# Patient Record
Sex: Female | Born: 1940 | Race: White | Hispanic: No | Marital: Married | Smoking: Never smoker
Health system: Southern US, Community
[De-identification: ages and names within clinical notes are randomized; demographics above are authoritative.]

## PROBLEM LIST (undated history)

## (undated) DIAGNOSIS — M0579 Rheumatoid arthritis with rheumatoid factor of multiple sites without organ or systems involvement: Secondary | ICD-10-CM

## (undated) DIAGNOSIS — E119 Type 2 diabetes mellitus without complications: Secondary | ICD-10-CM

## (undated) DIAGNOSIS — I499 Cardiac arrhythmia, unspecified: Secondary | ICD-10-CM

## (undated) DIAGNOSIS — K56609 Unspecified intestinal obstruction, unspecified as to partial versus complete obstruction: Secondary | ICD-10-CM

## (undated) DIAGNOSIS — H409 Unspecified glaucoma: Secondary | ICD-10-CM

## (undated) DIAGNOSIS — I700 Atherosclerosis of aorta: Secondary | ICD-10-CM

## (undated) DIAGNOSIS — R112 Nausea with vomiting, unspecified: Secondary | ICD-10-CM

## (undated) DIAGNOSIS — N1832 Chronic kidney disease, stage 3b: Secondary | ICD-10-CM

## (undated) DIAGNOSIS — J189 Pneumonia, unspecified organism: Secondary | ICD-10-CM

## (undated) DIAGNOSIS — Z9989 Dependence on other enabling machines and devices: Secondary | ICD-10-CM

## (undated) DIAGNOSIS — N184 Chronic kidney disease, stage 4 (severe): Secondary | ICD-10-CM

## (undated) DIAGNOSIS — J45909 Unspecified asthma, uncomplicated: Secondary | ICD-10-CM

## (undated) DIAGNOSIS — N2581 Secondary hyperparathyroidism of renal origin: Secondary | ICD-10-CM

## (undated) DIAGNOSIS — M519 Unspecified thoracic, thoracolumbar and lumbosacral intervertebral disc disorder: Secondary | ICD-10-CM

## (undated) DIAGNOSIS — N1330 Unspecified hydronephrosis: Secondary | ICD-10-CM

## (undated) DIAGNOSIS — K219 Gastro-esophageal reflux disease without esophagitis: Secondary | ICD-10-CM

## (undated) DIAGNOSIS — R0600 Dyspnea, unspecified: Secondary | ICD-10-CM

## (undated) DIAGNOSIS — Z978 Presence of other specified devices: Secondary | ICD-10-CM

## (undated) DIAGNOSIS — Z9889 Other specified postprocedural states: Secondary | ICD-10-CM

## (undated) DIAGNOSIS — D649 Anemia, unspecified: Secondary | ICD-10-CM

## (undated) DIAGNOSIS — I4510 Unspecified right bundle-branch block: Secondary | ICD-10-CM

## (undated) DIAGNOSIS — M1990 Unspecified osteoarthritis, unspecified site: Secondary | ICD-10-CM

## (undated) DIAGNOSIS — Z974 Presence of external hearing-aid: Secondary | ICD-10-CM

## (undated) DIAGNOSIS — E785 Hyperlipidemia, unspecified: Secondary | ICD-10-CM

## (undated) DIAGNOSIS — E538 Deficiency of other specified B group vitamins: Secondary | ICD-10-CM

## (undated) DIAGNOSIS — N189 Chronic kidney disease, unspecified: Secondary | ICD-10-CM

## (undated) DIAGNOSIS — R339 Retention of urine, unspecified: Secondary | ICD-10-CM

## (undated) DIAGNOSIS — I10 Essential (primary) hypertension: Secondary | ICD-10-CM

## (undated) DIAGNOSIS — T8859XA Other complications of anesthesia, initial encounter: Secondary | ICD-10-CM

## (undated) DIAGNOSIS — C801 Malignant (primary) neoplasm, unspecified: Secondary | ICD-10-CM

## (undated) HISTORY — PX: LUMBAR LAMINECTOMY: SHX95

## (undated) HISTORY — PX: ABDOMINAL HYSTERECTOMY: SHX81

## (undated) HISTORY — PX: KNEE ARTHROSCOPY: SUR90

## (undated) HISTORY — PX: JOINT REPLACEMENT: SHX530

## (undated) HISTORY — PX: FRACTURE SURGERY: SHX138

## (undated) HISTORY — PX: COLON SURGERY: SHX602

## (undated) HISTORY — PX: EYE SURGERY: SHX253

## (undated) HISTORY — DX: Unspecified asthma, uncomplicated: J45.909

## (undated) HISTORY — PX: BACK SURGERY: SHX140

## (undated) HISTORY — DX: Gastro-esophageal reflux disease without esophagitis: K21.9

---

## 1994-10-01 DIAGNOSIS — C189 Malignant neoplasm of colon, unspecified: Secondary | ICD-10-CM

## 1994-10-01 DIAGNOSIS — C801 Malignant (primary) neoplasm, unspecified: Secondary | ICD-10-CM

## 1994-10-01 HISTORY — DX: Malignant (primary) neoplasm, unspecified: C80.1

## 1994-10-01 HISTORY — DX: Malignant neoplasm of colon, unspecified: C18.9

## 2004-10-01 HISTORY — PX: BACK SURGERY: SHX140

## 2004-10-12 ENCOUNTER — Ambulatory Visit: Payer: Self-pay | Admitting: Internal Medicine

## 2005-10-30 ENCOUNTER — Ambulatory Visit: Payer: Self-pay | Admitting: Internal Medicine

## 2006-11-05 ENCOUNTER — Ambulatory Visit: Payer: Self-pay | Admitting: Internal Medicine

## 2007-04-09 ENCOUNTER — Ambulatory Visit: Payer: Self-pay | Admitting: Unknown Physician Specialty

## 2007-06-24 ENCOUNTER — Other Ambulatory Visit: Payer: Self-pay

## 2007-06-24 ENCOUNTER — Ambulatory Visit: Payer: Self-pay | Admitting: Ophthalmology

## 2007-07-02 ENCOUNTER — Ambulatory Visit: Payer: Self-pay | Admitting: Ophthalmology

## 2007-11-18 ENCOUNTER — Ambulatory Visit: Payer: Self-pay | Admitting: Internal Medicine

## 2008-03-03 ENCOUNTER — Ambulatory Visit: Payer: Self-pay | Admitting: Rheumatology

## 2008-03-15 ENCOUNTER — Ambulatory Visit: Payer: Self-pay | Admitting: Unknown Physician Specialty

## 2008-03-15 ENCOUNTER — Other Ambulatory Visit: Payer: Self-pay

## 2008-03-16 ENCOUNTER — Ambulatory Visit: Payer: Self-pay | Admitting: Unknown Physician Specialty

## 2008-09-29 ENCOUNTER — Emergency Department: Payer: Self-pay | Admitting: Emergency Medicine

## 2008-11-18 ENCOUNTER — Ambulatory Visit: Payer: Self-pay | Admitting: Internal Medicine

## 2009-02-23 ENCOUNTER — Ambulatory Visit: Payer: Self-pay | Admitting: Unknown Physician Specialty

## 2009-04-20 ENCOUNTER — Ambulatory Visit: Payer: Self-pay | Admitting: Unknown Physician Specialty

## 2009-04-26 ENCOUNTER — Inpatient Hospital Stay: Payer: Self-pay | Admitting: Unknown Physician Specialty

## 2009-10-01 HISTORY — PX: FRACTURE SURGERY: SHX138

## 2009-10-27 ENCOUNTER — Ambulatory Visit: Payer: Self-pay | Admitting: Pain Medicine

## 2009-11-14 ENCOUNTER — Ambulatory Visit: Payer: Self-pay | Admitting: Pain Medicine

## 2009-11-22 ENCOUNTER — Ambulatory Visit: Payer: Self-pay | Admitting: Internal Medicine

## 2009-12-06 ENCOUNTER — Ambulatory Visit: Payer: Self-pay | Admitting: Pain Medicine

## 2009-12-20 ENCOUNTER — Ambulatory Visit: Payer: Self-pay | Admitting: Pain Medicine

## 2010-01-06 ENCOUNTER — Ambulatory Visit: Payer: Self-pay | Admitting: Pain Medicine

## 2010-02-14 ENCOUNTER — Ambulatory Visit: Payer: Self-pay | Admitting: Internal Medicine

## 2010-06-26 ENCOUNTER — Encounter: Payer: Self-pay | Admitting: Internal Medicine

## 2010-07-01 ENCOUNTER — Encounter: Payer: Self-pay | Admitting: Internal Medicine

## 2010-08-01 ENCOUNTER — Encounter: Payer: Self-pay | Admitting: Internal Medicine

## 2010-11-23 ENCOUNTER — Ambulatory Visit: Payer: Self-pay | Admitting: Internal Medicine

## 2011-03-16 ENCOUNTER — Ambulatory Visit: Payer: Self-pay | Admitting: Unknown Physician Specialty

## 2011-11-29 ENCOUNTER — Ambulatory Visit: Payer: Self-pay | Admitting: Internal Medicine

## 2012-12-02 ENCOUNTER — Ambulatory Visit: Payer: Self-pay | Admitting: Internal Medicine

## 2012-12-17 LAB — URINALYSIS, COMPLETE
Bilirubin,UR: NEGATIVE
Blood: NEGATIVE
Glucose,UR: NEGATIVE mg/dL (ref 0–75)
Nitrite: NEGATIVE
Ph: 5 (ref 4.5–8.0)
Protein: NEGATIVE
RBC,UR: 7 /HPF (ref 0–5)

## 2012-12-17 LAB — COMPREHENSIVE METABOLIC PANEL
Albumin: 4 g/dL (ref 3.4–5.0)
Alkaline Phosphatase: 93 U/L (ref 50–136)
Bilirubin,Total: 0.4 mg/dL (ref 0.2–1.0)
Creatinine: 1.12 mg/dL (ref 0.60–1.30)
EGFR (Non-African Amer.): 50 — ABNORMAL LOW
Potassium: 3.8 mmol/L (ref 3.5–5.1)
SGOT(AST): 20 U/L (ref 15–37)
SGPT (ALT): 23 U/L (ref 12–78)
Sodium: 134 mmol/L — ABNORMAL LOW (ref 136–145)
Total Protein: 7.7 g/dL (ref 6.4–8.2)

## 2012-12-17 LAB — CBC
HGB: 11.9 g/dL — ABNORMAL LOW (ref 12.0–16.0)
MCHC: 33.3 g/dL (ref 32.0–36.0)
Platelet: 238 10*3/uL (ref 150–440)
RBC: 3.87 10*6/uL (ref 3.80–5.20)
RDW: 14.4 % (ref 11.5–14.5)

## 2012-12-18 ENCOUNTER — Inpatient Hospital Stay: Payer: Self-pay | Admitting: Internal Medicine

## 2012-12-20 LAB — COMPREHENSIVE METABOLIC PANEL
Calcium, Total: 7.8 mg/dL — ABNORMAL LOW (ref 8.5–10.1)
Co2: 26 mmol/L (ref 21–32)
Creatinine: 0.65 mg/dL (ref 0.60–1.30)
EGFR (Non-African Amer.): >60
Glucose: 220 mg/dL — ABNORMAL HIGH (ref 65–99)
Potassium: 4 mmol/L (ref 3.5–5.1)
SGOT(AST): 21 U/L (ref 15–37)
SGPT (ALT): 15 U/L (ref 12–78)
Sodium: 134 mmol/L — ABNORMAL LOW (ref 136–145)
Total Protein: 6.2 g/dL — ABNORMAL LOW (ref 6.4–8.2)

## 2012-12-20 LAB — CBC WITH DIFFERENTIAL/PLATELET
Basophil #: 0 10*3/uL (ref 0.0–0.1)
Eosinophil #: 0.2 10*3/uL (ref 0.0–0.7)
HCT: 33.7 % — ABNORMAL LOW (ref 35.0–47.0)
HGB: 10.8 g/dL — ABNORMAL LOW (ref 12.0–16.0)
Lymphocyte #: 1.3 10*3/uL (ref 1.0–3.6)
Lymphocyte %: 16.3 %
MCV: 93 fL (ref 80–100)
Monocyte #: 0.8 x10 3/mm (ref 0.2–0.9)
Monocyte %: 10.1 %
Neutrophil #: 5.8 10*3/uL (ref 1.4–6.5)
Neutrophil %: 71 %
Platelet: 183 10*3/uL (ref 150–440)
RDW: 13.9 % (ref 11.5–14.5)
WBC: 8.1 10*3/uL (ref 3.6–11.0)

## 2012-12-21 LAB — COMPREHENSIVE METABOLIC PANEL
Albumin: 3 g/dL — ABNORMAL LOW (ref 3.4–5.0)
Alkaline Phosphatase: 78 U/L (ref 50–136)
Anion Gap: 4 — ABNORMAL LOW (ref 7–16)
Bilirubin,Total: 0.5 mg/dL (ref 0.2–1.0)
Calcium, Total: 8 mg/dL — ABNORMAL LOW (ref 8.5–10.1)
Creatinine: 0.58 mg/dL — ABNORMAL LOW (ref 0.60–1.30)
EGFR (African American): >60
EGFR (Non-African Amer.): >60
SGOT(AST): 20 U/L (ref 15–37)
SGPT (ALT): 16 U/L (ref 12–78)
Sodium: 140 mmol/L (ref 136–145)

## 2012-12-21 LAB — CBC WITH DIFFERENTIAL/PLATELET
Eosinophil #: 0.3 10*3/uL (ref 0.0–0.7)
Eosinophil %: 3.2 %
HGB: 11.2 g/dL — ABNORMAL LOW (ref 12.0–16.0)
Lymphocyte #: 1.7 10*3/uL (ref 1.0–3.6)
Lymphocyte %: 20.7 %
MCH: 31.9 pg (ref 26.0–34.0)
MCV: 93 fL (ref 80–100)
Platelet: 185 10*3/uL (ref 150–440)
RBC: 3.51 10*6/uL — ABNORMAL LOW (ref 3.80–5.20)
RDW: 14.1 % (ref 11.5–14.5)

## 2012-12-22 LAB — CBC WITH DIFFERENTIAL/PLATELET
Basophil %: 0.8 %
Eosinophil #: 0.3 10*3/uL (ref 0.0–0.7)
Eosinophil %: 2.7 %
HCT: 33.6 % — ABNORMAL LOW (ref 35.0–47.0)
Lymphocyte #: 1.6 10*3/uL (ref 1.0–3.6)
Lymphocyte %: 16.7 %
MCH: 32.1 pg (ref 26.0–34.0)
MCV: 93 fL (ref 80–100)
Monocyte %: 9.5 %
Neutrophil #: 6.8 10*3/uL — ABNORMAL HIGH (ref 1.4–6.5)
Neutrophil %: 70.3 %
Platelet: 213 10*3/uL (ref 150–440)
RBC: 3.63 10*6/uL — ABNORMAL LOW (ref 3.80–5.20)

## 2012-12-22 LAB — COMPREHENSIVE METABOLIC PANEL
Albumin: 3.3 g/dL — ABNORMAL LOW (ref 3.4–5.0)
Alkaline Phosphatase: 83 U/L (ref 50–136)
Anion Gap: 5 — ABNORMAL LOW (ref 7–16)
Calcium, Total: 8.5 mg/dL (ref 8.5–10.1)
Chloride: 108 mmol/L — ABNORMAL HIGH (ref 98–107)
Co2: 25 mmol/L (ref 21–32)
EGFR (Non-African Amer.): >60
Glucose: 161 mg/dL — ABNORMAL HIGH (ref 65–99)
Osmolality: 278 (ref 275–301)
Potassium: 4.3 mmol/L (ref 3.5–5.1)
SGOT(AST): 23 U/L (ref 15–37)
Sodium: 138 mmol/L (ref 136–145)
Total Protein: 6.6 g/dL (ref 6.4–8.2)

## 2013-05-04 DIAGNOSIS — R350 Frequency of micturition: Secondary | ICD-10-CM | POA: Insufficient documentation

## 2013-05-04 DIAGNOSIS — R339 Retention of urine, unspecified: Secondary | ICD-10-CM | POA: Insufficient documentation

## 2013-05-04 DIAGNOSIS — N3020 Other chronic cystitis without hematuria: Secondary | ICD-10-CM | POA: Insufficient documentation

## 2013-06-24 DIAGNOSIS — M4120 Other idiopathic scoliosis, site unspecified: Secondary | ICD-10-CM | POA: Insufficient documentation

## 2013-08-05 DIAGNOSIS — R351 Nocturia: Secondary | ICD-10-CM | POA: Insufficient documentation

## 2013-09-14 DIAGNOSIS — D414 Neoplasm of uncertain behavior of bladder: Secondary | ICD-10-CM | POA: Insufficient documentation

## 2013-11-18 ENCOUNTER — Other Ambulatory Visit: Payer: Self-pay | Admitting: Unknown Physician Specialty

## 2013-11-18 LAB — CLOSTRIDIUM DIFFICILE(ARMC)

## 2013-11-21 LAB — STOOL CULTURE

## 2013-11-23 ENCOUNTER — Ambulatory Visit: Payer: Self-pay | Admitting: Unknown Physician Specialty

## 2013-12-01 ENCOUNTER — Ambulatory Visit: Payer: Self-pay | Admitting: Urology

## 2013-12-01 DIAGNOSIS — I10 Essential (primary) hypertension: Secondary | ICD-10-CM

## 2013-12-01 LAB — CBC WITH DIFFERENTIAL/PLATELET
BASOS ABS: 0.1 10*3/uL (ref 0.0–0.1)
BASOS PCT: 1.2 %
EOS ABS: 0.3 10*3/uL (ref 0.0–0.7)
Eosinophil %: 4 %
HCT: 35.4 % (ref 35.0–47.0)
HGB: 11.7 g/dL — ABNORMAL LOW (ref 12.0–16.0)
Lymphocyte #: 1.9 10*3/uL (ref 1.0–3.6)
Lymphocyte %: 23.8 %
MCH: 30.9 pg (ref 26.0–34.0)
MCHC: 33.1 g/dL (ref 32.0–36.0)
MCV: 93 fL (ref 80–100)
MONOS PCT: 7.6 %
Monocyte #: 0.6 x10 3/mm (ref 0.2–0.9)
Neutrophil #: 5 10*3/uL (ref 1.4–6.5)
Neutrophil %: 63.4 %
PLATELETS: 220 10*3/uL (ref 150–440)
RBC: 3.8 10*6/uL (ref 3.80–5.20)
RDW: 13.4 % (ref 11.5–14.5)
WBC: 7.9 10*3/uL (ref 3.6–11.0)

## 2013-12-01 LAB — BASIC METABOLIC PANEL
ANION GAP: 8 (ref 7–16)
BUN: 14 mg/dL (ref 7–18)
CHLORIDE: 100 mmol/L (ref 98–107)
CREATININE: 0.95 mg/dL (ref 0.60–1.30)
Calcium, Total: 9.5 mg/dL (ref 8.5–10.1)
Co2: 27 mmol/L (ref 21–32)
EGFR (African American): >60
EGFR (Non-African Amer.): >60
Glucose: 115 mg/dL — ABNORMAL HIGH (ref 65–99)
OSMOLALITY: 271 (ref 275–301)
Potassium: 4.2 mmol/L (ref 3.5–5.1)
Sodium: 135 mmol/L — ABNORMAL LOW (ref 136–145)

## 2013-12-03 ENCOUNTER — Ambulatory Visit: Payer: Self-pay | Admitting: Internal Medicine

## 2013-12-08 ENCOUNTER — Ambulatory Visit: Payer: Self-pay | Admitting: Urology

## 2013-12-09 LAB — PATHOLOGY REPORT

## 2014-01-16 DIAGNOSIS — I10 Essential (primary) hypertension: Secondary | ICD-10-CM | POA: Insufficient documentation

## 2014-01-16 DIAGNOSIS — M519 Unspecified thoracic, thoracolumbar and lumbosacral intervertebral disc disorder: Secondary | ICD-10-CM | POA: Insufficient documentation

## 2014-01-16 DIAGNOSIS — C189 Malignant neoplasm of colon, unspecified: Secondary | ICD-10-CM | POA: Insufficient documentation

## 2014-01-25 ENCOUNTER — Ambulatory Visit: Payer: Self-pay | Admitting: Unknown Physician Specialty

## 2014-01-27 LAB — PATHOLOGY REPORT

## 2014-03-03 DIAGNOSIS — E539 Vitamin B deficiency, unspecified: Secondary | ICD-10-CM | POA: Insufficient documentation

## 2014-04-01 DIAGNOSIS — M179 Osteoarthritis of knee, unspecified: Secondary | ICD-10-CM | POA: Insufficient documentation

## 2014-04-01 DIAGNOSIS — M1710 Unilateral primary osteoarthritis, unspecified knee: Secondary | ICD-10-CM | POA: Insufficient documentation

## 2014-04-29 DIAGNOSIS — N3289 Other specified disorders of bladder: Secondary | ICD-10-CM | POA: Insufficient documentation

## 2014-06-23 ENCOUNTER — Ambulatory Visit: Payer: Self-pay | Admitting: General Practice

## 2014-06-23 LAB — URINALYSIS, COMPLETE
BILIRUBIN, UR: NEGATIVE
Glucose,UR: NEGATIVE mg/dL (ref 0–75)
KETONE: NEGATIVE
NITRITE: NEGATIVE
PH: 5 (ref 4.5–8.0)
Specific Gravity: 1.011 (ref 1.003–1.030)
Squamous Epithelial: 8
WBC UR: 969 /HPF (ref 0–5)

## 2014-06-23 LAB — BASIC METABOLIC PANEL
Anion Gap: 7 (ref 7–16)
BUN: 14 mg/dL (ref 7–18)
Calcium, Total: 9.3 mg/dL (ref 8.5–10.1)
Chloride: 103 mmol/L (ref 98–107)
Co2: 27 mmol/L (ref 21–32)
Creatinine: 0.83 mg/dL (ref 0.60–1.30)
EGFR (African American): >60
Glucose: 111 mg/dL — ABNORMAL HIGH (ref 65–99)
Osmolality: 275 (ref 275–301)
Potassium: 4.3 mmol/L (ref 3.5–5.1)
SODIUM: 137 mmol/L (ref 136–145)

## 2014-06-23 LAB — APTT: Activated PTT: 32 secs (ref 23.6–35.9)

## 2014-06-23 LAB — MRSA PCR SCREENING

## 2014-06-23 LAB — PROTIME-INR
INR: 0.9
PROTHROMBIN TIME: 12.3 s (ref 11.5–14.7)

## 2014-06-23 LAB — CBC
HCT: 33.7 % — ABNORMAL LOW (ref 35.0–47.0)
HGB: 10.5 g/dL — AB (ref 12.0–16.0)
MCH: 28.8 pg (ref 26.0–34.0)
MCHC: 31.1 g/dL — ABNORMAL LOW (ref 32.0–36.0)
MCV: 92 fL (ref 80–100)
Platelet: 187 10*3/uL (ref 150–440)
RBC: 3.65 10*6/uL — ABNORMAL LOW (ref 3.80–5.20)
RDW: 13.6 % (ref 11.5–14.5)
WBC: 6.3 10*3/uL (ref 3.6–11.0)

## 2014-06-23 LAB — SEDIMENTATION RATE: Erythrocyte Sed Rate: 21 mm/hr (ref 0–30)

## 2014-06-24 LAB — URINE CULTURE

## 2014-07-07 ENCOUNTER — Inpatient Hospital Stay: Payer: Self-pay | Admitting: General Practice

## 2014-07-08 LAB — BASIC METABOLIC PANEL
ANION GAP: 8 (ref 7–16)
BUN: 11 mg/dL (ref 7–18)
CHLORIDE: 104 mmol/L (ref 98–107)
CO2: 26 mmol/L (ref 21–32)
Calcium, Total: 7.6 mg/dL — ABNORMAL LOW (ref 8.5–10.1)
Creatinine: 0.82 mg/dL (ref 0.60–1.30)
EGFR (African American): >60
EGFR (Non-African Amer.): >60
Glucose: 108 mg/dL — ABNORMAL HIGH (ref 65–99)
Osmolality: 276 (ref 275–301)
Potassium: 4.1 mmol/L (ref 3.5–5.1)
SODIUM: 138 mmol/L (ref 136–145)

## 2014-07-08 LAB — PLATELET COUNT: Platelet: 178 10*3/uL (ref 150–440)

## 2014-07-08 LAB — HEMOGLOBIN: HGB: 8.4 g/dL — ABNORMAL LOW (ref 12.0–16.0)

## 2014-07-09 LAB — HEMOGLOBIN: HGB: 8.7 g/dL — ABNORMAL LOW (ref 12.0–16.0)

## 2014-07-09 LAB — BASIC METABOLIC PANEL
Anion Gap: 7 (ref 7–16)
BUN: 8 mg/dL (ref 7–18)
Calcium, Total: 8 mg/dL — ABNORMAL LOW (ref 8.5–10.1)
Chloride: 102 mmol/L (ref 98–107)
Co2: 27 mmol/L (ref 21–32)
Creatinine: 0.74 mg/dL (ref 0.60–1.30)
EGFR (Non-African Amer.): >60
GLUCOSE: 150 mg/dL — AB (ref 65–99)
OSMOLALITY: 273 (ref 275–301)
POTASSIUM: 3.8 mmol/L (ref 3.5–5.1)
Sodium: 136 mmol/L (ref 136–145)

## 2014-07-09 LAB — PLATELET COUNT: Platelet: 158 10*3/uL (ref 150–440)

## 2014-10-18 DIAGNOSIS — Z85038 Personal history of other malignant neoplasm of large intestine: Secondary | ICD-10-CM | POA: Insufficient documentation

## 2014-11-16 ENCOUNTER — Ambulatory Visit: Payer: Self-pay | Admitting: General Practice

## 2014-11-22 ENCOUNTER — Ambulatory Visit: Payer: Self-pay | Admitting: General Practice

## 2014-12-01 ENCOUNTER — Ambulatory Visit: Admit: 2014-12-01 | Disposition: A | Discharge status: Home / Self Care | Payer: Self-pay | Admitting: General Practice

## 2014-12-06 ENCOUNTER — Ambulatory Visit: Payer: Self-pay | Admitting: Internal Medicine

## 2015-01-04 ENCOUNTER — Ambulatory Visit
Admit: 2015-01-04 | Disposition: A | Discharge status: Home / Self Care | Payer: Self-pay | Attending: General Practice | Admitting: General Practice

## 2015-01-04 LAB — URINALYSIS, COMPLETE
Bilirubin,UR: NEGATIVE
Blood: NEGATIVE
GLUCOSE, UR: NEGATIVE mg/dL (ref 0–75)
Ketone: NEGATIVE
NITRITE: NEGATIVE
Ph: 5 (ref 4.5–8.0)
Protein: NEGATIVE
RBC,UR: 2 /HPF (ref 0–5)
Specific Gravity: 1.013 (ref 1.003–1.030)
Squamous Epithelial: 1
WBC UR: 45 /HPF (ref 0–5)

## 2015-01-04 LAB — BASIC METABOLIC PANEL
Anion Gap: 10 (ref 7–16)
BUN: 16 mg/dL
CALCIUM: 9.7 mg/dL
CO2: 26 mmol/L
Chloride: 102 mmol/L
Creatinine: 0.81 mg/dL
EGFR (African American): >60
Glucose: 113 mg/dL — ABNORMAL HIGH
Potassium: 4.6 mmol/L
Sodium: 138 mmol/L

## 2015-01-04 LAB — MRSA PCR SCREENING

## 2015-01-05 LAB — HEMOGLOBIN A1C: HEMOGLOBIN A1C: 6 %

## 2015-01-06 LAB — URINE CULTURE

## 2015-01-12 ENCOUNTER — Inpatient Hospital Stay
Admit: 2015-01-12 | Disposition: A | Discharge status: Home / Self Care | Payer: Self-pay | Attending: General Practice | Admitting: General Practice

## 2015-01-13 LAB — BASIC METABOLIC PANEL
Anion Gap: 8 (ref 7–16)
BUN: 11 mg/dL
CHLORIDE: 104 mmol/L
CREATININE: 0.66 mg/dL
Calcium, Total: 8.7 mg/dL — ABNORMAL LOW
Co2: 26 mmol/L
EGFR (African American): >60
Glucose: 141 mg/dL — ABNORMAL HIGH
POTASSIUM: 4.1 mmol/L
SODIUM: 138 mmol/L

## 2015-01-13 LAB — PLATELET COUNT: Platelet: 172 10*3/uL (ref 150–440)

## 2015-01-13 LAB — HEMOGLOBIN: HGB: 10.8 g/dL — AB (ref 12.0–16.0)

## 2015-01-14 LAB — BASIC METABOLIC PANEL
ANION GAP: 5 — AB (ref 7–16)
BUN: 12 mg/dL
CREATININE: 0.66 mg/dL
Calcium, Total: 8.7 mg/dL — ABNORMAL LOW
Chloride: 100 mmol/L — ABNORMAL LOW
Co2: 27 mmol/L
GLUCOSE: 189 mg/dL — AB
Potassium: 3.9 mmol/L
SODIUM: 132 mmol/L — AB

## 2015-01-14 LAB — HEMOGLOBIN: HGB: 10.4 g/dL — ABNORMAL LOW (ref 12.0–16.0)

## 2015-01-14 LAB — PLATELET COUNT: PLATELETS: 167 10*3/uL (ref 150–440)

## 2015-01-21 NOTE — H&P (Signed)
Subjective/Chief Complaint abdominal pain for one day   History of Present Illness This pt complains of nausea and vomiting for about 12 hours which started yesterday,pt moved bowels yesterday.complains of pain in the left lower quadrent .she had colon resection for cancer about 15 years ago and has been having colonoscopies and last colonoscopy was in 2012 and was normal  pt had ct scan done in emergency room last night showed distended small bowel and transition point in the small bowel  pt denies any chest pain or fever or chills   Past History colon resection  bilateral oophrectomy and hystrectomy hypertension diabetes mellitis and appendectomy   Past Medical Health Hypertension, Diabetes Mellitus, Cancer   Primary Physician [name]   Past Med/Surgical Hx:  Hypertension:   Rheumatoid Arthritis:   Appendectomy:   Colon Resection:   Hysterectomy - Partial:   Back Surgery:   ALLERGIES:  Remicade: Resp. Distress  Methotrexate: Headaches  Univasc: Headaches  Naprosyn: Rash  Percocet: Itching  Ibuprofen: Other  Indocin: Other  HOME MEDICATIONS: Medication Instructions Status  amlodipine besybite 5 mg. 1 tab(s) po once a day  Active  Advair Diskus 250 mcg-50 mcg inhalation powder 1 puff(s)   as needed   Active  multivitamin 1 tab(s)  once a day  Active  Os cal D 3 $R'1000mg'OY$  1 tab(s)  once a day  Active  simvastatin 20 mg oral tablet 1 tab(s)  once a day (at bedtime)  Active  HCTZ $Rem'25mg'SSuU$  1 tab(s)  once a day  Active  lisinopril 20 mg oral tablet 1 tab(s)  once a day  Active  potassium chloride 20 mEq oral tablet, extended release 1 tab(s)  once a day  Active  leflunomide 10 mg oral tablet 1 tab(s)  once a day  Active  metoprolol succinate 50 mg oral tablet, extended release 1 tab(s)  once a day  Active  sulfasalazine 500 mg oral tablet 2 tab(s)  2 times a day  Active  Vitamin C 1000 mg oral tablet 1  orally 2 times a day  Active   Family and Social History:  Family  History Non-Contributory   Social History negative tobacco, negative ETOH   Place of Living Home   Review of Systems:  Subjective/Chief Complaint abdominal pain nausea   Fever/Chills No   Cough No   Sputum No   Abdominal Pain Yes   Diarrhea No   Constipation No   Nausea/Vomiting Yes   SOB/DOE No   Chest Pain No   Telemetry Reviewed NSR   Dysuria No   Tolerating Diet No   Medications/Allergies Reviewed Medications/Allergies reviewed   Physical Exam:  GEN well nourished, obese   HEENT PERRL   NECK supple   RESP normal resp effort  clear BS  no use of accessory muscles   CARD regular rate  no murmur  no thrills  No LE edema  no JVD   ABD positive tenderness  denies Flank Tenderness  no liver/spleen enlargement  no hernia  soft  distended  normal BS  no Abdominal Bruits  no Adominal Mass  tenderness left side of abdomen no gayrding   GU no superpubic tenderness   LYMPH negative neck, negative axillae   EXTR negative cyanosis/clubbing, negative edema   SKIN normal to palpation, No rashes, No ulcers, skin turgor good   NEURO moving all extremities   PSYCH alert, good insight   Additional Comments This pt has bowel obstruction and probably needs surgery.She wants Dr  Tamala Julian only i will let him know today   Lab Results: Hepatic:  19-Mar-14 19:56   Bilirubin, Total 0.4  Alkaline Phosphatase 93  SGPT (ALT) 23  SGOT (AST) 20  Total Protein, Serum 7.7  Albumin, Serum 4.0  Routine Chem:  19-Mar-14 19:56   Glucose, Serum  167  BUN  24  Creatinine (comp) 1.12  Sodium, Serum  134  Potassium, Serum 3.8  Chloride, Serum 100  CO2, Serum 24  Calcium (Total), Serum 9.3  Osmolality (calc) 276  eGFR (African American)  58  eGFR (Non-African American)  50 (eGFR values <52mL/min/1.73 m2 may be an indication of chronic kidney disease (CKD). Calculated eGFR is useful in patients with stable renal function. The eGFR calculation will not be reliable in acutely  ill patients when serum creatinine is changing rapidly. It is not useful in  patients on dialysis. The eGFR calculation may not be applicable to patients at the low and high extremes of body sizes, pregnant women, and vegetarians.)  Anion Gap 10  Routine UA:  19-Mar-14 19:56   Color (UA) Amber  Clarity (UA) Hazy  Glucose (UA) Negative  Bilirubin (UA) Negative  Ketones (UA) Negative  Specific Gravity (UA) 1.018  Blood (UA) Negative  pH (UA) 5.0  Protein (UA) Negative  Nitrite (UA) Negative  Leukocyte Esterase (UA) 3+ (Result(s) reported on 17 Dec 2012 at 08:29PM.)  RBC (UA) 7 /HPF  WBC (UA) 56 /HPF  Bacteria (UA) NONE SEEN  Epithelial Cells (UA) 7 /HPF  Mucous (UA) PRESENT (Result(s) reported on 17 Dec 2012 at 08:29PM.)  Routine Hem:  19-Mar-14 19:56   WBC (CBC)  12.4  RBC (CBC) 3.87  Hemoglobin (CBC)  11.9  Hematocrit (CBC) 35.7  Platelet Count (CBC) 238 (Result(s) reported on 17 Dec 2012 at 08:20PM.)  MCV 92  MCH 30.7  MCHC 33.3  RDW 14.4    Assessment/Admission Diagnosis small bowel obstruction due to adhesions   Plan notify Dr Tamala Julian today about admission   Electronic Signatures: Vella Kohler (MD)  (Signed 20-Mar-14 06:55)  Authored: CHIEF COMPLAINT and HISTORY, PAST MEDICAL/SURGIAL HISTORY, ALLERGIES, HOME MEDICATIONS, FAMILY AND SOCIAL HISTORY, REVIEW OF SYSTEMS, PHYSICAL EXAM, LABS, ASSESSMENT AND PLAN   Last Updated: 20-Mar-14 06:55 by Vella Kohler (MD)

## 2015-01-21 NOTE — Op Note (Signed)
PATIENT NAME:  Stephanie [name], Stephanie [name] MR#:  [id number] DATE OF BIRTH:  27-Aug-1941  DATE OF PROCEDURE:  12/18/2012  PREOPERATIVE DIAGNOSIS: Small bowel obstruction.   POSTOPERATIVE DIAGNOSIS: Small bowel obstruction.   PROCEDURE: Laparotomy with enterolysis.   SURGEON: Rochel Brome MD   ANESTHESIA: General.   INDICATION: This 74 year old female came in with left lower abdominal pain and abdominal distention, vomiting, and had CT findings of a small bowel obstruction. Had mild left lower quadrant tenderness, marked abdominal distention, and surgery was recommended for definitive treatment.   DESCRIPTION OF PROCEDURE: The patient was placed on the operating table in the supine position under general endotracheal anesthesia. The circulating nurse inserted a Foley urinary catheter with Betadine preparation of the perineum, draining a clear yellow urine. The abdomen was prepared with ChloraPrep and draped in a sterile manner.   A lower abdominal midline incision was made at the site of an old scar, carried out just to the left of the umbilicus, carried down through the subcutaneous tissues. Numerous small bleeding points were cauterized. The midline fascia was incised and the peritoneal cavity was opened.   There were multiple adhesions found between the omentum and the anterior abdominal wall. These multiple adhesions were taken down with blunt and sharp dissection and use of electrocautery. There was ascites, and aspirated approximately 400 mL of a straw-colored ascites. The small bowel was found to be markedly dilated, and also there were distal loops of ileum which were just normal size. The small bowel was followed, and I found a point of obstruction in the mid aspect of the ileum, in which the bowel was markedly dilated above this point and collapsed below this point. There was a band of scar tissue which was excised, removing a segment of scar tissue about 4 cm in length.   I could now see that fluid  from the upper part of the small bowel could flow into the ileum. The bowel was followed proximally up to the ligament of Treitz, and all of this bowel was markedly distended. Also, the bowel was followed distally to the ileocecal valve. Additional exploration revealed there was no palpable mass within the liver. No palpable gallstones. The nasogastric tube was in satisfactory position in the stomach. There was no palpable pelvic mass. There was just a moderate amount of stool within the left colon.   Next, the intestinal contents were returned to the abdominal cavity. The omentum was brought beneath the wound. The midline fascia was closed with interrupted 0 Maxon figure-of-eight sutures. The skin was closed with clips. Dressings were applied with paper tape. The patient tolerated surgery satisfactorily and was then carried to the recovery room for postoperative care.     ____________________________ J. Rochel Brome, MD jws:dm D: 12/18/2012 15:18:00 ET T: 12/18/2012 15:28:45 ET JOB#: [id number]  cc: Loreli Dollar, MD, <Dictator> Loreli Dollar MD ELECTRONICALLY SIGNED 12/19/2012 19:28

## 2015-01-21 NOTE — Consult Note (Signed)
PATIENT NAME:  Stephanie [name], Stephanie [name] MR#:  [id number] DATE OF BIRTH:  [birth year]  DATE OF CONSULTATION:  12/18/2012  REFERRING PHYSICIAN:  Rochel Brome, MD CONSULTING PHYSICIAN:  Rusty Aus, MD  HISTORY OF PRESENT ILLNESS:  This is a 74 year old female who presents on consultation from Dr. Tamala Julian for small bowel obstruction. The patient was in her usual state of health until this morning when she started having nausea and vomiting and came to the ED where CT showed partial small bowel obstruction. She notes no chest pain. She has no history of coronary artery disease, CHF, orthopnea or PND.  PAST MEDICAL HISTORY: Hypertension, rheumatoid arthritis.   PAST SURGICAL HISTORY:  Partial colectomy, hysterectomy, appendectomy.   ALLERGIES: REMICADE, METHOTREXATE, NAPROSYN, PERCOCET, IBUPROFEN, INDOCIN.   HOME MEDICATIONS: 1.  Norvasc 5 mg daily.  2.  Advair 250/50 b.i.d.  3.  Simvastatin 20 mg at bedtime.  4.  Lisinopril 20 mg daily.  5.  Potassium chloride 20 milliequivalents daily. 6.  Leflunomide 10 mg daily.  7.  Toprol-XL 50 mg daily. 8.  Sulfasalazine 500 mg 2 b.i.d.  9.  Vitamin C daily.   SOCIAL HISTORY: No smoking or alcohol.   FAMILY HISTORY: Noncontributory.   REVIEW OF SYSTEMS:  As above, otherwise negative.   PHYSICAL EXAMINATION: VITAL SIGNS: Blood pressure is 156/87, afebrile, and pulse 77.  LUNGS: Clear to auscultation and percussion.  HEART: Regular rhythm.  ABDOMEN: Moderately distended, hyperdynamic bowel sounds.  NEUROLOGICAL: Nonfocal.  EXTREMITIES: No edema.  IMPRESSION: A 74 year old female with rheumatoid arthritis now with partial small bowel obstruction.  PLAN: Surgical risk is low. Can hold rheumatoid medicines at this point. Hopefully with bowel rest things may improve, but will defer to surgery, as far as surgical therapy. ____________________________ Rusty Aus, MD mfm:sb D: 12/18/2012 08:24:03 ET T: 12/18/2012 09:11:38 ET JOB#: [id number]  cc: Rusty Aus, MD, <Dictator> J. Rochel Brome, MD [place] MD ELECTRONICALLY SIGNED 12/22/2012 8:08

## 2015-01-21 NOTE — Discharge Summary (Signed)
PATIENT NAME:  Stephanie [name], Stephanie [name] MR#:  [id number] DATE OF BIRTH:  1941-05-15  DATE OF ADMISSION:  12/18/2012 DATE OF DISCHARGE:  12/25/2012  HISTORY OF PRESENT ILLNESS:  This 74 year old female was admitted emergently with nausea and vomiting and cessation of bowel movements.  She was initially evaluated in the Emergency Room and was admitted on to the surgical service, had findings of small bowel obstruction.   PAST MEDICAL HISTORY:   1.  Had had a colon resection for cancer approximately 15 years ago.  Did have last colonoscopy 2012 which was normal.  2.  The patient also had a previous bilateral oophorectomy and hysterectomy.  3.  She has history of hypertension.  4.  Diabetes.   Details of past medical history and medicines are recorded on the typed history and physical.    She was admitted by Dr. Phylis Bougie and referred to me later in the day and I saw her and evaluated, reviewed her CT findings of small bowel obstruction.    She was carried to the operating room where she had a laparotomy with enterolysis to correct small bowel obstruction.  She was continued with nasogastric suction and IV fluids and did make gradual progress.  With time she began to pass gas.  Her nasogastric tube was removed on March 25th and she was began on clear liquids.  She did progress with her diet making satisfactory progress and subsequently was in satisfactory condition for discharge on March 27th.   FINAL DIAGNOSES:  Small bowel obstruction.   OPERATION:  Laparotomy with enterolysis.   DISCHARGE INSTRUCTIONS:  Wound care instructions given and plans made to follow up in the office.    ____________________________ J. Rochel Brome, MD jws:ea D: 01/07/2013 18:55:00 ET T: 01/08/2013 06:15:58 ET JOB#: [id number]  cc: Loreli Dollar, MD, <Dictator> Loreli Dollar MD ELECTRONICALLY SIGNED 01/14/2013 21:34

## 2015-01-22 NOTE — Discharge Summary (Signed)
PATIENT NAME:  Stephanie [name], GARCIAGONZALEZ MR#:  [id number] DATE OF BIRTH:  1941/05/25  DATE OF ADMISSION:  07/07/2014 DATE OF DISCHARGE:  07/10/2014  ADMITTING DIAGNOSIS: Degenerative arthrosis of the right knee.   DISCHARGE DIAGNOSIS: Degenerative arthrosis of the right knee.   HISTORY: The patient is a 74 year old who has been followed at [facility] for progression of right knee pain. The patient has a long history of progressive bilateral knee pain. The left knee has become more painful. She reports progressive valgus deformity of the right knee with episodes of near giving way. She occasionally was noted to have some soft tissue swelling of the knee. She had localized most of the pain along the lateral aspect of the knee. At the time of surgery, she was not using any ambulatory aid. The right knee pain had progressed to the point that it was significantly interfering with her activities of daily living. X-rays taken in [place] showed narrowing of the lateral cartilage space with bone-on-bone articulation being noted as well as being associated with valgus alignment. Osteophyte formation as well as subchondral sclerosis was noted. After discussion of the risks and benefits of surgical intervention, the patient expressed her understanding of the risks and benefits and agreed for plans for surgical intervention.   PROCEDURE: Right total knee arthroplasty using computer-assisted navigation.   ANESTHESIA: General.   SOFT TISSUE RELEASE: Anterior cruciate ligament, posterior cruciate ligament, deep medial collateral ligament, patellofemoral ligament, posterolateral corner.   IMPLANTS UTILIZED: DePuy PFC Sigma size 4 narrow posterior stabilized femoral component (cemented), size 4 MBT tibial component (cemented), 35 mm three-pegged oval dome patella (cemented), and a 15 mm stabilized rotating platform polyethylene insert. Gentamicin bone cement was utilized secondary to her rheumatoid  arthritis and diabetes.   HOSPITAL COURSE: The patient tolerated the procedure very well. She had no complications. She was then taken to PAC-U where she was stabilized and then transferred to the orthopedic floor. She began receiving anticoagulation therapy of Lovenox 30 mg subcutaneous every 12 hours per anesthesia and pharmacy protocol. She was fitted with TED stockings bilaterally. These were allowed to be removed 1 hour per 8 hour shift. The right one was applied on day 2 following removal of the Hemovac and dressing change. The patient was also fitted with the AV-I compression foot pumps bilaterally set at 80 mmHg. Her calves have been nontender. There has been no evidence of any DVTs. Negative Homans sign. Heels were elevated off the bed using rolled towels.   The patient has denied any chest pain or shortness of breath. Vital signs have been stable. She has been afebrile. Hemodynamically she was stable and no transfusions were given other than the Autovac transfusion given the first 6 hours postoperatively.   The patient began receiving physical therapy for gait training and transfers. She has done very well. Upon being discharged from the hospital was ambulating greater than 200 feet. She was able to go up and down 4 sets of steps. She was independent with bed to chair transfers. Occupational therapy was also initiated on day 1 for ADL and assistive devices.   Due to the valgus deformity, she was watched very closely for peroneal injury. She has had no evidence of any weakness or dorsiflexion of the foot. The peroneal nerve has been intact and there have been no complications.   The patient's IV, Foley and Hemovac were DC'd on day 2 along with the dressing change. The wound was free of any drainage or signs  of infection. The Polar Care was reapplied to the surgical leg maintaining a temperature of 40 to 50 degrees Fahrenheit.   DISPOSITION: The patient is being discharged to home in improved  stable condition.   DISCHARGE INSTRUCTIONS: She will continue weightbearing as tolerated. Continue using a walker until cleared by physical therapy to go to a quad cane. She will receive home health PT. Elevate the lower extremities. Continue Polar Care maintaining a temperature of 40 to 50 degrees Fahrenheit. She was instructed in wound care. Continue with TED stockings. These are to be worn during the day but may be removed at night. She is placed on an ADA diet. She has a follow-up appointment in [facility] on October 20th at 8:45. She is to call the clinic sooner if any temperatures of 101.5 or greater or excessive bleeding.  DRUG ALLERGIES: IBUPROFEN, INDOMETHACIN, METHOTREXATE, NAPROSYN, PERCOCET, REMICADE, UNIVASC.   DISCHARGE MEDICATIONS: The patient was given a prescription for Lovenox 40 mg subcutaneously daily for 14 days and then discontinue and begin taking  one 81 mg enteric-coated aspirin unless contraindicated, also a prescription for Nucynta 50 mg, 50 to 100 mg every 4 to 6 hours p.r.n. for pain, and Tramadol 50 to 100 mg every 4 to 6 hours p.r.n. for pain.   PAST MEDICAL HISTORY:  Rheumatoid arthritis, lumbar disk disease, hyperlipidemia, hypertension, B12 deficiency, colon cancer, hyperglycemia, gastritis, diabetes mellitus, small bowel obstruction, glaucoma.  ____________________________ Vance Peper, PA jrw:[id number] D: 07/09/2014 08:18:00 ET T: 07/09/2014 10:39:05 ET JOB#: [id number]  cc: Vance Peper, PA, <Dictator> Jade Burkard PA ELECTRONICALLY SIGNED 07/13/2014 8:12

## 2015-01-22 NOTE — Op Note (Signed)
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   PROCEDURE PERFORMED: Right total knee arthroplasty using computer-assisted navigation.   SURGEON: Skip Estimable, MD   ASSISTANT: Vance Peper, PA (required to maintain retraction throughout the procedure).   ANESTHESIA: General.   ESTIMATED BLOOD LOSS: 50 mL.   FLUIDS REPLACED: 1800 mL of crystalloid.   TOURNIQUET TIME: 112 minutes.   DRAINS: Two medium drains to reinfusion system.   SOFT TISSUE RELEASES: Anterior cruciate ligament, posterior cruciate ligament, deep medial collateral ligament, patellofemoral ligament, posterolateral corner.   IMPLANTS UTILIZED: DePuy PFC Sigma size 4 N (narrow) posterior stabilized femoral component (cemented), size 4 MBT tibial component (cemented), 35 mm 3 peg oval dome patella (cemented), and a 15 mm stabilized rotating platform polyethylene insert. Gentamicin bone cement was utilized.   INDICATIONS FOR SURGERY: The patient is a 73 year old female with a long history of rheumatoid arthritis and progressive right knee pain. X-rays demonstrated severe degenerative changes in tricompartmental fashion with valgus deformity. After discussion of the risks and benefits of surgical intervention, the patient expressed understanding of the risks, benefits, and agreed with plans for surgical intervention.   PROCEDURE IN DETAIL: The patient was brought to the operating room and after adequate general anesthesia achieved, a tourniquet was placed on the patient's upper right thigh. The patient's right knee and leg were cleaned and prepped with alcohol and DuraPrep and draped in the usual sterile fashion. A "timeout" was performed as per usual protocol. The  right lower extremity was exsanguinated using an Esmarch, and the tourniquet was inflated to 300 mmHg.   An anterior and longitudinal incision was made followed by a standard mid vastus approach. A large effusion was evacuated. The deep fibers of the medial collateral ligament were elevated in a subperiosteal fashion off the medial flare of the tibia so as to maintain a continuous soft tissue sleeve. The patella was subluxed laterally and the patellofemoral ligament was incised. Inspection of the knee demonstrated severe degenerative changes, most notably to the lateral compartment. Several osteochondral loose bodies were removed. The anterior and posterior cruciate ligaments were excised. Two 4.0 mm Schanz pins were inserted into the femur and into the tibia for attachment of the ray of trackers used for computer-assisted navigation. Hip center was identified using circumduction technique. Distal landmarks were mapped using the computer. The distal femur and proximal tibia were mapped using the computer. Distal femoral cutting guide was positioned using computer-assisted navigation so as to achieve 5 degree distal valgus cut. Cut was performed and verified using the computer. Distal femur was sized and it was felt that a size 4 N (narrow) femoral component was appropriate. A size 4 cutting guide was positioned and the anterior cut was performed and verified using the computer. This was followed by completion of the posterior and chamfer cuts. Femoral cutting guide for the central box was then positioned and the central box cut was performed. Attention was then directed to the proximal tibia. Medial and lateral menisci were excised. The extramedullary tibial cutting guide was positioned using computer-assisted navigation so as to achieve 0 degrees varus valgus alignment and 0 degrees posterior slope. Cut was performed and verified using the computer. The proximal tibia was sized and it was felt that a size 4  tibial  tray was appropriate. Tibial and femoral components were placed followed by insertion of polyethylene trial. The knee was felt to be extremely tight laterally. Trial components were removed and the knee was placed in full extension and distracted using the Moreland retractors. The posterolateral corner was carefully excised using a combination of electrocautery and Metzenbaum scissors. Trial components were replaced followed by insertion of first a 12.5 and subsequently a 15 mm polyethylene insert. This allowed for excellent mediolateral soft tissue balance both in full extension and in flexion. Finally, the patella was cut and repaired so as to accommodate a 35 mm 3 peg oval dome patella. Patellar trial was placed and the knee was placed through range of motion with good patellar tracking appreciated. The femoral trial was removed. Central post hole for the tibial component was reamed followed by insertion of a keel punch. Tibial trial was then removed. The cut surfaces of bone were irrigated with copious amounts of normal saline with antibiotic solution using pulsatile lavage and then suctioned dry. Polymethylmethacrylate cement with gentamicin was prepared in the usual fashion using a vacuum mixer. Cement was applied to the cut surface of the proximal tibia as well as along the undersurface of a size 4 MBT tibial component. Tibial component was positioned and impacted into place. Excess cement was removed using freer elevators. Cement was then applied to the cut surface of the femur as well as along the posterior flanges of a size 4 N (narrow) posterior stabilized femoral component. Femoral component was positioned and impacted into place. Excess cement was removed using freer elevators. It should be noted that drill holes were placed in the lateral femoral condyle for an additional anchoring of the cement. A 15 mm stabilized rotating platform polyethylene trial was inserted and the knee was brought in  full extension with steady axial compression applied. Finally, cement was applied to the backside of a 35 mm 3 peg oval dome patella and the patellar component was positioned and patellar clamp applied. Excess cement was removed using freer elevators.   After adequate curing of cement, the tourniquet was deflated after a total tourniquet time of 112 minutes. Hemostasis was achieved using electrocautery. The knee was irrigated with copious amounts of normal saline with antibiotic solution using pulsatile lavage and then suctioned dry. The knee was inspected for any residual cement debris. Then, 20 mL of 1.3% Exparel and 40 mL of normal saline was injected along the posterior capsule, medial and lateral gutters, and along the arthrotomy site. A 15 mm stabilized rotating platform polyethylene insert was inserted and the knee was placed through a range of motion with excellent mediolateral soft tissue balance appreciated and excellent patellar tracking appreciated. Two medium drains were placed in the wound bed and brought out through a separate stab incision to be attached to a reinfusion system. The medial parapatellar portion of the incision was reapproximated using interrupted sutures of #1 Vicryl. Then, 30 mL of 0.25% Marcaine with epinephrine was injected along the subcutaneous tissue. The subcutaneous tissue was approximated in layers using first #0 Vicryl followed by #2-0 Vicryl. Skin was closed with skin staples. A sterile dressing was applied.   The patient tolerated the procedure well. She was transported to the recovery room in stable condition.    ____________________________ Laurice Record. Holley Bouche., MD jph:JT D: 07/07/2014 19:07:32 ET T: 07/07/2014 21:29:34 ET JOB#: [id number]  cc: Laurice Record. Holley Bouche., MD, <Dictator> JAMES P Holley Bouche MD ELECTRONICALLY SIGNED 07/11/2014 9:32

## 2015-01-22 NOTE — Op Note (Signed)
PATIENT NAME:  Stephanie [name], Stephanie [name] MR#:  [id number] DATE OF BIRTH:  Jun 19, 1941  DATE OF OPERATION:  12/08/2013  PRINCIPAL DIAGNOSIS: Bladder neoplasm, uncertain.   POSTOPERATIVE DIAGNOSIS: Bladder neoplasm, uncertain.  PROCEDURE: Cystoscopy, bladder biopsy.   SURGEON: Edrick Oh, MD  ANESTHESIA: General endotracheal anesthesia.   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 She has been demonstrated to have inflammatory changes within the bladder. She had been placed on oral antibiotic suppression therapy. She underwent a recent CT scan for further evaluation of GI issues. She was found to have significant thickening of the bladder, particularly at the right anterior dome and lateral bladder, as well as left bladder base. The findings are concerning for possible tumor. She presents for cystoscopy, bladder biopsy.   DESCRIPTION OF PROCEDURE: After informed consent was obtained, the patient was taken to the Operating Room and placed in the dorsal lithotomy position under general endotracheal anesthesia. The patient was then prepped and draped in the usual standard fashion. A 22 French rigid cystoscope was introduced into the urethra under direct vision, with no urethral abnormalities noted. Upon entering the bladder, the mucosa was inspected in its entirety. An area on the posterior bladder wall demonstrated segmental sections of keratinizing squamous metaplasia. The surrounding mucosa appeared to be relatively normal. On the more anterior portion of the posterior bladder wall as well as lateral aspects, significant inflammatory erythematous-appearing tissue was present. No gross tumor either solid or papillary could be identified. The irregular tissue extended onto the dome of the bladder. There was also an area on the right lateral wall which was fairly minimal. There were areas of keratinizing squamous metaplasia also within  this region. There was another area on the left bladder base just medial to the trigone with additional irregular erythematous mucosa with some keratinizing metaplasia. Cold cup biopsy forceps were utilized to obtain biopsies from each of these areas. The areas were then cauterized utilizing a Bugbee electrode. The ureteral orifices were well-visualized. They were an adequate distance from the biopsy sites. No other abnormalities were appreciated at this level. Once the areas were cauterized, they were reinspected, with no evidence of any significant bleeding. The bladder was drained. The cystoscope was removed. An 55 French red rubber catheter was inserted with 20 mL of 2% lidocaine instilled into the urinary bladder. The catheter was then removed. The patient was returned to the supine position and awakened from general endotracheal anesthesia. She was taken to the recovery room in stable condition. There were no problems or complications. The patient tolerated the procedure well. The pathology specimens were sent to Pathology for further evaluation. Estimated blood loss was minimal.    ____________________________ Denice Bors. Jacqlyn Larsen, MD bsc:mr D: 12/08/2013 19:23:49 ET T: 12/08/2013 19:52:23 ET JOB#: [id number]  cc: Denice Bors. Jacqlyn Larsen, MD, <Dictator> Denice Bors Lexy Meininger MD ELECTRONICALLY SIGNED 12/08/2013 21:00

## 2015-01-30 NOTE — Discharge Summary (Signed)
PATIENT NAME:  Stephanie [name], Stephanie [name] MR#:  [id number] DATE OF BIRTH:  1941-02-23  DATE OF ADMISSION:  01/12/2015 DATE OF DISCHARGE:  01/15/2015  ADMITTING DIAGNOSIS: Degenerative arthrosis of the left knee.   DISCHARGE DIAGNOSIS: Degenerative arthrosis of the left knee.   HISTORY: The patient is a 74 year old who has been followed at [facility] for progression of left knee pain. She had previously underwent a right total knee arthroplasty in 2015 and had done very well. However she has continued to have significant discomfort with the left knee. She does not notice any significant swelling or locking of the left knee, but was noted to have increased pain. She had also noticed some decrease in her activities secondary to increased pain. The patient states that her pain had increased to the point that it interfered with her activities of daily living. X-rays taken in [facility] showed tricompartmental degenerative changes. She was noted to have a significant amount of osteophyte formation specifically along the femoral condyles. There was subchondral sclerosis noted as well. After discussion of the risks and benefits of surgical intervention the patient expressed her understanding of the risks and benefits and agreed for plan for surgical intervention.   PROCEDURE: Left total knee arthroplasty using computer-assisted navigation.   ANESTHESIA: General.   SOFT TISSUE RELEASE: Anterior cruciate ligament, posterior cruciate ligament, deep medial collateral ligaments as well as the patellofemoral ligament.   IMPLANTS UTILIZED: DePuy PFC Sigma size 4 narrow posterior stabilized femoral component cemented, size 4 MBT tibial component cemented, 35 mm 3 pegged oval dome patella cemented, and a 10 mm stabilized rotating platform polyethylene insert. Gentamicin bone cement was utilized.   HOSPITAL COURSE: The patient tolerated the procedure very well. She had no complications. She was then taken  to PACU where she was stabilized and then transferred to the orthopedic floor. The patient began receiving anticoagulation therapy of Lovenox 30 mg subcutaneous q. 12 hours per anesthesia and pharmacy protocol. She was fitted with TED stockings bilaterally. These were allowed to be removed 1 hour per 8 hour shift. The left one was applied on day 2 following removal of the Hemovac. The patient was also fitted with AVI compression foot pumps bilaterally set at 80 mmHg. Her calves have been nontender. There has been no evidence of any DVTs. Negative Homan sign. Heels were elevated off the bed using rolled towels.   The patient has denied any chest pain or shortness of breath. Physical therapy was initiated on day 1 for gait training and transfers. She has progressed well. Upon being discharged she was ambulating greater than 200 feet. Was able go up and down 4 steps. She was independent with bed to chair transfers. Occupational therapy was also initiated on day 1 for ADL and assistive devices.   The patient's IV, Foley, and Hemovac were discontinued on day 2. Polar Care was reapplied to the surgical leg maintaining a temperature of 40-50 degrees Fahrenheit. The dressing wrap was removed on day 2. The wound was free of any drainage or any signs of infection.   DISPOSITION: The patient is being discharged to home in improved stable condition. She is to continue weight-bearing as tolerated. Continue using a rolling walker until cleared by physical therapy to go to a quad cane. She will receive home health PT.  She was instructed on elevation of the lower extremity. Continue with TED stockings bilaterally. These may be removed at night but are to be worn during the day. Recommend that she continue  with incentive spirometer q. 2 hour while awake. Encouraged to have deep breathing q. 2 hour while awake. She is placed on a diabetic diet. Continue Polar Care, maintaining a temperature of 40-50 degrees Fahrenheit. She has  a followup appointment at [facility] on April 28 at 10:15. Call sooner if any temperatures of 101.5 or greater or excessive bleeding.   DISCHARGE MEDICATIONS: The patient may resume her regular medication that she was on prior to admission. She was given a prescription for oxycodone 5-10 mg q. 4-6 hours p.r.n. for pain, tramadol 50-100 mg q. 4-6 hours p.r.n. for pain, and Lovenox 40 mg subcutaneous daily for 14 days and then discontinue and begin taking one 81 mg enteric-coated aspirin.   PAST MEDICAL HISTORY: Rheumatoid arthritis, lumbar disk disease, hyperlipidemia, hypertension, B12 deficiency, colon cancer, hyperglycemia, gastritis, small bowel obstruction, glaucoma.   ____________________________ Vance Peper, PA jrw:bu D: 01/14/2015 07:06:00 ET T: 01/14/2015 13:12:34 ET  JOB#: [id number] Kreg Earhart PA ELECTRONICALLY SIGNED 01/16/2015 20:23

## 2015-01-30 NOTE — Op Note (Signed)
PATIENT NAME:  Stephanie [name], Stephanie [name] MR#:  [id number] DATE OF BIRTH:  [birth year]  DATE OF PROCEDURE:  01/12/2015  PREOPERATIVE DIAGNOSIS: Degenerative arthrosis of the left knee superimposed on rheumatoid arthritis.   POSTOPERATIVE DIAGNOSIS: Degenerative arthrosis of the left knee superimposed on rheumatoid arthritis.   PROCEDURE PERFORMED: Left total knee arthroplasty using computer-assisted navigation.   SURGEON: Laurice Record. Hooten, M.D.   ASSISTANT: Vance Peper, PA (required to maintain retraction throughout the procedure).   ANESTHESIA: General.   ESTIMATED BLOOD LOSS: 50 mL.   Fluids replaced: 1200 mL of crystalloid.   TOURNIQUET TIME: 77 minutes.   DRAINS: Two medium drains to reinfusion system.   SOFT TISSUE RELEASES: Anterior cruciate ligament, posterior cruciate ligament, deep medial collateral ligament and patellofemoral ligament.   IMPLANTS UTILIZED: DePuy PFC Sigma size 4N (narrow) posterior stabilized femoral component (cemented), size 4 MBT tibial component (cemented), 35 mm 3 peg oval dome patella (cemented) and a 10 mm stabilized rotating platform polyethylene insert. Gentamicin bone cement was utilized.   INDICATIONS FOR SURGERY: The patient is a 74 year old female with a history of rheumatoid arthritis, who has had progressive left knee pain. X-rays demonstrated significant degenerative changes with osteophyte formation and subchondral sclerosis. After discussion of the risks and benefits of surgical intervention, the patient expressed an understanding of the risks and benefits, and agreed with plans for surgical intervention.   PROCEDURE IN DETAIL: The patient was brought into the Operating Room and, after adequate general anesthesia was achieved, a tourniquet was placed on the patient's upper left thigh. The patient's left knee and leg were cleaned and prepped with alcohol and DuraPrep, draped in the usual sterile fashion. A "timeout" was as per usual protocol. The left lower  extremity was exsanguinated using an Esmarch, the tourniquet was inflated to 300 mmHg. An anterior longitudinal incision was made followed by a standard mid vastus approach. A large effusion was evacuated. The deep fibers of the medial collateral ligament were elevated in a subperiosteal fashion off the medial flare of the tibia so as to maintain a continuous soft tissue sleeve. The patella was subluxed laterally and the patellofemoral ligament was incised. Inspection of the knee demonstrated severe degenerative changes with full-thickness loss of articular cartilage to the lateral compartment. Prominent osteophytes were debrided using a rongeur. Anterior and posterior cruciate ligaments were excised. Two 4.0 mm Schanz pins were inserted into the femur and into the tibia for attachment of the ray of trackers used for computer-assisted navigation. Hip center was identified using circumduction technique. Distal landmarks were mapped using the computer. The distal femur and proximal tibia were mapped using a computer. Distal femoral cutting guide was positioned using computer-assisted navigation so as to achieve 5 degrees distal valgus cut. Cut was performed and verified using the computer. Distal femur was sized and it was felt that a size 4N (narrow) implant was appropriate. A size 4 cutting guide was positioned and the anterior cut was performed and verified using the computer. This was followed by completion of the posterior and chamfer cuts. Femoral cutting guide for the central box was then positioned and the central box cut was performed. Attention was then directed to the proximal tibia. Medial and lateral menisci were excised. The extramedullary tibial cutting guide was positioned using computer-assisted navigation so as to achieve a 0 degree varus valgus alignment and 0 degrees posterior slope. Cut was performed and verified using the computer. The proximal tibia was sized and it was felt that a size 4 tibial  tray was appropriate. Tibial and femoral trials were inserted followed by insertion of a 10 mm polyethylene insert. Excellent mediolateral soft tissue balancing was appreciated both in full extension and in flexion. Finally, the patella was cut and repaired so as to accommodate a 35 mm 3 peg oval dome patella. Patellar trial was placed and the knee was placed through a range of motion with excellent patellar tracking appreciated. The femoral trial was removed after debridement of the posterior osteophytes. Central post hole for the tibial component was reamed followed by insertion of a keel punch. Tibial trial was then removed. Cut surfaces of bone were irrigated with copious amounts of normal saline with antibiotic solution using pulsatile lavage and then suctioned dry. Polymethyl methacrylate cement with gentamicin was prepared in the usual fashion using a vacuum mixer. Cement was applied to the cut surface of the proximal tibia, as well as along the undersurface of a size 4 MBT the tibial component. The tibial component was positioned and impacted into place. Excess cement was removed using freer elevators. Cement was then applied to the cut surface of the distal femur, as well as on the posterior flanges of a size 4N (narrow) posterior stabilized femoral component. Femoral component was positioned and impacted into place. Excess cement was removed using [employer] fast streamer. A 10 mm polyethylene trial was inserted and the knee was brought in full extension with steady axial compression applied. Finally, cement was applied to the backside of a 36 mm 3 peg oval dome patella. Patellar trial was placed and it was placed through range of motion with excellent patellar tracking appreciated.   After adequate curing of cement, the tourniquet was deflated after total tourniquet time of 77 minutes. Hemostasis was achieved using electrocautery. The knee was irrigated with copious amounts of normal saline with antibiotic  solution using pulsatile lavage and then suctioned dry.  The knee was inspected for any residual cement debris. 20 mL of 1.3% Exparel and 40 mL of normal saline was injected along the posterior capsule, the medial and lateral gutters, and along the arthrotomy site. A 10 mm stabilized rotating platform polyethylene insert was inserted and the knee was placed through a range of motion with excellent mediolateral soft tissue balancing noted both in full extension and in flexion. Excellent patellar tracking was appreciated. Two medium drains were placed in the wound bed and brought out through a separate stab incision to be attached to a reinfusion system. The medial parapatellar portion of the incision was reapproximated using interrupted sutures of #1 Vicryl. The subcutaneous tissue was approximated in layers using first #0 Vicryl followed by 2-0 Vicryl.  30 mL of 0.25% Marcaine with epinephrine was injected along the skin margins. The skin was closed with skin staples. A sterile dressing was applied.   The patient tolerated the procedure well. She was transported to the recovery room in stable condition.    ____________________________ Laurice Record. Holley Bouche., MD jph:AT D: 01/12/2015 14:52:44 ET T: 01/12/2015 17:25:14 ET JOB#: [id number]  cc: Jeneen Rinks P. Holley Bouche., MD, <Dictator> JAMES P Holley Bouche MD ELECTRONICALLY SIGNED 01/27/2015 7:10

## 2015-10-19 ENCOUNTER — Other Ambulatory Visit: Payer: Self-pay | Admitting: Internal Medicine

## 2015-10-19 DIAGNOSIS — Z1231 Encounter for screening mammogram for malignant neoplasm of breast: Secondary | ICD-10-CM

## 2015-11-01 DIAGNOSIS — R310 Gross hematuria: Secondary | ICD-10-CM | POA: Insufficient documentation

## 2015-12-14 ENCOUNTER — Ambulatory Visit
Admission: RE | Admit: 2015-12-14 | Discharge: 2015-12-14 | Disposition: A | Discharge status: Home / Self Care | Payer: Medicare Other | Source: Ambulatory Visit | Attending: Internal Medicine | Admitting: Internal Medicine

## 2015-12-14 ENCOUNTER — Other Ambulatory Visit: Payer: Self-pay | Admitting: Internal Medicine

## 2015-12-14 DIAGNOSIS — Z1231 Encounter for screening mammogram for malignant neoplasm of breast: Secondary | ICD-10-CM | POA: Diagnosis not present

## 2015-12-14 HISTORY — DX: Malignant (primary) neoplasm, unspecified: C80.1

## 2016-04-18 DIAGNOSIS — E782 Mixed hyperlipidemia: Secondary | ICD-10-CM | POA: Insufficient documentation

## 2016-08-13 DIAGNOSIS — M0579 Rheumatoid arthritis with rheumatoid factor of multiple sites without organ or systems involvement: Secondary | ICD-10-CM | POA: Insufficient documentation

## 2016-10-25 ENCOUNTER — Other Ambulatory Visit: Payer: Self-pay | Admitting: Internal Medicine

## 2016-10-25 DIAGNOSIS — Z1231 Encounter for screening mammogram for malignant neoplasm of breast: Secondary | ICD-10-CM

## 2016-12-17 ENCOUNTER — Ambulatory Visit
Admission: RE | Admit: 2016-12-17 | Discharge: 2016-12-17 | Disposition: A | Discharge status: Home / Self Care | Payer: Medicare Other | Source: Ambulatory Visit | Attending: Internal Medicine | Admitting: Internal Medicine

## 2016-12-17 DIAGNOSIS — Z1231 Encounter for screening mammogram for malignant neoplasm of breast: Secondary | ICD-10-CM | POA: Diagnosis not present

## 2017-05-21 ENCOUNTER — Encounter: Payer: Self-pay | Admitting: *Deleted

## 2017-05-22 ENCOUNTER — Encounter
Admission: RE | Disposition: A | Discharge status: Home / Self Care | Payer: Self-pay | Source: Ambulatory Visit | Attending: Unknown Physician Specialty

## 2017-05-22 ENCOUNTER — Encounter: Payer: Self-pay | Admitting: Anesthesiology

## 2017-05-22 ENCOUNTER — Ambulatory Visit
Admission: RE | Admit: 2017-05-22 | Discharge: 2017-05-22 | Disposition: A | Discharge status: Home / Self Care | Payer: Medicare Other | Source: Ambulatory Visit | Attending: Unknown Physician Specialty | Admitting: Unknown Physician Specialty

## 2017-05-22 ENCOUNTER — Ambulatory Visit: Payer: Medicare Other | Admitting: Anesthesiology

## 2017-05-22 DIAGNOSIS — I10 Essential (primary) hypertension: Secondary | ICD-10-CM | POA: Insufficient documentation

## 2017-05-22 DIAGNOSIS — Z1211 Encounter for screening for malignant neoplasm of colon: Secondary | ICD-10-CM | POA: Insufficient documentation

## 2017-05-22 DIAGNOSIS — Z90710 Acquired absence of both cervix and uterus: Secondary | ICD-10-CM | POA: Insufficient documentation

## 2017-05-22 DIAGNOSIS — K640 First degree hemorrhoids: Secondary | ICD-10-CM | POA: Insufficient documentation

## 2017-05-22 DIAGNOSIS — Z79899 Other long term (current) drug therapy: Secondary | ICD-10-CM | POA: Insufficient documentation

## 2017-05-22 DIAGNOSIS — Z980 Intestinal bypass and anastomosis status: Secondary | ICD-10-CM | POA: Insufficient documentation

## 2017-05-22 DIAGNOSIS — H409 Unspecified glaucoma: Secondary | ICD-10-CM | POA: Diagnosis not present

## 2017-05-22 DIAGNOSIS — E119 Type 2 diabetes mellitus without complications: Secondary | ICD-10-CM | POA: Insufficient documentation

## 2017-05-22 DIAGNOSIS — M1990 Unspecified osteoarthritis, unspecified site: Secondary | ICD-10-CM | POA: Insufficient documentation

## 2017-05-22 DIAGNOSIS — Z85038 Personal history of other malignant neoplasm of large intestine: Secondary | ICD-10-CM | POA: Insufficient documentation

## 2017-05-22 DIAGNOSIS — E538 Deficiency of other specified B group vitamins: Secondary | ICD-10-CM | POA: Insufficient documentation

## 2017-05-22 DIAGNOSIS — E785 Hyperlipidemia, unspecified: Secondary | ICD-10-CM | POA: Diagnosis not present

## 2017-05-22 DIAGNOSIS — Z7984 Long term (current) use of oral hypoglycemic drugs: Secondary | ICD-10-CM | POA: Diagnosis not present

## 2017-05-22 HISTORY — DX: Essential (primary) hypertension: I10

## 2017-05-22 HISTORY — DX: Unspecified thoracic, thoracolumbar and lumbosacral intervertebral disc disorder: M51.9

## 2017-05-22 HISTORY — PX: COLONOSCOPY WITH PROPOFOL: SHX5780

## 2017-05-22 HISTORY — DX: Unspecified intestinal obstruction, unspecified as to partial versus complete obstruction: K56.609

## 2017-05-22 HISTORY — DX: Deficiency of other specified B group vitamins: E53.8

## 2017-05-22 HISTORY — DX: Unspecified glaucoma: H40.9

## 2017-05-22 HISTORY — DX: Unspecified osteoarthritis, unspecified site: M19.90

## 2017-05-22 HISTORY — DX: Hyperlipidemia, unspecified: E78.5

## 2017-05-22 HISTORY — DX: Type 2 diabetes mellitus without complications: E11.9

## 2017-05-22 LAB — GLUCOSE, CAPILLARY: GLUCOSE-CAPILLARY: 188 mg/dL — AB (ref 65–99)

## 2017-05-22 SURGERY — COLONOSCOPY WITH PROPOFOL
Anesthesia: General

## 2017-05-22 MED ORDER — SODIUM CHLORIDE 0.9 % IV SOLN
INTRAVENOUS | Status: DC
  Administered 2017-05-22: 1000 mL via INTRAVENOUS

## 2017-05-22 MED ORDER — SODIUM CHLORIDE 0.9 % IV SOLN: INTRAVENOUS | Status: DC

## 2017-05-22 MED ORDER — MIDAZOLAM HCL 2 MG/2ML IJ SOLN
INTRAMUSCULAR | Status: DC | PRN
Start: 2017-05-22 — End: 2017-05-22
  Administered 2017-05-22: 2 mg via INTRAVENOUS

## 2017-05-22 MED ORDER — PIPERACILLIN-TAZOBACTAM 3.375 G IVPB 30 MIN
3.3750 g | Freq: Once | INTRAVENOUS | Status: AC
Start: 2017-05-22 — End: 2017-05-22
  Administered 2017-05-22: 3.375 g via INTRAVENOUS
  Filled 2017-05-22: qty 50

## 2017-05-22 MED ORDER — PROPOFOL 500 MG/50ML IV EMUL
INTRAVENOUS | Status: DC | PRN
  Administered 2017-05-22: 120 ug/kg/min via INTRAVENOUS

## 2017-05-22 MED ORDER — PIPERACILLIN-TAZOBACTAM 3.375 G IVPB
INTRAVENOUS | Status: AC
  Filled 2017-05-22: qty 50

## 2017-05-22 MED ORDER — PROPOFOL 500 MG/50ML IV EMUL
INTRAVENOUS | Status: AC
  Filled 2017-05-22: qty 50

## 2017-05-22 MED ORDER — FENTANYL CITRATE (PF) 100 MCG/2ML IJ SOLN
INTRAMUSCULAR | Status: AC
  Filled 2017-05-22: qty 2

## 2017-05-22 MED ORDER — FENTANYL CITRATE (PF) 100 MCG/2ML IJ SOLN
INTRAMUSCULAR | Status: DC | PRN
  Administered 2017-05-22: 50 ug via INTRAVENOUS

## 2017-05-22 MED ORDER — ONDANSETRON HCL 4 MG/2ML IJ SOLN
INTRAMUSCULAR | Status: AC
  Filled 2017-05-22: qty 2

## 2017-05-22 MED ORDER — ONDANSETRON HCL 4 MG/2ML IJ SOLN
INTRAMUSCULAR | Status: DC | PRN
  Administered 2017-05-22: 4 mg via INTRAVENOUS

## 2017-05-22 MED ORDER — MIDAZOLAM HCL 2 MG/2ML IJ SOLN
INTRAMUSCULAR | Status: AC
  Filled 2017-05-22: qty 2

## 2017-05-22 NOTE — Anesthesia Preprocedure Evaluation (Signed)
Anesthesia Evaluation  Patient identified by MRN, date of birth, ID band Patient awake    Reviewed: Allergy & Precautions, NPO status , Patient's Chart, lab work & pertinent test results  History of Anesthesia Complications Negative for: history of anesthetic complications  Airway Mallampati: II  TM Distance: >3 FB Neck ROM: Full    Dental no notable dental hx.    Pulmonary neg pulmonary ROS, neg sleep apnea, neg COPD,    breath sounds clear to auscultation- rhonchi (-) wheezing      Cardiovascular Exercise Tolerance: Good hypertension, Pt. on medications (-) CAD, (-) Past MI and (-) Cardiac Stents  Rhythm:Regular Rate:Normal - Systolic murmurs and - Diastolic murmurs    Neuro/Psych negative neurological ROS  negative psych ROS   GI/Hepatic negative GI ROS, Neg liver ROS,   Endo/Other  diabetes, Oral Hypoglycemic Agents  Renal/GU negative Renal ROS     Musculoskeletal  (+) Arthritis ,   Abdominal (+) - obese,   Peds  Hematology negative hematology ROS (+)   Anesthesia Other Findings Past Medical History: No date: Arthritis 1996: Cancer ([place])     Comment:  colon No date: Diabetes mellitus without complication (HCC) No date: Glaucoma No date: Hyperlipidemia No date: Hypertension No date: Lumbar disc disease No date: Small bowel obstruction (HCC) No date: Vitamin B 12 deficiency   Reproductive/Obstetrics                             Anesthesia Physical Anesthesia Plan  ASA: II  Anesthesia Plan: General   Post-op Pain Management:    Induction: Intravenous  PONV Risk Score and Plan: 2 and Propofol infusion  Airway Management Planned: Natural Airway  Additional Equipment:   Intra-op Plan:   Post-operative Plan:   Informed Consent: I have reviewed the patients History and Physical, chart, labs and discussed the procedure including the risks, benefits and alternatives for  the proposed anesthesia with the patient or authorized representative who has indicated his/her understanding and acceptance.   Dental advisory given  Plan Discussed with: CRNA and Anesthesiologist  Anesthesia Plan Comments:         Anesthesia Quick Evaluation

## 2017-05-22 NOTE — Anesthesia Post-op Follow-up Note (Signed)
Anesthesia QCDR form completed.        

## 2017-05-22 NOTE — H&P (Signed)
Primary Care Physician:  Rusty Aus, MD Primary Gastroenterologist:  Dr. Vira Agar  Pre-Procedure History & Physical: HPI:  Stephanie [name] is a 76 y.o. female is here for an colonoscopy.   Past Medical History:  Diagnosis Date  . Arthritis   . Cancer ([place]) 1996   colon  . Diabetes mellitus without complication ([place])   . Glaucoma   . Hyperlipidemia   . Hypertension   . Lumbar disc disease   . Small bowel obstruction ([place])   . Vitamin B 12 deficiency     Past Surgical History:  Procedure Laterality Date  . ABDOMINAL HYSTERECTOMY    . BACK SURGERY    . COLON SURGERY    . FRACTURE SURGERY    . KNEE ARTHROSCOPY    . LUMBAR LAMINECTOMY      Prior to Admission medications   Medication Sig Start Date End Date Taking? Authorizing Provider  ABATACEPT [place] Inject 1 Dose into the skin every 30 (thirty) days.   Yes [provider]  amLODipine (NORVASC) 10 MG tablet Take 10 mg by mouth daily.   Yes [provider]  Ascorbic Acid (VITAMIN C) 500 MG CAPS Take 1 capsule by mouth daily.   Yes [provider]  brimonidine (ALPHAGAN) 0.2 % ophthalmic solution Place 1 drop into both eyes 3 (three) times daily.   Yes [provider]  calcium carbonate (OS-CAL - DOSED IN MG OF ELEMENTAL CALCIUM) 1250 (500 Ca) MG tablet Take 1 tablet by mouth daily.   Yes [provider]  estradiol (ESTRACE) 0.1 MG/GM vaginal cream Place 1 Applicatorful vaginally at bedtime.   Yes [provider]  Fluticasone-Salmeterol (ADVAIR DISKUS) 100-50 MCG/DOSE AEPB Inhale 1 puff into the lungs 2 (two) times daily as needed.   Yes [provider]  folic acid (FOLVITE) 1 MG tablet Take 1 mg by mouth daily.   Yes [provider]  glimepiride (AMARYL) 1 MG tablet Take 1 mg by mouth 2 days. Take 0.5 tablet every other day   Yes [provider]  hydrochlorothiazide (HYDRODIURIL) 25 MG tablet Take 25 mg by mouth daily.   Yes [provider]   leflunomide (ARAVA) 10 MG tablet Take 10 mg by mouth daily.   Yes [provider]  lisinopril (PRINIVIL,ZESTRIL) 20 MG tablet Take 20 mg by mouth daily.   Yes [provider]  magnesium oxide (MAG-OX) 400 MG tablet Take 400 mg by mouth daily.   Yes [provider]  metFORMIN (GLUCOPHAGE-XR) 500 MG 24 hr tablet Take 500 mg by mouth 2 (two) times daily.   Yes [provider]  metoprolol tartrate (LOPRESSOR) 50 MG tablet Take 50 mg by mouth 2 (two) times daily.   Yes [provider]  Multiple Vitamins-Minerals (CENTRUM SILVER PO) Take 1 tablet by mouth daily.   Yes [provider]  omeprazole (PRILOSEC) 40 MG capsule Take 40 mg by mouth daily.   Yes [provider]  Ondansetron (ZOFRAN ODT PO) Take 1 tablet by mouth every 8 (eight) hours as needed.   Yes [provider]  potassium chloride SA (K-DUR,KLOR-CON) 20 MEQ tablet Take 20 mEq by mouth daily.   Yes [provider]  simvastatin (ZOCOR) 20 MG tablet Take 20 mg by mouth daily.   Yes [provider]  tacrolimus (PROTOPIC) 0.1 % ointment Apply topically 2 (two) times daily as needed.   Yes [provider]  tamsulosin (FLOMAX) 0.4 MG CAPS capsule Take 0.4 mg by  mouth daily.   Yes [provider]  vitamin B-12 (CYANOCOBALAMIN) 1000 MCG tablet Take 1,000 mcg by mouth daily.   Yes [provider]  sulfaSALAzine (AZULFIDINE) 500 MG EC tablet Take 1,000 mg by mouth 2 (two) times daily.    [provider]    Allergies as of 02/19/2017  . (Not on File)    Family History  Problem Relation Age of Onset  . Breast cancer Neg Hx     Social History   Social History  . Marital status: Married    Spouse name: N/A  . Number of children: N/A  . Years of education: N/A   Occupational History  . Not on file.   Social History Main Topics  . Smoking status: Never Smoker  . Smokeless tobacco: Never Used  . Alcohol use No  .  Drug use: No  . Sexual activity: Yes   Other Topics Concern  . Not on file   Social History Narrative  . No narrative on file    Review of Systems: See HPI, otherwise negative ROS  Physical Exam: BP 139/64   Pulse 94   Temp (!) 97.5 F (36.4 C) (Tympanic)   Resp 16   Ht 5\' 6"  (1.676 m)   Wt 74.8 kg (165 lb)   SpO2 97%   BMI 26.63 kg/m  General:   Alert,  pleasant and cooperative in NAD Head:  Normocephalic and atraumatic. Neck:  Supple; no masses or thyromegaly. Lungs:  Clear throughout to auscultation.    Heart:  Regular rate and rhythm. Abdomen:  Soft, nontender and nondistended. Normal bowel sounds, without guarding, and without rebound.   Neurologic:  Alert and  oriented x4;  grossly normal neurologically.  Impression/Plan: Stephanie [name] is here for an colonoscopy to be performed for Personal history of colon cancer.  Risks, benefits, limitations, and alternatives regarding  colonoscopy have been reviewed with the patient.  Questions have been answered.  All parties agreeable.   Gaylyn Cheers, MD  05/22/2017, 8:27 AM

## 2017-05-22 NOTE — Anesthesia Procedure Notes (Signed)
Performed by: COOK-MARTIN, Janeliz [place] Pre-anesthesia Checklist: Patient identified, Emergency Drugs available, Suction available, Patient being monitored and Timeout performed Patient Re-evaluated:Patient Re-evaluated prior to induction Oxygen Delivery Method: Nasal cannula Preoxygenation: Pre-oxygenation with 100% oxygen Induction Type: IV induction Placement Confirmation: positive ETCO2 and CO2 detector       

## 2017-05-22 NOTE — Anesthesia Postprocedure Evaluation (Signed)
Anesthesia Post Note  Patient: Stephanie [name]  Procedure(s) Performed: Procedure(s) (LRB): COLONOSCOPY WITH PROPOFOL (N/A)  Patient location during evaluation: Endoscopy Anesthesia Type: General Level of consciousness: awake and alert and oriented Pain management: pain level controlled Vital Signs Assessment: post-procedure vital signs reviewed and stable Respiratory status: spontaneous breathing, nonlabored ventilation and respiratory function stable Cardiovascular status: blood pressure returned to baseline and stable Postop Assessment: no signs of nausea or vomiting Anesthetic complications: no     Last Vitals:  Vitals:   05/22/17 0930 05/22/17 0940  BP: 119/63 128/66  Pulse: (!) 55 (!) 57  Resp: 15 16  Temp:    SpO2: 100% 97%    Last Pain:  Vitals:   05/22/17 0850  TempSrc: Tympanic                 Katoya Amato

## 2017-05-22 NOTE — Op Note (Signed)
[facility] Patient Name: Stephanie [name] Procedure Date: 05/22/2017 8:19 AM MRN: [id number] Account #: [id number] Date of Birth: 20-Jun-1941 Admit Type: Outpatient Age: 76 Room: [facility] ENDO ROOM 3 Gender: Female Note Status: Finalized Procedure:            Colonoscopy Indications:          High risk colon cancer surveillance: Personal history                        of colon cancer Providers:            Manya Silvas, MD Referring MD:         Rusty Aus, MD (Referring MD) Medicines:            Propofol per Anesthesia Complications:        No immediate complications. Procedure:            Pre-Anesthesia Assessment:                       - After reviewing the risks and benefits, the patient                        was deemed in satisfactory condition to undergo the                        procedure.                       After obtaining informed consent, the colonoscope was                        passed under direct vision. Throughout the procedure,                        the patient's blood pressure, pulse, and oxygen                        saturations were monitored continuously. The                        Colonoscope was introduced through the anus and                        advanced to the the cecum, identified by appendiceal                        orifice and ileocecal valve. The colonoscopy was                        performed without difficulty. The patient tolerated the                        procedure well. The quality of the bowel preparation                        was excellent. Findings:      Internal hemorrhoids were found during endoscopy. The hemorrhoids were       small and Grade I (internal hemorrhoids that do not prolapse). The       anastamosis of the rectum and colon looked very good.      The exam  was otherwise without abnormality. Impression:           - Internal hemorrhoids.                       - The examination was  otherwise normal.                       - No specimens collected. Recommendation:       - The findings and recommendations were discussed with                        the patient's family.                       - Perform a colonoscopy in 5 years. Manya Silvas, MD 05/22/2017 8:49:08 AM This report has been signed electronically. Number of Addenda: 0 Note Initiated On: 05/22/2017 8:19 AM Scope Withdrawal Time: 0 hours 6 minutes 38 seconds  Total Procedure Duration: 0 hours 11 minutes 32 seconds       [facility]

## 2017-05-22 NOTE — Transfer of Care (Signed)
Immediate Anesthesia Transfer of Care Note  Patient: Stephanie [name]  Procedure(s) Performed: Procedure(s): COLONOSCOPY WITH PROPOFOL (N/A)  Patient Location: PACU  Anesthesia Type:General  Level of Consciousness: awake and sedated  Airway & Oxygen Therapy: Patient Spontanous Breathing and Patient connected to nasal cannula oxygen  Post-op Assessment: Report given to RN and Post -op Vital signs reviewed and stable  Post vital signs: Reviewed and stable  Last Vitals:  Vitals:   05/22/17 0748  BP: 139/64  Pulse: 94  Resp: 16  Temp: (!) 36.4 C  SpO2: 97%    Last Pain:  Vitals:   05/22/17 0748  TempSrc: Tympanic         Complications: No apparent anesthesia complications

## 2017-05-23 ENCOUNTER — Encounter: Payer: Self-pay | Admitting: Unknown Physician Specialty

## 2017-10-16 DIAGNOSIS — R7989 Other specified abnormal findings of blood chemistry: Secondary | ICD-10-CM | POA: Insufficient documentation

## 2017-11-11 ENCOUNTER — Other Ambulatory Visit: Payer: Self-pay | Admitting: Internal Medicine

## 2017-11-11 DIAGNOSIS — Z1231 Encounter for screening mammogram for malignant neoplasm of breast: Secondary | ICD-10-CM

## 2017-12-18 ENCOUNTER — Ambulatory Visit
Admission: RE | Admit: 2017-12-18 | Discharge: 2017-12-18 | Disposition: A | Discharge status: Home / Self Care | Payer: Medicare Other | Source: Ambulatory Visit | Attending: Internal Medicine | Admitting: Internal Medicine

## 2017-12-18 DIAGNOSIS — Z1231 Encounter for screening mammogram for malignant neoplasm of breast: Secondary | ICD-10-CM

## 2018-03-19 ENCOUNTER — Other Ambulatory Visit: Payer: Self-pay | Admitting: Student

## 2018-03-19 DIAGNOSIS — M25551 Pain in right hip: Secondary | ICD-10-CM

## 2018-03-19 DIAGNOSIS — R519 Headache, unspecified: Secondary | ICD-10-CM

## 2018-03-19 DIAGNOSIS — R51 Headache: Principal | ICD-10-CM

## 2018-03-19 DIAGNOSIS — Z981 Arthrodesis status: Secondary | ICD-10-CM

## 2018-03-28 ENCOUNTER — Ambulatory Visit
Admission: RE | Admit: 2018-03-28 | Discharge: 2018-03-28 | Disposition: A | Discharge status: Home / Self Care | Payer: Medicare Other | Source: Ambulatory Visit | Attending: Student | Admitting: Student

## 2018-03-28 DIAGNOSIS — M48061 Spinal stenosis, lumbar region without neurogenic claudication: Secondary | ICD-10-CM | POA: Diagnosis not present

## 2018-03-28 DIAGNOSIS — M25551 Pain in right hip: Secondary | ICD-10-CM | POA: Diagnosis not present

## 2018-03-28 DIAGNOSIS — R51 Headache: Secondary | ICD-10-CM

## 2018-03-28 DIAGNOSIS — G44319 Acute post-traumatic headache, not intractable: Secondary | ICD-10-CM | POA: Diagnosis not present

## 2018-03-28 DIAGNOSIS — R519 Headache, unspecified: Secondary | ICD-10-CM

## 2018-03-28 DIAGNOSIS — Z981 Arthrodesis status: Secondary | ICD-10-CM | POA: Diagnosis not present

## 2018-04-14 ENCOUNTER — Other Ambulatory Visit: Payer: Self-pay | Admitting: Student

## 2018-04-14 DIAGNOSIS — M79604 Pain in right leg: Secondary | ICD-10-CM

## 2018-04-14 DIAGNOSIS — R531 Weakness: Secondary | ICD-10-CM

## 2018-04-14 DIAGNOSIS — Z981 Arthrodesis status: Secondary | ICD-10-CM

## 2018-04-29 ENCOUNTER — Ambulatory Visit
Admission: RE | Admit: 2018-04-29 | Discharge: 2018-04-29 | Disposition: A | Discharge status: Home / Self Care | Payer: Medicare Other | Source: Ambulatory Visit | Attending: Student | Admitting: Student

## 2018-04-29 DIAGNOSIS — R531 Weakness: Secondary | ICD-10-CM

## 2018-04-29 DIAGNOSIS — M79604 Pain in right leg: Secondary | ICD-10-CM | POA: Insufficient documentation

## 2018-04-29 DIAGNOSIS — M5126 Other intervertebral disc displacement, lumbar region: Secondary | ICD-10-CM | POA: Insufficient documentation

## 2018-04-29 DIAGNOSIS — Z981 Arthrodesis status: Secondary | ICD-10-CM

## 2018-04-29 DIAGNOSIS — M5124 Other intervertebral disc displacement, thoracic region: Secondary | ICD-10-CM | POA: Insufficient documentation

## 2018-04-29 DIAGNOSIS — M47816 Spondylosis without myelopathy or radiculopathy, lumbar region: Secondary | ICD-10-CM | POA: Diagnosis not present

## 2018-04-30 DIAGNOSIS — E782 Mixed hyperlipidemia: Secondary | ICD-10-CM | POA: Insufficient documentation

## 2018-04-30 DIAGNOSIS — E1169 Type 2 diabetes mellitus with other specified complication: Secondary | ICD-10-CM | POA: Insufficient documentation

## 2018-05-19 NOTE — Progress Notes (Signed)
05/20/2018 11:19 PM   Stephanie [name] July 11, 1941 [id number]  Referring provider: Rusty Aus, MD [facility] [place], [place] [address]  Chief Complaint  Patient presents with  . Urinary Retention    HPI: Patient is a 76 -year-old Caucasian female who is referred to [place] by Dr. [name] for incomplete emptying with, [name].   She is making 2 to 3 trips to the rest room during the day.  She has nocturia x 1-2.  Patient denies any gross hematuria, dysuria or suprapubic/flank pain.  Patient denies any fevers, chills, nausea or vomiting.   She does not have a history of urinary tract infections and is on suppressive doxycycline daily.    She is post menopausal.   She is currently on vaginal estrogen cream.  She admits denies constipation and/or diarrhea.   She is drinking good amount of water daily.   She is not drinking alcoholic beverages.      She was being followed by Dr. 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  She was last scoped in 10/2017 noted complete resolution of the keratinizing squamous metaplasia.   Her UA today is positive for 11-30 WBC's and moderate bacteria.  Her PVR is 345 mL.    Recent creatinine 0.7 in 03/2018  PMH: Past Medical History:  Diagnosis Date  . Arthritis   . Asthma   . Cancer ([place]) 1996   colon  . Diabetes mellitus without complication ([place])   . GERD (gastroesophageal reflux disease)   . Glaucoma   . Hyperlipidemia   . Hypertension   . Lumbar disc disease   . Small bowel obstruction ([place])   . Vitamin B 12 deficiency     Surgical History: Past Surgical History:  Procedure Laterality Date  . ABDOMINAL HYSTERECTOMY    . BACK SURGERY    . COLON SURGERY    . COLONOSCOPY WITH PROPOFOL N/A 05/22/2017   Procedure: COLONOSCOPY WITH PROPOFOL;  Surgeon: Manya Silvas, MD;  Location: [facility] ENDOSCOPY;  Service: Endoscopy;  Laterality: N/A;  . FRACTURE SURGERY    . KNEE  ARTHROSCOPY    . LUMBAR LAMINECTOMY      Home Medications:  Allergies as of 05/20/2018      Reactions   Ibuprofen Itching   Indomethacin    Infliximab    Methotrexate Derivatives    Moexipril    Percocet [oxycodone-acetaminophen] Itching   Naprosyn [naproxen] Rash      Medication List        Accurate as of 05/20/18 11:59 PM. Always use your most recent med list.          ADVAIR DISKUS 100-50 MCG/DOSE Aepb Generic drug:  Fluticasone-Salmeterol Inhale 1 puff into the lungs 2 (two) times daily as needed.   amLODipine 10 MG tablet Commonly known as:  NORVASC Take 10 mg by mouth daily.   brimonidine 0.2 % ophthalmic solution Commonly known as:  ALPHAGAN Place 1 drop into both eyes 3 (three) times daily.   calcium carbonate 1250 (500 Ca) MG tablet Commonly known as:  OS-CAL - dosed in mg of elemental calcium Take 1 tablet by mouth daily.   CENTRUM SILVER PO Take 1 tablet by mouth daily.   CONTOUR TEST test strip Generic drug:  glucose blood TEST BS QD UTD   estradiol 0.1 MG/GM vaginal cream Commonly known as:  ESTRACE Place 1 Applicatorful vaginally at bedtime.   folic acid 1 MG tablet Commonly known  as:  FOLVITE Take 1 mg by mouth daily.   glimepiride 1 MG tablet Commonly known as:  AMARYL Take 1 mg by mouth 2 days. Take 0.5 tablet every other day   hydrochlorothiazide 25 MG tablet Commonly known as:  HYDRODIURIL Take 25 mg by mouth daily.   leflunomide 10 MG tablet Commonly known as:  ARAVA Take 10 mg by mouth daily.   magnesium oxide 400 MG tablet Commonly known as:  MAG-OX Take 400 mg by mouth daily.   metFORMIN 500 MG 24 hr tablet Commonly known as:  GLUCOPHAGE-XR Take 500 mg by mouth 2 (two) times daily.   metoprolol succinate 50 MG 24 hr tablet Commonly known as:  TOPROL-XL   metoprolol tartrate 50 MG tablet Commonly known as:  LOPRESSOR Take 50 mg by mouth 2 (two) times daily.   omeprazole 40 MG capsule Commonly known as:   PRILOSEC Take 40 mg by mouth daily.   potassium chloride SA 20 MEQ tablet Commonly known as:  K-DUR,KLOR-CON Take 20 mEq by mouth daily.   simvastatin 20 MG tablet Commonly known as:  ZOCOR Take 20 mg by mouth daily.   tacrolimus 0.1 % ointment Commonly known as:  PROTOPIC Apply topically 2 (two) times daily as needed.   tamsulosin 0.4 MG Caps capsule Commonly known as:  FLOMAX Take 0.4 mg by mouth daily.   traMADol 50 MG tablet Commonly known as:  ULTRAM TK 1 T PO Q 8 H PRN P   vitamin B-12 1000 MCG tablet Commonly known as:  CYANOCOBALAMIN Take 1,000 mcg by mouth daily.   Vitamin C 500 MG Caps Take 1 capsule by mouth daily.       Allergies:  Allergies  Allergen Reactions  . Ibuprofen Itching  . Indomethacin   . Infliximab   . Methotrexate Derivatives   . Moexipril   . Percocet [Oxycodone-Acetaminophen] Itching  . Naprosyn [Naproxen] Rash    Family History: Family History  Problem Relation Age of Onset  . Breast cancer Neg Hx     Social History:  reports that she has never smoked. She has never used smokeless tobacco. She reports that she does not drink alcohol or use drugs.  ROS: UROLOGY Frequent Urination?: No Hard to postpone urination?: No Burning/pain with urination?: No Get up at night to urinate?: Yes Leakage of urine?: No Urine stream starts and stops?: No Trouble starting stream?: No Do you have to strain to urinate?: No Blood in urine?: No Urinary tract infection?: No Sexually transmitted disease?: No Injury to kidneys or bladder?: No Painful intercourse?: No Weak stream?: No Currently pregnant?: No Vaginal bleeding?: No Last menstrual period?: n  Gastrointestinal Nausea?: No Vomiting?: No Indigestion/heartburn?: No Diarrhea?: No Constipation?: No  Constitutional Fever: No Night sweats?: No Weight loss?: No Fatigue?: No  Skin Skin rash/lesions?: No Itching?: No  Eyes Blurred vision?: No Double vision?:  No  Ears/Nose/Throat Sore throat?: No Sinus problems?: No  Hematologic/Lymphatic Swollen glands?: No Easy bruising?: No  Cardiovascular Leg swelling?: No Chest pain?: No  Respiratory Cough?: No Shortness of breath?: No  Endocrine Excessive thirst?: No  Musculoskeletal Back pain?: Yes Joint pain?: No  Neurological Headaches?: Yes Dizziness?: No  Psychologic Depression?: No Anxiety?: No  Physical Exam: BP 137/76 (BP Location: Left Arm, Patient Position: Sitting, Cuff Size: Normal)   Pulse 85   Ht 5\' 6"  (1.676 m)   Wt 175 lb 3.2 oz (79.5 kg)   BMI 28.28 kg/m   Constitutional: Well nourished. Alert and oriented, No  acute distress. HEENT: [place] AT, moist mucus membranes. Trachea midline, no masses. Cardiovascular: No clubbing, cyanosis, or edema. Respiratory: Normal respiratory effort, no increased work of breathing. GI: Abdomen is soft, non tender, non distended, no abdominal masses. Liver and spleen not palpable.  No hernias appreciated.  Stool sample for occult testing is not indicated.   GU: No CVA tenderness.  No bladder fullness or masses.  Atrophic external genitalia, normal pubic hair distribution, no lesions.  Normal urethral meatus, no lesions, no prolapse, no discharge.   No urethral masses, tenderness and/or tenderness. No bladder fullness, tenderness or masses. Pale vagina mucosa, good estrogenization effect, no discharge, no lesions, poor pelvic support, grade I cystocele. No rectocele noted.  Cervix, uterus and adnexa are surgically absent. Anus and perineum are without rashes or lesions.    Skin: No rashes, bruises or suspicious lesions. Lymph: No cervical or inguinal adenopathy. Neurologic: Grossly intact, no focal deficits, moving all 4 extremities. Psychiatric: Normal mood and affect.  Laboratory Data: Lab Results  Component Value Date   WBC 6.3 06/23/2014   HGB 10.4 (L) 01/14/2015   HCT 33.7 (L) 06/23/2014   MCV 92 06/23/2014   PLT 167  01/14/2015    Lab Results  Component Value Date   CREATININE 0.66 01/14/2015    No results found for: PSA  No results found for: TESTOSTERONE  Lab Results  Component Value Date   HGBA1C 6.0 01/04/2015    No results found for: TSH  No results found for: CHOL, HDL, CHOLHDL, VLDL, LDLCALC  Lab Results  Component Value Date   AST 23 12/22/2012   Lab Results  Component Value Date   ALT 20 12/22/2012   No components found for: ALKALINEPHOPHATASE No components found for: BILIRUBINTOTAL  No results found for: ESTRADIOL  Urinalysis 11-30 WBC's  I have reviewed the labs.   Pertinent Imaging: Results for QUANTA, ROBERTSHAW (MRN [id number]) as of 05/21/2018 23:10  Ref. Range 05/20/2018 11:36  Scan Result Unknown 346mL      Assessment & Plan:    1.  Incomplete bladder emptying Patient will continue tamsulosin 0.8 mg daily Will contact us if having difficulty urinating or suprapubic pain or seek treatment in the emergency room Her insurance will not cover renal ultrasound at this time RTC in 10/2018  2. Vaginal atrophy Continue applying the vaginal estrogen cream 3 nights weekly Patient does not need a refill at this time  3. History of keratinizing squamous metaplasia of bladder Patient return in January 2020 for cystoscopy She will continue the doxycycline -if continues to do well we will discontinue the doxycycline after his cystoscopy in January   Return in about 5 months (around 10/20/2018) for cystoscopy .  These notes generated with voice recognition software. I apologize for typographical errors.  Zara Council, PA-C  [facility] [address] [place], [place] [address] [phone]

## 2018-05-20 ENCOUNTER — Ambulatory Visit (INDEPENDENT_AMBULATORY_CARE_PROVIDER_SITE_OTHER): Payer: Medicare Other | Admitting: Urology

## 2018-05-20 ENCOUNTER — Encounter: Payer: Self-pay | Admitting: Urology

## 2018-05-20 VITALS — BP 137/76 | HR 85 | Ht 66.0 in | Wt 175.2 lb

## 2018-05-20 DIAGNOSIS — N3289 Other specified disorders of bladder: Secondary | ICD-10-CM | POA: Diagnosis not present

## 2018-05-20 DIAGNOSIS — N952 Postmenopausal atrophic vaginitis: Secondary | ICD-10-CM

## 2018-05-20 DIAGNOSIS — R339 Retention of urine, unspecified: Secondary | ICD-10-CM | POA: Diagnosis not present

## 2018-05-20 DIAGNOSIS — Z87440 Personal history of urinary (tract) infections: Secondary | ICD-10-CM

## 2018-05-20 DIAGNOSIS — N3946 Mixed incontinence: Secondary | ICD-10-CM | POA: Diagnosis not present

## 2018-05-20 LAB — URINALYSIS, COMPLETE
Bilirubin, UA: NEGATIVE
Glucose, UA: NEGATIVE
Ketones, UA: NEGATIVE
Nitrite, UA: NEGATIVE
PH UA: 6 (ref 5.0–7.5)
RBC, UA: NEGATIVE
Specific Gravity, UA: 1.015 (ref 1.005–1.030)
Urobilinogen, Ur: 0.2 mg/dL (ref 0.2–1.0)

## 2018-05-20 LAB — MICROSCOPIC EXAMINATION

## 2018-05-20 LAB — BLADDER SCAN AMB NON-IMAGING

## 2018-05-21 ENCOUNTER — Telehealth: Payer: Self-pay | Admitting: Urology

## 2018-05-21 NOTE — Telephone Encounter (Signed)
Please let Stephanie [name] know that her insurance will not cover renal ultrasound at this time.  With her creatinine remaining stable, we will continue to monitor her blood work and if the and creatinine increases we will obtain a renal ultrasound at that time

## 2018-05-22 NOTE — Telephone Encounter (Signed)
Patient notified

## 2018-06-19 ENCOUNTER — Ambulatory Visit: Payer: Medicare Other | Admitting: Student in an Organized Health Care Education/Training Program

## 2018-10-07 ENCOUNTER — Other Ambulatory Visit: Payer: Medicare Other | Admitting: Urology

## 2018-10-13 ENCOUNTER — Ambulatory Visit (INDEPENDENT_AMBULATORY_CARE_PROVIDER_SITE_OTHER): Payer: Medicare Other | Admitting: Urology

## 2018-10-13 ENCOUNTER — Encounter: Payer: Self-pay | Admitting: Urology

## 2018-10-13 VITALS — BP 166/83 | HR 93 | Ht 66.0 in | Wt 179.0 lb

## 2018-10-13 DIAGNOSIS — Z87440 Personal history of urinary (tract) infections: Secondary | ICD-10-CM | POA: Diagnosis not present

## 2018-10-13 LAB — URINALYSIS, COMPLETE
BILIRUBIN UA: NEGATIVE
Glucose, UA: NEGATIVE
KETONES UA: NEGATIVE
Nitrite, UA: NEGATIVE
RBC, UA: NEGATIVE
SPEC GRAV UA: 1.02 (ref 1.005–1.030)
Urobilinogen, Ur: 0.2 mg/dL (ref 0.2–1.0)
pH, UA: 5.5 (ref 5.0–7.5)

## 2018-10-13 LAB — MICROSCOPIC EXAMINATION: RBC, UA: NONE SEEN /hpf (ref 0–2)

## 2018-10-13 MED ORDER — LIDOCAINE HCL URETHRAL/MUCOSAL 2 % EX GEL
1.0000 "application " | Freq: Once | CUTANEOUS | Status: AC
  Administered 2018-10-13: 1 via URETHRAL

## 2018-10-13 NOTE — Progress Notes (Signed)
   10/13/18  CC:  Chief Complaint  Patient presents with  . Cysto    HPI: Refer to Shannon's note of 05/20/2018.  She has no complaints today.  Blood pressure (!) 166/83, pulse 93, height 5\' 6"  (1.676 m), weight 179 lb (81.2 kg). NED. A&Ox3.   No respiratory distress   Atrophic external genitalia with patent urethral meatus  Cystoscopy Procedure Note  Patient identification was confirmed, informed consent was obtained, and patient was prepped using Betadine solution.  Lidocaine jelly was administered per urethral meatus.    Procedure: - Flexible cystoscope introduced, without any difficulty.   - Thorough search of the bladder revealed:    normal urethral meatus    normal urothelium, no squamous metaplasia    no stones    no ulcers     no tumors    no urethral polyps    Moderate trabeculation  - Ureteral orifices were normal in position and appearance.  Post-Procedure: - Patient tolerated the procedure well  Assessment/ Plan: Unremarkable cystoscopy.  PVR today was 150 mL.  Follow-up with Larene Beach and 3 months for symptom check and bladder scan for PVR.   Abbie Sons, MD

## 2018-12-03 ENCOUNTER — Other Ambulatory Visit: Payer: Self-pay | Admitting: Internal Medicine

## 2018-12-03 DIAGNOSIS — Z1231 Encounter for screening mammogram for malignant neoplasm of breast: Secondary | ICD-10-CM

## 2019-01-15 ENCOUNTER — Ambulatory Visit: Payer: Medicare Other | Admitting: Urology

## 2019-03-06 ENCOUNTER — Ambulatory Visit (INDEPENDENT_AMBULATORY_CARE_PROVIDER_SITE_OTHER): Payer: Medicare Other | Admitting: Urology

## 2019-03-06 ENCOUNTER — Encounter: Payer: Self-pay | Admitting: Urology

## 2019-03-06 ENCOUNTER — Other Ambulatory Visit: Payer: Self-pay

## 2019-03-06 VITALS — BP 151/90 | HR 87 | Ht 66.0 in | Wt 169.0 lb

## 2019-03-06 DIAGNOSIS — N390 Urinary tract infection, site not specified: Secondary | ICD-10-CM

## 2019-03-06 DIAGNOSIS — R339 Retention of urine, unspecified: Secondary | ICD-10-CM | POA: Diagnosis not present

## 2019-03-06 DIAGNOSIS — Z87440 Personal history of urinary (tract) infections: Secondary | ICD-10-CM | POA: Diagnosis not present

## 2019-03-06 LAB — BLADDER SCAN AMB NON-IMAGING

## 2019-03-06 MED ORDER — TAMSULOSIN HCL 0.4 MG PO CAPS: 0.4000 mg | ORAL_CAPSULE | Freq: Every day | ORAL | 3 refills | Status: DC

## 2019-03-06 NOTE — Progress Notes (Signed)
03/06/2019 11:41 AM   [name] Apr 02, 1941 [id number]  Referring provider: Rusty Aus, MD [facility] [place], [place] [address]  Chief Complaint  Patient presents with  . 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  Her most recent cystoscopy was on 10/13/2018 and was NED.    She does not have a history of urinary tract infections and is on suppressive doxycycline daily.    She is post menopausal.   She is currently on vaginal estrogen cream.  She admits denies constipation and/or diarrhea.   She is drinking good amount of water daily.   She is not drinking alcoholic beverages.      Her PVR today is 369 mL.  She is not experiencing suprapubic pain or discomfort.  Patient denies any gross hematuria, dysuria or suprapubic/flank pain.  Patient denies any fevers, chills, nausea or vomiting.   Recent creatinine 0.9 on 02/10/2019.     PMH: Past Medical History:  Diagnosis Date  . Arthritis   . Asthma   . Cancer ([place]) 1996   colon  . Diabetes mellitus without complication ([place])   . GERD (gastroesophageal reflux disease)   . Glaucoma   . Hyperlipidemia   . Hypertension   . Lumbar disc disease   . Small bowel obstruction ([employer])   . Vitamin B 12 deficiency     Surgical History: Past Surgical History:  Procedure Laterality Date  . ABDOMINAL HYSTERECTOMY    . BACK SURGERY    . COLON SURGERY    . COLONOSCOPY WITH PROPOFOL N/A 05/22/2017   Procedure: COLONOSCOPY WITH PROPOFOL;  Surgeon: Manya Silvas, MD;  Location: [facility] ENDOSCOPY;  Service: Endoscopy;  Laterality: N/A;  . FRACTURE SURGERY    . KNEE ARTHROSCOPY    . LUMBAR LAMINECTOMY      Home Medications:  Allergies as of 03/06/2019      Reactions   Ibuprofen Itching   Indomethacin    Infliximab    Methotrexate Derivatives    Moexipril    Percocet [oxycodone-acetaminophen] Itching   Naprosyn [naproxen] Rash      Medication List       Accurate as of March 06, 2019 11:41 AM. If you have any questions, ask your nurse or doctor.        STOP taking these medications   predniSONE 10 MG tablet Commonly known as:  DELTASONE Stopped by:  Mitsugi Schrader, PA-C     TAKE these medications   amLODipine 10 MG tablet Commonly known as:  NORVASC Take 10 mg by mouth daily.   amoxicillin 500 MG capsule Commonly known as:  AMOXIL TAKE 4 CAPSULES BY MOUTH 1 TIME 1 HOUR BEFORE PROCEDURE   brimonidine 0.2 % ophthalmic solution Commonly known as:  ALPHAGAN Place 1 drop into both eyes 3 (three) times daily.   calcium carbonate 1250 (500 Ca) MG tablet Commonly known as:  OS-CAL - dosed in mg of elemental calcium Take 1 tablet by mouth daily.   Contour Test test strip Generic drug:  glucose blood TEST BS QD UTD   estradiol 0.1 MG/GM vaginal cream Commonly known as:  ESTRACE Place 1 Applicatorful vaginally at bedtime.   folic acid 1 MG tablet Commonly known as:  FOLVITE Take 1 mg by mouth daily.  glimepiride 1 MG tablet Commonly known as:  AMARYL Take 1 mg by mouth 2 days. Take 0.5 tablet every other day   hydrochlorothiazide 25 MG tablet Commonly known as:  HYDRODIURIL Take 25 mg by mouth daily.   leflunomide 10 MG tablet Commonly known as:  ARAVA Take 10 mg by mouth daily.   magnesium oxide 400 MG tablet Commonly known as:  MAG-OX Take 400 mg by mouth daily.   metFORMIN 500 MG 24 hr tablet Commonly known as:  GLUCOPHAGE-XR Take 500 mg by mouth 2 (two) times daily.   metoprolol succinate 50 MG 24 hr tablet Commonly known as:  TOPROL-XL   metoprolol tartrate 50 MG tablet Commonly known as:  LOPRESSOR Take 50 mg by mouth 2 (two) times daily.   omeprazole 40 MG capsule Commonly known as:  PRILOSEC Take 40 mg by mouth daily.    potassium chloride SA 20 MEQ tablet Commonly known as:  K-DUR Take 20 mEq by mouth daily.   simvastatin 20 MG tablet Commonly known as:  ZOCOR Take 20 mg by mouth daily.   tacrolimus 0.1 % ointment Commonly known as:  PROTOPIC Apply topically 2 (two) times daily as needed.   tamsulosin 0.4 MG Caps capsule Commonly known as:  FLOMAX Take 0.4 mg by mouth daily.   vitamin B-12 1000 MCG tablet Commonly known as:  CYANOCOBALAMIN Take 1,000 mcg by mouth daily.   Vitamin C 500 MG Caps Take 1 capsule by mouth daily.       Allergies:  Allergies  Allergen Reactions  . Ibuprofen Itching  . Indomethacin   . Infliximab   . Methotrexate Derivatives   . Moexipril   . Percocet [Oxycodone-Acetaminophen] Itching  . Naprosyn [Naproxen] Rash    Family History: Family History  Problem Relation Age of Onset  . Breast cancer Neg Hx     Social History:  reports that she has never smoked. She has never used smokeless tobacco. She reports that she does not drink alcohol or use drugs.  ROS: UROLOGY Frequent Urination?: No Hard to postpone urination?: No Burning/pain with urination?: No Get up at night to urinate?: Yes Leakage of urine?: No Urine stream starts and stops?: No Trouble starting stream?: No Do you have to strain to urinate?: No Blood in urine?: No Urinary tract infection?: No Sexually transmitted disease?: No Injury to kidneys or bladder?: No Painful intercourse?: No Weak stream?: No Currently pregnant?: No Vaginal bleeding?: No Last menstrual period?: n  Gastrointestinal Nausea?: No Vomiting?: No Indigestion/heartburn?: No Diarrhea?: No Constipation?: No  Constitutional Fever: No Night sweats?: No Weight loss?: No Fatigue?: No  Skin Skin rash/lesions?: No Itching?: No  Eyes Blurred vision?: No Double vision?: No  Ears/Nose/Throat Sore throat?: No Sinus problems?: No  Hematologic/Lymphatic Swollen glands?: No Easy bruising?: No   Cardiovascular Leg swelling?: No Chest pain?: No  Respiratory Cough?: No Shortness of breath?: No  Endocrine Excessive thirst?: No  Musculoskeletal Back pain?: No Joint pain?: No  Neurological Headaches?: No Dizziness?: No  Psychologic Depression?: No Anxiety?: No  Physical Exam: BP (!) 151/90   Pulse 87   Ht 5\' 6"  (1.676 m)   Wt 169 lb (76.7 kg)   BMI 27.28 kg/m   Constitutional:  Well nourished. Alert and oriented, No acute distress. HEENT: [place] AT, moist mucus membranes.  Trachea midline, no masses. Cardiovascular: No clubbing, cyanosis, or edema. Respiratory: Normal respiratory effort, no increased work of breathing. Neurologic: Grossly intact, no focal deficits, moving all 4 extremities. Psychiatric: Normal mood  and affect.  Laboratory Data: Lab Results  Component Value Date   WBC 6.3 06/23/2014   HGB 10.4 (L) 01/14/2015   HCT 33.7 (L) 06/23/2014   MCV 92 06/23/2014   PLT 167 01/14/2015    Lab Results  Component Value Date   CREATININE 0.66 01/14/2015    No results found for: PSA  No results found for: TESTOSTERONE  Lab Results  Component Value Date   HGBA1C 6.0 01/04/2015    No results found for: TSH  No results found for: CHOL, HDL, CHOLHDL, VLDL, LDLCALC  Lab Results  Component Value Date   AST 23 12/22/2012   Lab Results  Component Value Date   ALT 20 12/22/2012   No components found for: ALKALINEPHOPHATASE No components found for: BILIRUBINTOTAL  No results found for: ESTRADIOL  I have reviewed the labs.   Pertinent Imaging:  Results for ALECEA, TREGO (MRN [id number]) as of 03/09/2019 12:53  Ref. Range 03/06/2019 11:25  Scan Result Unknown 34ml     Assessment & Plan:    1.  Incomplete bladder emptying Patient will continue tamsulosin 0.8 mg daily Explained that even though she is not experiencing pain, she can have damage to her kidneys with large residuals and recommend a RUS at this time RUS scheduled   2. Vaginal  atrophy Continue applying the vaginal estrogen cream 3 nights weekly Patient does not need a refill at this time  3. History of keratinizing squamous metaplasia of bladder Recent cysto - NED Discontinue doxycycline  Return for virtual visit for rus report .  These notes generated with voice recognition software. I apologize for typographical errors.  Zara Council, PA-C  [facility] [address] [place], [place] [address] [phone]

## 2019-03-18 ENCOUNTER — Ambulatory Visit
Admission: RE | Admit: 2019-03-18 | Discharge: 2019-03-18 | Disposition: A | Discharge status: Home / Self Care | Payer: Medicare Other | Source: Ambulatory Visit | Attending: Internal Medicine | Admitting: Internal Medicine

## 2019-03-18 ENCOUNTER — Other Ambulatory Visit: Payer: Self-pay

## 2019-03-18 DIAGNOSIS — Z1231 Encounter for screening mammogram for malignant neoplasm of breast: Secondary | ICD-10-CM | POA: Diagnosis not present

## 2019-03-20 ENCOUNTER — Other Ambulatory Visit: Payer: Self-pay

## 2019-03-20 ENCOUNTER — Ambulatory Visit
Admission: RE | Admit: 2019-03-20 | Discharge: 2019-03-20 | Disposition: A | Discharge status: Home / Self Care | Payer: Medicare Other | Source: Ambulatory Visit | Attending: Urology | Admitting: Urology

## 2019-03-20 DIAGNOSIS — N390 Urinary tract infection, site not specified: Secondary | ICD-10-CM

## 2019-03-31 ENCOUNTER — Other Ambulatory Visit: Payer: Self-pay

## 2019-03-31 ENCOUNTER — Telehealth (INDEPENDENT_AMBULATORY_CARE_PROVIDER_SITE_OTHER): Payer: Medicare Other | Admitting: Urology

## 2019-03-31 ENCOUNTER — Telehealth: Payer: Self-pay | Admitting: Urology

## 2019-03-31 DIAGNOSIS — R339 Retention of urine, unspecified: Secondary | ICD-10-CM

## 2019-03-31 MED ORDER — TAMSULOSIN HCL 0.4 MG PO CAPS: ORAL_CAPSULE | ORAL | 3 refills | Status: DC

## 2019-03-31 NOTE — Telephone Encounter (Signed)
Would you schedule an appointment for Stephanie [name] for a one year follow up for an OAB questionnaire and PVR?

## 2019-03-31 NOTE — Progress Notes (Signed)
Virtual Visit via Telephone Note  I connected with Stephanie [name] on 03/31/19 at  2:00 PM EDT by telephone and verified that I am speaking with the correct person using two identifiers.  Location: Patient: Home Provider: Home   I discussed the limitations, risks, security and privacy concerns of performing an evaluation and management service by telephone and the availability of in person appointments. I also discussed with the patient that there may be a patient responsible charge related to this service. The patient expressed understanding and agreed to proceed.   History of Present Illness: Stephanie [name] is a 63 female with incomplete emptying and vaginal atrophy who is contacted by telephone to discuss her renal ultrasound results.    Her PVR was 369 mL on 03/06/2019 and her recent creatinine was 0.9 on 02/10/2019.    Her RUS 03/20/2019 noted negative ultrasound appearance of the kidneys. Trace nonspecific perinephric fluid.  Echogenic liver consistent with steatosis and or hepatocellular disease.  She has no complaints at this time.  Patient denies any gross hematuria, dysuria or suprapubic/flank pain.  Patient denies any fevers, chills, nausea or vomiting.    Observations/Objective: CLINICAL DATA:  Recurrent UTI  EXAM: RENAL / URINARY TRACT ULTRASOUND COMPLETE  COMPARISON:  CT 11/23/2013  FINDINGS: Right Kidney:  Renal measurements: 10.3 x 4.6 x 5.8 cm = volume: 142.95 mL. Cortical echogenicity is normal. No hydronephrosis. Trace perinephric fluid.  Left Kidney:  Renal measurements: 10.5 x 5.1 x 5.4 cm = volume: 150.66 mL. Cortical echogenicity is normal. No hydronephrosis or mass. Trace perinephric fluid at the lower pole.  Bladder:  Appears normal for degree of bladder distention. Incidental note made of echogenic liver  IMPRESSION: 1. Negative ultrasound appearance of the kidneys. Trace nonspecific perinephric fluid 2. Echogenic liver consistent with  steatosis and or hepatocellular disease.   Electronically Signed   By: Donavan Foil M.D.   On: 03/21/2019 17:38 I have independently reviewed the films   Assessment and Plan:  1. Incomplete bladder emptying Discussed the RUS results with the patient Explained that carrying these large residuals puts her at risk rUTI's, bladder stones and/or renal damage Offered instruction for CIC - patient deferred Continue tamsulosin 0.8 mg daily   Follow Up Instructions:  Stephanie [name] will follow up in one year with OAB questionnaire, PVR and exam.   I discussed the assessment and treatment plan with the patient. The patient was provided an opportunity to ask questions and all were answered. The patient agreed with the plan and demonstrated an understanding of the instructions.   The patient was advised to call back or seek an in-person evaluation if the symptoms worsen or if the condition fails to improve as anticipated.  I provided 7 minutes of non-face-to-face time during this encounter.   Rockie Schnoor, PA-C

## 2019-07-05 ENCOUNTER — Emergency Department: Payer: Medicare Other

## 2019-07-05 ENCOUNTER — Inpatient Hospital Stay
Admission: EM | Admit: 2019-07-05 | Discharge: 2019-07-09 | DRG: 871 | Disposition: A | Discharge status: Home / Self Care | Payer: Medicare Other | Attending: Internal Medicine | Admitting: Internal Medicine

## 2019-07-05 ENCOUNTER — Other Ambulatory Visit: Payer: Self-pay

## 2019-07-05 DIAGNOSIS — E538 Deficiency of other specified B group vitamins: Secondary | ICD-10-CM | POA: Diagnosis present

## 2019-07-05 DIAGNOSIS — H409 Unspecified glaucoma: Secondary | ICD-10-CM | POA: Diagnosis present

## 2019-07-05 DIAGNOSIS — K56600 Partial intestinal obstruction, unspecified as to cause: Secondary | ICD-10-CM | POA: Diagnosis present

## 2019-07-05 DIAGNOSIS — Z888 Allergy status to other drugs, medicaments and biological substances status: Secondary | ICD-10-CM | POA: Diagnosis not present

## 2019-07-05 DIAGNOSIS — E872 Acidosis: Secondary | ICD-10-CM | POA: Diagnosis present

## 2019-07-05 DIAGNOSIS — Z79899 Other long term (current) drug therapy: Secondary | ICD-10-CM

## 2019-07-05 DIAGNOSIS — J45909 Unspecified asthma, uncomplicated: Secondary | ICD-10-CM | POA: Diagnosis present

## 2019-07-05 DIAGNOSIS — Z20828 Contact with and (suspected) exposure to other viral communicable diseases: Secondary | ICD-10-CM | POA: Diagnosis present

## 2019-07-05 DIAGNOSIS — I452 Bifascicular block: Secondary | ICD-10-CM | POA: Diagnosis present

## 2019-07-05 DIAGNOSIS — E782 Mixed hyperlipidemia: Secondary | ICD-10-CM | POA: Diagnosis present

## 2019-07-05 DIAGNOSIS — R509 Fever, unspecified: Secondary | ICD-10-CM

## 2019-07-05 DIAGNOSIS — I10 Essential (primary) hypertension: Secondary | ICD-10-CM | POA: Diagnosis present

## 2019-07-05 DIAGNOSIS — J189 Pneumonia, unspecified organism: Secondary | ICD-10-CM | POA: Diagnosis present

## 2019-07-05 DIAGNOSIS — Z23 Encounter for immunization: Secondary | ICD-10-CM | POA: Diagnosis present

## 2019-07-05 DIAGNOSIS — Z7984 Long term (current) use of oral hypoglycemic drugs: Secondary | ICD-10-CM

## 2019-07-05 DIAGNOSIS — A4150 Gram-negative sepsis, unspecified: Secondary | ICD-10-CM | POA: Diagnosis present

## 2019-07-05 DIAGNOSIS — K219 Gastro-esophageal reflux disease without esophagitis: Secondary | ICD-10-CM | POA: Diagnosis present

## 2019-07-05 DIAGNOSIS — A419 Sepsis, unspecified organism: Secondary | ICD-10-CM | POA: Diagnosis present

## 2019-07-05 DIAGNOSIS — N390 Urinary tract infection, site not specified: Secondary | ICD-10-CM | POA: Diagnosis present

## 2019-07-05 DIAGNOSIS — E876 Hypokalemia: Secondary | ICD-10-CM | POA: Diagnosis present

## 2019-07-05 DIAGNOSIS — J9601 Acute respiratory failure with hypoxia: Secondary | ICD-10-CM | POA: Diagnosis present

## 2019-07-05 DIAGNOSIS — Z87440 Personal history of urinary (tract) infections: Secondary | ICD-10-CM

## 2019-07-05 DIAGNOSIS — N289 Disorder of kidney and ureter, unspecified: Secondary | ICD-10-CM | POA: Diagnosis present

## 2019-07-05 DIAGNOSIS — Z885 Allergy status to narcotic agent status: Secondary | ICD-10-CM

## 2019-07-05 DIAGNOSIS — E1169 Type 2 diabetes mellitus with other specified complication: Secondary | ICD-10-CM | POA: Diagnosis present

## 2019-07-05 DIAGNOSIS — B9620 Unspecified Escherichia coli [E. coli] as the cause of diseases classified elsewhere: Secondary | ICD-10-CM | POA: Diagnosis present

## 2019-07-05 DIAGNOSIS — K529 Noninfective gastroenteritis and colitis, unspecified: Secondary | ICD-10-CM | POA: Diagnosis present

## 2019-07-05 LAB — GLUCOSE, CAPILLARY: Glucose-Capillary: 216 mg/dL — ABNORMAL HIGH (ref 70–99)

## 2019-07-05 LAB — COMPREHENSIVE METABOLIC PANEL
ALT: 19 U/L (ref 0–44)
AST: 23 U/L (ref 15–41)
Albumin: 4 g/dL (ref 3.5–5.0)
Alkaline Phosphatase: 103 U/L (ref 38–126)
Anion gap: 15 (ref 5–15)
BUN: 10 mg/dL (ref 8–23)
CO2: 24 mmol/L (ref 22–32)
Calcium: 9.3 mg/dL (ref 8.9–10.3)
Chloride: 99 mmol/L (ref 98–111)
Creatinine, Ser: 0.93 mg/dL (ref 0.44–1.00)
GFR calc Af Amer: >60 mL/min (ref >60)
GFR calc non Af Amer: 60 mL/min — ABNORMAL LOW (ref >60)
Glucose, Bld: 299 mg/dL — ABNORMAL HIGH (ref 70–99)
Potassium: 3.5 mmol/L (ref 3.5–5.1)
Sodium: 138 mmol/L (ref 135–145)
Total Bilirubin: 1.2 mg/dL (ref 0.3–1.2)
Total Protein: 7.6 g/dL (ref 6.5–8.1)

## 2019-07-05 LAB — CBC WITH DIFFERENTIAL/PLATELET
Abs Immature Granulocytes: 0.05 10*3/uL (ref 0.00–0.07)
Basophils Absolute: 0.1 10*3/uL (ref 0.0–0.1)
Basophils Relative: 1 %
Eosinophils Absolute: 0 10*3/uL (ref 0.0–0.5)
Eosinophils Relative: 0 %
HCT: 40 % (ref 36.0–46.0)
Hemoglobin: 13 g/dL (ref 12.0–15.0)
Immature Granulocytes: 0 %
Lymphocytes Relative: 5 %
Lymphs Abs: 0.8 10*3/uL (ref 0.7–4.0)
MCH: 28.1 pg (ref 26.0–34.0)
MCHC: 32.5 g/dL (ref 30.0–36.0)
MCV: 86.4 fL (ref 80.0–100.0)
Monocytes Absolute: 1.1 10*3/uL — ABNORMAL HIGH (ref 0.1–1.0)
Monocytes Relative: 7 %
Neutro Abs: 13.1 10*3/uL — ABNORMAL HIGH (ref 1.7–7.7)
Neutrophils Relative %: 87 %
Platelets: 223 10*3/uL (ref 150–400)
RBC: 4.63 MIL/uL (ref 3.87–5.11)
RDW: 13.2 % (ref 11.5–15.5)
WBC: 15.1 10*3/uL — ABNORMAL HIGH (ref 4.0–10.5)
nRBC: 0 % (ref 0.0–0.2)

## 2019-07-05 LAB — SARS CORONAVIRUS 2 BY RT PCR (HOSPITAL ORDER, PERFORMED IN ~~LOC~~ HOSPITAL LAB): SARS Coronavirus 2: NEGATIVE

## 2019-07-05 LAB — CBC
HCT: 39.8 % (ref 36.0–46.0)
Hemoglobin: 13 g/dL (ref 12.0–15.0)
MCH: 28.1 pg (ref 26.0–34.0)
MCHC: 32.7 g/dL (ref 30.0–36.0)
MCV: 86 fL (ref 80.0–100.0)
Platelets: 220 10*3/uL (ref 150–400)
RBC: 4.63 MIL/uL (ref 3.87–5.11)
RDW: 13.1 % (ref 11.5–15.5)
WBC: 15 10*3/uL — ABNORMAL HIGH (ref 4.0–10.5)
nRBC: 0 % (ref 0.0–0.2)

## 2019-07-05 LAB — LACTIC ACID, PLASMA: Lactic Acid, Venous: 2.3 mmol/L (ref 0.5–1.9)

## 2019-07-05 LAB — LIPASE, BLOOD: Lipase: 19 U/L (ref 11–51)

## 2019-07-05 MED ORDER — ONDANSETRON HCL 4 MG PO TABS: 4.0000 mg | ORAL_TABLET | Freq: Four times a day (QID) | ORAL | Status: DC | PRN

## 2019-07-05 MED ORDER — HYDROCHLOROTHIAZIDE 25 MG PO TABS
25.0000 mg | ORAL_TABLET | Freq: Every day | ORAL | Status: DC
  Administered 2019-07-06 – 2019-07-09 (×4): 25 mg via ORAL
  Filled 2019-07-05 (×4): qty 1

## 2019-07-05 MED ORDER — ACETAMINOPHEN 325 MG PO TABS
650.0000 mg | ORAL_TABLET | Freq: Four times a day (QID) | ORAL | Status: DC | PRN
  Administered 2019-07-06 – 2019-07-08 (×6): 650 mg via ORAL
  Filled 2019-07-05 (×6): qty 2

## 2019-07-05 MED ORDER — AMLODIPINE BESYLATE 10 MG PO TABS
10.0000 mg | ORAL_TABLET | Freq: Every day | ORAL | Status: DC
  Administered 2019-07-06 – 2019-07-09 (×4): 10 mg via ORAL
  Filled 2019-07-05 (×4): qty 1

## 2019-07-05 MED ORDER — SODIUM CHLORIDE 0.9% FLUSH: 3.0000 mL | Freq: Once | INTRAVENOUS | Status: DC

## 2019-07-05 MED ORDER — SODIUM CHLORIDE 0.9 % IV BOLUS
1000.0000 mL | Freq: Once | INTRAVENOUS | Status: AC
  Administered 2019-07-05: 16:00:00 1000 mL via INTRAVENOUS

## 2019-07-05 MED ORDER — SODIUM CHLORIDE 0.9 % IV SOLN
500.0000 mg | INTRAVENOUS | Status: DC
  Administered 2019-07-05: 500 mg via INTRAVENOUS
  Filled 2019-07-05: qty 500

## 2019-07-05 MED ORDER — PANTOPRAZOLE SODIUM 40 MG PO TBEC
40.0000 mg | DELAYED_RELEASE_TABLET | Freq: Every day | ORAL | Status: DC
  Administered 2019-07-06 – 2019-07-09 (×4): 40 mg via ORAL
  Filled 2019-07-05 (×4): qty 1

## 2019-07-05 MED ORDER — METOPROLOL TARTRATE 50 MG PO TABS
50.0000 mg | ORAL_TABLET | Freq: Two times a day (BID) | ORAL | Status: DC
  Administered 2019-07-05 – 2019-07-09 (×8): 50 mg via ORAL
  Filled 2019-07-05 (×8): qty 1

## 2019-07-05 MED ORDER — LEFLUNOMIDE 20 MG PO TABS
10.0000 mg | ORAL_TABLET | Freq: Every day | ORAL | Status: DC
  Administered 2019-07-06 – 2019-07-09 (×4): 10 mg via ORAL
  Filled 2019-07-05 (×4): qty 0.5

## 2019-07-05 MED ORDER — ONDANSETRON HCL 4 MG/2ML IJ SOLN
4.0000 mg | Freq: Once | INTRAMUSCULAR | Status: AC
  Administered 2019-07-05: 19:00:00 4 mg via INTRAVENOUS
  Filled 2019-07-05: qty 2

## 2019-07-05 MED ORDER — IOHEXOL 300 MG/ML  SOLN
100.0000 mL | Freq: Once | INTRAMUSCULAR | Status: AC | PRN
  Administered 2019-07-05: 19:00:00 100 mL via INTRAVENOUS

## 2019-07-05 MED ORDER — ORAL CARE MOUTH RINSE
15.0000 mL | Freq: Two times a day (BID) | OROMUCOSAL | Status: DC
  Administered 2019-07-06 – 2019-07-07 (×4): 15 mL via OROMUCOSAL

## 2019-07-05 MED ORDER — INSULIN ASPART 100 UNIT/ML ~~LOC~~ SOLN
0.0000 [IU] | Freq: Three times a day (TID) | SUBCUTANEOUS | Status: DC
  Administered 2019-07-06: 3 [IU] via SUBCUTANEOUS
  Administered 2019-07-06 – 2019-07-07 (×3): 2 [IU] via SUBCUTANEOUS
  Filled 2019-07-05 (×4): qty 1

## 2019-07-05 MED ORDER — SODIUM CHLORIDE 0.9 % IV SOLN
INTRAVENOUS | Status: DC
  Administered 2019-07-05: 23:00:00 via INTRAVENOUS

## 2019-07-05 MED ORDER — SIMVASTATIN 20 MG PO TABS
20.0000 mg | ORAL_TABLET | Freq: Every day | ORAL | Status: DC
  Administered 2019-07-06 – 2019-07-08 (×3): 20 mg via ORAL
  Filled 2019-07-05 (×4): qty 1

## 2019-07-05 MED ORDER — ONDANSETRON HCL 4 MG/2ML IJ SOLN
4.0000 mg | Freq: Four times a day (QID) | INTRAMUSCULAR | Status: DC | PRN
  Administered 2019-07-07: 4 mg via INTRAVENOUS
  Filled 2019-07-05: qty 2

## 2019-07-05 MED ORDER — BRIMONIDINE TARTRATE 0.2 % OP SOLN
1.0000 [drp] | Freq: Three times a day (TID) | OPHTHALMIC | Status: DC
  Administered 2019-07-05 – 2019-07-06 (×2): 1 [drp] via OPHTHALMIC
  Filled 2019-07-05: qty 5

## 2019-07-05 MED ORDER — SODIUM CHLORIDE 0.9 % IV SOLN
2.0000 g | INTRAVENOUS | Status: DC
  Administered 2019-07-05: 2 g via INTRAVENOUS
  Filled 2019-07-05: qty 20

## 2019-07-05 MED ORDER — ACETAMINOPHEN 650 MG RE SUPP: 650.0000 mg | Freq: Four times a day (QID) | RECTAL | Status: DC | PRN

## 2019-07-05 MED ORDER — INSULIN ASPART 100 UNIT/ML ~~LOC~~ SOLN
0.0000 [IU] | Freq: Every day | SUBCUTANEOUS | Status: DC
  Administered 2019-07-05 – 2019-07-08 (×2): 2 [IU] via SUBCUTANEOUS
  Filled 2019-07-05 (×3): qty 1

## 2019-07-05 MED ORDER — ENOXAPARIN SODIUM 40 MG/0.4ML ~~LOC~~ SOLN
40.0000 mg | Freq: Every day | SUBCUTANEOUS | Status: DC
  Administered 2019-07-05 – 2019-07-08 (×4): 40 mg via SUBCUTANEOUS
  Filled 2019-07-05 (×4): qty 0.4

## 2019-07-05 NOTE — ED Notes (Signed)
Pt 89% on room air. Pt placed on 2L nasal cannula. Pt oxygen saturation now 92% on 2L. MD [facility] aware.

## 2019-07-05 NOTE — ED Triage Notes (Signed)
Pt c/o abd pain with N/V/D, fatigue, increased SOB since Saturday., was sent from [facility]. Pt is a/ox4 on arrival in NAD>

## 2019-07-05 NOTE — ED Notes (Signed)
ED TO INPATIENT HANDOFF REPORT  ED Nurse Name and Phone #: Maliek Schellhorn [phone]   S Name/Age/Gender Stephanie [name] 78 y.o. female Room/Bed: ED07A/ED07A  Code Status   Code Status: Not on file  Home/SNF/Other Home Patient oriented to: self, place, time and situation Is this baseline? Yes   Triage Complete: Triage complete  Chief Complaint shortness of breath, nausea  Triage Note Pt c/o abd pain with N/V/D, fatigue, increased SOB since Saturday., was sent from [facility]. Pt is a/ox4 on arrival in NAD>    Allergies Allergies  Allergen Reactions  . Ibuprofen Itching  . Indomethacin   . Infliximab   . Methotrexate Derivatives   . Moexipril   . Percocet [Oxycodone-Acetaminophen] Itching  . Naprosyn [Naproxen] Rash    Level of Care/Admitting Diagnosis ED Disposition    ED Disposition Condition [facility]: [place] [[id number]]  Level of Care: Med-Surg [16]  Covid Evaluation: Confirmed COVID Negative  Diagnosis: Sepsis [facility]) [[id number]]  Admitting Physician: Lance Coon [[id number]]  Attending Physician: Lance Coon [phone]  Estimated length of stay: past midnight tomorrow  Certification:: I certify this patient will need inpatient services for at least 2 midnights  PT Class (Do Not Modify): Inpatient [101]  PT Acc Code (Do Not Modify): Private [1]       B Medical/Surgery History Past Medical History:  Diagnosis Date  . Arthritis   . Asthma   . Cancer ([place]) 1996   colon  . Diabetes mellitus without complication ([place])   . GERD (gastroesophageal reflux disease)   . Glaucoma   . Hyperlipidemia   . Hypertension   . Lumbar disc disease   . Small bowel obstruction ([place])   . Vitamin B 12 deficiency    Past Surgical History:  Procedure Laterality Date  . ABDOMINAL HYSTERECTOMY    . BACK SURGERY    . COLON SURGERY    . COLONOSCOPY WITH PROPOFOL N/A 05/22/2017   Procedure: COLONOSCOPY WITH PROPOFOL;  Surgeon: Manya Silvas, MD;   Location: [facility] ENDOSCOPY;  Service: Endoscopy;  Laterality: N/A;  . FRACTURE SURGERY    . KNEE ARTHROSCOPY    . LUMBAR LAMINECTOMY       A IV Location/Drains/Wounds Patient Lines/Drains/Airways Status   Active Line/Drains/Airways    Name:   Placement date:   Placement time:   Site:   Days:   Peripheral IV 07/05/19 Left Antecubital   07/05/19    1913    Antecubital   less than 1          Intake/Output Last 24 hours  Intake/Output Summary (Last 24 hours) at 07/05/2019 2231 Last data filed at 07/05/2019 2142 Gross per 24 hour  Intake 1350 ml  Output -  Net 1350 ml    Labs/Imaging Results for orders placed or performed during the hospital encounter of 07/05/19 (from the past 48 hour(s))  Lipase, blood     Status: None   Collection Time: 07/05/19  2:46 PM  Result Value Ref Range   Lipase 19 11 - 51 U/L    Comment: Performed at [facility], [address] [place], [place] [address]  Comprehensive metabolic panel     Status: Abnormal   Collection Time: 07/05/19  2:46 PM  Result Value Ref Range   Sodium 138 135 - 145 mmol/L   Potassium 3.5 3.5 - 5.1 mmol/L   Chloride 99 98 - 111 mmol/L   CO2 24 22 - 32 mmol/L   Glucose,  Bld 299 (H) 70 - 99 mg/dL   BUN 10 8 - 23 mg/dL   Creatinine, Ser 0.93 0.44 - 1.00 mg/dL   Calcium 9.3 8.9 - 10.3 mg/dL   Total Protein 7.6 6.5 - 8.1 g/dL   Albumin 4.0 3.5 - 5.0 g/dL   AST 23 15 - 41 U/L   ALT 19 0 - 44 U/L   Alkaline Phosphatase 103 38 - 126 U/L   Total Bilirubin 1.2 0.3 - 1.2 mg/dL   GFR calc non Af Amer 60 (L) >60 mL/min   GFR calc Af Amer >60 >60 mL/min   Anion gap 15 5 - 15    Comment: Performed at [facility], [address] [place], [place] [address]  CBC     Status: Abnormal   Collection Time: 07/05/19  2:46 PM  Result Value Ref Range   WBC 15.0 (H) 4.0 - 10.5 K/uL   RBC 4.63 3.87 - 5.11 MIL/uL   Hemoglobin 13.0 12.0 - 15.0 g/dL   HCT 39.8 36.0 - 46.0 %   MCV 86.0 80.0 - 100.0 fL   MCH 28.1 26.0 -  34.0 pg   MCHC 32.7 30.0 - 36.0 g/dL   RDW 13.1 11.5 - 15.5 %   Platelets 220 150 - 400 K/uL   nRBC 0.0 0.0 - 0.2 %    Comment: Performed at [facility], [address] [place], [place] [address]  CBC with Differential/Platelet     Status: Abnormal   Collection Time: 07/05/19  2:46 PM  Result Value Ref Range   WBC 15.1 (H) 4.0 - 10.5 K/uL   RBC 4.63 3.87 - 5.11 MIL/uL   Hemoglobin 13.0 12.0 - 15.0 g/dL   HCT 40.0 36.0 - 46.0 %   MCV 86.4 80.0 - 100.0 fL   MCH 28.1 26.0 - 34.0 pg   MCHC 32.5 30.0 - 36.0 g/dL   RDW 13.2 11.5 - 15.5 %   Platelets 223 150 - 400 K/uL   nRBC 0.0 0.0 - 0.2 %   Neutrophils Relative % 87 %   Neutro Abs 13.1 (H) 1.7 - 7.7 K/uL   Lymphocytes Relative 5 %   Lymphs Abs 0.8 0.7 - 4.0 K/uL   Monocytes Relative 7 %   Monocytes Absolute 1.1 (H) 0.1 - 1.0 K/uL   Eosinophils Relative 0 %   Eosinophils Absolute 0.0 0.0 - 0.5 K/uL   Basophils Relative 1 %   Basophils Absolute 0.1 0.0 - 0.1 K/uL   Immature Granulocytes 0 %   Abs Immature Granulocytes 0.05 0.00 - 0.07 K/uL    Comment: Performed at [facility], [address] [place], [place] [address]  SARS Coronavirus 2 [facility] order, Performed in [facility] hospital lab) Nasopharyngeal Nasopharyngeal Swab     Status: None   Collection Time: 07/05/19  4:12 PM   Specimen: Nasopharyngeal Swab  Result Value Ref Range   SARS Coronavirus 2 NEGATIVE NEGATIVE    Comment: (NOTE) If result is NEGATIVE SARS-CoV-2 target nucleic acids are NOT DETECTED. The SARS-CoV-2 RNA is generally detectable in upper and lower  respiratory specimens during the acute phase of infection. The lowest  concentration of SARS-CoV-2 viral copies this assay can detect is 250  copies / mL. A negative result does not preclude SARS-CoV-2 infection  and should not be used as the sole basis for treatment or other  patient management decisions.  A negative result may occur with  improper specimen collection / handling,  submission  of specimen other  than nasopharyngeal swab, presence of viral mutation(s) within the  areas targeted by this assay, and inadequate number of viral copies  (<250 copies / mL). A negative result must be combined with clinical  observations, patient history, and epidemiological information. If result is POSITIVE SARS-CoV-2 target nucleic acids are DETECTED. The SARS-CoV-2 RNA is generally detectable in upper and lower  respiratory specimens dur ing the acute phase of infection.  Positive  results are indicative of active infection with SARS-CoV-2.  Clinical  correlation with patient history and other diagnostic information is  necessary to determine patient infection status.  Positive results do  not rule out bacterial infection or co-infection with other viruses. If result is PRESUMPTIVE POSTIVE SARS-CoV-2 nucleic acids MAY BE PRESENT.   A presumptive positive result was obtained on the submitted specimen  and confirmed on repeat testing.  While 2019 novel coronavirus  (SARS-CoV-2) nucleic acids may be present in the submitted sample  additional confirmatory testing may be necessary for epidemiological  and / or clinical management purposes  to differentiate between  SARS-CoV-2 and other Sarbecovirus currently known to infect humans.  If clinically indicated additional testing with an alternate test  methodology [phone]) is advised. The SARS-CoV-2 RNA is generally  detectable in upper and lower respiratory sp ecimens during the acute  phase of infection. The expected result is Negative. Fact Sheet for Patients:  StrictlyIdeas.no Fact Sheet for Healthcare Providers: BankingDealers.co.za This test is not yet approved or cleared by the [place] FDA and has been authorized for detection and/or diagnosis of SARS-CoV-2 by FDA under an Emergency Use Authorization (EUA).  This EUA will remain in effect (meaning this test can be  used) for the duration of the COVID-19 declaration under Section 564(b)(1) of the Act, 21 U.S.C. section 360bbb-3(b)(1), unless the authorization is terminated or revoked sooner. Performed at [facility], [address] [place], [place] [address]   Lactic acid, plasma     Status: Abnormal   Collection Time: 07/05/19  6:12 PM  Result Value Ref Range   Lactic Acid, Venous 2.3 (HH) 0.5 - 1.9 mmol/L    Comment: CRITICAL RESULT CALLED TO, READ BACK BY AND VERIFIED WITH AMY SMITH RN AT 1730 07/05/2019. MSS Performed at [facility], [place], [place] [address]    Ct Chest W Contrast  Result Date: 07/05/2019 CLINICAL DATA:  78 year old female with history of acute generalized abdominal pain with nausea, vomiting and diarrhea, with fatigue. EXAM: CT CHEST, ABDOMEN, AND PELVIS WITH CONTRAST TECHNIQUE: Multidetector CT imaging of the chest, abdomen and pelvis was performed following the standard protocol during bolus administration of intravenous contrast. CONTRAST:  142mL OMNIPAQUE IOHEXOL 300 MG/ML  SOLN COMPARISON:  CT the abdomen and pelvis 11/23/2013. FINDINGS: CT CHEST FINDINGS Cardiovascular: Heart size is normal. There is no significant pericardial fluid, thickening or pericardial calcification. There is aortic atherosclerosis, as well as atherosclerosis of the great vessels of the mediastinum and the coronary arteries, including calcified atherosclerotic plaque in the left main and left anterior descending coronary arteries. Mediastinum/Nodes: No pathologically enlarged mediastinal or hilar lymph nodes. Please note that accurate exclusion of hilar adenopathy is limited on noncontrast CT scans. Small hiatal hernia. No axillary lymphadenopathy. Lungs/Pleura: Several pulmonary nodules are noted in the lungs bilaterally, largest of which measures only 5 mm in the medial segment of the right middle lobe (axial image 85 of series 4). No larger more suspicious appearing  pulmonary nodules or masses are noted.  No acute consolidative airspace disease. No pleural effusions. Musculoskeletal: There are no aggressive appearing lytic or blastic lesions noted in the visualized portions of the skeleton. CT ABDOMEN PELVIS FINDINGS Hepatobiliary: No suspicious cystic or solid hepatic lesions. No intra or extrahepatic biliary ductal dilatation. Gallbladder is normal in appearance. Pancreas: No pancreatic mass. No pancreatic ductal dilatation. No pancreatic or peripancreatic fluid collections or inflammatory changes. Spleen: Unremarkable. Adrenals/Urinary Tract: Low-attenuation lesions in the kidneys bilaterally, compatible with simple cysts, measuring up to 1.5 cm in the lower pole collecting system of left kidney. Multiple other subcentimeter low-attenuation lesions, too small to characterize, but favored to represent tiny cysts. In addition, in the upper pole of the right kidney there is a poorly defined 1.5 cm intermediate attenuation lesion which is incompletely characterized (axial image 72 of series 2) which is new compared to the prior study from 2015, nonspecific. Mild right hydronephrosis. This terminates at the right ureteropelvic junction. No hydroureter. Urinary bladder is normal in appearance. Bilateral adrenal glands are normal in appearance. Stomach/Bowel: Normal appearance of the stomach. Multiple mildly dilated loops of small bowel measuring up to 3.7 cm in diameter with multiple air-fluid levels. Distal small bowel is relatively decompressed, as is the colon. Postoperative changes of partial colectomy and near the rectosigmoid junction. The appendix is not confidently identified and may be surgically absent. Regardless, there are no inflammatory changes noted adjacent to the cecum to suggest the presence of an acute appendicitis at this time. Vascular/Lymphatic: Aortic atherosclerosis, without evidence of aneurysm or dissection in the abdominal or pelvic vasculature.  Retroaortic left renal vein (normal anatomical variant). No lymphadenopathy noted in the abdomen or pelvis. Reproductive: Status post hysterectomy. Ovaries are not confidently identified may be surgically absent or atrophic Other: No significant volume of ascites.  No pneumoperitoneum. Musculoskeletal: Posterior rod and screw fixation device extending from T9 through L5. There are no aggressive appearing lytic or blastic lesions noted in the visualized portions of the skeleton. IMPRESSION: 1. Mild dilatation of multiple loops of mid small bowel with multiple air-fluid levels, with relative decompression of the distal small bowel and colon. Findings are concerning for early or partial small bowel obstruction. No discrete transition point confidently identified on today's examination. 2. No acute findings in the thorax. 3. 1.5 cm indeterminate lesion in the upper pole of the right kidney. Abdominal MRI with and without IV gadolinium is recommended in 3-6 months to re-evaluate this lesion and provide better characterization. 4. Aortic atherosclerosis, in addition to left main and left anterior descending coronary artery disease. Assessment for potential risk factor modification, dietary therapy or pharmacologic therapy may be warranted, if clinically indicated. 5. Additional incidental findings, as above. Electronically Signed   By: Vinnie Langton M.D.   On: 07/05/2019 19:55   Ct Abdomen Pelvis W Contrast  Result Date: 07/05/2019 CLINICAL DATA:  78 year old female with history of acute generalized abdominal pain with nausea, vomiting and diarrhea, with fatigue. EXAM: CT CHEST, ABDOMEN, AND PELVIS WITH CONTRAST TECHNIQUE: Multidetector CT imaging of the chest, abdomen and pelvis was performed following the standard protocol during bolus administration of intravenous contrast. CONTRAST:  116mL OMNIPAQUE IOHEXOL 300 MG/ML  SOLN COMPARISON:  CT the abdomen and pelvis 11/23/2013. FINDINGS: CT CHEST FINDINGS  Cardiovascular: Heart size is normal. There is no significant pericardial fluid, thickening or pericardial calcification. There is aortic atherosclerosis, as well as atherosclerosis of the great vessels of the mediastinum and the coronary arteries, including calcified atherosclerotic plaque in the left main and left anterior  descending coronary arteries. Mediastinum/Nodes: No pathologically enlarged mediastinal or hilar lymph nodes. Please note that accurate exclusion of hilar adenopathy is limited on noncontrast CT scans. Small hiatal hernia. No axillary lymphadenopathy. Lungs/Pleura: Several pulmonary nodules are noted in the lungs bilaterally, largest of which measures only 5 mm in the medial segment of the right middle lobe (axial image 85 of series 4). No larger more suspicious appearing pulmonary nodules or masses are noted. No acute consolidative airspace disease. No pleural effusions. Musculoskeletal: There are no aggressive appearing lytic or blastic lesions noted in the visualized portions of the skeleton. CT ABDOMEN PELVIS FINDINGS Hepatobiliary: No suspicious cystic or solid hepatic lesions. No intra or extrahepatic biliary ductal dilatation. Gallbladder is normal in appearance. Pancreas: No pancreatic mass. No pancreatic ductal dilatation. No pancreatic or peripancreatic fluid collections or inflammatory changes. Spleen: Unremarkable. Adrenals/Urinary Tract: Low-attenuation lesions in the kidneys bilaterally, compatible with simple cysts, measuring up to 1.5 cm in the lower pole collecting system of left kidney. Multiple other subcentimeter low-attenuation lesions, too small to characterize, but favored to represent tiny cysts. In addition, in the upper pole of the right kidney there is a poorly defined 1.5 cm intermediate attenuation lesion which is incompletely characterized (axial image 72 of series 2) which is new compared to the prior study from 2015, nonspecific. Mild right hydronephrosis. This  terminates at the right ureteropelvic junction. No hydroureter. Urinary bladder is normal in appearance. Bilateral adrenal glands are normal in appearance. Stomach/Bowel: Normal appearance of the stomach. Multiple mildly dilated loops of small bowel measuring up to 3.7 cm in diameter with multiple air-fluid levels. Distal small bowel is relatively decompressed, as is the colon. Postoperative changes of partial colectomy and near the rectosigmoid junction. The appendix is not confidently identified and may be surgically absent. Regardless, there are no inflammatory changes noted adjacent to the cecum to suggest the presence of an acute appendicitis at this time. Vascular/Lymphatic: Aortic atherosclerosis, without evidence of aneurysm or dissection in the abdominal or pelvic vasculature. Retroaortic left renal vein (normal anatomical variant). No lymphadenopathy noted in the abdomen or pelvis. Reproductive: Status post hysterectomy. Ovaries are not confidently identified may be surgically absent or atrophic Other: No significant volume of ascites.  No pneumoperitoneum. Musculoskeletal: Posterior rod and screw fixation device extending from T9 through L5. There are no aggressive appearing lytic or blastic lesions noted in the visualized portions of the skeleton. IMPRESSION: 1. Mild dilatation of multiple loops of mid small bowel with multiple air-fluid levels, with relative decompression of the distal small bowel and colon. Findings are concerning for early or partial small bowel obstruction. No discrete transition point confidently identified on today's examination. 2. No acute findings in the thorax. 3. 1.5 cm indeterminate lesion in the upper pole of the right kidney. Abdominal MRI with and without IV gadolinium is recommended in 3-6 months to re-evaluate this lesion and provide better characterization. 4. Aortic atherosclerosis, in addition to left main and left anterior descending coronary artery disease.  Assessment for potential risk factor modification, dietary therapy or pharmacologic therapy may be warranted, if clinically indicated. 5. Additional incidental findings, as above. Electronically Signed   By: Vinnie Langton M.D.   On: 07/05/2019 19:55   Dg Chest Portable 1 View  Result Date: 07/05/2019 CLINICAL DATA:  Body aches.  Fever. EXAM: PORTABLE CHEST 1 VIEW COMPARISON:  03/15/2008 chest radiograph. FINDINGS: Partially visualized bilateral posterior spinal fusion hardware extending inferiorly from the lower thoracic spine. Stable cardiomediastinal silhouette with normal heart size. No  pneumothorax. No pleural effusion. Mild curvilinear left lung base opacity. Otherwise clear lungs. IMPRESSION: Mild curvilinear left lung base opacity, favoring scarring versus atelectasis. Otherwise no active disease. Electronically Signed   By: Ilona Sorrel M.D.   On: 07/05/2019 16:50    Pending Labs Unresulted Labs (From admission, onward)    Start     Ordered   07/05/19 1612  Culture, blood (routine x 2)  BLOOD CULTURE X 2,   STAT     07/05/19 1612   07/05/19 1612  Urine culture  Once,   STAT     07/05/19 1612   07/05/19 1431  Urinalysis, Complete w Microscopic  ONCE - STAT,   STAT     07/05/19 1430   Signed and Held  Hemoglobin A1c  Once,   R    Comments: To assess prior glycemic control    Signed and Held   Signed and Held  CBC  (enoxaparin (LOVENOX)    CrCl >/= 30 ml/min)  Once,   R    Comments: Baseline for enoxaparin therapy IF NOT ALREADY DRAWN.  Notify MD if PLT < 100 K.    Signed and Held   Signed and Held  Creatinine, serum  (enoxaparin (LOVENOX)    CrCl >/= 30 ml/min)  Once,   R    Comments: Baseline for enoxaparin therapy IF NOT ALREADY DRAWN.    Signed and Held   Signed and Held  Creatinine, serum  (enoxaparin (LOVENOX)    CrCl >/= 30 ml/min)  Weekly,   R    Comments: while on enoxaparin therapy    Signed and Held   Signed and Held  Basic metabolic panel  Tomorrow morning,   R      Signed and Held   Signed and Held  CBC  Tomorrow morning,   R     Signed and Held          Vitals/Pain Today's Vitals   07/05/19 1800 07/05/19 2030 07/05/19 2100 07/05/19 2130  BP: (!) 189/90 (!) 161/82 (!) 160/95 (!) 175/85  Pulse: (!) 135 (!) 103  96  Resp: 16   14  Temp:      TempSrc:      SpO2: (!) 89% 94%  94%  Weight:      Height:      PainSc:    0-No pain    Isolation Precautions No active isolations  Medications Medications  sodium chloride flush (NS) 0.9 % injection 3 mL (3 mLs Intravenous Refused 07/05/19 1852)  cefTRIAXone (ROCEPHIN) 2 g in sodium chloride 0.9 % 100 mL IVPB (0 g Intravenous Stopped 07/05/19 1851)  azithromycin (ZITHROMAX) 500 mg in sodium chloride 0.9 % 250 mL IVPB (0 mg Intravenous Stopped 07/05/19 2142)  sodium chloride 0.9 % bolus 1,000 mL (0 mLs Intravenous Stopped 07/05/19 1700)  ondansetron (ZOFRAN) injection 4 mg (4 mg Intravenous Given 07/05/19 1831)  iohexol (OMNIPAQUE) 300 MG/ML solution 100 mL (100 mLs Intravenous Contrast Given 07/05/19 1858)    Mobility walks with person assist Low fall risk   Focused Assessments Cardiac Assessment Handoff:    No results found for: CKTOTAL, CKMB, CKMBINDEX, TROPONINI No results found for: DDIMER Does the Patient currently have chest pain? Yes      R Recommendations: See Admitting Provider Note  Report given to:   Additional Notes:

## 2019-07-05 NOTE — ED Notes (Signed)
.. ED TO INPATIENT HANDOFF REPORT  ED Nurse Name and Phone #: Deneise Lever [phone]  S Name/Age/Gender Stephanie [name] 78 y.o. female Room/Bed: ED07A/ED07A  Code Status   Code Status: Not on file  Home/SNF/Other Home Patient oriented to: self, place, time and situation Is this baseline? Yes   Triage Complete: Triage complete  Chief Complaint shortness of breath, nausea  Triage Note Pt c/o abd pain with N/V/D, fatigue, increased SOB since Saturday., was sent from [facility]. Pt is a/ox4 on arrival in NAD>    Allergies Allergies  Allergen Reactions  . Ibuprofen Itching  . Indomethacin   . Infliximab   . Methotrexate Derivatives   . Moexipril   . Percocet [Oxycodone-Acetaminophen] Itching  . Naprosyn [Naproxen] Rash    Level of Care/Admitting Diagnosis ED Disposition    ED Disposition Condition Comment   Admit  The patient appears reasonably stabilized for admission considering the current resources, flow, and capabilities available in the ED at this time, and I doubt any other [facility] requiring further screening and/or treatment in the ED prior to admission is  present.       B Medical/Surgery History Past Medical History:  Diagnosis Date  . Arthritis   . Asthma   . Cancer ([place]) 1996   colon  . Diabetes mellitus without complication ([place])   . GERD (gastroesophageal reflux disease)   . Glaucoma   . Hyperlipidemia   . Hypertension   . Lumbar disc disease   . Small bowel obstruction ([employer])   . Vitamin B 12 deficiency    Past Surgical History:  Procedure Laterality Date  . ABDOMINAL HYSTERECTOMY    . BACK SURGERY    . COLON SURGERY    . COLONOSCOPY WITH PROPOFOL N/A 05/22/2017   Procedure: COLONOSCOPY WITH PROPOFOL;  Surgeon: Manya Silvas, MD;  Location: [facility] ENDOSCOPY;  Service: Endoscopy;  Laterality: N/A;  . FRACTURE SURGERY    . KNEE ARTHROSCOPY    . LUMBAR LAMINECTOMY       A IV Location/Drains/Wounds Patient Lines/Drains/Airways Status   Active  Line/Drains/Airways    Name:   Placement date:   Placement time:   Site:   Days:   Peripheral IV 07/05/19 Right Antecubital   07/05/19    1627    Antecubital   less than 1   Peripheral IV 07/05/19 Left Antecubital   07/05/19    1913    Antecubital   less than 1          Intake/Output Last 24 hours  Intake/Output Summary (Last 24 hours) at 07/05/2019 2140 Last data filed at 07/05/2019 1851 Gross per 24 hour  Intake 1100 ml  Output -  Net 1100 ml    Labs/Imaging Results for orders placed or performed during the hospital encounter of 07/05/19 (from the past 48 hour(s))  Lipase, blood     Status: None   Collection Time: 07/05/19  2:46 PM  Result Value Ref Range   Lipase 19 11 - 51 U/L    Comment: Performed at [facility], [address] [place], [place] [address]  Comprehensive metabolic panel     Status: Abnormal   Collection Time: 07/05/19  2:46 PM  Result Value Ref Range   Sodium 138 135 - 145 mmol/L   Potassium 3.5 3.5 - 5.1 mmol/L   Chloride 99 98 - 111 mmol/L   CO2 24 22 - 32 mmol/L   Glucose, Bld 299 (H) 70 - 99 mg/dL   BUN 10 8 -  23 mg/dL   Creatinine, Ser 0.93 0.44 - 1.00 mg/dL   Calcium 9.3 8.9 - 10.3 mg/dL   Total Protein 7.6 6.5 - 8.1 g/dL   Albumin 4.0 3.5 - 5.0 g/dL   AST 23 15 - 41 U/L   ALT 19 0 - 44 U/L   Alkaline Phosphatase 103 38 - 126 U/L   Total Bilirubin 1.2 0.3 - 1.2 mg/dL   GFR calc non Af Amer 60 (L) >60 mL/min   GFR calc Af Amer >60 >60 mL/min   Anion gap 15 5 - 15    Comment: Performed at [facility], [address] [place], [place] [address]  CBC     Status: Abnormal   Collection Time: 07/05/19  2:46 PM  Result Value Ref Range   WBC 15.0 (H) 4.0 - 10.5 K/uL   RBC 4.63 3.87 - 5.11 MIL/uL   Hemoglobin 13.0 12.0 - 15.0 g/dL   HCT 39.8 36.0 - 46.0 %   MCV 86.0 80.0 - 100.0 fL   MCH 28.1 26.0 - 34.0 pg   MCHC 32.7 30.0 - 36.0 g/dL   RDW 13.1 11.5 - 15.5 %   Platelets 220 150 - 400 K/uL   nRBC 0.0 0.0 - 0.2 %     Comment: Performed at [facility], [address] [place], [place] [address]  CBC with Differential/Platelet     Status: Abnormal   Collection Time: 07/05/19  2:46 PM  Result Value Ref Range   WBC 15.1 (H) 4.0 - 10.5 K/uL   RBC 4.63 3.87 - 5.11 MIL/uL   Hemoglobin 13.0 12.0 - 15.0 g/dL   HCT 40.0 36.0 - 46.0 %   MCV 86.4 80.0 - 100.0 fL   MCH 28.1 26.0 - 34.0 pg   MCHC 32.5 30.0 - 36.0 g/dL   RDW 13.2 11.5 - 15.5 %   Platelets 223 150 - 400 K/uL   nRBC 0.0 0.0 - 0.2 %   Neutrophils Relative % 87 %   Neutro Abs 13.1 (H) 1.7 - 7.7 K/uL   Lymphocytes Relative 5 %   Lymphs Abs 0.8 0.7 - 4.0 K/uL   Monocytes Relative 7 %   Monocytes Absolute 1.1 (H) 0.1 - 1.0 K/uL   Eosinophils Relative 0 %   Eosinophils Absolute 0.0 0.0 - 0.5 K/uL   Basophils Relative 1 %   Basophils Absolute 0.1 0.0 - 0.1 K/uL   Immature Granulocytes 0 %   Abs Immature Granulocytes 0.05 0.00 - 0.07 K/uL    Comment: Performed at [facility], [address] [place], [place] [address]  SARS Coronavirus 2 [facility] order, Performed in [facility] hospital lab) Nasopharyngeal Nasopharyngeal Swab     Status: None   Collection Time: 07/05/19  4:12 PM   Specimen: Nasopharyngeal Swab  Result Value Ref Range   SARS Coronavirus 2 NEGATIVE NEGATIVE    Comment: (NOTE) If result is NEGATIVE SARS-CoV-2 target nucleic acids are NOT DETECTED. The SARS-CoV-2 RNA is generally detectable in upper and lower  respiratory specimens during the acute phase of infection. The lowest  concentration of SARS-CoV-2 viral copies this assay can detect is 250  copies / mL. A negative result does not preclude SARS-CoV-2 infection  and should not be used as the sole basis for treatment or other  patient management decisions.  A negative result may occur with  improper specimen collection / handling, submission of specimen other  than nasopharyngeal swab, presence of viral mutation(s) within the  areas targeted by this  assay, and inadequate number of viral copies  (<250 copies / mL). A negative result must be combined with clinical  observations, patient history, and epidemiological information. If result is POSITIVE SARS-CoV-2 target nucleic acids are DETECTED. The SARS-CoV-2 RNA is generally detectable in upper and lower  respiratory specimens dur ing the acute phase of infection.  Positive  results are indicative of active infection with SARS-CoV-2.  Clinical  correlation with patient history and other diagnostic information is  necessary to determine patient infection status.  Positive results do  not rule out bacterial infection or co-infection with other viruses. If result is PRESUMPTIVE POSTIVE SARS-CoV-2 nucleic acids MAY BE PRESENT.   A presumptive positive result was obtained on the submitted specimen  and confirmed on repeat testing.  While 2019 novel coronavirus  (SARS-CoV-2) nucleic acids may be present in the submitted sample  additional confirmatory testing may be necessary for epidemiological  and / or clinical management purposes  to differentiate between  SARS-CoV-2 and other Sarbecovirus currently known to infect humans.  If clinically indicated additional testing with an alternate test  methodology [phone]) is advised. The SARS-CoV-2 RNA is generally  detectable in upper and lower respiratory sp ecimens during the acute  phase of infection. The expected result is Negative. Fact Sheet for Patients:  StrictlyIdeas.no Fact Sheet for Healthcare Providers: BankingDealers.co.za This test is not yet approved or cleared by the [place] FDA and has been authorized for detection and/or diagnosis of SARS-CoV-2 by FDA under an Emergency Use Authorization (EUA).  This EUA will remain in effect (meaning this test can be used) for the duration of the COVID-19 declaration under Section 564(b)(1) of the Act, 21 U.S.C. section 360bbb-3(b)(1),  unless the authorization is terminated or revoked sooner. Performed at [facility], [address] [place], [place] [address]   Lactic acid, plasma     Status: Abnormal   Collection Time: 07/05/19  6:12 PM  Result Value Ref Range   Lactic Acid, Venous 2.3 (HH) 0.5 - 1.9 mmol/L    Comment: CRITICAL RESULT CALLED TO, READ BACK BY AND VERIFIED WITH AMY Krithika Tome RN AT 1730 07/05/2019. MSS Performed at [facility], [place],  [address]    Ct Chest W Contrast  Result Date: 07/05/2019 CLINICAL DATA:  78 year old female with history of acute generalized abdominal pain with nausea, vomiting and diarrhea, with fatigue. EXAM: CT CHEST, ABDOMEN, AND PELVIS WITH CONTRAST TECHNIQUE: Multidetector CT imaging of the chest, abdomen and pelvis was performed following the standard protocol during bolus administration of intravenous contrast. CONTRAST:  16mL OMNIPAQUE IOHEXOL 300 MG/ML  SOLN COMPARISON:  CT the abdomen and pelvis 11/23/2013. FINDINGS: CT CHEST FINDINGS Cardiovascular: Heart size is normal. There is no significant pericardial fluid, thickening or pericardial calcification. There is aortic atherosclerosis, as well as atherosclerosis of the great vessels of the mediastinum and the coronary arteries, including calcified atherosclerotic plaque in the left main and left anterior descending coronary arteries. Mediastinum/Nodes: No pathologically enlarged mediastinal or hilar lymph nodes. Please note that accurate exclusion of hilar adenopathy is limited on noncontrast CT scans. Small hiatal hernia. No axillary lymphadenopathy. Lungs/Pleura: Several pulmonary nodules are noted in the lungs bilaterally, largest of which measures only 5 mm in the medial segment of the right middle lobe (axial image 85 of series 4). No larger more suspicious appearing pulmonary nodules or masses are noted. No acute consolidative airspace disease. No pleural effusions. Musculoskeletal:  There are no aggressive  appearing lytic or blastic lesions noted in the visualized portions of the skeleton. CT ABDOMEN PELVIS FINDINGS Hepatobiliary: No suspicious cystic or solid hepatic lesions. No intra or extrahepatic biliary ductal dilatation. Gallbladder is normal in appearance. Pancreas: No pancreatic mass. No pancreatic ductal dilatation. No pancreatic or peripancreatic fluid collections or inflammatory changes. Spleen: Unremarkable. Adrenals/Urinary Tract: Low-attenuation lesions in the kidneys bilaterally, compatible with simple cysts, measuring up to 1.5 cm in the lower pole collecting system of left kidney. Multiple other subcentimeter low-attenuation lesions, too small to characterize, but favored to represent tiny cysts. In addition, in the upper pole of the right kidney there is a poorly defined 1.5 cm intermediate attenuation lesion which is incompletely characterized (axial image 72 of series 2) which is new compared to the prior study from 2015, nonspecific. Mild right hydronephrosis. This terminates at the right ureteropelvic junction. No hydroureter. Urinary bladder is normal in appearance. Bilateral adrenal glands are normal in appearance. Stomach/Bowel: Normal appearance of the stomach. Multiple mildly dilated loops of small bowel measuring up to 3.7 cm in diameter with multiple air-fluid levels. Distal small bowel is relatively decompressed, as is the colon. Postoperative changes of partial colectomy and near the rectosigmoid junction. The appendix is not confidently identified and may be surgically absent. Regardless, there are no inflammatory changes noted adjacent to the cecum to suggest the presence of an acute appendicitis at this time. Vascular/Lymphatic: Aortic atherosclerosis, without evidence of aneurysm or dissection in the abdominal or pelvic vasculature. Retroaortic left renal vein (normal anatomical variant). No lymphadenopathy noted in the abdomen or pelvis. Reproductive: Status  post hysterectomy. Ovaries are not confidently identified may be surgically absent or atrophic Other: No significant volume of ascites.  No pneumoperitoneum. Musculoskeletal: Posterior rod and screw fixation device extending from T9 through L5. There are no aggressive appearing lytic or blastic lesions noted in the visualized portions of the skeleton. IMPRESSION: 1. Mild dilatation of multiple loops of mid small bowel with multiple air-fluid levels, with relative decompression of the distal small bowel and colon. Findings are concerning for early or partial small bowel obstruction. No discrete transition point confidently identified on today's examination. 2. No acute findings in the thorax. 3. 1.5 cm indeterminate lesion in the upper pole of the right kidney. Abdominal MRI with and without IV gadolinium is recommended in 3-6 months to re-evaluate this lesion and provide better characterization. 4. Aortic atherosclerosis, in addition to left main and left anterior descending coronary artery disease. Assessment for potential risk factor modification, dietary therapy or pharmacologic therapy may be warranted, if clinically indicated. 5. Additional incidental findings, as above. Electronically Signed   By: Vinnie Langton M.D.   On: 07/05/2019 19:55   Ct Abdomen Pelvis W Contrast  Result Date: 07/05/2019 CLINICAL DATA:  78 year old female with history of acute generalized abdominal pain with nausea, vomiting and diarrhea, with fatigue. EXAM: CT CHEST, ABDOMEN, AND PELVIS WITH CONTRAST TECHNIQUE: Multidetector CT imaging of the chest, abdomen and pelvis was performed following the standard protocol during bolus administration of intravenous contrast. CONTRAST:  120mL OMNIPAQUE IOHEXOL 300 MG/ML  SOLN COMPARISON:  CT the abdomen and pelvis 11/23/2013. FINDINGS: CT CHEST FINDINGS Cardiovascular: Heart size is normal. There is no significant pericardial fluid, thickening or pericardial calcification. There is aortic  atherosclerosis, as well as atherosclerosis of the great vessels of the mediastinum and the coronary arteries, including calcified atherosclerotic plaque in the left main and left anterior descending coronary arteries. Mediastinum/Nodes: No pathologically enlarged mediastinal or hilar lymph nodes. Please  note that accurate exclusion of hilar adenopathy is limited on noncontrast CT scans. Small hiatal hernia. No axillary lymphadenopathy. Lungs/Pleura: Several pulmonary nodules are noted in the lungs bilaterally, largest of which measures only 5 mm in the medial segment of the right middle lobe (axial image 85 of series 4). No larger more suspicious appearing pulmonary nodules or masses are noted. No acute consolidative airspace disease. No pleural effusions. Musculoskeletal: There are no aggressive appearing lytic or blastic lesions noted in the visualized portions of the skeleton. CT ABDOMEN PELVIS FINDINGS Hepatobiliary: No suspicious cystic or solid hepatic lesions. No intra or extrahepatic biliary ductal dilatation. Gallbladder is normal in appearance. Pancreas: No pancreatic mass. No pancreatic ductal dilatation. No pancreatic or peripancreatic fluid collections or inflammatory changes. Spleen: Unremarkable. Adrenals/Urinary Tract: Low-attenuation lesions in the kidneys bilaterally, compatible with simple cysts, measuring up to 1.5 cm in the lower pole collecting system of left kidney. Multiple other subcentimeter low-attenuation lesions, too small to characterize, but favored to represent tiny cysts. In addition, in the upper pole of the right kidney there is a poorly defined 1.5 cm intermediate attenuation lesion which is incompletely characterized (axial image 72 of series 2) which is new compared to the prior study from 2015, nonspecific. Mild right hydronephrosis. This terminates at the right ureteropelvic junction. No hydroureter. Urinary bladder is normal in appearance. Bilateral adrenal glands are normal  in appearance. Stomach/Bowel: Normal appearance of the stomach. Multiple mildly dilated loops of small bowel measuring up to 3.7 cm in diameter with multiple air-fluid levels. Distal small bowel is relatively decompressed, as is the colon. Postoperative changes of partial colectomy and near the rectosigmoid junction. The appendix is not confidently identified and may be surgically absent. Regardless, there are no inflammatory changes noted adjacent to the cecum to suggest the presence of an acute appendicitis at this time. Vascular/Lymphatic: Aortic atherosclerosis, without evidence of aneurysm or dissection in the abdominal or pelvic vasculature. Retroaortic left renal vein (normal anatomical variant). No lymphadenopathy noted in the abdomen or pelvis. Reproductive: Status post hysterectomy. Ovaries are not confidently identified may be surgically absent or atrophic Other: No significant volume of ascites.  No pneumoperitoneum. Musculoskeletal: Posterior rod and screw fixation device extending from T9 through L5. There are no aggressive appearing lytic or blastic lesions noted in the visualized portions of the skeleton. IMPRESSION: 1. Mild dilatation of multiple loops of mid small bowel with multiple air-fluid levels, with relative decompression of the distal small bowel and colon. Findings are concerning for early or partial small bowel obstruction. No discrete transition point confidently identified on today's examination. 2. No acute findings in the thorax. 3. 1.5 cm indeterminate lesion in the upper pole of the right kidney. Abdominal MRI with and without IV gadolinium is recommended in 3-6 months to re-evaluate this lesion and provide better characterization. 4. Aortic atherosclerosis, in addition to left main and left anterior descending coronary artery disease. Assessment for potential risk factor modification, dietary therapy or pharmacologic therapy may be warranted, if clinically indicated. 5. Additional  incidental findings, as above. Electronically Signed   By: Vinnie Langton M.D.   On: 07/05/2019 19:55   Dg Chest Portable 1 View  Result Date: 07/05/2019 CLINICAL DATA:  Body aches.  Fever. EXAM: PORTABLE CHEST 1 VIEW COMPARISON:  03/15/2008 chest radiograph. FINDINGS: Partially visualized bilateral posterior spinal fusion hardware extending inferiorly from the lower thoracic spine. Stable cardiomediastinal silhouette with normal heart size. No pneumothorax. No pleural effusion. Mild curvilinear left lung base opacity. Otherwise clear lungs.  IMPRESSION: Mild curvilinear left lung base opacity, favoring scarring versus atelectasis. Otherwise no active disease. Electronically Signed   By: Ilona Sorrel M.D.   On: 07/05/2019 16:50    Pending Labs Unresulted Labs (From admission, onward)    Start     Ordered   07/05/19 1612  Culture, blood (routine x 2)  BLOOD CULTURE X 2,   STAT     07/05/19 1612   07/05/19 1612  Urine culture  Once,   STAT     07/05/19 1612   07/05/19 1431  Urinalysis, Complete w Microscopic  ONCE - STAT,   STAT     07/05/19 1430          Vitals/Pain Today's Vitals   07/05/19 1800 07/05/19 2030 07/05/19 2100 07/05/19 2130  BP: (!) 189/90 (!) 161/82 (!) 160/95 (!) 175/85  Pulse: (!) 135 (!) 103  96  Resp: 16   14  Temp:      TempSrc:      SpO2: (!) 89% 94%  94%  Weight:      Height:      PainSc:    0-No pain    Isolation Precautions No active isolations  Medications Medications  sodium chloride flush (NS) 0.9 % injection 3 mL (3 mLs Intravenous Refused 07/05/19 1852)  cefTRIAXone (ROCEPHIN) 2 g in sodium chloride 0.9 % 100 mL IVPB (0 g Intravenous Stopped 07/05/19 1851)  azithromycin (ZITHROMAX) 500 mg in sodium chloride 0.9 % 250 mL IVPB (500 mg Intravenous New Bag/Given 07/05/19 1851)  sodium chloride 0.9 % bolus 1,000 mL (0 mLs Intravenous Stopped 07/05/19 1700)  ondansetron (ZOFRAN) injection 4 mg (4 mg Intravenous Given 07/05/19 1831)  iohexol (OMNIPAQUE)  300 MG/ML solution 100 mL (100 mLs Intravenous Contrast Given 07/05/19 1858)    Mobility walks with device Low fall risk   Focused Assessments Pulmonary Assessment Handoff:  Lung sounds:   O2 Device: Nasal Cannula O2 Flow Rate (L/min): 2 L/min      R Recommendations: See Admitting Provider Note  Report given to:   Additional Notes:

## 2019-07-05 NOTE — ED Provider Notes (Signed)
[facility] Emergency Department Provider Note  ____________________________________________  Time seen: Approximately 4:40 PM  I have reviewed the triage vital signs and the nursing notes.   HISTORY  Chief Complaint Shortness of Breath and Abdominal Pain    HPI Stephanie [name] is a 78 y.o. female with a history of diabetes hyperlipidemia hypertension who complains of generalized abdominal pain with nausea vomiting diarrhea fatigue and body aches and chills and subjective fever for the last 2 days.  Gradual onset, constant, no aggravating or alleviating factors.  Pain is nonradiating.    Denies any risk of COVID exposure, no sick contacts.  Denies chest pain but does feel short of breath with exertion.      Past Medical History:  Diagnosis Date  . Arthritis   . Asthma   . Cancer ([place]) 1996   colon  . Diabetes mellitus without complication ([place])   . GERD (gastroesophageal reflux disease)   . Glaucoma   . Hyperlipidemia   . Hypertension   . Lumbar disc disease   . Small bowel obstruction ([place])   . Vitamin B 12 deficiency      Patient Active Problem List   Diagnosis Date Noted  . DM type 2 with diabetic mixed hyperlipidemia ([place]) 04/30/2018  . B-complex deficiency 03/03/2014  . Benign essential hypertension 01/16/2014  . Bladder neoplasm of uncertain malignant potential 09/14/2013  . Chronic cystitis 05/04/2013     Past Surgical History:  Procedure Laterality Date  . ABDOMINAL HYSTERECTOMY    . BACK SURGERY    . COLON SURGERY    . COLONOSCOPY WITH PROPOFOL N/A 05/22/2017   Procedure: COLONOSCOPY WITH PROPOFOL;  Surgeon: Manya Silvas, MD;  Location: [facility] ENDOSCOPY;  Service: Endoscopy;  Laterality: N/A;  . FRACTURE SURGERY    . KNEE ARTHROSCOPY    . LUMBAR LAMINECTOMY       Prior to Admission medications   Medication Sig Start Date End Date Taking? Authorizing Provider  amLODipine (NORVASC) 10 MG tablet Take 10 mg by mouth daily.     [provider]  amoxicillin (AMOXIL) 500 MG capsule TAKE 4 CAPSULES BY MOUTH 1 TIME 1 HOUR BEFORE PROCEDURE 07/29/18 07/30/19  [provider]  Ascorbic Acid (VITAMIN C) 500 MG CAPS Take 1 capsule by mouth daily.    [provider]  brimonidine (ALPHAGAN) 0.2 % ophthalmic solution Place 1 drop into both eyes 3 (three) times daily.    [provider]  calcium carbonate (OS-CAL - DOSED IN MG OF ELEMENTAL CALCIUM) 1250 (500 Ca) MG tablet Take 1 tablet by mouth daily.    [provider]  CONTOUR TEST test strip TEST BS QD UTD 03/09/18   [provider]  estradiol (ESTRACE) 0.1 MG/GM vaginal cream Place 1 Applicatorful vaginally at bedtime.    [provider]  folic acid (FOLVITE) 1 MG tablet Take 1 mg by mouth daily.    [provider]  glimepiride (AMARYL) 1 MG tablet Take 1 mg by mouth 2 days. Take 0.5 tablet every other day    [provider]  hydrochlorothiazide (HYDRODIURIL) 25 MG tablet Take 25 mg by mouth daily.    [provider]  leflunomide (ARAVA) 10 MG tablet Take 10 mg by mouth daily.    [provider]  magnesium oxide (MAG-OX) 400 MG tablet Take 400 mg by mouth daily.    [provider]  metFORMIN (GLUCOPHAGE-XR) 500 MG 24 hr tablet Take 500 mg by mouth 2 (two) times  daily.    [provider]  metoprolol tartrate (LOPRESSOR) 50 MG tablet Take 50 mg by mouth 2 (two) times daily.    [provider]  omeprazole (PRILOSEC) 40 MG capsule Take 40 mg by mouth daily.    [provider]  potassium chloride SA (K-DUR,KLOR-CON) 20 MEQ tablet Take 20 mEq by mouth daily.    [provider]  simvastatin (ZOCOR) 20 MG tablet Take 20 mg by mouth daily.    [provider]  tacrolimus (PROTOPIC) 0.1 % ointment Apply topically 2 (two) times daily as needed.    [provider]  tamsulosin (FLOMAX) 0.4 MG CAPS capsule Take one capsule twice daily.  03/31/19   Zara Council A, PA-C  vitamin B-12 (CYANOCOBALAMIN) 1000 MCG tablet Take 1,000 mcg by mouth daily.    [provider]     Allergies Ibuprofen, Indomethacin, Infliximab, Methotrexate derivatives, Moexipril, Percocet [oxycodone-acetaminophen], and Naprosyn [naproxen]   Family History  Problem Relation Age of Onset  . Breast cancer Neg Hx     Social History Social History   Tobacco Use  . Smoking status: Never Smoker  . Smokeless tobacco: Never Used  Substance Use Topics  . Alcohol use: No  . Drug use: No    Review of Systems  Constitutional:   Positive fever and chills.  ENT:   No sore throat. No rhinorrhea. Cardiovascular:   No chest pain or syncope. Respiratory:   Positive shortness of breath and nonproductive cough. Gastrointestinal:   Positive as above for abdominal pain, vomiting and diarrhea.  Musculoskeletal:   Negative for focal pain or swelling All other systems reviewed and are negative except as documented above in ROS and HPI.  ____________________________________________   PHYSICAL EXAM:  VITAL SIGNS: ED Triage Vitals  Enc Vitals Group     BP 07/05/19 1427 (!) 163/97     Pulse Rate 07/05/19 1427 (!) 110     Resp 07/05/19 1427 18     Temp 07/05/19 1427 99.8 F (37.7 C)     Temp Source 07/05/19 1427 Oral     SpO2 07/05/19 1427 97 %     Weight 07/05/19 1428 170 lb (77.1 kg)     Height 07/05/19 1428 5' 0.6" (1.539 m)     Head Circumference --      Peak Flow --      Pain Score 07/05/19 1428 0     Pain Loc --      Pain Edu? --      Excl. in [place]? --     Vital signs reviewed, nursing assessments reviewed.   Constitutional:   Alert and oriented. Non-toxic appearance. Eyes:   Conjunctivae are normal. EOMI. PERRL. ENT      Head:   Normocephalic and atraumatic.      Nose:   Wearing a mask.      Mouth/Throat:   Wearing a mask.      Neck:   No meningismus. Full ROM. Hematological/Lymphatic/Immunilogical:   No cervical  lymphadenopathy. Cardiovascular:   Tachycardia heart rate 110. Symmetric bilateral radial and DP pulses.  No murmurs. Cap refill less than 2 seconds. Respiratory:   Normal respiratory effort without tachypnea/retractions. Breath sounds are clear and equal bilaterally. No wheezes/rales/rhonchi. Gastrointestinal:   Soft with mild diffuse tenderness worse in the right lower quadrant.  There is no CVA tenderness.  No rebound, rigidity, or guarding.  Musculoskeletal:   Normal range of motion in all extremities. No joint effusions.  No lower extremity tenderness.  No edema. Neurologic:   Normal speech and language.  Motor grossly intact. No acute focal neurologic deficits are appreciated.  Skin:    Skin is warm, dry and intact. No rash noted.  No petechiae, purpura, or bullae.  ____________________________________________    LABS (pertinent positives/negatives) (all labs ordered are listed, but only abnormal results are displayed) Labs Reviewed  COMPREHENSIVE METABOLIC PANEL - Abnormal; Notable for the following components:      Result Value   Glucose, Bld 299 (*)    GFR calc non Af Amer 60 (*)    All other components within normal limits  CBC - Abnormal; Notable for the following components:   WBC 15.0 (*)    All other components within normal limits  LACTIC ACID, PLASMA - Abnormal; Notable for the following components:   Lactic Acid, Venous 2.3 (*)    All other components within normal limits  CBC WITH DIFFERENTIAL/PLATELET - Abnormal; Notable for the following components:   WBC 15.1 (*)    Neutro Abs 13.1 (*)    Monocytes Absolute 1.1 (*)    All other components within normal limits  SARS CORONAVIRUS 2 (HOSPITAL ORDER, [facility])  CULTURE, BLOOD (ROUTINE X 2)  CULTURE, BLOOD (ROUTINE X 2)  URINE CULTURE  LIPASE, BLOOD  URINALYSIS, COMPLETE (UACMP) WITH MICROSCOPIC   ____________________________________________   EKG  Interpreted by me Sinus  tachycardia rate 108, left axis, normal intervals.  Right bundle branch block.  No acute ischemic changes.  ____________________________________________    RADIOLOGY  Ct Chest W Contrast  Result Date: 07/05/2019 CLINICAL DATA:  78 year old female with history of acute generalized abdominal pain with nausea, vomiting and diarrhea, with fatigue. EXAM: CT CHEST, ABDOMEN, AND PELVIS WITH CONTRAST TECHNIQUE: Multidetector CT imaging of the chest, abdomen and pelvis was performed following the standard protocol during bolus administration of intravenous contrast. CONTRAST:  173mL OMNIPAQUE IOHEXOL 300 MG/ML  SOLN COMPARISON:  CT the abdomen and pelvis 11/23/2013. FINDINGS: CT CHEST FINDINGS Cardiovascular: Heart size is normal. There is no significant pericardial fluid, thickening or pericardial calcification. There is aortic atherosclerosis, as well as atherosclerosis of the great vessels of the mediastinum and the coronary arteries, including calcified atherosclerotic plaque in the left main and left anterior descending coronary arteries. Mediastinum/Nodes: No pathologically enlarged mediastinal or hilar lymph nodes. Please note that accurate exclusion of hilar adenopathy is limited on noncontrast CT scans. Small hiatal hernia. No axillary lymphadenopathy. Lungs/Pleura: Several pulmonary nodules are noted in the lungs bilaterally, largest of which measures only 5 mm in the medial segment of the right middle lobe (axial image 85 of series 4). No larger more suspicious appearing pulmonary nodules or masses are noted. No acute consolidative airspace disease. No pleural effusions. Musculoskeletal: There are no aggressive appearing lytic or blastic lesions noted in the visualized portions of the skeleton. CT ABDOMEN PELVIS FINDINGS Hepatobiliary: No suspicious cystic or solid hepatic lesions. No intra or extrahepatic biliary ductal dilatation. Gallbladder is normal in appearance. Pancreas: No pancreatic mass. No  pancreatic ductal dilatation. No pancreatic or peripancreatic fluid collections or inflammatory changes. Spleen: Unremarkable. Adrenals/Urinary Tract: Low-attenuation lesions in the kidneys bilaterally, compatible with simple cysts, measuring up to 1.5 cm in the lower pole collecting system of left kidney. Multiple other subcentimeter low-attenuation lesions, too small to characterize, but favored to represent tiny cysts. In addition, in the upper pole of the right kidney there is a poorly defined 1.5 cm intermediate attenuation lesion which is incompletely characterized (axial image  72 of series 2) which is new compared to the prior study from 2015, nonspecific. Mild right hydronephrosis. This terminates at the right ureteropelvic junction. No hydroureter. Urinary bladder is normal in appearance. Bilateral adrenal glands are normal in appearance. Stomach/Bowel: Normal appearance of the stomach. Multiple mildly dilated loops of small bowel measuring up to 3.7 cm in diameter with multiple air-fluid levels. Distal small bowel is relatively decompressed, as is the colon. Postoperative changes of partial colectomy and near the rectosigmoid junction. The appendix is not confidently identified and may be surgically absent. Regardless, there are no inflammatory changes noted adjacent to the cecum to suggest the presence of an acute appendicitis at this time. Vascular/Lymphatic: Aortic atherosclerosis, without evidence of aneurysm or dissection in the abdominal or pelvic vasculature. Retroaortic left renal vein (normal anatomical variant). No lymphadenopathy noted in the abdomen or pelvis. Reproductive: Status post hysterectomy. Ovaries are not confidently identified may be surgically absent or atrophic Other: No significant volume of ascites.  No pneumoperitoneum. Musculoskeletal: Posterior rod and screw fixation device extending from T9 through L5. There are no aggressive appearing lytic or blastic lesions noted in the  visualized portions of the skeleton. IMPRESSION: 1. Mild dilatation of multiple loops of mid small bowel with multiple air-fluid levels, with relative decompression of the distal small bowel and colon. Findings are concerning for early or partial small bowel obstruction. No discrete transition point confidently identified on today's examination. 2. No acute findings in the thorax. 3. 1.5 cm indeterminate lesion in the upper pole of the right kidney. Abdominal MRI with and without IV gadolinium is recommended in 3-6 months to re-evaluate this lesion and provide better characterization. 4. Aortic atherosclerosis, in addition to left main and left anterior descending coronary artery disease. Assessment for potential risk factor modification, dietary therapy or pharmacologic therapy may be warranted, if clinically indicated. 5. Additional incidental findings, as above. Electronically Signed   By: Vinnie Langton M.D.   On: 07/05/2019 19:55   Ct Abdomen Pelvis W Contrast  Result Date: 07/05/2019 CLINICAL DATA:  78 year old female with history of acute generalized abdominal pain with nausea, vomiting and diarrhea, with fatigue. EXAM: CT CHEST, ABDOMEN, AND PELVIS WITH CONTRAST TECHNIQUE: Multidetector CT imaging of the chest, abdomen and pelvis was performed following the standard protocol during bolus administration of intravenous contrast. CONTRAST:  138mL OMNIPAQUE IOHEXOL 300 MG/ML  SOLN COMPARISON:  CT the abdomen and pelvis 11/23/2013. FINDINGS: CT CHEST FINDINGS Cardiovascular: Heart size is normal. There is no significant pericardial fluid, thickening or pericardial calcification. There is aortic atherosclerosis, as well as atherosclerosis of the great vessels of the mediastinum and the coronary arteries, including calcified atherosclerotic plaque in the left main and left anterior descending coronary arteries. Mediastinum/Nodes: No pathologically enlarged mediastinal or hilar lymph nodes. Please note that  accurate exclusion of hilar adenopathy is limited on noncontrast CT scans. Small hiatal hernia. No axillary lymphadenopathy. Lungs/Pleura: Several pulmonary nodules are noted in the lungs bilaterally, largest of which measures only 5 mm in the medial segment of the right middle lobe (axial image 85 of series 4). No larger more suspicious appearing pulmonary nodules or masses are noted. No acute consolidative airspace disease. No pleural effusions. Musculoskeletal: There are no aggressive appearing lytic or blastic lesions noted in the visualized portions of the skeleton. CT ABDOMEN PELVIS FINDINGS Hepatobiliary: No suspicious cystic or solid hepatic lesions. No intra or extrahepatic biliary ductal dilatation. Gallbladder is normal in appearance. Pancreas: No pancreatic mass. No pancreatic ductal dilatation. No pancreatic or  peripancreatic fluid collections or inflammatory changes. Spleen: Unremarkable. Adrenals/Urinary Tract: Low-attenuation lesions in the kidneys bilaterally, compatible with simple cysts, measuring up to 1.5 cm in the lower pole collecting system of left kidney. Multiple other subcentimeter low-attenuation lesions, too small to characterize, but favored to represent tiny cysts. In addition, in the upper pole of the right kidney there is a poorly defined 1.5 cm intermediate attenuation lesion which is incompletely characterized (axial image 72 of series 2) which is new compared to the prior study from 2015, nonspecific. Mild right hydronephrosis. This terminates at the right ureteropelvic junction. No hydroureter. Urinary bladder is normal in appearance. Bilateral adrenal glands are normal in appearance. Stomach/Bowel: Normal appearance of the stomach. Multiple mildly dilated loops of small bowel measuring up to 3.7 cm in diameter with multiple air-fluid levels. Distal small bowel is relatively decompressed, as is the colon. Postoperative changes of partial colectomy and near the rectosigmoid  junction. The appendix is not confidently identified and may be surgically absent. Regardless, there are no inflammatory changes noted adjacent to the cecum to suggest the presence of an acute appendicitis at this time. Vascular/Lymphatic: Aortic atherosclerosis, without evidence of aneurysm or dissection in the abdominal or pelvic vasculature. Retroaortic left renal vein (normal anatomical variant). No lymphadenopathy noted in the abdomen or pelvis. Reproductive: Status post hysterectomy. Ovaries are not confidently identified may be surgically absent or atrophic Other: No significant volume of ascites.  No pneumoperitoneum. Musculoskeletal: Posterior rod and screw fixation device extending from T9 through L5. There are no aggressive appearing lytic or blastic lesions noted in the visualized portions of the skeleton. IMPRESSION: 1. Mild dilatation of multiple loops of mid small bowel with multiple air-fluid levels, with relative decompression of the distal small bowel and colon. Findings are concerning for early or partial small bowel obstruction. No discrete transition point confidently identified on today's examination. 2. No acute findings in the thorax. 3. 1.5 cm indeterminate lesion in the upper pole of the right kidney. Abdominal MRI with and without IV gadolinium is recommended in 3-6 months to re-evaluate this lesion and provide better characterization. 4. Aortic atherosclerosis, in addition to left main and left anterior descending coronary artery disease. Assessment for potential risk factor modification, dietary therapy or pharmacologic therapy may be warranted, if clinically indicated. 5. Additional incidental findings, as above. Electronically Signed   By: Vinnie Langton M.D.   On: 07/05/2019 19:55   Dg Chest Portable 1 View  Result Date: 07/05/2019 CLINICAL DATA:  Body aches.  Fever. EXAM: PORTABLE CHEST 1 VIEW COMPARISON:  03/15/2008 chest radiograph. FINDINGS: Partially visualized bilateral  posterior spinal fusion hardware extending inferiorly from the lower thoracic spine. Stable cardiomediastinal silhouette with normal heart size. No pneumothorax. No pleural effusion. Mild curvilinear left lung base opacity. Otherwise clear lungs. IMPRESSION: Mild curvilinear left lung base opacity, favoring scarring versus atelectasis. Otherwise no active disease. Electronically Signed   By: Ilona Sorrel M.D.   On: 07/05/2019 16:50    ____________________________________________   PROCEDURES Procedures  ____________________________________________  DIFFERENTIAL DIAGNOSIS   Diverticulitis, intra-abdominal abscess, viral syndrome, COVID-19, dehydration, electrolyte abnormality, biliary disease  CLINICAL IMPRESSION / ASSESSMENT AND PLAN / ED COURSE  Medications ordered in the ED: Medications  sodium chloride flush (NS) 0.9 % injection 3 mL (3 mLs Intravenous Refused 07/05/19 1852)  cefTRIAXone (ROCEPHIN) 2 g in sodium chloride 0.9 % 100 mL IVPB (0 g Intravenous Stopped 07/05/19 1851)  azithromycin (ZITHROMAX) 500 mg in sodium chloride 0.9 % 250 mL IVPB (500 mg Intravenous  New Bag/Given 07/05/19 1851)  sodium chloride 0.9 % bolus 1,000 mL (0 mLs Intravenous Stopped 07/05/19 1700)  ondansetron (ZOFRAN) injection 4 mg (4 mg Intravenous Given 07/05/19 1831)  iohexol (OMNIPAQUE) 300 MG/ML solution 100 mL (100 mLs Intravenous Contrast Given 07/05/19 1858)    Pertinent labs & imaging results that were available during my care of the patient were reviewed by me and considered in my medical decision making (see chart for details).  Stephanie [name] was evaluated in Emergency Department on 07/05/2019 for the symptoms described in the history of present illness. She was evaluated in the context of the global COVID-19 pandemic, which necessitated consideration that the patient might be at risk for infection with the SARS-CoV-2 virus that causes COVID-19. Institutional protocols and algorithms that pertain to the  evaluation of patients at risk for COVID-19 are in a state of rapid change based on information released by regulatory bodies including the CDC and federal and state organizations. These policies and algorithms were followed during the patient's care in the ED.     Clinical Course as of Jul 04 2136  Nancy Fetter Jul 05, 2019  1614 Patient presents with tachycardia, low-grade fever.  She describes a constellation of symptoms consistent with a viral illness, high suspicion for COVID-19.  Patient's not septic on arrival but I will check blood cultures and lactic acid given her age and vital signs.  IV fluids for hydration.  Chest x-ray, urinalysis, CT abdomen pelvis.   [PS]  1806 Covid negative. Lactate 2.3. so2 decreased to 89%, requiring 2L . Will start ceftriaxone and azithromycin for CAP .   [PS]    Clinical Course User Index [PS] Carrie Mew, MD    ----------------------------------------- 9:38 PM on 07/05/2019 -----------------------------------------  CT chest abdomen pelvis obtained due to hypoxia, nondiagnostic chest x-ray, so far unrevealing work-up.  This reveals a partial small bowel obstruction.  Plan to admit for acute hypoxic respiratory failure, partial SBO, further observation with initiation of antibiotics.  ____________________________________________   FINAL CLINICAL IMPRESSION(S) / ED DIAGNOSES    Final diagnoses:  Acute respiratory failure with hypoxia (HCC)  Partial small bowel obstruction (HCC)  Type 2 diabetes mellitus with other specified complication, unspecified whether long term insulin use [facility])     ED Discharge Orders    None      Portions of this note were generated with dragon dictation software. Dictation errors may occur despite best attempts at proofreading.   Carrie Mew, MD 07/05/19 2138

## 2019-07-05 NOTE — H&P (Signed)
[facility] [place] NAME: Stephanie [name]    MR#:  [id number]  DATE OF BIRTH:  1940-12-18  DATE OF ADMISSION:  07/05/2019  PRIMARY CARE PHYSICIAN: Rusty Aus, MD   REQUESTING/REFERRING PHYSICIAN: Joni Fears, MD  CHIEF COMPLAINT:   Chief Complaint  Patient presents with  . Shortness of Breath  . Abdominal Pain    HISTORY OF PRESENT ILLNESS:  Stephanie [name]  is a 78 y.o. female who presents with chief complaint as above.  Patient presents the ED with a complaint of few days cough and 2 days of nausea vomiting and diarrhea.  On evaluation here she is found to meet sepsis criteria.  She has small pneumonia on imaging, and questionable partial small bowel obstruction.  However, she was having diarrhea up until yesterday making the bowel obstruction diagnosis uncertain.  She was given antibiotics in the ED and hospitalist were called for admission  PAST MEDICAL HISTORY:   Past Medical History:  Diagnosis Date  . Arthritis   . Asthma   . Cancer ([place]) 1996   colon  . Diabetes mellitus without complication ([place])   . GERD (gastroesophageal reflux disease)   . Glaucoma   . Hyperlipidemia   . Hypertension   . Lumbar disc disease   . Small bowel obstruction ([place])   . Vitamin B 12 deficiency      PAST SURGICAL HISTORY:   Past Surgical History:  Procedure Laterality Date  . ABDOMINAL HYSTERECTOMY    . BACK SURGERY    . COLON SURGERY    . COLONOSCOPY WITH PROPOFOL N/A 05/22/2017   Procedure: COLONOSCOPY WITH PROPOFOL;  Surgeon: Manya Silvas, MD;  Location: [facility] ENDOSCOPY;  Service: Endoscopy;  Laterality: N/A;  . FRACTURE SURGERY    . KNEE ARTHROSCOPY    . LUMBAR LAMINECTOMY       SOCIAL HISTORY:   Social History   Tobacco Use  . Smoking status: Never Smoker  . Smokeless tobacco: Never Used  Substance Use Topics  . Alcohol use: No     FAMILY HISTORY:   Family History  Problem Relation Age of Onset  . Breast  cancer Neg Hx      DRUG ALLERGIES:   Allergies  Allergen Reactions  . Ibuprofen Itching  . Indomethacin   . Infliximab   . Methotrexate Derivatives   . Moexipril   . Percocet [Oxycodone-Acetaminophen] Itching  . Naprosyn [Naproxen] Rash    MEDICATIONS AT HOME:   Prior to Admission medications   Medication Sig Start Date End Date Taking? Authorizing Provider  amLODipine (NORVASC) 10 MG tablet Take 10 mg by mouth daily.    [provider]  amoxicillin (AMOXIL) 500 MG capsule TAKE 4 CAPSULES BY MOUTH 1 TIME 1 HOUR BEFORE PROCEDURE 07/29/18 07/30/19  [provider]  Ascorbic Acid (VITAMIN C) 500 MG CAPS Take 1 capsule by mouth daily.    [provider]  brimonidine (ALPHAGAN) 0.2 % ophthalmic solution Place 1 drop into both eyes 3 (three) times daily.    [provider]  calcium carbonate (OS-CAL - DOSED IN MG OF ELEMENTAL CALCIUM) 1250 (500 Ca) MG tablet Take 1 tablet by mouth daily.    [provider]  CONTOUR TEST test strip TEST BS QD UTD 03/09/18   [provider]  estradiol (ESTRACE) 0.1 MG/GM vaginal cream Place 1 Applicatorful vaginally at bedtime.    [provider]  folic acid (FOLVITE) 1 MG tablet Take 1 mg  by mouth daily.    [provider]  glimepiride (AMARYL) 1 MG tablet Take 1 mg by mouth 2 days. Take 0.5 tablet every other day    [provider]  hydrochlorothiazide (HYDRODIURIL) 25 MG tablet Take 25 mg by mouth daily.    [provider]  leflunomide (ARAVA) 10 MG tablet Take 10 mg by mouth daily.    [provider]  magnesium oxide (MAG-OX) 400 MG tablet Take 400 mg by mouth daily.    [provider]  metFORMIN (GLUCOPHAGE-XR) 500 MG 24 hr tablet Take 500 mg by mouth 2 (two) times daily.    [provider]  metoprolol tartrate (LOPRESSOR) 50 MG tablet Take 50 mg by mouth 2 (two) times daily.    [provider]  omeprazole (PRILOSEC) 40 MG  capsule Take 40 mg by mouth daily.    [provider]  potassium chloride SA (K-DUR,KLOR-CON) 20 MEQ tablet Take 20 mEq by mouth daily.    [provider]  simvastatin (ZOCOR) 20 MG tablet Take 20 mg by mouth daily.    [provider]  tacrolimus (PROTOPIC) 0.1 % ointment Apply topically 2 (two) times daily as needed.    [provider]  tamsulosin (FLOMAX) 0.4 MG CAPS capsule Take one capsule twice daily. 03/31/19   Zara Council A, PA-C  vitamin B-12 (CYANOCOBALAMIN) 1000 MCG tablet Take 1,000 mcg by mouth daily.    [provider]    REVIEW OF SYSTEMS:  Review of Systems  Constitutional: Positive for fever and malaise/fatigue. Negative for chills and weight loss.  HENT: Negative for ear pain, hearing loss and tinnitus.   Eyes: Negative for blurred vision, double vision, pain and redness.  Respiratory: Positive for cough. Negative for hemoptysis and shortness of breath.   Cardiovascular: Negative for chest pain, palpitations, orthopnea and leg swelling.  Gastrointestinal: Positive for diarrhea, nausea and vomiting. Negative for abdominal pain and constipation.  Genitourinary: Negative for dysuria, frequency and hematuria.  Musculoskeletal: Negative for back pain, joint pain and neck pain.  Skin:       No acne, rash, or lesions  Neurological: Negative for dizziness, tremors, focal weakness and weakness.  Endo/Heme/Allergies: Negative for polydipsia. Does not bruise/bleed easily.  Psychiatric/Behavioral: Negative for depression. The patient is not nervous/anxious and does not have insomnia.      VITAL SIGNS:   Vitals:   07/05/19 1800 07/05/19 2030 07/05/19 2100 07/05/19 2130  BP: (!) 189/90 (!) 161/82 (!) 160/95 (!) 175/85  Pulse: (!) 135 (!) 103  96  Resp: 16   14  Temp:      TempSrc:      SpO2: (!) 89% 94%  94%  Weight:      Height:       Wt Readings from Last 3 Encounters:  07/05/19 77.1 kg  03/06/19 76.7 kg  10/13/18 81.2 kg     PHYSICAL EXAMINATION:  Physical Exam  Vitals reviewed. Constitutional: She is oriented to person, place, and time. She appears well-developed and well-nourished. No distress.  HENT:  Head: Normocephalic and atraumatic.  Mouth/Throat: Oropharynx is clear and moist.  Eyes: Pupils are equal, round, and reactive to light. Conjunctivae and EOM are normal. No scleral icterus.  Neck: Normal range of motion. Neck supple. No JVD present. No thyromegaly present.  Cardiovascular: Regular rhythm and intact distal pulses. Exam reveals no gallop and no friction rub.  No murmur heard. Tachycardic  Respiratory: Effort normal. No respiratory distress. She has no wheezes. She  has no rales.  Basilar coarse breath sounds  GI: Soft. Bowel sounds are normal. She exhibits no distension. There is abdominal tenderness.  Musculoskeletal: Normal range of motion.        General: No edema.     Comments: No arthritis, no gout  Lymphadenopathy:    She has no cervical adenopathy.  Neurological: She is alert and oriented to person, place, and time. No cranial nerve deficit.  No dysarthria, no aphasia  Skin: Skin is warm and dry. No rash noted. No erythema.  Psychiatric: She has a normal mood and affect. Her behavior is normal. Judgment and thought content normal.    LABORATORY PANEL:   CBC Recent Labs  Lab 07/05/19 1446  WBC 15.1*  15.0*  HGB 13.0  13.0  HCT 40.0  39.8  PLT 223  220   ------------------------------------------------------------------------------------------------------------------  Chemistries  Recent Labs  Lab 07/05/19 1446  NA 138  K 3.5  CL 99  CO2 24  GLUCOSE 299*  BUN 10  CREATININE 0.93  CALCIUM 9.3  AST 23  ALT 19  ALKPHOS 103  BILITOT 1.2   ------------------------------------------------------------------------------------------------------------------  Cardiac Enzymes No results for input(s): TROPONINI in the last 168  hours. ------------------------------------------------------------------------------------------------------------------  RADIOLOGY:  Ct Chest W Contrast  Result Date: 07/05/2019 CLINICAL DATA:  78 year old female with history of acute generalized abdominal pain with nausea, vomiting and diarrhea, with fatigue. EXAM: CT CHEST, ABDOMEN, AND PELVIS WITH CONTRAST TECHNIQUE: Multidetector CT imaging of the chest, abdomen and pelvis was performed following the standard protocol during bolus administration of intravenous contrast. CONTRAST:  156mL OMNIPAQUE IOHEXOL 300 MG/ML  SOLN COMPARISON:  CT the abdomen and pelvis 11/23/2013. FINDINGS: CT CHEST FINDINGS Cardiovascular: Heart size is normal. There is no significant pericardial fluid, thickening or pericardial calcification. There is aortic atherosclerosis, as well as atherosclerosis of the great vessels of the mediastinum and the coronary arteries, including calcified atherosclerotic plaque in the left main and left anterior descending coronary arteries. Mediastinum/Nodes: No pathologically enlarged mediastinal or hilar lymph nodes. Please note that accurate exclusion of hilar adenopathy is limited on noncontrast CT scans. Small hiatal hernia. No axillary lymphadenopathy. Lungs/Pleura: Several pulmonary nodules are noted in the lungs bilaterally, largest of which measures only 5 mm in the medial segment of the right middle lobe (axial image 85 of series 4). No larger more suspicious appearing pulmonary nodules or masses are noted. No acute consolidative airspace disease. No pleural effusions. Musculoskeletal: There are no aggressive appearing lytic or blastic lesions noted in the visualized portions of the skeleton. CT ABDOMEN PELVIS FINDINGS Hepatobiliary: No suspicious cystic or solid hepatic lesions. No intra or extrahepatic biliary ductal dilatation. Gallbladder is normal in appearance. Pancreas: No pancreatic mass. No pancreatic ductal dilatation. No  pancreatic or peripancreatic fluid collections or inflammatory changes. Spleen: Unremarkable. Adrenals/Urinary Tract: Low-attenuation lesions in the kidneys bilaterally, compatible with simple cysts, measuring up to 1.5 cm in the lower pole collecting system of left kidney. Multiple other subcentimeter low-attenuation lesions, too small to characterize, but favored to represent tiny cysts. In addition, in the upper pole of the right kidney there is a poorly defined 1.5 cm intermediate attenuation lesion which is incompletely characterized (axial image 72 of series 2) which is new compared to the prior study from 2015, nonspecific. Mild right hydronephrosis. This terminates at the right ureteropelvic junction. No hydroureter. Urinary bladder is normal in appearance. Bilateral adrenal glands are normal in appearance. Stomach/Bowel: Normal appearance of the stomach. Multiple mildly dilated loops of small bowel  measuring up to 3.7 cm in diameter with multiple air-fluid levels. Distal small bowel is relatively decompressed, as is the colon. Postoperative changes of partial colectomy and near the rectosigmoid junction. The appendix is not confidently identified and may be surgically absent. Regardless, there are no inflammatory changes noted adjacent to the cecum to suggest the presence of an acute appendicitis at this time. Vascular/Lymphatic: Aortic atherosclerosis, without evidence of aneurysm or dissection in the abdominal or pelvic vasculature. Retroaortic left renal vein (normal anatomical variant). No lymphadenopathy noted in the abdomen or pelvis. Reproductive: Status post hysterectomy. Ovaries are not confidently identified may be surgically absent or atrophic Other: No significant volume of ascites.  No pneumoperitoneum. Musculoskeletal: Posterior rod and screw fixation device extending from T9 through L5. There are no aggressive appearing lytic or blastic lesions noted in the visualized portions of the  skeleton. IMPRESSION: 1. Mild dilatation of multiple loops of mid small bowel with multiple air-fluid levels, with relative decompression of the distal small bowel and colon. Findings are concerning for early or partial small bowel obstruction. No discrete transition point confidently identified on today's examination. 2. No acute findings in the thorax. 3. 1.5 cm indeterminate lesion in the upper pole of the right kidney. Abdominal MRI with and without IV gadolinium is recommended in 3-6 months to re-evaluate this lesion and provide better characterization. 4. Aortic atherosclerosis, in addition to left main and left anterior descending coronary artery disease. Assessment for potential risk factor modification, dietary therapy or pharmacologic therapy may be warranted, if clinically indicated. 5. Additional incidental findings, as above. Electronically Signed   By: Vinnie Langton M.D.   On: 07/05/2019 19:55   Ct Abdomen Pelvis W Contrast  Result Date: 07/05/2019 CLINICAL DATA:  78 year old female with history of acute generalized abdominal pain with nausea, vomiting and diarrhea, with fatigue. EXAM: CT CHEST, ABDOMEN, AND PELVIS WITH CONTRAST TECHNIQUE: Multidetector CT imaging of the chest, abdomen and pelvis was performed following the standard protocol during bolus administration of intravenous contrast. CONTRAST:  172mL OMNIPAQUE IOHEXOL 300 MG/ML  SOLN COMPARISON:  CT the abdomen and pelvis 11/23/2013. FINDINGS: CT CHEST FINDINGS Cardiovascular: Heart size is normal. There is no significant pericardial fluid, thickening or pericardial calcification. There is aortic atherosclerosis, as well as atherosclerosis of the great vessels of the mediastinum and the coronary arteries, including calcified atherosclerotic plaque in the left main and left anterior descending coronary arteries. Mediastinum/Nodes: No pathologically enlarged mediastinal or hilar lymph nodes. Please note that accurate exclusion of hilar  adenopathy is limited on noncontrast CT scans. Small hiatal hernia. No axillary lymphadenopathy. Lungs/Pleura: Several pulmonary nodules are noted in the lungs bilaterally, largest of which measures only 5 mm in the medial segment of the right middle lobe (axial image 85 of series 4). No larger more suspicious appearing pulmonary nodules or masses are noted. No acute consolidative airspace disease. No pleural effusions. Musculoskeletal: There are no aggressive appearing lytic or blastic lesions noted in the visualized portions of the skeleton. CT ABDOMEN PELVIS FINDINGS Hepatobiliary: No suspicious cystic or solid hepatic lesions. No intra or extrahepatic biliary ductal dilatation. Gallbladder is normal in appearance. Pancreas: No pancreatic mass. No pancreatic ductal dilatation. No pancreatic or peripancreatic fluid collections or inflammatory changes. Spleen: Unremarkable. Adrenals/Urinary Tract: Low-attenuation lesions in the kidneys bilaterally, compatible with simple cysts, measuring up to 1.5 cm in the lower pole collecting system of left kidney. Multiple other subcentimeter low-attenuation lesions, too small to characterize, but favored to represent tiny cysts. In addition, in the  upper pole of the right kidney there is a poorly defined 1.5 cm intermediate attenuation lesion which is incompletely characterized (axial image 72 of series 2) which is new compared to the prior study from 2015, nonspecific. Mild right hydronephrosis. This terminates at the right ureteropelvic junction. No hydroureter. Urinary bladder is normal in appearance. Bilateral adrenal glands are normal in appearance. Stomach/Bowel: Normal appearance of the stomach. Multiple mildly dilated loops of small bowel measuring up to 3.7 cm in diameter with multiple air-fluid levels. Distal small bowel is relatively decompressed, as is the colon. Postoperative changes of partial colectomy and near the rectosigmoid junction. The appendix is not  confidently identified and may be surgically absent. Regardless, there are no inflammatory changes noted adjacent to the cecum to suggest the presence of an acute appendicitis at this time. Vascular/Lymphatic: Aortic atherosclerosis, without evidence of aneurysm or dissection in the abdominal or pelvic vasculature. Retroaortic left renal vein (normal anatomical variant). No lymphadenopathy noted in the abdomen or pelvis. Reproductive: Status post hysterectomy. Ovaries are not confidently identified may be surgically absent or atrophic Other: No significant volume of ascites.  No pneumoperitoneum. Musculoskeletal: Posterior rod and screw fixation device extending from T9 through L5. There are no aggressive appearing lytic or blastic lesions noted in the visualized portions of the skeleton. IMPRESSION: 1. Mild dilatation of multiple loops of mid small bowel with multiple air-fluid levels, with relative decompression of the distal small bowel and colon. Findings are concerning for early or partial small bowel obstruction. No discrete transition point confidently identified on today's examination. 2. No acute findings in the thorax. 3. 1.5 cm indeterminate lesion in the upper pole of the right kidney. Abdominal MRI with and without IV gadolinium is recommended in 3-6 months to re-evaluate this lesion and provide better characterization. 4. Aortic atherosclerosis, in addition to left main and left anterior descending coronary artery disease. Assessment for potential risk factor modification, dietary therapy or pharmacologic therapy may be warranted, if clinically indicated. 5. Additional incidental findings, as above. Electronically Signed   By: Vinnie Langton M.D.   On: 07/05/2019 19:55   Dg Chest Portable 1 View  Result Date: 07/05/2019 CLINICAL DATA:  Body aches.  Fever. EXAM: PORTABLE CHEST 1 VIEW COMPARISON:  03/15/2008 chest radiograph. FINDINGS: Partially visualized bilateral posterior spinal fusion hardware  extending inferiorly from the lower thoracic spine. Stable cardiomediastinal silhouette with normal heart size. No pneumothorax. No pleural effusion. Mild curvilinear left lung base opacity. Otherwise clear lungs. IMPRESSION: Mild curvilinear left lung base opacity, favoring scarring versus atelectasis. Otherwise no active disease. Electronically Signed   By: Ilona Sorrel M.D.   On: 07/05/2019 16:50    EKG:   Orders placed or performed during the hospital encounter of 07/05/19  . ED EKG  . ED EKG    IMPRESSION AND PLAN:  Principal Problem:   Sepsis ([place]) -lactic acid was mildly elevated, IV fluids initiated and will trend lactic acid until within normal limits.  Blood pressure has been stable.  Sepsis is due to pneumonia, with questionable abdominal source as well.  IV antibiotics given.  Cultures sent Active Problems:   CAP (community acquired pneumonia) -IV antibiotics and supportive treatment   Gastroenteritis -IV antibiotics as above, supportive treatment.  Imaging questions partial small bowel obstruction.  We will keep her on only clear liquid diet for now and monitor closely.  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 Labs, imaging and/or ECG reviewed by provider and discussed with patient/family. Management plans discussed with the patient and/or family.  COVID-19 status: Tested negative     DVT PROPHYLAXIS: SubQ lovenox   GI PROPHYLAXIS:  None  ADMISSION STATUS: Inpatient     CODE STATUS: Full  TOTAL TIME TAKING CARE OF THIS PATIENT: 45 minutes.   This patient was evaluated in the context of the global COVID-19 pandemic, which necessitated consideration that the patient might be at risk for infection with the SARS-CoV-2 virus that causes COVID-19.  Institutional protocols and algorithms that pertain to the evaluation of patients at risk for COVID-19 are in a state of rapid change based on information released by regulatory bodies including the CDC and federal and state organizations. These policies and algorithms were followed to the best of this provider's knowledge to date during the patient's care at this facility.  Ethlyn Daniels 07/05/2019, 9:59 PM  [facility]  Office  [phone]  CC: Primary care physician; Rusty Aus, MD  Note:  This document was prepared using Dragon voice recognition software and may include unintentional dictation errors.

## 2019-07-06 LAB — BLOOD CULTURE ID PANEL (REFLEXED)

## 2019-07-06 LAB — URINALYSIS, COMPLETE (UACMP) WITH MICROSCOPIC
Bilirubin Urine: NEGATIVE
Glucose, UA: 50 mg/dL — AB
Ketones, ur: NEGATIVE mg/dL
Nitrite: NEGATIVE
Protein, ur: 100 mg/dL — AB
Specific Gravity, Urine: 1.023 (ref 1.005–1.030)
WBC, UA: >50 WBC/hpf — ABNORMAL HIGH (ref 0–5)
pH: 5 (ref 5.0–8.0)

## 2019-07-06 LAB — GLUCOSE, CAPILLARY
Glucose-Capillary: 189 mg/dL — ABNORMAL HIGH (ref 70–99)
Glucose-Capillary: 225 mg/dL — ABNORMAL HIGH (ref 70–99)
Glucose-Capillary: 198 mg/dL — ABNORMAL HIGH (ref 70–99)
Glucose-Capillary: 161 mg/dL — ABNORMAL HIGH (ref 70–99)

## 2019-07-06 LAB — BASIC METABOLIC PANEL
Anion gap: 12 (ref 5–15)
BUN: 11 mg/dL (ref 8–23)
CO2: 24 mmol/L (ref 22–32)
Calcium: 8.3 mg/dL — ABNORMAL LOW (ref 8.9–10.3)
Chloride: 102 mmol/L (ref 98–111)
Creatinine, Ser: 0.98 mg/dL (ref 0.44–1.00)
GFR calc Af Amer: >60 mL/min (ref >60)
GFR calc non Af Amer: 56 mL/min — ABNORMAL LOW (ref >60)
Glucose, Bld: 194 mg/dL — ABNORMAL HIGH (ref 70–99)
Potassium: 3.2 mmol/L — ABNORMAL LOW (ref 3.5–5.1)
Sodium: 138 mmol/L (ref 135–145)

## 2019-07-06 LAB — CLOSTRIDIUM DIFFICILE BY PCR, REFLEXED: Toxigenic C. Difficile by PCR: NEGATIVE

## 2019-07-06 LAB — CBC
HCT: 38.5 % (ref 36.0–46.0)
Hemoglobin: 12.4 g/dL (ref 12.0–15.0)
MCH: 28.4 pg (ref 26.0–34.0)
MCHC: 32.2 g/dL (ref 30.0–36.0)
MCV: 88.1 fL (ref 80.0–100.0)
Platelets: 214 10*3/uL (ref 150–400)
RBC: 4.37 MIL/uL (ref 3.87–5.11)
RDW: 13.2 % (ref 11.5–15.5)
WBC: 13 10*3/uL — ABNORMAL HIGH (ref 4.0–10.5)
nRBC: 0 % (ref 0.0–0.2)

## 2019-07-06 LAB — C DIFFICILE QUICK SCREEN W PCR REFLEX
C Diff antigen: POSITIVE — AB
C Diff toxin: NEGATIVE

## 2019-07-06 LAB — HEMOGLOBIN A1C
Hgb A1c MFr Bld: 7.7 % — ABNORMAL HIGH (ref 4.8–5.6)
Mean Plasma Glucose: 174.29 mg/dL

## 2019-07-06 LAB — PROCALCITONIN: Procalcitonin: 30.33 ng/mL

## 2019-07-06 LAB — LACTIC ACID, PLASMA: Lactic Acid, Venous: 1.2 mmol/L (ref 0.5–1.9)

## 2019-07-06 MED ORDER — SODIUM CHLORIDE 0.9 % IV SOLN
1.0000 g | Freq: Three times a day (TID) | INTRAVENOUS | Status: DC
  Administered 2019-07-06 – 2019-07-08 (×7): 1 g via INTRAVENOUS
  Filled 2019-07-06 (×10): qty 1

## 2019-07-06 MED ORDER — BRIMONIDINE TARTRATE 0.2 % OP SOLN
1.0000 [drp] | Freq: Two times a day (BID) | OPHTHALMIC | Status: DC
  Administered 2019-07-06 – 2019-07-09 (×6): 1 [drp] via OPHTHALMIC
  Filled 2019-07-06: qty 5

## 2019-07-06 MED ORDER — POTASSIUM CHLORIDE 10 MEQ/100ML IV SOLN
10.0000 meq | INTRAVENOUS | Status: AC
  Administered 2019-07-06 (×3): 10 meq via INTRAVENOUS
  Filled 2019-07-06 (×3): qty 100

## 2019-07-06 MED ORDER — BRIMONIDINE TARTRATE 0.2 % OP SOLN
1.0000 [drp] | Freq: Two times a day (BID) | OPHTHALMIC | Status: DC
  Filled 2019-07-06: qty 5

## 2019-07-06 MED ORDER — SODIUM CHLORIDE 0.9 % IV SOLN
INTRAVENOUS | Status: DC | PRN
  Administered 2019-07-06 (×2): 250 mL via INTRAVENOUS
  Administered 2019-07-07: 10 mL via INTRAVENOUS
  Administered 2019-07-07: 06:00:00 250 mL via INTRAVENOUS

## 2019-07-06 NOTE — Progress Notes (Signed)
Inpatient Diabetes Program Recommendations  AACE/ADA: New Consensus Statement on Inpatient Glycemic Control (2015)  Target Ranges:  Prepandial:   less than 140 mg/dL      Peak postprandial:   less than 180 mg/dL (1-2 hours)      Critically ill patients:  140 - 180 mg/dL   Results for Stephanie [name], Stephanie [name] (MRN [id number]) as of 07/06/2019 11:55  Ref. Range 07/05/2019 23:37 07/06/2019 07:27 07/06/2019 11:42  Glucose-Capillary Latest Ref Range: 70 - 99 mg/dL 216 (H)  2 units NOVOLOG  189 (H)  2 units NOVOLOG  225 (H)      Admit Sepsis due to Pneumonia/ Gastroenteritis  History: DM, Colon Ca, Previous SBO   Home DM Meds: Amaryl 1 mg Daily        Metformin XR 500 mg BID   Current Orders: Novolog Sensitive Correction Scale/ SSI (0-9 units) TID AC + HS       Current A1c= 7.7%.  CBG elevated to 225 mg/dl this AM.     MD- While home DM meds are on hold, please consider increasing the Novolog SSI to the Moderate scale (0-15 units) TID AC + HS     --Will follow patient during hospitalization--  Wyn Quaker RN, MSN, CDE Diabetes Coordinator Inpatient Glycemic Control Team Team Pager: [phone] (8a-5p)

## 2019-07-06 NOTE — Progress Notes (Signed)
Pharmacy Antibiotic Note  Stephanie [name] is a 77 y.o. female admitted on 07/05/2019 with bacteremia.  Pharmacy has been consulted for Meropenem dosing.  Micro lab reports E Coli 4 of 4 bottles +  Plan: Meropenem 1gm IV q8hrs  Height: 5\' 6"  (167.6 cm) Weight: 178 lb 9.6 oz (81 kg) IBW/kg (Calculated) : 59.3  Temp (24hrs), Avg:99.7 F (37.6 C), Min:98.6 F (37 C), Max:100.4 F (38 C)  Recent Labs  Lab 07/05/19 1446 07/05/19 1812  WBC 15.1*  15.0*  --   CREATININE 0.93  --   LATICACIDVEN  --  2.3*    Estimated Creatinine Clearance: 55.2 mL/min (by C-G formula based on SCr of 0.93 mg/dL).    Allergies  Allergen Reactions  . Ibuprofen Itching  . Indomethacin   . Infliximab   . Methotrexate Derivatives   . Moexipril   . Percocet [Oxycodone-Acetaminophen] Itching  . Naprosyn [Naproxen] Rash   Antimicrobials this admission: Rocephin 10/5 x 1  Zithromax 10/5 x 1 Meropenem 10/5 >>  Dose adjustments this admission:   Microbiology results:  BCx: 4 of 4 bottles w/ E COLI, KPC not detected per micro report  UCx:    Sputum:    MRSA PCR:   Thank you for allowing pharmacy to be a part of this patient's care.  Hart Robinsons A 07/06/2019 5:26 AM

## 2019-07-06 NOTE — Progress Notes (Signed)
PHARMACY - PHYSICIAN COMMUNICATION CRITICAL VALUE ALERT - BLOOD CULTURE IDENTIFICATION (BCID)  Stephanie [name] is an 78 y.o. female who presented to [facility] on 07/05/2019 with a chief complaint of nausea and cough.  Assessment:  4 of 4 bottles + for GNR, E Coli, KPC not detected  Name of physician (or Provider) Contacted: Dr Marcille Blanco  Current antibiotics: Rocephin and Zithromax  Changes to prescribed antibiotics recommended: change to Meropenem 1gm IV q8hrs Recommendations accepted by provider  Results for orders placed or performed during the hospital encounter of 07/05/19  Blood Culture ID Panel (Reflexed) (Collected: 07/05/2019  4:17 PM)  Result Value Ref Range   Enterococcus species NOT DETECTED NOT DETECTED   Listeria monocytogenes NOT DETECTED NOT DETECTED   Staphylococcus species NOT DETECTED NOT DETECTED   Staphylococcus aureus (BCID) NOT DETECTED NOT DETECTED   Streptococcus species NOT DETECTED NOT DETECTED   Streptococcus agalactiae NOT DETECTED NOT DETECTED   Streptococcus pneumoniae NOT DETECTED NOT DETECTED   Streptococcus pyogenes NOT DETECTED NOT DETECTED   Acinetobacter baumannii NOT DETECTED NOT DETECTED   Enterobacteriaceae species DETECTED (A) NOT DETECTED   Enterobacter cloacae complex NOT DETECTED NOT DETECTED   Escherichia coli DETECTED (A) NOT DETECTED   Klebsiella oxytoca NOT DETECTED NOT DETECTED   Klebsiella pneumoniae NOT DETECTED NOT DETECTED   Proteus species NOT DETECTED NOT DETECTED   Serratia marcescens NOT DETECTED NOT DETECTED   Carbapenem resistance NOT DETECTED NOT DETECTED   Haemophilus influenzae NOT DETECTED NOT DETECTED   Neisseria meningitidis NOT DETECTED NOT DETECTED   Pseudomonas aeruginosa NOT DETECTED NOT DETECTED   Candida albicans NOT DETECTED NOT DETECTED   Candida glabrata NOT DETECTED NOT DETECTED   Candida krusei NOT DETECTED NOT DETECTED   Candida parapsilosis NOT DETECTED NOT DETECTED   Candida tropicalis NOT DETECTED NOT  DETECTED    Hart Robinsons A 07/06/2019  5:01 AM

## 2019-07-06 NOTE — Progress Notes (Addendum)
[place] at [place] NAME: Stephanie [name]    MR#:  [id number]  DATE OF BIRTH:  [birth year]  SUBJECTIVE:   Patient states she is still feeling pretty bad this morning.  She just feels "overall sick".  She endorses cough productive of clear sputum.  She also endorses a couple episodes of diarrhea.  No abdominal pain, nausea, or vomiting.  She is having some mild shortness of breath.  No chest pain.  REVIEW OF SYSTEMS:  Review of Systems  Constitutional: Positive for fever. Negative for chills.  HENT: Negative for congestion and sore throat.   Eyes: Negative for blurred vision and double vision.  Respiratory: Positive for cough, sputum production and shortness of breath.   Cardiovascular: Negative for chest pain and palpitations.  Gastrointestinal: Positive for diarrhea. Negative for abdominal pain, constipation, nausea and vomiting.  Genitourinary: Negative for dysuria and urgency.  Musculoskeletal: Negative for back pain and neck pain.  Neurological: Negative for dizziness and headaches.  Psychiatric/Behavioral: Negative for depression. The patient is not nervous/anxious.     DRUG ALLERGIES:   Allergies  Allergen Reactions   Ibuprofen Itching   Indomethacin    Infliximab    Methotrexate Derivatives    Moexipril    Percocet [Oxycodone-Acetaminophen] Itching   Naprosyn [Naproxen] Rash   VITALS:  Blood pressure (!) 157/75, pulse 91, temperature 100 F (37.8 C), temperature source Oral, resp. rate 20, height 5\' 6"  (1.676 m), weight 81 kg, SpO2 98 %. PHYSICAL EXAMINATION:  Physical Exam  GENERAL:  Laying in the bed with no acute distress.  HEENT: Head atraumatic, normocephalic. Pupils equal, round, reactive to light and accommodation. No scleral icterus. Extraocular muscles intact. Oropharynx and nasopharynx clear.  NECK:  Supple, no jugular venous distention. No thyroid enlargement. LUNGS: No use of accessory muscles of  respiration. [place] in place. +faint left lower lobe crackles. Able to speak in full sentences. CARDIOVASCULAR: RRR, S1, S2 normal. No murmurs, rubs, or gallops.  ABDOMEN: Soft, nontender, +mild distension. Bowel sounds present.  EXTREMITIES: No pedal edema, cyanosis, or clubbing.  NEUROLOGIC: CN 2-12 intact, no focal deficits. 5/5 muscle strength throughout all extremities. Sensation intact throughout. Gait not checked.  PSYCHIATRIC: The patient is alert and oriented x 3.  SKIN: No obvious rash, lesion, or ulcer.  LABORATORY PANEL:  Female CBC Recent Labs  Lab 07/06/19 0519  WBC 13.0*  HGB 12.4  HCT 38.5  PLT 214   ------------------------------------------------------------------------------------------------------------------ Chemistries  Recent Labs  Lab 07/05/19 1446 07/06/19 0519  NA 138 138  K 3.5 3.2*  CL 99 102  CO2 24 24  GLUCOSE 299* 194*  BUN 10 11  CREATININE 0.93 0.98  CALCIUM 9.3 8.3*  AST 23  --   ALT 19  --   ALKPHOS 103  --   BILITOT 1.2  --    RADIOLOGY:  Ct Chest W Contrast  Result Date: 07/05/2019 CLINICAL DATA:  78 year old female with history of acute generalized abdominal pain with nausea, vomiting and diarrhea, with fatigue. EXAM: CT CHEST, ABDOMEN, AND PELVIS WITH CONTRAST TECHNIQUE: Multidetector CT imaging of the chest, abdomen and pelvis was performed following the standard protocol during bolus administration of intravenous contrast. CONTRAST:  119mL OMNIPAQUE IOHEXOL 300 MG/ML  SOLN COMPARISON:  CT the abdomen and pelvis 11/23/2013. FINDINGS: CT CHEST FINDINGS Cardiovascular: Heart size is normal. There is no significant pericardial fluid, thickening or pericardial calcification. There is aortic atherosclerosis, as well as atherosclerosis of the great vessels of  the mediastinum and the coronary arteries, including calcified atherosclerotic plaque in the left main and left anterior descending coronary arteries. Mediastinum/Nodes: No pathologically  enlarged mediastinal or hilar lymph nodes. Please note that accurate exclusion of hilar adenopathy is limited on noncontrast CT scans. Small hiatal hernia. No axillary lymphadenopathy. Lungs/Pleura: Several pulmonary nodules are noted in the lungs bilaterally, largest of which measures only 5 mm in the medial segment of the right middle lobe (axial image 85 of series 4). No larger more suspicious appearing pulmonary nodules or masses are noted. No acute consolidative airspace disease. No pleural effusions. Musculoskeletal: There are no aggressive appearing lytic or blastic lesions noted in the visualized portions of the skeleton. CT ABDOMEN PELVIS FINDINGS Hepatobiliary: No suspicious cystic or solid hepatic lesions. No intra or extrahepatic biliary ductal dilatation. Gallbladder is normal in appearance. Pancreas: No pancreatic mass. No pancreatic ductal dilatation. No pancreatic or peripancreatic fluid collections or inflammatory changes. Spleen: Unremarkable. Adrenals/Urinary Tract: Low-attenuation lesions in the kidneys bilaterally, compatible with simple cysts, measuring up to 1.5 cm in the lower pole collecting system of left kidney. Multiple other subcentimeter low-attenuation lesions, too small to characterize, but favored to represent tiny cysts. In addition, in the upper pole of the right kidney there is a poorly defined 1.5 cm intermediate attenuation lesion which is incompletely characterized (axial image 72 of series 2) which is new compared to the prior study from 2015, nonspecific. Mild right hydronephrosis. This terminates at the right ureteropelvic junction. No hydroureter. Urinary bladder is normal in appearance. Bilateral adrenal glands are normal in appearance. Stomach/Bowel: Normal appearance of the stomach. Multiple mildly dilated loops of small bowel measuring up to 3.7 cm in diameter with multiple air-fluid levels. Distal small bowel is relatively decompressed, as is the colon. Postoperative  changes of partial colectomy and near the rectosigmoid junction. The appendix is not confidently identified and may be surgically absent. Regardless, there are no inflammatory changes noted adjacent to the cecum to suggest the presence of an acute appendicitis at this time. Vascular/Lymphatic: Aortic atherosclerosis, without evidence of aneurysm or dissection in the abdominal or pelvic vasculature. Retroaortic left renal vein (normal anatomical variant). No lymphadenopathy noted in the abdomen or pelvis. Reproductive: Status post hysterectomy. Ovaries are not confidently identified may be surgically absent or atrophic Other: No significant volume of ascites.  No pneumoperitoneum. Musculoskeletal: Posterior rod and screw fixation device extending from T9 through L5. There are no aggressive appearing lytic or blastic lesions noted in the visualized portions of the skeleton. IMPRESSION: 1. Mild dilatation of multiple loops of mid small bowel with multiple air-fluid levels, with relative decompression of the distal small bowel and colon. Findings are concerning for early or partial small bowel obstruction. No discrete transition point confidently identified on today's examination. 2. No acute findings in the thorax. 3. 1.5 cm indeterminate lesion in the upper pole of the right kidney. Abdominal MRI with and without IV gadolinium is recommended in 3-6 months to re-evaluate this lesion and provide better characterization. 4. Aortic atherosclerosis, in addition to left main and left anterior descending coronary artery disease. Assessment for potential risk factor modification, dietary therapy or pharmacologic therapy may be warranted, if clinically indicated. 5. Additional incidental findings, as above. Electronically Signed   By: Vinnie Langton M.D.   On: 07/05/2019 19:55   Ct Abdomen Pelvis W Contrast  Result Date: 07/05/2019 CLINICAL DATA:  78 year old female with history of acute generalized abdominal pain with  nausea, vomiting and diarrhea, with fatigue. EXAM: CT CHEST,  ABDOMEN, AND PELVIS WITH CONTRAST TECHNIQUE: Multidetector CT imaging of the chest, abdomen and pelvis was performed following the standard protocol during bolus administration of intravenous contrast. CONTRAST:  169mL OMNIPAQUE IOHEXOL 300 MG/ML  SOLN COMPARISON:  CT the abdomen and pelvis 11/23/2013. FINDINGS: CT CHEST FINDINGS Cardiovascular: Heart size is normal. There is no significant pericardial fluid, thickening or pericardial calcification. There is aortic atherosclerosis, as well as atherosclerosis of the great vessels of the mediastinum and the coronary arteries, including calcified atherosclerotic plaque in the left main and left anterior descending coronary arteries. Mediastinum/Nodes: No pathologically enlarged mediastinal or hilar lymph nodes. Please note that accurate exclusion of hilar adenopathy is limited on noncontrast CT scans. Small hiatal hernia. No axillary lymphadenopathy. Lungs/Pleura: Several pulmonary nodules are noted in the lungs bilaterally, largest of which measures only 5 mm in the medial segment of the right middle lobe (axial image 85 of series 4). No larger more suspicious appearing pulmonary nodules or masses are noted. No acute consolidative airspace disease. No pleural effusions. Musculoskeletal: There are no aggressive appearing lytic or blastic lesions noted in the visualized portions of the skeleton. CT ABDOMEN PELVIS FINDINGS Hepatobiliary: No suspicious cystic or solid hepatic lesions. No intra or extrahepatic biliary ductal dilatation. Gallbladder is normal in appearance. Pancreas: No pancreatic mass. No pancreatic ductal dilatation. No pancreatic or peripancreatic fluid collections or inflammatory changes. Spleen: Unremarkable. Adrenals/Urinary Tract: Low-attenuation lesions in the kidneys bilaterally, compatible with simple cysts, measuring up to 1.5 cm in the lower pole collecting system of left kidney.  Multiple other subcentimeter low-attenuation lesions, too small to characterize, but favored to represent tiny cysts. In addition, in the upper pole of the right kidney there is a poorly defined 1.5 cm intermediate attenuation lesion which is incompletely characterized (axial image 72 of series 2) which is new compared to the prior study from 2015, nonspecific. Mild right hydronephrosis. This terminates at the right ureteropelvic junction. No hydroureter. Urinary bladder is normal in appearance. Bilateral adrenal glands are normal in appearance. Stomach/Bowel: Normal appearance of the stomach. Multiple mildly dilated loops of small bowel measuring up to 3.7 cm in diameter with multiple air-fluid levels. Distal small bowel is relatively decompressed, as is the colon. Postoperative changes of partial colectomy and near the rectosigmoid junction. The appendix is not confidently identified and may be surgically absent. Regardless, there are no inflammatory changes noted adjacent to the cecum to suggest the presence of an acute appendicitis at this time. Vascular/Lymphatic: Aortic atherosclerosis, without evidence of aneurysm or dissection in the abdominal or pelvic vasculature. Retroaortic left renal vein (normal anatomical variant). No lymphadenopathy noted in the abdomen or pelvis. Reproductive: Status post hysterectomy. Ovaries are not confidently identified may be surgically absent or atrophic Other: No significant volume of ascites.  No pneumoperitoneum. Musculoskeletal: Posterior rod and screw fixation device extending from T9 through L5. There are no aggressive appearing lytic or blastic lesions noted in the visualized portions of the skeleton. IMPRESSION: 1. Mild dilatation of multiple loops of mid small bowel with multiple air-fluid levels, with relative decompression of the distal small bowel and colon. Findings are concerning for early or partial small bowel obstruction. No discrete transition point  confidently identified on today's examination. 2. No acute findings in the thorax. 3. 1.5 cm indeterminate lesion in the upper pole of the right kidney. Abdominal MRI with and without IV gadolinium is recommended in 3-6 months to re-evaluate this lesion and provide better characterization. 4. Aortic atherosclerosis, in addition to left main and left  anterior descending coronary artery disease. Assessment for potential risk factor modification, dietary therapy or pharmacologic therapy may be warranted, if clinically indicated. 5. Additional incidental findings, as above. Electronically Signed   By: Vinnie Langton M.D.   On: 07/05/2019 19:55   Dg Chest Portable 1 View  Result Date: 07/05/2019 CLINICAL DATA:  Body aches.  Fever. EXAM: PORTABLE CHEST 1 VIEW COMPARISON:  03/15/2008 chest radiograph. FINDINGS: Partially visualized bilateral posterior spinal fusion hardware extending inferiorly from the lower thoracic spine. Stable cardiomediastinal silhouette with normal heart size. No pneumothorax. No pleural effusion. Mild curvilinear left lung base opacity. Otherwise clear lungs. IMPRESSION: Mild curvilinear left lung base opacity, favoring scarring versus atelectasis. Otherwise no active disease. Electronically Signed   By: Ilona Sorrel M.D.   On: 07/05/2019 16:50   ASSESSMENT AND PLAN:   Sepsis with gram-negative rod bacteremia- may be due to abdominal source vs urinary source. No UA was ordered on admission. CT abdomen showed a normal gallbladder. She was initially being treated for CAP, but her CT chest was clear. PCT is elevated.  Lactic acidosis has resolved. -Continue meropenem -Follow-up blood and urine cultures -Continue IV fluids -Trend procalcitonin  Early/partial SBO- seen on CT abdomen/pelvis on admission.  Patient denies any abdominal pain this morning.  She did have a bowel movement today. -Continue clear liquid diet and advance as tolerated  Hypokalemia/hypomagnesemia -Replete and  recheck  Right kidney lesion- incidental finding on CT abdomen/pelvis -Needs abdominal MRI with and without contrast in 3-6 months to reevaluate  Hypertension- normotensive today -Continue home Norvasc, HCTZ, metoprolol  Type 2 diabetes-blood sugars mildly elevated -Continue SSI  Hyperlipidemia-stable -Continue home simvastatin  Called husband, [name], and updated him on the plan.  All the records are reviewed and case discussed with Care Management/Social Worker. Management plans discussed with the patient, family and they are in agreement.  CODE STATUS: Full Code  TOTAL TIME TAKING CARE OF THIS PATIENT: 45 minutes.   More than 50% of the time was spent in counseling/coordination of care: YES  POSSIBLE D/C IN 2-3 DAYS, DEPENDING ON CLINICAL CONDITION.   Berna Spare Amauri Keefe M.D on 07/06/2019 at 12:10 PM  Between 7am to 6pm - Pager - [phone]  After 6pm go to [email or web address] - Proofreader  [facility]  Office  [phone]  CC: Primary care physician; Rusty Aus, MD  Note: This dictation was prepared with Dragon dictation along with smaller phrase technology. Any transcriptional errors that result from this process are unintentional.

## 2019-07-07 LAB — BASIC METABOLIC PANEL
Anion gap: 9 (ref 5–15)
BUN: 11 mg/dL (ref 8–23)
CO2: 23 mmol/L (ref 22–32)
Calcium: 8 mg/dL — ABNORMAL LOW (ref 8.9–10.3)
Chloride: 100 mmol/L (ref 98–111)
Creatinine, Ser: 1 mg/dL (ref 0.44–1.00)
GFR calc Af Amer: >60 mL/min (ref >60)
GFR calc non Af Amer: 55 mL/min — ABNORMAL LOW (ref >60)
Glucose, Bld: 221 mg/dL — ABNORMAL HIGH (ref 70–99)
Potassium: 2.9 mmol/L — ABNORMAL LOW (ref 3.5–5.1)
Sodium: 132 mmol/L — ABNORMAL LOW (ref 135–145)

## 2019-07-07 LAB — GLUCOSE, CAPILLARY
Glucose-Capillary: 182 mg/dL — ABNORMAL HIGH (ref 70–99)
Glucose-Capillary: 202 mg/dL — ABNORMAL HIGH (ref 70–99)
Glucose-Capillary: 178 mg/dL — ABNORMAL HIGH (ref 70–99)

## 2019-07-07 LAB — MAGNESIUM: Magnesium: 1.4 mg/dL — ABNORMAL LOW (ref 1.7–2.4)

## 2019-07-07 LAB — CBC
HCT: 37.4 % (ref 36.0–46.0)
Hemoglobin: 11.9 g/dL — ABNORMAL LOW (ref 12.0–15.0)
MCH: 27.9 pg (ref 26.0–34.0)
MCHC: 31.8 g/dL (ref 30.0–36.0)
MCV: 87.6 fL (ref 80.0–100.0)
Platelets: 199 10*3/uL (ref 150–400)
RBC: 4.27 MIL/uL (ref 3.87–5.11)
RDW: 13.2 % (ref 11.5–15.5)
WBC: 10.1 10*3/uL (ref 4.0–10.5)
nRBC: 0 % (ref 0.0–0.2)

## 2019-07-07 LAB — PROCALCITONIN: Procalcitonin: 23.36 ng/mL

## 2019-07-07 MED ORDER — MAGNESIUM SULFATE 4 GM/100ML IV SOLN
4.0000 g | Freq: Once | INTRAVENOUS | Status: AC
  Administered 2019-07-07: 4 g via INTRAVENOUS
  Filled 2019-07-07: qty 100

## 2019-07-07 MED ORDER — POTASSIUM CHLORIDE CRYS ER 20 MEQ PO TBCR
40.0000 meq | EXTENDED_RELEASE_TABLET | ORAL | Status: AC
  Administered 2019-07-07 (×2): 40 meq via ORAL
  Filled 2019-07-07 (×3): qty 2

## 2019-07-07 MED ORDER — POTASSIUM CHLORIDE 10 MEQ/100ML IV SOLN
10.0000 meq | INTRAVENOUS | Status: AC
  Administered 2019-07-07 (×3): 10 meq via INTRAVENOUS
  Filled 2019-07-07 (×4): qty 100

## 2019-07-07 MED ORDER — INSULIN ASPART 100 UNIT/ML ~~LOC~~ SOLN
0.0000 [IU] | Freq: Three times a day (TID) | SUBCUTANEOUS | Status: DC
  Administered 2019-07-07: 12:00:00 5 [IU] via SUBCUTANEOUS
  Administered 2019-07-07: 3 [IU] via SUBCUTANEOUS
  Administered 2019-07-08 (×2): 5 [IU] via SUBCUTANEOUS
  Administered 2019-07-08: 3 [IU] via SUBCUTANEOUS
  Administered 2019-07-09 (×2): 5 [IU] via SUBCUTANEOUS
  Filled 2019-07-07 (×8): qty 1

## 2019-07-07 NOTE — Progress Notes (Signed)
[place] at [place] NAME: Stephanie [name]    MR#:  [id number]  DATE OF BIRTH:  01-22-1941  SUBJECTIVE:   Patient states she is feeling little bit better this morning she.  She is continuing to have a few episodes of watery diarrhea.  No nausea or vomiting.  She is tolerating a clear liquid diet without any issues.  She did have a fever to 101F overnight.  REVIEW OF SYSTEMS:  Review of Systems  Constitutional: Positive for fever. Negative for chills.  HENT: Negative for congestion and sore throat.   Eyes: Negative for blurred vision and double vision.  Respiratory: Negative for cough and shortness of breath.   Cardiovascular: Negative for chest pain and palpitations.  Gastrointestinal: Positive for diarrhea. Negative for abdominal pain, constipation, nausea and vomiting.  Genitourinary: Negative for dysuria and urgency.  Musculoskeletal: Negative for back pain and neck pain.  Neurological: Negative for dizziness and headaches.  Psychiatric/Behavioral: Negative for depression. The patient is not nervous/anxious.     DRUG ALLERGIES:   Allergies  Allergen Reactions  . Ibuprofen Itching  . Indomethacin   . Infliximab   . Methotrexate Derivatives   . Moexipril   . Percocet [Oxycodone-Acetaminophen] Itching  . Naprosyn [Naproxen] Rash   VITALS:  Blood pressure 140/73, pulse 84, temperature 99.2 F (37.3 C), temperature source Oral, resp. rate 20, height 5\' 6"  (1.676 m), weight 81 kg, SpO2 97 %. PHYSICAL EXAMINATION:  Physical Exam  GENERAL:  Laying in the bed with no acute distress.  HEENT: Head atraumatic, normocephalic. Pupils equal, round, reactive to light and accommodation. No scleral icterus. Extraocular muscles intact. Oropharynx and nasopharynx clear.  NECK:  Supple, no jugular venous distention. No thyroid enlargement. LUNGS: No use of accessory muscles of respiration. [place] in place. +faint left lower lobe crackles. Able to speak  in full sentences. CARDIOVASCULAR: RRR, S1, S2 normal. No murmurs, rubs, or gallops.  ABDOMEN: Soft, nontender, + moderate abdominal distension.  Bowel sounds present.  EXTREMITIES: No pedal edema, cyanosis, or clubbing.  NEUROLOGIC: CN 2-12 intact, no focal deficits. 5/5 muscle strength throughout all extremities. Sensation intact throughout. Gait not checked.  PSYCHIATRIC: The patient is alert and oriented x 3.  SKIN: No obvious rash, lesion, or ulcer.  LABORATORY PANEL:  Female CBC Recent Labs  Lab 07/07/19 0343  WBC 10.1  HGB 11.9*  HCT 37.4  PLT 199   ------------------------------------------------------------------------------------------------------------------ Chemistries  Recent Labs  Lab 07/05/19 1446  07/07/19 0343  NA 138   < > 132*  K 3.5   < > 2.9*  CL 99   < > 100  CO2 24   < > 23  GLUCOSE 299*   < > 221*  BUN 10   < > 11  CREATININE 0.93   < > 1.00  CALCIUM 9.3   < > 8.0*  MG  --   --  1.4*  AST 23  --   --   ALT 19  --   --   ALKPHOS 103  --   --   BILITOT 1.2  --   --    < > = values in this interval not displayed.   RADIOLOGY:  No results found. ASSESSMENT AND PLAN:   Gram-negative rod UTI and bacteremia- she did have a fever overnight, but sepsis has resolved. -Continue meropenem -Follow-up blood and urine cultures -Continue IV fluids  Early/partial SBO- seen on CT abdomen/pelvis on admission.  Has been having  some episodes of diarrhea.  She is tolerating a clear diet without any vomiting or abdominal pain. -Advance diet to full liquids today  Hypokalemia/hypomagnesemia -Replete and recheck  Right kidney lesion- incidental finding on CT abdomen/pelvis -Needs abdominal MRI with and without contrast in 3-6 months to reevaluate  Hypertension- normotensive -Continue home Norvasc, HCTZ, metoprolol  Type 2 diabetes-blood sugars mildly elevated -Continue SSI  Hyperlipidemia-stable -Continue home simvastatin  Called husband, Stephanie [name], and  updated him on the plan.  All the records are reviewed and case discussed with Care Management/Social Worker. Management plans discussed with the patient, family and they are in agreement.  CODE STATUS: Full Code  TOTAL TIME TAKING CARE OF THIS PATIENT: 45 minutes.   More than 50% of the time was spent in counseling/coordination of care: YES  POSSIBLE D/C IN 2-3 DAYS, DEPENDING ON CLINICAL CONDITION.   Berna Spare Luverne Farone M.D on 07/07/2019 at 11:28 AM  Between 7am to 6pm - Pager - [phone]  After 6pm go to [email or web address] - Proofreader  [facility] [place] [facility]  Office  [phone]  CC: Primary care physician; Rusty Aus, MD  Note: This dictation was prepared with Dragon dictation along with smaller phrase technology. Any transcriptional errors that result from this process are unintentional.

## 2019-07-07 NOTE — TOC Progression Note (Signed)
Transition of Care [facility]) - Progression Note    Patient Details  Name: Stephanie [name] MRN: [id number] Date of Birth: [birth year]  Transition of [facility]) CM/SW Contact  Katrina Stack, RN Phone Number: 07/07/2019, 5:40 PM  Clinical Narrative:    Has had temp up to 101 within last 24 hours. Continues with diarrhea and is on clear liquids. Discussed need to wean from oxygen during progression.  Patient does not have underlying chronic cardiopulmonary medical issues documented.         Expected Discharge Plan and Services                                                 Social Determinants of Health (SDOH) Interventions    Readmission Risk Interventions No flowsheet data found.

## 2019-07-08 ENCOUNTER — Inpatient Hospital Stay: Payer: Medicare Other

## 2019-07-08 LAB — GLUCOSE, CAPILLARY
Glucose-Capillary: 228 mg/dL — ABNORMAL HIGH (ref 70–99)
Glucose-Capillary: 232 mg/dL — ABNORMAL HIGH (ref 70–99)
Glucose-Capillary: 220 mg/dL — ABNORMAL HIGH (ref 70–99)
Glucose-Capillary: 158 mg/dL — ABNORMAL HIGH (ref 70–99)
Glucose-Capillary: 221 mg/dL — ABNORMAL HIGH (ref 70–99)

## 2019-07-08 LAB — CBC
HCT: 37.2 % (ref 36.0–46.0)
Hemoglobin: 11.9 g/dL — ABNORMAL LOW (ref 12.0–15.0)
MCH: 27.4 pg (ref 26.0–34.0)
MCHC: 32 g/dL (ref 30.0–36.0)
MCV: 85.7 fL (ref 80.0–100.0)
Platelets: 198 10*3/uL (ref 150–400)
RBC: 4.34 MIL/uL (ref 3.87–5.11)
RDW: 13 % (ref 11.5–15.5)
WBC: 5.9 10*3/uL (ref 4.0–10.5)
nRBC: 0 % (ref 0.0–0.2)

## 2019-07-08 LAB — BASIC METABOLIC PANEL
Anion gap: 9 (ref 5–15)
BUN: 12 mg/dL (ref 8–23)
CO2: 24 mmol/L (ref 22–32)
Calcium: 8.3 mg/dL — ABNORMAL LOW (ref 8.9–10.3)
Chloride: 102 mmol/L (ref 98–111)
Creatinine, Ser: 0.76 mg/dL (ref 0.44–1.00)
GFR calc Af Amer: >60 mL/min (ref >60)
GFR calc non Af Amer: >60 mL/min (ref >60)
Glucose, Bld: 210 mg/dL — ABNORMAL HIGH (ref 70–99)
Potassium: 3.3 mmol/L — ABNORMAL LOW (ref 3.5–5.1)
Sodium: 135 mmol/L (ref 135–145)

## 2019-07-08 LAB — CULTURE, BLOOD (ROUTINE X 2)
Special Requests: ADEQUATE
Special Requests: ADEQUATE

## 2019-07-08 LAB — URINE CULTURE: Culture: >=100000 — AB

## 2019-07-08 LAB — MAGNESIUM: Magnesium: 2.3 mg/dL (ref 1.7–2.4)

## 2019-07-08 MED ORDER — TAMSULOSIN HCL 0.4 MG PO CAPS
0.4000 mg | ORAL_CAPSULE | Freq: Two times a day (BID) | ORAL | Status: DC
  Administered 2019-07-08 – 2019-07-09 (×3): 0.4 mg via ORAL
  Filled 2019-07-08 (×3): qty 1

## 2019-07-08 MED ORDER — POTASSIUM CHLORIDE 20 MEQ PO PACK
40.0000 meq | PACK | Freq: Once | ORAL | Status: AC
  Administered 2019-07-08: 17:00:00 40 meq via ORAL
  Filled 2019-07-08: qty 2

## 2019-07-08 MED ORDER — SODIUM CHLORIDE 0.9 % IV SOLN
2.0000 g | INTRAVENOUS | Status: DC
  Administered 2019-07-08: 13:00:00 2 g via INTRAVENOUS
  Filled 2019-07-08 (×3): qty 20

## 2019-07-08 NOTE — Progress Notes (Signed)
Inpatient Diabetes Program Recommendations  AACE/ADA: New Consensus Statement on Inpatient Glycemic Control (2015)  Target Ranges:  Prepandial:   less than 140 mg/dL      Peak postprandial:   less than 180 mg/dL (1-2 hours)      Critically ill patients:  140 - 180 mg/dL   Results for CALIOPE, RUPPERT (MRN [id number]) as of 07/08/2019 13:18  Ref. Range 07/07/2019 07:36 07/07/2019 11:53 07/07/2019 16:36  Glucose-Capillary Latest Ref Range: 70 - 99 mg/dL 182 (H) 202 (H) 178 (H)    Results for LENI, PANKONIN (MRN [id number]) as of 07/08/2019 13:18  Ref. Range 07/08/2019 07:38 07/08/2019 11:45  Glucose-Capillary Latest Ref Range: 70 - 99 mg/dL 232 (H) 220 (H)     Admit Sepsis due to Pneumonia/ Gastroenteritis  History: DM, Colon Ca, Previous SBO   Home DM Meds: Amaryl 1 mg Daily                              Metformin XR 500 mg BID   Current Orders: Novolog Moderate Correction Scale/ SSI (0-15 units) TID AC + HS      MD- Note CBGs in the low 200s today.    Home DM Meds are hold at present.  May consider adding low dose basal insulin for hospital use to bring CBGs down while home meds on hold:  Lantus 8 units Daily (0.1 units/kg)     --Will follow patient during hospitalization--  Wyn Quaker RN, MSN, CDE Diabetes Coordinator Inpatient Glycemic Control Team Team Pager: [phone] (8a-5p)

## 2019-07-08 NOTE — Progress Notes (Signed)
[place] [place] NAME: Stephanie [name]    MR#:  [id number]  DATE OF BIRTH:  02/03/1941  SUBJECTIVE:   Patient states she feels well this morning.  She wants to go home.  She has been able to tolerate a full liquid diet without any issues over the last 24 hours.  She had a fever to 101F overnight.  She states she was sweaty and had chills at that time.  She otherwise has been feeling fine.  REVIEW OF SYSTEMS:  Review of Systems  Constitutional: Positive for fever. Negative for chills.  HENT: Negative for congestion and sore throat.   Eyes: Negative for blurred vision and double vision.  Respiratory: Negative for cough and shortness of breath.   Cardiovascular: Negative for chest pain and palpitations.  Gastrointestinal: Positive for diarrhea. Negative for abdominal pain, constipation, nausea and vomiting.  Genitourinary: Negative for dysuria and urgency.  Musculoskeletal: Negative for back pain and neck pain.  Neurological: Negative for dizziness and headaches.  Psychiatric/Behavioral: Negative for depression. The patient is not nervous/anxious.     DRUG ALLERGIES:   Allergies  Allergen Reactions  . Ibuprofen Itching  . Indomethacin   . Infliximab   . Methotrexate Derivatives   . Moexipril   . Percocet [Oxycodone-Acetaminophen] Itching  . Naprosyn [Naproxen] Rash   VITALS:  Blood pressure (!) 155/79, pulse (!) 101, temperature 99.2 F (37.3 C), temperature source Oral, resp. rate 20, height 5\' 6"  (1.676 m), weight 81 kg, SpO2 97 %. PHYSICAL EXAMINATION:  Physical Exam  GENERAL:  Laying in the bed with no acute distress.  HEENT: Head atraumatic, normocephalic. Pupils equal, round, reactive to light and accommodation. No scleral icterus. Extraocular muscles intact. Oropharynx and nasopharynx clear.  NECK:  Supple, no jugular venous distention. No thyroid enlargement. LUNGS: No use of accessory muscles of respiration. [place] in place.  +faint left lower lobe crackles. Able to speak in full sentences. CARDIOVASCULAR: RRR, S1, S2 normal. No murmurs, rubs, or gallops.  ABDOMEN: Soft, nontender, + moderate abdominal distension.  Bowel sounds present.  EXTREMITIES: No pedal edema, cyanosis, or clubbing.  NEUROLOGIC: CN 2-12 intact, no focal deficits. 5/5 muscle strength throughout all extremities. Sensation intact throughout. Gait not checked.  PSYCHIATRIC: The patient is alert and oriented x 3.  SKIN: No obvious rash, lesion, or ulcer.  LABORATORY PANEL:  Female CBC Recent Labs  Lab 07/08/19 0342  WBC 5.9  HGB 11.9*  HCT 37.2  PLT 198   ------------------------------------------------------------------------------------------------------------------ Chemistries  Recent Labs  Lab 07/05/19 1446  07/08/19 0342  NA 138   < > 135  K 3.5   < > 3.3*  CL 99   < > 102  CO2 24   < > 24  GLUCOSE 299*   < > 210*  BUN 10   < > 12  CREATININE 0.93   < > 0.76  CALCIUM 9.3   < > 8.3*  MG  --    < > 2.3  AST 23  --   --   ALT 19  --   --   ALKPHOS 103  --   --   BILITOT 1.2  --   --    < > = values in this interval not displayed.   RADIOLOGY:  Dg Chest 1 View  Result Date: 07/08/2019 CLINICAL DATA:  Fever EXAM: CHEST  1 VIEW COMPARISON:  07/05/2019 FINDINGS: The heart size and mediastinal contours are within normal limits. Both  lungs are clear. Partially imaged lumbar fusion. Disc degenerative disease of the thoracic spine. IMPRESSION: No acute abnormality of the lungs in AP portable projection. Electronically Signed   By: Eddie Candle M.D.   On: 07/08/2019 10:52   ASSESSMENT AND PLAN:   E. Coli UTI and bacteremia- initially meeting sepsis criteria, but this has resolved. She did have a temperature to 101F overnight, but is clinically improving. -Switch from meropenem to ceftriaxone -Stop IV fluids  Early/partial SBO- seen on CT abdomen/pelvis on admission.  SBO is improving and patient is tolerating a diet without any  vomiting.  She is also having bowel movements. -Advance to soft diet today  Hypokalemia/hypomagnesemia -Replete and recheck  Right kidney lesion- incidental finding on CT abdomen/pelvis -Needs abdominal MRI with and without contrast in 3-6 months to reevaluate  Hypertension- normotensive -Continue home Norvasc, HCTZ, metoprolol  Type 2 diabetes-blood sugars mildly elevated -Continue SSI  Hyperlipidemia-stable -Continue home simvastatin  Husband, [name], was at bedside and was updated on the plan  All the records are reviewed and case discussed with Care Management/Social Worker. Management plans discussed with the patient, family and they are in agreement.  CODE STATUS: Full Code  TOTAL TIME TAKING CARE OF THIS PATIENT: 40 minutes.   More than 50% of the time was spent in counseling/coordination of care: YES  POSSIBLE D/C IN 1-2 DAYS, DEPENDING ON CLINICAL CONDITION.   Berna Spare Lamiyah Schlotter M.D on 07/08/2019 at 12:14 PM  Between 7am to 6pm - Pager [phone]  After 6pm go to [email or web address] - Proofreader  [facility]  Office  [phone]  CC: Primary care physician; Rusty Aus, MD  Note: This dictation was prepared with Dragon dictation along with smaller phrase technology. Any transcriptional errors that result from this process are unintentional.

## 2019-07-09 LAB — BASIC METABOLIC PANEL
Anion gap: 12 (ref 5–15)
BUN: 12 mg/dL (ref 8–23)
CO2: 24 mmol/L (ref 22–32)
Calcium: 8.6 mg/dL — ABNORMAL LOW (ref 8.9–10.3)
Chloride: 98 mmol/L (ref 98–111)
Creatinine, Ser: 0.86 mg/dL (ref 0.44–1.00)
GFR calc Af Amer: >60 mL/min (ref >60)
GFR calc non Af Amer: >60 mL/min (ref >60)
Glucose, Bld: 216 mg/dL — ABNORMAL HIGH (ref 70–99)
Potassium: 3.2 mmol/L — ABNORMAL LOW (ref 3.5–5.1)
Sodium: 134 mmol/L — ABNORMAL LOW (ref 135–145)

## 2019-07-09 LAB — GI PATHOGEN PANEL BY PCR, STOOL

## 2019-07-09 LAB — GLUCOSE, CAPILLARY
Glucose-Capillary: 204 mg/dL — ABNORMAL HIGH (ref 70–99)
Glucose-Capillary: 224 mg/dL — ABNORMAL HIGH (ref 70–99)

## 2019-07-09 MED ORDER — INFLUENZA VAC A&B SA ADJ QUAD 0.5 ML IM PRSY
0.5000 mL | PREFILLED_SYRINGE | INTRAMUSCULAR | Status: AC
  Administered 2019-07-09: 0.5 mL via INTRAMUSCULAR
  Filled 2019-07-09: qty 0.5

## 2019-07-09 MED ORDER — CIPROFLOXACIN HCL 500 MG PO TABS: 500.0000 mg | ORAL_TABLET | Freq: Two times a day (BID) | ORAL | 0 refills | Status: AC

## 2019-07-09 MED ORDER — POTASSIUM CHLORIDE CRYS ER 20 MEQ PO TBCR
40.0000 meq | EXTENDED_RELEASE_TABLET | Freq: Once | ORAL | Status: AC
  Administered 2019-07-09: 40 meq via ORAL
  Filled 2019-07-09: qty 2

## 2019-07-09 NOTE — Discharge Instructions (Signed)
It was so nice to meet you during this hospitalization!  You came into the hospital with weakness. We found that you had a urinary tract infection that spread to your blood. We gave you antibiotics through your IV for this. I have prescribed an antibiotic for you to go home with. Please take Ciprofloxacin 1 tablet tonight and then twice a day for the next two days. You should also see the urologist in the next week.  We also found that you had an early blockage of your intestine. We think this got better because you were able to eat without having any abdominal pain or vomiting. Please make sure you are drinking plenty of fluids at home.  Take care, Dr. Brett Albino

## 2019-07-09 NOTE — Progress Notes (Signed)
Stephanie H Van  [name] and O x 4 VSS. Pt tolerating diet well. No complaints of pain or nausea. IV removed intact, prescriptions given. Pt voices understanding of discharge instructions with no further questions. Pt discharged via wheelchair with axillary.   Allergies as of 07/09/2019      Reactions   Ibuprofen Itching   Indomethacin    Infliximab    Methotrexate Derivatives    Moexipril    Percocet [oxycodone-acetaminophen] Itching   Naprosyn [naproxen] Rash      Medication List    TAKE these medications   amLODipine 10 MG tablet Commonly known as: NORVASC Take 5 mg by mouth 2 (two) times daily.   amoxicillin 500 MG capsule Commonly known as: AMOXIL Take 2,000 mg by mouth as directed. Take 1 hour before procedure   brimonidine 0.2 % ophthalmic solution Commonly known as: ALPHAGAN Place 1 drop into both eyes 2 (two) times daily.   CALCIUM 500/D PO Take 1 tablet by mouth daily.   ciprofloxacin 500 MG tablet Commonly known as: Cipro Take 1 tablet (500 mg total) by mouth 2 (two) times daily for 3 days.   Contour Test test strip Generic drug: glucose blood 1 each by Other route daily.   folic acid 1 MG tablet Commonly known as: FOLVITE Take 1 mg by mouth daily.   glimepiride 1 MG tablet Commonly known as: AMARYL Take 1 mg by mouth daily.   hydrochlorothiazide 25 MG tablet Commonly known as: HYDRODIURIL Take 25 mg by mouth daily.   leflunomide 10 MG tablet Commonly known as: ARAVA Take 10 mg by mouth daily.   metFORMIN 500 MG 24 hr tablet Commonly known as: GLUCOPHAGE-XR Take 500 mg by mouth 2 (two) times daily.   metoprolol tartrate 50 MG tablet Commonly known as: LOPRESSOR Take 50 mg by mouth 2 (two) times daily.   omeprazole 40 MG capsule Commonly known as: PRILOSEC Take 40 mg by mouth daily.   potassium chloride SA 20 MEQ tablet Commonly known as: KLOR-CON Take 20 mEq by mouth daily.   simvastatin 20 MG tablet Commonly known as: ZOCOR Take 20 mg by  mouth daily.   tacrolimus 0.1 % ointment Commonly known as: PROTOPIC Apply 1 application topically 2 (two) times daily as needed (rash).   tamsulosin 0.4 MG Caps capsule Commonly known as: FLOMAX Take one capsule twice daily. What changed:   how much to take  how to take this  when to take this  additional instructions   vitamin B-12 1000 MCG tablet Commonly known as: CYANOCOBALAMIN Take 1,000 mcg by mouth daily.       Vitals:   07/09/19 0527 07/09/19 1324  BP: 128/75 118/80  Pulse: 93 82  Resp: 17 19  Temp: 99.1 F (37.3 C) 98.5 F (36.9 C)  SpO2: 95% 98%    Stephanie [name] Payton Mccallum

## 2019-07-09 NOTE — Discharge Summary (Signed)
[facility] NAME: Stephanie [name]    MR#:  [id number]  DATE OF BIRTH:  09/06/1941  DATE OF ADMISSION:  07/05/2019   ADMITTING PHYSICIAN: Lance Coon, MD  DATE OF DISCHARGE: 07/09/19  PRIMARY CARE PHYSICIAN: Rusty Aus, MD   ADMISSION DIAGNOSIS:  Partial small bowel obstruction (HCC) [K56.600] Acute respiratory failure with hypoxia ([place]) [J96.01] Type 2 diabetes mellitus with other specified complication, unspecified whether long term insulin use ([place]) [E11.69] DISCHARGE DIAGNOSIS:  Principal Problem:   Sepsis ([place]) Active Problems:   Benign essential hypertension   DM type 2 with diabetic mixed hyperlipidemia ([place])   CAP (community acquired pneumonia)   Gastroenteritis  SECONDARY DIAGNOSIS:   Past Medical History:  Diagnosis Date  . Arthritis   . Asthma   . Cancer ([place]) 1996   colon  . Diabetes mellitus without complication ([place])   . GERD (gastroesophageal reflux disease)   . Glaucoma   . Hyperlipidemia   . Hypertension   . Lumbar disc disease   . Small bowel obstruction ([place])   . Vitamin B 12 deficiency    HOSPITAL COURSE:   Stephanie [name] is a 78 year old female who presented to the ED with nausea, vomiting, and diarrhea.  In the ED, she was meeting sepsis criteria.  Chest x-ray showed a questionable pneumonia and CT abdomen/pelvis showed a early/partial SBO.  She was admitted for further management.  E. Coli UTI and bacteremia- initially meeting sepsis criteria, but this has resolved. She was afebrile for 24 hours prior to discharge -Initially treated with meropenem and then transitioned to ciprofloxacin on discharge for a total 10-day course -Patient should follow-up with urology for evaluation of recurrent UTIs  Early/partial SBO- seen on CT abdomen/pelvis on admission.  -SBO has resolved and patient has been tolerating a diet for the last 3 days without any issues -Having regular bowel movements and no abdominal  pain  Hypokalemia/hypomagnesemia- repleted -Needs BMP and magnesium rechecked on discharge  Right kidney lesion- incidental finding on CT abdomen/pelvis -Needs abdominal MRI with and without contrast in 3-6 months to re-evaluate  Hypertension- normotensive this admission -Continued home Norvasc, HCTZ, metoprolol  Type 2 diabetes- blood sugars mildly elevated this admission -Diabetes medicines were continued on discharge  Hyperlipidemia-stable -Continuedome simvastatin  DISCHARGE CONDITIONS:  E. coli UTI and bacteremia Early SBO Hypokalemia Right kidney lesion Hypertension Type 2 diabetes Hyperlipidemia CONSULTS OBTAINED:  None DRUG ALLERGIES:   Allergies  Allergen Reactions  . Ibuprofen Itching  . Indomethacin   . Infliximab   . Methotrexate Derivatives   . Moexipril   . Percocet [Oxycodone-Acetaminophen] Itching  . Naprosyn [Naproxen] Rash   DISCHARGE MEDICATIONS:   Allergies as of 07/09/2019      Reactions   Ibuprofen Itching   Indomethacin    Infliximab    Methotrexate Derivatives    Moexipril    Percocet [oxycodone-acetaminophen] Itching   Naprosyn [naproxen] Rash      Medication List    TAKE these medications   amLODipine 10 MG tablet Commonly known as: NORVASC Take 5 mg by mouth 2 (two) times daily.   amoxicillin 500 MG capsule Commonly known as: AMOXIL Take 2,000 mg by mouth as directed. Take 1 hour before procedure   brimonidine 0.2 % ophthalmic solution Commonly known as: ALPHAGAN Place 1 drop into both eyes 2 (two) times daily.   CALCIUM 500/D PO Take 1 tablet by mouth daily.   ciprofloxacin 500 MG tablet Commonly known as: Cipro  Take 1 tablet (500 mg total) by mouth 2 (two) times daily for 3 days.   Contour Test test strip Generic drug: glucose blood 1 each by Other route daily.   folic acid 1 MG tablet Commonly known as: FOLVITE Take 1 mg by mouth daily.   glimepiride 1 MG tablet Commonly known as: AMARYL Take 1 mg by  mouth daily.   hydrochlorothiazide 25 MG tablet Commonly known as: HYDRODIURIL Take 25 mg by mouth daily.   leflunomide 10 MG tablet Commonly known as: ARAVA Take 10 mg by mouth daily.   metFORMIN 500 MG 24 hr tablet Commonly known as: GLUCOPHAGE-XR Take 500 mg by mouth 2 (two) times daily.   metoprolol tartrate 50 MG tablet Commonly known as: LOPRESSOR Take 50 mg by mouth 2 (two) times daily.   omeprazole 40 MG capsule Commonly known as: PRILOSEC Take 40 mg by mouth daily.   potassium chloride SA 20 MEQ tablet Commonly known as: KLOR-CON Take 20 mEq by mouth daily.   simvastatin 20 MG tablet Commonly known as: ZOCOR Take 20 mg by mouth daily.   tacrolimus 0.1 % ointment Commonly known as: PROTOPIC Apply 1 application topically 2 (two) times daily as needed (rash).   tamsulosin 0.4 MG Caps capsule Commonly known as: FLOMAX Take one capsule twice daily. What changed:   how much to take  how to take this  when to take this  additional instructions   vitamin B-12 1000 MCG tablet Commonly known as: CYANOCOBALAMIN Take 1,000 mcg by mouth daily.        DISCHARGE INSTRUCTIONS:  1.  Follow-up with PCP in 5 days 2.  Follow-up with urology in 1-2 weeks 3.  Take ciprofloxacin twice a day for a total 7-day course DIET:  Cardiac diet and Diabetic diet DISCHARGE CONDITION:  Stable ACTIVITY:  Activity as tolerated OXYGEN:  Home Oxygen: No.  Oxygen Delivery: room air DISCHARGE LOCATION:  home   If you experience worsening of your admission symptoms, develop shortness of breath, life threatening emergency, suicidal or homicidal thoughts you must seek medical attention immediately by calling 911 or calling your MD immediately  if symptoms less severe.  You Must read complete instructions/literature along with all the possible adverse reactions/side effects for all the Medicines you take and that have been prescribed to you. Take any new Medicines after you have  completely understood and accpet all the possible adverse reactions/side effects.   Please note  You were cared for by a hospitalist during your hospital stay. If you have any questions about your discharge medications or the care you received while you were in the hospital after you are discharged, you can call the unit and asked to speak with the hospitalist on call if the hospitalist that took care of you is not available. Once you are discharged, your primary care physician will handle any further medical issues. Please note that NO REFILLS for any discharge medications will be authorized once you are discharged, as it is imperative that you return to your primary care physician (or establish a relationship with a primary care physician if you do not have one) for your aftercare needs so that they can reassess your need for medications and monitor your lab values.    On the day of Discharge:  VITAL SIGNS:  Blood pressure 128/75, pulse 93, temperature 99.1 F (37.3 C), temperature source Oral, resp. rate 17, height 5\' 6"  (1.676 m), weight 81 kg, SpO2 95 %. PHYSICAL EXAMINATION:  GENERAL:  78 y.o.-year-old patient lying in the bed with no acute distress.  Well-appearing. EYES: Pupils equal, round, reactive to light and accommodation. No scleral icterus. Extraocular muscles intact.  HEENT: Head atraumatic, normocephalic. Oropharynx and nasopharynx clear.  NECK:  Supple, no jugular venous distention. No thyroid enlargement, no tenderness.  LUNGS: Normal breath sounds bilaterally, no wheezing, rales,rhonchi or crepitation. No use of accessory muscles of respiration.  CARDIOVASCULAR: RRR, S1, S2 normal. No murmurs, rubs, or gallops.  ABDOMEN: Soft, non-tender, Bowel sounds present. No organomegaly or mass. + Mild abdominal distention EXTREMITIES: No pedal edema, cyanosis, or clubbing.  NEUROLOGIC: Cranial nerves II through XII are intact. Muscle strength 5/5 in all extremities. Sensation intact.  Gait not checked.  PSYCHIATRIC: The patient is alert and oriented x 3.  SKIN: No obvious rash, lesion, or ulcer.  DATA REVIEW:   CBC Recent Labs  Lab 07/08/19 0342  WBC 5.9  HGB 11.9*  HCT 37.2  PLT 198    Chemistries  Recent Labs  Lab 07/05/19 1446  07/08/19 0342 07/09/19 0346  NA 138   < > 135 134*  K 3.5   < > 3.3* 3.2*  CL 99   < > 102 98  CO2 24   < > 24 24  GLUCOSE 299*   < > 210* 216*  BUN 10   < > 12 12  CREATININE 0.93   < > 0.76 0.86  CALCIUM 9.3   < > 8.3* 8.6*  MG  --    < > 2.3  --   AST 23  --   --   --   ALT 19  --   --   --   ALKPHOS 103  --   --   --   BILITOT 1.2  --   --   --    < > = values in this interval not displayed.     Microbiology Results  Results for orders placed or performed during the hospital encounter of 07/05/19  SARS Coronavirus 2 [facility] order, Performed in [facility] hospital lab) Nasopharyngeal Nasopharyngeal Swab     Status: None   Collection Time: 07/05/19  4:12 PM   Specimen: Nasopharyngeal Swab  Result Value Ref Range Status   SARS Coronavirus 2 NEGATIVE NEGATIVE Final    Comment: (NOTE) If result is NEGATIVE SARS-CoV-2 target nucleic acids are NOT DETECTED. The SARS-CoV-2 RNA is generally detectable in upper and lower  respiratory specimens during the acute phase of infection. The lowest  concentration of SARS-CoV-2 viral copies this assay can detect is 250  copies / mL. A negative result does not preclude SARS-CoV-2 infection  and should not be used as the sole basis for treatment or other  patient management decisions.  A negative result may occur with  improper specimen collection / handling, submission of specimen other  than nasopharyngeal swab, presence of viral mutation(s) within the  areas targeted by this assay, and inadequate number of viral copies  (<250 copies / mL). A negative result must be combined with clinical  observations, patient history, and epidemiological information. If result is POSITIVE  SARS-CoV-2 target nucleic acids are DETECTED. The SARS-CoV-2 RNA is generally detectable in upper and lower  respiratory specimens dur ing the acute phase of infection.  Positive  results are indicative of active infection with SARS-CoV-2.  Clinical  correlation with patient history and other diagnostic information is  necessary to determine patient infection status.  Positive results do  not rule out bacterial infection  or co-infection with other viruses. If result is PRESUMPTIVE POSTIVE SARS-CoV-2 nucleic acids MAY BE PRESENT.   A presumptive positive result was obtained on the submitted specimen  and confirmed on repeat testing.  While 2019 novel coronavirus  (SARS-CoV-2) nucleic acids may be present in the submitted sample  additional confirmatory testing may be necessary for epidemiological  and / or clinical management purposes  to differentiate between  SARS-CoV-2 and other Sarbecovirus currently known to infect humans.  If clinically indicated additional testing with an alternate test  methodology [phone]) is advised. The SARS-CoV-2 RNA is generally  detectable in upper and lower respiratory sp ecimens during the acute  phase of infection. The expected result is Negative. Fact Sheet for Patients:  StrictlyIdeas.no Fact Sheet for Healthcare Providers: BankingDealers.co.za This test is not yet approved or cleared by the [place] FDA and has been authorized for detection and/or diagnosis of SARS-CoV-2 by FDA under an Emergency Use Authorization (EUA).  This EUA will remain in effect (meaning this test can be used) for the duration of the COVID-19 declaration under Section 564(b)(1) of the Act, 21 U.S.C. section 360bbb-3(b)(1), unless the authorization is terminated or revoked sooner. Performed at [facility], [address] [place], [place] [address]   Culture, blood (routine x 2)     Status: Abnormal    Collection Time: 07/05/19  4:12 PM   Specimen: BLOOD  Result Value Ref Range Status   Specimen Description   Final    BLOOD RIGHT ANTECUBITAL Performed at [facility], [address], [place], [place] [address]    Special Requests   Final    BOTTLES DRAWN AEROBIC AND ANAEROBIC Blood Culture adequate volume Performed at [facility], [address], [place], [place] [address]    Culture  Setup Time   Final    GRAM NEGATIVE RODS IN BOTH AEROBIC AND ANAEROBIC BOTTLES CRITICAL VALUE NOTED.  VALUE IS CONSISTENT WITH PREVIOUSLY REPORTED AND CALLED VALUE. Performed at [facility], [address] [place], [place] [address]    Culture (A)  Final    ESCHERICHIA COLI SUSCEPTIBILITIES PERFORMED ON PREVIOUS CULTURE WITHIN THE LAST 5 DAYS. Performed at [facility], [place] [address] [place], [place] [address]    Report Status 07/08/2019 FINAL  Final  Urine culture     Status: Abnormal   Collection Time: 07/05/19  4:12 PM   Specimen: Urine, Random  Result Value Ref Range Status   Specimen Description   Final    URINE, RANDOM Performed at [facility], [address] [place], [place] [address]    Special Requests   Final    NONE Performed at [facility], [address] [place], [place] [address]    Culture >=100,000 COLONIES/mL ESCHERICHIA COLI (A)  Final   Report Status 07/08/2019 FINAL  Final   Organism ID, Bacteria ESCHERICHIA COLI (A)  Final      Susceptibility   Escherichia coli - MIC*    AMPICILLIN >=32 RESISTANT Resistant     CEFAZOLIN <=4 SENSITIVE Sensitive     CEFTRIAXONE <=1 SENSITIVE Sensitive     CIPROFLOXACIN <=0.25 SENSITIVE Sensitive     GENTAMICIN <=1 SENSITIVE Sensitive     IMIPENEM <=0.25 SENSITIVE Sensitive     NITROFURANTOIN <=16 SENSITIVE Sensitive     TRIMETH/SULFA <=20 SENSITIVE Sensitive     AMPICILLIN/SULBACTAM >=32 RESISTANT Resistant     Extended ESBL NEGATIVE Sensitive     * >=100,000  COLONIES/mL ESCHERICHIA COLI  Culture, blood (routine x 2)     Status: Abnormal   Collection Time: 07/05/19  4:17 PM   Specimen: BLOOD  Result Value Ref Range Status   Specimen Description   Final    BLOOD LEFT ANTECUBITAL Performed at [facility], [address], [place], [place] [address]    Special Requests   Final    BOTTLES DRAWN AEROBIC AND ANAEROBIC Blood Culture adequate volume Performed at [facility], [address], [place], [place] [address]    Culture  Setup Time   Final    GRAM NEGATIVE RODS IN BOTH AEROBIC AND ANAEROBIC BOTTLES CRITICAL RESULT CALLED TO, READ BACK BY AND VERIFIED WITH: SCOTT HALL 07/06/2019 AT 0452 HS Performed at [facility], [place] [address], [place], [place] [address]    Culture ESCHERICHIA COLI (A)  Final   Report Status 07/08/2019 FINAL  Final   Organism ID, Bacteria ESCHERICHIA COLI  Final      Susceptibility   Escherichia coli - MIC*    AMPICILLIN >=32 RESISTANT Resistant     CEFAZOLIN <=4 SENSITIVE Sensitive     CEFEPIME <=1 SENSITIVE Sensitive     CEFTAZIDIME <=1 SENSITIVE Sensitive     CEFTRIAXONE <=1 SENSITIVE Sensitive     CIPROFLOXACIN <=0.25 SENSITIVE Sensitive     GENTAMICIN <=1 SENSITIVE Sensitive     IMIPENEM <=0.25 SENSITIVE Sensitive     TRIMETH/SULFA <=20 SENSITIVE Sensitive     AMPICILLIN/SULBACTAM >=32 RESISTANT Resistant     PIP/TAZO 8 SENSITIVE Sensitive     Extended ESBL NEGATIVE Sensitive     * ESCHERICHIA COLI  Blood Culture ID Panel (Reflexed)     Status: Abnormal   Collection Time: 07/05/19  4:17 PM  Result Value Ref Range Status   Enterococcus species NOT DETECTED NOT DETECTED Final   Listeria monocytogenes NOT DETECTED NOT DETECTED Final   Staphylococcus species NOT DETECTED NOT DETECTED Final   Staphylococcus aureus (BCID) NOT DETECTED NOT DETECTED Final   Streptococcus species NOT DETECTED NOT DETECTED Final   Streptococcus agalactiae NOT DETECTED NOT DETECTED Final   Streptococcus  pneumoniae NOT DETECTED NOT DETECTED Final   Streptococcus pyogenes NOT DETECTED NOT DETECTED Final   Acinetobacter baumannii NOT DETECTED NOT DETECTED Final   Enterobacteriaceae species DETECTED (A) NOT DETECTED Final    Comment: Enterobacteriaceae represent a large family of gram-negative bacteria, not a single organism. CRITICAL RESULT CALLED TO, READ BACK BY AND VERIFIED WITH: SCOTT HALL 07/06/2019 AT 9518 HS    Enterobacter cloacae complex NOT DETECTED NOT DETECTED Final   Escherichia coli DETECTED (A) NOT DETECTED Final    Comment: CRITICAL RESULT CALLED TO, READ BACK BY AND VERIFIED WITH: SCOTT HALL 07/06/2019 AT 0452 HS    Klebsiella oxytoca NOT DETECTED NOT DETECTED Final   Klebsiella pneumoniae NOT DETECTED NOT DETECTED Final   Proteus species NOT DETECTED NOT DETECTED Final   Serratia marcescens NOT DETECTED NOT DETECTED Final   Carbapenem resistance NOT DETECTED NOT DETECTED Final   Haemophilus influenzae NOT DETECTED NOT DETECTED Final   Neisseria meningitidis NOT DETECTED NOT DETECTED Final   Pseudomonas aeruginosa NOT DETECTED NOT DETECTED Final   Candida albicans NOT DETECTED NOT DETECTED Final   Candida glabrata NOT DETECTED NOT DETECTED Final   Candida krusei NOT DETECTED NOT DETECTED Final   Candida parapsilosis NOT DETECTED NOT DETECTED Final   Candida tropicalis NOT DETECTED NOT DETECTED Final    Comment: Performed at [facility], [place], [place]  [address]  C difficile quick scan w PCR reflex     Status: Abnormal   Collection Time: 07/06/19  9:12 PM   Specimen: STOOL  Result Value Ref Range Status   C Diff antigen POSITIVE (A) NEGATIVE Final   C Diff toxin NEGATIVE NEGATIVE Final   C Diff interpretation Results are indeterminate. See PCR results.  Final    Comment: Performed at [facility], [address] [place], [place] [address]  C. Diff by PCR, Reflexed     Status: None   Collection Time: 07/06/19  9:12 PM  Result  Value Ref Range Status   Toxigenic C. Difficile by PCR NEGATIVE NEGATIVE Final    Comment: Patient is colonized with non toxigenic C. difficile. May not need treatment unless significant symptoms are present. Performed at [facility], [address], [place], [place] [address]     RADIOLOGY:  No results found.   Management plans discussed with the patient, family and they are in agreement.  CODE STATUS: Full Code   TOTAL TIME TAKING CARE OF THIS PATIENT: 40 minutes.    Berna Spare Chihiro Frey M.D on 07/09/2019 at 9:29 AM  Between 7am to 6pm - Pager - [phone]  After 6pm go to [email or web address] - Proofreader  [facility]  Office  [phone]  CC: Primary care physician; Rusty Aus, MD   Note: This dictation was prepared with Dragon dictation along with smaller phrase technology. Any transcriptional errors that result from this process are unintentional.

## 2019-07-15 NOTE — Progress Notes (Signed)
07/16/2019 4:31 PM   Torrin Brent General [birth year] [id number]  Referring provider: Rusty Aus, MD [facility] [place],  [place] [address]  Chief Complaint  Patient presents with  . Hospitalization Follow-up    HPI: Patient is a 78 -year-old female with incomplete bladder emptying, vaginal atrophy and history of keratinizing squamous metaplasia of the bladder who presents today for a follow up after hospitalization for partial small bowel obstruction with bacteremia due to E. coli UTI with her husband, [name].   She presented to the emergency department on July 05, 1999 2020 with a complaints of shortness of breath and abdominal pain.  Contrast CT on 07/05/2019 revealed Low-attenuation lesions in the kidneys bilaterally, compatible with simple cysts, measuring up to 1.5 cm in the lower pole collecting system of left kidney. Multiple other subcentimeter low-attenuation lesions, too small to characterize, but favored to represent tiny cysts. In addition, in the upper pole of the right kidney there is a poorly defined 1.5 cm intermediate attenuation lesion which is incompletely characterized (axial image 72 of series 2) which is new compared to the prior study from 2015, nonspecific. Mild right hydronephrosis. This terminates at the right ureteropelvic junction. No hydroureter. Urinary bladder is normal in appearance. Bilateral adrenal glands are normal in appearance along with the small bowel obstruction.  Urine culture from 07/05/2019 positive for E. coli that was resistant to ampicillin and ampicillin/sulbactam.    She and her husband are wondering if a prophylactic low-dose daily antibiotic would be an option for her as the hospitalist suggested.  Today, she complains of nocturia.  Patient denies any gross hematuria, dysuria or suprapubic/flank pain.  Patient denies any fevers, chills, nausea or vomiting.  Her UA is benign.  Her bladder scan noted  a residual of 101 mL.  Her most recent cystoscopy was on 10/13/2018 and was NED.    Recent creatinine 0.86 on 07/09/2019.     PMH: Past Medical History:  Diagnosis Date  . Arthritis   . Asthma   . Cancer ([place]) 1996   colon  . Diabetes mellitus without complication ([place])   . GERD (gastroesophageal reflux disease)   . Glaucoma   . Hyperlipidemia   . Hypertension   . Lumbar disc disease   . Small bowel obstruction ([place])   . Vitamin B 12 deficiency     Surgical History: Past Surgical History:  Procedure Laterality Date  . ABDOMINAL HYSTERECTOMY    . BACK SURGERY    . COLON SURGERY    . COLONOSCOPY WITH PROPOFOL N/A 05/22/2017   Procedure: COLONOSCOPY WITH PROPOFOL;  Surgeon: Manya Silvas, MD;  Location: [facility] ENDOSCOPY;  Service: Endoscopy;  Laterality: N/A;  . FRACTURE SURGERY    . KNEE ARTHROSCOPY    . LUMBAR LAMINECTOMY      Home Medications:  Allergies as of 07/16/2019      Reactions   Ibuprofen Itching   Indomethacin    Infliximab    Methotrexate Derivatives    Moexipril    Percocet [oxycodone-acetaminophen] Itching   Naprosyn [naproxen] Rash      Medication List       Accurate as of July 16, 2019 11:59 PM. If you have any questions, ask your nurse or doctor.        amLODipine 10 MG tablet Commonly known as: NORVASC Take 5 mg by mouth 2 (two) times daily.   amoxicillin 500 MG capsule Commonly known as: AMOXIL Take 2,000 mg by  mouth as directed. Take 1 hour before procedure   brimonidine 0.2 % ophthalmic solution Commonly known as: ALPHAGAN Place 1 drop into both eyes 2 (two) times daily.   CALCIUM 500/D PO Take 1 tablet by mouth daily.   Contour Test test strip Generic drug: glucose blood 1 each by Other route daily.   folic acid 1 MG tablet Commonly known as: FOLVITE Take 1 mg by mouth daily.   glimepiride 1 MG tablet Commonly known as: AMARYL Take 1 mg by mouth daily.   hydrochlorothiazide 25 MG tablet Commonly known as:  HYDRODIURIL Take 25 mg by mouth daily.   leflunomide 10 MG tablet Commonly known as: ARAVA Take 10 mg by mouth daily.   metFORMIN 500 MG 24 hr tablet Commonly known as: GLUCOPHAGE-XR Take 500 mg by mouth 2 (two) times daily.   metoprolol tartrate 50 MG tablet Commonly known as: LOPRESSOR Take 50 mg by mouth 2 (two) times daily.   omeprazole 40 MG capsule Commonly known as: PRILOSEC Take 40 mg by mouth daily.   potassium chloride SA 20 MEQ tablet Commonly known as: KLOR-CON Take 20 mEq by mouth daily.   simvastatin 20 MG tablet Commonly known as: ZOCOR Take 20 mg by mouth daily.   tacrolimus 0.1 % ointment Commonly known as: PROTOPIC Apply 1 application topically 2 (two) times daily as needed (rash).   tamsulosin 0.4 MG Caps capsule Commonly known as: FLOMAX Take one capsule twice daily. What changed:   how much to take  how to take this  when to take this  additional instructions   vitamin B-12 1000 MCG tablet Commonly known as: CYANOCOBALAMIN Take 1,000 mcg by mouth daily.       Allergies:  Allergies  Allergen Reactions  . Ibuprofen Itching  . Indomethacin   . Infliximab   . Methotrexate Derivatives   . Moexipril   . Percocet [Oxycodone-Acetaminophen] Itching  . Naprosyn [Naproxen] Rash    Family History: Family History  Problem Relation Age of Onset  . Breast cancer Neg Hx     Social History:  reports that she has never smoked. She has never used smokeless tobacco. She reports that she does not drink alcohol or use drugs.  ROS: UROLOGY Frequent Urination?: No Hard to postpone urination?: No Burning/pain with urination?: No Get up at night to urinate?: Yes Leakage of urine?: No Urine stream starts and stops?: No Trouble starting stream?: No Do you have to strain to urinate?: No Blood in urine?: No Urinary tract infection?: No Sexually transmitted disease?: No Injury to kidneys or bladder?: No Painful intercourse?: No Weak stream?:  No Currently pregnant?: No Vaginal bleeding?: No Last menstrual period?: n  Gastrointestinal Nausea?: No Vomiting?: No Indigestion/heartburn?: No Diarrhea?: Yes Constipation?: No  Constitutional Fever: No Night sweats?: No Weight loss?: No Fatigue?: Yes  Skin Skin rash/lesions?: No Itching?: No  Eyes Blurred vision?: No Double vision?: No  Ears/Nose/Throat Sore throat?: No Sinus problems?: No  Hematologic/Lymphatic Swollen glands?: No Easy bruising?: No  Cardiovascular Leg swelling?: No Chest pain?: No  Respiratory Cough?: No Shortness of breath?: Yes  Endocrine Excessive thirst?: No  Musculoskeletal Back pain?: No Joint pain?: No  Neurological Headaches?: No Dizziness?: No  Psychologic Depression?: No Anxiety?: No  Physical Exam: BP 136/83   Pulse 82   Ht 5\' 6"  (1.676 m)   Wt 178 lb (80.7 kg)   BMI 28.73 kg/m   Constitutional:  Well nourished. Alert and oriented, No acute distress. HEENT:  AT, moist mucus  membranes.  Trachea midline, no masses. Cardiovascular: No clubbing, cyanosis, or edema. Respiratory: Normal respiratory effort, no increased work of breathing. Neurologic: Grossly intact, no focal deficits, moving all 4 extremities. Psychiatric: Normal mood and affect.  Laboratory Data: Lab Results  Component Value Date   WBC 5.9 07/08/2019   HGB 11.9 (L) 07/08/2019   HCT 37.2 07/08/2019   MCV 85.7 07/08/2019   PLT 198 07/08/2019    Lab Results  Component Value Date   CREATININE 0.86 07/09/2019    No results found for: PSA  No results found for: TESTOSTERONE  Lab Results  Component Value Date   HGBA1C 7.7 (H) 07/05/2019    No results found for: TSH  No results found for: CHOL, HDL, CHOLHDL, VLDL, LDLCALC  Lab Results  Component Value Date   AST 23 07/05/2019   Lab Results  Component Value Date   ALT 19 07/05/2019   No components found for: ALKALINEPHOPHATASE No components found for: BILIRUBINTOTAL  No  results found for: ESTRADIOL  I have reviewed the labs.   Pertinent Imaging:  Results for DOLORA, RIDGELY (MRN [id number]) as of 07/28/2019 16:18  Ref. Range 07/16/2019 11:38  Scan Result Unknown 135mL    Assessment & Plan:    1.  Incomplete bladder emptying Patient will continue tamsulosin 0.8 mg daily Discussed with her and her husband that having larger bladder residuals can contribute to rUTI's.  It would be advantageous to start self cathing to keep her residuals low.  She states her and her husband with discuss it further.  2. Vaginal atrophy Continue applying the vaginal estrogen cream 3 nights weekly Patient does not need a refill at this time  3. History of keratinizing squamous metaplasia of bladder Recent cysto - NED  4. Right renal lesion Needs to have surveillance MRI in January   Return for January for kidney MRI .  These notes generated with voice recognition software. I apologize for typographical errors.  Zara Council, PA-C  [facility] [address] [place], [place] [address] [phone]

## 2019-07-16 ENCOUNTER — Other Ambulatory Visit: Payer: Self-pay

## 2019-07-16 ENCOUNTER — Encounter: Payer: Self-pay | Admitting: Urology

## 2019-07-16 ENCOUNTER — Ambulatory Visit (INDEPENDENT_AMBULATORY_CARE_PROVIDER_SITE_OTHER): Payer: Medicare Other | Admitting: Urology

## 2019-07-16 VITALS — BP 136/83 | HR 82 | Ht 66.0 in | Wt 178.0 lb

## 2019-07-16 DIAGNOSIS — N281 Cyst of kidney, acquired: Secondary | ICD-10-CM

## 2019-07-16 DIAGNOSIS — N952 Postmenopausal atrophic vaginitis: Secondary | ICD-10-CM | POA: Diagnosis not present

## 2019-07-16 DIAGNOSIS — Z87440 Personal history of urinary (tract) infections: Secondary | ICD-10-CM

## 2019-07-16 DIAGNOSIS — R339 Retention of urine, unspecified: Secondary | ICD-10-CM | POA: Diagnosis not present

## 2019-07-16 LAB — MICROSCOPIC EXAMINATION
Bacteria, UA: NONE SEEN
Epithelial Cells (non renal): >10 /hpf — AB (ref 0–10)

## 2019-07-16 LAB — URINALYSIS, COMPLETE
Bilirubin, UA: NEGATIVE
Glucose, UA: NEGATIVE
Ketones, UA: NEGATIVE
Leukocytes,UA: NEGATIVE
Nitrite, UA: NEGATIVE
RBC, UA: NEGATIVE
Specific Gravity, UA: 1.025 (ref 1.005–1.030)
Urobilinogen, Ur: 0.2 mg/dL (ref 0.2–1.0)
pH, UA: 5 (ref 5.0–7.5)

## 2019-07-16 LAB — BLADDER SCAN AMB NON-IMAGING

## 2019-07-17 ENCOUNTER — Telehealth: Payer: Self-pay

## 2019-07-17 NOTE — Telephone Encounter (Signed)
Spoke with patients husband and advised him in office urine results were clear.  Will contact him when culture comes back.

## 2019-07-17 NOTE — Telephone Encounter (Signed)
-----   Message from Nori Riis, PA-C sent at 07/16/2019  4:52 PM EDT ----- Please let Stephanie [name] know that her urine was clear, but we will wait on culture for confirmation the infection has cleared.

## 2019-07-21 ENCOUNTER — Telehealth: Payer: Self-pay | Admitting: *Deleted

## 2019-07-21 LAB — CULTURE, URINE COMPREHENSIVE

## 2019-07-21 MED ORDER — SULFAMETHOXAZOLE-TRIMETHOPRIM 800-160 MG PO TABS: 1.0000 | ORAL_TABLET | Freq: Two times a day (BID) | ORAL | 0 refills | Status: DC

## 2019-07-21 NOTE — Telephone Encounter (Addendum)
Patient informed-verbalized understanding. Wanted to get infection cleared up before she considers self cathing   ----- Message from Nori Riis, PA-C sent at 07/21/2019  4:44 PM EDT ----- Please let Stephanie [name] know that her urine culture is positive for infection.  She needs to start Septra DS, BID x 7 days.  In regards to a low dose antibiotic to take daily, I am hesitant to do so as she has bacteria that are resistant to many antibiotics.  Has she made a decision regarding self cathing?  This will help keep her bladder empty and prevent infections.

## 2019-08-10 NOTE — Progress Notes (Signed)
08/11/2019 10:14 AM   Stephanie [name] Falling [birth year] [id number]  Referring provider: Rusty Aus, MD [facility] [place],  [place] [address]  Chief Complaint  Patient presents with  . incomplete bladder emptying    HPI: Patient is a 78 -year-old female with incomplete bladder emptying, vaginal atrophy, history of keratinizing squamous metaplasia of the bladder, renal lesion and recent hospitalization for partial small bowel obstruction with bacteremia due to E. coli UTI with her husband, Stephanie [name] to to discuss self-catheterization.  Background history She presented to the emergency department on July 05, 1999 2020 with a complaints of shortness of breath and abdominal pain.  Contrast CT on 07/05/2019 revealed Low-attenuation lesions in the kidneys bilaterally, compatible with simple cysts, measuring up to 1.5 cm in the lower pole collecting system of left kidney. Multiple other subcentimeter low-attenuation lesions, too small to characterize, but favored to represent tiny cysts. In addition, in the upper pole of the right kidney there is a poorly defined 1.5 cm intermediate attenuation lesion which is incompletely characterized (axial image 72 of series 2) which is new compared to the prior study from 2015, nonspecific. Mild right hydronephrosis. This terminates at the right ureteropelvic junction. No hydroureter. Urinary bladder is normal in appearance. Bilateral adrenal glands are normal in appearance along with the small bowel obstruction. Urine culture from 07/05/2019 positive for E. coli that was resistant to ampicillin and ampicillin/sulbactam.   She and her husband are wondering if a prophylactic low-dose daily antibiotic would be an option for her as the hospitalist suggested. Her most recent cystoscopy was on 10/13/2018 and was NED.   Recent creatinine 0.86 on 07/09/2019.      PMH: Past Medical History:  Diagnosis Date  . Arthritis   .  Asthma   . Cancer ([place]) 1996   colon  . Diabetes mellitus without complication ([place])   . GERD (gastroesophageal reflux disease)   . Glaucoma   . Hyperlipidemia   . Hypertension   . Lumbar disc disease   . Small bowel obstruction ([place])   . Vitamin B 12 deficiency     Surgical History: Past Surgical History:  Procedure Laterality Date  . ABDOMINAL HYSTERECTOMY    . BACK SURGERY    . COLON SURGERY    . COLONOSCOPY WITH PROPOFOL N/A 05/22/2017   Procedure: COLONOSCOPY WITH PROPOFOL;  Surgeon: Manya Silvas, MD;  Location: [facility] ENDOSCOPY;  Service: Endoscopy;  Laterality: N/A;  . FRACTURE SURGERY    . KNEE ARTHROSCOPY    . LUMBAR LAMINECTOMY      Home Medications:  Allergies as of 08/11/2019      Reactions   Ibuprofen Itching   Indomethacin    Infliximab    Methotrexate Derivatives    Moexipril    Percocet [oxycodone-acetaminophen] Itching   Naprosyn [naproxen] Rash      Medication List       Accurate as of August 11, 2019 10:14 AM. If you have any questions, ask your nurse or doctor.        STOP taking these medications   sulfamethoxazole-trimethoprim 800-160 MG tablet Commonly known as: BACTRIM DS Stopped by: Graziella Connery, PA-C     TAKE these medications   amLODipine 10 MG tablet Commonly known as: NORVASC Take 5 mg by mouth 2 (two) times daily.   amoxicillin-clavulanate 500-125 MG tablet Commonly known as: AUGMENTIN Take by mouth.   brimonidine 0.2 % ophthalmic solution Commonly known as: ALPHAGAN Place 1 drop  into both eyes 2 (two) times daily.   CALCIUM 500/D PO Take 1 tablet by mouth daily.   Contour Test test strip Generic drug: glucose blood 1 each by Other route daily.   folic acid 1 MG tablet Commonly known as: FOLVITE Take 1 mg by mouth daily.   glimepiride 1 MG tablet Commonly known as: AMARYL Take 1 mg by mouth daily.   hydrochlorothiazide 25 MG tablet Commonly known as: HYDRODIURIL Take 25 mg by mouth daily.    leflunomide 10 MG tablet Commonly known as: ARAVA Take 10 mg by mouth daily.   metFORMIN 500 MG 24 hr tablet Commonly known as: GLUCOPHAGE-XR Take 500 mg by mouth 2 (two) times daily.   metoprolol tartrate 50 MG tablet Commonly known as: LOPRESSOR Take 50 mg by mouth 2 (two) times daily.   omeprazole 40 MG capsule Commonly known as: PRILOSEC Take 40 mg by mouth daily.   potassium chloride SA 20 MEQ tablet Commonly known as: KLOR-CON Take 20 mEq by mouth daily.   simvastatin 20 MG tablet Commonly known as: ZOCOR Take 20 mg by mouth daily.   tacrolimus 0.1 % ointment Commonly known as: PROTOPIC Apply 1 application topically 2 (two) times daily as needed (rash).   tamsulosin 0.4 MG Caps capsule Commonly known as: FLOMAX Take one capsule twice daily. What changed:   how much to take  how to take this  when to take this  additional instructions   vitamin B-12 1000 MCG tablet Commonly known as: CYANOCOBALAMIN Take 1,000 mcg by mouth daily.       Allergies:  Allergies  Allergen Reactions  . Ibuprofen Itching  . Indomethacin   . Infliximab   . Methotrexate Derivatives   . Moexipril   . Percocet [Oxycodone-Acetaminophen] Itching  . Naprosyn [Naproxen] Rash    Family History: Family History  Problem Relation Age of Onset  . Breast cancer Neg Hx     Social History:  reports that she has never smoked. She has never used smokeless tobacco. She reports that she does not drink alcohol or use drugs.  ROS: UROLOGY Frequent Urination?: No Hard to postpone urination?: No Burning/pain with urination?: No Get up at night to urinate?: Yes Leakage of urine?: No Urine stream starts and stops?: No Trouble starting stream?: No Do you have to strain to urinate?: No Blood in urine?: No Urinary tract infection?: No Sexually transmitted disease?: No Injury to kidneys or bladder?: No Painful intercourse?: No Weak stream?: No Currently pregnant?: No Vaginal  bleeding?: No Last menstrual period?: n  Gastrointestinal Nausea?: No Vomiting?: No Indigestion/heartburn?: Yes Diarrhea?: No Constipation?: No  Constitutional Fever: No Night sweats?: No Weight loss?: No Fatigue?: No  Skin Skin rash/lesions?: No Itching?: No  Eyes Blurred vision?: No Double vision?: No  Ears/Nose/Throat Sore throat?: No Sinus problems?: No  Hematologic/Lymphatic Swollen glands?: No Easy bruising?: No  Cardiovascular Leg swelling?: No Chest pain?: No  Respiratory Cough?: No Shortness of breath?: No  Endocrine Excessive thirst?: No  Musculoskeletal Back pain?: No Joint pain?: No  Neurological Headaches?: No Dizziness?: No  Psychologic Depression?: No Anxiety?: No  Physical Exam: BP (!) 160/85   Pulse 93   Ht 5\' 6"  (1.676 m)   Wt 174 lb (78.9 kg)   BMI 28.08 kg/m   Constitutional:  Well nourished. Alert and oriented, No acute distress. HEENT: [place] AT, moist mucus membranes.  Trachea midline, no masses. Cardiovascular: No clubbing, cyanosis, or edema. Respiratory: Normal respiratory effort, no increased work of breathing.  Neurologic: Grossly intact, no focal deficits, moving all 4 extremities. Psychiatric: Normal mood and affect.   Laboratory Data: Lab Results  Component Value Date   WBC 5.9 07/08/2019   HGB 11.9 (L) 07/08/2019   HCT 37.2 07/08/2019   MCV 85.7 07/08/2019   PLT 198 07/08/2019    Lab Results  Component Value Date   CREATININE 0.86 07/09/2019    No results found for: PSA  No results found for: TESTOSTERONE  Lab Results  Component Value Date   HGBA1C 7.7 (H) 07/05/2019    No results found for: TSH  No results found for: CHOL, HDL, CHOLHDL, VLDL, LDLCALC  Lab Results  Component Value Date   AST 23 07/05/2019   Lab Results  Component Value Date   ALT 19 07/05/2019   No components found for: ALKALINEPHOPHATASE No components found for: BILIRUBINTOTAL  No results found for: ESTRADIOL  I  have reviewed the labs.  Procedure Continuous Intermittent Catheterization Due to incomplete bladder emptying and recurrent UTI patient is present today for a teaching of self I & O Catheterization. Patient was given detailed verbal and printed instructions of self catheterization. Patient was cleaned and prepped in a sterile fashion.  With instruction and assistance patient inserted a 14 FR and urine return was noted 50 ml, urine was yellow in color. Patient tolerated well, no complications were noted Patient was given a sample bag with supplies to take home.  Instructions were given per me for patient to cath one times daily and record residuals.  Performed by: Zara Council, PA-C   Assessment & Plan:    1.  Incomplete bladder emptying Patient will continue tamsulosin 0.8 mg daily Patient will catheterize herself once daily before bedtime and record residuals.  I will contact her by phone in 1 week to discuss her residuals.  She will contact us with any difficulty.  2. Vaginal atrophy Continue applying the vaginal estrogen cream 3 nights weekly Patient does not need a refill at this time  3. History of keratinizing squamous metaplasia of bladder Recent cysto - NED  4. Right renal lesion Needs to have surveillance MRI in January   Return for We will touch base by phone with patient in 1 week.  These notes generated with voice recognition software. I apologize for typographical errors.  Zara Council, PA-C  [place], [place] [address] [phone]  I spent 25 with this patient in a face to face conversation of which greater than 50% was spent in counseling and coordination of care with the patient with instructing on self catheterization techniques.

## 2019-08-11 ENCOUNTER — Encounter: Payer: Self-pay | Admitting: Urology

## 2019-08-11 ENCOUNTER — Ambulatory Visit (INDEPENDENT_AMBULATORY_CARE_PROVIDER_SITE_OTHER): Payer: Medicare Other | Admitting: Urology

## 2019-08-11 ENCOUNTER — Other Ambulatory Visit: Payer: Self-pay

## 2019-08-11 VITALS — BP 160/85 | HR 93 | Ht 66.0 in | Wt 174.0 lb

## 2019-08-11 DIAGNOSIS — Z87440 Personal history of urinary (tract) infections: Secondary | ICD-10-CM

## 2019-08-11 DIAGNOSIS — N952 Postmenopausal atrophic vaginitis: Secondary | ICD-10-CM | POA: Diagnosis not present

## 2019-08-11 DIAGNOSIS — R339 Retention of urine, unspecified: Secondary | ICD-10-CM

## 2019-08-18 ENCOUNTER — Telehealth: Payer: Self-pay | Admitting: Urology

## 2019-08-18 NOTE — Telephone Encounter (Signed)
Would you order catheters for Stephanie [name]? She will cath once daily. n She will need an appointment in one month and it can be virtual.

## 2019-08-18 NOTE — Telephone Encounter (Signed)
Pt called office and is expecting a phone call from you today.  She said she saw you last Tuesday 11/10 and you were supposed to call her in a week.

## 2019-08-18 NOTE — Telephone Encounter (Signed)
[employer] form filled out and faxed.

## 2019-08-31 ENCOUNTER — Telehealth: Payer: Self-pay | Admitting: Family Medicine

## 2019-08-31 NOTE — Telephone Encounter (Signed)
Patient needs a catheter indefinitely.

## 2019-10-07 ENCOUNTER — Telehealth: Payer: Self-pay | Admitting: Urology

## 2019-10-07 ENCOUNTER — Other Ambulatory Visit: Payer: Self-pay | Admitting: Urology

## 2019-10-07 DIAGNOSIS — N281 Cyst of kidney, acquired: Secondary | ICD-10-CM

## 2019-10-07 NOTE — Telephone Encounter (Signed)
Would you please call Stephanie [name] and let her know it is time to have a MRI of her kidneys to follow the lesion in her right kidney and we need to schedule a follow up visit to go over the results?

## 2019-10-08 NOTE — Telephone Encounter (Signed)
I called and spoke with Stephanie [name] and transferred her to scheduling, she is to call us back to make follow up appt.

## 2019-10-16 ENCOUNTER — Ambulatory Visit
Admission: RE | Admit: 2019-10-16 | Discharge: 2019-10-16 | Disposition: A | Discharge status: Home / Self Care | Payer: Medicare Other | Source: Ambulatory Visit | Attending: Urology | Admitting: Urology

## 2019-10-16 ENCOUNTER — Other Ambulatory Visit: Payer: Self-pay

## 2019-10-16 DIAGNOSIS — N281 Cyst of kidney, acquired: Secondary | ICD-10-CM | POA: Insufficient documentation

## 2019-10-16 LAB — POCT I-STAT CREATININE: Creatinine, Ser: 0.9 mg/dL (ref 0.44–1.00)

## 2019-10-16 MED ORDER — GADOBUTROL 1 MMOL/ML IV SOLN
7.0000 mL | Freq: Once | INTRAVENOUS | Status: AC | PRN
  Administered 2019-10-16: 09:00:00 7 mL via INTRAVENOUS

## 2019-10-19 NOTE — Progress Notes (Signed)
10/20/2019 11:10 AM   Stephanie [name] 11/27/1940 [id number]  Referring provider: Rusty Aus, MD [place] [facility] [place],  [place] [address]  Chief Complaint  Patient presents with  . Recurrent UTI    HPI: Patient is a 79 -year-old female with incomplete bladder emptying, vaginal atrophy, history of keratinizing squamous metaplasia of the bladder, renal lesion and recent hospitalization for partial small bowel obstruction with bacteremia due to E. coli UTI with her husband, [name] to to discuss MRI results.    Incomplete bladder emptying Results for Stephanie [name], Stephanie [name] (MRN [id number]) as of 10/20/2019 13:56  Ref. Range 03/06/2019 11:25 07/16/2019 11:38 10/20/2019 13:49  Scan Result Unknown 373ml 168mL 220   She is cathing once daily.    Vaginal atrophy Applying cream when she remembers.   History of keratinizing squamous metaplasia of bladder 10/2018 cysto with Dr. Bernardo Heater was NED  Renal lesion  Kidney MRI 10/2019 Adrenal glands are normal.  IN the upper pole of the RIGHT kidney, there is a small lesion which corresponds to the indeterminate lesion on comparison CT. This lesion measures 7 mm on image 13/2 and is hyperintense on T2 weighted imaging.  The T1 weighted imaging is degraded by patient respiratory motion.  The lesion is hyperintense on precontrast T1 weighted imaging (image 64/10) consistent with blood product. While the postcontrast imaging is severely degraded by respiratory motion lesion appears to have no post-contrast enhancement (series 12 series 16 for example)  No enhancing lesion identified within the LEFT or RIGHT kidney although imaging severely degraded as above.  She states that she is having dysuria and nocturia.  She is cathing before bed time.  Her husband states that her urine smells foul.  Patient denies any modifying or aggravating factors.  Patient denies any gross hematuria or suprapubic/flank pain.  Patient denies any  fevers, chills, nausea or vomiting.     Her UA yellow cloudy, +2 leukocytes, +3 protein on dip, greater than 30 WBCs, 11-30 RBCs, triple phosphate crystals are present and many bacteria.  PMH: Past Medical History:  Diagnosis Date  . Arthritis   . Asthma   . Cancer ([place]) 1996   colon  . Diabetes mellitus without complication ([place])   . GERD (gastroesophageal reflux disease)   . Glaucoma   . Hyperlipidemia   . Hypertension   . Lumbar disc disease   . Small bowel obstruction ([place])   . Vitamin B 12 deficiency     Surgical History: Past Surgical History:  Procedure Laterality Date  . ABDOMINAL HYSTERECTOMY    . BACK SURGERY    . COLON SURGERY    . COLONOSCOPY WITH PROPOFOL N/A 05/22/2017   Procedure: COLONOSCOPY WITH PROPOFOL;  Surgeon: Manya Silvas, MD;  Location: [facility] ENDOSCOPY;  Service: Endoscopy;  Laterality: N/A;  . FRACTURE SURGERY    . KNEE ARTHROSCOPY    . LUMBAR LAMINECTOMY      Home Medications:  Allergies as of 10/20/2019      Reactions   Ibuprofen Itching   Indomethacin    Infliximab    Methotrexate Derivatives    Moexipril    Percocet [oxycodone-acetaminophen] Itching   Naprosyn [naproxen] Rash      Medication List       Accurate as of October 20, 2019 11:59 PM. If you have any questions, ask your nurse or doctor.        amLODipine 10 MG tablet Commonly known as: NORVASC Take 5 mg by  mouth 2 (two) times daily.   brimonidine 0.2 % ophthalmic solution Commonly known as: ALPHAGAN Place 1 drop into both eyes 2 (two) times daily.   CALCIUM 500/D PO Take 1 tablet by mouth daily.   Contour Test test strip Generic drug: glucose blood 1 each by Other route daily.   folic acid 1 MG tablet Commonly known as: FOLVITE Take 1 mg by mouth daily.   glimepiride 1 MG tablet Commonly known as: AMARYL Take 1 mg by mouth daily.   hydrochlorothiazide 25 MG tablet Commonly known as: HYDRODIURIL Take 25 mg by mouth daily.   leflunomide 10 MG  tablet Commonly known as: ARAVA Take 10 mg by mouth daily.   metFORMIN 500 MG 24 hr tablet Commonly known as: GLUCOPHAGE-XR Take 500 mg by mouth 2 (two) times daily.   metoprolol tartrate 50 MG tablet Commonly known as: LOPRESSOR Take 50 mg by mouth 2 (two) times daily.   omeprazole 40 MG capsule Commonly known as: PRILOSEC Take 40 mg by mouth daily.   potassium chloride SA 20 MEQ tablet Commonly known as: KLOR-CON Take 20 mEq by mouth daily.   simvastatin 20 MG tablet Commonly known as: ZOCOR Take 20 mg by mouth daily.   tacrolimus 0.1 % ointment Commonly known as: PROTOPIC Apply 1 application topically 2 (two) times daily as needed (rash).   tamsulosin 0.4 MG Caps capsule Commonly known as: FLOMAX Take one capsule twice daily. What changed:   how much to take  how to take this  when to take this  additional instructions   vitamin B-12 1000 MCG tablet Commonly known as: CYANOCOBALAMIN Take 1,000 mcg by mouth daily.       Allergies:  Allergies  Allergen Reactions  . Ibuprofen Itching  . Indomethacin   . Infliximab   . Methotrexate Derivatives   . Moexipril   . Percocet [Oxycodone-Acetaminophen] Itching  . Naprosyn [Naproxen] Rash    Family History: Family History  Problem Relation Age of Onset  . Breast cancer Neg Hx     Social History:  reports that she has never smoked. She has never used smokeless tobacco. She reports that she does not drink alcohol or use drugs.  ROS: UROLOGY Frequent Urination?: No Hard to postpone urination?: Yes Burning/pain with urination?: Yes Get up at night to urinate?: No Leakage of urine?: No Urine stream starts and stops?: No Trouble starting stream?: No Do you have to strain to urinate?: No Blood in urine?: No Urinary tract infection?: No Sexually transmitted disease?: No Injury to kidneys or bladder?: No Painful intercourse?: No Weak stream?: No Currently pregnant?: No Vaginal bleeding?: No Last  menstrual period?: n  Gastrointestinal Nausea?: No Vomiting?: No Indigestion/heartburn?: No Diarrhea?: No Constipation?: No  Constitutional Fever: No Night sweats?: No Weight loss?: No Fatigue?: No  Skin Skin rash/lesions?: No Itching?: No  Eyes Blurred vision?: No Double vision?: No  Ears/Nose/Throat Sore throat?: No Sinus problems?: No  Hematologic/Lymphatic Swollen glands?: No Easy bruising?: No  Cardiovascular Leg swelling?: No Chest pain?: No  Respiratory Cough?: No Shortness of breath?: No  Endocrine Excessive thirst?: No  Musculoskeletal Back pain?: No Joint pain?: No  Neurological Headaches?: No Dizziness?: No  Psychologic Depression?: No Anxiety?: No  Physical Exam: BP (!) 145/83   Pulse 76   Ht 5\' 6"  (1.676 m)   Wt 172 lb (78 kg)   BMI 27.76 kg/m   Constitutional:  Well nourished. Alert and oriented, No acute distress. HEENT:  AT, mask in place.  Trachea midline, no masses. Cardiovascular: No clubbing, cyanosis, or edema. Respiratory: Normal respiratory effort, no increased work of breathing. Neurologic: Grossly intact, no focal deficits, moving all 4 extremities. Psychiatric: Normal mood and affect.   Laboratory Data: Urinalysis Component     Latest Ref Rng & Units 10/21/2019  Specific Gravity, UA     1.005 - 1.030 1.010  pH, UA     5.0 - 7.5 >9.0 (H)  Color, UA     Yellow Yellow  Appearance Ur     Clear Cloudy (A)  Leukocytes,UA     Negative 2+ (A)  Protein,UA     Negative/Trace 3+ (A)  Glucose, UA     Negative Negative  Ketones, UA     Negative Negative  RBC, UA     Negative Negative  Bilirubin, UA     Negative Negative  Urobilinogen, Ur     0.2 - 1.0 mg/dL 0.2  Nitrite, UA     Negative Negative  Microscopic Examination      See below:   Component     Latest Ref Rng & Units 10/21/2019          WBC, UA     0 - 5 /hpf >30 (A)  RBC     0 - 2 /hpf 11-30 (A)  Epithelial Cells (non renal)     0 - 10  /hpf None seen  Crystals     N/A Present (A)  Crystal Type     N/A Triple Phosphate  Bacteria, UA     None seen/Few Many (A)   Lab Results  Component Value Date   WBC 5.9 07/08/2019   HGB 11.9 (L) 07/08/2019   HCT 37.2 07/08/2019   MCV 85.7 07/08/2019   PLT 198 07/08/2019    Lab Results  Component Value Date   CREATININE 0.90 10/16/2019    No results found for: PSA  No results found for: TESTOSTERONE  Lab Results  Component Value Date   HGBA1C 7.7 (H) 07/05/2019    No results found for: TSH  No results found for: CHOL, HDL, CHOLHDL, VLDL, LDLCALC  Lab Results  Component Value Date   AST 23 07/05/2019   Lab Results  Component Value Date   ALT 19 07/05/2019   No components found for: ALKALINEPHOPHATASE No components found for: BILIRUBINTOTAL  No results found for: ESTRADIOL  I have reviewed the labs.  Pertinent Imaging CLINICAL DATA:  Indeterminate renal lesion on CT. MRI recommended further evaluation  EXAM: MRI ABDOMEN WITHOUT AND WITH CONTRAST  TECHNIQUE: Multiplanar multisequence MR imaging of the abdomen was performed both before and after the administration of intravenous contrast.  CONTRAST:  80mL GADAVIST GADOBUTROL 1 MMOL/ML IV SOLN  COMPARISON:  CT 07/05/2019  FINDINGS: Lower chest:  Lung bases are clear.  Hepatobiliary: No focal hepatic lesion. Gallbladder normal. Common bile duct normal.  Pancreas: Normal pancreatic parenchymal intensity. No ductal dilatation or inflammation.  Spleen: Normal spleen.  Adrenals/urinary tract: Adrenal glands are normal.  IN the upper pole of the RIGHT kidney, there is a small lesion which corresponds to the indeterminate lesion on comparison CT. This lesion measures 7 mm on image 13/2 and is hyperintense on T2 weighted imaging.  The T1 weighted imaging is degraded by patient respiratory motion. The lesion is hyperintense on precontrast T1 weighted imaging (image 64/10) consistent  with blood product. While the postcontrast imaging is severely degraded by respiratory motion lesion appears to have no post-contrast enhancement (series 12 series 16 for  example)  No enhancing lesion identified within the LEFT or RIGHT kidney although imaging severely degraded as above.  Stomach/Bowel: Stomach and limited of the small bowel is unremarkable  Vascular/Lymphatic: Abdominal aortic normal caliber. No retroperitoneal periportal lymphadenopathy.  Musculoskeletal: No aggressive osseous lesion  IMPRESSION: 1. While the postcontrast imaging is severely degraded patient respiratory motion, the previously indeterminate lesion in the upper pole of the RIGHT kidney has imaging characteristics most consistent with a benign hemorrhagic cyst. 2. Normal liver and biliary tree.   Electronically Signed   By: Suzy Bouchard M.D.   On: 10/16/2019 09:00 I have independently reviewed the films and note the lesion.   Assessment & Plan:    1.  Incomplete bladder emptying Patient will continue tamsulosin 0.8 mg daily Continue the straight cathing nightly  2. Vaginal atrophy Continue applying the vaginal estrogen cream 3 nights weekly Patient does not need a refill at this time  3. Right renal lesion Determined to be a benign cyst on recent MRI   4. Dysuria UA is sent for culture - will hold on prescribing antibiotic until culture and sensitivities are available.   Return in about 1 year (around 10/19/2020) for OAB questionnaire, PVR and exam.  These notes generated with voice recognition software. I apologize for typographical errors.  Zara Council, PA-C  [facility] [address] [place], [place] [address] [phone]

## 2019-10-20 ENCOUNTER — Encounter: Payer: Self-pay | Admitting: Urology

## 2019-10-20 ENCOUNTER — Other Ambulatory Visit: Payer: Self-pay

## 2019-10-20 ENCOUNTER — Ambulatory Visit (INDEPENDENT_AMBULATORY_CARE_PROVIDER_SITE_OTHER): Payer: Medicare Other | Admitting: Urology

## 2019-10-20 VITALS — BP 145/83 | HR 76 | Ht 66.0 in | Wt 172.0 lb

## 2019-10-20 DIAGNOSIS — N952 Postmenopausal atrophic vaginitis: Secondary | ICD-10-CM

## 2019-10-20 DIAGNOSIS — R339 Retention of urine, unspecified: Secondary | ICD-10-CM | POA: Diagnosis not present

## 2019-10-20 DIAGNOSIS — N281 Cyst of kidney, acquired: Secondary | ICD-10-CM

## 2019-10-20 LAB — BLADDER SCAN AMB NON-IMAGING: Scan Result: 220

## 2019-10-21 ENCOUNTER — Other Ambulatory Visit: Payer: Medicare Other

## 2019-10-21 ENCOUNTER — Other Ambulatory Visit: Payer: Self-pay | Admitting: Family Medicine

## 2019-10-21 DIAGNOSIS — N390 Urinary tract infection, site not specified: Secondary | ICD-10-CM

## 2019-10-21 LAB — MICROSCOPIC EXAMINATION
Epithelial Cells (non renal): NONE SEEN /hpf (ref 0–10)
WBC, UA: >30 /hpf — AB (ref 0–5)

## 2019-10-21 LAB — URINALYSIS, COMPLETE
Bilirubin, UA: NEGATIVE
Glucose, UA: NEGATIVE
Ketones, UA: NEGATIVE
Nitrite, UA: NEGATIVE
RBC, UA: NEGATIVE
Specific Gravity, UA: 1.01 (ref 1.005–1.030)
Urobilinogen, Ur: 0.2 mg/dL (ref 0.2–1.0)
pH, UA: >9 — ABNORMAL HIGH (ref 5.0–7.5)

## 2019-10-24 LAB — CULTURE, URINE COMPREHENSIVE

## 2019-10-26 ENCOUNTER — Telehealth: Payer: Self-pay

## 2019-10-26 DIAGNOSIS — R310 Gross hematuria: Secondary | ICD-10-CM

## 2019-10-26 DIAGNOSIS — R3129 Other microscopic hematuria: Secondary | ICD-10-CM

## 2019-10-26 MED ORDER — SULFAMETHOXAZOLE-TRIMETHOPRIM 800-160 MG PO TABS: 1.0000 | ORAL_TABLET | Freq: Two times a day (BID) | ORAL | 0 refills | Status: DC

## 2019-10-26 NOTE — Telephone Encounter (Signed)
-----   Message from Nori Riis, PA-C sent at 10/26/2019  8:06 AM EST ----- Please let Stephanie [name] know that her urine culture was positive for infection and we need to start Septra DS, BID x 7 days.  We then need to recheck her urine in three weeks to ensure the microscopic hematuria has cleared.

## 2019-10-26 NOTE — Telephone Encounter (Signed)
Patient and patient's husband notified script sent to pharmacy and lab apt made, order placed

## 2019-11-16 ENCOUNTER — Other Ambulatory Visit: Payer: Self-pay

## 2019-11-16 ENCOUNTER — Other Ambulatory Visit: Payer: Medicare Other

## 2019-11-16 ENCOUNTER — Telehealth: Payer: Self-pay | Admitting: Urology

## 2019-11-16 DIAGNOSIS — N390 Urinary tract infection, site not specified: Secondary | ICD-10-CM

## 2019-11-16 DIAGNOSIS — R3129 Other microscopic hematuria: Secondary | ICD-10-CM

## 2019-11-16 LAB — MICROSCOPIC EXAMINATION
RBC, Urine: NONE SEEN /hpf (ref 0–2)
WBC, UA: >30 /hpf — AB (ref 0–5)

## 2019-11-16 LAB — URINALYSIS, COMPLETE
Bilirubin, UA: NEGATIVE
Glucose, UA: NEGATIVE
Ketones, UA: NEGATIVE
Nitrite, UA: NEGATIVE
Specific Gravity, UA: >1.03 — ABNORMAL HIGH (ref 1.005–1.030)
Urobilinogen, Ur: 0.2 mg/dL (ref 0.2–1.0)
pH, UA: 5.5 (ref 5.0–7.5)

## 2019-11-16 NOTE — Telephone Encounter (Signed)
Pt would like for Stephanie [name] to give her a call when her urine results are back.  She has some questions for her.

## 2019-11-20 NOTE — Telephone Encounter (Signed)
LMOM for patient to return call.

## 2019-11-20 NOTE — Telephone Encounter (Signed)
Her urine culture results are still in preliminary status.  It is growing out an organism, but as we discussed before she is likely colonized.  If she is not having any burning with urination, blood in her urine, LBP pain, suprapubic pain, fevers, chills, nausea or vomiting, I would not treat the UTI.  In regards to her infusion on Monday, she should call them and seek their advice regarding individuals with colonized urine.

## 2019-11-20 NOTE — Telephone Encounter (Signed)
Pt returned your call and wants you to call her back on her husband's cell# [phone]

## 2019-11-20 NOTE — Telephone Encounter (Signed)
Patient notified and will ask on Monday.

## 2019-11-21 LAB — CULTURE, URINE COMPREHENSIVE

## 2019-11-23 ENCOUNTER — Telehealth: Payer: Self-pay | Admitting: Family Medicine

## 2019-11-23 NOTE — Telephone Encounter (Signed)
Patient notified

## 2019-11-23 NOTE — Telephone Encounter (Signed)
-----   Message from Nori Riis, PA-C sent at 11/22/2019  6:39 PM EST ----- Please let Stephanie [name] know that her final result for her urine culture are in.  I would not recommend treating this culture, as she is likely colonized, unless she is having symptoms of an UTI.

## 2019-12-03 ENCOUNTER — Ambulatory Visit (INDEPENDENT_AMBULATORY_CARE_PROVIDER_SITE_OTHER): Payer: Medicare Other | Admitting: Physician Assistant

## 2019-12-03 ENCOUNTER — Encounter: Payer: Self-pay | Admitting: Physician Assistant

## 2019-12-03 ENCOUNTER — Other Ambulatory Visit: Payer: Self-pay

## 2019-12-03 ENCOUNTER — Telehealth: Payer: Self-pay

## 2019-12-03 VITALS — BP 192/100 | HR 99

## 2019-12-03 DIAGNOSIS — N390 Urinary tract infection, site not specified: Secondary | ICD-10-CM

## 2019-12-03 DIAGNOSIS — N3001 Acute cystitis with hematuria: Secondary | ICD-10-CM | POA: Diagnosis not present

## 2019-12-03 DIAGNOSIS — R339 Retention of urine, unspecified: Secondary | ICD-10-CM

## 2019-12-03 LAB — BLADDER SCAN AMB NON-IMAGING: Scan Result: 0

## 2019-12-03 MED ORDER — CEPHALEXIN 500 MG PO CAPS: 500.0000 mg | ORAL_CAPSULE | Freq: Two times a day (BID) | ORAL | 0 refills | Status: AC

## 2019-12-03 NOTE — Patient Instructions (Signed)
Recurrent UTI Prevention Strategies  1. Start taking an over-the-counter cranberry supplement for urinary tract health. Take this once or twice daily on an empty stomach, e.g. right before bed. 2. Start taking an over-the-counter probiotic, preferably containing lactobacillus. Take this once daily. 3. Continue topical vaginal estrogen cream. Apply a pea-sized amount around the urethra each time you apply it.

## 2019-12-03 NOTE — Telephone Encounter (Signed)
Patient called today stating she is having increased frequency and dysuria and would like an antibiotic for UTI. It was explained that we would need her to have an appointment and UA to evaluate prior to antibiotics given. She was made an appointment today with PA for further evaluation

## 2019-12-03 NOTE — Progress Notes (Signed)
12/03/2019 3:24 PM   Dorotha Brent General [birth year] [id number]  CC: Dysuria, frequency  HPI: KELLI ROBECK is a 79 y.o. female with PMH incomplete bladder emptying, vaginal atrophy, history of keratinizing squamous metaplasia of the bladder, renal lesion, recurrent UTI, and asymptomatic bacteriuria who presents today for evaluation of possible UTI. She is an established [facility] patient who last saw Zara Council on 10/20/2019 for the same.  Urine culture at that visit resulted with Macrobid and tetracycline resistant Proteus mirabilis; she was treated with Bactrim DS twice daily x7 days.  She dropped off a urine sample on 11/16/2019 with reports of foul-smelling urine.  She denied irritative voiding symptoms at that time.  Based on these reports and her known colonized status, treatment was not indicated.  Today, she reports a 2-day history of dysuria, lower abdominal pain, and frequency.  She denies fever, chills, nausea, vomiting, flank pain, and gross hematuria.  She does note some difficulty urinating and decreased urinary output since the onset of her symptoms.    She CICs once daily with an average output between 100-300 mL each time.  She does note some difficulty catheterizing at home.  She uses topical vaginal estrogen cream 1-2 times daily for management of atrophic vaginitis.  She denies a history of constipation.  In-office catheterized UA today positive for 3+ blood, 3+ protein, and 1+ leukocyte esterase; urine microscopy with >30 WBCs/HPF, 11-30 RBCs/HPF, amorphous crystals, and many bacteria.  Bladder scan attempted but unable to obtain a value.  I emptied her bladder today with total output of approximately 150 mL.  PMH: Past Medical History:  Diagnosis Date  . Arthritis   . Asthma   . Cancer ([place]) 1996   colon  . Diabetes mellitus without complication ([place])   . GERD (gastroesophageal reflux disease)   . Glaucoma   . Hyperlipidemia   . Hypertension   . Lumbar disc disease   .  Small bowel obstruction ([place])   . Vitamin B 12 deficiency     Surgical History: Past Surgical History:  Procedure Laterality Date  . ABDOMINAL HYSTERECTOMY    . BACK SURGERY    . COLON SURGERY    . COLONOSCOPY WITH PROPOFOL N/A 05/22/2017   Procedure: COLONOSCOPY WITH PROPOFOL;  Surgeon: Manya Silvas, MD;  Location: [facility] ENDOSCOPY;  Service: Endoscopy;  Laterality: N/A;  . FRACTURE SURGERY    . KNEE ARTHROSCOPY    . LUMBAR LAMINECTOMY      Home Medications:  Allergies as of 12/03/2019      Reactions   Ibuprofen Itching   Indomethacin    Infliximab    Methotrexate Derivatives    Moexipril    Percocet [oxycodone-acetaminophen] Itching   Naprosyn [naproxen] Rash      Medication List       Accurate as of December 03, 2019  3:24 PM. If you have any questions, ask your nurse or doctor.        STOP taking these medications   sulfamethoxazole-trimethoprim 800-160 MG tablet Commonly known as: BACTRIM DS Stopped by: Debroah Loop, PA-C     TAKE these medications   amLODipine 10 MG tablet Commonly known as: NORVASC Take 5 mg by mouth 2 (two) times daily.   brimonidine 0.2 % ophthalmic solution Commonly known as: ALPHAGAN Place 1 drop into both eyes 2 (two) times daily.   CALCIUM 500/D PO Take 1 tablet by mouth daily.   Contour Test test strip Generic drug: glucose blood 1 each by Other route  daily.   folic acid 1 MG tablet Commonly known as: FOLVITE Take 1 mg by mouth daily.   glimepiride 1 MG tablet Commonly known as: AMARYL Take 1 mg by mouth daily.   hydrochlorothiazide 25 MG tablet Commonly known as: HYDRODIURIL Take 25 mg by mouth daily.   leflunomide 10 MG tablet Commonly known as: ARAVA Take 10 mg by mouth daily.   metFORMIN 500 MG 24 hr tablet Commonly known as: GLUCOPHAGE-XR Take 500 mg by mouth 2 (two) times daily.   metoprolol tartrate 50 MG tablet Commonly known as: LOPRESSOR Take 50 mg by mouth 2 (two) times daily.   omeprazole  40 MG capsule Commonly known as: PRILOSEC Take 40 mg by mouth daily.   potassium chloride SA 20 MEQ tablet Commonly known as: KLOR-CON Take 20 mEq by mouth daily.   simvastatin 20 MG tablet Commonly known as: ZOCOR Take 20 mg by mouth daily.   tacrolimus 0.1 % ointment Commonly known as: PROTOPIC Apply 1 application topically 2 (two) times daily as needed (rash).   tamsulosin 0.4 MG Caps capsule Commonly known as: FLOMAX Take one capsule twice daily. What changed:   how much to take  how to take this  when to take this  additional instructions   vitamin B-12 1000 MCG tablet Commonly known as: CYANOCOBALAMIN Take 1,000 mcg by mouth daily.       Allergies:  Allergies  Allergen Reactions  . Ibuprofen Itching  . Indomethacin   . Infliximab   . Methotrexate Derivatives   . Moexipril   . Percocet [Oxycodone-Acetaminophen] Itching  . Naprosyn [Naproxen] Rash    Family History: Family History  Problem Relation Age of Onset  . Breast cancer Neg Hx     Social History:   reports that she has never smoked. She has never used smokeless tobacco. She reports that she does not drink alcohol or use drugs.  Physical Exam: BP (!) 192/100   Pulse 99   Constitutional:  Alert and oriented, no acute distress, nontoxic appearing HEENT: [place], AT Cardiovascular: No clubbing, cyanosis, or edema Respiratory: Normal respiratory effort, no increased work of breathing GU: Normal appearing vaginal introitus and urethral meatus without erythema or discharge.  Difficulty noted with advancing the urinary catheter past the urethral meatus, consistent with pelvic floor tension. Skin: No rashes, bruises or suspicious lesions Neurologic: Grossly intact, no focal deficits, moving all 4 extremities Psychiatric: Normal mood and affect  Laboratory Data: Results for orders placed or performed in visit on 12/03/19  Microscopic Examination   URINE  Result Value Ref Range   WBC, UA >30 (A) 0  - 5 /hpf   RBC 11-30 (A) 0 - 2 /hpf   Epithelial Cells (non renal) 0-10 0 - 10 /hpf   Crystals Present (A) N/A   Crystal Type Amorphous Sediment N/A   Bacteria, UA Many (A) None seen/Few  Urinalysis, Complete  Result Value Ref Range   Specific Gravity, UA >1.030 (H) 1.005 - 1.030   pH, UA 5.5 5.0 - 7.5   Color, UA Yellow Yellow   Appearance Ur Clear Clear   Leukocytes,UA 1+ (A) Negative   Protein,UA 3+ (A) Negative/Trace   Glucose, UA Negative Negative   Ketones, UA Negative Negative   RBC, UA 3+ (A) Negative   Bilirubin, UA Negative Negative   Urobilinogen, Ur 0.2 0.2 - 1.0 mg/dL   Nitrite, UA Negative Negative   Microscopic Examination See below:   BLADDER SCAN AMB NON-IMAGING  Result Value Ref Range  Scan Result 0 ml    Assessment & Plan:   1. Acute cystitis with hematuria 79 year old female with a history of recurrent UTI presents with 2 days of dysuria, frequency, and lower abdominal pain.  Cath UA grossly infected today.  Starting her on empiric Keflex and sending for culture to confirm.  Counseled her that I will contact her via telephone if her culture results indicated a necessary change in therapy.    She will need to return to clinic for repeat UA in approximately 10 days to prove resolution of MH. - Urinalysis, Complete - CULTURE, URINE COMPREHENSIVE - cephALEXin (KEFLEX) 500 MG capsule; Take 1 capsule (500 mg total) by mouth 2 (two) times daily for 7 days.  Dispense: 14 capsule; Refill: 0 - Urinalysis, Complete; Future  2. Recurrent UTI She is already using topical vaginal estrogen cream and denies a history of constipation.  I counseled her to start daily cranberry supplementation and probiotics for UTI prevention. She expressed understanding.  Written instructions also provided today.  3. Incomplete bladder emptying PVR difficult to obtain today but I ultimately catheterized her and emptied approximately 150 mL of urine in clinic.  Counseled her to continue once  daily CIC as long as her output remained stable between 100 300 mL each time.  Explained that an increase in output to 400+ milliliters would indicate that she should start cathing twice daily.  I had difficulty advancing the catheter into her bladder today and suspect some pelvic floor dysfunction as contributory.  Patient also notes some difficulty with catheterization at home.  I offered her a referral to pelvic floor PT today.  I explained that they might be able to evaluate her and provide her with exercises to strengthen versus relax certain muscles to ease urination and catheterization.  She would like to think about this and revisit it at a later date. - BLADDER SCAN AMB NON-IMAGING  Return in 10 days (on 12/13/2019) for UA drop off.  Debroah Loop, PA-C  [facility] [address], [place], [place] [address] [phone]

## 2019-12-04 LAB — MICROSCOPIC EXAMINATION: WBC, UA: >30 /hpf — AB (ref 0–5)

## 2019-12-04 LAB — URINALYSIS, COMPLETE
Bilirubin, UA: NEGATIVE
Glucose, UA: NEGATIVE
Ketones, UA: NEGATIVE
Nitrite, UA: NEGATIVE
Specific Gravity, UA: >1.03 — ABNORMAL HIGH (ref 1.005–1.030)
Urobilinogen, Ur: 0.2 mg/dL (ref 0.2–1.0)
pH, UA: 5.5 (ref 5.0–7.5)

## 2019-12-08 LAB — CULTURE, URINE COMPREHENSIVE

## 2019-12-14 ENCOUNTER — Other Ambulatory Visit: Payer: Self-pay | Admitting: Internal Medicine

## 2019-12-14 ENCOUNTER — Other Ambulatory Visit: Payer: Medicare Other

## 2019-12-14 ENCOUNTER — Other Ambulatory Visit: Payer: Self-pay

## 2019-12-14 DIAGNOSIS — Z1231 Encounter for screening mammogram for malignant neoplasm of breast: Secondary | ICD-10-CM

## 2019-12-14 DIAGNOSIS — N3001 Acute cystitis with hematuria: Secondary | ICD-10-CM

## 2019-12-14 LAB — URINALYSIS, COMPLETE
Bilirubin, UA: NEGATIVE
Ketones, UA: NEGATIVE
Nitrite, UA: POSITIVE — AB
Specific Gravity, UA: 1.02 (ref 1.005–1.030)
Urobilinogen, Ur: 0.2 mg/dL (ref 0.2–1.0)
pH, UA: 6 (ref 5.0–7.5)

## 2019-12-14 LAB — MICROSCOPIC EXAMINATION: WBC, UA: >30 /hpf — AB (ref 0–5)

## 2019-12-15 ENCOUNTER — Telehealth: Payer: Self-pay | Admitting: Physician Assistant

## 2019-12-15 NOTE — Telephone Encounter (Signed)
Repeat UA in office yesterday with improvement, however not resolution, of microscopic hematuria with 3-10 RBCs/hpf.  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  She underwent cystoscopy with no significant findings in January 2020 as well as CTAP with contrast on 07/05/2019 and MR abdomen with and without contrast on 10/16/2019.  Imaging notable for a likely benign hemorrhagic cyst of the upper pole of the right kidney.  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  Recommend deferring further evaluation at this time given recent contrast imaging which is reassuring for malignancy.  Recommend repeat UA at her next scheduled follow-up with Zara Council in 4 months.  If she has persistent MH at that time, may consider repeat imaging for further evaluation.

## 2019-12-15 NOTE — Telephone Encounter (Signed)
Please contact the patient and ask if she was having any urinary symptoms at the time of her urine drop off yesterday.

## 2019-12-15 NOTE — Telephone Encounter (Signed)
Patient states she is doing much better than last week. No burning pain

## 2020-01-24 ENCOUNTER — Encounter: Payer: Self-pay | Admitting: Emergency Medicine

## 2020-01-24 ENCOUNTER — Ambulatory Visit
Admission: EM | Admit: 2020-01-24 | Discharge: 2020-01-24 | Disposition: A | Discharge status: Home / Self Care | Payer: Medicare Other | Attending: Family Medicine | Admitting: Family Medicine

## 2020-01-24 ENCOUNTER — Other Ambulatory Visit: Payer: Self-pay

## 2020-01-24 DIAGNOSIS — N390 Urinary tract infection, site not specified: Secondary | ICD-10-CM | POA: Diagnosis not present

## 2020-01-24 LAB — URINALYSIS, COMPLETE (UACMP) WITH MICROSCOPIC
Bilirubin Urine: NEGATIVE
Glucose, UA: 100 mg/dL — AB
Ketones, ur: NEGATIVE mg/dL
Nitrite: NEGATIVE
Protein, ur: >300 mg/dL — AB
RBC / HPF: >50 RBC/hpf (ref 0–5)
Specific Gravity, Urine: 1.025 (ref 1.005–1.030)
WBC, UA: >50 WBC/hpf (ref 0–5)
pH: 5 (ref 5.0–8.0)

## 2020-01-24 MED ORDER — ONDANSETRON 8 MG PO TBDP: 8.0000 mg | ORAL_TABLET | Freq: Three times a day (TID) | ORAL | 0 refills | Status: DC | PRN

## 2020-01-24 MED ORDER — CEPHALEXIN 500 MG PO CAPS: 500.0000 mg | ORAL_CAPSULE | Freq: Two times a day (BID) | ORAL | 0 refills | Status: DC

## 2020-01-24 NOTE — ED Triage Notes (Signed)
Patient c/o urinary frequency and burning when urinating that started this morning.

## 2020-01-24 NOTE — Discharge Instructions (Signed)
Increase water intake

## 2020-01-24 NOTE — ED Provider Notes (Signed)
[facility]    CSN: [id number] Arrival date & time: 01/24/20  1457      History   Chief Complaint Chief Complaint  Patient presents with  . Urinary Frequency  . Dysuria    HPI Stephanie [name] is a 79 y.o. female.   79 yo female with a c/o frequent urination and mild burning with urination since this morning. Denies any fevers, chills, abdominal pain, flank pain, vomiting, hematuria. However states she feels a little bit nauseated.    Urinary Frequency  Dysuria   Past Medical History:  Diagnosis Date  . Arthritis   . Asthma   . Cancer ([place]) 1996   colon  . Diabetes mellitus without complication ([place])   . GERD (gastroesophageal reflux disease)   . Glaucoma   . Hyperlipidemia   . Hypertension   . Lumbar disc disease   . Small bowel obstruction ([place])   . Vitamin B 12 deficiency     Patient Active Problem List   Diagnosis Date Noted  . Sepsis ([place]) 07/05/2019  . CAP (community acquired pneumonia) 07/05/2019  . Gastroenteritis 07/05/2019  . 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 Elevated serum creatinine 10/16/2017  . Rheumatoid arthritis involving multiple sites with positive rheumatoid factor ([place]) 08/13/2016  . Hyperlipidemia, mixed 04/18/2016  . Gross hematuria 11/01/2015  . History of colon cancer 10/18/2014  . Squamous cell metaplasia of urinary bladder 04/29/2014  . Osteoarthritis of knee 04/01/2014  . B-complex deficiency 03/03/2014  . Benign essential hypertension 01/16/2014  . Unspecified thoracic, thoracolumbar and lumbosacral intervertebral disc disorder 01/16/2014  . Malignant neoplasm of colon ([place]) 01/16/2014  . Bladder neoplasm of uncertain malignant potential 09/14/2013  . Nocturia 08/05/2013  . Scoliosis (and kyphoscoliosis), idiopathic 06/24/2013  . Chronic cystitis 05/04/2013  . Increased frequency of urination 05/04/2013  . Incomplete emptying of bladder 05/04/2013    Past Surgical History:    Procedure Laterality Date  . ABDOMINAL HYSTERECTOMY    . BACK SURGERY    . COLON SURGERY    . COLONOSCOPY WITH PROPOFOL N/A 05/22/2017   Procedure: COLONOSCOPY WITH PROPOFOL;  Surgeon: Manya Silvas, MD;  Location: [facility] ENDOSCOPY;  Service: Endoscopy;  Laterality: N/A;  . FRACTURE SURGERY    . KNEE ARTHROSCOPY    . LUMBAR LAMINECTOMY      OB History   No obstetric history on file.      Home Medications    Prior to Admission medications   Medication Sig Start Date End Date Taking? Authorizing Provider  amLODipine (NORVASC) 10 MG tablet Take 5 mg by mouth 2 (two) times daily.    Yes [provider]  brimonidine (ALPHAGAN) 0.2 % ophthalmic solution Place 1 drop into both eyes 2 (two) times daily.    Yes [provider]  Calcium Carbonate-Vitamin D (CALCIUM 500/D PO) Take 1 tablet by mouth daily.   Yes [provider]  CONTOUR TEST test strip 1 each by Other route daily.  03/09/18  Yes [provider]  folic acid (FOLVITE) 1 MG tablet Take 1 mg by mouth daily.   Yes [provider]  glimepiride (AMARYL) 1 MG tablet Take 1 mg by mouth daily.    Yes [provider]  hydrochlorothiazide (HYDRODIURIL) 25 MG tablet Take 25 mg by mouth daily.   Yes [provider]  leflunomide (ARAVA) 10 MG tablet Take 10 mg by mouth daily.   Yes [provider]  metFORMIN (GLUCOPHAGE-XR) 500 MG 24  hr tablet Take 500 mg by mouth 2 (two) times daily.   Yes [provider]  metoprolol tartrate (LOPRESSOR) 50 MG tablet Take 50 mg by mouth 2 (two) times daily.   Yes [provider]  omeprazole (PRILOSEC) 40 MG capsule Take 40 mg by mouth daily.   Yes [provider]  potassium chloride SA (K-DUR,KLOR-CON) 20 MEQ tablet Take 20 mEq by mouth daily.   Yes [provider]  simvastatin (ZOCOR) 20 MG tablet Take 20 mg by mouth daily.   Yes [provider]  tacrolimus (PROTOPIC) 0.1 % ointment Apply 1  application topically 2 (two) times daily as needed (rash).    Yes [provider]  tamsulosin (FLOMAX) 0.4 MG CAPS capsule Take one capsule twice daily. Patient taking differently: Take 0.4 mg by mouth 2 (two) times daily.  03/31/19  Yes McGowan, Larene [place] A, PA-C  vitamin B-12 (CYANOCOBALAMIN) 1000 MCG tablet Take 1,000 mcg by mouth daily.   Yes [provider]  cephALEXin (KEFLEX) 500 MG capsule Take 1 capsule (500 mg total) by mouth 2 (two) times daily. 01/24/20   Norval Gable, MD  ondansetron (ZOFRAN ODT) 8 MG disintegrating tablet Take 1 tablet (8 mg total) by mouth every 8 (eight) hours as needed. 01/24/20   Norval Gable, MD    Family History Family History  Problem Relation Age of Onset  . Breast cancer Neg Hx     Social History Social History   Tobacco Use  . Smoking status: Never Smoker  . Smokeless tobacco: Never Used  Substance Use Topics  . Alcohol use: No  . Drug use: No     Allergies   Ibuprofen, Indomethacin, Infliximab, Methotrexate derivatives, Moexipril, Percocet [oxycodone-acetaminophen], and Naprosyn [naproxen]   Review of Systems Review of Systems  Genitourinary: Positive for dysuria and frequency.     Physical Exam Triage Vital Signs ED Triage Vitals  Enc Vitals Group     BP 01/24/20 1503 (!) 153/90     Pulse Rate 01/24/20 1503 79     Resp 01/24/20 1503 14     Temp 01/24/20 1503 98.1 F (36.7 C)     Temp Source 01/24/20 1503 Oral     SpO2 01/24/20 1503 96 %     Weight 01/24/20 1500 172 lb (78 kg)     Height 01/24/20 1500 5\' 6"  (1.676 m)     Head Circumference --      Peak Flow --      Pain Score 01/24/20 1459 7     Pain Loc --      Pain Edu? --      Excl. in [place]? --    No data found.  Updated Vital Signs BP (!) 153/90 (BP Location: Left Arm)   Pulse 79   Temp 98.1 F (36.7 C) (Oral)   Resp 14   Ht 5\' 6"  (1.676 m)   Wt 78 kg   SpO2 96%   BMI 27.76 kg/m   Visual Acuity Right Eye Distance:   Left Eye  Distance:   Bilateral Distance:    Right Eye Near:   Left Eye Near:    Bilateral Near:     Physical Exam Vitals and nursing note reviewed.  Constitutional:      General: She is not in acute distress.    Appearance: She is not toxic-appearing or diaphoretic.  Cardiovascular:     Rate and Rhythm: Normal rate.  Pulmonary:     Effort: Pulmonary effort is normal.  Abdominal:     General: There is no distension.  Neurological:     Mental Status: She is alert.      UC Treatments / Results  Labs (all labs ordered are listed, but only abnormal results are displayed) Labs Reviewed  URINALYSIS, COMPLETE (UACMP) WITH MICROSCOPIC - Abnormal; Notable for the following components:      Result Value   APPearance CLOUDY (*)    Glucose, UA 100 (*)    Hgb urine dipstick LARGE (*)    Protein, ur >300 (*)    Leukocytes,Ua SMALL (*)    Bacteria, UA MANY (*)    All other components within normal limits  URINE CULTURE    EKG   Radiology No results found.  Procedures Procedures (including critical care time)  Medications Ordered in UC Medications - No data to display  Initial Impression / Assessment and Plan / UC Course  I have reviewed the triage vital signs and the nursing notes.  Pertinent labs & imaging results that were available during my care of the patient were reviewed by me and considered in my medical decision making (see chart for details).      Final Clinical Impressions(s) / UC Diagnoses   Final diagnoses:  Lower urinary tract infectious disease     Discharge Instructions     Increase water intake    ED Prescriptions    Medication Sig Dispense Auth. Provider   cephALEXin (KEFLEX) 500 MG capsule Take 1 capsule (500 mg total) by mouth 2 (two) times daily. 14 capsule Malissa Slay, MD   ondansetron (ZOFRAN ODT) 8 MG disintegrating tablet Take 1 tablet (8 mg total) by mouth every 8 (eight) hours as needed. 6 tablet Norval Gable, MD      1. Lab  results and diagnosis reviewed with patient 2. rx as per orders above; reviewed possible side effects, interactions, risks and benefits  3. Recommend supportive treatment with increased fluids 4. Follow-up prn if symptoms worsen or don't improve   PDMP not reviewed this encounter.   Norval Gable, MD 01/24/20 [phone]

## 2020-01-25 ENCOUNTER — Telehealth: Payer: Self-pay

## 2020-01-25 NOTE — Telephone Encounter (Signed)
Patient called wanting to let you know that she was seen at an urgent care for UTI symptoms.  She was put on keflex ( discharge in chart) and states UA was sent culture. Patient did not want to schedule a follow up.

## 2020-01-27 LAB — URINE CULTURE
Culture: >=100000 — AB
Special Requests: NORMAL

## 2020-02-27 ENCOUNTER — Other Ambulatory Visit: Payer: Self-pay | Admitting: Urology

## 2020-03-04 ENCOUNTER — Encounter (INDEPENDENT_AMBULATORY_CARE_PROVIDER_SITE_OTHER): Payer: Self-pay

## 2020-03-04 ENCOUNTER — Other Ambulatory Visit: Payer: Self-pay

## 2020-03-04 ENCOUNTER — Ambulatory Visit (INDEPENDENT_AMBULATORY_CARE_PROVIDER_SITE_OTHER): Payer: Medicare Other | Admitting: Physician Assistant

## 2020-03-04 ENCOUNTER — Encounter: Payer: Self-pay | Admitting: Physician Assistant

## 2020-03-04 VITALS — BP 170/94 | HR 84 | Ht 66.0 in | Wt 172.0 lb

## 2020-03-04 DIAGNOSIS — N390 Urinary tract infection, site not specified: Secondary | ICD-10-CM | POA: Diagnosis not present

## 2020-03-04 DIAGNOSIS — R310 Gross hematuria: Secondary | ICD-10-CM

## 2020-03-04 LAB — URINALYSIS, COMPLETE
Bilirubin, UA: NEGATIVE
Ketones, UA: NEGATIVE
Leukocytes,UA: NEGATIVE
Nitrite, UA: NEGATIVE
Protein,UA: NEGATIVE
Specific Gravity, UA: >1.03 — ABNORMAL HIGH (ref 1.005–1.030)
Urobilinogen, Ur: 0.2 mg/dL (ref 0.2–1.0)
pH, UA: 6 (ref 5.0–7.5)

## 2020-03-04 LAB — MICROSCOPIC EXAMINATION
RBC, Urine: >30 /hpf — AB (ref 0–2)
WBC, UA: >30 /hpf — AB (ref 0–5)

## 2020-03-04 MED ORDER — SULFAMETHOXAZOLE-TRIMETHOPRIM 800-160 MG PO TABS: 1.0000 | ORAL_TABLET | Freq: Two times a day (BID) | ORAL | 0 refills | Status: AC

## 2020-03-04 MED ORDER — ESTRADIOL 0.1 MG/GM VA CREA: TOPICAL_CREAM | VAGINAL | 5 refills | Status: DC

## 2020-03-04 NOTE — Progress Notes (Signed)
03/04/2020 3:56 PM   Amberli Brent General 05-01-1941 [id number]  CC: Chief Complaint  Patient presents with  . Acute Visit    UTI symptoms    HPI: JAMARIYAH JOHANNSEN is a 79 y.o. female with PMH incomplete bladder emptying, vaginal atrophy, history of keratinizing squamous metaplasia of the bladder, renal lesion, recurrent UTI, and asymptomatic bacteriuria who presents today for evaluation of possible UTI.  Today she reports a 2-day history of dysuria, frequency, and possible gross hematuria. She has been using topical Vagisil and Estrace cream for symptom palliation, however these are of limited therapeutic effect.  Patient takes daily cranberry supplements with dinner and a daily lactobacillus containing probiotic for UTI prevention. She reports she is out of Estrace cream, however she only uses this on an as-needed basis.  In-office catheterized UA today positive for 2+ blood; urine microscopy with >30 WBCs/HPF, >30 RBCs/HPF, and many bacteria.  PMH: Past Medical History:  Diagnosis Date  . Arthritis   . Asthma   . Cancer ([place]) 1996   colon  . Diabetes mellitus without complication ([place])   . GERD (gastroesophageal reflux disease)   . Glaucoma   . Hyperlipidemia   . Hypertension   . Lumbar disc disease   . Small bowel obstruction ([place])   . Vitamin B 12 deficiency     Surgical History: Past Surgical History:  Procedure Laterality Date  . ABDOMINAL HYSTERECTOMY    . BACK SURGERY    . COLON SURGERY    . COLONOSCOPY WITH PROPOFOL N/A 05/22/2017   Procedure: COLONOSCOPY WITH PROPOFOL;  Surgeon: Manya Silvas, MD;  Location: [facility] ENDOSCOPY;  Service: Endoscopy;  Laterality: N/A;  . FRACTURE SURGERY    . KNEE ARTHROSCOPY    . LUMBAR LAMINECTOMY      Home Medications:  Allergies as of 03/04/2020      Reactions   Ibuprofen Itching   Indomethacin    Infliximab    Methotrexate Derivatives    Moexipril    Percocet [oxycodone-acetaminophen] Itching   Naprosyn [naproxen] Rash        Medication List       Accurate as of March 04, 2020  3:56 PM. If you have any questions, ask your nurse or doctor.        STOP taking these medications   cephALEXin 500 MG capsule Commonly known as: KEFLEX Stopped by: Debroah Loop, PA-C   ondansetron 8 MG disintegrating tablet Commonly known as: Zofran ODT Stopped by: Debroah Loop, PA-C     TAKE these medications   amLODipine 10 MG tablet Commonly known as: NORVASC Take 5 mg by mouth 2 (two) times daily.   brimonidine 0.2 % ophthalmic solution Commonly known as: ALPHAGAN Place 1 drop into both eyes 2 (two) times daily.   CALCIUM 500/D PO Take 1 tablet by mouth daily.   Contour Test test strip Generic drug: glucose blood 1 each by Other route daily.   estradiol 0.1 MG/GM vaginal cream Commonly known as: ESTRACE Apply a pea-sized amount around the urethra three times weekly for UTI prevention. Started by: Debroah Loop, PA-C   folic acid 1 MG tablet Commonly known as: FOLVITE Take 1 mg by mouth daily.   glimepiride 1 MG tablet Commonly known as: AMARYL Take 1 mg by mouth daily.   hydrochlorothiazide 25 MG tablet Commonly known as: HYDRODIURIL Take 25 mg by mouth daily.   leflunomide 10 MG tablet Commonly known as: ARAVA Take 10 mg by mouth daily.   metFORMIN 500  MG 24 hr tablet Commonly known as: GLUCOPHAGE-XR Take 500 mg by mouth 2 (two) times daily.   metoprolol tartrate 50 MG tablet Commonly known as: LOPRESSOR Take 50 mg by mouth 2 (two) times daily.   omeprazole 40 MG capsule Commonly known as: PRILOSEC Take 40 mg by mouth daily.   potassium chloride SA 20 MEQ tablet Commonly known as: KLOR-CON Take 20 mEq by mouth daily.   simvastatin 20 MG tablet Commonly known as: ZOCOR Take 20 mg by mouth daily.   sulfamethoxazole-trimethoprim 800-160 MG tablet Commonly known as: BACTRIM DS Take 1 tablet by mouth 2 (two) times daily for 5 days. Started by: Debroah Loop, PA-C   tacrolimus 0.1 % ointment Commonly known as: PROTOPIC Apply 1 application topically 2 (two) times daily as needed (rash).   tamsulosin 0.4 MG Caps capsule Commonly known as: FLOMAX TAKE 1 CAPSULE(0.4 MG) BY MOUTH DAILY   vitamin B-12 1000 MCG tablet Commonly known as: CYANOCOBALAMIN Take 1,000 mcg by mouth daily.       Allergies:  Allergies  Allergen Reactions  . Ibuprofen Itching  . Indomethacin   . Infliximab   . Methotrexate Derivatives   . Moexipril   . Percocet [Oxycodone-Acetaminophen] Itching  . Naprosyn [Naproxen] Rash    Family History: Family History  Problem Relation Age of Onset  . Breast cancer Neg Hx     Social History:   reports that she has never smoked. She has never used smokeless tobacco. She reports that she does not drink alcohol or use drugs.  Physical Exam: BP (!) 170/94   Pulse 84   Ht 5\' 6"  (1.676 m)   Wt 172 lb (78 kg)   BMI 27.76 kg/m   Constitutional:  Alert and oriented, no acute distress, nontoxic appearing HEENT: [place], AT Cardiovascular: No clubbing, cyanosis, or edema Respiratory: Normal respiratory effort, no increased work of breathing Skin: No rashes, bruises or suspicious lesions Neurologic: Grossly intact, no focal deficits, moving all 4 extremities Psychiatric: Normal mood and affect  Laboratory Data: Results for orders placed or performed in visit on 03/04/20  CULTURE, URINE COMPREHENSIVE   Specimen: Urine   UR  Result Value Ref Range   Urine Culture, Comprehensive Preliminary report (A)    Organism ID, Bacteria Gram negative rods (A)   Microscopic Examination   URINE  Result Value Ref Range   WBC, UA >30 (A) 0 - 5 /hpf   RBC >30 (A) 0 - 2 /hpf   Epithelial Cells (non renal) 0-10 0 - 10 /hpf   Bacteria, UA Many (A) None seen/Few  Urinalysis, Complete  Result Value Ref Range   Specific Gravity, UA >1.030 (H) 1.005 - 1.030   pH, UA 6.0 5.0 - 7.5   Color, UA Yellow Yellow   Appearance Ur  Cloudy (A) Clear   Leukocytes,UA Negative Negative   Protein,UA Negative Negative/Trace   Glucose, UA 2+ (A) Negative   Ketones, UA Negative Negative   RBC, UA 2+ (A) Negative   Bilirubin, UA Negative Negative   Urobilinogen, Ur 0.2 0.2 - 1.0 mg/dL   Nitrite, UA Negative Negative   Microscopic Examination See below:    Assessment & Plan:   1. Recurrent UTI 2-day history of dysuria and frequency consistent with acute cystitis, will start patient on empiric Bactrim and send urine for culture for further evaluation.  I counseled patient to switch her daily cranberry supplement to right before bed on an empty stomach and continue daily probiotic. Additionally, and refilling  Estrace cream and counseled her to start using this 3 times weekly indefinitely for UTI prevention. Explained that topical vaginal estrogen cream plays an important role in UTI prevention in postmenopausal women. She expressed understanding. Additionally, 2 tubes of Premarin samples provided today. - Urinalysis, Complete - sulfamethoxazole-trimethoprim (BACTRIM DS) 800-160 MG tablet; Take 1 tablet by mouth 2 (two) times daily for 5 days.  Dispense: 10 tablet; Refill: 0 - CULTURE, URINE COMPREHENSIVE - estradiol (ESTRACE) 0.1 MG/GM vaginal cream; Apply a pea-sized amount around the urethra three times weekly for UTI prevention.  Dispense: 42.5 g; Refill: 5  2. Hematuria, gross Hematuria worse today on UA compared with prior. Likely infectious in origin. She is scheduled for follow-up in clinic in less than 1 month, recommend repeat UA at that time. Notably, however, patient has undergone cystoscopy in January 2020, CTAP with contrast in October 2020, and MR abdomen with and without contrast in January 2021. Okay to defer hematuria work-up in light of this recent imaging.  Return if symptoms worsen or fail to improve.  Debroah Loop, PA-C  [facility] [address] [place],  [address] [phone]

## 2020-03-04 NOTE — Patient Instructions (Signed)
Recurrent UTI Prevention Strategies  1. Continue your over-the-counter cranberry supplement for urinary tract health. Take this once or twice daily on an empty stomach, e.g. right before bed. 2. Continue taking an over-the-counter probiotic, preferably containing lactobacillus. Take this daily. 3. Start vaginal estrogen cream. Apply a pea-sized amount around the urethra three times weekly.

## 2020-03-11 ENCOUNTER — Telehealth: Payer: Self-pay | Admitting: Physician Assistant

## 2020-03-11 LAB — CULTURE, URINE COMPREHENSIVE

## 2020-03-11 MED ORDER — NITROFURANTOIN MONOHYD MACRO 100 MG PO CAPS: 100.0000 mg | ORAL_CAPSULE | Freq: Two times a day (BID) | ORAL | 0 refills | Status: AC

## 2020-03-11 NOTE — Telephone Encounter (Signed)
[facility] notifying patient of message below.

## 2020-03-11 NOTE — Telephone Encounter (Signed)
Please contact the patient and inform her that her urine culture has come back resistant to Bactrim.  I would like to start her on Macrobid 100 mg twice daily x5 days instead.  I have sent this medication to the [facility] in [place].  She should start this ASAP.

## 2020-03-18 ENCOUNTER — Ambulatory Visit
Admission: RE | Admit: 2020-03-18 | Discharge: 2020-03-18 | Disposition: A | Discharge status: Home / Self Care | Payer: Medicare Other | Source: Ambulatory Visit | Attending: Internal Medicine | Admitting: Internal Medicine

## 2020-03-18 DIAGNOSIS — Z1231 Encounter for screening mammogram for malignant neoplasm of breast: Secondary | ICD-10-CM

## 2020-03-30 NOTE — Progress Notes (Signed)
03/31/2020 9:38 AM   Stephanie [name] 1941-04-17 [id number]  Referring provider: Rusty Aus, MD [facility] [place],   [id number]  Chief Complaint  Patient presents with  . Recurrent UTI    HPI: Stephanie [name] is a 79 y.o. female with incomplete bladder emptying, vaginal atrophy and rUTI's who presents with her husband, Stephanie [name] for follow up.    Incomplete bladder emptying PVR 443 mL.   She is catheterizing herself once daily.  She does this right before she goes to bed and is having PVRs of approximately 350 cc.  She states that she sleeps through the night, but then she wakes up and has to urinate several times in the morning.  This urgency wanes during the day.  She is taking Flomax 0.4 mg daily   Vaginal atrophy Applying vaginal estrogen cream 3 nights weekly  rUTI's Reviewing her records: E. coli resistant to ampicillin, ampicillin/sulbactam on 07/05/2019 Staphylococcus epidermidis resistant to ciprofloxacin, penicillin and tetracycline on July 16, 2019 Proteus Mirabella's resistant to nitrofurantoin and tetracycline on October 21, 2019 E. coli resistant to ampicillin, cefazolin, tetracycline and trimethoprim/sulfa on November 16, 2019 Klebsiella pneumoniae resistant to ampicillin on December 03, 2019 Klebsiella pneumonia resistant to ampicillin nitrofurantoin on January 24, 2020 E. coli resistant to ciprofloxacin, levofloxacin, tetracycline on March 04, 2020  Risk factors: age, postmenopausal, incomplete bladder emptying and limited fluid intake  Her symptoms with a urinary tract infection consist of dysuria, frequency and gross hematuria.  She is postmenopausal.  She denies constipation and/or diarrhea.   She does engage in good perineal hygiene. She does not take tub baths.  She is taking cranberry tablets and probiotics.  She does readily admit she does not drink enough water.  Today, she is asymptomatic.  She and her  husband complain of malodorous urine.  She wears incontinent pads in case she leaks, but she is not having leakage.  Patient denies any modifying or aggravating factors.  Patient denies any gross hematuria, dysuria or suprapubic/flank pain.  Patient denies any fevers, chills, nausea or vomiting.  CATH UA today > 30 WBC's and many bacteria.  PVR is 400 cc.  PMH: Past Medical History:  Diagnosis Date  . Arthritis   . Asthma   . Cancer ([place]) 1996   colon  . Diabetes mellitus without complication ([place])   . GERD (gastroesophageal reflux disease)   . Glaucoma   . Hyperlipidemia   . Hypertension   . Lumbar disc disease   . Small bowel obstruction ([place])   . Vitamin B 12 deficiency     Surgical History: Past Surgical History:  Procedure Laterality Date  . ABDOMINAL HYSTERECTOMY    . BACK SURGERY    . COLON SURGERY    . COLONOSCOPY WITH PROPOFOL N/A 05/22/2017   Procedure: COLONOSCOPY WITH PROPOFOL;  Surgeon: Manya Silvas, MD;  Location: [facility] ENDOSCOPY;  Service: Endoscopy;  Laterality: N/A;  . FRACTURE SURGERY    . KNEE ARTHROSCOPY    . LUMBAR LAMINECTOMY      Home Medications:  Allergies as of 03/31/2020      Reactions   Ibuprofen Itching   Indomethacin    Infliximab    Methotrexate Derivatives    Moexipril    Percocet [oxycodone-acetaminophen] Itching   Naprosyn [naproxen] Rash      Medication List       Accurate as of March 31, 2020  9:38 AM. If you have any questions, ask  your nurse or doctor.        amLODipine 10 MG tablet Commonly known as: NORVASC Take 5 mg by mouth 2 (two) times daily.   brimonidine 0.2 % ophthalmic solution Commonly known as: ALPHAGAN Place 1 drop into both eyes 2 (two) times daily.   CALCIUM 500/D PO Take 1 tablet by mouth daily.   Contour Test test strip Generic drug: glucose blood 1 each by Other route daily.   estradiol 0.1 MG/GM vaginal cream Commonly known as: ESTRACE Apply a pea-sized amount around the urethra three times  weekly for UTI prevention.   folic acid 1 MG tablet Commonly known as: FOLVITE Take 1 mg by mouth daily.   glimepiride 1 MG tablet Commonly known as: AMARYL Take 1 mg by mouth daily.   hydrochlorothiazide 25 MG tablet Commonly known as: HYDRODIURIL Take 25 mg by mouth daily.   leflunomide 10 MG tablet Commonly known as: ARAVA Take 10 mg by mouth daily.   metFORMIN 500 MG 24 hr tablet Commonly known as: GLUCOPHAGE-XR Take 500 mg by mouth 2 (two) times daily.   metoprolol tartrate 50 MG tablet Commonly known as: LOPRESSOR Take 50 mg by mouth 2 (two) times daily.   omeprazole 40 MG capsule Commonly known as: PRILOSEC Take 40 mg by mouth daily.   potassium chloride SA 20 MEQ tablet Commonly known as: KLOR-CON Take 20 mEq by mouth daily.   simvastatin 20 MG tablet Commonly known as: ZOCOR Take 20 mg by mouth daily.   tacrolimus 0.1 % ointment Commonly known as: PROTOPIC Apply 1 application topically 2 (two) times daily as needed (rash).   tamsulosin 0.4 MG Caps capsule Commonly known as: FLOMAX TAKE 1 CAPSULE(0.4 MG) BY MOUTH DAILY   vitamin B-12 1000 MCG tablet Commonly known as: CYANOCOBALAMIN Take 1,000 mcg by mouth daily.       Allergies:  Allergies  Allergen Reactions  . Ibuprofen Itching  . Indomethacin   . Infliximab   . Methotrexate Derivatives   . Moexipril   . Percocet [Oxycodone-Acetaminophen] Itching  . Naprosyn [Naproxen] Rash    Family History: Family History  Problem Relation Age of Onset  . Breast cancer Neg Hx     Social History:  reports that she has never smoked. She has never used smokeless tobacco. She reports that she does not drink alcohol and does not use drugs.  ROS: For pertinent review of systems please refer to history of present illness  Physical Exam: BP (!) 161/89   Pulse 82   Ht '5\' 6"'$  (1.676 m)   Wt 172 lb (78 kg)   BMI 27.76 kg/m   Constitutional:  Well nourished. Alert and oriented, No acute  distress. HEENT: [place] AT, mask in place.  Trachea midline Cardiovascular: No clubbing, cyanosis, or edema. Respiratory: Normal respiratory effort, no increased work of breathing. GI: Abdomen is soft, non tender, non distended, no abdominal masses. Liver and spleen not palpable.  No hernias appreciated.  Stool sample for occult testing is not indicated.   GU: No CVA tenderness.  No bladder fullness or masses.  Atrophic external genitalia, sparse pubic hair distribution, no lesions.  Normal urethral meatus, no lesions, no prolapse, no discharge.   No urethral masses, tenderness and/or tenderness. No bladder fullness, tenderness or masses. Pale vagina mucosa, fair estrogen effect, no discharge, no lesions.   Large external hemorrhoids noted Skin: No rashes, bruises or suspicious lesions. Lymph: No inguinal adenopathy. Neurologic: Grossly intact, no focal deficits, moving all 4 extremities. Psychiatric:  Normal mood and affect.   Laboratory Data: Urinalysis Component     Latest Ref Rng & Units 03/31/2020  Specific Gravity, UA     1.005 - 1.030 1.020  pH, UA     5.0 - 7.5 5.0  Color, UA     Yellow Yellow  Appearance Ur     Clear Cloudy (A)  Leukocytes,UA     Negative Trace (A)  Protein,UA     Negative/Trace 2+ (A)  Glucose, UA     Negative Trace (A)  Ketones, UA     Negative Negative  RBC, UA     Negative Trace (A)  Bilirubin, UA     Negative Negative  Urobilinogen, Ur     0.2 - 1.0 mg/dL 0.2  Nitrite, UA     Negative Negative  Microscopic Examination      See below:   Component     Latest Ref Rng & Units 03/31/2020          WBC, UA     0 - 5 /hpf >30 (A)  RBC     0 - 2 /hpf 0-2  Epithelial Cells (non renal)     0 - 10 /hpf 0-10  Renal Epithel, UA     None seen /hpf 0-10 (A)  Bacteria, UA     None seen/Few Many (A)   Lab Results  Component Value Date   WBC 5.9 07/08/2019   HGB 11.9 (L) 07/08/2019   HCT 37.2 07/08/2019   MCV 85.7 07/08/2019   PLT 198 07/08/2019     Lab Results  Component Value Date   CREATININE 0.90 10/16/2019    Lab Results  Component Value Date   HGBA1C 7.7 (H) 07/05/2019    Lab Results  Component Value Date   AST 23 07/05/2019   Lab Results  Component Value Date   ALT 19 07/05/2019   I have reviewed the labs.  Pertinent Imaging Results for SATSUKI, ZILLMER (MRN [id number]) as of 03/31/2020 09:32  Ref. Range 03/31/2020 08:58  Scan Result Unknown 443 ML   Procedure In and Out Catheterization Patient is present today for a I & O catheterization due to incomplete bladder emptying. Patient was cleaned and prepped in a sterile fashion with betadine . A 14 FR cath was inserted no complications were noted , 440 ml of urine return was noted, urine was yellow clear in color. A clean urine sample was collected for UA.  Bladder was drained  And catheter was removed with out difficulty.    Performed by: Nori Riis, PA-C   Assessment & Plan:    1.  Incomplete bladder emptying Patient will continue tamsulosin 0.8 mg daily Will increase caths to twice daily Patient caths two times daily, indefinitely due to urinary retention   2. Vaginal atrophy Continue applying the vaginal estrogen cream 3 nights weekly Patient does not need a refill at this time  3. rUTI's Criteria for recurrent UTI has been met with 2 or more infections in 6 months or 3 or greater infections in one year  Patient is instructed to increase their water intake until the urine is pale yellow or clear (10 to 12 cups daily)  Patient is instructed to continue probiotics (yogurt, oral pills or vaginal suppositories), take cranberry pills or drink the juice and Vitamin C 1,000 mg daily to acidify the urine  Avoid soaking in tubs and wipe front to back after urinating Advised them to have CATH UA's for urinalysis and  culture to prevent skin, vaginal and/or rectal contamination of the specimen Reviewed symptoms of UTI (fevers, chills, gross hematuria, mental  status changes, dysuria, suprapubic pain, back pain and/or sudden worsening of urinary symptoms) and advised not to have urine checked or be treated for UTI if not experiencing symptoms Discussed antibiotic stewardship with the patient - explained the risk of increasing risk of antibiotic resistance with continuous exposure to antibiotics, renal failure, hypoglycemia, C. Diff infection, allergic reactions, etc.   Also suggested to stop using incontinence pads as this may contribute to infections as well  Will not need to send urine for culture as she is asymptomatic at this visit  4. Malodorous urine Explained that the bad odor from the urine is the result of her incomplete emptying and we will increase her self cathing to twice daily and this should improve and it is not a sign of bladder infection                                   Return in about 1 month (around 05/01/2020) for PVR and office visit .  These notes generated with voice recognition software. I apologize for typographical errors.  Zara Council, PA-C  [facility] [address] [place], [place] [address] [phone]  I spent 30 minutes on the day of the encounter to include pre-visit record review, face-to-face time with the patient, and post-visit ordering of tests.

## 2020-03-31 ENCOUNTER — Other Ambulatory Visit: Payer: Self-pay

## 2020-03-31 ENCOUNTER — Ambulatory Visit (INDEPENDENT_AMBULATORY_CARE_PROVIDER_SITE_OTHER): Payer: Medicare Other | Admitting: Urology

## 2020-03-31 ENCOUNTER — Encounter: Payer: Self-pay | Admitting: Urology

## 2020-03-31 VITALS — BP 161/89 | HR 82 | Ht 66.0 in | Wt 172.0 lb

## 2020-03-31 DIAGNOSIS — N390 Urinary tract infection, site not specified: Secondary | ICD-10-CM

## 2020-03-31 DIAGNOSIS — N952 Postmenopausal atrophic vaginitis: Secondary | ICD-10-CM | POA: Diagnosis not present

## 2020-03-31 DIAGNOSIS — R8290 Unspecified abnormal findings in urine: Secondary | ICD-10-CM

## 2020-03-31 DIAGNOSIS — R339 Retention of urine, unspecified: Secondary | ICD-10-CM | POA: Diagnosis not present

## 2020-03-31 LAB — URINALYSIS, COMPLETE
Bilirubin, UA: NEGATIVE
Ketones, UA: NEGATIVE
Nitrite, UA: NEGATIVE
Specific Gravity, UA: 1.02 (ref 1.005–1.030)
Urobilinogen, Ur: 0.2 mg/dL (ref 0.2–1.0)
pH, UA: 5 (ref 5.0–7.5)

## 2020-03-31 LAB — BLADDER SCAN AMB NON-IMAGING: Scan Result: 443

## 2020-03-31 LAB — MICROSCOPIC EXAMINATION: WBC, UA: >30 /hpf — AB (ref 0–5)

## 2020-04-19 ENCOUNTER — Ambulatory Visit
Admission: RE | Admit: 2020-04-19 | Discharge: 2020-04-19 | Disposition: A | Discharge status: Home / Self Care | Payer: Medicare Other | Source: Ambulatory Visit | Attending: Student in an Organized Health Care Education/Training Program | Admitting: Student in an Organized Health Care Education/Training Program

## 2020-04-19 ENCOUNTER — Ambulatory Visit (HOSPITAL_BASED_OUTPATIENT_CLINIC_OR_DEPARTMENT_OTHER): Payer: Medicare Other | Admitting: Student in an Organized Health Care Education/Training Program

## 2020-04-19 ENCOUNTER — Encounter: Payer: Self-pay | Admitting: Student in an Organized Health Care Education/Training Program

## 2020-04-19 ENCOUNTER — Other Ambulatory Visit: Payer: Self-pay

## 2020-04-19 VITALS — BP 146/81 | HR 71 | Temp 97.3°F | Resp 17 | Ht 66.0 in | Wt 170.0 lb

## 2020-04-19 DIAGNOSIS — M533 Sacrococcygeal disorders, not elsewhere classified: Secondary | ICD-10-CM | POA: Insufficient documentation

## 2020-04-19 DIAGNOSIS — M5416 Radiculopathy, lumbar region: Secondary | ICD-10-CM

## 2020-04-19 DIAGNOSIS — G8929 Other chronic pain: Secondary | ICD-10-CM

## 2020-04-19 DIAGNOSIS — G894 Chronic pain syndrome: Secondary | ICD-10-CM | POA: Insufficient documentation

## 2020-04-19 DIAGNOSIS — Z981 Arthrodesis status: Secondary | ICD-10-CM

## 2020-04-19 DIAGNOSIS — M47816 Spondylosis without myelopathy or radiculopathy, lumbar region: Secondary | ICD-10-CM

## 2020-04-19 MED ORDER — TIZANIDINE HCL 4 MG PO TABS: 2.0000 mg | ORAL_TABLET | Freq: Two times a day (BID) | ORAL | 0 refills | Status: AC | PRN

## 2020-04-19 NOTE — Progress Notes (Signed)
Safety precautions to be maintained throughout the outpatient stay will include: orient to surroundings, keep bed in low position, maintain call bell within reach at all times, provide assistance with transfer out of bed and ambulation.  

## 2020-04-19 NOTE — Patient Instructions (Signed)
Preparing for your procedure (without sedation) Instructions: . Oral Intake: Do not eat or drink anything for at least 3 hours prior to your procedure. . Transportation: Unless otherwise stated by your physician, you may drive yourself after the procedure. . Blood Pressure Medicine: Take your blood pressure medicine with a sip of water the morning of the procedure. . Insulin: Take only  of your normal insulin dose. . Preventing infections: Shower with an antibacterial soap the morning of your procedure. . Build-up your immune system: Take 1000 mg of Vitamin C with every meal (3 times a day) the day prior to your procedure. . Pregnancy: If you are pregnant, call and cancel the procedure. . Sickness: If you have a cold, fever, or any active infections, call and cancel the procedure. . Arrival: You must be in the facility at least 30 minutes prior to your scheduled procedure. . Children: Do not bring any children with you. . Dress appropriately: Bring dark clothing that you would not mind if they get stained. . Valuables: Do not bring any jewelry or valuables. Procedure appointments are reserved for interventional treatments only. [facility] No Prescription Refills. . No medication changes will be discussed during procedure appointments. . No disability issues will be discussed.    TAKE BLOOD PRESSURE MEDICINE WITH A SMALL SIP OF WATER.  WAIT TO TAKE METFORMIN UNTIL AFTER YOU GET HOME. DO NOT EAT OR DRINK FOR 3 HOURS PRIOR TO PROCEDURE

## 2020-04-19 NOTE — Progress Notes (Signed)
Patient: Stephanie [name]  Service Category: E/M  Provider: Gillis Santa, MD  DOB: 12-15-1940  DOS: 04/19/2020  Referring Provider: Lonell Face, NP  MRN: [id number]  Setting: Ambulatory outpatient  PCP: Rusty Aus, MD  Type: New Patient  Specialty: Interventional Pain Management    Location: Office  Delivery: Face-to-face     Primary Reason(s) for Visit: Encounter for initial evaluation of one or more chronic problems (new to examiner) potentially causing chronic pain, and posing a threat to normal musculoskeletal function. (Level of risk: High) CC: Hip Pain (left)  HPI  Stephanie [name] is a 79 y.o. year old, female patient, who comes today to see [place] for the first time for an initial evaluation of her chronic pain. She has B-complex deficiency; Benign essential hypertension; Bladder neoplasm of uncertain malignant potential; Chronic cystitis; DM type 2 with diabetic mixed hyperlipidemia ([place]); Sepsis ([place]); CAP (community acquired pneumonia); Gastroenteritis; Unspecified thoracic, thoracolumbar and lumbosacral intervertebral disc disorder; Squamous cell metaplasia of urinary bladder; Scoliosis (and kyphoscoliosis), idiopathic; Rheumatoid arthritis involving multiple sites with positive rheumatoid factor ([place]); Osteoarthritis of knee; Nocturia; Malignant neoplasm of colon ([place]); Increased frequency of urination; Incomplete emptying of bladder; Hyperlipidemia, mixed; History of colon cancer; Gross hematuria; Elevated serum creatinine; SI (sacroiliac) joint dysfunction; 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T9-Sacrum); Lumbar radiculopathy; Lumbar spondylosis; and Chronic pain syndrome on their problem list. Today she comes in for evaluation of her Hip Pain (left)  Pain Assessment: Location: Left Hip Radiating: radiates into left groin Onset: More than a month ago Duration: Chronic pain Quality: Aching, Sharp Severity: 6 /10 (subjective, self-reported pain score)  Note: Reported level is compatible with  observation.                         When using our objective Pain Scale, levels between 6 and 10/10 are said to belong in an emergency room, as it progressively worsens from a 6/10, described as severely limiting, requiring emergency care not usually available at an outpatient pain management facility. At a 6/10 level, communication becomes difficult and requires great effort. Assistance to reach the emergency department may be required. Facial flushing and profuse sweating along with potentially dangerous increases in heart rate and blood pressure will be evident. Effect on ADL: limits activities Timing: Constant Modifying factors: rest, lying down BP: (!) 146/81  HR: 71  Onset and Duration: Present longer than 3 months Cause of pain: Unknown Severity: No change since onset and NAS-11 now: 5/10 Timing: Not influenced by the time of the day Aggravating Factors: Prolonged sitting and Walking Alleviating Factors: Lying down, Resting and Sleeping Associated Problems: Day-time cramps, Night-time cramps, Spasms and Weakness Quality of Pain: Aching, Sharp and Uncomfortable Previous Examinations or Tests: X-rays and Orthopedic evaluation Previous Treatments: Physical Therapy  The patient comes into the clinics today for the first time for a chronic pain management evaluation.   Stephanie [name] is a pleasant 79 year old female who is accompanied today by her husband who presents with a chief complaint of left hip pain that radiates into her left groin.  This is been present for approximately 3 months.  No inciting or traumatic event however patient does recall 1 day where she may have twisted her foot that could have resulted in the discomfort that she is feeling in her left hip at this time.  Of note, patient has a significant thoracolumbar spinal fusion from T9 to sacrum.  She works with physical therapy which she states has  helped with her strength and range of motion.  She has seen orthopedics for this  condition where she had a left greater trochanteric bursa injection done that was somewhat helpful but did not help out as much with her groin and hip pain.  The majority of her pain is overlying her SI joint with radiation to her anterior pelvis and into her groin.  She is on multiple medications as below.  She has not tried a muscle relaxer in many years.   Meds   Current Outpatient Medications:  .  amLODipine (NORVASC) 10 MG tablet, Take 5 mg by mouth 2 (two) times daily. , Disp: , Rfl:  .  CONTOUR TEST test strip, 1 each by Other route daily. , Disp: , Rfl: 5 .  Cranberry 400 MG CAPS, Take by mouth., Disp: , Rfl:  .  glipiZIDE (GLUCOTROL) 10 MG tablet, Take 10 mg by mouth daily before breakfast., Disp: , Rfl:  .  hydrochlorothiazide (HYDRODIURIL) 25 MG tablet, Take 25 mg by mouth daily., Disp: , Rfl:  .  leflunomide (ARAVA) 10 MG tablet, Take 10 mg by mouth daily., Disp: , Rfl:  .  metFORMIN (GLUCOPHAGE-XR) 500 MG 24 hr tablet, Take 500 mg by mouth 2 (two) times daily., Disp: , Rfl:  .  metoprolol tartrate (LOPRESSOR) 50 MG tablet, Take 50 mg by mouth 2 (two) times daily., Disp: , Rfl:  .  omeprazole (PRILOSEC) 40 MG capsule, Take 40 mg by mouth daily., Disp: , Rfl:  .  potassium chloride SA (K-DUR,KLOR-CON) 20 MEQ tablet, Take 20 mEq by mouth daily., Disp: , Rfl:  .  rosuvastatin (CRESTOR) 10 MG tablet, Take by mouth., Disp: , Rfl:  .  tacrolimus (PROTOPIC) 0.1 % ointment, Apply 1 application topically 2 (two) times daily as needed (rash). , Disp: , Rfl:  .  tamsulosin (FLOMAX) 0.4 MG CAPS capsule, TAKE 1 CAPSULE(0.4 MG) BY MOUTH DAILY, Disp: 90 capsule, Rfl: 0 .  vitamin B-12 (CYANOCOBALAMIN) 1000 MCG tablet, Take 1,000 mcg by mouth daily., Disp: , Rfl:  .  tiZANidine (ZANAFLEX) 4 MG tablet, Take 0.5-1 tablets (2-4 mg total) by mouth 2 (two) times daily as needed for muscle spasms., Disp: 60 tablet, Rfl: 0  Imaging Review    MR THORACIC SPINE WO CONTRAST  Narrative CLINICAL DATA:   Generalized weakness. Right leg pain. Multilevel fusion.  EXAM: MRI THORACIC AND LUMBAR SPINE WITHOUT CONTRAST  TECHNIQUE: Multiplanar and multiecho pulse sequences of the thoracic and lumbar spine were obtained without intravenous contrast.  COMPARISON:  CT abdomen pelvis 03/28/2018, lumbar MRI 02/14/2010  FINDINGS: MRI THORACIC SPINE FINDINGS  Alignment:  Normal  Vertebrae: Negative for fracture or mass.  Pedicle screw and posterior rod fusion T9 through the sacrum. There is significant artifact from hardware.  Cord:  No cord compression.  Normal cord signal.  Paraspinal and other soft tissues: Negative for paraspinous mass or fluid.  Disc levels:  T2-3: Mild disc bulging without stenosis  T3-4: Small right paracentral disc protrusion with cord flattening.  T5-6: Small central disc protrusion  T6-7: Small central disc protrusion  T7-8: Disc degeneration with Schmorl node. Small central disc protrusion and moderate left-sided disc protrusion extending into the foramen causing left foraminal encroachment. Bilateral facet degeneration contributes to foraminal stenosis. Mild narrowing of the spinal canal without significant stenosis  Pedicle screw fusion T9 through the sacrum. Hardware limits evaluation of the canal and foramina at these levels.  MRI LUMBAR SPINE FINDINGS  Segmentation:  Normal  Alignment: Normal  alignment at L2-3 which shows significant anterolisthesis on the prior MRI 2011. Improvement in retrolisthesis at L3-4 and L4-5 compared with the prior study.  Vertebrae: Pedicle screw and rod fusion extending from T9 through the sacrum. The hardware causes considerable artifact and limits evaluation the spinal canal and foramina.  Conus medullaris and cauda equina: Conus not well visualized due to artifact  Paraspinal and other soft tissues: Relatively small posterior fluid collection at the laminectomy site from L2 through L5.  Disc  levels:  Disc space evaluation is significantly limited due to hardware related artifact. Axial images are essentially nondiagnostic. Sagittal images suggest central disc protrusion at L2-3 with some degree of spinal stenosis. Disc degeneration and spurring at L3-4 and L4-5.  IMPRESSION: MR THORACIC SPINE IMPRESSION  Central and left-sided disc protrusion T7-8. There is significant left foraminal encroachment at this level.  Small central disc protrusions at T3-4, T5-6, T6-7  Pedicle screw and posterior rod fusion T9 through sacrum  MR LUMBAR SPINE IMPRESSION  Extensive artifact from fusion hardware limits evaluation of the spinal canal throughout the lumbar spine.  Disc protrusion at L2-3 with some degree of stenosis which is not clearly evaluated on this study. Disc degeneration and spondylosis at L3-4 L4-5 with posterior laminectomy.   Electronically Signed By: Franchot Gallo M.D. On: 04/29/2018 13:40   Lumbosacral Imaging: Lumbar MR wo contrast: Results for orders placed during the hospital encounter of 04/29/18  MR LUMBAR SPINE WO CONTRAST  Narrative CLINICAL DATA:  Generalized weakness. Right leg pain. Multilevel fusion.  EXAM: MRI THORACIC AND LUMBAR SPINE WITHOUT CONTRAST  TECHNIQUE: Multiplanar and multiecho pulse sequences of the thoracic and lumbar spine were obtained without intravenous contrast.  COMPARISON:  CT abdomen pelvis 03/28/2018, lumbar MRI 02/14/2010  FINDINGS: MRI THORACIC SPINE FINDINGS  Alignment:  Normal  Vertebrae: Negative for fracture or mass.  Pedicle screw and posterior rod fusion T9 through the sacrum. There is significant artifact from hardware.  Cord:  No cord compression.  Normal cord signal.  Paraspinal and other soft tissues: Negative for paraspinous mass or fluid.  Disc levels:  T2-3: Mild disc bulging without stenosis  T3-4: Small right paracentral disc protrusion with cord flattening.  T5-6: Small central  disc protrusion  T6-7: Small central disc protrusion  T7-8: Disc degeneration with Schmorl node. Small central disc protrusion and moderate left-sided disc protrusion extending into the foramen causing left foraminal encroachment. Bilateral facet degeneration contributes to foraminal stenosis. Mild narrowing of the spinal canal without significant stenosis  Pedicle screw fusion T9 through the sacrum. Hardware limits evaluation of the canal and foramina at these levels.  MRI LUMBAR SPINE FINDINGS  Segmentation:  Normal  Alignment: Normal alignment at L2-3 which shows significant anterolisthesis on the prior MRI 2011. Improvement in retrolisthesis at L3-4 and L4-5 compared with the prior study.  Vertebrae: Pedicle screw and rod fusion extending from T9 through the sacrum. The hardware causes considerable artifact and limits evaluation the spinal canal and foramina.  Conus medullaris and cauda equina: Conus not well visualized due to artifact  Paraspinal and other soft tissues: Relatively small posterior fluid collection at the laminectomy site from L2 through L5.  Disc levels:  Disc space evaluation is significantly limited due to hardware related artifact. Axial images are essentially nondiagnostic. Sagittal images suggest central disc protrusion at L2-3 with some degree of spinal stenosis. Disc degeneration and spurring at L3-4 and L4-5.  IMPRESSION: MR THORACIC SPINE IMPRESSION  Central and left-sided disc protrusion T7-8. There is significant  left foraminal encroachment at this level.  Small central disc protrusions at T3-4, T5-6, T6-7  Pedicle screw and posterior rod fusion T9 through sacrum  MR LUMBAR SPINE IMPRESSION  Extensive artifact from fusion hardware limits evaluation of the spinal canal throughout the lumbar spine.  Disc protrusion at L2-3 with some degree of stenosis which is not clearly evaluated on this study. Disc degeneration and  spondylosis at L3-4 L4-5 with posterior laminectomy.   Electronically Signed By: Franchot Gallo M.D. On: 04/29/2018 13:40   Narrative CLINICAL DATA:  Back pain.  Weakness.  EXAM: CT LUMBAR SPINE WITHOUT CONTRAST  TECHNIQUE: Multidetector CT imaging of the lumbar spine was performed without intravenous contrast administration. Multiplanar CT image reconstructions were also generated.  COMPARISON:  11/23/2013  FINDINGS: Segmentation: 5 lumbar type vertebral bodies assumed.  Alignment: No change in alignment.  Vertebrae: No acute bone finding. Previous fusion procedure beginning at T10 and extending to the sacroiliac region.  Paraspinal and other soft tissues: Aortic atherosclerosis. Otherwise negative.  Disc levels: T9-10 and T10-11: Disc degeneration of vacuum phenomenon. Endplate osteophytes. No apparent compressive stenosis. Degenerative changes at T9-10 do not appear to have progressed in any significant fashion since 2015.  Fusion segment from T10 through L5: Previously placed pedicle screws and posterior rods. No evidence of screw loosening or hardware failure. Therefore, there is no evidence of ongoing motion in this region. No evidence of hardware malposition. Canal and foramina appear sufficiently patent. One exception to this is the L2-3 level where there is prominent bone posterior to the disc space with some degree of stenosis. There is foraminal narrowing as well, particularly on the right.  At L5-S1, there does not appear to be definite solid bony union. There is nitrogen gas within the disc space. However, I do not see evidence of lucency around the L5 or S1 screws to establish motion. This is somewhat conflicting information. The central canal is sufficiently patent. There is bony foraminal narrowing right worse than left.  Iliac screws appear well positioned without evidence of motion or loosening. Small amount of nitrogen gas present in the  sacroiliac joints which could be degenerative or indicate a small degree of ongoing motion.  IMPRESSION: No definite complicating feature or unexpected finding. Very similar appearance to the study of 2015. No evidence of hardware loosening or failure. No significant progression of the adjacent segment degenerative disease at T9-10. At the L5-S1 level, there is progressive nitrogen gas within the disc space, but there is no evidence of screw loosening. Therefore, this could be due to progressive degeneration of the disc or could indicate a degree of ongoing motion, though without discernible screw loosening. Iliac screws look good.  Some canal stenosis at the L2-3 level because of bone posterior to the disc level. This is not definitely compressive however. At the L5-S1 level, there is foraminal narrowing because of osteophytic encroachment, worse on the right.   Electronically Signed By: Nelson Chimes M.D.   Complexity Note: Imaging results reviewed. Results shared with [name], using Layman's terms.                         ROS  Cardiovascular: High blood pressure Pulmonary or Respiratory: Wheezing and difficulty taking a deep full breath (Asthma) Neurological: No reported neurological signs or symptoms such as seizures, abnormal skin sensations, urinary and/or fecal incontinence, being born with an abnormal open spine and/or a tethered spinal cord Psychological-Psychiatric: No reported psychological or psychiatric signs  or symptoms such as difficulty sleeping, anxiety, depression, delusions or hallucinations (schizophrenial), mood swings (bipolar disorders) or suicidal ideations or attempts Gastrointestinal: Reflux or heatburn Genitourinary: No reported renal or genitourinary signs or symptoms such as difficulty voiding or producing urine, peeing blood, non-functioning kidney, kidney stones, difficulty emptying the bladder, difficulty controlling the flow of urine, or chronic  kidney disease Hematological: No reported hematological signs or symptoms such as prolonged bleeding, low or poor functioning platelets, bruising or bleeding easily, hereditary bleeding problems, low energy levels due to low hemoglobin or being anemic Endocrine: High blood sugar requiring insulin (IDDM) Rheumatologic: Rheumatoid arthritis Musculoskeletal: Negative for myasthenia gravis, muscular dystrophy, multiple sclerosis or malignant hyperthermia Work History: Retired  Allergies  Ms. Rocha is allergic to ibuprofen, indomethacin, infliximab, methotrexate derivatives, moexipril, percocet [oxycodone-acetaminophen], and naprosyn [naproxen].  Laboratory Chemistry Profile   Renal Lab Results  Component Value Date   BUN 12 07/09/2019   CREATININE 0.90 10/16/2019   GFRAA >60 07/09/2019   GFRNONAA >60 07/09/2019   SPECGRAV 1.020 03/31/2020   PHUR 5.0 03/31/2020   PROTEINUR 2+ (A) 03/31/2020     Electrolytes Lab Results  Component Value Date   NA 134 (L) 07/09/2019   K 3.2 (L) 07/09/2019   CL 98 07/09/2019   CALCIUM 8.6 (L) 07/09/2019   MG 2.3 07/08/2019     Hepatic Lab Results  Component Value Date   AST 23 07/05/2019   ALT 19 07/05/2019   ALBUMIN 4.0 07/05/2019   ALKPHOS 103 07/05/2019   LIPASE 19 07/05/2019     ID Lab Results  Component Value Date   [place] NEGATIVE 07/05/2019     Bone No results found for: VD25OH, VD125OH2TOT, IO2703JK0, XF8182XH3, 25OHVITD1, 25OHVITD2, 25OHVITD3, TESTOFREE, TESTOSTERONE   Endocrine Lab Results  Component Value Date   GLUCOSE 216 (H) 07/09/2019   GLUCOSEU Trace (A) 03/31/2020   HGBA1C 7.7 (H) 07/05/2019     Neuropathy Lab Results  Component Value Date   HGBA1C 7.7 (H) 07/05/2019     CNS No results found for: COLORCSF, APPEARCSF, RBCCOUNTCSF, WBCCSF, POLYSCSF, LYMPHSCSF, EOSCSF, PROTEINCSF, GLUCCSF, JCVIRUS, CSFOLI, IGGCSF, LABACHR, ACETBL, LABACHR, ACETBL   Inflammation (CRP: Acute  ESR: Chronic) Lab Results   Component Value Date   ESRSEDRATE 21 06/23/2014   LATICACIDVEN 1.2 07/06/2019     Rheumatology No results found for: RF, ANA, LABURIC, URICUR, LYMEIGGIGMAB, LYMEABIGMQN, HLAB27   Coagulation Lab Results  Component Value Date   INR 0.9 06/23/2014   LABPROT 12.3 06/23/2014   APTT 32.0 06/23/2014   PLT 198 07/08/2019     Cardiovascular Lab Results  Component Value Date   HGB 11.9 (L) 07/08/2019   HCT 37.2 07/08/2019     Screening Lab Results  Component Value Date   SARSCOV2NAA NEGATIVE 07/05/2019     Cancer No results found for: CEA, CA125, LABCA2   Allergens No results found for: ALMOND, APPLE, ASPARAGUS, AVOCADO, BANANA, BARLEY, BASIL, BAYLEAF, GREENBEAN, LIMABEAN, WHITEBEAN, BEEFIGE, REDBEET, BLUEBERRY, BROCCOLI, CABBAGE, MELON, CARROT, CASEIN, CASHEWNUT, CAULIFLOWER, CELERY     Note: Lab results reviewed.  [place]  Drug: Ms. [place]  reports no history of drug use. Alcohol:  reports no history of alcohol use. Tobacco:  reports that she has never smoked. She has never used smokeless tobacco. Medical:  has a past medical history of Arthritis, Asthma, Cancer ([place]) (1996), Diabetes mellitus without complication ([place]), GERD (gastroesophageal reflux disease), Glaucoma, Hyperlipidemia, Hypertension, Lumbar disc disease, Small bowel obstruction ([place]), and Vitamin B 12 deficiency. Family: family history is not  on file.  Past Surgical History:  Procedure Laterality Date  . ABDOMINAL HYSTERECTOMY    . BACK SURGERY    . COLON SURGERY    . COLONOSCOPY WITH PROPOFOL N/A 05/22/2017   Procedure: COLONOSCOPY WITH PROPOFOL;  Surgeon: Manya Silvas, MD;  Location: [facility] ENDOSCOPY;  Service: Endoscopy;  Laterality: N/A;  . FRACTURE SURGERY    . KNEE ARTHROSCOPY    . LUMBAR LAMINECTOMY     Active Ambulatory Problems    Diagnosis Date Noted  . B-complex deficiency 03/03/2014  . Benign essential hypertension 01/16/2014  . Bladder neoplasm of uncertain malignant potential 09/14/2013   . Chronic cystitis 05/04/2013  . DM type 2 with diabetic mixed hyperlipidemia ([place]) 04/30/2018  . Sepsis ([place]) 07/05/2019  . CAP (community acquired pneumonia) 07/05/2019  . Gastroenteritis 07/05/2019  . Unspecified thoracic, thoracolumbar and lumbosacral intervertebral disc disorder 01/16/2014  . Squamous cell metaplasia of urinary bladder 04/29/2014  . Scoliosis (and kyphoscoliosis), idiopathic 06/24/2013  . Rheumatoid arthritis involving multiple sites with positive rheumatoid factor ([place]) 08/13/2016  . Osteoarthritis of knee 04/01/2014  . Nocturia 08/05/2013  . Malignant neoplasm of colon ([place]) 01/16/2014  . Increased frequency of urination 05/04/2013  . Incomplete emptying of bladder 05/04/2013  . Hyperlipidemia, mixed 04/18/2016  . History of colon cancer 10/18/2014  . Gross hematuria 11/01/2015  . Elevated serum creatinine 10/16/2017  . SI (sacroiliac) joint dysfunction 04/19/2020  . 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T9-Sacrum) 04/19/2020  . Lumbar radiculopathy 04/19/2020  . Lumbar spondylosis 04/19/2020  . Chronic pain syndrome 04/19/2020   Resolved Ambulatory Problems    Diagnosis Date Noted  . No Resolved Ambulatory Problems   Past Medical History:  Diagnosis Date  . Arthritis   . Asthma   . Cancer ([place]) 1996  . Diabetes mellitus without complication ([place])   . GERD (gastroesophageal reflux disease)   . Glaucoma   . Hyperlipidemia   . Hypertension   . Lumbar disc disease   . Small bowel obstruction ([place])   . Vitamin B 12 deficiency    Constitutional Exam  General appearance: alert, cooperative, slowed mentation and in mild distress Vitals:   04/19/20 1011  BP: (!) 146/81  Pulse: 71  Resp: 17  Temp: (!) 97.3 F (36.3 C)  TempSrc: Temporal  SpO2: 100%  Weight: 170 lb (77.1 kg)  Height: '5\' 6"'$  (1.676 m)   BMI Assessment: Estimated body mass index is 27.44 kg/m as calculated from the following:   Height as of this encounter: '5\' 6"'$  (1.676 m).   Weight  as of this encounter: 170 lb (77.1 kg).  BMI interpretation table: BMI level Category Range association with higher incidence of chronic pain  <18 kg/m2 Underweight   18.5-24.9 kg/m2 Ideal body weight   25-29.9 kg/m2 Overweight Increased incidence by 20%  30-34.9 kg/m2 Obese (Class I) Increased incidence by 68%  35-39.9 kg/m2 Severe obesity (Class II) Increased incidence by 136%  >40 kg/m2 Extreme obesity (Class III) Increased incidence by 254%   Patient's current BMI Ideal Body weight  Body mass index is 27.44 kg/m. Ideal body weight: 59.3 kg (130 lb 11.7 oz) Adjusted ideal body weight: 66.4 kg (146 lb 7 oz)   BMI Readings from Last 4 Encounters:  04/19/20 27.44 kg/m  03/31/20 27.76 kg/m  03/04/20 27.76 kg/m  01/24/20 27.76 kg/m   Wt Readings from Last 4 Encounters:  04/19/20 170 lb (77.1 kg)  03/31/20 172 lb (78 kg)  03/04/20 172 lb (78 kg)  01/24/20 172  lb (78 kg)    Psych/Mental status: Alert, oriented x 3 (person, place, & time)       Eyes: PERLA Respiratory: No evidence of acute respiratory distress   Thoracic Spine Area Exam  Skin & Axial Inspection: Well healed scar from previous spine surgery detected Alignment: Asymmetric Functional ROM: Decreased ROM Stability: No instability detected Muscle Tone/Strength: Functionally intact. No obvious neuro-muscular anomalies detected. Sensory (Neurological): Dermatomal pain pattern Muscle strength & Tone: No palpable anomalies  Lumbar Exam  Skin & Axial Inspection: Well healed scar from previous spine surgery detected Alignment: Asymmetric Functional ROM: Decreased ROM       Stability: No instability detected Muscle Tone/Strength: Functionally intact. No obvious neuro-muscular anomalies detected. Sensory (Neurological): Musculoskeletal pain pattern  Provocative Tests:  Lateral bending test: deferred today       Patrick's Maneuver: (+) for left-sided S-I arthralgia             FABER* test: (+) for left-sided  S-I arthralgia             S-I anterior distraction/compression test: (+) Left-sided       S-I lateral compression test: deferred today         S-I Thigh-thrust test: deferred today         S-I Gaenslen's test: deferred today         *(Flexion, ABduction and External Rotation)  Gait & Posture Assessment  Ambulation: Patient ambulates using a walker Gait: Limited. Using assistive device to ambulate Posture: Difficulty standing up straight, due to pain   Lower Extremity Exam    Side: Right lower extremity  Side: Left lower extremity  Stability: No instability observed          Stability: No instability observed          Skin & Extremity Inspection: Evidence of prior arthroplastic surgery  Skin & Extremity Inspection: Evidence of prior arthroplastic surgery  Functional ROM: Unrestricted ROM                  Functional ROM: Pain restricted ROM for hip and knee joints          Muscle Tone/Strength: Functionally intact. No obvious neuro-muscular anomalies detected.  Muscle Tone/Strength: Functionally intact. No obvious neuro-muscular anomalies detected.  Sensory (Neurological): Musculoskeletal pain pattern        Sensory (Neurological): Musculoskeletal pain pattern        DTR: Patellar: deferred today Achilles: deferred today Plantar: deferred today  DTR: Patellar: deferred today Achilles: deferred today Plantar: deferred today  Palpation: No palpable anomalies  Palpation: No palpable anomalies   Assessment  Primary Diagnosis & Pertinent Problem List: The primary encounter diagnosis was Chronic left SI joint pain. Diagnoses of SI (sacroiliac) joint dysfunction (left), 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T9-Sacrum), Chronic radicular lumbar pain, Lumbar radiculopathy, Lumbar spondylosis, and Chronic pain syndrome were also pertinent to this visit.  Visit Diagnosis (New problems to examiner): 1. Chronic left SI joint pain   2. SI (sacroiliac) joint dysfunction (left)   3. History of  fusion of lumbar spine (T9-Sacrum)   4. Chronic radicular lumbar pain   5. Lumbar radiculopathy   6. Lumbar spondylosis   7. Chronic pain syndrome    Plan of Care (Initial workup plan)   Likely SI joint related pain, discussed diagnostic left sacroiliac joint injection.  Will obtain left SI joint x-ray.  Recommend tizanidine as below.   Imaging Orders     DG Si Joints  Procedure Orders  SACROILIAC JOINT INJECTION Pharmacotherapy (current): Medications ordered:  Meds ordered this encounter  Medications  . tiZANidine (ZANAFLEX) 4 MG tablet    Sig: Take 0.5-1 tablets (2-4 mg total) by mouth 2 (two) times daily as needed for muscle spasms.    Dispense:  60 tablet    Refill:  0    Do not place this medication, or any other prescription from our practice, on "Automatic Refill". Patient may have prescription filled one day early if pharmacy is closed on scheduled refill date.   Medications administered during this visit: Aariel H. Abad had no medications administered during this visit.    Provider-requested follow-up: Return in about 1 week (around 04/26/2020) for LEft SI-J w/o sed.  Future Appointments  Date Time Provider [place]  04/27/2020 10:30 AM Gillis Santa, MD [facility] None  05/05/2020  9:30 AM Ernestine Conrad, Larene Beach A, PA-C [facility] None  10/19/2020  1:30 PM McGowan, Hunt Oris, PA-C [facility] None    Note by: Gillis Santa, MD Date: 04/19/2020; Time: 12:34 PM

## 2020-04-27 ENCOUNTER — Other Ambulatory Visit: Payer: Self-pay

## 2020-04-27 ENCOUNTER — Encounter: Payer: Self-pay | Admitting: Student in an Organized Health Care Education/Training Program

## 2020-04-27 ENCOUNTER — Ambulatory Visit (HOSPITAL_BASED_OUTPATIENT_CLINIC_OR_DEPARTMENT_OTHER): Payer: Medicare Other | Admitting: Student in an Organized Health Care Education/Training Program

## 2020-04-27 ENCOUNTER — Ambulatory Visit
Admission: RE | Admit: 2020-04-27 | Discharge: 2020-04-27 | Disposition: A | Discharge status: Home / Self Care | Payer: Medicare Other | Source: Ambulatory Visit | Attending: Student in an Organized Health Care Education/Training Program | Admitting: Student in an Organized Health Care Education/Training Program

## 2020-04-27 VITALS — BP 172/76 | HR 64 | Temp 97.4°F | Resp 18 | Ht 66.0 in | Wt 172.0 lb

## 2020-04-27 DIAGNOSIS — G894 Chronic pain syndrome: Secondary | ICD-10-CM | POA: Diagnosis not present

## 2020-04-27 DIAGNOSIS — G8929 Other chronic pain: Secondary | ICD-10-CM

## 2020-04-27 DIAGNOSIS — M545 Low back pain: Secondary | ICD-10-CM | POA: Diagnosis present

## 2020-04-27 DIAGNOSIS — M533 Sacrococcygeal disorders, not elsewhere classified: Secondary | ICD-10-CM | POA: Diagnosis not present

## 2020-04-27 DIAGNOSIS — Z981 Arthrodesis status: Secondary | ICD-10-CM | POA: Diagnosis not present

## 2020-04-27 MED ORDER — LIDOCAINE HCL 2 % IJ SOLN
INTRAMUSCULAR | Status: AC
  Filled 2020-04-27: qty 20

## 2020-04-27 MED ORDER — ROPIVACAINE HCL 2 MG/ML IJ SOLN
INTRAMUSCULAR | Status: AC
  Filled 2020-04-27: qty 10

## 2020-04-27 MED ORDER — LIDOCAINE HCL 2 % IJ SOLN
20.0000 mL | Freq: Once | INTRAMUSCULAR | Status: AC
  Administered 2020-04-27: 400 mg

## 2020-04-27 MED ORDER — METHYLPREDNISOLONE ACETATE 40 MG/ML IJ SUSP
40.0000 mg | Freq: Once | INTRAMUSCULAR | Status: AC
  Administered 2020-04-27: 40 mg via INTRA_ARTICULAR

## 2020-04-27 MED ORDER — IOHEXOL 180 MG/ML  SOLN
10.0000 mL | Freq: Once | INTRAMUSCULAR | Status: AC
  Administered 2020-04-27: 10 mL via INTRA_ARTICULAR

## 2020-04-27 MED ORDER — ROPIVACAINE HCL 2 MG/ML IJ SOLN
9.0000 mL | Freq: Once | INTRAMUSCULAR | Status: AC
  Administered 2020-04-27: 9 mL via INTRA_ARTICULAR

## 2020-04-27 MED ORDER — METHYLPREDNISOLONE ACETATE 40 MG/ML IJ SUSP
INTRAMUSCULAR | Status: AC
  Filled 2020-04-27: qty 1

## 2020-04-27 NOTE — Progress Notes (Signed)
Safety precautions to be maintained throughout the outpatient stay will include: orient to surroundings, keep bed in low position, maintain call bell within reach at all times, provide assistance with transfer out of bed and ambulation.  

## 2020-04-27 NOTE — Progress Notes (Signed)
PROVIDER NOTE: Information contained herein reflects review and annotations entered in association with encounter. Interpretation of such information and data should be left to medically-trained personnel. Information provided to patient can be located elsewhere in the medical record under "Patient Instructions". Document created using STT-dictation technology, any transcriptional errors that may result from process are unintentional.    Patient: Stephanie [name]  Service Category: Procedure  Provider: Gillis Santa, MD  DOB: 1940/11/16  DOS: 04/27/2020  Location: [facility]  MRN: [id number]  Setting: Ambulatory - outpatient  Referring Provider: Rusty Aus, MD  Type: Established Patient  Specialty: Interventional Pain Management  PCP: Rusty Aus, MD   Primary Reason for Visit: Interventional Pain Management Treatment. CC: Back Pain (lower)  Procedure:          Anesthesia, Analgesia, Anxiolysis:  Type: Diagnostic Sacroiliac Joint Steroid Injection          Region: Inferior Lumbosacral Region Level: PIIS (Posterior Inferior Iliac Spine) Laterality: Left-Side  Type: Local Anesthesia  Local Anesthetic: Lidocaine 1-2%  Position: Prone           Indications: 1. Chronic left SI joint pain   2. SI (sacroiliac) joint dysfunction (left)   3. 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T9-Sacrum)   4. Chronic pain syndrome    Pain Score: Pre-procedure: 4 /10 Post-procedure: 4 /10   Pre-op Assessment:  Stephanie [name] is a 79 y.o. (year old), female patient, seen today for interventional treatment. She  has a past surgical history that includes Back surgery; Colon surgery; Fracture surgery; Abdominal hysterectomy; Knee arthroscopy; Lumbar laminectomy; and Colonoscopy with propofol (N/A, 05/22/2017). Stephanie [name] has a current medication list which includes the following prescription(s): amlodipine, contour test, cranberry, glipizide, hydrochlorothiazide, leflunomide, metformin, metoprolol  tartrate, omeprazole, potassium chloride sa, rosuvastatin, tacrolimus, tamsulosin, tizanidine, and vitamin b-12. Her primarily concern today is the Back Pain (lower)  Initial Vital Signs:  Pulse/HCG Rate: 64ECG Heart Rate: 67 Temp: (!) 97.4 F (36.3 C) Resp: 16 BP: (!) 137/75 SpO2: 97 %  BMI: Estimated body mass index is 27.76 kg/m as calculated from the following:   Height as of this encounter: 5\' 6"  (1.676 m).   Weight as of this encounter: 172 lb (78 kg).  Risk Assessment: Allergies: Reviewed. She is allergic to ibuprofen, indomethacin, infliximab, methotrexate derivatives, moexipril, percocet [oxycodone-acetaminophen], and naprosyn [naproxen].  Allergy Precautions: None required Coagulopathies: Reviewed. None identified.  Blood-thinner therapy: None at this time Active Infection(s): Reviewed. None identified. Stephanie [name] is afebrile  Site Confirmation: Stephanie [name] was asked to confirm the procedure and laterality before marking the site Procedure checklist: Completed Consent: Before the procedure and under the influence of no sedative(s), amnesic(s), or anxiolytics, the patient was informed of the treatment options, risks and possible complications. To fulfill our ethical and legal obligations, as recommended by the American Medical Association's Code of Ethics, I have informed the patient of my clinical impression; the nature and purpose of the treatment or procedure; the risks, benefits, and possible complications of the intervention; the alternatives, including doing nothing; the risk(s) and benefit(s) of the alternative treatment(s) or procedure(s); and the risk(s) and benefit(s) of doing nothing. The patient was provided information about the general risks and possible complications associated with the procedure. These may include, but are not limited to: failure to achieve desired goals, infection, bleeding, organ or nerve damage, allergic reactions, paralysis, and death. In addition,  the patient was informed of those risks and complications associated to the procedure, such  as failure to decrease pain; infection; bleeding; organ or nerve damage with subsequent damage to sensory, motor, and/or autonomic systems, resulting in permanent pain, numbness, and/or weakness of one or several areas of the body; allergic reactions; (i.e.: anaphylactic reaction); and/or death. Furthermore, the patient was informed of those risks and complications associated with the medications. These include, but are not limited to: allergic reactions (i.e.: anaphylactic or anaphylactoid reaction(s)); adrenal axis suppression; blood sugar elevation that in diabetics may result in ketoacidosis or comma; water retention that in patients with history of congestive heart failure may result in shortness of breath, pulmonary edema, and decompensation with resultant heart failure; weight gain; swelling or edema; medication-induced neural toxicity; particulate matter embolism and blood vessel occlusion with resultant organ, and/or nervous system infarction; and/or aseptic necrosis of one or more joints. Finally, the patient was informed that Medicine is not an exact science; therefore, there is also the possibility of unforeseen or unpredictable risks and/or possible complications that may result in a catastrophic outcome. The patient indicated having understood very clearly. We have given the patient no guarantees and we have made no promises. Enough time was given to the patient to ask questions, all of which were answered to the patient's satisfaction. Stephanie [name] has indicated that she wanted to continue with the procedure. Attestation: I, the ordering provider, attest that I have discussed with the patient the benefits, risks, side-effects, alternatives, likelihood of achieving goals, and potential problems during recovery for the procedure that I have provided informed consent. Date  Time: 04/27/2020 10:17  AM  Pre-Procedure Preparation:  Monitoring: As per clinic protocol. Respiration, ETCO2, SpO2, BP, heart rate and rhythm monitor placed and checked for adequate function Safety Precautions: Patient was assessed for positional comfort and pressure points before starting the procedure. Time-out: I initiated and conducted the "Time-out" before starting the procedure, as per protocol. The patient was asked to participate by confirming the accuracy of the "Time Out" information. Verification of the correct person, site, and procedure were performed and confirmed by me, the nursing staff, and the patient. "Time-out" conducted as per Joint Commission's Universal Protocol (UP.01.01.01). Time: 1046  Description of Procedure:          Target Area: Superior, posterior, aspect of the sacroiliac fissure Approach: Posterior, paraspinal, ipsilateral approach. Area Prepped: Entire Lower Lumbosacral Region DuraPrep (Iodine Povacrylex [0.7% available iodine] and Isopropyl Alcohol, 74% w/w) Safety Precautions: Aspiration looking for blood return was conducted prior to all injections. At no point did we inject any substances, as a needle was being advanced. No attempts were made at seeking any paresthesias. Safe injection practices and needle disposal techniques used. Medications properly checked for expiration dates. SDV (single dose vial) medications used. Description of the Procedure: Protocol guidelines were followed. The patient was placed in position over the procedure table. The target area was identified and the area prepped in the usual manner. Skin & deeper tissues infiltrated with local anesthetic. Appropriate amount of time allowed to pass for local anesthetics to take effect. The procedure needle was advanced under fluoroscopic guidance into the sacroiliac joint until a firm endpoint was obtained. Proper needle placement secured. Negative aspiration confirmed. Solution injected in intermittent fashion, asking  for systemic symptoms every 0.5cc of injectate. The needles were then removed and the area cleansed, making sure to leave some of the prepping solution back to take advantage of its long term bactericidal properties. Vitals:   04/27/20 1020 04/27/20 1047 04/27/20 1053  BP: (!) 137/75 (!) 191/89 Marland Kitchen)  172/76  Pulse: 64    Resp: 16 16 18   Temp: (!) 97.4 F (36.3 C)    SpO2: 97% 97% 98%  Weight: 172 lb (78 kg)    Height: 5\' 6"  (1.676 m)      Start Time: 1046 hrs. End Time: 1052 hrs. Materials:  Needle(s) Type: Spinal Needle Gauge: 25G Length: 3.5-in Medication(s): Please see orders for medications and dosing details. 10 cc solution made of 9 cc of 0.2% ropivacaine, 1 cc of methylprednisolone, 40 mg/cc.  5 cc injected intra-articular, 5 cc injected periarticular Imaging Guidance (Non-Spinal):          Type of Imaging Technique: Fluoroscopy Guidance (Non-Spinal) Indication(s): Assistance in needle guidance and placement for procedures requiring needle placement in or near specific anatomical locations not easily accessible without such assistance. Exposure Time: Please see nurses notes. Contrast: Before injecting any contrast, we confirmed that the patient did not have an allergy to iodine, shellfish, or radiological contrast. Once satisfactory needle placement was completed at the desired level, radiological contrast was injected. Contrast injected under live fluoroscopy. No contrast complications. See chart for type and volume of contrast used. Fluoroscopic Guidance: I was personally present during the use of fluoroscopy. "Tunnel Vision Technique" used to obtain the best possible view of the target area. Parallax error corrected before commencing the procedure. "Direction-depth-direction" technique used to introduce the needle under continuous pulsed fluoroscopy. Once target was reached, antero-posterior, oblique, and lateral fluoroscopic projection used confirm needle placement in all planes.  Images permanently stored in EMR. Interpretation: I personally interpreted the imaging intraoperatively. Adequate needle placement confirmed in multiple planes. Appropriate spread of contrast into desired area was observed. No evidence of afferent or efferent intravascular uptake. Permanent images saved into the patient's record.  Antibiotic Prophylaxis:   Anti-infectives (From admission, onward)   None     Indication(s): None identified  Post-operative Assessment:  Post-procedure Vital Signs:  Pulse/HCG Rate: 6469 Temp: (!) 97.4 F (36.3 C) Resp: 18 BP: (!) 172/76 SpO2: 98 %  EBL: None  Complications: No immediate post-treatment complications observed by team, or reported by patient.  Note: The patient tolerated the entire procedure well. A repeat set of vitals were taken after the procedure and the patient was kept under observation following institutional policy, for this type of procedure. Post-procedural neurological assessment was performed, showing return to baseline, prior to discharge. The patient was provided with post-procedure discharge instructions, including a section on how to identify potential problems. Should any problems arise concerning this procedure, the patient was given instructions to immediately contact us, at any time, without hesitation. In any case, we plan to contact the patient by telephone for a follow-up status report regarding this interventional procedure.  Comments:  No additional relevant information.  Plan of Care  Orders:  Orders Placed This Encounter  Procedures  . DG PAIN [facility] C-ARM 1-60 MIN NO REPORT    Intraoperative interpretation by procedural physician at [place].    Standing Status:   Standing    Number of Occurrences:   1    Order Specific Question:   Reason for exam:    Answer:   Assistance in needle guidance and placement for procedures requiring needle placement in or near specific anatomical locations not easily  accessible without such assistance.    Medications ordered for procedure: Meds ordered this encounter  Medications  . iohexol (OMNIPAQUE) 180 MG/ML injection 10 mL    Must be Myelogram-compatible. If not available, you may substitute with  a water-soluble, non-ionic, hypoallergenic, myelogram-compatible radiological contrast medium.  Marland Kitchen lidocaine (XYLOCAINE) 2 % (with pres) injection 400 mg  . methylPREDNISolone acetate (DEPO-MEDROL) injection 40 mg  . ropivacaine (PF) 2 mg/mL (0.2%) (NAROPIN) injection 9 mL   Medications administered: We administered iohexol, lidocaine, methylPREDNISolone acetate, and ropivacaine (PF) 2 mg/mL (0.2%).  See the medical record for exact dosing, route, and time of administration.  Follow-up plan:   Return in about 4 weeks (around 05/25/2020) for Post Procedure Evaluation, virtual.      Left SI joint injection without sedation 04/27/2020   Recent Visits Date Type Provider Dept  04/19/20 Office Visit Gillis Santa, MD [facility]  Showing recent visits within past 90 days and meeting all other requirements Today's Visits Date Type Provider Dept  04/27/20 Procedure visit Gillis Santa, MD [facility]  Showing today's visits and meeting all other requirements Future Appointments Date Type Provider Dept  05/25/20 Appointment Gillis Santa, MD [facility]  Showing future appointments within next 90 days and meeting all other requirements  Disposition: Discharge home  Discharge (Date  Time): 04/27/2020; 1100 hrs.   Primary Care Physician: Rusty Aus, MD Location: [facility] Note by: Gillis Santa, MD Date: 04/27/2020; Time: 11:02 AM  Disclaimer:  Medicine is not an exact science. The only guarantee in medicine is that nothing is guaranteed. It is important to note that the decision to proceed with this intervention was based on the information collected from the patient. The Data and conclusions  were drawn from the patient's questionnaire, the interview, and the physical examination. Because the information was provided in large part by the patient, it cannot be guaranteed that it has not been purposely or unconsciously manipulated. Every effort has been made to obtain as much relevant data as possible for this evaluation. It is important to note that the conclusions that lead to this procedure are derived in large part from the available data. Always take into account that the treatment will also be dependent on availability of resources and existing treatment guidelines, considered by other Pain Management Practitioners as being common knowledge and practice, at the time of the intervention. For Medico-Legal purposes, it is also important to point out that variation in procedural techniques and pharmacological choices are the acceptable norm. The indications, contraindications, technique, and results of the above procedure should only be interpreted and judged by a Board-Certified Interventional Pain Specialist with extensive familiarity and expertise in the same exact procedure and technique.

## 2020-04-27 NOTE — Patient Instructions (Signed)
Pain Management Discharge Instructions  General Discharge Instructions :  If you need to reach your doctor call: Monday-Friday 8:00 am - 4:00 pm at [phone] or toll free [phone].  After clinic hours [phone] to have operator reach doctor.  Bring all of your medication bottles to all your appointments in the pain clinic.  To cancel or reschedule your appointment with Pain Management please remember to call 24 hours in advance to avoid a fee.  Refer to the educational materials which you have been given on: General Risks, I had my Procedure. Discharge Instructions, Post Sedation.  Post Procedure Instructions:  The drugs you were given will stay in your system until tomorrow, so for the next 24 hours you should not drive, make any legal decisions or drink any alcoholic beverages.  You may eat anything you prefer, but it is better to start with liquids then soups and crackers, and gradually work up to solid foods.  Please notify your doctor immediately if you have any unusual bleeding, trouble breathing or pain that is not related to your normal pain.  Depending on the type of procedure that was done, some parts of your body may feel week and/or numb.  This usually clears up by tonight or the next day.  Walk with the use of an assistive device or accompanied by an adult for the 24 hours.  You may use ice on the affected area for the first 24 hours.  Put ice in a Ziploc bag and cover with a towel and place against area 15 minutes on 15 minutes off.  You may switch to heat after 24 hours.Sacroiliac (SI) Joint Injection Patient Information  Description: The sacroiliac joint connects the scrum (very low back and tailbone) to the ilium (a pelvic bone which also forms half of the hip joint).  Normally this joint experiences very little motion.  When this joint becomes inflamed or unstable low back and or hip and pelvis pain may result.  Injection of this joint with local anesthetics  (numbing medicines) and steroids can provide diagnostic information and reduce pain.  This injection is performed with the aid of x-ray guidance into the tailbone area while you are lying on your stomach.   You may experience an electrical sensation down the leg while this is being done.  You may also experience numbness.  We also may ask if we are reproducing your normal pain during the injection.  Conditions which may be treated SI injection:   Low back, buttock, hip or leg pain  Preparation for the Injection:  1. Do not eat any solid food or dairy products within 8 hours of your appointment.  2. You may drink clear liquids up to 3 hours before appointment.  Clear liquids include water, black coffee, juice or soda.  No milk or cream please. 3. You may take your regular medications, including pain medications with a sip of water before your appointment.  Diabetics should hold regular insulin (if take separately) and take 1/2 normal NPH dose the morning of the procedure.  Carry some sugar containing items with you to your appointment. 4. A driver must accompany you and be prepared to drive you home after your procedure. 5. Bring all of your current medications with you. 6. An IV may be inserted and sedation may be given at the discretion of the physician. 7. A blood pressure cuff, EKG and other monitors will often be applied during the procedure.  Some patients may need to have extra oxygen   administered for a short period.  8. You will be asked to provide medical information, including your allergies, prior to the procedure.  We must know immediately if you are taking blood thinners (like Coumadin/Warfarin) or if you are allergic to IV iodine contrast (dye).  We must know if you could possible be pregnant.  Possible side effects:   Bleeding from needle site  Infection (rare, may require surgery)  Nerve injury (rare)  Numbness & tingling (temporary)  A brief convulsion or  seizure  Light-headedness (temporary)  Pain at injection site (several days)  Decreased blood pressure (temporary)  Weakness in the leg (temporary)   Call if you experience:   New onset weakness or numbness of an extremity below the injection site that last more than 8 hours.  Hives or difficulty breathing ( go to the emergency room)  Inflammation or drainage at the injection site  Any new symptoms which are concerning to you  Please note:  Although the local anesthetic injected can often make your back/ hip/ buttock/ leg feel good for several hours after the injections, the pain will likely return.  It takes 3-7 days for steroids to work in the sacroiliac area.  You may not notice any pain relief for at least that one week.  If effective, we will often do a series of three injections spaced 3-6 weeks apart to maximally decrease your pain.  After the initial series, we generally will wait some months before a repeat injection of the same type.  If you have any questions, please call [phone] [facility]   

## 2020-05-04 NOTE — Progress Notes (Signed)
Error

## 2020-05-05 ENCOUNTER — Encounter: Payer: Self-pay | Admitting: Urology

## 2020-05-05 ENCOUNTER — Other Ambulatory Visit: Payer: Self-pay

## 2020-05-05 ENCOUNTER — Ambulatory Visit (INDEPENDENT_AMBULATORY_CARE_PROVIDER_SITE_OTHER): Payer: Medicare Other | Admitting: Urology

## 2020-05-05 VITALS — BP 169/72 | HR 91 | Ht 66.0 in | Wt 170.0 lb

## 2020-05-05 DIAGNOSIS — N390 Urinary tract infection, site not specified: Secondary | ICD-10-CM | POA: Diagnosis not present

## 2020-05-05 DIAGNOSIS — R339 Retention of urine, unspecified: Secondary | ICD-10-CM

## 2020-05-05 DIAGNOSIS — N952 Postmenopausal atrophic vaginitis: Secondary | ICD-10-CM | POA: Diagnosis not present

## 2020-05-05 LAB — BLADDER SCAN AMB NON-IMAGING: Scan Result: 402

## 2020-05-05 NOTE — Progress Notes (Signed)
05/05/2020 9:55 AM   Stephanie [name] 1940/12/17 [id number]  Referring provider: Rusty Aus, MD [place] [facility] [place],  [place] [address]  Chief Complaint  Patient presents with  . incomplete bladder emtying    HPI: Stephanie [name] is a 79 y.o. female with incomplete bladder emptying, vaginal atrophy and rUTI's who presents with her husband, Stephanie [name] for follow up.    Incomplete bladder emptying PVR 402 mL.   She is catheterizing herself twice daily with PVR's ~ 350 cc.  She is taking Flomax 0.4 mg - two capsules daily   Vaginal atrophy Applying vaginal estrogen cream 3 nights weekly  rUTI's Reviewing her records: E. coli resistant to ampicillin, ampicillin/sulbactam on 07/05/2019 Staphylococcus epidermidis resistant to ciprofloxacin, penicillin and tetracycline on July 16, 2019 Proteus Mirabella's resistant to nitrofurantoin and tetracycline on October 21, 2019 E. coli resistant to ampicillin, cefazolin, tetracycline and trimethoprim/sulfa on November 16, 2019 Klebsiella pneumoniae resistant to ampicillin on December 03, 2019 Klebsiella pneumonia resistant to ampicillin nitrofurantoin on January 24, 2020 E. coli resistant to ciprofloxacin, levofloxacin, tetracycline on March 04, 2020  Risk factors: age, postmenopausal, incomplete bladder emptying and limited fluid intake  Her symptoms with a urinary tract infection consist of dysuria, frequency and gross hematuria. She is postmenopausal.  She denies constipation and/or diarrhea.   She does engage in good perineal hygiene. She does not take tub baths.  She is taking cranberry tablets and probiotics.  She does readily admit she does not drink enough water.   She denies any infections today.  PMH: Past Medical History:  Diagnosis Date  . Arthritis   . Asthma   . Cancer ([place]) 1996   colon  . Diabetes mellitus without complication ([place])   . GERD (gastroesophageal reflux disease)   .  Glaucoma   . Hyperlipidemia   . Hypertension   . Lumbar disc disease   . Small bowel obstruction ([place])   . Vitamin B 12 deficiency     Surgical History: Past Surgical History:  Procedure Laterality Date  . ABDOMINAL HYSTERECTOMY    . BACK SURGERY    . COLON SURGERY    . COLONOSCOPY WITH PROPOFOL N/A 05/22/2017   Procedure: COLONOSCOPY WITH PROPOFOL;  Surgeon: Manya Silvas, MD;  Location: [facility] ENDOSCOPY;  Service: Endoscopy;  Laterality: N/A;  . FRACTURE SURGERY    . KNEE ARTHROSCOPY    . LUMBAR LAMINECTOMY      Home Medications:  Allergies as of 05/05/2020      Reactions   Ibuprofen Itching   Indomethacin    Infliximab    Methotrexate Derivatives    Moexipril    Percocet [oxycodone-acetaminophen] Itching   Naprosyn [naproxen] Rash      Medication List       Accurate as of May 05, 2020  9:55 AM. If you have any questions, ask your nurse or doctor.        amLODipine 10 MG tablet Commonly known as: NORVASC Take 5 mg by mouth 2 (two) times daily.   Contour Test test strip Generic drug: glucose blood 1 each by Other route daily.   Cranberry 400 MG Caps Take by mouth.   glipiZIDE 10 MG tablet Commonly known as: GLUCOTROL Take 10 mg by mouth daily before breakfast.   hydrochlorothiazide 25 MG tablet Commonly known as: HYDRODIURIL Take 25 mg by mouth daily.   leflunomide 10 MG tablet Commonly known as: ARAVA Take 10 mg by mouth daily.  metFORMIN 500 MG 24 hr tablet Commonly known as: GLUCOPHAGE-XR Take 500 mg by mouth 2 (two) times daily.   metoprolol tartrate 50 MG tablet Commonly known as: LOPRESSOR Take 50 mg by mouth 2 (two) times daily.   omeprazole 40 MG capsule Commonly known as: PRILOSEC Take 40 mg by mouth daily.   potassium chloride SA 20 MEQ tablet Commonly known as: KLOR-CON Take 20 mEq by mouth daily.   rosuvastatin 10 MG tablet Commonly known as: CRESTOR Take by mouth.   tacrolimus 0.1 % ointment Commonly known as:  PROTOPIC Apply 1 application topically 2 (two) times daily as needed (rash).   tamsulosin 0.4 MG Caps capsule Commonly known as: FLOMAX TAKE 1 CAPSULE(0.4 MG) BY MOUTH DAILY   tiZANidine 4 MG tablet Commonly known as: Zanaflex Take 0.5-1 tablets (2-4 mg total) by mouth 2 (two) times daily as needed for muscle spasms.   vitamin B-12 1000 MCG tablet Commonly known as: CYANOCOBALAMIN Take 1,000 mcg by mouth daily.       Allergies:  Allergies  Allergen Reactions  . Ibuprofen Itching  . Indomethacin   . Infliximab   . Methotrexate Derivatives   . Moexipril   . Percocet [Oxycodone-Acetaminophen] Itching  . Naprosyn [Naproxen] Rash    Family History: Family History  Problem Relation Age of Onset  . Breast cancer Neg Hx     Social History:  reports that she has never smoked. She has never used smokeless tobacco. She reports that she does not drink alcohol and does not use drugs.  ROS: For pertinent review of systems please refer to history of present illness  Physical Exam: BP (!) 169/72   Pulse 91   Ht 5\' 6"  (1.676 m)   Wt 170 lb (77.1 kg)   BMI 27.44 kg/m   Constitutional:  Well nourished. Alert and oriented, No acute distress. HEENT: [place] AT, moist mucus membranes.  Trachea midline, no masses. Cardiovascular: No clubbing, cyanosis, or edema. Respiratory: Normal respiratory effort, no increased work of breathing. Skin: No rashes, bruises or suspicious lesions. Lymph: No cervical or inguinal adenopathy. Neurologic: Grossly intact, no focal deficits, moving all 4 extremities. Psychiatric: Normal mood and affect.   Laboratory Data:  Lab Results  Component Value Date   CREATININE 0.90 10/16/2019    I have reviewed the labs.  Pertinent Imaging Results for orders placed or performed in visit on 05/05/20  Bladder Scan (Post Void Residual) in office  Result Value Ref Range   Scan Result 402      Assessment & Plan:    1.  Incomplete bladder emptying Patient  will continue tamsulosin 0.8 mg daily Patient will continue self caths to twice daily   2. Vaginal atrophy Continue applying the vaginal estrogen cream 3 nights weekly Patient does not need a refill at this time  3. rUTI's Patient is asymptomatic   4. Malodorous urine Explained that the bad odor from the urine is the result of her incomplete emptying and we will increase her self cathing to twice daily and this should improve and it is not a sign of bladder infection  Patient still has an odor but feels as though this is related to urinary rentention and not bladder infection.   I discussed increasing self caths to TID but patient deferred and would like to continue self caths BID.                           Follow up  in 6 months around (10/2019).          Return for Keep follow up in January.  These notes generated with voice recognition software. I apologize for typographical errors.  Fransico Him, am acting as a scribe for Royden Purl,  [place] [address] [place], [place] [address] [phone]  I have reviewed the above documentation for accuracy and completeness, and I agree with the above.    Zara Council, PA-C

## 2020-05-13 ENCOUNTER — Other Ambulatory Visit: Payer: Self-pay

## 2020-05-13 ENCOUNTER — Ambulatory Visit (INDEPENDENT_AMBULATORY_CARE_PROVIDER_SITE_OTHER): Payer: Medicare Other | Admitting: *Deleted

## 2020-05-13 ENCOUNTER — Telehealth: Payer: Self-pay | Admitting: Urology

## 2020-05-13 ENCOUNTER — Other Ambulatory Visit: Payer: Self-pay | Admitting: *Deleted

## 2020-05-13 ENCOUNTER — Other Ambulatory Visit: Payer: Self-pay | Admitting: Urology

## 2020-05-13 DIAGNOSIS — N390 Urinary tract infection, site not specified: Secondary | ICD-10-CM

## 2020-05-13 LAB — URINALYSIS, COMPLETE
Bilirubin, UA: NEGATIVE
Ketones, UA: NEGATIVE
Nitrite, UA: NEGATIVE
Specific Gravity, UA: >1.03 — ABNORMAL HIGH (ref 1.005–1.030)
Urobilinogen, Ur: 0.2 mg/dL (ref 0.2–1.0)
pH, UA: 5.5 (ref 5.0–7.5)

## 2020-05-13 LAB — MICROSCOPIC EXAMINATION
RBC, Urine: >30 /hpf — AB (ref 0–2)
WBC, UA: >30 /hpf — AB (ref 0–5)

## 2020-05-13 MED ORDER — NITROFURANTOIN MONOHYD MACRO 100 MG PO CAPS
100.0000 mg | ORAL_CAPSULE | Freq: Two times a day (BID) | ORAL | 0 refills | Status: DC
Start: 2020-05-13 — End: 2020-05-20

## 2020-05-13 NOTE — Telephone Encounter (Signed)
Her UA looks grossly infected.  Please send in a Macrobid 100 mg, BID x 7 days.

## 2020-05-13 NOTE — Progress Notes (Signed)
In and Out Catheterization  Patient is present today for a I & O catheterization due to dysuria. Patient was cleaned and prepped in a sterile fashion with betadine . A 14FR cath was inserted no complications were noted , 50ml of urine return was noted, urine was yellow in color. A clean urine sample was collected for UA. Bladder was drained  And catheter was removed with out difficulty.    Performed by: Lanett Lasorsa,CMA    

## 2020-05-13 NOTE — Telephone Encounter (Signed)
Patient informed, RX sent in, voiced understanding.

## 2020-05-16 ENCOUNTER — Other Ambulatory Visit: Payer: Self-pay | Admitting: Student in an Organized Health Care Education/Training Program

## 2020-05-16 DIAGNOSIS — G894 Chronic pain syndrome: Secondary | ICD-10-CM

## 2020-05-20 ENCOUNTER — Telehealth: Payer: Self-pay | Admitting: Family Medicine

## 2020-05-20 LAB — CULTURE, URINE COMPREHENSIVE

## 2020-05-20 MED ORDER — AMOXICILLIN-POT CLAVULANATE 875-125 MG PO TABS
1.0000 | ORAL_TABLET | Freq: Two times a day (BID) | ORAL | 0 refills | Status: DC
Start: 2020-05-20 — End: 2020-06-17

## 2020-05-20 NOTE — Telephone Encounter (Signed)
-----   Message from Nori Riis, PA-C sent at 05/20/2020  8:02 AM EDT ----- Please let Mrs. Harriss that her urine culture did return positive for infection.  She has 2 different bacteria, so we need to change her to Augmentin 875/125, twice daily x7 days and discontinue the Macrobid.

## 2020-05-20 NOTE — Telephone Encounter (Signed)
Patient notified and voiced understanding. ABX sent to pharmacy.  

## 2020-05-24 NOTE — Telephone Encounter (Signed)
Called patient to review PP. No answer, left message to confirm appt and to review her procedure. Instructed to call back.

## 2020-05-25 ENCOUNTER — Other Ambulatory Visit: Payer: Self-pay

## 2020-05-25 ENCOUNTER — Ambulatory Visit
Payer: Medicare Other | Attending: Student in an Organized Health Care Education/Training Program | Admitting: Student in an Organized Health Care Education/Training Program

## 2020-05-25 ENCOUNTER — Telehealth: Payer: Self-pay | Admitting: Family Medicine

## 2020-05-25 DIAGNOSIS — G8929 Other chronic pain: Secondary | ICD-10-CM

## 2020-05-25 DIAGNOSIS — M533 Sacrococcygeal disorders, not elsewhere classified: Secondary | ICD-10-CM | POA: Diagnosis not present

## 2020-05-25 DIAGNOSIS — G894 Chronic pain syndrome: Secondary | ICD-10-CM

## 2020-05-25 DIAGNOSIS — M5416 Radiculopathy, lumbar region: Secondary | ICD-10-CM | POA: Diagnosis not present

## 2020-05-25 DIAGNOSIS — Z981 Arthrodesis status: Secondary | ICD-10-CM | POA: Diagnosis not present

## 2020-05-25 NOTE — Telephone Encounter (Signed)
LMOM for patient to return call and schedule a lab appointment per [facility] for a UA to check for hematuria.

## 2020-05-25 NOTE — Progress Notes (Signed)
Patient: Stephanie [name]  Service Category: E/M  Provider: Gillis Santa, MD  DOB: November 11, 1940  DOS: 05/25/2020  Location: Office  MRN: [id number]  Setting: Ambulatory outpatient  Referring Provider: Rusty Aus, MD  Type: Established Patient  Specialty: Interventional Pain Management  PCP: Rusty Aus, MD  Location: Home  Delivery: TeleHealth     Virtual Encounter - Pain Management PROVIDER NOTE: Information contained herein reflects review and annotations entered in association with encounter. Interpretation of such information and data should be left to medically-trained personnel. Information provided to patient can be located elsewhere in the medical record under "Patient Instructions". Document created using STT-dictation technology, any transcriptional errors that may result from process are unintentional.    Contact & Pharmacy Preferred: [phone] Home: [phone] (home) Mobile: [phone] (mobile) E-mail: No e-mail address on record  [place], [place] [address] Phone: [phone] Fax: [phone]   Pre-screening  Stephanie [name] offered "in-person" vs "virtual" encounter. She indicated preferring virtual for this encounter.   Reason COVID-19*  Social distancing based on CDC and AMA recommendations.   I contacted Stephanie [name] on 05/25/2020 via video conference.      I clearly identified myself as Gillis Santa, MD. I verified that I was speaking with the correct person using two identifiers (Name: Stephanie [name], and date of birth: 1941/05/28).  Consent I sought verbal advanced consent from Stephanie [name] for virtual visit interactions. I informed Stephanie [name] of possible security and privacy concerns, risks, and limitations associated with providing "not-in-person" medical evaluation and management services. I also informed Stephanie [name] of the availability of "in-person" appointments.  Finally, I informed her that there would be a charge for the virtual visit and that she could be  personally, fully or partially, financially responsible for it. Stephanie [name] expressed understanding and agreed to proceed.   Historic Elements   Stephanie [name] is a 79 y.o. year old, female patient evaluated today after her last contact with our practice on 05/16/2020. Stephanie [name]  has a past medical history of Arthritis, Asthma, Cancer ([place]) (1996), Diabetes mellitus without complication ([place]), GERD (gastroesophageal reflux disease), Glaucoma, Hyperlipidemia, Hypertension, Lumbar disc disease, Small bowel obstruction ([place]), and Vitamin B 12 deficiency. She also  has a past surgical history that includes Back surgery; Colon surgery; Fracture surgery; Abdominal hysterectomy; Knee arthroscopy; Lumbar laminectomy; and Colonoscopy with propofol (N/A, 05/22/2017). Stephanie [name] has a current medication list which includes the following prescription(s): amlodipine, amoxicillin-clavulanate, contour test, cranberry, estradiol, glipizide, hydrochlorothiazide, leflunomide, metformin, metoprolol tartrate, omeprazole, potassium chloride sa, probiotic product, rosuvastatin, tacrolimus, tamsulosin, and vitamin b-12. She  reports that she has never smoked. She has never used smokeless tobacco. She reports that she does not drink alcohol and does not use drugs. Stephanie [name] is allergic to ibuprofen, indomethacin, infliximab, methotrexate derivatives, moexipril, percocet [oxycodone-acetaminophen], and naprosyn [naproxen].   HPI  Today, she is being contacted for a post-procedure assessment.  Post-Procedure Evaluation  Procedure (05/16/2020):   Type: Diagnostic Sacroiliac Joint Steroid Injection          Region: Inferior Lumbosacral Region Level: PIIS (Posterior Inferior Iliac Spine) Laterality: Left-Side  Sedation: Please see nurses note.  Effectiveness during initial hour after procedure(Ultra-Short Term Relief): 60 %   Local  anesthetic used: Long-acting (4-6 hours) Effectiveness: Defined as any analgesic benefit obtained secondary to the administration of local anesthetics.  This carries significant diagnostic value as to the etiological location, or anatomical origin, of the pain. Duration of benefit is expected to coincide with the duration of the local anesthetic used.  Effectiveness during initial 4-6 hours after procedure(Short-Term Relief): 60 %   Long-term benefit: Defined as any relief past the pharmacologic duration of the local anesthetics.  Effectiveness past the initial 6 hours after procedure(Long-Term Relief): 60 %   Current benefits: Defined as benefit that persist at this time.   Analgesia:  50% improved Function: Somewhat improved ROM: Somewhat improved   Laboratory Chemistry Profile   Renal Lab Results  Component Value Date   BUN 12 07/09/2019   CREATININE 0.90 10/16/2019   GFRAA >60 07/09/2019   GFRNONAA >60 07/09/2019     Hepatic Lab Results  Component Value Date   AST 23 07/05/2019   ALT 19 07/05/2019   ALBUMIN 4.0 07/05/2019   ALKPHOS 103 07/05/2019   LIPASE 19 07/05/2019     Electrolytes Lab Results  Component Value Date   NA 134 (L) 07/09/2019   K 3.2 (L) 07/09/2019   CL 98 07/09/2019   CALCIUM 8.6 (L) 07/09/2019   MG 2.3 07/08/2019     Bone No results found for: VD25OH, VD125OH2TOT, KG4010UV2, ZD6644IH4, 25OHVITD1, 25OHVITD2, 25OHVITD3, TESTOFREE, TESTOSTERONE   Inflammation (CRP: Acute Phase) (ESR: Chronic Phase) Lab Results  Component Value Date   ESRSEDRATE 21 06/23/2014   LATICACIDVEN 1.2 07/06/2019       Note: Above Lab results reviewed.   Assessment  The primary encounter diagnosis was Chronic left SI joint pain. Diagnoses of SI (sacroiliac) joint dysfunction (left), 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T9-Sacrum), Chronic radicular lumbar pain, and Chronic pain syndrome were also pertinent to this visit.  Plan of Care  Stephanie [name] has a current  medication list which includes the following long-term medication(s): amlodipine, glipizide, hydrochlorothiazide, leflunomide, metformin, metoprolol tartrate, omeprazole, potassium chloride sa, and rosuvastatin.  Significant pain relief after left SI joint injection performed on 04/27/2020.  Per Stephanie [name] as well as collateral from her husband, her pain is reduced and she has noticed functional improvement as well (she is able to complete some ADLs with less pain).  Repeat as needed.  Orders:  Orders Placed This Encounter  Procedures  . SACROILIAC JOINT INJECTION    Scheduling timeframe: (PRN procedure) Stephanie [name] will call when needed. Clinical indication: Low back pain, w/ or w/o groin pain. Sacroiliac joint pain Sedation: Usually done with sedation. (May be done without sedation if so desired by patient.) Requirements: NPO x 8 hrs.; Driver; Stop blood thinners.    Standing Status:   Standing    Number of Occurrences:   1    Standing Expiration Date:   11/25/2020    Scheduling Instructions:     Side: LEFT     Sedation: Patient's choice.    Order Specific Question:   Where will this procedure be performed?    Answer:   [facility]   Follow-up plan:   Return if symptoms worsen or fail to improve.     Left SI joint injection without sedation 04/27/2020: Helped significantly, repeat as needed    Recent Visits Date Type Provider Dept  04/27/20 Procedure visit Gillis Santa, [facility]  04/19/20 Office Visit Gillis Santa, MD [facility]  Showing recent visits within past 90 days and meeting all other requirements Today's Visits Date Type Provider Dept  05/25/20 Telemedicine Gillis Santa, MD [facility]  Showing today's visits and meeting all other requirements Future Appointments No visits were found meeting these conditions. Showing future appointments within next 90 days and meeting all other requirements  I discussed the assessment and  treatment plan with the patient. The patient was provided an opportunity to ask questions and all were answered. The patient agreed with the plan and demonstrated an understanding of the instructions.  Patient advised to call back or seek an in-person evaluation if the symptoms or condition worsens.  Duration of encounter: 30 minutes.  Note by: Gillis Santa, MD Date: 05/25/2020; Time: 3:31 PM

## 2020-05-25 NOTE — Telephone Encounter (Signed)
-----   Message from Nori Riis, PA-C sent at 05/25/2020 12:35 PM EDT ----- She will need to provide a urine specimen in 3 weeks so that we can ensure her microscopic hematuria has cleared with the treatment of her infection.

## 2020-05-27 ENCOUNTER — Other Ambulatory Visit: Payer: Self-pay | Admitting: Urology

## 2020-06-14 ENCOUNTER — Other Ambulatory Visit: Payer: Medicare Other

## 2020-06-17 ENCOUNTER — Inpatient Hospital Stay
Admission: EM | Admit: 2020-06-17 | Discharge: 2020-07-10 | DRG: 853 | Disposition: A | Discharge status: Skilled Nursing Facility | Payer: Medicare Other | Attending: Internal Medicine | Admitting: Internal Medicine

## 2020-06-17 ENCOUNTER — Inpatient Hospital Stay: Payer: Medicare Other

## 2020-06-17 ENCOUNTER — Other Ambulatory Visit: Payer: Self-pay

## 2020-06-17 ENCOUNTER — Emergency Department: Payer: Medicare Other

## 2020-06-17 DIAGNOSIS — A4159 Other Gram-negative sepsis: Secondary | ICD-10-CM | POA: Diagnosis present

## 2020-06-17 DIAGNOSIS — I10 Essential (primary) hypertension: Secondary | ICD-10-CM | POA: Diagnosis not present

## 2020-06-17 DIAGNOSIS — U071 COVID-19: Secondary | ICD-10-CM | POA: Diagnosis not present

## 2020-06-17 DIAGNOSIS — E669 Obesity, unspecified: Secondary | ICD-10-CM | POA: Diagnosis present

## 2020-06-17 DIAGNOSIS — N186 End stage renal disease: Secondary | ICD-10-CM | POA: Diagnosis not present

## 2020-06-17 DIAGNOSIS — E871 Hypo-osmolality and hyponatremia: Secondary | ICD-10-CM | POA: Diagnosis not present

## 2020-06-17 DIAGNOSIS — I452 Bifascicular block: Secondary | ICD-10-CM | POA: Diagnosis present

## 2020-06-17 DIAGNOSIS — N179 Acute kidney failure, unspecified: Secondary | ICD-10-CM | POA: Diagnosis not present

## 2020-06-17 DIAGNOSIS — Z6833 Body mass index (BMI) 33.0-33.9, adult: Secondary | ICD-10-CM

## 2020-06-17 DIAGNOSIS — Z90710 Acquired absence of both cervix and uterus: Secondary | ICD-10-CM

## 2020-06-17 DIAGNOSIS — N183 Chronic kidney disease, stage 3 unspecified: Secondary | ICD-10-CM | POA: Diagnosis present

## 2020-06-17 DIAGNOSIS — E7800 Pure hypercholesterolemia, unspecified: Secondary | ICD-10-CM | POA: Diagnosis present

## 2020-06-17 DIAGNOSIS — N1330 Unspecified hydronephrosis: Secondary | ICD-10-CM | POA: Diagnosis present

## 2020-06-17 DIAGNOSIS — H409 Unspecified glaucoma: Secondary | ICD-10-CM | POA: Diagnosis present

## 2020-06-17 DIAGNOSIS — Z515 Encounter for palliative care: Secondary | ICD-10-CM

## 2020-06-17 DIAGNOSIS — Z1611 Resistance to penicillins: Secondary | ICD-10-CM | POA: Diagnosis present

## 2020-06-17 DIAGNOSIS — Z4901 Encounter for fitting and adjustment of extracorporeal dialysis catheter: Secondary | ICD-10-CM

## 2020-06-17 DIAGNOSIS — R54 Age-related physical debility: Secondary | ICD-10-CM | POA: Diagnosis present

## 2020-06-17 DIAGNOSIS — K5651 Intestinal adhesions [bands], with partial obstruction: Secondary | ICD-10-CM | POA: Diagnosis present

## 2020-06-17 DIAGNOSIS — E872 Acidosis, unspecified: Secondary | ICD-10-CM | POA: Diagnosis present

## 2020-06-17 DIAGNOSIS — I4891 Unspecified atrial fibrillation: Secondary | ICD-10-CM | POA: Diagnosis not present

## 2020-06-17 DIAGNOSIS — Z789 Other specified health status: Secondary | ICD-10-CM

## 2020-06-17 DIAGNOSIS — N3020 Other chronic cystitis without hematuria: Secondary | ICD-10-CM | POA: Diagnosis present

## 2020-06-17 DIAGNOSIS — N185 Chronic kidney disease, stage 5: Secondary | ICD-10-CM

## 2020-06-17 DIAGNOSIS — Z79899 Other long term (current) drug therapy: Secondary | ICD-10-CM | POA: Diagnosis not present

## 2020-06-17 DIAGNOSIS — D631 Anemia in chronic kidney disease: Secondary | ICD-10-CM | POA: Diagnosis present

## 2020-06-17 DIAGNOSIS — Z981 Arthrodesis status: Secondary | ICD-10-CM

## 2020-06-17 DIAGNOSIS — K56609 Unspecified intestinal obstruction, unspecified as to partial versus complete obstruction: Secondary | ICD-10-CM

## 2020-06-17 DIAGNOSIS — A419 Sepsis, unspecified organism: Secondary | ICD-10-CM

## 2020-06-17 DIAGNOSIS — E1122 Type 2 diabetes mellitus with diabetic chronic kidney disease: Secondary | ICD-10-CM | POA: Diagnosis not present

## 2020-06-17 DIAGNOSIS — Z87440 Personal history of urinary (tract) infections: Secondary | ICD-10-CM

## 2020-06-17 DIAGNOSIS — I2510 Atherosclerotic heart disease of native coronary artery without angina pectoris: Secondary | ICD-10-CM | POA: Diagnosis present

## 2020-06-17 DIAGNOSIS — Z66 Do not resuscitate: Secondary | ICD-10-CM | POA: Diagnosis present

## 2020-06-17 DIAGNOSIS — M0579 Rheumatoid arthritis with rheumatoid factor of multiple sites without organ or systems involvement: Secondary | ICD-10-CM | POA: Diagnosis present

## 2020-06-17 DIAGNOSIS — K567 Ileus, unspecified: Secondary | ICD-10-CM | POA: Diagnosis not present

## 2020-06-17 DIAGNOSIS — Z8679 Personal history of other diseases of the circulatory system: Secondary | ICD-10-CM | POA: Diagnosis not present

## 2020-06-17 DIAGNOSIS — R319 Hematuria, unspecified: Secondary | ICD-10-CM | POA: Diagnosis not present

## 2020-06-17 DIAGNOSIS — I480 Paroxysmal atrial fibrillation: Secondary | ICD-10-CM

## 2020-06-17 DIAGNOSIS — N12 Tubulo-interstitial nephritis, not specified as acute or chronic: Secondary | ICD-10-CM

## 2020-06-17 DIAGNOSIS — J189 Pneumonia, unspecified organism: Secondary | ICD-10-CM | POA: Diagnosis present

## 2020-06-17 DIAGNOSIS — I471 Supraventricular tachycardia, unspecified: Secondary | ICD-10-CM

## 2020-06-17 DIAGNOSIS — E1169 Type 2 diabetes mellitus with other specified complication: Secondary | ICD-10-CM | POA: Diagnosis present

## 2020-06-17 DIAGNOSIS — Z7984 Long term (current) use of oral hypoglycemic drugs: Secondary | ICD-10-CM

## 2020-06-17 DIAGNOSIS — R140 Abdominal distension (gaseous): Secondary | ICD-10-CM | POA: Diagnosis not present

## 2020-06-17 DIAGNOSIS — N170 Acute kidney failure with tubular necrosis: Secondary | ICD-10-CM | POA: Diagnosis present

## 2020-06-17 DIAGNOSIS — T380X5A Adverse effect of glucocorticoids and synthetic analogues, initial encounter: Secondary | ICD-10-CM | POA: Diagnosis present

## 2020-06-17 DIAGNOSIS — R6520 Severe sepsis without septic shock: Secondary | ICD-10-CM | POA: Diagnosis present

## 2020-06-17 DIAGNOSIS — N10 Acute tubulo-interstitial nephritis: Secondary | ICD-10-CM | POA: Diagnosis present

## 2020-06-17 DIAGNOSIS — R600 Localized edema: Secondary | ICD-10-CM | POA: Diagnosis not present

## 2020-06-17 DIAGNOSIS — I120 Hypertensive chronic kidney disease with stage 5 chronic kidney disease or end stage renal disease: Secondary | ICD-10-CM | POA: Diagnosis present

## 2020-06-17 DIAGNOSIS — D62 Acute posthemorrhagic anemia: Secondary | ICD-10-CM | POA: Diagnosis not present

## 2020-06-17 DIAGNOSIS — G894 Chronic pain syndrome: Secondary | ICD-10-CM | POA: Diagnosis present

## 2020-06-17 DIAGNOSIS — M069 Rheumatoid arthritis, unspecified: Secondary | ICD-10-CM | POA: Diagnosis present

## 2020-06-17 DIAGNOSIS — T508X5A Adverse effect of diagnostic agents, initial encounter: Secondary | ICD-10-CM | POA: Diagnosis not present

## 2020-06-17 DIAGNOSIS — Z886 Allergy status to analgesic agent status: Secondary | ICD-10-CM

## 2020-06-17 DIAGNOSIS — J9811 Atelectasis: Secondary | ICD-10-CM | POA: Diagnosis present

## 2020-06-17 DIAGNOSIS — Z885 Allergy status to narcotic agent status: Secondary | ICD-10-CM

## 2020-06-17 DIAGNOSIS — Z992 Dependence on renal dialysis: Secondary | ICD-10-CM | POA: Diagnosis not present

## 2020-06-17 DIAGNOSIS — E1165 Type 2 diabetes mellitus with hyperglycemia: Secondary | ICD-10-CM | POA: Diagnosis present

## 2020-06-17 DIAGNOSIS — E119 Type 2 diabetes mellitus without complications: Secondary | ICD-10-CM

## 2020-06-17 DIAGNOSIS — Z888 Allergy status to other drugs, medicaments and biological substances status: Secondary | ICD-10-CM

## 2020-06-17 DIAGNOSIS — D6859 Other primary thrombophilia: Secondary | ICD-10-CM | POA: Diagnosis present

## 2020-06-17 DIAGNOSIS — I4819 Other persistent atrial fibrillation: Secondary | ICD-10-CM | POA: Diagnosis not present

## 2020-06-17 DIAGNOSIS — Z85038 Personal history of other malignant neoplasm of large intestine: Secondary | ICD-10-CM

## 2020-06-17 DIAGNOSIS — D84821 Immunodeficiency due to drugs: Secondary | ICD-10-CM | POA: Diagnosis present

## 2020-06-17 DIAGNOSIS — Z7952 Long term (current) use of systemic steroids: Secondary | ICD-10-CM

## 2020-06-17 DIAGNOSIS — Z7901 Long term (current) use of anticoagulants: Secondary | ICD-10-CM

## 2020-06-17 DIAGNOSIS — D6869 Other thrombophilia: Secondary | ICD-10-CM | POA: Diagnosis not present

## 2020-06-17 DIAGNOSIS — E876 Hypokalemia: Secondary | ICD-10-CM | POA: Diagnosis not present

## 2020-06-17 DIAGNOSIS — Z79890 Hormone replacement therapy (postmenopausal): Secondary | ICD-10-CM

## 2020-06-17 DIAGNOSIS — D696 Thrombocytopenia, unspecified: Secondary | ICD-10-CM | POA: Diagnosis present

## 2020-06-17 DIAGNOSIS — Z0189 Encounter for other specified special examinations: Secondary | ICD-10-CM

## 2020-06-17 DIAGNOSIS — Z881 Allergy status to other antibiotic agents status: Secondary | ICD-10-CM

## 2020-06-17 DIAGNOSIS — D72829 Elevated white blood cell count, unspecified: Secondary | ICD-10-CM | POA: Diagnosis not present

## 2020-06-17 DIAGNOSIS — Z4659 Encounter for fitting and adjustment of other gastrointestinal appliance and device: Secondary | ICD-10-CM

## 2020-06-17 DIAGNOSIS — Z7189 Other specified counseling: Secondary | ICD-10-CM | POA: Diagnosis not present

## 2020-06-17 DIAGNOSIS — E8809 Other disorders of plasma-protein metabolism, not elsewhere classified: Secondary | ICD-10-CM | POA: Diagnosis not present

## 2020-06-17 DIAGNOSIS — Z95828 Presence of other vascular implants and grafts: Secondary | ICD-10-CM

## 2020-06-17 DIAGNOSIS — K219 Gastro-esophageal reflux disease without esophagitis: Secondary | ICD-10-CM | POA: Diagnosis present

## 2020-06-17 DIAGNOSIS — N141 Nephropathy induced by other drugs, medicaments and biological substances: Secondary | ICD-10-CM | POA: Diagnosis not present

## 2020-06-17 DIAGNOSIS — R739 Hyperglycemia, unspecified: Secondary | ICD-10-CM | POA: Diagnosis present

## 2020-06-17 DIAGNOSIS — K565 Intestinal adhesions [bands], unspecified as to partial versus complete obstruction: Secondary | ICD-10-CM | POA: Diagnosis not present

## 2020-06-17 DIAGNOSIS — L89321 Pressure ulcer of left buttock, stage 1: Secondary | ICD-10-CM | POA: Diagnosis present

## 2020-06-17 DIAGNOSIS — E782 Mixed hyperlipidemia: Secondary | ICD-10-CM | POA: Diagnosis present

## 2020-06-17 DIAGNOSIS — L8990 Pressure ulcer of unspecified site, unspecified stage: Secondary | ICD-10-CM | POA: Diagnosis present

## 2020-06-17 LAB — BASIC METABOLIC PANEL
Anion gap: 13 (ref 5–15)
BUN: 28 mg/dL — ABNORMAL HIGH (ref 8–23)
CO2: 20 mmol/L — ABNORMAL LOW (ref 22–32)
Calcium: 8.4 mg/dL — ABNORMAL LOW (ref 8.9–10.3)
Chloride: 102 mmol/L (ref 98–111)
Creatinine, Ser: 1.37 mg/dL — ABNORMAL HIGH (ref 0.44–1.00)
GFR calc Af Amer: 43 mL/min — ABNORMAL LOW (ref >60)
GFR calc non Af Amer: 37 mL/min — ABNORMAL LOW (ref >60)
Glucose, Bld: 203 mg/dL — ABNORMAL HIGH (ref 70–99)
Potassium: 3.3 mmol/L — ABNORMAL LOW (ref 3.5–5.1)
Sodium: 135 mmol/L (ref 135–145)

## 2020-06-17 LAB — COMPREHENSIVE METABOLIC PANEL
ALT: 21 U/L (ref 0–44)
AST: 27 U/L (ref 15–41)
Albumin: 3 g/dL — ABNORMAL LOW (ref 3.5–5.0)
Alkaline Phosphatase: 83 U/L (ref 38–126)
Anion gap: 13 (ref 5–15)
BUN: 38 mg/dL — ABNORMAL HIGH (ref 8–23)
CO2: 18 mmol/L — ABNORMAL LOW (ref 22–32)
Calcium: 8.9 mg/dL (ref 8.9–10.3)
Chloride: 102 mmol/L (ref 98–111)
Creatinine, Ser: 1.57 mg/dL — ABNORMAL HIGH (ref 0.44–1.00)
GFR calc Af Amer: 36 mL/min — ABNORMAL LOW (ref >60)
GFR calc non Af Amer: 31 mL/min — ABNORMAL LOW (ref >60)
Glucose, Bld: 344 mg/dL — ABNORMAL HIGH (ref 70–99)
Potassium: 3.9 mmol/L (ref 3.5–5.1)
Sodium: 133 mmol/L — ABNORMAL LOW (ref 135–145)
Total Bilirubin: 1 mg/dL (ref 0.3–1.2)
Total Protein: 6.2 g/dL — ABNORMAL LOW (ref 6.5–8.1)

## 2020-06-17 LAB — URINALYSIS, COMPLETE (UACMP) WITH MICROSCOPIC
Bilirubin Urine: NEGATIVE
Glucose, UA: >=500 mg/dL — AB
Ketones, ur: NEGATIVE mg/dL
Nitrite: NEGATIVE
Protein, ur: >=300 mg/dL — AB
RBC / HPF: >50 RBC/hpf — ABNORMAL HIGH (ref 0–5)
Specific Gravity, Urine: 1.013 (ref 1.005–1.030)
WBC, UA: >50 WBC/hpf — ABNORMAL HIGH (ref 0–5)
pH: 5 (ref 5.0–8.0)

## 2020-06-17 LAB — BLOOD CULTURE ID PANEL (REFLEXED) - BCID2

## 2020-06-17 LAB — LACTIC ACID, PLASMA
Lactic Acid, Venous: 3.1 mmol/L (ref 0.5–1.9)
Lactic Acid, Venous: 2.5 mmol/L (ref 0.5–1.9)
Lactic Acid, Venous: 2.8 mmol/L (ref 0.5–1.9)
Lactic Acid, Venous: 1.6 mmol/L (ref 0.5–1.9)

## 2020-06-17 LAB — CBC WITH DIFFERENTIAL/PLATELET
Abs Immature Granulocytes: 1.46 10*3/uL — ABNORMAL HIGH (ref 0.00–0.07)
Basophils Absolute: 0.1 10*3/uL (ref 0.0–0.1)
Basophils Relative: 0 %
Eosinophils Absolute: 0.1 10*3/uL (ref 0.0–0.5)
Eosinophils Relative: 0 %
HCT: 34.2 % — ABNORMAL LOW (ref 36.0–46.0)
Hemoglobin: 11.8 g/dL — ABNORMAL LOW (ref 12.0–15.0)
Immature Granulocytes: 7 %
Lymphocytes Relative: 2 %
Lymphs Abs: 0.3 10*3/uL — ABNORMAL LOW (ref 0.7–4.0)
MCH: 28.4 pg (ref 26.0–34.0)
MCHC: 34.5 g/dL (ref 30.0–36.0)
MCV: 82.4 fL (ref 80.0–100.0)
Monocytes Absolute: 0.7 10*3/uL (ref 0.1–1.0)
Monocytes Relative: 3 %
Neutro Abs: 17.8 10*3/uL — ABNORMAL HIGH (ref 1.7–7.7)
Neutrophils Relative %: 88 %
Platelets: 144 10*3/uL — ABNORMAL LOW (ref 150–400)
RBC: 4.15 MIL/uL (ref 3.87–5.11)
RDW: 14.1 % (ref 11.5–15.5)
Smear Review: NORMAL
WBC: 20.4 10*3/uL — ABNORMAL HIGH (ref 4.0–10.5)
nRBC: 0 % (ref 0.0–0.2)

## 2020-06-17 LAB — GLUCOSE, CAPILLARY
Glucose-Capillary: 313 mg/dL — ABNORMAL HIGH (ref 70–99)
Glucose-Capillary: 194 mg/dL — ABNORMAL HIGH (ref 70–99)
Glucose-Capillary: 135 mg/dL — ABNORMAL HIGH (ref 70–99)

## 2020-06-17 LAB — FIBRIN DERIVATIVES D-DIMER (ARMC ONLY): Fibrin derivatives D-dimer (ARMC): >7500 ng/mL (FEU) — ABNORMAL HIGH (ref 0.00–499.00)

## 2020-06-17 LAB — HIV ANTIBODY (ROUTINE TESTING W REFLEX): HIV Screen 4th Generation wRfx: NONREACTIVE

## 2020-06-17 LAB — PROCALCITONIN: Procalcitonin: 17.78 ng/mL

## 2020-06-17 LAB — MAGNESIUM: Magnesium: 1.8 mg/dL (ref 1.7–2.4)

## 2020-06-17 LAB — TROPONIN I (HIGH SENSITIVITY)
Troponin I (High Sensitivity): 61 ng/L — ABNORMAL HIGH (ref <18)
Troponin I (High Sensitivity): 71 ng/L — ABNORMAL HIGH (ref <18)

## 2020-06-17 MED ORDER — SODIUM CHLORIDE 0.9 % IV SOLN
2.0000 g | INTRAVENOUS | Status: DC
  Administered 2020-06-17 – 2020-06-19 (×3): 2 g via INTRAVENOUS
  Filled 2020-06-17: qty 2
  Filled 2020-06-17: qty 20
  Filled 2020-06-17: qty 2

## 2020-06-17 MED ORDER — ENOXAPARIN SODIUM 40 MG/0.4ML ~~LOC~~ SOLN
40.0000 mg | SUBCUTANEOUS | Status: DC
  Administered 2020-06-17 – 2020-06-18 (×2): 40 mg via SUBCUTANEOUS
  Filled 2020-06-17 (×2): qty 0.4

## 2020-06-17 MED ORDER — PREDNISONE 20 MG PO TABS: 40.0000 mg | ORAL_TABLET | Freq: Every day | ORAL | Status: DC

## 2020-06-17 MED ORDER — PROBIOTIC-10 PO CHEW: CHEWABLE_TABLET | Freq: Every day | ORAL | Status: DC

## 2020-06-17 MED ORDER — SODIUM CHLORIDE 0.9 % IV SOLN: 2.0000 g | INTRAVENOUS | Status: DC

## 2020-06-17 MED ORDER — ACETAMINOPHEN 500 MG PO TABS
1000.0000 mg | ORAL_TABLET | Freq: Once | ORAL | Status: AC
  Administered 2020-06-17: 1000 mg via ORAL
  Filled 2020-06-17: qty 2

## 2020-06-17 MED ORDER — AZITHROMYCIN 500 MG PO TABS
500.0000 mg | ORAL_TABLET | Freq: Once | ORAL | Status: AC
  Administered 2020-06-17: 500 mg via ORAL
  Filled 2020-06-17: qty 1

## 2020-06-17 MED ORDER — INSULIN ASPART 100 UNIT/ML ~~LOC~~ SOLN
0.0000 [IU] | Freq: Three times a day (TID) | SUBCUTANEOUS | Status: DC
  Administered 2020-06-17 – 2020-06-18 (×2): 3 [IU] via SUBCUTANEOUS
  Administered 2020-06-19: 4 [IU] via SUBCUTANEOUS
  Administered 2020-06-19: 7 [IU] via SUBCUTANEOUS
  Administered 2020-06-19 – 2020-06-21 (×5): 4 [IU] via SUBCUTANEOUS
  Administered 2020-06-21: 3 [IU] via SUBCUTANEOUS
  Administered 2020-06-21: 4 [IU] via SUBCUTANEOUS
  Administered 2020-06-21: 7 [IU] via SUBCUTANEOUS
  Administered 2020-06-22 – 2020-06-23 (×3): 3 [IU] via SUBCUTANEOUS
  Administered 2020-06-23: 4 [IU] via SUBCUTANEOUS
  Administered 2020-06-23: 3 [IU] via SUBCUTANEOUS
  Administered 2020-06-24: 4 [IU] via SUBCUTANEOUS
  Administered 2020-06-24: 3 [IU] via SUBCUTANEOUS
  Administered 2020-06-25: 16:00:00 4 [IU] via SUBCUTANEOUS
  Administered 2020-06-25: 12:00:00 7 [IU] via SUBCUTANEOUS
  Filled 2020-06-17 (×21): qty 1

## 2020-06-17 MED ORDER — ZINC SULFATE 220 (50 ZN) MG PO CAPS
220.0000 mg | ORAL_CAPSULE | Freq: Every day | ORAL | Status: DC
  Administered 2020-06-17 – 2020-07-10 (×16): 220 mg via ORAL
  Filled 2020-06-17 (×18): qty 1

## 2020-06-17 MED ORDER — LACTATED RINGERS IV BOLUS
1000.0000 mL | Freq: Once | INTRAVENOUS | Status: AC
  Administered 2020-06-17: 1000 mL via INTRAVENOUS

## 2020-06-17 MED ORDER — DILTIAZEM HCL 25 MG/5ML IV SOLN
25.0000 mg | Freq: Once | INTRAVENOUS | Status: AC
  Administered 2020-06-18: 25 mg via INTRAVENOUS

## 2020-06-17 MED ORDER — GUAIFENESIN-DM 100-10 MG/5ML PO SYRP
10.0000 mL | ORAL_SOLUTION | ORAL | Status: DC | PRN
  Administered 2020-06-17 – 2020-06-23 (×6): 10 mL via ORAL
  Filled 2020-06-17 (×9): qty 10

## 2020-06-17 MED ORDER — POTASSIUM CHLORIDE 10 MEQ/100ML IV SOLN
10.0000 meq | INTRAVENOUS | Status: DC
  Administered 2020-06-17 – 2020-06-18 (×3): 10 meq via INTRAVENOUS
  Filled 2020-06-17 (×2): qty 100

## 2020-06-17 MED ORDER — DILTIAZEM HCL 25 MG/5ML IV SOLN
10.0000 mg | Freq: Once | INTRAVENOUS | Status: AC
  Administered 2020-06-17: 10 mg via INTRAVENOUS

## 2020-06-17 MED ORDER — TAMSULOSIN HCL 0.4 MG PO CAPS
0.4000 mg | ORAL_CAPSULE | Freq: Every day | ORAL | Status: DC
  Administered 2020-06-17 – 2020-07-10 (×16): 0.4 mg via ORAL
  Filled 2020-06-17 (×16): qty 1

## 2020-06-17 MED ORDER — SODIUM CHLORIDE 0.9 % IV SOLN
100.0000 mg | Freq: Every day | INTRAVENOUS | Status: AC
  Administered 2020-06-18 – 2020-06-21 (×4): 100 mg via INTRAVENOUS
  Filled 2020-06-17: qty 20
  Filled 2020-06-17 (×2): qty 100
  Filled 2020-06-17: qty 20

## 2020-06-17 MED ORDER — ACETAMINOPHEN 325 MG PO TABS
650.0000 mg | ORAL_TABLET | Freq: Four times a day (QID) | ORAL | Status: DC | PRN
  Administered 2020-06-17 – 2020-07-09 (×12): 650 mg via ORAL
  Filled 2020-06-17 (×14): qty 2

## 2020-06-17 MED ORDER — INSULIN DETEMIR 100 UNIT/ML ~~LOC~~ SOLN
0.1000 [IU]/kg | Freq: Two times a day (BID) | SUBCUTANEOUS | Status: DC
  Administered 2020-06-17 – 2020-06-19 (×5): 8 [IU] via SUBCUTANEOUS
  Filled 2020-06-17 (×8): qty 0.08

## 2020-06-17 MED ORDER — SODIUM CHLORIDE 0.9 % IV SOLN: INTRAVENOUS | Status: DC

## 2020-06-17 MED ORDER — PANTOPRAZOLE SODIUM 40 MG PO TBEC
40.0000 mg | DELAYED_RELEASE_TABLET | Freq: Every day | ORAL | Status: DC
  Administered 2020-06-17 – 2020-06-24 (×8): 40 mg via ORAL
  Filled 2020-06-17 (×8): qty 1

## 2020-06-17 MED ORDER — SODIUM CHLORIDE 0.9 % IV SOLN: 500.0000 mg | INTRAVENOUS | Status: DC

## 2020-06-17 MED ORDER — SODIUM CHLORIDE 0.9% FLUSH
3.0000 mL | Freq: Two times a day (BID) | INTRAVENOUS | Status: DC
  Administered 2020-06-17 – 2020-07-03 (×28): 3 mL via INTRAVENOUS

## 2020-06-17 MED ORDER — SODIUM CHLORIDE 0.9 % IV SOLN
1.0000 g | Freq: Once | INTRAVENOUS | Status: AC
  Administered 2020-06-17: 1 g via INTRAVENOUS
  Filled 2020-06-17: qty 1

## 2020-06-17 MED ORDER — LACTATED RINGERS IV SOLN: INTRAVENOUS | Status: DC

## 2020-06-17 MED ORDER — AMLODIPINE BESYLATE 5 MG PO TABS
5.0000 mg | ORAL_TABLET | Freq: Two times a day (BID) | ORAL | Status: DC
  Administered 2020-06-17 – 2020-06-18 (×2): 5 mg via ORAL
  Filled 2020-06-17 (×2): qty 1

## 2020-06-17 MED ORDER — METOPROLOL TARTRATE 50 MG PO TABS
50.0000 mg | ORAL_TABLET | Freq: Two times a day (BID) | ORAL | Status: DC
  Administered 2020-06-17 – 2020-06-19 (×4): 50 mg via ORAL
  Filled 2020-06-17 (×4): qty 1

## 2020-06-17 MED ORDER — IOHEXOL 350 MG/ML SOLN
60.0000 mL | Freq: Once | INTRAVENOUS | Status: AC | PRN
  Administered 2020-06-17: 60 mL via INTRAVENOUS

## 2020-06-17 MED ORDER — SODIUM CHLORIDE 0.9% FLUSH
3.0000 mL | INTRAVENOUS | Status: DC | PRN
  Administered 2020-06-21 – 2020-06-22 (×3): 3 mL via INTRAVENOUS

## 2020-06-17 MED ORDER — RISAQUAD PO CAPS
1.0000 | ORAL_CAPSULE | Freq: Every day | ORAL | Status: DC
  Administered 2020-06-17 – 2020-06-24 (×8): 1 via ORAL
  Filled 2020-06-17 (×9): qty 1

## 2020-06-17 MED ORDER — BRIMONIDINE TARTRATE 0.2 % OP SOLN
1.0000 [drp] | Freq: Two times a day (BID) | OPHTHALMIC | Status: DC
  Administered 2020-06-17 – 2020-07-10 (×47): 1 [drp] via OPHTHALMIC
  Filled 2020-06-17 (×2): qty 5

## 2020-06-17 MED ORDER — ALBUTEROL SULFATE HFA 108 (90 BASE) MCG/ACT IN AERS
2.0000 | INHALATION_SPRAY | Freq: Four times a day (QID) | RESPIRATORY_TRACT | Status: DC
  Administered 2020-06-17 – 2020-06-18 (×3): 2 via RESPIRATORY_TRACT
  Filled 2020-06-17: qty 6.7

## 2020-06-17 MED ORDER — SODIUM CHLORIDE 0.9 % IV SOLN
250.0000 mL | INTRAVENOUS | Status: DC | PRN
  Administered 2020-06-21 – 2020-06-22 (×3): 250 mL via INTRAVENOUS

## 2020-06-17 MED ORDER — VANCOMYCIN HCL IN DEXTROSE 1-5 GM/200ML-% IV SOLN
1000.0000 mg | Freq: Once | INTRAVENOUS | Status: AC
  Administered 2020-06-17: 1000 mg via INTRAVENOUS
  Filled 2020-06-17: qty 200

## 2020-06-17 MED ORDER — ROSUVASTATIN CALCIUM 10 MG PO TABS
10.0000 mg | ORAL_TABLET | Freq: Every day | ORAL | Status: DC
  Administered 2020-06-17 – 2020-06-24 (×8): 10 mg via ORAL
  Filled 2020-06-17 (×9): qty 1

## 2020-06-17 MED ORDER — HYDROCORTISONE NA SUCCINATE PF 100 MG IJ SOLR
100.0000 mg | Freq: Once | INTRAMUSCULAR | Status: AC
  Administered 2020-06-17: 100 mg via INTRAVENOUS
  Filled 2020-06-17: qty 2

## 2020-06-17 MED ORDER — ASCORBIC ACID 500 MG PO TABS
500.0000 mg | ORAL_TABLET | Freq: Every day | ORAL | Status: DC
  Administered 2020-06-17 – 2020-07-10 (×16): 500 mg via ORAL
  Filled 2020-06-17 (×16): qty 1

## 2020-06-17 MED ORDER — INSULIN ASPART 100 UNIT/ML ~~LOC~~ SOLN
0.0000 [IU] | Freq: Three times a day (TID) | SUBCUTANEOUS | Status: DC
  Administered 2020-06-17: 11 [IU] via SUBCUTANEOUS
  Administered 2020-06-17: 3 [IU] via SUBCUTANEOUS
  Filled 2020-06-17 (×2): qty 1

## 2020-06-17 MED ORDER — DILTIAZEM HCL 25 MG/5ML IV SOLN
10.0000 mg | Freq: Once | INTRAVENOUS | Status: DC
  Filled 2020-06-17: qty 5

## 2020-06-17 MED ORDER — ONDANSETRON HCL 4 MG/2ML IJ SOLN
4.0000 mg | Freq: Four times a day (QID) | INTRAMUSCULAR | Status: DC | PRN
  Administered 2020-06-17 – 2020-07-08 (×9): 4 mg via INTRAVENOUS
  Filled 2020-06-17 (×10): qty 2

## 2020-06-17 MED ORDER — SODIUM CHLORIDE 0.9 % IV SOLN
200.0000 mg | Freq: Once | INTRAVENOUS | Status: AC
  Administered 2020-06-17: 200 mg via INTRAVENOUS
  Filled 2020-06-17: qty 200

## 2020-06-17 MED ORDER — DILTIAZEM HCL-DEXTROSE 125-5 MG/125ML-% IV SOLN (PREMIX)
5.0000 mg/h | INTRAVENOUS | Status: DC
  Administered 2020-06-17: 5 mg/h via INTRAVENOUS
  Administered 2020-06-18: 15 mg/h via INTRAVENOUS
  Filled 2020-06-17 (×2): qty 125

## 2020-06-17 MED ORDER — VITAMIN B-12 1000 MCG PO TABS
1000.0000 ug | ORAL_TABLET | Freq: Every day | ORAL | Status: DC
  Administered 2020-06-17 – 2020-06-24 (×8): 1000 ug via ORAL
  Filled 2020-06-17 (×9): qty 1

## 2020-06-17 MED ORDER — MAGNESIUM SULFATE 2 GM/50ML IV SOLN
2.0000 g | Freq: Once | INTRAVENOUS | Status: AC
  Administered 2020-06-17: 2 g via INTRAVENOUS
  Filled 2020-06-17: qty 50

## 2020-06-17 NOTE — ED Notes (Signed)
Pt call bell answered want's eye drops but not scheduled att

## 2020-06-17 NOTE — ED Notes (Signed)
Pt difficult IV start and orders to place a IV team consult

## 2020-06-17 NOTE — ED Notes (Addendum)
Pt has had 3 runs of SVT ([id number]) within a 10-12 minute time span and converted back to NSR on her own- EDP Notified. New orders to follow. Pt asymptomatic during runs

## 2020-06-17 NOTE — ED Provider Notes (Signed)
-----------------------------------------   6:52 PM on 06/17/2020 -----------------------------------------  Patient is admitted to the hospitalist service however patient's heart rate has increased approximately 170 to 200 bpm.  On EKG it appears to be most consistent with A. fib with RVR with ST changes most consistent with demand ischemia.  Rate appears to be fluctuating between 170 and 210 bpm making SVT less likely.  We will dose 10 mg of diltiazem.  Patient's blood pressure currently 123 systolic.  We will continue to closely monitor.  Patient's heart rate back around 100 bpm after 2 doses of diltiazem.   Harvest Dark, MD 06/17/20 2330

## 2020-06-17 NOTE — Progress Notes (Signed)
CODE SEPSIS - PHARMACY COMMUNICATION  **Broad Spectrum Antibiotics should be administered within 1 hour of Sepsis diagnosis**  Time Code Sepsis Called/Page Received: 0550  Antibiotics Ordered: Cefepime, Vancomycin, zithromax  Time of 1st antibiotic administration: awaiting IV access  Additional action taken by pharmacy: Per nurse:  Pt difficult IV start and orders to place a IV team consult   If necessary, Name of Provider/Nurse Contacted: Julius Bowels ,PharmD Clinical Pharmacist  06/17/2020  6:02 AM

## 2020-06-17 NOTE — ED Notes (Signed)
This RN at bedside with Eugene Garnet, [place]. Primary RN did repeat EKG. This RN gave repeat EKG to ED MD to review. Primary RN trying to contact admitting MD. ED MD gave verbal order to this RN for 10mg  Ditiazem.

## 2020-06-17 NOTE — Progress Notes (Signed)
PHARMACY - PHYSICIAN COMMUNICATION CRITICAL VALUE ALERT - BLOOD CULTURE IDENTIFICATION (BCID)  Stephanie [name] is an 79 y.o. female who presented to [facility] on 06/17/2020 with a chief complaint of chills, fever  Assessment:  Recent positive COVID test.    CT renal stone with R hydronephrosis and stone.  Blood cultures with GNR, BCID = K. Pneumoniae with likely urinary source.   Name of physician (or Provider) Contacted: Dr Francine Graven  Current antibiotics: Ceftriaxone 2gm IV q24h   Changes to prescribed antibiotics recommended:  Patient is on recommended antibiotics - No changes needed  Results for orders placed or performed during the hospital encounter of 06/17/20  Blood Culture ID Panel (Reflexed) (Collected: 06/17/2020  3:57 AM)  Result Value Ref Range   Enterococcus faecalis NOT DETECTED NOT DETECTED   Enterococcus Faecium NOT DETECTED NOT DETECTED   Listeria monocytogenes NOT DETECTED NOT DETECTED   Staphylococcus species NOT DETECTED NOT DETECTED   Staphylococcus aureus (BCID) NOT DETECTED NOT DETECTED   Staphylococcus epidermidis NOT DETECTED NOT DETECTED   Staphylococcus lugdunensis NOT DETECTED NOT DETECTED   Streptococcus species NOT DETECTED NOT DETECTED   Streptococcus agalactiae NOT DETECTED NOT DETECTED   Streptococcus pneumoniae NOT DETECTED NOT DETECTED   Streptococcus pyogenes NOT DETECTED NOT DETECTED   A.calcoaceticus-baumannii NOT DETECTED NOT DETECTED   Bacteroides fragilis NOT DETECTED NOT DETECTED   Enterobacterales DETECTED (A) NOT DETECTED   Enterobacter cloacae complex NOT DETECTED NOT DETECTED   Escherichia coli NOT DETECTED NOT DETECTED   Klebsiella aerogenes NOT DETECTED NOT DETECTED   Klebsiella oxytoca NOT DETECTED NOT DETECTED   Klebsiella pneumoniae DETECTED (A) NOT DETECTED   Proteus species NOT DETECTED NOT DETECTED   Salmonella species NOT DETECTED NOT DETECTED   Serratia marcescens NOT DETECTED NOT DETECTED   Haemophilus influenzae NOT DETECTED  NOT DETECTED   Neisseria meningitidis NOT DETECTED NOT DETECTED   Pseudomonas aeruginosa NOT DETECTED NOT DETECTED   Stenotrophomonas maltophilia NOT DETECTED NOT DETECTED   Candida albicans NOT DETECTED NOT DETECTED   Candida auris NOT DETECTED NOT DETECTED   Candida glabrata NOT DETECTED NOT DETECTED   Candida krusei NOT DETECTED NOT DETECTED   Candida parapsilosis NOT DETECTED NOT DETECTED   Candida tropicalis NOT DETECTED NOT DETECTED   Cryptococcus neoformans/gattii NOT DETECTED NOT DETECTED   CTX-M ESBL NOT DETECTED NOT DETECTED   Carbapenem resistance IMP NOT DETECTED NOT DETECTED   Carbapenem resistance KPC NOT DETECTED NOT DETECTED   Carbapenem resistance NDM NOT DETECTED NOT DETECTED   Carbapenem resist OXA 48 LIKE NOT DETECTED NOT DETECTED   Carbapenem resistance VIM NOT DETECTED NOT DETECTED    Doreene Eland, PharmD, BCPS.   Work Cell: [phone] 06/17/2020 3:50 PM

## 2020-06-17 NOTE — Consult Note (Signed)
Remdesivir - Pharmacy Brief Note   O:  ALT: 21 CXR: Negative for pneumonia SpO2: Room air   A/P:  06/09/20 SARS-CoV-2 NAAT (+)  Remdesivir 200 mg IVPB once followed by 100 mg IVPB daily x 4 days.   Benita Gutter 06/17/2020 8:30 AM

## 2020-06-17 NOTE — ED Notes (Signed)
Agbata MD called (no answer) and messaged regarding increased HR (currently 193) and increased BP (199/116).  EKG run.

## 2020-06-17 NOTE — Consult Note (Signed)
Urology Consult  I have been asked to see the patient by Dr. Francine Graven, for evaluation and management of AKI and possible urosepsis.  Chief Complaint: Chills, weakness, shortness of breath  History of Present Illness: Stephanie [name] is a 79 y.o. 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 She has been admitted for management of sepsis of unknown origin.  Admission labs notable for UA with >50 WBCs/hpf, >50 RBCs/hpf, many bacteria, 11-20 squamous epithelial cells/lpf, WBC clumps, budding yeast, and no nitrites; WBC count 20.4; creatinine 1.57 (baseline 0.8-1.0); and lactate 3.1.  CT stone study this morning revealed increased right hydronephrosis over prior to the level of her known chronic right UPJ obstruction, bilateral perinephric stranding, an air-filled urinary bladder, and debris throughout the right renal collecting system and bladder.  Foley catheter in place today draining yellow urine with visible sediment.  Patient denies recent dysuria, urgency, frequency, flank pain, and gross hematuria. She has been self-cathing three times daily without difficulty and is not reusing her catheters.  Past Medical History:  Diagnosis Date  . Arthritis   . Asthma   . Cancer ([place]) 1996   colon  . Diabetes mellitus without complication ([place])   . GERD (gastroesophageal reflux disease)   . Glaucoma   . Hyperlipidemia   . Hypertension   . Lumbar disc disease   . Small bowel obstruction ([place])   . Vitamin B 12 deficiency     Past Surgical History:  Procedure Laterality Date  . ABDOMINAL HYSTERECTOMY    . BACK SURGERY    . COLON SURGERY    . COLONOSCOPY WITH PROPOFOL N/A  05/22/2017   Procedure: COLONOSCOPY WITH PROPOFOL;  Surgeon: Manya Silvas, MD;  Location: [facility] ENDOSCOPY;  Service: Endoscopy;  Laterality: N/A;  . FRACTURE SURGERY    . KNEE ARTHROSCOPY    . LUMBAR LAMINECTOMY      Home Medications:  Current Meds  Medication Sig  . amLODipine (NORVASC) 10 MG tablet Take 5 mg by mouth 2 (two) times daily.   . brimonidine (ALPHAGAN) 0.2 % ophthalmic solution Place 1 drop into both eyes 2 (two) times daily.  . Cranberry 400 MG CAPS Take by mouth.  . estradiol (ESTRACE) 0.1 MG/GM vaginal cream Place 1 Applicatorful vaginally at bedtime.  Marland Kitchen glipiZIDE (GLUCOTROL) 10 MG tablet Take 10 mg by mouth daily before breakfast.  . hydrochlorothiazide (HYDRODIURIL) 25 MG tablet Take 25 mg by mouth daily.  Marland Kitchen leflunomide (ARAVA) 10 MG tablet Take 10 mg by mouth daily.  . metFORMIN (GLUCOPHAGE-XR) 500 MG 24 hr tablet Take 500 mg by mouth 2 (two) times daily.  . metoprolol tartrate (LOPRESSOR) 50 MG tablet Take 50 mg by mouth 2 (two) times daily.  Marland Kitchen omeprazole (PRILOSEC) 40 MG capsule Take 40 mg by mouth daily.  . potassium chloride SA (K-DUR,KLOR-CON) 20 MEQ tablet Take 20 mEq by mouth daily.  . predniSONE (DELTASONE) 20 MG tablet Take 40 mg by mouth daily with breakfast.  . Probiotic Product (PROBIOTIC-10 PO) Take 1 Dose by mouth daily.   . rosuvastatin (CRESTOR) 10 MG tablet Take by mouth.  . tacrolimus (PROTOPIC) 0.1 % ointment Apply 1 application topically 2 (two) times daily as needed (rash).   . tamsulosin (FLOMAX) 0.4 MG  CAPS capsule TAKE 1 CAPSULE(0.4 MG) BY MOUTH DAILY (Patient taking differently: Take 0.4 mg by mouth daily. )  . vitamin B-12 (CYANOCOBALAMIN) 1000 MCG tablet Take 1,000 mcg by mouth daily.    Allergies:  Allergies  Allergen Reactions  . Ibuprofen Itching  . Indomethacin   . Infliximab   . Methotrexate Derivatives   . Moexipril   . Percocet [Oxycodone-Acetaminophen] Itching  . Naprosyn [Naproxen] Rash    Family History  Problem  Relation Age of Onset  . Breast cancer Neg Hx     Social History:  reports that she has never smoked. She has never used smokeless tobacco. She reports that she does not drink alcohol and does not use drugs.  ROS: A complete review of systems was performed.  All systems are negative except for pertinent findings as noted.  Physical Exam:  Vital signs in last 24 hours: Temp:  [98.1 F (36.7 C)-103.2 F (39.6 C)] 98.1 F (36.7 C) (09/17 0811) Pulse Rate:  [73-106] 75 (09/17 1230) Resp:  [18-28] 22 (09/17 1230) BP: (126-161)/(55-75) 150/69 (09/17 1230) SpO2:  [93 %-97 %] 94 % (09/17 1230) Weight:  [75.3 kg] 75.3 kg (09/17 0352) Constitutional:  Alert and oriented, ill appearing HEENT: [place] AT, moist mucus membranes Cardiovascular: No clubbing, cyanosis, or edema Respiratory: Normal respiratory effort, dry cough Neurologic: Grossly intact, no focal deficits, moving all 4 extremities Psychiatric: Normal mood and affect  Laboratory Data:  Recent Labs    06/17/20 0357  WBC 20.4*  HGB 11.8*  HCT 34.2*   Recent Labs    06/17/20 0357  NA 133*  K 3.9  CL 102  CO2 18*  GLUCOSE 344*  BUN 38*  CREATININE 1.57*  CALCIUM 8.9   Urinalysis    Component Value Date/Time   COLORURINE YELLOW (A) 06/17/2020 1101   APPEARANCEUR TURBID (A) 06/17/2020 1101   APPEARANCEUR Cloudy (A) 05/13/2020 1110   LABSPEC 1.013 06/17/2020 1101   LABSPEC 1.013 01/04/2015 1239   PHURINE 5.0 06/17/2020 1101   GLUCOSEU >=500 (A) 06/17/2020 1101   GLUCOSEU Negative 01/04/2015 1239   HGBUR SMALL (A) 06/17/2020 1101   BILIRUBINUR NEGATIVE 06/17/2020 1101   BILIRUBINUR Negative 05/13/2020 1110   BILIRUBINUR Negative 01/04/2015 1239   KETONESUR NEGATIVE 06/17/2020 1101   PROTEINUR >=300 (A) 06/17/2020 1101   NITRITE NEGATIVE 06/17/2020 1101   LEUKOCYTESUR MODERATE (A) 06/17/2020 1101   LEUKOCYTESUR 2+ 01/04/2015 1239   Results for orders placed or performed during the hospital encounter of 06/17/20    Blood culture (routine x 2)     Status: None (Preliminary result)   Collection Time: 06/17/20  3:57 AM   Specimen: BLOOD  Result Value Ref Range Status   Specimen Description BLOOD LEFT HAND  Final   Special Requests   Final    BOTTLES DRAWN AEROBIC AND ANAEROBIC Blood Culture adequate volume   Culture   Final    NO GROWTH <12 HOURS Performed at [facility], [address] [place], [place] [address]    Report Status PENDING  Incomplete  Blood culture (routine x 2)     Status: None (Preliminary result)   Collection Time: 06/17/20  3:57 AM   Specimen: BLOOD  Result Value Ref Range Status   Specimen Description BLOOD RIGHT [facility]  Final   Special Requests   Final    BOTTLES DRAWN AEROBIC AND ANAEROBIC Blood Culture adequate volume   Culture   Final    NO GROWTH <12 HOURS Performed at [facility], [place], [place] [address]    Report Status PENDING  Incomplete    Radiologic Imaging: CT RENAL STONE STUDY  Result Date: 06/17/2020 CLINICAL DATA:  Hydronephrosis, acute kidney injury EXAM: CT ABDOMEN AND PELVIS WITHOUT CONTRAST TECHNIQUE: Multidetector CT imaging of the abdomen and pelvis was performed following the standard protocol without IV contrast. COMPARISON:  CTA of the chest obtained earlier today; prior CT scan of the abdomen and pelvis 07/05/2019 FINDINGS: Lower chest: No acute abnormality. Small hiatal hernia. Atherosclerotic calcifications visualized along the coronary arteries. The heart is normal in size. Hepatobiliary: Relatively low attenuation of the hepatic parenchyma consistent with steatosis. No discrete hepatic lesion. High attenuation material within the gallbladder may represent sludge and/or small stones. Pancreas: Unremarkable. No pancreatic ductal dilatation or surrounding inflammatory changes. Spleen: Normal in size without focal abnormality. Adrenals/Urinary Tract: Normal adrenal glands. Nonspecific perinephric stranding  bilaterally. Moderate right-sided hydronephrosis terminating at the ureteropelvic junction is slightly progressed compared to prior imaging from October of 2020. Filling defects are present within the dependent portion of the renal collecting system. The ureter itself appears patent to the bladder. The bladder is distended and contains a large amount of air. Additionally, there is some debris layering dependently. Striated nephrogram throughout the right kidney favored to represent delayed nephrogram secondary to UPJ obstruction. Bilateral circumscribed low-attenuation renal lesions demonstrate no significant interval change in most likely represent simple and minimally complex renal cysts. Stomach/Bowel: No acute abnormality. Moderate rectal stool burden suggests constipation. Evidence of prior colonic and enteric surgery. No obstruction. Vascular/Lymphatic: Limited evaluation in the absence of intravenous contrast. Atherosclerotic vascular calcifications throughout the abdominal aorta and branch arteries. No suspicious lymphadenopathy. Reproductive: No acute abnormality. Other: No abdominal wall hernia or abnormality. No abdominopelvic ascites. Musculoskeletal: Advanced thoracolumbosacral posterior interbody fusion with multilevel screw and rod construct. No evidence of acute abnormality. IMPRESSION: 1. 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 2. Debris present dependently within the right renal collecting system and bladder. This may represent thrombus in the setting of hematuria, mucinous debris, or purulent debris if the patient has evidence of an active urinary tract infection. Ultimately, cystoscopy should be considered after resolution of the patient's acute symptoms to exclude the less likely possibility of a bladder wall mass. 3. Bladder distension with air/gas floating in the fundus. This may be secondary to recent instrumentation (catheter insertion), or be reflective of  infection with a gas-forming organism. Recommend correlation with urinalysis. 4. Nonspecific perinephric stranding bilaterally consistent with the history of acute kidney injury. 5. Moderate rectal stool burden consistent with constipation. 6. Small hiatal hernia. 7. Aortic and cardiac atherosclerotic vascular calcifications. Electronically Signed   By: Jacqulynn Cadet M.D.   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    Admission UA concerning for infection, however this is typical for her at baseline.  CT findings notable for increased right hydronephrosis and air-filled bladder consistent with self-catheterization versus emphysematous cystitis.  AKI likely secondary to increased right hydronephrosis.  Agree with broad-spectrum antibiotics and supportive care.  I recommend conservative management at this time given her active COVID-19 infection with close monitoring of her renal function; if creatinine fails to downtrend or she acutely decompensates, may consider right ureteral stent placement.  Recommendations: -Agree with antibiotics  and supportive care -Follow urine and blood cultures -Follow renal function -Continue Foley versus 3 times daily in and out catheterization -Consider repeat imaging in 2 to 3 days if renal function has not improved  Thank you for involving me in this patient's care, I will continue to follow along.  Debroah Loop, PA-C 06/17/2020 12:59 PM

## 2020-06-17 NOTE — ED Notes (Signed)
Patient transported to CT 

## 2020-06-17 NOTE — ED Notes (Signed)
Husband updated, reports brought clothes for pt, husband requests call in am prior to end of my shift

## 2020-06-17 NOTE — Sepsis Progress Note (Signed)
Notified bedside nurse of need to draw repeat lactic acid. 

## 2020-06-17 NOTE — ED Notes (Signed)
ED doc reviewed EKG, 10mg  of Diltiazem ordered, Dr. Francine Graven was notified.  Dr. Francine Graven will notify floor coverage.

## 2020-06-17 NOTE — ED Notes (Signed)
Pt has had her 5th run of SVT ( HR: 142-167) and converted on her own within 15 secs. EDP notified, see MAR for new orders

## 2020-06-17 NOTE — ED Triage Notes (Signed)
Pt to ED via EMS from home., Pt was dx with covid on the 12th, since then pt has been increasingly feeling worse. Pt is c/o weakness, fever, cough, sob. Pt arrives 96% on room air. Pt was given 500mg  tylenol prior to arrival, pt arrives with temp of 103.2. Pt states she has been fully vaccinated against covid.

## 2020-06-17 NOTE — H&P (Signed)
History and Physical    Stephanie [name]:[id number] DOB: 10-Mar-1941 DOA: 06/17/2020  PCP: Rusty Aus, MD   Patient coming from: Home  I have personally briefly reviewed patient's old medical records in [place]  Chief Complaint: Chills  HPI: Stephanie [name] is a 79 y.o. female with medical history significant for COVID-19 viral infection (positive PCR test June 09, 2020), history of diabetes mellitus, history of rheumatoid arthritis on chronic immunosuppressive therapy, hypertension and a remote history of colon cancer who was brought into the ER by EMS for evaluation of fever.  Patient had a T-max of 104F at home.  She complains of generalized weakness and worsening shortness of breath but denies having any nausea, no vomiting, no chest pain or abdominal pain.  Patient does self caths at home. Patient is fully vaccinated She was started on prednisone by her primary care provider for presumed COVID-19 bronchitis Labs show sodium 133, potassium 3.9, chloride 102, bicarb 18, glucose 344, BUN 38, creatinine 1.57, calcium 8.9, AST 27, ALT 21, troponin 61, lactic acid 3.1, procalcitonin 17, white count 20.4 with a left shift , hemoglobin 11.8, hematocrit 34.2, MCV 82.4, RDW 14, platelet count 144 CT angiogram of the chest showed no evidence of pulmonary embolism, severe right hydronephrosis worse since October 2020. Chest x-ray reviewed by me is negative for pneumonia Twelve-lead EKG reviewed by me shows sinus tachycardia with PACs   ED Course: Patient is a 79 year old female with a past medical history significant for rheumatoid arthritis on immunosuppressive therapy, history of diabetes mellitus, hypertension and chronic right-sided hydronephrosis, patient does self caths at home who was brought into the ER for evaluation of fever and chills.  She was recently diagnosed with COVID-19 viral infection and complains of worsening shortness of breath and weakness.  Chest x-ray is negative  for pneumonia and patient is not hypoxic.  Patient appears septic and received broad-spectrum antibiotic therapy in the emergency room.  Source of sepsis is unknown at this time but she will be admitted to the hospital for further evaluation     Review of Systems: As per HPI otherwise 10 point review of systems negative.    Past Medical History:  Diagnosis Date  . Arthritis   . Asthma   . Cancer ([place]) 1996   colon  . Diabetes mellitus without complication ([place])   . GERD (gastroesophageal reflux disease)   . Glaucoma   . Hyperlipidemia   . Hypertension   . Lumbar disc disease   . Small bowel obstruction ([place])   . Vitamin B 12 deficiency     Past Surgical History:  Procedure Laterality Date  . ABDOMINAL HYSTERECTOMY    . BACK SURGERY    . COLON SURGERY    . COLONOSCOPY WITH PROPOFOL N/A 05/22/2017   Procedure: COLONOSCOPY WITH PROPOFOL;  Surgeon: Manya Silvas, MD;  Location[facility] ENDOSCOPY;  Service: Endoscopy;  Laterality: N/A;  . FRACTURE SURGERY    . KNEE ARTHROSCOPY    . LUMBAR LAMINECTOMY       reports that she has never smoked. She has never used smokeless tobacco. She reports that she does not drink alcohol and does not use drugs.  Allergies  Allergen Reactions  . Ibuprofen Itching  . Indomethacin   . Infliximab   . Methotrexate Derivatives   . Moexipril   . Percocet [Oxycodone-Acetaminophen] Itching  . Naprosyn [Naproxen] Rash    Family History  Problem Relation Age of Onset  . Breast cancer Neg  Hx      Prior to Admission medications   Medication Sig Start Date End Date Taking? Authorizing Provider  amLODipine (NORVASC) 10 MG tablet Take 5 mg by mouth 2 (two) times daily.    Yes [provider]  brimonidine (ALPHAGAN) 0.2 % ophthalmic solution Place 1 drop into both eyes 2 (two) times daily. 05/31/20  Yes [provider]  Cranberry 400 MG CAPS Take by mouth. 03/24/20  Yes [provider]  estradiol (ESTRACE) 0.1 MG/GM  vaginal cream Place 1 Applicatorful vaginally at bedtime.   Yes [provider]  glipiZIDE (GLUCOTROL) 10 MG tablet Take 10 mg by mouth daily before breakfast.   Yes [provider]  hydrochlorothiazide (HYDRODIURIL) 25 MG tablet Take 25 mg by mouth daily.   Yes [provider]  leflunomide (ARAVA) 10 MG tablet Take 10 mg by mouth daily.   Yes [provider]  metFORMIN (GLUCOPHAGE-XR) 500 MG 24 hr tablet Take 500 mg by mouth 2 (two) times daily.   Yes [provider]  metoprolol tartrate (LOPRESSOR) 50 MG tablet Take 50 mg by mouth 2 (two) times daily.   Yes [provider]  omeprazole (PRILOSEC) 40 MG capsule Take 40 mg by mouth daily.   Yes [provider]  potassium chloride SA (K-DUR,KLOR-CON) 20 MEQ tablet Take 20 mEq by mouth daily.   Yes [provider]  predniSONE (DELTASONE) 20 MG tablet Take 40 mg by mouth daily with breakfast. 06/09/20 06/19/20 Yes [provider]  Probiotic Product (PROBIOTIC-10 PO) Take 1 Dose by mouth daily.    Yes [provider]  rosuvastatin (CRESTOR) 10 MG tablet Take by mouth. 03/24/20  Yes [provider]  tacrolimus (PROTOPIC) 0.1 % ointment Apply 1 application topically 2 (two) times daily as needed (rash).    Yes [provider]  tamsulosin (FLOMAX) 0.4 MG CAPS capsule TAKE 1 CAPSULE(0.4 MG) BY MOUTH DAILY Patient taking differently: Take 0.4 mg by mouth daily.  05/27/20  Yes McGowan, Larene Beach A, PA-C  vitamin B-12 (CYANOCOBALAMIN) 1000 MCG tablet Take 1,000 mcg by mouth daily.   Yes [provider]  CONTOUR TEST test strip 1 each by Other route daily.  03/09/18   [provider]    Physical Exam: Vitals:   06/17/20 0447 06/17/20 0700 06/17/20 0730 06/17/20 0811  BP:  (!) 138/58 (!) 145/59   Pulse:  84 83 (!) 102  Resp:  (!) 24 (!) 28 18  Temp: (S) 100.1 F (37.8 C)   98.1 F (36.7 C)  TempSrc: Oral     SpO2:  95% 95%   Weight:       Height:         Vitals:   06/17/20 0447 06/17/20 0700 06/17/20 0730 06/17/20 0811  BP:  (!) 138/58 (!) 145/59   Pulse:  84 83 (!) 102  Resp:  (!) 24 (!) 28 18  Temp: (S) 100.1 F (37.8 C)   98.1 F (36.7 C)  TempSrc: Oral     SpO2:  95% 95%   Weight:      Height:        Constitutional: NAD, alert and oriented x 3 Eyes: PERRL, lids and conjunctivae pallor ENMT: Mucous membranes are moist.  Neck: normal, supple, no masses, no thyromegaly Respiratory: clear to auscultation bilaterally, no wheezing, no crackles. Normal respiratory effort. No accessory muscle use.  Cardiovascular: Tachycardic, no murmurs / rubs / gallops. No extremity edema. 2+ pedal pulses. No carotid bruits.  Abdomen: no tenderness, no masses palpated. No hepatosplenomegaly. Bowel sounds positive.  Central adiposity Musculoskeletal: no clubbing / cyanosis. No joint deformity upper and lower extremities.  Skin: no rashes, lesions, ulcers.  Neurologic: No gross focal neurologic deficit.  Generalized weakness Psychiatric: Normal mood and affect.   Labs on Admission: I have personally reviewed following labs and imaging studies  CBC: Recent Labs  Lab 06/17/20 0357  WBC 20.4*  NEUTROABS 17.8*  HGB 11.8*  HCT 34.2*  MCV 82.4  PLT 308*   Basic Metabolic Panel: Recent Labs  Lab 06/17/20 0357  NA 133*  K 3.9  CL 102  CO2 18*  GLUCOSE 344*  BUN 38*  CREATININE 1.57*  CALCIUM 8.9   GFR: Estimated Creatinine Clearance: 31.1 mL/min (A) (by C-G formula based on SCr of 1.57 mg/dL (H)). Liver Function Tests: Recent Labs  Lab 06/17/20 0357  AST 27  ALT 21  ALKPHOS 83  BILITOT 1.0  PROT 6.2*  ALBUMIN 3.0*   No results for input(s): LIPASE, AMYLASE in the last 168 hours. No results for input(s): AMMONIA in the last 168 hours. Coagulation Profile: No results for input(s): INR, PROTIME in the last 168 hours. Cardiac Enzymes: No results for input(s): CKTOTAL, CKMB, CKMBINDEX, TROPONINI in the last  168 hours. BNP (last 3 results) No results for input(s): PROBNP in the last 8760 hours. HbA1C: No results for input(s): HGBA1C in the last 72 hours. CBG: No results for input(s): GLUCAP in the last 168 hours. Lipid Profile: No results for input(s): CHOL, HDL, LDLCALC, TRIG, CHOLHDL, LDLDIRECT in the last 72 hours. Thyroid Function Tests: No results for input(s): TSH, T4TOTAL, FREET4, T3FREE, THYROIDAB in the last 72 hours. Anemia Panel: No results for input(s): VITAMINB12, FOLATE, FERRITIN, TIBC, IRON, RETICCTPCT in the last 72 hours. Urine analysis:    Component Value Date/Time   COLORURINE YELLOW 01/24/2020 1527   APPEARANCEUR Cloudy (A) 05/13/2020 1110   LABSPEC 1.025 01/24/2020 1527   LABSPEC 1.013 01/04/2015 1239   PHURINE 5.0 01/24/2020 1527   GLUCOSEU Trace (A) 05/13/2020 1110   GLUCOSEU Negative 01/04/2015 1239   HGBUR LARGE (A) 01/24/2020 1527   BILIRUBINUR Negative 05/13/2020 1110   BILIRUBINUR Negative 01/04/2015 1239   KETONESUR NEGATIVE 01/24/2020 1527   PROTEINUR 3+ (A) 05/13/2020 1110   PROTEINUR >300 (A) 01/24/2020 1527   NITRITE Negative 05/13/2020 1110   NITRITE NEGATIVE 01/24/2020 1527   LEUKOCYTESUR 1+ (A) 05/13/2020 1110   LEUKOCYTESUR SMALL (A) 01/24/2020 1527   LEUKOCYTESUR 2+ 01/04/2015 1239    Radiological Exams on Admission: CT Angio Chest PE W and/or Wo Contrast  Result Date: 06/17/2020 CLINICAL DATA:  Positive D-dimer.  COVID. EXAM: CT ANGIOGRAPHY CHEST WITH CONTRAST TECHNIQUE: Multidetector CT imaging of the chest was performed using the standard protocol during bolus administration of intravenous contrast. Multiplanar CT image reconstructions and MIPs were obtained to evaluate the vascular anatomy. CONTRAST:  42mL OMNIPAQUE IOHEXOL 350 MG/ML SOLN COMPARISON:  07/05/2019 FINDINGS: Cardiovascular: No filling defects in the pulmonary arteries to suggest pulmonary emboli. Heart is normal size. Aortic atherosclerosis. No aneurysm. Coronary artery  calcifications. Mediastinum/Nodes: No mediastinal, hilar, or axillary adenopathy. Trachea and esophagus are unremarkable. Thyroid unremarkable. Lungs/Pleura: No confluent opacities or effusions. Upper Abdomen: Severe right hydronephrosis, slightly worsened from abdominal CT 07/05/2019. Musculoskeletal: Chest wall soft tissues are unremarkable. No acute bony abnormality. Review of the MIP images confirms the above findings. IMPRESSION: No acute bony abnormality. Postoperative changes in the thoracolumbar spine. No evidence of pulmonary embolus. Coronary artery disease.  Severe right hydronephrosis, worsening since October 2020. Electronically Signed   By: Rolm Baptise M.D.   On: 06/17/2020 07:38   DG Chest Portable 1 View  Result Date: 06/17/2020 CLINICAL DATA:  COVID with shortness of breath EXAM: PORTABLE CHEST 1 VIEW COMPARISON:  07/08/2019 FINDINGS: Normal heart size and mediastinal contours. No acute infiltrate or edema. No effusion or pneumothorax. No acute osseous findings. Artifact from EKG leads. Thoracolumbar posterior fusion hardware. IMPRESSION: Negative for pneumonia. Electronically Signed   By: Monte Fantasia M.D.   On: 06/17/2020 04:42    EKG: Independently reviewed.  Sinus tachycardia  Assessment/Plan Principal Problem:   Sepsis ([place]) Active Problems:   Benign essential hypertension   Chronic cystitis   Diabetes mellitus (H[facility])   Rheumatoid arthritis ([place])   COVID-19 virus infection   AKI (acute kidney injury) ([place])    Sepsis ([facility]) As evidenced by fever with a T-max of 103F, tachycardia, tachypnea, leukocytosis of 20,000 with a left shift and lactic acid of 3.1 Source of sepsis is unclear at this time but may be from a urinary source Patient does self-catheterization at home which increases her risk for UTIs and has a history of chronic cystitis with recurrent UTI Her last urine cultures yielded E. coli and Klebsiella pneumonia both sensitive to cephalosporins We will place  patient on Rocephin 1 g IV daily Repeat lactate levels Judicious IV fluid resuscitation    Acute kidney injury Etiology unclear but may be postobstructive Patient noted to have worsening of her right-sided hydronephrosis At baseline she has a serum creatinine of 0.8 and today on admission creatinine is 1.5 We will insert Foley catheter Judicious IV fluid hydration Consult urology    Diabetes mellitus with complications of hyperglycemia Patient has a history of diabetes mellitus and on admission noted to have hyperglycemia related to systemic steroid administration Patient was started on prednisone as an outpatient by her primary care provider for presumed COVID-19 bronchitis Chest x-ray is negative for pneumonia and patient is not hypoxic Will discontinue prednisone Optimize glycemic control with sliding scale coverage with insulin Renal function appears stable   Hypertension Continue amlodipine   COVID-19 viral infection Patient's COVID-19 PCR test was positive on 06/09/20 She presents to the ER for evaluation of high-grade fevers and chills as well as worsening shortness of breath and weakness We will treat patient with remdesivir per protocol   History of rheumatoid arthritis We will hold [place] for now    DVT prophylaxis: Lovenox Code Status: DNR Family Communication: Greater than 50% of time was spent discussing patient's condition and plan of care with her at the bedside.  She verbalizes understanding and agrees with the plan.  All questions and concerns have been addressed.  CODE STATUS was discussed and she is a DNR Disposition Plan: Back to previous home environment Consults called: Urology    Collier Bullock MD [facility]     06/17/2020, 9:21 AM

## 2020-06-17 NOTE — ED Notes (Signed)
Admit team at bedside; rhythm change, EKG

## 2020-06-17 NOTE — Progress Notes (Addendum)
Cross Cover Bedside noted patient + covid 9/8. Patient reports symptoms with fever, cough and weakness started Thursday 9/10. UA results noted, patient is on rocephin. Past UTI + kleb pneumoniae and e. Coli both susceptible to rocephin. Patient with new onset atrial fib RVR. One dose 1- mg cardizem given, second dose ordered, not given as reate improved 118-140, then coverted to sinus tach. dilt drip started to maintain.  Patient with increased work of breathing and sats91%, oxygen at 2 liters placed with improvement in sats 94% however resp rate remains high. Moist cough present. Esp wheeze noted. Concern for fluid overload, however repeat chem panel, creatinine remains elevated 1.37 , therefore IV fluids reduced to 50/hour. Good urine output noted in foley.  Lactic acid now noted improved at 2.0   With IV infiltrate and dilt off briefly, returned to afib RVR,  dilt bolus given and gtt resumed, rate improved BNP pending Cardiology consult Diona Fanti not started secondary to allergies Additionally, anticoagulation not started despite hgh [place] secondary to comorbiditeis and chronic steroid therapy causing bleeding risk

## 2020-06-17 NOTE — ED Provider Notes (Signed)
[facility] Emergency Department Provider Note  ____________________________________________  Time seen: Approximately 3:51 AM  I have reviewed the triage vital signs and the nursing notes.   HISTORY  Chief Complaint Shortness of Breath   HPI Stephanie [name] is a 79 y.o. 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  Patient tested positive for Covid 5 days ago.  She is fully vaccinated.  Has been having progressively worsening shortness of breath and weakness.  Unable to get up this morning.  Lives at home with her husband.  No vomiting or diarrhea.  Fever of 104 per EMS.  No hypoxia per EMS.  She denies chest pain or abdominal pain.   Past Medical History:  Diagnosis Date  . Arthritis   . Asthma   . Cancer ([place]) 1996   colon  . Diabetes mellitus without complication ([place])   . GERD (gastroesophageal reflux disease)   . Glaucoma   . Hyperlipidemia   . Hypertension   . Lumbar disc disease   . Small bowel obstruction ([place])   . Vitamin B 12 deficiency     Patient Active Problem List   Diagnosis Date Noted  . Chronic left SI joint pain 04/19/2020  . 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[place]) 04/19/2020  . Chronic radicular lumbar pain 04/19/2020  . Lumbar spondylosis 04/19/2020  . Chronic pain syndrome 04/19/2020  . Sepsis ([place]) 07/05/2019  . CAP (community acquired pneumonia) 07/05/2019  . Gastroenteritis 07/05/2019  . DM type 2 with diabetic mixed hyperlipidemia ([place]) 04/30/2018  . Elevated serum creatinine 10/16/2017  . Rheumatoid arthritis involving multiple sites with positive rheumatoid factor ([place]) 08/13/2016  . Hyperlipidemia, mixed 04/18/2016  . Gross hematuria 11/01/2015  . History of colon cancer 10/18/2014  . Squamous cell metaplasia of urinary bladder 04/29/2014    . Osteoarthritis of knee 04/01/2014  . B-complex deficiency 03/03/2014  . Benign essential hypertension 01/16/2014  . Unspecified thoracic, thoracolumbar and lumbosacral intervertebral disc disorder 01/16/2014  . Malignant neoplasm of colon ([place]) 01/16/2014  . Bladder neoplasm of uncertain malignant potential 09/14/2013  . Nocturia 08/05/2013  . Scoliosis (and kyphoscoliosis), idiopathic 06/24/2013  . Chronic cystitis 05/04/2013  . Increased frequency of urination 05/04/2013  . Incomplete emptying of bladder 05/04/2013    Past Surgical History:  Procedure Laterality Date  . ABDOMINAL HYSTERECTOMY    . BACK SURGERY    . COLON SURGERY    . COLONOSCOPY WITH PROPOFOL N/A 05/22/2017   Procedure: COLONOSCOPY WITH PROPOFOL;  Surgeon: Manya Silvas, MD;  Location: [facility] ENDOSCOPY;  Service: Endoscopy;  Laterality: N/A;  . FRACTURE SURGERY    . KNEE ARTHROSCOPY    . LUMBAR LAMINECTOMY      Prior to Admission medications   Medication Sig Start Date End Date Taking? Authorizing Provider  amLODipine (NORVASC) 10 MG tablet Take 5 mg by mouth 2 (two) times daily.    Yes [provider]  brimonidine (ALPHAGAN) 0.2 % ophthalmic solution Place 1 drop into both eyes 2 (two) times daily. 05/31/20  Yes [provider]  Cranberry 400 MG CAPS Take by mouth. 03/24/20  Yes [provider]  estradiol (ESTRACE) 0.1 MG/GM vaginal cream Place 1 Applicatorful vaginally at bedtime.   Yes [provider]  glipiZIDE (GLUCOTROL) 10 MG tablet Take 10 mg by mouth daily before breakfast.   Yes [provider]  hydrochlorothiazide (HYDRODIURIL) 25 MG tablet Take 25 mg by mouth daily.   Yes [provider]  leflunomide (ARAVA) 10 MG tablet Take 10 mg by mouth daily.   Yes [provider]  metFORMIN (GLUCOPHAGE-XR) 500 MG 24 hr tablet Take 500 mg by mouth 2 (two) times daily.   Yes [provider]  metoprolol tartrate (LOPRESSOR) 50 MG tablet Take  50 mg by mouth 2 (two) times daily.   Yes [provider]  omeprazole (PRILOSEC) 40 MG capsule Take 40 mg by mouth daily.   Yes [provider]  potassium chloride SA (K-DUR,KLOR-CON) 20 MEQ tablet Take 20 mEq by mouth daily.   Yes [provider]  predniSONE (DELTASONE) 20 MG tablet Take 40 mg by mouth daily with breakfast. 06/09/20 06/19/20 Yes [provider]  Probiotic Product (PROBIOTIC-10 PO) Take 1 Dose by mouth daily.    Yes [provider]  rosuvastatin (CRESTOR) 10 MG tablet Take by mouth. 03/24/20  Yes [provider]  tacrolimus (PROTOPIC) 0.1 % ointment Apply 1 application topically 2 (two) times daily as needed (rash).    Yes [provider]  tamsulosin (FLOMAX) 0.4 MG CAPS capsule TAKE 1 CAPSULE(0.4 MG) BY MOUTH DAILY Patient taking differently: Take 0.4 mg by mouth daily.  05/27/20  Yes McGowan, Larene [place] A, PA-C  vitamin B-12 (CYANOCOBALAMIN) 1000 MCG tablet Take 1,000 mcg by mouth daily.   Yes [provider]  CONTOUR TEST test strip 1 each by Other route daily.  03/09/18   [provider]    Allergies Ibuprofen, Indomethacin, Infliximab, Methotrexate derivatives, Moexipril, Percocet [oxycodone-acetaminophen], and Naprosyn [naproxen]  Family History  Problem Relation Age of Onset  . Breast cancer Neg Hx     Social History Social History   Tobacco Use  . Smoking status: Never Smoker  . Smokeless tobacco: Never Used  Vaping Use  . Vaping Use: Never used  Substance Use Topics  . Alcohol use: No  . Drug use: No    Review of Systems  Constitutional: + fever and generalized weakness Eyes: Negative for visual changes. ENT: Negative for sore throat. Neck: No neck pain  Cardiovascular: Negative for chest pain. Respiratory: + shortness of breath. Gastrointestinal: Negative for abdominal pain, vomiting or diarrhea. Genitourinary: Negative for dysuria. Musculoskeletal: Negative for back  pain. Skin: Negative for rash. Neurological: Negative for headaches, weakness or numbness. Psych: No SI or HI  ____________________________________________   PHYSICAL EXAM:  VITAL SIGNS: Vitals:   06/17/20 0352 06/17/20 0447  BP: 137/63   Pulse: (!) 106   Resp: (!) 22   Temp: (!) 103.2 F (39.6 C) (S) 100.1 F (37.8 C)  SpO2: 95%     Constitutional: Alert and oriented,  in no apparent distress. HEENT:      Head: Normocephalic and atraumatic.         Eyes: Conjunctivae are normal. Sclera is non-icteric.       Mouth/Throat: Mucous membranes are moist.       Neck: Supple with no signs of meningismus. Cardiovascular: Tachycardic with regular rhythm, no murmurs Respiratory: Normal respiratory effort. Lungs are clear to auscultation bilaterally. No wheezes, crackles, or rhonchi.  Gastrointestinal: Soft, non tender, and non distended. Musculoskeletal:  No edema, cyanosis, or erythema of extremities. Neurologic: Normal speech and language. Face is symmetric. Moving all extremities. No gross focal neurologic deficits are appreciated. Skin: Skin is warm, dry and intact. No rash noted. Psychiatric: Mood and affect are normal. Speech and behavior are normal.  ____________________________________________  LABS (all labs ordered are listed, but only abnormal results are displayed)  Labs Reviewed  CBC WITH DIFFERENTIAL/PLATELET - Abnormal; Notable for the following components:      Result Value   WBC 20.4 (*)    Hemoglobin 11.8 (*)    HCT 34.2 (*)    Platelets 144 (*)    Neutro Abs 17.8 (*)    Lymphs Abs 0.3 (*)    Abs Immature Granulocytes 1.46 (*)    All other components within normal limits  COMPREHENSIVE METABOLIC PANEL - Abnormal; Notable for the following components:   Sodium 133 (*)    CO2 18 (*)    Glucose, Bld 344 (*)    BUN 38 (*)    Creatinine, Ser 1.57 (*)    Total Protein 6.2 (*)    Albumin 3.0 (*)    GFR calc non Af Amer 31 (*)    GFR calc Af Amer 36 (*)     All other components within normal limits  LACTIC ACID, PLASMA - Abnormal; Notable for the following components:   Lactic Acid, Venous 3.1 (*)    All other components within normal limits  TROPONIN I (HIGH SENSITIVITY) - Abnormal; Notable for the following components:   Troponin I (High Sensitivity) 61 (*)    All other components within normal limits  CULTURE, BLOOD (ROUTINE X 2)  CULTURE, BLOOD (ROUTINE X 2)  PROCALCITONIN  FIBRIN DERIVATIVES D-DIMER ([facility] ONLY)  LACTIC ACID, PLASMA   ____________________________________________  EKG  ED ECG REPORT I, Rudene Re, the attending physician, personally viewed and interpreted this ECG.  Sinus tachycardia, rate of 106, incomplete right bundle branch block, normal QTC, left axis deviation, no ST elevations or depressions.  Unchanged from prior. ____________________________________________  RADIOLOGY  I have personally reviewed the images performed during this visit and I agree with the Radiologist's read.   Interpretation by Radiologist:  DG Chest Portable 1 View  Result Date: 06/17/2020 CLINICAL DATA:  COVID with shortness of breath EXAM: PORTABLE CHEST 1 VIEW COMPARISON:  07/08/2019 FINDINGS: Normal heart size and mediastinal contours. No acute infiltrate or edema. No effusion or pneumothorax. No acute osseous findings. Artifact from EKG leads. Thoracolumbar posterior fusion hardware. IMPRESSION: Negative for pneumonia. Electronically Signed   By: Monte Fantasia M.D.   On: 06/17/2020 04:42     ____________________________________________   PROCEDURES  Procedure(s) performed:yes .1-3 Lead EKG Interpretation Performed by: Rudene Re, MD Authorized by: Rudene Re, MD     Interpretation: non-specific     ECG rate assessment: tachycardic     Rhythm: sinus tachycardia     Ectopy: none     Critical Care performed: yes  CRITICAL CARE Performed by: Rudene Re  ?  Total critical care time:  45 min  Critical care time was exclusive of separately billable procedures and treating other patients.  Critical care was necessary to treat or prevent imminent or life-threatening deterioration.  Critical care was time spent personally by me on the following activities: development of treatment plan with patient and/or surrogate as well as nursing, discussions with consultants, evaluation of patient's response to treatment, examination of patient, obtaining history from patient or surrogate, ordering and performing treatments and interventions, ordering and review of laboratory studies, ordering and review of radiographic studies, pulse oximetry and re-evaluation of patient's condition.  ____________________________________________   INITIAL IMPRESSION / ASSESSMENT AND PLAN / ED COURSE   79 y.o. 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  Unfortunately patient is fully vaccinated. She has had a few runs of SVT which resolved with no intervention. IV mag given for possible electrolyte abnormalities. Chemistry panel showing AKI, hyperglycemia with no evidence of DKA.  Normal potassium.  Elevated WBC and lactic, AKI. IVF given.  Procalcitonin elevated will cover with broad-spectrum antibiotics for sepsis including cefepime, Vanco, and is it through for atypical overlying bacterial infection.  Troponin mildly elevated possible Covid myocarditis vs demand ischemia.. D-dimer pending, PE also a possibility.  Old medical records reviewed.  Patient placed on telemetry.  Patient be admitted to the hospitalist service.      _____________________________________________ Please note:  Patient was evaluated in Emergency Department today for the symptoms described in the history of present  illness. Patient was evaluated in the context of the global COVID-19 pandemic, which necessitated consideration that the patient might be at risk for infection with the SARS-CoV-2 virus that causes COVID-19. Institutional protocols and algorithms that pertain to the evaluation of patients at risk for COVID-19 are in a state of rapid change based on information released by regulatory bodies including the CDC and federal and state organizations. These policies and algorithms were followed during the patient's care in the ED.  Some ED evaluations and interventions may be delayed as a result of limited staffing during the pandemic.   [place] Controlled Substance Database was reviewed by me. ____________________________________________   FINAL CLINICAL IMPRESSION(S) / ED DIAGNOSES   Final diagnoses:  COVID-19  Sepsis with acute renal failure without septic shock, due to unspecified organism, unspecified acute renal failure type ([place])  AKI (acute kidney injury) ([place])  Paroxysmal SVT (supraventricular tachycardia) ([place])      NEW MEDICATIONS STARTED DURING THIS VISIT:  ED Discharge Orders    None       Note:  This document was prepared using Dragon voice recognition software and may include unintentional dictation errors.    Alfred Levins, [place], MD 06/17/20 [phone]

## 2020-06-17 NOTE — ED Notes (Signed)
Add on called to lab

## 2020-06-17 NOTE — ED Notes (Addendum)
Pt has had a 4th run of SVT at this time HR 142-158 and converted back to NSR 93bpm  .

## 2020-06-17 NOTE — ED Notes (Signed)
SVT run in 160s, last less than 20 seconds.  Dr Francine Graven notified

## 2020-06-18 ENCOUNTER — Inpatient Hospital Stay: Payer: Medicare Other

## 2020-06-18 ENCOUNTER — Encounter
Admission: EM | Disposition: A | Discharge status: Skilled Nursing Facility | Payer: Self-pay | Source: Home / Self Care | Attending: Internal Medicine

## 2020-06-18 ENCOUNTER — Inpatient Hospital Stay: Payer: Medicare Other | Admitting: Anesthesiology

## 2020-06-18 DIAGNOSIS — Z8679 Personal history of other diseases of the circulatory system: Secondary | ICD-10-CM

## 2020-06-18 DIAGNOSIS — I10 Essential (primary) hypertension: Secondary | ICD-10-CM | POA: Diagnosis not present

## 2020-06-18 DIAGNOSIS — A419 Sepsis, unspecified organism: Secondary | ICD-10-CM

## 2020-06-18 DIAGNOSIS — N1330 Unspecified hydronephrosis: Secondary | ICD-10-CM

## 2020-06-18 DIAGNOSIS — Z79899 Other long term (current) drug therapy: Secondary | ICD-10-CM

## 2020-06-18 DIAGNOSIS — I4891 Unspecified atrial fibrillation: Secondary | ICD-10-CM | POA: Diagnosis not present

## 2020-06-18 DIAGNOSIS — N10 Acute tubulo-interstitial nephritis: Secondary | ICD-10-CM | POA: Diagnosis present

## 2020-06-18 DIAGNOSIS — D72829 Elevated white blood cell count, unspecified: Secondary | ICD-10-CM

## 2020-06-18 DIAGNOSIS — R739 Hyperglycemia, unspecified: Secondary | ICD-10-CM | POA: Diagnosis present

## 2020-06-18 DIAGNOSIS — E872 Acidosis, unspecified: Secondary | ICD-10-CM | POA: Diagnosis present

## 2020-06-18 DIAGNOSIS — E7800 Pure hypercholesterolemia, unspecified: Secondary | ICD-10-CM | POA: Diagnosis present

## 2020-06-18 HISTORY — PX: CYSTOSCOPY WITH STENT PLACEMENT: SHX5790

## 2020-06-18 LAB — BRAIN NATRIURETIC PEPTIDE: B Natriuretic Peptide: 410.1 pg/mL — ABNORMAL HIGH (ref 0.0–100.0)

## 2020-06-18 LAB — FERRITIN: Ferritin: 345 ng/mL — ABNORMAL HIGH (ref 11–307)

## 2020-06-18 LAB — CBC WITH DIFFERENTIAL/PLATELET
Abs Immature Granulocytes: 0.67 10*3/uL — ABNORMAL HIGH (ref 0.00–0.07)
Basophils Absolute: 0.1 10*3/uL (ref 0.0–0.1)
Basophils Relative: 1 %
Eosinophils Absolute: 1.2 10*3/uL — ABNORMAL HIGH (ref 0.0–0.5)
Eosinophils Relative: 9 %
HCT: 31.5 % — ABNORMAL LOW (ref 36.0–46.0)
Hemoglobin: 11.1 g/dL — ABNORMAL LOW (ref 12.0–15.0)
Immature Granulocytes: 5 %
Lymphocytes Relative: 3 %
Lymphs Abs: 0.4 10*3/uL — ABNORMAL LOW (ref 0.7–4.0)
MCH: 28.4 pg (ref 26.0–34.0)
MCHC: 35.2 g/dL (ref 30.0–36.0)
MCV: 80.6 fL (ref 80.0–100.0)
Monocytes Absolute: 0.7 10*3/uL (ref 0.1–1.0)
Monocytes Relative: 5 %
Neutro Abs: 10.7 10*3/uL — ABNORMAL HIGH (ref 1.7–7.7)
Neutrophils Relative %: 77 %
Platelets: 97 10*3/uL — ABNORMAL LOW (ref 150–400)
RBC: 3.91 MIL/uL (ref 3.87–5.11)
RDW: 14.5 % (ref 11.5–15.5)
Smear Review: DECREASED
WBC: 13.7 10*3/uL — ABNORMAL HIGH (ref 4.0–10.5)
nRBC: 0 % (ref 0.0–0.2)

## 2020-06-18 LAB — COMPREHENSIVE METABOLIC PANEL
ALT: 15 U/L (ref 0–44)
AST: 26 U/L (ref 15–41)
Albumin: 2.2 g/dL — ABNORMAL LOW (ref 3.5–5.0)
Alkaline Phosphatase: 84 U/L (ref 38–126)
Anion gap: 9 (ref 5–15)
BUN: 28 mg/dL — ABNORMAL HIGH (ref 8–23)
CO2: 20 mmol/L — ABNORMAL LOW (ref 22–32)
Calcium: 8.1 mg/dL — ABNORMAL LOW (ref 8.9–10.3)
Chloride: 106 mmol/L (ref 98–111)
Creatinine, Ser: 1.75 mg/dL — ABNORMAL HIGH (ref 0.44–1.00)
GFR calc Af Amer: 32 mL/min — ABNORMAL LOW (ref >60)
GFR calc non Af Amer: 28 mL/min — ABNORMAL LOW (ref >60)
Glucose, Bld: 97 mg/dL (ref 70–99)
Potassium: 3.3 mmol/L — ABNORMAL LOW (ref 3.5–5.1)
Sodium: 135 mmol/L (ref 135–145)
Total Bilirubin: 0.6 mg/dL (ref 0.3–1.2)
Total Protein: 5.3 g/dL — ABNORMAL LOW (ref 6.5–8.1)

## 2020-06-18 LAB — GLUCOSE, CAPILLARY
Glucose-Capillary: 96 mg/dL (ref 70–99)
Glucose-Capillary: 110 mg/dL — ABNORMAL HIGH (ref 70–99)
Glucose-Capillary: 123 mg/dL — ABNORMAL HIGH (ref 70–99)
Glucose-Capillary: 117 mg/dL — ABNORMAL HIGH (ref 70–99)

## 2020-06-18 LAB — PROTIME-INR
INR: 1.2 (ref 0.8–1.2)
Prothrombin Time: 14.4 seconds (ref 11.4–15.2)

## 2020-06-18 LAB — FIBRIN DERIVATIVES D-DIMER (ARMC ONLY): Fibrin derivatives D-dimer (ARMC): >7500 ng/mL (FEU) — ABNORMAL HIGH (ref 0.00–499.00)

## 2020-06-18 LAB — C-REACTIVE PROTEIN: CRP: 34.7 mg/dL — ABNORMAL HIGH (ref <1.0)

## 2020-06-18 LAB — PHOSPHORUS: Phosphorus: 2.9 mg/dL (ref 2.5–4.6)

## 2020-06-18 LAB — MAGNESIUM: Magnesium: 1.8 mg/dL (ref 1.7–2.4)

## 2020-06-18 LAB — APTT: aPTT: 50 seconds — ABNORMAL HIGH (ref 24–36)

## 2020-06-18 LAB — TSH: TSH: 0.958 u[IU]/mL (ref 0.350–4.500)

## 2020-06-18 SURGERY — CYSTOSCOPY, WITH STENT INSERTION
Anesthesia: General | Laterality: Right

## 2020-06-18 MED ORDER — PROPOFOL 500 MG/50ML IV EMUL
INTRAVENOUS | Status: DC | PRN
  Administered 2020-06-18: 40 mg via INTRAVENOUS

## 2020-06-18 MED ORDER — POTASSIUM CHLORIDE CRYS ER 20 MEQ PO TBCR
40.0000 meq | EXTENDED_RELEASE_TABLET | Freq: Once | ORAL | Status: AC
  Administered 2020-06-18: 40 meq via ORAL
  Filled 2020-06-18: qty 2

## 2020-06-18 MED ORDER — HEPARIN (PORCINE) 25000 UT/250ML-% IV SOLN
1050.0000 [IU]/h | INTRAVENOUS | Status: DC
  Administered 2020-06-18: 1050 [IU]/h via INTRAVENOUS
  Filled 2020-06-18: qty 250

## 2020-06-18 MED ORDER — KETAMINE HCL 50 MG/ML IJ SOLN: INTRAMUSCULAR | Status: DC | PRN

## 2020-06-18 MED ORDER — METOPROLOL TARTRATE 5 MG/5ML IV SOLN: 5.0000 mg | Freq: Once | INTRAVENOUS | Status: AC

## 2020-06-18 MED ORDER — AMIODARONE HCL IN DEXTROSE 360-4.14 MG/200ML-% IV SOLN
60.0000 mg/h | INTRAVENOUS | Status: DC
  Administered 2020-06-18: 60 mg/h via INTRAVENOUS
  Filled 2020-06-18 (×2): qty 200

## 2020-06-18 MED ORDER — KETAMINE HCL 50 MG/ML IJ SOLN
INTRAMUSCULAR | Status: AC
  Filled 2020-06-18: qty 10

## 2020-06-18 MED ORDER — METOPROLOL TARTRATE 5 MG/5ML IV SOLN
INTRAVENOUS | Status: AC
  Administered 2020-06-18: 5 mg
  Filled 2020-06-18: qty 5

## 2020-06-18 MED ORDER — IOHEXOL 180 MG/ML  SOLN
INTRAMUSCULAR | Status: DC | PRN
  Administered 2020-06-18: 10 mL

## 2020-06-18 MED ORDER — EPHEDRINE SULFATE 50 MG/ML IJ SOLN
INTRAMUSCULAR | Status: DC | PRN
  Administered 2020-06-18: 15 mg via INTRAVENOUS

## 2020-06-18 MED ORDER — METOPROLOL TARTRATE 5 MG/5ML IV SOLN
INTRAVENOUS | Status: AC
  Filled 2020-06-18: qty 5

## 2020-06-18 MED ORDER — NOREPINEPHRINE 4 MG/250ML-% IV SOLN
2.0000 ug/min | INTRAVENOUS | Status: DC
  Filled 2020-06-18: qty 250

## 2020-06-18 MED ORDER — PROPOFOL 500 MG/50ML IV EMUL
INTRAVENOUS | Status: AC
  Filled 2020-06-18: qty 50

## 2020-06-18 MED ORDER — DILTIAZEM HCL 25 MG/5ML IV SOLN
10.0000 mg | Freq: Once | INTRAVENOUS | Status: AC
  Administered 2020-06-18: 10 mg via INTRAVENOUS

## 2020-06-18 MED ORDER — AMIODARONE HCL IN DEXTROSE 360-4.14 MG/200ML-% IV SOLN
30.0000 mg/h | INTRAVENOUS | Status: DC
  Administered 2020-06-18 – 2020-06-20 (×4): 30 mg/h via INTRAVENOUS
  Filled 2020-06-18 (×4): qty 200

## 2020-06-18 MED ORDER — NOREPINEPHRINE 4 MG/250ML-% IV SOLN: 0.0000 ug/min | INTRAVENOUS | Status: DC

## 2020-06-18 MED ORDER — DILTIAZEM HCL 25 MG/5ML IV SOLN
10.0000 mg | Freq: Once | INTRAVENOUS | Status: DC
  Filled 2020-06-18: qty 5

## 2020-06-18 MED ORDER — CHLORHEXIDINE GLUCONATE CLOTH 2 % EX PADS
6.0000 | MEDICATED_PAD | Freq: Every day | CUTANEOUS | Status: DC
  Administered 2020-06-20 – 2020-07-09 (×17): 6 via TOPICAL

## 2020-06-18 MED ORDER — METOPROLOL TARTRATE 5 MG/5ML IV SOLN
5.0000 mg | Freq: Once | INTRAVENOUS | Status: AC
  Administered 2020-06-18: 5 mg via INTRAVENOUS

## 2020-06-18 MED ORDER — ORAL CARE MOUTH RINSE
15.0000 mL | Freq: Two times a day (BID) | OROMUCOSAL | Status: DC
  Administered 2020-06-18 – 2020-07-10 (×36): 15 mL via OROMUCOSAL

## 2020-06-18 MED ORDER — SODIUM CHLORIDE 0.9% FLUSH: 10.0000 mL | INTRAVENOUS | Status: DC | PRN

## 2020-06-18 MED ORDER — ENOXAPARIN SODIUM 30 MG/0.3ML ~~LOC~~ SOLN: 30.0000 mg | SUBCUTANEOUS | Status: DC

## 2020-06-18 MED ORDER — KETAMINE HCL 10 MG/ML IJ SOLN
INTRAMUSCULAR | Status: DC | PRN
  Administered 2020-06-18: 50 mg via INTRAVENOUS

## 2020-06-18 MED ORDER — FUROSEMIDE 10 MG/ML IJ SOLN
40.0000 mg | Freq: Once | INTRAMUSCULAR | Status: AC
  Administered 2020-06-18: 40 mg via INTRAVENOUS
  Filled 2020-06-18: qty 4

## 2020-06-18 MED ORDER — SODIUM CHLORIDE 0.9% FLUSH
10.0000 mL | Freq: Two times a day (BID) | INTRAVENOUS | Status: DC
  Administered 2020-06-18 – 2020-06-24 (×11): 10 mL
  Administered 2020-06-24: 23:00:00 20 mL
  Administered 2020-06-25 – 2020-06-27 (×4): 10 mL
  Administered 2020-06-28: 20 mL
  Administered 2020-06-28 – 2020-06-29 (×2): 10 mL
  Administered 2020-06-29: 21:00:00 20 mL
  Administered 2020-06-30 – 2020-07-06 (×13): 10 mL
  Administered 2020-07-07: 30 mL
  Administered 2020-07-07: 10 mL

## 2020-06-18 MED ORDER — AMIODARONE LOAD VIA INFUSION
150.0000 mg | Freq: Once | INTRAVENOUS | Status: AC
  Administered 2020-06-18: 150 mg via INTRAVENOUS
  Filled 2020-06-18: qty 83.34

## 2020-06-18 SURGICAL SUPPLY — 16 items
BAG DRAIN CYSTO-URO LG1000N (MISCELLANEOUS) ×2 IMPLANT
CATH URETL 5X70 OPEN END (CATHETERS) ×2 IMPLANT
GLOVE BIO SURGEON STRL SZ8 (GLOVE) ×2 IMPLANT
GOWN STRL REUS W/ TWL LRG LVL4 (GOWN DISPOSABLE) ×1 IMPLANT
GOWN STRL REUS W/ TWL XL LVL3 (GOWN DISPOSABLE) ×1 IMPLANT
GOWN STRL REUS W/TWL LRG LVL4 (GOWN DISPOSABLE) ×1
GOWN STRL REUS W/TWL XL LVL3 (GOWN DISPOSABLE) ×1
GUIDEWIRE STR DUAL SENSOR (WIRE) ×4 IMPLANT
KIT TURNOVER CYSTO (KITS) ×2 IMPLANT
PACK CYSTO AR (MISCELLANEOUS) ×2 IMPLANT
SET CYSTO W/LG BORE CLAMP LF (SET/KITS/TRAYS/PACK) ×2 IMPLANT
SOL .9 NS 3000ML IRR  AL (IV SOLUTION) ×1
SOL .9 NS 3000ML IRR UROMATIC (IV SOLUTION) ×1 IMPLANT
STENT URET 6FRX24 CONTOUR (STENTS) ×2 IMPLANT
STENT URET 6FRX26 CONTOUR (STENTS) IMPLANT
WATER STERILE IRR 1000ML POUR (IV SOLUTION) ×2 IMPLANT

## 2020-06-18 NOTE — Transfer of Care (Signed)
Immediate Anesthesia Transfer of Care Note  Patient: Stephanie [name]  Procedure(s) Performed: CYSTOSCOPY WITH RETROGRADE PYELOGRAM, URETEROSCOPY AND STENT PLACEMENT (Right )  Patient Location: 240A     Anesthesia Type:General   Level of Consciousness: drowsy but awakens when I call her name.  Airway & Oxygen Therapy: Patient Spontanous Breathing and Patient connected to nasal cannula oxygen  Post-op Assessment: Report given to RN and Post -op Vital signs reviewed and stable  Post vital signs: Reviewed and stable  Last Vitals:  Vitals Value Taken Time  BP 131/80 06/18/20 1746  Temp 37.9 C 06/18/20 1746  Pulse    Resp 24 06/18/20 1754  SpO2    Vitals shown include unvalidated device data.  Last Pain:  Vitals:   06/18/20 0800  TempSrc:   PainSc: 0-No pain      Patients Stated Pain Goal: 0 (49/20/10 0712)  Complications: No complications documented.

## 2020-06-18 NOTE — Plan of Care (Signed)
Discussed with patient plan of care, pain management and reason for [place] performing peri care / foley care every shift with some teach back displayed. Will update husband this morning with admission

## 2020-06-18 NOTE — Progress Notes (Signed)
Informed lab of patient's arrival to floor from ER department and 0500 am labs not being drawn yet.

## 2020-06-18 NOTE — Progress Notes (Signed)
Contacted husband and let him know of wife's admission to the floor.  [phone] for room and [phone] for floor

## 2020-06-18 NOTE — ED Notes (Signed)
Call for report 

## 2020-06-18 NOTE — Op Note (Signed)
Procedure: 1.  Cystoscopy with right retrograde pyelogram and interpretation. 2.  Cystoscopy and insertion of right double-J stent. 3.  Application of fluoroscopy.  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  Surgeon: Dr. Irine Seal.  Anesthesia: MAC.  Drain: 6 [place] by 24 cm right contour double-J stent and Foley catheter.  Specimen: None.  EBL: None.  Complications: None.  Indications: The patient is a 79 year old female with a long history of chronic urinary tract infections, right UPJ obstruction and urinary retention on self-catheterization.  She was admitted to the hospital with sepsis and was found to have urinary tract infection but she had also tested positive for Covid on 06/09/2020 and it was not clear whether her fever was related to the urinary system or the viral illness.  But chest CT did not demonstrate significant pulmonary issues and her creatinine which initially improved continue to decline and it was felt that stenting was indicated.  Procedure: She she had been on ceftriaxone.  She was taken operating room where she was placed on the table in the lithotomy position.  Sedation was given as needed.  Her genitalia was prepped with Betadine solution and she was draped in usual sterile fashion.  Cystoscopy was performed and a 68 [place] scope with 30 and 70 degree lenses.  Examination revealed a normal urethra.  The bladder wall had mild trabeculation with diffuse changes consistent with follicular cystitis there were particularly prominent at the left trigone.  This area was actually seen on CT.  The right ureteral orifice was unremarkable as was the left ureteral orifice.  The right ureteral orifice was cannulated with a 5 [place] open-ended catheter and contrast was instilled.  The right retrograde pyelogram  demonstrated a normal caliber ureter with some mild tortuosity in the proximal portion and a tight UPJ with hydronephrosis of the renal collecting system and some filling defect is seen on CT.  A sensor wire was then advanced to the kidney without difficulty and the open-ended catheter was removed.  A 6 [place] by 24 cm contour double-J stent without tether was passed to the kidney under fluoroscopic guidance.  The wire was removed, leaving a good coil in the kidney and a good coil in the bladder.  Purulent urine was noted efflux from the stent ports.  The cystoscope was removed and a Foley catheter was placed.  She was taken down from lithotomy position and will be moved to the ICU from the operating room.

## 2020-06-18 NOTE — Progress Notes (Signed)
   06/18/20 2000  Assess: MEWS Score  Temp (!) 100.7 F (38.2 C)  BP 137/80  Pulse Rate (!) 152  ECG Heart Rate (!) 147  Resp (!) 30  SpO2 96 %  Assess: MEWS Score  MEWS Temp 1  MEWS Systolic 0  MEWS Pulse 3  MEWS RR 2  MEWS LOC 0  MEWS Score 6  MEWS Score Color Red  Assess: if the MEWS score is Yellow or Red  Were vital signs taken at a resting state? Yes  Focused Assessment No change from prior assessment  Early Detection of Sepsis Score *See Row Information* High  MEWS guidelines implemented *See Row Information* No, previously yellow, continue vital signs every 4 hours  Treat  MEWS Interventions Escalated (See documentation below)  Pain Scale 0-10  Pain Score 0  Take Vital Signs  Increase Vital Sign Frequency  Red: Q 1hr X 4 then Q 4hr X 4, if remains red, continue Q 4hrs  Escalate  MEWS: Escalate Red: discuss with charge nurse/RN and provider, consider discussing with RRT  Notify: Charge Nurse/RN  Name of Charge Nurse/RN Notified Angela, RN  Date Charge Nurse/RN Notified 06/18/20  Time Charge Nurse/RN Notified 2000  Notify: Provider  Provider Name/Title Dr. Dwyane Dee, NP  Date Provider Notified 06/18/20  Time Provider Notified 2000  Notification Type  (secure chat)  Notification Reason  (Afib rvr 130's-150, need to stop IVF's, temp)  Response See new orders  Date of Provider Response 06/18/20  Time of Provider Response 2001  Document  Patient Outcome Not stable and remains on department

## 2020-06-18 NOTE — Progress Notes (Signed)
Patient changed from cardizem gtt to amio gtt.

## 2020-06-18 NOTE — ED Notes (Signed)
Scott, pharm, advised cool compress for the diltizam infiltration, ice pack applied and IV removed

## 2020-06-18 NOTE — Progress Notes (Signed)
   06/18/20 1746  Assess: MEWS Score  Temp 100.2 F (37.9 C)  BP 131/80  ECG Heart Rate (!) 148  Resp (!) 23  Assess: MEWS Score  MEWS Temp 0  MEWS Systolic 0  MEWS Pulse 3  MEWS RR 1  MEWS LOC 0  MEWS Score 4  MEWS Score Color Red  Assess: if the MEWS score is Yellow or Red  Were vital signs taken at a resting state? Yes  Focused Assessment Change from prior assessment (see assessment flowsheet)  Early Detection of Sepsis Score *See Row Information* High  MEWS guidelines implemented *See Row Information* Yes  Treat  MEWS Interventions Administered prn meds/treatments;Escalated (See documentation below);Other (Comment) (MD at bedside, new order recieved)  Pain Scale 0-10  Pain Score 0  Take Vital Signs  Increase Vital Sign Frequency  Red: Q 1hr X 4 then Q 4hr X 4, if remains red, continue Q 4hrs  Escalate  MEWS: Escalate Red: discuss with charge nurse/RN and provider, consider discussing with RRT  Notify: Charge Nurse/RN  Name of Charge Nurse/RN Notified [place], [place]  Date Charge Nurse/RN Notified 06/18/20  Time Charge Nurse/RN Notified 1750  Notify: Provider  Provider Name/Title Dr. Billie Ruddy  Date Provider Notified 06/18/20  Time Provider Notified 1747  Notification Type Face-to-face  Notification Reason Change in status  Response See new orders  Date of Provider Response 06/18/20  Time of Provider Response 1747

## 2020-06-18 NOTE — Consult Note (Signed)
ANTICOAGULATION CONSULT NOTE - Initial Consult  Pharmacy Consult for Heparin  Indication: atrial fibrillation  Allergies  Allergen Reactions  . Ibuprofen Itching  . Indomethacin   . Infliximab   . Methotrexate Derivatives   . Moexipril   . Percocet [Oxycodone-Acetaminophen] Itching  . Naprosyn [Naproxen] Rash    Patient Measurements: Height: 5\' 6"  (167.6 cm) Weight: 80.2 kg (176 lb 12.9 oz) IBW/kg (Calculated) : 59.3 Heparin Dosing Weight: 75.9 kg  Vital Signs: Temp: 100.2 F (37.9 C) (09/18 1746) BP: 137/80 (09/18 2000) Pulse Rate: 152 (09/18 2000)  Labs: Recent Labs    06/17/20 0357 06/17/20 1101 06/17/20 1938 06/18/20 0809  HGB 11.8*  --   --  11.1*  HCT 34.2*  --   --  31.5*  PLT 144*  --   --  97*  CREATININE 1.57*  --  1.37* 1.75*  TROPONINIHS 61* 71*  --   --     Estimated Creatinine Clearance: 28.8 mL/min (A) (by C-G formula based on SCr of 1.75 mg/dL (H)).   Medical History: Past Medical History:  Diagnosis Date  . Arthritis   . Asthma   . Cancer ([place]) 1996   colon  . Diabetes mellitus without complication ([place])   . GERD (gastroesophageal reflux disease)   . Glaucoma   . Hyperlipidemia   . Hypertension   . Lumbar disc disease   . Small bowel obstruction ([place])   . Vitamin B 12 deficiency     Medications:  Enoxaparin 40 mg qdaily - last dose 06/18/2020 @ 1020  Assessment: Pharmacy consulted for heparin dosing in a patient with atrial fibrillation. Platelet count is 98. Will obtain baseline aPTT/INR.   Goal of Therapy:  Heparin level 0.3-0.7 units/ml Monitor platelets by anticoagulation protocol: Yes   Plan:  Baseline labs have been ordered  Heparin DW: 75.9 kg D/w Dr. Billie Ruddy to start heparin without a bolus d/t platelet. Start heparin infusion at 1050 units/hr Check anti-Xa level in 8 hours and daily while on heparin, per protocol Continue to monitor H&H and platelets   Etheridge Geil R Keevin Panebianco 06/18/2020,8:24 PM

## 2020-06-18 NOTE — Anesthesia Preprocedure Evaluation (Addendum)
Anesthesia Evaluation  Patient identified by MRN, date of birth, ID band Patient awake    Reviewed: Allergy & Precautions, H&P , NPO status , reviewed documented beta blocker date and time   Airway Mallampati: II  TM Distance: >3 FB Neck ROM: Full    Dental  (+)    Pulmonary shortness of breath, asthma , pneumonia,     + decreased breath sounds      Cardiovascular hypertension, (-) PND + dysrhythmias Atrial Fibrillation  Rhythm:Irregular Rate:Abnormal     Neuro/Psych    GI/Hepatic GERD  ,  Endo/Other  diabetes  Renal/GU Renal disease     Musculoskeletal  (+) Arthritis ,   Abdominal   Peds  Hematology   Anesthesia Other Findings Past Medical History: New onset Afib w RVR, manged w amiodarone/B Blocker. COVID pos on 9/9. Now w sepsis 2 to urinary obstruction/hydronephrosis.  No date: Arthritis No date: Asthma 1996: Cancer ([place])     Comment:  colon No date: Diabetes mellitus without complication (HCC) No date: GERD (gastroesophageal reflux disease) No date: Glaucoma No date: Hyperlipidemia No date: Hypertension No date: Lumbar disc disease No date: Small bowel obstruction ([place]) No date: Vitamin B 12 deficiency Past Surgical History: No date: ABDOMINAL HYSTERECTOMY No date: BACK SURGERY No date: COLON SURGERY 05/22/2017: COLONOSCOPY WITH PROPOFOL; N/A     Comment:  Procedure: COLONOSCOPY WITH PROPOFOL;  Surgeon: Manya Silvas, MD;  Location: [facility] ENDOSCOPY;  Service:               Endoscopy;  Laterality: N/A; No date: FRACTURE SURGERY No date: KNEE ARTHROSCOPY No date: LUMBAR LAMINECTOMY BMI    Body Mass Index: 28.54 kg/m     Reproductive/Obstetrics                           Anesthesia Physical Anesthesia Plan  ASA: IV and emergent  Anesthesia Plan: General   Post-op Pain Management:    Induction: Intravenous  PONV Risk Score and Plan: 3 and Ondansetron,  Treatment may vary due to age or medical condition, Propofol infusion and Midazolam  Airway Management Planned: Natural Airway and Nasal Cannula  Additional Equipment:   Intra-op Plan:   Post-operative Plan: Extubation in OR  Informed Consent: I have reviewed the patients History and Physical, chart, labs and discussed the procedure including the risks, benefits and alternatives for the proposed anesthesia with the patient or authorized representative who has indicated his/her understanding and acceptance.     Dental Advisory Given  Plan Discussed with: CRNA and Surgeon  Anesthesia Plan Comments:        Anesthesia Quick Evaluation

## 2020-06-18 NOTE — ED Notes (Signed)
diltizam IV infiltrated Switched to another line, anna rn msg admit

## 2020-06-18 NOTE — Progress Notes (Signed)
Anticoagulation monitoring(Lovenox):  79 yo  female ordered Lovenox 40 mg Q24h  Filed Weights   06/17/20 0352 06/18/20 0625  Weight: 75.3 kg (166 lb) 80.2 kg (176 lb 12.9 oz)   Body mass index is 28.54 kg/m.   Lab Results  Component Value Date   CREATININE 1.75 (H) 06/18/2020   CREATININE 1.37 (H) 06/17/2020   CREATININE 1.57 (H) 06/17/2020   Estimated Creatinine Clearance: 28.8 mL/min (A) (by C-G formula based on SCr of 1.75 mg/dL (H)). Hemoglobin & Hematocrit     Component Value Date/Time   HGB 11.1 (L) 06/18/2020 0809   HGB 10.4 (L) 01/14/2015 0457   HCT 31.5 (L) 06/18/2020 0809   HCT 33.7 (L) 06/23/2014 0940     Per Protocol for Patient with estCrcl < 30 ml/min and BMI < 40, will transition to Lovenox 30 mg Q24h.

## 2020-06-18 NOTE — Consult Note (Addendum)
Cardiology Consultation:   Stephanie [name] ID: Stephanie [name] MRN: [id number]; DOB: 1941/09/02  Admit date: 06/17/2020 Date of Consult: 06/18/2020  Primary Care Provider: Rusty Aus, MD Primary Cardiologist: Iverson Alamin, Dr. Garen Lah rounding Primary Electrophysiologist:  None    Stephanie [name] Profile:   Stephanie [name] is a 79 y.o. female with a hx of newly diagnosed atrial fibrillation with RVR, COVID-19 infection with history of vaccination with Pfizer vaccine, immune suppression due to RA, DM2, hypertension, hyperlipidemia, remote history of colon cancer, and who is being seen today for the evaluation of new atrial fibrillation with RVR in the setting of COVID-19 infection at the request of Dr. Billie Ruddy.  History of Present Illness:   Stephanie [name] is a 79 year old female with PMH as above.  Stephanie [name] denies any previous history of arrhythmia or cardiac disease.  Stephanie [name] has never been to a cardiologist.  In addition, Stephanie [name] denies any family history of cardiac disease/heart failure or arrhythmia.  In order to reduce exposure during the COVID-19 pandemic, the below history was obtained by calling into the room and via chart biopsy.  Unfortunately, the Stephanie [name] was unable to hear me very well on the phone and was not the best historian.  Stephanie [name] reports that Stephanie [name] tested positive for Covid 19 5 days earlier, despite being fully vaccinated with the [place] vaccine.  Over the last few days, Stephanie [name] has felt progressively weak with worsening shortness of breath.  On 9/17, Stephanie [name] was unable to get up, which was concerning to Stephanie [name] husband.  Stephanie [name] was noted to be febrile with a fever of 104 per EMS.  Leading up to Stephanie [name] admission, Stephanie [name] denies any chest pain, racing heart rate, presyncope, or syncope.  Stephanie [name] denies any recent falls.    Stephanie [name] presented to [facility] ED for further evaluation.initial vitals showed Stephanie [name] febrile 103.2 F, 106 bpm, RR 22, BP 136/63, 95% ORA.  Weight 75.3 kg. Stephanie [name] was noted to have short runs of SVT, which reportedly resolved.   Initial labs showed potassium 3.9, creatinine 1.57, BUN 38, AST 27, ALT 21, BNP 61, procalcitonin 17.78, lactic acid 3.1, hemoglobin 11.8, hematocrit 34.2, WBC 20.4, platelets 144, D-dimer elevated greater than 7500.  CXR showed no acute infiltrate or edema.  Chest CT was without evidence of pulmonary embolus and showed CAD and severe right hydronephrosis, worsened since October 2020.  CT renal stone study as above showing progressive R UPJ obstruction and findings as below, as well as also showing aortic and cardiac atherosclerotic vascular calcifications.  Postoperative changes in the thoracolumbar spine also noted. Stephanie [name] received IV magnesium for possible electrolyte abnormality.  Stephanie [name] also received IVF.  Initial EKG showed sinus tachycardia, 106 bpm, incomplete RBBB and LAFB, inferior ischemia / poor R wave progression in the inferior leads, poor R wave progression in the precordial leads. Subsequent EKG showed ST with short runs of SVT, followed by atrial fibrillation with rapid ventricular rate, 189 bpm, ischemia noted in the inferior leads. HS Tn 61  71.   At this time, and during our call, Stephanie [name] denied CP, racing heart rate, or palpitations.  Heart Pathway Score:     Past Medical History:  Diagnosis Date  . Arthritis   . Asthma   . Cancer ([place]) 1996   colon  . Diabetes mellitus without complication ([place])   . GERD (gastroesophageal reflux disease)   . Glaucoma   . Hyperlipidemia   . Hypertension   . Lumbar disc disease   . Small bowel obstruction ([place])   .  Vitamin B 12 deficiency     Past Surgical History:  Procedure Laterality Date  . ABDOMINAL HYSTERECTOMY    . BACK SURGERY    . COLON SURGERY    . COLONOSCOPY WITH PROPOFOL N/A 05/22/2017   Procedure: COLONOSCOPY WITH PROPOFOL;  Surgeon: Manya Silvas, MD;  Location[facility] ENDOSCOPY;  Service: Endoscopy;  Laterality: N/A;  . FRACTURE SURGERY    . KNEE ARTHROSCOPY    . LUMBAR LAMINECTOMY       Home Medications:  Prior to  Admission medications   Medication Sig Start Date End Date Taking? Authorizing Provider  amLODipine (NORVASC) 10 MG tablet Take 5 mg by mouth 2 (two) times daily.    Yes [provider]  brimonidine (ALPHAGAN) 0.2 % ophthalmic solution Place 1 drop into both eyes 2 (two) times daily. 05/31/20  Yes [provider]  Cranberry 400 MG CAPS Take by mouth. 03/24/20  Yes [provider]  estradiol (ESTRACE) 0.1 MG/GM vaginal cream Place 1 Applicatorful vaginally at bedtime.   Yes [provider]  glipiZIDE (GLUCOTROL) 10 MG tablet Take 10 mg by mouth daily before breakfast.   Yes [provider]  hydrochlorothiazide (HYDRODIURIL) 25 MG tablet Take 25 mg by mouth daily.   Yes [provider]  leflunomide (ARAVA) 10 MG tablet Take 10 mg by mouth daily.   Yes [provider]  metFORMIN (GLUCOPHAGE-XR) 500 MG 24 hr tablet Take 500 mg by mouth 2 (two) times daily.   Yes [provider]  metoprolol tartrate (LOPRESSOR) 50 MG tablet Take 50 mg by mouth 2 (two) times daily.   Yes [provider]  omeprazole (PRILOSEC) 40 MG capsule Take 40 mg by mouth daily.   Yes [provider]  potassium chloride SA (K-DUR,KLOR-CON) 20 MEQ tablet Take 20 mEq by mouth daily.   Yes [provider]  predniSONE (DELTASONE) 20 MG tablet Take 40 mg by mouth daily with breakfast. 06/09/20 06/19/20 Yes [provider]  Probiotic Product (PROBIOTIC-10 PO) Take 1 Dose by mouth daily.    Yes [provider]  rosuvastatin (CRESTOR) 10 MG tablet Take by mouth. 03/24/20  Yes [provider]  tacrolimus (PROTOPIC) 0.1 % ointment Apply 1 application topically 2 (two) times daily as needed (rash).    Yes [provider]  tamsulosin (FLOMAX) 0.4 MG CAPS capsule TAKE 1 CAPSULE(0.4 MG) BY MOUTH DAILY Stephanie [name] taking differently: Take 0.4 mg by mouth daily.  05/27/20  Yes McGowan, Larene Beach A, PA-C  vitamin B-12  (CYANOCOBALAMIN) 1000 MCG tablet Take 1,000 mcg by mouth daily.   Yes [provider]  CONTOUR TEST test strip 1 each by Other route daily.  03/09/18   [provider]    Inpatient Medications: Scheduled Meds: . acidophilus  1 capsule Oral Daily  . amiodarone  150 mg Intravenous Once  . amLODipine  5 mg Oral BID  . vitamin C  500 mg Oral Daily  . brimonidine  1 drop Both Eyes BID  . [START ON 06/19/2020] Chlorhexidine Gluconate Cloth  6 each Topical Q0600  . diltiazem  10 mg Intravenous Once  . enoxaparin (LOVENOX) injection  40 mg Subcutaneous Q24H  . insulin aspart  0-20 Units Subcutaneous TID AC & HS  . insulin detemir  0.1 Units/kg Subcutaneous BID  . mouth rinse  15 mL Mouth Rinse BID  . metoprolol tartrate  50 mg Oral BID  . pantoprazole  40 mg Oral Daily  . rosuvastatin  10 mg Oral  Daily  . sodium chloride flush  3 mL Intravenous Q12H  . tamsulosin  0.4 mg Oral Daily  . vitamin B-12  1,000 mcg Oral Daily  . zinc sulfate  220 mg Oral Daily   Continuous Infusions: . sodium chloride    . sodium chloride 50 mL/hr at 06/18/20 0658  . amiodarone     Followed by  . amiodarone    . cefTRIAXone (ROCEPHIN)  IV Stopped (06/17/20 1830)  . diltiazem (CARDIZEM) infusion 15 mg/hr (06/18/20 1024)  . remdesivir 100 mg in NS 100 mL 100 mg (06/18/20 1025)   PRN Meds: sodium chloride, acetaminophen, guaiFENesin-dextromethorphan, ondansetron (ZOFRAN) IV, sodium chloride flush  Allergies:    Allergies  Allergen Reactions  . Ibuprofen Itching  . Indomethacin   . Infliximab   . Methotrexate Derivatives   . Moexipril   . Percocet [Oxycodone-Acetaminophen] Itching  . Naprosyn [Naproxen] Rash    Social History:   Social History   Socioeconomic History  . Marital status: Married    Spouse name: Not on file  . Number of children: Not on file  . Years of education: Not on file  . Highest education level: Not on file  Occupational History  . Not on file  Tobacco  Use  . Smoking status: Never Smoker  . Smokeless tobacco: Never Used  Vaping Use  . Vaping Use: Never used  Substance and Sexual Activity  . Alcohol use: No  . Drug use: No  . Sexual activity: Yes  Other Topics Concern  . Not on file  Social History Narrative  . Not on file   Social Determinants of Health   Financial Resource Strain:   . Difficulty of Paying Living Expenses: Not on file  Food Insecurity:   . Worried About Charity fundraiser in the Last Year: Not on file  . Ran Out of Food in the Last Year: Not on file  Transportation Needs:   . Lack of Transportation (Medical): Not on file  . Lack of Transportation (Non-Medical): Not on file  Physical Activity:   . Days of Exercise per Week: Not on file  . Minutes of Exercise per Session: Not on file  Stress:   . Feeling of Stress : Not on file  Social Connections:   . Frequency of Communication with Friends and Family: Not on file  . Frequency of Social Gatherings with Friends and Family: Not on file  . Attends Religious Services: Not on file  . Active Member of Clubs or Organizations: Not on file  . Attends Archivist Meetings: Not on file  . Marital Status: Not on file  Intimate Partner Violence:   . Fear of Current or Ex-Partner: Not on file  . Emotionally Abused: Not on file  . Physically Abused: Not on file  . Sexually Abused: Not on file    Family History:   Denies any family history of cardiac disease or arrhythmia. Family History  Problem Relation Age of Onset  . Breast cancer Neg Hx      ROS:  Please see the history of present illness.  Review of Systems  Unable to perform ROS: Acuity of condition  Constitutional: Positive for malaise/fatigue.  Respiratory: Positive for shortness of breath.   Cardiovascular: Negative for chest pain, palpitations, orthopnea, leg swelling and PND.  Neurological: Negative for focal weakness.    All other ROS reviewed and negative.     Physical Exam/Data:     Vitals:   06/18/20 0939 06/18/20  1000 06/18/20 1100 06/18/20 1117  BP:  (!) 164/97 (!) 154/69   Pulse:  (!) 114 (!) 110 (!) 101  Resp:  (!) 22 (!) 26 (!) 25  Temp:      TempSrc:      SpO2: 96%  96% 95%  Weight:      Height:        Intake/Output Summary (Last 24 hours) at 06/18/2020 1135 Last data filed at 06/18/2020 0700 Gross per 24 hour  Intake 2783.09 ml  Output 2700 ml  Net 83.09 ml   Last 3 Weights 06/18/2020 06/17/2020 05/05/2020  Weight (lbs) 176 lb 12.9 oz 166 lb 170 lb  Weight (kg) 80.2 kg 75.297 kg 77.111 kg     Body mass index is 28.54 kg/m.  Defer physical exam to MD attestation in the setting of the COVID-19 pandemic.   EKG:  The EKG was personally reviewed and demonstrates:  Initial EKG showed sinus tachycardia, 106 bpm, incomplete RBBB and LAFB, inferior ischemia / poor R wave progression in the inferior leads, poor R wave progression in the precordial leads. Subsequent EKG showed ST with short runs of SVT, followed by atrial fibrillation with rapid ventricular rate, 189 bpm, ischemia noted in the inferior leads. Telemetry:  Telemetry was personally reviewed and demonstrates:  NSR, ST, SVT, Afib with RVR  Relevant CV Studies: None  Laboratory Data:  High Sensitivity Troponin:   Recent Labs  Lab 06/17/20 0357 06/17/20 1101  TROPONINIHS 61* 71*     Cardiac EnzymesNo results for input(s): TROPONINI in the last 168 hours. No results for input(s): TROPIPOC in the last 168 hours.  Chemistry Recent Labs  Lab 06/17/20 0357 06/17/20 1938 06/18/20 0809  NA 133* 135 135  K 3.9 3.3* 3.3*  CL 102 102 106  CO2 18* 20* 20*  GLUCOSE 344* 203* 97  BUN 38* 28* 28*  CREATININE 1.57* 1.37* 1.75*  CALCIUM 8.9 8.4* 8.1*  GFRNONAA 31* 37* 28*  GFRAA 36* 43* 32*  ANIONGAP 13 13 9     Recent Labs  Lab 06/17/20 0357 06/18/20 0809  PROT 6.2* 5.3*  ALBUMIN 3.0* 2.2*  AST 27 26  ALT 21 15  ALKPHOS 83 84  BILITOT 1.0 0.6   Hematology Recent Labs  Lab  06/17/20 0357 06/18/20 0809  WBC 20.4* 13.7*  RBC 4.15 3.91  HGB 11.8* 11.1*  HCT 34.2* 31.5*  MCV 82.4 80.6  MCH 28.4 28.4  MCHC 34.5 35.2  RDW 14.1 14.5  PLT 144* 97*   BNP Recent Labs  Lab 06/18/20 0809  BNP 410.1*    DDimer No results for input(s): DDIMER in the last 168 hours.   Radiology/Studies:  CT Angio Chest PE W and/or Wo Contrast  Result Date: 06/17/2020 CLINICAL DATA:  Positive D-dimer.  COVID. EXAM: CT ANGIOGRAPHY CHEST WITH CONTRAST TECHNIQUE: Multidetector CT imaging of the chest was performed using the standard protocol during bolus administration of intravenous contrast. Multiplanar CT image reconstructions and MIPs were obtained to evaluate the vascular anatomy. CONTRAST:  5mL OMNIPAQUE IOHEXOL 350 MG/ML SOLN COMPARISON:  07/05/2019 FINDINGS: Cardiovascular: No filling defects in the pulmonary arteries to suggest pulmonary emboli. Heart is normal size. Aortic atherosclerosis. No aneurysm. Coronary artery calcifications. Mediastinum/Nodes: No mediastinal, hilar, or axillary adenopathy. Trachea and esophagus are unremarkable. Thyroid unremarkable. Lungs/Pleura: No confluent opacities or effusions. Upper Abdomen: Severe right hydronephrosis, slightly worsened from abdominal CT 07/05/2019. Musculoskeletal: Chest wall soft tissues are unremarkable. No acute bony abnormality. Review of the MIP images confirms the  above findings. IMPRESSION: No acute bony abnormality. Postoperative changes in the thoracolumbar spine. No evidence of pulmonary embolus. Coronary artery disease. Severe right hydronephrosis, worsening since October 2020. Electronically Signed   By: Rolm Baptise M.D.   On: 06/17/2020 07:38   DG Chest Portable 1 View  Result Date: 06/17/2020 CLINICAL DATA:  COVID with shortness of breath EXAM: PORTABLE CHEST 1 VIEW COMPARISON:  07/08/2019 FINDINGS: Normal heart size and mediastinal contours. No acute infiltrate or edema. No effusion or pneumothorax. No acute  osseous findings. Artifact from EKG leads. Thoracolumbar posterior fusion hardware. IMPRESSION: Negative for pneumonia. Electronically Signed   By: Monte Fantasia M.D.   On: 06/17/2020 04:42   CT RENAL STONE STUDY  Result Date: 06/17/2020 CLINICAL DATA:  Hydronephrosis, acute kidney injury EXAM: CT ABDOMEN AND PELVIS WITHOUT CONTRAST TECHNIQUE: Multidetector CT imaging of the abdomen and pelvis was performed following the standard protocol without IV contrast. COMPARISON:  CTA of the chest obtained earlier today; prior CT scan of the abdomen and pelvis 07/05/2019 FINDINGS: Lower chest: No acute abnormality. Small hiatal hernia. Atherosclerotic calcifications visualized along the coronary arteries. The heart is normal in size. Hepatobiliary: Relatively low attenuation of the hepatic parenchyma consistent with steatosis. No discrete hepatic lesion. High attenuation material within the gallbladder may represent sludge and/or small stones. Pancreas: Unremarkable. No pancreatic ductal dilatation or surrounding inflammatory changes. Spleen: Normal in size without focal abnormality. Adrenals/Urinary Tract: Normal adrenal glands. Nonspecific perinephric stranding bilaterally. Moderate right-sided hydronephrosis terminating at the ureteropelvic junction is slightly progressed compared to prior imaging from October of 2020. Filling defects are present within the dependent portion of the renal collecting system. The ureter itself appears patent to the bladder. The bladder is distended and contains a large amount of air. Additionally, there is some debris layering dependently. Striated nephrogram throughout the right kidney favored to represent delayed nephrogram secondary to UPJ obstruction. Bilateral circumscribed low-attenuation renal lesions demonstrate no significant interval change in most likely represent simple and minimally complex renal cysts. Stomach/Bowel: No acute abnormality. Moderate rectal stool burden  suggests constipation. Evidence of prior colonic and enteric surgery. No obstruction. Vascular/Lymphatic: Limited evaluation in the absence of intravenous contrast. Atherosclerotic vascular calcifications throughout the abdominal aorta and branch arteries. No suspicious lymphadenopathy. Reproductive: No acute abnormality. Other: No abdominal wall hernia or abnormality. No abdominopelvic ascites. Musculoskeletal: Advanced thoracolumbosacral posterior interbody fusion with multilevel screw and rod construct. No evidence of acute abnormality. IMPRESSION: 1. 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 2. Debris present dependently within the right renal collecting system and bladder. This may represent thrombus in the setting of hematuria, mucinous debris, or purulent debris if the Stephanie [name] has evidence of an active urinary tract infection. Ultimately, cystoscopy should be considered after resolution of the Stephanie [name]'s acute symptoms to exclude the less likely possibility of a bladder wall mass. 3. Bladder distension with air/gas floating in the fundus. This may be secondary to recent instrumentation (catheter insertion), or be reflective of infection with a gas-forming organism. Recommend correlation with urinalysis. 4. Nonspecific perinephric stranding bilaterally consistent with the history of acute kidney injury. 5. Moderate rectal stool burden consistent with constipation. 6. Small hiatal hernia. 7. Aortic and cardiac atherosclerotic vascular calcifications. Electronically Signed   By: Jacqulynn Cadet M.D.   On: 06/17/2020 11:04    Assessment and Plan:   New onset Atrial Fibrillation with RVR --Reports weakness in the setting of COVID-19 infection.  Denies any racing heart rate or palpitations.  Denies any personal  or family history of arrhythmia or cardiac disease.  Suspect that Stephanie [name] current infection triggered Stephanie [name] atrial fibrillation. --Check TSH.   --Replete electrolytes with  potassium goal 4.0 and magnesium goal 2.0.   --Daily BMET. --Started on IV amiodarone infusion. Recommend caution with amiodarone, given Stephanie [name] is not therapeutically anticoagulated.  --EF unknown; therefore, recommend discontinue diltiazem and instead transition to metoprolol and titrate as needed  for goal ventricular rate below 110.  --Expect that rates will likely be difficult to control in the setting of Stephanie [name] illness and improve with recovery from illness.  --Current renal function precludes IV digoxin. Avoid. --Once ventricular rate is well controlled, obtain echocardiogram to assess EF, wall motion, and rule out any acute structural changes.  Current rate is elevated; therefore, will defer for now given any echo with elevated rates will result in sub-optimal images. --CHA2DS2VASc score of at least 6 (HTN, agex2, DM2, vascular, female).  Daily CBC.  If blood counts stable, and no s/sx of bleeding, start IV heparin after Stephanie [name] urology procedure if safe to do so per urology. Given Stephanie [name] CHA2DS2VASc, also recommend indefinite [place] at discharge with NOAC to prevent risk of thromboembolic event with consideration of risks versus benefits at that time.  --No current plan for DCCV given not therapeutically anticoagulated.  If rates continue to improve difficult to control, and Stephanie [name] remains in atrial fibrillation with RVR, could consider TEE/DCCV if able to start on anticoagulation after that time. Once therapeutically anticoagulated and as an outpatient, if still in Afib in clinic, could consider DCCV at that time. Continue to defer for now.  Hypokalemia --Potassium 3.3.  Replete with goal 4.0.  Check magnesium.  Electrolyte abnormalities can contribute to arrhythmias.  Hypertension --Continue current medications.   CKD --Daily BMET.   For questions or updates, please contact [place] Please consult [email or web address] for contact info under     Signed, Arvil Chaco, PA-C  06/18/2020 11:35 AM

## 2020-06-18 NOTE — Progress Notes (Addendum)
Subjective: Deshanna had initial improvement in her Cr on admission but her Cr is now up to 1.75 from 1.37 after declining to 1.57 on admission.   She denies flank pain but on exam has some flank tenderness.   The foley is draining well and she has good UOP.  She has a persistent cough and malaise.  ROS:  Review of Systems  Constitutional: Positive for fever and malaise/fatigue.  Respiratory: Positive for cough.   Gastrointestinal: Negative for abdominal pain.  Genitourinary: Negative for flank pain.    Anti-infectives: Anti-infectives (From admission, onward)   Start     Dose/Rate Route Frequency Ordered Stop   06/18/20 1800  cefTRIAXone (ROCEPHIN) 2 g in sodium chloride 0.9 % 100 mL IVPB  Status:  Discontinued        2 g 200 mL/hr over 30 Minutes Intravenous Every 24 hours 06/17/20 1545 06/17/20 1545   06/18/20 1000  remdesivir 100 mg in sodium chloride 0.9 % 100 mL IVPB       "Followed by" Linked Group Details   100 mg 200 mL/hr over 30 Minutes Intravenous Daily 06/17/20 0809 06/22/20 0959   06/18/20 0800  azithromycin (ZITHROMAX) 500 mg in sodium chloride 0.9 % 250 mL IVPB  Status:  Discontinued        500 mg 250 mL/hr over 60 Minutes Intravenous Every 24 hours 06/17/20 0805 06/17/20 0914   06/17/20 1800  cefTRIAXone (ROCEPHIN) 2 g in sodium chloride 0.9 % 100 mL IVPB        2 g 200 mL/hr over 30 Minutes Intravenous Every 24 hours 06/17/20 1545     06/17/20 1500  cefTRIAXone (ROCEPHIN) 2 g in sodium chloride 0.9 % 100 mL IVPB  Status:  Discontinued        2 g 200 mL/hr over 30 Minutes Intravenous Every 24 hours 06/17/20 0805 06/17/20 1545   06/17/20 1000  remdesivir 200 mg in sodium chloride 0.9% 250 mL IVPB       "Followed by" Linked Group Details   200 mg 580 mL/hr over 30 Minutes Intravenous Once 06/17/20 0809 06/17/20 1216   06/17/20 0530  ceFEPIme (MAXIPIME) 1 g in sodium chloride 0.9 % 100 mL IVPB        1 g 200 mL/hr over 30 Minutes Intravenous  Once 06/17/20 0515  06/17/20 0810   06/17/20 0530  vancomycin (VANCOCIN) IVPB 1000 mg/200 mL premix        1,000 mg 200 mL/hr over 60 Minutes Intravenous  Once 06/17/20 0515 06/17/20 0929   06/17/20 0530  azithromycin (ZITHROMAX) tablet 500 mg        500 mg Oral  Once 06/17/20 0515 06/17/20 0723      Current Facility-Administered Medications  Medication Dose Route Frequency Provider Last Rate Last Admin  . 0.9 %  sodium chloride infusion  250 mL Intravenous PRN Agbata, Tochukwu, MD      . 0.9 %  sodium chloride infusion   Intravenous Continuous Sharion Settler, NP 50 mL/hr at 06/18/20 0658 New Bag at 06/18/20 0960  . acetaminophen (TYLENOL) tablet 650 mg  650 mg Oral Q6H PRN Agbata, Tochukwu, MD   650 mg at 06/18/20 0431  . acidophilus (RISAQUAD) capsule 1 capsule  1 capsule Oral Daily Agbata, Tochukwu, MD   1 capsule at 06/17/20 1026  . amLODipine (NORVASC) tablet 5 mg  5 mg Oral BID Agbata, Tochukwu, MD   5 mg at 06/17/20 1027  . ascorbic acid (VITAMIN C) tablet 500 mg  500  mg Oral Daily Agbata, Tochukwu, MD   500 mg at 06/17/20 1028  . brimonidine (ALPHAGAN) 0.2 % ophthalmic solution 1 drop  1 drop Both Eyes BID Agbata, Tochukwu, MD   1 drop at 06/17/20 2136  . cefTRIAXone (ROCEPHIN) 2 g in sodium chloride 0.9 % 100 mL IVPB  2 g Intravenous Q24H Berton Mount, RPH   Paused at 06/17/20 1830  . [START ON 06/19/2020] Chlorhexidine Gluconate Cloth 2 % PADS 6 each  6 each Topical Q0600 Athena Masse, MD      . diltiazem (CARDIZEM) 125 mg in dextrose 5% 125 mL (1 mg/mL) infusion  5-15 mg/hr Intravenous Titrated Sharion Settler, NP 15 mL/hr at 06/18/20 0659 15 mg/hr at 06/18/20 0659  . diltiazem (CARDIZEM) injection 10 mg  10 mg Intravenous Once Harvest Dark, MD      . enoxaparin (LOVENOX) injection 40 mg  40 mg Subcutaneous Q24H Agbata, Tochukwu, MD   40 mg at 06/17/20 1032  . guaiFENesin-dextromethorphan (ROBITUSSIN DM) 100-10 MG/5ML syrup 10 mL  10 mL Oral Q4H PRN Agbata, Tochukwu, MD   10 mL at  06/18/20 0435  . insulin aspart (novoLOG) injection 0-20 Units  0-20 Units Subcutaneous TID AC & HS Sharion Settler, NP   3 Units at 06/17/20 2220  . insulin detemir (LEVEMIR) injection 8 Units  0.1 Units/kg Subcutaneous BID Agbata, Tochukwu, MD   8 Units at 06/17/20 2212  . MEDLINE mouth rinse  15 mL Mouth Rinse BID Judd Gaudier V, MD      . metoprolol tartrate (LOPRESSOR) tablet 50 mg  50 mg Oral BID Agbata, Tochukwu, MD   50 mg at 06/17/20 1027  . ondansetron (ZOFRAN) injection 4 mg  4 mg Intravenous Q6H PRN Agbata, Tochukwu, MD   4 mg at 06/17/20 1839  . pantoprazole (PROTONIX) EC tablet 40 mg  40 mg Oral Daily Agbata, Tochukwu, MD   40 mg at 06/17/20 1027  . potassium chloride SA (KLOR-CON) CR tablet 40 mEq  40 mEq Oral Once Enzo Bi, MD      . remdesivir 100 mg in sodium chloride 0.9 % 100 mL IVPB  100 mg Intravenous Daily Agbata, Tochukwu, MD      . rosuvastatin (CRESTOR) tablet 10 mg  10 mg Oral Daily Agbata, Tochukwu, MD   10 mg at 06/17/20 1026  . sodium chloride flush (NS) 0.9 % injection 3 mL  3 mL Intravenous Q12H Agbata, Tochukwu, MD   3 mL at 06/17/20 2028  . sodium chloride flush (NS) 0.9 % injection 3 mL  3 mL Intravenous PRN Agbata, Tochukwu, MD      . tamsulosin (FLOMAX) capsule 0.4 mg  0.4 mg Oral Daily Agbata, Tochukwu, MD   0.4 mg at 06/17/20 1028  . vitamin B-12 (CYANOCOBALAMIN) tablet 1,000 mcg  1,000 mcg Oral Daily Agbata, Tochukwu, MD   1,000 mcg at 06/17/20 1026  . zinc sulfate capsule 220 mg  220 mg Oral Daily Agbata, Tochukwu, MD   220 mg at 06/17/20 1027     Objective: Vital signs in last 24 hours: Temp:  [100.6 F (38.1 C)-103.2 F (39.6 C)] 100.6 F (38.1 C) (09/18 0625) Pulse Rate:  [52-196] 102 (09/18 0800) Resp:  [17-37] 21 (09/18 0800) BP: (105-199)/(55-158) 119/65 (09/18 0800) SpO2:  [91 %-100 %] 96 % (09/18 0939) Weight:  [80.2 kg] 80.2 kg (09/18 0625)  Intake/Output from previous day: 09/17 0701 - 09/18 0700 In: 4883.1 [I.V.:2216.2; IV  Piggyback:2666.9] Out: 2700 [Urine:2700] Intake/Output this shift: No  intake/output data recorded.   Physical Exam Vitals reviewed.  Constitutional:      Appearance: She is well-developed. She is ill-appearing.  Abdominal:     Palpations: Abdomen is soft.     Tenderness: There is abdominal tenderness (mild RUQ).  Neurological:     Mental Status: She is alert.     Lab Results:  Recent Labs    06/17/20 0357  WBC 20.4*  HGB 11.8*  HCT 34.2*  PLT 144*   BMET Recent Labs    06/17/20 1938 06/18/20 0809  NA 135 135  K 3.3* 3.3*  CL 102 106  CO2 20* 20*  GLUCOSE 203* 97  BUN 28* 28*  CREATININE 1.37* 1.75*  CALCIUM 8.4* 8.1*   PT/INR No results for input(s): LABPROT, INR in the last 72 hours. ABG No results for input(s): PHART, HCO3 in the last 72 hours.  Invalid input(s): PCO2, PO2  Studies/Results: CT Angio Chest PE W and/or Wo Contrast  Result Date: 06/17/2020 CLINICAL DATA:  Positive D-dimer.  COVID. EXAM: CT ANGIOGRAPHY CHEST WITH CONTRAST TECHNIQUE: Multidetector CT imaging of the chest was performed using the standard protocol during bolus administration of intravenous contrast. Multiplanar CT image reconstructions and MIPs were obtained to evaluate the vascular anatomy. CONTRAST:  75mL OMNIPAQUE IOHEXOL 350 MG/ML SOLN COMPARISON:  07/05/2019 FINDINGS: Cardiovascular: No filling defects in the pulmonary arteries to suggest pulmonary emboli. Heart is normal size. Aortic atherosclerosis. No aneurysm. Coronary artery calcifications. Mediastinum/Nodes: No mediastinal, hilar, or axillary adenopathy. Trachea and esophagus are unremarkable. Thyroid unremarkable. Lungs/Pleura: No confluent opacities or effusions. Upper Abdomen: Severe right hydronephrosis, slightly worsened from abdominal CT 07/05/2019. Musculoskeletal: Chest wall soft tissues are unremarkable. No acute bony abnormality. Review of the MIP images confirms the above findings. IMPRESSION: No acute bony  abnormality. Postoperative changes in the thoracolumbar spine. No evidence of pulmonary embolus. Coronary artery disease. Severe right hydronephrosis, worsening since October 2020. Electronically Signed   By: Rolm Baptise M.D.   On: 06/17/2020 07:38   DG Chest Portable 1 View  Result Date: 06/17/2020 CLINICAL DATA:  COVID with shortness of breath EXAM: PORTABLE CHEST 1 VIEW COMPARISON:  07/08/2019 FINDINGS: Normal heart size and mediastinal contours. No acute infiltrate or edema. No effusion or pneumothorax. No acute osseous findings. Artifact from EKG leads. Thoracolumbar posterior fusion hardware. IMPRESSION: Negative for pneumonia. Electronically Signed   By: Monte Fantasia M.D.   On: 06/17/2020 04:42   CT RENAL STONE STUDY  Result Date: 06/17/2020 CLINICAL DATA:  Hydronephrosis, acute kidney injury EXAM: CT ABDOMEN AND PELVIS WITHOUT CONTRAST TECHNIQUE: Multidetector CT imaging of the abdomen and pelvis was performed following the standard protocol without IV contrast. COMPARISON:  CTA of the chest obtained earlier today; prior CT scan of the abdomen and pelvis 07/05/2019 FINDINGS: Lower chest: No acute abnormality. Small hiatal hernia. Atherosclerotic calcifications visualized along the coronary arteries. The heart is normal in size. Hepatobiliary: Relatively low attenuation of the hepatic parenchyma consistent with steatosis. No discrete hepatic lesion. High attenuation material within the gallbladder may represent sludge and/or small stones. Pancreas: Unremarkable. No pancreatic ductal dilatation or surrounding inflammatory changes. Spleen: Normal in size without focal abnormality. Adrenals/Urinary Tract: Normal adrenal glands. Nonspecific perinephric stranding bilaterally. Moderate right-sided hydronephrosis terminating at the ureteropelvic junction is slightly progressed compared to prior imaging from October of 2020. Filling defects are present within the dependent portion of the renal collecting  system. The ureter itself appears patent to the bladder. The bladder is distended and contains a large amount  of air. Additionally, there is some debris layering dependently. Striated nephrogram throughout the right kidney favored to represent delayed nephrogram secondary to UPJ obstruction. Bilateral circumscribed low-attenuation renal lesions demonstrate no significant interval change in most likely represent simple and minimally complex renal cysts. Stomach/Bowel: No acute abnormality. Moderate rectal stool burden suggests constipation. Evidence of prior colonic and enteric surgery. No obstruction. Vascular/Lymphatic: Limited evaluation in the absence of intravenous contrast. Atherosclerotic vascular calcifications throughout the abdominal aorta and branch arteries. No suspicious lymphadenopathy. Reproductive: No acute abnormality. Other: No abdominal wall hernia or abnormality. No abdominopelvic ascites. Musculoskeletal: Advanced thoracolumbosacral posterior interbody fusion with multilevel screw and rod construct. No evidence of acute abnormality. IMPRESSION: 1. 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 2. Debris present dependently within the right renal collecting system and bladder. This may represent thrombus in the setting of hematuria, mucinous debris, or purulent debris if the patient has evidence of an active urinary tract infection. Ultimately, cystoscopy should be considered after resolution of the patient's acute symptoms to exclude the less likely possibility of a bladder wall mass. 3. Bladder distension with air/gas floating in the fundus. This may be secondary to recent instrumentation (catheter insertion), or be reflective of infection with a gas-forming organism. Recommend correlation with urinalysis. 4. Nonspecific perinephric stranding bilaterally consistent with the history of acute kidney injury. 5. Moderate rectal stool burden consistent with constipation. 6.  Small hiatal hernia. 7. Aortic and cardiac atherosclerotic vascular calcifications. Electronically Signed   By: Jacqulynn Cadet M.D.   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     The CT stone study demonstrated only partial obstruction on the right but with evidence of urinary retention.  The AKI has worsened and she has some flank pain on exam but the fever curve is improving on antibiotics and foley drainage.   With the active COVID infection, I will communicate with the hospitalist about the risk/benefit of observation vs stent insertion, but with the worsening renal function and pain on exam, a stent should be considered.    I discussed the situation with Dr. Billie Ruddy and we will proceed with cystoscopy and right ureteral stenting later today.   She had breakfast at 8:30a so the procedure will be delayed until 4pm.   I have spoken with her husband and reviewed this risks of bleeding, infection, ureteral injury, need for secondary procedure or a perc, thrombotic events and anesthetic complications.  We will try to do this under sedation.      LOS: 1 day    Irine Seal 06/18/2020 [phone] ID: [name], female   DOB: 13-May-1941, 79 y.o.   MRN: [id number]

## 2020-06-18 NOTE — Progress Notes (Addendum)
PROGRESS NOTE    Stephanie [name]  JOI:[id number] DOB: 10-24-1940 DOA: 06/17/2020 PCP: Rusty Aus, MD    Assessment & Plan:   Principal Problem:   Sepsis [facility]) Active Problems:   Benign essential hypertension   Chronic cystitis   Rheumatoid arthritis involving multiple sites with positive rheumatoid factor ([place])   Diabetes mellitus ([place])   Rheumatoid arthritis ([place])   COVID-19 virus infection   AKI (acute kidney injury) ([place])   Atrial fibrillation with rapid ventricular response ([place])   Essential hypertension   Pure hypercholesterolemia   Acute pyelonephritis   Hydronephrosis, right   Lactic acid acidosis   Hyperglycemia   Hx of essential hypertension   Immunosuppression due to drug therapy    Stephanie [name] is a 79 y.o. female with medical history significant for COVID-19 viral infection (positive PCR test June 09, 2020), history of diabetes mellitus, history of rheumatoid arthritis on chronic immunosuppressive therapy, hypertension and a remote history of colon cancer who was brought into the ER by EMS for evaluation of fever.  Patient had a T-max of 104F at home.  She complains of generalized weakness and worsening shortness of breath but denies having any nausea, no vomiting, no chest pain or abdominal pain.  Patient does self caths at home. Patient is fully vaccinated She was started on prednisone by her primary care provider for presumed COVID-19 bronchitis   # Sepsis 2/2 right pyelonephritis  --As evidenced by fever with a T-max of 103F, tachycardia, tachypnea, leukocytosis of 20,000 with a left shift and lactic acid of 3.1.  UA positive for infection.  Pt started on ceftriaxone. --CT renal showed "Debris present dependently within the right renal collecting system and bladder"   --Pt received 2L LR f/b MIVF PLAN:  --continue ceftriaxone (prior urine cx showed Kleb sensitive to ceftriaxone) --continue MIVF  # Severe right hydronephrosis --CT renal showed  "Progressive right UPJ obstruction compared to prior October of 2020." PLAN: --right stent placement by urology today (found frank pus in the kidney) --continue Foley   # New onset Afib w RVR, persistent --developed while in the ED.  HR 120's-140's despite multiple doses of IV dilt, IV metop, dilt gtt@15   --anticoagulation was not started due to upcoming urological procedure PLAN: --amio bolus+gtt --If HR sustained >120's, give IV dilt or IV metop PRN if BP >110 --continue lopressor 50 mg BID --cardiology consult  Addendum:  HR persistently in 120's-150's.  Not responding to PRN IV dilt or IV metop.  Have consulted pharm to start heparin gtt.  If pt becomes unstable (drops BP), please page oncall cardiology Dr. Jasmine Pang.   # COVID-19 infection --Pt is fully vaccinated.  Patient's COVID-19 PCR test was positive on 06/09/20.  No overt respiratory symptoms.  CT chest clear.  No real O2 requirement on presentation. --Remdesivir started on admission PLAN: --continue Remdesivir --monitor O2 sats  AKI --on admission creatinine is 1.5.  Baseline Cr 0.8.  AKI due to urinary obstruction and infection. --continue MIVF  Lactic acidosis 2/2 sepsis --resolved with IVF.  Hx Diabetes mellitus  hyperglycemia related to systemic steroid administration Patient was started on prednisone as an outpatient by her primary care provider for presumed COVID-19 bronchitis Chest x-ray is negative for pneumonia and patient is not hypoxic Prednisone d/c'ed. PLAN: Optimize glycemic control with sliding scale coverage with insulin  Hx of HTN --BP drops sometimes from a lot of rate control agents --Hold home amlodipine  History of rheumatoid arthritis --continue to hold Arava due  to sepsis    DVT prophylaxis: Lovenox SQ Code Status: DNR  Family Communication:  Status is: inpatient Dispo:   The patient is from: home Anticipated d/c is to: home Anticipated d/c date is: >3 days Patient  currently is not medically stable to d/c due to: Severe sepsis, right hydro and pyelo requiring IV abx, new onset Afib w RVR not yet rate controlled.    Subjective and Interval History:  Pt has been in Afib w RVR, despite multiple doses of IV dilt and IV metop.  Amiodarone bolus and gtt started, and after bolus, HR still 120's-140's.    Pt received stent placement today for severe right hydro.  Pus found in the kidney.   Objective: Vitals:   06/18/20 1300 06/18/20 1600 06/18/20 1743 06/18/20 1746  BP: 113/63  118/85 131/80  Pulse: 91     Resp: 18 (!) 27 (!) 25 (!) 23  Temp:    100.2 F (37.9 C)  TempSrc:      SpO2: 97%     Weight:      Height:        Intake/Output Summary (Last 24 hours) at 06/18/2020 1844 Last data filed at 06/18/2020 1709 Gross per 24 hour  Intake 2844.17 ml  Output 1700 ml  Net 1144.17 ml   Filed Weights   06/17/20 0352 06/18/20 0625  Weight: 75.3 kg 80.2 kg    Examination:   Constitutional: NAD, somnolent but arousable, oriented HEENT: conjunctivae and lids normal, EOMI, very hard-of-hearing CV: irregular, tachycardic, in Afib. Distal pulses +2.  No cyanosis.   RESP: CTA B/L over anterior, normal respiratory effort GI: +BS, large abdomen but soft Extremities: No effusions, edema, or tenderness in BLE SKIN: warm, dry and intact Neuro: II - XII grossly intact.  Sensation intact    Data Reviewed: I have personally reviewed following labs and imaging studies  CBC: Recent Labs  Lab 06/17/20 0357 06/18/20 0809  WBC 20.4* 13.7*  NEUTROABS 17.8* 10.7*  HGB 11.8* 11.1*  HCT 34.2* 31.5*  MCV 82.4 80.6  PLT 144* 97*   Basic Metabolic Panel: Recent Labs  Lab 06/17/20 0357 06/17/20 1935 06/17/20 1938 06/18/20 0809  NA 133*  --  135 135  K 3.9  --  3.3* 3.3*  CL 102  --  102 106  CO2 18*  --  20* 20*  GLUCOSE 344*  --  203* 97  BUN 38*  --  28* 28*  CREATININE 1.57*  --  1.37* 1.75*  CALCIUM 8.9  --  8.4* 8.1*  MG  --  1.8  --  1.8    PHOS  --   --   --  2.9   GFR: Estimated Creatinine Clearance: 28.8 mL/min (A) (by C-G formula based on SCr of 1.75 mg/dL (H)). Liver Function Tests: Recent Labs  Lab 06/17/20 0357 06/18/20 0809  AST 27 26  ALT 21 15  ALKPHOS 83 84  BILITOT 1.0 0.6  PROT 6.2* 5.3*  ALBUMIN 3.0* 2.2*   No results for input(s): LIPASE, AMYLASE in the last 168 hours. No results for input(s): AMMONIA in the last 168 hours. Coagulation Profile: No results for input(s): INR, PROTIME in the last 168 hours. Cardiac Enzymes: No results for input(s): CKTOTAL, CKMB, CKMBINDEX, TROPONINI in the last 168 hours. BNP (last 3 results) No results for input(s): PROBNP in the last 8760 hours. HbA1C: No results for input(s): HGBA1C in the last 72 hours. CBG: Recent Labs  Lab 06/17/20 1721 06/17/20 2210 06/18/20  0815 06/18/20 1220 06/18/20 1757  GLUCAP 194* 135* 96 110* 123*   Lipid Profile: No results for input(s): CHOL, HDL, LDLCALC, TRIG, CHOLHDL, LDLDIRECT in the last 72 hours. Thyroid Function Tests: Recent Labs    06/18/20 0809  TSH 0.958   Anemia Panel: Recent Labs    06/18/20 0809  FERRITIN 345*   Sepsis Labs: Recent Labs  Lab 06/17/20 0357 06/17/20 1101 06/17/20 1935 06/17/20 2217  PROCALCITON 17.78  --   --   --   LATICACIDVEN 3.1* 2.5* 2.8* 1.6    Recent Results (from the past 240 hour(s))  Blood culture (routine x 2)     Status: Abnormal (Preliminary result)   Collection Time: 06/17/20  3:57 AM   Specimen: BLOOD  Result Value Ref Range Status   Specimen Description   Final    BLOOD LEFT HAND Performed at [facility], [address], [place], [place] [address]    Special Requests   Final    BOTTLES DRAWN AEROBIC AND ANAEROBIC Blood Culture adequate volume Performed at [facility], [address] [place], [place] [address]    Culture  Setup Time   Final    Organism ID to follow GRAM NEGATIVE RODS IN BOTH AEROBIC AND ANAEROBIC BOTTLES CRITICAL  RESULT CALLED TO, READ BACK BY AND VERIFIED WITH: AMY THOMPSON AT 7782 ON 06/17/20 SNG Performed at [facility], [address], [place], [place] [address]    Culture KLEBSIELLA PNEUMONIAE (A)  Final   Report Status PENDING  Incomplete  Blood culture (routine x 2)     Status: Abnormal (Preliminary result)   Collection Time: 06/17/20  3:57 AM   Specimen: BLOOD  Result Value Ref Range Status   Specimen Description   Final    BLOOD RIGHT [facility] Performed at [facility], [address], [place], [place] [address]    Special Requests   Final    BOTTLES DRAWN AEROBIC AND ANAEROBIC Blood Culture adequate volume Performed at [facility], [address], [place], [place] [address]    Culture  Setup Time   Final    GRAM NEGATIVE RODS IN BOTH AEROBIC AND ANAEROBIC BOTTLES CRITICAL VALUE NOTED.  VALUE IS CONSISTENT WITH PREVIOUSLY REPORTED AND CALLED VALUE. Performed at [facility], [address] [place], [place] [address]    Culture (A)  Final    KLEBSIELLA PNEUMONIAE SUSCEPTIBILITIES TO FOLLOW Performed at [facility], [place] [address] [place],  [address]    Report Status PENDING  Incomplete  Blood Culture ID Panel (Reflexed)     Status: Abnormal   Collection Time: 06/17/20  3:57 AM  Result Value Ref Range Status   Enterococcus faecalis NOT DETECTED NOT DETECTED Final   Enterococcus Faecium NOT DETECTED NOT DETECTED Final   Listeria monocytogenes NOT DETECTED NOT DETECTED Final   Staphylococcus species NOT DETECTED NOT DETECTED Final   Staphylococcus aureus (BCID) NOT DETECTED NOT DETECTED Final   Staphylococcus epidermidis NOT DETECTED NOT DETECTED Final   Staphylococcus lugdunensis NOT DETECTED NOT DETECTED Final   Streptococcus species NOT DETECTED NOT DETECTED Final   Streptococcus agalactiae NOT DETECTED NOT DETECTED Final   Streptococcus pneumoniae NOT DETECTED NOT DETECTED Final   Streptococcus pyogenes NOT DETECTED NOT  DETECTED Final   A.calcoaceticus-baumannii NOT DETECTED NOT DETECTED Final   Bacteroides fragilis NOT DETECTED NOT DETECTED Final   Enterobacterales DETECTED (A) NOT DETECTED Final    Comment: Enterobacterales represent a large order of gram negative bacteria,  not a single organism. CRITICAL RESULT CALLED TO, READ BACK BY AND VERIFIED WITH: AMY THOMPSON AT 9323 ON 06/17/20 SNG    Enterobacter cloacae complex NOT DETECTED NOT DETECTED Final   Escherichia coli NOT DETECTED NOT DETECTED Final   Klebsiella aerogenes NOT DETECTED NOT DETECTED Final   Klebsiella oxytoca NOT DETECTED NOT DETECTED Final   Klebsiella pneumoniae DETECTED (A) NOT DETECTED Final    Comment: CRITICAL RESULT CALLED TO, READ BACK BY AND VERIFIED WITH: AMY THOMPSON RN AT 5573 ON 06/17/20 SNG    Proteus species NOT DETECTED NOT DETECTED Final   Salmonella species NOT DETECTED NOT DETECTED Final   Serratia marcescens NOT DETECTED NOT DETECTED Final   Haemophilus influenzae NOT DETECTED NOT DETECTED Final   Neisseria meningitidis NOT DETECTED NOT DETECTED Final   Pseudomonas aeruginosa NOT DETECTED NOT DETECTED Final   Stenotrophomonas maltophilia NOT DETECTED NOT DETECTED Final   Candida albicans NOT DETECTED NOT DETECTED Final   Candida auris NOT DETECTED NOT DETECTED Final   Candida glabrata NOT DETECTED NOT DETECTED Final   Candida krusei NOT DETECTED NOT DETECTED Final   Candida parapsilosis NOT DETECTED NOT DETECTED Final   Candida tropicalis NOT DETECTED NOT DETECTED Final   Cryptococcus neoformans/gattii NOT DETECTED NOT DETECTED Final   CTX-M ESBL NOT DETECTED NOT DETECTED Final   Carbapenem resistance IMP NOT DETECTED NOT DETECTED Final   Carbapenem resistance KPC NOT DETECTED NOT DETECTED Final   Carbapenem resistance NDM NOT DETECTED NOT DETECTED Final   Carbapenem resist OXA 48 LIKE NOT DETECTED NOT DETECTED Final   Carbapenem resistance VIM NOT DETECTED NOT DETECTED Final    Comment: Performed at  [facility], [address] [place], [place] [address]  Urine Culture     Status: Abnormal (Preliminary result)   Collection Time: 06/17/20 11:01 AM   Specimen: Urine, Random  Result Value Ref Range Status   Specimen Description   Final    URINE, RANDOM Performed at [facility], [address], [place], [place] [address]    Special Requests   Final    NONE Performed at [facility], [address] [place], [place] [address]    Culture (A)  Final    >=100,000 COLONIES/mL KLEBSIELLA PNEUMONIAE SUSCEPTIBILITIES TO FOLLOW Performed at [facility], [address] [place], [place] [address]    Report Status PENDING  Incomplete      Radiology Studies: CT Angio Chest PE W and/or Wo Contrast  Result Date: 06/17/2020 CLINICAL DATA:  Positive D-dimer.  COVID. EXAM: CT ANGIOGRAPHY CHEST WITH CONTRAST TECHNIQUE: Multidetector CT imaging of the chest was performed using the standard protocol during bolus administration of intravenous contrast. Multiplanar CT image reconstructions and MIPs were obtained to evaluate the vascular anatomy. CONTRAST:  9mL OMNIPAQUE IOHEXOL 350 MG/ML SOLN COMPARISON:  07/05/2019 FINDINGS: Cardiovascular: No filling defects in the pulmonary arteries to suggest pulmonary emboli. Heart is normal size. Aortic atherosclerosis. No aneurysm. Coronary artery calcifications. Mediastinum/Nodes: No mediastinal, hilar, or axillary adenopathy. Trachea and esophagus are unremarkable. Thyroid unremarkable. Lungs/Pleura: No confluent opacities or effusions. Upper Abdomen: Severe right hydronephrosis, slightly worsened from abdominal CT 07/05/2019. Musculoskeletal: Chest wall soft tissues are unremarkable. No acute bony abnormality. Review of the MIP images confirms the above findings. IMPRESSION: No acute bony abnormality. Postoperative changes in the thoracolumbar spine. No evidence of pulmonary embolus. Coronary artery disease. Severe right  hydronephrosis, worsening since October 2020. Electronically Signed   By: Rolm Baptise M.D.   On: 06/17/2020  07:38   DG Chest Portable 1 View  Result Date: 06/17/2020 CLINICAL DATA:  COVID with shortness of breath EXAM: PORTABLE CHEST 1 VIEW COMPARISON:  07/08/2019 FINDINGS: Normal heart size and mediastinal contours. No acute infiltrate or edema. No effusion or pneumothorax. No acute osseous findings. Artifact from EKG leads. Thoracolumbar posterior fusion hardware. IMPRESSION: Negative for pneumonia. Electronically Signed   By: Monte Fantasia M.D.   On: 06/17/2020 04:42   DG OR UROLOGY CYSTO IMAGE ([facility] ONLY)  Result Date: 06/18/2020 There is no interpretation for this exam.  This order is for images obtained during a surgical procedure.  Please See "Surgeries" Tab for more information regarding the procedure.   CT RENAL STONE STUDY  Result Date: 06/17/2020 CLINICAL DATA:  Hydronephrosis, acute kidney injury EXAM: CT ABDOMEN AND PELVIS WITHOUT CONTRAST TECHNIQUE: Multidetector CT imaging of the abdomen and pelvis was performed following the standard protocol without IV contrast. COMPARISON:  CTA of the chest obtained earlier today; prior CT scan of the abdomen and pelvis 07/05/2019 FINDINGS: Lower chest: No acute abnormality. Small hiatal hernia. Atherosclerotic calcifications visualized along the coronary arteries. The heart is normal in size. Hepatobiliary: Relatively low attenuation of the hepatic parenchyma consistent with steatosis. No discrete hepatic lesion. High attenuation material within the gallbladder may represent sludge and/or small stones. Pancreas: Unremarkable. No pancreatic ductal dilatation or surrounding inflammatory changes. Spleen: Normal in size without focal abnormality. Adrenals/Urinary Tract: Normal adrenal glands. Nonspecific perinephric stranding bilaterally. Moderate right-sided hydronephrosis terminating at the ureteropelvic junction is slightly progressed compared to  prior imaging from October of 2020. Filling defects are present within the dependent portion of the renal collecting system. The ureter itself appears patent to the bladder. The bladder is distended and contains a large amount of air. Additionally, there is some debris layering dependently. Striated nephrogram throughout the right kidney favored to represent delayed nephrogram secondary to UPJ obstruction. Bilateral circumscribed low-attenuation renal lesions demonstrate no significant interval change in most likely represent simple and minimally complex renal cysts. Stomach/Bowel: No acute abnormality. Moderate rectal stool burden suggests constipation. Evidence of prior colonic and enteric surgery. No obstruction. Vascular/Lymphatic: Limited evaluation in the absence of intravenous contrast. Atherosclerotic vascular calcifications throughout the abdominal aorta and branch arteries. No suspicious lymphadenopathy. Reproductive: No acute abnormality. Other: No abdominal wall hernia or abnormality. No abdominopelvic ascites. Musculoskeletal: Advanced thoracolumbosacral posterior interbody fusion with multilevel screw and rod construct. No evidence of acute abnormality. IMPRESSION: 1. 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 2. Debris present dependently within the right renal collecting system and bladder. This may represent thrombus in the setting of hematuria, mucinous debris, or purulent debris if the patient has evidence of an active urinary tract infection. Ultimately, cystoscopy should be considered after resolution of the patient's acute symptoms to exclude the less likely possibility of a bladder wall mass. 3. Bladder distension with air/gas floating in the fundus. This may be secondary to recent instrumentation (catheter insertion), or be reflective of infection with a gas-forming organism. Recommend correlation with urinalysis. 4. Nonspecific perinephric stranding bilaterally  consistent with the history of acute kidney injury. 5. Moderate rectal stool burden consistent with constipation. 6. Small hiatal hernia. 7. Aortic and cardiac atherosclerotic vascular calcifications. Electronically Signed   By: Jacqulynn Cadet M.D.   On: 06/17/2020 11:04     Scheduled Meds: . acidophilus  1 capsule Oral Daily  . vitamin C  500 mg Oral Daily  . brimonidine  1 drop Both Eyes BID  . [  START ON 06/19/2020] Chlorhexidine Gluconate Cloth  6 each Topical Q0600  . [START ON 06/19/2020] enoxaparin (LOVENOX) injection  30 mg Subcutaneous Q24H  . insulin aspart  0-20 Units Subcutaneous TID AC & HS  . insulin detemir  0.1 Units/kg Subcutaneous BID  . mouth rinse  15 mL Mouth Rinse BID  . metoprolol tartrate  50 mg Oral BID  . pantoprazole  40 mg Oral Daily  . rosuvastatin  10 mg Oral Daily  . sodium chloride flush  3 mL Intravenous Q12H  . tamsulosin  0.4 mg Oral Daily  . vitamin B-12  1,000 mcg Oral Daily  . zinc sulfate  220 mg Oral Daily   Continuous Infusions: . sodium chloride    . sodium chloride 100 mL/hr at 06/18/20 1811  . amiodarone 30 mg/hr (06/18/20 1756)  . cefTRIAXone (ROCEPHIN)  IV Stopped (06/17/20 1830)  . remdesivir 100 mg in NS 100 mL 100 mg (06/18/20 1025)     LOS: 1 day    In addition to level 3 billing (high complexity of care), I have provided critical care service of 35 minutes at the bedside due to  1) pt's persistent Afib w RVR, resulting in dropping BP, requiring multiple medication adjustments, from IV dilt f/b gtt, IV metop, to starting amiodarone bolus+gtt.   2) MEWS score of red, with ongoing severe sepsis, requiring evaluation at the bedside for stability of vitals, decision for IVF and abx. 3) Also coordinated urgent care with both urologist and cardiologist.    Enzo Bi, MD [facility] If 7PM-7AM, please contact night-coverage 06/18/2020, 6:44 PM

## 2020-06-18 NOTE — Progress Notes (Signed)
Pt transferred down for stent placement via bed accompanied by 2 nursing staff, pt alert and oriented during transport, on amio gtts, no acute distress noted. Consent signed and placed in the chart.

## 2020-06-18 NOTE — Consult Note (Signed)
  Amiodarone Drug - Drug Interaction Consult Note  Recommendations: --9/17 ECG sinus tachycardia HR 103; Max HR 196; pulse remains elevated in 100s --SBP 120-170s  --Continue to monitor HR and BP given amiodarone interactions with Lopressor, diltiazem, and amlodipine.   No adjustment required for Amiodarone and rosuvastatin.  Amiodarone is metabolized by the cytochrome P450 system and therefore has the potential to cause many drug interactions. Amiodarone has an average plasma half-life of 50 days (range 20 to 100 days).   There is potential for drug interactions to occur several weeks or months after stopping treatment and the onset of drug interactions may be slow after initiating amiodarone.   [x]  Statins: Increased risk of myopathy. Simvastatin- restrict dose to 20mg  daily. Other statins: counsel patients to report any muscle pain or weakness immediately.  []  Anticoagulants: Amiodarone can increase anticoagulant effect. Consider warfarin dose reduction. Patients should be monitored closely and the dose of anticoagulant altered accordingly, remembering that amiodarone levels take several weeks to stabilize.  []  Antiepileptics: Amiodarone can increase plasma concentration of phenytoin, the dose should be reduced. Note that small changes in phenytoin dose can result in large changes in levels. Monitor patient and counsel on signs of toxicity.  [x]  Beta blockers: increased risk of bradycardia, AV block and myocardial depression. Sotalol - avoid concomitant use.  [x]   Calcium channel blockers (diltiazem and verapamil): increased risk of bradycardia, AV block and myocardial depression.  []   Cyclosporine: Amiodarone increases levels of cyclosporine. Reduced dose of cyclosporine is recommended.  []  Digoxin dose should be halved when amiodarone is started.  []  Diuretics: increased risk of cardiotoxicity if hypokalemia occurs.  []  Oral hypoglycemic agents (glyburide, glipizide, glimepiride):  increased risk of hypoglycemia. Patient's glucose levels should be monitored closely when initiating amiodarone therapy.   [x]  Drugs that prolong the QT interval:  Torsades de pointes risk may be increased with concurrent use - avoid if possible.  Monitor QTc, also keep magnesium/potassium WNL if concurrent therapy can't be avoided. Marland Kitchen Antibiotics: e.g. fluoroquinolones, erythromycin. . Antiarrhythmics: e.g. quinidine, procainamide, disopyramide, sotalol. . Antipsychotics: e.g. phenothiazines, haloperidol.  . Lithium, tricyclic antidepressants, and methadone. Thank You,  Sherilyn Banker, PharmD Pharmacy Resident  06/18/2020 11:12 AM

## 2020-06-19 ENCOUNTER — Inpatient Hospital Stay (HOSPITAL_COMMUNITY)
Admit: 2020-06-19 | Discharge: 2020-06-19 | Disposition: A | Discharge status: Home / Self Care | Payer: Medicare Other | Attending: Urology | Admitting: Urology

## 2020-06-19 ENCOUNTER — Encounter: Payer: Self-pay | Admitting: Urology

## 2020-06-19 DIAGNOSIS — L8990 Pressure ulcer of unspecified site, unspecified stage: Secondary | ICD-10-CM | POA: Diagnosis present

## 2020-06-19 DIAGNOSIS — I4891 Unspecified atrial fibrillation: Secondary | ICD-10-CM | POA: Diagnosis not present

## 2020-06-19 DIAGNOSIS — A419 Sepsis, unspecified organism: Secondary | ICD-10-CM

## 2020-06-19 DIAGNOSIS — N1330 Unspecified hydronephrosis: Secondary | ICD-10-CM

## 2020-06-19 DIAGNOSIS — D6869 Other thrombophilia: Secondary | ICD-10-CM | POA: Diagnosis not present

## 2020-06-19 DIAGNOSIS — Z7901 Long term (current) use of anticoagulants: Secondary | ICD-10-CM

## 2020-06-19 DIAGNOSIS — D72829 Elevated white blood cell count, unspecified: Secondary | ICD-10-CM

## 2020-06-19 LAB — COMPREHENSIVE METABOLIC PANEL
ALT: 15 U/L (ref 0–44)
AST: 30 U/L (ref 15–41)
Albumin: 2.3 g/dL — ABNORMAL LOW (ref 3.5–5.0)
Alkaline Phosphatase: 110 U/L (ref 38–126)
Anion gap: 12 (ref 5–15)
BUN: 30 mg/dL — ABNORMAL HIGH (ref 8–23)
CO2: 21 mmol/L — ABNORMAL LOW (ref 22–32)
Calcium: 7.8 mg/dL — ABNORMAL LOW (ref 8.9–10.3)
Chloride: 101 mmol/L (ref 98–111)
Creatinine, Ser: 2.28 mg/dL — ABNORMAL HIGH (ref 0.44–1.00)
GFR calc Af Amer: 23 mL/min — ABNORMAL LOW (ref >60)
GFR calc non Af Amer: 20 mL/min — ABNORMAL LOW (ref >60)
Glucose, Bld: 140 mg/dL — ABNORMAL HIGH (ref 70–99)
Potassium: 3.6 mmol/L (ref 3.5–5.1)
Sodium: 134 mmol/L — ABNORMAL LOW (ref 135–145)
Total Bilirubin: 0.5 mg/dL (ref 0.3–1.2)
Total Protein: 5.9 g/dL — ABNORMAL LOW (ref 6.5–8.1)

## 2020-06-19 LAB — CBC WITH DIFFERENTIAL/PLATELET
Abs Immature Granulocytes: 0.17 10*3/uL — ABNORMAL HIGH (ref 0.00–0.07)
Basophils Absolute: 0.1 10*3/uL (ref 0.0–0.1)
Basophils Relative: 1 %
Eosinophils Absolute: 0 10*3/uL (ref 0.0–0.5)
Eosinophils Relative: 0 %
HCT: 31.2 % — ABNORMAL LOW (ref 36.0–46.0)
Hemoglobin: 10.5 g/dL — ABNORMAL LOW (ref 12.0–15.0)
Immature Granulocytes: 1 %
Lymphocytes Relative: 4 %
Lymphs Abs: 0.6 10*3/uL — ABNORMAL LOW (ref 0.7–4.0)
MCH: 28.2 pg (ref 26.0–34.0)
MCHC: 33.7 g/dL (ref 30.0–36.0)
MCV: 83.6 fL (ref 80.0–100.0)
Monocytes Absolute: 0.9 10*3/uL (ref 0.1–1.0)
Monocytes Relative: 7 %
Neutro Abs: 11.8 10*3/uL — ABNORMAL HIGH (ref 1.7–7.7)
Neutrophils Relative %: 87 %
Platelets: 96 10*3/uL — ABNORMAL LOW (ref 150–400)
RBC: 3.73 MIL/uL — ABNORMAL LOW (ref 3.87–5.11)
RDW: 14.6 % (ref 11.5–15.5)
WBC: 13.5 10*3/uL — ABNORMAL HIGH (ref 4.0–10.5)
nRBC: 0 % (ref 0.0–0.2)

## 2020-06-19 LAB — CULTURE, BLOOD (ROUTINE X 2)
Special Requests: ADEQUATE
Special Requests: ADEQUATE

## 2020-06-19 LAB — ECHOCARDIOGRAM COMPLETE
AR max vel: 2.66 cm2
AV Peak grad: 2.8 mmHg
Ao pk vel: 0.84 m/s
Area-P 1/2: 3.6 cm2
Height: 66 in
S' Lateral: 3.24 cm
Weight: 2836 oz

## 2020-06-19 LAB — GLUCOSE, CAPILLARY
Glucose-Capillary: 200 mg/dL — ABNORMAL HIGH (ref 70–99)
Glucose-Capillary: 210 mg/dL — ABNORMAL HIGH (ref 70–99)
Glucose-Capillary: 161 mg/dL — ABNORMAL HIGH (ref 70–99)
Glucose-Capillary: 69 mg/dL — ABNORMAL LOW (ref 70–99)
Glucose-Capillary: 129 mg/dL — ABNORMAL HIGH (ref 70–99)

## 2020-06-19 LAB — FIBRIN DERIVATIVES D-DIMER (ARMC ONLY): Fibrin derivatives D-dimer (ARMC): 7259.72 ng/mL (FEU) — ABNORMAL HIGH (ref 0.00–499.00)

## 2020-06-19 LAB — FERRITIN: Ferritin: 472 ng/mL — ABNORMAL HIGH (ref 11–307)

## 2020-06-19 LAB — URINE CULTURE: Culture: >=100000 — AB

## 2020-06-19 LAB — HEPARIN LEVEL (UNFRACTIONATED)
Heparin Unfractionated: 0.62 IU/mL (ref 0.30–0.70)
Heparin Unfractionated: 0.65 IU/mL (ref 0.30–0.70)

## 2020-06-19 LAB — C-REACTIVE PROTEIN: CRP: 42.3 mg/dL — ABNORMAL HIGH (ref <1.0)

## 2020-06-19 MED ORDER — APIXABAN 5 MG PO TABS
5.0000 mg | ORAL_TABLET | Freq: Two times a day (BID) | ORAL | Status: DC
  Administered 2020-06-19 – 2020-06-21 (×5): 5 mg via ORAL
  Filled 2020-06-19 (×5): qty 1

## 2020-06-19 MED ORDER — DILTIAZEM HCL 30 MG PO TABS
30.0000 mg | ORAL_TABLET | Freq: Four times a day (QID) | ORAL | Status: DC
  Administered 2020-06-19 – 2020-06-22 (×13): 30 mg via ORAL
  Filled 2020-06-19 (×12): qty 1

## 2020-06-19 MED ORDER — METOPROLOL TARTRATE 25 MG PO TABS
25.0000 mg | ORAL_TABLET | Freq: Four times a day (QID) | ORAL | Status: DC
  Administered 2020-06-19 – 2020-06-20 (×3): 25 mg via ORAL
  Filled 2020-06-19 (×3): qty 1

## 2020-06-19 MED ORDER — SODIUM CHLORIDE 0.9 % IV SOLN: INTRAVENOUS | Status: DC

## 2020-06-19 MED ORDER — PIPERACILLIN-TAZOBACTAM 3.375 G IVPB
3.3750 g | Freq: Three times a day (TID) | INTRAVENOUS | Status: DC
  Administered 2020-06-19 – 2020-06-20 (×2): 3.375 g via INTRAVENOUS
  Filled 2020-06-19 (×3): qty 50

## 2020-06-19 MED ORDER — DEXTROSE 50 % IV SOLN: 25.0000 mL | Freq: Once | INTRAVENOUS | Status: AC

## 2020-06-19 MED ORDER — AMIODARONE LOAD VIA INFUSION
150.0000 mg | Freq: Once | INTRAVENOUS | Status: AC
  Administered 2020-06-19: 150 mg via INTRAVENOUS
  Filled 2020-06-19: qty 83.34

## 2020-06-19 MED ORDER — DEXTROSE 50 % IV SOLN
INTRAVENOUS | Status: AC
  Administered 2020-06-19: 25 mL via INTRAVENOUS
  Filled 2020-06-19: qty 50

## 2020-06-19 NOTE — Progress Notes (Signed)
PROGRESS NOTE    Stephanie [name]  IEP:[id number] DOB: [birth year] DOA: 06/17/2020 PCP: Rusty Aus, MD    Assessment & Plan:   Principal Problem:   Sepsis [facility]) Active Problems:   Benign essential hypertension   Chronic cystitis   Rheumatoid arthritis involving multiple sites with positive rheumatoid factor ([place])   Diabetes mellitus ([place])   Rheumatoid arthritis ([place])   COVID-19 virus infection   AKI (acute kidney injury) ([place])   Atrial fibrillation with rapid ventricular response ([place])   Essential hypertension   Pure hypercholesterolemia   Acute pyelonephritis   Hydronephrosis, right   Lactic acid acidosis   Hyperglycemia   Hx of essential hypertension   Immunosuppression due to drug therapy   Pressure injury of skin   Acquired thrombophilia ([place])   On continuous oral anticoagulation    Stephanie [name] is a 79 y.o. female with medical history significant for COVID-19 viral infection (positive PCR test June 09, 2020), history of diabetes mellitus, history of rheumatoid arthritis on chronic immunosuppressive therapy, hypertension and a remote history of colon cancer who was brought into the ER by EMS for evaluation of fever.  Patient had a T-max of 104F at home.  She complains of generalized weakness and worsening shortness of breath but denies having any nausea, no vomiting, no chest pain or abdominal pain.  Patient does self caths at home. Patient is fully vaccinated She was started on prednisone by her primary care provider for presumed COVID-19 bronchitis   # Sepsis 2/2 right pyelonephritis  --As evidenced by fever with a T-max of 103F, tachycardia, tachypnea, leukocytosis of 20,000 with a left shift and lactic acid of 3.1.  UA positive for infection.  Pt started on ceftriaxone. --CT renal showed "Debris present dependently within the right renal collecting system and bladder"   --Pt received 2L LR f/b MIVF --Urine grew pan-sensitive Klebsiella. PLAN:  --broaden to  zosyn today due to pt still spiking fevers --resume MIVF@100   # Severe right hydronephrosis --CT renal showed "Progressive right UPJ obstruction compared to prior October of 2020." --right stent placement by urology 9/18 (found frank pus in the kidney) PLAN: --continue Foley  --MIVF to keep up with post-obstructive diuresis --strict I/O  # New onset Afib w RVR, persistent --developed while in the ED.  HR 120's-140's despite multiple doses of IV dilt, IV metop, dilt gtt@15   --started heparin gtt 9/18 --It appeared that her right hand IV was infiltrated and all the IV dilt, IV metop and IV amiodarone may have not gotten into her system. --cardiology consulted PLAN: --continue amiodarone gtt --start oral dilt 30 mg q6h, per cards --lopressor to 25 mg q6h, per cards --transition to Eliquis today --treat sepsis  # COVID-19 infection --Pt is fully vaccinated.  Patient's COVID-19 PCR test was positive on 06/09/20.  No overt respiratory symptoms.  CT chest clear.  No real O2 requirement on presentation. --Remdesivir started on admission PLAN: --continue Remdesivir --monitor O2 sats  AKI --on admission creatinine is 1.5.  Baseline Cr 0.8.  AKI due to urinary obstruction and infection. --Resume MIVF@100    Lactic acidosis 2/2 sepsis --resolved with IVF.  Hx Diabetes mellitus  hyperglycemia related to systemic steroid administration Patient was started on prednisone as an outpatient by her primary care provider for presumed COVID-19 bronchitis Chest x-ray is negative for pneumonia and patient is not hypoxic Prednisone d/c'ed. PLAN: Optimize glycemic control with sliding scale coverage with insulin  Hx of HTN --BP drops sometimes from a lot of  rate control agents --Hold home amlodipine  History of rheumatoid arthritis --continue to hold Arava due to sepsis    DVT prophylaxis: Lovenox SQ Code Status: DNR  Family Communication:  Status is: inpatient Dispo:   The patient  is from: home Anticipated d/c is to: home Anticipated d/c date is: >3 days Patient currently is not medically stable to d/c due to: Severe sepsis, right hydro and pyelo requiring IV abx, new onset Afib w RVR not yet rate controlled.    Subjective and Interval History:  Pt has been in afib RVR for the past day.  It appeared that her right hand IV was infiltrated and all the IV dilt, IV metop and IV amiodarone may have not gotten into her system.  Pt also still spiking fevers, so abx broadened to zosyn.   Objective: Vitals:   06/19/20 1500 06/19/20 1506 06/19/20 1600 06/19/20 1734  BP: (!) 135/93 (!) 135/93 137/83 (!) 145/91  Pulse: 80 (!) 139 (!) 125   Resp: (!) 22  (!) 25   Temp:   (!) 100.4 F (38 C)   TempSrc:   Oral   SpO2: 95%  96%   Weight:      Height:        Intake/Output Summary (Last 24 hours) at 06/19/2020 1910 Last data filed at 06/19/2020 1803 Gross per 24 hour  Intake 510.67 ml  Output 3776 ml  Net -3265.33 ml   Filed Weights   06/17/20 0352 06/18/20 0625 06/19/20 0625  Weight: 75.3 kg 80.2 kg 80.4 kg    Examination:   Constitutional: NAD, lethargic but arousable HEENT: conjunctivae and lids normal, EOMI CV: RRR no M,R,G. Distal pulses +2.  No cyanosis.   RESP: CTA B/L, normal respiratory effort, on 2L GI: +BS, NTND Extremities: No effusions, edema in BLE.  Right hand severely edematous with an IV on the hand. SKIN: warm, dry and intact Neuro: II - XII grossly intact.  Sensation intact   Data Reviewed: I have personally reviewed following labs and imaging studies  CBC: Recent Labs  Lab 06/17/20 0357 06/18/20 0809 06/19/20 0537  WBC 20.4* 13.7* 13.5*  NEUTROABS 17.8* 10.7* 11.8*  HGB 11.8* 11.1* 10.5*  HCT 34.2* 31.5* 31.2*  MCV 82.4 80.6 83.6  PLT 144* 97* 96*   Basic Metabolic Panel: Recent Labs  Lab 06/17/20 0357 06/17/20 1935 06/17/20 1938 06/18/20 0809 06/19/20 0537  NA 133*  --  135 135 134*  K 3.9  --  3.3* 3.3* 3.6  CL 102  --   102 106 101  CO2 18*  --  20* 20* 21*  GLUCOSE 344*  --  203* 97 140*  BUN 38*  --  28* 28* 30*  CREATININE 1.57*  --  1.37* 1.75* 2.28*  CALCIUM 8.9  --  8.4* 8.1* 7.8*  MG  --  1.8  --  1.8  --   PHOS  --   --   --  2.9  --    GFR: Estimated Creatinine Clearance: 22.1 mL/min (A) (by C-G formula based on SCr of 2.28 mg/dL (H)). Liver Function Tests: Recent Labs  Lab 06/17/20 0357 06/18/20 0809 06/19/20 0537  AST 27 26 30   ALT 21 15 15   ALKPHOS 83 84 110  BILITOT 1.0 0.6 0.5  PROT 6.2* 5.3* 5.9*  ALBUMIN 3.0* 2.2* 2.3*   No results for input(s): LIPASE, AMYLASE in the last 168 hours. No results for input(s): AMMONIA in the last 168 hours. Coagulation Profile: Recent Labs  Lab 06/18/20 2048  INR 1.2   Cardiac Enzymes: No results for input(s): CKTOTAL, CKMB, CKMBINDEX, TROPONINI in the last 168 hours. BNP (last 3 results) No results for input(s): PROBNP in the last 8760 hours. HbA1C: No results for input(s): HGBA1C in the last 72 hours. CBG: Recent Labs  Lab 06/18/20 1757 06/18/20 2120 06/19/20 0817 06/19/20 1208 06/19/20 1647  GLUCAP 123* 117* 200* 210* 161*   Lipid Profile: No results for input(s): CHOL, HDL, LDLCALC, TRIG, CHOLHDL, LDLDIRECT in the last 72 hours. Thyroid Function Tests: Recent Labs    06/18/20 0809  TSH 0.958   Anemia Panel: Recent Labs    06/18/20 0809 06/19/20 0537  FERRITIN 345* 472*   Sepsis Labs: Recent Labs  Lab 06/17/20 0357 06/17/20 1101 06/17/20 1935 06/17/20 2217  PROCALCITON 17.78  --   --   --   LATICACIDVEN 3.1* 2.5* 2.8* 1.6    Recent Results (from the past 240 hour(s))  Blood culture (routine x 2)     Status: Abnormal   Collection Time: 06/17/20  3:57 AM   Specimen: BLOOD  Result Value Ref Range Status   Specimen Description   Final    BLOOD LEFT HAND Performed at [facility], [address], [place], [place] [address]    Special Requests   Final    BOTTLES DRAWN AEROBIC AND ANAEROBIC  Blood Culture adequate volume Performed at [facility], [address] [place], [place] [address]    Culture  Setup Time   Final    Organism ID to follow GRAM NEGATIVE RODS IN BOTH AEROBIC AND ANAEROBIC BOTTLES CRITICAL RESULT CALLED TO, READ BACK BY AND VERIFIED WITH: AMY THOMPSON AT 1534 ON 06/17/20 SNG Performed at [facility], [address] [place], [place] [address]    Culture KLEBSIELLA PNEUMONIAE (A)  Final   Report Status 06/19/2020 FINAL  Final   Organism ID, Bacteria KLEBSIELLA PNEUMONIAE  Final      Susceptibility   Klebsiella pneumoniae - MIC*    AMPICILLIN RESISTANT Resistant     CEFAZOLIN <=4 SENSITIVE Sensitive     CEFEPIME <=0.12 SENSITIVE Sensitive     CEFTAZIDIME <=1 SENSITIVE Sensitive     CEFTRIAXONE <=0.25 SENSITIVE Sensitive     CIPROFLOXACIN <=0.25 SENSITIVE Sensitive     GENTAMICIN <=1 SENSITIVE Sensitive     IMIPENEM <=0.25 SENSITIVE Sensitive     TRIMETH/SULFA <=20 SENSITIVE Sensitive     AMPICILLIN/SULBACTAM 4 SENSITIVE Sensitive     PIP/TAZO <=4 SENSITIVE Sensitive     * KLEBSIELLA PNEUMONIAE  Blood culture (routine x 2)     Status: Abnormal   Collection Time: 06/17/20  3:57 AM   Specimen: BLOOD  Result Value Ref Range Status   Specimen Description   Final    BLOOD RIGHT [facility] Performed at [facility], [address] [place], [place] [address]    Special Requests   Final    BOTTLES DRAWN AEROBIC AND ANAEROBIC Blood Culture adequate volume Performed at [facility], [address] [place], [place] [address]    Culture  Setup Time   Final    GRAM NEGATIVE RODS IN BOTH AEROBIC AND ANAEROBIC BOTTLES CRITICAL VALUE NOTED.  VALUE IS CONSISTENT WITH PREVIOUSLY REPORTED AND CALLED VALUE. Performed at [facility], [address] [place], [place] [address]    Culture (A)  Final    KLEBSIELLA PNEUMONIAE SUSCEPTIBILITIES PERFORMED ON PREVIOUS CULTURE WITHIN THE LAST 5 DAYS. Performed at [facility],  [address], [place], [place] [address]    Report Status 06/19/2020 FINAL  Final  Blood Culture ID Panel (Reflexed)     Status: Abnormal   Collection Time: 06/17/20  3:57 AM  Result Value Ref Range Status   Enterococcus faecalis NOT DETECTED NOT DETECTED Final   Enterococcus Faecium NOT DETECTED NOT DETECTED Final   Listeria monocytogenes NOT DETECTED NOT DETECTED Final   Staphylococcus species NOT DETECTED NOT DETECTED Final   Staphylococcus aureus (BCID) NOT DETECTED NOT DETECTED Final   Staphylococcus epidermidis NOT DETECTED NOT DETECTED Final   Staphylococcus lugdunensis NOT DETECTED NOT DETECTED Final   Streptococcus species NOT DETECTED NOT DETECTED Final   Streptococcus agalactiae NOT DETECTED NOT DETECTED Final   Streptococcus pneumoniae NOT DETECTED NOT DETECTED Final   Streptococcus pyogenes NOT DETECTED NOT DETECTED Final   A.calcoaceticus-baumannii NOT DETECTED NOT DETECTED Final   Bacteroides fragilis NOT DETECTED NOT DETECTED Final   Enterobacterales DETECTED (A) NOT DETECTED Final    Comment: Enterobacterales represent a large order of gram negative bacteria, not a single organism. CRITICAL RESULT CALLED TO, READ BACK BY AND VERIFIED WITH: AMY THOMPSON AT 9470 ON 06/17/20 SNG    Enterobacter cloacae complex NOT DETECTED NOT DETECTED Final   Escherichia coli NOT DETECTED NOT DETECTED Final   Klebsiella aerogenes NOT DETECTED NOT DETECTED Final   Klebsiella oxytoca NOT DETECTED NOT DETECTED Final   Klebsiella pneumoniae DETECTED (A) NOT DETECTED Final    Comment: CRITICAL RESULT CALLED TO, READ BACK BY AND VERIFIED WITH: AMY THOMPSON RN AT 9628 ON 06/17/20 SNG    Proteus species NOT DETECTED NOT DETECTED Final   Salmonella species NOT DETECTED NOT DETECTED Final   Serratia marcescens NOT DETECTED NOT DETECTED Final   Haemophilus influenzae NOT DETECTED NOT DETECTED Final   Neisseria meningitidis NOT DETECTED NOT DETECTED Final   Pseudomonas aeruginosa NOT  DETECTED NOT DETECTED Final   Stenotrophomonas maltophilia NOT DETECTED NOT DETECTED Final   Candida albicans NOT DETECTED NOT DETECTED Final   Candida auris NOT DETECTED NOT DETECTED Final   Candida glabrata NOT DETECTED NOT DETECTED Final   Candida krusei NOT DETECTED NOT DETECTED Final   Candida parapsilosis NOT DETECTED NOT DETECTED Final   Candida tropicalis NOT DETECTED NOT DETECTED Final   Cryptococcus neoformans/gattii NOT DETECTED NOT DETECTED Final   CTX-M ESBL NOT DETECTED NOT DETECTED Final   Carbapenem resistance IMP NOT DETECTED NOT DETECTED Final   Carbapenem resistance KPC NOT DETECTED NOT DETECTED Final   Carbapenem resistance NDM NOT DETECTED NOT DETECTED Final   Carbapenem resist OXA 48 LIKE NOT DETECTED NOT DETECTED Final   Carbapenem resistance VIM NOT DETECTED NOT DETECTED Final    Comment: Performed at [facility], [address] [place], [place] [address]  Urine Culture     Status: Abnormal   Collection Time: 06/17/20 11:01 AM   Specimen: Urine, Random  Result Value Ref Range Status   Specimen Description   Final    URINE, RANDOM Performed at [facility], [address], [place], [place] [address]    Special Requests   Final    NONE Performed at [facility], [address] [place], [place] [address]    Culture >=100,000 COLONIES/mL KLEBSIELLA PNEUMONIAE (A)  Final   Report Status 06/19/2020 FINAL  Final   Organism ID, Bacteria KLEBSIELLA PNEUMONIAE (A)  Final      Susceptibility   Klebsiella pneumoniae - MIC*    AMPICILLIN RESISTANT Resistant  CEFAZOLIN <=4 SENSITIVE Sensitive     CEFTRIAXONE <=0.25 SENSITIVE Sensitive     CIPROFLOXACIN <=0.25 SENSITIVE Sensitive     GENTAMICIN <=1 SENSITIVE Sensitive     IMIPENEM <=0.25 SENSITIVE Sensitive     NITROFURANTOIN 32 SENSITIVE Sensitive     TRIMETH/SULFA <=20 SENSITIVE Sensitive     AMPICILLIN/SULBACTAM 4 SENSITIVE Sensitive     PIP/TAZO <=4 SENSITIVE Sensitive     *  >=100,000 COLONIES/mL KLEBSIELLA PNEUMONIAE      Radiology Studies: DG OR UROLOGY CYSTO IMAGE ([facility] ONLY)  Result Date: 06/18/2020 There is no interpretation for this exam.  This order is for images obtained during a surgical procedure.  Please See "Surgeries" Tab for more information regarding the procedure.   ECHOCARDIOGRAM COMPLETE  Result Date: 06/19/2020    ECHOCARDIOGRAM REPORT   Patient Name:   Stephanie [name] Date of Exam: 06/19/2020 Medical Rec #:  [id number]     Height:       66.0 in Accession #:    [id number]    Weight:       177.2 lb Date of Birth:  1941-06-04      BSA:          1.900 m Patient Age:    79 years      BP:           125/80 mmHg Patient Gender: F             HR:           153 bpm. Exam Location:  [facility] Procedure: 2D Echo, Cardiac Doppler and Color Doppler Indications:     Atrial Fibrillation 427.31 / I48.91  History:         Patient has no prior history of Echocardiogram examinations.                  Risk Factors:Hypertension and Diabetes.  Sonographer:     Alyse Low Roar Referring Phys:  [place] Diagnosing Phys: Kate Sable MD IMPRESSIONS  1. Left ventricular ejection fraction, by estimation, is 50 to 55%. The left ventricle has low normal function. The left ventricle has no regional wall motion abnormalities. There is mild left ventricular hypertrophy. Indeterminate diastolic filling due  to E-A fusion.  2. Right ventricular systolic function is normal. The right ventricular size is normal.  3. The mitral valve is normal in structure. No evidence of mitral valve regurgitation. No evidence of mitral stenosis.  4. The aortic valve is normal in structure. Aortic valve regurgitation is not visualized. No aortic stenosis is present. FINDINGS  Left Ventricle: Left ventricular ejection fraction, by estimation, is 50 to 55%. The left ventricle has low normal function. The left ventricle has no regional wall motion abnormalities. The left ventricular internal cavity size was  normal in size. There is mild left ventricular hypertrophy. Indeterminate diastolic filling due to E-A fusion. Right Ventricle: The right ventricular size is normal. No increase in right ventricular wall thickness. Right ventricular systolic function is normal. Left Atrium: Left atrial size was normal in size. Right Atrium: Right atrial size was normal in size. Pericardium: There is no evidence of pericardial effusion. Mitral Valve: The mitral valve is normal in structure. No evidence of mitral valve regurgitation. No evidence of mitral valve stenosis. Tricuspid Valve: The tricuspid valve is grossly normal. Tricuspid valve regurgitation is not demonstrated. No evidence of tricuspid stenosis. Aortic Valve: The aortic valve is normal in structure. Aortic valve regurgitation is not visualized. No aortic stenosis is present.  Aortic valve peak gradient measures 2.8 mmHg. Pulmonic Valve: The pulmonic valve was not well visualized. Pulmonic valve regurgitation is not visualized. No evidence of pulmonic stenosis. Aorta: The aortic root is normal in size and structure. Venous: The inferior vena cava was not well visualized. IAS/Shunts: No atrial level shunt detected by color flow Doppler.  LEFT VENTRICLE PLAX 2D LVIDd:         4.34 cm  Diastology LVIDs:         3.24 cm  LV e' medial:    8.05 cm/s LV PW:         1.11 cm  LV E/e' medial:  12.1 LV IVS:        1.38 cm  LV e' lateral:   10.10 cm/s LVOT diam:     1.80 cm  LV E/e' lateral: 9.6 LVOT Area:     2.54 cm  RIGHT VENTRICLE RV Mid diam:    2.70 cm RV S prime:     19.00 cm/s TAPSE (M-mode): 1.6 cm LEFT ATRIUM           Index       RIGHT ATRIUM           Index LA diam:      3.40 cm 1.79 cm/m  RA Area:     12.60 cm LA Vol (A4C): 34.8 ml 18.32 ml/m RA Volume:   27.80 ml  14.63 ml/m  AORTIC VALVE               PULMONIC VALVE AV Area (Vmax): 2.66 cm   PV Vmax:        0.96 m/s AV Vmax:        84.20 cm/s PV Peak grad:   3.7 mmHg AV Peak Grad:   2.8 mmHg   RVOT Peak grad: 4  mmHg LVOT Vmax:      88.10 cm/s  AORTA Ao Root diam: 3.30 cm MITRAL VALVE               TRICUSPID VALVE MV Area (PHT): 3.60 cm    TR Peak grad:   17.0 mmHg MV Decel Time: 211 msec    TR Vmax:        206.00 cm/s MV E velocity: 97.30 cm/s                            SHUNTS                            Systemic Diam: 1.80 cm Kate Sable MD Electronically signed by Kate Sable MD Signature Date/Time: 06/19/2020/4:20:09 PM    Final      Scheduled Meds: . acidophilus  1 capsule Oral Daily  . apixaban  5 mg Oral BID  . vitamin C  500 mg Oral Daily  . brimonidine  1 drop Both Eyes BID  . Chlorhexidine Gluconate Cloth  6 each Topical Q0600  . diltiazem  30 mg Oral Q6H  . insulin aspart  0-20 Units Subcutaneous TID AC & HS  . insulin detemir  0.1 Units/kg Subcutaneous BID  . mouth rinse  15 mL Mouth Rinse BID  . metoprolol tartrate  25 mg Oral Q6H  . pantoprazole  40 mg Oral Daily  . rosuvastatin  10 mg Oral Daily  . sodium chloride flush  10-40 mL Intracatheter Q12H  . sodium chloride flush  3 mL Intravenous Q12H  . tamsulosin  0.4 mg Oral Daily  . vitamin B-12  1,000 mcg Oral Daily  . zinc sulfate  220 mg Oral Daily   Continuous Infusions: . sodium chloride    . sodium chloride 100 mL/hr at 06/19/20 1615  . amiodarone 30 mg/hr (06/19/20 1509)  . cefTRIAXone (ROCEPHIN)  IV 2 g (06/19/20 1738)  . remdesivir 100 mg in NS 100 mL 100 mg (06/19/20 0857)     LOS: 2 days     Enzo Bi, MD [facility] If 7PM-7AM, please contact night-coverage 06/19/2020, 7:10 PM

## 2020-06-19 NOTE — Progress Notes (Signed)
Heart rate now back up and ustaining bt 130's-160.  NP made aware.  BP holding at 127/85.  Pt has persistent cough.  Robitussin given .  Temp 99.5

## 2020-06-19 NOTE — Progress Notes (Signed)
EKG done and amio bolus infusing.  BP 124/82, HR 141, O2 sat 96%.  Temp 99.6.  Pt has dry cough but has decreased some with robitussin.  Pt resting quietly with eyes closed.

## 2020-06-19 NOTE — Progress Notes (Signed)
1 Day Post-Op  Subjective: Queen is improving s/p right stent insertion and foley placement and reports no flank pain.  Her fever curve and leukocytosis are improving but her Cr has continued to rise.   Her UOP has been good and the urine is clear.  ROS:  Review of Systems  Unable to perform ROS: Medical condition    Anti-infectives: Anti-infectives (From admission, onward)   Start     Dose/Rate Route Frequency Ordered Stop   06/19/20 2000  piperacillin-tazobactam (ZOSYN) IVPB 3.375 g        3.375 g 12.5 mL/hr over 240 Minutes Intravenous Every 8 hours 06/19/20 1915     06/18/20 1800  cefTRIAXone (ROCEPHIN) 2 g in sodium chloride 0.9 % 100 mL IVPB  Status:  Discontinued        2 g 200 mL/hr over 30 Minutes Intravenous Every 24 hours 06/17/20 1545 06/17/20 1545   06/18/20 1000  remdesivir 100 mg in sodium chloride 0.9 % 100 mL IVPB       "Followed by" Linked Group Details   100 mg 200 mL/hr over 30 Minutes Intravenous Daily 06/17/20 0809 06/22/20 0959   06/18/20 0800  azithromycin (ZITHROMAX) 500 mg in sodium chloride 0.9 % 250 mL IVPB  Status:  Discontinued        500 mg 250 mL/hr over 60 Minutes Intravenous Every 24 hours 06/17/20 0805 06/17/20 0914   06/17/20 1800  cefTRIAXone (ROCEPHIN) 2 g in sodium chloride 0.9 % 100 mL IVPB  Status:  Discontinued        2 g 200 mL/hr over 30 Minutes Intravenous Every 24 hours 06/17/20 1545 06/19/20 1915   06/17/20 1500  cefTRIAXone (ROCEPHIN) 2 g in sodium chloride 0.9 % 100 mL IVPB  Status:  Discontinued        2 g 200 mL/hr over 30 Minutes Intravenous Every 24 hours 06/17/20 0805 06/17/20 1545   06/17/20 1000  remdesivir 200 mg in sodium chloride 0.9% 250 mL IVPB       "Followed by" Linked Group Details   200 mg 580 mL/hr over 30 Minutes Intravenous Once 06/17/20 0809 06/17/20 1216   06/17/20 0530  ceFEPIme (MAXIPIME) 1 g in sodium chloride 0.9 % 100 mL IVPB        1 g 200 mL/hr over 30 Minutes Intravenous  Once 06/17/20 0515 06/17/20  0810   06/17/20 0530  vancomycin (VANCOCIN) IVPB 1000 mg/200 mL premix        1,000 mg 200 mL/hr over 60 Minutes Intravenous  Once 06/17/20 0515 06/17/20 0929   06/17/20 0530  azithromycin (ZITHROMAX) tablet 500 mg        500 mg Oral  Once 06/17/20 0515 06/17/20 0723      Current Facility-Administered Medications  Medication Dose Route Frequency Provider Last Rate Last Admin   0.9 %  sodium chloride infusion  250 mL Intravenous PRN Irine Seal, MD   New Bag at 06/18/20 1624   0.9 %  sodium chloride infusion   Intravenous Continuous Enzo Bi, MD 100 mL/hr at 06/19/20 1615 New Bag at 06/19/20 1615   acetaminophen (TYLENOL) tablet 650 mg  650 mg Oral Q6H PRN Irine Seal, MD   650 mg at 06/19/20 1733   acidophilus (RISAQUAD) capsule 1 capsule  1 capsule Oral Daily Irine Seal, MD   1 capsule at 06/19/20 0845   amiodarone (NEXTERONE PREMIX) 360-4.14 MG/200ML-% (1.8 mg/mL) IV infusion  30 mg/hr Intravenous Continuous Irine Seal, MD 16.67 mL/hr at 06/19/20 [phone]  30 mg/hr at 06/19/20 1509   apixaban (ELIQUIS) tablet 5 mg  5 mg Oral BID Enzo Bi, MD   5 mg at 06/19/20 1515   ascorbic acid (VITAMIN C) tablet 500 mg  500 mg Oral Daily Irine Seal, MD   500 mg at 06/19/20 0846   brimonidine (ALPHAGAN) 0.2 % ophthalmic solution 1 drop  1 drop Both Eyes BID Irine Seal, MD   1 drop at 06/19/20 0847   Chlorhexidine Gluconate Cloth 2 % PADS 6 each  6 each Topical Q0600 Irine Seal, MD       diltiazem (CARDIZEM) tablet 30 mg  30 mg Oral Q6H Kate Sable, MD   30 mg at 06/19/20 1734   guaiFENesin-dextromethorphan (ROBITUSSIN DM) 100-10 MG/5ML syrup 10 mL  10 mL Oral Q4H PRN Irine Seal, MD   10 mL at 06/19/20 0117   insulin aspart (novoLOG) injection 0-20 Units  0-20 Units Subcutaneous TID AC & HS Irine Seal, MD   4 Units at 06/19/20 1734   insulin detemir (LEVEMIR) injection 8 Units  0.1 Units/kg Subcutaneous BID Irine Seal, MD   8 Units at 06/19/20 0844   MEDLINE mouth rinse  15 mL  Mouth Rinse BID Irine Seal, MD   15 mL at 06/19/20 0903   metoprolol tartrate (LOPRESSOR) tablet 25 mg  25 mg Oral Q6H Agbor-Etang, Aaron Edelman, MD   25 mg at 06/19/20 1506   ondansetron (ZOFRAN) injection 4 mg  4 mg Intravenous Q6H PRN Irine Seal, MD   4 mg at 06/18/20 1030   pantoprazole (PROTONIX) EC tablet 40 mg  40 mg Oral Daily Irine Seal, MD   40 mg at 06/19/20 0846   piperacillin-tazobactam (ZOSYN) IVPB 3.375 g  3.375 g Intravenous Q8H Rowland Lathe, RPH       remdesivir 100 mg in sodium chloride 0.9 % 100 mL IVPB  100 mg Intravenous Daily Irine Seal, MD 200 mL/hr at 06/19/20 0857 100 mg at 06/19/20 0857   rosuvastatin (CRESTOR) tablet 10 mg  10 mg Oral Daily Irine Seal, MD   10 mg at 06/19/20 0846   sodium chloride flush (NS) 0.9 % injection 10-40 mL  10-40 mL Intracatheter Q12H Enzo Bi, MD   10 mL at 06/19/20 0847   sodium chloride flush (NS) 0.9 % injection 10-40 mL  10-40 mL Intracatheter PRN Enzo Bi, MD       sodium chloride flush (NS) 0.9 % injection 3 mL  3 mL Intravenous Q12H Irine Seal, MD   3 mL at 06/19/20 0904   sodium chloride flush (NS) 0.9 % injection 3 mL  3 mL Intravenous PRN Irine Seal, MD       tamsulosin [facility]) capsule 0.4 mg  0.4 mg Oral Daily Irine Seal, MD   0.4 mg at 06/19/20 0845   vitamin B-12 (CYANOCOBALAMIN) tablet 1,000 mcg  1,000 mcg Oral Daily Irine Seal, MD   1,000 mcg at 06/19/20 0846   zinc sulfate capsule 220 mg  220 mg Oral Daily Irine Seal, MD   220 mg at 06/19/20 0846     Objective: Vital signs in last 24 hours: Temp:  [98.5 F (36.9 C)-100.7 F (38.2 C)] 100.4 F (38 C) (09/19 1600) Pulse Rate:  [36-175] 125 (09/19 1600) Resp:  [16-35] 25 (09/19 1600) BP: (108-159)/(72-136) 145/91 (09/19 1734) SpO2:  [94 %-98 %] 96 % (09/19 1600) Weight:  [80.4 kg] 80.4 kg (09/19 0625)  Intake/Output from previous day: 09/18 0701 - 09/19 0700 In: 1807.8 [P.O.:120; I.V.:1487.8;  IV Piggyback:200] Out: 3201 [Urine:3200;  Stool:1] Intake/Output this shift: No intake/output data recorded.   Physical Exam Vitals reviewed.  Constitutional:      Appearance: She is well-developed.  Abdominal:     Palpations: Abdomen is soft.     Tenderness: There is no abdominal tenderness.  Neurological:     Mental Status: She is alert.     Lab Results:  Recent Labs    06/18/20 0809 06/19/20 0537  WBC 13.7* 13.5*  HGB 11.1* 10.5*  HCT 31.5* 31.2*  PLT 97* 96*   BMET Recent Labs    06/18/20 0809 06/19/20 0537  NA 135 134*  K 3.3* 3.6  CL 106 101  CO2 20* 21*  GLUCOSE 97 140*  BUN 28* 30*  CREATININE 1.75* 2.28*  CALCIUM 8.1* 7.8*   PT/INR Recent Labs    06/18/20 2048  LABPROT 14.4  INR 1.2   ABG No results for input(s): PHART, HCO3 in the last 72 hours.  Invalid input(s): PCO2, PO2  Studies/Results: DG OR UROLOGY CYSTO IMAGE ([facility] ONLY)  Result Date: 06/18/2020 There is no interpretation for this exam.  This order is for images obtained during a surgical procedure.  Please See "Surgeries" Tab for more information regarding the procedure.   ECHOCARDIOGRAM COMPLETE  Result Date: 06/19/2020    ECHOCARDIOGRAM REPORT   Patient Name:   Stephanie [name] Date of Exam: 06/19/2020 Medical Rec #:  [id number]     Height:       66.0 in Accession #:    [id number]    Weight:       177.2 lb Date of Birth:  [birth year]      BSA:          1.900 m Patient Age:    4 years      BP:           125/80 mmHg Patient Gender: F             HR:           153 bpm. Exam Location:  [facility] Procedure: 2D Echo, Cardiac Doppler and Color Doppler Indications:     Atrial Fibrillation 427.31 / I48.91  History:         Patient has no prior history of Echocardiogram examinations.                  Risk Factors:Hypertension and Diabetes.  Sonographer:     Alyse Low Roar Referring Phys:  [place] Diagnosing Phys: Kate Sable MD IMPRESSIONS  1. Left ventricular ejection fraction, by estimation, is 50 to 55%. The left ventricle has low  normal function. The left ventricle has no regional wall motion abnormalities. There is mild left ventricular hypertrophy. Indeterminate diastolic filling due  to E-A fusion.  2. Right ventricular systolic function is normal. The right ventricular size is normal.  3. The mitral valve is normal in structure. No evidence of mitral valve regurgitation. No evidence of mitral stenosis.  4. The aortic valve is normal in structure. Aortic valve regurgitation is not visualized. No aortic stenosis is present. FINDINGS  Left Ventricle: Left ventricular ejection fraction, by estimation, is 50 to 55%. The left ventricle has low normal function. The left ventricle has no regional wall motion abnormalities. The left ventricular internal cavity size was normal in size. There is mild left ventricular hypertrophy. Indeterminate diastolic filling due to E-A fusion. Right Ventricle: The right ventricular size is normal. No increase in right ventricular wall thickness. Right ventricular  systolic function is normal. Left Atrium: Left atrial size was normal in size. Right Atrium: Right atrial size was normal in size. Pericardium: There is no evidence of pericardial effusion. Mitral Valve: The mitral valve is normal in structure. No evidence of mitral valve regurgitation. No evidence of mitral valve stenosis. Tricuspid Valve: The tricuspid valve is grossly normal. Tricuspid valve regurgitation is not demonstrated. No evidence of tricuspid stenosis. Aortic Valve: The aortic valve is normal in structure. Aortic valve regurgitation is not visualized. No aortic stenosis is present. Aortic valve peak gradient measures 2.8 mmHg. Pulmonic Valve: The pulmonic valve was not well visualized. Pulmonic valve regurgitation is not visualized. No evidence of pulmonic stenosis. Aorta: The aortic root is normal in size and structure. Venous: The inferior vena cava was not well visualized. IAS/Shunts: No atrial level shunt detected by color flow Doppler.   LEFT VENTRICLE PLAX 2D LVIDd:         4.34 cm  Diastology LVIDs:         3.24 cm  LV e' medial:    8.05 cm/s LV PW:         1.11 cm  LV E/e' medial:  12.1 LV IVS:        1.38 cm  LV e' lateral:   10.10 cm/s LVOT diam:     1.80 cm  LV E/e' lateral: 9.6 LVOT Area:     2.54 cm  RIGHT VENTRICLE RV Mid diam:    2.70 cm RV S prime:     19.00 cm/s TAPSE (M-mode): 1.6 cm LEFT ATRIUM           Index       RIGHT ATRIUM           Index LA diam:      3.40 cm 1.79 cm/m  RA Area:     12.60 cm LA Vol (A4C): 34.8 ml 18.32 ml/m RA Volume:   27.80 ml  14.63 ml/m  AORTIC VALVE               PULMONIC VALVE AV Area (Vmax): 2.66 cm   PV Vmax:        0.96 m/s AV Vmax:        84.20 cm/s PV Peak grad:   3.7 mmHg AV Peak Grad:   2.8 mmHg   RVOT Peak grad: 4 mmHg LVOT Vmax:      88.10 cm/s  AORTA Ao Root diam: 3.30 cm MITRAL VALVE               TRICUSPID VALVE MV Area (PHT): 3.60 cm    TR Peak grad:   17.0 mmHg MV Decel Time: 211 msec    TR Vmax:        206.00 cm/s MV E velocity: 97.30 cm/s                            SHUNTS                            Systemic Diam: 1.80 cm Kate Sable MD Electronically signed by Kate Sable MD Signature Date/Time: 06/19/2020/4:20:09 PM    Final      Assessment and Plan: 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   She will need office f/u in the next couple of weeks to determine next steps for the UPJ obstruction.  Chronic retention.  Continue foley drainage for now.  AKI.  Creatinine rising despite maximal drainage with good UOP.  Continue to monitor.     LOS: 2 days    Irine Seal 06/19/2020 [phone] ID: [name], female   DOB: 24-Dec-1940, 79 y.o.   MRN: [id number]

## 2020-06-19 NOTE — Progress Notes (Signed)
Pt remains in afib rvr on amio gtt.  MD notified of rate and temp.  See orders.

## 2020-06-19 NOTE — Consult Note (Signed)
ANTICOAGULATION CONSULT NOTE -  Pharmacy Consult for Heparin  Indication: atrial fibrillation  Allergies  Allergen Reactions  . Ibuprofen Itching  . Indomethacin   . Infliximab   . Methotrexate Derivatives   . Moexipril   . Percocet [Oxycodone-Acetaminophen] Itching  . Naprosyn [Naproxen] Rash    Patient Measurements: Height: 5\' 6"  (167.6 cm) Weight: 80.4 kg (177 lb 4 oz) IBW/kg (Calculated) : 59.3 Heparin Dosing Weight: 75.9 kg  Vital Signs: Temp: 98.9 F (37.2 C) (09/19 1200) Temp Source: Oral (09/19 1200) BP: 135/93 (09/19 1506) Pulse Rate: 139 (09/19 1506)  Labs: Recent Labs    06/17/20 0357 06/17/20 0357 06/17/20 1101 06/17/20 1938 06/18/20 0809 06/18/20 2048 06/19/20 0537 06/19/20 1428  HGB 11.8*   < >  --   --  11.1*  --  10.5*  --   HCT 34.2*  --   --   --  31.5*  --  31.2*  --   PLT 144*  --   --   --  97*  --  96*  --   APTT  --   --   --   --   --  50*  --   --   LABPROT  --   --   --   --   --  14.4  --   --   INR  --   --   --   --   --  1.2  --   --   HEPARINUNFRC  --   --   --   --   --   --  0.62 0.65  CREATININE 1.57*   < >  --  1.37* 1.75*  --  2.28*  --   TROPONINIHS 61*  --  71*  --   --   --   --   --    < > = values in this interval not displayed.    Estimated Creatinine Clearance: 22.1 mL/min (A) (by C-G formula based on SCr of 2.28 mg/dL (H)).   Medical History: Past Medical History:  Diagnosis Date  . Arthritis   . Asthma   . Cancer ([place]) 1996   colon  . Diabetes mellitus without complication ([place])   . GERD (gastroesophageal reflux disease)   . Glaucoma   . Hyperlipidemia   . Hypertension   . Lumbar disc disease   . Small bowel obstruction ([place])   . Vitamin B 12 deficiency     Medications:  Enoxaparin 40 mg qdaily - last dose 06/18/2020 @ 1020  Assessment: Pharmacy consulted for heparin dosing in a patient with atrial fibrillation. Platelet count is 98. Will obtain baseline aPTT/INR.   Baseline labs have been  ordered  Heparin DW: 75.9 kg D/w Dr. Billie Ruddy to start heparin without a bolus d/t platelet. Start heparin infusion at 1050 units/hr Check anti-Xa level in 8 hours and daily while on heparin, per protocol Continue to monitor H&H and platelets  9/19 @ 0537 HL 0.62, therapeutic x 1.  CBC stable.  Will continue Heparin at current rate and recheck HL in 8 hours to confirm.   Goal of Therapy:  Heparin level 0.3-0.7 units/ml Monitor platelets by anticoagulation protocol: Yes     Plan: 9/19 @ 1428 HL 0.65, therapeutic x2. Continue at current rate and recheck HL in AM. CBC in AM.  Continue to monitor H&H and platelets   Rocky Morel 06/19/2020,3:06 PM

## 2020-06-19 NOTE — TOC Initial Note (Signed)
Transition of Care [facility]) - Initial/Assessment Note    Patient Details  Name: Stephanie [name] MRN: [id number] Date of Birth: 1940/10/18  [facility] of [facility]) CM/SW Contact:    Meriel Flavors, LCSW Phone Number: 06/19/2020, 2:22 PM  Clinical Narrative:                 CSW completed ReAdm screen. Patient lives in home with her spouse, Horris Latino. Patient also has sister in the area that is part of her support system. Husband can provide transportation and makes sure patient has her medication. No TOC needs identified at this time.        Patient Goals and CMS Choice  Get better and go home      Expected Discharge Plan and Services  Patient plans to discharge back home with spouse.                                              Prior Living Arrangements/Services                       Activities of Daily Living Home Assistive Devices/Equipment: Bedside commode/3-in-1, CBG Meter, Hearing aid, Walker (specify type) (roll aid walker with seat) ADL Screening (condition at time of admission) Patient's cognitive ability adequate to safely complete daily activities?: Yes Is the patient deaf or have difficulty hearing?: Yes Does the patient have difficulty seeing, even when wearing glasses/contacts?: No Does the patient have difficulty concentrating, remembering, or making decisions?: No Patient able to express need for assistance with ADLs?: Yes Does the patient have difficulty dressing or bathing?: No Independently performs ADLs?: Yes (appropriate for developmental age) Does the patient have difficulty walking or climbing stairs?: Yes Weakness of Legs: Left Weakness of Arms/Hands: None  Permission Sought/Granted                  Emotional Assessment              Admission diagnosis:  CAP (community acquired pneumonia) [J18.9] AKI (acute kidney injury) ([place]) [N17.9] Paroxysmal SVT (supraventricular tachycardia) ([place]) [I47.1] Sepsis ([place])  [A41.9] Sepsis with acute renal failure without septic shock, due to unspecified organism, unspecified acute renal failure type ([place]) [A41.9, R65.20, N17.9] COVID-19 [U07.1] Patient Active Problem List   Diagnosis Date Noted  . Pressure injury of skin 06/19/2020  . Acute pyelonephritis 06/18/2020  . Hydronephrosis, right 06/18/2020  . Lactic acid acidosis 06/18/2020  . Hyperglycemia 06/18/2020  . Hx of essential hypertension 06/18/2020  . 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 Atrial fibrillation with rapid ventricular response ([place])   . Essential hypertension   . Pure hypercholesterolemia   . Diabetes mellitus ([place]) 06/17/2020  . Rheumatoid arthritis ([place]) 06/17/2020  . COVID-19 virus infection 06/17/2020  . AKI (acute kidney injury) ([place]) 06/17/2020  . Chronic left SI joint pain 04/19/2020  . 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[place]) 04/19/2020  . Chronic radicular lumbar pain 04/19/2020  . Lumbar spondylosis 04/19/2020  . Chronic pain syndrome 04/19/2020  . Sepsis ([place]) 07/05/2019  . CAP (community acquired pneumonia) 07/05/2019  . Gastroenteritis 07/05/2019  . DM type 2 with diabetic mixed hyperlipidemia ([place]) 04/30/2018  . Elevated serum creatinine 10/16/2017  . Rheumatoid arthritis involving multiple sites with positive rheumatoid factor ([place]) 08/13/2016  . Hyperlipidemia, mixed 04/18/2016  . Gross hematuria 11/01/2015  . History  of colon cancer 10/18/2014  . Squamous cell metaplasia of urinary bladder 04/29/2014  . Osteoarthritis of knee 04/01/2014  . B-complex deficiency 03/03/2014  . Benign essential hypertension 01/16/2014  . Unspecified thoracic, thoracolumbar and lumbosacral intervertebral disc disorder 01/16/2014  . Malignant neoplasm of colon ([place]) 01/16/2014  . Bladder neoplasm of uncertain malignant potential 09/14/2013  . Nocturia 08/05/2013  . Scoliosis (and kyphoscoliosis), idiopathic 06/24/2013  . Chronic cystitis 05/04/2013  . Increased  frequency of urination 05/04/2013  . Incomplete emptying of bladder 05/04/2013   PCP:  Rusty Aus, MD Pharmacy:   [facility] [place], [address] [place] [address] Phone: [phone] Fax: [phone]     Social Determinants of Health (SDOH) Interventions    Readmission Risk Interventions Readmission Risk Prevention Plan 06/19/2020  Transportation Screening Complete  Medication Review ([place]) Complete  PCP or Specialist appointment within 3-5 days of discharge Complete  HRI or [place] Complete  SW Recovery Care/Counseling Consult Complete  [place] Not Applicable  Some recent data might be hidden

## 2020-06-19 NOTE — Plan of Care (Signed)
Discussed with patient plan of care for the evening, pain management, foley care and hypoglyemia with some teach back displayed

## 2020-06-19 NOTE — Progress Notes (Addendum)
Progress Note  Patient Name: Stephanie [name] Date of Encounter: 06/19/2020  [facility] Cardiologist: New- [facility]ang rounding  Subjective   Feels tired and sleepy. had renal stent placed yesterday, heart rate still elevated, afebrile.  Probably urine noted from standpoints after placement as per documentation.  She denies back pain, chest pain or shortness of breath.  Inpatient Medications    Scheduled Meds: . acidophilus  1 capsule Oral Daily  . vitamin C  500 mg Oral Daily  . brimonidine  1 drop Both Eyes BID  . Chlorhexidine Gluconate Cloth  6 each Topical Q0600  . diltiazem  30 mg Oral Q6H  . insulin aspart  0-20 Units Subcutaneous TID AC & HS  . insulin detemir  0.1 Units/kg Subcutaneous BID  . mouth rinse  15 mL Mouth Rinse BID  . metoprolol tartrate  25 mg Oral Q6H  . pantoprazole  40 mg Oral Daily  . rosuvastatin  10 mg Oral Daily  . sodium chloride flush  10-40 mL Intracatheter Q12H  . sodium chloride flush  3 mL Intravenous Q12H  . tamsulosin  0.4 mg Oral Daily  . vitamin B-12  1,000 mcg Oral Daily  . zinc sulfate  220 mg Oral Daily   Continuous Infusions: . sodium chloride    . amiodarone 30 mg/hr (06/19/20 0244)  . cefTRIAXone (ROCEPHIN)  IV 2 g (06/18/20 2131)  . heparin 1,050 Units/hr (06/18/20 2120)  . remdesivir 100 mg in NS 100 mL 100 mg (06/19/20 0857)   PRN Meds: sodium chloride, acetaminophen, guaiFENesin-dextromethorphan, ondansetron (ZOFRAN) IV, sodium chloride flush, sodium chloride flush   Vital Signs    Vitals:   06/19/20 1150 06/19/20 1155 06/19/20 1200 06/19/20 1205  BP:   (!) 143/90   Pulse: (!) 116 (!) 106 (!) 118 94  Resp: (!) 27 (!) 23 16 16   Temp:   98.9 F (37.2 C)   TempSrc:   Oral   SpO2: 97% 96% 97% 97%  Weight:      Height:        Intake/Output Summary (Last 24 hours) at 06/19/2020 1225 Last data filed at 06/19/2020 0626 Gross per 24 hour  Intake 1687.76 ml  Output 3201 ml  Net -1513.24 ml   Last 3 Weights  06/19/2020 06/18/2020 06/17/2020  Weight (lbs) 177 lb 4 oz 176 lb 12.9 oz 166 lb  Weight (kg) 80.4 kg 80.2 kg 75.297 kg      Telemetry    Atrial fibrillation heart rate 138- Personally Reviewed  ECG    New tracing obtained- Personally Reviewed  Physical Exam   GEN: No acute distress, looks tired, soft-spoken.   Neck: No JVD Cardiac:  Irregular irregular.  Respiratory:  Points with Tory effort GI: Soft, nontender, non-distended  MS: No edema; No deformity. Neuro:  Nonfocal  Psych: Normal affect   Labs    High Sensitivity Troponin:   Recent Labs  Lab 06/17/20 0357 06/17/20 1101  TROPONINIHS 61* 71*      Chemistry Recent Labs  Lab 06/17/20 0357 06/17/20 0357 06/17/20 1938 06/18/20 0809 06/19/20 0537  NA 133*   < > 135 135 134*  K 3.9   < > 3.3* 3.3* 3.6  CL 102   < > 102 106 101  CO2 18*   < > 20* 20* 21*  GLUCOSE 344*   < > 203* 97 140*  BUN 38*   < > 28* 28* 30*  CREATININE 1.57*   < > 1.37* 1.75* 2.28*  CALCIUM 8.9   < > 8.4* 8.1* 7.8*  PROT 6.2*  --   --  5.3* 5.9*  ALBUMIN 3.0*  --   --  2.2* 2.3*  AST 27  --   --  26 30  ALT 21  --   --  15 15  ALKPHOS 83  --   --  84 110  BILITOT 1.0  --   --  0.6 0.5  GFRNONAA 31*   < > 37* 28* 20*  GFRAA 36*   < > 43* 32* 23*  ANIONGAP 13   < > 13 9 12    < > = values in this interval not displayed.     Hematology Recent Labs  Lab 06/17/20 0357 06/18/20 0809 06/19/20 0537  WBC 20.4* 13.7* 13.5*  RBC 4.15 3.91 3.73*  HGB 11.8* 11.1* 10.5*  HCT 34.2* 31.5* 31.2*  MCV 82.4 80.6 83.6  MCH 28.4 28.4 28.2  MCHC 34.5 35.2 33.7  RDW 14.1 14.5 14.6  PLT 144* 97* 96*    BNP Recent Labs  Lab 06/18/20 0809  BNP 410.1*     DDimer No results for input(s): DDIMER in the last 168 hours.   Radiology    DG OR UROLOGY CYSTO IMAGE ([place])  Result Date: 06/18/2020 There is no interpretation for this exam.  This order is for images obtained during a surgical procedure.  Please See "Surgeries" Tab for more  information regarding the procedure.    Cardiac Studies   Echocardiogram pending  Patient Profile     79 y.o. female with history of hypertension, diabetes, hyperlipidemia, rheumatoid arthritis, chronic urinary retention requiring self-catheterization presenting with fevers chills, diagnosed with sepsis likely from urinary origin, underwent right renal stent placement due to obstruction.  Patient being seen for new onset atrial fibrillation with rapid ventricular response.  Assessment & Plan    1.    Persistent atrial fibrillation -Heart rate elevated -CHA2DS2-VASc of 5 (htn, age, dm, gender) -Underlying infection, systemic inflammatory response also contributing to tachycardia. -Continue amiodarone IV (unsure if receiving as line was occluded on exam and arm appears infiltrated). May need to stop -Start Lopressor 25 mg every 6h, start Cardizem 30 mg every 6h for now. Can switch to long acting agents when heart rate controlled/improves -Heart rates less than 120 in light of underlying infection okay. -If persistently elevated heart rates, will consider renally dosed digoxin. -On heparin drip -Recommend NOAC upon discharge -Echocardiogram pending  2.  Hypertension -BP controlled -Amiodarone, Lopressor, Cardizem as above   3.  Hyperlipidemia -PTA Crestor  4. Septis, urinary obstruction -mgmt as per primary team, urology  Greater than 50% was spent in counseling and coordination of care with patient Total encounter time 35 minutes       Signed, Kate Sable, MD  06/19/2020, 12:25 PM

## 2020-06-19 NOTE — Consult Note (Signed)
ANTICOAGULATION CONSULT NOTE -  Pharmacy Consult for Heparin  Indication: atrial fibrillation  Allergies  Allergen Reactions  . Ibuprofen Itching  . Indomethacin   . Infliximab   . Methotrexate Derivatives   . Moexipril   . Percocet [Oxycodone-Acetaminophen] Itching  . Naprosyn [Naproxen] Rash    Patient Measurements: Height: 5\' 6"  (167.6 cm) Weight: 80.4 kg (177 lb 4 oz) IBW/kg (Calculated) : 59.3 Heparin Dosing Weight: 75.9 kg  Vital Signs: Temp: 98.5 F (36.9 C) (09/19 0625) Temp Source: Oral (09/19 0625) BP: 159/136 (09/19 0500) Pulse Rate: 49 (09/19 0500)  Labs: Recent Labs    06/17/20 0357 06/17/20 0357 06/17/20 1101 06/17/20 1938 06/18/20 0809 06/18/20 2048 06/19/20 0537  HGB 11.8*   < >  --   --  11.1*  --  10.5*  HCT 34.2*  --   --   --  31.5*  --  31.2*  PLT 144*  --   --   --  97*  --  96*  APTT  --   --   --   --   --  50*  --   LABPROT  --   --   --   --   --  14.4  --   INR  --   --   --   --   --  1.2  --   HEPARINUNFRC  --   --   --   --   --   --  0.62  CREATININE 1.57*  --   --  1.37* 1.75*  --   --   TROPONINIHS 61*  --  71*  --   --   --   --    < > = values in this interval not displayed.    Estimated Creatinine Clearance: 28.8 mL/min (A) (by C-G formula based on SCr of 1.75 mg/dL (H)).   Medical History: Past Medical History:  Diagnosis Date  . Arthritis   . Asthma   . Cancer ([place]) 1996   colon  . Diabetes mellitus without complication ([place])   . GERD (gastroesophageal reflux disease)   . Glaucoma   . Hyperlipidemia   . Hypertension   . Lumbar disc disease   . Small bowel obstruction ([place])   . Vitamin B 12 deficiency     Medications:  Enoxaparin 40 mg qdaily - last dose 06/18/2020 @ 1020  Assessment: Pharmacy consulted for heparin dosing in a patient with atrial fibrillation. Platelet count is 98. Will obtain baseline aPTT/INR.   Goal of Therapy:  Heparin level 0.3-0.7 units/ml Monitor platelets by anticoagulation  protocol: Yes   Plan:  Baseline labs have been ordered  Heparin DW: 75.9 kg D/w Dr. Billie Ruddy to start heparin without a bolus d/t platelet. Start heparin infusion at 1050 units/hr Check anti-Xa level in 8 hours and daily while on heparin, per protocol Continue to monitor H&H and platelets  9/19 @ 0537 HL 0.62, therapeutic x 1.  CBC stable.  Will continue Heparin at current rate and recheck HL in 8 hours to confirm.   Hart Robinsons A 06/19/2020,6:43 AM

## 2020-06-19 NOTE — Progress Notes (Signed)
Heart rate remains b/t 128-160's.  NP made aware.  Cards to see today.

## 2020-06-19 NOTE — Progress Notes (Signed)
Answered call from husband and updated with wife's permission.

## 2020-06-19 NOTE — Progress Notes (Signed)
Hypoglycemic Event  CBG: 69  Treatment: See MAR  Symptoms: asymptomatic  Follow-up CBG: Time:2249 CBG Result:* 129  Possible Reasons for Event: not eating well  Comments/MD notified: will notify with recheck    Stephanie [name]

## 2020-06-19 NOTE — Progress Notes (Signed)
Patient ID: Stephanie [name], female   DOB: 1941/04/05, 79 y.o.   MRN: [id number]  Chart rounds performed.  S/P right stent insertion. UOP has improved.  Tmax is 100.7 but now afebrile with improved BP and HR.  Lactate has normalized and WBC is down but  AKI persists with a further increase in Cr to 2.28.  BP (!) 143/90   Pulse 94   Temp 98.9 F (37.2 C) (Oral)   Resp 16   Ht 5\' 6"  (1.676 m)   Wt 80.4 kg   SpO2 97%   BMI 28.61 kg/m    RUPJ obstruction with sepsis.   Clinically improving s/p right stent insertion but Cr continues to rise.  Marland Kitchen   Urinary retention.   Continue foley catheter drainage until sufficiently recovered to resume self catheterization.

## 2020-06-19 NOTE — Consult Note (Addendum)
Pharmacy Antibiotic Note  Stephanie [name] is a 79 y.o. female admitted on 06/17/2020 with pyelonephritis (spike in fever while on Ceftriaxone).  Pharmacy has been consulted for Zosyn dosing.  Patient is currently on Ceftriaxone. BCx and UCx grew K. Pneumoniae and sensitive to CTX and Zosyn.    Plan: 1. Will d/c CTX.   2. Zosyn 3.375 g Q8H (extended infusion). Will monitor renal function.   Height: 5\' 6"  (167.6 cm) Weight: 80.4 kg (177 lb 4 oz) IBW/kg (Calculated) : 59.3  Temp (24hrs), Avg:99.5 F (37.5 C), Min:98.5 F (36.9 C), Max:100.7 F (38.2 C)  Recent Labs  Lab 06/17/20 0357 06/17/20 1101 06/17/20 1935 06/17/20 1938 06/17/20 2217 06/18/20 0809 06/19/20 0537  WBC 20.4*  --   --   --   --  13.7* 13.5*  CREATININE 1.57*  --   --  1.37*  --  1.75* 2.28*  LATICACIDVEN 3.1* 2.5* 2.8*  --  1.6  --   --     Estimated Creatinine Clearance: 22.1 mL/min (A) (by C-G formula based on SCr of 2.28 mg/dL (H)).    Allergies  Allergen Reactions  . Ibuprofen Itching  . Indomethacin   . Infliximab   . Methotrexate Derivatives   . Moexipril   . Percocet [Oxycodone-Acetaminophen] Itching  . Naprosyn [Naproxen] Rash     Thank you for allowing pharmacy to be a part of this patient's care.  Rowland Lathe 06/19/2020 7:47 PM

## 2020-06-19 NOTE — Progress Notes (Signed)
*  PRELIMINARY RESULTS* Echocardiogram  has been performed.  Jonette Mate Cassidi Modesitt 06/19/2020, 4:02 PM

## 2020-06-20 ENCOUNTER — Inpatient Hospital Stay: Payer: Medicare Other

## 2020-06-20 ENCOUNTER — Other Ambulatory Visit: Payer: Medicare Other

## 2020-06-20 DIAGNOSIS — N179 Acute kidney failure, unspecified: Secondary | ICD-10-CM

## 2020-06-20 DIAGNOSIS — N10 Acute tubulo-interstitial nephritis: Secondary | ICD-10-CM

## 2020-06-20 LAB — CBC
HCT: 29.6 % — ABNORMAL LOW (ref 36.0–46.0)
Hemoglobin: 10.4 g/dL — ABNORMAL LOW (ref 12.0–15.0)
MCH: 28.2 pg (ref 26.0–34.0)
MCHC: 35.1 g/dL (ref 30.0–36.0)
MCV: 80.2 fL (ref 80.0–100.0)
Platelets: 94 10*3/uL — ABNORMAL LOW (ref 150–400)
RBC: 3.69 MIL/uL — ABNORMAL LOW (ref 3.87–5.11)
RDW: 14.9 % (ref 11.5–15.5)
WBC: 12.4 10*3/uL — ABNORMAL HIGH (ref 4.0–10.5)
nRBC: 0 % (ref 0.0–0.2)

## 2020-06-20 LAB — C-REACTIVE PROTEIN: CRP: 32.6 mg/dL — ABNORMAL HIGH (ref <1.0)

## 2020-06-20 LAB — MAGNESIUM: Magnesium: 1.7 mg/dL (ref 1.7–2.4)

## 2020-06-20 LAB — GLUCOSE, CAPILLARY
Glucose-Capillary: 179 mg/dL — ABNORMAL HIGH (ref 70–99)
Glucose-Capillary: 160 mg/dL — ABNORMAL HIGH (ref 70–99)
Glucose-Capillary: 170 mg/dL — ABNORMAL HIGH (ref 70–99)
Glucose-Capillary: 172 mg/dL — ABNORMAL HIGH (ref 70–99)
Glucose-Capillary: 152 mg/dL — ABNORMAL HIGH (ref 70–99)

## 2020-06-20 LAB — BASIC METABOLIC PANEL
Anion gap: 13 (ref 5–15)
BUN: 33 mg/dL — ABNORMAL HIGH (ref 8–23)
CO2: 19 mmol/L — ABNORMAL LOW (ref 22–32)
Calcium: 7 mg/dL — ABNORMAL LOW (ref 8.9–10.3)
Chloride: 99 mmol/L (ref 98–111)
Creatinine, Ser: 2.93 mg/dL — ABNORMAL HIGH (ref 0.44–1.00)
GFR calc Af Amer: 17 mL/min — ABNORMAL LOW (ref >60)
GFR calc non Af Amer: 15 mL/min — ABNORMAL LOW (ref >60)
Glucose, Bld: 155 mg/dL — ABNORMAL HIGH (ref 70–99)
Potassium: 2.6 mmol/L — CL (ref 3.5–5.1)
Sodium: 131 mmol/L — ABNORMAL LOW (ref 135–145)

## 2020-06-20 MED ORDER — METOPROLOL TARTRATE 50 MG PO TABS
50.0000 mg | ORAL_TABLET | Freq: Four times a day (QID) | ORAL | Status: DC
  Administered 2020-06-20 – 2020-06-22 (×8): 50 mg via ORAL
  Filled 2020-06-20 (×9): qty 1

## 2020-06-20 MED ORDER — PIPERACILLIN-TAZOBACTAM 3.375 G IVPB
3.3750 g | Freq: Two times a day (BID) | INTRAVENOUS | Status: DC
  Administered 2020-06-20 – 2020-06-23 (×6): 3.375 g via INTRAVENOUS
  Filled 2020-06-20 (×8): qty 50

## 2020-06-20 MED ORDER — POTASSIUM CHLORIDE CRYS ER 20 MEQ PO TBCR
40.0000 meq | EXTENDED_RELEASE_TABLET | ORAL | Status: AC
  Administered 2020-06-20 (×2): 40 meq via ORAL
  Filled 2020-06-20 (×2): qty 2

## 2020-06-20 NOTE — Progress Notes (Signed)
Progress Note  Patient Name: Stephanie [name] Date of Encounter: 06/20/2020  Primary Cardiologist: new to [place]- consult by Agbor-Etang  Subjective   No acute overnight events. She remains in Afib with improving ventricular rates. Labs are pending this morning.   Inpatient Medications    Scheduled Meds: . acidophilus  1 capsule Oral Daily  . apixaban  5 mg Oral BID  . vitamin C  500 mg Oral Daily  . brimonidine  1 drop Both Eyes BID  . Chlorhexidine Gluconate Cloth  6 each Topical Q0600  . diltiazem  30 mg Oral Q6H  . insulin aspart  0-20 Units Subcutaneous TID AC & HS  . insulin detemir  0.1 Units/kg Subcutaneous BID  . mouth rinse  15 mL Mouth Rinse BID  . metoprolol tartrate  25 mg Oral Q6H  . pantoprazole  40 mg Oral Daily  . rosuvastatin  10 mg Oral Daily  . sodium chloride flush  10-40 mL Intracatheter Q12H  . sodium chloride flush  3 mL Intravenous Q12H  . tamsulosin  0.4 mg Oral Daily  . vitamin B-12  1,000 mcg Oral Daily  . zinc sulfate  220 mg Oral Daily   Continuous Infusions: . sodium chloride 10 mL/hr at 06/20/20 0348  . sodium chloride Stopped (06/20/20 0442)  . amiodarone 30 mg/hr (06/20/20 0500)  . piperacillin-tazobactam (ZOSYN)  IV 12.5 mL/hr at 06/20/20 0500  . remdesivir 100 mg in NS 100 mL Stopped (06/19/20 0936)   PRN Meds: sodium chloride, acetaminophen, guaiFENesin-dextromethorphan, ondansetron (ZOFRAN) IV, sodium chloride flush, sodium chloride flush   Vital Signs    Vitals:   06/20/20 0503 06/20/20 0538 06/20/20 0600 06/20/20 0700  BP:   136/70 131/72  Pulse:   96 100  Resp: 19  20 20   Temp:      TempSrc:      SpO2:  98% 96% 95%  Weight:      Height:        Intake/Output Summary (Last 24 hours) at 06/20/2020 0802 Last data filed at 06/20/2020 0537 Gross per 24 hour  Intake 1944.77 ml  Output 1825 ml  Net 119.77 ml   Filed Weights   06/17/20 0352 06/18/20 0625 06/19/20 0625  Weight: 75.3 kg 80.2 kg 80.4 kg    Telemetry      Afib with ventricular rates in the 90s to low 100s bpm - Personally Reviewed  ECG    No new tracings - Personally Reviewed  Physical Exam   Please see MD attestation   Labs    Chemistry Recent Labs  Lab 06/17/20 0357 06/17/20 0357 06/17/20 1938 06/18/20 0809 06/19/20 0537  NA 133*   < > 135 135 134*  K 3.9   < > 3.3* 3.3* 3.6  CL 102   < > 102 106 101  CO2 18*   < > 20* 20* 21*  GLUCOSE 344*   < > 203* 97 140*  BUN 38*   < > 28* 28* 30*  CREATININE 1.57*   < > 1.37* 1.75* 2.28*  CALCIUM 8.9   < > 8.4* 8.1* 7.8*  PROT 6.2*  --   --  5.3* 5.9*  ALBUMIN 3.0*  --   --  2.2* 2.3*  AST 27  --   --  26 30  ALT 21  --   --  15 15  ALKPHOS 83  --   --  84 110  BILITOT 1.0  --   --  0.6  0.5  GFRNONAA 31*   < > 37* 28* 20*  GFRAA 36*   < > 43* 32* 23*  ANIONGAP 13   < > 13 9 12    < > = values in this interval not displayed.     Hematology Recent Labs  Lab 06/17/20 0357 06/18/20 0809 06/19/20 0537  WBC 20.4* 13.7* 13.5*  RBC 4.15 3.91 3.73*  HGB 11.8* 11.1* 10.5*  HCT 34.2* 31.5* 31.2*  MCV 82.4 80.6 83.6  MCH 28.4 28.4 28.2  MCHC 34.5 35.2 33.7  RDW 14.1 14.5 14.6  PLT 144* 97* 96*    Cardiac EnzymesNo results for input(s): TROPONINI in the last 168 hours. No results for input(s): TROPIPOC in the last 168 hours.   BNP Recent Labs  Lab 06/18/20 0809  BNP 410.1*     DDimer No results for input(s): DDIMER in the last 168 hours.   Radiology      Cardiac Studies   2D echo 06/19/2020: 1. Left ventricular ejection fraction, by estimation, is 50 to 55%. The  left ventricle has low normal function. The left ventricle has no regional  wall motion abnormalities. There is mild left ventricular hypertrophy.  Indeterminate diastolic filling due  to E-A fusion.  2. Right ventricular systolic function is normal. The right ventricular  size is normal.  3. The mitral valve is normal in structure. No evidence of mitral valve  regurgitation. No evidence of  mitral stenosis.  4. The aortic valve is normal in structure. Aortic valve regurgitation is  not visualized. No aortic stenosis is present.  Patient Profile     79 y.o. female with history of hypertension, diabetes, hyperlipidemia, rheumatoid arthritis, chronic urinary retention requiring self-catheterization presenting with fevers chills, diagnosed with sepsis likely from urinary origin, underwent right renal stent placement due to obstruction.  Patient being seen for new onset atrial fibrillation with rapid ventricular response in the setting of sepsis secondary to right pyelonephritis and COVID-19 infection.   Assessment & Plan    1. New onset Afib with RVR: -She remains in Afib with ventricular rates overall improving in the 90s to low 100s bpm mostly -In the setting of sepsis with right pyelonephritis and [id number] -Of uncertain duration and previously not anticoagulated, in this setting, we will stop IV amiodarone to minimize risk of pharmacological cardioversion without adequate anticoagulation  -Titrate Lopressor to 50 mg q 6 hours -Continue short acting diltiazem  -CHADS2VASc 5 (HTN, age x 2, DM, gender) -She has been transitioned to Eliquis 5 mg bid on 9/19, which should be continued   -As her acute infection improves, if she remains in Afib would plan for outpatient DCCV after she has been adequately anticoagulated  -If her ventricular rates prove to be difficult to control with ambulation, or if she is symptomatic with ambulation, she would need TEE-guided DCCV prior to discharge  -TSH normal -Will avoid adding KCl to goal of 4.0 with worsening renal function on last BUN/SCr  2. HTN: -Blood pressure well controlled  -Metoprolol and diltiazem as above  3. HLD: -Crestor   4. Sepsis secondary to right pyelonephritis: -Management per IM and Urology   5. COVID-19: -Fully vaccinated  -Management per IM  6. AKI: -In the setting of the above -Labs pending at this time  7.  Anemia/thrombocytopenia: -Stable -Off heparin gtt, now on Eliquis   For questions or updates, please contact [place] Please consult [email or web address] for contact info under Cardiology/STEMI.    Signed, Christell Faith, PA-C [facility]  Pager: [phone] 06/20/2020, 8:02 AM

## 2020-06-20 NOTE — Progress Notes (Signed)
Patient appears to have converted to NSR/ST (Sinus Arrhythmia)  Paged MD for 12 EKG to confirm since on Amiodarone gtt.

## 2020-06-20 NOTE — Progress Notes (Signed)
Urology Consult Follow Up  Subjective: POD # 2 right ureteral stent placement for right UPJ obstruction with sepsis  Patient sleeping, but she is easily aroused and answering questions appropriately.   She does not report any flank pain.    Urine culture positive for Klebsiella pneumoniae.  Blood cultures positive for E. Coli and Klebsiella pneumoniae.   Patient still with fevers yesterday, so she was switched from Ceftriaxone to Zosyn for which the organism is sensitive.   Afebrile this am.    Tachycardic, remaining in Afib.  Cardiology following.    AKI - creatinine continuing to trend upward   CBC - down from 13.5 to 12.4 this am   Foley in place draining dark yellow urine with some debris.    Anti-infectives: Anti-infectives (From admission, onward)   Start     Dose/Rate Route Frequency Ordered Stop   06/19/20 2000  piperacillin-tazobactam (ZOSYN) IVPB 3.375 g        3.375 g 12.5 mL/hr over 240 Minutes Intravenous Every 8 hours 06/19/20 1915     06/18/20 1800  cefTRIAXone (ROCEPHIN) 2 g in sodium chloride 0.9 % 100 mL IVPB  Status:  Discontinued        2 g 200 mL/hr over 30 Minutes Intravenous Every 24 hours 06/17/20 1545 06/17/20 1545   06/18/20 1000  remdesivir 100 mg in sodium chloride 0.9 % 100 mL IVPB       "Followed by" Linked Group Details   100 mg 200 mL/hr over 30 Minutes Intravenous Daily 06/17/20 0809 06/22/20 0959   06/18/20 0800  azithromycin (ZITHROMAX) 500 mg in sodium chloride 0.9 % 250 mL IVPB  Status:  Discontinued        500 mg 250 mL/hr over 60 Minutes Intravenous Every 24 hours 06/17/20 0805 06/17/20 0914   06/17/20 1800  cefTRIAXone (ROCEPHIN) 2 g in sodium chloride 0.9 % 100 mL IVPB  Status:  Discontinued        2 g 200 mL/hr over 30 Minutes Intravenous Every 24 hours 06/17/20 1545 06/19/20 1915   06/17/20 1500  cefTRIAXone (ROCEPHIN) 2 g in sodium chloride 0.9 % 100 mL IVPB  Status:  Discontinued        2 g 200 mL/hr over 30 Minutes Intravenous  Every 24 hours 06/17/20 0805 06/17/20 1545   06/17/20 1000  remdesivir 200 mg in sodium chloride 0.9% 250 mL IVPB       "Followed by" Linked Group Details   200 mg 580 mL/hr over 30 Minutes Intravenous Once 06/17/20 0809 06/17/20 1216   06/17/20 0530  ceFEPIme (MAXIPIME) 1 g in sodium chloride 0.9 % 100 mL IVPB        1 g 200 mL/hr over 30 Minutes Intravenous  Once 06/17/20 0515 06/17/20 0810   06/17/20 0530  vancomycin (VANCOCIN) IVPB 1000 mg/200 mL premix        1,000 mg 200 mL/hr over 60 Minutes Intravenous  Once 06/17/20 0515 06/17/20 0929   06/17/20 0530  azithromycin (ZITHROMAX) tablet 500 mg        500 mg Oral  Once 06/17/20 0515 06/17/20 0723      Current Facility-Administered Medications  Medication Dose Route Frequency Provider Last Rate Last Admin  . 0.9 %  sodium chloride infusion  250 mL Intravenous PRN Irine Seal, MD 10 mL/hr at 06/20/20 0348 Rate Change at 06/20/20 0348  . 0.9 %  sodium chloride infusion   Intravenous Continuous Enzo Bi, MD   Paused at 06/20/20 [phone]  .  acetaminophen (TYLENOL) tablet 650 mg  650 mg Oral Q6H PRN Irine Seal, MD   650 mg at 06/19/20 2215  . acidophilus (RISAQUAD) capsule 1 capsule  1 capsule Oral Daily Irine Seal, MD   1 capsule at 06/19/20 0845  . apixaban (ELIQUIS) tablet 5 mg  5 mg Oral BID Enzo Bi, MD   5 mg at 06/19/20 2218  . ascorbic acid (VITAMIN C) tablet 500 mg  500 mg Oral Daily Irine Seal, MD   500 mg at 06/19/20 0846  . brimonidine (ALPHAGAN) 0.2 % ophthalmic solution 1 drop  1 drop Both Eyes BID Irine Seal, MD   1 drop at 06/19/20 2216  . Chlorhexidine Gluconate Cloth 2 % PADS 6 each  6 each Topical Q0600 Irine Seal, MD      . diltiazem (CARDIZEM) tablet 30 mg  30 mg Oral Q6H Kate Sable, MD   30 mg at 06/20/20 0536  . guaiFENesin-dextromethorphan (ROBITUSSIN DM) 100-10 MG/5ML syrup 10 mL  10 mL Oral Q4H PRN Irine Seal, MD   10 mL at 06/20/20 0430  . insulin aspart (novoLOG) injection 0-20 Units  0-20 Units  Subcutaneous TID AC & HS Irine Seal, MD   4 Units at 06/19/20 1734  . insulin detemir (LEVEMIR) injection 8 Units  0.1 Units/kg Subcutaneous BID Irine Seal, MD   8 Units at 06/19/20 0844  . MEDLINE mouth rinse  15 mL Mouth Rinse BID Irine Seal, MD   15 mL at 06/19/20 2221  . metoprolol tartrate (LOPRESSOR) tablet 50 mg  50 mg Oral Q6H Dunn, Ryan M, PA-C      . ondansetron [facility]) injection 4 mg  4 mg Intravenous Q6H PRN Irine Seal, MD   4 mg at 06/18/20 1030  . pantoprazole (PROTONIX) EC tablet 40 mg  40 mg Oral Daily Irine Seal, MD   40 mg at 06/19/20 0846  . piperacillin-tazobactam (ZOSYN) IVPB 3.375 g  3.375 g Intravenous Q8H Duncan, Asajah R, RPH 12.5 mL/hr at 06/20/20 0500 Rate Verify at 06/20/20 0500  . remdesivir 100 mg in sodium chloride 0.9 % 100 mL IVPB  100 mg Intravenous Daily Irine Seal, MD   Stopped at 06/19/20 [phone]  . rosuvastatin (CRESTOR) tablet 10 mg  10 mg Oral Daily Irine Seal, MD   10 mg at 06/19/20 0846  . sodium chloride flush (NS) 0.9 % injection 10-40 mL  10-40 mL Intracatheter Q12H Enzo Bi, MD   10 mL at 06/19/20 2217  . sodium chloride flush (NS) 0.9 % injection 10-40 mL  10-40 mL Intracatheter PRN Enzo Bi, MD      . sodium chloride flush (NS) 0.9 % injection 3 mL  3 mL Intravenous Q12H Irine Seal, MD   3 mL at 06/19/20 2216  . sodium chloride flush (NS) 0.9 % injection 3 mL  3 mL Intravenous PRN Irine Seal, MD      . tamsulosin [facility]) capsule 0.4 mg  0.4 mg Oral Daily Irine Seal, MD   0.4 mg at 06/19/20 0845  . vitamin B-12 (CYANOCOBALAMIN) tablet 1,000 mcg  1,000 mcg Oral Daily Irine Seal, MD   1,000 mcg at 06/19/20 0846  . zinc sulfate capsule 220 mg  220 mg Oral Daily Irine Seal, MD   220 mg at 06/19/20 0846     Objective: Vital signs in last 24 hours: Temp:  [98.3 F (36.8 C)-100.4 F (38 C)] 98.3 F (36.8 C) (09/20 0437) Pulse Rate:  [38-157] 100 (09/20 0700) Resp:  [16-35] 20 (  09/20 0700) BP: (99-159)/(66-102) 131/72 (09/20 0700) SpO2:   [88 %-99 %] 95 % (09/20 0700)  Intake/Output from previous day: 09/19 0701 - 09/20 0700 In: 1944.8 [P.O.:600; I.V.:1180.3; IV Piggyback:164.5] Out: 1825 [Urine:1825] Intake/Output this shift: No intake/output data recorded.   Physical Exam Constitutional:  Well nourished. Alert and oriented, No acute distress. HEENT: [place] AT,  Trachea midline, oxygen in place Cardiovascular: No clubbing, cyanosis, or edema. Respiratory: Normal respiratory effort, no increased work of breathing. GI: Abdomen is soft, non tender, non distended, no abdominal masses.  GU: No CVA tenderness.  No bladder fullness or masses.   Neurologic: Grossly intact, no focal deficits Psychiatric: Normal mood and affect.  Lab Results:  Recent Labs    06/18/20 0809 06/19/20 0537  WBC 13.7* 13.5*  HGB 11.1* 10.5*  HCT 31.5* 31.2*  PLT 97* 96*   BMET Recent Labs    06/18/20 0809 06/19/20 0537  NA 135 134*  K 3.3* 3.6  CL 106 101  CO2 20* 21*  GLUCOSE 97 140*  BUN 28* 30*  CREATININE 1.75* 2.28*  CALCIUM 8.1* 7.8*   PT/INR Recent Labs    06/18/20 2048  LABPROT 14.4  INR 1.2   ABG No results for input(s): PHART, HCO3 in the last 72 hours.  Invalid input(s): PCO2, PO2  Studies/Results: DG OR UROLOGY CYSTO IMAGE ([facility] ONLY)  Result Date: 06/18/2020 There is no interpretation for this exam.  This order is for images obtained during a surgical procedure.  Please See "Surgeries" Tab for more information regarding the procedure.   ECHOCARDIOGRAM COMPLETE  Result Date: 06/19/2020    ECHOCARDIOGRAM REPORT   Patient Name:   Stephanie [name] Date of Exam: 06/19/2020 Medical Rec #:  [id number]     Height:       66.0 in Accession #:    [id number]    Weight:       177.2 lb Date of Birth:  25-Jul-1941      BSA:          1.900 m Patient Age:    58 years      BP:           125/80 mmHg Patient Gender: F             HR:           153 bpm. Exam Location:  [facility] Procedure: 2D Echo, Cardiac Doppler and Color Doppler  Indications:     Atrial Fibrillation 427.31 / I48.91  History:         Patient has no prior history of Echocardiogram examinations.                  Risk Factors:Hypertension and Diabetes.  Sonographer:     Alyse Low Roar Referring Phys:  [place] Diagnosing Phys: Kate Sable MD IMPRESSIONS  1. Left ventricular ejection fraction, by estimation, is 50 to 55%. The left ventricle has low normal function. The left ventricle has no regional wall motion abnormalities. There is mild left ventricular hypertrophy. Indeterminate diastolic filling due  to E-A fusion.  2. Right ventricular systolic function is normal. The right ventricular size is normal.  3. The mitral valve is normal in structure. No evidence of mitral valve regurgitation. No evidence of mitral stenosis.  4. The aortic valve is normal in structure. Aortic valve regurgitation is not visualized. No aortic stenosis is present. FINDINGS  Left Ventricle: Left ventricular ejection fraction, by estimation, is 50 to 55%. The left ventricle has low  normal function. The left ventricle has no regional wall motion abnormalities. The left ventricular internal cavity size was normal in size. There is mild left ventricular hypertrophy. Indeterminate diastolic filling due to E-A fusion. Right Ventricle: The right ventricular size is normal. No increase in right ventricular wall thickness. Right ventricular systolic function is normal. Left Atrium: Left atrial size was normal in size. Right Atrium: Right atrial size was normal in size. Pericardium: There is no evidence of pericardial effusion. Mitral Valve: The mitral valve is normal in structure. No evidence of mitral valve regurgitation. No evidence of mitral valve stenosis. Tricuspid Valve: The tricuspid valve is grossly normal. Tricuspid valve regurgitation is not demonstrated. No evidence of tricuspid stenosis. Aortic Valve: The aortic valve is normal in structure. Aortic valve regurgitation is not visualized.  No aortic stenosis is present. Aortic valve peak gradient measures 2.8 mmHg. Pulmonic Valve: The pulmonic valve was not well visualized. Pulmonic valve regurgitation is not visualized. No evidence of pulmonic stenosis. Aorta: The aortic root is normal in size and structure. Venous: The inferior vena cava was not well visualized. IAS/Shunts: No atrial level shunt detected by color flow Doppler.  LEFT VENTRICLE PLAX 2D LVIDd:         4.34 cm  Diastology LVIDs:         3.24 cm  LV e' medial:    8.05 cm/s LV PW:         1.11 cm  LV E/e' medial:  12.1 LV IVS:        1.38 cm  LV e' lateral:   10.10 cm/s LVOT diam:     1.80 cm  LV E/e' lateral: 9.6 LVOT Area:     2.54 cm  RIGHT VENTRICLE RV Mid diam:    2.70 cm RV S prime:     19.00 cm/s TAPSE (M-mode): 1.6 cm LEFT ATRIUM           Index       RIGHT ATRIUM           Index LA diam:      3.40 cm 1.79 cm/m  RA Area:     12.60 cm LA Vol (A4C): 34.8 ml 18.32 ml/m RA Volume:   27.80 ml  14.63 ml/m  AORTIC VALVE               PULMONIC VALVE AV Area (Vmax): 2.66 cm   PV Vmax:        0.96 m/s AV Vmax:        84.20 cm/s PV Peak grad:   3.7 mmHg AV Peak Grad:   2.8 mmHg   RVOT Peak grad: 4 mmHg LVOT Vmax:      88.10 cm/s  AORTA Ao Root diam: 3.30 cm MITRAL VALVE               TRICUSPID VALVE MV Area (PHT): 3.60 cm    TR Peak grad:   17.0 mmHg MV Decel Time: 211 msec    TR Vmax:        206.00 cm/s MV E velocity: 97.30 cm/s                            SHUNTS                            Systemic Diam: 1.80 cm Kate Sable MD Electronically signed by Kate Sable MD Signature Date/Time: 06/19/2020/4:20:09 PM  Final      Assessment and Plan: 79 year old female with rUTI's, right UPJ obstruction and urinary retention managed with [facility] who was admitted for sepsis, AKI, UTI and worsening right hydronephrosis.  She underwent right stent placement on 06/18/2020 and was found to have purulent urine effluxing from the stent.   Recommendations: - continue to trend serum  creatinine and CBC - serum creatinine has not trended down and renal ultrasound has been ordered by Dr. Billie Ruddy  - continue Foley drainage for now - will need outpatient follow up to determine next steps for UPJ obstruction   LOS: 3 days    [facility] 06/20/2020

## 2020-06-20 NOTE — Progress Notes (Signed)
Lab called with a critical value/potassium 2.6. MD aware.

## 2020-06-20 NOTE — Consult Note (Signed)
Pharmacy Antibiotic Note  Stephanie [name] is a 79 y.o. female admitted on 06/17/2020 with pyelonephritis (spike in fever while on Ceftriaxone). 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 BCID identified 4/4 bottles K pneumo and patient was on CTX. Patient spiked fevers while on CTX so abx were broadened to Zosyn on 9/19. BCx and UCx grew K. Pneumoniae and sensitive to CTX and Zosyn.  Pharmacy has been consulted for Zosyn dosing.  Day 4 total IV abx, last fever 9/19 @1600 , Scr 1.37>1.75>2.28>2.93  Plan: Change frequency of Zosyn 3.375 g Q8H (extended infusion) to every 12 hours due to CrCl <61ml/min. Will continue to monitor renal function and adjust when indicated  Height: 5\' 6"  (167.6 cm) Weight: 80.4 kg (177 lb 4 oz) IBW/kg (Calculated) : 59.3  Temp (24hrs), Avg:98.9 F (37.2 C), Min:98.3 F (36.8 C), Max:100.4 F (38 C)  Recent Labs  Lab 06/17/20 0357 06/17/20 1101 06/17/20 1935 06/17/20 1938 06/17/20 2217 06/18/20 0809 06/19/20 0537 06/20/20 0841  WBC 20.4*  --   --   --   --  13.7* 13.5* 12.4*  CREATININE 1.57*  --   --  1.37*  --  1.75* 2.28* 2.93*  LATICACIDVEN 3.1* 2.5* 2.8*  --  1.6  --   --   --     Estimated Creatinine Clearance: 17.2 mL/min (A) (by C-G formula based on SCr of 2.93 mg/dL (H)).    Allergies  Allergen Reactions  . Ibuprofen Itching  . Indomethacin   . Infliximab   . Methotrexate Derivatives   . Moexipril   . Percocet [Oxycodone-Acetaminophen] Itching  . Naprosyn [Naproxen] Rash   Antibiotics this admission 9/17 azithromycin, cefepime, vancomycin x1 9/17 ceftriaxone >> 9/19 9/19 Zosyn >> 5 day course remdesivir starting 9/17  Dose adjustments this admission 9/20 Zosyn 3.375g EI q8hr changed to q12h d/t Crcl <48ml/min  Microbiology  9/17 Bcx: 4/4 bottles K pneumo 9/17 Ucx: K pneumo  Thank you for allowing pharmacy to be a part of this patient's care.  Sherilyn Banker, PharmD Pharmacy Resident  06/20/2020 12:19 PM

## 2020-06-20 NOTE — Progress Notes (Signed)
PROGRESS NOTE    Stephanie [name]  ATF:[id number] DOB: 1941-01-08 DOA: 06/17/2020 PCP: Rusty Aus, MD    Assessment & Plan:   Principal Problem:   Sepsis [facility]) Active Problems:   Benign essential hypertension   Chronic cystitis   Rheumatoid arthritis involving multiple sites with positive rheumatoid factor ([place])   Diabetes mellitus ([place])   Rheumatoid arthritis ([place])   COVID-19 virus infection   AKI (acute kidney injury) ([place])   Atrial fibrillation with rapid ventricular response ([place])   Essential hypertension   Pure hypercholesterolemia   Acute pyelonephritis   Hydronephrosis, right   Lactic acid acidosis   Hyperglycemia   Hx of essential hypertension   Immunosuppression due to drug therapy   Pressure injury of skin   Acquired thrombophilia ([place])   On continuous oral anticoagulation    Stephanie [name] is a 79 y.o. female with medical history significant for COVID-19 viral infection (positive PCR test June 09, 2020), history of diabetes mellitus, history of rheumatoid arthritis on chronic immunosuppressive therapy, hypertension and a remote history of colon cancer who was brought into the ER by EMS for evaluation of fever.  Patient had a T-max of 104F at home.  She complains of generalized weakness and worsening shortness of breath but denies having any nausea, no vomiting, no chest pain or abdominal pain.  Patient does self caths at home. Patient is fully vaccinated She was started on prednisone by her primary care provider for presumed COVID-19 bronchitis   # Sepsis 2/2 right pyelonephritis  --As evidenced by fever with a T-max of 103F, tachycardia, tachypnea, leukocytosis of 20,000 with a left shift and lactic acid of 3.1.  UA positive for infection.  Pt started on ceftriaxone, then broadened to zosyn on 9/19 --CT renal showed "Debris present dependently within the right renal collecting system and bladder"   --Pt received 2L LR f/b MIVF --Urine grew pan-sensitive  Klebsiella. PLAN:  --continue zosyn  # Severe right hydronephrosis --CT renal showed "Progressive right UPJ obstruction compared to prior October of 2020." --right stent placement by urology 9/18 (found frank pus in the kidney) PLAN: --continue Foley  --continue MIVF@100  to keep up with post-obstructive diuresis --strict I/O --renal US today to assess status of hydro  # New onset Afib w RVR, persistent # Acquired thrombophilia  --developed while in the ED.  HR 120's-140's despite multiple doses of IV dilt, IV metop, dilt gtt@15   --started heparin gtt 9/18, transitioned to Eliquis  --It appeared that her right hand IV was infiltrated and all the IV dilt, IV metop and IV amiodarone may have not gotten into her system. --cardiology consulted PLAN: --d/c amio gtt today, per cards --continue oral dilt 30 mg q6h --increase Lopressor to 50 mg q6h, per cards --continue eliquis --treat sepsis  # COVID-19 infection --Pt is fully vaccinated.  Patient's COVID-19 PCR test was positive on 06/09/20.  No overt respiratory symptoms.  CT chest clear.  No real O2 requirement on presentation. --Remdesivir started on admission PLAN: --continue Remdesivir --monitor O2 sats  AKI --on admission creatinine is 1.5.  Baseline Cr 0.8.  AKI due to urinary obstruction and infection, and also contrast. --Cr worsened to 2.93 this morning PLAN: --continue MIVF@100  --US renal today  Lactic acidosis 2/2 sepsis --resolved with IVF.  Hx Diabetes mellitus  hyperglycemia related to systemic steroid administration Patient was started on prednisone as an outpatient by her primary care provider for presumed COVID-19 bronchitis Chest x-ray is negative for pneumonia and patient is  not hypoxic Prednisone d/c'ed. PLAN: Optimize glycemic control with sliding scale coverage with insulin  Hx of HTN --BP drops sometimes from a lot of rate control agents --Hold home amlodipine  History of rheumatoid  arthritis --continue to hold Arava due to sepsis  Hypokalemia, not POA --monitor and replete PRN   DVT prophylaxis: Lovenox SQ Code Status: DNR  Family Communication:  Status is: inpatient Dispo:   The patient is from: home Anticipated d/c is to: home Anticipated d/c date is: >3 days Patient currently is not medically stable to d/c due to: Severe sepsis, right hydro and pyelo requiring IV abx    Subjective and Interval History:  HR controlled this morning.  Pt was more alert, and doing better.     Objective: Vitals:   06/20/20 0700 06/20/20 0845 06/20/20 1130 06/20/20 1202  BP: 131/72 139/82  130/78  Pulse: 100 (!) 105  96  Resp: 20 19  18   Temp:  98.4 F (36.9 C)  98.3 F (36.8 C)  TempSrc:  Oral  Oral  SpO2: 95% 99% 95% 97%  Weight:      Height:        Intake/Output Summary (Last 24 hours) at 06/20/2020 1542 Last data filed at 06/20/2020 1231 Gross per 24 hour  Intake 1944.77 ml  Output 2225 ml  Net -280.23 ml   Filed Weights   06/17/20 0352 06/18/20 0625 06/19/20 0625  Weight: 75.3 kg 80.2 kg 80.4 kg    Examination:   Constitutional: NAD, more alert, oriented HEENT: conjunctivae and lids normal, EOMI CV: irregular. Distal pulses +2.  No cyanosis.   RESP: normal respiratory effort, on 2L GI: +BS, NTND Extremities: No effusions, edema in BLE.  Right hand edematous SKIN: warm, dry and intact Neuro: II - XII grossly intact.  Sensation intact Psych: Normal mood and affect.    Foley present, draining urine with some debris sediments.    Data Reviewed: I have personally reviewed following labs and imaging studies  CBC: Recent Labs  Lab 06/17/20 0357 06/18/20 0809 06/19/20 0537 06/20/20 0841  WBC 20.4* 13.7* 13.5* 12.4*  NEUTROABS 17.8* 10.7* 11.8*  --   HGB 11.8* 11.1* 10.5* 10.4*  HCT 34.2* 31.5* 31.2* 29.6*  MCV 82.4 80.6 83.6 80.2  PLT 144* 97* 96* 94*   Basic Metabolic Panel: Recent Labs  Lab 06/17/20 0357 06/17/20 1935 06/17/20 1938  06/18/20 0809 06/19/20 0537 06/20/20 0841  NA 133*  --  135 135 134* 131*  K 3.9  --  3.3* 3.3* 3.6 2.6*  CL 102  --  102 106 101 99  CO2 18*  --  20* 20* 21* 19*  GLUCOSE 344*  --  203* 97 140* 155*  BUN 38*  --  28* 28* 30* 33*  CREATININE 1.57*  --  1.37* 1.75* 2.28* 2.93*  CALCIUM 8.9  --  8.4* 8.1* 7.8* 7.0*  MG  --  1.8  --  1.8  --  1.7  PHOS  --   --   --  2.9  --   --    GFR: Estimated Creatinine Clearance: 17.2 mL/min (A) (by C-G formula based on SCr of 2.93 mg/dL (H)). Liver Function Tests: Recent Labs  Lab 06/17/20 0357 06/18/20 0809 06/19/20 0537  AST 27 26 30   ALT 21 15 15   ALKPHOS 83 84 110  BILITOT 1.0 0.6 0.5  PROT 6.2* 5.3* 5.9*  ALBUMIN 3.0* 2.2* 2.3*   No results for input(s): LIPASE, AMYLASE in the last 168  hours. No results for input(s): AMMONIA in the last 168 hours. Coagulation Profile: Recent Labs  Lab 06/18/20 2048  INR 1.2   Cardiac Enzymes: No results for input(s): CKTOTAL, CKMB, CKMBINDEX, TROPONINI in the last 168 hours. BNP (last 3 results) No results for input(s): PROBNP in the last 8760 hours. HbA1C: No results for input(s): HGBA1C in the last 72 hours. CBG: Recent Labs  Lab 06/19/20 2211 06/19/20 2249 06/20/20 0645 06/20/20 0846 06/20/20 1203  GLUCAP 69* 129* 179* 160* 170*   Lipid Profile: No results for input(s): CHOL, HDL, LDLCALC, TRIG, CHOLHDL, LDLDIRECT in the last 72 hours. Thyroid Function Tests: Recent Labs    06/18/20 0809  TSH 0.958   Anemia Panel: Recent Labs    06/18/20 0809 06/19/20 0537  FERRITIN 345* 472*   Sepsis Labs: Recent Labs  Lab 06/17/20 0357 06/17/20 1101 06/17/20 1935 06/17/20 2217  PROCALCITON 17.78  --   --   --   LATICACIDVEN 3.1* 2.5* 2.8* 1.6    Recent Results (from the past 240 hour(s))  Blood culture (routine x 2)     Status: Abnormal   Collection Time: 06/17/20  3:57 AM   Specimen: BLOOD  Result Value Ref Range Status   Specimen Description   Final    BLOOD LEFT  HAND Performed at [facility], [address] [place], [place] [address]    Special Requests   Final    BOTTLES DRAWN AEROBIC AND ANAEROBIC Blood Culture adequate volume Performed at [facility], [address] [place], [place] [address]    Culture  Setup Time   Final    Organism ID to follow GRAM NEGATIVE RODS IN BOTH AEROBIC AND ANAEROBIC BOTTLES CRITICAL RESULT CALLED TO, READ BACK BY AND VERIFIED WITH: AMY THOMPSON AT 1534 ON 06/17/20 SNG Performed at [facility], [address] [place], [place] [address]    Culture KLEBSIELLA PNEUMONIAE (A)  Final   Report Status 06/19/2020 FINAL  Final   Organism ID, Bacteria KLEBSIELLA PNEUMONIAE  Final      Susceptibility   Klebsiella pneumoniae - MIC*    AMPICILLIN RESISTANT Resistant     CEFAZOLIN <=4 SENSITIVE Sensitive     CEFEPIME <=0.12 SENSITIVE Sensitive     CEFTAZIDIME <=1 SENSITIVE Sensitive     CEFTRIAXONE <=0.25 SENSITIVE Sensitive     CIPROFLOXACIN <=0.25 SENSITIVE Sensitive     GENTAMICIN <=1 SENSITIVE Sensitive     IMIPENEM <=0.25 SENSITIVE Sensitive     TRIMETH/SULFA <=20 SENSITIVE Sensitive     AMPICILLIN/SULBACTAM 4 SENSITIVE Sensitive     PIP/TAZO <=4 SENSITIVE Sensitive     * KLEBSIELLA PNEUMONIAE  Blood culture (routine x 2)     Status: Abnormal   Collection Time: 06/17/20  3:57 AM   Specimen: BLOOD  Result Value Ref Range Status   Specimen Description   Final    BLOOD RIGHT [facility] Performed at [facility], [address] [place], [place] [address]    Special Requests   Final    BOTTLES DRAWN AEROBIC AND ANAEROBIC Blood Culture adequate volume Performed at [facility], [address] [place], [place] [address]    Culture  Setup Time   Final    GRAM NEGATIVE RODS IN BOTH AEROBIC AND ANAEROBIC BOTTLES CRITICAL VALUE NOTED.  VALUE IS CONSISTENT WITH PREVIOUSLY REPORTED AND CALLED VALUE. Performed at [facility], [address], [place],  [place] [address]    Culture (A)  Final    KLEBSIELLA PNEUMONIAE  SUSCEPTIBILITIES PERFORMED ON PREVIOUS CULTURE WITHIN THE LAST 5 DAYS. Performed at [facility], [place] [address], [place], [place] [address]    Report Status 06/19/2020 FINAL  Final  Blood Culture ID Panel (Reflexed)     Status: Abnormal   Collection Time: 06/17/20  3:57 AM  Result Value Ref Range Status   Enterococcus faecalis NOT DETECTED NOT DETECTED Final   Enterococcus Faecium NOT DETECTED NOT DETECTED Final   Listeria monocytogenes NOT DETECTED NOT DETECTED Final   Staphylococcus species NOT DETECTED NOT DETECTED Final   Staphylococcus aureus (BCID) NOT DETECTED NOT DETECTED Final   Staphylococcus epidermidis NOT DETECTED NOT DETECTED Final   Staphylococcus lugdunensis NOT DETECTED NOT DETECTED Final   Streptococcus species NOT DETECTED NOT DETECTED Final   Streptococcus agalactiae NOT DETECTED NOT DETECTED Final   Streptococcus pneumoniae NOT DETECTED NOT DETECTED Final   Streptococcus pyogenes NOT DETECTED NOT DETECTED Final   A.calcoaceticus-baumannii NOT DETECTED NOT DETECTED Final   Bacteroides fragilis NOT DETECTED NOT DETECTED Final   Enterobacterales DETECTED (A) NOT DETECTED Final    Comment: Enterobacterales represent a large order of gram negative bacteria, not a single organism. CRITICAL RESULT CALLED TO, READ BACK BY AND VERIFIED WITH: AMY THOMPSON AT 0093 ON 06/17/20 SNG    Enterobacter cloacae complex NOT DETECTED NOT DETECTED Final   Escherichia coli NOT DETECTED NOT DETECTED Final   Klebsiella aerogenes NOT DETECTED NOT DETECTED Final   Klebsiella oxytoca NOT DETECTED NOT DETECTED Final   Klebsiella pneumoniae DETECTED (A) NOT DETECTED Final    Comment: CRITICAL RESULT CALLED TO, READ BACK BY AND VERIFIED WITH: AMY THOMPSON RN AT 8182 ON 06/17/20 SNG    Proteus species NOT DETECTED NOT DETECTED Final   Salmonella species NOT DETECTED NOT DETECTED Final   Serratia marcescens NOT DETECTED NOT  DETECTED Final   Haemophilus influenzae NOT DETECTED NOT DETECTED Final   Neisseria meningitidis NOT DETECTED NOT DETECTED Final   Pseudomonas aeruginosa NOT DETECTED NOT DETECTED Final   Stenotrophomonas maltophilia NOT DETECTED NOT DETECTED Final   Candida albicans NOT DETECTED NOT DETECTED Final   Candida auris NOT DETECTED NOT DETECTED Final   Candida glabrata NOT DETECTED NOT DETECTED Final   Candida krusei NOT DETECTED NOT DETECTED Final   Candida parapsilosis NOT DETECTED NOT DETECTED Final   Candida tropicalis NOT DETECTED NOT DETECTED Final   Cryptococcus neoformans/gattii NOT DETECTED NOT DETECTED Final   CTX-M ESBL NOT DETECTED NOT DETECTED Final   Carbapenem resistance IMP NOT DETECTED NOT DETECTED Final   Carbapenem resistance KPC NOT DETECTED NOT DETECTED Final   Carbapenem resistance NDM NOT DETECTED NOT DETECTED Final   Carbapenem resist OXA 48 LIKE NOT DETECTED NOT DETECTED Final   Carbapenem resistance VIM NOT DETECTED NOT DETECTED Final    Comment: Performed at [facility], [address] [place], [place] [address]  Urine Culture     Status: Abnormal   Collection Time: 06/17/20 11:01 AM   Specimen: Urine, Random  Result Value Ref Range Status   Specimen Description   Final    URINE, RANDOM Performed at [facility], [address] [place], [place] [address]    Special Requests   Final    NONE Performed at [facility], [address] [place], [place] [address]    Culture >=100,000 COLONIES/mL KLEBSIELLA PNEUMONIAE (A)  Final   Report Status 06/19/2020 FINAL  Final   Organism ID, Bacteria KLEBSIELLA PNEUMONIAE (A)  Final  Susceptibility   Klebsiella pneumoniae - MIC*    AMPICILLIN RESISTANT Resistant     CEFAZOLIN <=4 SENSITIVE Sensitive     CEFTRIAXONE <=0.25 SENSITIVE Sensitive     CIPROFLOXACIN <=0.25 SENSITIVE Sensitive     GENTAMICIN <=1 SENSITIVE Sensitive     IMIPENEM <=0.25 SENSITIVE Sensitive     NITROFURANTOIN  32 SENSITIVE Sensitive     TRIMETH/SULFA <=20 SENSITIVE Sensitive     AMPICILLIN/SULBACTAM 4 SENSITIVE Sensitive     PIP/TAZO <=4 SENSITIVE Sensitive     * >=100,000 COLONIES/mL KLEBSIELLA PNEUMONIAE      Radiology Studies: DG OR UROLOGY CYSTO IMAGE ([facility] ONLY)  Result Date: 06/18/2020 There is no interpretation for this exam.  This order is for images obtained during a surgical procedure.  Please See "Surgeries" Tab for more information regarding the procedure.   ECHOCARDIOGRAM COMPLETE  Result Date: 06/19/2020    ECHOCARDIOGRAM REPORT   Patient Name:   Stephanie [name] Date of Exam: 06/19/2020 Medical Rec #:  [id number]     Height:       66.0 in Accession #:    [id number]    Weight:       177.2 lb Date of Birth:  May 14, 1941      BSA:          1.900 m Patient Age:    79 years      BP:           125/80 mmHg Patient Gender: F             HR:           153 bpm. Exam Location:  [facility] Procedure: 2D Echo, Cardiac Doppler and Color Doppler Indications:     Atrial Fibrillation 427.31 / I48.91  History:         Patient has no prior history of Echocardiogram examinations.                  Risk Factors:Hypertension and Diabetes.  Sonographer:     Alyse Low Roar Referring Phys:  [place] Diagnosing Phys: Kate Sable MD IMPRESSIONS  1. Left ventricular ejection fraction, by estimation, is 50 to 55%. The left ventricle has low normal function. The left ventricle has no regional wall motion abnormalities. There is mild left ventricular hypertrophy. Indeterminate diastolic filling due  to E-A fusion.  2. Right ventricular systolic function is normal. The right ventricular size is normal.  3. The mitral valve is normal in structure. No evidence of mitral valve regurgitation. No evidence of mitral stenosis.  4. The aortic valve is normal in structure. Aortic valve regurgitation is not visualized. No aortic stenosis is present. FINDINGS  Left Ventricle: Left ventricular ejection fraction, by estimation, is 50 to  55%. The left ventricle has low normal function. The left ventricle has no regional wall motion abnormalities. The left ventricular internal cavity size was normal in size. There is mild left ventricular hypertrophy. Indeterminate diastolic filling due to E-A fusion. Right Ventricle: The right ventricular size is normal. No increase in right ventricular wall thickness. Right ventricular systolic function is normal. Left Atrium: Left atrial size was normal in size. Right Atrium: Right atrial size was normal in size. Pericardium: There is no evidence of pericardial effusion. Mitral Valve: The mitral valve is normal in structure. No evidence of mitral valve regurgitation. No evidence of mitral valve stenosis. Tricuspid Valve: The tricuspid valve is grossly normal. Tricuspid valve regurgitation is not demonstrated. No evidence of tricuspid stenosis. Aortic Valve: The  aortic valve is normal in structure. Aortic valve regurgitation is not visualized. No aortic stenosis is present. Aortic valve peak gradient measures 2.8 mmHg. Pulmonic Valve: The pulmonic valve was not well visualized. Pulmonic valve regurgitation is not visualized. No evidence of pulmonic stenosis. Aorta: The aortic root is normal in size and structure. Venous: The inferior vena cava was not well visualized. IAS/Shunts: No atrial level shunt detected by color flow Doppler.  LEFT VENTRICLE PLAX 2D LVIDd:         4.34 cm  Diastology LVIDs:         3.24 cm  LV e' medial:    8.05 cm/s LV PW:         1.11 cm  LV E/e' medial:  12.1 LV IVS:        1.38 cm  LV e' lateral:   10.10 cm/s LVOT diam:     1.80 cm  LV E/e' lateral: 9.6 LVOT Area:     2.54 cm  RIGHT VENTRICLE RV Mid diam:    2.70 cm RV S prime:     19.00 cm/s TAPSE (M-mode): 1.6 cm LEFT ATRIUM           Index       RIGHT ATRIUM           Index LA diam:      3.40 cm 1.79 cm/m  RA Area:     12.60 cm LA Vol (A4C): 34.8 ml 18.32 ml/m RA Volume:   27.80 ml  14.63 ml/m  AORTIC VALVE               PULMONIC  VALVE AV Area (Vmax): 2.66 cm   PV Vmax:        0.96 m/s AV Vmax:        84.20 cm/s PV Peak grad:   3.7 mmHg AV Peak Grad:   2.8 mmHg   RVOT Peak grad: 4 mmHg LVOT Vmax:      88.10 cm/s  AORTA Ao Root diam: 3.30 cm MITRAL VALVE               TRICUSPID VALVE MV Area (PHT): 3.60 cm    TR Peak grad:   17.0 mmHg MV Decel Time: 211 msec    TR Vmax:        206.00 cm/s MV E velocity: 97.30 cm/s                            SHUNTS                            Systemic Diam: 1.80 cm Kate Sable MD Electronically signed by Kate Sable MD Signature Date/Time: 06/19/2020/4:20:09 PM    Final      Scheduled Meds: . acidophilus  1 capsule Oral Daily  . apixaban  5 mg Oral BID  . vitamin C  500 mg Oral Daily  . brimonidine  1 drop Both Eyes BID  . Chlorhexidine Gluconate Cloth  6 each Topical Q0600  . diltiazem  30 mg Oral Q6H  . insulin aspart  0-20 Units Subcutaneous TID AC & HS  . mouth rinse  15 mL Mouth Rinse BID  . metoprolol tartrate  50 mg Oral Q6H  . pantoprazole  40 mg Oral Daily  . rosuvastatin  10 mg Oral Daily  . sodium chloride flush  10-40 mL Intracatheter Q12H  . sodium chloride flush  3 mL Intravenous Q12H  . tamsulosin  0.4 mg Oral Daily  . vitamin B-12  1,000 mcg Oral Daily  . zinc sulfate  220 mg Oral Daily   Continuous Infusions: . sodium chloride 10 mL/hr at 06/20/20 0348  . sodium chloride 100 mL/hr at 06/20/20 1538  . piperacillin-tazobactam (ZOSYN)  IV 3.375 g (06/20/20 1535)  . remdesivir 100 mg in NS 100 mL 100 mg (06/20/20 0918)     LOS: 3 days     Enzo Bi, MD [facility] If 7PM-7AM, please contact night-coverage 06/20/2020, 3:42 PM

## 2020-06-21 DIAGNOSIS — A419 Sepsis, unspecified organism: Secondary | ICD-10-CM

## 2020-06-21 DIAGNOSIS — U071 COVID-19: Secondary | ICD-10-CM

## 2020-06-21 DIAGNOSIS — D696 Thrombocytopenia, unspecified: Secondary | ICD-10-CM

## 2020-06-21 DIAGNOSIS — R6520 Severe sepsis without septic shock: Secondary | ICD-10-CM

## 2020-06-21 LAB — BASIC METABOLIC PANEL
Anion gap: 13 (ref 5–15)
BUN: 43 mg/dL — ABNORMAL HIGH (ref 8–23)
CO2: 16 mmol/L — ABNORMAL LOW (ref 22–32)
Calcium: 6.8 mg/dL — ABNORMAL LOW (ref 8.9–10.3)
Chloride: 99 mmol/L (ref 98–111)
Creatinine, Ser: 3.79 mg/dL — ABNORMAL HIGH (ref 0.44–1.00)
GFR calc Af Amer: 13 mL/min — ABNORMAL LOW (ref >60)
GFR calc non Af Amer: 11 mL/min — ABNORMAL LOW (ref >60)
Glucose, Bld: 158 mg/dL — ABNORMAL HIGH (ref 70–99)
Potassium: 3.2 mmol/L — ABNORMAL LOW (ref 3.5–5.1)
Sodium: 128 mmol/L — ABNORMAL LOW (ref 135–145)

## 2020-06-21 LAB — CBC
HCT: 29.3 % — ABNORMAL LOW (ref 36.0–46.0)
Hemoglobin: 10.2 g/dL — ABNORMAL LOW (ref 12.0–15.0)
MCH: 27.9 pg (ref 26.0–34.0)
MCHC: 34.8 g/dL (ref 30.0–36.0)
MCV: 80.3 fL (ref 80.0–100.0)
Platelets: 93 10*3/uL — ABNORMAL LOW (ref 150–400)
RBC: 3.65 MIL/uL — ABNORMAL LOW (ref 3.87–5.11)
RDW: 15.5 % (ref 11.5–15.5)
WBC: 12.9 10*3/uL — ABNORMAL HIGH (ref 4.0–10.5)
nRBC: 0 % (ref 0.0–0.2)

## 2020-06-21 LAB — MAGNESIUM: Magnesium: 1.7 mg/dL (ref 1.7–2.4)

## 2020-06-21 LAB — C-REACTIVE PROTEIN: CRP: 24 mg/dL — ABNORMAL HIGH (ref <1.0)

## 2020-06-21 LAB — GLUCOSE, CAPILLARY
Glucose-Capillary: 160 mg/dL — ABNORMAL HIGH (ref 70–99)
Glucose-Capillary: 226 mg/dL — ABNORMAL HIGH (ref 70–99)
Glucose-Capillary: 150 mg/dL — ABNORMAL HIGH (ref 70–99)
Glucose-Capillary: 153 mg/dL — ABNORMAL HIGH (ref 70–99)

## 2020-06-21 MED ORDER — HEPARIN (PORCINE) 25000 UT/250ML-% IV SOLN: 1050.0000 [IU]/h | INTRAVENOUS | Status: DC

## 2020-06-21 MED ORDER — POTASSIUM CHLORIDE CRYS ER 20 MEQ PO TBCR
40.0000 meq | EXTENDED_RELEASE_TABLET | Freq: Once | ORAL | Status: AC
  Administered 2020-06-21: 40 meq via ORAL
  Filled 2020-06-21: qty 2

## 2020-06-21 NOTE — TOC Initial Note (Signed)
Transition of Care [facility]) - Initial/Assessment Note    Patient Details  Name: Stephanie [name] MRN: [id number] Date of Birth: Aug 30, 1941  Transition of Care [facility]) CM/SW Contact:    Eileen Stanford, LCSW Phone Number: 06/21/2020, 1:59 PM  Clinical Narrative:   CSW spoke with pt via telephone. Pt is on airborn precautions due to Covid.  Pt states she lives with her spouse and she states she doesn't need any HH because he can help her. Pt states she has a walker and wheelchair at home. Pt states she doesn't need any other equipment. Pt's spouse drives her to appointments. Pt still sees Dr [name] Heck for her PCP. Pt utilizes [facility] for prescriptions. Pt currently has no services in the home and is refusing services after d/c.             Expected Discharge Plan: [place] Barriers to Discharge: Continued Medical Work up   Patient Goals and CMS Choice Patient states their goals for this hospitalization and ongoing recovery are:: to get better   Choice offered to / list presented to : Patient  Expected Discharge Plan and Services Expected Discharge Plan: [place] In-house Referral: NA   Post Acute Care Choice: NA Living arrangements for the past 2 months: Single Family Home                                      Prior Living Arrangements/Services Living arrangements for the past 2 months: Single Family Home Lives with:: Spouse Patient language and need for interpreter reviewed:: Yes Do you feel safe going back to the place where you live?: Yes      Need for Family Participation in Patient Care: Yes (Comment) Care giver support system in place?: Yes (comment)   Criminal Activity/Legal Involvement Pertinent to Current Situation/Hospitalization: No - Comment as needed  Activities of Daily Living Home Assistive Devices/Equipment: Bedside commode/3-in-1, CBG Meter, Hearing aid, Walker (specify type) (roll aid walker with seat) ADL Screening  (condition at time of admission) Patient's cognitive ability adequate to safely complete daily activities?: Yes Is the patient deaf or have difficulty hearing?: Yes Does the patient have difficulty seeing, even when wearing glasses/contacts?: No Does the patient have difficulty concentrating, remembering, or making decisions?: No Patient able to express need for assistance with ADLs?: Yes Does the patient have difficulty dressing or bathing?: No Independently performs ADLs?: Yes (appropriate for developmental age) Does the patient have difficulty walking or climbing stairs?: Yes Weakness of Legs: Left Weakness of Arms/Hands: None  Permission Sought/Granted Permission sought to share information with : Family Supports    Share Information with NAME: jimmie     Permission granted to share info w Relationship: spouse     Emotional Assessment Appearance:: Appears stated age Attitude/Demeanor/Rapport: Engaged Affect (typically observed): Accepting Orientation: : Oriented to Self, Oriented to Place, Oriented to  Time, Oriented to Situation Alcohol / Substance Use: Not Applicable Psych Involvement: No (comment)  Admission diagnosis:  CAP (community acquired pneumonia) [J18.9] AKI (acute kidney injury) ([place]) [N17.9] Paroxysmal SVT (supraventricular tachycardia) (HCC) [I47.1] Sepsis ([place]) [A41.9] Sepsis with acute renal failure without septic shock, due to unspecified organism, unspecified acute renal failure type ([place]) [A41.9, R65.20, N17.9] COVID-19 [U07.1] Patient Active Problem List   Diagnosis Date Noted  . Pressure injury of skin 06/19/2020  . Acquired thrombophilia ([place]) 06/19/2020  . On continuous oral anticoagulation  06/19/2020  . Acute pyelonephritis 06/18/2020  . Hydronephrosis, right 06/18/2020  . Lactic acid acidosis 06/18/2020  . Hyperglycemia 06/18/2020  . Hx of essential hypertension 06/18/2020  . 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 Atrial  fibrillation with rapid ventricular response ([place])   . Essential hypertension   . Pure hypercholesterolemia   . Diabetes mellitus ([place]) 06/17/2020  . Rheumatoid arthritis ([place]) 06/17/2020  . COVID-19 virus infection 06/17/2020  . AKI (acute kidney injury) ([place]) 06/17/2020  . Chronic left SI joint pain 04/19/2020  . 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[place]) 04/19/2020  . Chronic radicular lumbar pain 04/19/2020  . Lumbar spondylosis 04/19/2020  . Chronic pain syndrome 04/19/2020  . Sepsis ([place]) 07/05/2019  . CAP (community acquired pneumonia) 07/05/2019  . Gastroenteritis 07/05/2019  . DM type 2 with diabetic mixed hyperlipidemia ([place]) 04/30/2018  . Elevated serum creatinine 10/16/2017  . Rheumatoid arthritis involving multiple sites with positive rheumatoid factor ([place]) 08/13/2016  . Hyperlipidemia, mixed 04/18/2016  . Gross hematuria 11/01/2015  . History of colon cancer 10/18/2014  . Squamous cell metaplasia of urinary bladder 04/29/2014  . Osteoarthritis of knee 04/01/2014  . B-complex deficiency 03/03/2014  . Benign essential hypertension 01/16/2014  . Unspecified thoracic, thoracolumbar and lumbosacral intervertebral disc disorder 01/16/2014  . Malignant neoplasm of colon ([place]) 01/16/2014  . Bladder neoplasm of uncertain malignant potential 09/14/2013  . Nocturia 08/05/2013  . Scoliosis (and kyphoscoliosis), idiopathic 06/24/2013  . Chronic cystitis 05/04/2013  . Increased frequency of urination 05/04/2013  . Incomplete emptying of bladder 05/04/2013   PCP:  Rusty Aus, MD Pharmacy:   [facility] [phone] - [address], [place] [address] [place] [address] Phone: [phone] Fax: [phone]     Social Determinants of Health (SDOH) Interventions    Readmission Risk Interventions Readmission Risk Prevention Plan 06/21/2020 06/19/2020  Transportation Screening Complete Complete  PCP or Specialist  Appt within 3-5 Days Complete -  HRI or Home Care Consult Complete -  Social Work Consult for [place] Planning/Counseling Complete -  Palliative Care Screening Not Applicable -  Medication Review Press photographer) (No Data) Complete  PCP or Specialist appointment within 3-5 days of discharge - Complete  HRI or [place] - Complete  SW Recovery Care/Counseling Consult - Complete  [place] - Not Applicable  Some recent data might be hidden

## 2020-06-21 NOTE — Consult Note (Signed)
ANTICOAGULATION CONSULT NOTE  Pharmacy Consult for Heparin Infusion Indication: atrial fibrillation  Allergies  Allergen Reactions   Ibuprofen Itching   Indomethacin    Infliximab    Methotrexate Derivatives    Moexipril    Percocet [Oxycodone-Acetaminophen] Itching   Naprosyn [Naproxen] Rash    Patient Measurements: Height: 5\' 6"  (167.6 cm) Weight: 82.2 kg (181 lb 3.2 oz) IBW/kg (Calculated) : 59.3 Heparin Dosing Weight: 75.9 kg  Vital Signs: Temp: 98.9 F (37.2 C) (09/21 1209) Temp Source: Oral (09/21 1209) BP: 101/79 (09/21 1209) Pulse Rate: 85 (09/21 1209)  Labs: Recent Labs    06/18/20 2048 06/19/20 0537 06/19/20 0537 06/19/20 1428 06/20/20 0841 06/21/20 0552  HGB  --  10.5*   < >  --  10.4* 10.2*  HCT  --  31.2*  --   --  29.6* 29.3*  PLT  --  96*  --   --  94* 93*  APTT 50*  --   --   --   --   --   LABPROT 14.4  --   --   --   --   --   INR 1.2  --   --   --   --   --   HEPARINUNFRC  --  0.62  --  0.65  --   --   CREATININE  --  2.28*  --   --  2.93* 3.79*   < > = values in this interval not displayed.    Estimated Creatinine Clearance: 13.4 mL/min (A) (by C-G formula based on SCr of 3.79 mg/dL (H)).   Medications:  Apixaban 5 mg BID (last dose 9/21 AM) -- on hold for unstable renal function  Assessment: Patient is a 79 y/o F admitted with new-onset atrial fibrillation in the setting of K pneumo bacteremia. Patient initially started on anticoagulation with heparin which was converted to apixaban. Now patient with progressively worsening renal function from obstructive uropathy. Apixaban has been discontinued given clearance concerns. Pharmacy has been consulted to initiate heparin infusion.  Baseline aPTT prolonged to 50s, baseline INR 1.2. Unclear significance in the setting of sepsis. H&H, platelets slightly worse since admission but stable. Patient was previously stable on heparin drip at 1050 units/hr.   Goal of Therapy:  aPTT 66-102  seconds Monitor platelets by anticoagulation protocol: Yes   Plan:  --Holding apixaban until renal function stabilizes --Start heparin at 1050 units/hr 9/22 at 1000, no bolus given likely prolonged clearance of apixaban --Will follow aPTT given apixaban interference with anti-Xa level --Daily CBC per protocol  Benita Gutter 06/21/2020,3:47 PM

## 2020-06-21 NOTE — Progress Notes (Signed)
Progress Note  Patient Name: Stephanie [name] Date of Encounter: 06/21/2020  [facility] Cardiologist: Dr. Garen Lah  Subjective   Patient complains of mild shortness of breath he tired lying in the hospital.  She denies chest pain, shortness of breath, and palpitations.  Inpatient Medications    Scheduled Meds: . acidophilus  1 capsule Oral Daily  . apixaban  5 mg Oral BID  . vitamin C  500 mg Oral Daily  . brimonidine  1 drop Both Eyes BID  . Chlorhexidine Gluconate Cloth  6 each Topical Q0600  . diltiazem  30 mg Oral Q6H  . insulin aspart  0-20 Units Subcutaneous TID AC & HS  . mouth rinse  15 mL Mouth Rinse BID  . metoprolol tartrate  50 mg Oral Q6H  . pantoprazole  40 mg Oral Daily  . rosuvastatin  10 mg Oral Daily  . sodium chloride flush  10-40 mL Intracatheter Q12H  . sodium chloride flush  3 mL Intravenous Q12H  . tamsulosin  0.4 mg Oral Daily  . vitamin B-12  1,000 mcg Oral Daily  . zinc sulfate  220 mg Oral Daily   Continuous Infusions: . sodium chloride 250 mL (06/21/20 0340)  . sodium chloride 90 mL/hr at 06/21/20 0340  . piperacillin-tazobactam (ZOSYN)  IV 3.375 g (06/21/20 0342)   PRN Meds: sodium chloride, acetaminophen, guaiFENesin-dextromethorphan, ondansetron (ZOFRAN) IV, sodium chloride flush, sodium chloride flush   Vital Signs    Vitals:   06/21/20 0800 06/21/20 0821 06/21/20 0850 06/21/20 1209  BP: 120/69 111/76  101/79  Pulse: 91 89 87 85  Resp: 18 17 20  (!) 21  Temp:  99.2 F (37.3 C)  98.9 F (37.2 C)  TempSrc:  Oral  Oral  SpO2: 96% 96% 95% 95%  Weight:      Height:        Intake/Output Summary (Last 24 hours) at 06/21/2020 1509 Last data filed at 06/21/2020 1010 Gross per 24 hour  Intake 3207.38 ml  Output 650 ml  Net 2557.38 ml   Last 3 Weights 06/21/2020 06/19/2020 06/18/2020  Weight (lbs) 181 lb 3.2 oz 177 lb 4 oz 176 lb 12.9 oz  Weight (kg) 82.192 kg 80.4 kg 80.2 kg      Telemetry    Atrial fibrillation with  ventricular rates of 80 to 110 bpm. - Personally Reviewed  ECG    No new tracing.  Physical Exam   GEN: No acute distress.   Neck: No JVD Cardiac:  Irregularly irregular without murmurs, rubs, or gallops. Respiratory: C diminished breath sounds throughout with poor inspiratory effort. GI:  Mildly distended.  Nontender. MS: No edema; No deformity. Neuro:  Nonfocal  Psych: Flat affect.  Labs    High Sensitivity Troponin:   Recent Labs  Lab 06/17/20 0357 06/17/20 1101  TROPONINIHS 61* 71*      Chemistry Recent Labs  Lab 06/17/20 0357 06/17/20 1938 06/18/20 0809 06/18/20 0809 06/19/20 0537 06/20/20 0841 06/21/20 0552  NA 133*   < > 135   < > 134* 131* 128*  K 3.9   < > 3.3*   < > 3.6 2.6* 3.2*  CL 102   < > 106   < > 101 99 99  CO2 18*   < > 20*   < > 21* 19* 16*  GLUCOSE 344*   < > 97   < > 140* 155* 158*  BUN 38*   < > 28*   < > 30* 33*  43*  CREATININE 1.57*   < > 1.75*   < > 2.28* 2.93* 3.79*  CALCIUM 8.9   < > 8.1*   < > 7.8* 7.0* 6.8*  PROT 6.2*  --  5.3*  --  5.9*  --   --   ALBUMIN 3.0*  --  2.2*  --  2.3*  --   --   AST 27  --  26  --  30  --   --   ALT 21  --  15  --  15  --   --   ALKPHOS 83  --  84  --  110  --   --   BILITOT 1.0  --  0.6  --  0.5  --   --   GFRNONAA 31*   < > 28*   < > 20* 15* 11*  GFRAA 36*   < > 32*   < > 23* 17* 13*  ANIONGAP 13   < > 9   < > 12 13 13    < > = values in this interval not displayed.     Hematology Recent Labs  Lab 06/19/20 0537 06/20/20 0841 06/21/20 0552  WBC 13.5* 12.4* 12.9*  RBC 3.73* 3.69* 3.65*  HGB 10.5* 10.4* 10.2*  HCT 31.2* 29.6* 29.3*  MCV 83.6 80.2 80.3  MCH 28.2 28.2 27.9  MCHC 33.7 35.1 34.8  RDW 14.6 14.9 15.5  PLT 96* 94* 93*    BNP Recent Labs  Lab 06/18/20 0809  BNP 410.1*     DDimer No results for input(s): DDIMER in the last 168 hours.   Radiology    US RENAL  Result Date: 06/20/2020 CLINICAL DATA:  Acute renal insufficiency. EXAM: RENAL / URINARY TRACT ULTRASOUND  COMPLETE COMPARISON:  None. FINDINGS: Right Kidney: Renal measurements: 13.8 cm x 6.4 cm x 7.6 cm = volume: 350 mL. Diffusely increased echogenicity of the renal parenchyma is seen. No mass or hydronephrosis visualized. Left Kidney: Renal measurements: 12.0 cm x 5.5 cm x 5.4 cm = volume: 188 mL. Diffusely increased echogenicity of the renal parenchyma is seen. No mass or hydronephrosis visualized. Bladder: A Foley catheter is in place. Other: None. IMPRESSION: 1. Diffusely increased echogenicity of both kidneys, which may be secondary to medical renal disease. Electronically Signed   By: Virgina Norfolk M.D.   On: 06/20/2020 19:33   ECHOCARDIOGRAM COMPLETE  Result Date: 06/19/2020    ECHOCARDIOGRAM REPORT   Patient Name:   Stephanie [name] Date of Exam: 06/19/2020 Medical Rec #:  [id number]     Height:       66.0 in Accession #:    [id number]    Weight:       177.2 lb Date of Birth:  March 07, 1941      BSA:          1.900 m Patient Age:    79 years      BP:           125/80 mmHg Patient Gender: F             HR:           153 bpm. Exam Location:  [facility] Procedure: 2D Echo, Cardiac Doppler and Color Doppler Indications:     Atrial Fibrillation 427.31 / I48.91  History:         Patient has no prior history of Echocardiogram examinations.  Risk Factors:Hypertension and Diabetes.  Sonographer:     Alyse Low Roar Referring Phys:  [place] Diagnosing Phys: Kate Sable MD IMPRESSIONS  1. Left ventricular ejection fraction, by estimation, is 50 to 55%. The left ventricle has low normal function. The left ventricle has no regional wall motion abnormalities. There is mild left ventricular hypertrophy. Indeterminate diastolic filling due  to E-A fusion.  2. Right ventricular systolic function is normal. The right ventricular size is normal.  3. The mitral valve is normal in structure. No evidence of mitral valve regurgitation. No evidence of mitral stenosis.  4. The aortic valve is normal in structure.  Aortic valve regurgitation is not visualized. No aortic stenosis is present. FINDINGS  Left Ventricle: Left ventricular ejection fraction, by estimation, is 50 to 55%. The left ventricle has low normal function. The left ventricle has no regional wall motion abnormalities. The left ventricular internal cavity size was normal in size. There is mild left ventricular hypertrophy. Indeterminate diastolic filling due to E-A fusion. Right Ventricle: The right ventricular size is normal. No increase in right ventricular wall thickness. Right ventricular systolic function is normal. Left Atrium: Left atrial size was normal in size. Right Atrium: Right atrial size was normal in size. Pericardium: There is no evidence of pericardial effusion. Mitral Valve: The mitral valve is normal in structure. No evidence of mitral valve regurgitation. No evidence of mitral valve stenosis. Tricuspid Valve: The tricuspid valve is grossly normal. Tricuspid valve regurgitation is not demonstrated. No evidence of tricuspid stenosis. Aortic Valve: The aortic valve is normal in structure. Aortic valve regurgitation is not visualized. No aortic stenosis is present. Aortic valve peak gradient measures 2.8 mmHg. Pulmonic Valve: The pulmonic valve was not well visualized. Pulmonic valve regurgitation is not visualized. No evidence of pulmonic stenosis. Aorta: The aortic root is normal in size and structure. Venous: The inferior vena cava was not well visualized. IAS/Shunts: No atrial level shunt detected by color flow Doppler.  LEFT VENTRICLE PLAX 2D LVIDd:         4.34 cm  Diastology LVIDs:         3.24 cm  LV e' medial:    8.05 cm/s LV PW:         1.11 cm  LV E/e' medial:  12.1 LV IVS:        1.38 cm  LV e' lateral:   10.10 cm/s LVOT diam:     1.80 cm  LV E/e' lateral: 9.6 LVOT Area:     2.54 cm  RIGHT VENTRICLE RV Mid diam:    2.70 cm RV S prime:     19.00 cm/s TAPSE (M-mode): 1.6 cm LEFT ATRIUM           Index       RIGHT ATRIUM           Index  LA diam:      3.40 cm 1.79 cm/m  RA Area:     12.60 cm LA Vol (A4C): 34.8 ml 18.32 ml/m RA Volume:   27.80 ml  14.63 ml/m  AORTIC VALVE               PULMONIC VALVE AV Area (Vmax): 2.66 cm   PV Vmax:        0.96 m/s AV Vmax:        84.20 cm/s PV Peak grad:   3.7 mmHg AV Peak Grad:   2.8 mmHg   RVOT Peak grad: 4 mmHg LVOT Vmax:  88.10 cm/s  AORTA Ao Root diam: 3.30 cm MITRAL VALVE               TRICUSPID VALVE MV Area (PHT): 3.60 cm    TR Peak grad:   17.0 mmHg MV Decel Time: 211 msec    TR Vmax:        206.00 cm/s MV E velocity: 97.30 cm/s                            SHUNTS                            Systemic Diam: 1.80 cm Kate Sable MD Electronically signed by Kate Sable MD Signature Date/Time: 06/19/2020/4:20:09 PM    Final     Cardiac Studies   TTE (06/19/2020): 1. Left ventricular ejection fraction, by estimation, is 50 to 55%. The  left ventricle has low normal function. The left ventricle has no regional  wall motion abnormalities. There is mild left ventricular hypertrophy.  Indeterminate diastolic filling due  to E-A fusion.  2. Right ventricular systolic function is normal. The right ventricular  size is normal.  3. The mitral valve is normal in structure. No evidence of mitral valve  regurgitation. No evidence of mitral stenosis.  4. The aortic valve is normal in structure. Aortic valve regurgitation is  not visualized. No aortic stenosis is present.   Patient Profile     79 y.o. female with history of hypertension, diabetes, hyperlipidemia, rheumatoid arthritis, chronic urinary retention requiring self-catheterization presenting with fevers chills, diagnosed with sepsis likely from urinary origin, underwent right renal stent placement due to obstruction. Patient being seen for new onset atrial fibrillation with rapid ventricular response in the setting of sepsis secondary to right pyelonephritis and COVID-19 infection.   Assessment & Plan    Atrial  fibrillation with rapid ventricular response: Ventricular rates adequately controlled on current combination of metoprolol and diltiazem.  Patient currently on apixaban 5 mg twice daily, which is somewhat precarious in the setting of acute kidney injury with continually rising creatinine.  Continue current doses of metoprolol and diltiazem.  Consider consolidating to once or twice daily dosing tomorrow if reasonable ventricular rate control continues.  In the setting of AKI, I recommend discontinuation of apixaban and initiation of IV heparin tomorrow morning.  NOAC can be restarted once renal function has stabilized.  Pyelonephritis: No flank pain reported.  Continue antibiotics per internal medicine.  Management of ureteral stent per urology.  COVID-19 infection:  Per internal medicine.  Acute kidney injury: Likely multifactorial including ureteral obstruction and IV contrast exposure.  Avoid nephrotoxic drugs.  Transition from apixaban to IV heparin, as outlined above.  Appreciate nephrology recommendations.  Thrombocytopenia: Platelets below baseline but relatively stable.  Suspect platelets decreased in the setting of acute infectious illnesses.  Continue to monitor closely in the setting of anticoagulation.    For questions or updates, please contact [place] Please consult [email or web address] for contact info under [facility].     Signed, Nelva Bush, MD  06/21/2020, 3:09 PM

## 2020-06-21 NOTE — Progress Notes (Signed)
PROGRESS NOTE    Stephanie [name]  ZOX:[id number] DOB: 08/16/1941 DOA: 06/17/2020 PCP: Rusty Aus, MD    Assessment & Plan:   Principal Problem:   Sepsis [facility]) Active Problems:   Benign essential hypertension   Chronic cystitis   Rheumatoid arthritis involving multiple sites with positive rheumatoid factor ([place])   Diabetes mellitus ([name])   Rheumatoid arthritis ([place])   COVID-19 virus infection   AKI (acute kidney injury) ([name])   Atrial fibrillation with rapid ventricular response ([name])   Essential hypertension   Pure hypercholesterolemia   Acute pyelonephritis   Hydronephrosis, right   Lactic acid acidosis   Hyperglycemia   Hx of essential hypertension   Immunosuppression due to drug therapy   Pressure injury of skin   Acquired thrombophilia ([name])   On continuous oral anticoagulation    Stephanie [name] is a 79 y.o. female with medical history significant for COVID-19 viral infection (positive PCR test June 09, 2020), history of diabetes mellitus, history of rheumatoid arthritis on chronic immunosuppressive therapy, hypertension and a remote history of colon cancer who was brought into the ER by EMS for evaluation of fever.  Patient had a T-max of 104F at home.  She complains of generalized weakness and worsening shortness of breath but denies having any nausea, no vomiting, no chest pain or abdominal pain.  Patient does self caths at home. Patient is fully vaccinated She was started on prednisone by her primary care provider for presumed COVID-19 bronchitis   # Sepsis 2/2 Klebsiella bacteremia and  # right pyelonephritis  --As evidenced by fever with a T-max of 103F, tachycardia, tachypnea, leukocytosis of 20,000 with a left shift and lactic acid of 3.1.  UA positive for infection.  Pt started on ceftriaxone, then broadened to zosyn on 9/19 --CT renal showed "Debris present dependently within the right renal collecting system and bladder"   --Pt received 2L LR f/b  MIVF --Urine grew pan-sensitive Klebsiella. --blood cx 2/2 pos for Klebsiella PLAN:  --repeat blood cx x2 today --continue zosyn until blood cx neg x2 days then will de-escalate  # Severe right hydronephrosis --CT renal showed "Progressive right UPJ obstruction compared to prior October of 2020." --right stent placement by urology 9/18 (found frank pus in the kidney) --US renal 9/20 showed resolved hydro PLAN: --continue Foley  --continue MIVF@100   --strict I/O  # New onset Afib w RVR, persistent # Acquired thrombophilia  --developed while in the ED.  HR 120's-140's despite multiple doses of IV dilt, IV metop, dilt gtt@15   --started heparin gtt 9/18, transitioned to Eliquis  --It appeared that her right hand IV was infiltrated and all the IV dilt, IV metop and IV amiodarone may have not gotten into her system. --cardiology consulted PLAN: --continue oral dilt 30 mg q6h --continue lopressor 50 mg q6h --continue eliquis --treat sepsis  # COVID-19 infection --Pt is fully vaccinated.  Patient's COVID-19 PCR test was positive on 06/09/20.  No overt respiratory symptoms.  CT chest clear.  No real O2 requirement on presentation. --Remdesivir started on admission PLAN: --continue Remdesivir --monitor O2 sats  AKI, worsening --on admission creatinine is 1.5.  Baseline Cr 0.8.  AKI due to urinary obstruction and infection, and also contrast. --Cr worsened to 3.79 this morning --US renal showed resolved hydro and medical renal disease PLAN: --continue MIVF@100  --nephrology consult today  Lactic acidosis 2/2 sepsis --resolved with IVF.  Hx Diabetes mellitus  hyperglycemia related to systemic steroid administration Patient was started on prednisone as an  outpatient by her primary care provider for presumed COVID-19 bronchitis Chest x-ray is negative for pneumonia and patient is not hypoxic Prednisone d/c'ed. PLAN: --SSI TID  Hx of HTN --BP drops sometimes from a lot of rate  control agents --BP wnl now --continue to hold home amlodipine  History of rheumatoid arthritis --continue to hold Arava due to sepsis  Hypokalemia, not POA --monitor and replete PRN   DVT prophylaxis: Lovenox SQ Code Status: DNR  Family Communication: husband updated on the phone today 06/21/20  Status is: inpatient Dispo:   The patient is from: home Anticipated d/c is to: home Anticipated d/c date is: >3 days Patient currently is not medically stable to d/c due to: Severe sepsis, bacteremia, right hydro and pyelo requiring IV abx    Subjective and Interval History:  HR controlled this morning.  Pt was more alert, and doing better.     Objective: Vitals:   06/21/20 0800 06/21/20 0821 06/21/20 0850 06/21/20 1209  BP: 120/69 111/76  101/79  Pulse: 91 89 87 85  Resp: 18 17 20  (!) 21  Temp:  99.2 F (37.3 C)  98.9 F (37.2 C)  TempSrc:  Oral  Oral  SpO2: 96% 96% 95% 95%  Weight:      Height:        Intake/Output Summary (Last 24 hours) at 06/21/2020 1525 Last data filed at 06/21/2020 1010 Gross per 24 hour  Intake 3207.38 ml  Output 650 ml  Net 2557.38 ml   Filed Weights   06/18/20 0625 06/19/20 0625 06/21/20 0125  Weight: 80.2 kg 80.4 kg 82.2 kg    Examination:   Constitutional: NAD, more alert, oriented HEENT: conjunctivae and lids normal, EOMI CV: irregular. Distal pulses +2.  No cyanosis.   RESP: normal respiratory effort, on 2L GI: +BS, NTND Extremities: No effusions, edema in BLE.  Right hand edematous SKIN: warm, dry and intact Neuro: II - XII grossly intact.  Sensation intact Psych: Normal mood and affect.    Foley present, draining urine with some debris sediments.    Data Reviewed: I have personally reviewed following labs and imaging studies  CBC: Recent Labs  Lab 06/17/20 0357 06/18/20 0809 06/19/20 0537 06/20/20 0841 06/21/20 0552  WBC 20.4* 13.7* 13.5* 12.4* 12.9*  NEUTROABS 17.8* 10.7* 11.8*  --   --   HGB 11.8* 11.1* 10.5*  10.4* 10.2*  HCT 34.2* 31.5* 31.2* 29.6* 29.3*  MCV 82.4 80.6 83.6 80.2 80.3  PLT 144* 97* 96* 94* 93*   Basic Metabolic Panel: Recent Labs  Lab 06/17/20 0357 06/17/20 1935 06/17/20 1938 06/18/20 0809 06/19/20 0537 06/20/20 0841 06/21/20 0552  NA   < >  --  135 135 134* 131* 128*  K   < >  --  3.3* 3.3* 3.6 2.6* 3.2*  CL   < >  --  102 106 101 99 99  CO2   < >  --  20* 20* 21* 19* 16*  GLUCOSE   < >  --  203* 97 140* 155* 158*  BUN   < >  --  28* 28* 30* 33* 43*  CREATININE   < >  --  1.37* 1.75* 2.28* 2.93* 3.79*  CALCIUM   < >  --  8.4* 8.1* 7.8* 7.0* 6.8*  MG  --  1.8  --  1.8  --  1.7 1.7  PHOS  --   --   --  2.9  --   --   --    < > =  values in this interval not displayed.   GFR: Estimated Creatinine Clearance: 13.4 mL/min (A) (by C-G formula based on SCr of 3.79 mg/dL (H)). Liver Function Tests: Recent Labs  Lab 06/17/20 0357 06/18/20 0809 06/19/20 0537  AST 27 26 30   ALT 21 15 15   ALKPHOS 83 84 110  BILITOT 1.0 0.6 0.5  PROT 6.2* 5.3* 5.9*  ALBUMIN 3.0* 2.2* 2.3*   No results for input(s): LIPASE, AMYLASE in the last 168 hours. No results for input(s): AMMONIA in the last 168 hours. Coagulation Profile: Recent Labs  Lab 06/18/20 2048  INR 1.2   Cardiac Enzymes: No results for input(s): CKTOTAL, CKMB, CKMBINDEX, TROPONINI in the last 168 hours. BNP (last 3 results) No results for input(s): PROBNP in the last 8760 hours. HbA1C: No results for input(s): HGBA1C in the last 72 hours. CBG: Recent Labs  Lab 06/20/20 1203 06/20/20 1702 06/20/20 2123 06/21/20 0824 06/21/20 1208  GLUCAP 170* 172* 152* 160* 226*   Lipid Profile: No results for input(s): CHOL, HDL, LDLCALC, TRIG, CHOLHDL, LDLDIRECT in the last 72 hours. Thyroid Function Tests: No results for input(s): TSH, T4TOTAL, FREET4, T3FREE, THYROIDAB in the last 72 hours. Anemia Panel: Recent Labs    06/19/20 0537  FERRITIN 472*   Sepsis Labs: Recent Labs  Lab 06/17/20 0357  06/17/20 1101 06/17/20 1935 06/17/20 2217  PROCALCITON 17.78  --   --   --   LATICACIDVEN 3.1* 2.5* 2.8* 1.6    Recent Results (from the past 240 hour(s))  Blood culture (routine x 2)     Status: Abnormal   Collection Time: 06/17/20  3:57 AM   Specimen: BLOOD  Result Value Ref Range Status   Specimen Description   Final    BLOOD LEFT HAND Performed at [facility], [address], [place], [place] [address]    Special Requests   Final    BOTTLES DRAWN AEROBIC AND ANAEROBIC Blood Culture adequate volume Performed at [facility], [address] [place], [place] [address]    Culture  Setup Time   Final    Organism ID to follow GRAM NEGATIVE RODS IN BOTH AEROBIC AND ANAEROBIC BOTTLES CRITICAL RESULT CALLED TO, READ BACK BY AND VERIFIED WITH: AMY THOMPSON AT 1534 ON 06/17/20 SNG Performed at [facility], [address] [place], [place] [address]    Culture KLEBSIELLA PNEUMONIAE (A)  Final   Report Status 06/19/2020 FINAL  Final   Organism ID, Bacteria KLEBSIELLA PNEUMONIAE  Final      Susceptibility   Klebsiella pneumoniae - MIC*    AMPICILLIN RESISTANT Resistant     CEFAZOLIN <=4 SENSITIVE Sensitive     CEFEPIME <=0.12 SENSITIVE Sensitive     CEFTAZIDIME <=1 SENSITIVE Sensitive     CEFTRIAXONE <=0.25 SENSITIVE Sensitive     CIPROFLOXACIN <=0.25 SENSITIVE Sensitive     GENTAMICIN <=1 SENSITIVE Sensitive     IMIPENEM <=0.25 SENSITIVE Sensitive     TRIMETH/SULFA <=20 SENSITIVE Sensitive     AMPICILLIN/SULBACTAM 4 SENSITIVE Sensitive     PIP/TAZO <=4 SENSITIVE Sensitive     * KLEBSIELLA PNEUMONIAE  Blood culture (routine x 2)     Status: Abnormal   Collection Time: 06/17/20  3:57 AM   Specimen: BLOOD  Result Value Ref Range Status   Specimen Description   Final    BLOOD RIGHT [facility] Performed at [facility], [address], [place],  [address]    Special Requests   Final    BOTTLES DRAWN  AEROBIC AND ANAEROBIC Blood Culture  adequate volume Performed at [facility], [address] [place], [place] [address]    Culture  Setup Time   Final    GRAM NEGATIVE RODS IN BOTH AEROBIC AND ANAEROBIC BOTTLES CRITICAL VALUE NOTED.  VALUE IS CONSISTENT WITH PREVIOUSLY REPORTED AND CALLED VALUE. Performed at [facility], [address] [place], [place] [address]    Culture (A)  Final    KLEBSIELLA PNEUMONIAE SUSCEPTIBILITIES PERFORMED ON PREVIOUS CULTURE WITHIN THE LAST 5 DAYS. Performed at [facility], [place] [address], [place], [place] [address]    Report Status 06/19/2020 FINAL  Final  Blood Culture ID Panel (Reflexed)     Status: Abnormal   Collection Time: 06/17/20  3:57 AM  Result Value Ref Range Status   Enterococcus faecalis NOT DETECTED NOT DETECTED Final   Enterococcus Faecium NOT DETECTED NOT DETECTED Final   Listeria monocytogenes NOT DETECTED NOT DETECTED Final   Staphylococcus species NOT DETECTED NOT DETECTED Final   Staphylococcus aureus (BCID) NOT DETECTED NOT DETECTED Final   Staphylococcus epidermidis NOT DETECTED NOT DETECTED Final   Staphylococcus lugdunensis NOT DETECTED NOT DETECTED Final   Streptococcus species NOT DETECTED NOT DETECTED Final   Streptococcus agalactiae NOT DETECTED NOT DETECTED Final   Streptococcus pneumoniae NOT DETECTED NOT DETECTED Final   Streptococcus pyogenes NOT DETECTED NOT DETECTED Final   A.calcoaceticus-baumannii NOT DETECTED NOT DETECTED Final   Bacteroides fragilis NOT DETECTED NOT DETECTED Final   Enterobacterales DETECTED (A) NOT DETECTED Final    Comment: Enterobacterales represent a large order of gram negative bacteria, not a single organism. CRITICAL RESULT CALLED TO, READ BACK BY AND VERIFIED WITH: AMY THOMPSON AT 1540 ON 06/17/20 SNG    Enterobacter cloacae complex NOT DETECTED NOT DETECTED Final   Escherichia coli NOT DETECTED NOT DETECTED Final   Klebsiella aerogenes NOT DETECTED NOT DETECTED Final   Klebsiella oxytoca  NOT DETECTED NOT DETECTED Final   Klebsiella pneumoniae DETECTED (A) NOT DETECTED Final    Comment: CRITICAL RESULT CALLED TO, READ BACK BY AND VERIFIED WITH: AMY THOMPSON RN AT 0867 ON 06/17/20 SNG    Proteus species NOT DETECTED NOT DETECTED Final   Salmonella species NOT DETECTED NOT DETECTED Final   Serratia marcescens NOT DETECTED NOT DETECTED Final   Haemophilus influenzae NOT DETECTED NOT DETECTED Final   Neisseria meningitidis NOT DETECTED NOT DETECTED Final   Pseudomonas aeruginosa NOT DETECTED NOT DETECTED Final   Stenotrophomonas maltophilia NOT DETECTED NOT DETECTED Final   Candida albicans NOT DETECTED NOT DETECTED Final   Candida auris NOT DETECTED NOT DETECTED Final   Candida glabrata NOT DETECTED NOT DETECTED Final   Candida krusei NOT DETECTED NOT DETECTED Final   Candida parapsilosis NOT DETECTED NOT DETECTED Final   Candida tropicalis NOT DETECTED NOT DETECTED Final   Cryptococcus neoformans/gattii NOT DETECTED NOT DETECTED Final   CTX-M ESBL NOT DETECTED NOT DETECTED Final   Carbapenem resistance IMP NOT DETECTED NOT DETECTED Final   Carbapenem resistance KPC NOT DETECTED NOT DETECTED Final   Carbapenem resistance NDM NOT DETECTED NOT DETECTED Final   Carbapenem resist OXA 48 LIKE NOT DETECTED NOT DETECTED Final   Carbapenem resistance VIM NOT DETECTED NOT DETECTED Final    Comment: Performed at [facility], [address], [place], [place] [address]  Urine Culture     Status: Abnormal   Collection Time: 06/17/20 11:01 AM   Specimen: Urine, Random  Result Value Ref Range Status   Specimen  Description   Final    URINE, RANDOM Performed at [facility], [address] [place], [place] [address]    Special Requests   Final    NONE Performed at [facility], [place], [place] [address]    Culture >=100,000 COLONIES/mL KLEBSIELLA PNEUMONIAE (A)  Final   Report Status 06/19/2020 FINAL  Final   Organism ID, Bacteria  KLEBSIELLA PNEUMONIAE (A)  Final      Susceptibility   Klebsiella pneumoniae - MIC*    AMPICILLIN RESISTANT Resistant     CEFAZOLIN <=4 SENSITIVE Sensitive     CEFTRIAXONE <=0.25 SENSITIVE Sensitive     CIPROFLOXACIN <=0.25 SENSITIVE Sensitive     GENTAMICIN <=1 SENSITIVE Sensitive     IMIPENEM <=0.25 SENSITIVE Sensitive     NITROFURANTOIN 32 SENSITIVE Sensitive     TRIMETH/SULFA <=20 SENSITIVE Sensitive     AMPICILLIN/SULBACTAM 4 SENSITIVE Sensitive     PIP/TAZO <=4 SENSITIVE Sensitive     * >=100,000 COLONIES/mL KLEBSIELLA PNEUMONIAE      Radiology Studies: US RENAL  Result Date: 06/20/2020 CLINICAL DATA:  Acute renal insufficiency. EXAM: RENAL / URINARY TRACT ULTRASOUND COMPLETE COMPARISON:  None. FINDINGS: Right Kidney: Renal measurements: 13.8 cm x 6.4 cm x 7.6 cm = volume: 350 mL. Diffusely increased echogenicity of the renal parenchyma is seen. No mass or hydronephrosis visualized. Left Kidney: Renal measurements: 12.0 cm x 5.5 cm x 5.4 cm = volume: 188 mL. Diffusely increased echogenicity of the renal parenchyma is seen. No mass or hydronephrosis visualized. Bladder: A Foley catheter is in place. Other: None. IMPRESSION: 1. Diffusely increased echogenicity of both kidneys, which may be secondary to medical renal disease. Electronically Signed   By: Virgina Norfolk M.D.   On: 06/20/2020 19:33   ECHOCARDIOGRAM COMPLETE  Result Date: 06/19/2020    ECHOCARDIOGRAM REPORT   Patient Name:   Stephanie [name] Date of Exam: 06/19/2020 Medical Rec #:  [id number]     Height:       66.0 in Accession #:    [id number]    Weight:       177.2 lb Date of Birth:  21-Jan-1941      BSA:          1.900 m Patient Age:    40 years      BP:           125/80 mmHg Patient Gender: F             HR:           153 bpm. Exam Location:  [facility] Procedure: 2D Echo, Cardiac Doppler and Color Doppler Indications:     Atrial Fibrillation 427.31 / I48.91  History:         Patient has no prior history of Echocardiogram  examinations.                  Risk Factors:Hypertension and Diabetes.  Sonographer:     Alyse Low Roar Referring Phys:  [place] Diagnosing Phys: Kate Sable MD IMPRESSIONS  1. Left ventricular ejection fraction, by estimation, is 50 to 55%. The left ventricle has low normal function. The left ventricle has no regional wall motion abnormalities. There is mild left ventricular hypertrophy. Indeterminate diastolic filling due  to E-A fusion.  2. Right ventricular systolic function is normal. The right ventricular size is normal.  3. The mitral valve is normal in structure. No evidence of mitral valve regurgitation. No evidence of mitral stenosis.  4.  The aortic valve is normal in structure. Aortic valve regurgitation is not visualized. No aortic stenosis is present. FINDINGS  Left Ventricle: Left ventricular ejection fraction, by estimation, is 50 to 55%. The left ventricle has low normal function. The left ventricle has no regional wall motion abnormalities. The left ventricular internal cavity size was normal in size. There is mild left ventricular hypertrophy. Indeterminate diastolic filling due to E-A fusion. Right Ventricle: The right ventricular size is normal. No increase in right ventricular wall thickness. Right ventricular systolic function is normal. Left Atrium: Left atrial size was normal in size. Right Atrium: Right atrial size was normal in size. Pericardium: There is no evidence of pericardial effusion. Mitral Valve: The mitral valve is normal in structure. No evidence of mitral valve regurgitation. No evidence of mitral valve stenosis. Tricuspid Valve: The tricuspid valve is grossly normal. Tricuspid valve regurgitation is not demonstrated. No evidence of tricuspid stenosis. Aortic Valve: The aortic valve is normal in structure. Aortic valve regurgitation is not visualized. No aortic stenosis is present. Aortic valve peak gradient measures 2.8 mmHg. Pulmonic Valve: The pulmonic valve was  not well visualized. Pulmonic valve regurgitation is not visualized. No evidence of pulmonic stenosis. Aorta: The aortic root is normal in size and structure. Venous: The inferior vena cava was not well visualized. IAS/Shunts: No atrial level shunt detected by color flow Doppler.  LEFT VENTRICLE PLAX 2D LVIDd:         4.34 cm  Diastology LVIDs:         3.24 cm  LV e' medial:    8.05 cm/s LV PW:         1.11 cm  LV E/e' medial:  12.1 LV IVS:        1.38 cm  LV e' lateral:   10.10 cm/s LVOT diam:     1.80 cm  LV E/e' lateral: 9.6 LVOT Area:     2.54 cm  RIGHT VENTRICLE RV Mid diam:    2.70 cm RV S prime:     19.00 cm/s TAPSE (M-mode): 1.6 cm LEFT ATRIUM           Index       RIGHT ATRIUM           Index LA diam:      3.40 cm 1.79 cm/m  RA Area:     12.60 cm LA Vol (A4C): 34.8 ml 18.32 ml/m RA Volume:   27.80 ml  14.63 ml/m  AORTIC VALVE               PULMONIC VALVE AV Area (Vmax): 2.66 cm   PV Vmax:        0.96 m/s AV Vmax:        84.20 cm/s PV Peak grad:   3.7 mmHg AV Peak Grad:   2.8 mmHg   RVOT Peak grad: 4 mmHg LVOT Vmax:      88.10 cm/s  AORTA Ao Root diam: 3.30 cm MITRAL VALVE               TRICUSPID VALVE MV Area (PHT): 3.60 cm    TR Peak grad:   17.0 mmHg MV Decel Time: 211 msec    TR Vmax:        206.00 cm/s MV E velocity: 97.30 cm/s                            SHUNTS  Systemic Diam: 1.80 cm Kate Sable MD Electronically signed by Kate Sable MD Signature Date/Time: 06/19/2020/4:20:09 PM    Final      Scheduled Meds: . acidophilus  1 capsule Oral Daily  . vitamin C  500 mg Oral Daily  . brimonidine  1 drop Both Eyes BID  . Chlorhexidine Gluconate Cloth  6 each Topical Q0600  . diltiazem  30 mg Oral Q6H  . insulin aspart  0-20 Units Subcutaneous TID AC & HS  . mouth rinse  15 mL Mouth Rinse BID  . metoprolol tartrate  50 mg Oral Q6H  . pantoprazole  40 mg Oral Daily  . rosuvastatin  10 mg Oral Daily  . sodium chloride flush  10-40 mL Intracatheter Q12H    . sodium chloride flush  3 mL Intravenous Q12H  . tamsulosin  0.4 mg Oral Daily  . vitamin B-12  1,000 mcg Oral Daily  . zinc sulfate  220 mg Oral Daily   Continuous Infusions: . sodium chloride 250 mL (06/21/20 0340)  . sodium chloride 90 mL/hr at 06/21/20 0340  . piperacillin-tazobactam (ZOSYN)  IV 3.375 g (06/21/20 0342)     LOS: 4 days     Enzo Bi, MD [facility] If 7PM-7AM, please contact night-coverage 06/21/2020, 3:25 PM

## 2020-06-21 NOTE — Consult Note (Signed)
[facility]  CONSULT NOTE    Date: 06/21/2020                  Patient Name:  Stephanie [name]  MRN: [id number]  DOB: 1941-07-08  Age / Sex: 79 y.o., female         PCP: Rusty Aus, MD                 Service Requesting Consult: Dr. Billie Ruddy                 Reason for Consult: Acute renal failure            History of Present Illness: Stephanie [name] admitted to [facility] for COVID pneumonia. Patient found to have klebsiella sepsis and had right sided pyelonephritis. She underwent right ureteral stent placed on on 9/18.  Patient had IV contrast exposure on 9/17.  Nephrology consulted for worsening kidney function.    Medications: Outpatient medications: Medications Prior to Admission  Medication Sig Dispense Refill Last Dose  . amLODipine (NORVASC) 10 MG tablet Take 5 mg by mouth 2 (two) times daily.    Past Month at Unknown time  . brimonidine (ALPHAGAN) 0.2 % ophthalmic solution Place 1 drop into both eyes 2 (two) times daily.   Past Month at Unknown time  . Cranberry 400 MG CAPS Take by mouth.   Past Month at Unknown time  . estradiol (ESTRACE) 0.1 MG/GM vaginal cream Place 1 Applicatorful vaginally at bedtime.   Past Month at Unknown time  . glipiZIDE (GLUCOTROL) 10 MG tablet Take 10 mg by mouth daily before breakfast.   Past Month at Unknown time  . hydrochlorothiazide (HYDRODIURIL) 25 MG tablet Take 25 mg by mouth daily.   Past Month at Unknown time  . leflunomide (ARAVA) 10 MG tablet Take 10 mg by mouth daily.   Past Month at Unknown time  . metFORMIN (GLUCOPHAGE-XR) 500 MG 24 hr tablet Take 500 mg by mouth 2 (two) times daily.   Past Month at Unknown time  . metoprolol tartrate (LOPRESSOR) 50 MG tablet Take 50 mg by mouth 2 (two) times daily.   Past Month at Unknown time  . omeprazole (PRILOSEC) 40 MG capsule Take 40 mg by mouth daily.   Past Month at Unknown time  . potassium chloride SA (K-DUR,KLOR-CON) 20 MEQ tablet Take 20 mEq by mouth daily.   Past  Month at Unknown time  . [EXPIRED] predniSONE (DELTASONE) 20 MG tablet Take 40 mg by mouth daily with breakfast.   Past Week at Unknown time  . Probiotic Product (PROBIOTIC-10 PO) Take 1 Dose by mouth daily.    Past Month at Unknown time  . rosuvastatin (CRESTOR) 10 MG tablet Take by mouth.   Past Month at Unknown time  . tacrolimus (PROTOPIC) 0.1 % ointment Apply 1 application topically 2 (two) times daily as needed (rash).    prn at prn  . tamsulosin (FLOMAX) 0.4 MG CAPS capsule TAKE 1 CAPSULE(0.4 MG) BY MOUTH DAILY (Patient taking differently: Take 0.4 mg by mouth daily. ) 90 capsule 0 Past Month at Unknown time  . vitamin B-12 (CYANOCOBALAMIN) 1000 MCG tablet Take 1,000 mcg by mouth daily.   Past Month at Unknown time  . CONTOUR TEST test strip 1 each by Other route daily.   5     Current medications: Current Facility-Administered Medications  Medication Dose Route Frequency Provider Last Rate Last Admin  . 0.9 %  sodium chloride  infusion  250 mL Intravenous PRN Irine Seal, MD 10 mL/hr at 06/21/20 0340 250 mL at 06/21/20 0340  . 0.9 %  sodium chloride infusion   Intravenous Continuous Enzo Bi, MD 90 mL/hr at 06/21/20 0340 New Bag at 06/21/20 0340  . acetaminophen (TYLENOL) tablet 650 mg  650 mg Oral Q6H PRN Irine Seal, MD   650 mg at 06/21/20 0426  . acidophilus (RISAQUAD) capsule 1 capsule  1 capsule Oral Daily Irine Seal, MD   1 capsule at 06/21/20 0856  . apixaban (ELIQUIS) tablet 5 mg  5 mg Oral BID Enzo Bi, MD   5 mg at 06/21/20 0856  . ascorbic acid (VITAMIN C) tablet 500 mg  500 mg Oral Daily Irine Seal, MD   500 mg at 06/21/20 0856  . brimonidine (ALPHAGAN) 0.2 % ophthalmic solution 1 drop  1 drop Both Eyes BID Irine Seal, MD   1 drop at 06/21/20 0900  . Chlorhexidine Gluconate Cloth 2 % PADS 6 each  6 each Topical Q0600 Irine Seal, MD   6 each at 06/20/20 [phone]  . diltiazem (CARDIZEM) tablet 30 mg  30 mg Oral Q6H Kate Sable, MD   30 mg at 06/21/20 0855  .  guaiFENesin-dextromethorphan (ROBITUSSIN DM) 100-10 MG/5ML syrup 10 mL  10 mL Oral Q4H PRN Irine Seal, MD   10 mL at 06/20/20 0430  . insulin aspart (novoLOG) injection 0-20 Units  0-20 Units Subcutaneous TID AC & HS Irine Seal, MD   4 Units at 06/21/20 [phone]  . MEDLINE mouth rinse  15 mL Mouth Rinse BID Irine Seal, MD   15 mL at 06/21/20 0859  . metoprolol tartrate (LOPRESSOR) tablet 50 mg  50 mg Oral Q6H Dunn, Ryan M, PA-C   50 mg at 06/21/20 0856  . ondansetron (ZOFRAN) injection 4 mg  4 mg Intravenous Q6H PRN Irine Seal, MD   4 mg at 06/18/20 1030  . pantoprazole (PROTONIX) EC tablet 40 mg  40 mg Oral Daily Irine Seal, MD   40 mg at 06/21/20 0856  . piperacillin-tazobactam (ZOSYN) IVPB 3.375 g  3.375 g Intravenous Q12H Rauer, Samantha O, RPH 12.5 mL/hr at 06/21/20 0342 3.375 g at 06/21/20 0342  . rosuvastatin (CRESTOR) tablet 10 mg  10 mg Oral Daily Irine Seal, MD   10 mg at 06/21/20 0900  . sodium chloride flush (NS) 0.9 % injection 10-40 mL  10-40 mL Intracatheter Q12H Enzo Bi, MD   10 mL at 06/21/20 0859  . sodium chloride flush (NS) 0.9 % injection 10-40 mL  10-40 mL Intracatheter PRN Enzo Bi, MD      . sodium chloride flush (NS) 0.9 % injection 3 mL  3 mL Intravenous Q12H Irine Seal, MD   3 mL at 06/21/20 0859  . sodium chloride flush (NS) 0.9 % injection 3 mL  3 mL Intravenous PRN Irine Seal, MD      . tamsulosin [facility]) capsule 0.4 mg  0.4 mg Oral Daily Irine Seal, MD   0.4 mg at 06/21/20 0856  . vitamin B-12 (CYANOCOBALAMIN) tablet 1,000 mcg  1,000 mcg Oral Daily Irine Seal, MD   1,000 mcg at 06/21/20 0856  . zinc sulfate capsule 220 mg  220 mg Oral Daily Irine Seal, MD   220 mg at 06/21/20 0856      Allergies: Allergies  Allergen Reactions  . Ibuprofen Itching  . Indomethacin   . Infliximab   . Methotrexate Derivatives   . Moexipril   . Percocet [Oxycodone-Acetaminophen]  Itching  . Naprosyn [Naproxen] Rash      Past Medical History: Past Medical History:   Diagnosis Date  . Arthritis   . Asthma   . Cancer ([place]) 1996   colon  . Diabetes mellitus without complication ([place])   . GERD (gastroesophageal reflux disease)   . Glaucoma   . Hyperlipidemia   . Hypertension   . Lumbar disc disease   . Small bowel obstruction ([place])   . Vitamin B 12 deficiency      Past Surgical History: Past Surgical History:  Procedure Laterality Date  . ABDOMINAL HYSTERECTOMY    . BACK SURGERY    . COLON SURGERY    . COLONOSCOPY WITH PROPOFOL N/A 05/22/2017   Procedure: COLONOSCOPY WITH PROPOFOL;  Surgeon: Manya Silvas, MD;  Location: [facility] ENDOSCOPY;  Service: Endoscopy;  Laterality: N/A;  . CYSTOSCOPY WITH STENT PLACEMENT Right 06/18/2020   Procedure: CYSTOSCOPY WITH STENT PLACEMENT;  Surgeon: Irine Seal, MD;  Location: [facility] ORS;  Service: Urology;  Laterality: Right;  . FRACTURE SURGERY    . KNEE ARTHROSCOPY    . LUMBAR LAMINECTOMY       Family History: Family History  Problem Relation Age of Onset  . Breast cancer Neg Hx      Social History: Social History   Socioeconomic History  . Marital status: Married    Spouse name: Not on file  . Number of children: Not on file  . Years of education: Not on file  . Highest education level: Not on file  Occupational History  . Not on file  Tobacco Use  . Smoking status: Never Smoker  . Smokeless tobacco: Never Used  Vaping Use  . Vaping Use: Never used  Substance and Sexual Activity  . Alcohol use: No  . Drug use: No  . Sexual activity: Yes  Other Topics Concern  . Not on file  Social History Narrative  . Not on file   Social Determinants of Health   Financial Resource Strain:   . Difficulty of Paying Living Expenses: Not on file  Food Insecurity:   . Worried About Charity fundraiser in the Last Year: Not on file  . Ran Out of Food in the Last Year: Not on file  Transportation Needs:   . Lack of Transportation (Medical): Not on file  . Lack of Transportation (Non-Medical):  Not on file  Physical Activity:   . Days of Exercise per Week: Not on file  . Minutes of Exercise per Session: Not on file  Stress:   . Feeling of Stress : Not on file  Social Connections:   . Frequency of Communication with Friends and Family: Not on file  . Frequency of Social Gatherings with Friends and Family: Not on file  . Attends Religious Services: Not on file  . Active Member of Clubs or Organizations: Not on file  . Attends Archivist Meetings: Not on file  . Marital Status: Not on file  Intimate Partner Violence:   . Fear of Current or Ex-Partner: Not on file  . Emotionally Abused: Not on file  . Physically Abused: Not on file  . Sexually Abused: Not on file     Review of Systems: Review of Systems  Constitutional: Negative.   HENT: Negative.   Eyes: Negative.   Respiratory: Positive for cough, hemoptysis, sputum production, shortness of breath and wheezing.   Cardiovascular: Positive for chest pain, palpitations, orthopnea, claudication, leg swelling and PND.  Gastrointestinal: Negative.  Genitourinary: Positive for dysuria, flank pain, frequency, hematuria and urgency.  Musculoskeletal: Negative for back pain, falls, joint pain, myalgias and neck pain.  Skin: Negative.   Neurological: Negative.   Endo/Heme/Allergies: Negative.   Psychiatric/Behavioral: Negative.     Vital Signs: Blood pressure 111/76, pulse 87, temperature 99.2 F (37.3 C), temperature source Oral, resp. rate 20, height 5\' 6"  (1.676 m), weight 82.2 kg, SpO2 95 %.  Weight trends: Filed Weights   06/18/20 0625 06/19/20 0625 06/21/20 0125  Weight: 80.2 kg 80.4 kg 82.2 kg    Physical Exam: General: NAD,   Head: Normocephalic, atraumatic. Moist oral mucosal membranes  Eyes: Anicteric, PERRL  Neck: Supple, trachea midline  Lungs:  Clear to auscultation  Heart: Regular rate and rhythm  Abdomen:  +distended. Right flank tenderness  Extremities: no peripheral edema.  Neurologic:  Nonfocal, moving all four extremities  Skin: No lesions        Lab results: Basic Metabolic Panel: Recent Labs  Lab 06/17/20 1935 06/17/20 1938 06/18/20 0809 06/18/20 0809 06/19/20 0537 06/20/20 0841 06/21/20 0552  NA  --    < > 135   < > 134* 131* 128*  K  --    < > 3.3*   < > 3.6 2.6* 3.2*  CL  --    < > 106   < > 101 99 99  CO2  --    < > 20*   < > 21* 19* 16*  GLUCOSE  --    < > 97   < > 140* 155* 158*  BUN  --    < > 28*   < > 30* 33* 43*  CREATININE  --    < > 1.75*   < > 2.28* 2.93* 3.79*  CALCIUM  --    < > 8.1*   < > 7.8* 7.0* 6.8*  MG 1.8  --  1.8  --   --  1.7 1.7  PHOS  --   --  2.9  --   --   --   --    < > = values in this interval not displayed.    Liver Function Tests: Recent Labs  Lab 06/17/20 0357 06/18/20 0809 06/19/20 0537  AST 27 26 30   ALT 21 15 15   ALKPHOS 83 84 110  BILITOT 1.0 0.6 0.5  PROT 6.2* 5.3* 5.9*  ALBUMIN 3.0* 2.2* 2.3*   No results for input(s): LIPASE, AMYLASE in the last 168 hours. No results for input(s): AMMONIA in the last 168 hours.  CBC: Recent Labs  Lab 06/17/20 0357 06/17/20 0357 06/18/20 0809 06/18/20 0809 06/19/20 0537 06/20/20 0841 06/21/20 0552  WBC 20.4*   < > 13.7*   < > 13.5* 12.4* 12.9*  NEUTROABS 17.8*  --  10.7*  --  11.8*  --   --   HGB 11.8*   < > 11.1*   < > 10.5* 10.4* 10.2*  HCT 34.2*   < > 31.5*   < > 31.2* 29.6* 29.3*  MCV 82.4  --  80.6  --  83.6 80.2 80.3  PLT 144*   < > 97*   < > 96* 94* 93*   < > = values in this interval not displayed.    Cardiac Enzymes: No results for input(s): CKTOTAL, CKMB, CKMBINDEX, TROPONINI in the last 168 hours.  BNP: Invalid input(s): POCBNP  CBG: Recent Labs  Lab 06/20/20 0846 06/20/20 1203 06/20/20 1702 06/20/20 2123 06/21/20 0824  GLUCAP 160* 170* 172*  152* 160*    Microbiology: Results for orders placed or performed during the hospital encounter of 06/17/20  Blood culture (routine x 2)     Status: Abnormal   Collection Time: 06/17/20   3:57 AM   Specimen: BLOOD  Result Value Ref Range Status   Specimen Description   Final    BLOOD LEFT HAND Performed at [facility], [address], [place], [place] [address]    Special Requests   Final    BOTTLES DRAWN AEROBIC AND ANAEROBIC Blood Culture adequate volume Performed at [facility], [address] [place], [place] [address]    Culture  Setup Time   Final    Organism ID to follow GRAM NEGATIVE RODS IN BOTH AEROBIC AND ANAEROBIC BOTTLES CRITICAL RESULT CALLED TO, READ BACK BY AND VERIFIED WITH: AMY THOMPSON AT 1534 ON 06/17/20 SNG Performed at [facility], [address] [place], [place] [address]    Culture KLEBSIELLA PNEUMONIAE (A)  Final   Report Status 06/19/2020 FINAL  Final   Organism ID, Bacteria KLEBSIELLA PNEUMONIAE  Final      Susceptibility   Klebsiella pneumoniae - MIC*    AMPICILLIN RESISTANT Resistant     CEFAZOLIN <=4 SENSITIVE Sensitive     CEFEPIME <=0.12 SENSITIVE Sensitive     CEFTAZIDIME <=1 SENSITIVE Sensitive     CEFTRIAXONE <=0.25 SENSITIVE Sensitive     CIPROFLOXACIN <=0.25 SENSITIVE Sensitive     GENTAMICIN <=1 SENSITIVE Sensitive     IMIPENEM <=0.25 SENSITIVE Sensitive     TRIMETH/SULFA <=20 SENSITIVE Sensitive     AMPICILLIN/SULBACTAM 4 SENSITIVE Sensitive     PIP/TAZO <=4 SENSITIVE Sensitive     * KLEBSIELLA PNEUMONIAE  Blood culture (routine x 2)     Status: Abnormal   Collection Time: 06/17/20  3:57 AM   Specimen: BLOOD  Result Value Ref Range Status   Specimen Description   Final    BLOOD RIGHT [facility] Performed at [facility] [address] [place], [place] [address]    Special Requests   Final    BOTTLES DRAWN AEROBIC AND ANAEROBIC Blood Culture adequate volume Performed at [facility], [address] [place], [place] [address]    Culture  Setup Time   Final    GRAM NEGATIVE RODS IN BOTH AEROBIC AND ANAEROBIC BOTTLES CRITICAL VALUE NOTED.  VALUE IS CONSISTENT WITH  PREVIOUSLY REPORTED AND CALLED VALUE. Performed at [facility], [address] [place], [place] [address]    Culture (A)  Final    KLEBSIELLA PNEUMONIAE SUSCEPTIBILITIES PERFORMED ON PREVIOUS CULTURE WITHIN THE LAST 5 DAYS. Performed at [facility], [place] [address] [place], [place] [address]    Report Status 06/19/2020 FINAL  Final  Blood Culture ID Panel (Reflexed)     Status: Abnormal   Collection Time: 06/17/20  3:57 AM  Result Value Ref Range Status   Enterococcus faecalis NOT DETECTED NOT DETECTED Final   Enterococcus Faecium NOT DETECTED NOT DETECTED Final   Listeria monocytogenes NOT DETECTED NOT DETECTED Final   Staphylococcus species NOT DETECTED NOT DETECTED Final   Staphylococcus aureus (BCID) NOT DETECTED NOT DETECTED Final   Staphylococcus epidermidis NOT DETECTED NOT DETECTED Final   Staphylococcus lugdunensis NOT DETECTED NOT DETECTED Final   Streptococcus species NOT DETECTED NOT DETECTED Final   Streptococcus agalactiae NOT DETECTED NOT DETECTED Final   Streptococcus pneumoniae NOT DETECTED NOT DETECTED Final   Streptococcus pyogenes NOT DETECTED NOT DETECTED Final   A.calcoaceticus-baumannii  NOT DETECTED NOT DETECTED Final   Bacteroides fragilis NOT DETECTED NOT DETECTED Final   Enterobacterales DETECTED (A) NOT DETECTED Final    Comment: Enterobacterales represent a large order of gram negative bacteria, not a single organism. CRITICAL RESULT CALLED TO, READ BACK BY AND VERIFIED WITH: AMY THOMPSON AT 1610 ON 06/17/20 SNG    Enterobacter cloacae complex NOT DETECTED NOT DETECTED Final   Escherichia coli NOT DETECTED NOT DETECTED Final   Klebsiella aerogenes NOT DETECTED NOT DETECTED Final   Klebsiella oxytoca NOT DETECTED NOT DETECTED Final   Klebsiella pneumoniae DETECTED (A) NOT DETECTED Final    Comment: CRITICAL RESULT CALLED TO, READ BACK BY AND VERIFIED WITH: AMY THOMPSON RN AT 9604 ON 06/17/20 SNG    Proteus species NOT DETECTED NOT  DETECTED Final   Salmonella species NOT DETECTED NOT DETECTED Final   Serratia marcescens NOT DETECTED NOT DETECTED Final   Haemophilus influenzae NOT DETECTED NOT DETECTED Final   Neisseria meningitidis NOT DETECTED NOT DETECTED Final   Pseudomonas aeruginosa NOT DETECTED NOT DETECTED Final   Stenotrophomonas maltophilia NOT DETECTED NOT DETECTED Final   Candida albicans NOT DETECTED NOT DETECTED Final   Candida auris NOT DETECTED NOT DETECTED Final   Candida glabrata NOT DETECTED NOT DETECTED Final   Candida krusei NOT DETECTED NOT DETECTED Final   Candida parapsilosis NOT DETECTED NOT DETECTED Final   Candida tropicalis NOT DETECTED NOT DETECTED Final   Cryptococcus neoformans/gattii NOT DETECTED NOT DETECTED Final   CTX-M ESBL NOT DETECTED NOT DETECTED Final   Carbapenem resistance IMP NOT DETECTED NOT DETECTED Final   Carbapenem resistance KPC NOT DETECTED NOT DETECTED Final   Carbapenem resistance NDM NOT DETECTED NOT DETECTED Final   Carbapenem resist OXA 48 LIKE NOT DETECTED NOT DETECTED Final   Carbapenem resistance VIM NOT DETECTED NOT DETECTED Final    Comment: Performed at [facility], [address] [place], [place] [address]  Urine Culture     Status: Abnormal   Collection Time: 06/17/20 11:01 AM   Specimen: Urine, Random  Result Value Ref Range Status   Specimen Description   Final    URINE, RANDOM Performed at [facility], [address], [place], [place] [address]    Special Requests   Final    NONE Performed at [facility], [address] [place],  [address]    Culture >=100,000 COLONIES/mL KLEBSIELLA PNEUMONIAE (A)  Final   Report Status 06/19/2020 FINAL  Final   Organism ID, Bacteria KLEBSIELLA PNEUMONIAE (A)  Final      Susceptibility   Klebsiella pneumoniae - MIC*    AMPICILLIN RESISTANT Resistant     CEFAZOLIN <=4 SENSITIVE Sensitive     CEFTRIAXONE <=0.25 SENSITIVE Sensitive     CIPROFLOXACIN <=0.25 SENSITIVE  Sensitive     GENTAMICIN <=1 SENSITIVE Sensitive     IMIPENEM <=0.25 SENSITIVE Sensitive     NITROFURANTOIN 32 SENSITIVE Sensitive     TRIMETH/SULFA <=20 SENSITIVE Sensitive     AMPICILLIN/SULBACTAM 4 SENSITIVE Sensitive     PIP/TAZO <=4 SENSITIVE Sensitive     * >=100,000 COLONIES/mL KLEBSIELLA PNEUMONIAE    Coagulation Studies: Recent Labs    06/18/20 2048  LABPROT 14.4  INR 1.2    Urinalysis: No results for input(s): COLORURINE, LABSPEC, PHURINE, GLUCOSEU, HGBUR, BILIRUBINUR, KETONESUR, PROTEINUR, UROBILINOGEN, NITRITE, LEUKOCYTESUR in the last 72 hours.  Invalid input(s): APPERANCEUR    Imaging: US RENAL  Result Date: 06/20/2020 CLINICAL DATA:  Acute renal insufficiency. EXAM: RENAL / URINARY  TRACT ULTRASOUND COMPLETE COMPARISON:  None. FINDINGS: Right Kidney: Renal measurements: 13.8 cm x 6.4 cm x 7.6 cm = volume: 350 mL. Diffusely increased echogenicity of the renal parenchyma is seen. No mass or hydronephrosis visualized. Left Kidney: Renal measurements: 12.0 cm x 5.5 cm x 5.4 cm = volume: 188 mL. Diffusely increased echogenicity of the renal parenchyma is seen. No mass or hydronephrosis visualized. Bladder: A Foley catheter is in place. Other: None. IMPRESSION: 1. Diffusely increased echogenicity of both kidneys, which may be secondary to medical renal disease. Electronically Signed   By: Virgina Norfolk M.D.   On: 06/20/2020 19:33   ECHOCARDIOGRAM COMPLETE  Result Date: 06/19/2020    ECHOCARDIOGRAM REPORT   Patient Name:   MUNACHIMSO RIGDON Date of Exam: 06/19/2020 Medical Rec #:  [id number]     Height:       66.0 in Accession #:    [id number]    Weight:       177.2 lb Date of Birth:  Apr 15, 1941      BSA:          1.900 m Patient Age:    46 years      BP:           125/80 mmHg Patient Gender: F             HR:           153 bpm. Exam Location:  [facility] Procedure: 2D Echo, Cardiac Doppler and Color Doppler Indications:     Atrial Fibrillation 427.31 / I48.91  History:         Patient  has no prior history of Echocardiogram examinations.                  Risk Factors:Hypertension and Diabetes.  Sonographer:     Alyse Low Roar Referring Phys:  [place] Diagnosing Phys: Kate Sable MD IMPRESSIONS  1. Left ventricular ejection fraction, by estimation, is 50 to 55%. The left ventricle has low normal function. The left ventricle has no regional wall motion abnormalities. There is mild left ventricular hypertrophy. Indeterminate diastolic filling due  to E-A fusion.  2. Right ventricular systolic function is normal. The right ventricular size is normal.  3. The mitral valve is normal in structure. No evidence of mitral valve regurgitation. No evidence of mitral stenosis.  4. The aortic valve is normal in structure. Aortic valve regurgitation is not visualized. No aortic stenosis is present. FINDINGS  Left Ventricle: Left ventricular ejection fraction, by estimation, is 50 to 55%. The left ventricle has low normal function. The left ventricle has no regional wall motion abnormalities. The left ventricular internal cavity size was normal in size. There is mild left ventricular hypertrophy. Indeterminate diastolic filling due to E-A fusion. Right Ventricle: The right ventricular size is normal. No increase in right ventricular wall thickness. Right ventricular systolic function is normal. Left Atrium: Left atrial size was normal in size. Right Atrium: Right atrial size was normal in size. Pericardium: There is no evidence of pericardial effusion. Mitral Valve: The mitral valve is normal in structure. No evidence of mitral valve regurgitation. No evidence of mitral valve stenosis. Tricuspid Valve: The tricuspid valve is grossly normal. Tricuspid valve regurgitation is not demonstrated. No evidence of tricuspid stenosis. Aortic Valve: The aortic valve is normal in structure. Aortic valve regurgitation is not visualized. No aortic stenosis is present. Aortic valve peak gradient measures 2.8 mmHg.  Pulmonic Valve: The pulmonic valve was not well visualized. Pulmonic valve  regurgitation is not visualized. No evidence of pulmonic stenosis. Aorta: The aortic root is normal in size and structure. Venous: The inferior vena cava was not well visualized. IAS/Shunts: No atrial level shunt detected by color flow Doppler.  LEFT VENTRICLE PLAX 2D LVIDd:         4.34 cm  Diastology LVIDs:         3.24 cm  LV e' medial:    8.05 cm/s LV PW:         1.11 cm  LV E/e' medial:  12.1 LV IVS:        1.38 cm  LV e' lateral:   10.10 cm/s LVOT diam:     1.80 cm  LV E/e' lateral: 9.6 LVOT Area:     2.54 cm  RIGHT VENTRICLE RV Mid diam:    2.70 cm RV S prime:     19.00 cm/s TAPSE (M-mode): 1.6 cm LEFT ATRIUM           Index       RIGHT ATRIUM           Index LA diam:      3.40 cm 1.79 cm/m  RA Area:     12.60 cm LA Vol (A4C): 34.8 ml 18.32 ml/m RA Volume:   27.80 ml  14.63 ml/m  AORTIC VALVE               PULMONIC VALVE AV Area (Vmax): 2.66 cm   PV Vmax:        0.96 m/s AV Vmax:        84.20 cm/s PV Peak grad:   3.7 mmHg AV Peak Grad:   2.8 mmHg   RVOT Peak grad: 4 mmHg LVOT Vmax:      88.10 cm/s  AORTA Ao Root diam: 3.30 cm MITRAL VALVE               TRICUSPID VALVE MV Area (PHT): 3.60 cm    TR Peak grad:   17.0 mmHg MV Decel Time: 211 msec    TR Vmax:        206.00 cm/s MV E velocity: 97.30 cm/s                            SHUNTS                            Systemic Diam: 1.80 cm Kate Sable MD Electronically signed by Kate Sable MD Signature Date/Time: 06/19/2020/4:20:09 PM    Final       Assessment & Plan: Ms. ANYLA ISRAELSON is a 79 y.o. whtie female with hypertension, hyperlipdemia, GERD, glaucoma, diabetes mellitus type II, history of colon cancer, who was admitted to [facility] on 06/17/2020 for CAP (community acquired pneumonia) [J18.9] AKI (acute kidney injury) ([place]) [N17.9] Paroxysmal SVT (supraventricular tachycardia) ([place]) [I47.1] Sepsis ([place]) [A41.9] Sepsis with acute renal failure without septic shock,  due to unspecified organism, unspecified acute renal failure type ([place]) [A41.9, R65.20, N17.9] COVID-19 [U07.1]  1. Acute renal failure: baseline creatinine of 0.9 with normal GFR on 10/16/2019.  Acute renal failure secondary to ATN with sepsis, obstructive uropathy, IV contrast nephropathy and prolonged prerenal azotemia.  No acute indication for dialysis.  Currently holding patient's home dose of hydrochlorothiazide.   2. Obstructive uropathy with pyelonephritis with klebsiella. Status post right ureteral stent placement by Dr. Jeffie Pollock on 9/18. Currently on pip/tazo  3. Hyponatremia:  acute on chronic.  Chronic hyponatremia can be attributed to her hydrochlorothiazide Acute most likely secondary to renal losses and renal failure  4. Hypokalemia: with renal losses  5. Metabolic acidosis: with anion gap. Consistent with normal saline administration and acute renal failure.   6. Anemia with renal failure: hemoglobni 10.2, normocytic   LOS: 4 Shaelyn Decarli 9/21/202111:06 AM

## 2020-06-21 NOTE — Plan of Care (Signed)
Discussed with patient plan of care for the evening, pain management and foley care with some teachb back displayed.  Updated husband per his phone call with all questions answered at this time

## 2020-06-22 ENCOUNTER — Inpatient Hospital Stay: Payer: Medicare Other

## 2020-06-22 LAB — BASIC METABOLIC PANEL
Anion gap: 14 (ref 5–15)
BUN: 54 mg/dL — ABNORMAL HIGH (ref 8–23)
CO2: 14 mmol/L — ABNORMAL LOW (ref 22–32)
Calcium: 6.5 mg/dL — ABNORMAL LOW (ref 8.9–10.3)
Chloride: 100 mmol/L (ref 98–111)
Creatinine, Ser: 4.06 mg/dL — ABNORMAL HIGH (ref 0.44–1.00)
GFR calc Af Amer: 12 mL/min — ABNORMAL LOW (ref >60)
GFR calc non Af Amer: 10 mL/min — ABNORMAL LOW (ref >60)
Glucose, Bld: 131 mg/dL — ABNORMAL HIGH (ref 70–99)
Potassium: 3.5 mmol/L (ref 3.5–5.1)
Sodium: 128 mmol/L — ABNORMAL LOW (ref 135–145)

## 2020-06-22 LAB — CBC
HCT: 29.7 % — ABNORMAL LOW (ref 36.0–46.0)
Hemoglobin: 10.1 g/dL — ABNORMAL LOW (ref 12.0–15.0)
MCH: 27.4 pg (ref 26.0–34.0)
MCHC: 34 g/dL (ref 30.0–36.0)
MCV: 80.5 fL (ref 80.0–100.0)
Platelets: 105 10*3/uL — ABNORMAL LOW (ref 150–400)
RBC: 3.69 MIL/uL — ABNORMAL LOW (ref 3.87–5.11)
RDW: 15.9 % — ABNORMAL HIGH (ref 11.5–15.5)
WBC: 14.8 10*3/uL — ABNORMAL HIGH (ref 4.0–10.5)
nRBC: 0 % (ref 0.0–0.2)

## 2020-06-22 LAB — GLUCOSE, CAPILLARY
Glucose-Capillary: 117 mg/dL — ABNORMAL HIGH (ref 70–99)
Glucose-Capillary: 147 mg/dL — ABNORMAL HIGH (ref 70–99)
Glucose-Capillary: 143 mg/dL — ABNORMAL HIGH (ref 70–99)
Glucose-Capillary: 123 mg/dL — ABNORMAL HIGH (ref 70–99)

## 2020-06-22 LAB — C-REACTIVE PROTEIN: CRP: 19.2 mg/dL — ABNORMAL HIGH (ref <1.0)

## 2020-06-22 LAB — MAGNESIUM: Magnesium: 1.7 mg/dL (ref 1.7–2.4)

## 2020-06-22 MED ORDER — DILTIAZEM HCL ER COATED BEADS 120 MG PO CP24
120.0000 mg | ORAL_CAPSULE | Freq: Every day | ORAL | Status: DC
  Administered 2020-06-22 – 2020-06-24 (×3): 120 mg via ORAL
  Filled 2020-06-22 (×3): qty 1

## 2020-06-22 MED ORDER — METOPROLOL TARTRATE 50 MG PO TABS
100.0000 mg | ORAL_TABLET | Freq: Two times a day (BID) | ORAL | Status: DC
  Administered 2020-06-22 – 2020-06-24 (×4): 100 mg via ORAL
  Filled 2020-06-22 (×4): qty 2

## 2020-06-22 MED ORDER — APIXABAN 5 MG PO TABS
5.0000 mg | ORAL_TABLET | Freq: Two times a day (BID) | ORAL | Status: DC
  Administered 2020-06-22 – 2020-06-23 (×3): 5 mg via ORAL
  Filled 2020-06-22 (×3): qty 1

## 2020-06-22 MED ORDER — POTASSIUM CHLORIDE CRYS ER 20 MEQ PO TBCR
40.0000 meq | EXTENDED_RELEASE_TABLET | Freq: Once | ORAL | Status: AC
  Administered 2020-06-22: 40 meq via ORAL
  Filled 2020-06-22: qty 2

## 2020-06-22 MED ORDER — POTASSIUM CHLORIDE 2 MEQ/ML IV SOLN
INTRAVENOUS | Status: DC
  Filled 2020-06-22 (×4): qty 1000

## 2020-06-22 NOTE — Progress Notes (Signed)
PROGRESS NOTE    Stephanie [name]  CVE:[id number] DOB: Apr 08, 1941 DOA: 06/17/2020 PCP: Rusty Aus, MD    Assessment & Plan:   Principal Problem:   Sepsis [facility]) Active Problems:   Benign essential hypertension   Chronic cystitis   Rheumatoid arthritis involving multiple sites with positive rheumatoid factor ([place])   Diabetes mellitus ([place])   Rheumatoid arthritis ([place])   COVID-19 virus infection   AKI (acute kidney injury) ([place])   Atrial fibrillation with rapid ventricular response ([place])   Essential hypertension   Pure hypercholesterolemia   Acute pyelonephritis   Hydronephrosis, right   Lactic acid acidosis   Hyperglycemia   Hx of essential hypertension   Immunosuppression due to drug therapy   Pressure injury of skin   Acquired thrombophilia ([place])   On continuous oral anticoagulation    Stephanie [name] is a 79 y.o. female with medical history significant for COVID-19 viral infection (positive PCR test June 09, 2020), history of diabetes mellitus, history of rheumatoid arthritis on chronic immunosuppressive therapy, hypertension and a remote history of colon cancer who was brought into the ER by EMS for evaluation of fever.  Patient had a T-max of 104F at home.  She complains of generalized weakness and worsening shortness of breath but denies having any nausea, no vomiting, no chest pain or abdominal pain.  Patient does self caths at home. Patient is fully vaccinated She was started on prednisone by her primary care provider for presumed COVID-19 bronchitis   # Sepsis 2/2 Klebsiella bacteremia and  # right pyelonephritis  --As evidenced by fever with a T-max of 103F, tachycardia, tachypnea, leukocytosis of 20,000 with a left shift and lactic acid of 3.1.  UA positive for infection.  Pt started on ceftriaxone, then broadened to zosyn on 9/19 --CT renal showed "Debris present dependently within the right renal collecting system and bladder"   --Pt received 2L LR f/b  MIVF --Urine grew pan-sensitive Klebsiella. --blood cx 2/2 pos for Klebsiella, repeat blood cx obtained on 9/21, neg growth so far. PLAN:  --continue zosyn  # Severe right hydronephrosis --CT renal showed "Progressive right UPJ obstruction compared to prior October of 2020." --right stent placement by urology 9/18 (found frank pus in the kidney) --US renal 9/20 showed resolved hydro PLAN: --continue Foley  --continue MIVF but change to LR@75  with KCl --strict I/O --repeat US renal, per urology  # New onset Afib w RVR, persistent # Acquired thrombophilia  --developed while in the ED.  HR 120's-140's despite multiple doses of IV dilt, IV metop, dilt gtt@15   --started heparin gtt 9/18, transitioned to Eliquis  --It appeared that her right hand IV was infiltrated and all the IV dilt, IV metop and IV amiodarone may have not gotten into her system. --cardiology consulted --rate controlled on current regimen PLAN: -Consolidate Lopressor 200 mg twice daily. -Consolidate Cardizem to 120 mg CD daily --continue Eliquis, ok to use with decreased renal function  # COVID-19 infection --Pt is fully vaccinated.  Patient's COVID-19 PCR test was positive on 06/09/20.  No overt respiratory symptoms.  CT chest clear.  No real O2 requirement on presentation. --Remdesivir started on admission PLAN: --continue Remdesivir --monitor O2 sats --according to infection control, due to pt's immunosuppressed status PTA, pt needs the 21 day isolation.  AKI, worsening --on admission creatinine is 1.5.  Baseline Cr 0.8.  Acute renal failure secondary to ATN with sepsis, obstructive uropathy, IV contrast nephropathy and prolonged prerenal azotemia.  --US renal showed resolved hydro  and medical renal disease --nephrology consulted PLAN: --continue MIVF but change to LR@75  with KCl  Lactic acidosis 2/2 sepsis --resolved with IVF.  Hx Diabetes mellitus  hyperglycemia related to systemic steroid  administration Patient was started on prednisone as an outpatient by her primary care provider for presumed COVID-19 bronchitis Chest x-ray is negative for pneumonia and patient is not hypoxic Prednisone d/c'ed. 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  Mild dyspnea.  No flank pain or dysuria.     Objective: Vitals:   06/22/20 0746 06/22/20 0800 06/22/20 1000 06/22/20 1150  BP: 125/79 114/76 109/71 (!) 113/98  Pulse: 89 87 78 91  Resp: (!) 27 (!) 36 (!) 22 20  Temp: 97.9 F (36.6 C) 98 F (36.7 C)  98 F (36.7 C)  TempSrc: Oral   Oral  SpO2: 98% 95% 97% 96%  Weight:      Height:        Intake/Output Summary (Last 24 hours) at 06/22/2020 1433 Last data filed at 06/22/2020 1300 Gross per 24 hour  Intake 3253.59 ml  Output 650 ml  Net 2603.59 ml   Filed Weights   06/19/20 0625 06/21/20 0125 06/22/20 0453  Weight: 80.4 kg 82.2 kg 86.8 kg    Examination:   Constitutional: NAD, AAOx3 HEENT: conjunctivae and lids normal, EOMI CV: irregular, no M,R,G. Distal pulses  +2.  No cyanosis.   RESP: normal respiratory effort, on 2L GI: +BS, NTND Extremities: No effusions, edema in BLE.  Right hand edema resolved. SKIN: warm, dry and intact Neuro: II - XII grossly intact.  Sensation intact Psych: Normal mood and affect.    Foley present, draining urine with some debris sediments.    Data Reviewed: I have personally reviewed following labs and imaging studies  CBC: Recent Labs  Lab 06/17/20 0357 06/17/20 0357 06/18/20 0809 06/19/20 0537 06/20/20 0841 06/21/20 0552 06/22/20 0543  WBC 20.4*   < > 13.7* 13.5* 12.4* 12.9* 14.8*  NEUTROABS 17.8*  --  10.7* 11.8*  --   --   --   HGB 11.8*   < > 11.1* 10.5* 10.4* 10.2* 10.1*  HCT 34.2*   < > 31.5* 31.2* 29.6* 29.3* 29.7*  MCV 82.4   < > 80.6 83.6 80.2 80.3 80.5  PLT 144*   < > 97* 96* 94* 93* 105*   < > = values in this interval not displayed.   Basic Metabolic Panel: Recent Labs  Lab 06/17/20 1935 06/17/20 1938 06/18/20 0809 06/19/20 0537 06/20/20 0841 06/21/20 0552 06/22/20 0543  NA  --    < >  135 134* 131* 128* 128*  K  --    < > 3.3* 3.6 2.6* 3.2* 3.5  CL  --    < > 106 101 99 99 100  CO2  --    < > 20* 21* 19* 16* 14*  GLUCOSE  --    < > 97 140* 155* 158* 131*  BUN  --    < > 28* 30* 33* 43* 54*  CREATININE  --    < > 1.75* 2.28* 2.93* 3.79* 4.06*  CALCIUM  --    < > 8.1* 7.8* 7.0* 6.8* 6.5*  MG 1.8  --  1.8  --  1.7 1.7 1.7  PHOS  --   --  2.9  --   --   --   --    < > = values in this interval not displayed.   GFR: Estimated Creatinine Clearance: 12.9 mL/min (A) (by C-G formula based on SCr of 4.06 mg/dL (H)). Liver Function Tests: Recent Labs  Lab 06/17/20 0357 06/18/20 0809 06/19/20 0537  AST 27 26 30   ALT 21 15 15   ALKPHOS 83 84 110  BILITOT 1.0 0.6 0.5  PROT 6.2* 5.3* 5.9*  ALBUMIN 3.0* 2.2* 2.3*   No results for input(s): LIPASE, AMYLASE in the last 168 hours. No results for input(s): AMMONIA in the last 168 hours. Coagulation Profile: Recent Labs  Lab  06/18/20 2048  INR 1.2   Cardiac Enzymes: No results for input(s): CKTOTAL, CKMB, CKMBINDEX, TROPONINI in the last 168 hours. BNP (last 3 results) No results for input(s): PROBNP in the last 8760 hours. HbA1C: No results for input(s): HGBA1C in the last 72 hours. CBG: Recent Labs  Lab 06/21/20 1208 06/21/20 1724 06/21/20 2042 06/22/20 0744 06/22/20 1149  GLUCAP 226* 150* 153* 117* 147*   Lipid Profile: No results for input(s): CHOL, HDL, LDLCALC, TRIG, CHOLHDL, LDLDIRECT in the last 72 hours. Thyroid Function Tests: No results for input(s): TSH, T4TOTAL, FREET4, T3FREE, THYROIDAB in the last 72 hours. Anemia Panel: No results for input(s): VITAMINB12, FOLATE, FERRITIN, TIBC, IRON, RETICCTPCT in the last 72 hours. Sepsis Labs: Recent Labs  Lab 06/17/20 0357 06/17/20 1101 06/17/20 1935 06/17/20 2217  PROCALCITON 17.78  --   --   --   LATICACIDVEN 3.1* 2.5* 2.8* 1.6    Recent Results (from the past 240 hour(s))  Blood culture (routine x 2)     Status: Abnormal   Collection Time: 06/17/20  3:57 AM   Specimen: BLOOD  Result Value Ref Range Status   Specimen Description   Final    BLOOD LEFT HAND Performed at [facility], [address] [place], [place] [address]    Special Requests   Final    BOTTLES DRAWN AEROBIC AND ANAEROBIC Blood Culture adequate volume Performed at [facility], [address] [place], [place] [address]    Culture  Setup Time   Final    Organism ID to follow GRAM NEGATIVE RODS IN BOTH AEROBIC AND ANAEROBIC BOTTLES CRITICAL RESULT CALLED TO, READ BACK BY AND VERIFIED WITH: AMY THOMPSON AT 1534 ON 06/17/20 SNG Performed at [facility], [address] [place], [place] [address]    Culture KLEBSIELLA PNEUMONIAE (A)  Final   Report Status 06/19/2020 FINAL  Final   Organism ID, Bacteria KLEBSIELLA PNEUMONIAE  Final      Susceptibility   Klebsiella pneumoniae - MIC*    AMPICILLIN RESISTANT Resistant      CEFAZOLIN <=4 SENSITIVE Sensitive  CEFEPIME <=0.12 SENSITIVE Sensitive     CEFTAZIDIME <=1 SENSITIVE Sensitive     CEFTRIAXONE <=0.25 SENSITIVE Sensitive     CIPROFLOXACIN <=0.25 SENSITIVE Sensitive     GENTAMICIN <=1 SENSITIVE Sensitive     IMIPENEM <=0.25 SENSITIVE Sensitive     TRIMETH/SULFA <=20 SENSITIVE Sensitive     AMPICILLIN/SULBACTAM 4 SENSITIVE Sensitive     PIP/TAZO <=4 SENSITIVE Sensitive     * KLEBSIELLA PNEUMONIAE  Blood culture (routine x 2)     Status: Abnormal   Collection Time: 06/17/20  3:57 AM   Specimen: BLOOD  Result Value Ref Range Status   Specimen Description   Final    BLOOD RIGHT [facility] Performed at [facility], [address], [place], [place] [address]    Special Requests   Final    BOTTLES DRAWN AEROBIC AND ANAEROBIC Blood Culture adequate volume Performed at [facility], [address] [place], [place] [address]    Culture  Setup Time   Final    GRAM NEGATIVE RODS IN BOTH AEROBIC AND ANAEROBIC BOTTLES CRITICAL VALUE NOTED.  VALUE IS CONSISTENT WITH PREVIOUSLY REPORTED AND CALLED VALUE. Performed at [facility], [address] [place], [place] [address]    Culture (A)  Final    KLEBSIELLA PNEUMONIAE SUSCEPTIBILITIES PERFORMED ON PREVIOUS CULTURE WITHIN THE LAST 5 DAYS. Performed at [facility], [place] [address], [place], [place] [address]    Report Status 06/19/2020 FINAL  Final  Blood Culture ID Panel (Reflexed)     Status: Abnormal   Collection Time: 06/17/20  3:57 AM  Result Value Ref Range Status   Enterococcus faecalis NOT DETECTED NOT DETECTED Final   Enterococcus Faecium NOT DETECTED NOT DETECTED Final   Listeria monocytogenes NOT DETECTED NOT DETECTED Final   Staphylococcus species NOT DETECTED NOT DETECTED Final   Staphylococcus aureus (BCID) NOT DETECTED NOT DETECTED Final   Staphylococcus epidermidis NOT DETECTED NOT DETECTED Final   Staphylococcus lugdunensis NOT DETECTED NOT DETECTED Final    Streptococcus species NOT DETECTED NOT DETECTED Final   Streptococcus agalactiae NOT DETECTED NOT DETECTED Final   Streptococcus pneumoniae NOT DETECTED NOT DETECTED Final   Streptococcus pyogenes NOT DETECTED NOT DETECTED Final   A.calcoaceticus-baumannii NOT DETECTED NOT DETECTED Final   Bacteroides fragilis NOT DETECTED NOT DETECTED Final   Enterobacterales DETECTED (A) NOT DETECTED Final    Comment: Enterobacterales represent a large order of gram negative bacteria, not a single organism. CRITICAL RESULT CALLED TO, READ BACK BY AND VERIFIED WITH: AMY THOMPSON AT 3244 ON 06/17/20 SNG    Enterobacter cloacae complex NOT DETECTED NOT DETECTED Final   Escherichia coli NOT DETECTED NOT DETECTED Final   Klebsiella aerogenes NOT DETECTED NOT DETECTED Final   Klebsiella oxytoca NOT DETECTED NOT DETECTED Final   Klebsiella pneumoniae DETECTED (A) NOT DETECTED Final    Comment: CRITICAL RESULT CALLED TO, READ BACK BY AND VERIFIED WITH: AMY THOMPSON RN AT 0102 ON 06/17/20 SNG    Proteus species NOT DETECTED NOT DETECTED Final   Salmonella species NOT DETECTED NOT DETECTED Final   Serratia marcescens NOT DETECTED NOT DETECTED Final   Haemophilus influenzae NOT DETECTED NOT DETECTED Final   Neisseria meningitidis NOT DETECTED NOT DETECTED Final   Pseudomonas aeruginosa NOT DETECTED NOT DETECTED Final   Stenotrophomonas maltophilia NOT DETECTED NOT DETECTED Final   Candida albicans NOT DETECTED NOT DETECTED Final   Candida auris NOT DETECTED NOT DETECTED Final   Candida glabrata NOT DETECTED NOT DETECTED Final  Candida krusei NOT DETECTED NOT DETECTED Final   Candida parapsilosis NOT DETECTED NOT DETECTED Final   Candida tropicalis NOT DETECTED NOT DETECTED Final   Cryptococcus neoformans/gattii NOT DETECTED NOT DETECTED Final   CTX-M ESBL NOT DETECTED NOT DETECTED Final   Carbapenem resistance IMP NOT DETECTED NOT DETECTED Final   Carbapenem resistance KPC NOT DETECTED NOT DETECTED Final    Carbapenem resistance NDM NOT DETECTED NOT DETECTED Final   Carbapenem resist OXA 48 LIKE NOT DETECTED NOT DETECTED Final   Carbapenem resistance VIM NOT DETECTED NOT DETECTED Final    Comment: Performed at [facility], [address], [place], [place] [address]  Urine Culture     Status: Abnormal   Collection Time: 06/17/20 11:01 AM   Specimen: Urine, Random  Result Value Ref Range Status   Specimen Description   Final    URINE, RANDOM Performed at [facility], [address] [place], [place] [address]    Special Requests   Final    NONE Performed at [facility], [address], [place], [place] [address]    Culture >=100,000 COLONIES/mL KLEBSIELLA PNEUMONIAE (A)  Final   Report Status 06/19/2020 FINAL  Final   Organism ID, Bacteria KLEBSIELLA PNEUMONIAE (A)  Final      Susceptibility   Klebsiella pneumoniae - MIC*    AMPICILLIN RESISTANT Resistant     CEFAZOLIN <=4 SENSITIVE Sensitive     CEFTRIAXONE <=0.25 SENSITIVE Sensitive     CIPROFLOXACIN <=0.25 SENSITIVE Sensitive     GENTAMICIN <=1 SENSITIVE Sensitive     IMIPENEM <=0.25 SENSITIVE Sensitive     NITROFURANTOIN 32 SENSITIVE Sensitive     TRIMETH/SULFA <=20 SENSITIVE Sensitive     AMPICILLIN/SULBACTAM 4 SENSITIVE Sensitive     PIP/TAZO <=4 SENSITIVE Sensitive     * >=100,000 COLONIES/mL KLEBSIELLA PNEUMONIAE  Culture, blood (Routine X 2) w Reflex to ID Panel     Status: None (Preliminary result)   Collection Time: 06/21/20 11:07 AM   Specimen: BLOOD  Result Value Ref Range Status   Specimen Description BLOOD RIGHT [facility]  Final   Special Requests   Final    BOTTLES DRAWN AEROBIC AND ANAEROBIC Blood Culture adequate volume   Culture   Final    NO GROWTH < 24 HOURS Performed at [facility], [address] [place], [place] [address]    Report Status PENDING  Incomplete  Culture, blood (Routine X 2) w Reflex to ID Panel     Status: None (Preliminary result)   Collection  Time: 06/21/20 11:09 AM   Specimen: BLOOD  Result Value Ref Range Status   Specimen Description BLOOD [place]  Final   Special Requests   Final    BOTTLES DRAWN AEROBIC AND ANAEROBIC Blood Culture adequate volume   Culture   Final    NO GROWTH < 24 HOURS Performed at [facility], [address], [place], [place] [address]    Report Status PENDING  Incomplete      Radiology Studies: US RENAL  Result Date: 06/22/2020 CLINICAL DATA:  Inpatient.  Acute kidney injury.  Urinary debris. EXAM: RENAL / URINARY TRACT ULTRASOUND COMPLETE COMPARISON:  06/20/2020 renal sonogram. FINDINGS: Right Kidney: Renal measurements: 12.7 x 6.9 x 7.8 cm = volume: 361 mL. Echogenic right renal parenchyma, normal thickness. Proximal portion of right nephroureteral stent seen in the upper right renal collecting system. No right hydronephrosis. No right renal mass. Left Kidney: Renal measurements: 11.2 x 5.7 x 7.8 cm =  volume: 177 mL. Echogenic left renal parenchyma, normal thickness. No left hydronephrosis. No left renal mass. Bladder: Bladder partially collapsed by indwelling Foley catheter. Other: Small amount of free fluid in Morison's pouch and in the left perinephric space. IMPRESSION: 1. No hydronephrosis. 2. Echogenic kidneys, compatible with nonspecific acute renal parenchymal disease. 3. Small amount of free fluid in Morison's pouch and in the left perinephric space. Electronically Signed   By: Ilona Sorrel M.D.   On: 06/22/2020 10:55   US RENAL  Result Date: 06/20/2020 CLINICAL DATA:  Acute renal insufficiency. EXAM: RENAL / URINARY TRACT ULTRASOUND COMPLETE COMPARISON:  None. FINDINGS: Right Kidney: Renal measurements: 13.8 cm x 6.4 cm x 7.6 cm = volume: 350 mL. Diffusely increased echogenicity of the renal parenchyma is seen. No mass or hydronephrosis visualized. Left Kidney: Renal measurements: 12.0 cm x 5.5 cm x 5.4 cm = volume: 188 mL. Diffusely increased echogenicity of the renal parenchyma is  seen. No mass or hydronephrosis visualized. Bladder: A Foley catheter is in place. Other: None. IMPRESSION: 1. Diffusely increased echogenicity of both kidneys, which may be secondary to medical renal disease. Electronically Signed   By: Virgina Norfolk M.D.   On: 06/20/2020 19:33     Scheduled Meds: . acidophilus  1 capsule Oral Daily  . apixaban  5 mg Oral BID  . vitamin C  500 mg Oral Daily  . brimonidine  1 drop Both Eyes BID  . Chlorhexidine Gluconate Cloth  6 each Topical Q0600  . diltiazem  120 mg Oral Daily  . insulin aspart  0-20 Units Subcutaneous TID AC & HS  . mouth rinse  15 mL Mouth Rinse BID  . metoprolol tartrate  100 mg Oral BID  . pantoprazole  40 mg Oral Daily  . rosuvastatin  10 mg Oral Daily  . sodium chloride flush  10-40 mL Intracatheter Q12H  . sodium chloride flush  3 mL Intravenous Q12H  . tamsulosin  0.4 mg Oral Daily  . vitamin B-12  1,000 mcg Oral Daily  . zinc sulfate  220 mg Oral Daily   Continuous Infusions: . sodium chloride 250 mL (06/22/20 0416)  . lactated ringers with kcl 75 mL/hr at 06/22/20 1331  . piperacillin-tazobactam (ZOSYN)  IV 3.375 g (06/22/20 0417)     LOS: 5 days     Enzo Bi, MD [facility] If 7PM-7AM, please contact night-coverage 06/22/2020, 2:33 PM

## 2020-06-22 NOTE — Progress Notes (Signed)
Pt transferred to med surg unit 1 C, room 122. Alert and oriented, denies pain, no acute distress noted. Report called to the receiving RN, Gerald Stabs B. Pt's husband notified.

## 2020-06-22 NOTE — Consult Note (Signed)
Pharmacy Antibiotic Note  Stephanie [name] is a 79 y.o. female admitted on 06/17/2020 with pyelonephritis (spike in fever while on Ceftriaxone). 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 BCID identified 4/4 bottles K pneumo and patient was on CTX. Patient spiked fevers while on CTX so abx were broadened to Zosyn on 9/19. BCx and UCx grew K. Pneumoniae and sensitive to CTX and Zosyn.  Pharmacy has been consulted for Zosyn dosing.  Day 6 total IV abx, WBC 20.4>13.5>12.9>14.8, last fever 9/19 @1600 , Scr 1.37>1.75>2.28>2.93>3.79>4.06  Plan: Continue Zosyn 3.375 g Q12H (extended infusion) due to CrCl <36ml/min. Will continue to monitor renal function and adjust when indicated  Height: 5\' 6"  (167.6 cm) Weight: 86.8 kg (191 lb 6.4 oz) IBW/kg (Calculated) : 59.3  Temp (24hrs), Avg:98.2 F (36.8 C), Min:97.9 F (36.6 C), Max:98.8 F (37.1 C)  Recent Labs  Lab 06/17/20 0357 06/17/20 0357 06/17/20 1101 06/17/20 1935 06/17/20 1938 06/17/20 2217 06/18/20 0809 06/19/20 0537 06/20/20 0841 06/21/20 0552 06/22/20 0543  WBC 20.4*   < >  --   --   --   --  13.7* 13.5* 12.4* 12.9* 14.8*  CREATININE 1.57*   < >  --   --    < >  --  1.75* 2.28* 2.93* 3.79* 4.06*  LATICACIDVEN 3.1*  --  2.5* 2.8*  --  1.6  --   --   --   --   --    < > = values in this interval not displayed.    Estimated Creatinine Clearance: 12.9 mL/min (A) (by C-G formula based on SCr of 4.06 mg/dL (H)).    Allergies  Allergen Reactions  . Ibuprofen Itching  . Indomethacin   . Infliximab   . Methotrexate Derivatives   . Moexipril   . Percocet [Oxycodone-Acetaminophen] Itching  . Naprosyn [Naproxen] Rash   Antibiotics this admission 9/17 azithromycin, cefepime, vancomycin x1 9/17 ceftriaxone >> 9/19 9/19 Zosyn >> 5 day course remdesivir starting 9/17  Dose adjustments this admission 9/20 Zosyn 3.375g EI q8hr changed to q12h d/t Crcl <33ml/min  Microbiology  9/17 Bcx: 4/4 bottles K pneumo 9/17 Ucx:  K pneumo  Thank you for allowing pharmacy to be a part of this patient's care.  Sherilyn Banker, PharmD Pharmacy Resident  06/22/2020 12:12 PM

## 2020-06-22 NOTE — Progress Notes (Signed)
Urology Inpatient Progress Note  Subjective: Afebrile, VSS. Creatinine continues to uptrend, today 4.06.  Nephrology consulted yesterday. WBC count up today, 14.8.  Admission blood and urine cultures finalized with ampicillin resistant Klebsiella pneumoniae; on antibiotics as below.  Repeat blood cultures from yesterday pending with no growth at <24 hours. She denies pain today.  Anti-infectives: Anti-infectives (From admission, onward)   Start     Dose/Rate Route Frequency Ordered Stop   06/20/20 1600  piperacillin-tazobactam (ZOSYN) IVPB 3.375 g        3.375 g 12.5 mL/hr over 240 Minutes Intravenous Every 12 hours 06/20/20 1217     06/19/20 2000  piperacillin-tazobactam (ZOSYN) IVPB 3.375 g  Status:  Discontinued        3.375 g 12.5 mL/hr over 240 Minutes Intravenous Every 8 hours 06/19/20 1915 06/20/20 1217   06/18/20 1800  cefTRIAXone (ROCEPHIN) 2 g in sodium chloride 0.9 % 100 mL IVPB  Status:  Discontinued        2 g 200 mL/hr over 30 Minutes Intravenous Every 24 hours 06/17/20 1545 06/17/20 1545   06/18/20 1000  remdesivir 100 mg in sodium chloride 0.9 % 100 mL IVPB       "Followed by" Linked Group Details   100 mg 200 mL/hr over 30 Minutes Intravenous Daily 06/17/20 0809 06/21/20 0954   06/18/20 0800  azithromycin (ZITHROMAX) 500 mg in sodium chloride 0.9 % 250 mL IVPB  Status:  Discontinued        500 mg 250 mL/hr over 60 Minutes Intravenous Every 24 hours 06/17/20 0805 06/17/20 0914   06/17/20 1800  cefTRIAXone (ROCEPHIN) 2 g in sodium chloride 0.9 % 100 mL IVPB  Status:  Discontinued        2 g 200 mL/hr over 30 Minutes Intravenous Every 24 hours 06/17/20 1545 06/19/20 1915   06/17/20 1500  cefTRIAXone (ROCEPHIN) 2 g in sodium chloride 0.9 % 100 mL IVPB  Status:  Discontinued        2 g 200 mL/hr over 30 Minutes Intravenous Every 24 hours 06/17/20 0805 06/17/20 1545   06/17/20 1000  remdesivir 200 mg in sodium chloride 0.9% 250 mL IVPB       "Followed by" Linked Group  Details   200 mg 580 mL/hr over 30 Minutes Intravenous Once 06/17/20 0809 06/17/20 1216   06/17/20 0530  ceFEPIme (MAXIPIME) 1 g in sodium chloride 0.9 % 100 mL IVPB        1 g 200 mL/hr over 30 Minutes Intravenous  Once 06/17/20 0515 06/17/20 0810   06/17/20 0530  vancomycin (VANCOCIN) IVPB 1000 mg/200 mL premix        1,000 mg 200 mL/hr over 60 Minutes Intravenous  Once 06/17/20 0515 06/17/20 0929   06/17/20 0530  azithromycin (ZITHROMAX) tablet 500 mg        500 mg Oral  Once 06/17/20 0515 06/17/20 0723      Current Facility-Administered Medications  Medication Dose Route Frequency Provider Last Rate Last Admin  . 0.9 %  sodium chloride infusion  250 mL Intravenous PRN Irine Seal, MD 10 mL/hr at 06/22/20 0416 250 mL at 06/22/20 0416  . 0.9 %  sodium chloride infusion   Intravenous Continuous Enzo Bi, MD 100 mL/hr at 06/22/20 0123 New Bag at 06/22/20 0123  . acetaminophen (TYLENOL) tablet 650 mg  650 mg Oral Q6H PRN Irine Seal, MD   650 mg at 06/21/20 0426  . acidophilus (RISAQUAD) capsule 1 capsule  1 capsule Oral Daily Jeffie Pollock,  Jenny Reichmann, MD   1 capsule at 06/22/20 [phone]  . ascorbic acid (VITAMIN C) tablet 500 mg  500 mg Oral Daily Irine Seal, MD   500 mg at 06/22/20 0093  . brimonidine (ALPHAGAN) 0.2 % ophthalmic solution 1 drop  1 drop Both Eyes BID Irine Seal, MD   1 drop at 06/22/20 0837  . Chlorhexidine Gluconate Cloth 2 % PADS 6 each  6 each Topical Q0600 Irine Seal, MD   6 each at 06/21/20 1234  . diltiazem (CARDIZEM CD) 24 hr capsule 120 mg  120 mg Oral Daily Agbor-Etang, Brian, MD      . guaiFENesin-dextromethorphan (ROBITUSSIN DM) 100-10 MG/5ML syrup 10 mL  10 mL Oral Q4H PRN Irine Seal, MD   10 mL at 06/20/20 0430  . heparin ADULT infusion 100 units/mL (25000 units/264mL sodium chloride 0.45%)  1,050 Units/hr Intravenous Continuous Benita Gutter, RPH      . insulin aspart (novoLOG) injection 0-20 Units  0-20 Units Subcutaneous TID AC & HS Irine Seal, MD   4 Units at  06/21/20 2127  . MEDLINE mouth rinse  15 mL Mouth Rinse BID Irine Seal, MD   15 mL at 06/22/20 0837  . metoprolol tartrate (LOPRESSOR) tablet 100 mg  100 mg Oral BID Kate Sable, MD      . ondansetron [facility]) injection 4 mg  4 mg Intravenous Q6H PRN Irine Seal, MD   4 mg at 06/22/20 8182  . pantoprazole (PROTONIX) EC tablet 40 mg  40 mg Oral Daily Irine Seal, MD   40 mg at 06/22/20 9937  . piperacillin-tazobactam (ZOSYN) IVPB 3.375 g  3.375 g Intravenous Q12H Rauer, Kelcy Baeten O, RPH 12.5 mL/hr at 06/22/20 0417 3.375 g at 06/22/20 0417  . rosuvastatin (CRESTOR) tablet 10 mg  10 mg Oral Daily Irine Seal, MD   10 mg at 06/22/20 1696  . sodium chloride flush (NS) 0.9 % injection 10-40 mL  10-40 mL Intracatheter Q12H Enzo Bi, MD   10 mL at 06/22/20 0836  . sodium chloride flush (NS) 0.9 % injection 10-40 mL  10-40 mL Intracatheter PRN Enzo Bi, MD      . sodium chloride flush (NS) 0.9 % injection 3 mL  3 mL Intravenous Q12H Irine Seal, MD   3 mL at 06/22/20 0836  . sodium chloride flush (NS) 0.9 % injection 3 mL  3 mL Intravenous PRN Irine Seal, MD   3 mL at 06/22/20 0422  . tamsulosin (FLOMAX) capsule 0.4 mg  0.4 mg Oral Daily Irine Seal, MD   0.4 mg at 06/22/20 7893  . vitamin B-12 (CYANOCOBALAMIN) tablet 1,000 mcg  1,000 mcg Oral Daily Irine Seal, MD   1,000 mcg at 06/22/20 [phone]  . zinc sulfate capsule 220 mg  220 mg Oral Daily Irine Seal, MD   220 mg at 06/22/20 7510   Objective: Vital signs in last 24 hours: Temp:  [97.9 F (36.6 C)-98.9 F (37.2 C)] 98 F (36.7 C) (09/22 0800) Pulse Rate:  [85-96] 87 (09/22 0800) Resp:  [19-36] 36 (09/22 0800) BP: (101-125)/(71-79) 114/76 (09/22 0800) SpO2:  [93 %-98 %] 95 % (09/22 0800) Weight:  [86.8 kg] 86.8 kg (09/22 0453)  Intake/Output from previous day: 09/21 0701 - 09/22 0700 In: 840 [P.O.:840] Out: 1150 [Urine:1150] Intake/Output this shift: No intake/output data recorded.  Physical Exam Vitals reviewed.   Constitutional:      General: She is not in acute distress.    Appearance: She is not ill-appearing, toxic-appearing or diaphoretic.  HENT:     Head: Normocephalic and atraumatic.  Skin:    General: Skin is warm and dry.  Neurological:     Mental Status: She is alert and oriented to person, place, and time.  Psychiatric:        Mood and Affect: Mood normal.        Behavior: Behavior normal.    Lab Results:  Recent Labs    06/21/20 0552 06/22/20 0543  WBC 12.9* 14.8*  HGB 10.2* 10.1*  HCT 29.3* 29.7*  PLT 93* 105*   BMET Recent Labs    06/21/20 0552 06/22/20 0543  NA 128* 128*  K 3.2* 3.5  CL 99 100  CO2 16* 14*  GLUCOSE 158* 131*  BUN 43* 54*  CREATININE 3.79* 4.06*  CALCIUM 6.8* 6.5*   Assessment & Plan: 79 year old female with recurrent UTI, right UPJ obstruction, and urinary retention managed with [facility] admitted with COVID-19 infection, sepsis, AKI with ATN, UTI with worsening right hydronephrosis, now s/p right ureteral stent placement with intraoperative findings of right pyonephrosis.  On culture appropriate antibiotics.  Leukocytosis and renal function worsening today, however patient denies flank pain.  Most recent renal ultrasound reassuring 2 days ago, however given the amount of debris noted on ureteroscopy and imaging, I recommend repeat renal ultrasound today to rule out occluded ureteral stent as contributory to her rising creatinine.  If no hydronephrosis, will defer to nephrology for management of AKI with ATN.  Debroah Loop, PA-C 06/22/2020

## 2020-06-22 NOTE — Progress Notes (Addendum)
[facility]      Date: 06/22/2020                  Patient Name:  Stephanie [name]  MRN: [id number]  DOB: 1941/01/21  Age / Sex: 79 y.o., female                                                          Ms. JULIANY DAUGHETY admitted to [facility] for COVID pneumonia. Patient found to have klebsiella sepsis and had right sided pyelonephritis. She underwent right ureteral stent placed on on 9/18.  Patient had IV contrast exposure on 9/17.  Nephrology consulted for worsening kidney function.   Update  Patient resting in bed, in acute distress. Denies SOB,nausea and vomiting.   Vital Signs: Blood pressure (!) 113/98, pulse 91, temperature 98 F (36.7 C), temperature source Oral, resp. rate 20, height 5\' 6"  (1.676 m), weight 86.8 kg, SpO2 96 %.  Weight trends: Filed Weights   06/19/20 0625 06/21/20 0125 06/22/20 0453  Weight: 80.4 kg 82.2 kg 86.8 kg    Physical Exam: General: No acute distress, alert,awake  Head: Normocephalic, atraumatic. Moist oral mucosal membranes  Eyes: Sclerae and conjunctivae clear  Neck: Supple, trachea at midline  Lungs:  Clear to auscultation  Heart: S1  S2 + Regular  Abdomen:  + mild distension  Extremities: no peripheral edema.  Neurologic: Nonfocal, moving all four extremities  Skin: No lesions        Lab results: Basic Metabolic Panel: Recent Labs  Lab 06/17/20 1935 06/17/20 1938 06/18/20 0809 06/19/20 0537 06/20/20 0841 06/21/20 0552 06/22/20 0543  NA  --    < > 135   < > 131* 128* 128*  K  --    < > 3.3*   < > 2.6* 3.2* 3.5  CL  --    < > 106   < > 99 99 100  CO2  --    < > 20*   < > 19* 16* 14*  GLUCOSE  --    < > 97   < > 155* 158* 131*  BUN  --    < > 28*   < > 33* 43* 54*  CREATININE  --    < > 1.75*   < > 2.93* 3.79* 4.06*  CALCIUM  --    < > 8.1*   < > 7.0* 6.8* 6.5*  MG 1.8  --  1.8  --  1.7 1.7 1.7  PHOS  --   --  2.9  --   --   --   --    < > = values in this interval not displayed.    Liver  Function Tests: Recent Labs  Lab 06/17/20 0357 06/18/20 0809 06/19/20 0537  AST 27 26 30   ALT 21 15 15   ALKPHOS 83 84 110  BILITOT 1.0 0.6 0.5  PROT 6.2* 5.3* 5.9*  ALBUMIN 3.0* 2.2* 2.3*   No results for input(s): LIPASE, AMYLASE in the last 168 hours. No results for input(s): AMMONIA in the last 168 hours.  CBC: Recent Labs  Lab 06/17/20 0357 06/17/20 0357 06/18/20 0809 06/18/20 0809 06/19/20 0537 06/19/20 0537 06/20/20 0841 06/21/20 0552 06/22/20 0543  WBC 20.4*   < > 13.7*   < > 13.5*   < >  12.4* 12.9* 14.8*  NEUTROABS 17.8*  --  10.7*  --  11.8*  --   --   --   --   HGB 11.8*   < > 11.1*   < > 10.5*   < > 10.4* 10.2* 10.1*  HCT 34.2*   < > 31.5*   < > 31.2*   < > 29.6* 29.3* 29.7*  MCV 82.4   < > 80.6  --  83.6  --  80.2 80.3 80.5  PLT 144*   < > 97*   < > 96*   < > 94* 93* 105*   < > = values in this interval not displayed.    Cardiac Enzymes: No results for input(s): CKTOTAL, CKMB, CKMBINDEX, TROPONINI in the last 168 hours.  BNP: Invalid input(s): POCBNP  CBG: Recent Labs  Lab 06/21/20 1208 06/21/20 1724 06/21/20 2042 06/22/20 0744 06/22/20 1149  GLUCAP 226* 150* 153* 117* 147*    Microbiology: Results for orders placed or performed during the hospital encounter of 06/17/20  Blood culture (routine x 2)     Status: Abnormal   Collection Time: 06/17/20  3:57 AM   Specimen: BLOOD  Result Value Ref Range Status   Specimen Description   Final    BLOOD LEFT HAND Performed at [facility], [address] [place], [place] [address]    Special Requests   Final    BOTTLES DRAWN AEROBIC AND ANAEROBIC Blood Culture adequate volume Performed at [facility], [address] [place], [place] [address]    Culture  Setup Time   Final    Organism ID to follow GRAM NEGATIVE RODS IN BOTH AEROBIC AND ANAEROBIC BOTTLES CRITICAL RESULT CALLED TO, READ BACK BY AND VERIFIED WITH: AMY THOMPSON AT 1534 ON 06/17/20 [place] Performed at [facility], [address] [place], [place] [address]    Culture KLEBSIELLA PNEUMONIAE (A)  Final   Report Status 06/19/2020 FINAL  Final   Organism ID, Bacteria KLEBSIELLA PNEUMONIAE  Final      Susceptibility   Klebsiella pneumoniae - MIC*    AMPICILLIN RESISTANT Resistant     CEFAZOLIN <=4 SENSITIVE Sensitive     CEFEPIME <=0.12 SENSITIVE Sensitive     CEFTAZIDIME <=1 SENSITIVE Sensitive     CEFTRIAXONE <=0.25 SENSITIVE Sensitive     CIPROFLOXACIN <=0.25 SENSITIVE Sensitive     GENTAMICIN <=1 SENSITIVE Sensitive     IMIPENEM <=0.25 SENSITIVE Sensitive     TRIMETH/SULFA <=20 SENSITIVE Sensitive     AMPICILLIN/SULBACTAM 4 SENSITIVE Sensitive     PIP/TAZO <=4 SENSITIVE Sensitive     * KLEBSIELLA PNEUMONIAE  Blood culture (routine x 2)     Status: Abnormal   Collection Time: 06/17/20  3:57 AM   Specimen: BLOOD  Result Value Ref Range Status   Specimen Description   Final    BLOOD RIGHT [facility] Performed at [facility], [address] [place], [place] [address]    Special Requests   Final    BOTTLES DRAWN AEROBIC AND ANAEROBIC Blood Culture adequate volume Performed at [facility], [address] [place], [place] [address]    Culture  Setup Time   Final    GRAM NEGATIVE RODS IN BOTH AEROBIC AND ANAEROBIC BOTTLES CRITICAL VALUE NOTED.  VALUE IS CONSISTENT WITH PREVIOUSLY REPORTED AND CALLED VALUE. Performed at [facility], [address] [place], [place] [address]    Culture (A)  Final    KLEBSIELLA PNEUMONIAE SUSCEPTIBILITIES PERFORMED ON  PREVIOUS CULTURE WITHIN THE LAST 5 DAYS. Performed at [facility], [place] [address], [place], [place] [address]    Report Status 06/19/2020 FINAL  Final  Blood Culture ID Panel (Reflexed)     Status: Abnormal   Collection Time: 06/17/20  3:57 AM  Result Value Ref Range Status   Enterococcus faecalis NOT DETECTED NOT DETECTED Final   Enterococcus Faecium NOT DETECTED NOT DETECTED Final   Listeria  monocytogenes NOT DETECTED NOT DETECTED Final   Staphylococcus species NOT DETECTED NOT DETECTED Final   Staphylococcus aureus (BCID) NOT DETECTED NOT DETECTED Final   Staphylococcus epidermidis NOT DETECTED NOT DETECTED Final   Staphylococcus lugdunensis NOT DETECTED NOT DETECTED Final   Streptococcus species NOT DETECTED NOT DETECTED Final   Streptococcus agalactiae NOT DETECTED NOT DETECTED Final   Streptococcus pneumoniae NOT DETECTED NOT DETECTED Final   Streptococcus pyogenes NOT DETECTED NOT DETECTED Final   A.calcoaceticus-baumannii NOT DETECTED NOT DETECTED Final   Bacteroides fragilis NOT DETECTED NOT DETECTED Final   Enterobacterales DETECTED (A) NOT DETECTED Final    Comment: Enterobacterales represent a large order of gram negative bacteria, not a single organism. CRITICAL RESULT CALLED TO, READ BACK BY AND VERIFIED WITH: AMY THOMPSON AT 8366 ON 06/17/20 SNG    Enterobacter cloacae complex NOT DETECTED NOT DETECTED Final   Escherichia coli NOT DETECTED NOT DETECTED Final   Klebsiella aerogenes NOT DETECTED NOT DETECTED Final   Klebsiella oxytoca NOT DETECTED NOT DETECTED Final   Klebsiella pneumoniae DETECTED (A) NOT DETECTED Final    Comment: CRITICAL RESULT CALLED TO, READ BACK BY AND VERIFIED WITH: AMY THOMPSON RN AT 2947 ON 06/17/20 SNG    Proteus species NOT DETECTED NOT DETECTED Final   Salmonella species NOT DETECTED NOT DETECTED Final   Serratia marcescens NOT DETECTED NOT DETECTED Final   Haemophilus influenzae NOT DETECTED NOT DETECTED Final   Neisseria meningitidis NOT DETECTED NOT DETECTED Final   Pseudomonas aeruginosa NOT DETECTED NOT DETECTED Final   Stenotrophomonas maltophilia NOT DETECTED NOT DETECTED Final   Candida albicans NOT DETECTED NOT DETECTED Final   Candida auris NOT DETECTED NOT DETECTED Final   Candida glabrata NOT DETECTED NOT DETECTED Final   Candida krusei NOT DETECTED NOT DETECTED Final   Candida parapsilosis NOT DETECTED NOT DETECTED  Final   Candida tropicalis NOT DETECTED NOT DETECTED Final   Cryptococcus neoformans/gattii NOT DETECTED NOT DETECTED Final   CTX-M ESBL NOT DETECTED NOT DETECTED Final   Carbapenem resistance IMP NOT DETECTED NOT DETECTED Final   Carbapenem resistance KPC NOT DETECTED NOT DETECTED Final   Carbapenem resistance NDM NOT DETECTED NOT DETECTED Final   Carbapenem resist OXA 48 LIKE NOT DETECTED NOT DETECTED Final   Carbapenem resistance VIM NOT DETECTED NOT DETECTED Final    Comment: Performed at [facility], [address] [place], [place] [address]  Urine Culture     Status: Abnormal   Collection Time: 06/17/20 11:01 AM   Specimen: Urine, Random  Result Value Ref Range Status   Specimen Description   Final    URINE, RANDOM Performed at [facility], [address], [place], [place] [address]    Special Requests   Final    NONE Performed at [facility], [address] [place], [place] [address]    Culture >=100,000 COLONIES/mL KLEBSIELLA PNEUMONIAE (A)  Final   Report Status 06/19/2020 FINAL  Final   Organism ID, Bacteria KLEBSIELLA PNEUMONIAE (A)  Final      Susceptibility  Klebsiella pneumoniae - MIC*    AMPICILLIN RESISTANT Resistant     CEFAZOLIN <=4 SENSITIVE Sensitive     CEFTRIAXONE <=0.25 SENSITIVE Sensitive     CIPROFLOXACIN <=0.25 SENSITIVE Sensitive     GENTAMICIN <=1 SENSITIVE Sensitive     IMIPENEM <=0.25 SENSITIVE Sensitive     NITROFURANTOIN 32 SENSITIVE Sensitive     TRIMETH/SULFA <=20 SENSITIVE Sensitive     AMPICILLIN/SULBACTAM 4 SENSITIVE Sensitive     PIP/TAZO <=4 SENSITIVE Sensitive     * >=100,000 COLONIES/mL KLEBSIELLA PNEUMONIAE  Culture, blood (Routine X 2) w Reflex to ID Panel     Status: None (Preliminary result)   Collection Time: 06/21/20 11:07 AM   Specimen: BLOOD  Result Value Ref Range Status   Specimen Description BLOOD RIGHT [facility]  Final   Special Requests   Final    BOTTLES DRAWN AEROBIC AND ANAEROBIC Blood  Culture adequate volume   Culture   Final    NO GROWTH < 24 HOURS Performed at [facility], [address], [place], [place] [address]    Report Status PENDING  Incomplete  Culture, blood (Routine X 2) w Reflex to ID Panel     Status: None (Preliminary result)   Collection Time: 06/21/20 11:09 AM   Specimen: BLOOD  Result Value Ref Range Status   Specimen Description BLOOD [place]  Final   Special Requests   Final    BOTTLES DRAWN AEROBIC AND ANAEROBIC Blood Culture adequate volume   Culture   Final    NO GROWTH < 24 HOURS Performed at [facility], [address] [place], [place] [address]    Report Status PENDING  Incomplete    Coagulation Studies: No results for input(s): LABPROT, INR in the last 72 hours.  Urinalysis: No results for input(s): COLORURINE, LABSPEC, PHURINE, GLUCOSEU, HGBUR, BILIRUBINUR, KETONESUR, PROTEINUR, UROBILINOGEN, NITRITE, LEUKOCYTESUR in the last 72 hours.  Invalid input(s): APPERANCEUR    Imaging: US RENAL  Result Date: 06/22/2020 CLINICAL DATA:  Inpatient.  Acute kidney injury.  Urinary debris. EXAM: RENAL / URINARY TRACT ULTRASOUND COMPLETE COMPARISON:  06/20/2020 renal sonogram. FINDINGS: Right Kidney: Renal measurements: 12.7 x 6.9 x 7.8 cm = volume: 361 mL. Echogenic right renal parenchyma, normal thickness. Proximal portion of right nephroureteral stent seen in the upper right renal collecting system. No right hydronephrosis. No right renal mass. Left Kidney: Renal measurements: 11.2 x 5.7 x 7.8 cm = volume: 177 mL. Echogenic left renal parenchyma, normal thickness. No left hydronephrosis. No left renal mass. Bladder: Bladder partially collapsed by indwelling Foley catheter. Other: Small amount of free fluid in Morison's pouch and in the left perinephric space. IMPRESSION: 1. No hydronephrosis. 2. Echogenic kidneys, compatible with nonspecific acute renal parenchymal disease. 3. Small amount of free fluid in Morison's pouch and  in the left perinephric space. Electronically Signed   By: Ilona Sorrel M.D.   On: 06/22/2020 10:55   US RENAL  Result Date: 06/20/2020 CLINICAL DATA:  Acute renal insufficiency. EXAM: RENAL / URINARY TRACT ULTRASOUND COMPLETE COMPARISON:  None. FINDINGS: Right Kidney: Renal measurements: 13.8 cm x 6.4 cm x 7.6 cm = volume: 350 mL. Diffusely increased echogenicity of the renal parenchyma is seen. No mass or hydronephrosis visualized. Left Kidney: Renal measurements: 12.0 cm x 5.5 cm x 5.4 cm = volume: 188 mL. Diffusely increased echogenicity of the renal parenchyma is seen. No mass or hydronephrosis visualized. Bladder: A Foley catheter is in place. Other: None. IMPRESSION: 1. Diffusely increased echogenicity of both kidneys,  which may be secondary to medical renal disease. Electronically Signed   By: Virgina Norfolk M.D.   On: 06/20/2020 19:33     Assessment & Plan: Ms. GUENEVERE ROORDA is a 78 y.o. whtie female with hypertension, hyperlipdemia, GERD, glaucoma, diabetes mellitus type II, history of colon cancer, who was admitted to [facility] on 06/17/2020 for CAP (community acquired pneumonia) [J18.9] AKI (acute kidney injury) ([place]) [N17.9] Paroxysmal SVT (supraventricular tachycardia) ([place]) [I47.1] Sepsis ([place]) [A41.9] Sepsis with acute renal failure without septic shock, due to unspecified organism, unspecified acute renal failure type ([place]) [A41.9, R65.20, N17.9] COVID-19 [U07.1]  1. Acute renal failure:  Baseline creatinine of 0.9 with normal GFR on 10/16/2019.  Acute renal failure secondary to ATN with sepsis, obstructive uropathy, IV contrast nephropathy and prolonged prerenal azotemia.   Creatinine 4.06, slightly elevated than yesterday,will continue monitoring closely Dialysis not indicated at this time Will change IV Fluid to LR 75 ml/hr  2. Obstructive uropathy with pyelonephritis with klebsiella. Status post right ureteral stent placement by Dr. Jeffie Pollock on 9/18. Getting treated with  antibiotics [facility] following up  3. Hyponatremia:  Chronic hyponatremia can be attributed to her hydrochlorothiazide Na+ 128 today  4. Hypokalemia Improving, K+ 3.5 today Will start on IVF LR with K+  5. Metabolic acidosis Changed IVF today  6. Anemia with renal failure:  Hgb  10.1 today  Will continue monitoring CBCs   LOS: 5 Princy Raju 9/22/202111:59 AM   Patient was seen and examined with Crosby Oyster, DNP. Additionally changed fluids to LR with potassium to help improve serum electrolytes. Above plan was discussed and agreed upon on the signing of this note.   Lavonia Dana, [facility]  9/22/20211:03 PM

## 2020-06-22 NOTE — Progress Notes (Signed)
Progress Note  Patient Name: Stephanie [name] Date of Encounter: 06/22/2020  [facility] Cardiologist: [facility] rounding  Subjective   Feels okay, denies chest pain or palpitations.  Was nauseous before due to eating too much.  Denies back pain.  Inpatient Medications    Scheduled Meds: . acidophilus  1 capsule Oral Daily  . apixaban  5 mg Oral BID  . vitamin C  500 mg Oral Daily  . brimonidine  1 drop Both Eyes BID  . Chlorhexidine Gluconate Cloth  6 each Topical Q0600  . diltiazem  120 mg Oral Daily  . insulin aspart  0-20 Units Subcutaneous TID AC & HS  . mouth rinse  15 mL Mouth Rinse BID  . metoprolol tartrate  100 mg Oral BID  . pantoprazole  40 mg Oral Daily  . potassium chloride  40 mEq Oral Once  . rosuvastatin  10 mg Oral Daily  . sodium chloride flush  10-40 mL Intracatheter Q12H  . sodium chloride flush  3 mL Intravenous Q12H  . tamsulosin  0.4 mg Oral Daily  . vitamin B-12  1,000 mcg Oral Daily  . zinc sulfate  220 mg Oral Daily   Continuous Infusions: . sodium chloride 250 mL (06/22/20 0416)  . sodium chloride 100 mL/hr at 06/22/20 0123  . piperacillin-tazobactam (ZOSYN)  IV 3.375 g (06/22/20 0417)   PRN Meds: sodium chloride, acetaminophen, guaiFENesin-dextromethorphan, ondansetron (ZOFRAN) IV, sodium chloride flush, sodium chloride flush   Vital Signs    Vitals:   06/21/20 2044 06/22/20 0453 06/22/20 0746 06/22/20 0800  BP: 122/72 123/71 125/79 114/76  Pulse:  86 89 87  Resp: 19 (!) 29 (!) 27 (!) 36  Temp: 98.2 F (36.8 C) 98.4 F (36.9 C) 97.9 F (36.6 C) 98 F (36.7 C)  TempSrc: Oral Oral Oral   SpO2: 97% 97% 98% 95%  Weight:  86.8 kg    Height:        Intake/Output Summary (Last 24 hours) at 06/22/2020 0935 Last data filed at 06/21/2020 1816 Gross per 24 hour  Intake 840 ml  Output 1150 ml  Net -310 ml   Last 3 Weights 06/22/2020 06/21/2020 06/19/2020  Weight (lbs) 191 lb 6.4 oz 181 lb 3.2 oz 177 lb 4 oz  Weight (kg) 86.818  kg 82.192 kg 80.4 kg      Telemetry    Atrial fibrillation heart rate 87- Personally Reviewed  ECG    New tracing obtained- Personally Reviewed  Physical Exam   GEN: No acute distress, looks tired, soft-spoken.   Neck: No JVD Cardiac:  Irregular irregular.  Respiratory:  Poor inspiratory effort GI: Soft, nontender, non-distended  MS: No edema; No deformity. Neuro:  Nonfocal  Psych: Normal affect   Labs    High Sensitivity Troponin:   Recent Labs  Lab 06/17/20 0357 06/17/20 1101  TROPONINIHS 61* 71*      Chemistry Recent Labs  Lab 06/17/20 0357 06/17/20 1938 06/18/20 0809 06/18/20 0809 06/19/20 0537 06/19/20 0537 06/20/20 0841 06/21/20 0552 06/22/20 0543  NA 133*   < > 135   < > 134*   < > 131* 128* 128*  K 3.9   < > 3.3*   < > 3.6   < > 2.6* 3.2* 3.5  CL 102   < > 106   < > 101   < > 99 99 100  CO2 18*   < > 20*   < > 21*   < > 19* 16*  14*  GLUCOSE 344*   < > 97   < > 140*   < > 155* 158* 131*  BUN 38*   < > 28*   < > 30*   < > 33* 43* 54*  CREATININE 1.57*   < > 1.75*   < > 2.28*   < > 2.93* 3.79* 4.06*  CALCIUM 8.9   < > 8.1*   < > 7.8*   < > 7.0* 6.8* 6.5*  PROT 6.2*  --  5.3*  --  5.9*  --   --   --   --   ALBUMIN 3.0*  --  2.2*  --  2.3*  --   --   --   --   AST 27  --  26  --  30  --   --   --   --   ALT 21  --  15  --  15  --   --   --   --   ALKPHOS 83  --  84  --  110  --   --   --   --   BILITOT 1.0  --  0.6  --  0.5  --   --   --   --   GFRNONAA 31*   < > 28*   < > 20*   < > 15* 11* 10*  GFRAA 36*   < > 32*   < > 23*   < > 17* 13* 12*  ANIONGAP 13   < > 9   < > 12   < > 13 13 14    < > = values in this interval not displayed.     Hematology Recent Labs  Lab 06/20/20 0841 06/21/20 0552 06/22/20 0543  WBC 12.4* 12.9* 14.8*  RBC 3.69* 3.65* 3.69*  HGB 10.4* 10.2* 10.1*  HCT 29.6* 29.3* 29.7*  MCV 80.2 80.3 80.5  MCH 28.2 27.9 27.4  MCHC 35.1 34.8 34.0  RDW 14.9 15.5 15.9*  PLT 94* 93* 105*    BNP Recent Labs  Lab 06/18/20 0809   BNP 410.1*     DDimer No results for input(s): DDIMER in the last 168 hours.   Radiology    US RENAL  Result Date: 06/20/2020 CLINICAL DATA:  Acute renal insufficiency. EXAM: RENAL / URINARY TRACT ULTRASOUND COMPLETE COMPARISON:  None. FINDINGS: Right Kidney: Renal measurements: 13.8 cm x 6.4 cm x 7.6 cm = volume: 350 mL. Diffusely increased echogenicity of the renal parenchyma is seen. No mass or hydronephrosis visualized. Left Kidney: Renal measurements: 12.0 cm x 5.5 cm x 5.4 cm = volume: 188 mL. Diffusely increased echogenicity of the renal parenchyma is seen. No mass or hydronephrosis visualized. Bladder: A Foley catheter is in place. Other: None. IMPRESSION: 1. Diffusely increased echogenicity of both kidneys, which may be secondary to medical renal disease. Electronically Signed   By: Virgina Norfolk M.D.   On: 06/20/2020 19:33    Cardiac Studies   Echocardiogram 06/2020 1. Left ventricular ejection fraction, by estimation, is 50 to 55%. The  left ventricle has low normal function. The left ventricle has no regional  wall motion abnormalities. There is mild left ventricular hypertrophy.  Indeterminate diastolic filling due  to E-A fusion.  2. Right ventricular systolic function is normal. The right ventricular  size is normal.  3. The mitral valve is normal in structure. No evidence of mitral valve  regurgitation. No evidence of mitral stenosis.  4. The aortic  valve is normal in structure. Aortic valve regurgitation is  not visualized. No aortic stenosis is present.   Patient Profile     79 y.o. female with history of hypertension, diabetes, hyperlipidemia, rheumatoid arthritis, chronic urinary retention requiring self-catheterization presenting with fevers chills, diagnosed with sepsis likely from urinary origin, underwent right renal stent placement due to obstruction.  Patient being seen for new onset atrial fibrillation with rapid ventricular response.  Assessment &  Plan    1.    Persistent atrial fibrillation -Heart rate controlled -CHA2DS2-VASc of 5 (htn, age, dm, gender) -Consolidate Lopressor 200 mg twice daily. -Consolidate Cardizem to 120 mg CD daily -Continue heparin due to rising creatinine.  Reached out to RN regarding not seeing IV heparin infusion during rounds today.  NOAC upon discharge after improvement in renal function  2.  Hypertension -BP controlled -lopressor and cardizem as above -stop hctz  3.  Hyperlipidemia -PTA Crestor  4. Septis, urinary obstruction -mgmt as per primary team, urology  5. aki -Etiology may be secondary to obstruction versus contrast versus sepsis. -Appreciate input from nephrology -If IV contrast, typically peaks today. -Continue to monitor, avoid nephrotoxic agents.  Greater than 50% was spent in counseling and coordination of care with patient Total encounter time 35 minutes       Signed, Kate Sable, MD  06/22/2020, 9:35 AM

## 2020-06-22 NOTE — Progress Notes (Signed)
PT Cancellation Note  Patient Details Name: Stephanie [name] MRN: [id number] DOB: 1941/03/15   Cancelled Treatment:    Reason Eval/Treat Not Completed: Medical issues which prohibited therapy (Chart reviewed, Pharm consulted. Pt being transitioned to a new anticoagulation this date, previously on [facility] for rate control. Will hold until HL shown to be in range.)  10:58 AM, 06/22/20 Etta Grandchild, PT, DPT Physical Therapist - [facility]  [phone] ([place])    Jamion Carter C 06/22/2020, 10:58 AM

## 2020-06-23 ENCOUNTER — Inpatient Hospital Stay: Payer: Medicare Other

## 2020-06-23 DIAGNOSIS — N170 Acute kidney failure with tubular necrosis: Secondary | ICD-10-CM

## 2020-06-23 LAB — BASIC METABOLIC PANEL
Anion gap: 14 (ref 5–15)
BUN: 65 mg/dL — ABNORMAL HIGH (ref 8–23)
CO2: 12 mmol/L — ABNORMAL LOW (ref 22–32)
Calcium: 6.4 mg/dL — CL (ref 8.9–10.3)
Chloride: 100 mmol/L (ref 98–111)
Creatinine, Ser: 4.77 mg/dL — ABNORMAL HIGH (ref 0.44–1.00)
GFR calc Af Amer: 10 mL/min — ABNORMAL LOW (ref >60)
GFR calc non Af Amer: 8 mL/min — ABNORMAL LOW (ref >60)
Glucose, Bld: 132 mg/dL — ABNORMAL HIGH (ref 70–99)
Potassium: 4.6 mmol/L (ref 3.5–5.1)
Sodium: 126 mmol/L — ABNORMAL LOW (ref 135–145)

## 2020-06-23 LAB — GLUCOSE, CAPILLARY
Glucose-Capillary: 135 mg/dL — ABNORMAL HIGH (ref 70–99)
Glucose-Capillary: 161 mg/dL — ABNORMAL HIGH (ref 70–99)
Glucose-Capillary: 134 mg/dL — ABNORMAL HIGH (ref 70–99)
Glucose-Capillary: 120 mg/dL — ABNORMAL HIGH (ref 70–99)

## 2020-06-23 LAB — CBC
HCT: 28.4 % — ABNORMAL LOW (ref 36.0–46.0)
Hemoglobin: 9.9 g/dL — ABNORMAL LOW (ref 12.0–15.0)
MCH: 27 pg (ref 26.0–34.0)
MCHC: 34.9 g/dL (ref 30.0–36.0)
MCV: 77.6 fL — ABNORMAL LOW (ref 80.0–100.0)
Platelets: 153 10*3/uL (ref 150–400)
RBC: 3.66 MIL/uL — ABNORMAL LOW (ref 3.87–5.11)
RDW: 16.6 % — ABNORMAL HIGH (ref 11.5–15.5)
WBC: 13.3 10*3/uL — ABNORMAL HIGH (ref 4.0–10.5)
nRBC: 0 % (ref 0.0–0.2)

## 2020-06-23 LAB — ALBUMIN: Albumin: 1.7 g/dL — ABNORMAL LOW (ref 3.5–5.0)

## 2020-06-23 LAB — MAGNESIUM: Magnesium: 1.7 mg/dL (ref 1.7–2.4)

## 2020-06-23 MED ORDER — APIXABAN 2.5 MG PO TABS
2.5000 mg | ORAL_TABLET | Freq: Two times a day (BID) | ORAL | Status: DC
  Administered 2020-06-23 – 2020-06-24 (×2): 2.5 mg via ORAL
  Filled 2020-06-23 (×2): qty 1

## 2020-06-23 MED ORDER — SODIUM BICARBONATE 8.4 % IV SOLN
INTRAVENOUS | Status: DC
  Filled 2020-06-23: qty 150
  Filled 2020-06-23: qty 850

## 2020-06-23 MED ORDER — CIPROFLOXACIN HCL 500 MG PO TABS
500.0000 mg | ORAL_TABLET | Freq: Every day | ORAL | Status: DC
  Administered 2020-06-23 – 2020-06-24 (×2): 500 mg via ORAL
  Filled 2020-06-23 (×2): qty 1

## 2020-06-23 MED ORDER — ENSURE MAX PROTEIN PO LIQD
11.0000 [oz_av] | Freq: Two times a day (BID) | ORAL | Status: DC
  Administered 2020-06-23: 11 [oz_av] via ORAL
  Filled 2020-06-23: qty 330

## 2020-06-23 MED ORDER — ALBUTEROL SULFATE HFA 108 (90 BASE) MCG/ACT IN AERS
2.0000 | INHALATION_SPRAY | RESPIRATORY_TRACT | Status: DC | PRN
  Filled 2020-06-23: qty 6.7

## 2020-06-23 MED ORDER — ENSURE MAX PROTEIN PO LIQD
11.0000 [oz_av] | Freq: Three times a day (TID) | ORAL | Status: DC
  Administered 2020-06-23: 11 [oz_av] via ORAL
  Filled 2020-06-23: qty 330

## 2020-06-23 NOTE — Evaluation (Signed)
Occupational Therapy Evaluation Patient Details Name: Stephanie [name] MRN: [id number] DOB: [birth year] Today's Date: 06/23/2020    History of Present Illness Stephanie [name] is a 65yoF c PMH: COVID infection, DM, RA, HTN, remote colon CA, who comes to [facility] on 9/17.   Clinical Impression   Patient presenting with decreased I in self care, balance, functional transfers/mobility, endurance, safety awareness, and strengthening. Patient reports being fully independent at baseline and living with husband PTA. Patient currently functioning at max A - total A for bed mobility. Pt sitting on EOB for ~7-8 minutes with mod - max A static sitting balance with pt reporting dizziness. Pt's BP in supine is 118/59 and after sitting 4 minutes on EOB it is 121/70. Pt refusing to attempt transfer and stand this session.Pt follows 1 step commands with increased time. Pt placed into chair position in bed. Patient will benefit from acute OT to increase overall independence in the areas of ADLs, functional mobility, and safety awareness in order to safely discharge to next venue of care.    Follow Up Recommendations  SNF;Supervision/Assistance - 24 hour    Equipment Recommendations  Other (comment) (defer to next venue of care)    Recommendations for Other Services Other (comment) (none at this time)     Precautions / Restrictions Precautions Precautions: Fall      Mobility Bed Mobility Overal bed mobility: Needs Assistance Bed Mobility: Sit to Supine;Supine to Sit     Supine to sit: Max assist Sit to supine: Total assist   General bed mobility comments: Pt reports feeling "dizzy and weak feeling" requiring increased assistance to sit on EOB.  Transfers      General transfer comment: Pt declined standing attempt    Balance Overall balance assessment: Needs assistance Sitting-balance support: Bilateral upper extremity supported;Feet supported Sitting balance-Leahy Scale: Zero       ADL either  performed or assessed with clinical judgement   ADL Overall ADL's : Needs assistance/impaired      General ADL Comments: Pt needs mod - max A for static sitting balance requiring increased assist in self care tasks.     Vision Baseline Vision/History: No visual deficits              Pertinent Vitals/Pain Pain Assessment: No/denies pain     Hand Dominance Right   Extremity/Trunk Assessment Upper Extremity Assessment Upper Extremity Assessment: Generalized weakness   Lower Extremity Assessment Lower Extremity Assessment: Generalized weakness       Communication Communication Communication: No difficulties   Cognition Arousal/Alertness: Awake/alert Behavior During Therapy: WFL for tasks assessed/performed Overall Cognitive Status: Within Functional Limits for tasks assessed                 Home Living Family/patient expects to be discharged to:: Private residence Living Arrangements: Spouse/significant other Available Help at Discharge: Family Type of Home: House Home Access: Stairs to enter [employer] of Steps: 1 partial step   Home Layout: One level    Home Equipment: Environmental consultant - 4 wheels;Walker - 2 wheels;Wheelchair - manual          Prior Functioning/Environment Level of Independence: Independent with assistive device(s)        Comments: 4WW in house, primarily a [place] ambulator, no falls history in 6 months. Independent c ADL        OT Problem List: Decreased strength;Decreased coordination;Decreased range of motion;Decreased activity tolerance;Decreased safety awareness;Decreased knowledge of use of DME or AE;Impaired balance (sitting and/or standing);Cardiopulmonary status limiting activity;Decreased knowledge  of precautions      OT Treatment/Interventions: Self-care/ADL training;Therapeutic exercise;Therapeutic activities;Energy conservation;Cognitive remediation/compensation;DME and/or AE instruction;Patient/family education;Balance  training    OT Goals(Current goals can be found in the care plan section) Acute Rehab OT Goals Patient Stated Goal: go home OT Goal Formulation: With patient Time For Goal Achievement: 07/07/20 Potential to Achieve Goals: Good ADL Goals Pt Will Perform Grooming: with min assist;sitting Pt Will Transfer to Toilet: with mod assist;bedside commode Pt Will Perform Toileting - Clothing Manipulation and hygiene: with mod assist;sit to/from stand  OT Frequency: Min 1X/week   Barriers to D/C: Other (comment)  none at this time          AM-PAC OT "6 Clicks" Daily Activity     Outcome Measure Help from another person eating meals?: A Little Help from another person taking care of personal grooming?: A Lot Help from another person toileting, which includes using toliet, bedpan, or urinal?: Total Help from another person bathing (including washing, rinsing, drying)?: Total Help from another person to put on and taking off regular upper body clothing?: A Lot Help from another person to put on and taking off regular lower body clothing?: Total 6 Click Score: 10   End of Session    Activity Tolerance: Patient limited by fatigue Patient left: in bed;with call bell/phone within reach;with bed alarm set;Other (comment) (in chair position)  OT Visit Diagnosis: Unsteadiness on feet (R26.81);Muscle weakness (generalized) (M62.81)                Time: 2751-7001 OT Time Calculation (min): 28 min Charges:  OT General Charges $OT Visit: 1 Visit OT Evaluation $OT Eval Moderate Complexity: 1 Mod OT Treatments $Therapeutic Activity: 8-22 mins  Darleen Crocker, MS, OTR/L , CBIS ascom [phone]  06/23/20, 4:20 PM

## 2020-06-23 NOTE — Progress Notes (Signed)
Patient is resting in bed. Has had no complaints of pain or distress. Abdomen is distended, tender on palpation. She has had several small BMs today, containing clear mucus. Bed in low position and call bell in reach.

## 2020-06-23 NOTE — Progress Notes (Signed)
Progress Note  Patient Name: Stephanie [name] Date of Encounter: 06/23/2020  [facility] Cardiologist: [facility]  Subjective   Denies significant shortness of breath No significant chest pain Feels weak, reports having difficulty sitting on the side of the bed or standing, did not last very long had to sit back down Discussed recent Covid infection, does not know if she got this from her sister or husband.  She was vaccinated  Inpatient Medications    Scheduled Meds: . acidophilus  1 capsule Oral Daily  . apixaban  2.5 mg Oral BID  . vitamin C  500 mg Oral Daily  . brimonidine  1 drop Both Eyes BID  . Chlorhexidine Gluconate Cloth  6 each Topical Q0600  . ciprofloxacin  500 mg Oral Daily  . diltiazem  120 mg Oral Daily  . insulin aspart  0-20 Units Subcutaneous TID AC & HS  . mouth rinse  15 mL Mouth Rinse BID  . metoprolol tartrate  100 mg Oral BID  . pantoprazole  40 mg Oral Daily  . Ensure Max Protein  11 oz Oral TID  . rosuvastatin  10 mg Oral Daily  . sodium chloride flush  10-40 mL Intracatheter Q12H  . sodium chloride flush  3 mL Intravenous Q12H  . tamsulosin  0.4 mg Oral Daily  . vitamin B-12  1,000 mcg Oral Daily  . zinc sulfate  220 mg Oral Daily   Continuous Infusions: . sodium chloride Stopped (06/22/20 1639)   PRN Meds: sodium chloride, acetaminophen, guaiFENesin-dextromethorphan, ondansetron (ZOFRAN) IV, sodium chloride flush, sodium chloride flush   Vital Signs    Vitals:   06/23/20 0730 06/23/20 0817 06/23/20 1117 06/23/20 1142  BP:  111/62  118/75  Pulse:  76  69  Resp:  18  16  Temp:  98.1 F (36.7 C)  98.7 F (37.1 C)  TempSrc:      SpO2: 90% 99% 96% 96%  Weight:      Height:        Intake/Output Summary (Last 24 hours) at 06/23/2020 1540 Last data filed at 06/23/2020 0905 Gross per 24 hour  Intake 323.68 ml  Output 1125 ml  Net -801.32 ml   Last 3 Weights 06/22/2020 06/21/2020 06/19/2020  Weight (lbs) 191 lb 6.4 oz 181 lb 3.2 oz  177 lb 4 oz  Weight (kg) 86.818 kg 82.192 kg 80.4 kg      Telemetry    NSR - Personally Reviewed  ECG     - Personally Reviewed  Physical Exam   GEN: No acute distress.   Neck: No JVD Cardiac: RRR, no murmurs, rubs, or gallops.  Respiratory: Clear to auscultation bilaterally. GI: Soft, nontender, non-distended  MS: No edema; No deformity. Neuro:  Nonfocal  Psych: Normal affect   Labs    High Sensitivity Troponin:   Recent Labs  Lab 06/17/20 0357 06/17/20 1101  TROPONINIHS 61* 71*      Chemistry Recent Labs  Lab 06/17/20 0357 06/17/20 1938 06/18/20 0809 06/18/20 0809 06/19/20 0537 06/20/20 0841 06/21/20 0552 06/22/20 0543 06/23/20 0534 06/23/20 0649  NA 133*   < > 135   < > 134*   < > 128* 128* 126*  --   K 3.9   < > 3.3*   < > 3.6   < > 3.2* 3.5 4.6  --   CL 102   < > 106   < > 101   < > 99 100 100  --   CO2  18*   < > 20*   < > 21*   < > 16* 14* 12*  --   GLUCOSE 344*   < > 97   < > 140*   < > 158* 131* 132*  --   BUN 38*   < > 28*   < > 30*   < > 43* 54* 65*  --   CREATININE 1.57*   < > 1.75*   < > 2.28*   < > 3.79* 4.06* 4.77*  --   CALCIUM 8.9   < > 8.1*   < > 7.8*   < > 6.8* 6.5* 6.4*  --   PROT 6.2*  --  5.3*  --  5.9*  --   --   --   --   --   ALBUMIN 3.0*  --  2.2*  --  2.3*  --   --   --   --  1.7*  AST 27  --  26  --  30  --   --   --   --   --   ALT 21  --  15  --  15  --   --   --   --   --   ALKPHOS 83  --  84  --  110  --   --   --   --   --   BILITOT 1.0  --  0.6  --  0.5  --   --   --   --   --   GFRNONAA 31*   < > 28*   < > 20*   < > 11* 10* 8*  --   GFRAA 36*   < > 32*   < > 23*   < > 13* 12* 10*  --   ANIONGAP 13   < > 9   < > 12   < > 13 14 14   --    < > = values in this interval not displayed.     Hematology Recent Labs  Lab 06/21/20 0552 06/22/20 0543 06/23/20 0534  WBC 12.9* 14.8* 13.3*  RBC 3.65* 3.69* 3.66*  HGB 10.2* 10.1* 9.9*  HCT 29.3* 29.7* 28.4*  MCV 80.3 80.5 77.6*  MCH 27.9 27.4 27.0  MCHC 34.8 34.0 34.9    RDW 15.5 15.9* 16.6*  PLT 93* 105* 153    BNP Recent Labs  Lab 06/18/20 0809  BNP 410.1*     DDimer No results for input(s): DDIMER in the last 168 hours.   Radiology    DG Abd 1 View  Result Date: 06/23/2020 CLINICAL DATA:  Pyelonephritis, stent placement EXAM: ABDOMEN - 1 VIEW COMPARISON:  06/17/2020 FINDINGS: Three supine frontal views of the abdomen and pelvis are obtained. Right ureteral stent identified proximal aspect overlying right renal silhouette and distal aspect overlying the lower pelvis. There is diffuse gaseous distension of the bowel, which may reflect ileus. Maximal diameter of small bowel measures 5.4 cm. There are no masses or abnormal calcifications. Extensive postsurgical changes from thoracolumbar fusion. IMPRESSION: 1. Right ureteral stent as above. 2. Gaseous distention of bowel consistent with small-bowel obstruction or ileus. Electronically Signed   By: Randa Ngo M.D.   On: 06/23/2020 15:21   US RENAL  Result Date: 06/22/2020 CLINICAL DATA:  Inpatient.  Acute kidney injury.  Urinary debris. EXAM: RENAL / URINARY TRACT ULTRASOUND COMPLETE COMPARISON:  06/20/2020 renal sonogram. FINDINGS: Right Kidney: Renal measurements: 12.7 x 6.9 x 7.8  cm = volume: 361 mL. Echogenic right renal parenchyma, normal thickness. Proximal portion of right nephroureteral stent seen in the upper right renal collecting system. No right hydronephrosis. No right renal mass. Left Kidney: Renal measurements: 11.2 x 5.7 x 7.8 cm = volume: 177 mL. Echogenic left renal parenchyma, normal thickness. No left hydronephrosis. No left renal mass. Bladder: Bladder partially collapsed by indwelling Foley catheter. Other: Small amount of free fluid in Morison's pouch and in the left perinephric space. IMPRESSION: 1. No hydronephrosis. 2. Echogenic kidneys, compatible with nonspecific acute renal parenchymal disease. 3. Small amount of free fluid in Morison's pouch and in the left perinephric space.  Electronically Signed   By: Ilona Sorrel M.D.   On: 06/22/2020 10:55    Cardiac Studies     Patient Profile     79 y.o. female with history of hypertension, diabetes, hyperlipidemia, rheumatoid arthritis, chronic urinary retention requiring self-catheterization presenting with fevers chills, diagnosed with sepsis likely from urinary origin, underwent right renal stent placement due to obstruction.  Patient being seen for new onset atrial fibrillation with rapid ventricular response.  Assessment & Plan    Obstructive uropathy with pyelonephritis, Klebsiella Right ureteral stent placement September 18 Hematuria noted, she is on broad-spectrum antibiotics -Encouraged p.o. fluid hydration  Acute renal failure/ATN In the setting of sepsis, obstructive uropathy, IV contrast nephropathy, Baseline creatinine 0.10 October 2019 Lab work today creatinine 4.77 BUN 65 with sodium 126 Nephrology following  Persistent atrial fibrillation On Cardizem,  metoprolol tartrate, rate relatively well controlled, blood pressure stable -On Eliquis, renal dose  Anemia Drop in counts over the past week, hematuria noted On Eliquis, Will monitor closely Hemoglobin 11.8 down to 9.9  Sepsis Klebsiella urinary tract infection with urinary obstruction On broad-spectrum antibiotics  COVID-19 pneumonia Patient'sCOVID-19 PCR test was positive on 06/09/20.   Remdesivir started on admission  Hematuria We will need to monitor closely, may need bladder irrigation clots get worse Per nursing notes, team is aware   Total encounter time more than 35 minutes  Greater than 50% was spent in counseling and coordination of care with the patient    For questions or updates, please contact [place] Please consult [email or web address] for contact info under        Signed, Ida Rogue, MD  06/23/2020, 3:40 PM

## 2020-06-23 NOTE — Progress Notes (Signed)
Spoke with nephrology and asked why patient urine color is dark red with clots. He stated that it could be from the ureter stent that was placed on 06/18/20. He stated that as long as urine is passing through catheter it is ok. Patient resting in bed, low position with call bell in reach.

## 2020-06-23 NOTE — Progress Notes (Signed)
PROGRESS NOTE    Stephanie [name]  RWE:[id number] DOB: [birth year] DOA: 06/17/2020 PCP: Rusty Aus, MD    Assessment & Plan:   Principal Problem:   Sepsis [facility]) Active Problems:   Benign essential hypertension   Chronic cystitis   Rheumatoid arthritis involving multiple sites with positive rheumatoid factor ([place])   Diabetes mellitus ([place])   Rheumatoid arthritis ([place])   COVID-19 virus infection   AKI (acute kidney injury) ([place])   Atrial fibrillation with rapid ventricular response ([place])   Essential hypertension   Pure hypercholesterolemia   Acute pyelonephritis   Hydronephrosis, right   Lactic acid acidosis   Hyperglycemia   Hx of essential hypertension   Immunosuppression due to drug therapy   Pressure injury of skin   Acquired thrombophilia ([place])   On continuous oral anticoagulation    Stephanie [name] is a 79 y.o. female with medical history significant for COVID-19 viral infection (positive PCR test June 09, 2020), history of diabetes mellitus, history of rheumatoid arthritis on chronic immunosuppressive therapy, hypertension and a remote history of colon cancer who was brought into the ER by EMS for evaluation of fever.  Patient had a T-max of 104F at home.  She complains of generalized weakness and worsening shortness of breath but denies having any nausea, no vomiting, no chest pain or abdominal pain.  Patient does self caths at home. Patient is fully vaccinated She was started on prednisone by her primary care provider for presumed COVID-19 bronchitis   # Sepsis 2/2 Klebsiella bacteremia and  # right pyelonephritis  --As evidenced by fever with a T-max of 103F, tachycardia, tachypnea, leukocytosis of 20,000 with a left shift and lactic acid of 3.1.  UA positive for infection.  Pt started on ceftriaxone, then broadened to zosyn on 9/19 --CT renal showed "Debris present dependently within the right renal collecting system and bladder"   --Pt received 2L LR f/b  MIVF --Urine grew pan-sensitive Klebsiella. --blood cx 2/2 pos for Klebsiella, repeat blood cx obtained on 9/21, neg growth so far. PLAN:  --de-escalate to oral cipro today  # Severe right hydronephrosis --CT renal showed "Progressive right UPJ obstruction compared to prior October of 2020." --right stent placement by urology 9/18 (found frank pus in the kidney) --US renal 9/20 showed resolved hydro PLAN: --continue Foley  --d/c MIVF, per nephrology --strict I/O  # New onset Afib w RVR, persistent # Acquired thrombophilia  --developed while in the ED.  HR 120's-140's despite multiple doses of IV dilt, IV metop, dilt gtt@15   --started heparin gtt 9/18, transitioned to Eliquis  --It appeared that her right hand IV was infiltrated and all the IV dilt, IV metop and IV amiodarone may have not gotten into her system. --cardiology consulted --rate controlled on current regimen PLAN: --continue lopressor 200 mg BID and Cardizem 120 mg daily --continue Eliquis at reduced dose  # Hematuria --noted on 9/23.  Pt on Eliquis --continue Eliquis at reduced dose  # COVID-19 infection --Pt is fully vaccinated.  Patient's COVID-19 PCR test was positive on 06/09/20.  No overt respiratory symptoms.  CT chest clear.  No real O2 requirement on presentation. --Remdesivir started on admission PLAN: --continue Remdesivir --monitor O2 sats --according to infection control, due to pt's immunosuppressed status PTA, pt needs the 21 day isolation.  AKI, worsening --on admission creatinine is 1.5.  Baseline Cr 0.8.  Acute renal failure secondary to ATN with sepsis, obstructive uropathy, IV contrast nephropathy and prolonged prerenal azotemia.  --US renal showed  resolved hydro and medical renal disease --nephrology consulted PLAN: --continue MIVF but change to LR@75  with KCl  Lactic acidosis 2/2 sepsis --resolved with IVF.  Hx Diabetes mellitus  hyperglycemia related to systemic steroid  administration Patient was started on prednisone as an outpatient by her primary care provider for presumed COVID-19 bronchitis Chest x-ray is negative for pneumonia and patient is not hypoxic Prednisone d/c'ed. 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 --d/c HCTZ  Hypocalcemia Calcium 6.4 today,corrected Calcium calculated at 8.2 --d/c IVF --obtain ionized Ca  Anemia with renal failure:  Hgb stable in 10's --Monitor   Hypoalbuminemia  --albumin 1.7 --Ensure max protein TID    DVT prophylaxis: FV:CBSWHQP Code Status: DNR  Family Communication: husband updated on the phone today 06/23/20  Status is: inpatient Dispo:   The patient is from: home Anticipated d/c is to: home Anticipated d/c date is: >3 days Patient currently is not medically stable to d/c due to: worsening AKI    Subjective and Interval History:  PT worked with pt today and found pt to be very weak.  Pt not ready to eat regular diet.  Urine starting to turn red.  AKI continue to worsen.   Objective: Vitals:   06/23/20 0730 06/23/20 0817 06/23/20 1117 06/23/20 1142  BP:  111/62  118/75  Pulse:  76  69  Resp:  18  16  Temp:  98.1 F (36.7 C)  98.7 F (37.1 C)  TempSrc:      SpO2: 90% 99% 96% 96%  Weight:      Height:        Intake/Output Summary (Last 24 hours) at 06/23/2020 1550 Last data filed at 06/23/2020 0905 Gross per 24 hour  Intake 323.68 ml  Output 1125 ml  Net -801.32 ml   Filed Weights   06/19/20 0625 06/21/20 0125 06/22/20 0453  Weight: 80.4 kg 82.2 kg 86.8 kg    Examination:   Constitutional: NAD, AAOx3 HEENT: conjunctivae  and lids normal, EOMI CV: RRR no M,R,G. Distal pulses +2.  No cyanosis.   RESP: normal respiratory effort, on RA GI: +BS, NTND Extremities: No effusions, edema in BLE SKIN: warm, dry and intact Neuro: II - XII grossly intact.  Sensation intact Psych: Normal mood and affect.    Foley present, draining red urine with some debris sediments.    Data Reviewed: I have personally reviewed following labs and imaging studies  CBC: Recent Labs  Lab 06/17/20 0357 06/17/20 0357 06/18/20 0809 06/18/20 0809 06/19/20 0537 06/20/20 0841 06/21/20 0552 06/22/20 0543 06/23/20 0534  WBC 20.4*   < > 13.7*   < > 13.5* 12.4* 12.9* 14.8* 13.3*  NEUTROABS 17.8*  --  10.7*  --  11.8*  --   --   --   --   HGB 11.8*   < > 11.1*   < > 10.5* 10.4* 10.2* 10.1* 9.9*  HCT 34.2*   < > 31.5*   < > 31.2* 29.6* 29.3* 29.7* 28.4*  MCV 82.4   < > 80.6   < > 83.6 80.2 80.3 80.5 77.6*  PLT 144*   < > 97*   < > 96*  94* 93* 105* 153   < > = values in this interval not displayed.   Basic Metabolic Panel: Recent Labs  Lab 06/18/20 0809 06/18/20 0809 06/19/20 0537 06/20/20 0841 06/21/20 0552 06/22/20 0543 06/23/20 0534  NA 135   < > 134* 131* 128* 128* 126*  K 3.3*   < > 3.6 2.6* 3.2* 3.5 4.6  CL 106   < > 101 99 99 100 100  CO2 20*   < > 21* 19* 16* 14* 12*  GLUCOSE 97   < > 140* 155* 158* 131* 132*  BUN 28*   < > 30* 33* 43* 54* 65*  CREATININE 1.75*   < > 2.28* 2.93* 3.79* 4.06* 4.77*  CALCIUM 8.1*   < > 7.8* 7.0* 6.8* 6.5* 6.4*  MG 1.8  --   --  1.7 1.7 1.7 1.7  PHOS 2.9  --   --   --   --   --   --    < > = values in this interval not displayed.   GFR: Estimated Creatinine Clearance: 11 mL/min (A) (by C-G formula based on SCr of 4.77 mg/dL (H)). Liver Function Tests: Recent Labs  Lab 06/17/20 0357 06/18/20 0809 06/19/20 0537 06/23/20 0649  AST 27 26 30   --   ALT 21 15 15   --   ALKPHOS 83 84 110  --   BILITOT 1.0 0.6 0.5  --   PROT 6.2* 5.3* 5.9*  --   ALBUMIN 3.0* 2.2* 2.3* 1.7*   No  results for input(s): LIPASE, AMYLASE in the last 168 hours. No results for input(s): AMMONIA in the last 168 hours. Coagulation Profile: Recent Labs  Lab 06/18/20 2048  INR 1.2   Cardiac Enzymes: No results for input(s): CKTOTAL, CKMB, CKMBINDEX, TROPONINI in the last 168 hours. BNP (last 3 results) No results for input(s): PROBNP in the last 8760 hours. HbA1C: No results for input(s): HGBA1C in the last 72 hours. CBG: Recent Labs  Lab 06/22/20 1149 06/22/20 1644 06/22/20 2124 06/23/20 0816 06/23/20 1144  GLUCAP 147* 143* 123* 135* 161*   Lipid Profile: No results for input(s): CHOL, HDL, LDLCALC, TRIG, CHOLHDL, LDLDIRECT in the last 72 hours. Thyroid Function Tests: No results for input(s): TSH, T4TOTAL, FREET4, T3FREE, THYROIDAB in the last 72 hours. Anemia Panel: No results for input(s): VITAMINB12, FOLATE, FERRITIN, TIBC, IRON, RETICCTPCT in the last 72 hours. Sepsis Labs: Recent Labs  Lab 06/17/20 0357 06/17/20 1101 06/17/20 1935 06/17/20 2217  PROCALCITON 17.78  --   --   --   LATICACIDVEN 3.1* 2.5* 2.8* 1.6    Recent Results (from the past 240 hour(s))  Blood culture (routine x 2)     Status: Abnormal   Collection Time: 06/17/20  3:57 AM   Specimen: BLOOD  Result Value Ref Range Status   Specimen Description   Final    BLOOD LEFT HAND Performed at [facility], [address], [place], [place] [address]    Special Requests   Final    BOTTLES DRAWN AEROBIC AND ANAEROBIC Blood Culture adequate volume Performed at [facility], [address] [place], [place] [address]    Culture  Setup Time   Final    Organism ID to follow GRAM NEGATIVE RODS IN BOTH AEROBIC AND ANAEROBIC BOTTLES CRITICAL RESULT CALLED TO, READ BACK BY AND VERIFIED WITH: AMY THOMPSON AT 9024 ON 06/17/20 SNG Performed at [facility], [address], [place], [place] [address]    Culture KLEBSIELLA  PNEUMONIAE (A)  Final   Report Status 06/19/2020 FINAL   Final   Organism ID, Bacteria KLEBSIELLA PNEUMONIAE  Final      Susceptibility   Klebsiella pneumoniae - MIC*    AMPICILLIN RESISTANT Resistant     CEFAZOLIN <=4 SENSITIVE Sensitive     CEFEPIME <=0.12 SENSITIVE Sensitive     CEFTAZIDIME <=1 SENSITIVE Sensitive     CEFTRIAXONE <=0.25 SENSITIVE Sensitive     CIPROFLOXACIN <=0.25 SENSITIVE Sensitive     GENTAMICIN <=1 SENSITIVE Sensitive     IMIPENEM <=0.25 SENSITIVE Sensitive     TRIMETH/SULFA <=20 SENSITIVE Sensitive     AMPICILLIN/SULBACTAM 4 SENSITIVE Sensitive     PIP/TAZO <=4 SENSITIVE Sensitive     * KLEBSIELLA PNEUMONIAE  Blood culture (routine x 2)     Status: Abnormal   Collection Time: 06/17/20  3:57 AM   Specimen: BLOOD  Result Value Ref Range Status   Specimen Description   Final    BLOOD RIGHT [facility] Performed at [facility], [address], [place], [place] [address]    Special Requests   Final    BOTTLES DRAWN AEROBIC AND ANAEROBIC Blood Culture adequate volume Performed at [facility], [address] [place], [place] [address]    Culture  Setup Time   Final    GRAM NEGATIVE RODS IN BOTH AEROBIC AND ANAEROBIC BOTTLES CRITICAL VALUE NOTED.  VALUE IS CONSISTENT WITH PREVIOUSLY REPORTED AND CALLED VALUE. Performed at [facility], [address] [place], [place] [address]    Culture (A)  Final    KLEBSIELLA PNEUMONIAE SUSCEPTIBILITIES PERFORMED ON PREVIOUS CULTURE WITHIN THE LAST 5 DAYS. Performed at [facility], [place] [address], [place], [place] [address]    Report Status 06/19/2020 FINAL  Final  Blood Culture ID Panel (Reflexed)     Status: Abnormal   Collection Time: 06/17/20  3:57 AM  Result Value Ref Range Status   Enterococcus faecalis NOT DETECTED NOT DETECTED Final   Enterococcus Faecium NOT DETECTED NOT DETECTED Final   Listeria monocytogenes NOT DETECTED NOT DETECTED Final   Staphylococcus species NOT DETECTED NOT DETECTED Final   Staphylococcus aureus (BCID) NOT  DETECTED NOT DETECTED Final   Staphylococcus epidermidis NOT DETECTED NOT DETECTED Final   Staphylococcus lugdunensis NOT DETECTED NOT DETECTED Final   Streptococcus species NOT DETECTED NOT DETECTED Final   Streptococcus agalactiae NOT DETECTED NOT DETECTED Final   Streptococcus pneumoniae NOT DETECTED NOT DETECTED Final   Streptococcus pyogenes NOT DETECTED NOT DETECTED Final   A.calcoaceticus-baumannii NOT DETECTED NOT DETECTED Final   Bacteroides fragilis NOT DETECTED NOT DETECTED Final   Enterobacterales DETECTED (A) NOT DETECTED Final    Comment: Enterobacterales represent a large order of gram negative bacteria, not a single organism. CRITICAL RESULT CALLED TO, READ BACK BY AND VERIFIED WITH: AMY THOMPSON AT 9628 ON 06/17/20 SNG    Enterobacter cloacae complex NOT DETECTED NOT DETECTED Final   Escherichia coli NOT DETECTED NOT DETECTED Final   Klebsiella aerogenes NOT DETECTED NOT DETECTED Final   Klebsiella oxytoca NOT DETECTED NOT DETECTED Final   Klebsiella pneumoniae DETECTED (A) NOT DETECTED Final    Comment: CRITICAL RESULT CALLED TO, READ BACK BY AND VERIFIED WITH: AMY THOMPSON RN AT 3662 ON 06/17/20 SNG    Proteus species NOT DETECTED NOT DETECTED Final   Salmonella species NOT DETECTED NOT DETECTED Final   Serratia marcescens NOT DETECTED NOT DETECTED Final   Haemophilus influenzae NOT DETECTED NOT DETECTED Final   Neisseria  meningitidis NOT DETECTED NOT DETECTED Final   Pseudomonas aeruginosa NOT DETECTED NOT DETECTED Final   Stenotrophomonas maltophilia NOT DETECTED NOT DETECTED Final   Candida albicans NOT DETECTED NOT DETECTED Final   Candida auris NOT DETECTED NOT DETECTED Final   Candida glabrata NOT DETECTED NOT DETECTED Final   Candida krusei NOT DETECTED NOT DETECTED Final   Candida parapsilosis NOT DETECTED NOT DETECTED Final   Candida tropicalis NOT DETECTED NOT DETECTED Final   Cryptococcus neoformans/gattii NOT DETECTED NOT DETECTED Final   CTX-M ESBL  NOT DETECTED NOT DETECTED Final   Carbapenem resistance IMP NOT DETECTED NOT DETECTED Final   Carbapenem resistance KPC NOT DETECTED NOT DETECTED Final   Carbapenem resistance NDM NOT DETECTED NOT DETECTED Final   Carbapenem resist OXA 48 LIKE NOT DETECTED NOT DETECTED Final   Carbapenem resistance VIM NOT DETECTED NOT DETECTED Final    Comment: Performed at [facility], [address] [place], [place] [address]  Urine Culture     Status: Abnormal   Collection Time: 06/17/20 11:01 AM   Specimen: Urine, Random  Result Value Ref Range Status   Specimen Description   Final    URINE, RANDOM Performed at [facility], [address] [place], [place] [address]    Special Requests   Final    NONE Performed at [facility], [address] [place], [place] [address]    Culture >=100,000 COLONIES/mL KLEBSIELLA PNEUMONIAE (A)  Final   Report Status 06/19/2020 FINAL  Final   Organism ID, Bacteria KLEBSIELLA PNEUMONIAE (A)  Final      Susceptibility   Klebsiella pneumoniae - MIC*    AMPICILLIN RESISTANT Resistant     CEFAZOLIN <=4 SENSITIVE Sensitive     CEFTRIAXONE <=0.25 SENSITIVE Sensitive     CIPROFLOXACIN <=0.25 SENSITIVE Sensitive     GENTAMICIN <=1 SENSITIVE Sensitive     IMIPENEM <=0.25 SENSITIVE Sensitive     NITROFURANTOIN 32 SENSITIVE Sensitive     TRIMETH/SULFA <=20 SENSITIVE Sensitive     AMPICILLIN/SULBACTAM 4 SENSITIVE Sensitive     PIP/TAZO <=4 SENSITIVE Sensitive     * >=100,000 COLONIES/mL KLEBSIELLA PNEUMONIAE  Culture, blood (Routine X 2) w Reflex to ID Panel     Status: None (Preliminary result)   Collection Time: 06/21/20 11:07 AM   Specimen: BLOOD  Result Value Ref Range Status   Specimen Description BLOOD RIGHT [facility]  Final   Special Requests   Final    BOTTLES DRAWN AEROBIC AND ANAEROBIC Blood Culture adequate volume   Culture   Final    NO GROWTH 2 DAYS Performed at [facility], [address], [place], [place]  [address]    Report Status PENDING  Incomplete  Culture, blood (Routine X 2) w Reflex to ID Panel     Status: None (Preliminary result)   Collection Time: 06/21/20 11:09 AM   Specimen: BLOOD  Result Value Ref Range Status   Specimen Description BLOOD [place]  Final   Special Requests   Final    BOTTLES DRAWN AEROBIC AND ANAEROBIC Blood Culture adequate volume   Culture   Final    NO GROWTH 2 DAYS Performed at [facility], [address], [place],  [address]    Report Status PENDING  Incomplete      Radiology Studies: DG Abd 1 View  Result Date: 06/23/2020 CLINICAL DATA:  Pyelonephritis, stent placement EXAM: ABDOMEN - 1 VIEW COMPARISON:  06/17/2020 FINDINGS: Three supine frontal views of the abdomen and  pelvis are obtained. Right ureteral stent identified proximal aspect overlying right renal silhouette and distal aspect overlying the lower pelvis. There is diffuse gaseous distension of the bowel, which may reflect ileus. Maximal diameter of small bowel measures 5.4 cm. There are no masses or abnormal calcifications. Extensive postsurgical changes from thoracolumbar fusion. IMPRESSION: 1. Right ureteral stent as above. 2. Gaseous distention of bowel consistent with small-bowel obstruction or ileus. Electronically Signed   By: Randa Ngo M.D.   On: 06/23/2020 15:21   US RENAL  Result Date: 06/22/2020 CLINICAL DATA:  Inpatient.  Acute kidney injury.  Urinary debris. EXAM: RENAL / URINARY TRACT ULTRASOUND COMPLETE COMPARISON:  06/20/2020 renal sonogram. FINDINGS: Right Kidney: Renal measurements: 12.7 x 6.9 x 7.8 cm = volume: 361 mL. Echogenic right renal parenchyma, normal thickness. Proximal portion of right nephroureteral stent seen in the upper right renal collecting system. No right hydronephrosis. No right renal mass. Left Kidney: Renal measurements: 11.2 x 5.7 x 7.8 cm = volume: 177 mL. Echogenic left renal parenchyma, normal thickness. No left hydronephrosis. No left  renal mass. Bladder: Bladder partially collapsed by indwelling Foley catheter. Other: Small amount of free fluid in Morison's pouch and in the left perinephric space. IMPRESSION: 1. No hydronephrosis. 2. Echogenic kidneys, compatible with nonspecific acute renal parenchymal disease. 3. Small amount of free fluid in Morison's pouch and in the left perinephric space. Electronically Signed   By: Ilona Sorrel M.D.   On: 06/22/2020 10:55     Scheduled Meds: . acidophilus  1 capsule Oral Daily  . apixaban  2.5 mg Oral BID  . vitamin C  500 mg Oral Daily  . brimonidine  1 drop Both Eyes BID  . Chlorhexidine Gluconate Cloth  6 each Topical Q0600  . ciprofloxacin  500 mg Oral Daily  . diltiazem  120 mg Oral Daily  . insulin aspart  0-20 Units Subcutaneous TID AC & HS  . mouth rinse  15 mL Mouth Rinse BID  . metoprolol tartrate  100 mg Oral BID  . pantoprazole  40 mg Oral Daily  . Ensure Max Protein  11 oz Oral TID  . rosuvastatin  10 mg Oral Daily  . sodium chloride flush  10-40 mL Intracatheter Q12H  . sodium chloride flush  3 mL Intravenous Q12H  . tamsulosin  0.4 mg Oral Daily  . vitamin B-12  1,000 mcg Oral Daily  . zinc sulfate  220 mg Oral Daily   Continuous Infusions: . sodium chloride Stopped (06/22/20 1639)     LOS: 6 days     Enzo Bi, MD [facility] If 7PM-7AM, please contact night-coverage 06/23/2020, 3:50 PM

## 2020-06-23 NOTE — Evaluation (Signed)
Physical Therapy Evaluation Patient Details Name: Stephanie [name] MRN: [id number] DOB: 1941/01/06 Today's Date: 06/23/2020   History of Present Illness  Stephanie [name] is a 77yoF c PMH: COVID infection, DM, RA, HTN, remote colon CA, who comes to [facility] on 9/17.  Clinical Impression  Pt admitted with above diagnosis. Pt currently with functional limitations due to the deficits listed below (see "PT Problem List"). Upon entry, pt in bed, awake and agreeable to participate. The pt is alert and oriented x4, pleasant, conversational, and generally a good historian. ModA to comes to EOB, MaxA+2 for return to supine. Pt dizzy at EOB, assisted at EOB for 5 minutes but never able to sit independently without heavy support of trunk. Pt too weak to attempt standing at this time. Functional mobility assessment demonstrates increased effort/time requirements, poor tolerance, and need for heavy physical assistance, whereas the patient performed these at a higher level of independence PTA. Pt will benefit from skilled PT intervention to increase independence and safety with basic mobility in preparation for discharge to the venue listed below.       Follow Up Recommendations SNF;Supervision for mobility/OOB;Supervision - Intermittent    Equipment Recommendations  None recommended by PT    Recommendations for Other Services       Precautions / Restrictions Precautions Precautions: Fall      Mobility  Bed Mobility Overal bed mobility: Needs Assistance Bed Mobility: Sit to Supine;Supine to Sit     Supine to sit: +2 for physical assistance;Max assist Sit to supine: Mod assist   General bed mobility comments: pt dizzy, also severe truncal weakness, unable to establish independent sitting at EOB without mod-max trunk support. (over 5 minutes)  Transfers                 General transfer comment: unsafe to attempt  Ambulation/Gait                Stairs            Wheelchair Mobility     Modified Rankin (Stroke Patients Only)       Balance Overall balance assessment: Needs assistance Sitting-balance support: Bilateral upper extremity supported;Feet supported Sitting balance-Leahy Scale: Zero                                       Pertinent Vitals/Pain Pain Assessment: No/denies pain    Home Living Family/patient expects to be discharged to:: Private residence Living Arrangements: Spouse/significant other Available Help at Discharge: Family Type of Home: House Home Access: Stairs to enter   [employer] of Steps: 1 partial step Home Layout: One level Home Equipment: Environmental consultant - 4 wheels;Walker - 2 wheels;Wheelchair - manual      Prior Function Level of Independence: Independent with assistive device(s)         Comments: 4WW in house, primarily a Menard ambulator, no falls history in 6 months. Independent c ADL     Hand Dominance        Extremity/Trunk Assessment   Upper Extremity Assessment Upper Extremity Assessment: Generalized weakness    Lower Extremity Assessment Lower Extremity Assessment: Generalized weakness       Communication      Cognition Arousal/Alertness: Awake/alert Behavior During Therapy: WFL for tasks assessed/performed Overall Cognitive Status: Within Functional Limits for tasks assessed  General Comments      Exercises     Assessment/Plan    PT Assessment Patient needs continued PT services  PT Problem List Decreased strength;Decreased activity tolerance;Decreased balance;Decreased mobility       PT Treatment Interventions DME instruction;Balance training;Gait training;Stair training;Functional mobility training;Therapeutic activities;Therapeutic exercise;Patient/family education    PT Goals (Current goals can be found in the Care Plan section)  Acute Rehab PT Goals Patient Stated Goal: regain strength PT Goal Formulation: With  patient Time For Goal Achievement: 07/07/20 Potential to Achieve Goals: Fair    Frequency Min 2X/week   Barriers to discharge        Co-evaluation               AM-PAC PT "6 Clicks" Mobility  Outcome Measure Help needed turning from your back to your side while in a flat bed without using bedrails?: A Lot Help needed moving from lying on your back to sitting on the side of a flat bed without using bedrails?: A Lot Help needed moving to and from a bed to a chair (including a wheelchair)?: Total Help needed standing up from a chair using your arms (e.g., wheelchair or bedside chair)?: Total Help needed to walk in hospital room?: Total Help needed climbing 3-5 steps with a railing? : Total 6 Click Score: 8    End of Session Equipment Utilized During Treatment: Oxygen Activity Tolerance: Patient limited by fatigue;No increased pain Patient left: in bed;with call bell/phone within reach;with bed alarm set Nurse Communication: Mobility status (no supplemental O2 needed at this time) PT Visit Diagnosis: Other abnormalities of gait and mobility (R26.89);Muscle weakness (generalized) (M62.81);Dizziness and giddiness (R42);Difficulty in walking, not elsewhere classified (R26.2)    Time: 1022-1050 PT Time Calculation (min) (ACUTE ONLY): 28 min   Charges:   PT Evaluation $PT Eval Moderate Complexity: 1 Mod PT Treatments $Therapeutic Exercise: 8-22 mins        11:33 AM, 06/23/20 Etta Grandchild, PT, DPT Physical Therapist - [facility]  [phone] ([place])   Kelan Pritt C 06/23/2020, 11:30 AM

## 2020-06-23 NOTE — Progress Notes (Signed)
Rounded on patient this morning. Assessed her urine in catheter draining dark red with blood clots, was reported that in shift report. Patient does not complain of pain or distress. Sacrum has foam border dressing intact/clean. Lung sounds are coarse bilaterally. No skin breaks. Scattered bruises. Bed in low position and call bell in reach.

## 2020-06-23 NOTE — Progress Notes (Addendum)
[facility]  ROUNDING NOTE   Subjective:   Stephanie [name] admitted to [facility] for COVID pneumonia. Patient found to have klebsiella sepsis and had right sided pyelonephritis. She underwent right ureteral stent placed on on 9/18.  Patient had IV contrast exposure on 9/17.  Nephrology consulted for worsening kidney function  Patient resting in bed today, in no acute distress. Denies SOB.   Objective:  Vital signs in last 24 hours:  Temp:  [97.6 F (36.4 C)-98.7 F (37.1 C)] 98.7 F (37.1 C) (09/23 1142) Pulse Rate:  [69-98] 69 (09/23 1142) Resp:  [16-20] 16 (09/23 1142) BP: (105-118)/(62-75) 118/75 (09/23 1142) SpO2:  [90 %-99 %] 96 % (09/23 1142)  Weight change:  Filed Weights   06/19/20 0625 06/21/20 0125 06/22/20 0453  Weight: 80.4 kg 82.2 kg 86.8 kg    Intake/Output: I/O last 3 completed shifts: In: 3814.3 [P.O.:480; I.V.:3167.2; IV Piggyback:167.1] Out: 1275 [Urine:1275]   Intake/Output this shift:  Total I/O In: 3 [I.V.:3] Out: -   Physical Exam: General: Alert, awake, in no acute distress  Head: Normocephalic, atraumatic. Moist oral mucosal membranes  Eyes: Anicteric, PERRL  Neck: Supple, trachea midline  Lungs:  Clear to auscultation bilaterally  Heart: S1S2 +  Irregular  Abdomen:  Soft, nontender,   Extremities:  No peripheral edema.  Neurologic: Nonfocal, moving all four extremities  Skin: No acute rashes or lesions       Basic Metabolic Panel: Recent Labs  Lab 06/18/20 0809 06/18/20 0809 06/19/20 0537 06/19/20 0537 06/20/20 0841 06/20/20 0841 06/21/20 0552 06/22/20 0543 06/23/20 0534  NA 135   < > 134*  --  131*  --  128* 128* 126*  K 3.3*   < > 3.6  --  2.6*  --  3.2* 3.5 4.6  CL 106   < > 101  --  99  --  99 100 100  CO2 20*   < > 21*  --  19*  --  16* 14* 12*  GLUCOSE 97   < > 140*  --  155*  --  158* 131* 132*  BUN 28*   < > 30*  --  33*  --  43* 54* 65*  CREATININE 1.75*   < > 2.28*  --  2.93*  --  3.79* 4.06* 4.77*   CALCIUM 8.1*   < > 7.8*   < > 7.0*   < > 6.8* 6.5* 6.4*  MG 1.8  --   --   --  1.7  --  1.7 1.7 1.7  PHOS 2.9  --   --   --   --   --   --   --   --    < > = values in this interval not displayed.    Liver Function Tests: Recent Labs  Lab 06/17/20 0357 06/18/20 0809 06/19/20 0537 06/23/20 0649  AST 27 26 30   --   ALT 21 15 15   --   ALKPHOS 83 84 110  --   BILITOT 1.0 0.6 0.5  --   PROT 6.2* 5.3* 5.9*  --   ALBUMIN 3.0* 2.2* 2.3* 1.7*   No results for input(s): LIPASE, AMYLASE in the last 168 hours. No results for input(s): AMMONIA in the last 168 hours.  CBC: Recent Labs  Lab 06/17/20 0357 06/17/20 0357 06/18/20 0809 06/18/20 0809 06/19/20 0537 06/20/20 0841 06/21/20 0552 06/22/20 0543 06/23/20 0534  WBC 20.4*   < > 13.7*   < > 13.5*  12.4* 12.9* 14.8* 13.3*  NEUTROABS 17.8*  --  10.7*  --  11.8*  --   --   --   --   HGB 11.8*   < > 11.1*   < > 10.5* 10.4* 10.2* 10.1* 9.9*  HCT 34.2*   < > 31.5*   < > 31.2* 29.6* 29.3* 29.7* 28.4*  MCV 82.4   < > 80.6   < > 83.6 80.2 80.3 80.5 77.6*  PLT 144*   < > 97*   < > 96* 94* 93* 105* 153   < > = values in this interval not displayed.    Cardiac Enzymes: No results for input(s): CKTOTAL, CKMB, CKMBINDEX, TROPONINI in the last 168 hours.  BNP: Invalid input(s): POCBNP  CBG: Recent Labs  Lab 06/22/20 0744 06/22/20 1149 06/22/20 1644 06/22/20 2124 06/23/20 0816  GLUCAP 117* 147* 143* 123* 135*    Microbiology: Results for orders placed or performed during the hospital encounter of 06/17/20  Blood culture (routine x 2)     Status: Abnormal   Collection Time: 06/17/20  3:57 AM   Specimen: BLOOD  Result Value Ref Range Status   Specimen Description   Final    BLOOD LEFT HAND Performed at [facility], [address], [place], [place] [address]    Special Requests   Final    BOTTLES DRAWN AEROBIC AND ANAEROBIC Blood Culture adequate volume Performed at [facility], [address]  [place], [place] [address]    Culture  Setup Time   Final    Organism ID to follow GRAM NEGATIVE RODS IN BOTH AEROBIC AND ANAEROBIC BOTTLES CRITICAL RESULT CALLED TO, READ BACK BY AND VERIFIED WITH: AMY THOMPSON AT 1534 ON 06/17/20 [place] Performed at [facility], [address] [place], [place] [address]    Culture KLEBSIELLA PNEUMONIAE (A)  Final   Report Status 06/19/2020 FINAL  Final   Organism ID, Bacteria KLEBSIELLA PNEUMONIAE  Final      Susceptibility   Klebsiella pneumoniae - MIC*    AMPICILLIN RESISTANT Resistant     CEFAZOLIN <=4 SENSITIVE Sensitive     CEFEPIME <=0.12 SENSITIVE Sensitive     CEFTAZIDIME <=1 SENSITIVE Sensitive     CEFTRIAXONE <=0.25 SENSITIVE Sensitive     CIPROFLOXACIN <=0.25 SENSITIVE Sensitive     GENTAMICIN <=1 SENSITIVE Sensitive     IMIPENEM <=0.25 SENSITIVE Sensitive     TRIMETH/SULFA <=20 SENSITIVE Sensitive     AMPICILLIN/SULBACTAM 4 SENSITIVE Sensitive     PIP/TAZO <=4 SENSITIVE Sensitive     * KLEBSIELLA PNEUMONIAE  Blood culture (routine x 2)     Status: Abnormal   Collection Time: 06/17/20  3:57 AM   Specimen: BLOOD  Result Value Ref Range Status   Specimen Description   Final    BLOOD RIGHT [facility] Performed at [facility], [address] [place], [place] [address]    Special Requests   Final    BOTTLES DRAWN AEROBIC AND ANAEROBIC Blood Culture adequate volume Performed at [facility], [address] [place], [place] [address]    Culture  Setup Time   Final    GRAM NEGATIVE RODS IN BOTH AEROBIC AND ANAEROBIC BOTTLES CRITICAL VALUE NOTED.  VALUE IS CONSISTENT WITH PREVIOUSLY REPORTED AND CALLED VALUE. Performed at [facility], [address] [place], [place] [address]    Culture (A)  Final    KLEBSIELLA PNEUMONIAE SUSCEPTIBILITIES PERFORMED ON PREVIOUS CULTURE WITHIN THE LAST 5 DAYS. Performed at  [facility], [place] [address], [place], [place] [address]    Report Status 06/19/2020 FINAL   Final  Blood Culture ID Panel (Reflexed)     Status: Abnormal   Collection Time: 06/17/20  3:57 AM  Result Value Ref Range Status   Enterococcus faecalis NOT DETECTED NOT DETECTED Final   Enterococcus Faecium NOT DETECTED NOT DETECTED Final   Listeria monocytogenes NOT DETECTED NOT DETECTED Final   Staphylococcus species NOT DETECTED NOT DETECTED Final   Staphylococcus aureus (BCID) NOT DETECTED NOT DETECTED Final   Staphylococcus epidermidis NOT DETECTED NOT DETECTED Final   Staphylococcus lugdunensis NOT DETECTED NOT DETECTED Final   Streptococcus species NOT DETECTED NOT DETECTED Final   Streptococcus agalactiae NOT DETECTED NOT DETECTED Final   Streptococcus pneumoniae NOT DETECTED NOT DETECTED Final   Streptococcus pyogenes NOT DETECTED NOT DETECTED Final   A.calcoaceticus-baumannii NOT DETECTED NOT DETECTED Final   Bacteroides fragilis NOT DETECTED NOT DETECTED Final   Enterobacterales DETECTED (A) NOT DETECTED Final    Comment: Enterobacterales represent a large order of gram negative bacteria, not a single organism. CRITICAL RESULT CALLED TO, READ BACK BY AND VERIFIED WITH: AMY THOMPSON AT 7622 ON 06/17/20 [facility]    Enterobacter cloacae complex NOT DETECTED NOT DETECTED Final   Escherichia coli NOT DETECTED NOT DETECTED Final   Klebsiella aerogenes NOT DETECTED NOT DETECTED Final   Klebsiella oxytoca NOT DETECTED NOT DETECTED Final   Klebsiella pneumoniae DETECTED (A) NOT DETECTED Final    Comment: CRITICAL RESULT CALLED TO, READ BACK BY AND VERIFIED WITH: AMY THOMPSON RN AT 6333 ON 06/17/20 [facility]    Proteus species NOT DETECTED NOT DETECTED Final   Salmonella species NOT DETECTED NOT DETECTED Final   Serratia marcescens NOT DETECTED NOT DETECTED Final   Haemophilus influenzae NOT DETECTED NOT DETECTED Final   Neisseria meningitidis NOT DETECTED NOT DETECTED Final   Pseudomonas aeruginosa NOT DETECTED NOT DETECTED Final   Stenotrophomonas maltophilia NOT DETECTED NOT DETECTED  Final   Candida albicans NOT DETECTED NOT DETECTED Final   Candida auris NOT DETECTED NOT DETECTED Final   Candida glabrata NOT DETECTED NOT DETECTED Final   Candida krusei NOT DETECTED NOT DETECTED Final   Candida parapsilosis NOT DETECTED NOT DETECTED Final   Candida tropicalis NOT DETECTED NOT DETECTED Final   Cryptococcus neoformans/gattii NOT DETECTED NOT DETECTED Final   CTX-M ESBL NOT DETECTED NOT DETECTED Final   Carbapenem resistance IMP NOT DETECTED NOT DETECTED Final   Carbapenem resistance KPC NOT DETECTED NOT DETECTED Final   Carbapenem resistance NDM NOT DETECTED NOT DETECTED Final   Carbapenem resist OXA 48 LIKE NOT DETECTED NOT DETECTED Final   Carbapenem resistance VIM NOT DETECTED NOT DETECTED Final    Comment: Performed at [facility], [address] [place], [place] [address]  Urine Culture     Status: Abnormal   Collection Time: 06/17/20 11:01 AM   Specimen: Urine, Random  Result Value Ref Range Status   Specimen Description   Final    URINE, RANDOM Performed at [facility], [address] [place], [place] [address]    Special Requests   Final    NONE Performed at [facility], [address] [place], [place] [address]    Culture >=100,000 COLONIES/mL KLEBSIELLA PNEUMONIAE (A)  Final   Report Status 06/19/2020 FINAL  Final   Organism ID, Bacteria KLEBSIELLA PNEUMONIAE (A)  Final      Susceptibility   Klebsiella pneumoniae - MIC*    AMPICILLIN  RESISTANT Resistant     CEFAZOLIN <=4 SENSITIVE Sensitive     CEFTRIAXONE <=0.25 SENSITIVE Sensitive     CIPROFLOXACIN <=0.25 SENSITIVE Sensitive     GENTAMICIN <=1 SENSITIVE Sensitive     IMIPENEM <=0.25 SENSITIVE Sensitive     NITROFURANTOIN 32 SENSITIVE Sensitive     TRIMETH/SULFA <=20 SENSITIVE Sensitive     AMPICILLIN/SULBACTAM 4 SENSITIVE Sensitive     PIP/TAZO <=4 SENSITIVE Sensitive     * >=100,000 COLONIES/mL KLEBSIELLA PNEUMONIAE  Culture, blood (Routine X 2) w Reflex to  ID Panel     Status: None (Preliminary result)   Collection Time: 06/21/20 11:07 AM   Specimen: BLOOD  Result Value Ref Range Status   Specimen Description BLOOD RIGHT [facility]  Final   Special Requests   Final    BOTTLES DRAWN AEROBIC AND ANAEROBIC Blood Culture adequate volume   Culture   Final    NO GROWTH 2 DAYS Performed at [facility], [address], [place], [place] [address]    Report Status PENDING  Incomplete  Culture, blood (Routine X 2) w Reflex to ID Panel     Status: None (Preliminary result)   Collection Time: 06/21/20 11:09 AM   Specimen: BLOOD  Result Value Ref Range Status   Specimen Description BLOOD [place]  Final   Special Requests   Final    BOTTLES DRAWN AEROBIC AND ANAEROBIC Blood Culture adequate volume   Culture   Final    NO GROWTH 2 DAYS Performed at [facility], [address] [place], [place] [address]    Report Status PENDING  Incomplete    Coagulation Studies: No results for input(s): LABPROT, INR in the last 72 hours.  Urinalysis: No results for input(s): COLORURINE, LABSPEC, PHURINE, GLUCOSEU, HGBUR, BILIRUBINUR, KETONESUR, PROTEINUR, UROBILINOGEN, NITRITE, LEUKOCYTESUR in the last 72 hours.  Invalid input(s): APPERANCEUR    Imaging: US RENAL  Result Date: 06/22/2020 CLINICAL DATA:  Inpatient.  Acute kidney injury.  Urinary debris. EXAM: RENAL / URINARY TRACT ULTRASOUND COMPLETE COMPARISON:  06/20/2020 renal sonogram. FINDINGS: Right Kidney: Renal measurements: 12.7 x 6.9 x 7.8 cm = volume: 361 mL. Echogenic right renal parenchyma, normal thickness. Proximal portion of right nephroureteral stent seen in the upper right renal collecting system. No right hydronephrosis. No right renal mass. Left Kidney: Renal measurements: 11.2 x 5.7 x 7.8 cm = volume: 177 mL. Echogenic left renal parenchyma, normal thickness. No left hydronephrosis. No left renal mass. Bladder: Bladder partially collapsed by indwelling Foley catheter. Other:  Small amount of free fluid in Morison's pouch and in the left perinephric space. IMPRESSION: 1. No hydronephrosis. 2. Echogenic kidneys, compatible with nonspecific acute renal parenchymal disease. 3. Small amount of free fluid in Morison's pouch and in the left perinephric space. Electronically Signed   By: Ilona Sorrel M.D.   On: 06/22/2020 10:55     Medications:   . sodium chloride Stopped (06/22/20 1639)   . acidophilus  1 capsule Oral Daily  . apixaban  5 mg Oral BID  . vitamin C  500 mg Oral Daily  . brimonidine  1 drop Both Eyes BID  . Chlorhexidine Gluconate Cloth  6 each Topical Q0600  . ciprofloxacin  500 mg Oral Daily  . diltiazem  120 mg Oral Daily  . insulin aspart  0-20 Units Subcutaneous TID AC & HS  . mouth rinse  15 mL Mouth Rinse BID  . metoprolol tartrate  100 mg Oral BID  . pantoprazole  40 mg Oral  Daily  . Ensure Max Protein  11 oz Oral BID  . rosuvastatin  10 mg Oral Daily  . sodium chloride flush  10-40 mL Intracatheter Q12H  . sodium chloride flush  3 mL Intravenous Q12H  . tamsulosin  0.4 mg Oral Daily  . vitamin B-12  1,000 mcg Oral Daily  . zinc sulfate  220 mg Oral Daily   sodium chloride, acetaminophen, guaiFENesin-dextromethorphan, ondansetron (ZOFRAN) IV, sodium chloride flush, sodium chloride flush  Assessment/ Plan:  Stephanie [name] is a 79 y.o.  female admitted to [facility] for COVID pneumonia. Patient found to have klebsiella sepsis and had right sided pyelonephritis. She underwent right ureteral stent placed on on 9/18.  Patient had IV contrast exposure on 9/17.  Nephrology consulted for worsening kidney function 1. Acute renal failure:  Baseline creatinine of 0.9 with normal GFR on 10/16/2019.  Acute renal failure secondary to ATN with sepsis, obstructive uropathy, IV contrast nephropathy and prolonged prerenal azotemia.   Creatinine 4.77, trending up today Dialysis not indicated at this time  2. Obstructive uropathy with pyelonephritis with  klebsiella. Status post right ureteral stent placement by Dr. Jeffie Pollock on 9/18. Getting treated with antibiotics Zosyn Urine output adequate, dark red with few clots Urology following up  3. Hyponatremia:  Secondary to renal failure Na+  Down to 126 today,will continue monitoring labs closely  4. Hypocalcemia Calcium 6.4 today,corrected Calcium calculated at 8.2 IVFluids discontinued  5. Anemia with renal failure:  Hgb  9.9  Will continue monitoring CBCs     LOS: 6 Princy Raju 9/23/202111:50 AM  Patient was seen and examined with Crosby Oyster, DNP.  Discontinued IV fluids.  Acute renal failure secondary to contrast induced nephropathy.  Above plan was discussed and agreed upon on the signing of this note.   Lavonia Dana, [facility]  9/23/20212:17 PM

## 2020-06-24 ENCOUNTER — Inpatient Hospital Stay: Payer: Medicare Other

## 2020-06-24 DIAGNOSIS — N12 Tubulo-interstitial nephritis, not specified as acute or chronic: Secondary | ICD-10-CM

## 2020-06-24 DIAGNOSIS — R140 Abdominal distension (gaseous): Secondary | ICD-10-CM

## 2020-06-24 LAB — BLOOD GAS, ARTERIAL
Acid-base deficit: 17 mmol/L — ABNORMAL HIGH (ref 0.0–2.0)
Bicarbonate: 8.1 mmol/L — ABNORMAL LOW (ref 20.0–28.0)
O2 Saturation: 98.3 %
Patient temperature: 37
pCO2 arterial: <19 mmHg — CL (ref 32.0–48.0)
pH, Arterial: 7.26 — ABNORMAL LOW (ref 7.350–7.450)
pO2, Arterial: 125 mmHg — ABNORMAL HIGH (ref 83.0–108.0)

## 2020-06-24 LAB — MAGNESIUM: Magnesium: 1.5 mg/dL — ABNORMAL LOW (ref 1.7–2.4)

## 2020-06-24 LAB — CBC
HCT: 27 % — ABNORMAL LOW (ref 36.0–46.0)
Hemoglobin: 9.9 g/dL — ABNORMAL LOW (ref 12.0–15.0)
MCH: 28 pg (ref 26.0–34.0)
MCHC: 36.7 g/dL — ABNORMAL HIGH (ref 30.0–36.0)
MCV: 76.5 fL — ABNORMAL LOW (ref 80.0–100.0)
Platelets: 175 10*3/uL (ref 150–400)
RBC: 3.53 MIL/uL — ABNORMAL LOW (ref 3.87–5.11)
RDW: 16.3 % — ABNORMAL HIGH (ref 11.5–15.5)
WBC: 13.1 10*3/uL — ABNORMAL HIGH (ref 4.0–10.5)
nRBC: 0 % (ref 0.0–0.2)

## 2020-06-24 LAB — HEPARIN LEVEL (UNFRACTIONATED): Heparin Unfractionated: 2.56 IU/mL — ABNORMAL HIGH (ref 0.30–0.70)

## 2020-06-24 LAB — BASIC METABOLIC PANEL
Anion gap: 14 (ref 5–15)
BUN: 87 mg/dL — ABNORMAL HIGH (ref 8–23)
CO2: 12 mmol/L — ABNORMAL LOW (ref 22–32)
Calcium: 6.4 mg/dL — CL (ref 8.9–10.3)
Chloride: 98 mmol/L (ref 98–111)
Creatinine, Ser: 5.39 mg/dL — ABNORMAL HIGH (ref 0.44–1.00)
GFR calc Af Amer: 8 mL/min — ABNORMAL LOW (ref >60)
GFR calc non Af Amer: 7 mL/min — ABNORMAL LOW (ref >60)
Glucose, Bld: 129 mg/dL — ABNORMAL HIGH (ref 70–99)
Potassium: 4.4 mmol/L (ref 3.5–5.1)
Sodium: 124 mmol/L — ABNORMAL LOW (ref 135–145)

## 2020-06-24 LAB — APTT: aPTT: 44 seconds — ABNORMAL HIGH (ref 24–36)

## 2020-06-24 LAB — GLUCOSE, CAPILLARY
Glucose-Capillary: 149 mg/dL — ABNORMAL HIGH (ref 70–99)
Glucose-Capillary: 114 mg/dL — ABNORMAL HIGH (ref 70–99)
Glucose-Capillary: 102 mg/dL — ABNORMAL HIGH (ref 70–99)
Glucose-Capillary: 154 mg/dL — ABNORMAL HIGH (ref 70–99)

## 2020-06-24 LAB — PROTIME-INR
INR: 2 — ABNORMAL HIGH (ref 0.8–1.2)
Prothrombin Time: 21.6 seconds — ABNORMAL HIGH (ref 11.4–15.2)

## 2020-06-24 LAB — CALCIUM, IONIZED: Calcium, Ionized, Serum: 3.5 mg/dL — ABNORMAL LOW (ref 4.5–5.6)

## 2020-06-24 LAB — LACTIC ACID, PLASMA
Lactic Acid, Venous: 0.9 mmol/L (ref 0.5–1.9)
Lactic Acid, Venous: 0.9 mmol/L (ref 0.5–1.9)

## 2020-06-24 MED ORDER — DILTIAZEM HCL-DEXTROSE 125-5 MG/125ML-% IV SOLN (PREMIX)
5.0000 mg/h | INTRAVENOUS | Status: DC
  Filled 2020-06-24: qty 125

## 2020-06-24 MED ORDER — SODIUM BICARBONATE 8.4 % IV SOLN
INTRAVENOUS | Status: DC
  Filled 2020-06-24 (×2): qty 850
  Filled 2020-06-24: qty 150
  Filled 2020-06-24: qty 850
  Filled 2020-06-24: qty 150
  Filled 2020-06-24: qty 850
  Filled 2020-06-24: qty 150
  Filled 2020-06-24: qty 850
  Filled 2020-06-24 (×3): qty 150
  Filled 2020-06-24 (×4): qty 850

## 2020-06-24 MED ORDER — CIPROFLOXACIN IN D5W 400 MG/200ML IV SOLN
400.0000 mg | INTRAVENOUS | Status: DC
  Administered 2020-06-25: 10:00:00 400 mg via INTRAVENOUS
  Filled 2020-06-24 (×2): qty 200

## 2020-06-24 MED ORDER — HEPARIN (PORCINE) 25000 UT/250ML-% IV SOLN
750.0000 [IU]/h | INTRAVENOUS | Status: DC
  Administered 2020-06-25 – 2020-06-29 (×4): 750 [IU]/h via INTRAVENOUS
  Filled 2020-06-24 (×4): qty 250

## 2020-06-24 MED ORDER — MAGNESIUM SULFATE 2 GM/50ML IV SOLN
2.0000 g | Freq: Once | INTRAVENOUS | Status: AC
  Administered 2020-06-24: 09:00:00 2 g via INTRAVENOUS
  Filled 2020-06-24: qty 50

## 2020-06-24 MED ORDER — IOHEXOL 9 MG/ML PO SOLN
500.0000 mL | ORAL | Status: AC
  Administered 2020-06-24 (×2): 500 mL via ORAL

## 2020-06-24 MED ORDER — HEPARIN (PORCINE) 25000 UT/250ML-% IV SOLN: 750.0000 [IU]/h | INTRAVENOUS | Status: DC

## 2020-06-24 MED ORDER — ALBUMIN HUMAN 25 % IV SOLN
12.5000 g | Freq: Four times a day (QID) | INTRAVENOUS | Status: DC
  Administered 2020-06-24 – 2020-06-26 (×8): 12.5 g via INTRAVENOUS
  Filled 2020-06-24 (×10): qty 50

## 2020-06-24 NOTE — Progress Notes (Signed)
Notified Dr. Billie Ruddy verbally that patient had critical CO2 <19 and bicarb 8. She stated that she would talk with nephrology and in her opinion patient kidney function is not doing good. She is resting in bed. She finished all contrast and awaiting CT to pick patient up for scan. Bed in low position and call bell in reach.

## 2020-06-24 NOTE — NC FL2 (Signed)
[place] LEVEL OF CARE SCREENING TOOL     IDENTIFICATION  Patient Name: Stephanie [name] Birthdate: 1940-10-16 Sex: female Admission Date (Current Location): 06/17/2020  [facility] and [place] Number:  Engineering geologist and Address:  [facility], [address] [place], [place] [address]      Provider Number:    Attending Physician Name and Address:  Enzo Bi, MD  Relative Name and Phone Number:  KATTI, PELLE (Spouse) [phone]    Current Level of Care: Hospital Recommended Level of Care: [place] Prior Approval Number:    Date Approved/Denied:   PASRR Number: [id number]  Discharge Plan: SNF    Current Diagnoses: Patient Active Problem List   Diagnosis Date Noted   Pressure injury of skin 06/19/2020   Acquired thrombophilia ([place]) 06/19/2020   On continuous oral anticoagulation 06/19/2020   Acute pyelonephritis 06/18/2020   Hydronephrosis, right 06/18/2020   Lactic acid acidosis 06/18/2020   Hyperglycemia 06/18/2020   Hx of essential hypertension 06/18/2020   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   Atrial fibrillation with rapid ventricular response ([place])    Essential hypertension    Pure hypercholesterolemia    Diabetes mellitus ([place]) 06/17/2020   Rheumatoid arthritis ([place]) 06/17/2020   COVID-19 virus infection 06/17/2020   AKI (acute kidney injury) ([place]) 06/17/2020   Chronic left SI joint pain 04/19/2020   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[place]) 04/19/2020   Chronic radicular lumbar pain 04/19/2020   Lumbar spondylosis 04/19/2020   Chronic pain syndrome 04/19/2020   Sepsis ([place]) 07/05/2019   CAP (community acquired pneumonia) 07/05/2019   Gastroenteritis 07/05/2019   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   Elevated serum creatinine 10/16/2017   Rheumatoid arthritis involving multiple sites with positive rheumatoid factor (HCC)  08/13/2016   Hyperlipidemia, mixed 04/18/2016   Gross hematuria 11/01/2015   History of colon cancer 10/18/2014   Squamous cell metaplasia of urinary bladder 04/29/2014   Osteoarthritis of knee 04/01/2014   B-complex deficiency 03/03/2014   Benign essential hypertension 01/16/2014   Unspecified thoracic, thoracolumbar and lumbosacral intervertebral disc disorder 01/16/2014   Malignant neoplasm of colon ([place]) 01/16/2014   Bladder neoplasm of uncertain malignant potential 09/14/2013   Nocturia 08/05/2013   Scoliosis (and kyphoscoliosis), idiopathic 06/24/2013   Chronic cystitis 05/04/2013   Increased frequency of urination 05/04/2013   Incomplete emptying of bladder 05/04/2013    Orientation RESPIRATION BLADDER Height & Weight     Self, Place      Weight: 191 lb 6.4 oz (86.8 kg) Height:  5\' 6"  (167.6 cm)  BEHAVIORAL SYMPTOMS/MOOD NEUROLOGICAL BOWEL NUTRITION STATUS           AMBULATORY STATUS COMMUNICATION OF NEEDS Skin   Extensive Assist Verbally                         Personal Care Assistance Level of Assistance  Dressing, Feeding, Bathing, Total care Bathing Assistance: Limited assistance Feeding assistance: Limited assistance Dressing Assistance: Maximum assistance Total Care Assistance: Maximum assistance   Functional Limitations Info             SPECIAL CARE FACTORS FREQUENCY  PT (By licensed PT), OT (By licensed OT)     PT Frequency: 5 x weekly OT Frequency: 5 x weekly            Contractures      Additional Factors Info  Code Status Code Status Info: DNR  Current Medications (06/24/2020):  This is the current hospital active medication list Current Facility-Administered Medications  Medication Dose Route Frequency Provider Last Rate Last Admin   0.9 %  sodium chloride infusion  250 mL Intravenous PRN Irine Seal, MD   Stopped at 06/22/20 1639   acetaminophen (TYLENOL) tablet 650 mg  650 mg Oral Q6H PRN Irine Seal, MD   650 mg at 06/24/20 0334   acidophilus (RISAQUAD) capsule 1 capsule  1 capsule Oral Daily Irine Seal, MD   1 capsule at 06/24/20 0907   albuterol (VENTOLIN HFA) 108 (90 Base) MCG/ACT inhaler 2 puff  2 puff Inhalation Q4H PRN Sharion Settler, NP       ascorbic acid (VITAMIN C) tablet 500 mg  500 mg Oral Daily Irine Seal, MD   500 mg at 06/24/20 0907   brimonidine (ALPHAGAN) 0.2 % ophthalmic solution 1 drop  1 drop Both Eyes BID Irine Seal, MD   1 drop at 06/24/20 9323   Chlorhexidine Gluconate Cloth 2 % PADS 6 each  6 each Topical Q0600 Irine Seal, MD   6 each at 06/24/20 0907   [START ON 06/25/2020] ciprofloxacin (CIPRO) IVPB 400 mg  400 mg Intravenous Q24H Enzo Bi, MD       diltiazem (CARDIZEM) 125 mg in dextrose 5% 125 mL (1 mg/mL) infusion  5-15 mg/hr Intravenous Titrated Gollan, Kathlene November, MD       guaiFENesin-dextromethorphan (ROBITUSSIN DM) 100-10 MG/5ML syrup 10 mL  10 mL Oral Q4H PRN Irine Seal, MD   10 mL at 06/23/20 0004   heparin ADULT infusion 100 units/mL (25000 units/243mL sodium chloride 0.45%)  750 Units/hr Intravenous Continuous Shanlever, Pierce Crane, RPH       insulin aspart (novoLOG) injection 0-20 Units  0-20 Units Subcutaneous TID AC & HS Irine Seal, MD   3 Units at 06/24/20 0844   MEDLINE mouth rinse  15 mL Mouth Rinse BID Irine Seal, MD   15 mL at 06/23/20 2053   ondansetron (ZOFRAN) injection 4 mg  4 mg Intravenous Q6H PRN Irine Seal, MD   4 mg at 06/23/20 0454   pantoprazole (PROTONIX) EC tablet 40 mg  40 mg Oral Daily Irine Seal, MD   40 mg at 06/24/20 0906   protein supplement (ENSURE MAX) liquid  11 oz Oral TID Enzo Bi, MD   11 oz at 06/23/20 2052   sodium chloride flush (NS) 0.9 % injection 10-40 mL  10-40 mL Intracatheter Q12H Enzo Bi, MD   10 mL at 06/24/20 0908   sodium chloride flush (NS) 0.9 % injection 3 mL  3 mL Intravenous Q12H Irine Seal, MD   3 mL at 06/24/20 0908   sodium chloride flush (NS) 0.9 % injection 3 mL  3 mL  Intravenous PRN Irine Seal, MD   3 mL at 06/22/20 0422   tamsulosin (FLOMAX) capsule 0.4 mg  0.4 mg Oral Daily Irine Seal, MD   0.4 mg at 06/24/20 5573   vitamin B-12 (CYANOCOBALAMIN) tablet 1,000 mcg  1,000 mcg Oral Daily Irine Seal, MD   1,000 mcg at 06/24/20 2202   zinc sulfate capsule 220 mg  220 mg Oral Daily Irine Seal, MD   220 mg at 06/24/20 5427     Discharge Medications: Please see discharge summary for a list of discharge medications.  Relevant Imaging Results:  Relevant Lab Results:   Additional Information SS# [id number]  Meriel Flavors, LCSW

## 2020-06-24 NOTE — Progress Notes (Signed)
Dr Billie Ruddy and RN Raquel Sarna made aware that ABG report critical co2<19, bicarb 8

## 2020-06-24 NOTE — Progress Notes (Signed)
OT Cancellation Note  Patient Details Name: Stephanie [name] MRN: [id number] DOB: 11-04-1940   Cancelled Treatment:    Reason Eval/Treat Not Completed: Other (comment). Multiple attempts made to see pt with staff present performing assessment or tending to other needs each time. OT will follow up when pt is next available.   Darleen Crocker, MS, OTR/L , CBIS ascom [phone]  06/24/20, 11:09 AM  06/24/2020, 11:08 AM

## 2020-06-24 NOTE — Progress Notes (Signed)
Dr. Billie Ruddy did get in touch with me regarding patient situation. Suggested to her about holding medications due to possible obstruction. She stated to give meds with small sips of water. Cardiologist came into room at the time of med pass. Informed him of changes to patient status with abdomen and right forearm. He stated to elevate right arm onto pillow to keep dependent edema down. Then he stated that patient abdomen seems more distended and would speak to Dr. Billie Ruddy regarding situation. Dr. Billie Ruddy has ordered for patient to undergo CT with contrast. She is currently drinking the second bottle. Patient is resting in bed. Has no complaints of pain or distress. Has been belching but no vomiting or nausea. Call bell in reach.

## 2020-06-24 NOTE — Consult Note (Signed)
ANTICOAGULATION CONSULT NOTE - Initial Consult  Pharmacy Consult for Heparin Drip Indication:  atrial fibrillation (worsening renal function, stopping eliquis, also with ileus)  Allergies  Allergen Reactions  . Ibuprofen Itching  . Indomethacin   . Infliximab   . Methotrexate Derivatives   . Moexipril   . Percocet [Oxycodone-Acetaminophen] Itching  . Naprosyn [Naproxen] Rash    Patient Measurements: Height: 5\' 6"  (167.6 cm) Weight: 86.8 kg (191 lb 6.4 oz) IBW/kg (Calculated) : 59.3 Heparin Dosing Weight: 75.9kg  Vital Signs: Temp: 98.1 F (36.7 C) (09/24 0745) Temp Source: Oral (09/24 0542) BP: 131/67 (09/24 0745) Pulse Rate: 74 (09/24 0745)  Labs: Recent Labs    06/22/20 0543 06/22/20 0543 06/23/20 0534 06/24/20 0610  HGB 10.1*   < > 9.9* 9.9*  HCT 29.7*  --  28.4* 27.0*  PLT 105*  --  153 175  CREATININE 4.06*  --  4.77* 5.39*   < > = values in this interval not displayed.    Estimated Creatinine Clearance: 9.7 mL/min (A) (by C-G formula based on SCr of 5.39 mg/dL (H)).   Medical History: Past Medical History:  Diagnosis Date  . Arthritis   . Asthma   . Cancer ([place]) 1996   colon  . Diabetes mellitus without complication ([place])   . GERD (gastroesophageal reflux disease)   . Glaucoma   . Hyperlipidemia   . Hypertension   . Lumbar disc disease   . Small bowel obstruction ([place])   . Vitamin B 12 deficiency     Medications:  Last dose Eliquis as inpatient 9/24 09/07  Assessment: 79 y.o.femalewith history of hypertension, diabetes, hyperlipidemia, rheumatoid arthritis, chronic urinary retention requiring self-catheterization presenting with fevers chills, diagnosed with sepsis likely from urinary origin, underwent right renal stent placement due to obstruction.   Patient being seen for new onset atrial fibrillation with rapid ventricular response.  Now with possible ileus and worsening Scr, will dc Eliquis and start heparin per pharmacy  consult.  Goal of Therapy:  aPTT 66-102 seconds Monitor platelets by anticoagulation protocol: Yes   Plan:  Will start heparin infusion at 750 units/hr ~12 hours after last dose of apixiban without bolus  Continue to monitor H&H and platelets   Will check APTT 8 hours after initiation of heparin drip  CBC/heparin level daily - will follow aptt until heparin level and aptt correlate  Lu Duffel, PharmD, BCPS Clinical Pharmacist 06/24/2020 12:27 PM

## 2020-06-24 NOTE — Progress Notes (Signed)
Cross Cover Note Heart rate in 60's. Previous transfer order for progressive care for need for cardizem dgtt.  Transfer order held due to bed availability and patient not in need for gtt at this time

## 2020-06-24 NOTE — Progress Notes (Addendum)
Patient has been seen by several doctors today: general surgery, cardiologist, and her attending. They have made orders for patient care. Inserted NG tube, 14 FR to right nare, total of 650cc of brown liquid fluid is in suction canister. Around 0930, cardiologist came to see patient and stated that patient seemed worse today than yesterday. Consulted with Dr. Billie Ruddy and suggested we not give her anything PO and change some of her medication to IV. He changed cardizem to IV drip. Alerted Dr. Billie Ruddy that IC does not maintain drips and she would need to be transferred to a higher level of care. She did put in orders, however patient has not been moved due to bed availability. Patient heart rate is stable in the 60s-70s. Have not been alerted of any activity from CMD. Notified Dr. Billie Ruddy about medication and need to either change order or discontinue. Awaiting response.  32- dr stated she talked with [facility], she stated that patient will be moving to PCU and to keep cardizem order in place

## 2020-06-24 NOTE — Progress Notes (Signed)
Patient on assessment has more abdominal distention than yesterday. Auscultated bowel sounds, no audile sounds present in all four quadrants. Informed Dr. Billie Ruddy of findings. No further orders at this time.

## 2020-06-24 NOTE — Progress Notes (Signed)
[facility]  ROUNDING NOTE   Subjective:   Short of breath, more confused. Not swallowing. Concern for ileus.   Decreased urine output.   Objective:  Vital signs in last 24 hours:  Temp:  [97.5 F (36.4 C)-99 F (37.2 C)] 97.5 F (36.4 C) (09/24 1222) Pulse Rate:  [67-77] 67 (09/24 1222) Resp:  [16-21] 21 (09/24 1222) BP: (110-131)/(63-80) 110/79 (09/24 1222) SpO2:  [95 %-98 %] 97 % (09/24 1222)  Weight change:  Filed Weights   06/19/20 0625 06/21/20 0125 06/22/20 0453  Weight: 80.4 kg 82.2 kg 86.8 kg    Intake/Output: I/O last 3 completed shifts: In: 72 [P.O.:400; I.V.:3] Out: 1140 [Urine:1140]   Intake/Output this shift:  No intake/output data recorded.  Physical Exam: General: Alert, awake, in no acute distress  Head: Normocephalic, atraumatic. Moist oral mucosal membranes  Eyes: Anicteric, PERRL  Neck: Supple, trachea midline  Lungs:  Clear to auscultation bilaterally  Heart: S1S2 +  Irregular  Abdomen:  Soft, nontender,   Extremities:  No peripheral edema.  Neurologic: Nonfocal, moving all four extremities  Skin: No acute rashes or lesions  GU Dark brown urine    Basic Metabolic Panel: Recent Labs  Lab 06/18/20 0809 06/19/20 0537 06/20/20 0841 06/20/20 0841 06/21/20 0552 06/21/20 0552 06/22/20 0543 06/23/20 0534 06/24/20 0610  NA 135   < > 131*  --  128*  --  128* 126* 124*  K 3.3*   < > 2.6*  --  3.2*  --  3.5 4.6 4.4  CL 106   < > 99  --  99  --  100 100 98  CO2 20*   < > 19*  --  16*  --  14* 12* 12*  GLUCOSE 97   < > 155*  --  158*  --  131* 132* 129*  BUN 28*   < > 33*  --  43*  --  54* 65* 87*  CREATININE 1.75*   < > 2.93*  --  3.79*  --  4.06* 4.77* 5.39*  CALCIUM 8.1*   < > 7.0*   < > 6.8*   < > 6.5* 6.4* 6.4*  MG 1.8  --  1.7  --  1.7  --  1.7 1.7 1.5*  PHOS 2.9  --   --   --   --   --   --   --   --    < > = values in this interval not displayed.    Liver Function Tests: Recent Labs  Lab 06/18/20 0809  06/19/20 0537 06/23/20 0649  AST 26 30  --   ALT 15 15  --   ALKPHOS 84 110  --   BILITOT 0.6 0.5  --   PROT 5.3* 5.9*  --   ALBUMIN 2.2* 2.3* 1.7*   No results for input(s): LIPASE, AMYLASE in the last 168 hours. No results for input(s): AMMONIA in the last 168 hours.  CBC: Recent Labs  Lab 06/18/20 0809 06/18/20 0809 06/19/20 0537 06/19/20 0537 06/20/20 0841 06/21/20 0552 06/22/20 0543 06/23/20 0534 06/24/20 0610  WBC 13.7*   < > 13.5*   < > 12.4* 12.9* 14.8* 13.3* 13.1*  NEUTROABS 10.7*  --  11.8*  --   --   --   --   --   --   HGB 11.1*   < > 10.5*   < > 10.4* 10.2* 10.1* 9.9* 9.9*  HCT 31.5*   < > 31.2*   < >  29.6* 29.3* 29.7* 28.4* 27.0*  MCV 80.6   < > 83.6   < > 80.2 80.3 80.5 77.6* 76.5*  PLT 97*   < > 96*   < > 94* 93* 105* 153 175   < > = values in this interval not displayed.    Cardiac Enzymes: No results for input(s): CKTOTAL, CKMB, CKMBINDEX, TROPONINI in the last 168 hours.  BNP: Invalid input(s): POCBNP  CBG: Recent Labs  Lab 06/23/20 1144 06/23/20 1556 06/23/20 2127 06/24/20 0744 06/24/20 1221  GLUCAP 161* 134* 120* 149* 114*    Microbiology: Results for orders placed or performed during the hospital encounter of 06/17/20  Blood culture (routine x 2)     Status: Abnormal   Collection Time: 06/17/20  3:57 AM   Specimen: BLOOD  Result Value Ref Range Status   Specimen Description   Final    BLOOD LEFT HAND Performed at [facility], [address], [place], [place] [address]    Special Requests   Final    BOTTLES DRAWN AEROBIC AND ANAEROBIC Blood Culture adequate volume Performed at [facility], [address] [place], [place] [address]    Culture  Setup Time   Final    Organism ID to follow GRAM NEGATIVE RODS IN BOTH AEROBIC AND ANAEROBIC BOTTLES CRITICAL RESULT CALLED TO, READ BACK BY AND VERIFIED WITH: AMY THOMPSON AT 1534 ON 06/17/20 Effort Performed at [facility], [address] [place],  [place] [address]    Culture KLEBSIELLA PNEUMONIAE (A)  Final   Report Status 06/19/2020 FINAL  Final   Organism ID, Bacteria KLEBSIELLA PNEUMONIAE  Final      Susceptibility   Klebsiella pneumoniae - MIC*    AMPICILLIN RESISTANT Resistant     CEFAZOLIN <=4 SENSITIVE Sensitive     CEFEPIME <=0.12 SENSITIVE Sensitive     CEFTAZIDIME <=1 SENSITIVE Sensitive     CEFTRIAXONE <=0.25 SENSITIVE Sensitive     CIPROFLOXACIN <=0.25 SENSITIVE Sensitive     GENTAMICIN <=1 SENSITIVE Sensitive     IMIPENEM <=0.25 SENSITIVE Sensitive     TRIMETH/SULFA <=20 SENSITIVE Sensitive     AMPICILLIN/SULBACTAM 4 SENSITIVE Sensitive     PIP/TAZO <=4 SENSITIVE Sensitive     * KLEBSIELLA PNEUMONIAE  Blood culture (routine x 2)     Status: Abnormal   Collection Time: 06/17/20  3:57 AM   Specimen: BLOOD  Result Value Ref Range Status   Specimen Description   Final    BLOOD RIGHT [facility] Performed at [facility], [address], [place], [place] [address]    Special Requests   Final    BOTTLES DRAWN AEROBIC AND ANAEROBIC Blood Culture adequate volume Performed at [facility], [address] [place], [place] [address]    Culture  Setup Time   Final    GRAM NEGATIVE RODS IN BOTH AEROBIC AND ANAEROBIC BOTTLES CRITICAL VALUE NOTED.  VALUE IS CONSISTENT WITH PREVIOUSLY REPORTED AND CALLED VALUE. Performed at [facility], [address] [place],  [address]    Culture (A)  Final    KLEBSIELLA PNEUMONIAE SUSCEPTIBILITIES PERFORMED ON PREVIOUS CULTURE WITHIN THE LAST 5 DAYS. Performed at [facility], [place] [address], [place],  [address]    Report Status 06/19/2020 FINAL  Final  Blood Culture ID Panel (Reflexed)     Status: Abnormal   Collection Time: 06/17/20  3:57 AM  Result Value Ref Range Status   Enterococcus faecalis NOT DETECTED NOT DETECTED Final  Enterococcus Faecium NOT DETECTED NOT DETECTED Final   Listeria monocytogenes NOT DETECTED NOT DETECTED Final    Staphylococcus species NOT DETECTED NOT DETECTED Final   Staphylococcus aureus (BCID) NOT DETECTED NOT DETECTED Final   Staphylococcus epidermidis NOT DETECTED NOT DETECTED Final   Staphylococcus lugdunensis NOT DETECTED NOT DETECTED Final   Streptococcus species NOT DETECTED NOT DETECTED Final   Streptococcus agalactiae NOT DETECTED NOT DETECTED Final   Streptococcus pneumoniae NOT DETECTED NOT DETECTED Final   Streptococcus pyogenes NOT DETECTED NOT DETECTED Final   A.calcoaceticus-baumannii NOT DETECTED NOT DETECTED Final   Bacteroides fragilis NOT DETECTED NOT DETECTED Final   Enterobacterales DETECTED (A) NOT DETECTED Final    Comment: Enterobacterales represent a large order of gram negative bacteria, not a single organism. CRITICAL RESULT CALLED TO, READ BACK BY AND VERIFIED WITH: AMY THOMPSON AT 2297 ON 06/17/20 SNG    Enterobacter cloacae complex NOT DETECTED NOT DETECTED Final   Escherichia coli NOT DETECTED NOT DETECTED Final   Klebsiella aerogenes NOT DETECTED NOT DETECTED Final   Klebsiella oxytoca NOT DETECTED NOT DETECTED Final   Klebsiella pneumoniae DETECTED (A) NOT DETECTED Final    Comment: CRITICAL RESULT CALLED TO, READ BACK BY AND VERIFIED WITH: AMY THOMPSON RN AT 9892 ON 06/17/20 SNG    Proteus species NOT DETECTED NOT DETECTED Final   Salmonella species NOT DETECTED NOT DETECTED Final   Serratia marcescens NOT DETECTED NOT DETECTED Final   Haemophilus influenzae NOT DETECTED NOT DETECTED Final   Neisseria meningitidis NOT DETECTED NOT DETECTED Final   Pseudomonas aeruginosa NOT DETECTED NOT DETECTED Final   Stenotrophomonas maltophilia NOT DETECTED NOT DETECTED Final   Candida albicans NOT DETECTED NOT DETECTED Final   Candida auris NOT DETECTED NOT DETECTED Final   Candida glabrata NOT DETECTED NOT DETECTED Final   Candida krusei NOT DETECTED NOT DETECTED Final   Candida parapsilosis NOT DETECTED NOT DETECTED Final   Candida tropicalis NOT DETECTED NOT  DETECTED Final   Cryptococcus neoformans/gattii NOT DETECTED NOT DETECTED Final   CTX-M ESBL NOT DETECTED NOT DETECTED Final   Carbapenem resistance IMP NOT DETECTED NOT DETECTED Final   Carbapenem resistance KPC NOT DETECTED NOT DETECTED Final   Carbapenem resistance NDM NOT DETECTED NOT DETECTED Final   Carbapenem resist OXA 48 LIKE NOT DETECTED NOT DETECTED Final   Carbapenem resistance VIM NOT DETECTED NOT DETECTED Final    Comment: Performed at [facility], [address], [place], [place] [address]  Urine Culture     Status: Abnormal   Collection Time: 06/17/20 11:01 AM   Specimen: Urine, Random  Result Value Ref Range Status   Specimen Description   Final    URINE, RANDOM Performed at [facility], [address] [place], [place] [address]    Special Requests   Final    NONE Performed at [facility], [address] [place], [place] [address]    Culture >=100,000 COLONIES/mL KLEBSIELLA PNEUMONIAE (A)  Final   Report Status 06/19/2020 FINAL  Final   Organism ID, Bacteria KLEBSIELLA PNEUMONIAE (A)  Final      Susceptibility   Klebsiella pneumoniae - MIC*    AMPICILLIN RESISTANT Resistant     CEFAZOLIN <=4 SENSITIVE Sensitive     CEFTRIAXONE <=0.25 SENSITIVE Sensitive     CIPROFLOXACIN <=0.25 SENSITIVE Sensitive     GENTAMICIN <=1 SENSITIVE Sensitive     IMIPENEM <=0.25 SENSITIVE Sensitive     NITROFURANTOIN 32 SENSITIVE Sensitive     TRIMETH/SULFA <=20 SENSITIVE  Sensitive     AMPICILLIN/SULBACTAM 4 SENSITIVE Sensitive     PIP/TAZO <=4 SENSITIVE Sensitive     * >=100,000 COLONIES/mL KLEBSIELLA PNEUMONIAE  Culture, blood (Routine X 2) w Reflex to ID Panel     Status: None (Preliminary result)   Collection Time: 06/21/20 11:07 AM   Specimen: BLOOD  Result Value Ref Range Status   Specimen Description BLOOD RIGHT [facility]  Final   Special Requests   Final    BOTTLES DRAWN AEROBIC AND ANAEROBIC Blood Culture adequate volume   Culture   Final     NO GROWTH 3 DAYS Performed at [facility], [address] [place], [place] [address]    Report Status PENDING  Incomplete  Culture, blood (Routine X 2) w Reflex to ID Panel     Status: None (Preliminary result)   Collection Time: 06/21/20 11:09 AM   Specimen: BLOOD  Result Value Ref Range Status   Specimen Description BLOOD [place]  Final   Special Requests   Final    BOTTLES DRAWN AEROBIC AND ANAEROBIC Blood Culture adequate volume   Culture   Final    NO GROWTH 3 DAYS Performed at [facility], [address], [place], [place] [address]    Report Status PENDING  Incomplete    Coagulation Studies: Recent Labs    06/24/20 1323  LABPROT 21.6*  INR 2.0*    Urinalysis: No results for input(s): COLORURINE, LABSPEC, PHURINE, GLUCOSEU, HGBUR, BILIRUBINUR, KETONESUR, PROTEINUR, UROBILINOGEN, NITRITE, LEUKOCYTESUR in the last 72 hours.  Invalid input(s): APPERANCEUR    Imaging: CT ABDOMEN PELVIS WO CONTRAST  Addendum Date: 06/24/2020   ADDENDUM REPORT: 06/24/2020 14:16 ADDENDUM: Study discussed by telephone with Dr. Dahlia Byes on 06/24/2020 at 14:13. He advises that the patient is not an ideal surgical candidate at this time, and I agree that a trial of conservative treatment for the presumed adhesion related small-bowel obstruction seems reasonable. I reiterated that this does not appear to be a closed loop obstruction, and that no portal venous gas or other complicating features are identified. Electronically Signed   By: Genevie Ann M.D.   On: 06/24/2020 14:16   Result Date: 06/24/2020 CLINICAL DATA:  79 year old female with abnormal urinary tracks on recent noncontrast CT. Abnormal bowel-gas pattern on KUB yesterday suspicious for small bowel obstruction or ileus. EXAM: CT ABDOMEN AND PELVIS WITHOUT CONTRAST TECHNIQUE: Multidetector CT imaging of the abdomen and pelvis was performed following the standard protocol without IV contrast. COMPARISON:  Noncontrast CT Abdomen  and Pelvis 06/17/2020. KUB yesterday. FINDINGS: Lower chest: Small layering left pleural effusion is new from the prior CT, along with trace right pleural fluid. Mild lower lobe atelectasis on the left. No pericardial effusion. Calcified aortic atherosclerosis. Hepatobiliary: Small volume simple appearing perihepatic free fluid is new. The gallbladder is moderately distended with evidence of dependent sludge, but no convincing pericholecystic inflammation. Negative noncontrast liver. Pancreas: Negative. Spleen: Negative. Adrenals/Urinary Tract: Normal adrenal glands. Stable left kidney and ureter. New right double-J ureteral stent in place. Proximal and distal pigtails appear well position. Regressed right hydronephrosis and right perinephric space edema/inflammation. Decompressed urinary bladder now by Foley catheter, although there is residual bladder wall thickening. Stomach/Bowel: Rectosigmoid anastomosis in the pelvis with no adverse features. Superimposed sigmoid descending and asked him oasis also with no adverse features. The large bowel is decompressed throughout. However, oral contrast has reached the ascending colon. Air and contrast distend the stomach, duodenum, and proximal small bowel loops, with the leading edge of small  bowel contrast trailing off in mid jejunum and continued gas and fluid distended small bowel loops to the level of an above an abrupt transition in the central lower abdomen seen on series 2, image 69. Distal to that small bowel is decompressed and not opacified with contrast to the terminal ileum. There is a small volume of interloop fluid in the small bowel mesentery. No free air. Only mild small bowel wall thickening is identified. Vascular/Lymphatic: Extensive Aortoiliac calcified atherosclerosis. Vascular patency is not evaluated in the absence of IV contrast. No lymphadenopathy identified. Reproductive: Diminutive or absent uterus and ovaries as before. Other: Small volume of  free fluid in the pelvis is new with simple fluid density (series 2, image 79). Bilateral flank and lower abdominal wall subcutaneous edema. Musculoskeletal: Extensive previous posterior thoracolumbar spinal fusion which extends to the iliac wings appears stable. No acute osseous abnormality identified. IMPRESSION: 1. High-grade small-bowel obstruction with an abrupt transition point in the central lower abdomen on series 2, image 69. Favor adhesions. 2. Small volume of free fluid in the abdomen and pelvis, likely reactive and/or related to anasarca - with new left greater than right pleural effusions and bilateral body wall edema. 3. New right double-J ureteral stent with improved appearance of the right kidney. Urinary bladder decompressed by Foley catheter with residual bladder wall thickening. 4. Aortic Atherosclerosis (ICD10-I70.0). Electronically Signed: By: Genevie Ann M.D. On: 06/24/2020 13:36   DG Abd 1 View  Result Date: 06/24/2020 CLINICAL DATA:  NG tube placement EXAM: ABDOMEN - 1 VIEW COMPARISON:  06/23/2020 FINDINGS: Interval placement of nasogastric tube which courses below the diaphragm. Distal tip terminates within the right hemiabdomen, likely within the distal stomach. Dilated loops of small bowel are again seen within the visualized abdomen, similar in caliber to the previous study. Extensive thoracolumbar fusion hardware. IMPRESSION: 1. Interval placement of nasogastric tube with distal tip terminating within the distal stomach. 2. Persistent small bowel obstruction. Electronically Signed   By: Davina Poke D.O.   On: 06/24/2020 14:33   DG Abd 1 View  Result Date: 06/23/2020 CLINICAL DATA:  Pyelonephritis, stent placement EXAM: ABDOMEN - 1 VIEW COMPARISON:  06/17/2020 FINDINGS: Three supine frontal views of the abdomen and pelvis are obtained. Right ureteral stent identified proximal aspect overlying right renal silhouette and distal aspect overlying the lower pelvis. There is diffuse  gaseous distension of the bowel, which may reflect ileus. Maximal diameter of small bowel measures 5.4 cm. There are no masses or abnormal calcifications. Extensive postsurgical changes from thoracolumbar fusion. IMPRESSION: 1. Right ureteral stent as above. 2. Gaseous distention of bowel consistent with small-bowel obstruction or ileus. Electronically Signed   By: Randa Ngo M.D.   On: 06/23/2020 15:21     Medications:   . sodium chloride Stopped (06/22/20 1639)  . [START ON 06/25/2020] ciprofloxacin    . diltiazem (CARDIZEM) infusion    . heparin     . acidophilus  1 capsule Oral Daily  . vitamin C  500 mg Oral Daily  . brimonidine  1 drop Both Eyes BID  . Chlorhexidine Gluconate Cloth  6 each Topical Q0600  . insulin aspart  0-20 Units Subcutaneous TID AC & HS  . mouth rinse  15 mL Mouth Rinse BID  . pantoprazole  40 mg Oral Daily  . Ensure Max Protein  11 oz Oral TID  . sodium chloride flush  10-40 mL Intracatheter Q12H  . sodium chloride flush  3 mL Intravenous Q12H  . tamsulosin  0.4 mg Oral Daily  . vitamin B-12  1,000 mcg Oral Daily  . zinc sulfate  220 mg Oral Daily   sodium chloride, acetaminophen, albuterol, guaiFENesin-dextromethorphan, ondansetron (ZOFRAN) IV, sodium chloride flush  Assessment/ Plan:  Ms. AVAIYAH STRUBEL is a 79 y.o.  female admitted to [facility] for COVID pneumonia. Patient found to have klebsiella sepsis and had right sided pyelonephritis. She underwent right ureteral stent placed on on 9/18.  Patient had IV contrast exposure on 9/17.  Nephrology consulted for worsening kidney function  1. Acute renal failure: with metabolic acidosis Baseline creatinine of 0.9 with normal GFR on 10/16/2019.  Acute renal failure secondary to ATN with sepsis, obstructive uropathy, IV contrast nephropathy and prolonged prerenal azotemia.  - Start sodium bicarbonate infusion - IV albumin due to anasarca - if no improvement, will consider dialysis.   2. Obstructive  uropathy with pyelonephritis with klebsiella. Status post right ureteral stent placement by Dr. Jeffie Pollock on 9/18. - ciprofloxacin  3. Hyponatremia:  Secondary to renal failure - IV fluids as above  4. Hypocalcemia Calcium 6.4 today,corrected Calcium calculated at 8.2 - IV albumin  5. Anemia with renal failure: hemoglobin 9.9. No indication for EPO   Stephanie Dana, Stephanie [name] [facility]  9/24/20212:54 PM

## 2020-06-24 NOTE — Progress Notes (Signed)
Progress Note  Patient Name: Stephanie [name] Date of Encounter: 06/24/2020  [facility] Cardiologist: [facility]  Subjective   Reports that she did not have a very good night, did not feel well Denies any nausea vomiting Abdomen distended Nursing reports no significant bowel movements  X-ray abdomen with gaseous distention concerning for small bowel obstruction or ileus  Renal ultrasound September 22 no hydronephrosis Nonspecific acute renal parenchymal disease  Still with minimal urine output, hematuria in [place]  Telemetry reviewed maintaining normal sinus rhythm  Inpatient Medications    Scheduled Meds: . acidophilus  1 capsule Oral Daily  . apixaban  2.5 mg Oral BID  . vitamin C  500 mg Oral Daily  . brimonidine  1 drop Both Eyes BID  . Chlorhexidine Gluconate Cloth  6 each Topical Q0600  . ciprofloxacin  500 mg Oral Daily  . diltiazem  120 mg Oral Daily  . insulin aspart  0-20 Units Subcutaneous TID AC & HS  . mouth rinse  15 mL Mouth Rinse BID  . metoprolol tartrate  100 mg Oral BID  . pantoprazole  40 mg Oral Daily  . Ensure Max Protein  11 oz Oral TID  . rosuvastatin  10 mg Oral Daily  . sodium chloride flush  10-40 mL Intracatheter Q12H  . sodium chloride flush  3 mL Intravenous Q12H  . tamsulosin  0.4 mg Oral Daily  . vitamin B-12  1,000 mcg Oral Daily  . zinc sulfate  220 mg Oral Daily   Continuous Infusions: . sodium chloride Stopped (06/22/20 1639)   PRN Meds: sodium chloride, acetaminophen, albuterol, guaiFENesin-dextromethorphan, ondansetron (ZOFRAN) IV, sodium chloride flush   Vital Signs    Vitals:   06/23/20 2017 06/24/20 0032 06/24/20 0542 06/24/20 0745  BP: 121/69 120/80 131/66 131/67  Pulse: 76 77 73 74  Resp: 18 16 16 20   Temp: 97.7 F (36.5 C) 97.6 F (36.4 C) 97.7 F (36.5 C) 98.1 F (36.7 C)  TempSrc: Oral Oral Oral   SpO2: 96% 95% 96% 97%  Weight:      Height:        Intake/Output Summary (Last 24 hours) at 06/24/2020  1113 Last data filed at 06/23/2020 1800 Gross per 24 hour  Intake 400 ml  Output --  Net 400 ml   Last 3 Weights 06/22/2020 06/21/2020 06/19/2020  Weight (lbs) 191 lb 6.4 oz 181 lb 3.2 oz 177 lb 4 oz  Weight (kg) 86.818 kg 82.192 kg 80.4 kg      Telemetry    NSR - Personally Reviewed  ECG     - Personally Reviewed  Physical Exam   GEN: No acute distress.   Neck: No JVD Cardiac: RRR, no murmurs, rubs, or gallops.  Respiratory: Clear to auscultation bilaterally. GI: Soft, nontender, abdomen more distended MS: No edema; No deformity. Neuro:  Nonfocal  Psych: Normal affect   Labs    High Sensitivity Troponin:   Recent Labs  Lab 06/17/20 0357 06/17/20 1101  TROPONINIHS 61* 71*      Chemistry Recent Labs  Lab 06/18/20 0809 06/18/20 0809 06/19/20 0537 06/20/20 0841 06/22/20 0543 06/23/20 0534 06/23/20 0649 06/24/20 0610  NA 135   < > 134*   < > 128* 126*  --  124*  K 3.3*   < > 3.6   < > 3.5 4.6  --  4.4  CL 106   < > 101   < > 100 100  --  98  CO2 20*   < >  21*   < > 14* 12*  --  12*  GLUCOSE 97   < > 140*   < > 131* 132*  --  129*  BUN 28*   < > 30*   < > 54* 65*  --  87*  CREATININE 1.75*   < > 2.28*   < > 4.06* 4.77*  --  5.39*  CALCIUM 8.1*   < > 7.8*   < > 6.5* 6.4*  --  6.4*  PROT 5.3*  --  5.9*  --   --   --   --   --   ALBUMIN 2.2*  --  2.3*  --   --   --  1.7*  --   AST 26  --  30  --   --   --   --   --   ALT 15  --  15  --   --   --   --   --   ALKPHOS 84  --  110  --   --   --   --   --   BILITOT 0.6  --  0.5  --   --   --   --   --   GFRNONAA 28*   < > 20*   < > 10* 8*  --  7*  GFRAA 32*   < > 23*   < > 12* 10*  --  8*  ANIONGAP 9   < > 12   < > 14 14  --  14   < > = values in this interval not displayed.     Hematology Recent Labs  Lab 06/22/20 0543 06/23/20 0534 06/24/20 0610  WBC 14.8* 13.3* 13.1*  RBC 3.69* 3.66* 3.53*  HGB 10.1* 9.9* 9.9*  HCT 29.7* 28.4* 27.0*  MCV 80.5 77.6* 76.5*  MCH 27.4 27.0 28.0  MCHC 34.0 34.9 36.7*   RDW 15.9* 16.6* 16.3*  PLT 105* 153 175    BNP Recent Labs  Lab 06/18/20 0809  BNP 410.1*     DDimer No results for input(s): DDIMER in the last 168 hours.   Radiology    DG Abd 1 View  Result Date: 06/23/2020 CLINICAL DATA:  Pyelonephritis, stent placement EXAM: ABDOMEN - 1 VIEW COMPARISON:  06/17/2020 FINDINGS: Three supine frontal views of the abdomen and pelvis are obtained. Right ureteral stent identified proximal aspect overlying right renal silhouette and distal aspect overlying the lower pelvis. There is diffuse gaseous distension of the bowel, which may reflect ileus. Maximal diameter of small bowel measures 5.4 cm. There are no masses or abnormal calcifications. Extensive postsurgical changes from thoracolumbar fusion. IMPRESSION: 1. Right ureteral stent as above. 2. Gaseous distention of bowel consistent with small-bowel obstruction or ileus. Electronically Signed   By: Randa Ngo M.D.   On: 06/23/2020 15:21    Cardiac Studies     Patient Profile     79 y.o. female with history of hypertension, diabetes, hyperlipidemia, rheumatoid arthritis, chronic urinary retention requiring self-catheterization presenting with fevers chills, diagnosed with sepsis likely from urinary origin, underwent right renal stent placement due to obstruction.  Patient being seen for new onset atrial fibrillation with rapid ventricular response.  Assessment & Plan    Obstructive uropathy with pyelonephritis, Klebsiella Right ureteral stent placement September 18 Hematuria noted, she is on broad-spectrum antibiotics No improvement in renal function with IV fluids Renal ultrasound reviewed, no hydronephrosis Suspect ATN  Acute renal failure/ATN In the setting of  sepsis, obstructive uropathy, IV contrast nephropathy, Baseline creatinine 0.10 October 2019 Lab work today creatinine >5 Worsening hyponatremia Nephrology following  Ileus/small bowel obstruction Now n.p.o., Nursing reports  only mucus coming out, no bowel movements Suspect oral medications may not be getting absorbed well  Persistent atrial fibrillation On Cardizem,  metoprolol tartrate, Maintaining normal sinus rhythm but suspect medications may not be getting absorbed --- We will discuss with team, consider stopping oral cardiac medications and changing to Cardizem infusion with heparin infusion -Would restart oral medications only when eating and having bowel movements  Anemia Drop in counts over the past week, hematuria noted On Eliquis, Hemoglobin 11.8 down to 9.9 --Poor nutrition, will be high risk for iron deficiency and blood loss  Sepsis Klebsiella urinary tract infection with urinary obstruction On broad-spectrum antibiotics ATN  COVID-19 pneumonia Patient'sCOVID-19 PCR test was positive on 06/09/20.   Remdesivir started on admission Denies any respiratory distress  Hematuria Renal ultrasound with no hydronephrosis Being treated for Klebsiella infection  Long discussion with patient, nursing, discussed with nephrology, medicine service  Total encounter time more than 35 minutes  Greater than 50% was spent in counseling and coordination of care with the patient    For questions or updates, please contact [place] Please consult [email or web address] for contact info under        Signed, Ida Rogue, MD  06/24/2020, 11:13 AM

## 2020-06-24 NOTE — Progress Notes (Signed)
PT Cancellation Note  Patient Details Name: Stephanie [name] MRN: [id number] DOB: 01-07-1941   Cancelled Treatment:     PT attempt. Pt just returned to floor from [place] and has transfer orders to move to PCU. Will re-evaluate once more appropriate to participate.    Willette Pa 06/24/2020, 1:17 PM

## 2020-06-24 NOTE — TOC Progression Note (Signed)
Transition of Care [facility]) - Progression Note    Patient Details  Name: Stephanie [name] MRN: [id number] Date of Birth: Oct 15, 1940  Transition [facility]) CM/SW Contact  Meriel Flavors, LCSW Phone Number: 06/24/2020, 11:58 AM  Clinical Narrative:    As per Dr. Billie Ruddy, patient and spouse now agree to SNF placement at discharge    Expected Discharge Plan: [place] Barriers to Discharge: Continued Medical Work up  Expected Discharge Plan and Services Expected Discharge Plan: [place] In-house Referral: NA   Post Acute Care Choice: NA Living arrangements for the past 2 months: Single Family Home                                       Social Determinants of Health (SDOH) Interventions    Readmission Risk Interventions Readmission Risk Prevention Plan 06/21/2020 06/19/2020  Transportation Screening Complete Complete  PCP or Specialist Appt within 3-5 Days Complete -  HRI or Home Care Consult Complete -  Social Work Consult for [place] Planning/Counseling Complete -  Palliative Care Screening Not Applicable -  Medication Review [employer]) (No Data) Complete  PCP or Specialist appointment within 3-5 days of discharge - Complete  HRI or [place] - Complete  SW Recovery Care/Counseling Consult - Complete  [place] - Not Applicable  Some recent data might be hidden

## 2020-06-24 NOTE — Progress Notes (Signed)
PROGRESS NOTE    Stephanie [name]  YNW:[id number] DOB: 11/04/1940 DOA: 06/17/2020 PCP: Rusty Aus, MD    Assessment & Plan:   Principal Problem:   Sepsis [facility]) Active Problems:   Benign essential hypertension   Chronic cystitis   Rheumatoid arthritis involving multiple sites with positive rheumatoid factor ([place])   Diabetes mellitus ([place])   Rheumatoid arthritis ([place])   COVID-19 virus infection   AKI (acute kidney injury) ([place])   Atrial fibrillation with rapid ventricular response ([place])   Essential hypertension   Pure hypercholesterolemia   Acute pyelonephritis   Hydronephrosis, right   Lactic acid acidosis   Hyperglycemia   Hx of essential hypertension   Immunosuppression due to drug therapy   Pressure injury of skin   Acquired thrombophilia ([place])   On continuous oral anticoagulation    Stephanie [name] is a 79 y.o. female with medical history significant for COVID-19 viral infection (positive PCR test June 09, 2020), history of diabetes mellitus, history of rheumatoid arthritis on chronic immunosuppressive therapy, hypertension and a remote history of colon cancer who was brought into the ER by EMS for evaluation of fever.  Patient had a T-max of 104F at home.  She complains of generalized weakness and worsening shortness of breath but denies having any nausea, no vomiting, no chest pain or abdominal pain.  Patient does self caths at home. Patient is fully vaccinated She was started on prednisone by her primary care provider for presumed COVID-19 bronchitis   # SBO --Increasing abdominal distention and mild tenderness.  No N/V.  KUB showed SBO. PLAN: --CT a/p with oral contrast showed "High-grade small-bowel obstruction with an abrupt transition point in the central lower abdomen. Favor Adhesions." --GenSurg consult today, rec NG tube for decompression  # Sepsis 2/2 Klebsiella bacteremia and  # right pyelonephritis  --As evidenced by fever with a T-max of 103F,  tachycardia, tachypnea, leukocytosis of 20,000 with a left shift and lactic acid of 3.1.  UA positive for infection.  Pt started on ceftriaxone, then broadened to zosyn on 9/19.  De-escalated to IV cipro on 9/23. --CT renal showed "Debris present dependently within the right renal collecting system and bladder"   --Pt received 2L LR f/b MIVF --Urine grew pan-sensitive Klebsiella. --blood cx 2/2 pos for Klebsiella, repeat blood cx obtained on 9/21, neg growth so far. PLAN:  --switch to IV cipro due to SBO, renal dosing  # Severe right hydronephrosis --CT renal showed "Progressive right UPJ obstruction compared to prior October of 2020." --right stent placement by urology 9/18 (found frank pus in the kidney) --US renal 9/20 showed resolved hydro PLAN: --continue Foley  --strict I/O  # New onset Afib w RVR, persistent # Acquired thrombophilia  --developed while in the ED.  HR 120's-140's despite multiple doses of IV dilt, IV metop, dilt gtt@15   --started heparin gtt 9/18, transitioned to Eliquis  --It appeared that her right hand IV was infiltrated and all the IV dilt, IV metop and IV amiodarone may have not gotten into her system. --cardiology consulted --rate controlled on regimen of lopressor 200 mg BID and Cardizem 120 mg daily PLAN: --switch to cardizem gtt today due to SBO --switch to heparin gtt for anticoagulation due to SBO  # Hematuria --noted on 9/23.  Hgb stable.  Pt needs to be on anticoagulation due to Afib. --Monitor Hgb  # COVID-19 infection --Pt is fully vaccinated.  Patient's COVID-19 PCR test was positive on 06/09/20.  No overt respiratory symptoms.  CT chest clear.  No real O2 requirement on presentation.  Currently on room air. --Remdesivir started on admission PLAN: --continue Remdesivir --according to infection control, due to pt's immunosuppressed status PTA, pt needs the 21 day isolation.  AKI, worsening Metabolic acidosis, uncmopensated --on admission  creatinine is 1.5.  Baseline Cr 0.8.  Acute renal failure secondary to ATN with sepsis, obstructive uropathy, IV contrast nephropathy and prolonged prerenal azotemia.  --US renal showed resolved hydro and medical renal disease --nephrology consulted --bicarb low at 12 today.  ABG showed acidosis with pH 7.26, and pCO2 <19 PLAN: --start sodium bicarb D5 infusion @100  ml/hr, per nephrology  Lactic acidosis 2/2 sepsis --resolved with IVF.  Hx Diabetes mellitus  hyperglycemia related to systemic steroid administration Patient was started on prednisone as an outpatient by her primary care provider for presumed COVID-19 bronchitis Chest x-ray is negative for pneumonia and patient is not hypoxic Prednisone d/c'ed. PLAN: --SSI TID  Hx of HTN --BP drops sometimes from a lot of rate control agents --BP wnl now --Hold home HCTZ --continue cardizem as gtt due to SBO  History of rheumatoid arthritis --continue to hold [place] due to sepsis  Hypokalemia and hypomag, not POA --monitor and replete PRN  HLD --continue home statin  Hyponatremia 2/2 renal failure. --Hold HCTZ  Hypocalcemia Calcium 6.4 today, corrected Calcium calculated at 8.2 --d/c NS --obtain ionized Ca  Anemia with renal failure:  Hgb stable in 10's --Monitor   Hypoalbuminemia  --albumin 1.7 --start IV albumin, per nephrology    DVT prophylaxis: [id number] Code Status: DNR  Family Communication: husband updated on the phone today 06/24/20  Status is: inpatient Dispo:   The patient is from: home Anticipated d/c is to: home Anticipated d/c date is: >3 days Patient currently is not medically stable to d/c due to: worsening AKI, new SBO, on NG tube for decompression.    Subjective and Interval History:  Pt reported mild abdominal pain, more distended.  KUB showed SBO.  Cr continues to worsen.     Objective: Vitals:   06/24/20 0032 06/24/20 0542 06/24/20 0745 06/24/20 1222  BP: 120/80 131/66 131/67  110/79  Pulse: 77 73 74 67  Resp: 16 16 20  (!) 21  Temp: 97.6 F (36.4 C) 97.7 F (36.5 C) 98.1 F (36.7 C) (!) 97.5 F (36.4 C)  TempSrc: Oral Oral  Oral  SpO2: 95% 96% 97% 97%  Weight:      Height:        Intake/Output Summary (Last 24 hours) at 06/24/2020 1615 Last data filed at 06/23/2020 1800 Gross per 24 hour  Intake 400 ml  Output 15 ml  Net 385 ml   Filed Weights   06/19/20 0625 06/21/20 0125 06/22/20 0453  Weight: 80.4 kg 82.2 kg 86.8 kg    Examination:   Constitutional: NAD, AAOx3 HEENT: conjunctivae and lids normal, EOMI CV: RRR no M,R,G. Distal pulses +2.  No cyanosis.   RESP: scatter rhonchi, on RA GI: No BS, abdomen distended Extremities: No effusions, edema in BLE.  Right hand erythema without warmth SKIN: warm, dry and intact Neuro: II - XII grossly intact.  Sensation intact  Foley present, draining urine with some debris sediments.  Less bloody today.   Data Reviewed: I have personally reviewed following labs and imaging studies  CBC: Recent Labs  Lab 06/18/20 0809 06/18/20 0809 06/19/20 0537 06/19/20 0537 06/20/20 0841 06/21/20 0552 06/22/20 0543 06/23/20 0534 06/24/20 0610  WBC 13.7*   < > 13.5*   < >  12.4* 12.9* 14.8* 13.3* 13.1*  NEUTROABS 10.7*  --  11.8*  --   --   --   --   --   --   HGB 11.1*   < > 10.5*   < > 10.4* 10.2* 10.1* 9.9* 9.9*  HCT 31.5*   < > 31.2*   < > 29.6* 29.3* 29.7* 28.4* 27.0*  MCV 80.6   < > 83.6   < > 80.2 80.3 80.5 77.6* 76.5*  PLT 97*   < > 96*   < > 94* 93* 105* 153 175   < > = values in this interval not displayed.   Basic Metabolic Panel: Recent Labs  Lab 06/18/20 0809 06/19/20 0537 06/20/20 0841 06/21/20 0552 06/22/20 0543 06/23/20 0534 06/24/20 0610  NA 135   < > 131* 128* 128* 126* 124*  K 3.3*   < > 2.6* 3.2* 3.5 4.6 4.4  CL 106   < > 99 99 100 100 98  CO2 20*   < > 19* 16* 14* 12* 12*  GLUCOSE 97   < > 155* 158* 131* 132* 129*  BUN 28*   < > 33* 43* 54* 65* 87*  CREATININE 1.75*   < >  2.93* 3.79* 4.06* 4.77* 5.39*  CALCIUM 8.1*   < > 7.0* 6.8* 6.5* 6.4* 6.4*  MG 1.8  --  1.7 1.7 1.7 1.7 1.5*  PHOS 2.9  --   --   --   --   --   --    < > = values in this interval not displayed.   GFR: Estimated Creatinine Clearance: 9.7 mL/min (A) (by C-G formula based on SCr of 5.39 mg/dL (H)). Liver Function Tests: Recent Labs  Lab 06/18/20 0809 06/19/20 0537 06/23/20 0649  AST 26 30  --   ALT 15 15  --   ALKPHOS 84 110  --   BILITOT 0.6 0.5  --   PROT 5.3* 5.9*  --   ALBUMIN 2.2* 2.3* 1.7*   No results for input(s): LIPASE, AMYLASE in the last 168 hours. No results for input(s): AMMONIA in the last 168 hours. Coagulation Profile: Recent Labs  Lab 06/18/20 2048 06/24/20 1323  INR 1.2 2.0*   Cardiac Enzymes: No results for input(s): CKTOTAL, CKMB, CKMBINDEX, TROPONINI in the last 168 hours. BNP (last 3 results) No results for input(s): PROBNP in the last 8760 hours. HbA1C: No results for input(s): HGBA1C in the last 72 hours. CBG: Recent Labs  Lab 06/23/20 1144 06/23/20 1556 06/23/20 2127 06/24/20 0744 06/24/20 1221  GLUCAP 161* 134* 120* 149* 114*   Lipid Profile: No results for input(s): CHOL, HDL, LDLCALC, TRIG, CHOLHDL, LDLDIRECT in the last 72 hours. Thyroid Function Tests: No results for input(s): TSH, T4TOTAL, FREET4, T3FREE, THYROIDAB in the last 72 hours. Anemia Panel: No results for input(s): VITAMINB12, FOLATE, FERRITIN, TIBC, IRON, RETICCTPCT in the last 72 hours. Sepsis Labs: Recent Labs  Lab 06/17/20 1935 06/17/20 2217 06/24/20 1323  LATICACIDVEN 2.8* 1.6 0.9    Recent Results (from the past 240 hour(s))  Blood culture (routine x 2)     Status: Abnormal   Collection Time: 06/17/20  3:57 AM   Specimen: BLOOD  Result Value Ref Range Status   Specimen Description   Final    BLOOD LEFT HAND Performed at [facility], [address], [place], [place] [address]    Special Requests   Final    BOTTLES DRAWN AEROBIC AND  ANAEROBIC Blood Culture adequate  volume Performed at [facility], [address] [place], [place] [address]    Culture  Setup Time   Final    Organism ID to follow GRAM NEGATIVE RODS IN BOTH AEROBIC AND ANAEROBIC BOTTLES CRITICAL RESULT CALLED TO, READ BACK BY AND VERIFIED WITH: AMY THOMPSON AT 3329 ON 06/17/20 SNG Performed at [facility], [address] [place], [place] [address]    Culture KLEBSIELLA PNEUMONIAE (A)  Final   Report Status 06/19/2020 FINAL  Final   Organism ID, Bacteria KLEBSIELLA PNEUMONIAE  Final      Susceptibility   Klebsiella pneumoniae - MIC*    AMPICILLIN RESISTANT Resistant     CEFAZOLIN <=4 SENSITIVE Sensitive     CEFEPIME <=0.12 SENSITIVE Sensitive     CEFTAZIDIME <=1 SENSITIVE Sensitive     CEFTRIAXONE <=0.25 SENSITIVE Sensitive     CIPROFLOXACIN <=0.25 SENSITIVE Sensitive     GENTAMICIN <=1 SENSITIVE Sensitive     IMIPENEM <=0.25 SENSITIVE Sensitive     TRIMETH/SULFA <=20 SENSITIVE Sensitive     AMPICILLIN/SULBACTAM 4 SENSITIVE Sensitive     PIP/TAZO <=4 SENSITIVE Sensitive     * KLEBSIELLA PNEUMONIAE  Blood culture (routine x 2)     Status: Abnormal   Collection Time: 06/17/20  3:57 AM   Specimen: BLOOD  Result Value Ref Range Status   Specimen Description   Final    BLOOD RIGHT [facility] Performed at [facility], [address], [place], [place] [address]    Special Requests   Final    BOTTLES DRAWN AEROBIC AND ANAEROBIC Blood Culture adequate volume Performed at [facility], [address] [place], [place] [address]    Culture  Setup Time   Final    GRAM NEGATIVE RODS IN BOTH AEROBIC AND ANAEROBIC BOTTLES CRITICAL VALUE NOTED.  VALUE IS CONSISTENT WITH PREVIOUSLY REPORTED AND CALLED VALUE. Performed at [facility], [address] [place], [place] [address]    Culture (A)  Final    KLEBSIELLA PNEUMONIAE SUSCEPTIBILITIES PERFORMED ON PREVIOUS CULTURE WITHIN THE LAST 5 DAYS. Performed at  [facility], [place] [address], [place], [place] [address]    Report Status 06/19/2020 FINAL  Final  Blood Culture ID Panel (Reflexed)     Status: Abnormal   Collection Time: 06/17/20  3:57 AM  Result Value Ref Range Status   Enterococcus faecalis NOT DETECTED NOT DETECTED Final   Enterococcus Faecium NOT DETECTED NOT DETECTED Final   Listeria monocytogenes NOT DETECTED NOT DETECTED Final   Staphylococcus species NOT DETECTED NOT DETECTED Final   Staphylococcus aureus (BCID) NOT DETECTED NOT DETECTED Final   Staphylococcus epidermidis NOT DETECTED NOT DETECTED Final   Staphylococcus lugdunensis NOT DETECTED NOT DETECTED Final   Streptococcus species NOT DETECTED NOT DETECTED Final   Streptococcus agalactiae NOT DETECTED NOT DETECTED Final   Streptococcus pneumoniae NOT DETECTED NOT DETECTED Final   Streptococcus pyogenes NOT DETECTED NOT DETECTED Final   A.calcoaceticus-baumannii NOT DETECTED NOT DETECTED Final   Bacteroides fragilis NOT DETECTED NOT DETECTED Final   Enterobacterales DETECTED (A) NOT DETECTED Final    Comment: Enterobacterales represent a large order of gram negative bacteria, not a single organism. CRITICAL RESULT CALLED TO, READ BACK BY AND VERIFIED WITH: AMY THOMPSON AT 2202 ON 06/17/20 SNG    Enterobacter cloacae complex NOT DETECTED NOT DETECTED Final   Escherichia coli NOT DETECTED NOT DETECTED Final   Klebsiella aerogenes NOT DETECTED NOT DETECTED Final   Klebsiella oxytoca NOT DETECTED NOT DETECTED  Final   Klebsiella pneumoniae DETECTED (A) NOT DETECTED Final    Comment: CRITICAL RESULT CALLED TO, READ BACK BY AND VERIFIED WITH: AMY THOMPSON RN AT 8563 ON 06/17/20 SNG    Proteus species NOT DETECTED NOT DETECTED Final   Salmonella species NOT DETECTED NOT DETECTED Final   Serratia marcescens NOT DETECTED NOT DETECTED Final   Haemophilus influenzae NOT DETECTED NOT DETECTED Final   Neisseria meningitidis NOT DETECTED NOT DETECTED Final   Pseudomonas  aeruginosa NOT DETECTED NOT DETECTED Final   Stenotrophomonas maltophilia NOT DETECTED NOT DETECTED Final   Candida albicans NOT DETECTED NOT DETECTED Final   Candida auris NOT DETECTED NOT DETECTED Final   Candida glabrata NOT DETECTED NOT DETECTED Final   Candida krusei NOT DETECTED NOT DETECTED Final   Candida parapsilosis NOT DETECTED NOT DETECTED Final   Candida tropicalis NOT DETECTED NOT DETECTED Final   Cryptococcus neoformans/gattii NOT DETECTED NOT DETECTED Final   CTX-M ESBL NOT DETECTED NOT DETECTED Final   Carbapenem resistance IMP NOT DETECTED NOT DETECTED Final   Carbapenem resistance KPC NOT DETECTED NOT DETECTED Final   Carbapenem resistance NDM NOT DETECTED NOT DETECTED Final   Carbapenem resist OXA 48 LIKE NOT DETECTED NOT DETECTED Final   Carbapenem resistance VIM NOT DETECTED NOT DETECTED Final    Comment: Performed at [facility], [address], [place], [place] [address]  Urine Culture     Status: Abnormal   Collection Time: 06/17/20 11:01 AM   Specimen: Urine, Random  Result Value Ref Range Status   Specimen Description   Final    URINE, RANDOM Performed at [facility], [address] [place], [place] [address]    Special Requests   Final    NONE Performed at [facility], [address] [place], [place] [address]    Culture >=100,000 COLONIES/mL KLEBSIELLA PNEUMONIAE (A)  Final   Report Status 06/19/2020 FINAL  Final   Organism ID, Bacteria KLEBSIELLA PNEUMONIAE (A)  Final      Susceptibility   Klebsiella pneumoniae - MIC*    AMPICILLIN RESISTANT Resistant     CEFAZOLIN <=4 SENSITIVE Sensitive     CEFTRIAXONE <=0.25 SENSITIVE Sensitive     CIPROFLOXACIN <=0.25 SENSITIVE Sensitive     GENTAMICIN <=1 SENSITIVE Sensitive     IMIPENEM <=0.25 SENSITIVE Sensitive     NITROFURANTOIN 32 SENSITIVE Sensitive     TRIMETH/SULFA <=20 SENSITIVE Sensitive     AMPICILLIN/SULBACTAM 4 SENSITIVE Sensitive     PIP/TAZO <=4 SENSITIVE  Sensitive     * >=100,000 COLONIES/mL KLEBSIELLA PNEUMONIAE  Culture, blood (Routine X 2) w Reflex to ID Panel     Status: None (Preliminary result)   Collection Time: 06/21/20 11:07 AM   Specimen: BLOOD  Result Value Ref Range Status   Specimen Description BLOOD RIGHT [facility]  Final   Special Requests   Final    BOTTLES DRAWN AEROBIC AND ANAEROBIC Blood Culture adequate volume   Culture   Final    NO GROWTH 3 DAYS Performed at [facility], [address] [place], [place] [address]    Report Status PENDING  Incomplete  Culture, blood (Routine X 2) w Reflex to ID Panel     Status: None (Preliminary result)   Collection Time: 06/21/20 11:09 AM   Specimen: BLOOD  Result Value Ref Range Status   Specimen Description BLOOD [place]  Final   Special Requests   Final    BOTTLES DRAWN AEROBIC AND ANAEROBIC Blood Culture adequate  volume   Culture   Final    NO GROWTH 3 DAYS Performed at [facility], [address] [place], [place] [address]    Report Status PENDING  Incomplete      Radiology Studies: CT ABDOMEN PELVIS WO CONTRAST  Addendum Date: 06/24/2020   ADDENDUM REPORT: 06/24/2020 14:16 ADDENDUM: Study discussed by telephone with Dr. Dahlia Byes on 06/24/2020 at 14:13. He advises that the patient is not an ideal surgical candidate at this time, and I agree that a trial of conservative treatment for the presumed adhesion related small-bowel obstruction seems reasonable. I reiterated that this does not appear to be a closed loop obstruction, and that no portal venous gas or other complicating features are identified. Electronically Signed   By: Genevie Ann M.D.   On: 06/24/2020 14:16   Result Date: 06/24/2020 CLINICAL DATA:  80 year old female with abnormal urinary tracks on recent noncontrast CT. Abnormal bowel-gas pattern on KUB yesterday suspicious for small bowel obstruction or ileus. EXAM: CT ABDOMEN AND PELVIS WITHOUT CONTRAST TECHNIQUE: Multidetector CT imaging of the  abdomen and pelvis was performed following the standard protocol without IV contrast. COMPARISON:  Noncontrast CT Abdomen and Pelvis 06/17/2020. KUB yesterday. FINDINGS: Lower chest: Small layering left pleural effusion is new from the prior CT, along with trace right pleural fluid. Mild lower lobe atelectasis on the left. No pericardial effusion. Calcified aortic atherosclerosis. Hepatobiliary: Small volume simple appearing perihepatic free fluid is new. The gallbladder is moderately distended with evidence of dependent sludge, but no convincing pericholecystic inflammation. Negative noncontrast liver. Pancreas: Negative. Spleen: Negative. Adrenals/Urinary Tract: Normal adrenal glands. Stable left kidney and ureter. New right double-J ureteral stent in place. Proximal and distal pigtails appear well position. Regressed right hydronephrosis and right perinephric space edema/inflammation. Decompressed urinary bladder now by Foley catheter, although there is residual bladder wall thickening. Stomach/Bowel: Rectosigmoid anastomosis in the pelvis with no adverse features. Superimposed sigmoid descending and asked him oasis also with no adverse features. The large bowel is decompressed throughout. However, oral contrast has reached the ascending colon. Air and contrast distend the stomach, duodenum, and proximal small bowel loops, with the leading edge of small bowel contrast trailing off in mid jejunum and continued gas and fluid distended small bowel loops to the level of an above an abrupt transition in the central lower abdomen seen on series 2, image 69. Distal to that small bowel is decompressed and not opacified with contrast to the terminal ileum. There is a small volume of interloop fluid in the small bowel mesentery. No free air. Only mild small bowel wall thickening is identified. Vascular/Lymphatic: Extensive Aortoiliac calcified atherosclerosis. Vascular patency is not evaluated in the absence of IV  contrast. No lymphadenopathy identified. Reproductive: Diminutive or absent uterus and ovaries as before. Other: Small volume of free fluid in the pelvis is new with simple fluid density (series 2, image 79). Bilateral flank and lower abdominal wall subcutaneous edema. Musculoskeletal: Extensive previous posterior thoracolumbar spinal fusion which extends to the iliac wings appears stable. No acute osseous abnormality identified. IMPRESSION: 1. High-grade small-bowel obstruction with an abrupt transition point in the central lower abdomen on series 2, image 69. Favor adhesions. 2. Small volume of free fluid in the abdomen and pelvis, likely reactive and/or related to anasarca - with new left greater than right pleural effusions and bilateral body wall edema. 3. New right double-J ureteral stent with improved appearance of the right kidney. Urinary bladder decompressed by Foley catheter with residual bladder wall thickening.  4. Aortic Atherosclerosis (ICD10-I70.0). Electronically Signed: By: Genevie Ann M.D. On: 06/24/2020 13:36   DG Abd 1 View  Result Date: 06/24/2020 CLINICAL DATA:  NG tube placement EXAM: ABDOMEN - 1 VIEW COMPARISON:  06/23/2020 FINDINGS: Interval placement of nasogastric tube which courses below the diaphragm. Distal tip terminates within the right hemiabdomen, likely within the distal stomach. Dilated loops of small bowel are again seen within the visualized abdomen, similar in caliber to the previous study. Extensive thoracolumbar fusion hardware. IMPRESSION: 1. Interval placement of nasogastric tube with distal tip terminating within the distal stomach. 2. Persistent small bowel obstruction. Electronically Signed   By: Davina Poke D.O.   On: 06/24/2020 14:33   DG Abd 1 View  Result Date: 06/23/2020 CLINICAL DATA:  Pyelonephritis, stent placement EXAM: ABDOMEN - 1 VIEW COMPARISON:  06/17/2020 FINDINGS: Three supine frontal views of the abdomen and pelvis are obtained. Right ureteral  stent identified proximal aspect overlying right renal silhouette and distal aspect overlying the lower pelvis. There is diffuse gaseous distension of the bowel, which may reflect ileus. Maximal diameter of small bowel measures 5.4 cm. There are no masses or abnormal calcifications. Extensive postsurgical changes from thoracolumbar fusion. IMPRESSION: 1. Right ureteral stent as above. 2. Gaseous distention of bowel consistent with small-bowel obstruction or ileus. Electronically Signed   By: Randa Ngo M.D.   On: 06/23/2020 15:21     Scheduled Meds: . acidophilus  1 capsule Oral Daily  . vitamin C  500 mg Oral Daily  . brimonidine  1 drop Both Eyes BID  . Chlorhexidine Gluconate Cloth  6 each Topical Q0600  . insulin aspart  0-20 Units Subcutaneous TID AC & HS  . mouth rinse  15 mL Mouth Rinse BID  . pantoprazole  40 mg Oral Daily  . Ensure Max Protein  11 oz Oral TID  . sodium chloride flush  10-40 mL Intracatheter Q12H  . sodium chloride flush  3 mL Intravenous Q12H  . tamsulosin  0.4 mg Oral Daily  . vitamin B-12  1,000 mcg Oral Daily  . zinc sulfate  220 mg Oral Daily   Continuous Infusions: . sodium chloride Stopped (06/22/20 1639)  . albumin human    . [START ON 06/25/2020] ciprofloxacin    . diltiazem (CARDIZEM) infusion    . heparin    . sodium bicarbonate (isotonic) 150 mEq in D5W 1000 mL infusion       LOS: 7 days     Enzo Bi, MD [facility] If 7PM-7AM, please contact night-coverage 06/24/2020, 4:15 PM

## 2020-06-24 NOTE — Consult Note (Signed)
[facility] SURGICAL CONSULTATION NOTE (initial) - cpt: 24580   HISTORY OF PRESENT ILLNESS (HPI):  79 y.o. female presented to [facility] ED on 09/17 for evaluation of SOB. At the time of presentation to the ED, she reported progressively worsening SOB over the worse of the days prior. She did reportedly test positive for COVID prior to presentation and started on prednisone. Associated progressive weakness as well at home and was found to be febrile to 104 reportedly per EMS on route to the ED. Initially without nausea, emesis, bowel changes, nor abdominal pain. Initial work up revealed lactic acidosis to 3.1, leukocytosis to 20.4K, CTA was concerning for possible pyelonephritis with hydro on the right, and admitted with presumed urosepsis. 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 She underwent cystoscopy and right double-J stent placement on 09/18 with Dr Jeffie Pollock (urology). Cardiology was also consulted on admission secondary to intermittent runs of SVT/Afib RVR, and she was started on B-blocker and anticoagulation held for possible procedures. UCx obtained this admission positive for Klebsiella pneumoniae, and BCx positive for E. Coli and Klebsiella pneumoniae. She remained on Zosyn. Nephrology following throughout admission for AKI, most recent sCr - 5.39, has not required HD. Last night (09/23), patient's RN began to notice abdominal distension and new tenderness to palpation than her previous days. She had been having small BMs that day but these were reportedly mostly mucus. Her distension had persisted into this morning and she is pending CT Abdomen/Pelvis today. She does have a history of abdominal hysterectomy and reported "colon surgery" for a remote history of colon cancer. Most recent labs revealed hypocalcemia to 6.4, persistent elevation in sCr - 5.39, hypomagnesemia to 1.5, stable leukocytosis to 13.1, lactic acid levels pending.   Surgery is  consulted by hospitalist physician Dr. Enzo Bi, MD in this context for evaluation and management of abdominal distension in the setting of hypocalcemia and likely urosepsis.  PAST MEDICAL HISTORY (PMH):  Past Medical History:  Diagnosis Date  . Arthritis   . Asthma   . Cancer ([place]) 1996   colon  . Diabetes mellitus without complication ([employer])   . GERD (gastroesophageal reflux disease)   . Glaucoma   . Hyperlipidemia   . Hypertension   . Lumbar disc disease   . Small bowel obstruction ([place])   . Vitamin B 12 deficiency      PAST SURGICAL HISTORY (Why):  Past Surgical History:  Procedure Laterality Date  . ABDOMINAL HYSTERECTOMY    . BACK SURGERY    . COLON SURGERY    . COLONOSCOPY WITH PROPOFOL N/A 05/22/2017   Procedure: COLONOSCOPY WITH PROPOFOL;  Surgeon: Manya Silvas, MD;  Location: [facility] ENDOSCOPY;  Service: Endoscopy;  Laterality: N/A;  . CYSTOSCOPY WITH STENT PLACEMENT Right 06/18/2020   Procedure: CYSTOSCOPY WITH STENT PLACEMENT;  Surgeon: Irine Seal, MD;  Location: [facility] ORS;  Service: Urology;  Laterality: Right;  . FRACTURE SURGERY    . KNEE ARTHROSCOPY    . LUMBAR LAMINECTOMY       MEDICATIONS:  Prior to Admission medications   Medication Sig Start Date End Date Taking? Authorizing Provider  amLODipine (NORVASC) 10 MG tablet Take 5 mg by mouth 2 (two) times daily.    Yes [provider]  brimonidine (ALPHAGAN) 0.2 % ophthalmic solution Place 1 drop into both eyes 2 (two) times daily. 05/31/20  Yes [provider]  Cranberry 400 MG CAPS Take by mouth. 03/24/20  Yes [provider]  estradiol (ESTRACE) 0.1 MG/GM vaginal cream Place 1 Applicatorful vaginally at bedtime.   Yes [provider]  glipiZIDE (GLUCOTROL) 10 MG tablet Take 10 mg by mouth daily before breakfast.   Yes [provider]  hydrochlorothiazide (HYDRODIURIL) 25 MG tablet Take 25 mg by mouth daily.   Yes [provider]  leflunomide (ARAVA) 10 MG  tablet Take 10 mg by mouth daily.   Yes [provider]  metFORMIN (GLUCOPHAGE-XR) 500 MG 24 hr tablet Take 500 mg by mouth 2 (two) times daily.   Yes [provider]  metoprolol tartrate (LOPRESSOR) 50 MG tablet Take 50 mg by mouth 2 (two) times daily.   Yes [provider]  omeprazole (PRILOSEC) 40 MG capsule Take 40 mg by mouth daily.   Yes [provider]  potassium chloride SA (K-DUR,KLOR-CON) 20 MEQ tablet Take 20 mEq by mouth daily.   Yes [provider]  Probiotic Product (PROBIOTIC-10 PO) Take 1 Dose by mouth daily.    Yes [provider]  rosuvastatin (CRESTOR) 10 MG tablet Take by mouth. 03/24/20  Yes [provider]  tacrolimus (PROTOPIC) 0.1 % ointment Apply 1 application topically 2 (two) times daily as needed (rash).    Yes [provider]  tamsulosin (FLOMAX) 0.4 MG CAPS capsule TAKE 1 CAPSULE(0.4 MG) BY MOUTH DAILY Patient taking differently: Take 0.4 mg by mouth daily.  05/27/20  Yes McGowan, Larene Beach A, PA-C  vitamin B-12 (CYANOCOBALAMIN) 1000 MCG tablet Take 1,000 mcg by mouth daily.   Yes [provider]  CONTOUR TEST test strip 1 each by Other route daily.  03/09/18   [provider]     ALLERGIES:  Allergies  Allergen Reactions  . Ibuprofen Itching  . Indomethacin   . Infliximab   . Methotrexate Derivatives   . Moexipril   . Percocet [Oxycodone-Acetaminophen] Itching  . Naprosyn [Naproxen] Rash     SOCIAL HISTORY:  Social History   Socioeconomic History  . Marital status: Married    Spouse name: Not on file  . Number of children: Not on file  . Years of education: Not on file  . Highest education level: Not on file  Occupational History  . Not on file  Tobacco Use  . Smoking status: Never Smoker  . Smokeless tobacco: Never Used  Vaping Use  . Vaping Use: Never used  Substance and Sexual Activity  . Alcohol use: No  . Drug use: No  . Sexual activity: Yes  Other  Topics Concern  . Not on file  Social History Narrative  . Not on file   Social Determinants of Health   Financial Resource Strain:   . Difficulty of Paying Living Expenses: Not on file  Food Insecurity:   . Worried About Charity fundraiser in the Last Year: Not on file  . Ran Out of Food in the Last Year: Not on file  Transportation Needs:   . Lack of Transportation (Medical): Not on file  . Lack of Transportation (Non-Medical): Not on file  Physical Activity:   . Days of Exercise per Week: Not on file  . Minutes of Exercise per Session: Not on file  Stress:   . Feeling of Stress : Not on file  Social Connections:   . Frequency of Communication with Friends and Family: Not on file  . Frequency of Social Gatherings with Friends and Family: Not on file  . Attends Religious Services: Not on file  . Active Member of Clubs  or Organizations: Not on file  . Attends Archivist Meetings: Not on file  . Marital Status: Not on file  Intimate Partner Violence:   . Fear of Current or Ex-Partner: Not on file  . Emotionally Abused: Not on file  . Physically Abused: Not on file  . Sexually Abused: Not on file     FAMILY HISTORY:  Family History  Problem Relation Age of Onset  . Breast cancer Neg Hx       REVIEW OF SYSTEMS:  Review of Systems  Constitutional: Positive for malaise/fatigue. Negative for chills and fever.  Respiratory: Negative for cough and shortness of breath.   Cardiovascular: Negative for chest pain and palpitations.  Gastrointestinal: Positive for abdominal pain and nausea. Negative for diarrhea and vomiting.       + distension   Genitourinary: Negative for dysuria and urgency.  Neurological: Positive for weakness. Negative for dizziness and headaches.  All other systems reviewed and are negative.   VITAL SIGNS:  Temp:  [97.6 F (36.4 C)-99 F (37.2 C)] 98.1 F (36.7 C) (09/24 0745) Pulse Rate:  [67-77] 74 (09/24 0745) Resp:  [16-20] 20 (09/24  0745) BP: (118-131)/(63-80) 131/67 (09/24 0745) SpO2:  [95 %-98 %] 97 % (09/24 0745)     Height: 5\' 6"  (167.6 cm) Weight: 86.8 kg BMI (Calculated): 30.91   INTAKE/OUTPUT:  09/23 0701 - 09/24 0700 In: 403 [P.O.:400; I.V.:3] Out: -   PHYSICAL EXAM:  Physical Exam Vitals and nursing note reviewed.  Constitutional:      General: She is not in acute distress.    Appearance: She is well-developed. She is not ill-appearing.  HENT:     Head: Normocephalic and atraumatic.  Eyes:     Extraocular Movements: Extraocular movements intact.     Pupils: Pupils are equal, round, and reactive to light.  Cardiovascular:     Rate and Rhythm: Normal rate and regular rhythm.     Heart sounds: No murmur heard.  No friction rub. No gallop.   Pulmonary:     Effort: Pulmonary effort is normal.     Breath sounds: Normal breath sounds. No decreased breath sounds.  Abdominal:     General: A surgical scar is present. There is distension.     Palpations: Abdomen is soft.     Tenderness: There is no abdominal tenderness. There is no guarding or rebound.  Genitourinary:    Comments: Deferred Skin:    General: Skin is warm and dry.     Coloration: Skin is not pale.     Findings: No erythema.  Neurological:     General: No focal deficit present.     Mental Status: She is alert and oriented to person, place, and time.  Psychiatric:        Mood and Affect: Mood normal.        Behavior: Behavior normal.      Labs:  CBC Latest Ref Rng & Units 06/24/2020 06/23/2020 06/22/2020  WBC 4.0 - 10.5 K/uL 13.1(H) 13.3(H) 14.8(H)  Hemoglobin 12.0 - 15.0 g/dL 9.9(L) 9.9(L) 10.1(L)  Hematocrit 36 - 46 % 27.0(L) 28.4(L) 29.7(L)  Platelets 150 - 400 K/uL 175 153 105(L)   CMP Latest Ref Rng & Units 06/24/2020 06/23/2020 06/22/2020  Glucose 70 - 99 mg/dL 129(H) 132(H) 131(H)  BUN 8 - 23 mg/dL 87(H) 65(H) 54(H)  Creatinine 0.44 - 1.00 mg/dL 5.39(H) 4.77(H) 4.06(H)  Sodium 135 - 145 mmol/L 124(L) 126(L) 128(L)  Potassium  3.5 - 5.1 mmol/L 4.4  4.6 3.5  Chloride 98 - 111 mmol/L 98 100 100  CO2 22 - 32 mmol/L 12(L) 12(L) 14(L)  Calcium 8.9 - 10.3 mg/dL 6.4(LL) 6.4(LL) 6.5(L)  Total Protein 6.5 - 8.1 g/dL - - -  Total Bilirubin 0.3 - 1.2 mg/dL - - -  Alkaline Phos 38 - 126 U/L - - -  AST 15 - 41 U/L - - -  ALT 0 - 44 U/L - - -     Imaging studies:   KUB (06/23/2020) personally reviewed showing small bowel dilation, may represent ileus in setting of sepsis and hypocalcemia, and radiologist report reviewed below:  IMPRESSION: 1. Right ureteral stent as above. 2. Gaseous distention of bowel consistent with small-bowel obstruction or ileus.   CT Renal Stone (06/17/2020) personally reviewed showing inflammation surrounding right kidney with associated hydronephrosis, no bowel abnormalities appreciated, and radiologist report reviewed below:  IMPRESSION: 1. 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 2. Debris present dependently within the right renal collecting system and bladder. This may represent thrombus in the setting of hematuria, mucinous debris, or purulent debris if the patient has evidence of an active urinary tract infection. Ultimately, cystoscopy should be considered after resolution of the patient's acute symptoms to exclude the less likely possibility of a bladder wall mass. 3. Bladder distension with air/gas floating in the fundus. This may be secondary to recent instrumentation (catheter insertion), or be reflective of infection with a gas-forming organism. Recommend correlation with urinalysis. 4. Nonspecific perinephric stranding bilaterally consistent with the history of acute kidney injury. 5. Moderate rectal stool burden consistent with constipation. 6. Small hiatal hernia. 7. Aortic and cardiac atherosclerotic vascular calcifications.   Assessment/Plan: (ICD-10's: R14.0) 79 y.o. female with abdominal distension and decrease in bowel function which may  represent paralytic ileus in the setting of significant hypocalcemia and urosepsis. A bowel obstruction is certainly possible given her history of intra-abdominal procedures but this seems less likely at this time, which complicated by pertinent comorbidities including debilitation, numerous comorbid conditions.   - Agree with CT Abdomen/Pelvis to rule out intra-abdominal etiologies of symptoms, obstruction, hernia, masses  - NGT decompression to LIS for symptom control; monitor and record output  - No emergent surgical intervention  - Monitor abdominal examination; on-going bowel function  - Replete electrolytes  - Mobilize as feasible  - Further management per primary service; we will follow   All of the above findings and recommendations were discussed with the patient, and all of patient's questions were answered to her expressed satisfaction.  Thank you for the opportunity to participate in this patient's care.   -- Edison Simon, PA-C [facility] 06/24/2020, 10:26 AM [phone] M-F: 7am - 4pm

## 2020-06-25 ENCOUNTER — Inpatient Hospital Stay: Payer: Medicare Other

## 2020-06-25 DIAGNOSIS — K56609 Unspecified intestinal obstruction, unspecified as to partial versus complete obstruction: Secondary | ICD-10-CM

## 2020-06-25 DIAGNOSIS — N183 Chronic kidney disease, stage 3 unspecified: Secondary | ICD-10-CM

## 2020-06-25 DIAGNOSIS — E1122 Type 2 diabetes mellitus with diabetic chronic kidney disease: Secondary | ICD-10-CM

## 2020-06-25 LAB — BASIC METABOLIC PANEL
Anion gap: 15 (ref 5–15)
BUN: 94 mg/dL — ABNORMAL HIGH (ref 8–23)
CO2: 14 mmol/L — ABNORMAL LOW (ref 22–32)
Calcium: 6.5 mg/dL — ABNORMAL LOW (ref 8.9–10.3)
Chloride: 95 mmol/L — ABNORMAL LOW (ref 98–111)
Creatinine, Ser: 5.96 mg/dL — ABNORMAL HIGH (ref 0.44–1.00)
GFR calc Af Amer: 7 mL/min — ABNORMAL LOW (ref >60)
GFR calc non Af Amer: 6 mL/min — ABNORMAL LOW (ref >60)
Glucose, Bld: 179 mg/dL — ABNORMAL HIGH (ref 70–99)
Potassium: 4 mmol/L (ref 3.5–5.1)
Sodium: 124 mmol/L — ABNORMAL LOW (ref 135–145)

## 2020-06-25 LAB — PROTIME-INR
INR: 2.1 — ABNORMAL HIGH (ref 0.8–1.2)
Prothrombin Time: 22.8 seconds — ABNORMAL HIGH (ref 11.4–15.2)

## 2020-06-25 LAB — BLOOD GAS, ARTERIAL
Acid-base deficit: 10.5 mmol/L — ABNORMAL HIGH (ref 0.0–2.0)
Bicarbonate: 14.2 mmol/L — ABNORMAL LOW (ref 20.0–28.0)
FIO2: 0.21
O2 Saturation: 95.3 %
Patient temperature: 37
pCO2 arterial: 27 mmHg — ABNORMAL LOW (ref 32.0–48.0)
pH, Arterial: 7.33 — ABNORMAL LOW (ref 7.350–7.450)
pO2, Arterial: 83 mmHg (ref 83.0–108.0)

## 2020-06-25 LAB — CBC
HCT: 25.4 % — ABNORMAL LOW (ref 36.0–46.0)
Hemoglobin: 9.4 g/dL — ABNORMAL LOW (ref 12.0–15.0)
MCH: 27.5 pg (ref 26.0–34.0)
MCHC: 37 g/dL — ABNORMAL HIGH (ref 30.0–36.0)
MCV: 74.3 fL — ABNORMAL LOW (ref 80.0–100.0)
Platelets: 184 10*3/uL (ref 150–400)
RBC: 3.42 MIL/uL — ABNORMAL LOW (ref 3.87–5.11)
RDW: 15.9 % — ABNORMAL HIGH (ref 11.5–15.5)
WBC: 11.3 10*3/uL — ABNORMAL HIGH (ref 4.0–10.5)
nRBC: 0 % (ref 0.0–0.2)

## 2020-06-25 LAB — GLUCOSE, CAPILLARY
Glucose-Capillary: 179 mg/dL — ABNORMAL HIGH (ref 70–99)
Glucose-Capillary: 221 mg/dL — ABNORMAL HIGH (ref 70–99)
Glucose-Capillary: 184 mg/dL — ABNORMAL HIGH (ref 70–99)
Glucose-Capillary: 151 mg/dL — ABNORMAL HIGH (ref 70–99)

## 2020-06-25 LAB — APTT
aPTT: 111 seconds — ABNORMAL HIGH (ref 24–36)
aPTT: 67 seconds — ABNORMAL HIGH (ref 24–36)

## 2020-06-25 LAB — HEPARIN LEVEL (UNFRACTIONATED): Heparin Unfractionated: 2.04 IU/mL — ABNORMAL HIGH (ref 0.30–0.70)

## 2020-06-25 LAB — MAGNESIUM: Magnesium: 1.9 mg/dL (ref 1.7–2.4)

## 2020-06-25 MED ORDER — SODIUM CHLORIDE 0.9 % IV SOLN: 250.0000 mL | INTRAVENOUS | Status: DC | PRN

## 2020-06-25 MED ORDER — SODIUM CHLORIDE 0.9 % IV SOLN
2.0000 g | INTRAVENOUS | Status: AC
  Administered 2020-06-26: 10:00:00 2 g via INTRAVENOUS
  Filled 2020-06-25: qty 2

## 2020-06-25 MED ORDER — CHLORHEXIDINE GLUCONATE 0.12 % MT SOLN
15.0000 mL | Freq: Two times a day (BID) | OROMUCOSAL | Status: DC
  Administered 2020-06-26 – 2020-07-10 (×25): 15 mL via OROMUCOSAL
  Filled 2020-06-25 (×25): qty 15

## 2020-06-25 MED ORDER — INSULIN ASPART 100 UNIT/ML ~~LOC~~ SOLN
SUBCUTANEOUS | Status: AC
  Filled 2020-06-25: qty 1

## 2020-06-25 MED ORDER — INSULIN ASPART 100 UNIT/ML ~~LOC~~ SOLN
0.0000 [IU] | SUBCUTANEOUS | Status: DC
  Administered 2020-06-25 – 2020-06-27 (×12): 2 [IU] via SUBCUTANEOUS
  Administered 2020-06-27: 1 [IU] via SUBCUTANEOUS
  Administered 2020-06-27: 2 [IU] via SUBCUTANEOUS
  Administered 2020-06-28 (×5): 1 [IU] via SUBCUTANEOUS
  Administered 2020-06-29 (×3): 2 [IU] via SUBCUTANEOUS
  Filled 2020-06-25 (×20): qty 1

## 2020-06-25 NOTE — Progress Notes (Signed)
CC: SBO Subjective:  Okay.  No abdominal pain.  NG tube put out 600 cc.  KUB personally reviewed showing evidence some bowel dilation.  It was only 1 view.  There is no contrast seen likely from resorption. no free air.  Cussed with pharmacy in detail INR is greater than 2 as well as PTT greater than 100. D/W Dr. Juleen China in detail . Placed on bicarb drip, acidosis improving worsening creat.  Objective: Vital signs in last 24 hours: Temp:  [97.5 F (36.4 C)-97.7 F (36.5 C)] 97.7 F (36.5 C) (09/25 0826) Pulse Rate:  [67-70] 70 (09/25 0826) Resp:  [16-21] 20 (09/25 0826) BP: (110-151)/(59-79) 151/67 (09/25 0826) SpO2:  [95 %-98 %] 98 % (09/25 0826) Last BM Date: 06/23/20  Intake/Output from previous day: 09/24 0701 - 09/25 0700 In: 1046.4 [I.V.:946.4; IV Piggyback:100] Out: 1150 [Urine:550; Emesis/NG output:600] Intake/Output this shift: Total I/O In: 13 [I.V.:13] Out: -   Physical exam: NAD, alert Abd: soft, nt mildly distended, decrease bs, no peritonitis Ext: no edema and well perfused  Lab Results: CBC  Recent Labs    06/24/20 0610 06/25/20 0731  WBC 13.1* 11.3*  HGB 9.9* 9.4*  HCT 27.0* 25.4*  PLT 175 184   BMET Recent Labs    06/24/20 0610 06/25/20 0731  NA 124* 124*  K 4.4 4.0  CL 98 95*  CO2 12* 14*  GLUCOSE 129* 179*  BUN 87* 94*  CREATININE 5.39* 5.96*  CALCIUM 6.4* 6.5*   PT/INR Recent Labs    06/24/20 1323 06/25/20 0731  LABPROT 21.6* 22.8*  INR 2.0* 2.1*   ABG Recent Labs    06/24/20 1026  PHART 7.26*  HCO3 8.1*    Studies/Results: CT ABDOMEN PELVIS WO CONTRAST  Addendum Date: 06/24/2020   ADDENDUM REPORT: 06/24/2020 14:16 ADDENDUM: Study discussed by telephone with Dr. Dahlia Byes on 06/24/2020 at 14:13. He advises that the patient is not an ideal surgical candidate at this time, and I agree that a trial of conservative treatment for the presumed adhesion related small-bowel obstruction seems reasonable. I reiterated that this does not  appear to be a closed loop obstruction, and that no portal venous gas or other complicating features are identified. Electronically Signed   By: Genevie Ann M.D.   On: 06/24/2020 14:16   Result Date: 06/24/2020 CLINICAL DATA:  79 year old female with abnormal urinary tracks on recent noncontrast CT. Abnormal bowel-gas pattern on KUB yesterday suspicious for small bowel obstruction or ileus. EXAM: CT ABDOMEN AND PELVIS WITHOUT CONTRAST TECHNIQUE: Multidetector CT imaging of the abdomen and pelvis was performed following the standard protocol without IV contrast. COMPARISON:  Noncontrast CT Abdomen and Pelvis 06/17/2020. KUB yesterday. FINDINGS: Lower chest: Small layering left pleural effusion is new from the prior CT, along with trace right pleural fluid. Mild lower lobe atelectasis on the left. No pericardial effusion. Calcified aortic atherosclerosis. Hepatobiliary: Small volume simple appearing perihepatic free fluid is new. The gallbladder is moderately distended with evidence of dependent sludge, but no convincing pericholecystic inflammation. Negative noncontrast liver. Pancreas: Negative. Spleen: Negative. Adrenals/Urinary Tract: Normal adrenal glands. Stable left kidney and ureter. New right double-J ureteral stent in place. Proximal and distal pigtails appear well position. Regressed right hydronephrosis and right perinephric space edema/inflammation. Decompressed urinary bladder now by Foley catheter, although there is residual bladder wall thickening. Stomach/Bowel: Rectosigmoid anastomosis in the pelvis with no adverse features. Superimposed sigmoid descending and asked him oasis also with no adverse features. The large bowel is decompressed throughout. However, oral  contrast has reached the ascending colon. Air and contrast distend the stomach, duodenum, and proximal small bowel loops, with the leading edge of small bowel contrast trailing off in mid jejunum and continued gas and fluid distended small  bowel loops to the level of an above an abrupt transition in the central lower abdomen seen on series 2, image 69. Distal to that small bowel is decompressed and not opacified with contrast to the terminal ileum. There is a small volume of interloop fluid in the small bowel mesentery. No free air. Only mild small bowel wall thickening is identified. Vascular/Lymphatic: Extensive Aortoiliac calcified atherosclerosis. Vascular patency is not evaluated in the absence of IV contrast. No lymphadenopathy identified. Reproductive: Diminutive or absent uterus and ovaries as before. Other: Small volume of free fluid in the pelvis is new with simple fluid density (series 2, image 79). Bilateral flank and lower abdominal wall subcutaneous edema. Musculoskeletal: Extensive previous posterior thoracolumbar spinal fusion which extends to the iliac wings appears stable. No acute osseous abnormality identified. IMPRESSION: 1. High-grade small-bowel obstruction with an abrupt transition point in the central lower abdomen on series 2, image 69. Favor adhesions. 2. Small volume of free fluid in the abdomen and pelvis, likely reactive and/or related to anasarca - with new left greater than right pleural effusions and bilateral body wall edema. 3. New right double-J ureteral stent with improved appearance of the right kidney. Urinary bladder decompressed by Foley catheter with residual bladder wall thickening. 4. Aortic Atherosclerosis (ICD10-I70.0). Electronically Signed: By: Genevie Ann M.D. On: 06/24/2020 13:36   DG Abd 1 View  Result Date: 06/25/2020 CLINICAL DATA:  Bowel obstruction. EXAM: ABDOMEN - 1 VIEW COMPARISON:  06/24/2020 FINDINGS: An NG tube is again identified with tip overlying the peripyloric region. Decreased bowel distension noted. No new or increasing dilated bowel loops are present. RIGHT ureteral stent and spinal surgical hardware again noted. IMPRESSION: Decreased bowel distension. Electronically Signed   By:  Margarette Canada M.D.   On: 06/25/2020 07:42   DG Abd 1 View  Result Date: 06/24/2020 CLINICAL DATA:  NG tube placement EXAM: ABDOMEN - 1 VIEW COMPARISON:  06/23/2020 FINDINGS: Interval placement of nasogastric tube which courses below the diaphragm. Distal tip terminates within the right hemiabdomen, likely within the distal stomach. Dilated loops of small bowel are again seen within the visualized abdomen, similar in caliber to the previous study. Extensive thoracolumbar fusion hardware. IMPRESSION: 1. Interval placement of nasogastric tube with distal tip terminating within the distal stomach. 2. Persistent small bowel obstruction. Electronically Signed   By: Davina Poke D.O.   On: 06/24/2020 14:33   DG Abd 1 View  Result Date: 06/23/2020 CLINICAL DATA:  Pyelonephritis, stent placement EXAM: ABDOMEN - 1 VIEW COMPARISON:  06/17/2020 FINDINGS: Three supine frontal views of the abdomen and pelvis are obtained. Right ureteral stent identified proximal aspect overlying right renal silhouette and distal aspect overlying the lower pelvis. There is diffuse gaseous distension of the bowel, which may reflect ileus. Maximal diameter of small bowel measures 5.4 cm. There are no masses or abnormal calcifications. Extensive postsurgical changes from thoracolumbar fusion. IMPRESSION: 1. Right ureteral stent as above. 2. Gaseous distention of bowel consistent with small-bowel obstruction or ileus. Electronically Signed   By: Randa Ngo M.D.   On: 06/23/2020 15:21    Anti-infectives: Anti-infectives (From admission, onward)   Start     Dose/Rate Route Frequency Ordered Stop   06/25/20 1000  ciprofloxacin (CIPRO) IVPB 400 mg  400 mg 200 mL/hr over 60 Minutes Intravenous Every 24 hours 06/24/20 1234     06/23/20 1000  ciprofloxacin (CIPRO) tablet 500 mg  Status:  Discontinued        500 mg Oral Daily 06/23/20 0927 06/24/20 1234   06/20/20 1600  piperacillin-tazobactam (ZOSYN) IVPB 3.375 g  Status:   Discontinued        3.375 g 12.5 mL/hr over 240 Minutes Intravenous Every 12 hours 06/20/20 1217 06/23/20 0926   06/19/20 2000  piperacillin-tazobactam (ZOSYN) IVPB 3.375 g  Status:  Discontinued        3.375 g 12.5 mL/hr over 240 Minutes Intravenous Every 8 hours 06/19/20 1915 06/20/20 1217   06/18/20 1800  cefTRIAXone (ROCEPHIN) 2 g in sodium chloride 0.9 % 100 mL IVPB  Status:  Discontinued        2 g 200 mL/hr over 30 Minutes Intravenous Every 24 hours 06/17/20 1545 06/17/20 1545   06/18/20 1000  remdesivir 100 mg in sodium chloride 0.9 % 100 mL IVPB       "Followed by" Linked Group Details   100 mg 200 mL/hr over 30 Minutes Intravenous Daily 06/17/20 0809 06/21/20 0954   06/18/20 0800  azithromycin (ZITHROMAX) 500 mg in sodium chloride 0.9 % 250 mL IVPB  Status:  Discontinued        500 mg 250 mL/hr over 60 Minutes Intravenous Every 24 hours 06/17/20 0805 06/17/20 0914   06/17/20 1800  cefTRIAXone (ROCEPHIN) 2 g in sodium chloride 0.9 % 100 mL IVPB  Status:  Discontinued        2 g 200 mL/hr over 30 Minutes Intravenous Every 24 hours 06/17/20 1545 06/19/20 1915   06/17/20 1500  cefTRIAXone (ROCEPHIN) 2 g in sodium chloride 0.9 % 100 mL IVPB  Status:  Discontinued        2 g 200 mL/hr over 30 Minutes Intravenous Every 24 hours 06/17/20 0805 06/17/20 1545   06/17/20 1000  remdesivir 200 mg in sodium chloride 0.9% 250 mL IVPB       "Followed by" Linked Group Details   200 mg 580 mL/hr over 30 Minutes Intravenous Once 06/17/20 0809 06/17/20 1216   06/17/20 0530  ceFEPIme (MAXIPIME) 1 g in sodium chloride 0.9 % 100 mL IVPB        1 g 200 mL/hr over 30 Minutes Intravenous  Once 06/17/20 0515 06/17/20 0810   06/17/20 0530  vancomycin (VANCOCIN) IVPB 1000 mg/200 mL premix        1,000 mg 200 mL/hr over 60 Minutes Intravenous  Once 06/17/20 0515 06/17/20 0929   06/17/20 0530  azithromycin (ZITHROMAX) tablet 500 mg        500 mg Oral  Once 06/17/20 0515 06/17/20 0723       Assessment/Plan: SBO continue NGT , No surgical intervention X ray in am Interrogate reason for persistent INR elevation, CMP in am Renal failure w acidosis , renal following No need for emergent surgical intervention I spent over 35 minutes in this encounter with greater than 50% spent in coordination and counseling of her care   Caroleen Hamman, MD, FACS  06/25/2020

## 2020-06-25 NOTE — Consult Note (Addendum)
ANTICOAGULATION CONSULT NOTE - Initial Consult  Pharmacy Consult for Heparin Drip Indication:  atrial fibrillation (worsening renal function, stopping eliquis, also with ileus)  Allergies  Allergen Reactions  . Ibuprofen Itching  . Indomethacin   . Infliximab   . Methotrexate Derivatives   . Moexipril   . Percocet [Oxycodone-Acetaminophen] Itching  . Naprosyn [Naproxen] Rash    Patient Measurements: Height: 5\' 6"  (167.6 cm) Weight: 86.8 kg (191 lb 6.4 oz) IBW/kg (Calculated) : 59.3 Heparin Dosing Weight: 75.9kg  Vital Signs: Temp: 97.7 F (36.5 C) (09/25 0826) Temp Source: Oral (09/25 0826) BP: 151/67 (09/25 0826) Pulse Rate: 70 (09/25 0826)  Labs: Recent Labs    06/23/20 0534 06/23/20 0534 06/24/20 0610 06/24/20 1323 06/25/20 0731  HGB 9.9*   < > 9.9*  --  9.4*  HCT 28.4*  --  27.0*  --  25.4*  PLT 153  --  175  --  184  APTT  --   --   --  44* 111*  LABPROT  --   --   --  21.6* 22.8*  INR  --   --   --  2.0* 2.1*  HEPARINUNFRC  --   --   --  2.56*  --   CREATININE 4.77*  --  5.39*  --  5.96*   < > = values in this interval not displayed.    Estimated Creatinine Clearance: 8.8 mL/min (A) (by C-G formula based on SCr of 5.96 mg/dL (H)).   Medical History: Past Medical History:  Diagnosis Date  . Arthritis   . Asthma   . Cancer ([place]) 1996   colon  . Diabetes mellitus without complication ([place])   . GERD (gastroesophageal reflux disease)   . Glaucoma   . Hyperlipidemia   . Hypertension   . Lumbar disc disease   . Small bowel obstruction ([place])   . Vitamin B 12 deficiency     Medications:  Last dose Eliquis as inpatient 9/24 09/07  Assessment: 79 y.o.femalewith history of hypertension, diabetes, hyperlipidemia, rheumatoid arthritis, chronic urinary retention requiring self-catheterization presenting with fevers chills, diagnosed with sepsis likely from urinary origin, underwent right renal stent placement due to obstruction.   Patient being  seen for new onset atrial fibrillation with rapid ventricular response.  Now with possible ileus and worsening Scr, will dc Eliquis and start heparin per pharmacy consult.  9/25 0731 aPTT 111 HL 2.04  Goal of Therapy:  aPTT 66-102 seconds Monitor platelets by anticoagulation protocol: Yes   Plan:  APTT is supratherapeutic. Will decrease heparin infusion to 600 units/hr. Reorder aPTT in 8 hours. Anti-xa and CBC with AM labs. Switch to heparin level monitoring once aPTT and Heparin level correlate.   Oswald Hillock, PharmD, BCPS Clinical Pharmacist 06/25/2020 9:29 AM

## 2020-06-25 NOTE — Progress Notes (Signed)
Patient called out for bedpan. Placed patient on bed pan, about 15 minutes later she called out. Went in and removed bedpan, patient had soft liquid bowel movement. Cleaned patient and placed new pad under patient. Emptied out suction canister, 700cc of brown liquid from NG tube. Patient resting in bed with call bell in reach.

## 2020-06-25 NOTE — Progress Notes (Signed)
[name]  MRN: [id number]  DOB/AGE: [birth year] 79 y.o.  Primary Care Physician:Miller, Christean Grief, MD  Admit date: 06/17/2020  Chief Complaint:  Chief Complaint  Patient presents with  . Shortness of Breath    S-Pt presented on  06/17/2020 with  Chief Complaint  Patient presents with  . Shortness of Breath   Patient offers no specific complaints. No complaint of chest pain/shortness of breath   Medications . acidophilus  1 capsule Oral Daily  . vitamin C  500 mg Oral Daily  . brimonidine  1 drop Both Eyes BID  . Chlorhexidine Gluconate Cloth  6 each Topical Q0600  . insulin aspart  0-20 Units Subcutaneous TID AC & HS  . mouth rinse  15 mL Mouth Rinse BID  . pantoprazole  40 mg Oral Daily  . Ensure Max Protein  11 oz Oral TID  . sodium chloride flush  10-40 mL Intracatheter Q12H  . sodium chloride flush  3 mL Intravenous Q12H  . tamsulosin  0.4 mg Oral Daily  . vitamin B-12  1,000 mcg Oral Daily  . zinc sulfate  220 mg Oral Daily         QIH:KVQQV from the symptoms mentioned above,there are no other symptoms referable to all systems reviewed.  Physical Exam: Vital signs in last 24 hours: Temp:  [97.5 F (36.4 C)-97.7 F (36.5 C)] 97.7 F (36.5 C) (09/25 0826) Pulse Rate:  [67-70] 70 (09/25 0826) Resp:  [16-21] 20 (09/25 0826) BP: (110-151)/(59-79) 151/67 (09/25 0826) SpO2:  [95 %-98 %] 98 % (09/25 0826) Weight change:  Last BM Date: 06/23/20  Intake/Output from previous day: 09/24 0701 - 09/25 0700 In: 1046.4 [I.V.:946.4; IV Piggyback:100] Out: 1150 [Urine:550; Emesis/NG output:600] Total I/O In: 13 [I.V.:13] Out: -    Physical Exam: General- pt is awake,alert, oriented to time place and person HEENT-NG tube in situ Resp- No acute REsp distress, CTA B/L NO Rhonchi CVS- S1S2 regular in rate and rhythm GIT- BS+, soft, NT, ND EXT- NO LE Edema, Cyanosis GU catheter in situ Hematuria better than yesterday   Lab Results: CBC Recent Labs     06/24/20 0610 06/25/20 0731  WBC 13.1* 11.3*  HGB 9.9* 9.4*  HCT 27.0* 25.4*  PLT 175 184    BMET Recent Labs    06/24/20 0610 06/25/20 0731  NA 124* 124*  K 4.4 4.0  CL 98 95*  CO2 12* 14*  GLUCOSE 129* 179*  BUN 87* 94*  CREATININE 5.39* 5.96*  CALCIUM 6.4* 6.5*    Creatinine trend 2021 during this admission 1.5==>5.96  0.9 as baseline  Sodium trend 2021  135==>124   Acid base 124-109=15  Albumin 1.5  Delta anion gap 15-5=10  Delta bicarb 24-14=10  Patient is on bicarb drip  MICRO Recent Results (from the past 240 hour(s))  Blood culture (routine x 2)     Status: Abnormal   Collection Time: 06/17/20  3:57 AM   Specimen: BLOOD  Result Value Ref Range Status   Specimen Description   Final    BLOOD LEFT HAND Performed at [facility], [address], [place], [place] [address]    Special Requests   Final    BOTTLES DRAWN AEROBIC AND ANAEROBIC Blood Culture adequate volume Performed at [facility] [address] [place], [place] [address]    Culture  Setup Time   Final    Organism ID to follow [place]  CALLED TO, READ BACK BY AND VERIFIED WITH: AMY THOMPSON AT 1534 ON 06/17/20 SNG Performed at [facility], [address] [place], [place] [address]    Culture KLEBSIELLA PNEUMONIAE (A)  Final   Report Status 06/19/2020 FINAL  Final   Organism ID, Bacteria KLEBSIELLA PNEUMONIAE  Final      Susceptibility   Klebsiella pneumoniae - MIC*    AMPICILLIN RESISTANT Resistant     CEFAZOLIN <=4 SENSITIVE Sensitive     CEFEPIME <=0.12 SENSITIVE Sensitive     CEFTAZIDIME <=1 SENSITIVE Sensitive     CEFTRIAXONE <=0.25 SENSITIVE Sensitive     CIPROFLOXACIN <=0.25 SENSITIVE Sensitive     GENTAMICIN <=1 SENSITIVE Sensitive     IMIPENEM <=0.25 SENSITIVE Sensitive     TRIMETH/SULFA <=20 SENSITIVE Sensitive     AMPICILLIN/SULBACTAM 4 SENSITIVE Sensitive      PIP/TAZO <=4 SENSITIVE Sensitive     * KLEBSIELLA PNEUMONIAE  Blood culture (routine x 2)     Status: Abnormal   Collection Time: 06/17/20  3:57 AM   Specimen: BLOOD  Result Value Ref Range Status   Specimen Description   Final    BLOOD RIGHT [facility] Performed at [facility], [address] [place], [place] [address]    Special Requests   Final    BOTTLES DRAWN AEROBIC AND ANAEROBIC Blood Culture adequate volume Performed at [facility], [address] [place], [place] [address]    Culture  Setup Time   Final    GRAM NEGATIVE RODS IN BOTH AEROBIC AND ANAEROBIC BOTTLES CRITICAL VALUE NOTED.  VALUE IS CONSISTENT WITH PREVIOUSLY REPORTED AND CALLED VALUE. Performed at [facility], [address] [place], [place] [address]    Culture (A)  Final    KLEBSIELLA PNEUMONIAE SUSCEPTIBILITIES PERFORMED ON PREVIOUS CULTURE WITHIN THE LAST 5 DAYS. Performed at [facility], [place] [address], [place], [place] [address]    Report Status 06/19/2020 FINAL  Final  Blood Culture ID Panel (Reflexed)     Status: Abnormal   Collection Time: 06/17/20  3:57 AM  Result Value Ref Range Status   Enterococcus faecalis NOT DETECTED NOT DETECTED Final   Enterococcus Faecium NOT DETECTED NOT DETECTED Final   Listeria monocytogenes NOT DETECTED NOT DETECTED Final   Staphylococcus species NOT DETECTED NOT DETECTED Final   Staphylococcus aureus (BCID) NOT DETECTED NOT DETECTED Final   Staphylococcus epidermidis NOT DETECTED NOT DETECTED Final   Staphylococcus lugdunensis NOT DETECTED NOT DETECTED Final   Streptococcus species NOT DETECTED NOT DETECTED Final   Streptococcus agalactiae NOT DETECTED NOT DETECTED Final   Streptococcus pneumoniae NOT DETECTED NOT DETECTED Final   Streptococcus pyogenes NOT DETECTED NOT DETECTED Final   A.calcoaceticus-baumannii NOT DETECTED NOT DETECTED Final   Bacteroides fragilis NOT DETECTED NOT DETECTED Final   Enterobacterales DETECTED (A)  NOT DETECTED Final    Comment: Enterobacterales represent a large order of gram negative bacteria, not a single organism. CRITICAL RESULT CALLED TO, READ BACK BY AND VERIFIED WITH: AMY THOMPSON AT 2263 ON 06/17/20 SNG    Enterobacter cloacae complex NOT DETECTED NOT DETECTED Final   Escherichia coli NOT DETECTED NOT DETECTED Final   Klebsiella aerogenes NOT DETECTED NOT DETECTED Final   Klebsiella oxytoca NOT DETECTED NOT DETECTED Final   Klebsiella pneumoniae DETECTED (A) NOT DETECTED Final    Comment: CRITICAL RESULT CALLED TO, READ BACK BY AND VERIFIED WITH: AMY THOMPSON RN AT 1534 ON 06/17/20 SNG    Proteus species NOT DETECTED NOT  DETECTED Final   Salmonella species NOT DETECTED NOT DETECTED Final   Serratia marcescens NOT DETECTED NOT DETECTED Final   Haemophilus influenzae NOT DETECTED NOT DETECTED Final   Neisseria meningitidis NOT DETECTED NOT DETECTED Final   Pseudomonas aeruginosa NOT DETECTED NOT DETECTED Final   Stenotrophomonas maltophilia NOT DETECTED NOT DETECTED Final   Candida albicans NOT DETECTED NOT DETECTED Final   Candida auris NOT DETECTED NOT DETECTED Final   Candida glabrata NOT DETECTED NOT DETECTED Final   Candida krusei NOT DETECTED NOT DETECTED Final   Candida parapsilosis NOT DETECTED NOT DETECTED Final   Candida tropicalis NOT DETECTED NOT DETECTED Final   Cryptococcus neoformans/gattii NOT DETECTED NOT DETECTED Final   CTX-M ESBL NOT DETECTED NOT DETECTED Final   Carbapenem resistance IMP NOT DETECTED NOT DETECTED Final   Carbapenem resistance KPC NOT DETECTED NOT DETECTED Final   Carbapenem resistance NDM NOT DETECTED NOT DETECTED Final   Carbapenem resist OXA 48 LIKE NOT DETECTED NOT DETECTED Final   Carbapenem resistance VIM NOT DETECTED NOT DETECTED Final    Comment: Performed at [facility], [address], [place], [place] [address]  Urine Culture     Status: Abnormal   Collection Time: 06/17/20 11:01 AM   Specimen: Urine, Random   Result Value Ref Range Status   Specimen Description   Final    URINE, RANDOM Performed at [facility], [address] [place], [place] [address]    Special Requests   Final    NONE Performed at [facility], [address] [place], [place] [address]    Culture >=100,000 COLONIES/mL KLEBSIELLA PNEUMONIAE (A)  Final   Report Status 06/19/2020 FINAL  Final   Organism ID, Bacteria KLEBSIELLA PNEUMONIAE (A)  Final      Susceptibility   Klebsiella pneumoniae - MIC*    AMPICILLIN RESISTANT Resistant     CEFAZOLIN <=4 SENSITIVE Sensitive     CEFTRIAXONE <=0.25 SENSITIVE Sensitive     CIPROFLOXACIN <=0.25 SENSITIVE Sensitive     GENTAMICIN <=1 SENSITIVE Sensitive     IMIPENEM <=0.25 SENSITIVE Sensitive     NITROFURANTOIN 32 SENSITIVE Sensitive     TRIMETH/SULFA <=20 SENSITIVE Sensitive     AMPICILLIN/SULBACTAM 4 SENSITIVE Sensitive     PIP/TAZO <=4 SENSITIVE Sensitive     * >=100,000 COLONIES/mL KLEBSIELLA PNEUMONIAE  Culture, blood (Routine X 2) w Reflex to ID Panel     Status: None (Preliminary result)   Collection Time: 06/21/20 11:07 AM   Specimen: BLOOD  Result Value Ref Range Status   Specimen Description BLOOD RIGHT [facility]  Final   Special Requests   Final    BOTTLES DRAWN AEROBIC AND ANAEROBIC Blood Culture adequate volume   Culture   Final    NO GROWTH 4 DAYS Performed at [facility], [address] [place], [place] [address]    Report Status PENDING  Incomplete  Culture, blood (Routine X 2) w Reflex to ID Panel     Status: None (Preliminary result)   Collection Time: 06/21/20 11:09 AM   Specimen: BLOOD  Result Value Ref Range Status   Specimen Description BLOOD [place]  Final   Special Requests   Final    BOTTLES DRAWN AEROBIC AND ANAEROBIC Blood Culture adequate volume   Culture   Final    NO GROWTH 4 DAYS Performed at [facility], [address], [place], [place] [address]    Report Status PENDING  Incomplete       Lab  Results  Component Value Date   CALCIUM 6.5 (L) 06/25/2020   PHOS 2.9 06/18/2020               Impression:   Stephanie [name] is a 79 y.o.  female admitted to [facility] for COVID pneumonia. Patient found to have klebsiella sepsis and had right sided pyelonephritis. She underwent right ureteral stent placed on on 9/18.  Patient had IV contrast exposure on 9/17.   1)Renal  AKI secondary to ATN Patient has AKI secondary to multiple factors Patient has AKI secondary to sepsis/obstructive uropathy/did have IV contrast exposure/hypotension Patient AKI is worsening  I discussed with the patient about worsening of her GFR I discussed with the RN/pharmacy about need for IV bicarb alternating with IV normal saline   2)HTN Patient blood pressure stable for the acute state  3)Anemia of renal failure  HGb at goal (9--11) No need for Epogen for now  4) hyponatremia Hyponatremia secondary to inability to get rid of free water  5) obstructive uropathy with pyelonephritis Patient is status post right ureteral stent placement by Dr. Jeffie Pollock on September 18   6) Acid base Co2 is not at goal Patient has both nongap and non-anion gap acidosis Patient pH yesterday was 7.26  Patient pH little better than yesterday    Plan:  No need for dialysis today We will continue IV fluids Patient is currently nonoliguric We will follow closely about need/risk/benefit of renal placement therapy    Stephanie [name] s Chieko Neises 06/25/2020, 10:56 AM

## 2020-06-25 NOTE — Progress Notes (Signed)
[facility] NAME: Stephanie [name]    MR#:  [id number]  DATE OF BIRTH:  1941/03/04  SUBJECTIVE:  patient has 600 mL NG output yesterday and 350 today. No fever. No respiratory distress. Complains of weakness. No family at bedside REVIEW OF SYSTEMS:   Review of Systems  Constitutional: Negative for chills, fever and weight loss.  HENT: Negative for ear discharge, ear pain and nosebleeds.   Eyes: Negative for blurred vision, pain and discharge.  Respiratory: Negative for sputum production, shortness of breath, wheezing and stridor.   Cardiovascular: Negative for chest pain, palpitations, orthopnea and PND.  Gastrointestinal: Negative for abdominal pain, diarrhea, nausea and vomiting.  Genitourinary: Negative for frequency and urgency.  Musculoskeletal: Positive for joint pain. Negative for back pain.  Neurological: Positive for weakness. Negative for sensory change, speech change and focal weakness.  Psychiatric/Behavioral: Negative for depression and hallucinations. The patient is not nervous/anxious.    Tolerating Diet:NPO , NG+ Tolerating PT: will to participate since patient is on NG section  DRUG ALLERGIES:   Allergies  Allergen Reactions  . Ibuprofen Itching  . Indomethacin   . Infliximab   . Methotrexate Derivatives   . Moexipril   . Percocet [Oxycodone-Acetaminophen] Itching  . Naprosyn [Naproxen] Rash    VITALS:  Blood pressure (!) 151/68, pulse 72, temperature (!) 97.5 F (36.4 C), temperature source Oral, resp. rate (!) 22, height 5\' 6"  (1.676 m), weight 86.8 kg, SpO2 98 %.  PHYSICAL EXAMINATION:   Physical Exam  GENERAL:  79 y.o.-year-old patient lying in the bed with no acute distress.  Chronically ill HEENT: Head atraumatic, normocephalic. Oropharynx and nasopharynx clear. NG + LUNGS: Normal breath sounds bilaterally, no wheezing, rales, rhonchi. No use of accessory muscles of respiration.  CARDIOVASCULAR:  S1, S2 normal. No murmurs, rubs, or gallops.  ABDOMEN: Soft, nontender,  +distended. Few Bowel sounds present. No organomegaly or mass.  EXTREMITIES: No cyanosis, clubbing or edema b/l.    NEUROLOGIC: grossly nonfocal. A week and deconditioning PSYCHIATRIC:  patient is alert SKIN: No obvious rash, lesion, or ulcer.  Pressure Injury 06/18/20 Buttocks Left;Lower Stage 1 -  Intact skin with non-blanchable redness of a localized area usually over a bony prominence. (Active)  06/18/20 0600  Location: Buttocks  Location Orientation: Left;Lower  Staging: Stage 1 -  Intact skin with non-blanchable redness of a localized area usually over a bony prominence.  Wound Description (Comments):   Present on Admission: Yes       LABORATORY PANEL:  CBC Recent Labs  Lab 06/25/20 0731  WBC 11.3*  HGB 9.4*  HCT 25.4*  PLT 184    Chemistries  Recent Labs  Lab 06/19/20 0537 06/20/20 0841 06/25/20 0731  NA 134*   < > 124*  K 3.6   < > 4.0  CL 101   < > 95*  CO2 21*   < > 14*  GLUCOSE 140*   < > 179*  BUN 30*   < > 94*  CREATININE 2.28*   < > 5.96*  CALCIUM 7.8*   < > 6.5*  MG  --    < > 1.9  AST 30  --   --   ALT 15  --   --   ALKPHOS 110  --   --   BILITOT 0.5  --   --    < > = values in this interval not displayed.   Cardiac Enzymes No results for input(s):  TROPONINI in the last 168 hours. RADIOLOGY:  CT ABDOMEN PELVIS WO CONTRAST  Addendum Date: 06/24/2020   ADDENDUM REPORT: 06/24/2020 14:16 ADDENDUM: Study discussed by telephone with Dr. Dahlia Byes on 06/24/2020 at 14:13. He advises that the patient is not an ideal surgical candidate at this time, and I agree that a trial of conservative treatment for the presumed adhesion related small-bowel obstruction seems reasonable. I reiterated that this does not appear to be a closed loop obstruction, and that no portal venous gas or other complicating features are identified. Electronically Signed   By: Genevie Ann M.D.   On: 06/24/2020 14:16    Result Date: 06/24/2020 CLINICAL DATA:  79 year old female with abnormal urinary tracks on recent noncontrast CT. Abnormal bowel-gas pattern on KUB yesterday suspicious for small bowel obstruction or ileus. EXAM: CT ABDOMEN AND PELVIS WITHOUT CONTRAST TECHNIQUE: Multidetector CT imaging of the abdomen and pelvis was performed following the standard protocol without IV contrast. COMPARISON:  Noncontrast CT Abdomen and Pelvis 06/17/2020. KUB yesterday. FINDINGS: Lower chest: Small layering left pleural effusion is new from the prior CT, along with trace right pleural fluid. Mild lower lobe atelectasis on the left. No pericardial effusion. Calcified aortic atherosclerosis. Hepatobiliary: Small volume simple appearing perihepatic free fluid is new. The gallbladder is moderately distended with evidence of dependent sludge, but no convincing pericholecystic inflammation. Negative noncontrast liver. Pancreas: Negative. Spleen: Negative. Adrenals/Urinary Tract: Normal adrenal glands. Stable left kidney and ureter. New right double-J ureteral stent in place. Proximal and distal pigtails appear well position. Regressed right hydronephrosis and right perinephric space edema/inflammation. Decompressed urinary bladder now by Foley catheter, although there is residual bladder wall thickening. Stomach/Bowel: Rectosigmoid anastomosis in the pelvis with no adverse features. Superimposed sigmoid descending and asked him oasis also with no adverse features. The large bowel is decompressed throughout. However, oral contrast has reached the ascending colon. Air and contrast distend the stomach, duodenum, and proximal small bowel loops, with the leading edge of small bowel contrast trailing off in mid jejunum and continued gas and fluid distended small bowel loops to the level of an above an abrupt transition in the central lower abdomen seen on series 2, image 69. Distal to that small bowel is decompressed and not opacified with  contrast to the terminal ileum. There is a small volume of interloop fluid in the small bowel mesentery. No free air. Only mild small bowel wall thickening is identified. Vascular/Lymphatic: Extensive Aortoiliac calcified atherosclerosis. Vascular patency is not evaluated in the absence of IV contrast. No lymphadenopathy identified. Reproductive: Diminutive or absent uterus and ovaries as before. Other: Small volume of free fluid in the pelvis is new with simple fluid density (series 2, image 79). Bilateral flank and lower abdominal wall subcutaneous edema. Musculoskeletal: Extensive previous posterior thoracolumbar spinal fusion which extends to the iliac wings appears stable. No acute osseous abnormality identified. IMPRESSION: 1. High-grade small-bowel obstruction with an abrupt transition point in the central lower abdomen on series 2, image 69. Favor adhesions. 2. Small volume of free fluid in the abdomen and pelvis, likely reactive and/or related to anasarca - with new left greater than right pleural effusions and bilateral body wall edema. 3. New right double-J ureteral stent with improved appearance of the right kidney. Urinary bladder decompressed by Foley catheter with residual bladder wall thickening. 4. Aortic Atherosclerosis (ICD10-I70.0). Electronically Signed: By: Genevie Ann M.D. On: 06/24/2020 13:36   DG Abd 1 View  Result Date: 06/25/2020 CLINICAL DATA:  Bowel obstruction. EXAM: ABDOMEN - 1  VIEW COMPARISON:  06/24/2020 FINDINGS: An NG tube is again identified with tip overlying the peripyloric region. Decreased bowel distension noted. No new or increasing dilated bowel loops are present. RIGHT ureteral stent and spinal surgical hardware again noted. IMPRESSION: Decreased bowel distension. Electronically Signed   By: Margarette Canada M.D.   On: 06/25/2020 07:42   DG Abd 1 View  Result Date: 06/24/2020 CLINICAL DATA:  NG tube placement EXAM: ABDOMEN - 1 VIEW COMPARISON:  06/23/2020 FINDINGS: Interval  placement of nasogastric tube which courses below the diaphragm. Distal tip terminates within the right hemiabdomen, likely within the distal stomach. Dilated loops of small bowel are again seen within the visualized abdomen, similar in caliber to the previous study. Extensive thoracolumbar fusion hardware. IMPRESSION: 1. Interval placement of nasogastric tube with distal tip terminating within the distal stomach. 2. Persistent small bowel obstruction. Electronically Signed   By: Davina Poke D.O.   On: 06/24/2020 14:33   ASSESSMENT AND PLAN:  LAIKYNN POLLIO a 79 y.o.femalewith medical history significant forCOVID-19 viral infection(positive PCR test June 09, 2020),history of diabetes mellitus, history of rheumatoid arthritis on chronic immunosuppressive therapy, hypertension and a remote history of colon cancer who was brought into the ER by EMS for evaluation of fever. Patient had a T-max of 104Fat home.She complains of generalized weakness and worsening shortness of breath but denies having any nausea, no vomiting, no chest pain or abdominal pain. Patient does self caths at home. Patient is fully vaccinated She was started on prednisone by her primary care provider for presumed COVID-19 bronchitis   # SBO --Increasing abdominal distention and mild tenderness.  No N/V.  KUB showed SBO. --CT a/p with oral contrast showed "High-grade small-bowel obstruction with an abrupt transition point in the central lower abdomen. Favor Adhesions." --GenSurg consult  With Dr Dahlia Byes appreciated, rec NG tube for decompression  # Sepsis 2/2 Klebsiella bacteremia and  # right pyelonephritis  --As evidenced by fever with a T-max of103F,tachycardia, tachypnea, leukocytosis of 20,000 with a left shift and lactic acid of 3.1.  UA positive for infection.  Pt started on ceftriaxone, then broadened to zosyn on 9/19.  De-escalated to IV cipro on 9/23--change to IV Rocephin for 1 more day -repeat blood  culture from 19th September negative -Patient afebrile. White count stable  -recent CT abdomen no abdominal source of infection. --Urine grew pan-sensitive Klebsiella. --blood cx 2/2 pos for Klebsiella, repeat blood cx obtained on 9/21, neg growth so far.  # Severe right hydronephrosis --CT renal showed "Progressive right UPJ obstruction compared to prior October of 2020." --right stent placement by urology 9/18 (found frank pus in the kidney) --US renal 9/20 showed resolved hydro --continue Foley per urology --strict I/O  # New onset Afib w RVR, persistent # Acquired thrombophilia  --developed while in the ED.  HR 120's-140's despite multiple doses of IV dilt ---started heparin gtt 9/18, transitioned to Eliquis  -9/25--off IV diltiazem--HR 73's -[facility] -cardiology consulted -At home on regimen of lopressor 200 mg BID and Cardizem 120 mg daily --switch to heparin gtt for anticoagulation due to SBO -- will switch to IV metoprolol if needed.  # Hematuria --noted on 9/23.  Hgb stable.  Pt needs to be on anticoagulation due to Afib. --Monitor Hgb  # COVID-19 infection --Pt is fully vaccinated.  Patient'sCOVID-19 PCR test was positive on 06/09/20.  No overt respiratory symptoms.  CT chest clear.  No real O2 requirement on presentation.  Currently on room air. --Remdesivir completed this admisison --  according to infection control, due to pt's immunosuppressed status PTA, pt needs the 21 day isolation.  AKI, worsening Metabolic acidosis, uncmopensated --on admission creatinine is 1.5.  Baseline Cr 0.8.  Acute renal failure secondary to ATN with sepsis, obstructive uropathy, IV contrast nephropathy and prolonged prerenal azotemia. --US renal showed resolved hydro and medical renal disease --nephrology consulted --bicarb low at 12 today.  ABG showed acidosis with pH 7.26, and pCO2 <19 --start sodium bicarb D5 infusion @100  ml/hr, per nephrology  Lactic acidosis 2/2  sepsis --resolved with IVF.  Hx Diabetes mellitus  hyperglycemia related to systemic steroid administration Patient was started on prednisone as an outpatient by her primary care provider for presumed COVID-19 bronchitis Chest x-ray is negative for pneumonia and patient is not hypoxic Prednisone d/c'ed. PLAN: --SSI TID  Hx of HTN --BP drops sometimes from a lot of rate control agents --BP wnl now --Hold home HCTZ --continue cardizem as gtt due to SBO  History of rheumatoid arthritis --continue to hold [place] due to sepsis  Hypokalemia and hypomag, not POA --monitor and replete PRN  HLD --continue home statin  Hyponatremia 2/2 renal failure. --Hold HCTZ  Hypocalcemia Calcium 6.4 today, corrected Calcium calculated at 8.2 --d/c NS --obtain ionized Ca  Anemia with renal failure: Hgb stable in 10's --Monitor   Hypoalbuminemia  --albumin 1.7 --start IV albumin, per nephrology    DVT prophylaxis: [id number] gtt Code Status: DNR  Family Communication: husband updated on the phone yday by dr Billie Ruddy 06/24/20  Status is: inpatient Dispo:   The patient is from: home Anticipated d/c is to: home Anticipated d/c date is: >3 days Patient currently is not medically stable to d/c due to: worsening AKI, new SBO, on NG tube for decompression.          TOTAL TIME TAKING CARE OF THIS PATIENT: *25* minutes.  >50% time spent on counselling and coordination of care  Note: This dictation was prepared with Dragon dictation along with smaller phrase technology. Any transcriptional errors that result from this process are unintentional.  Fritzi Mandes M.D    [facility]   CC: Primary care physician; Rusty Aus, MDPatient ID: [name], female   DOB: 1940-11-15, 79 y.o.   MRN: [id number]

## 2020-06-25 NOTE — Progress Notes (Signed)
Progress Note  Patient Name: Stephanie [name] Date of Encounter: 06/25/2020  [facility] Cardiologist: [facility]  Subjective   Exam deferred  Inpatient Medications    Scheduled Meds: . acidophilus  1 capsule Oral Daily  . vitamin C  500 mg Oral Daily  . brimonidine  1 drop Both Eyes BID  . Chlorhexidine Gluconate Cloth  6 each Topical Q0600  . insulin aspart  0-20 Units Subcutaneous TID AC & HS  . mouth rinse  15 mL Mouth Rinse BID  . pantoprazole  40 mg Oral Daily  . Ensure Max Protein  11 oz Oral TID  . sodium chloride flush  10-40 mL Intracatheter Q12H  . sodium chloride flush  3 mL Intravenous Q12H  . tamsulosin  0.4 mg Oral Daily  . vitamin B-12  1,000 mcg Oral Daily  . zinc sulfate  220 mg Oral Daily   Continuous Infusions: . sodium chloride Stopped (06/22/20 1639)  . albumin human 12.5 g (06/25/20 0940)  . [START ON 06/26/2020] cefTRIAXone (ROCEPHIN)  IV    . ciprofloxacin 400 mg (06/25/20 0943)  . heparin 600 Units/hr (06/25/20 0950)  . sodium bicarbonate (isotonic) 150 mEq in D5W 1000 mL infusion 100 mL/hr at 06/25/20 0351   PRN Meds: sodium chloride, acetaminophen, albuterol, guaiFENesin-dextromethorphan, ondansetron (ZOFRAN) IV, sodium chloride flush   Vital Signs    Vitals:   06/24/20 2108 06/25/20 0026 06/25/20 0453 06/25/20 0826  BP: 124/66 (!) 151/62 (!) 129/59 (!) 151/67  Pulse: 67 69 67 70  Resp: 18 19 19 20   Temp:  97.6 F (36.4 C) (!) 97.5 F (36.4 C) 97.7 F (36.5 C)  TempSrc:    Oral  SpO2: 98% 98% 95% 98%  Weight:      Height:        Intake/Output Summary (Last 24 hours) at 06/25/2020 1226 Last data filed at 06/25/2020 0944 Gross per 24 hour  Intake 1059.37 ml  Output 1500 ml  Net -440.63 ml   Last 3 Weights 06/22/2020 06/21/2020 06/19/2020  Weight (lbs) 191 lb 6.4 oz 181 lb 3.2 oz 177 lb 4 oz  Weight (kg) 86.818 kg 82.192 kg 80.4 kg      Telemetry   SR  Per central telmetry  - Personally Reviewed  ECG    No new  - Personally  Reviewed  Physical Exam   Exam deferred as pt in COVID unit, minimize PPE/exposure  Labs    High Sensitivity Troponin:   Recent Labs  Lab 06/17/20 0357 06/17/20 1101  TROPONINIHS 61* 71*      Chemistry Recent Labs  Lab 06/19/20 0537 06/20/20 0841 06/23/20 0534 06/23/20 0649 06/24/20 0610 06/25/20 0731  NA 134*   < > 126*  --  124* 124*  K 3.6   < > 4.6  --  4.4 4.0  CL 101   < > 100  --  98 95*  CO2 21*   < > 12*  --  12* 14*  GLUCOSE 140*   < > 132*  --  129* 179*  BUN 30*   < > 65*  --  87* 94*  CREATININE 2.28*   < > 4.77*  --  5.39* 5.96*  CALCIUM 7.8*   < > 6.4*  --  6.4* 6.5*  PROT 5.9*  --   --   --   --   --   ALBUMIN 2.3*  --   --  1.7*  --   --   AST 30  --   --   --   --   --  ALT 15  --   --   --   --   --   ALKPHOS 110  --   --   --   --   --   BILITOT 0.5  --   --   --   --   --   GFRNONAA 20*   < > 8*  --  7* 6*  GFRAA 23*   < > 10*  --  8* 7*  ANIONGAP 12   < > 14  --  14 15   < > = values in this interval not displayed.     Hematology Recent Labs  Lab 06/23/20 0534 06/24/20 0610 06/25/20 0731  WBC 13.3* 13.1* 11.3*  RBC 3.66* 3.53* 3.42*  HGB 9.9* 9.9* 9.4*  HCT 28.4* 27.0* 25.4*  MCV 77.6* 76.5* 74.3*  MCH 27.0 28.0 27.5  MCHC 34.9 36.7* 37.0*  RDW 16.6* 16.3* 15.9*  PLT 153 175 184    BNP No results for input(s): BNP, PROBNP in the last 168 hours.   DDimer No results for input(s): DDIMER in the last 168 hours.   Radiology    CT ABDOMEN PELVIS WO CONTRAST  Addendum Date: 06/24/2020   ADDENDUM REPORT: 06/24/2020 14:16 ADDENDUM: Study discussed by telephone with Dr. Dahlia Byes on 06/24/2020 at 14:13. He advises that the patient is not an ideal surgical candidate at this time, and I agree that a trial of conservative treatment for the presumed adhesion related small-bowel obstruction seems reasonable. I reiterated that this does not appear to be a closed loop obstruction, and that no portal venous gas or other complicating features are  identified. Electronically Signed   By: Genevie Ann M.D.   On: 06/24/2020 14:16   Result Date: 06/24/2020 CLINICAL DATA:  79 year old female with abnormal urinary tracks on recent noncontrast CT. Abnormal bowel-gas pattern on KUB yesterday suspicious for small bowel obstruction or ileus. EXAM: CT ABDOMEN AND PELVIS WITHOUT CONTRAST TECHNIQUE: Multidetector CT imaging of the abdomen and pelvis was performed following the standard protocol without IV contrast. COMPARISON:  Noncontrast CT Abdomen and Pelvis 06/17/2020. KUB yesterday. FINDINGS: Lower chest: Small layering left pleural effusion is new from the prior CT, along with trace right pleural fluid. Mild lower lobe atelectasis on the left. No pericardial effusion. Calcified aortic atherosclerosis. Hepatobiliary: Small volume simple appearing perihepatic free fluid is new. The gallbladder is moderately distended with evidence of dependent sludge, but no convincing pericholecystic inflammation. Negative noncontrast liver. Pancreas: Negative. Spleen: Negative. Adrenals/Urinary Tract: Normal adrenal glands. Stable left kidney and ureter. New right double-J ureteral stent in place. Proximal and distal pigtails appear well position. Regressed right hydronephrosis and right perinephric space edema/inflammation. Decompressed urinary bladder now by Foley catheter, although there is residual bladder wall thickening. Stomach/Bowel: Rectosigmoid anastomosis in the pelvis with no adverse features. Superimposed sigmoid descending and asked him oasis also with no adverse features. The large bowel is decompressed throughout. However, oral contrast has reached the ascending colon. Air and contrast distend the stomach, duodenum, and proximal small bowel loops, with the leading edge of small bowel contrast trailing off in mid jejunum and continued gas and fluid distended small bowel loops to the level of an above an abrupt transition in the central lower abdomen seen on series 2,  image 69. Distal to that small bowel is decompressed and not opacified with contrast to the terminal ileum. There is a small volume of interloop fluid in the small bowel mesentery. No free air. Only mild small bowel  wall thickening is identified. Vascular/Lymphatic: Extensive Aortoiliac calcified atherosclerosis. Vascular patency is not evaluated in the absence of IV contrast. No lymphadenopathy identified. Reproductive: Diminutive or absent uterus and ovaries as before. Other: Small volume of free fluid in the pelvis is new with simple fluid density (series 2, image 79). Bilateral flank and lower abdominal wall subcutaneous edema. Musculoskeletal: Extensive previous posterior thoracolumbar spinal fusion which extends to the iliac wings appears stable. No acute osseous abnormality identified. IMPRESSION: 1. High-grade small-bowel obstruction with an abrupt transition point in the central lower abdomen on series 2, image 69. Favor adhesions. 2. Small volume of free fluid in the abdomen and pelvis, likely reactive and/or related to anasarca - with new left greater than right pleural effusions and bilateral body wall edema. 3. New right double-J ureteral stent with improved appearance of the right kidney. Urinary bladder decompressed by Foley catheter with residual bladder wall thickening. 4. Aortic Atherosclerosis (ICD10-I70.0). Electronically Signed: By: Genevie Ann M.D. On: 06/24/2020 13:36   DG Abd 1 View  Result Date: 06/25/2020 CLINICAL DATA:  Bowel obstruction. EXAM: ABDOMEN - 1 VIEW COMPARISON:  06/24/2020 FINDINGS: An NG tube is again identified with tip overlying the peripyloric region. Decreased bowel distension noted. No new or increasing dilated bowel loops are present. RIGHT ureteral stent and spinal surgical hardware again noted. IMPRESSION: Decreased bowel distension. Electronically Signed   By: Margarette Canada M.D.   On: 06/25/2020 07:42   DG Abd 1 View  Result Date: 06/24/2020 CLINICAL DATA:  NG tube  placement EXAM: ABDOMEN - 1 VIEW COMPARISON:  06/23/2020 FINDINGS: Interval placement of nasogastric tube which courses below the diaphragm. Distal tip terminates within the right hemiabdomen, likely within the distal stomach. Dilated loops of small bowel are again seen within the visualized abdomen, similar in caliber to the previous study. Extensive thoracolumbar fusion hardware. IMPRESSION: 1. Interval placement of nasogastric tube with distal tip terminating within the distal stomach. 2. Persistent small bowel obstruction. Electronically Signed   By: Davina Poke D.O.   On: 06/24/2020 14:33   DG Abd 1 View  Result Date: 06/23/2020 CLINICAL DATA:  Pyelonephritis, stent placement EXAM: ABDOMEN - 1 VIEW COMPARISON:  06/17/2020 FINDINGS: Three supine frontal views of the abdomen and pelvis are obtained. Right ureteral stent identified proximal aspect overlying right renal silhouette and distal aspect overlying the lower pelvis. There is diffuse gaseous distension of the bowel, which may reflect ileus. Maximal diameter of small bowel measures 5.4 cm. There are no masses or abnormal calcifications. Extensive postsurgical changes from thoracolumbar fusion. IMPRESSION: 1. Right ureteral stent as above. 2. Gaseous distention of bowel consistent with small-bowel obstruction or ileus. Electronically Signed   By: Randa Ngo M.D.   On: 06/23/2020 15:21    Cardiac Studies     Patient Profile     79 y.o. female with history of hypertension, diabetes, hyperlipidemia, rheumatoid arthritis, chronic urinary retention requiring self-catheterization presenting with fevers chills, diagnosed with sepsis likely from urinary origin, underwent right renal stent placement due to obstruction.  Patient being seen for new onset atrial fibrillation with rapid ventricular response.  Assessment & Plan    1  Atrial fibrillation  Pateint remains in SR   Pt is on IV heparin since not taking PO   Continue for now   She is  not on any agents for rate control  Follow   If reverts to aifib may need  2  GI    Ileus/small bowel obstruction ? Absorption of meds  ON IV heparin   If needs rate control, usie IV dilt or metoprolol  3  ANemia  Hgb is 9.4 today   4  Hx COVID pneumnia   Dx 9.9.21  On COVID unit      For questions or updates, please contact [place] Please consult [email or web address] for contact info under        Signed, Dorris Carnes, MD  06/25/2020, 12:26 PM

## 2020-06-25 NOTE — Progress Notes (Addendum)
Pt with CBG of 151 current SSI coverage resistance scale. Pt NPO d/t SBO and NG tube is in place. NP,B. [facility] consulted, new orders placed.

## 2020-06-25 NOTE — Progress Notes (Addendum)
Cont. hep drip ordered with sodium bicarb cont. and q 8 hour albumin. IV consulted placed for third access point. IV now in place. [place] cloudy urine with blood clots. NP, Rachael Fee, made aware. MD is aware. Instructed to continue with start of hep drip and monitor for change of urine color. Hep drip running. No signs of current bleed. Continuing to monitor. Ordered Cardizem held d/t NS rhythm. See NP, Sharion Settler, note.

## 2020-06-25 NOTE — Progress Notes (Signed)
Patient resting in bed. Have repositioned patient twice in the last 4 hours. She has no complaints of pain or distress. NG tube is intact to R nare. Output is brown liquid, 400cc has come out in suction canister thus far. She is relaxed IV fluids are infusing. Bed in low position and call bell in reach. Adjusted rate to 6cc/hr due to APTT/hgb/hct levels.

## 2020-06-25 NOTE — Consult Note (Addendum)
[place] for Heparin Drip Indication:  atrial fibrillation (worsening renal function, stopping eliquis, also with ileus)  Allergies  Allergen Reactions  . Ibuprofen Itching  . Indomethacin   . Infliximab   . Methotrexate Derivatives   . Moexipril   . Percocet [Oxycodone-Acetaminophen] Itching  . Naprosyn [Naproxen] Rash    Patient Measurements: Height: 5\' 6"  (167.6 cm) Weight: 86.8 kg (191 lb 6.4 oz) IBW/kg (Calculated) : 59.3 Heparin Dosing Weight: 75.9kg  Vital Signs: Temp: 97 F (36.1 C) (09/25 1749) Temp Source: Oral (09/25 1259) BP: 149/66 (09/25 1749) Pulse Rate: 75 (09/25 1749)  Labs: Recent Labs    06/23/20 0534 06/23/20 0534 06/24/20 0610 06/24/20 1323 06/25/20 0731 06/25/20 1800  HGB 9.9*   < > 9.9*  --  9.4*  --   HCT 28.4*  --  27.0*  --  25.4*  --   PLT 153  --  175  --  184  --   APTT  --   --   --  44* 111* 67*  LABPROT  --   --   --  21.6* 22.8*  --   INR  --   --   --  2.0* 2.1*  --   HEPARINUNFRC  --   --   --  2.56* 2.04*  --   CREATININE 4.77*  --  5.39*  --  5.96*  --    < > = values in this interval not displayed.    Estimated Creatinine Clearance: 8.8 mL/min (A) (by C-G formula based on SCr of 5.96 mg/dL (H)).   Medical History: Past Medical History:  Diagnosis Date  . Arthritis   . Asthma   . Cancer ([place]) 1996   colon  . Diabetes mellitus without complication ([place])   . GERD (gastroesophageal reflux disease)   . Glaucoma   . Hyperlipidemia   . Hypertension   . Lumbar disc disease   . Small bowel obstruction ([place])   . Vitamin B 12 deficiency     Medications:  Last dose Eliquis as inpatient 9/24 0907  Assessment: 79 y.o.femalewith history of hypertension, diabetes, hyperlipidemia, rheumatoid arthritis, chronic urinary retention requiring self-catheterization presenting with fevers chills, diagnosed with sepsis likely from urinary origin, underwent right renal stent placement due to  obstruction.   Patient being seen for new onset atrial fibrillation with rapid ventricular response.  Now with possible ileus and worsening Scr, will dc Eliquis and start heparin per pharmacy consult.  9/25 0731 aPTT 111 HL 2.04 9/25 1800 aPTT 67  Goal of Therapy:  aPTT 66-102 seconds Monitor platelets by anticoagulation protocol: Yes   Plan:  --9/25 at 1800 aPTT 67, lower end of therapeutic range. Will increase drip rate slightly to 650 units/hr --Re-check aPTT/HL in 8 hours --Daily CBC per protocol  Benita Gutter 06/25/2020 7:53 PM

## 2020-06-25 NOTE — Progress Notes (Deleted)
Notified Dr. Posey Pronto via secure chat about patient staying and glucose recheck being 399. No new orders were placed.

## 2020-06-26 ENCOUNTER — Inpatient Hospital Stay: Payer: Medicare Other

## 2020-06-26 LAB — CBC
HCT: 24 % — ABNORMAL LOW (ref 36.0–46.0)
Hemoglobin: 8.9 g/dL — ABNORMAL LOW (ref 12.0–15.0)
MCH: 27.1 pg (ref 26.0–34.0)
MCHC: 37.1 g/dL — ABNORMAL HIGH (ref 30.0–36.0)
MCV: 72.9 fL — ABNORMAL LOW (ref 80.0–100.0)
Platelets: 184 10*3/uL (ref 150–400)
RBC: 3.29 MIL/uL — ABNORMAL LOW (ref 3.87–5.11)
RDW: 15.1 % (ref 11.5–15.5)
WBC: 6.7 10*3/uL (ref 4.0–10.5)
nRBC: 0 % (ref 0.0–0.2)

## 2020-06-26 LAB — BLOOD GAS, ARTERIAL
Acid-base deficit: 3.6 mmol/L — ABNORMAL HIGH (ref 0.0–2.0)
Bicarbonate: 20.4 mmol/L (ref 20.0–28.0)
FIO2: 21
O2 Saturation: 96.1 %
Patient temperature: 37
pCO2 arterial: 33 mmHg (ref 32.0–48.0)
pH, Arterial: 7.4 (ref 7.350–7.450)
pO2, Arterial: 83 mmHg (ref 83.0–108.0)

## 2020-06-26 LAB — COMPREHENSIVE METABOLIC PANEL
ALT: 14 U/L (ref 0–44)
AST: 18 U/L (ref 15–41)
Albumin: 2.7 g/dL — ABNORMAL LOW (ref 3.5–5.0)
Alkaline Phosphatase: 179 U/L — ABNORMAL HIGH (ref 38–126)
Anion gap: 17 — ABNORMAL HIGH (ref 5–15)
BUN: 98 mg/dL — ABNORMAL HIGH (ref 8–23)
CO2: 20 mmol/L — ABNORMAL LOW (ref 22–32)
Calcium: 6.4 mg/dL — CL (ref 8.9–10.3)
Chloride: 90 mmol/L — ABNORMAL LOW (ref 98–111)
Creatinine, Ser: 6.14 mg/dL — ABNORMAL HIGH (ref 0.44–1.00)
GFR calc Af Amer: 7 mL/min — ABNORMAL LOW (ref >60)
GFR calc non Af Amer: 6 mL/min — ABNORMAL LOW (ref >60)
Glucose, Bld: 186 mg/dL — ABNORMAL HIGH (ref 70–99)
Potassium: 3.4 mmol/L — ABNORMAL LOW (ref 3.5–5.1)
Sodium: 127 mmol/L — ABNORMAL LOW (ref 135–145)
Total Bilirubin: 0.9 mg/dL (ref 0.3–1.2)
Total Protein: 5 g/dL — ABNORMAL LOW (ref 6.5–8.1)

## 2020-06-26 LAB — GLUCOSE, CAPILLARY
Glucose-Capillary: 166 mg/dL — ABNORMAL HIGH (ref 70–99)
Glucose-Capillary: 169 mg/dL — ABNORMAL HIGH (ref 70–99)
Glucose-Capillary: 170 mg/dL — ABNORMAL HIGH (ref 70–99)
Glucose-Capillary: 174 mg/dL — ABNORMAL HIGH (ref 70–99)
Glucose-Capillary: 178 mg/dL — ABNORMAL HIGH (ref 70–99)
Glucose-Capillary: 180 mg/dL — ABNORMAL HIGH (ref 70–99)
Glucose-Capillary: 181 mg/dL — ABNORMAL HIGH (ref 70–99)
Glucose-Capillary: 183 mg/dL — ABNORMAL HIGH (ref 70–99)

## 2020-06-26 LAB — PROTIME-INR
INR: 1.8 — ABNORMAL HIGH (ref 0.8–1.2)
Prothrombin Time: 20.2 seconds — ABNORMAL HIGH (ref 11.4–15.2)

## 2020-06-26 LAB — APTT
aPTT: 57 seconds — ABNORMAL HIGH (ref 24–36)
aPTT: 102 seconds — ABNORMAL HIGH (ref 24–36)

## 2020-06-26 LAB — CULTURE, BLOOD (ROUTINE X 2)
Culture: NO GROWTH
Culture: NO GROWTH
Special Requests: ADEQUATE
Special Requests: ADEQUATE

## 2020-06-26 LAB — HEPARIN LEVEL (UNFRACTIONATED): Heparin Unfractionated: 1.7 IU/mL — ABNORMAL HIGH (ref 0.30–0.70)

## 2020-06-26 LAB — HEMOGLOBIN A1C
Hgb A1c MFr Bld: 8 % — ABNORMAL HIGH (ref 4.8–5.6)
Mean Plasma Glucose: 182.9 mg/dL

## 2020-06-26 MED ORDER — SODIUM CHLORIDE 0.9 % IV SOLN
250.0000 mL | INTRAVENOUS | Status: DC | PRN
  Administered 2020-07-08 – 2020-07-10 (×2): 250 mL via INTRAVENOUS

## 2020-06-26 MED ORDER — HYDRALAZINE HCL 20 MG/ML IJ SOLN
10.0000 mg | Freq: Four times a day (QID) | INTRAMUSCULAR | Status: DC
  Administered 2020-06-26 – 2020-07-03 (×23): 10 mg via INTRAVENOUS
  Filled 2020-06-26 (×6): qty 1
  Filled 2020-06-26: qty 0.5
  Filled 2020-06-26 (×3): qty 1
  Filled 2020-06-26: qty 0.5
  Filled 2020-06-26 (×13): qty 1

## 2020-06-26 MED ORDER — DIATRIZOATE MEGLUMINE & SODIUM 66-10 % PO SOLN
90.0000 mL | Freq: Once | ORAL | Status: AC
  Administered 2020-06-26: 14:00:00 90 mL via NASOGASTRIC

## 2020-06-26 MED ORDER — MORPHINE 100MG IN NS 100ML (1MG/ML) PREMIX INFUSION
0.5000 mg/h | INTRAVENOUS | Status: DC
Start: 2020-06-26 — End: 2020-06-26

## 2020-06-26 MED ORDER — MORPHINE SULFATE (PF) 2 MG/ML IV SOLN
0.5000 mg | Freq: Three times a day (TID) | INTRAVENOUS | Status: DC | PRN
  Administered 2020-06-26: 0.5 mg via INTRAVENOUS
  Filled 2020-06-26: qty 1

## 2020-06-26 MED ORDER — METOPROLOL TARTRATE 5 MG/5ML IV SOLN
5.0000 mg | Freq: Four times a day (QID) | INTRAVENOUS | Status: DC
  Administered 2020-06-26 – 2020-07-04 (×31): 5 mg via INTRAVENOUS
  Filled 2020-06-26 (×31): qty 5

## 2020-06-26 MED ORDER — CALCIUM GLUCONATE-NACL 1-0.675 GM/50ML-% IV SOLN
1.0000 g | Freq: Once | INTRAVENOUS | Status: AC
  Administered 2020-06-26: 1000 mg via INTRAVENOUS
  Filled 2020-06-26 (×2): qty 50

## 2020-06-26 NOTE — Consult Note (Signed)
[place] for Heparin Drip Indication:  atrial fibrillation (worsening renal function, stopping eliquis, also with ileus)  Allergies  Allergen Reactions  . Ibuprofen Itching  . Indomethacin   . Infliximab   . Methotrexate Derivatives   . Moexipril   . Percocet [Oxycodone-Acetaminophen] Itching  . Naprosyn [Naproxen] Rash    Patient Measurements: Height: 5\' 6"  (167.6 cm) Weight: 86.8 kg (191 lb 6.4 oz) IBW/kg (Calculated) : 59.3 Heparin Dosing Weight: 75.9kg  Vital Signs: Temp: 97.5 F (36.4 C) (09/26 1959) Temp Source: Oral (09/26 1959) BP: 161/64 (09/26 1959) Pulse Rate: 82 (09/26 1959)  Labs: Recent Labs    06/24/20 0610 06/24/20 0610 06/24/20 1323 06/25/20 0731 06/25/20 0731 06/25/20 1800 06/26/20 0510 06/26/20 2043  HGB 9.9*   < >  --  9.4*  --   --  8.9*  --   HCT 27.0*  --   --  25.4*  --   --  24.0*  --   PLT 175  --   --  184  --   --  184  --   APTT  --    < > 44* 111*   < > 67* 57* 102*  LABPROT  --   --  21.6* 22.8*  --   --  20.2*  --   INR  --   --  2.0* 2.1*  --   --  1.8*  --   HEPARINUNFRC  --   --  2.56* 2.04*  --   --  1.70*  --   CREATININE 5.39*  --   --  5.96*  --   --  6.14*  --    < > = values in this interval not displayed.    Estimated Creatinine Clearance: 8.5 mL/min (A) (by C-G formula based on SCr of 6.14 mg/dL (H)).   Medical History: Past Medical History:  Diagnosis Date  . Arthritis   . Asthma   . Cancer ([place]) 1996   colon  . Diabetes mellitus without complication ([place])   . GERD (gastroesophageal reflux disease)   . Glaucoma   . Hyperlipidemia   . Hypertension   . Lumbar disc disease   . Small bowel obstruction ([place])   . Vitamin B 12 deficiency     Medications:  Last dose Eliquis as inpatient 9/24 0907  Assessment: 79 y.o.femalewith history of hypertension, diabetes, hyperlipidemia, rheumatoid arthritis, chronic urinary retention requiring self-catheterization presenting  with fevers chills, diagnosed with sepsis likely from urinary origin, underwent right renal stent placement due to obstruction.   Patient being seen for new onset atrial fibrillation with rapid ventricular response.  Now with possible ileus and worsening Scr, will dc Eliquis and start heparin per pharmacy consult.  9/25 0731 aPTT 111 sec, HL 2.04 9/25 1800 aPTT 67 sec, increased to 650 units/hr 9/26 0510 aPTT 57 sec, increased to 750 units/hr 9/26 2043 aPTT 102 sec, therapeutic x 1  Goal of Therapy:  aPTT 66-102 seconds Monitor platelets by anticoagulation protocol: Yes   Plan:  9/26 2043 aPTT is at the upper end of therapeutic. Will continue with current rate of 750 units/hr and recheck aPTT in 8 hours. Will recheck HL at same time. Daily CBC while on Heparin drip.   Paulina Fusi, PharmD, BCPS 06/26/2020 9:21 PM

## 2020-06-26 NOTE — Progress Notes (Signed)
NG tube output 100 ml, green in color. Pt with 1 bowel movement during shift. Mucus like in characteristic.

## 2020-06-26 NOTE — Progress Notes (Signed)
CBG of 178 @2001  results failed to transfer over.

## 2020-06-26 NOTE — Progress Notes (Signed)
A consult was placed to the IV Therapist for more access;  Pt has had multiple iv sticks, and she requires multiple sites due to several incompatible medications; was able to place another line,  so pt now has 3 sites, however, 2 of those sites will probably need to be replaced soon; strongly suggest central access for this pt;  She is not a PICC or Midline candidate due to CrCl of 8.

## 2020-06-26 NOTE — Progress Notes (Addendum)
Stephanie [name]  MRN: [id number]  DOB/AGE: [birth year] 79 y.o.  Primary Care Physician:Miller, Christean Grief, MD  Admit date: 06/17/2020  Chief Complaint:  Chief Complaint  Patient presents with  . Shortness of Breath    S-Pt presented on  06/17/2020 with  Chief Complaint  Patient presents with  . Shortness of Breath   Patient offers no specific complaints. No complaint of chest pain/shortness of breath. I discussed patient kidney related issues. I discussed with the patient about rising creatinine/falling kidney function and need for renal replacement therapy. Patient asked me relevant question about "what is dialysis, how long does it take, etc".  I answered patient questions to the best of my ability. Because of issues with mask/face shield/accent I had the help of patient's RN to help me communicate in an effort to make sure that patient understands.  Patient asked me to call her husband.I did call her husband and informed him.  Patient has been smear outpatient wellbeing, kidney issues, is a patient is getting up from the bed and not and other questions I answered patient's husband questions to the best of my ability   Medications . acidophilus  1 capsule Oral Daily  . vitamin C  500 mg Oral Daily  . brimonidine  1 drop Both Eyes BID  . chlorhexidine  15 mL Mouth Rinse BID  . Chlorhexidine Gluconate Cloth  6 each Topical Q0600  . insulin aspart  0-9 Units Subcutaneous Q4H  . mouth rinse  15 mL Mouth Rinse BID  . pantoprazole  40 mg Oral Daily  . Ensure Max Protein  11 oz Oral TID  . sodium chloride flush  10-40 mL Intracatheter Q12H  . sodium chloride flush  3 mL Intravenous Q12H  . tamsulosin  0.4 mg Oral Daily  . vitamin B-12  1,000 mcg Oral Daily  . zinc sulfate  220 mg Oral Daily         QQP:[id number] from the symptoms mentioned above,there are no other symptoms referable to all systems reviewed.  Physical Exam: Vital signs in last 24 hours: Temp:  [97 F (36.1 C)-98.1  F (36.7 C)] 98.1 F (36.7 C) (09/26 0806) Pulse Rate:  [72-77] 73 (09/26 0806) Resp:  [13-24] 20 (09/26 0806) BP: (139-171)/(63-75) 165/70 (09/26 0806) SpO2:  [95 %-98 %] 96 % (09/26 0806) Weight change:  Last BM Date: 06/26/20  Intake/Output from previous day: 09/25 0701 - 09/26 0700 In: 2401.8 [I.V.:2052.2; IV Piggyback:349.6] Out: 1100 [Urine:650; Emesis/NG output:450] No intake/output data recorded.   Physical Exam: General- pt is awake,alert, oriented to time place and person HEENT-NG tube in situ Resp- No acute REsp distress,  Rhonchi present CVS- S1S2 regular in rate and rhythm GIT- BS+, soft, NT, ND EXT- NO LE Edema, Cyanosis GU catheter in situ Hematuria much better than before.   Lab Results: CBC Recent Labs    06/25/20 0731 06/26/20 0510  WBC 11.3* 6.7  HGB 9.4* 8.9*  HCT 25.4* 24.0*  PLT 184 184    BMET Recent Labs    06/25/20 0731 06/26/20 0510  NA 124* 127*  K 4.0 3.4*  CL 95* 90*  CO2 14* 20*  GLUCOSE 179* 186*  BUN 94* 98*  CREATININE 5.96* 6.14*  CALCIUM 6.5* 6.4*    Creatinine trend 2021 during this admission 1.5==>5.96 ==>6.1 0.9 as baseline  Sodium trend 2021  135==>124==>127   Acid base 127-110=17  Albumin 1.5  Delta anion gap 17-5=12  Delta bicarb 24-20=4    MICRO Recent  Results (from the past 240 hour(s))  Blood culture (routine x 2)     Status: Abnormal   Collection Time: 06/17/20  3:57 AM   Specimen: BLOOD  Result Value Ref Range Status   Specimen Description   Final    BLOOD LEFT HAND Performed at [facility], [address] [place], [place] [address]    Special Requests   Final    BOTTLES DRAWN AEROBIC AND ANAEROBIC Blood Culture adequate volume Performed at [facility], [address] [place], [place] [address]    Culture  Setup Time   Final    Organism ID to follow GRAM NEGATIVE RODS IN BOTH AEROBIC AND ANAEROBIC BOTTLES CRITICAL RESULT CALLED TO, READ BACK BY AND  VERIFIED WITH: AMY THOMPSON AT 1534 ON 06/17/20 SNG Performed at [facility], [address] [place], [place] [address]    Culture KLEBSIELLA PNEUMONIAE (A)  Final   Report Status 06/19/2020 FINAL  Final   Organism ID, Bacteria KLEBSIELLA PNEUMONIAE  Final      Susceptibility   Klebsiella pneumoniae - MIC*    AMPICILLIN RESISTANT Resistant     CEFAZOLIN <=4 SENSITIVE Sensitive     CEFEPIME <=0.12 SENSITIVE Sensitive     CEFTAZIDIME <=1 SENSITIVE Sensitive     CEFTRIAXONE <=0.25 SENSITIVE Sensitive     CIPROFLOXACIN <=0.25 SENSITIVE Sensitive     GENTAMICIN <=1 SENSITIVE Sensitive     IMIPENEM <=0.25 SENSITIVE Sensitive     TRIMETH/SULFA <=20 SENSITIVE Sensitive     AMPICILLIN/SULBACTAM 4 SENSITIVE Sensitive     PIP/TAZO <=4 SENSITIVE Sensitive     * KLEBSIELLA PNEUMONIAE  Blood culture (routine x 2)     Status: Abnormal   Collection Time: 06/17/20  3:57 AM   Specimen: BLOOD  Result Value Ref Range Status   Specimen Description   Final    BLOOD RIGHT [facility] Performed at [facility], [address] [place], [place] [address]    Special Requests   Final    BOTTLES DRAWN AEROBIC AND ANAEROBIC Blood Culture adequate volume Performed at [facility], [address] [place], [place] [address]    Culture  Setup Time   Final    GRAM NEGATIVE RODS IN BOTH AEROBIC AND ANAEROBIC BOTTLES CRITICAL VALUE NOTED.  VALUE IS CONSISTENT WITH PREVIOUSLY REPORTED AND CALLED VALUE. Performed at [facility], [address] [place], [place] [address]    Culture (A)  Final    KLEBSIELLA PNEUMONIAE SUSCEPTIBILITIES PERFORMED ON PREVIOUS CULTURE WITHIN THE LAST 5 DAYS. Performed at [facility], [place] [address] [place], [place] [address]    Report Status 06/19/2020 FINAL  Final  Blood Culture ID Panel (Reflexed)     Status: Abnormal   Collection Time: 06/17/20  3:57 AM  Result Value Ref Range Status   Enterococcus faecalis NOT DETECTED NOT DETECTED  Final   Enterococcus Faecium NOT DETECTED NOT DETECTED Final   Listeria monocytogenes NOT DETECTED NOT DETECTED Final   Staphylococcus species NOT DETECTED NOT DETECTED Final   Staphylococcus aureus (BCID) NOT DETECTED NOT DETECTED Final   Staphylococcus epidermidis NOT DETECTED NOT DETECTED Final   Staphylococcus lugdunensis NOT DETECTED NOT DETECTED Final   Streptococcus species NOT DETECTED NOT DETECTED Final   Streptococcus agalactiae NOT DETECTED NOT DETECTED Final   Streptococcus pneumoniae NOT DETECTED NOT DETECTED Final   Streptococcus pyogenes NOT DETECTED NOT DETECTED Final   A.calcoaceticus-baumannii NOT DETECTED NOT DETECTED Final   Bacteroides fragilis NOT DETECTED NOT  DETECTED Final   Enterobacterales DETECTED (A) NOT DETECTED Final    Comment: Enterobacterales represent a large order of gram negative bacteria, not a single organism. CRITICAL RESULT CALLED TO, READ BACK BY AND VERIFIED WITH: AMY THOMPSON AT 3818 ON 06/17/20 SNG    Enterobacter cloacae complex NOT DETECTED NOT DETECTED Final   Escherichia coli NOT DETECTED NOT DETECTED Final   Klebsiella aerogenes NOT DETECTED NOT DETECTED Final   Klebsiella oxytoca NOT DETECTED NOT DETECTED Final   Klebsiella pneumoniae DETECTED (A) NOT DETECTED Final    Comment: CRITICAL RESULT CALLED TO, READ BACK BY AND VERIFIED WITH: AMY THOMPSON RN AT 2993 ON 06/17/20 SNG    Proteus species NOT DETECTED NOT DETECTED Final   Salmonella species NOT DETECTED NOT DETECTED Final   Serratia marcescens NOT DETECTED NOT DETECTED Final   Haemophilus influenzae NOT DETECTED NOT DETECTED Final   Neisseria meningitidis NOT DETECTED NOT DETECTED Final   Pseudomonas aeruginosa NOT DETECTED NOT DETECTED Final   Stenotrophomonas maltophilia NOT DETECTED NOT DETECTED Final   Candida albicans NOT DETECTED NOT DETECTED Final   Candida auris NOT DETECTED NOT DETECTED Final   Candida glabrata NOT DETECTED NOT DETECTED Final   Candida krusei NOT  DETECTED NOT DETECTED Final   Candida parapsilosis NOT DETECTED NOT DETECTED Final   Candida tropicalis NOT DETECTED NOT DETECTED Final   Cryptococcus neoformans/gattii NOT DETECTED NOT DETECTED Final   CTX-M ESBL NOT DETECTED NOT DETECTED Final   Carbapenem resistance IMP NOT DETECTED NOT DETECTED Final   Carbapenem resistance KPC NOT DETECTED NOT DETECTED Final   Carbapenem resistance NDM NOT DETECTED NOT DETECTED Final   Carbapenem resist OXA 48 LIKE NOT DETECTED NOT DETECTED Final   Carbapenem resistance VIM NOT DETECTED NOT DETECTED Final    Comment: Performed at [facility], [address] [place], [place] [address]  Urine Culture     Status: Abnormal   Collection Time: 06/17/20 11:01 AM   Specimen: Urine, Random  Result Value Ref Range Status   Specimen Description   Final    URINE, RANDOM Performed at [facility], [address] [place], [place] [address]    Special Requests   Final    NONE Performed at [facility], [address] [place], [place] [address]    Culture >=100,000 COLONIES/mL KLEBSIELLA PNEUMONIAE (A)  Final   Report Status 06/19/2020 FINAL  Final   Organism ID, Bacteria KLEBSIELLA PNEUMONIAE (A)  Final      Susceptibility   Klebsiella pneumoniae - MIC*    AMPICILLIN RESISTANT Resistant     CEFAZOLIN <=4 SENSITIVE Sensitive     CEFTRIAXONE <=0.25 SENSITIVE Sensitive     CIPROFLOXACIN <=0.25 SENSITIVE Sensitive     GENTAMICIN <=1 SENSITIVE Sensitive     IMIPENEM <=0.25 SENSITIVE Sensitive     NITROFURANTOIN 32 SENSITIVE Sensitive     TRIMETH/SULFA <=20 SENSITIVE Sensitive     AMPICILLIN/SULBACTAM 4 SENSITIVE Sensitive     PIP/TAZO <=4 SENSITIVE Sensitive     * >=100,000 COLONIES/mL KLEBSIELLA PNEUMONIAE  Culture, blood (Routine X 2) w Reflex to ID Panel     Status: None   Collection Time: 06/21/20 11:07 AM   Specimen: BLOOD  Result Value Ref Range Status   Specimen Description BLOOD RIGHT AC  Final   Special Requests    Final    BOTTLES DRAWN AEROBIC AND ANAEROBIC Blood Culture adequate volume   Culture   Final    NO GROWTH 5 DAYS Performed at [facility], [address] [place], [place] [address]    Report Status 06/26/2020 FINAL  Final  Culture, blood (Routine X 2) w Reflex to ID Panel     Status: None   Collection Time: 06/21/20 11:09 AM   Specimen: BLOOD  Result Value Ref Range Status   Specimen Description BLOOD [place]  Final   Special Requests   Final    BOTTLES DRAWN AEROBIC AND ANAEROBIC Blood Culture adequate volume   Culture   Final    NO GROWTH 5 DAYS Performed at [facility], [address] [place], [place] [address]    Report Status 06/26/2020 FINAL  Final      Lab Results  Component Value Date   CALCIUM 6.4 (LL) 06/26/2020   PHOS 2.9 06/18/2020               Impression:   Ms. Stephanie [name] is a 79 y.o.  female admitted to [facility] for COVID pneumonia. Patient found to have klebsiella sepsis and had right sided pyelonephritis. She underwent right ureteral stent placed on on 9/18.  Patient had IV contrast exposure on 9/17.   1)Renal  AKI secondary to ATN Patient has AKI secondary to multiple factors Patient has AKI secondary to sepsis/obstructive uropathy/did have IV contrast exposure/hypotension Patient AKI is worsening  I discussed with the patient about worsening of her GFR I discussed with the RN/pharmacy about need for IV bicarb alternating with IV normal saline   2)HTN Patient blood pressure stable for the acute state  3)Anemia of renal failure  HGb at goal (9--11) No need for Epogen for now  4) hyponatremia Hyponatremia secondary to inability to get rid of free water  5) obstructive uropathy with pyelonephritis Patient is status post right ureteral stent placement by Dr. Jeffie Pollock on September 18   6) Acid base Co2 is not at goal but better Patient has both nonanopngap and high anion gap acidosis     Plan:  In an  effort to avoid fluid overload Will DC IV albumin Will DC IV fluids  To help decide if patient needs bicarb Will check ABG If bicarb is better we will DC sodium bicarb-IV  Decision making regarding dialysis Patient has no acute need for renal replacement therapy today but with patient's progressive worsening of GFR patient will most likely need renal replacement therapy soon I did discuss with the patient and her husband that patient will most likely benefit/need dialysis tomorrow. I answered patient and her husband questions to the best of my ability Patient and her husband voiced understanding.    Nadea Kirkland s Theador Hawthorne 06/26/2020, 10:58 AM

## 2020-06-26 NOTE — Progress Notes (Signed)
    Pt remains in SR   Reviewed with Dr. Posey Pronto   NG tube clamped   Will be taking PO  Soon  Start Eliquis 5 mg bid when taking PO REview of BP I would add lopressor 5 mg IV q 6 hours now   Resume PO form when taking PO well     Signed, Dorris Carnes, MD  06/26/2020, 1:46 PM

## 2020-06-26 NOTE — Progress Notes (Signed)
CC: SBO Subjective: Patient without any major changes but without any improvement either.  Upon he had a mucus bowel movement yesterday.  No flatus.  450 cc from the NG tube that is bilious.  KUB personally reviewed and there is persistent air-fluid levels.  I have also personally reviewed the CT scan again.  She does have contrast in the colon so is definitely not a complete bowel obstruction. Care INR is better.  She has issues with IV access. Acidosis improving but creatinine worsening.  Nephrology she will require dialysis.  She is definitely malnourished and debilitated Denies any abdominal pain.  Case discussed in detail with Dr. Posey Pronto and Dr. Theador Hawthorne  Objective: Vital signs in last 24 hours: Temp:  [97 F (36.1 C)-98.1 F (36.7 C)] 98.1 F (36.7 C) (09/26 0806) Pulse Rate:  [72-77] 73 (09/26 0806) Resp:  [13-24] 20 (09/26 0806) BP: (139-171)/(63-75) 165/70 (09/26 0806) SpO2:  [95 %-98 %] 96 % (09/26 0806) Last BM Date: 06/26/20  Intake/Output from previous day: 09/25 0701 - 09/26 0700 In: 2401.8 [I.V.:2052.2; IV Piggyback:349.6] Out: 1100 [Urine:650; Emesis/NG output:450] Intake/Output this shift: No intake/output data recorded.  Physical exam: Debilitated. Non toxic Abd: decrease BS, distended, no tenderness to palpation , no peritonitis Ext: well perfused and warm   Lab Results: CBC  Recent Labs    06/25/20 0731 06/26/20 0510  WBC 11.3* 6.7  HGB 9.4* 8.9*  HCT 25.4* 24.0*  PLT 184 184   BMET Recent Labs    06/25/20 0731 06/26/20 0510  NA 124* 127*  K 4.0 3.4*  CL 95* 90*  CO2 14* 20*  GLUCOSE 179* 186*  BUN 94* 98*  CREATININE 5.96* 6.14*  CALCIUM 6.5* 6.4*   PT/INR Recent Labs    06/25/20 0731 06/26/20 0510  LABPROT 22.8* 20.2*  INR 2.1* 1.8*   ABG Recent Labs    06/25/20 1130 06/26/20 1130  PHART 7.33* 7.40  HCO3 14.2* 20.4    Studies/Results: DG Abd 1 View  Result Date: 06/25/2020 CLINICAL DATA:  Bowel obstruction. EXAM: ABDOMEN  - 1 VIEW COMPARISON:  06/24/2020 FINDINGS: An NG tube is again identified with tip overlying the peripyloric region. Decreased bowel distension noted. No new or increasing dilated bowel loops are present. RIGHT ureteral stent and spinal surgical hardware again noted. IMPRESSION: Decreased bowel distension. Electronically Signed   By: Margarette Canada M.D.   On: 06/25/2020 07:42   DG Abd 1 View  Result Date: 06/24/2020 CLINICAL DATA:  NG tube placement EXAM: ABDOMEN - 1 VIEW COMPARISON:  06/23/2020 FINDINGS: Interval placement of nasogastric tube which courses below the diaphragm. Distal tip terminates within the right hemiabdomen, likely within the distal stomach. Dilated loops of small bowel are again seen within the visualized abdomen, similar in caliber to the previous study. Extensive thoracolumbar fusion hardware. IMPRESSION: 1. Interval placement of nasogastric tube with distal tip terminating within the distal stomach. 2. Persistent small bowel obstruction. Electronically Signed   By: Davina Poke D.O.   On: 06/24/2020 14:33   DG ABD ACUTE 2+V W 1V CHEST  Result Date: 06/26/2020 CLINICAL DATA:  Small bowel obstruction. EXAM: X-RAY ABDOMEN 2 VIEW FINDINGS: The enteric tube tip still terminates in the right upper quadrant, presumably at the distal stomach. Continued small bowel distension. No visible pneumoperitoneum internal ureteral stent on the right. Extensive thoracic to pelvic spine fusion. Atelectatic opacity at the lower lobes. IMPRESSION: Ongoing small bowel obstruction without detected change from yesterday. Electronically Signed   By: Monte Fantasia  M.D.   On: 06/26/2020 10:41    Anti-infectives: Anti-infectives (From admission, onward)   Start     Dose/Rate Route Frequency Ordered Stop   06/26/20 1000  cefTRIAXone (ROCEPHIN) 2 g in sodium chloride 0.9 % 100 mL IVPB        2 g 200 mL/hr over 30 Minutes Intravenous Every 24 hours 06/25/20 1138 06/26/20 1042   06/25/20 1000   ciprofloxacin (CIPRO) IVPB 400 mg  Status:  Discontinued        400 mg 200 mL/hr over 60 Minutes Intravenous Every 24 hours 06/24/20 1234 06/26/20 0919   06/23/20 1000  ciprofloxacin (CIPRO) tablet 500 mg  Status:  Discontinued        500 mg Oral Daily 06/23/20 0927 06/24/20 1234   06/20/20 1600  piperacillin-tazobactam (ZOSYN) IVPB 3.375 g  Status:  Discontinued        3.375 g 12.5 mL/hr over 240 Minutes Intravenous Every 12 hours 06/20/20 1217 06/23/20 0926   06/19/20 2000  piperacillin-tazobactam (ZOSYN) IVPB 3.375 g  Status:  Discontinued        3.375 g 12.5 mL/hr over 240 Minutes Intravenous Every 8 hours 06/19/20 1915 06/20/20 1217   06/18/20 1800  cefTRIAXone (ROCEPHIN) 2 g in sodium chloride 0.9 % 100 mL IVPB  Status:  Discontinued        2 g 200 mL/hr over 30 Minutes Intravenous Every 24 hours 06/17/20 1545 06/17/20 1545   06/18/20 1000  remdesivir 100 mg in sodium chloride 0.9 % 100 mL IVPB       "Followed by" Linked Group Details   100 mg 200 mL/hr over 30 Minutes Intravenous Daily 06/17/20 0809 06/21/20 0954   06/18/20 0800  azithromycin (ZITHROMAX) 500 mg in sodium chloride 0.9 % 250 mL IVPB  Status:  Discontinued        500 mg 250 mL/hr over 60 Minutes Intravenous Every 24 hours 06/17/20 0805 06/17/20 0914   06/17/20 1800  cefTRIAXone (ROCEPHIN) 2 g in sodium chloride 0.9 % 100 mL IVPB  Status:  Discontinued        2 g 200 mL/hr over 30 Minutes Intravenous Every 24 hours 06/17/20 1545 06/19/20 1915   06/17/20 1500  cefTRIAXone (ROCEPHIN) 2 g in sodium chloride 0.9 % 100 mL IVPB  Status:  Discontinued        2 g 200 mL/hr over 30 Minutes Intravenous Every 24 hours 06/17/20 0805 06/17/20 1545   06/17/20 1000  remdesivir 200 mg in sodium chloride 0.9% 250 mL IVPB       "Followed by" Linked Group Details   200 mg 580 mL/hr over 30 Minutes Intravenous Once 06/17/20 0809 06/17/20 1216   06/17/20 0530  ceFEPIme (MAXIPIME) 1 g in sodium chloride 0.9 % 100 mL IVPB        1 g 200  mL/hr over 30 Minutes Intravenous  Once 06/17/20 0515 06/17/20 0810   06/17/20 0530  vancomycin (VANCOCIN) IVPB 1000 mg/200 mL premix        1,000 mg 200 mL/hr over 60 Minutes Intravenous  Once 06/17/20 0515 06/17/20 0929   06/17/20 0530  azithromycin (ZITHROMAX) tablet 500 mg        500 mg Oral  Once 06/17/20 0515 06/17/20 0723      Assessment/Plan: SBO partial persistent fluid levels.  Difficult situation because it is not clear-cut scenario.  She is in the gray area.  We do have evidence that is a partial bowel obstruction and there is contrast in the  colon and clinically she is not peritoneal leak on the other hand there is still persistent air-fluid levels to suggest persistent partial obstruction. She   Has other significant comorbidities that makes things more complicated.  I had an extensive discussion with the patient and she is to avoid any surgical intervention if at all possible.  Will obtain Gastrografin challenge today and reassess.  She may require intervention if her condition does not improve.  Currently no need for surgical intervention.  Please note that I spent more than 40 minutes in this encounter with greater than 50% spent in coordination and counseling of her care  Caroleen Hamman, MD, [facility]  06/26/2020

## 2020-06-26 NOTE — Progress Notes (Signed)
CRITICAL VALUE ALERT  Critical Value:  Ca+ 6.4  Date & Time Notied:  9/26/201 0705 Provider Notified:Patel, S., MD  Orders Received/Actions taken: no new orders at this time

## 2020-06-26 NOTE — Consult Note (Signed)
[place] for Heparin Drip Indication:  atrial fibrillation (worsening renal function, stopping eliquis, also with ileus)  Allergies  Allergen Reactions  . Ibuprofen Itching  . Indomethacin   . Infliximab   . Methotrexate Derivatives   . Moexipril   . Percocet [Oxycodone-Acetaminophen] Itching  . Naprosyn [Naproxen] Rash    Patient Measurements: Height: 5\' 6"  (167.6 cm) Weight: 86.8 kg (191 lb 6.4 oz) IBW/kg (Calculated) : 59.3 Heparin Dosing Weight: 75.9kg  Vital Signs: Temp: 97.8 F (36.6 C) (09/26 0358) Temp Source: Oral (09/26 0358) BP: 161/74 (09/26 0358) Pulse Rate: 77 (09/26 0358)  Labs: Recent Labs    06/24/20 0610 06/24/20 0610 06/24/20 1323 06/25/20 0731 06/25/20 1800 06/26/20 0510  HGB 9.9*   < >  --  9.4*  --  8.9*  HCT 27.0*  --   --  25.4*  --  24.0*  PLT 175  --   --  184  --  184  APTT  --    < > 44* 111* 67* 57*  LABPROT  --   --  21.6* 22.8*  --  20.2*  INR  --   --  2.0* 2.1*  --  1.8*  HEPARINUNFRC  --   --  2.56* 2.04*  --   --   CREATININE 5.39*  --   --  5.96*  --  6.14*   < > = values in this interval not displayed.    Estimated Creatinine Clearance: 8.5 mL/min (A) (by C-G formula based on SCr of 6.14 mg/dL (H)).   Medical History: Past Medical History:  Diagnosis Date  . Arthritis   . Asthma   . Cancer ([place]) 1996   colon  . Diabetes mellitus without complication ([place])   . GERD (gastroesophageal reflux disease)   . Glaucoma   . Hyperlipidemia   . Hypertension   . Lumbar disc disease   . Small bowel obstruction ([place])   . Vitamin B 12 deficiency     Medications:  Last dose Eliquis as inpatient 9/24 0907  Assessment: 79 y.o.femalewith history of hypertension, diabetes, hyperlipidemia, rheumatoid arthritis, chronic urinary retention requiring self-catheterization presenting with fevers chills, diagnosed with sepsis likely from urinary origin, underwent right renal stent placement due  to obstruction.   Patient being seen for new onset atrial fibrillation with rapid ventricular response.  Now with possible ileus and worsening Scr, will dc Eliquis and start heparin per pharmacy consult.  9/25 0731 aPTT 111 HL 2.04 9/25 1800 aPTT 67  Goal of Therapy:  aPTT 66-102 seconds Monitor platelets by anticoagulation protocol: Yes   Plan:  --9/25 at 1800 aPTT 67, lower end of therapeutic range. Will increase drip rate slightly to 650 units/hr --Re-check aPTT/HL in 8 hours --Daily CBC per protocol  9/26: aPTT @ 0510 = 57 Will increase heparin drip to 750 units/hr and recheck aPTT 8 hrs after rate change.  Masashi Snowdon D 06/26/2020 7:11 AM

## 2020-06-26 NOTE — Progress Notes (Signed)
[facility] NAME: Jenniger Figiel    MR#:  [id number]  DATE OF BIRTH:  May 25, 1941  SUBJECTIVE:  patient NG out put decreasing. Not passing gas No fever. No respiratory distress. Complains of weakness. Decreased UOP with elevated creat REVIEW OF SYSTEMS:   Review of Systems  Constitutional: Negative for chills, fever and weight loss.  HENT: Negative for ear discharge, ear pain and nosebleeds.   Eyes: Negative for blurred vision, pain and discharge.  Respiratory: Negative for sputum production, shortness of breath, wheezing and stridor.   Cardiovascular: Negative for chest pain, palpitations, orthopnea and PND.  Gastrointestinal: Negative for abdominal pain, diarrhea, nausea and vomiting.  Genitourinary: Negative for frequency and urgency.  Musculoskeletal: Positive for joint pain. Negative for back pain.  Neurological: Positive for weakness. Negative for sensory change, speech change and focal weakness.  Psychiatric/Behavioral: Negative for depression and hallucinations. The patient is not nervous/anxious.    Tolerating Diet:NPO , NG+ Tolerating PT: not able to participate since patient is on NG suction  DRUG ALLERGIES:   Allergies  Allergen Reactions  . Ibuprofen Itching  . Indomethacin   . Infliximab   . Methotrexate Derivatives   . Moexipril   . Percocet [Oxycodone-Acetaminophen] Itching  . Naprosyn [Naproxen] Rash    VITALS:  Blood pressure (!) 165/70, pulse 73, temperature 98.1 F (36.7 C), temperature source Oral, resp. rate 20, height 5\' 6"  (1.676 m), weight 86.8 kg, SpO2 96 %.  PHYSICAL EXAMINATION:   Physical Exam  GENERAL:  79 y.o.-year-old patient lying in the bed with no acute distress.  Chronically ill HEENT: Head atraumatic, normocephalic. Oropharynx and nasopharynx clear. NG + LUNGS: Normal breath sounds bilaterally, no wheezing, rales, rhonchi. No use of accessory muscles of respiration.  CARDIOVASCULAR:  S1, S2 normal. No murmurs, rubs, or gallops.  ABDOMEN: Soft, nontender,  +distended. Few Bowel sounds present. No organomegaly or mass.  EXTREMITIES: No cyanosis, clubbing or edema b/l.    NEUROLOGIC: grossly nonfocal. A week and deconditioning PSYCHIATRIC:  patient is alert SKIN: No obvious rash, lesion, or ulcer.  Pressure Injury 06/18/20 Buttocks Left;Lower Stage 1 -  Intact skin with non-blanchable redness of a localized area usually over a bony prominence. (Active)  06/18/20 0600  Location: Buttocks  Location Orientation: Left;Lower  Staging: Stage 1 -  Intact skin with non-blanchable redness of a localized area usually over a bony prominence.  Wound Description (Comments):   Present on Admission: Yes       LABORATORY PANEL:  CBC Recent Labs  Lab 06/26/20 0510  WBC 6.7  HGB 8.9*  HCT 24.0*  PLT 184    Chemistries  Recent Labs  Lab 06/25/20 0731 06/25/20 0731 06/26/20 0510  NA 124*   < > 127*  K 4.0   < > 3.4*  CL 95*   < > 90*  CO2 14*   < > 20*  GLUCOSE 179*   < > 186*  BUN 94*   < > 98*  CREATININE 5.96*   < > 6.14*  CALCIUM 6.5*   < > 6.4*  MG 1.9  --   --   AST  --   --  18  ALT  --   --  14  ALKPHOS  --   --  179*  BILITOT  --   --  0.9   < > = values in this interval not displayed.   Cardiac Enzymes No results for input(s): TROPONINI in the  last 168 hours. RADIOLOGY:  CT ABDOMEN PELVIS WO CONTRAST  Addendum Date: 06/24/2020   ADDENDUM REPORT: 06/24/2020 14:16 ADDENDUM: Study discussed by telephone with Dr. Dahlia Byes on 06/24/2020 at 14:13. He advises that the patient is not an ideal surgical candidate at this time, and I agree that a trial of conservative treatment for the presumed adhesion related small-bowel obstruction seems reasonable. I reiterated that this does not appear to be a closed loop obstruction, and that no portal venous gas or other complicating features are identified. Electronically Signed   By: Genevie Ann M.D.   On: 06/24/2020 14:16    Result Date: 06/24/2020 CLINICAL DATA:  79 year old female with abnormal urinary tracks on recent noncontrast CT. Abnormal bowel-gas pattern on KUB yesterday suspicious for small bowel obstruction or ileus. EXAM: CT ABDOMEN AND PELVIS WITHOUT CONTRAST TECHNIQUE: Multidetector CT imaging of the abdomen and pelvis was performed following the standard protocol without IV contrast. COMPARISON:  Noncontrast CT Abdomen and Pelvis 06/17/2020. KUB yesterday. FINDINGS: Lower chest: Small layering left pleural effusion is new from the prior CT, along with trace right pleural fluid. Mild lower lobe atelectasis on the left. No pericardial effusion. Calcified aortic atherosclerosis. Hepatobiliary: Small volume simple appearing perihepatic free fluid is new. The gallbladder is moderately distended with evidence of dependent sludge, but no convincing pericholecystic inflammation. Negative noncontrast liver. Pancreas: Negative. Spleen: Negative. Adrenals/Urinary Tract: Normal adrenal glands. Stable left kidney and ureter. New right double-J ureteral stent in place. Proximal and distal pigtails appear well position. Regressed right hydronephrosis and right perinephric space edema/inflammation. Decompressed urinary bladder now by Foley catheter, although there is residual bladder wall thickening. Stomach/Bowel: Rectosigmoid anastomosis in the pelvis with no adverse features. Superimposed sigmoid descending and asked him oasis also with no adverse features. The large bowel is decompressed throughout. However, oral contrast has reached the ascending colon. Air and contrast distend the stomach, duodenum, and proximal small bowel loops, with the leading edge of small bowel contrast trailing off in mid jejunum and continued gas and fluid distended small bowel loops to the level of an above an abrupt transition in the central lower abdomen seen on series 2, image 69. Distal to that small bowel is decompressed and not opacified with  contrast to the terminal ileum. There is a small volume of interloop fluid in the small bowel mesentery. No free air. Only mild small bowel wall thickening is identified. Vascular/Lymphatic: Extensive Aortoiliac calcified atherosclerosis. Vascular patency is not evaluated in the absence of IV contrast. No lymphadenopathy identified. Reproductive: Diminutive or absent uterus and ovaries as before. Other: Small volume of free fluid in the pelvis is new with simple fluid density (series 2, image 79). Bilateral flank and lower abdominal wall subcutaneous edema. Musculoskeletal: Extensive previous posterior thoracolumbar spinal fusion which extends to the iliac wings appears stable. No acute osseous abnormality identified. IMPRESSION: 1. High-grade small-bowel obstruction with an abrupt transition point in the central lower abdomen on series 2, image 69. Favor adhesions. 2. Small volume of free fluid in the abdomen and pelvis, likely reactive and/or related to anasarca - with new left greater than right pleural effusions and bilateral body wall edema. 3. New right double-J ureteral stent with improved appearance of the right kidney. Urinary bladder decompressed by Foley catheter with residual bladder wall thickening. 4. Aortic Atherosclerosis (ICD10-I70.0). Electronically Signed: By: Genevie Ann M.D. On: 06/24/2020 13:36   DG Abd 1 View  Result Date: 06/25/2020 CLINICAL DATA:  Bowel obstruction. EXAM: ABDOMEN - 1 VIEW COMPARISON:  06/24/2020 FINDINGS: An NG tube is again identified with tip overlying the peripyloric region. Decreased bowel distension noted. No new or increasing dilated bowel loops are present. RIGHT ureteral stent and spinal surgical hardware again noted. IMPRESSION: Decreased bowel distension. Electronically Signed   By: Margarette Canada M.D.   On: 06/25/2020 07:42   DG Abd 1 View  Result Date: 06/24/2020 CLINICAL DATA:  NG tube placement EXAM: ABDOMEN - 1 VIEW COMPARISON:  06/23/2020 FINDINGS: Interval  placement of nasogastric tube which courses below the diaphragm. Distal tip terminates within the right hemiabdomen, likely within the distal stomach. Dilated loops of small bowel are again seen within the visualized abdomen, similar in caliber to the previous study. Extensive thoracolumbar fusion hardware. IMPRESSION: 1. Interval placement of nasogastric tube with distal tip terminating within the distal stomach. 2. Persistent small bowel obstruction. Electronically Signed   By: Davina Poke D.O.   On: 06/24/2020 14:33   DG ABD ACUTE 2+V W 1V CHEST  Result Date: 06/26/2020 CLINICAL DATA:  Small bowel obstruction. EXAM: X-RAY ABDOMEN 2 VIEW FINDINGS: The enteric tube tip still terminates in the right upper quadrant, presumably at the distal stomach. Continued small bowel distension. No visible pneumoperitoneum internal ureteral stent on the right. Extensive thoracic to pelvic spine fusion. Atelectatic opacity at the lower lobes. IMPRESSION: Ongoing small bowel obstruction without detected change from yesterday. Electronically Signed   By: Monte Fantasia M.D.   On: 06/26/2020 10:41   ASSESSMENT AND PLAN:  ELLORA VARNUM a 79 y.o.femalewith medical history significant forCOVID-19 viral infection(positive PCR test June 09, 2020),history of diabetes mellitus, history of rheumatoid arthritis on chronic immunosuppressive therapy, hypertension and a remote history of colon cancer who was brought into the ER by EMS for evaluation of fever. Patient had a T-max of 104Fat home.She complains of generalized weakness and worsening shortness of breath but denies having any nausea, no vomiting, no chest pain or abdominal pain. Patient does self caths at home. Patient is fully vaccinated She was started on prednisone by her primary care provider for presumed COVID-19 bronchitis   # SBO -- No N/V.  KUB showed SBO. --CT a/p with oral contrast showed "High-grade small-bowel obstruction with an abrupt  transition point in the central lower abdomen. Favor Adhesions." --GenSurg consult  With Dr Dahlia Byes appreciated, rec NG tube for decompression--trying to Clamp NG today  # Sepsis 2/2 Klebsiella bacteremia and  # right pyelonephritis  --As evidenced by fever with a T-max of103F,tachycardia, tachypnea, leukocytosis of 20,000 with a left shift and lactic acid of 3.1.  UA positive for infection.  Pt started on ceftriaxone, then broadened to zosyn on 9/19.  De-escalated to IV cipro on 9/23--change to IV Rocephin for 1 more day -9/26--complted 10 days of IV abxs -repeat blood culture from 19th September negative -Patient afebrile. White count stable  -recent CT abdomen no abdominal source of infection. --Urine grew pan-sensitive Klebsiella. --blood cx 2/2 pos for Klebsiella, repeat blood cx obtained on 9/21, neg growth so far.  # Severe right hydronephrosis --CT renal showed "Progressive right UPJ obstruction compared to prior October of 2020." --right stent placement by urology 9/18 (found frank pus in the kidney) --US renal 9/20 showed resolved hydro --continue Foley per urology Dr Jeffie Pollock  --strict I/O  # New onset Afib w RVR, persistent # Acquired thrombophilia  --developed while in the ED.  HR 120's-140's despite multiple doses of IV dilt ---started heparin gtt 9/18, transitioned to Eliquis  -9/25--off IV diltiazem--HR 66's Dr [facility] -cardiology  consulted -At home on regimen of lopressor 200 mg BID and Cardizem 120 mg daily --switch to heparin gtt for anticoagulation due to SBO--hoping to resume eliquis once NG is clmaped -- will switch to IV metoprolol if needed.  # Hematuria --noted on 9/23.  Hgb stable.  Pt needs to be on anticoagulation due to Afib. --Monitor Hgb  # COVID-19 infection --Pt is fully vaccinated.  Patient'sCOVID-19 PCR test was positive on 06/09/20.  No overt respiratory symptoms.  CT chest clear.  No real O2 requirement on presentation.  Currently on room  air. --Remdesivir completed this admisison --according to infection control, due to pt's immunosuppressed status PTA, pt needs the 21 day isolation.  AKI, worsening Metabolic acidosis, uncompensated --on admission creatinine is 1.5.  Baseline Cr 0.8.  Acute renal failure secondary to ATN with sepsis, obstructive uropathy, IV contrast nephropathy and prolonged prerenal azotemia. --US renal showed resolved hydro and medical renal disease --nephrology consulted --bicarb 33. ABG looks better today (9/26) --start sodium bicarb D5 infusion @100  ml/hr, per nephrology--now d/ced IVF -Creat 6.14. Dr Theador Hawthorne has spoken with patient and husband regarding starting hemodialysis.  Lactic acidosis 2/2 sepsis --resolved with IVF.  Hx Diabetes mellitus  hyperglycemia related to systemic steroid administration Patient was started on prednisone as an outpatient by her primary care provider for presumed COVID-19 bronchitis Chest x-ray is negative for pneumonia and patient is not hypoxic Prednisone d/c'ed. PLAN: --SSI TID  Hx of HTN--uncontrolled --BP drops sometimes from a lot of rate control agents --BP wnl now --IV hydralazine 25 mg tid   History of rheumatoid arthritis --continue to hold [place] due to sepsis  Hypokalemia and hypomag, not POA --monitor and replete PRN  HLD --continue home statin  Hypocalcemia Calcium 6.4 today, corrected Calcium calculated at 7.1 -IV calcium gluconate x1  Anemia with renal failure: Hgb stable in 10's --Monitor   Hypoalbuminemia  --albumin 2.7 --received couple days of IV albumin, per nephrology--now d/c per Dr Theador Hawthorne  patient has multiple comorbidities. Overall has poor long-term prognosis. Palliative care consultation.  DVT prophylaxis: [id number] gtt Code Status: DNR  Family Communication: husband updated on the phone today by dr Gardiner Rhyme Status is: inpatient Dispo:   The patient is from: home Anticipated d/c is to:  home Anticipated d/c date is: >3 days Patient currently is not medically stable to d/c due to: worsening AKI, new SBO, on NG tube for decompression.          TOTAL TIME TAKING CARE OF THIS PATIENT: *25* minutes.  >50% time spent on counselling and coordination of care  Note: This dictation was prepared with Dragon dictation along with smaller phrase technology. Any transcriptional errors that result from this process are unintentional.  Fritzi Mandes M.D    [facility]   CC: Primary care physician; Rusty Aus, MDPatient ID: [name], female   DOB: 1941/07/27, 79 y.o.   MRN: [id number]

## 2020-06-26 NOTE — Progress Notes (Signed)
Patient was c/o 8/10 back pain. Messaged MD, new order for Morphine 0.5mg  q 8 hrs PRN. MD aware of allergies.

## 2020-06-27 ENCOUNTER — Encounter: Payer: Self-pay | Admitting: Internal Medicine

## 2020-06-27 ENCOUNTER — Inpatient Hospital Stay: Payer: Medicare Other

## 2020-06-27 ENCOUNTER — Encounter
Admission: EM | Disposition: A | Discharge status: Skilled Nursing Facility | Payer: Self-pay | Source: Home / Self Care | Attending: Internal Medicine

## 2020-06-27 DIAGNOSIS — K56609 Unspecified intestinal obstruction, unspecified as to partial versus complete obstruction: Secondary | ICD-10-CM

## 2020-06-27 DIAGNOSIS — Z79899 Other long term (current) drug therapy: Secondary | ICD-10-CM

## 2020-06-27 DIAGNOSIS — Z7189 Other specified counseling: Secondary | ICD-10-CM

## 2020-06-27 DIAGNOSIS — N185 Chronic kidney disease, stage 5: Secondary | ICD-10-CM | POA: Diagnosis not present

## 2020-06-27 DIAGNOSIS — Z4901 Encounter for fitting and adjustment of extracorporeal dialysis catheter: Secondary | ICD-10-CM

## 2020-06-27 DIAGNOSIS — Z992 Dependence on renal dialysis: Secondary | ICD-10-CM

## 2020-06-27 DIAGNOSIS — U071 COVID-19: Secondary | ICD-10-CM

## 2020-06-27 DIAGNOSIS — N186 End stage renal disease: Secondary | ICD-10-CM

## 2020-06-27 DIAGNOSIS — Z515 Encounter for palliative care: Secondary | ICD-10-CM

## 2020-06-27 HISTORY — PX: TEMPORARY DIALYSIS CATHETER: CATH118312

## 2020-06-27 LAB — HEPARIN LEVEL (UNFRACTIONATED): Heparin Unfractionated: 1.08 IU/mL — ABNORMAL HIGH (ref 0.30–0.70)

## 2020-06-27 LAB — GLUCOSE, CAPILLARY
Glucose-Capillary: 187 mg/dL — ABNORMAL HIGH (ref 70–99)
Glucose-Capillary: 188 mg/dL — ABNORMAL HIGH (ref 70–99)
Glucose-Capillary: 163 mg/dL — ABNORMAL HIGH (ref 70–99)
Glucose-Capillary: 155 mg/dL — ABNORMAL HIGH (ref 70–99)
Glucose-Capillary: 159 mg/dL — ABNORMAL HIGH (ref 70–99)
Glucose-Capillary: 160 mg/dL — ABNORMAL HIGH (ref 70–99)
Glucose-Capillary: 137 mg/dL — ABNORMAL HIGH (ref 70–99)

## 2020-06-27 LAB — PROTIME-INR
INR: 1.6 — ABNORMAL HIGH (ref 0.8–1.2)
Prothrombin Time: 18.6 seconds — ABNORMAL HIGH (ref 11.4–15.2)

## 2020-06-27 LAB — CBC
HCT: 24.6 % — ABNORMAL LOW (ref 36.0–46.0)
Hemoglobin: 9.2 g/dL — ABNORMAL LOW (ref 12.0–15.0)
MCH: 27.4 pg (ref 26.0–34.0)
MCHC: 37.4 g/dL — ABNORMAL HIGH (ref 30.0–36.0)
MCV: 73.2 fL — ABNORMAL LOW (ref 80.0–100.0)
Platelets: 157 10*3/uL (ref 150–400)
RBC: 3.36 MIL/uL — ABNORMAL LOW (ref 3.87–5.11)
RDW: 14.8 % (ref 11.5–15.5)
WBC: 5.3 10*3/uL (ref 4.0–10.5)
nRBC: 0 % (ref 0.0–0.2)

## 2020-06-27 LAB — APTT: aPTT: 101 seconds — ABNORMAL HIGH (ref 24–36)

## 2020-06-27 SURGERY — TEMPORARY DIALYSIS CATHETER
Anesthesia: Moderate Sedation

## 2020-06-27 MED ORDER — PANTOPRAZOLE SODIUM 40 MG IV SOLR
40.0000 mg | INTRAVENOUS | Status: DC
  Administered 2020-06-27 – 2020-07-09 (×13): 40 mg via INTRAVENOUS
  Filled 2020-06-27 (×13): qty 40

## 2020-06-27 SURGICAL SUPPLY — 3 items
KIT CV MULTILUMEN 7FR 20 (SET/KITS/TRAYS/PACK) ×2
KIT CV MULTILUMEN 7FR 20 SUB (SET/KITS/TRAYS/PACK) ×1 IMPLANT
PACK ANGIOGRAPHY (CUSTOM PROCEDURE TRAY) ×2 IMPLANT

## 2020-06-27 NOTE — TOC Progression Note (Signed)
Transition of Care [facility]) - Progression Note    Patient Details  Name: Stephanie [name] MRN: [id number] Date of Birth: 1941/05/04  [facility]) CM/SW Contact  Shelbie Hutching, RN Phone Number: 06/27/2020, 1:37 PM  Clinical Narrative:    Husband [name] called this morning and requested return call from [facility] team.  RNCM reached out to [place] and he would like for the patient to go to [facility] or [place] at discharge.  [facility] offered [place] a bed and husband accepts.  Patient is not ready for discharge yet, she is being followed by nephrology and will be getting dialysis, unsure if patient will need long term dialysis.    Expected Discharge Plan: [place] Barriers to Discharge: Continued Medical Work up  Expected Discharge Plan and Services Expected Discharge Plan: [place] In-house Referral: NA   Post Acute Care Choice: NA Living arrangements for the past 2 months: Single Family Home                                       Social Determinants of Health (SDOH) Interventions    Readmission Risk Interventions Readmission Risk Prevention Plan 06/21/2020 06/19/2020  Transportation Screening Complete Complete  PCP or Specialist Appt within 3-5 Days Complete -  HRI or Home Care Consult Complete -  Social Work Consult for [place] Planning/Counseling Complete -  Palliative Care Screening Not Applicable -  Medication Review [employer]) (No Data) Complete  PCP or Specialist appointment within 3-5 days of discharge - Complete  HRI or [place] - Complete  SW Recovery Care/Counseling Consult - Complete  [place] - Not Applicable  Some recent data might be hidden

## 2020-06-27 NOTE — Progress Notes (Signed)
PT Cancellation Note  Patient Details Name: Stephanie [name] MRN: [id number] DOB: 03-Apr-1941   Cancelled Treatment:    Reason Eval/Treat Not Completed: Patient at procedure or test/unavailable (Chart reviewed for attempted treatment session.  Patient currently off unit for diagnostic procedure (temp dialysis cath placement).  Will re-attempt next date as medically appropriate and available, noting restrictions related to temp dialysis cath.)  Jozi Malachi H. Owens Shark, PT, DPT, NCS 06/27/20, 3:39 PM [phone]

## 2020-06-27 NOTE — Progress Notes (Signed)
Report called to [employer] on IC .

## 2020-06-27 NOTE — Progress Notes (Addendum)
CC: SBO   Subjective: Feeling better this am, drinking a lot of ice chips ( had 2-3 cups yesterday) HAd BM. KUB was personally  reviewed showing some improvement of a bowel dilation with gas within the colon.  As far as Gastrografin challenge we were unable to see any contrast.  Possible explanations would include either resorption of the Gastrografin versus early suction of the Gastrografin via the NG tube as a technical error. INR has improved persistent acute kidney injury.  She is scheduled to have dialysis catheter today. Case d/w Dr. Posey Pronto in detail  Objective: Vital signs in last 24 hours: Temp:  [97.5 F (36.4 C)-98.7 F (37.1 C)] 97.6 F (36.4 C) (09/27 1121) Pulse Rate:  [76-87] 79 (09/27 1154) Resp:  [13-21] 20 (09/27 1121) BP: (125-176)/(50-64) 128/59 (09/27 1154) SpO2:  [92 %-96 %] 95 % (09/27 1121) Last BM Date: 06/26/20  Intake/Output from previous day: 09/26 0701 - 09/27 0700 In: 3134.7 [I.V.:1996.9; NG/GT:900; IV Piggyback:237.8] Out: 2050 [Urine:1000; Emesis/NG output:1050] Intake/Output this shift: No intake/output data recorded.  Physical exam: Dilatated.  MALNourished ABD: SOFT, DECREASE BS, NON TENDER, no peritonitis Ext: no edema and well perfused  Lab Results: CBC  Recent Labs    06/26/20 0510 06/27/20 0545  WBC 6.7 5.3  HGB 8.9* 9.2*  HCT 24.0* 24.6*  PLT 184 157   BMET Recent Labs    06/25/20 0731 06/26/20 0510  NA 124* 127*  K 4.0 3.4*  CL 95* 90*  CO2 14* 20*  GLUCOSE 179* 186*  BUN 94* 98*  CREATININE 5.96* 6.14*  CALCIUM 6.5* 6.4*   PT/INR Recent Labs    06/26/20 0510 06/27/20 0545  LABPROT 20.2* 18.6*  INR 1.8* 1.6*   ABG Recent Labs    06/25/20 1130 06/26/20 1130  PHART 7.33* 7.40  HCO3 14.2* 20.4    Studies/Results: DG ABD ACUTE 2+V W 1V CHEST  Result Date: 06/26/2020 CLINICAL DATA:  Small bowel obstruction. EXAM: X-RAY ABDOMEN 2 VIEW FINDINGS: The enteric tube tip still terminates in the right upper  quadrant, presumably at the distal stomach. Continued small bowel distension. No visible pneumoperitoneum internal ureteral stent on the right. Extensive thoracic to pelvic spine fusion. Atelectatic opacity at the lower lobes. IMPRESSION: Ongoing small bowel obstruction without detected change from yesterday. Electronically Signed   By: Monte Fantasia M.D.   On: 06/26/2020 10:41   DG Abd Portable 1V-Small Bowel Obstruction Protocol-initial, 8 hr delay  Result Date: 06/26/2020 CLINICAL DATA:  Small bowel obstruction.  8 hour delay EXAM: PORTABLE ABDOMEN - 1 VIEW COMPARISON:  06/26/2020 FINDINGS: Enteric tube is present with tip in the right upper quadrant, likely within the distal stomach. Gaseous distention of multiple small bowel loops consistent with small bowel obstruction. No radiopaque contrast material is demonstrated in the stomach or bowel. A right ureteral stent is present. Postoperative fixation of the lower thoracic and lumbosacral spine. IMPRESSION: Gaseous distention of multiple small bowel loops consistent with small bowel obstruction. No radiopaque contrast material is demonstrated in the stomach or bowel. Electronically Signed   By: Lucienne Capers M.D.   On: 06/26/2020 22:54   DG Abd Portable 2V  Result Date: 06/27/2020 CLINICAL DATA:  Ileus. Abdominal pain and distention. COVID positive. EXAM: X-RAY ABDOMEN 3 VIEWS COMPARISON:  06/26/2020. FINDINGS: NG tube noted in stable position. Right double-J ureteral stent stable position. Persistent distended loops of small bowel noted, improved from prior exam. Colonic gas pattern is unremarkable. No free air identified. Prior thoracolumbar and lumbosacral  fusion. IMPRESSION: 1. NG tube and right double-J ureteral stent in stable position. 2. Persistent distended loops of small bowel, improved from prior exam. Colonic gas pattern is unremarkable. Electronically Signed   By: Marcello Moores  Register   On: 06/27/2020 07:34     Anti-infectives: Anti-infectives (From admission, onward)   Start     Dose/Rate Route Frequency Ordered Stop   06/26/20 1000  cefTRIAXone (ROCEPHIN) 2 g in sodium chloride 0.9 % 100 mL IVPB        2 g 200 mL/hr over 30 Minutes Intravenous Every 24 hours 06/25/20 1138 06/26/20 1112   06/25/20 1000  ciprofloxacin (CIPRO) IVPB 400 mg  Status:  Discontinued        400 mg 200 mL/hr over 60 Minutes Intravenous Every 24 hours 06/24/20 1234 06/26/20 0919   06/23/20 1000  ciprofloxacin (CIPRO) tablet 500 mg  Status:  Discontinued        500 mg Oral Daily 06/23/20 0927 06/24/20 1234   06/20/20 1600  piperacillin-tazobactam (ZOSYN) IVPB 3.375 g  Status:  Discontinued        3.375 g 12.5 mL/hr over 240 Minutes Intravenous Every 12 hours 06/20/20 1217 06/23/20 0926   06/19/20 2000  piperacillin-tazobactam (ZOSYN) IVPB 3.375 g  Status:  Discontinued        3.375 g 12.5 mL/hr over 240 Minutes Intravenous Every 8 hours 06/19/20 1915 06/20/20 1217   06/18/20 1800  cefTRIAXone (ROCEPHIN) 2 g in sodium chloride 0.9 % 100 mL IVPB  Status:  Discontinued        2 g 200 mL/hr over 30 Minutes Intravenous Every 24 hours 06/17/20 1545 06/17/20 1545   06/18/20 1000  remdesivir 100 mg in sodium chloride 0.9 % 100 mL IVPB       "Followed by" Linked Group Details   100 mg 200 mL/hr over 30 Minutes Intravenous Daily 06/17/20 0809 06/21/20 0954   06/18/20 0800  azithromycin (ZITHROMAX) 500 mg in sodium chloride 0.9 % 250 mL IVPB  Status:  Discontinued        500 mg 250 mL/hr over 60 Minutes Intravenous Every 24 hours 06/17/20 0805 06/17/20 0914   06/17/20 1800  cefTRIAXone (ROCEPHIN) 2 g in sodium chloride 0.9 % 100 mL IVPB  Status:  Discontinued        2 g 200 mL/hr over 30 Minutes Intravenous Every 24 hours 06/17/20 1545 06/19/20 1915   06/17/20 1500  cefTRIAXone (ROCEPHIN) 2 g in sodium chloride 0.9 % 100 mL IVPB  Status:  Discontinued        2 g 200 mL/hr over 30 Minutes Intravenous Every 24 hours 06/17/20  0805 06/17/20 1545   06/17/20 1000  remdesivir 200 mg in sodium chloride 0.9% 250 mL IVPB       "Followed by" Linked Group Details   200 mg 580 mL/hr over 30 Minutes Intravenous Once 06/17/20 0809 06/17/20 1216   06/17/20 0530  ceFEPIme (MAXIPIME) 1 g in sodium chloride 0.9 % 100 mL IVPB        1 g 200 mL/hr over 30 Minutes Intravenous  Once 06/17/20 0515 06/17/20 0810   06/17/20 0530  vancomycin (VANCOCIN) IVPB 1000 mg/200 mL premix        1,000 mg 200 mL/hr over 60 Minutes Intravenous  Once 06/17/20 0515 06/17/20 0929   06/17/20 0530  azithromycin (ZITHROMAX) tablet 500 mg        500 mg Oral  Once 06/17/20 0515 06/17/20 0723      Assessment/Plan:  Partial  SBo w some partial improvement, no alarming signs Continue NGT for now,  Start TPN, dietician and pharmacy consulted No emergent surgical intervention I would rather wait and get her optimized from renal perspective before major laparotomy If by Wednesday she still has not improved we will proceed with exploratory laparotomy. She wishes to avoid this if possible . Please note that I spent more than 35 minutes in this encounter with greater than 50% spent in coordination and counseling of her care   Caroleen Hamman, MD, [facility]  06/27/2020

## 2020-06-27 NOTE — Progress Notes (Signed)
Pt received from floor. VS obtained.

## 2020-06-27 NOTE — Progress Notes (Signed)
OT Cancellation Note  Patient Details Name: Stephanie [name] MRN: [id number] DOB: 1941/06/15   Cancelled Treatment:    Reason Eval/Treat Not Completed: Patient at procedure or test/ unavailable  Pt off the floor for procedure at this time. Will f/u as able for OT treatment. Thank you.  Gerrianne Scale, [place], OTR/L ascom [phone] 06/27/20, 4:40 PM

## 2020-06-27 NOTE — Op Note (Signed)
  OPERATIVE NOTE   PROCEDURE:  1. Insertion of triple-lumen central venous catheter via centimeters right common femoral approach with ultrasound. 2.  Insertion of temporary dialysis catheter catheter right IJ  approach with ultrasound guidance.  PRE-OPERATIVE DIAGNOSIS: Small bowel obstruction requiring TPN; COVID-19 infection; lack of appropriate IV access; acute on chronic renal failure  POST-OPERATIVE DIAGNOSIS: Same  SURGEON: Katha Cabal M.D.  ANESTHESIA: 1% lidocaine local infiltration  ESTIMATED BLOOD LOSS: Minimal cc  INDICATIONS:   Stephanie [name] is a 79 y.o. female who presents with small bowel obstruction associated with COVID-19.  She has now progressed to the point where her chronic renal insufficiency has worsened acutely and she requires hemodialysis.  She will also be requiring TPN.  She is therefore undergoing placement of both a central line as well as a tri-Allises catheter as TPN cannot be administered through a dialysis catheter.  The risks and benefits were reviewed all questions answered patient agrees to proceed.  DESCRIPTION: After obtaining full informed written consent, the patient was positioned supine. The right neck and chest wall as well as the right groin was prepped and draped in a sterile fashion. Ultrasound was placed in a sterile sleeve. Ultrasound was utilized to identify the right common femoral vein which is noted to be echolucent and compressible indicating patency. Images recorded for the permanent record. Under real-time visualization a Seldinger needle is inserted into the vein and the guidewires advanced without difficulty. Small counterincision was made at the wire insertion site. Dilator is passed over the wire and the temporary dialysis catheter catheter is fed over the wire without difficulty.  All lumens aspirate and flush easily and are packed with heparin saline. Catheter secured to the skin of the right thigh with 2-0 silk. A sterile  dressing is applied with Biopatch.   Attention was then turned to the neck for insertion of the central line, the patient was positioned supine. The right neck and chest wall was prepped and draped in a sterile fashion. Ultrasound was placed in a sterile sleeve. Ultrasound was utilized to identify the right internal jugular vein which is noted to be echolucent and compressible indicating patency. Images recorded for the permanent record. Under real-time visualization a Seldinger needle is inserted into the vein and the guidewires advanced without difficulty. Small counterincision was made at the wire insertion site. Dilators passed over the wire and the triple-lumen catheter is fed without difficulty.  All 3 lm aspirate and flush easily and are packed with heparin saline. Catheter secured to the skin of the right neck with 2-0 silk. A sterile dressing is applied with Biopatch.  COMPLICATIONS: None  CONDITION: Stable  Katha Cabal, M.D. [place] [facility]. Office:  [phone]   06/27/2020, 4:49 PM

## 2020-06-27 NOTE — Consult Note (Signed)
[facility] Vascular Consult Note  MRN : [id number]  Stephanie [name] is a 79 y.o. (Dec 15, 1940) female who presents with chief complaint of  Chief Complaint  Patient presents with  . Shortness of Breath   History of Present Illness:  Patient has a past medical history significant for COVID-19 viral infection (positive PCR test June 09, 2020), history of diabetes mellitus, history of rheumatoid arthritis on chronic immunosuppressive therapy, hypertension and a remote history of colon cancer who was brought into the ER by EMS for evaluation of fever (Tmax 104) and shortness of breath.   During the patient's stay, she was found to have a small bowel obstruction s/p NGT insertion, sepsis secondary to Klebsiella bacteremia, right pyelonephritis, new onset A. fib with worsening acute on chronic kidney function.   06/26/20: Creatinine: 6.14 BUN: 98  Nephrology would like to initiate hemodialysis at this time however the patient does not have an adequate access at this time. We are asked to place a temporary dialysis catheter for immediate dialysis use.  Vascular surgery was consulted by Dr. Posey Pronto for insertion of a temporary dialysis catheter.  Current Facility-Administered Medications  Medication Dose Route Frequency Provider Last Rate Last Admin  . 0.9 %  sodium chloride infusion  250 mL Intravenous PRN Liana Gerold, MD 10 mL/hr at 06/26/20 1430 Restarted at 06/26/20 1430  . acetaminophen (TYLENOL) tablet 650 mg  650 mg Oral Q6H PRN Irine Seal, MD   650 mg at 06/24/20 0334  . albuterol (VENTOLIN HFA) 108 (90 Base) MCG/ACT inhaler 2 puff  2 puff Inhalation Q4H PRN Sharion Settler, NP      . ascorbic acid (VITAMIN C) tablet 500 mg  500 mg Oral Daily Irine Seal, MD   500 mg at 06/24/20 0907  . brimonidine (ALPHAGAN) 0.2 % ophthalmic solution 1 drop  1 drop Both Eyes BID Irine Seal, MD   1 drop at 06/27/20 0839  . chlorhexidine (PERIDEX) 0.12 % solution 15 mL  15  mL Mouth Rinse BID Sharion Settler, NP   15 mL at 06/27/20 1024  . Chlorhexidine Gluconate Cloth 2 % PADS 6 each  6 each Topical Q0600 Irine Seal, MD   6 each at 06/26/20 315 252 2135  . guaiFENesin-dextromethorphan (ROBITUSSIN DM) 100-10 MG/5ML syrup 10 mL  10 mL Oral Q4H PRN Irine Seal, MD   10 mL at 06/23/20 0004  . heparin ADULT infusion 100 units/mL (25000 units/224mL sodium chloride 0.45%)  750 Units/hr Intravenous Continuous Fritzi Mandes, MD 7.5 mL/hr at 06/27/20 0300 750 Units/hr at 06/27/20 0300  . hydrALAZINE (APRESOLINE) injection 10 mg  10 mg Intravenous Q6H Fritzi Mandes, MD   10 mg at 06/27/20 0836  . insulin aspart (novoLOG) injection 0-9 Units  0-9 Units Subcutaneous Q4H Sharion Settler, NP   2 Units at 06/27/20 1150  . MEDLINE mouth rinse  15 mL Mouth Rinse BID Irine Seal, MD   15 mL at 06/27/20 1025  . metoprolol tartrate (LOPRESSOR) injection 5 mg  5 mg Intravenous Q6H Fay Records, MD   5 mg at 06/27/20 1205  . morphine 2 MG/ML injection 0.5 mg  0.5 mg Intravenous TID PRN Fritzi Mandes, MD   0.5 mg at 06/26/20 1739  . ondansetron (ZOFRAN) injection 4 mg  4 mg Intravenous Q6H PRN Irine Seal, MD   4 mg at 06/26/20 1410  . pantoprazole (PROTONIX) injection 40 mg  40 mg Intravenous Q24H Fritzi Mandes, MD   40 mg at 06/27/20 1100  .  protein supplement (ENSURE MAX) liquid  11 oz Oral TID Enzo Bi, MD   11 oz at 06/23/20 2052  . sodium bicarbonate 150 mEq in dextrose 5 % 1,000 mL infusion   Intravenous Continuous Murlean Iba, MD 30 mL/hr at 06/27/20 1018 Rate Change at 06/27/20 1018  . sodium chloride flush (NS) 0.9 % injection 10-40 mL  10-40 mL Intracatheter Q12H Enzo Bi, MD   10 mL at 06/27/20 0839  . sodium chloride flush (NS) 0.9 % injection 3 mL  3 mL Intravenous Q12H Irine Seal, MD   3 mL at 06/26/20 0918  . sodium chloride flush (NS) 0.9 % injection 3 mL  3 mL Intravenous PRN Irine Seal, MD   3 mL at 06/22/20 0422  . tamsulosin (FLOMAX) capsule 0.4 mg  0.4 mg Oral Daily Irine Seal, MD   0.4 mg at 06/24/20 0907  . zinc sulfate capsule 220 mg  220 mg Oral Daily Irine Seal, MD   220 mg at 06/24/20 5643   Past Medical History:  Diagnosis Date  . Arthritis   . Asthma   . Cancer ([place]) 1996   colon  . Diabetes mellitus without complication ([place])   . GERD (gastroesophageal reflux disease)   . Glaucoma   . Hyperlipidemia   . Hypertension   . Lumbar disc disease   . Small bowel obstruction ([place])   . Vitamin B 12 deficiency    Past Surgical History:  Procedure Laterality Date  . ABDOMINAL HYSTERECTOMY    . BACK SURGERY    . COLON SURGERY    . COLONOSCOPY WITH PROPOFOL N/A 05/22/2017   Procedure: COLONOSCOPY WITH PROPOFOL;  Surgeon: Manya Silvas, MD;  Location[facility] END[facility]COPY;  Service: Endoscopy;  Laterality: N/A;  . CYSTOSCOPY WITH STENT PLACEMENT Right 06/18/2020   Procedure: CYSTOSCOPY WITH STENT PLACEMENT;  Surgeon: Irine Seal, MD;  Location: [facility] ORS;  Service: Urology;  Laterality: Right;  . FRACTURE SURGERY    . KNEE ARTHROSCOPY    . LUMBAR LAMINECTOMY     Social History Social History   Tobacco Use  . Smoking status: Never Smoker  . Smokeless tobacco: Never Used  Vaping Use  . Vaping Use: Never used  Substance Use Topics  . Alcohol use: No  . Drug use: No   Family History Family History  Problem Relation Age of Onset  . Breast cancer Neg Hx   Denies family history of peripheral artery disease, venous disease or renal disease.  Allergies  Allergen Reactions  . Ibuprofen Itching  . Indomethacin   . Infliximab   . Methotrexate Derivatives   . Moexipril   . Percocet [Oxycodone-Acetaminophen] Itching  . Naprosyn [Naproxen] Rash   REVIEW OF SYSTEMS (Negative unless checked)  Constitutional: [] Weight loss  [x] Fever  [] Chills Cardiac: [] Chest pain   [] Chest pressure   [] Palpitations   [x] Shortness of breath when laying flat   [x] Shortness of breath at rest   [x] Shortness of breath with exertion. Vascular:  [] Pain in legs with  walking   [] Pain in legs at rest   [] Pain in legs when laying flat   [] Claudication   [] Pain in feet when walking  [] Pain in feet at rest  [] Pain in feet when laying flat   [] History of DVT   [] Phlebitis   [] Swelling in legs   [] Varicose veins   [] Non-healing ulcers Pulmonary:   [] Uses home oxygen   [] Productive cough   [] Hemoptysis   [] Wheeze  [] COPD   [] Asthma Neurologic:  [] Dizziness  []   Blackouts   [] Seizures   [] History of stroke   [] History of TIA  [] Aphasia   [] Temporary blindness   [] Dysphagia   [] Weakness or numbness in arms   [] Weakness or numbness in legs Musculoskeletal:  [] Arthritis   [] Joint swelling   [] Joint pain   [] Low back pain Hematologic:  [] Easy bruising  [] Easy bleeding   [] Hypercoagulable state   [] Anemic  [] Hepatitis Gastrointestinal:  [] Blood in stool   [] Vomiting blood  [] Gastroesophageal reflux/heartburn   [] Difficulty swallowing. Genitourinary:  [x] Chronic kidney disease   [] Difficult urination  [] Frequent urination  [] Burning with urination   [] Blood in urine Skin:  [] Rashes   [] Ulcers   [] Wounds Psychological:  [] History of anxiety   []  History of major depression.  Physical Examination  Vitals:   06/27/20 1121 06/27/20 1154 06/27/20 1154 06/27/20 1200  BP: (!) 128/50 (!) 125/59 (!) 128/59   Pulse: 79 79 79 76  Resp: 20     Temp: 97.6 F (36.4 C)     TempSrc: Oral     SpO2: 95%   96%  Weight:      Height:       Body mass index is 30.89 kg/m. Gen: WD/WN Head: [place]/AT, No temporalis wasting Ear/Nose/Throat: Hearing grossly intact, nares w/o erythema or drainage Eyes: Sclera non-icteric, conjunctiva clear Neck: Supple, no nuchal rigidity.  No JVD.  Pulmonary:  Good air movement, clear to auscultation bilaterally.  Cardiac: RRR, normal S1, S2, no Murmurs, rubs or gallops. Vascular: Extremities are warm distally to toes Gastrointestinal:  NG tube: Intact, sumping, bilious drainage soft, non-tender/non-distended. No guarding/reflex.  Musculoskeletal:  M/S  5/5 throughout.  Extremities without ischemic changes.  No deformity or atrophy.  Mid edema in the lower extremities bilaterally Neurologic: Intact, no deficits noted Psychiatric: Difficult to assess due to the severity of patient's illness. Dermatologic: No rashes or ulcers noted.   Lymph : No Cervical, Axillary, or Inguinal lymphadenopathy.  CBC Lab Results  Component Value Date   WBC 5.3 06/27/2020   HGB 9.2 (L) 06/27/2020   HCT 24.6 (L) 06/27/2020   MCV 73.2 (L) 06/27/2020   PLT 157 06/27/2020   BMET    Component Value Date/Time   NA 127 (L) 06/26/2020 0510   NA 132 (L) 01/14/2015 0457   K 3.4 (L) 06/26/2020 0510   K 3.9 01/14/2015 0457   CL 90 (L) 06/26/2020 0510   CL 100 (L) 01/14/2015 0457   CO2 20 (L) 06/26/2020 0510   CO2 27 01/14/2015 0457   GLUCOSE 186 (H) 06/26/2020 0510   GLUCOSE 189 (H) 01/14/2015 0457   BUN 98 (H) 06/26/2020 0510   BUN 12 01/14/2015 0457   CREATININE 6.14 (H) 06/26/2020 0510   CREATININE 0.66 01/14/2015 0457   CALCIUM 6.4 (LL) 06/26/2020 0510   CALCIUM 8.7 (L) 01/14/2015 0457   GFRNONAA 6 (L) 06/26/2020 0510   GFRNONAA >60 01/14/2015 0457   GFRAA 7 (L) 06/26/2020 0510   GFRAA >60 01/14/2015 0457   Estimated Creatinine Clearance: 8.5 mL/min (A) (by C-G formula based on SCr of 6.14 mg/dL (H)).  COAG Lab Results  Component Value Date   INR 1.6 (H) 06/27/2020   INR 1.8 (H) 06/26/2020   INR 2.1 (H) 06/25/2020   Radiology CT ABDOMEN PELVIS WO CONTRAST  Addendum Date: 06/24/2020   ADDENDUM REPORT: 06/24/2020 14:16 ADDENDUM: Study discussed by telephone with Dr. Dahlia Byes on 06/24/2020 at 14:13. He advises that the patient is not an ideal surgical candidate at this time, and I agree  that a trial of conservative treatment for the presumed adhesion related small-bowel obstruction seems reasonable. I reiterated that this does not appear to be a closed loop obstruction, and that no portal venous gas or other complicating features are identified.  Electronically Signed   By: Genevie Ann M.D.   On: 06/24/2020 14:16   Result Date: 06/24/2020 CLINICAL DATA:  79 year old female with abnormal urinary tracks on recent noncontrast CT. Abnormal bowel-gas pattern on KUB yesterday suspicious for small bowel obstruction or ileus. EXAM: CT ABDOMEN AND PELVIS WITHOUT CONTRAST TECHNIQUE: Multidetector CT imaging of the abdomen and pelvis was performed following the standard protocol without IV contrast. COMPARISON:  Noncontrast CT Abdomen and Pelvis 06/17/2020. KUB yesterday. FINDINGS: Lower chest: Small layering left pleural effusion is new from the prior CT, along with trace right pleural fluid. Mild lower lobe atelectasis on the left. No pericardial effusion. Calcified aortic atherosclerosis. Hepatobiliary: Small volume simple appearing perihepatic free fluid is new. The gallbladder is moderately distended with evidence of dependent sludge, but no convincing pericholecystic inflammation. Negative noncontrast liver. Pancreas: Negative. Spleen: Negative. Adrenals/Urinary Tract: Normal adrenal glands. Stable left kidney and ureter. New right double-J ureteral stent in place. Proximal and distal pigtails appear well position. Regressed right hydronephrosis and right perinephric space edema/inflammation. Decompressed urinary bladder now by Foley catheter, although there is residual bladder wall thickening. Stomach/Bowel: Rectosigmoid anastomosis in the pelvis with no adverse features. Superimposed sigmoid descending and asked him oasis also with no adverse features. The large bowel is decompressed throughout. However, oral contrast has reached the ascending colon. Air and contrast distend the stomach, duodenum, and proximal small bowel loops, with the leading edge of small bowel contrast trailing off in mid jejunum and continued gas and fluid distended small bowel loops to the level of an above an abrupt transition in the central lower abdomen seen on series 2, image 69.  Distal to that small bowel is decompressed and not opacified with contrast to the terminal ileum. There is a small volume of interloop fluid in the small bowel mesentery. No free air. Only mild small bowel wall thickening is identified. Vascular/Lymphatic: Extensive Aortoiliac calcified atherosclerosis. Vascular patency is not evaluated in the absence of IV contrast. No lymphadenopathy identified. Reproductive: Diminutive or absent uterus and ovaries as before. Other: Small volume of free fluid in the pelvis is new with simple fluid density (series 2, image 79). Bilateral flank and lower abdominal wall subcutaneous edema. Musculoskeletal: Extensive previous posterior thoracolumbar spinal fusion which extends to the iliac wings appears stable. No acute osseous abnormality identified. IMPRESSION: 1. High-grade small-bowel obstruction with an abrupt transition point in the central lower abdomen on series 2, image 69. Favor adhesions. 2. Small volume of free fluid in the abdomen and pelvis, likely reactive and/or related to anasarca - with new left greater than right pleural effusions and bilateral body wall edema. 3. New right double-J ureteral stent with improved appearance of the right kidney. Urinary bladder decompressed by Foley catheter with residual bladder wall thickening. 4. Aortic Atherosclerosis (ICD10-I70.0). Electronically Signed: By: Genevie Ann M.D. On: 06/24/2020 13:36   DG Abd 1 View  Result Date: 06/25/2020 CLINICAL DATA:  Bowel obstruction. EXAM: ABDOMEN - 1 VIEW COMPARISON:  06/24/2020 FINDINGS: An NG tube is again identified with tip overlying the peripyloric region. Decreased bowel distension noted. No new or increasing dilated bowel loops are present. RIGHT ureteral stent and spinal surgical hardware again noted. IMPRESSION: Decreased bowel distension. Electronically Signed   By: Cleatis Polka.D.  On: 06/25/2020 07:42   DG Abd 1 View  Result Date: 06/24/2020 CLINICAL DATA:  NG tube placement  EXAM: ABDOMEN - 1 VIEW COMPARISON:  06/23/2020 FINDINGS: Interval placement of nasogastric tube which courses below the diaphragm. Distal tip terminates within the right hemiabdomen, likely within the distal stomach. Dilated loops of small bowel are again seen within the visualized abdomen, similar in caliber to the previous study. Extensive thoracolumbar fusion hardware. IMPRESSION: 1. Interval placement of nasogastric tube with distal tip terminating within the distal stomach. 2. Persistent small bowel obstruction. Electronically Signed   By: Davina Poke D.O.   On: 06/24/2020 14:33   DG Abd 1 View  Result Date: 06/23/2020 CLINICAL DATA:  Pyelonephritis, stent placement EXAM: ABDOMEN - 1 VIEW COMPARISON:  06/17/2020 FINDINGS: Three supine frontal views of the abdomen and pelvis are obtained. Right ureteral stent identified proximal aspect overlying right renal silhouette and distal aspect overlying the lower pelvis. There is diffuse gaseous distension of the bowel, which may reflect ileus. Maximal diameter of small bowel measures 5.4 cm. There are no masses or abnormal calcifications. Extensive postsurgical changes from thoracolumbar fusion. IMPRESSION: 1. Right ureteral stent as above. 2. Gaseous distention of bowel consistent with small-bowel obstruction or ileus. Electronically Signed   By: Randa Ngo M.D.   On: 06/23/2020 15:21   CT Angio Chest PE W and/or Wo Contrast  Result Date: 06/17/2020 CLINICAL DATA:  Positive D-dimer.  COVID. EXAM: CT ANGIOGRAPHY CHEST WITH CONTRAST TECHNIQUE: Multidetector CT imaging of the chest was performed using the standard protocol during bolus administration of intravenous contrast. Multiplanar CT image reconstructions and MIPs were obtained to evaluate the vascular anatomy. CONTRAST:  38mL OMNIPAQUE IOHEXOL 350 MG/ML SOLN COMPARISON:  07/05/2019 FINDINGS: Cardiovascular: No filling defects in the pulmonary arteries to suggest pulmonary emboli. Heart is normal  size. Aortic atherosclerosis. No aneurysm. Coronary artery calcifications. Mediastinum/Nodes: No mediastinal, hilar, or axillary adenopathy. Trachea and esophagus are unremarkable. Thyroid unremarkable. Lungs/Pleura: No confluent opacities or effusions. Upper Abdomen: Severe right hydronephrosis, slightly worsened from abdominal CT 07/05/2019. Musculoskeletal: Chest wall soft tissues are unremarkable. No acute bony abnormality. Review of the MIP images confirms the above findings. IMPRESSION: No acute bony abnormality. Postoperative changes in the thoracolumbar spine. No evidence of pulmonary embolus. Coronary artery disease. Severe right hydronephrosis, worsening since October 2020. Electronically Signed   By: Rolm Baptise M.D.   On: 06/17/2020 07:38   US RENAL  Result Date: 06/22/2020 CLINICAL DATA:  Inpatient.  Acute kidney injury.  Urinary debris. EXAM: RENAL / URINARY TRACT ULTRASOUND COMPLETE COMPARISON:  06/20/2020 renal sonogram. FINDINGS: Right Kidney: Renal measurements: 12.7 x 6.9 x 7.8 cm = volume: 361 mL. Echogenic right renal parenchyma, normal thickness. Proximal portion of right nephroureteral stent seen in the upper right renal collecting system. No right hydronephrosis. No right renal mass. Left Kidney: Renal measurements: 11.2 x 5.7 x 7.8 cm = volume: 177 mL. Echogenic left renal parenchyma, normal thickness. No left hydronephrosis. No left renal mass. Bladder: Bladder partially collapsed by indwelling Foley catheter. Other: Small amount of free fluid in Morison's pouch and in the left perinephric space. IMPRESSION: 1. No hydronephrosis. 2. Echogenic kidneys, compatible with nonspecific acute renal parenchymal disease. 3. Small amount of free fluid in Morison's pouch and in the left perinephric space. Electronically Signed   By: Ilona Sorrel M.D.   On: 06/22/2020 10:55   US RENAL  Result Date: 06/20/2020 CLINICAL DATA:  Acute renal insufficiency. EXAM: RENAL / URINARY TRACT ULTRASOUND  COMPLETE  COMPARISON:  None. FINDINGS: Right Kidney: Renal measurements: 13.8 cm x 6.4 cm x 7.6 cm = volume: 350 mL. Diffusely increased echogenicity of the renal parenchyma is seen. No mass or hydronephrosis visualized. Left Kidney: Renal measurements: 12.0 cm x 5.5 cm x 5.4 cm = volume: 188 mL. Diffusely increased echogenicity of the renal parenchyma is seen. No mass or hydronephrosis visualized. Bladder: A Foley catheter is in place. Other: None. IMPRESSION: 1. Diffusely increased echogenicity of both kidneys, which may be secondary to medical renal disease. Electronically Signed   By: Virgina Norfolk M.D.   On: 06/20/2020 19:33   DG Chest Portable 1 View  Result Date: 06/17/2020 CLINICAL DATA:  COVID with shortness of breath EXAM: PORTABLE CHEST 1 VIEW COMPARISON:  07/08/2019 FINDINGS: Normal heart size and mediastinal contours. No acute infiltrate or edema. No effusion or pneumothorax. No acute osseous findings. Artifact from EKG leads. Thoracolumbar posterior fusion hardware. IMPRESSION: Negative for pneumonia. Electronically Signed   By: Monte Fantasia M.D.   On: 06/17/2020 04:42   DG ABD ACUTE 2+V W 1V CHEST  Result Date: 06/26/2020 CLINICAL DATA:  Small bowel obstruction. EXAM: X-RAY ABDOMEN 2 VIEW FINDINGS: The enteric tube tip still terminates in the right upper quadrant, presumably at the distal stomach. Continued small bowel distension. No visible pneumoperitoneum internal ureteral stent on the right. Extensive thoracic to pelvic spine fusion. Atelectatic opacity at the lower lobes. IMPRESSION: Ongoing small bowel obstruction without detected change from yesterday. Electronically Signed   By: Monte Fantasia M.D.   On: 06/26/2020 10:41   DG Abd Portable 1V-Small Bowel Obstruction Protocol-initial, 8 hr delay  Result Date: 06/26/2020 CLINICAL DATA:  Small bowel obstruction.  8 hour delay EXAM: PORTABLE ABDOMEN - 1 VIEW COMPARISON:  06/26/2020 FINDINGS: Enteric tube is present with tip in  the right upper quadrant, likely within the distal stomach. Gaseous distention of multiple small bowel loops consistent with small bowel obstruction. No radiopaque contrast material is demonstrated in the stomach or bowel. A right ureteral stent is present. Postoperative fixation of the lower thoracic and lumbosacral spine. IMPRESSION: Gaseous distention of multiple small bowel loops consistent with small bowel obstruction. No radiopaque contrast material is demonstrated in the stomach or bowel. Electronically Signed   By: Lucienne Capers M.D.   On: 06/26/2020 22:54   DG Abd Portable 2V  Result Date: 06/27/2020 CLINICAL DATA:  Ileus. Abdominal pain and distention. COVID positive. EXAM: X-RAY ABDOMEN 3 VIEWS COMPARISON:  06/26/2020. FINDINGS: NG tube noted in stable position. Right double-J ureteral stent stable position. Persistent distended loops of small bowel noted, improved from prior exam. Colonic gas pattern is unremarkable. No free air identified. Prior thoracolumbar and lumbosacral fusion. IMPRESSION: 1. NG tube and right double-J ureteral stent in stable position. 2. Persistent distended loops of small bowel, improved from prior exam. Colonic gas pattern is unremarkable. Electronically Signed   By: Marcello Moores  Register   On: 06/27/2020 07:34   DG OR UROLOGY CYSTO IMAGE ([place])  Result Date: 06/18/2020 There is no interpretation for this exam.  This order is for images obtained during a surgical procedure.  Please See "Surgeries" Tab for more information regarding the procedure.   ECHOCARDIOGRAM COMPLETE  Result Date: 06/19/2020    ECHOCARDIOGRAM REPORT   Patient Name:   Stephanie [name] Date of Exam: 06/19/2020 Medical Rec #:  [id number]     Height:       66.0 in Accession #:    [id number]    Weight:  177.2 lb Date of Birth:  1941/06/26      BSA:          1.900 m Patient Age:    43 years      BP:           125/80 mmHg Patient Gender: F             HR:           153 bpm. Exam Location:  [facility]  Procedure: 2D Echo, Cardiac Doppler and Color Doppler Indications:     Atrial Fibrillation 427.31 / I48.91  History:         Patient has no prior history of Echocardiogram examinations.                  Risk Factors:Hypertension and Diabetes.  Sonographer:     Alyse Low Roar Referring Phys:  [place] Diagnosing Phys: Kate Sable MD IMPRESSIONS  1. Left ventricular ejection fraction, by estimation, is 50 to 55%. The left ventricle has low normal function. The left ventricle has no regional wall motion abnormalities. There is mild left ventricular hypertrophy. Indeterminate diastolic filling due  to E-A fusion.  2. Right ventricular systolic function is normal. The right ventricular size is normal.  3. The mitral valve is normal in structure. No evidence of mitral valve regurgitation. No evidence of mitral stenosis.  4. The aortic valve is normal in structure. Aortic valve regurgitation is not visualized. No aortic stenosis is present. FINDINGS  Left Ventricle: Left ventricular ejection fraction, by estimation, is 50 to 55%. The left ventricle has low normal function. The left ventricle has no regional wall motion abnormalities. The left ventricular internal cavity size was normal in size. There is mild left ventricular hypertrophy. Indeterminate diastolic filling due to E-A fusion. Right Ventricle: The right ventricular size is normal. No increase in right ventricular wall thickness. Right ventricular systolic function is normal. Left Atrium: Left atrial size was normal in size. Right Atrium: Right atrial size was normal in size. Pericardium: There is no evidence of pericardial effusion. Mitral Valve: The mitral valve is normal in structure. No evidence of mitral valve regurgitation. No evidence of mitral valve stenosis. Tricuspid Valve: The tricuspid valve is grossly normal. Tricuspid valve regurgitation is not demonstrated. No evidence of tricuspid stenosis. Aortic Valve: The aortic valve is normal in  structure. Aortic valve regurgitation is not visualized. No aortic stenosis is present. Aortic valve peak gradient measures 2.8 mmHg. Pulmonic Valve: The pulmonic valve was not well visualized. Pulmonic valve regurgitation is not visualized. No evidence of pulmonic stenosis. Aorta: The aortic root is normal in size and structure. Venous: The inferior vena cava was not well visualized. IAS/Shunts: No atrial level shunt detected by color flow Doppler.  LEFT VENTRICLE PLAX 2D LVIDd:         4.34 cm  Diastology LVIDs:         3.24 cm  LV e' medial:    8.05 cm/s LV PW:         1.11 cm  LV E/e' medial:  12.1 LV IVS:        1.38 cm  LV e' lateral:   10.10 cm/s LVOT diam:     1.80 cm  LV E/e' lateral: 9.6 LVOT Area:     2.54 cm  RIGHT VENTRICLE RV Mid diam:    2.70 cm RV S prime:     19.00 cm/s TAPSE (M-mode): 1.6 cm LEFT ATRIUM  Index       RIGHT ATRIUM           Index LA diam:      3.40 cm 1.79 cm/m  RA Area:     12.60 cm LA Vol (A4C): 34.8 ml 18.32 ml/m RA Volume:   27.80 ml  14.63 ml/m  AORTIC VALVE               PULMONIC VALVE AV Area (Vmax): 2.66 cm   PV Vmax:        0.96 m/s AV Vmax:        84.20 cm/s PV Peak grad:   3.7 mmHg AV Peak Grad:   2.8 mmHg   RVOT Peak grad: 4 mmHg LVOT Vmax:      88.10 cm/s  AORTA Ao Root diam: 3.30 cm MITRAL VALVE               TRICUSPID VALVE MV Area (PHT): 3.60 cm    TR Peak grad:   17.0 mmHg MV Decel Time: 211 msec    TR Vmax:        206.00 cm/s MV E velocity: 97.30 cm/s                            SHUNTS                            Systemic Diam: 1.80 cm Kate Sable MD Electronically signed by Kate Sable MD Signature Date/Time: 06/19/2020/4:20:09 PM    Final    CT RENAL STONE STUDY  Result Date: 06/17/2020 CLINICAL DATA:  Hydronephrosis, acute kidney injury EXAM: CT ABDOMEN AND PELVIS WITHOUT CONTRAST TECHNIQUE: Multidetector CT imaging of the abdomen and pelvis was performed following the standard protocol without IV contrast. COMPARISON:  CTA of the  chest obtained earlier today; prior CT scan of the abdomen and pelvis 07/05/2019 FINDINGS: Lower chest: No acute abnormality. Small hiatal hernia. Atherosclerotic calcifications visualized along the coronary arteries. The heart is normal in size. Hepatobiliary: Relatively low attenuation of the hepatic parenchyma consistent with steatosis. No discrete hepatic lesion. High attenuation material within the gallbladder may represent sludge and/or small stones. Pancreas: Unremarkable. No pancreatic ductal dilatation or surrounding inflammatory changes. Spleen: Normal in size without focal abnormality. Adrenals/Urinary Tract: Normal adrenal glands. Nonspecific perinephric stranding bilaterally. Moderate right-sided hydronephrosis terminating at the ureteropelvic junction is slightly progressed compared to prior imaging from October of 2020. Filling defects are present within the dependent portion of the renal collecting system. The ureter itself appears patent to the bladder. The bladder is distended and contains a large amount of air. Additionally, there is some debris layering dependently. Striated nephrogram throughout the right kidney favored to represent delayed nephrogram secondary to UPJ obstruction. Bilateral circumscribed low-attenuation renal lesions demonstrate no significant interval change in most likely represent simple and minimally complex renal cysts. Stomach/Bowel: No acute abnormality. Moderate rectal stool burden suggests constipation. Evidence of prior colonic and enteric surgery. No obstruction. Vascular/Lymphatic: Limited evaluation in the absence of intravenous contrast. Atherosclerotic vascular calcifications throughout the abdominal aorta and branch arteries. No suspicious lymphadenopathy. Reproductive: No acute abnormality. Other: No abdominal wall hernia or abnormality. No abdominopelvic ascites. Musculoskeletal: Advanced thoracolumbosacral posterior interbody fusion with multilevel screw and  rod construct. No evidence of acute abnormality. IMPRESSION: 1. 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 2. Debris present dependently within the right renal collecting system and  bladder. This may represent thrombus in the setting of hematuria, mucinous debris, or purulent debris if the patient has evidence of an active urinary tract infection. Ultimately, cystoscopy should be considered after resolution of the patient's acute symptoms to exclude the less likely possibility of a bladder wall mass. 3. Bladder distension with air/gas floating in the fundus. This may be secondary to recent instrumentation (catheter insertion), or be reflective of infection with a gas-forming organism. Recommend correlation with urinalysis. 4. Nonspecific perinephric stranding bilaterally consistent with the history of acute kidney injury. 5. Moderate rectal stool burden consistent with constipation. 6. Small hiatal hernia. 7. Aortic and cardiac atherosclerotic vascular calcifications. Electronically Signed   By: Jacqulynn Cadet M.D.   On: 06/17/2020 11:04   Assessment/Plan 1.  Acute on chronic renal failure: Will proceed with temporary dialysis catheter placement at this time.  Risks and benefits discussed with patient and/or family, and the catheter will be placed to allow immediate initiation of dialysis.  If the patient's renal function does not improve throughout the hospital course, we will be happy to place a tunneled dialysis catheter for long term use prior to discharge.  2. COVID 19: Patient was fully vaccinated Initial PCR test: 06/09/20 Remdesivir completed this admisison Patient is on isolation until June 29, 2020  3. SBO: NGT sumping. Bilious drainage Followed by general surgery  General Surgery has asked that we also place a central line for TPN     Discussed with Dr. Francene Castle, PA-C  06/27/2020 1:10 PM

## 2020-06-27 NOTE — Consult Note (Addendum)
[facility] for Heparin Drip Indication:  atrial fibrillation (worsening renal function, stopping eliquis, also with ileus)  Allergies  Allergen Reactions  . Ibuprofen Itching  . Indomethacin   . Infliximab   . Methotrexate Derivatives   . Moexipril   . Percocet [Oxycodone-Acetaminophen] Itching  . Naprosyn [Naproxen] Rash    Patient Measurements: Height: 5\' 6"  (167.6 cm) Weight: 86.8 kg (191 lb 6.4 oz) IBW/kg (Calculated) : 59.3 Heparin Dosing Weight: 75.9kg  Vital Signs: Temp: 97.5 F (36.4 C) (09/27 0521) Temp Source: Oral (09/27 0521) BP: 155/62 (09/27 0521) Pulse Rate: 76 (09/27 0521)  Labs: Recent Labs    06/25/20 0731 06/25/20 1800 06/26/20 0510 06/26/20 2043 06/27/20 0545  HGB 9.4*  --  8.9*  --   --   HCT 25.4*  --  24.0*  --   --   PLT 184  --  184  --   --   APTT 111*   < > 57* 102* 101*  LABPROT 22.8*  --  20.2*  --  18.6*  INR 2.1*  --  1.8*  --  1.6*  HEPARINUNFRC 2.04*  --  1.70*  --  1.08*  CREATININE 5.96*  --  6.14*  --   --    < > = values in this interval not displayed.    Estimated Creatinine Clearance: 8.5 mL/min (A) (by C-G formula based on SCr of 6.14 mg/dL (H)).   Medical History: Past Medical History:  Diagnosis Date  . Arthritis   . Asthma   . Cancer ([place]) 1996   colon  . Diabetes mellitus without complication ([place])   . GERD (gastroesophageal reflux disease)   . Glaucoma   . Hyperlipidemia   . Hypertension   . Lumbar disc disease   . Small bowel obstruction ([place])   . Vitamin B 12 deficiency     Medications:  Last dose Eliquis as inpatient 9/24 0907  Assessment: 79 y.o.femalewith history of hypertension, diabetes, hyperlipidemia, rheumatoid arthritis, chronic urinary retention requiring self-catheterization presenting with fevers chills, diagnosed with sepsis likely from urinary origin, underwent right renal stent placement due to obstruction.   Patient being seen for new onset  atrial fibrillation with rapid ventricular response.  Now with possible ileus and worsening Scr, will dc Eliquis and start heparin per pharmacy consult.  9/25 0731 aPTT 111 sec, HL 2.04 9/25 1800 aPTT 67 sec, increased to 650 units/hr 9/26 0510 aPTT 57 sec, increased to 750 units/hr 9/26 2043 aPTT 102 sec, therapeutic x 1 9/27 0543 aPTT 101 sec, therapeutic x 2  Goal of Therapy:  aPTT 66-102 seconds Monitor platelets by anticoagulation protocol: Yes   Plan:  9/27 0543 APTT 101 sec = therapeutic, HL 1.08 - levels do not correlate - will recheck heparin level, APTT, and CBC with am labs.   Lu Duffel, PharmD, BCPS Clinical Pharmacist 06/27/2020 7:24 AM

## 2020-06-27 NOTE — Consult Note (Signed)
Consultation Note Date: 06/27/2020   Patient Name: Stephanie [name]  DOB: 08/27/1941  MRN: [id number]  Age / Sex: 79 y.o., female  PCP: Stephanie Aus, MD Referring Physician: Fritzi Mandes, MD  Reason for Consultation: Establishing goals of care and Psychosocial/spiritual support  HPI/Patient Profile: 79 y.o. female  with past medical history of COVID-19 viral infection (positive PCR test June 09, 2020), DM, HTN/HLD, RA - chronic immunosupp, colon cancer 1996, arthritis, asthma admitted on 06/17/2020 with SBO with NGT to sx, sepsis 2/2 Klebsiella bacteremia, R pyelonephritis with worsening kidney function and new onset Afib.   Clinical Assessment and Goals of Care: Stephanie [name] is resting quietly in bed.  She greets me, making and keeping eye contact.  She appears somewhat frail, but is alert and oriented X3, able to make her needs known.  She is COVID + and therefore no family  Is present at this time.  Somewhat limited conversation dt negative pressure devices in room.     We talk about her treatment plan, and she tells me that she will accept short term dialysis and understands that she will have catheter placement today.  We briefly talk about what HD will be like.  I encourage her that staff will monitor her closely during her first few treatments.   We talk about her acute illness and getting stronger.  Stephanie [name] agrees to [facility], bed offered and accepted at [facility].   We talk about HCPOA, see below.  We talk about code status, see below.   PMT to continue to follow.  Would benefit from out patient palliative to follow.    HCPOA   NEXT OF KIN - Stephanie [name] names her husband, Stephanie [name], as her healthcare surrogate     SUMMARY OF RECOMMENDATIONS   Continue to treat the treatable, but no CPR or intubation Agreeable to short term HD   Code Status/Advance Care Planning:  DNR  Symptom  Management:   Per hospitalist, no additional needs at this time.   Palliative Prophylaxis:   Frequent Pain Assessment, Oral Care and Turn Reposition  Additional Recommendations (Limitations, Scope, Preferences):  continue to treat the treatable, agreeable to ST HD.   Psycho-social/Spiritual:   Desire for further Chaplaincy support:no  Additional Recommendations: Caregiving  Support/Resources  Prognosis:   Unable to determine, based on outcomes, 6 months or less would not be surprising based on acute illness and chronic disease burden.   Discharge Planning: to be determined, based on outcomes       Primary Diagnoses: Present on Admission: . Sepsis ([place]) . Benign essential hypertension . Chronic cystitis . Rheumatoid arthritis ([place]) . (Resolved) CKD stage 3 due to type 2 diabetes mellitus ([place]) . COVID-19 virus infection . AKI (acute kidney injury) ([place]) . Pure hypercholesterolemia . Rheumatoid arthritis involving multiple sites with positive rheumatoid factor ([place]) . Acute pyelonephritis . Hydronephrosis, right . Lactic acid acidosis . Hyperglycemia . Pressure injury of skin   I have reviewed the medical record, interviewed the patient and family, and  examined the patient. The following aspects are pertinent.  Past Medical History:  Diagnosis Date  . Arthritis   . Asthma   . Cancer ([place]) 1996   colon  . Diabetes mellitus without complication ([place])   . GERD (gastroesophageal reflux disease)   . Glaucoma   . Hyperlipidemia   . Hypertension   . Lumbar disc disease   . Small bowel obstruction ([place])   . Vitamin B 12 deficiency    Social History   Socioeconomic History  . Marital status: Married    Spouse name: Not on file  . Number of children: Not on file  . Years of education: Not on file  . Highest education level: Not on file  Occupational History  . Not on file  Tobacco Use  . Smoking status: Never Smoker  . Smokeless tobacco: Never Used    Vaping Use  . Vaping Use: Never used  Substance and Sexual Activity  . Alcohol use: No  . Drug use: No  . Sexual activity: Yes  Other Topics Concern  . Not on file  Social History Narrative  . Not on file   Social Determinants of Health   Financial Resource Strain:   . Difficulty of Paying Living Expenses: Not on file  Food Insecurity:   . Worried About Charity fundraiser in the Last Year: Not on file  . Ran Out of Food in the Last Year: Not on file  Transportation Needs:   . Lack of Transportation (Medical): Not on file  . Lack of Transportation (Non-Medical): Not on file  Physical Activity:   . Days of Exercise per Week: Not on file  . Minutes of Exercise per Session: Not on file  Stress:   . Feeling of Stress : Not on file  Social Connections:   . Frequency of Communication with Friends and Family: Not on file  . Frequency of Social Gatherings with Friends and Family: Not on file  . Attends Religious Services: Not on file  . Active Member of Clubs or Organizations: Not on file  . Attends Archivist Meetings: Not on file  . Marital Status: Not on file   Family History  Problem Relation Age of Onset  . Breast cancer Neg Hx    Scheduled Meds: . vitamin C  500 mg Oral Daily  . brimonidine  1 drop Both Eyes BID  . chlorhexidine  15 mL Mouth Rinse BID  . Chlorhexidine Gluconate Cloth  6 each Topical Q0600  . hydrALAZINE  10 mg Intravenous Q6H  . insulin aspart  0-9 Units Subcutaneous Q4H  . mouth rinse  15 mL Mouth Rinse BID  . metoprolol tartrate  5 mg Intravenous Q6H  . pantoprazole (PROTONIX) IV  40 mg Intravenous Q24H  . Ensure Max Protein  11 oz Oral TID  . sodium chloride flush  10-40 mL Intracatheter Q12H  . sodium chloride flush  3 mL Intravenous Q12H  . tamsulosin  0.4 mg Oral Daily  . zinc sulfate  220 mg Oral Daily   Continuous Infusions: . sodium chloride 10 mL/hr at 06/26/20 1430  . heparin 750 Units/hr (06/27/20 0300)  . sodium  bicarbonate (isotonic) 150 mEq in D5W 1000 mL infusion 30 mL/hr at 06/27/20 1018   PRN Meds:.sodium chloride, acetaminophen, albuterol, guaiFENesin-dextromethorphan, morphine injection, ondansetron (ZOFRAN) IV, sodium chloride flush Medications Prior to Admission:  Prior to Admission medications   Medication Sig Start Date End Date Taking? Authorizing Provider  amLODipine (NORVASC) 10 MG  tablet Take 5 mg by mouth 2 (two) times daily.    Yes [provider]  brimonidine (ALPHAGAN) 0.2 % ophthalmic solution Place 1 drop into both eyes 2 (two) times daily. 05/31/20  Yes [provider]  Cranberry 400 MG CAPS Take by mouth. 03/24/20  Yes [provider]  estradiol (ESTRACE) 0.1 MG/GM vaginal cream Place 1 Applicatorful vaginally at bedtime.   Yes [provider]  glipiZIDE (GLUCOTROL) 10 MG tablet Take 10 mg by mouth daily before breakfast.   Yes [provider]  hydrochlorothiazide (HYDRODIURIL) 25 MG tablet Take 25 mg by mouth daily.   Yes [provider]  leflunomide (ARAVA) 10 MG tablet Take 10 mg by mouth daily.   Yes [provider]  metFORMIN (GLUCOPHAGE-XR) 500 MG 24 hr tablet Take 500 mg by mouth 2 (two) times daily.   Yes [provider]  metoprolol tartrate (LOPRESSOR) 50 MG tablet Take 50 mg by mouth 2 (two) times daily.   Yes [provider]  omeprazole (PRILOSEC) 40 MG capsule Take 40 mg by mouth daily.   Yes [provider]  potassium chloride SA (K-DUR,KLOR-CON) 20 MEQ tablet Take 20 mEq by mouth daily.   Yes [provider]  Probiotic Product (PROBIOTIC-10 PO) Take 1 Dose by mouth daily.    Yes [provider]  rosuvastatin (CRESTOR) 10 MG tablet Take by mouth. 03/24/20  Yes [provider]  tacrolimus (PROTOPIC) 0.1 % ointment Apply 1 application topically 2 (two) times daily as needed (rash).    Yes [provider]  tamsulosin (FLOMAX) 0.4 MG CAPS capsule TAKE  1 CAPSULE(0.4 MG) BY MOUTH DAILY Patient taking differently: Take 0.4 mg by mouth daily.  05/27/20  Yes McGowan, Larene Beach A, PA-C  vitamin B-12 (CYANOCOBALAMIN) 1000 MCG tablet Take 1,000 mcg by mouth daily.   Yes [provider]  CONTOUR TEST test strip 1 each by Other route daily.  03/09/18   [provider]   Allergies  Allergen Reactions  . Ibuprofen Itching  . Indomethacin   . Infliximab   . Methotrexate Derivatives   . Moexipril   . Percocet [Oxycodone-Acetaminophen] Itching  . Naprosyn [Naproxen] Rash   Review of Systems  Unable to perform ROS: Age    Physical Exam Vitals and nursing note reviewed.  Constitutional:      General: She is not in acute distress.    Appearance: She is ill-appearing.  Cardiovascular:     Rate and Rhythm: Regular rhythm.  Pulmonary:     Effort: Pulmonary effort is normal. No tachypnea.  Skin:    General: Skin is warm and dry.  Neurological:     Mental Status: She is alert and oriented to person, place, and time.  Psychiatric:        Mood and Affect: Mood normal.        Behavior: Behavior normal.     Vital Signs: BP (!) 152/52 (BP Location: Left Leg)   Pulse 81   Temp 97.6 F (36.4 C) (Oral)   Resp 18   Ht 5\' 6"  (1.676 m)   Wt 86.8 kg   SpO2 95%   BMI 30.89 kg/m  Pain Scale: 0-10   Pain Score: 0-No pain   SpO2: SpO2: 95 % O2 Device:SpO2: 95 % O2 Flow Rate: .O2 Flow Rate (L/min): 2 L/min  IO: Intake/output summary:   Intake/Output Summary (Last 24 hours) at 06/27/2020 1501 Last data filed at 06/27/2020 0346 Gross per 24 hour  Intake 1890.65  ml  Output 2050 ml  Net -159.35 ml    LBM: Last BM Date: 06/26/20 Baseline Weight: Weight: 75.3 kg Most recent weight: Weight: 86.8 kg     Palliative Assessment/Data:   Flowsheet Rows     Most Recent Value  Intake Tab  Referral Department Hospitalist  Unit at Time of Referral Cardiac/Telemetry Unit  Palliative Care Primary Diagnosis Pulmonary  Date Notified  06/26/20  Palliative Care Type New Palliative care  Reason for referral Clarify Goals of Care  Date of Admission 06/17/20  Date first seen by Palliative Care 06/27/20  # of days Palliative referral response time 1 Day(s)  # of days IP prior to Palliative referral 9  Clinical Assessment  Palliative Performance Scale Score 40%  Pain Max last 24 hours Not able to report  Pain Min Last 24 hours Not able to report  Dyspnea Max Last 24 Hours Not able to report  Dyspnea Min Last 24 hours Not able to report  Psychosocial & Spiritual Assessment  Palliative Care Outcomes      Time In: 1330 Time Out: 1420 Time Total: 50 minutes  Greater than 50%  of this time was spent counseling and coordinating care related to the above assessment and plan.  Signed by: Drue Novel, NP   Please contact Palliative Medicine Team phone at [phone] for questions and concerns.  For individual provider: See Shea Evans

## 2020-06-27 NOTE — Progress Notes (Signed)
[facility] [place] NAME: Laurina Fischl    MR#:  [id number]  DATE OF BIRTH:  July 09, 1941  SUBJECTIVE:  patient NG output 1000cc on 9/26  Not passing gas No fever. No respiratory distress. Complains of weakness. Decreased UOP with elevated creat REVIEW OF SYSTEMS:   Review of Systems  Constitutional: Negative for chills, fever and weight loss.  HENT: Negative for ear discharge, ear pain and nosebleeds.   Eyes: Negative for blurred vision, pain and discharge.  Respiratory: Negative for sputum production, shortness of breath, wheezing and stridor.   Cardiovascular: Negative for chest pain, palpitations, orthopnea and PND.  Gastrointestinal: Negative for abdominal pain, diarrhea, nausea and vomiting.  Genitourinary: Negative for frequency and urgency.  Musculoskeletal: Positive for joint pain. Negative for back pain.  Neurological: Positive for weakness. Negative for sensory change, speech change and focal weakness.  Psychiatric/Behavioral: Negative for depression and hallucinations. The patient is not nervous/anxious.    Tolerating Diet:NPO , NG+ Tolerating PT: not able to participate since patient is on NG suction  DRUG ALLERGIES:   Allergies  Allergen Reactions   Ibuprofen Itching   Indomethacin    Infliximab    Methotrexate Derivatives    Moexipril    Percocet [Oxycodone-Acetaminophen] Itching   Naprosyn [Naproxen] Rash    VITALS:  Blood pressure (!) 140/56, pulse 76, temperature (!) 97.5 F (36.4 C), resp. rate 15, height 5\' 6"  (1.676 m), weight 86.8 kg, SpO2 96 %.  PHYSICAL EXAMINATION:   Physical Exam  GENERAL:  79 y.o.-year-old patient lying in the bed with no acute distress.  Chronically ill HEENT: Head atraumatic, normocephalic. Oropharynx and nasopharynx clear. NG + LUNGS: Normal breath sounds bilaterally, no wheezing, rales, rhonchi. No use of accessory muscles of respiration.  CARDIOVASCULAR: S1, S2 normal. No  murmurs, rubs, or gallops.  ABDOMEN: Soft, nontender,  +distended. Few Bowel sounds present. No organomegaly or mass.  EXTREMITIES: No cyanosis, clubbing  + ankle edema b/l.    NEUROLOGIC: grossly nonfocal.deconditioned PSYCHIATRIC:  patient is alert and oriented times three SKIN: Pressure Injury 06/18/20 Buttocks Left;Lower Stage 1 -  Intact skin with non-blanchable redness of a localized area usually over a bony prominence. (Active)  06/18/20 0600  Location: Buttocks  Location Orientation: Left;Lower  Staging: Stage 1 -  Intact skin with non-blanchable redness of a localized area usually over a bony prominence.  Wound Description (Comments):   Present on Admission: Yes   LABORATORY PANEL:  CBC Recent Labs  Lab 06/27/20 0545  WBC 5.3  HGB 9.2*  HCT 24.6*  PLT 157    Chemistries  Recent Labs  Lab 06/25/20 0731 06/25/20 0731 06/26/20 0510  NA 124*   < > 127*  K 4.0   < > 3.4*  CL 95*   < > 90*  CO2 14*   < > 20*  GLUCOSE 179*   < > 186*  BUN 94*   < > 98*  CREATININE 5.96*   < > 6.14*  CALCIUM 6.5*   < > 6.4*  MG 1.9  --   --   AST  --   --  18  ALT  --   --  14  ALKPHOS  --   --  179*  BILITOT  --   --  0.9   < > = values in this interval not displayed.   Cardiac Enzymes No results for input(s): TROPONINI in the last 168 hours. RADIOLOGY:  DG ABD ACUTE 2+V  W 1V CHEST  Result Date: 06/26/2020 CLINICAL DATA:  Small bowel obstruction. EXAM: X-RAY ABDOMEN 2 VIEW FINDINGS: The enteric tube tip still terminates in the right upper quadrant, presumably at the distal stomach. Continued small bowel distension. No visible pneumoperitoneum internal ureteral stent on the right. Extensive thoracic to pelvic spine fusion. Atelectatic opacity at the lower lobes. IMPRESSION: Ongoing small bowel obstruction without detected change from yesterday. Electronically Signed   By: Monte Fantasia M.D.   On: 06/26/2020 10:41   DG Abd Portable 1V-Small Bowel Obstruction Protocol-initial, 8  hr delay  Result Date: 06/26/2020 CLINICAL DATA:  Small bowel obstruction.  8 hour delay EXAM: PORTABLE ABDOMEN - 1 VIEW COMPARISON:  06/26/2020 FINDINGS: Enteric tube is present with tip in the right upper quadrant, likely within the distal stomach. Gaseous distention of multiple small bowel loops consistent with small bowel obstruction. No radiopaque contrast material is demonstrated in the stomach or bowel. A right ureteral stent is present. Postoperative fixation of the lower thoracic and lumbosacral spine. IMPRESSION: Gaseous distention of multiple small bowel loops consistent with small bowel obstruction. No radiopaque contrast material is demonstrated in the stomach or bowel. Electronically Signed   By: Lucienne Capers M.D.   On: 06/26/2020 22:54   DG Abd Portable 2V  Result Date: 06/27/2020 CLINICAL DATA:  Ileus. Abdominal pain and distention. COVID positive. EXAM: X-RAY ABDOMEN 3 VIEWS COMPARISON:  06/26/2020. FINDINGS: NG tube noted in stable position. Right double-J ureteral stent stable position. Persistent distended loops of small bowel noted, improved from prior exam. Colonic gas pattern is unremarkable. No free air identified. Prior thoracolumbar and lumbosacral fusion. IMPRESSION: 1. NG tube and right double-J ureteral stent in stable position. 2. Persistent distended loops of small bowel, improved from prior exam. Colonic gas pattern is unremarkable. Electronically Signed   By: Marcello Moores  Register   On: 06/27/2020 07:34   ASSESSMENT AND PLAN:  TASHEIKA KITZMILLER a 79 y.o.femalewith medical history significant forCOVID-19 viral infection(positive PCR test June 09, 2020),history of diabetes mellitus, history of rheumatoid arthritis on chronic immunosuppressive therapy, hypertension and a remote history of colon cancer who was brought into the ER by EMS for evaluation of fever. Patient had a T-max of 104Fat home.She complains of generalized weakness and worsening shortness of breath  but denies having any nausea, no vomiting, no chest pain or abdominal pain. Patient does self caths at home. Patient is fully vaccinated She was started on prednisone by her primary care provider for presumed COVID-19 bronchitis   # SBO -- No N/V.  KUB showed SBO. --CT abd with oral contrast showed "High-grade small-bowel obstruction with an abrupt transition point in the central lower abdomen. Favor Adhesions." --GenSurg consult  With Dr Dahlia Byes appreciated, rec NG tube for decompression--trying to Clamp NG -9/26--ABd xray--not much improvement, NG out put 1000cc  # Sepsis 2/2 Klebsiella bacteremia with AKI/ATN # right pyelonephritis  --As evidenced by fever with a T-max of103F,tachycardia, tachypnea, leukocytosis of 20,000 with a left shift and lactic acid of 3.1.  UA positive for infection.  -9/26--complted 10 days of IV abxs -repeat blood culture from 19th September negative -Patient afebrile. White count stable  -recent CT abdomen no abdominal source of infection. --Urine grew pan-sensitive Klebsiella. --On admission--> blood cx 2/2 positive for Klebsiella, --9/21--repeat blood cx obtained  neg growth so far.  # Severe right hydronephrosis --CT renal showed "Progressive right UPJ obstruction compared to prior October of 2020." --right stent placement by urology 9/18 (found frank pus in the kidney) --  US renal 9/20 showed resolved hydronephrosis --continue Foley per urology Dr Jeffie Pollock (note 06/19/2020) --strict I/O  # New onset Afib w RVR, persistent # Acquired thrombophilia  --developed while in the ED.  HR 120's-140's despite multiple doses of IV dilt ---started heparin gtt 9/18, transitioned to Eliquis  -9/25--off IV diltiazem--HR 64's -[facility] -cardiology consulted -At home on regimen of lopressor 200 mg BID and Cardizem 120 mg daily --switch to heparin gtt for anticoagulation due to SBO--hoping to resume eliquis once NG is clamped -- will switch to IV metoprolol --HR  stable  # Hematuria --noted on 9/23.  Hgb stable.  Pt needs to be on anticoagulation due to Afib. --Monitor Hgb  # COVID-19 infection --Pt is fully vaccinated.  Patient'sCOVID-19 PCR test was positive on 06/09/20.  No overt respiratory symptoms.  CT chest clear.  No real O2 requirement on presentation.  Currently on room air. --Remdesivir completed this admisison --according to infection control, due to pt's immunosuppressed status PTA, pt needs the 21 day isolation till 06/29/2020   AKIATN in the setting of sepsis, worsening Metabolic acidosis, uncompensated --on admission creatinine is 1.5.  Baseline Cr 0.8.  Acute renal failure secondary to ATN with sepsis, obstructive uropathy, IV contrast nephropathy and prolonged prerenal azotemia. --US renal showed resolved hydro and medical renal disease --nephrology consulted --bicarb 33. ABG looks better today (9/26) --start sodium bicarb D5 infusion @100  ml/hr, per nephrology--now d/ced IVF -Creat 6.14. Dr Theador Hawthorne has spoken with patient and husband regarding starting hemodialysis. --9/27 -- vascular consult for temporary catheter placement.  Lactic acidosis 2/2 sepsis --resolved with IVF.  Hx Diabetes mellitus  hyperglycemia related to systemic steroid administration Patient was started on prednisone as an outpatient by her primary care provider for presumed COVID-19 bronchitis Chest x-ray is negative for pneumonia and patient is not hypoxic Prednisone d/c'ed. PLAN: --SSI TID  Hx of HTN--uncontrolled --BP drops sometimes from a lot of rate control agents --BP wnl now --IV hydralazine 10 mg qid and Metoprolol IV 5 mg qid   History of rheumatoid arthritis --continue to hold Arava due to sepsis  Hypokalemia and hypomag, not POA --monitor and replete PRN  HLD --continue home statin  Hypocalcemia -Calcium 6.4 today, corrected Calcium calculated at 7.1 -IV calcium gluconate x1  Anemia with renal failure: Hgb  stable in 10's --Monitor   Hypoalbuminemia  --albumin 2.7 --received couple days of IV albumin, per nephrology--now d/ced per Dr Theador Hawthorne  patient has multiple comorbidities. Overall has poor long-term prognosis. Palliative care consultation.  DVT prophylaxis: [id number] gtt Code Status: DNR  Family Communication: husband updated on the phone today by me Status is: inpatient Dispo:   The patient is from: home Anticipated d/c is to: home Anticipated d/c date is: >3 days Patient currently is not medically stable to d/c due to: worsening AKI, new SBO, on NG tube for decompression. Needs To be started on hemodialysis    TOTAL TIME TAKING CARE OF THIS PATIENT: *25* minutes.  >50% time spent on counselling and coordination of care  Note: This dictation was prepared with Dragon dictation along with smaller phrase technology. Any transcriptional errors that result from this process are unintentional.  Fritzi Mandes M.D    [facility]   CC: Primary care physician; Rusty Aus, MDPatient ID: [name], female   DOB: 1941/05/07, 80 y.o.   MRN: [id number]

## 2020-06-28 ENCOUNTER — Encounter: Payer: Self-pay | Admitting: Vascular Surgery

## 2020-06-28 DIAGNOSIS — A419 Sepsis, unspecified organism: Secondary | ICD-10-CM | POA: Diagnosis not present

## 2020-06-28 DIAGNOSIS — K565 Intestinal adhesions [bands], unspecified as to partial versus complete obstruction: Secondary | ICD-10-CM

## 2020-06-28 DIAGNOSIS — I10 Essential (primary) hypertension: Secondary | ICD-10-CM | POA: Diagnosis not present

## 2020-06-28 DIAGNOSIS — N185 Chronic kidney disease, stage 5: Secondary | ICD-10-CM

## 2020-06-28 DIAGNOSIS — I4891 Unspecified atrial fibrillation: Secondary | ICD-10-CM | POA: Diagnosis not present

## 2020-06-28 DIAGNOSIS — K567 Ileus, unspecified: Secondary | ICD-10-CM

## 2020-06-28 DIAGNOSIS — I480 Paroxysmal atrial fibrillation: Secondary | ICD-10-CM

## 2020-06-28 LAB — BASIC METABOLIC PANEL
Anion gap: 19 — ABNORMAL HIGH (ref 5–15)
BUN: 91 mg/dL — ABNORMAL HIGH (ref 8–23)
CO2: 27 mmol/L (ref 22–32)
Calcium: 6.5 mg/dL — ABNORMAL LOW (ref 8.9–10.3)
Chloride: 87 mmol/L — ABNORMAL LOW (ref 98–111)
Creatinine, Ser: 6.33 mg/dL — ABNORMAL HIGH (ref 0.44–1.00)
GFR calc Af Amer: 7 mL/min — ABNORMAL LOW (ref >60)
GFR calc non Af Amer: 6 mL/min — ABNORMAL LOW (ref >60)
Glucose, Bld: 131 mg/dL — ABNORMAL HIGH (ref 70–99)
Potassium: 2.5 mmol/L — CL (ref 3.5–5.1)
Sodium: 133 mmol/L — ABNORMAL LOW (ref 135–145)

## 2020-06-28 LAB — CBC
HCT: 24.7 % — ABNORMAL LOW (ref 36.0–46.0)
Hemoglobin: 8.8 g/dL — ABNORMAL LOW (ref 12.0–15.0)
MCH: 27.2 pg (ref 26.0–34.0)
MCHC: 35.6 g/dL (ref 30.0–36.0)
MCV: 76.5 fL — ABNORMAL LOW (ref 80.0–100.0)
Platelets: 155 10*3/uL (ref 150–400)
RBC: 3.23 MIL/uL — ABNORMAL LOW (ref 3.87–5.11)
RDW: 15.4 % (ref 11.5–15.5)
WBC: 6.2 10*3/uL (ref 4.0–10.5)
nRBC: 0 % (ref 0.0–0.2)

## 2020-06-28 LAB — GLUCOSE, CAPILLARY
Glucose-Capillary: 126 mg/dL — ABNORMAL HIGH (ref 70–99)
Glucose-Capillary: 145 mg/dL — ABNORMAL HIGH (ref 70–99)
Glucose-Capillary: 133 mg/dL — ABNORMAL HIGH (ref 70–99)
Glucose-Capillary: 144 mg/dL — ABNORMAL HIGH (ref 70–99)
Glucose-Capillary: 137 mg/dL — ABNORMAL HIGH (ref 70–99)

## 2020-06-28 LAB — HEPARIN LEVEL (UNFRACTIONATED): Heparin Unfractionated: 0.49 IU/mL (ref 0.30–0.70)

## 2020-06-28 LAB — APTT: aPTT: 82 seconds — ABNORMAL HIGH (ref 24–36)

## 2020-06-28 LAB — HEPATITIS B CORE ANTIBODY, TOTAL: Hep B Core Total Ab: NONREACTIVE

## 2020-06-28 LAB — POTASSIUM: Potassium: 3 mmol/L — ABNORMAL LOW (ref 3.5–5.1)

## 2020-06-28 LAB — MAGNESIUM: Magnesium: 1.8 mg/dL (ref 1.7–2.4)

## 2020-06-28 LAB — PHOSPHORUS: Phosphorus: 9.3 mg/dL — ABNORMAL HIGH (ref 2.5–4.6)

## 2020-06-28 LAB — HEPATITIS B SURFACE ANTIGEN: Hepatitis B Surface Ag: NONREACTIVE

## 2020-06-28 MED ORDER — POTASSIUM CHLORIDE 10 MEQ/100ML IV SOLN
10.0000 meq | INTRAVENOUS | Status: AC
  Administered 2020-06-28 (×4): 10 meq via INTRAVENOUS
  Filled 2020-06-28 (×4): qty 100

## 2020-06-28 MED ORDER — HEPARIN SODIUM (PORCINE) 1000 UNIT/ML DIALYSIS
20.0000 [IU]/kg | INTRAMUSCULAR | Status: DC | PRN
  Filled 2020-06-28: qty 2

## 2020-06-28 MED ORDER — TRACE MINERALS CU-MN-SE-ZN 300-55-60-3000 MCG/ML IV SOLN
INTRAVENOUS | Status: AC
  Filled 2020-06-28: qty 211.2

## 2020-06-28 NOTE — Consult Note (Signed)
[facility] [place] for Heparin Drip Indication:  atrial fibrillation (worsening renal function, stopping eliquis, also with ileus)  Allergies  Allergen Reactions  . Ibuprofen Itching  . Indomethacin   . Infliximab   . Methotrexate Derivatives   . Moexipril   . Percocet [Oxycodone-Acetaminophen] Itching  . Naprosyn [Naproxen] Rash    Patient Measurements: Height: 5\' 6"  (167.6 cm) Weight: 86.8 kg (191 lb 6.4 oz) IBW/kg (Calculated) : 59.3 Heparin Dosing Weight: 75.9kg  Vital Signs: Temp: 97.7 F (36.5 C) (09/28 0323) Temp Source: Oral (09/28 0323) BP: 106/49 (09/28 0323) Pulse Rate: 106 (09/28 0323)  Labs: Recent Labs    06/25/20 0731 06/25/20 1800 06/26/20 0510 06/26/20 0510 06/26/20 2043 06/27/20 0545 06/28/20 0530  HGB 9.4*  --  8.9*  --   --  9.2*  --   HCT 25.4*  --  24.0*  --   --  24.6*  --   PLT 184  --  184  --   --  157  --   APTT 111*   < > 57*   < > 102* 101* 82*  LABPROT 22.8*  --  20.2*  --   --  18.6*  --   INR 2.1*  --  1.8*  --   --  1.6*  --   HEPARINUNFRC 2.04*  --  1.70*  --   --  1.08* 0.49  CREATININE 5.96*  --  6.14*  --   --   --   --    < > = values in this interval not displayed.    Estimated Creatinine Clearance: 8.5 mL/min (A) (by C-G formula based on SCr of 6.14 mg/dL (H)).   Medical History: Past Medical History:  Diagnosis Date  . Arthritis   . Asthma   . Cancer ([place]) 1996   colon  . Diabetes mellitus without complication ([place])   . GERD (gastroesophageal reflux disease)   . Glaucoma   . Hyperlipidemia   . Hypertension   . Lumbar disc disease   . Small bowel obstruction ([place])   . Vitamin B 12 deficiency     Medications:  Last dose Eliquis as inpatient 9/24 0907  Assessment: 79 y.o.femalewith history of hypertension, diabetes, hyperlipidemia, rheumatoid arthritis, chronic urinary retention requiring self-catheterization presenting with fevers chills, diagnosed with sepsis likely from  urinary origin, underwent right renal stent placement due to obstruction.   Patient being seen for new onset atrial fibrillation with rapid ventricular response.  Now with possible ileus and worsening Scr, will dc Eliquis and start heparin per pharmacy consult.  9/25 0731 aPTT 111 sec, HL 2.04 9/25 1800 aPTT 67 sec, increased to 650 units/hr 9/26 0510 aPTT 57 sec, increased to 750 units/hr 9/26 2043 aPTT 102 sec, therapeutic x 1 9/27 0543 aPTT 101 sec, therapeutic x 2 9/28 0530 aPTT 82, HL 0.49, therapeutic   Goal of Therapy:  aPTT 66-102 seconds Monitor platelets by anticoagulation protocol: Yes   Plan:  9/28 0530 HL and APTT correlate / therapeutic - will continue Heparin at current rate and recheck HL and CBC in am   Ena Dawley, PharmD Clinical Pharmacist 06/28/2020 6:52 AM

## 2020-06-28 NOTE — Progress Notes (Signed)
Pt AAox4 resting in bed sleepy and quiet. VS stable on room air. On tele NSR per command center.  Pt with no complaints today. K+ replacement given. Pt remains NPO, NG tube to low intermittent suction with minimal output. Foley intact draining.  Spouse updated on POC. Will continue to monitor.

## 2020-06-28 NOTE — Progress Notes (Signed)
Occupational Therapy Treatment Patient Details Name: Stephanie [name] MRN: [id number] DOB: 08/24/1941 Today's Date: 06/28/2020    History of present illness Yamila Purrington is a 28yoF c PMH: COVID infection, DM, RA, HTN, remote colon CA, who [facility].   OT comments  Pt seen for OT treatment this date to f/u re: strengthening as it pertains to performance of self care as well as preparation for ADL mobility. Treatment session somewhat limited as pt cannot participate in sup<>sit transitions while temporary femcath is in place. OT engages pt in below-listed bed-level therapeutic exercise (see other exercise section). Noted that pt needed exercises graded down with decreased reps per set and rest breaks between each set d/t low fxl activity tolerance. Pt tolerates well overall, but continues to be grossly weak in trunk and limbs. Pt requires continued skilled therapy in the acute setting and continue to anticipate she will require f/u in STR setting.    Follow Up Recommendations  SNF;Supervision/Assistance - 24 hour    Equipment Recommendations  Other (comment) (defer to next venue of care)    Recommendations for Other Services      Precautions / Restrictions Precautions Precautions: Fall Restrictions Weight Bearing Restrictions: No Other Position/Activity Restrictions: Pt with temporary femcath to R LE, no sitting up. Also w/ R IJ.       Mobility Bed Mobility               General bed mobility comments: not performed as pt with R temporary fem-cath  Transfers                      Balance                                           ADL either performed or assessed with clinical judgement   ADL                                               Vision Patient Visual Report: No change from baseline     Perception     Praxis      Cognition Arousal/Alertness: Awake/alert Behavior During Therapy: WFL for tasks  assessed/performed Overall Cognitive Status: Within Functional Limits for tasks assessed                                 General Comments: some increased processing time, somewhat HOH. Appropriately follows commands/cues.        Exercises Other Exercises Other Exercises: OT facilitates pt participation in bed level UB/core therex including straigh arm raises for 2 sets of 8, cross body punches for 2 sets of 5 alternating, 10 glute squeezes, 2 sets of 5 second holds against light resistance-elbow flexion/elbow extension. Pt with very low tolerance for any physical activity requiring OT to grade exercises down. For example-initial goal was for 15 straight arm raises consecuteively, but pt requires ~30 second rest break after 8 so broken up into two sets of 8. Rest breaks required between all sets in a similar pattern: at least 30secs-1 min throughout.   Shoulder Instructions       General Comments      Pertinent Vitals/ Pain  Pain Assessment: No/denies pain  Home Living                                          Prior Functioning/Environment              Frequency  Min 1X/week        Progress Toward Goals  OT Goals(current goals can now be found in the care plan section)  Progress towards OT goals: OT to reassess next treatment (goal progression limited d/t not being able to sit pt up currently with temporary femcath)  Acute Rehab OT Goals Patient Stated Goal: go home OT Goal Formulation: With patient Time For Goal Achievement: 07/07/20 Potential to Achieve Goals: Good  Plan Discharge plan remains appropriate    Co-evaluation                 AM-PAC OT "6 Clicks" Daily Activity     Outcome Measure   Help from another person eating meals?: A Little Help from another person taking care of personal grooming?: A Lot Help from another person toileting, which includes using toliet, bedpan, or urinal?: Total Help from another  person bathing (including washing, rinsing, drying)?: Total Help from another person to put on and taking off regular upper body clothing?: A Lot Help from another person to put on and taking off regular lower body clothing?: Total 6 Click Score: 10    End of Session    OT Visit Diagnosis: Unsteadiness on feet (R26.81);Muscle weakness (generalized) (M62.81)   Activity Tolerance Patient limited by fatigue   Patient Left in bed;with call bell/phone within reach;with bed alarm set   Nurse Communication Other (comment) (notified that sodium bi-carb IV line is beeping)        Time: 1010-1038 OT Time Calculation (min): 28 min  Charges: OT General Charges $OT Visit: 1 Visit OT Treatments $Therapeutic Exercise: 23-37 mins  Gerrianne Scale, MS, OTR/L ascom [phone] 06/28/20, 1:47 PM

## 2020-06-28 NOTE — Progress Notes (Addendum)
[facility]  ROUNDING NOTE   Subjective:   Stephanie [name] is a 79 y.o. female who presents with small bowel obstruction associated with COVID-19.  She has now progressed to the point where her renal insufficiency has worsened acutely and she requires hemodialysis  Patient found resting in bed, in no acute distress, explained plan to start dialysis today. Patient has dialysis catheter placed yesterday.  Objective:  Vital signs in last 24 hours:  Temp:  [97.7 F (36.5 C)-98 F (36.7 C)] 97.8 F (36.6 C) (09/28 1113) Pulse Rate:  [66-115] 85 (09/28 1113) Resp:  [17-20] 19 (09/28 1113) BP: (103-157)/(29-95) 150/54 (09/28 1114) SpO2:  [93 %-96 %] 93 % (09/28 1113)  Weight change:  Filed Weights   06/19/20 0625 06/21/20 0125 06/22/20 0453  Weight: 80.4 kg 82.2 kg 86.8 kg    Intake/Output: I/O last 3 completed shifts: In: 2049.9 [I.V.:2049.9] Out: 2350 [Urine:1250; Emesis/NG output:1100]   Intake/Output this shift:  No intake/output data recorded.  Physical Exam: General: No acute distress  Head: Normocephalic, atraumatic.NGT in place  Eyes: Anicteric  Neck: Supple  Lungs:  Bilateral Rhonchi +  Heart: Regular rate and rhythm  Abdomen:  Mild distension +, tenderness +  Extremities:  No peripheral edema.  Neurologic: Nonfocal, moving all four extremities  Skin: No new rashes or lesions  Access: Rt IJ temp dialysis  catheter placed on 7/98/92    Basic Metabolic Panel: Recent Labs  Lab 06/22/20 0543 06/22/20 0543 06/23/20 0534 06/23/20 0534 06/24/20 0610 06/24/20 0610 06/25/20 0731 06/26/20 0510 06/28/20 0530  NA 128*   < > 126*  --  124*  --  124* 127* 133*  K 3.5   < > 4.6  --  4.4  --  4.0 3.4* 2.5*  CL 100   < > 100  --  98  --  95* 90* 87*  CO2 14*   < > 12*  --  12*  --  14* 20* 27  GLUCOSE 131*   < > 132*  --  129*  --  179* 186* 131*  BUN 54*   < > 65*  --  87*  --  94* 98* 91*  CREATININE 4.06*   < > 4.77*  --  5.39*  --  5.96* 6.14* 6.33*   CALCIUM 6.5*   < > 6.4*   < > 6.4*   < > 6.5* 6.4* 6.5*  MG 1.7  --  1.7  --  1.5*  --  1.9  --  1.8  PHOS  --   --   --   --   --   --   --   --  9.3*   < > = values in this interval not displayed.    Liver Function Tests: Recent Labs  Lab 06/23/20 0649 06/26/20 0510  AST  --  18  ALT  --  14  ALKPHOS  --  179*  BILITOT  --  0.9  PROT  --  5.0*  ALBUMIN 1.7* 2.7*   No results for input(s): LIPASE, AMYLASE in the last 168 hours. No results for input(s): AMMONIA in the last 168 hours.  CBC: Recent Labs  Lab 06/24/20 0610 06/25/20 0731 06/26/20 0510 06/27/20 0545 06/28/20 0530  WBC 13.1* 11.3* 6.7 5.3 6.2  HGB 9.9* 9.4* 8.9* 9.2* 8.8*  HCT 27.0* 25.4* 24.0* 24.6* 24.7*  MCV 76.5* 74.3* 72.9* 73.2* 76.5*  PLT 175 184 184 157 155    Cardiac Enzymes: No  results for input(s): CKTOTAL, CKMB, CKMBINDEX, TROPONINI in the last 168 hours.  BNP: Invalid input(s): POCBNP  CBG: Recent Labs  Lab 06/27/20 1926 06/27/20 2111 06/27/20 2302 06/28/20 0322 06/28/20 0753  GLUCAP 159* 160* 137* 126* 145*    Microbiology: Results for orders placed or performed during the hospital encounter of 06/17/20  Blood culture (routine x 2)     Status: Abnormal   Collection Time: 06/17/20  3:57 AM   Specimen: BLOOD  Result Value Ref Range Status   Specimen Description   Final    BLOOD LEFT HAND Performed at [facility], [address] [place], [place] [address]    Special Requests   Final    BOTTLES DRAWN AEROBIC AND ANAEROBIC Blood Culture adequate volume Performed at [facility], [address] [place], [place] [address]    Culture  Setup Time   Final    Organism ID to follow GRAM NEGATIVE RODS IN BOTH AEROBIC AND ANAEROBIC BOTTLES CRITICAL RESULT CALLED TO, READ BACK BY AND VERIFIED WITH: AMY THOMPSON AT 1534 ON 06/17/20 [place] Performed at [facility], [address] [place], [place] [address]    Culture KLEBSIELLA PNEUMONIAE (A)  Final    Report Status 06/19/2020 FINAL  Final   Organism ID, Bacteria KLEBSIELLA PNEUMONIAE  Final      Susceptibility   Klebsiella pneumoniae - MIC*    AMPICILLIN RESISTANT Resistant     CEFAZOLIN <=4 SENSITIVE Sensitive     CEFEPIME <=0.12 SENSITIVE Sensitive     CEFTAZIDIME <=1 SENSITIVE Sensitive     CEFTRIAXONE <=0.25 SENSITIVE Sensitive     CIPROFLOXACIN <=0.25 SENSITIVE Sensitive     GENTAMICIN <=1 SENSITIVE Sensitive     IMIPENEM <=0.25 SENSITIVE Sensitive     TRIMETH/SULFA <=20 SENSITIVE Sensitive     AMPICILLIN/SULBACTAM 4 SENSITIVE Sensitive     PIP/TAZO <=4 SENSITIVE Sensitive     * KLEBSIELLA PNEUMONIAE  Blood culture (routine x 2)     Status: Abnormal   Collection Time: 06/17/20  3:57 AM   Specimen: BLOOD  Result Value Ref Range Status   Specimen Description   Final    BLOOD RIGHT [facility] Performed at [facility], [address] [place], [place] [address]    Special Requests   Final    BOTTLES DRAWN AEROBIC AND ANAEROBIC Blood Culture adequate volume Performed at [facility], [address] [place], [place] [address]    Culture  Setup Time   Final    GRAM NEGATIVE RODS IN BOTH AEROBIC AND ANAEROBIC BOTTLES CRITICAL VALUE NOTED.  VALUE IS CONSISTENT WITH PREVIOUSLY REPORTED AND CALLED VALUE. Performed at [facility], [address] [place], [place] [address]    Culture (A)  Final    KLEBSIELLA PNEUMONIAE SUSCEPTIBILITIES PERFORMED ON PREVIOUS CULTURE WITHIN THE LAST 5 DAYS. Performed at [facility], [place] [address] [place], [place] [address]    Report Status 06/19/2020 FINAL  Final  Blood Culture ID Panel (Reflexed)     Status: Abnormal   Collection Time: 06/17/20  3:57 AM  Result Value Ref Range Status   Enterococcus faecalis NOT DETECTED NOT DETECTED Final   Enterococcus Faecium NOT DETECTED NOT DETECTED Final   Listeria monocytogenes NOT DETECTED NOT DETECTED Final   Staphylococcus species NOT DETECTED NOT DETECTED Final    Staphylococcus aureus (BCID) NOT DETECTED NOT DETECTED Final   Staphylococcus epidermidis NOT DETECTED NOT DETECTED Final   Staphylococcus lugdunensis NOT DETECTED NOT DETECTED Final  Streptococcus species NOT DETECTED NOT DETECTED Final   Streptococcus agalactiae NOT DETECTED NOT DETECTED Final   Streptococcus pneumoniae NOT DETECTED NOT DETECTED Final   Streptococcus pyogenes NOT DETECTED NOT DETECTED Final   A.calcoaceticus-baumannii NOT DETECTED NOT DETECTED Final   Bacteroides fragilis NOT DETECTED NOT DETECTED Final   Enterobacterales DETECTED (A) NOT DETECTED Final    Comment: Enterobacterales represent a large order of gram negative bacteria, not a single organism. CRITICAL RESULT CALLED TO, READ BACK BY AND VERIFIED WITH: AMY THOMPSON AT 0109 ON 06/17/20 SNG    Enterobacter cloacae complex NOT DETECTED NOT DETECTED Final   Escherichia coli NOT DETECTED NOT DETECTED Final   Klebsiella aerogenes NOT DETECTED NOT DETECTED Final   Klebsiella oxytoca NOT DETECTED NOT DETECTED Final   Klebsiella pneumoniae DETECTED (A) NOT DETECTED Final    Comment: CRITICAL RESULT CALLED TO, READ BACK BY AND VERIFIED WITH: AMY THOMPSON RN AT 3235 ON 06/17/20 SNG    Proteus species NOT DETECTED NOT DETECTED Final   Salmonella species NOT DETECTED NOT DETECTED Final   Serratia marcescens NOT DETECTED NOT DETECTED Final   Haemophilus influenzae NOT DETECTED NOT DETECTED Final   Neisseria meningitidis NOT DETECTED NOT DETECTED Final   Pseudomonas aeruginosa NOT DETECTED NOT DETECTED Final   Stenotrophomonas maltophilia NOT DETECTED NOT DETECTED Final   Candida albicans NOT DETECTED NOT DETECTED Final   Candida auris NOT DETECTED NOT DETECTED Final   Candida glabrata NOT DETECTED NOT DETECTED Final   Candida krusei NOT DETECTED NOT DETECTED Final   Candida parapsilosis NOT DETECTED NOT DETECTED Final   Candida tropicalis NOT DETECTED NOT DETECTED Final   Cryptococcus neoformans/gattii NOT DETECTED  NOT DETECTED Final   CTX-M ESBL NOT DETECTED NOT DETECTED Final   Carbapenem resistance IMP NOT DETECTED NOT DETECTED Final   Carbapenem resistance KPC NOT DETECTED NOT DETECTED Final   Carbapenem resistance NDM NOT DETECTED NOT DETECTED Final   Carbapenem resist OXA 48 LIKE NOT DETECTED NOT DETECTED Final   Carbapenem resistance VIM NOT DETECTED NOT DETECTED Final    Comment: Performed at [facility], [address] [place], [place] [address]  Urine Culture     Status: Abnormal   Collection Time: 06/17/20 11:01 AM   Specimen: Urine, Random  Result Value Ref Range Status   Specimen Description   Final    URINE, RANDOM Performed at [facility], [address] [place], [place] [address]    Special Requests   Final    NONE Performed at [facility], [address] [place], [place] [address]    Culture >=100,000 COLONIES/mL KLEBSIELLA PNEUMONIAE (A)  Final   Report Status 06/19/2020 FINAL  Final   Organism ID, Bacteria KLEBSIELLA PNEUMONIAE (A)  Final      Susceptibility   Klebsiella pneumoniae - MIC*    AMPICILLIN RESISTANT Resistant     CEFAZOLIN <=4 SENSITIVE Sensitive     CEFTRIAXONE <=0.25 SENSITIVE Sensitive     CIPROFLOXACIN <=0.25 SENSITIVE Sensitive     GENTAMICIN <=1 SENSITIVE Sensitive     IMIPENEM <=0.25 SENSITIVE Sensitive     NITROFURANTOIN 32 SENSITIVE Sensitive     TRIMETH/SULFA <=20 SENSITIVE Sensitive     AMPICILLIN/SULBACTAM 4 SENSITIVE Sensitive     PIP/TAZO <=4 SENSITIVE Sensitive     * >=100,000 COLONIES/mL KLEBSIELLA PNEUMONIAE  Culture, blood (Routine X 2) w Reflex to ID Panel     Status: None   Collection Time: 06/21/20 11:07 AM   Specimen: BLOOD  Result  Value Ref Range Status   Specimen Description BLOOD RIGHT [facility]  Final   Special Requests   Final    BOTTLES DRAWN AEROBIC AND ANAEROBIC Blood Culture adequate volume   Culture   Final    NO GROWTH 5 DAYS Performed at [facility], [address]  [place], [place] [address]    Report Status 06/26/2020 FINAL  Final  Culture, blood (Routine X 2) w Reflex to ID Panel     Status: None   Collection Time: 06/21/20 11:09 AM   Specimen: BLOOD  Result Value Ref Range Status   Specimen Description BLOOD LEF[place]  Final   Special Requests   Final    BOTTLES DRAWN AEROBIC AND ANAEROBIC Blood Culture adequate volume   Culture   Final    NO GROWTH 5 DAYS Performed at [facility], [address] [place], [place] [address]    Report Status 06/26/2020 FINAL  Final    Coagulation Studies: Recent Labs    06/26/20 0510 06/27/20 0545  LABPROT 20.2* 18.6*  INR 1.8* 1.6*    Urinalysis: No results for input(s): COLORURINE, LABSPEC, PHURINE, GLUCOSEU, HGBUR, BILIRUBINUR, KETONESUR, PROTEINUR, UROBILINOGEN, NITRITE, LEUKOCYTESUR in the last 72 hours.  Invalid input(s): APPERANCEUR    Imaging: X-ray chest PA or AP  Result Date: 06/27/2020 CLINICAL DATA:  Status post PICC line placement.  COVID. EXAM: CHEST  1 VIEW COMPARISON:  CT chest 06/17/2020.  Chest x-ray 06/17/2020. FINDINGS: NG tube noted with tip below left hemidiaphragm. Right IJ line noted with tip over right atrium. No PICC line noted. Heart size normal. Left base atelectasis/infiltrate small left pleural effusion. Mild right mid lung subsegmental atelectasis. Prominent skin fold on the left. No pneumothorax. Heart size normal. Prior thoracolumbar spine fusion. IMPRESSION: 1. NG tube noted with tip below left hemidiaphragm. Right IJ line noted with tip over the right atrium. No PICC line noted. 2. Left base atelectasis/infiltrate and small left pleural effusion. Mild right mid lung subsegmental atelectasis. Electronically Signed   By: Marcello Moores  Register   On: 06/27/2020 15:30   PERIPHERAL VASCULAR CATHETERIZATION  Result Date: 06/28/2020 See op note  DG Abd Portable 1V-Small Bowel Obstruction Protocol-initial, 8 hr delay  Result Date: 06/26/2020 CLINICAL DATA:  Small bowel  obstruction.  8 hour delay EXAM: PORTABLE ABDOMEN - 1 VIEW COMPARISON:  06/26/2020 FINDINGS: Enteric tube is present with tip in the right upper quadrant, likely within the distal stomach. Gaseous distention of multiple small bowel loops consistent with small bowel obstruction. No radiopaque contrast material is demonstrated in the stomach or bowel. A right ureteral stent is present. Postoperative fixation of the lower thoracic and lumbosacral spine. IMPRESSION: Gaseous distention of multiple small bowel loops consistent with small bowel obstruction. No radiopaque contrast material is demonstrated in the stomach or bowel. Electronically Signed   By: Lucienne Capers M.D.   On: 06/26/2020 22:54   DG Abd Portable 2V  Result Date: 06/27/2020 CLINICAL DATA:  Ileus. Abdominal pain and distention. COVID positive. EXAM: X-RAY ABDOMEN 3 VIEWS COMPARISON:  06/26/2020. FINDINGS: NG tube noted in stable position. Right double-J ureteral stent stable position. Persistent distended loops of small bowel noted, improved from prior exam. Colonic gas pattern is unremarkable. No free air identified. Prior thoracolumbar and lumbosacral fusion. IMPRESSION: 1. NG tube and right double-J ureteral stent in stable position. 2. Persistent distended loops of small bowel, improved from prior exam. Colonic gas pattern is unremarkable. Electronically Signed   By: Marcello Moores  Register   On: 06/27/2020  07:34     Medications:   . sodium chloride Stopped (06/27/20 1630)  . heparin 750 Units/hr (06/28/20 0400)  . potassium chloride 10 mEq (06/28/20 1137)  . sodium bicarbonate (isotonic) 150 mEq in D5W 1000 mL infusion 30 mL/hr at 06/28/20 0400  . TPN ADULT (ION)     . vitamin C  500 mg Oral Daily  . brimonidine  1 drop Both Eyes BID  . chlorhexidine  15 mL Mouth Rinse BID  . Chlorhexidine Gluconate Cloth  6 each Topical Q0600  . hydrALAZINE  10 mg Intravenous Q6H  . insulin aspart  0-9 Units Subcutaneous Q4H  . mouth rinse  15 mL  Mouth Rinse BID  . metoprolol tartrate  5 mg Intravenous Q6H  . pantoprazole (PROTONIX) IV  40 mg Intravenous Q24H  . Ensure Max Protein  11 oz Oral TID  . sodium chloride flush  10-40 mL Intracatheter Q12H  . sodium chloride flush  3 mL Intravenous Q12H  . tamsulosin  0.4 mg Oral Daily  . zinc sulfate  220 mg Oral Daily   sodium chloride, acetaminophen, albuterol, guaiFENesin-dextromethorphan, morphine injection, ondansetron (ZOFRAN) IV, sodium chloride flush  Assessment/ Plan:  Ms. LASHUNA TAMASHIRO is a 79 y.o.  female admitted to [facility] for COVID pneumonia. Patient found to have klebsiella sepsis and had right sided pyelonephritis. She underwent right ureteral stent placed on on 9/18.  Patient had IV contrast exposure on 9/17.   1)Renal  AKI secondary to ATN Patient has AKI secondary to multiple factors Patient has AKI secondary to sepsis/obstructive uropathy/did have IV contrast exposure/hypotension  Creatinine today is 6.33, worsening Planning to start dialysis today Re-assess for HD daily  2)HTN Blood pressure readings slightly above the goal  3)Anemia of renal failure Lab Results  Component Value Date   HGB 8.8 (L) 06/28/2020   Will continue monitoring Consider EPO with HD  4) hyponatremia Hyponatremia improving Na+ 133 today  5) obstructive uropathy with pyelonephritis Patient is status post right ureteral stent placement by Dr. Jeffie Pollock on September 18 Urine cloudy,dark amber with sediments. Total urine output for the preceding 24 hours is 800 ml.       LOS: 11 Princy Raju 9/28/202112:04 PM   Patient was seen and examined with [place] Oyster, DNP.   I agree with her note.  Murlean Iba , MD [facility]  9/28/20215:34 PM

## 2020-06-28 NOTE — Progress Notes (Signed)
PT Cancellation Note  Patient Details Name: Stephanie [name] MRN: [id number] DOB: [birth year]   Cancelled Treatment:    Reason Eval/Treat Not Completed: Other (comment) Upon chart reviewing, pt's K+ level is 2.5 and Ca2+ level is 6.5, which is a contraindication to exercise. Will hold at this time and re-attempt next date/time as available and pt medically appropriate for PT tx session.   Noemi Chapel, SPT Bernita Raisin 06/28/2020, 9:26 AM

## 2020-06-28 NOTE — Consult Note (Signed)
PHARMACY - TOTAL PARENTERAL NUTRITION CONSULT NOTE   Indication: Small bowel obstruction  Patient Measurements: Height: 5\' 6"  (167.6 cm) Weight: 86.8 kg (191 lb 6.4 oz) IBW/kg (Calculated) : 59.3 TPN AdjBW (KG): 64.5 Body mass index is 30.89 kg/m. 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  Patient is fully vaccinated.  Pt developed a SBO but LBM 9/27 with some improvement.    Pharmacy has been consulted to initiate and monitor TPN.   Glucose / Insulin: 10 units Novolog per SSI 9/27 Electrolytes: Na 133, K = 2.5, Phos = 9.3 Corrected Ca =  7.54 Renal: Scr trend since admission 1.37---->6.33  LFTs / TGs: from 9/26 AST 18, ALT 14 -  will check baseline TG's  Prealbumin / albumin: albumin = 2.7 Intake / Output; MIVF: I/O net 242 9/27 - on 39ml/hr of Na bicarb in dextrose.  GI Imaging: 9/26 Abd xray - Persistent distended loops of small bowel Surgeries / Procedures:   Central access: 06/27/20 - CVC triple lumen R internal jugular TPN start date: 06/28/20  Nutritional Goals (per RD recommendation on 9/28):  Kcal: 1900-2100 Protein: 95-105 grams Fluid: UOP + 1 L  Goal tpn rate is dependent on start of dialyses today and fluid status - pharmacy will monitor and adjust pending results of today's dialyses treatment  In the meantime, goal TPN rate is 80 mL/hr (provides 85 g of protein and 1900 kcals per day)  Current Nutrition:  NPO but ice chips  Plan:  Start TPN at 63mL/hr at 1800 (1/3 goal rate)  Electrolytes in TPN: 48mEq/L of Na, 26mEq/L of K, 60mEq/L of Ca, 32mEq/L of Mg, and 50mmol/L of Phos.   Also repleted KCL 54meq IV x4 this am per hypokalemia  Cl:Ac ratio 1:1 (will monitor Chloride)  (Phos adjusted down today  per am labs)  Add standard MVI and trace elements to TPN (holding chromium)  Continue Sensitive q4h SSI and adjust as needed   Continue MIVF/Bicarb drip @ 30 mL/hr at 1800 pending input from nephrology  Monitor TPN labs on Mon/Thurs  Lu Duffel, PharmD, BCPS Clinical Pharmacist 06/28/2020 8:51 AM

## 2020-06-28 NOTE — Progress Notes (Addendum)
[facility] SURGICAL PROGRESS NOTE (cpt [phone])  Hospital Day(s): 11.   Interval History: Patient seen and examined, no acute events or new complaints overnight. Patient reports she feels somewhat improved from an abdominal standpoint: abdominal pain improved. Denies fever, chills, nausea, emesis. She continues to have significant electrolyte derangement with hypokalemia to 2.5 and and hypocalcemia to 6.5. She does have hyperphosphatemia to 9.3 but this is consistent with degree of ESRD (sCr - 6.33), UO 800 ccs. She did get temporary dialysis catheter placement yesterday (09/27). NGT with 50 ccs out. She had a bowel movement recorded yesterday and reports a small amount of flatus. No new imaging studies.   Review of Systems:  Constitutional: denies fever, chills  HEENT: denies cough or congestion  Respiratory: denies any shortness of breath  Cardiovascular: denies chest pain or palpitations  Gastrointestinal: + abdominal pain (improved), denied N/V, or diarrhea/and bowel function as per interval history Genitourinary: denies burning with urination or urinary frequency Musculoskeletal: denies pain, decreased motor or sensation  Vital signs in last 24 hours: [min-max] current  Temp:  [97.5 F (36.4 C)-98 F (36.7 C)] 97.7 F (36.5 C) (09/28 0323) Pulse Rate:  [66-108] 106 (09/28 0323) Resp:  [15-20] 20 (09/28 0323) BP: (103-157)/(45-95) 106/49 (09/28 0323) SpO2:  [93 %-96 %] 94 % (09/28 0323)     Height: 5\' 6"  (167.6 cm) Weight: 86.8 kg BMI (Calculated): 30.91   Intake/Output last 2 shifts:  09/27 0701 - 09/28 0700 In: 1092.8 [I.V.:1092.8] Out: 850 [Urine:800; Emesis/NG output:50]   Physical Exam:  Constitutional: alert, cooperative and no distress  HENT: normocephalic without obvious abnormality, NGT in place Eyes: PERRL, EOM's grossly intact and symmetric  Respiratory: breathing non-labored at rest  Cardiovascular: regular rate and sinus rhythm  Gastrointestinal:  Soft, very mild generalized tenderness this morning, distension appears improved, certainly no rebound/guarding/peritonitis Musculoskeletal: no edema or wounds, motor and sensation grossly intact, NT    Labs:  CBC Latest Ref Rng & Units 06/27/2020 06/26/2020 06/25/2020  WBC 4.0 - 10.5 K/uL 5.3 6.7 11.3(H)  Hemoglobin 12.0 - 15.0 g/dL 9.2(L) 8.9(L) 9.4(L)  Hematocrit 36 - 46 % 24.6(L) 24.0(L) 25.4(L)  Platelets 150 - 400 K/uL 157 184 184   CMP Latest Ref Rng & Units 06/26/2020 06/25/2020 06/24/2020  Glucose 70 - 99 mg/dL 186(H) 179(H) 129(H)  BUN 8 - 23 mg/dL 98(H) 94(H) 87(H)  Creatinine 0.44 - 1.00 mg/dL 6.14(H) 5.96(H) 5.39(H)  Sodium 135 - 145 mmol/L 127(L) 124(L) 124(L)  Potassium 3.5 - 5.1 mmol/L 3.4(L) 4.0 4.4  Chloride 98 - 111 mmol/L 90(L) 95(L) 98  CO2 22 - 32 mmol/L 20(L) 14(L) 12(L)  Calcium 8.9 - 10.3 mg/dL 6.4(LL) 6.5(L) 6.4(LL)  Total Protein 6.5 - 8.1 g/dL 5.0(L) - -  Total Bilirubin 0.3 - 1.2 mg/dL 0.9 - -  Alkaline Phos 38 - 126 U/L 179(H) - -  AST 15 - 41 U/L 18 - -  ALT 0 - 44 U/L 14 - -    Imaging studies: No new pertinent imaging studies   Assessment/Plan: (ICD-10's: K49.609) 79 y.o. female with what may be a combination of paralytic ileus secondary to electrolyte derangements and partial SBO given previous abdominal surgeries and CT findings on 09/24, but she is showing signs of clinical improvement this morning, complicated by pertinent comorbidities including debilitation, numerous medical conditions now with ESRD requiring HD.   - Replete electrolytes; monitor  - She will begin TPN today; 1/2 rate; monitor electrolytes closely  - Remain NPO + IVF  resuscitation  - Continue NGT decompression; low-intermittent suction; monitor and record output  - Monitor abdominal examination; on-going bowel function  - Morning KUB tomorrow  - No emergent surgical intervention; if she fails to clinically improve by Thursday (09/30), we will tentatively plan on possible  exploratory laparotomy at that date.    - Mobilize as feasible             - Further management per primary service; we will follow    All of the above findings and recommendations were discussed with the patient, and the medical team, and all of patient's questions were answered to her expressed satisfaction.  -- Edison Simon, PA-C [facility] 06/28/2020, 7:11 AM [phone] M-F: 7am - 4pm  I agree with the above documentation, which I have edited where appropriate. Fredirick Maudlin  8:08 PM

## 2020-06-28 NOTE — Progress Notes (Signed)
Initial Nutrition Assessment  DOCUMENTATION CODES:   Not applicable  INTERVENTION:  Initiate TPN per pharmacy.  Monitor magnesium, potassium, and phosphorus daily for at least 3 days, MD to replete as needed, as pt is at risk for refeeding syndrome.  NUTRITION DIAGNOSIS:   Inadequate oral intake related to inability to eat, acute illness (paralytic ileus and partial SBO) as evidenced by NPO status.  GOAL:   Patient will meet greater than or equal to 90% of their needs  MONITOR:   Diet advancement, Labs, Weight trends, Skin, I & O's, Other (Comment) (TPN tolerance)  REASON FOR ASSESSMENT:   Consult New TPN/TNA  ASSESSMENT:   79 year old female with PMHx of colon cancer s/p colon surgery (no details available in chart), vitamin B12 deficiency, DM, HTN, arthritis, HLD, glaucoma, GERD, asthma admitted with COVID-19, paralytic ileus secondary to electrolytes derangements and partial SBO, also with AKI on CKD requiring HD now.   9/18 s/p right ureteral stent placement  -Per surgery note if patient does not clinically improve by 9/30 tentative plan for exploratory laparotomy.  Met with patient at bedside this morning. She reports she had a poor appetite and PO intake for one week related to abdominal pain/N/V. Per review of chart patient was started on clear liquid diet on 9/18, advanced to full liquids on 9/21, and regular on 9/23. However her PO intake was only 15-50%. She was then made NPO on 9/24. She currently has NGT in place to LIS. She reports that at baseline she had a good appetite and intake. She reports she would eat 3 meals per day. She usually would have chicken or beef with vegetables/other sides. Patient reports she understands plan for initiation of TPN today and does not have any other questions at this time.  Patient reports her UBW is 166 lbs (75.3 kg). She denies any unintentional wt loss and reports she is wt stable. Per review of chart patient was around 77-78 kg  PTA. She is currently documented to be 86.8 kg (191.4 lbs), which is falsely elevated by edema. RD suspects patient's dry weight lies between 75-77 kg but will continue to monitor.  IV Access: right IJ CVC triple lumen placed 9/27  Medications reviewed and include: vitamin C 500 mg daily (pt unable to take as she is NPO), Novolog 0-9 units Q4hrs, Protonix, zinc sulfate 220 mg daily (pt unable to take), heparin infusion, sodium bicarbonate in D5 at 30 mL/hr.  Labs reviewed: CBG 126-145, Sodium 133, Potassium 3, Chloride 87, BUN 91, Creatinine 6.33, Anion gap 19, Phosphorus 9.3.  Discussed with Pharmacy.  Patient is at risk for malnutrition.  NUTRITION - FOCUSED PHYSICAL EXAM:    Most Recent Value  Orbital Region No depletion  Upper Arm Region Mild depletion  Thoracic and Lumbar Region No depletion  Buccal Region No depletion  Temple Region Mild depletion  Clavicle Bone Region No depletion  Clavicle and Acromion Bone Region Mild depletion  Scapular Bone Region No depletion  Dorsal Hand Mild depletion  Patellar Region Mild depletion  Anterior Thigh Region Mild depletion  Posterior Calf Region Mild depletion  Edema (RD Assessment) Mild  Hair Reviewed  Eyes Reviewed  Mouth Reviewed  Skin Reviewed  Nails Reviewed     Diet Order:   Diet Order            Diet NPO time specified Except for: Ice Chips  Diet effective now  EDUCATION NEEDS:   No education needs have been identified at this time  Skin:  Skin Assessment: Skin Integrity Issues: Skin Integrity Issues:: Stage I Stage I: left buttocks (2cm x 3cm)  Last BM:  06/27/2020 - medium type 7  Height:   Ht Readings from Last 1 Encounters:  06/18/20 _0  (1.676 m)   Weight:   Wt Readings from Last 1 Encounters:  06/22/20 86.8 kg   Ideal Body Weight:  59.1 kg  BMI:  Body mass index is 30.89 kg/m.  Estimated Nutritional Needs:   Kcal:  1900-2100  Protein:  95-105 grams  Fluid:  UOP + 1  L  Stephanie Barnacle, MS, RD, LDN Pager number available on Amion

## 2020-06-28 NOTE — Progress Notes (Signed)
[facility] [place] NAME: Stephanie [name]    MR#:  [id number]  DATE OF BIRTH:  January 25, 1941  SUBJECTIVE:  patient NG output 50cc on 9/27 Had BM 9/26 & 9/27 No fever. No respiratory distress. Complains of weakness. Started on dialysis yesterday REVIEW OF SYSTEMS:   Review of Systems  Constitutional: Negative for chills, fever and weight loss.  HENT: Negative for ear discharge, ear pain and nosebleeds.   Eyes: Negative for blurred vision, pain and discharge.  Respiratory: Negative for sputum production, shortness of breath, wheezing and stridor.   Cardiovascular: Negative for chest pain, palpitations, orthopnea and PND.  Gastrointestinal: Negative for abdominal pain, diarrhea, nausea and vomiting.  Genitourinary: Negative for frequency and urgency.  Musculoskeletal: Positive for joint pain. Negative for back pain.  Neurological: Positive for weakness. Negative for sensory change, speech change and focal weakness.  Psychiatric/Behavioral: Negative for depression and hallucinations. The patient is not nervous/anxious.    Tolerating Diet:NPO , NG+ Tolerating PT: not able to participate since patient is on NG suction  DRUG ALLERGIES:   Allergies  Allergen Reactions  . Ibuprofen Itching  . Indomethacin   . Infliximab   . Methotrexate Derivatives   . Moexipril   . Percocet [Oxycodone-Acetaminophen] Itching  . Naprosyn [Naproxen] Rash    VITALS:  Blood pressure (!) 150/54, pulse 85, temperature 97.8 F (36.6 C), resp. rate 19, height 5\' 6"  (1.676 m), weight 86.8 kg, SpO2 93 %.  PHYSICAL EXAMINATION:   Physical Exam  GENERAL:  79 y.o.-year-old patient lying in the bed with no acute distress.  Chronically ill HEENT: Head atraumatic, normocephalic. Oropharynx and nasopharynx clear. NG + triple lumen catheter right IJ ( 9/27) LUNGS: Normal breath sounds bilaterally, no wheezing, rales, rhonchi. No use of accessory muscles of respiration.   CARDIOVASCULAR: S1, S2 normal. No murmurs, rubs, or gallops.  ABDOMEN: Soft, nontender,  +distended. Few Bowel sounds present. No organomegaly or mass.  EXTREMITIES: No cyanosis, clubbing  + ankle edema b/l.   Femoral non-tunnelled HD cath (9/27) Foley + (9 days) NEUROLOGIC: grossly nonfocal.deconditioned PSYCHIATRIC:  patient is alert and oriented times three SKIN: Pressure Injury 06/18/20 Buttocks Left;Lower Stage 1 -  Intact skin with non-blanchable redness of a localized area usually over a bony prominence. (Active)  06/18/20 0600  Location: Buttocks  Location Orientation: Left;Lower  Staging: Stage 1 -  Intact skin with non-blanchable redness of a localized area usually over a bony prominence.  Wound Description (Comments):   Present on Admission: Yes   LABORATORY PANEL:  CBC Recent Labs  Lab 06/28/20 0530  WBC 6.2  HGB 8.8*  HCT 24.7*  PLT 155    Chemistries  Recent Labs  Lab 06/26/20 0510 06/26/20 0510 06/28/20 0530 06/28/20 0530 06/28/20 1222  NA 127*   < > 133*  --   --   K 3.4*   < > 2.5*   < > 3.0*  CL 90*   < > 87*  --   --   CO2 20*   < > 27  --   --   GLUCOSE 186*   < > 131*  --   --   BUN 98*   < > 91*  --   --   CREATININE 6.14*   < > 6.33*  --   --   CALCIUM 6.4*   < > 6.5*  --   --   MG  --   --  1.8  --   --  AST 18  --   --   --   --   ALT 14  --   --   --   --   ALKPHOS 179*  --   --   --   --   BILITOT 0.9  --   --   --   --    < > = values in this interval not displayed.   Cardiac Enzymes No results for input(s): TROPONINI in the last 168 hours. RADIOLOGY:  X-ray chest PA or AP  Result Date: 06/27/2020 CLINICAL DATA:  Status post PICC line placement.  COVID. EXAM: CHEST  1 VIEW COMPARISON:  CT chest 06/17/2020.  Chest x-ray 06/17/2020. FINDINGS: NG tube noted with tip below left hemidiaphragm. Right IJ line noted with tip over right atrium. No PICC line noted. Heart size normal. Left base atelectasis/infiltrate small left pleural  effusion. Mild right mid lung subsegmental atelectasis. Prominent skin fold on the left. No pneumothorax. Heart size normal. Prior thoracolumbar spine fusion. IMPRESSION: 1. NG tube noted with tip below left hemidiaphragm. Right IJ line noted with tip over the right atrium. No PICC line noted. 2. Left base atelectasis/infiltrate and small left pleural effusion. Mild right mid lung subsegmental atelectasis. Electronically Signed   By: Marcello Moores  Register   On: 06/27/2020 15:30   PERIPHERAL VASCULAR CATHETERIZATION  Result Date: 06/28/2020 See op note  DG Abd Portable 1V-Small Bowel Obstruction Protocol-initial, 8 hr delay  Result Date: 06/26/2020 CLINICAL DATA:  Small bowel obstruction.  8 hour delay EXAM: PORTABLE ABDOMEN - 1 VIEW COMPARISON:  06/26/2020 FINDINGS: Enteric tube is present with tip in the right upper quadrant, likely within the distal stomach. Gaseous distention of multiple small bowel loops consistent with small bowel obstruction. No radiopaque contrast material is demonstrated in the stomach or bowel. A right ureteral stent is present. Postoperative fixation of the lower thoracic and lumbosacral spine. IMPRESSION: Gaseous distention of multiple small bowel loops consistent with small bowel obstruction. No radiopaque contrast material is demonstrated in the stomach or bowel. Electronically Signed   By: Lucienne Capers M.D.   On: 06/26/2020 22:54   DG Abd Portable 2V  Result Date: 06/27/2020 CLINICAL DATA:  Ileus. Abdominal pain and distention. COVID positive. EXAM: X-RAY ABDOMEN 3 VIEWS COMPARISON:  06/26/2020. FINDINGS: NG tube noted in stable position. Right double-J ureteral stent stable position. Persistent distended loops of small bowel noted, improved from prior exam. Colonic gas pattern is unremarkable. No free air identified. Prior thoracolumbar and lumbosacral fusion. IMPRESSION: 1. NG tube and right double-J ureteral stent in stable position. 2. Persistent distended loops of small  bowel, improved from prior exam. Colonic gas pattern is unremarkable. Electronically Signed   By: Marcello Moores  Register   On: 06/27/2020 07:34   ASSESSMENT AND PLAN:  Stephanie [name] a 89 y.o.femalewith medical history significant forCOVID-19 viral infection(positive PCR test June 09, 2020),history of diabetes mellitus, history of rheumatoid arthritis on chronic immunosuppressive therapy, hypertension and a remote history of colon cancer who was brought into the ER by EMS for evaluation of fever. Patient had a T-max of 104Fat home.She complains of generalized weakness and worsening shortness of breath but denies having any nausea, no vomiting, no chest pain or abdominal pain. Patient does self caths at home. Patient is fully vaccinated She was started on prednisone by her primary care provider for presumed COVID-19 bronchitis   # SBO -- No N/V.  KUB showed SBO. --CT abd with oral contrast showed "High-grade small-bowel obstruction with  an abrupt transition point in the central lower abdomen. Favor Adhesions." --GenSurg consult  With Dr Dahlia Byes appreciated, rec NG tube for decompression--trying to Clamp NG -9/26--ABd xray--not much improvement, NG out put 1000cc -9/27 NG output 50cc-- Dr Dahlia Byes plans for exploratory Lap if no improvement by 9/30 -9/28--started on TPN thru right IJ placed on 9/27   # Sepsis 2/2 Klebsiella bacteremia with AKI/ATN # right pyelonephritis  --As evidenced by fever with a T-max of103F,tachycardia, tachypnea, leukocytosis of 20,000 with a left shift and lactic acid of 3.1.  UA positive for infection.  -9/26--completed 10 days of IV abxs -repeat blood culture from 19th September negative -Patient afebrile. White count stable  -recent CT abdomen no abdominal source of infection. --Urine grew pan-sensitive Klebsiella. --On admission--> blood cx 2/2 positive for Klebsiella, --9/21--repeat blood cx obtained  neg growth so far.  # Severe right  hydronephrosis --CT renal showed "Progressive right UPJ obstruction compared to prior October of 2020." --right stent placement by urology 9/18 (found frank pus in the kidney) --US renal 9/20 showed resolved hydronephrosis --continue Foley per urology Dr Jeffie Pollock (note 06/19/2020) --strict I/O  # New onset Afib w RVR, persistent # Acquired thrombophilia  ---started heparin gtt 9/18, transitioned to Eliquis when able to take po -9/25--off IV diltiazem--HR 70's -[facility] -cardiology consulted -At home on regimen of lopressor 200 mg BID and Cardizem 120 mg daily --currently  switched to IV metoprolol --HR stable  # Hematuria -- Hgb stable.  Pt needs to be on anticoagulation due to Afib. --Monitor Hgb  # COVID-19 infection --Pt is fully vaccinated.  Patient'sCOVID-19 PCR test was positive on 06/09/20.  No overt respiratory symptoms.  CT chest clear.  No real O2 requirement on presentation.  Currently on room air. --Remdesivir completed this admisison --according to infection control, due to pt's immunosuppressed status PTA, pt needs the 21 day isolation till 06/29/2020.will move pt out on 06/30/2020 to @C  floor  AKIATN in the setting of sepsis, worsening Metabolic acidosis, uncompensated --on admission creatinine is 1.5.  Baseline Cr 0.8.  Acute renal failure secondary to ATN with sepsis, obstructive uropathy, IV contrast nephropathy and prolonged prerenal azotemia. --US renal showed resolved hydro and medical renal disease --nephrology consulted--appreciate Dr Roslyn Smiling input --bicarb 33. ABG looks better today (9/26) --start sodium bicarb D5 infusion @100  ml/hr, per nephrology--now d/ced IVF -Creat 6.14. Dr Theador Hawthorne has spoken with patient and husband regarding starting hemodialysis. --9/27 -- vascular consult for temporary catheter placement and Central line --initiated HD --9/28 HD today  Lactic acidosis 2/2 sepsis --resolved with IVF.  Hx Diabetes mellitus  hyperglycemia related  to systemic steroid administration Chest x-ray is negative for pneumonia and patient is not hypoxic Prednisone d/c'ed. --SSI TID  Hx of HTN--uncontrolled --BP drops sometimes from a lot of rate control agents --BP wnl now --IV hydralazine 10 mg qid and Metoprolol IV 5 mg qid  History of rheumatoid arthritis --continue to hold Arava due to sepsis  Hypokalemia and hypomag, not POA --monitor and replete PRN  HLD --continue home statin  Hypocalcemia -Calcium 6.4 today, corrected Calcium calculated at 7.1 -9/26 --IV calcium gluconate x1  Anemia with renal failure: Hgb stable in 8.8  Hypoalbuminemia  --albumin 2.7 --received couple days of IV albumin, per nephrology--now d/ced per Dr Theador Hawthorne  patient has multiple comorbidities. Overall has poor long-term prognosis. Palliative care consultation.  DVT prophylaxis: RW:[id number] gtt Code Status: DNR  Family Communication: husband updated on the phone on 06/27/2020 Status is: inpatient Dispo:   The patient  is from: home Anticipated d/c is to: home Anticipated d/c date is: >3 days Patient currently is not medically stable to d/c due to: worsening AKI, new SBO, on NG tube for decompression. now on hemodialysis, may need exp lap    TOTAL TIME TAKING CARE OF THIS PATIENT: *25* minutes.  >50% time spent on counselling and coordination of care  Note: This dictation was prepared with Dragon dictation along with smaller phrase technology. Any transcriptional errors that result from this process are unintentional.  Fritzi Mandes M.D    [facility]   CC: Primary care physician; Rusty Aus, MDPatient ID: Stephanie [name], female   DOB: [birth year], 79 y.o.   MRN: [id number]

## 2020-06-28 NOTE — Progress Notes (Signed)
Progress Note  Patient Name: Stephanie [name] Date of Encounter: 06/28/2020  [facility] Cardiologist: Dr. Garen Lah  Subjective   No CP, shortness of breath, or palpitations.  Minimal abdominal pain.  Inpatient Medications    Scheduled Meds: . vitamin C  500 mg Oral Daily  . brimonidine  1 drop Both Eyes BID  . chlorhexidine  15 mL Mouth Rinse BID  . Chlorhexidine Gluconate Cloth  6 each Topical Q0600  . hydrALAZINE  10 mg Intravenous Q6H  . insulin aspart  0-9 Units Subcutaneous Q4H  . mouth rinse  15 mL Mouth Rinse BID  . metoprolol tartrate  5 mg Intravenous Q6H  . pantoprazole (PROTONIX) IV  40 mg Intravenous Q24H  . sodium chloride flush  10-40 mL Intracatheter Q12H  . sodium chloride flush  3 mL Intravenous Q12H  . tamsulosin  0.4 mg Oral Daily  . zinc sulfate  220 mg Oral Daily   Continuous Infusions: . sodium chloride Stopped (06/27/20 1630)  . heparin 750 Units/hr (06/28/20 0400)  . sodium bicarbonate (isotonic) 150 mEq in D5W 1000 mL infusion 30 mL/hr at 06/28/20 0400  . TPN ADULT (ION)     PRN Meds: sodium chloride, acetaminophen, albuterol, guaiFENesin-dextromethorphan, heparin, morphine injection, ondansetron (ZOFRAN) IV, sodium chloride flush   Vital Signs    Vitals:   06/28/20 0810 06/28/20 1113 06/28/20 1114 06/28/20 1603  BP:  (!) 148/29 (!) 150/54 (!) 168/59  Pulse: 82 85  77  Resp: 20 19  14   Temp: 97.7 F (36.5 C) 97.8 F (36.6 C)  97.8 F (36.6 C)  TempSrc:      SpO2: 93% 93%  93%  Weight:      Height:        Intake/Output Summary (Last 24 hours) at 06/28/2020 1613 Last data filed at 06/28/2020 1300 Gross per 24 hour  Intake 1092.75 ml  Output 800 ml  Net 292.75 ml   Last 3 Weights 06/22/2020 06/21/2020 06/19/2020  Weight (lbs) 191 lb 6.4 oz 181 lb 3.2 oz 177 lb 4 oz  Weight (kg) 86.818 kg 82.192 kg 80.4 kg      Telemetry    Sinus rhythm and paroxysmal atrial fibrillation - Personally Reviewed  ECG    No new  tracing.  Physical Exam   GEN: No acute distress.   Neck: No JVD Cardiac: RRR, no murmurs, rubs, or gallops.  Respiratory: Mildly diminished throughout without wheezes or crackles GI: Soft, nontender, non-distended  MS: No edema; No deformity. Neuro:  Nonfocal  Psych: Flat affect  Labs    High Sensitivity Troponin:   Recent Labs  Lab 06/17/20 0357 06/17/20 1101  TROPONINIHS 61* 71*      Chemistry Recent Labs  Lab 06/23/20 0649 06/24/20 0610 06/25/20 0731 06/25/20 0731 06/26/20 0510 06/28/20 0530 06/28/20 1222  NA  --    < > 124*  --  127* 133*  --   K  --    < > 4.0   < > 3.4* 2.5* 3.0*  CL  --    < > 95*  --  90* 87*  --   CO2  --    < > 14*  --  20* 27  --   GLUCOSE  --    < > 179*  --  186* 131*  --   BUN  --    < > 94*  --  98* 91*  --   CREATININE  --    < > 5.96*  --  6.14* 6.33*  --   CALCIUM  --    < > 6.5*  --  6.4* 6.5*  --   PROT  --   --   --   --  5.0*  --   --   ALBUMIN 1.7*  --   --   --  2.7*  --   --   AST  --   --   --   --  18  --   --   ALT  --   --   --   --  14  --   --   ALKPHOS  --   --   --   --  179*  --   --   BILITOT  --   --   --   --  0.9  --   --   GFRNONAA  --    < > 6*  --  6* 6*  --   GFRAA  --    < > 7*  --  7* 7*  --   ANIONGAP  --    < > 15  --  17* 19*  --    < > = values in this interval not displayed.     Hematology Recent Labs  Lab 06/26/20 0510 06/27/20 0545 06/28/20 0530  WBC 6.7 5.3 6.2  RBC 3.29* 3.36* 3.23*  HGB 8.9* 9.2* 8.8*  HCT 24.0* 24.6* 24.7*  MCV 72.9* 73.2* 76.5*  MCH 27.1 27.4 27.2  MCHC 37.1* 37.4* 35.6  RDW 15.1 14.8 15.4  PLT 184 157 155    BNPNo results for input(s): BNP, PROBNP in the last 168 hours.   DDimer No results for input(s): DDIMER in the last 168 hours.   Radiology    X-ray chest PA or AP  Result Date: 06/27/2020 CLINICAL DATA:  Status post PICC line placement.  COVID. EXAM: CHEST  1 VIEW COMPARISON:  CT chest 06/17/2020.  Chest x-ray 06/17/2020. FINDINGS: NG tube noted  with tip below left hemidiaphragm. Right IJ line noted with tip over right atrium. No PICC line noted. Heart size normal. Left base atelectasis/infiltrate small left pleural effusion. Mild right mid lung subsegmental atelectasis. Prominent skin fold on the left. No pneumothorax. Heart size normal. Prior thoracolumbar spine fusion. IMPRESSION: 1. NG tube noted with tip below left hemidiaphragm. Right IJ line noted with tip over the right atrium. No PICC line noted. 2. Left base atelectasis/infiltrate and small left pleural effusion. Mild right mid lung subsegmental atelectasis. Electronically Signed   By: Marcello Moores  Register   On: 06/27/2020 15:30   PERIPHERAL VASCULAR CATHETERIZATION  Result Date: 06/28/2020 See op note  DG Abd Portable 1V-Small Bowel Obstruction Protocol-initial, 8 hr delay  Result Date: 06/26/2020 CLINICAL DATA:  Small bowel obstruction.  8 hour delay EXAM: PORTABLE ABDOMEN - 1 VIEW COMPARISON:  06/26/2020 FINDINGS: Enteric tube is present with tip in the right upper quadrant, likely within the distal stomach. Gaseous distention of multiple small bowel loops consistent with small bowel obstruction. No radiopaque contrast material is demonstrated in the stomach or bowel. A right ureteral stent is present. Postoperative fixation of the lower thoracic and lumbosacral spine. IMPRESSION: Gaseous distention of multiple small bowel loops consistent with small bowel obstruction. No radiopaque contrast material is demonstrated in the stomach or bowel. Electronically Signed   By: Lucienne Capers M.D.   On: 06/26/2020 22:54   DG Abd Portable 2V  Result Date: 06/27/2020 CLINICAL DATA:  Ileus. Abdominal pain and  distention. COVID positive. EXAM: X-RAY ABDOMEN 3 VIEWS COMPARISON:  06/26/2020. FINDINGS: NG tube noted in stable position. Right double-J ureteral stent stable position. Persistent distended loops of small bowel noted, improved from prior exam. Colonic gas pattern is unremarkable. No free  air identified. Prior thoracolumbar and lumbosacral fusion. IMPRESSION: 1. NG tube and right double-J ureteral stent in stable position. 2. Persistent distended loops of small bowel, improved from prior exam. Colonic gas pattern is unremarkable. Electronically Signed   By: Marcello Moores  Register   On: 06/27/2020 07:34    Cardiac Studies   TTE (06/19/2020): 1. Left ventricular ejection fraction, by estimation, is 50 to 55%. The  left ventricle has low normal function. The left ventricle has no regional  wall motion abnormalities. There is mild left ventricular hypertrophy.  Indeterminate diastolic filling due  to E-A fusion.  2. Right ventricular systolic function is normal. The right ventricular  size is normal.  3. The mitral valve is normal in structure. No evidence of mitral valve  regurgitation. No evidence of mitral stenosis.  4. The aortic valve is normal in structure. Aortic valve regurgitation is  not visualized. No aortic stenosis is present.   Patient Profile     79 y.o. female with history of hypertension, diabetes, hyperlipidemia, rheumatoid arthritis, chronic urinary retention requiring self-catheterization presenting with fevers chills, diagnosed with sepsis likely from urinary origin, underwent right renal stent placement due to obstruction. Patient being seen for new onset atrial fibrillation with rapid ventricular response and worsening renal failure (scheduled to begin hemodialysis today).  Assessment & Plan    Paroxysmal atrial fibrillation: Patient has been in and out of atrial fibrillation with reasonable rate control over the last day.  As she remains unable to take any thing by mouth, she remains on IV heparin and metoprolol.  Continue heparin gtt.  Continue metoprolol tartrate 5 mg IV Q6 hours; transition to PO when able.  GI: Currently still receiving TPN.  She remains NPO.  Per surgery and IM.  Anemia: Hemoglobin gradually trending down.  Continue to  monitor.  If hemoglobin drops further or there is evidence of active bleeding, anticoagulation should be held.  Consider PRBC transfusion for hemoglobin < 8 or symptoms.  COVID-19:  Per IM.  For questions or updates, please contact [place] Please consult [email or web address] for contact info under [facility].     Signed, Nelva Bush, MD  06/28/2020, 4:13 PM

## 2020-06-29 ENCOUNTER — Inpatient Hospital Stay: Payer: Medicare Other

## 2020-06-29 DIAGNOSIS — N185 Chronic kidney disease, stage 5: Secondary | ICD-10-CM

## 2020-06-29 DIAGNOSIS — E872 Acidosis: Secondary | ICD-10-CM

## 2020-06-29 DIAGNOSIS — E1169 Type 2 diabetes mellitus with other specified complication: Secondary | ICD-10-CM

## 2020-06-29 DIAGNOSIS — L8991 Pressure ulcer of unspecified site, stage 1: Secondary | ICD-10-CM

## 2020-06-29 DIAGNOSIS — U071 COVID-19: Secondary | ICD-10-CM | POA: Diagnosis not present

## 2020-06-29 DIAGNOSIS — D6869 Other thrombophilia: Secondary | ICD-10-CM | POA: Diagnosis not present

## 2020-06-29 DIAGNOSIS — M069 Rheumatoid arthritis, unspecified: Secondary | ICD-10-CM

## 2020-06-29 DIAGNOSIS — Z7901 Long term (current) use of anticoagulants: Secondary | ICD-10-CM

## 2020-06-29 DIAGNOSIS — A419 Sepsis, unspecified organism: Secondary | ICD-10-CM | POA: Diagnosis not present

## 2020-06-29 DIAGNOSIS — N10 Acute tubulo-interstitial nephritis: Secondary | ICD-10-CM | POA: Diagnosis not present

## 2020-06-29 DIAGNOSIS — N1330 Unspecified hydronephrosis: Secondary | ICD-10-CM

## 2020-06-29 LAB — CBC
HCT: 23.2 % — ABNORMAL LOW (ref 36.0–46.0)
Hemoglobin: 8.2 g/dL — ABNORMAL LOW (ref 12.0–15.0)
MCH: 27.2 pg (ref 26.0–34.0)
MCHC: 35.3 g/dL (ref 30.0–36.0)
MCV: 77.1 fL — ABNORMAL LOW (ref 80.0–100.0)
Platelets: 136 10*3/uL — ABNORMAL LOW (ref 150–400)
RBC: 3.01 MIL/uL — ABNORMAL LOW (ref 3.87–5.11)
RDW: 15.2 % (ref 11.5–15.5)
WBC: 7.5 10*3/uL (ref 4.0–10.5)
nRBC: 0 % (ref 0.0–0.2)

## 2020-06-29 LAB — PREALBUMIN: Prealbumin: 12.1 mg/dL — ABNORMAL LOW (ref 18–38)

## 2020-06-29 LAB — HEPARIN LEVEL (UNFRACTIONATED): Heparin Unfractionated: 0.57 IU/mL (ref 0.30–0.70)

## 2020-06-29 LAB — GLUCOSE, CAPILLARY
Glucose-Capillary: 192 mg/dL — ABNORMAL HIGH (ref 70–99)
Glucose-Capillary: 180 mg/dL — ABNORMAL HIGH (ref 70–99)
Glucose-Capillary: 212 mg/dL — ABNORMAL HIGH (ref 70–99)
Glucose-Capillary: 192 mg/dL — ABNORMAL HIGH (ref 70–99)
Glucose-Capillary: 205 mg/dL — ABNORMAL HIGH (ref 70–99)

## 2020-06-29 LAB — DIFFERENTIAL
Abs Immature Granulocytes: 0.07 10*3/uL (ref 0.00–0.07)
Basophils Absolute: 0 10*3/uL (ref 0.0–0.1)
Basophils Relative: 0 %
Eosinophils Absolute: 0 10*3/uL (ref 0.0–0.5)
Eosinophils Relative: 0 %
Immature Granulocytes: 1 %
Lymphocytes Relative: 5 %
Lymphs Abs: 0.4 10*3/uL — ABNORMAL LOW (ref 0.7–4.0)
Monocytes Absolute: 0.6 10*3/uL (ref 0.1–1.0)
Monocytes Relative: 8 %
Neutro Abs: 6.4 10*3/uL (ref 1.7–7.7)
Neutrophils Relative %: 86 %

## 2020-06-29 LAB — COMPREHENSIVE METABOLIC PANEL
ALT: 13 U/L (ref 0–44)
AST: 14 U/L — ABNORMAL LOW (ref 15–41)
Albumin: 2.1 g/dL — ABNORMAL LOW (ref 3.5–5.0)
Alkaline Phosphatase: 150 U/L — ABNORMAL HIGH (ref 38–126)
Anion gap: 14 (ref 5–15)
BUN: 62 mg/dL — ABNORMAL HIGH (ref 8–23)
CO2: 29 mmol/L (ref 22–32)
Calcium: 7.1 mg/dL — ABNORMAL LOW (ref 8.9–10.3)
Chloride: 92 mmol/L — ABNORMAL LOW (ref 98–111)
Creatinine, Ser: 4.94 mg/dL — ABNORMAL HIGH (ref 0.44–1.00)
GFR calc Af Amer: 9 mL/min — ABNORMAL LOW (ref >60)
GFR calc non Af Amer: 8 mL/min — ABNORMAL LOW (ref >60)
Glucose, Bld: 197 mg/dL — ABNORMAL HIGH (ref 70–99)
Potassium: 2.5 mmol/L — CL (ref 3.5–5.1)
Sodium: 135 mmol/L (ref 135–145)
Total Bilirubin: 0.9 mg/dL (ref 0.3–1.2)
Total Protein: 4.6 g/dL — ABNORMAL LOW (ref 6.5–8.1)

## 2020-06-29 LAB — TRIGLYCERIDES: Triglycerides: 93 mg/dL (ref <150)

## 2020-06-29 LAB — BASIC METABOLIC PANEL
Anion gap: 15 (ref 5–15)
BUN: 60 mg/dL — ABNORMAL HIGH (ref 8–23)
CO2: 27 mmol/L (ref 22–32)
Calcium: 7.3 mg/dL — ABNORMAL LOW (ref 8.9–10.3)
Chloride: 93 mmol/L — ABNORMAL LOW (ref 98–111)
Creatinine, Ser: 4.8 mg/dL — ABNORMAL HIGH (ref 0.44–1.00)
GFR calc Af Amer: 9 mL/min — ABNORMAL LOW (ref >60)
GFR calc non Af Amer: 8 mL/min — ABNORMAL LOW (ref >60)
Glucose, Bld: 223 mg/dL — ABNORMAL HIGH (ref 70–99)
Potassium: 3.1 mmol/L — ABNORMAL LOW (ref 3.5–5.1)
Sodium: 135 mmol/L (ref 135–145)

## 2020-06-29 LAB — HEPATITIS B SURFACE ANTIBODY, QUANTITATIVE: Hep B S AB Quant (Post): <3.1 m[IU]/mL — ABNORMAL LOW (ref >9.9)

## 2020-06-29 LAB — MAGNESIUM
Magnesium: 1.8 mg/dL (ref 1.7–2.4)
Magnesium: 2.1 mg/dL (ref 1.7–2.4)

## 2020-06-29 LAB — PHOSPHORUS: Phosphorus: 6.5 mg/dL — ABNORMAL HIGH (ref 2.5–4.6)

## 2020-06-29 LAB — POTASSIUM: Potassium: 3.2 mmol/L — ABNORMAL LOW (ref 3.5–5.1)

## 2020-06-29 MED ORDER — SODIUM CHLORIDE 0.9 % IV SOLN
2.0000 g | INTRAVENOUS | Status: AC
  Administered 2020-06-30: 2 g via INTRAVENOUS
  Filled 2020-06-29: qty 2

## 2020-06-29 MED ORDER — INSULIN ASPART 100 UNIT/ML ~~LOC~~ SOLN
0.0000 [IU] | Freq: Four times a day (QID) | SUBCUTANEOUS | Status: DC
  Administered 2020-06-29: 2 [IU] via SUBCUTANEOUS
  Administered 2020-06-29: 17:00:00 3 [IU] via SUBCUTANEOUS
  Administered 2020-06-30: 5 [IU] via SUBCUTANEOUS
  Administered 2020-06-30: 7 [IU] via SUBCUTANEOUS
  Administered 2020-06-30: 5 [IU] via SUBCUTANEOUS
  Administered 2020-06-30 – 2020-07-01 (×3): 7 [IU] via SUBCUTANEOUS
  Filled 2020-06-29 (×9): qty 1

## 2020-06-29 MED ORDER — MAGNESIUM SULFATE 2 GM/50ML IV SOLN
2.0000 g | Freq: Once | INTRAVENOUS | Status: AC
  Administered 2020-06-29: 2 g via INTRAVENOUS
  Filled 2020-06-29: qty 50

## 2020-06-29 MED ORDER — TRACE MINERALS CU-MN-SE-ZN 300-55-60-3000 MCG/ML IV SOLN
INTRAVENOUS | Status: AC
  Filled 2020-06-29: qty 422.4

## 2020-06-29 MED ORDER — POTASSIUM CHLORIDE 10 MEQ/100ML IV SOLN
10.0000 meq | INTRAVENOUS | Status: AC
  Administered 2020-06-29 (×6): 10 meq via INTRAVENOUS
  Filled 2020-06-29: qty 100

## 2020-06-29 MED ORDER — POTASSIUM CHLORIDE 10 MEQ/100ML IV SOLN
10.0000 meq | INTRAVENOUS | Status: AC
  Administered 2020-06-29 – 2020-06-30 (×6): 10 meq via INTRAVENOUS
  Filled 2020-06-29 (×5): qty 100

## 2020-06-29 NOTE — Psychosocial Assessment (Signed)
Pt bleeding moderate amounts of bright red blood from her temporary dialysis port; she bleed through her gown and linen. I have consulted the IV team and made provider aware.  Order has been placed to discontinue Heparin drip.

## 2020-06-29 NOTE — Consult Note (Signed)
PHARMACY - TOTAL PARENTERAL NUTRITION CONSULT NOTE   Indication: Small bowel obstruction  Patient Measurements: Height: 5\' 6"  (167.6 cm) Weight: 86.8 kg (191 lb 6.4 oz) IBW/kg (Calculated) : 59.3 TPN AdjBW (KG): 64.5 Body mass index is 30.89 kg/m. 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  Since admission her renal function worsened and she is now requiring hemodialysis, Currently with with combination of paralytic ileus secondary to electrolyte derangements (improved) and partial SBO given previous abdominal surgeries and CT findings on 09/24  Glucose / Insulin:  BG last 24 hours: 131 - 197, required 10 units SSI A1C 8.0 Electrolytes: hypokalemia, hyperphosphatemia Renal: worsening since admission s/p 1st HD session 9/28 LFTs / TGs: LFTs wnl -  baseline TG 93 Prealbumin / albumin: albumin = 2.7 Intake / Output; MIVF:  I/O net 126.7 - no MIVF GI Imaging:  9/29 Abd xray - stable small bowel dilation noted concerning for distal SBO Surgeries / Procedures:  9/18  right ureteral stent placement possible exploratory laparotomy 9/30 Central access: 06/27/20 - CVC triple lumen R internal jugular TPN start date: 06/28/20  Nutritional Goals (per RD recommendation on 9/28): Kcal: 1900-2100 Protein: 95-105 grams Fluid: UOP + 1 L Goal tpn rate is dependent on start of dialyses today and fluid status - pharmacy will monitor and adjust pending results of today's dialyses treatment  Current Nutrition:  NPO   Plan:   Increase TPN rate to 72mL/hr at 1800   Nutritional components: amino acids: 44 g/L (63.4g), dextrose: 15.4% (221.8g), lipids 30 g/L (43.2g), kCal: 1439  Total volume: 1540 mL  Electrolytes in TPN: standard electrolytes  except 35mmol/L of Phos, Cl:Ac ratio 1:1    KCL 19meq IV x 6   Add standard MVI and trace elements to TPN (holding chromium)  Adjust sensitive SSI to q6h and adjust as needed   Monitor TPN labs on Mon/Thurs, daily until stable  Dallie Piles, PharmD, BCPS Clinical Pharmacist 06/29/2020 11:47 AM

## 2020-06-29 NOTE — Progress Notes (Signed)
Physical Therapy Treatment Patient Details Name: Stephanie [name] MRN: [id number] DOB: May 29, 1941 Today's Date: 06/29/2020    History of Present Illness Stephanie [name] is a 38yoF c PMH: COVID infection, DM, RA, HTN, remote colon CA, who comes to [facility] on 9/17.    PT Comments    Pt presents in bed with slightly raised HOB. Pt is agreeable to perform supine exercises only due to low K+ level and temporary femoral catheter. Pt notes only pain is R wrist/hand that is swollen and red. She also has bruises along that same forearm, and a couple of bruises on L arm as well. Pt performed LE and UE exercises with instructions to perform throughout her day. Three exercises written on white board to help her remember and pt is agreeable to them. Pt will benefit from skilled PT services to address deficits in strength, balance, and decrease risk for future falls.   Follow Up Recommendations  SNF;Supervision for mobility/OOB;Supervision - Intermittent     Equipment Recommendations  None recommended by PT    Recommendations for Other Services       Precautions / Restrictions Precautions Precautions: Fall Precaution Comments: Pt has temp R femoral catheter Restrictions Weight Bearing Restrictions: No    Mobility  Bed Mobility               General bed mobility comments: not performed as pt with R temporary fem-cath  Transfers                 General transfer comment: Not performed due to R temporary fem-cath  Ambulation/Gait             General Gait Details: Not performed due to temp R fem-cath   Stairs             Wheelchair Mobility    Modified Rankin (Stroke Patients Only)       Balance Overall balance assessment: Needs assistance     Sitting balance - Comments: Did not assess sitting balance this session       Standing balance comment: Did not asses standing balance this session                            Cognition Arousal/Alertness:  Awake/alert Behavior During Therapy: WFL for tasks assessed/performed Overall Cognitive Status: Within Functional Limits for tasks assessed                                 General Comments: some increased processing time, somewhat HOH. Appropriately follows commands/cues.      Exercises General Exercises - Upper Extremity Shoulder Flexion: AROM;Both;10 reps;Supine Elbow Flexion: Strengthening;Left;10 reps;Supine (Light manual resistance c RUE; AROM with R UE) Elbow Extension: Strengthening;Both;10 reps;Supine (light manual resistance) General Exercises - Lower Extremity Ankle Circles/Pumps: AROM;Both;10 reps;Supine Quad Sets: AROM;Both;10 reps;Supine Gluteal Sets: AROM;Both;10 reps;Supine Other Exercises Other Exercises: Scap squeezes x 10    General Comments General comments (skin integrity, edema, etc.): Pt's R wrist swollen and red with bruises along her R forearm; some bruises on her L arm      Pertinent Vitals/Pain Pain Assessment: Faces Faces Pain Scale: Hurts even more Pain Location: R hand/wrist Pain Descriptors / Indicators: Sore Pain Intervention(s): Monitored during session    Home Living                      Prior Function  PT Goals (current goals can now be found in the care plan section) Acute Rehab PT Goals Patient Stated Goal: go home PT Goal Formulation: With patient Time For Goal Achievement: 07/07/20 Potential to Achieve Goals: Fair Progress towards PT goals: Progressing toward goals    Frequency    Min 2X/week      PT Plan Current plan remains appropriate    Co-evaluation              AM-PAC PT "6 Clicks" Mobility   Outcome Measure  Help needed turning from your back to your side while in a flat bed without using bedrails?: A Lot Help needed moving from lying on your back to sitting on the side of a flat bed without using bedrails?: A Lot Help needed moving to and from a bed to a chair (including  a wheelchair)?: Total Help needed standing up from a chair using your arms (e.g., wheelchair or bedside chair)?: Total Help needed to walk in hospital room?: Total Help needed climbing 3-5 steps with a railing? : Total 6 Click Score: 8    End of Session Equipment Utilized During Treatment: Oxygen Activity Tolerance: Patient tolerated treatment well Patient left: in bed;with call bell/phone within reach;with bed alarm set Nurse Communication: Mobility status PT Visit Diagnosis: Other abnormalities of gait and mobility (R26.89);Muscle weakness (generalized) (M62.81);Dizziness and giddiness (R42);Difficulty in walking, not elsewhere classified (R26.2)     Time: 8882-8003 PT Time Calculation (min) (ACUTE ONLY): 16 min  Charges:                          Noemi Chapel, SPT Bernita Raisin 06/29/2020, 11:21 AM

## 2020-06-29 NOTE — Progress Notes (Signed)
Heparin drip stopped this a.m. per MD order.

## 2020-06-29 NOTE — Progress Notes (Signed)
[facility] SURGICAL PROGRESS NOTE (cpt [id number])  Hospital Day(s): 12.   Post op day(s): 2 Days Post-Op.   Interval History: Patient seen and examined, no acute events or new complaints overnight. Patient reports she is feeling a little better, she has started to have flatus, denies nausea/emesis. CBC without leukocytosis. Still with significant hypokalemia to 2.5 this morning. Hypocalcemia mildly improved to 7.1 (ionized calcium on 09/24 low at 2.5). Renal function somewhat improved, sCr - 4.94. Hyperphosphatemia improved as well 6.5.  NGT output not recorded, but she has had multiple BMs in the last 24 hours recorded.   Review of Systems:  Constitutional: denies fever, chills  HEENT: denies cough or congestion  Respiratory: denies any shortness of breath  Cardiovascular: denies chest pain or palpitations  Gastrointestinal: + abdominal pain (improved), denied N/V, or diarrhea/and bowel function as per interval history Genitourinary: denies burning with urination or urinary frequency Musculoskeletal: denies pain, decreased motor or sensation  Vital signs in last 24 hours: [min-max] current  Temp:  [97.6 F (36.4 C)-98.3 F (36.8 C)] 97.6 F (36.4 C) (09/29 0525) Pulse Rate:  [70-115] 79 (09/29 0525) Resp:  [12-22] 20 (09/29 0525) BP: (126-168)/(29-74) 151/51 (09/29 0100) SpO2:  [91 %-98 %] 95 % (09/29 0525)     Height: 5\' 6"  (167.6 cm) Weight: 86.8 kg BMI (Calculated): 30.91   Intake/Output last 2 shifts:  09/28 0701 - 09/29 0700 In: 1226.7 [I.V.:1226.7] Out: 0    Physical Exam:  Constitutional: alert, cooperative and no distress  HENT: normocephalic without obvious abnormality, NGT in place Eyes: PERRL, EOM's grossly intact and symmetric  Respiratory: breathing non-labored at rest  Cardiovascular: regular rate and sinus rhythm  Gastrointestinal: Soft, very mild generalized tenderness this morning, distension appears improved, certainly no  rebound/guarding/peritonitis Musculoskeletal: no edema or wounds, motor and sensation grossly intact, NT    Labs:  CBC Latest Ref Rng & Units 06/29/2020 06/28/2020 06/27/2020  WBC 4.0 - 10.5 K/uL 7.5 6.2 5.3  Hemoglobin 12.0 - 15.0 g/dL 8.2(L) 8.8(L) 9.2(L)  Hematocrit 36 - 46 % 23.2(L) 24.7(L) 24.6(L)  Platelets 150 - 400 K/uL 136(L) 155 157   CMP Latest Ref Rng & Units 06/28/2020 06/28/2020 06/26/2020  Glucose 70 - 99 mg/dL - 131(H) 186(H)  BUN 8 - 23 mg/dL - 91(H) 98(H)  Creatinine 0.44 - 1.00 mg/dL - 6.33(H) 6.14(H)  Sodium 135 - 145 mmol/L - 133(L) 127(L)  Potassium 3.5 - 5.1 mmol/L 3.0(L) 2.5(LL) 3.4(L)  Chloride 98 - 111 mmol/L - 87(L) 90(L)  CO2 22 - 32 mmol/L - 27 20(L)  Calcium 8.9 - 10.3 mg/dL - 6.5(L) 6.4(LL)  Total Protein 6.5 - 8.1 g/dL - - 5.0(L)  Total Bilirubin 0.3 - 1.2 mg/dL - - 0.9  Alkaline Phos 38 - 126 U/L - - 179(H)  AST 15 - 41 U/L - - 18  ALT 0 - 44 U/L - - 14     Imaging studies:   KUB (06/29/2020) personally reviewed showing persistently dilated loops of bowel, there does appear to be gas/stool in ascending colon, and radiologist report reviewed:  IMPRESSION: Stable small bowel dilatation is noted concerning for distal small bowel obstruction.   Assessment/Plan: (ICD-10's: K44.609) 79 y.o. female with combination of paralytic ileus secondary to electrolyte derangements (improved) and partial SBO given previous abdominal surgeries and CT findings on 09/24, but she is showing signs of clinical improvement this morning, complicated by pertinent comorbidities including debilitation, numerous medical conditions now with ESRD requiring HD   - Patient  with some clinical signs of bowel function return however remains with markedly distended loops on KUB. She may require surgical intervention in the next 48 hours   - Monitor abdominal examination; on-going bowel function             - Mobilize as feasible - Further management per primary service; we  will follow    All of the above findings and recommendations were discussed with the patient, and the medical team, and all of patient's questions were answered to her expressed satisfaction.  -- Edison Simon, PA-C  [facility] 06/29/2020, 7:12 AM [id number] M-F: 7am - 4pm

## 2020-06-29 NOTE — Progress Notes (Addendum)
[facility]  ROUNDING NOTE   Subjective:   Stephanie [name] is a 79 y.o. female who presents with small bowel obstruction associated with COVID-19.  She has now progressed to the point where her renal insufficiency has worsened acutely and she requires hemodialysis  Patient awake, and alert today. NGT is with light greenish output. Patient still has mild abdominal tenderness, otherwise no complaints.  Objective:  Vital signs in last 24 hours:  Temp:  [97.6 F (36.4 C)-98.3 F (36.8 C)] 97.7 F (36.5 C) (09/29 0700) Pulse Rate:  [70-86] 86 (09/29 0700) Resp:  [12-22] 20 (09/29 0700) BP: (129-168)/(29-68) 140/49 (09/29 0700) SpO2:  [91 %-98 %] 95 % (09/29 0525)  Weight change:  Filed Weights   06/19/20 0625 06/21/20 0125 06/22/20 0453  Weight: 80.4 kg 82.2 kg 86.8 kg    Intake/Output: I/O last 3 completed shifts: In: 2319.4 [I.V.:2319.4] Out: 1900 [Urine:1900]   Intake/Output this shift:  No intake/output data recorded.  Physical Exam: General: Resting in bed, relaxed  Head: Normocephalic, atraumatic.NGT in place  Eyes: Anicteric  Neck: Supple  Lungs:  Diminished at the bases  Heart: Regular rate and rhythm  Abdomen:  Mild distension +, tenderness +  Extremities:  No peripheral edema.  Neurologic: Nonfocal, moving all four extremities  Skin: No new rashes or lesions  Access: Rt IJ temp dialysis  catheter placed on 4/00/86    Basic Metabolic Panel: Recent Labs  Lab 06/23/20 0534 06/23/20 0534 06/24/20 0610 06/24/20 0610 06/25/20 0731 06/25/20 0731 06/26/20 0510 06/28/20 0530 06/28/20 1222 06/29/20 0645  NA 126*   < > 124*  --  124*  --  127* 133*  --  135  K 4.6   < > 4.4   < > 4.0  --  3.4* 2.5* 3.0* 2.5*  CL 100   < > 98  --  95*  --  90* 87*  --  92*  CO2 12*   < > 12*  --  14*  --  20* 27  --  29  GLUCOSE 132*   < > 129*  --  179*  --  186* 131*  --  197*  BUN 65*   < > 87*  --  94*  --  98* 91*  --  62*  CREATININE 4.77*   < > 5.39*  --   5.96*  --  6.14* 6.33*  --  4.94*  CALCIUM 6.4*   < > 6.4*   < > 6.5*   < > 6.4* 6.5*  --  7.1*  MG 1.7  --  1.5*  --  1.9  --   --  1.8  --  1.8  PHOS  --   --   --   --   --   --   --  9.3*  --  6.5*   < > = values in this interval not displayed.    Liver Function Tests: Recent Labs  Lab 06/23/20 0649 06/26/20 0510 06/29/20 0645  AST  --  18 14*  ALT  --  14 13  ALKPHOS  --  179* 150*  BILITOT  --  0.9 0.9  PROT  --  5.0* 4.6*  ALBUMIN 1.7* 2.7* 2.1*   No results for input(s): LIPASE, AMYLASE in the last 168 hours. No results for input(s): AMMONIA in the last 168 hours.  CBC: Recent Labs  Lab 06/25/20 0731 06/26/20 0510 06/27/20 0545 06/28/20 0530 06/29/20 0645  WBC 11.3* 6.7 5.3  6.2 7.5  NEUTROABS  --   --   --   --  6.4  HGB 9.4* 8.9* 9.2* 8.8* 8.2*  HCT 25.4* 24.0* 24.6* 24.7* 23.2*  MCV 74.3* 72.9* 73.2* 76.5* 77.1*  PLT 184 184 157 155 136*    Cardiac Enzymes: No results for input(s): CKTOTAL, CKMB, CKMBINDEX, TROPONINI in the last 168 hours.  BNP: Invalid input(s): POCBNP  CBG: Recent Labs  Lab 06/28/20 1604 06/28/20 1939 06/29/20 0225 06/29/20 0527 06/29/20 0814  GLUCAP 144* 137* 192* 180* 212*    Microbiology: Results for orders placed or performed during the hospital encounter of 06/17/20  Blood culture (routine x 2)     Status: Abnormal   Collection Time: 06/17/20  3:57 AM   Specimen: BLOOD  Result Value Ref Range Status   Specimen Description   Final    BLOOD LEFT HAND Performed at [facility], [address] [place], [place] [address]    Special Requests   Final    BOTTLES DRAWN AEROBIC AND ANAEROBIC Blood Culture adequate volume Performed at [facility], [address] [place], [place] [address]    Culture  Setup Time   Final    Organism ID to follow GRAM NEGATIVE RODS IN BOTH AEROBIC AND ANAEROBIC BOTTLES CRITICAL RESULT CALLED TO, READ BACK BY AND VERIFIED WITH: AMY THOMPSON AT 1534 ON 06/17/20  [place] Performed at [facility], [address] [place], [place] [address]    Culture KLEBSIELLA PNEUMONIAE (A)  Final   Report Status 06/19/2020 FINAL  Final   Organism ID, Bacteria KLEBSIELLA PNEUMONIAE  Final      Susceptibility   Klebsiella pneumoniae - MIC*    AMPICILLIN RESISTANT Resistant     CEFAZOLIN <=4 SENSITIVE Sensitive     CEFEPIME <=0.12 SENSITIVE Sensitive     CEFTAZIDIME <=1 SENSITIVE Sensitive     CEFTRIAXONE <=0.25 SENSITIVE Sensitive     CIPROFLOXACIN <=0.25 SENSITIVE Sensitive     GENTAMICIN <=1 SENSITIVE Sensitive     IMIPENEM <=0.25 SENSITIVE Sensitive     TRIMETH/SULFA <=20 SENSITIVE Sensitive     AMPICILLIN/SULBACTAM 4 SENSITIVE Sensitive     PIP/TAZO <=4 SENSITIVE Sensitive     * KLEBSIELLA PNEUMONIAE  Blood culture (routine x 2)     Status: Abnormal   Collection Time: 06/17/20  3:57 AM   Specimen: BLOOD  Result Value Ref Range Status   Specimen Description   Final    BLOOD RIGHT [facility] Performed at [facility], [address] [place], [place] [address]    Special Requests   Final    BOTTLES DRAWN AEROBIC AND ANAEROBIC Blood Culture adequate volume Performed at [facility], [address] [place], [place] [address]    Culture  Setup Time   Final    GRAM NEGATIVE RODS IN BOTH AEROBIC AND ANAEROBIC BOTTLES CRITICAL VALUE NOTED.  VALUE IS CONSISTENT WITH PREVIOUSLY REPORTED AND CALLED VALUE. Performed at [facility], [address] [place], [place] [address]    Culture (A)  Final    KLEBSIELLA PNEUMONIAE SUSCEPTIBILITIES PERFORMED ON PREVIOUS CULTURE WITHIN THE LAST 5 DAYS. Performed at [facility], [place] [address] [place], [place] [address]    Report Status 06/19/2020 FINAL  Final  Blood Culture ID Panel (Reflexed)     Status: Abnormal   Collection Time: 06/17/20  3:57 AM  Result Value Ref Range Status   Enterococcus faecalis NOT DETECTED NOT DETECTED Final   Enterococcus Faecium NOT DETECTED  NOT  DETECTED Final   Listeria monocytogenes NOT DETECTED NOT DETECTED Final   Staphylococcus species NOT DETECTED NOT DETECTED Final   Staphylococcus aureus (BCID) NOT DETECTED NOT DETECTED Final   Staphylococcus epidermidis NOT DETECTED NOT DETECTED Final   Staphylococcus lugdunensis NOT DETECTED NOT DETECTED Final   Streptococcus species NOT DETECTED NOT DETECTED Final   Streptococcus agalactiae NOT DETECTED NOT DETECTED Final   Streptococcus pneumoniae NOT DETECTED NOT DETECTED Final   Streptococcus pyogenes NOT DETECTED NOT DETECTED Final   A.calcoaceticus-baumannii NOT DETECTED NOT DETECTED Final   Bacteroides fragilis NOT DETECTED NOT DETECTED Final   Enterobacterales DETECTED (A) NOT DETECTED Final    Comment: Enterobacterales represent a large order of gram negative bacteria, not a single organism. CRITICAL RESULT CALLED TO, READ BACK BY AND VERIFIED WITH: AMY THOMPSON AT 2025 ON 06/17/20 SNG    Enterobacter cloacae complex NOT DETECTED NOT DETECTED Final   Escherichia coli NOT DETECTED NOT DETECTED Final   Klebsiella aerogenes NOT DETECTED NOT DETECTED Final   Klebsiella oxytoca NOT DETECTED NOT DETECTED Final   Klebsiella pneumoniae DETECTED (A) NOT DETECTED Final    Comment: CRITICAL RESULT CALLED TO, READ BACK BY AND VERIFIED WITH: AMY THOMPSON RN AT 4270 ON 06/17/20 SNG    Proteus species NOT DETECTED NOT DETECTED Final   Salmonella species NOT DETECTED NOT DETECTED Final   Serratia marcescens NOT DETECTED NOT DETECTED Final   Haemophilus influenzae NOT DETECTED NOT DETECTED Final   Neisseria meningitidis NOT DETECTED NOT DETECTED Final   Pseudomonas aeruginosa NOT DETECTED NOT DETECTED Final   Stenotrophomonas maltophilia NOT DETECTED NOT DETECTED Final   Candida albicans NOT DETECTED NOT DETECTED Final   Candida auris NOT DETECTED NOT DETECTED Final   Candida glabrata NOT DETECTED NOT DETECTED Final   Candida krusei NOT DETECTED NOT DETECTED Final   Candida parapsilosis  NOT DETECTED NOT DETECTED Final   Candida tropicalis NOT DETECTED NOT DETECTED Final   Cryptococcus neoformans/gattii NOT DETECTED NOT DETECTED Final   CTX-M ESBL NOT DETECTED NOT DETECTED Final   Carbapenem resistance IMP NOT DETECTED NOT DETECTED Final   Carbapenem resistance KPC NOT DETECTED NOT DETECTED Final   Carbapenem resistance NDM NOT DETECTED NOT DETECTED Final   Carbapenem resist OXA 48 LIKE NOT DETECTED NOT DETECTED Final   Carbapenem resistance VIM NOT DETECTED NOT DETECTED Final    Comment: Performed at [facility], [address], [place], [place] [address]  Urine Culture     Status: Abnormal   Collection Time: 06/17/20 11:01 AM   Specimen: Urine, Random  Result Value Ref Range Status   Specimen Description   Final    URINE, RANDOM Performed at [facility], [address], [place], [place] [address]    Special Requests   Final    NONE Performed at [facility], [address] [place], [place] [address]    Culture >=100,000 COLONIES/mL KLEBSIELLA PNEUMONIAE (A)  Final   Report Status 06/19/2020 FINAL  Final   Organism ID, Bacteria KLEBSIELLA PNEUMONIAE (A)  Final      Susceptibility   Klebsiella pneumoniae - MIC*    AMPICILLIN RESISTANT Resistant     CEFAZOLIN <=4 SENSITIVE Sensitive     CEFTRIAXONE <=0.25 SENSITIVE Sensitive     CIPROFLOXACIN <=0.25 SENSITIVE Sensitive     GENTAMICIN <=1 SENSITIVE Sensitive     IMIPENEM <=0.25 SENSITIVE Sensitive     NITROFURANTOIN 32 SENSITIVE Sensitive     TRIMETH/SULFA <=20 SENSITIVE Sensitive  AMPICILLIN/SULBACTAM 4 SENSITIVE Sensitive     PIP/TAZO <=4 SENSITIVE Sensitive     * >=100,000 COLONIES/mL KLEBSIELLA PNEUMONIAE  Culture, blood (Routine X 2) w Reflex to ID Panel     Status: None   Collection Time: 06/21/20 11:07 AM   Specimen: BLOOD  Result Value Ref Range Status   Specimen Description BLOOD RIGHT [facility]  Final   Special Requests   Final    BOTTLES DRAWN AEROBIC AND ANAEROBIC  Blood Culture adequate volume   Culture   Final    NO GROWTH 5 DAYS Performed at [facility], [address] [place], [place] [address]    Report Status 06/26/2020 FINAL  Final  Culture, blood (Routine X 2) w Reflex to ID Panel     Status: None   Collection Time: 06/21/20 11:09 AM   Specimen: BLOOD  Result Value Ref Range Status   Specimen Description BLOOD [place]  Final   Special Requests   Final    BOTTLES DRAWN AEROBIC AND ANAEROBIC Blood Culture adequate volume   Culture   Final    NO GROWTH 5 DAYS Performed at [facility], [address] [place], [place] [address]    Report Status 06/26/2020 FINAL  Final    Coagulation Studies: Recent Labs    06/27/20 0545  LABPROT 18.6*  INR 1.6*    Urinalysis: No results for input(s): COLORURINE, LABSPEC, PHURINE, GLUCOSEU, HGBUR, BILIRUBINUR, KETONESUR, PROTEINUR, UROBILINOGEN, NITRITE, LEUKOCYTESUR in the last 72 hours.  Invalid input(s): APPERANCEUR    Imaging: X-ray chest PA or AP  Result Date: 06/27/2020 CLINICAL DATA:  Status post PICC line placement.  COVID. EXAM: CHEST  1 VIEW COMPARISON:  CT chest 06/17/2020.  Chest x-ray 06/17/2020. FINDINGS: NG tube noted with tip below left hemidiaphragm. Right IJ line noted with tip over right atrium. No PICC line noted. Heart size normal. Left base atelectasis/infiltrate small left pleural effusion. Mild right mid lung subsegmental atelectasis. Prominent skin fold on the left. No pneumothorax. Heart size normal. Prior thoracolumbar spine fusion. IMPRESSION: 1. NG tube noted with tip below left hemidiaphragm. Right IJ line noted with tip over the right atrium. No PICC line noted. 2. Left base atelectasis/infiltrate and small left pleural effusion. Mild right mid lung subsegmental atelectasis. Electronically Signed   By: Marcello Moores  Register   On: 06/27/2020 15:30   PERIPHERAL VASCULAR CATHETERIZATION  Result Date: 06/28/2020 See op note  DG Abd Portable 1V  Result  Date: 06/29/2020 CLINICAL DATA:  Small bowel obstruction. EXAM: PORTABLE ABDOMEN - 1 VIEW COMPARISON:  June 27, 2020. FINDINGS: Nasogastric tube tip is seen in the distal stomach. Stable small bowel dilatation is noted concerning for distal small bowel obstruction. Postsurgical changes are seen involving the lumbar spine and sacrum. Right ureteral stent is noted. IMPRESSION: Stable small bowel dilatation is noted concerning for distal small bowel obstruction. Electronically Signed   By: Marijo Conception M.D.   On: 06/29/2020 08:53     Medications:   . sodium chloride Stopped (06/27/20 1630)  . magnesium sulfate bolus IVPB 2 g (06/29/20 0912)  . potassium chloride 10 mEq (06/29/20 0907)  . sodium bicarbonate (isotonic) 150 mEq in D5W 1000 mL infusion 30 mL/hr at 06/29/20 0656  . TPN ADULT (ION) 30 mL/hr at 06/29/20 0600   . vitamin C  500 mg Oral Daily  . brimonidine  1 drop Both Eyes BID  . chlorhexidine  15 mL Mouth Rinse BID  . Chlorhexidine Gluconate Cloth  6 each Topical  Q0600  . hydrALAZINE  10 mg Intravenous Q6H  . insulin aspart  0-9 Units Subcutaneous Q4H  . mouth rinse  15 mL Mouth Rinse BID  . metoprolol tartrate  5 mg Intravenous Q6H  . pantoprazole (PROTONIX) IV  40 mg Intravenous Q24H  . sodium chloride flush  10-40 mL Intracatheter Q12H  . sodium chloride flush  3 mL Intravenous Q12H  . tamsulosin  0.4 mg Oral Daily  . zinc sulfate  220 mg Oral Daily   sodium chloride, acetaminophen, albuterol, guaiFENesin-dextromethorphan, heparin, morphine injection, ondansetron (ZOFRAN) IV, sodium chloride flush  Assessment/ Plan:  Ms. Stephanie [name] is a 79 y.o.  female admitted to [facility] for COVID pneumonia. Patient found to have klebsiella sepsis and had right sided pyelonephritis. She underwent right ureteral stent placed on on 9/18.  Patient had IV contrast exposure on 9/17.   1) AKI secondary to ATN Patient has AKI secondary to multiple factors Patient has AKI secondary to  sepsis/obstructive uropathy/did have IV contrast exposure/hypotension. BASELINE Cr 0.90 in January 2021 Received dialysis yesterday (1st treatment 06/28/2020) Creatinine today is 4.94, trending down Potasssium is low but improved,  UOP is satisfactory  Dialysis not required today Will assess need for dialysis daily  2)HTN Blood pressure readings slightly above the goal  3)Anemia of renal failure Lab Results  Component Value Date   HGB 8.2 (L) 06/29/2020   Will continue monitoring Consider Epogen starting with next HD  4) hyponatremia Hyponatremia Sodium upto 135 today  5) obstructive uropathy with pyelonephritis Patient is status post right ureteral stent placement by Dr. Jeffie Pollock on September 18 Urine Output adequate  6. Hypokalemia Aggressive iv replacement in progress.   7. Covid -19  Pneumonia Management as per icu team      LOS: [place] 9/29/20219:23 AM

## 2020-06-29 NOTE — Progress Notes (Signed)
Patients blood pressure 145/49.Marland KitchenMarland KitchenMarland KitchenI asked MD if he still  wanted me to still give the morning hydralazine. MD stated yes.

## 2020-06-29 NOTE — Progress Notes (Signed)
Stephanie [name] with LAB called and reported a critical K+ 2.5. Made MD aware, new orders for 6 run of K+ and IV Magnesium x1.

## 2020-06-29 NOTE — Progress Notes (Signed)
PROGRESS NOTE    AUJANAE [name]  OBS:[id number] DOB: 04-27-1941 DOA: 06/17/2020 PCP: Rusty Aus, MD    Brief Narrative:  Stephanie [name] is a 79 year old female with past medical history notable for type 2 diabetes mellitus, rheumatoid arthritis on chronic immunosuppressive therapy, essential hypertension, history of remote colon cancer and Covid-19 viral infection with positive PCR test June 09, 2020 who presented to the ED with with complaint of fever, progressive shortness of breath, and generalized weakness.  In the ED, sodium 133, potassium 3.9, chloride 102, bicarb 18, glucose 344, BUN 38, creatinine 1.57, AST 27, ALT 21, troponin 61, lactic acid 3.1, procalcitonin 17, to BC count 20.4, hemoglobin 11.8, platelet 144.  CT angiogram chest negative for PE but with right hydronephrosis which has progressed since October 2020.  Chest x-ray with no acute cardiopulmonary disease process.  TRH consulted for admission secondary to sepsis from UTI/pyelonephritis with subsequent hydronephrosis.   Assessment & Plan:   Principal Problem:   Sepsis ([place]) Active Problems:   Benign essential hypertension   Chronic cystitis   Rheumatoid arthritis involving multiple sites with positive rheumatoid factor (HCC)   Diabetes mellitus (HCC)   Rheumatoid arthritis ([place])   COVID-19 virus infection   AKI (acute kidney injury) ([place])   Atrial fibrillation with rapid ventricular response (HCC)   Essential hypertension   Pure hypercholesterolemia   Acute pyelonephritis   Hydronephrosis, right   Lactic acid acidosis   Hyperglycemia   Hx of essential hypertension   Immunosuppression due to drug therapy   Pressure injury of skin   Acquired thrombophilia (HCC)   On continuous oral anticoagulation   SBO (small bowel obstruction) (HCC)   Goals of care, counseling/discussion   Palliative care by specialist   CKD (chronic kidney disease) stage 5, GFR less than 15 ml/min (HCC)   Ileus (HCC)   Paroxysmal  A-fib (HCC)   Sepsis, present on admission Klebsiella pneumonia septicemia, POA Right pyelonephritis Patient presenting with fever, T-max 103.0 with associated tachycardia, tachypnea with a WBC count of 20 K with a left shift and lactic acid of 3.1.  Underwent right ureter stent placement by urology with frank pus noted to kidney.  And blood cultures x2 positive for Klebsiella pneumonia. WBC count improved from 20K to 7.5.  Completed 10-day course of IV antibiotics with followed by ceftriaxone followed by ciprofloxacin.  Blood cultures 06/21/2020 negative. --Continue monitor fever curve, supportive care  Severe right hydronephrosis CT renal notable for progressive right UPJ obstruction in comparison to prior study dated October 2020.  Urology was consulted and right stent placed with findings of frank pus in the kidney by Dr. Jeffie Pollock on 06/18/2020.  Follow-up renal ultrasound 06/20/2020 with resolved hydronephrosis. --Continue Foley catheter per urology, Dr. Jeffie Pollock --Continue strict I's and O's  Acute renal failure Patient presenting above with severe right hydronephrosis with associated sepsis from Klebsiella pneumonia septicemia/pyelonephritis.  Baseline creatinine 0.90 January 2021. Creatinine 1.57 on admission, continue to trend up to a high of 6.33 during admission.  Etiology likely secondary to ATN from severe infection as above. --Nephrology following, appreciate assistance --Underwent placement of temporary HD catheter to right common femoral vein by vascular surgery on 06/27/2020.  --Started on dialysis 9/28; further per nephrology --Avoid nephrotoxins, renally dose all medications --Follow BMP daily  Hypokalemia Hypomagnesemia Potassium 2.5, magnesium 1.8 this morning.  Will replete. --Repeat potassium and magnesium this afternoon, if continues to be low will replete once again. --Follow electrolytes daily --On TPN  Small bowel obstruction  CT abdomen/pelvis with oral contrast notable  for high-grade small bowel obstruction with abrupt transition point central lower abdomen, likely secondary to adhesions. --Surgery following, appreciate assistance --Continues with NG tube to low intermittent wall suction --Continues on TPN; RIJ PICC placed 9/27 --repeat KUB this am with stable small bowel dilation concerning for distal small bowel obstruction. --General surgery planning likely operative management on 06/30/2020  Paroxysmal atrial fibrillation with RVR Currently in NSR on telemetry --Cardiology following, appreciate assistance --CHA2DS2-VASc score = 5 (HTN, age, T2DM, gender) --IV metoprolol 5 mg every 6 hours --Discontinued heparin drip 2/2 bleeding surrounding HD catheter and planned surgical invention on 06/30/2020 --continue to monitor on telemetry  GERD: Protonix 40 mg IV every 24 hours  Pressure injury of buttock, stage I Pressure Injury 06/18/20 Buttocks Left;Lower Stage 1 -  Intact skin with non-blanchable redness of a localized area usually over a bony prominence. (Active)  06/18/20 0600  Location: Buttocks  Location Orientation: Left;Lower  Staging: Stage 1 -  Intact skin with non-blanchable redness of a localized area usually over a bony prominence.  Wound Description (Comments):   Present on Admission: Yes  --Continue local wound care, offloading  Hx COVID-19 viral infection Patient fully vaccinated, received Pfizer vaccine on 10/16/2019 and 11/07/2019.  Covid-19 PCR positive on 06/09/2020.  No overt respiratory symptoms.  CT chest without acute pulmonary abnormality.  Oxygenating well on room air.  Completed 5-day course of IV remdesivir. --Given immunocompromised status, will continue 21 day airborne/contact isolation through 06/29/2020, then isolation can be discontinued on 06/30/2020   DVT prophylaxis: SCDs Code Status: DNR Family Communication: Updated patient extensively at bedside  Disposition Plan:  Status is: Inpatient  Remains inpatient  appropriate because:Persistent severe electrolyte disturbances, Ongoing active pain requiring inpatient pain management, Ongoing diagnostic testing needed not appropriate for outpatient work up, Unsafe d/c plan, IV treatments appropriate due to intensity of illness or inability to take PO and Inpatient level of care appropriate due to severity of illness   Dispo: The patient is from: Home              Anticipated d/c is to: SNF              Anticipated d/c date is: > 3 days              Patient currently is not medically stable to d/c.   Consultants:   Cardiology  General surgery  Nephrology  Urology  Palliative care  Procedures:   Right ureteral stent placement, Dr. Jeffie Pollock 9/19  Right femoral HD catheter placement, Dr. Delana Meyer 9/27  Started on HD 9/28  RIJ PICC 9/27  Antimicrobials:   Zosyn 9/19 - 9/23  Ceftriaxone 9/17 - 9/18  Ciprofloxacin 9/23 - 9/26  Azithromycin 9/17 - 9/17  Vancomycin 9/17 - 9/17  Cefepime 9/17 - 9/17    Subjective: Patient seen and examined bedside, resting comfortably.  Continues without flatus.  Continu es with abdominal distention with NG tube in place to intermittent low wall suction.  Overall feels weak, fatigued and deconditioned.  No other complaints or concerns at this time.  Denies headache, no visual changes, no chest pain, no palpitations, no shortness of breath, no fever/chills/night sweats.  No acute events overnight per nursing staff.  Objective: Vitals:   06/29/20 0525 06/29/20 0700 06/29/20 1152 06/29/20 1641  BP:  (!) 140/49 (!) 152/60 (!) 166/60  Pulse: 79 86 78 91  Resp: 20 20 15 17   Temp: 97.6 F (36.4 C) 97.7 F (  36.5 C) 97.8 F (36.6 C)   TempSrc: Oral     SpO2: 95%  95% 94%  Weight:      Height:        Intake/Output Summary (Last 24 hours) at 06/29/2020 1647 Last data filed at 06/29/2020 1530 Gross per 24 hour  Intake 2015.05 ml  Output 1100 ml  Net 915.05 ml   Filed Weights   06/19/20 0625 06/21/20  0125 06/22/20 0453  Weight: 80.4 kg 82.2 kg 86.8 kg    Examination:  General exam: Appears calm and comfortable, chronically ill in appearance Respiratory system: Clear to auscultation. Respiratory effort normal.  Oxygen well on room air with SPO2 95% Cardiovascular system: S1 & S2 heard, RRR. No JVD, murmurs, rubs, gallops or clicks. No pedal edema. Gastrointestinal system: Abdomen is soft, distended, mild generalized tenderness to palpation. No organomegaly or masses felt.  No bowel sounds appreciated Central nervous system: Alert and oriented. No focal neurological deficits. Extremities: Symmetric 5 x 5 power. Skin: pressure injury stage 1 buttock Psychiatry: Judgement and insight appear normal. Mood & affect appropriate.     Data Reviewed: I have personally reviewed following labs and imaging studies  CBC: Recent Labs  Lab 06/25/20 0731 06/26/20 0510 06/27/20 0545 06/28/20 0530 06/29/20 0645  WBC 11.3* 6.7 5.3 6.2 7.5  NEUTROABS  --   --   --   --  6.4  HGB 9.4* 8.9* 9.2* 8.8* 8.2*  HCT 25.4* 24.0* 24.6* 24.7* 23.2*  MCV 74.3* 72.9* 73.2* 76.5* 77.1*  PLT 184 184 157 155 412*   Basic Metabolic Panel: Recent Labs  Lab 06/23/20 0534 06/23/20 0534 06/24/20 0610 06/24/20 0610 06/25/20 0731 06/25/20 0731 06/26/20 0510 06/28/20 0530 06/28/20 1222 06/29/20 0645 06/29/20 1253  NA 126*   < > 124*   < > 124*  --  127* 133*  --  135 135  K 4.6   < > 4.4   < > 4.0   < > 3.4* 2.5* 3.0* 2.5* 3.1*  CL 100   < > 98   < > 95*  --  90* 87*  --  92* 93*  CO2 12*   < > 12*   < > 14*  --  20* 27  --  29 27  GLUCOSE 132*   < > 129*   < > 179*  --  186* 131*  --  197* 223*  BUN 65*   < > 87*   < > 94*  --  98* 91*  --  62* 60*  CREATININE 4.77*   < > 5.39*   < > 5.96*  --  6.14* 6.33*  --  4.94* 4.80*  CALCIUM 6.4*   < > 6.4*   < > 6.5*  --  6.4* 6.5*  --  7.1* 7.3*  MG 1.7  --  1.5*  --  1.9  --   --  1.8  --  1.8  --   PHOS  --   --   --   --   --   --   --  9.3*  --  6.5*  --     < > = values in this interval not displayed.   GFR: Estimated Creatinine Clearance: 10.9 mL/min (A) (by C-G formula based on SCr of 4.8 mg/dL (H)). Liver Function Tests: Recent Labs  Lab 06/23/20 0649 06/26/20 0510 06/29/20 0645  AST  --  18 14*  ALT  --  14 13  ALKPHOS  --  179* 150*  BILITOT  --  0.9 0.9  PROT  --  5.0* 4.6*  ALBUMIN 1.7* 2.7* 2.1*   No results for input(s): LIPASE, AMYLASE in the last 168 hours. No results for input(s): AMMONIA in the last 168 hours. Coagulation Profile: Recent Labs  Lab 06/24/20 1323 06/25/20 0731 06/26/20 0510 06/27/20 0545  INR 2.0* 2.1* 1.8* 1.6*   Cardiac Enzymes: No results for input(s): CKTOTAL, CKMB, CKMBINDEX, TROPONINI in the last 168 hours. BNP (last 3 results) No results for input(s): PROBNP in the last 8760 hours. HbA1C: No results for input(s): HGBA1C in the last 72 hours. CBG: Recent Labs  Lab 06/29/20 0225 06/29/20 0527 06/29/20 0814 06/29/20 1209 06/29/20 1646  GLUCAP 192* 180* 212* 192* 205*   Lipid Profile: Recent Labs    06/29/20 0654  TRIG 93   Thyroid Function Tests: No results for input(s): TSH, T4TOTAL, FREET4, T3FREE, THYROIDAB in the last 72 hours. Anemia Panel: No results for input(s): VITAMINB12, FOLATE, FERRITIN, TIBC, IRON, RETICCTPCT in the last 72 hours. Sepsis Labs: Recent Labs  Lab 06/24/20 1323 06/24/20 1657  LATICACIDVEN 0.9 0.9    Recent Results (from the past 240 hour(s))  Culture, blood (Routine X 2) w Reflex to ID Panel     Status: None   Collection Time: 06/21/20 11:07 AM   Specimen: BLOOD  Result Value Ref Range Status   Specimen Description BLOOD RIGHT [facility]  Final   Special Requests   Final    BOTTLES DRAWN AEROBIC AND ANAEROBIC Blood Culture adequate volume   Culture   Final    NO GROWTH 5 DAYS Performed at [facility], [address] [place], [place] [address]    Report Status 06/26/2020 FINAL  Final  Culture, blood (Routine X 2) w Reflex to ID  Panel     Status: None   Collection Time: 06/21/20 11:09 AM   Specimen: BLOOD  Result Value Ref Range Status   Specimen Description BLOOD [place]  Final   Special Requests   Final    BOTTLES DRAWN AEROBIC AND ANAEROBIC Blood Culture adequate volume   Culture   Final    NO GROWTH 5 DAYS Performed at [facility], [address] [place], [place] [address]    Report Status 06/26/2020 FINAL  Final         Radiology Studies: DG Abd Portable 1V  Result Date: 06/29/2020 CLINICAL DATA:  Small bowel obstruction. EXAM: PORTABLE ABDOMEN - 1 VIEW COMPARISON:  June 27, 2020. FINDINGS: Nasogastric tube tip is seen in the distal stomach. Stable small bowel dilatation is noted concerning for distal small bowel obstruction. Postsurgical changes are seen involving the lumbar spine and sacrum. Right ureteral stent is noted. IMPRESSION: Stable small bowel dilatation is noted concerning for distal small bowel obstruction. Electronically Signed   By: Marijo Conception M.D.   On: 06/29/2020 08:53        Scheduled Meds: . vitamin C  500 mg Oral Daily  . brimonidine  1 drop Both Eyes BID  . chlorhexidine  15 mL Mouth Rinse BID  . Chlorhexidine Gluconate Cloth  6 each Topical Q0600  . hydrALAZINE  10 mg Intravenous Q6H  . insulin aspart  0-9 Units Subcutaneous Q6H  . mouth rinse  15 mL Mouth Rinse BID  . metoprolol tartrate  5 mg Intravenous Q6H  . pantoprazole (PROTONIX) IV  40 mg Intravenous Q24H  . sodium chloride flush  10-40 mL Intracatheter Q12H  . sodium chloride flush  3 mL Intravenous Q12H  . tamsulosin  0.4 mg Oral Daily  . zinc sulfate  220 mg Oral Daily   Continuous Infusions: . sodium chloride Stopped (06/27/20 1630)  . [START ON 06/30/2020] cefoTEtan (CEFOTAN) IV    . TPN ADULT (ION) 30 mL/hr at 06/29/20 1530  . TPN ADULT (ION)       LOS: 12 days    Time spent: 42 minutes spent on chart review, discussion with nursing staff, consultants, updating family and  interview/physical exam; more than 50% of that time was spent in counseling and/or coordination of care.    Elai Vanwyk J [place] DO [facility] Available via Epic secure chat 7am-7pm After these hours, please refer to coverage provider listed on [email or web address] 06/29/2020, 4:47 PM

## 2020-06-29 NOTE — Consult Note (Addendum)
Stephanie [name] for Heparin Drip Indication:  atrial fibrillation (worsening renal function, stopping eliquis, also with ileus)  Allergies  Allergen Reactions  . Ibuprofen Itching  . Indomethacin   . Infliximab   . Methotrexate Derivatives   . Moexipril   . Percocet [Oxycodone-Acetaminophen] Itching  . Naprosyn [Naproxen] Rash    Patient Measurements: Height: 5\' 6"  (167.6 cm) Weight: 86.8 kg (191 lb 6.4 oz) IBW/kg (Calculated) : 59.3 Heparin Dosing Weight: 75.9kg  Vital Signs: Temp: 97.7 F (36.5 C) (09/29 0700) Temp Source: Oral (09/29 0525) BP: 140/49 (09/29 0700) Pulse Rate: 86 (09/29 0700)  Labs: Recent Labs    06/26/20 2043 06/27/20 0545 06/27/20 0545 06/28/20 0530 06/29/20 0645  HGB  --  9.2*   < > 8.8* 8.2*  HCT  --  24.6*  --  24.7* 23.2*  PLT  --  157  --  155 136*  APTT 102* 101*  --  82*  --   LABPROT  --  18.6*  --   --   --   INR  --  1.6*  --   --   --   HEPARINUNFRC  --  1.08*  --  0.49 0.57  CREATININE  --   --   --  6.33* 4.94*   < > = values in this interval not displayed.    Estimated Creatinine Clearance: 10.6 mL/min (A) (by C-G formula based on SCr of 4.94 mg/dL (H)).   Medical History: Past Medical History:  Diagnosis Date  . Arthritis   . Asthma   . Cancer ([place]) 1996   colon  . Diabetes mellitus without complication ([place])   . GERD (gastroesophageal reflux disease)   . Glaucoma   . Hyperlipidemia   . Hypertension   . Lumbar disc disease   . Small bowel obstruction ([place])   . Vitamin B 12 deficiency     Medications:  Last dose Eliquis as inpatient 9/24 0907  Assessment: 79 y.o.femalewith history of hypertension, diabetes, hyperlipidemia, rheumatoid arthritis, chronic urinary retention requiring self-catheterization presenting with fevers chills, diagnosed with sepsis likely from urinary origin, underwent right renal stent placement due to obstruction.   Patient being seen for new onset  atrial fibrillation with rapid ventricular response.  Now with possible ileus and worsening Scr, will dc Eliquis and start heparin per pharmacy consult.  9/25 0731 aPTT 111 sec, HL 2.04 9/25 1800 aPTT 67 sec, increased to 650 units/hr 9/26 0510 aPTT 57 sec, increased to 750 units/hr 9/26 2043 aPTT 102 sec, therapeutic x 1 9/27 0543 aPTT 101 sec, therapeutic x 2 9/28 0530 aPTT 82, HL 0.49, therapeutic  9/29 0645 HL 0.57, therapeutic  Goal of Therapy:  aPTT 66-102 seconds Monitor platelets by anticoagulation protocol: Yes   Plan:  9/29 0645 HL is therapeutic -Heparin drip was discontinued by MD after level drawn. No anticoagulation ordered at this time. Will follow up on plan.   Paulina Fusi, PharmD, BCPS 06/29/2020 8:44 AM

## 2020-06-29 NOTE — Progress Notes (Signed)
Progress Note  Patient Name: Stephanie [name] Date of Encounter: 06/29/2020  [facility] Cardiologist: New- [place] rounding  Subjective   Feels okay, undergoing physical therapy in bed, ng tube in place, getting TPN. No acute events overnight.  Inpatient Medications    Scheduled Meds: . vitamin C  500 mg Oral Daily  . brimonidine  1 drop Both Eyes BID  . chlorhexidine  15 mL Mouth Rinse BID  . Chlorhexidine Gluconate Cloth  6 each Topical Q0600  . hydrALAZINE  10 mg Intravenous Q6H  . insulin aspart  0-9 Units Subcutaneous Q4H  . mouth rinse  15 mL Mouth Rinse BID  . metoprolol tartrate  5 mg Intravenous Q6H  . pantoprazole (PROTONIX) IV  40 mg Intravenous Q24H  . sodium chloride flush  10-40 mL Intracatheter Q12H  . sodium chloride flush  3 mL Intravenous Q12H  . tamsulosin  0.4 mg Oral Daily  . zinc sulfate  220 mg Oral Daily   Continuous Infusions: . sodium chloride Stopped (06/27/20 1630)  . magnesium sulfate bolus IVPB 2 g (06/29/20 0912)  . potassium chloride 10 mEq (06/29/20 1002)  . sodium bicarbonate (isotonic) 150 mEq in D5W 1000 mL infusion 30 mL/hr at 06/29/20 0656  . TPN ADULT (ION) 30 mL/hr at 06/29/20 0600   PRN Meds: sodium chloride, acetaminophen, albuterol, guaiFENesin-dextromethorphan, heparin, morphine injection, ondansetron (ZOFRAN) IV, sodium chloride flush   Vital Signs    Vitals:   06/29/20 0000 06/29/20 0100 06/29/20 0525 06/29/20 0700  BP: (!) 129/50 (!) 151/51  (!) 140/49  Pulse:  70 79 86  Resp:  12 20 20   Temp:   97.6 F (36.4 C) 97.7 F (36.5 C)  TempSrc:   Oral   SpO2:  98% 95%   Weight:      Height:        Intake/Output Summary (Last 24 hours) at 06/29/2020 1004 Last data filed at 06/29/2020 0657 Gross per 24 hour  Intake 1226.66 ml  Output 1100 ml  Net 126.66 ml   Last 3 Weights 06/22/2020 06/21/2020 06/19/2020  Weight (lbs) 191 lb 6.4 oz 181 lb 3.2 oz 177 lb 4 oz  Weight (kg) 86.818 kg 82.192 kg 80.4 kg       Telemetry    Sinus rhythm 84- Personally Reviewed  ECG    New tracing obtained- Personally Reviewed  Physical Exam   GEN: No acute distress, looks tired, soft-spoken.   Neck: No JVD Cardiac:  RRR  Respiratory:  Poor inspiratory effort GI: Soft, nontender, non-distended  MS: No edema; No deformity. Neuro:  Nonfocal  Psych: Normal affect   Labs    High Sensitivity Troponin:   Recent Labs  Lab 06/17/20 0357 06/17/20 1101  TROPONINIHS 61* 71*      Chemistry Recent Labs  Lab 06/23/20 0649 06/24/20 0610 06/26/20 0510 06/26/20 0510 06/28/20 0530 06/28/20 1222 06/29/20 0645  NA  --    < > 127*  --  133*  --  135  K  --    < > 3.4*   < > 2.5* 3.0* 2.5*  CL  --    < > 90*  --  87*  --  92*  CO2  --    < > 20*  --  27  --  29  GLUCOSE  --    < > 186*  --  131*  --  197*  BUN  --    < > 98*  --  91*  --  62*  CREATININE  --    < > 6.14*  --  6.33*  --  4.94*  CALCIUM  --    < > 6.4*  --  6.5*  --  7.1*  PROT  --   --  5.0*  --   --   --  4.6*  ALBUMIN 1.7*  --  2.7*  --   --   --  2.1*  AST  --   --  18  --   --   --  14*  ALT  --   --  14  --   --   --  13  ALKPHOS  --   --  179*  --   --   --  150*  BILITOT  --   --  0.9  --   --   --  0.9  GFRNONAA  --    < > 6*  --  6*  --  8*  GFRAA  --    < > 7*  --  7*  --  9*  ANIONGAP  --    < > 17*  --  19*  --  14   < > = values in this interval not displayed.     Hematology Recent Labs  Lab 06/27/20 0545 06/28/20 0530 06/29/20 0645  WBC 5.3 6.2 7.5  RBC 3.36* 3.23* 3.01*  HGB 9.2* 8.8* 8.2*  HCT 24.6* 24.7* 23.2*  MCV 73.2* 76.5* 77.1*  MCH 27.4 27.2 27.2  MCHC 37.4* 35.6 35.3  RDW 14.8 15.4 15.2  PLT 157 155 136*    BNP No results for input(s): BNP, PROBNP in the last 168 hours.   DDimer No results for input(s): DDIMER in the last 168 hours.   Radiology    X-ray chest PA or AP  Result Date: 06/27/2020 CLINICAL DATA:  Status post PICC line placement.  COVID. EXAM: CHEST  1 VIEW COMPARISON:  CT  chest 06/17/2020.  Chest x-ray 06/17/2020. FINDINGS: NG tube noted with tip below left hemidiaphragm. Right IJ line noted with tip over right atrium. No PICC line noted. Heart size normal. Left base atelectasis/infiltrate small left pleural effusion. Mild right mid lung subsegmental atelectasis. Prominent skin fold on the left. No pneumothorax. Heart size normal. Prior thoracolumbar spine fusion. IMPRESSION: 1. NG tube noted with tip below left hemidiaphragm. Right IJ line noted with tip over the right atrium. No PICC line noted. 2. Left base atelectasis/infiltrate and small left pleural effusion. Mild right mid lung subsegmental atelectasis. Electronically Signed   By: Marcello Moores  Register   On: 06/27/2020 15:30   PERIPHERAL VASCULAR CATHETERIZATION  Result Date: 06/28/2020 See op note  DG Abd Portable 1V  Result Date: 06/29/2020 CLINICAL DATA:  Small bowel obstruction. EXAM: PORTABLE ABDOMEN - 1 VIEW COMPARISON:  June 27, 2020. FINDINGS: Nasogastric tube tip is seen in the distal stomach. Stable small bowel dilatation is noted concerning for distal small bowel obstruction. Postsurgical changes are seen involving the lumbar spine and sacrum. Right ureteral stent is noted. IMPRESSION: Stable small bowel dilatation is noted concerning for distal small bowel obstruction. Electronically Signed   By: Marijo Conception M.D.   On: 06/29/2020 08:53    Cardiac Studies   Echocardiogram 06/2020 1. Left ventricular ejection fraction, by estimation, is 50 to 55%. The  left ventricle has low normal function. The left ventricle has no regional  wall motion abnormalities. There is mild left ventricular hypertrophy.  Indeterminate diastolic filling due  to E-A fusion.  2. Right ventricular systolic function is normal. The right ventricular  size is normal.  3. The mitral valve is normal in structure. No evidence of mitral valve  regurgitation. No evidence of mitral stenosis.  4. The aortic valve is normal  in structure. Aortic valve regurgitation is  not visualized. No aortic stenosis is present.   Patient Profile     79 y.o. female with history of hypertension, diabetes, hyperlipidemia, rheumatoid arthritis, chronic urinary retention requiring self-catheterization presenting with fevers chills, diagnosed with sepsis likely from urinary origin, underwent right renal stent placement due to obstruction.  Patient being seen for new onset atrial fibrillation   Assessment & Plan    1.    Paroxysmas atrial fibrillation -Currently in sinus rhythm -CHA2DS2-VASc of 5 (htn, age, dm, gender) -IV Lopressor 5 mg every 6 hours, heparin drip. -P.o. beta-blocker when able.  Currently has NG tube.  2.  Hypertension -BP controlled -lopressor as above  3.  GI -TPN ongoing, NG tube in place -Management per surgery and primary team  4.  Renal failure, urinary obstruction status post stent -Dialysis yesterday, -HD as per renal  Greater than 50% was spent in counseling and coordination of care with patient Total encounter time 35 minutes       Signed, Kate Sable, MD  06/29/2020, 10:04 AM

## 2020-06-30 ENCOUNTER — Inpatient Hospital Stay: Payer: Medicare Other

## 2020-06-30 ENCOUNTER — Inpatient Hospital Stay: Payer: Medicare Other | Admitting: Certified Registered"

## 2020-06-30 ENCOUNTER — Encounter: Payer: Self-pay | Admitting: Surgery

## 2020-06-30 ENCOUNTER — Other Ambulatory Visit: Payer: Medicare Other

## 2020-06-30 ENCOUNTER — Encounter
Admission: EM | Disposition: A | Discharge status: Skilled Nursing Facility | Payer: Self-pay | Source: Home / Self Care | Attending: Internal Medicine

## 2020-06-30 DIAGNOSIS — R739 Hyperglycemia, unspecified: Secondary | ICD-10-CM

## 2020-06-30 DIAGNOSIS — Z8679 Personal history of other diseases of the circulatory system: Secondary | ICD-10-CM

## 2020-06-30 DIAGNOSIS — Z7189 Other specified counseling: Secondary | ICD-10-CM | POA: Diagnosis not present

## 2020-06-30 DIAGNOSIS — D6869 Other thrombophilia: Secondary | ICD-10-CM | POA: Diagnosis not present

## 2020-06-30 DIAGNOSIS — A419 Sepsis, unspecified organism: Secondary | ICD-10-CM | POA: Diagnosis not present

## 2020-06-30 DIAGNOSIS — N185 Chronic kidney disease, stage 5: Secondary | ICD-10-CM | POA: Diagnosis not present

## 2020-06-30 DIAGNOSIS — U071 COVID-19: Secondary | ICD-10-CM | POA: Diagnosis not present

## 2020-06-30 DIAGNOSIS — N10 Acute tubulo-interstitial nephritis: Secondary | ICD-10-CM | POA: Diagnosis not present

## 2020-06-30 HISTORY — PX: LAPAROTOMY: SHX154

## 2020-06-30 LAB — COMPREHENSIVE METABOLIC PANEL
ALT: 12 U/L (ref 0–44)
AST: 14 U/L — ABNORMAL LOW (ref 15–41)
Albumin: 2.2 g/dL — ABNORMAL LOW (ref 3.5–5.0)
Alkaline Phosphatase: 141 U/L — ABNORMAL HIGH (ref 38–126)
Anion gap: 14 (ref 5–15)
BUN: 62 mg/dL — ABNORMAL HIGH (ref 8–23)
CO2: 27 mmol/L (ref 22–32)
Calcium: 7.5 mg/dL — ABNORMAL LOW (ref 8.9–10.3)
Chloride: 93 mmol/L — ABNORMAL LOW (ref 98–111)
Creatinine, Ser: 4.77 mg/dL — ABNORMAL HIGH (ref 0.44–1.00)
GFR calc Af Amer: 10 mL/min — ABNORMAL LOW (ref >60)
GFR calc non Af Amer: 8 mL/min — ABNORMAL LOW (ref >60)
Glucose, Bld: 314 mg/dL — ABNORMAL HIGH (ref 70–99)
Potassium: 3.7 mmol/L (ref 3.5–5.1)
Sodium: 134 mmol/L — ABNORMAL LOW (ref 135–145)
Total Bilirubin: 0.8 mg/dL (ref 0.3–1.2)
Total Protein: 4.9 g/dL — ABNORMAL LOW (ref 6.5–8.1)

## 2020-06-30 LAB — GLUCOSE, CAPILLARY
Glucose-Capillary: 279 mg/dL — ABNORMAL HIGH (ref 70–99)
Glucose-Capillary: 290 mg/dL — ABNORMAL HIGH (ref 70–99)
Glucose-Capillary: 294 mg/dL — ABNORMAL HIGH (ref 70–99)
Glucose-Capillary: 319 mg/dL — ABNORMAL HIGH (ref 70–99)
Glucose-Capillary: 319 mg/dL — ABNORMAL HIGH (ref 70–99)
Glucose-Capillary: 329 mg/dL — ABNORMAL HIGH (ref 70–99)
Glucose-Capillary: 341 mg/dL — ABNORMAL HIGH (ref 70–99)
Glucose-Capillary: 341 mg/dL — ABNORMAL HIGH (ref 70–99)

## 2020-06-30 LAB — PHOSPHORUS: Phosphorus: 5.1 mg/dL — ABNORMAL HIGH (ref 2.5–4.6)

## 2020-06-30 LAB — MAGNESIUM: Magnesium: 2.1 mg/dL (ref 1.7–2.4)

## 2020-06-30 IMAGING — DX DG ABD PORTABLE 2V
3 series · 3 of 3 positions shown · non-contrast
Comparison: [DATE]

CLINICAL DATA: History of small-bowel obstruction

EXAM:
X-RAY ABDOMEN 2 VIEWS

[abdomen erect]
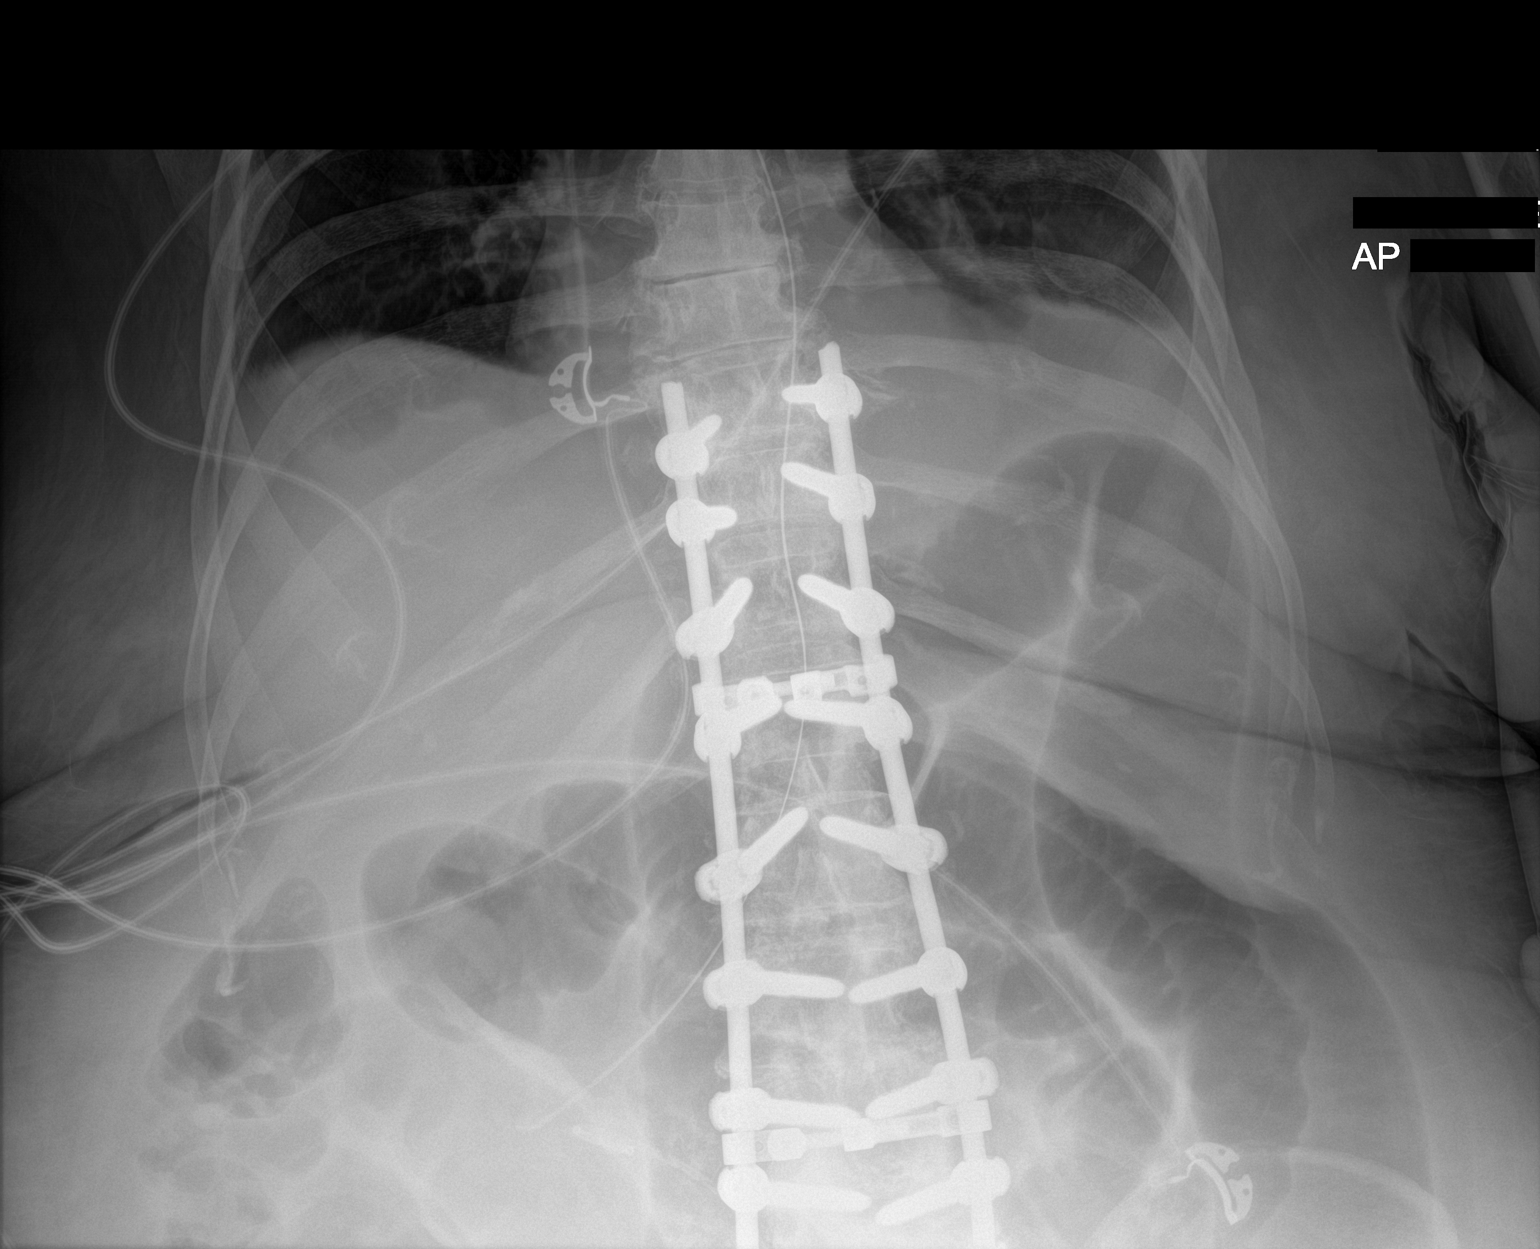

[abdomen supine (1 of 2)]
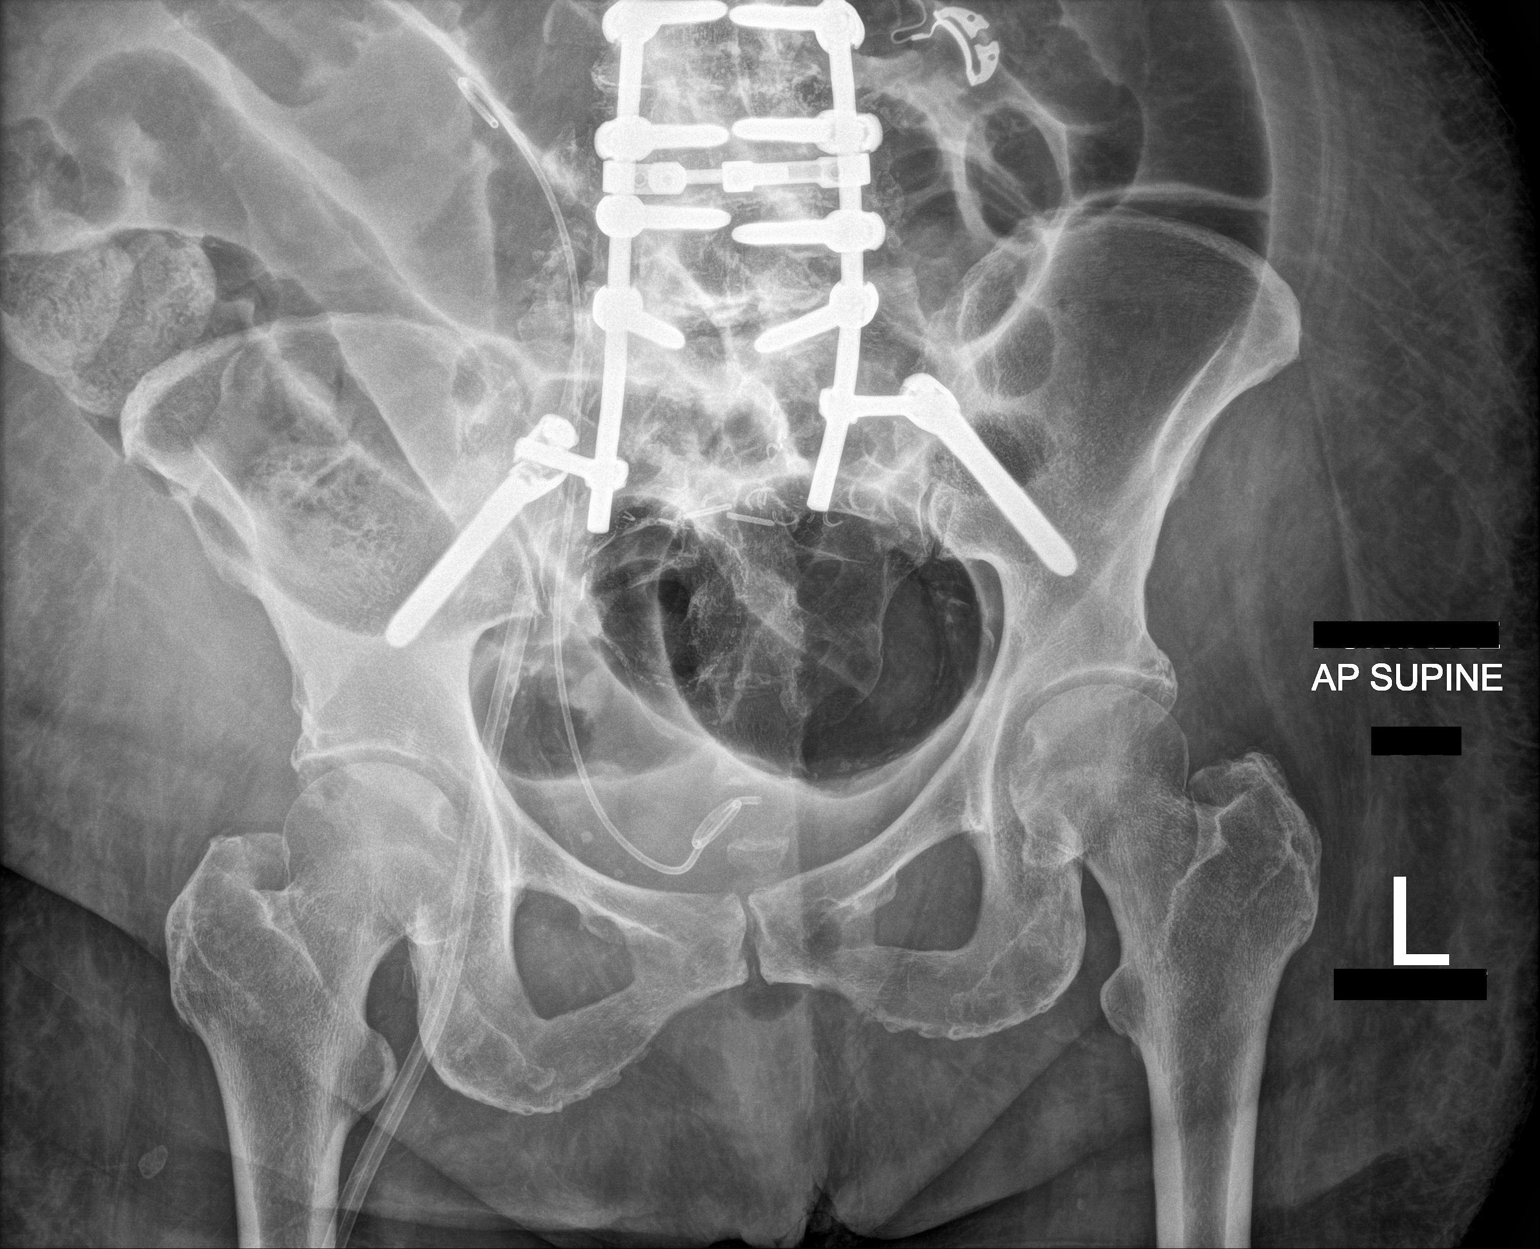

[abdomen supine (2 of 2)]
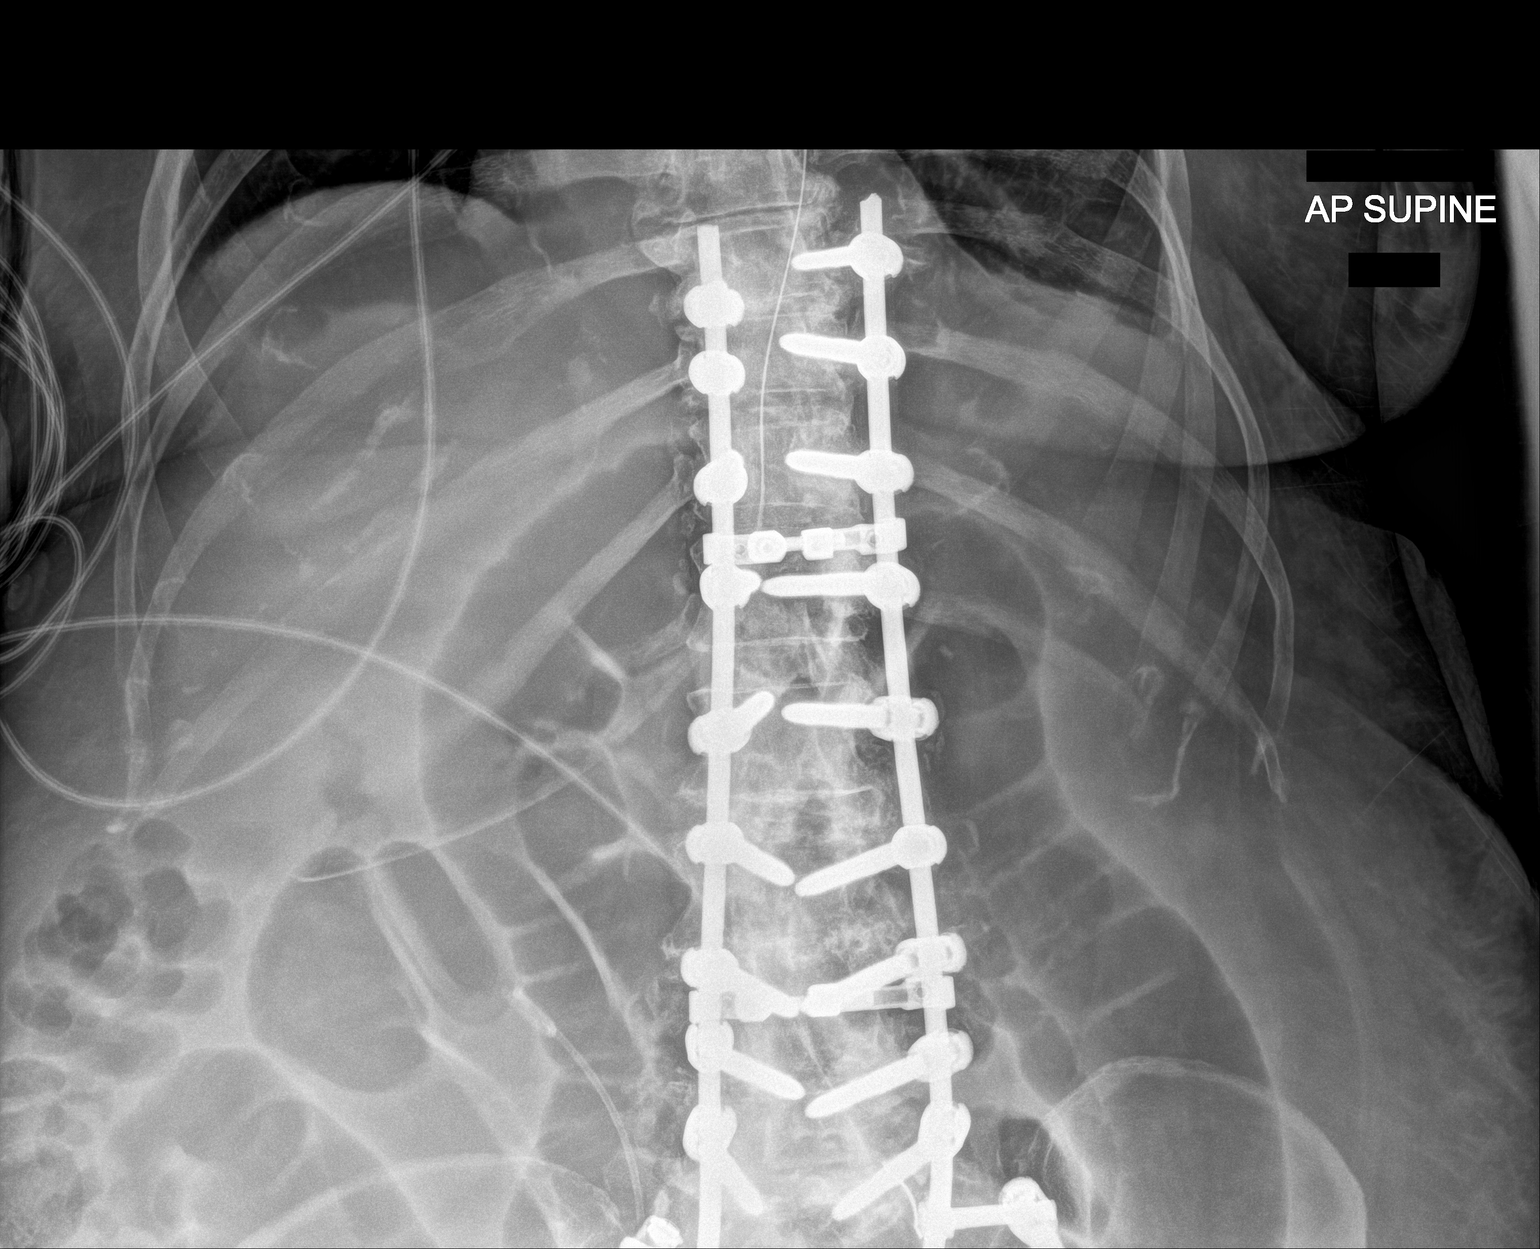

[3 of 3 positions shown; findings below may reference images not displayed]

FINDINGS: Scattered large and small bowel gas is noted. Persistent small bowel
dilatation is seen and stable consistent with at least partial small
bowel obstruction. Right ureteral stent in nasogastric catheter are
noted and stable. Postsurgical changes in the thoracolumbar spine
are noted. Right femoral dialysis catheter is noted.
IMPRESSION: Persistent small bowel obstruction.  No free air is seen.

## 2020-06-30 SURGERY — LAPAROTOMY, EXPLORATORY
Anesthesia: General

## 2020-06-30 MED ORDER — HYDROMORPHONE HCL 1 MG/ML IJ SOLN
INTRAMUSCULAR | Status: DC | PRN
  Administered 2020-06-30 (×2): .5 mg via INTRAVENOUS

## 2020-06-30 MED ORDER — HYDROMORPHONE HCL 1 MG/ML IJ SOLN
INTRAMUSCULAR | Status: AC
  Filled 2020-06-30: qty 1

## 2020-06-30 MED ORDER — INSULIN ASPART 100 UNIT/ML ~~LOC~~ SOLN
7.0000 [IU] | Freq: Once | SUBCUTANEOUS | Status: AC
  Administered 2020-06-30: 7 [IU] via SUBCUTANEOUS

## 2020-06-30 MED ORDER — ONDANSETRON HCL 4 MG/2ML IJ SOLN
INTRAMUSCULAR | Status: AC
  Filled 2020-06-30: qty 2

## 2020-06-30 MED ORDER — INSULIN ASPART 100 UNIT/ML ~~LOC~~ SOLN: 5.0000 [IU] | Freq: Once | SUBCUTANEOUS | Status: AC

## 2020-06-30 MED ORDER — ROCURONIUM BROMIDE 100 MG/10ML IV SOLN
INTRAVENOUS | Status: DC | PRN
  Administered 2020-06-30: 20 mg via INTRAVENOUS

## 2020-06-30 MED ORDER — ONDANSETRON HCL 4 MG/2ML IJ SOLN
INTRAMUSCULAR | Status: DC | PRN
  Administered 2020-06-30: 4 mg via INTRAVENOUS

## 2020-06-30 MED ORDER — VASOPRESSIN 20 UNIT/ML IV SOLN
INTRAVENOUS | Status: AC
  Filled 2020-06-30: qty 1

## 2020-06-30 MED ORDER — SODIUM CHLORIDE (PF) 0.9 % IJ SOLN
INTRAMUSCULAR | Status: AC
  Filled 2020-06-30: qty 50

## 2020-06-30 MED ORDER — FENTANYL CITRATE (PF) 100 MCG/2ML IJ SOLN
INTRAMUSCULAR | Status: AC
  Filled 2020-06-30: qty 2

## 2020-06-30 MED ORDER — INSULIN GLARGINE 100 UNIT/ML ~~LOC~~ SOLN
5.0000 [IU] | Freq: Once | SUBCUTANEOUS | Status: AC
  Administered 2020-06-30: 5 [IU] via SUBCUTANEOUS
  Filled 2020-06-30: qty 0.05

## 2020-06-30 MED ORDER — ROCURONIUM BROMIDE 10 MG/ML (PF) SYRINGE
PREFILLED_SYRINGE | INTRAVENOUS | Status: AC
  Filled 2020-06-30: qty 10

## 2020-06-30 MED ORDER — INSULIN ASPART 100 UNIT/ML ~~LOC~~ SOLN
SUBCUTANEOUS | Status: AC
  Filled 2020-06-30: qty 1

## 2020-06-30 MED ORDER — ONDANSETRON HCL 4 MG/2ML IJ SOLN: 4.0000 mg | Freq: Once | INTRAMUSCULAR | Status: DC | PRN

## 2020-06-30 MED ORDER — LIDOCAINE HCL (CARDIAC) PF 100 MG/5ML IV SOSY
PREFILLED_SYRINGE | INTRAVENOUS | Status: DC | PRN
  Administered 2020-06-30: 80 mg via INTRAVENOUS

## 2020-06-30 MED ORDER — SUGAMMADEX SODIUM 200 MG/2ML IV SOLN
INTRAVENOUS | Status: DC | PRN
  Administered 2020-06-30: 175 mg via INTRAVENOUS

## 2020-06-30 MED ORDER — TRACE MINERALS CU-MN-SE-ZN 300-55-60-3000 MCG/ML IV SOLN
INTRAVENOUS | Status: AC
  Filled 2020-06-30: qty 665.6

## 2020-06-30 MED ORDER — PHENYLEPHRINE HCL (PRESSORS) 10 MG/ML IV SOLN
INTRAVENOUS | Status: AC
  Filled 2020-06-30: qty 1

## 2020-06-30 MED ORDER — FENTANYL CITRATE (PF) 100 MCG/2ML IJ SOLN
INTRAMUSCULAR | Status: DC | PRN
Start: 2020-06-30 — End: 2020-06-30
  Administered 2020-06-30 (×2): 25 ug via INTRAVENOUS
  Administered 2020-06-30: 50 ug via INTRAVENOUS

## 2020-06-30 MED ORDER — INSULIN ASPART 100 UNIT/ML ~~LOC~~ SOLN
SUBCUTANEOUS | Status: AC
  Administered 2020-06-30: 5 [IU] via SUBCUTANEOUS
  Filled 2020-06-30: qty 1

## 2020-06-30 MED ORDER — PHENYLEPHRINE HCL (PRESSORS) 10 MG/ML IV SOLN
INTRAVENOUS | Status: DC | PRN
  Administered 2020-06-30 (×3): 100 ug via INTRAVENOUS
  Administered 2020-06-30: 200 ug via INTRAVENOUS

## 2020-06-30 MED ORDER — SUCCINYLCHOLINE CHLORIDE 20 MG/ML IJ SOLN
INTRAMUSCULAR | Status: DC | PRN
  Administered 2020-06-30: 100 mg via INTRAVENOUS

## 2020-06-30 MED ORDER — PROPOFOL 10 MG/ML IV BOLUS
INTRAVENOUS | Status: AC
  Filled 2020-06-30: qty 20

## 2020-06-30 MED ORDER — SODIUM CHLORIDE 0.9 % IV SOLN
INTRAVENOUS | Status: DC | PRN
  Administered 2020-06-30: 100 mL

## 2020-06-30 MED ORDER — BUPIVACAINE LIPOSOME 1.3 % IJ SUSP
INTRAMUSCULAR | Status: AC
  Filled 2020-06-30: qty 20

## 2020-06-30 MED ORDER — VASOPRESSIN 20 UNIT/ML IV SOLN
INTRAVENOUS | Status: DC | PRN
  Administered 2020-06-30: 1 [IU] via INTRAVENOUS

## 2020-06-30 MED ORDER — BUPIVACAINE-EPINEPHRINE (PF) 0.25% -1:200000 IJ SOLN
INTRAMUSCULAR | Status: AC
  Filled 2020-06-30: qty 30

## 2020-06-30 MED ORDER — MORPHINE SULFATE (PF) 2 MG/ML IV SOLN
1.0000 mg | INTRAVENOUS | Status: DC | PRN
  Administered 2020-07-04: 1 mg via INTRAVENOUS
  Filled 2020-06-30: qty 1

## 2020-06-30 MED ORDER — FENTANYL CITRATE (PF) 100 MCG/2ML IJ SOLN
25.0000 ug | INTRAMUSCULAR | Status: DC | PRN
  Administered 2020-06-30 (×3): 25 ug via INTRAVENOUS

## 2020-06-30 MED ORDER — ACETAMINOPHEN 10 MG/ML IV SOLN
1000.0000 mg | Freq: Four times a day (QID) | INTRAVENOUS | Status: AC
  Administered 2020-06-30 – 2020-07-01 (×4): 1000 mg via INTRAVENOUS
  Filled 2020-06-30 (×4): qty 100

## 2020-06-30 MED ORDER — PROPOFOL 10 MG/ML IV BOLUS
INTRAVENOUS | Status: DC | PRN
  Administered 2020-06-30: 100 mg via INTRAVENOUS

## 2020-06-30 SURGICAL SUPPLY — 44 items
APPLIER CLIP 11 MED OPEN (CLIP)
APPLIER CLIP 13 LRG OPEN (CLIP)
BARRIER ADH SEPRAFILM 3INX5IN (MISCELLANEOUS) ×4 IMPLANT
BLADE CLIPPER SURG (BLADE) ×2 IMPLANT
BLADE SURG SZ10 CARB STEEL (BLADE) ×2 IMPLANT
CANISTER SUCT 3000ML PPV (MISCELLANEOUS) ×2 IMPLANT
CHLORAPREP W/TINT 26 (MISCELLANEOUS) ×2 IMPLANT
CLIP APPLIE 11 MED OPEN (CLIP) IMPLANT
CLIP APPLIE 13 LRG OPEN (CLIP) IMPLANT
COVER BACK TABLE REUSABLE LG (DRAPES) ×2 IMPLANT
COVER WAND RF STERILE (DRAPES) ×2 IMPLANT
DRAPE LAPAROTOMY 100X77 ABD (DRAPES) ×2 IMPLANT
DRSG OPSITE POSTOP 4X10 (GAUZE/BANDAGES/DRESSINGS) IMPLANT
DRSG OPSITE POSTOP 4X12 (GAUZE/BANDAGES/DRESSINGS) IMPLANT
ELECT BLADE 6.5 EXT (BLADE) ×2 IMPLANT
ELECT REM PT RETURN 9FT ADLT (ELECTROSURGICAL) ×2
ELECTRODE REM PT RTRN 9FT ADLT (ELECTROSURGICAL) ×1 IMPLANT
GLOVE BIO SURGEON STRL SZ7 (GLOVE) ×2 IMPLANT
GOWN STRL REUS W/ TWL LRG LVL3 (GOWN DISPOSABLE) ×2 IMPLANT
GOWN STRL REUS W/TWL LRG LVL3 (GOWN DISPOSABLE) ×2
HANDLE SUCTION POOLE (INSTRUMENTS) ×1 IMPLANT
HANDLE YANKAUER SUCT BULB TIP (MISCELLANEOUS) ×2 IMPLANT
LIGASURE IMPACT 36 18CM CVD LR (INSTRUMENTS) IMPLANT
NEEDLE HYPO 22GX1.5 SAFETY (NEEDLE) ×4 IMPLANT
PACK BASIN MAJOR ARMC (MISCELLANEOUS) ×2 IMPLANT
RELOAD PROXIMATE 75MM BLUE (ENDOMECHANICALS) IMPLANT
SPONGE LAP 18X18 RF (DISPOSABLE) ×2 IMPLANT
SPONGE LAP 18X36 RFD (DISPOSABLE) IMPLANT
STAPLER PROXIMATE 75MM BLUE (STAPLE) IMPLANT
STAPLER SKIN PROX 35W (STAPLE) ×2 IMPLANT
SUCTION POOLE HANDLE (INSTRUMENTS) ×2
SUT PDS AB 0 CT1 27 (SUTURE) ×6 IMPLANT
SUT SILK 2 0 (SUTURE) ×1
SUT SILK 2 0 SH CR/8 (SUTURE) ×2 IMPLANT
SUT SILK 2 0SH CR/8 30 (SUTURE) ×2 IMPLANT
SUT SILK 2-0 18XBRD TIE 12 (SUTURE) ×1 IMPLANT
SUT VIC AB 0 CT1 36 (SUTURE) ×4 IMPLANT
SUT VIC AB 2-0 SH 27 (SUTURE) ×2
SUT VIC AB 2-0 SH 27XBRD (SUTURE) ×2 IMPLANT
SUT VIC AB 3-0 SH 27 (SUTURE) ×1
SUT VIC AB 3-0 SH 27X BRD (SUTURE) ×1 IMPLANT
SYR 30ML LL (SYRINGE) ×4 IMPLANT
SYR 3ML LL SCALE MARK (SYRINGE) ×2 IMPLANT
TRAY FOLEY MTR SLVR 16FR STAT (SET/KITS/TRAYS/PACK) ×2 IMPLANT

## 2020-06-30 NOTE — Progress Notes (Signed)
Called 2C and gave report to [employer].

## 2020-06-30 NOTE — Progress Notes (Signed)
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  In the ED, sodium 133, potassium 3.9, chloride 102, bicarb 18, glucose 344, BUN 38, creatinine 1.57, AST 27, ALT 21, troponin 61, lactic acid 3.1, procalcitonin 17, to BC count 20.4, hemoglobin 11.8, platelet 144.  CT angiogram chest negative for PE but with right hydronephrosis which has progressed since October 2020.  Chest x-ray with no acute cardiopulmonary disease process.  TRH consulted for admission secondary to sepsis from UTI/pyelonephritis with subsequent hydronephrosis.   Assessment & Plan:   Principal Problem:   Sepsis ([place]) Active Problems:   Benign essential hypertension   Chronic cystitis   Rheumatoid arthritis involving multiple sites with positive rheumatoid factor (HCC)   Diabetes mellitus (HCC)   Rheumatoid arthritis ([place])   COVID-19 virus infection   AKI (acute kidney injury) ([place])   Atrial fibrillation with rapid ventricular response (HCC)   Essential hypertension   Pure hypercholesterolemia   Acute pyelonephritis   Hydronephrosis, right   Lactic acid acidosis   Hyperglycemia   Hx of essential hypertension   Immunosuppression due to drug therapy   Pressure injury of skin   Acquired thrombophilia (HCC)   On continuous oral anticoagulation   SBO (small bowel obstruction) (HCC)   Goals of care, counseling/discussion   Palliative care by specialist   CKD (chronic kidney disease) stage 5, GFR less than 15 ml/min (HCC)   Ileus (HCC)   Paroxysmal  A-fib (HCC)   Sepsis, present on admission Klebsiella pneumonia septicemia, POA Right pyelonephritis Patient presenting with fever, T-max 103.0 with associated tachycardia, tachypnea with a WBC count of 20 K with a left shift and lactic acid of 3.1.  Underwent right ureter stent placement by urology with frank pus noted to kidney.  And blood cultures x2 positive for Klebsiella pneumonia. WBC count improved from 20K to 7.5.  Completed 10-day course of IV antibiotics with followed by ceftriaxone followed by ciprofloxacin.  Blood cultures 06/21/2020 negative. --Continue monitor fever curve, supportive care  Severe right hydronephrosis CT renal notable for progressive right UPJ obstruction in comparison to prior study dated October 2020.  Urology was consulted and right stent placed with findings of frank pus in the kidney by Dr. Jeffie Pollock on 06/18/2020.  Follow-up renal ultrasound 06/20/2020 with resolved hydronephrosis. --Continue Foley catheter per urology, Dr. Jeffie Pollock --Continue strict I's and O's  Acute renal failure Patient presenting above with severe right hydronephrosis with associated sepsis from Klebsiella pneumonia septicemia/pyelonephritis.  Baseline creatinine 0.90 January 2021. Creatinine 1.57 on admission, continue to trend up to a high of 6.33 during admission.  Etiology likely secondary to ATN from severe infection as above. --Nephrology following, appreciate assistance --Underwent placement of temporary HD catheter to right common femoral vein by vascular surgery on 06/27/2020.  --Started on dialysis 9/28; further per nephrology, will assess need daily --Avoid nephrotoxins, renally dose all medications --Follow BMP daily  Hypokalemia Hypomagnesemia Potassium 3.7, magnesium 2.1 this morning.  --Follow electrolytes daily --On TPN  Small bowel obstruction CT abdomen/pelvis with oral contrast notable for high-grade small bowel obstruction with abrupt  transition point central lower  abdomen, likely secondary to adhesions. --Surgery following, appreciate assistance --Continues with NG tube to low intermittent wall suction --Continues on TPN; RIJ PICC placed 9/27 --repeat KUB this am with stable small bowel dilation concerning for distal small bowel obstruction. --General surgery planning planning operative management this afternoon  Paroxysmal atrial fibrillation with RVR Currently in NSR on telemetry --Cardiology following, appreciate assistance --CHA2DS2-VASc score = 5 (HTN, age, T2DM, gender) --IV metoprolol 5 mg every 6 hours --Discontinued heparin drip 2/2 bleeding surrounding HD catheter and planned surgical invention on 06/30/2020 --continue to monitor on telemetry  GERD: Protonix 40 mg IV every 24 hours  Pressure injury of buttock, stage I, POA Pressure Injury 06/18/20 Buttocks Left;Lower Stage 1 -  Intact skin with non-blanchable redness of a localized area usually over a bony prominence. (Active)  06/18/20 0600  Location: Buttocks  Location Orientation: Left;Lower  Staging: Stage 1 -  Intact skin with non-blanchable redness of a localized area usually over a bony prominence.  Wound Description (Comments):   Present on Admission: Yes  --Continue local wound care, offloading  Hx COVID-19 viral infection Patient fully vaccinated, received Pfizer vaccine on 10/16/2019 and 11/07/2019.  Covid-19 PCR positive on 06/09/2020.  No overt respiratory symptoms.  CT chest without acute pulmonary abnormality.  Oxygenating well on room air.  Completed 5-day course of IV remdesivir. --Discontinue airborne/contact isolation since greater than 21 days since initial diagnosis   DVT prophylaxis: SCDs Code Status: DNR Family Communication: Updated patient extensively at bedside  Disposition Plan:  Status is: Inpatient  Remains inpatient appropriate because:Persistent severe electrolyte disturbances, Ongoing active pain requiring inpatient pain management, Ongoing diagnostic  testing needed not appropriate for outpatient work up, Unsafe d/c plan, IV treatments appropriate due to intensity of illness or inability to take PO and Inpatient level of care appropriate due to severity of illness   Dispo: The patient is from: Home              Anticipated d/c is to: SNF              Anticipated d/c date is: > 3 days              Patient currently is not medically stable to d/c.   Consultants:   Cardiology  General surgery  Nephrology  Urology  Palliative care  Procedures:   Right ureteral stent placement, Dr. Jeffie Pollock 9/19  Right femoral HD catheter placement, Dr. Delana Meyer 9/27  Started on HD 9/28  RIJ PICC 9/27  Antimicrobials:   Zosyn 9/19 - 9/23  Ceftriaxone 9/17 - 9/18  Ciprofloxacin 9/23 - 9/26  Azithromycin 9/17 - 9/17  Vancomycin 9/17 - 9/17  Cefepime 9/17 - 9/17    Subjective: Patient seen and examined bedside, resting comfortably.  Continues without flatus and with abdominal distention. NG tube in place to intermittent low wall suction.  Overall feels weak, fatigued and deconditioned.  No other complaints or concerns at this time.  Surgery plans operative management this afternoon.  Can now come off of airborne/contact isolation given the greater than 21 days since initial Cova-19 diagnosis.  Denies headache, no visual changes, no chest pain, no palpitations, no shortness of breath, no fever/chills/night sweats.  No acute events overnight per nursing staff.  Objective: Vitals:   06/30/20 0759 06/30/20 1057 06/30/20 1129 06/30/20 1309  BP: (!) 180/69 (!) 177/55 (!) 178/45 (!) 153/82  Pulse: 96 92 86 89  Resp: (!) 21 20 14 16   Temp: 97.7  F (36.5 C) 97.7 F (36.5 C) 98 F (36.7 C) (!) 97.4 F (36.3 C)  TempSrc:   Oral Tympanic  SpO2: 95% 95% 93% 95%  Weight:      Height:        Intake/Output Summary (Last 24 hours) at 06/30/2020 1450 Last data filed at 06/30/2020 0758 Gross per 24 hour  Intake 2258.73 ml  Output --  Net  2258.73 ml   Filed Weights   06/19/20 0625 06/21/20 0125 06/22/20 0453  Weight: 80.4 kg 82.2 kg 86.8 kg    Examination:  General exam: Appears calm and comfortable, chronically ill in appearance Respiratory system: Clear to auscultation. Respiratory effort normal.  Oxygen well on room air with SPO2 95% Cardiovascular system: S1 & S2 heard, RRR. No JVD, murmurs, rubs, gallops or clicks. No pedal edema. Gastrointestinal system: Abdomen is soft, distended, mild generalized tenderness to palpation. No organomegaly or masses felt.  No bowel sounds appreciated Central nervous system: Alert and oriented. No focal neurological deficits. Extremities: Symmetric 5 x 5 power. Skin: pressure injury stage 1 buttock Psychiatry: Judgement and insight appear normal. Mood & affect appropriate.     Data Reviewed: I have personally reviewed following labs and imaging studies  CBC: Recent Labs  Lab 06/25/20 0731 06/26/20 0510 06/27/20 0545 06/28/20 0530 06/29/20 0645  WBC 11.3* 6.7 5.3 6.2 7.5  NEUTROABS  --   --   --   --  6.4  HGB 9.4* 8.9* 9.2* 8.8* 8.2*  HCT 25.4* 24.0* 24.6* 24.7* 23.2*  MCV 74.3* 72.9* 73.2* 76.5* 77.1*  PLT 184 184 157 155 601*   Basic Metabolic Panel: Recent Labs  Lab 06/25/20 0731 06/25/20 0731 06/26/20 0510 06/26/20 0510 06/28/20 0530 06/28/20 0530 06/28/20 1222 06/29/20 0645 06/29/20 1253 06/29/20 1658 06/30/20 0600  NA 124*   < > 127*  --  133*  --   --  135 135  --  134*  K 4.0   < > 3.4*   < > 2.5*   < > 3.0* 2.5* 3.1* 3.2* 3.7  CL 95*   < > 90*  --  87*  --   --  92* 93*  --  93*  CO2 14*   < > 20*  --  27  --   --  29 27  --  27  GLUCOSE 179*   < > 186*  --  131*  --   --  197* 223*  --  314*  BUN 94*   < > 98*  --  91*  --   --  62* 60*  --  62*  CREATININE 5.96*   < > 6.14*  --  6.33*  --   --  4.94* 4.80*  --  4.77*  CALCIUM 6.5*   < > 6.4*  --  6.5*  --   --  7.1* 7.3*  --  7.5*  MG 1.9  --   --   --  1.8  --   --  1.8  --  2.1 2.1  PHOS  --    --   --   --  9.3*  --   --  6.5*  --   --  5.1*   < > = values in this interval not displayed.   GFR: Estimated Creatinine Clearance: 11 mL/min (A) (by C-G formula based on SCr of 4.77 mg/dL (H)). Liver Function Tests: Recent Labs  Lab 06/26/20 0510 06/29/20 0645 06/30/20 0600  AST 18 14* 14*  ALT 14 13 12   ALKPHOS 179* 150* 141*  BILITOT 0.9 0.9 0.8  PROT 5.0* 4.6* 4.9*  ALBUMIN 2.7* 2.1* 2.2*   No results for input(s): LIPASE, AMYLASE in the last 168 hours. No results for input(s): AMMONIA in the last 168 hours. Coagulation Profile: Recent Labs  Lab 06/24/20 1323 06/25/20 0731 06/26/20 0510 06/27/20 0545  INR 2.0* 2.1* 1.8* 1.6*   Cardiac Enzymes: No results for input(s): CKTOTAL, CKMB, CKMBINDEX, TROPONINI in the last 168 hours. BNP (last 3 results) No results for input(s): PROBNP in the last 8760 hours. HbA1C: No results for input(s): HGBA1C in the last 72 hours. CBG: Recent Labs  Lab 06/30/20 0021 06/30/20 0355 06/30/20 0757 06/30/20 1130 06/30/20 1316  GLUCAP 290* 294* 279* 341* 329*   Lipid Profile: Recent Labs    06/29/20 0654  TRIG 93   Thyroid Function Tests: No results for input(s): TSH, T4TOTAL, FREET4, T3FREE, THYROIDAB in the last 72 hours. Anemia Panel: No results for input(s): VITAMINB12, FOLATE, FERRITIN, TIBC, IRON, RETICCTPCT in the last 72 hours. Sepsis Labs: Recent Labs  Lab 06/24/20 1323 06/24/20 1657  LATICACIDVEN 0.9 0.9    Recent Results (from the past 240 hour(s))  Culture, blood (Routine X 2) w Reflex to ID Panel     Status: None   Collection Time: 06/21/20 11:07 AM   Specimen: BLOOD  Result Value Ref Range Status   Specimen Description BLOOD RIGHT [facility]  Final   Special Requests   Final    BOTTLES DRAWN AEROBIC AND ANAEROBIC Blood Culture adequate volume   Culture   Final    NO GROWTH 5 DAYS Performed at [facility], [address] [place], [place] [address]    Report Status 06/26/2020 FINAL  Final    Culture, blood (Routine X 2) w Reflex to ID Panel     Status: None   Collection Time: 06/21/20 11:09 AM   Specimen: BLOOD  Result Value Ref Range Status   Specimen Description BLOOD [place]  Final   Special Requests   Final    BOTTLES DRAWN AEROBIC AND ANAEROBIC Blood Culture adequate volume   Culture   Final    NO GROWTH 5 DAYS Performed at [facility], [address] [place], [place] [address]    Report Status 06/26/2020 FINAL  Final         Radiology Studies: DG Abd Portable 1V  Result Date: 06/29/2020 CLINICAL DATA:  Small bowel obstruction. EXAM: PORTABLE ABDOMEN - 1 VIEW COMPARISON:  June 27, 2020. FINDINGS: Nasogastric tube tip is seen in the distal stomach. Stable small bowel dilatation is noted concerning for distal small bowel obstruction. Postsurgical changes are seen involving the lumbar spine and sacrum. Right ureteral stent is noted. IMPRESSION: Stable small bowel dilatation is noted concerning for distal small bowel obstruction. Electronically Signed   By: Marijo Conception M.D.   On: 06/29/2020 08:53   DG Abd Portable 2V  Result Date: 06/30/2020 CLINICAL DATA:  History of small-bowel obstruction EXAM: X-RAY ABDOMEN 2 VIEWS COMPARISON:  06/29/2020 FINDINGS: Scattered large and small bowel gas is noted. Persistent small bowel dilatation is seen and stable consistent with at least partial small bowel obstruction. Right ureteral stent in nasogastric catheter are noted and stable. Postsurgical changes in the thoracolumbar spine are noted. Right femoral dialysis catheter is noted. IMPRESSION: Persistent small bowel obstruction.  No free air is seen. Electronically Signed   By: Inez Catalina M.D.   On: 06/30/2020 08:09  Scheduled Meds: . [MAR Hold] vitamin C  500 mg Oral Daily  . [MAR Hold] brimonidine  1 drop Both Eyes BID  . [MAR Hold] chlorhexidine  15 mL Mouth Rinse BID  . [MAR Hold] Chlorhexidine Gluconate Cloth  6 each Topical Q0600  . [MAR  Hold] hydrALAZINE  10 mg Intravenous Q6H  . [MAR Hold] insulin aspart  0-9 Units Subcutaneous Q6H  . [MAR Hold] mouth rinse  15 mL Mouth Rinse BID  . [MAR Hold] metoprolol tartrate  5 mg Intravenous Q6H  . [MAR Hold] pantoprazole (PROTONIX) IV  40 mg Intravenous Q24H  . [MAR Hold] sodium chloride flush  10-40 mL Intracatheter Q12H  . [MAR Hold] sodium chloride flush  3 mL Intravenous Q12H  . [MAR Hold] tamsulosin  0.4 mg Oral Daily  . [MAR Hold] zinc sulfate  220 mg Oral Daily   Continuous Infusions: . [MAR Hold] sodium chloride 0 mL (06/27/20 1630)  . TPN ADULT (ION) 60 mL/hr at 06/30/20 0758  . TPN ADULT (ION)       LOS: 13 days    Time spent: 39 minutes spent on chart review, discussion with nursing staff, consultants, updating family and interview/physical exam; more than 50% of that time was spent in counseling and/or coordination of care.    Jesse Hirst J [place] DO [facility] Available via Epic secure chat 7am-7pm After these hours, please refer to coverage provider listed on [email or web address] 06/30/2020, 2:50 PM

## 2020-06-30 NOTE — Progress Notes (Signed)
OT Cancellation Note  Patient Details Name: Stephanie [name] MRN: [id number] DOB: 07-29-1941   Cancelled Treatment:    Reason Eval/Treat Not Completed: Patient at procedure or test/ unavailable  Pt off floor for exploratory laparotomy. Will f/u for OT treatment at later date/time as available/appropriate. Thank you.  Gerrianne Scale, [place], OTR/L ascom [phone] 06/30/20, 1:39 PM

## 2020-06-30 NOTE — Progress Notes (Signed)
Palliative: Stephanie [name] is lying quietly in bed in her room.  She is sleeping, but wakes easily when I enter the room.  She is alert and oriented, able to make her basic needs known.  There is no family at bedside at this time.    Stephanie [name] tells me that some of the doctors say she needs surgery, and some do not.  She tells me that she is agreeable to surgery if offered.  She tells me that she will be transferred to a room where she can visit with family as her Covid isolation has reached its end.  She has been evaluated by surgery, and will indeed have surgery.  She also tells me that she will not need dialysis.  She is unable to tell me if that is just for today or if she no longer needs dialysis at all.  Nephrology consult note states that need for HD will be assessed daily.  Somewhat limited interview due to acute illness, Covid 19 restrictions. PMT to continue to follow.  Plan: Continue to treat the treatable but no CPR or intubation.  Agreeable to surgery.  Agreeable to continue HD as offered/needed.  Anticipate need for short-term rehab.  35 minutes Stephanie Axe, NP Palliative medicine team Team phone [phone] Greater than 50% of this time was spent counseling and coordinating care related to the above assessment and plan.

## 2020-06-30 NOTE — Anesthesia Procedure Notes (Signed)
Procedure Name: Intubation Date/Time: 06/30/2020 1:48 PM Performed by: Chanetta Marshall, CRNA Pre-anesthesia Checklist: Patient identified, Emergency Drugs available, Suction available and Patient being monitored Patient Re-evaluated:Patient Re-evaluated prior to induction Oxygen Delivery Method: Circle system utilized Preoxygenation: Pre-oxygenation with 100% oxygen Induction Type: IV induction Ventilation: Mask ventilation without difficulty Laryngoscope Size: McGraph and 3 Grade View: Grade I Tube type: Oral Tube size: 7.0 mm Number of attempts: 1 Airway Equipment and Method: Stylet and Video-laryngoscopy Placement Confirmation: ETT inserted through vocal cords under direct vision,  positive ETCO2,  breath sounds checked- equal and bilateral and CO2 detector Secured at: 20 cm Tube secured with: Tape Dental Injury: Teeth and Oropharynx as per pre-operative assessment

## 2020-06-30 NOTE — Progress Notes (Signed)
PT Cancellation Note  Patient Details Name: Stephanie [name] MRN: [id number] DOB: Jun 16, 1941   Cancelled Treatment:    Reason Eval/Treat Not Completed: Other (comment). Pt currently off unit for exploratory lap this PM secondary to SBO. Not available for treatment at this time. Will re-attempt next date.   Maziah Smola 06/30/2020, 1:37 PM Greggory Stallion, PT, DPT [phone]

## 2020-06-30 NOTE — Transfer of Care (Signed)
Immediate Anesthesia Transfer of Care Note  Patient: Stephanie [name]  Procedure(s) Performed: EXPLORATORY LAPAROTOMY (N/A )  Patient Location: PACU  Anesthesia Type:General  Level of Consciousness: awake, alert  and oriented  Airway & Oxygen Therapy: Patient Spontanous Breathing and Patient connected to face mask oxygen  Post-op Assessment: Report given to RN and Post -op Vital signs reviewed and stable  Post vital signs: Reviewed and stable  Last Vitals:  Vitals Value Taken Time  BP 143/56 06/30/20 1512  Temp    Pulse 90 06/30/20 1515  Resp 14 06/30/20 1515  SpO2 97 % 06/30/20 1515  Vitals shown include unvalidated device data.  Last Pain:  Vitals:   06/30/20 1309  TempSrc: Tympanic  PainSc:       Patients Stated Pain Goal: 0 (62/44/69 5072)  Complications: No complications documented.

## 2020-06-30 NOTE — Anesthesia Preprocedure Evaluation (Signed)
Anesthesia Evaluation  Patient identified by MRN, date of birth, ID band Patient awake    Reviewed: Allergy & Precautions, H&P , NPO status , Patient's Chart, lab work & pertinent test results, reviewed documented beta blocker date and time   Airway Mallampati: II  TM Distance: >3 FB Neck ROM: full    Dental  (+) Teeth Intact   Pulmonary asthma , pneumonia, resolved,    Pulmonary exam normal        Cardiovascular Exercise Tolerance: Poor hypertension, On Medications negative cardio ROS Normal cardiovascular exam Rhythm:regular Rate:Normal     Neuro/Psych negative neurological ROS  negative psych ROS   GI/Hepatic Neg liver ROS, GERD  Medicated,  Endo/Other  negative endocrine ROSdiabetes, Well Controlled  Renal/GU Renal disease  negative genitourinary   Musculoskeletal   Abdominal   Peds  Hematology negative hematology ROS (+)   Anesthesia Other Findings Past Medical History: No date: Arthritis No date: Asthma 1996: Cancer ([place])     Comment:  colon No date: Diabetes mellitus without complication ([place]) No date: GERD (gastroesophageal reflux disease) No date: Glaucoma No date: Hyperlipidemia No date: Hypertension No date: Lumbar disc disease No date: Small bowel obstruction (HCC) No date: Vitamin B 12 deficiency Past Surgical History: No date: ABDOMINAL HYSTERECTOMY No date: BACK SURGERY No date: COLON SURGERY 05/22/2017: COLONOSCOPY WITH PROPOFOL; N/A     Comment:  Procedure: COLONOSCOPY WITH PROPOFOL;  Surgeon: Manya Silvas, MD;  Location: [facility] ENDOSCOPY;  Service:               Endoscopy;  Laterality: N/A; 06/18/2020: CYSTOSCOPY WITH STENT PLACEMENT; Right     Comment:  Procedure: [place];  Surgeon:               Irine Seal, MD;  Location: [facility] ORS;  Service: Urology;               Laterality: Right; No date: FRACTURE SURGERY No date: KNEE ARTHROSCOPY No  date: LUMBAR LAMINECTOMY 06/27/2020: TEMPORARY DIALYSIS CATHETER; N/A     Comment:  Procedure: TEMPORARY DIALYSIS CATHETER;  Surgeon:               Katha Cabal, MD;  Location: [place];               Service: Cardiovascular;  Laterality: N/A; BMI    Body Mass Index: 30.89 kg/m     Reproductive/Obstetrics negative OB ROS                             Anesthesia Physical Anesthesia Plan  ASA: III and emergent  Anesthesia Plan: General ETT   Post-op Pain Management:    Induction:   PONV Risk Score and Plan:   Airway Management Planned:   Additional Equipment:   Intra-op Plan:   Post-operative Plan:   Informed Consent: I have reviewed the patients History and Physical, chart, labs and discussed the procedure including the risks, benefits and alternatives for the proposed anesthesia with the patient or authorized representative who has indicated his/her understanding and acceptance.     Dental Advisory Given  Plan Discussed with: CRNA  Anesthesia Plan Comments:         Anesthesia Quick Evaluation

## 2020-06-30 NOTE — Op Note (Signed)
PROCEDURES: Exploratory laparotomy with lysis of adhesions  Pre-operative Diagnosis: SBO  Post-operative Diagnosis: Same  Surgeon: Marjory Lies Deandre Brannan   Assistants: Mr. Otho Ket, PA-C (required due to the complexity, acuity of this case and for appropriate exposure, retraction and due to the lack of a qualified first assistant)  Anesthesia: General endotracheal anesthesia  ASA Class: 3  Surgeon: Caroleen Hamman , MD FACS  Anesthesia: Gen. with endotracheal tube   Findings: Anterior abdominal wall pain causing a transition point and obstruction on  ileum portion. No evidence of any ischemia or masses.  Estimated Blood Loss: 10cc              Complications: None               Condition: stable  Procedure Details  The patient was seen again in the Holding Room. The benefits, complications, treatment options, and expected outcomes were discussed with the patient. The risks of bleeding, infection, recurrence of symptoms, failure to resolve symptoms,  bowel injury, any of which could require further surgery were reviewed with the patient.   The patient was taken to Operating Room, identified as Stephanie [name] and the procedure verified.  A Time Out was held and the above information confirmed.  Prior to the induction of general anesthesia, antibiotic prophylaxis was administered. VTE prophylaxis was in place. General endotracheal anesthesia was then administered and tolerated well. After the induction, the abdomen was prepped with Chloraprep and draped in the sterile fashion. The patient was positioned in the supine position.   Is midline laparotomy incision was created and we entered the abdominal cavity under direct visualization after incising the fascia on the superior portion.  We extended our incision on the fascial level and we encountered significant adhesions from the omentum to the abdominal wall and from the small bowel to the abdominal wall.  The small bowel was completely stuck and  fused to the anterior abdominal wall.  We performed lysis of adhesions with a combination of scissors and sharp blade.  Result extensive lysis of adhesions there was a small serosal tear that we were able to reinforce it with Lembert 2-0 silk sutures in the standard fashion. Were able to release the band and release the partial obstruction.  Then were able to stent her incision and proceeded to run the bowel from the ligament of Treitz to the cecum.  There was evidence of a transition point where the prior band that was located on the terminal ileum was encountered.  There is no evidence of necrosis there was no evidence of malignancies.  Rest of the abdomen did not show any acute abnormality.  4 pieces of Seprafilm were placed in the standard fashion and liposomal Marcaine was injected throughout the abdominal wall under direct visualization.  Fascia was closed with a running PDS suture in standard fashion and the skin was closed with staples. Needle and laparotomy count were correct and there were no immediate complications  Caroleen Hamman, MD, FACS

## 2020-06-30 NOTE — Progress Notes (Addendum)
[facility]  ROUNDING NOTE   Subjective:   Stephanie [name] is a 79 y.o. female who presents with small bowel obstruction associated with COVID-19.     Patient resting in bed,in no acute distress. F/C draining dark amber urine.NGT with greenish output.  09/29 0701 - 09/30 0700 In: 1635.2 [I.V.:588.8; NG/GT:150; IV Piggyback:896.4] Out: -    Objective:  Vital signs in last 24 hours:  Temp:  [96.8 F (36 C)-98 F (36.7 C)] (P) 96.8 F (36 C) (09/30 1515) Pulse Rate:  [83-96] 89 (09/30 1309) Resp:  [14-21] 16 (09/30 1309) BP: (117-180)/(45-82) 153/82 (09/30 1309) SpO2:  [93 %-95 %] 95 % (09/30 1309)  Weight change:  Filed Weights   06/19/20 0625 06/21/20 0125 06/22/20 0453  Weight: 80.4 kg 82.2 kg 86.8 kg    Intake/Output: I/O last 3 completed shifts: In: 2861.9 [I.V.:1815.5; NG/GT:150; IV Piggyback:896.4] Out: 1100 [Urine:1100]   Intake/Output this shift:  Total I/O In: 1223.5 [I.V.:1223.5] Out: 460 [Urine:450; Blood:10]  Physical Exam: General:  Chronically ill appearing  Head: Normocephalic, atraumatic.NGT in place  Lungs:  Normal and symmetrical respirations  Heart: S1S2, regular  Abdomen:  Mild distension +, tenderness +  Extremities:  No peripheral edema.  Neurologic: Nonfocal, moving all four extremities  Skin: No new rashes or lesions  Access: Rt fem temp dialysis  catheter placed on 12/13/95    Basic Metabolic Panel: Recent Labs  Lab 06/25/20 0731 06/25/20 0731 06/26/20 0510 06/26/20 0510 06/28/20 0530 06/28/20 0530 06/28/20 1222 06/29/20 0645 06/29/20 1253 06/29/20 1658 06/30/20 0600  NA 124*   < > 127*  --  133*  --   --  135 135  --  134*  K 4.0   < > 3.4*   < > 2.5*   < > 3.0* 2.5* 3.1* 3.2* 3.7  CL 95*   < > 90*  --  87*  --   --  92* 93*  --  93*  CO2 14*   < > 20*  --  27  --   --  29 27  --  27  GLUCOSE 179*   < > 186*  --  131*  --   --  197* 223*  --  314*  BUN 94*   < > 98*  --  91*  --   --  62* 60*  --  62*   CREATININE 5.96*   < > 6.14*  --  6.33*  --   --  4.94* 4.80*  --  4.77*  CALCIUM 6.5*   < > 6.4*   < > 6.5*   < >  --  7.1* 7.3*  --  7.5*  MG 1.9  --   --   --  1.8  --   --  1.8  --  2.1 2.1  PHOS  --   --   --   --  9.3*  --   --  6.5*  --   --  5.1*   < > = values in this interval not displayed.    Liver Function Tests: Recent Labs  Lab 06/26/20 0510 06/29/20 0645 06/30/20 0600  AST 18 14* 14*  ALT 14 13 12   ALKPHOS 179* 150* 141*  BILITOT 0.9 0.9 0.8  PROT 5.0* 4.6* 4.9*  ALBUMIN 2.7* 2.1* 2.2*   No results for input(s): LIPASE, AMYLASE in the last 168 hours. No results for input(s): AMMONIA in the last 168 hours.  CBC: Recent Labs  Lab  06/25/20 0731 06/26/20 0510 06/27/20 0545 06/28/20 0530 06/29/20 0645  WBC 11.3* 6.7 5.3 6.2 7.5  NEUTROABS  --   --   --   --  6.4  HGB 9.4* 8.9* 9.2* 8.8* 8.2*  HCT 25.4* 24.0* 24.6* 24.7* 23.2*  MCV 74.3* 72.9* 73.2* 76.5* 77.1*  PLT 184 184 157 155 136*    Cardiac Enzymes: No results for input(s): CKTOTAL, CKMB, CKMBINDEX, TROPONINI in the last 168 hours.  BNP: Invalid input(s): POCBNP  CBG: Recent Labs  Lab 06/30/20 0355 06/30/20 0757 06/30/20 1130 06/30/20 1316 06/30/20 1513  GLUCAP 294* 279* 341* 329* 319*    Microbiology: Results for orders placed or performed during the hospital encounter of 06/17/20  Blood culture (routine x 2)     Status: Abnormal   Collection Time: 06/17/20  3:57 AM   Specimen: BLOOD  Result Value Ref Range Status   Specimen Description   Final    BLOOD LEFT HAND Performed at [facility], [address] [place], [place] [address]    Special Requests   Final    BOTTLES DRAWN AEROBIC AND ANAEROBIC Blood Culture adequate volume Performed at [facility], [address] [place], [place] [address]    Culture  Setup Time   Final    Organism ID to follow GRAM NEGATIVE RODS IN BOTH AEROBIC AND ANAEROBIC BOTTLES CRITICAL RESULT CALLED TO, READ BACK BY AND  VERIFIED WITH: AMY THOMPSON AT 1534 ON 06/17/20 [place] Performed at [facility], [address] [place], [place] [address]    Culture KLEBSIELLA PNEUMONIAE (A)  Final   Report Status 06/19/2020 FINAL  Final   Organism ID, Bacteria KLEBSIELLA PNEUMONIAE  Final      Susceptibility   Klebsiella pneumoniae - MIC*    AMPICILLIN RESISTANT Resistant     CEFAZOLIN <=4 SENSITIVE Sensitive     CEFEPIME <=0.12 SENSITIVE Sensitive     CEFTAZIDIME <=1 SENSITIVE Sensitive     CEFTRIAXONE <=0.25 SENSITIVE Sensitive     CIPROFLOXACIN <=0.25 SENSITIVE Sensitive     GENTAMICIN <=1 SENSITIVE Sensitive     IMIPENEM <=0.25 SENSITIVE Sensitive     TRIMETH/SULFA <=20 SENSITIVE Sensitive     AMPICILLIN/SULBACTAM 4 SENSITIVE Sensitive     PIP/TAZO <=4 SENSITIVE Sensitive     * KLEBSIELLA PNEUMONIAE  Blood culture (routine x 2)     Status: Abnormal   Collection Time: 06/17/20  3:57 AM   Specimen: BLOOD  Result Value Ref Range Status   Specimen Description   Final    BLOOD RIGHT [facility] Performed at [facility], [address], [place], [place] [address]    Special Requests   Final    BOTTLES DRAWN AEROBIC AND ANAEROBIC Blood Culture adequate volume Performed at [facility], [address] [place], [place] [address]    Culture  Setup Time   Final    GRAM NEGATIVE RODS IN BOTH AEROBIC AND ANAEROBIC BOTTLES CRITICAL VALUE NOTED.  VALUE IS CONSISTENT WITH PREVIOUSLY REPORTED AND CALLED VALUE. Performed at [facility], [address] [place], [place] [address]    Culture (A)  Final    KLEBSIELLA PNEUMONIAE SUSCEPTIBILITIES PERFORMED ON PREVIOUS CULTURE WITHIN THE LAST 5 DAYS. Performed at [facility], [place] [address] [place], [place] [address]    Report Status 06/19/2020 FINAL  Final  Blood Culture ID Panel (Reflexed)     Status: Abnormal   Collection Time: 06/17/20  3:57 AM  Result Value Ref Range Status  Enterococcus faecalis NOT DETECTED NOT DETECTED  Final   Enterococcus Faecium NOT DETECTED NOT DETECTED Final   Listeria monocytogenes NOT DETECTED NOT DETECTED Final   Staphylococcus species NOT DETECTED NOT DETECTED Final   Staphylococcus aureus (BCID) NOT DETECTED NOT DETECTED Final   Staphylococcus epidermidis NOT DETECTED NOT DETECTED Final   Staphylococcus lugdunensis NOT DETECTED NOT DETECTED Final   Streptococcus species NOT DETECTED NOT DETECTED Final   Streptococcus agalactiae NOT DETECTED NOT DETECTED Final   Streptococcus pneumoniae NOT DETECTED NOT DETECTED Final   Streptococcus pyogenes NOT DETECTED NOT DETECTED Final   A.calcoaceticus-baumannii NOT DETECTED NOT DETECTED Final   Bacteroides fragilis NOT DETECTED NOT DETECTED Final   Enterobacterales DETECTED (A) NOT DETECTED Final    Comment: Enterobacterales represent a large order of gram negative bacteria, not a single organism. CRITICAL RESULT CALLED TO, READ BACK BY AND VERIFIED WITH: AMY THOMPSON AT 0240 ON 06/17/20 SNG    Enterobacter cloacae complex NOT DETECTED NOT DETECTED Final   Escherichia coli NOT DETECTED NOT DETECTED Final   Klebsiella aerogenes NOT DETECTED NOT DETECTED Final   Klebsiella oxytoca NOT DETECTED NOT DETECTED Final   Klebsiella pneumoniae DETECTED (A) NOT DETECTED Final    Comment: CRITICAL RESULT CALLED TO, READ BACK BY AND VERIFIED WITH: AMY THOMPSON RN AT 9735 ON 06/17/20 SNG    Proteus species NOT DETECTED NOT DETECTED Final   Salmonella species NOT DETECTED NOT DETECTED Final   Serratia marcescens NOT DETECTED NOT DETECTED Final   Haemophilus influenzae NOT DETECTED NOT DETECTED Final   Neisseria meningitidis NOT DETECTED NOT DETECTED Final   Pseudomonas aeruginosa NOT DETECTED NOT DETECTED Final   Stenotrophomonas maltophilia NOT DETECTED NOT DETECTED Final   Candida albicans NOT DETECTED NOT DETECTED Final   Candida auris NOT DETECTED NOT DETECTED Final   Candida glabrata NOT DETECTED NOT DETECTED Final   Candida krusei NOT  DETECTED NOT DETECTED Final   Candida parapsilosis NOT DETECTED NOT DETECTED Final   Candida tropicalis NOT DETECTED NOT DETECTED Final   Cryptococcus neoformans/gattii NOT DETECTED NOT DETECTED Final   CTX-M ESBL NOT DETECTED NOT DETECTED Final   Carbapenem resistance IMP NOT DETECTED NOT DETECTED Final   Carbapenem resistance KPC NOT DETECTED NOT DETECTED Final   Carbapenem resistance NDM NOT DETECTED NOT DETECTED Final   Carbapenem resist OXA 48 LIKE NOT DETECTED NOT DETECTED Final   Carbapenem resistance VIM NOT DETECTED NOT DETECTED Final    Comment: Performed at [facility], [address], [place], [place] [address]  Urine Culture     Status: Abnormal   Collection Time: 06/17/20 11:01 AM   Specimen: Urine, Random  Result Value Ref Range Status   Specimen Description   Final    URINE, RANDOM Performed at [facility], [address] [place], [place] [address]    Special Requests   Final    NONE Performed at [facility], [address] [place], [place] [address]    Culture >=100,000 COLONIES/mL KLEBSIELLA PNEUMONIAE (A)  Final   Report Status 06/19/2020 FINAL  Final   Organism ID, Bacteria KLEBSIELLA PNEUMONIAE (A)  Final      Susceptibility   Klebsiella pneumoniae - MIC*    AMPICILLIN RESISTANT Resistant     CEFAZOLIN <=4 SENSITIVE Sensitive     CEFTRIAXONE <=0.25 SENSITIVE Sensitive     CIPROFLOXACIN <=0.25 SENSITIVE Sensitive     GENTAMICIN <=1 SENSITIVE Sensitive     IMIPENEM <=0.25 SENSITIVE Sensitive     NITROFURANTOIN 32  SENSITIVE Sensitive     TRIMETH/SULFA <=20 SENSITIVE Sensitive     AMPICILLIN/SULBACTAM 4 SENSITIVE Sensitive     PIP/TAZO <=4 SENSITIVE Sensitive     * >=100,000 COLONIES/mL KLEBSIELLA PNEUMONIAE  Culture, blood (Routine X 2) w Reflex to ID Panel     Status: None   Collection Time: 06/21/20 11:07 AM   Specimen: BLOOD  Result Value Ref Range Status   Specimen Description BLOOD RIGHT [facility]  Final   Special Requests    Final    BOTTLES DRAWN AEROBIC AND ANAEROBIC Blood Culture adequate volume   Culture   Final    NO GROWTH 5 DAYS Performed at [facility], [address] [place], [place] [address]    Report Status 06/26/2020 FINAL  Final  Culture, blood (Routine X 2) w Reflex to ID Panel     Status: None   Collection Time: 06/21/20 11:09 AM   Specimen: BLOOD  Result Value Ref Range Status   Specimen Description BLOOD [place]  Final   Special Requests   Final    BOTTLES DRAWN AEROBIC AND ANAEROBIC Blood Culture adequate volume   Culture   Final    NO GROWTH 5 DAYS Performed at [facility], [address], [place], [place] [address]    Report Status 06/26/2020 FINAL  Final    Coagulation Studies: No results for input(s): LABPROT, INR in the last 72 hours.  Urinalysis: No results for input(s): COLORURINE, LABSPEC, PHURINE, GLUCOSEU, HGBUR, BILIRUBINUR, KETONESUR, PROTEINUR, UROBILINOGEN, NITRITE, LEUKOCYTESUR in the last 72 hours.  Invalid input(s): APPERANCEUR    Imaging: DG Abd Portable 1V  Result Date: 06/29/2020 CLINICAL DATA:  Small bowel obstruction. EXAM: PORTABLE ABDOMEN - 1 VIEW COMPARISON:  June 27, 2020. FINDINGS: Nasogastric tube tip is seen in the distal stomach. Stable small bowel dilatation is noted concerning for distal small bowel obstruction. Postsurgical changes are seen involving the lumbar spine and sacrum. Right ureteral stent is noted. IMPRESSION: Stable small bowel dilatation is noted concerning for distal small bowel obstruction. Electronically Signed   By: Marijo Conception M.D.   On: 06/29/2020 08:53   DG Abd Portable 2V  Result Date: 06/30/2020 CLINICAL DATA:  History of small-bowel obstruction EXAM: X-RAY ABDOMEN 2 VIEWS COMPARISON:  06/29/2020 FINDINGS: Scattered large and small bowel gas is noted. Persistent small bowel dilatation is seen and stable consistent with at least partial small bowel obstruction. Right ureteral stent in  nasogastric catheter are noted and stable. Postsurgical changes in the thoracolumbar spine are noted. Right femoral dialysis catheter is noted. IMPRESSION: Persistent small bowel obstruction.  No free air is seen. Electronically Signed   By: Inez Catalina M.D.   On: 06/30/2020 08:09     Medications:   . [MAR Hold] sodium chloride 0 mL (06/27/20 1630)  . TPN ADULT (ION) 60 mL/hr at 06/30/20 0758  . TPN ADULT (ION)     . [MAR Hold] vitamin C  500 mg Oral Daily  . [MAR Hold] brimonidine  1 drop Both Eyes BID  . [MAR Hold] chlorhexidine  15 mL Mouth Rinse BID  . [MAR Hold] Chlorhexidine Gluconate Cloth  6 each Topical Q0600  . [MAR Hold] hydrALAZINE  10 mg Intravenous Q6H  . [MAR Hold] insulin aspart  0-9 Units Subcutaneous Q6H  . [MAR Hold] mouth rinse  15 mL Mouth Rinse BID  . [MAR Hold] metoprolol tartrate  5 mg Intravenous Q6H  . [MAR Hold] pantoprazole (PROTONIX) IV  40 mg Intravenous Q24H  . [  MAR Hold] sodium chloride flush  10-40 mL Intracatheter Q12H  . [MAR Hold] sodium chloride flush  3 mL Intravenous Q12H  . [MAR Hold] tamsulosin  0.4 mg Oral Daily  . [MAR Hold] zinc sulfate  220 mg Oral Daily   [MAR Hold] sodium chloride, [MAR Hold] acetaminophen, [MAR Hold] albuterol, fentaNYL (SUBLIMAZE) injection, [MAR Hold] guaiFENesin-dextromethorphan, [MAR Hold] heparin, [MAR Hold]  morphine injection, [MAR Hold] ondansetron (ZOFRAN) IV, ondansetron (ZOFRAN) IV, [MAR Hold] sodium chloride flush  Assessment/ Plan:  Stephanie [name] is a 79 y.o.  female admitted to [facility] for COVID pneumonia. Patient found to have klebsiella sepsis and had right sided pyelonephritis. She underwent right ureteral stent placed on on 9/18.  Patient had IV contrast exposure on 9/17.   # AKI secondary to ATN Patient has AKI, likely secondary to sepsis/obstructive uropathy/did have IV contrast exposure/hypotension. BASELINE Cr 0.90 in January 2021 Received dialysis 1st treatment 06/28/2020  Creatinine today  4.77,slightly improved Electrolyte and volume status acceptable Will assess need for dialysis daily  Lab Results  Component Value Date   CREATININE 4.77 (H) 06/30/2020   CREATININE 4.80 (H) 06/29/2020   CREATININE 4.94 (H) 06/29/2020     # Anemia of renal failure Lab Results  Component Value Date   HGB 8.2 (L) 06/29/2020   Will continue monitoring Consider Epogen starting with next HD  # hyponatremia Hyponatremia Sodium 134  Will continue monitoring  # obstructive uropathy with pyelonephritis Patient is status post right ureteral stent placement by Dr. Jeffie Pollock on September 18   # Hypokalemia K+ improved, 3.7 today   # Covid -19  Pneumonia No respiratory symptoms at present Patient completed Remdesivir treatment Plan to discontinue airborne/contact isolation today       LOS: [place] 9/30/20213:18 PM

## 2020-06-30 NOTE — Progress Notes (Signed)
[facility] Day(s): 13.   Interval History:  Patient seen and examined no acute events or new complaints overnight.  Patient reports she feels "about the same" She continues to have mild lower abdominal soreness and discomfort which is unchanged, mild distension No fever, chills, nausea, emesis  Renal function remains consistent with degree of ERSD.  Electrolyte derangements improving.  Repeat KUB this morning remains consistent with partial SBO.  No NGT output recorded. She is off her COVID precautions today.  She denied any flatus or BM  Vital signs in last 24 hours: [min-max] current  Temp:  [97.6 F (36.4 C)-97.8 F (36.6 C)] 97.6 F (36.4 C) (09/30 0543) Pulse Rate:  [78-91] 90 (09/30 0543) Resp:  [15-20] 17 (09/30 0543) BP: (117-166)/(51-60) 117/51 (09/30 0543) SpO2:  [94 %-95 %] 94 % (09/30 0543)     Height: 5\' 6"  (167.6 cm) Weight: 86.8 kg BMI (Calculated): 30.91   Intake/Output last 2 shifts:  09/29 0701 - 09/30 0700 In: 1635.2 [I.V.:588.8; NG/GT:150; IV Piggyback:896.4] Out: -    Physical Exam:  Constitutional: alert, cooperative and no distress  HENT: normocephalic without obvious abnormality, NGT in place Eyes: PERRL, EOM's grossly intact and symmetric  Respiratory: breathing non-labored at rest  Cardiovascular: regular rate and sinus rhythm  Gastrointestinal:Soft,she continues to have discomfort in her lower abdomen,distension unchanged, certainly no rebound/guarding/peritonitis Musculoskeletal: no edema or wounds, motor and sensation grossly intact, NT    Labs:  CBC Latest Ref Rng & Units 06/29/2020 06/28/2020 06/27/2020  WBC 4.0 - 10.5 K/uL 7.5 6.2 5.3  Hemoglobin 12.0 - 15.0 g/dL 8.2(L) 8.8(L) 9.2(L)  Hematocrit 36 - 46 % 23.2(L) 24.7(L) 24.6(L)  Platelets 150 - 400 K/uL 136(L) 155 157   CMP Latest Ref Rng & Units 06/30/2020 06/29/2020 06/29/2020  Glucose 70 - 99 mg/dL 314(H) - 223(H)  BUN 8 - 23 mg/dL  62(H) - 60(H)  Creatinine 0.44 - 1.00 mg/dL 4.77(H) - 4.80(H)  Sodium 135 - 145 mmol/L 134(L) - 135  Potassium 3.5 - 5.1 mmol/L 3.7 3.2(L) 3.1(L)  Chloride 98 - 111 mmol/L 93(L) - 93(L)  CO2 22 - 32 mmol/L 27 - 27  Calcium 8.9 - 10.3 mg/dL 7.5(L) - 7.3(L)  Total Protein 6.5 - 8.1 g/dL 4.9(L) - -  Total Bilirubin 0.3 - 1.2 mg/dL 0.8 - -  Alkaline Phos 38 - 126 U/L 141(H) - -  AST 15 - 41 U/L 14(L) - -  ALT 0 - 44 U/L 12 - -    Imaging studies:  KUB (06/30/2020) personally reviewed which shows persistent dilation of small bowel, there is some gas in colon, she at a minimal has some degree of partial SBO, and radiologist report reviewed below:  IMPRESSION: Persistent small bowel obstruction.  No free air is seen.   Assessment/Plan: (ICD-10's: K55.609) 79 y.o. female with persistent and unresolved combination of paralytic ileus secondary to electrolyte derangements (improved) and partial SBO given previous abdominal surgeries and CT findings on 09/24, but she is showing signs of clinical improvement this morning, complicated by pertinent comorbidities includingdebilitation, numerousmedicalconditionsnow with ESRD requiring HD   - Unfortunately, she has not made any clinical improvement from a partial SBP standpoint. She remains without definitive bowel function and examination is still somewhat concerning for tenderness and distension. At this point, I do not believe she has made enough progress with conservative measure and she will require exploratory laparotomy. She understands the above and is agreeable with proceeding. We will tentatively plan  this for this afternoon.   - All risks, benefits, and alternatives to above procedure(s) were discussed with the patient, all of her questions were answered to her expressed satisfaction, patient expresses she wishes to proceed, and informed consent was obtained.    - NPO + IVF Resuscitation; Continue TPN  - IV Abx (Cefotetan) on call to OR  -  Monitor abdominal examination; on-going bowel function   - Pain control prn; antiemetics prn   - Hold Heparin  - Further management per primary team    All of the above findings and recommendations were discussed with the patient, and the medical team, and all of patient's questions were answered to her expressed satisfaction.  -- Edison Simon, PA-C [facility] 06/30/2020, 7:18 AM [phone] M-F: 7am - 4pm

## 2020-06-30 NOTE — Treatment Plan (Addendum)
Pt to unit via bed at this time, pt is AxOx4. Pt has noted edema head to toe +2, bilateral arms have abrasions and bruising with swelling noted. Pt states they were not like this prior to hospital admission. Pt has noted edema to feet, +2. Foley in place, IJ to right side of neck, dressing has noted old drainage, serosanguinous noted. Pt has dry skin/cracking to heels at this time. Pt states she had COVID and that's what brought her to hospital. Butch Penny, RN assisted with skin assessment. Tele # [phone] placed on patient. Pt has honeycomb dressing to mid abdominal incision. Pt has sacrum dressing in place with no pressure sore noted. Pt oriented to room and how to call for help at this time. Will continue to monitor closely.   Pt has belongings of shirt and pants in plastic bag at this time upon arrival to unit.

## 2020-06-30 NOTE — Consult Note (Signed)
PHARMACY - TOTAL PARENTERAL NUTRITION CONSULT NOTE   Indication: Small bowel obstruction  Patient Measurements: Height: 5\' 6"  (167.6 cm) Weight: 86.8 kg (191 lb 6.4 oz) IBW/kg (Calculated) : 59.3 TPN AdjBW (KG): 64.5 Body mass index is 30.89 kg/m. 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  Since admission her renal function worsened and she is now requiring hemodialysis, Currently with with combination of paralytic ileus secondary to electrolyte derangements (improved) and partial SBO given previous abdominal surgeries and CT findings on 09/24  Glucose / Insulin:  BG last 24 hours: 223 - 314, required 17 units SSI A1C 8.0 Electrolytes: hyponatremia, hyperphosphatemia improving, magnesium trending up Renal: worsening since admission s/p 1st HD session 9/28 LFTs / TGs: LFTs wnl -  baseline TG 93 Prealbumin / albumin: albumin  2.7 Intake / Output; MIVF:  1635 in, no output recorded - no MIVF GI Imaging:  9/29 Abd xray - stable small bowel dilation noted concerning for distal SBO Surgeries / Procedures:  9/18  right ureteral stent placement possible exploratory laparotomy 9/30 Central access: 06/27/20 - CVC triple lumen R internal jugular TPN start date: 06/28/20  Nutritional Goals (per RD recommendation on 9/28): Kcal: 1900-2100 Protein: 95-105 grams Fluid: UOP + 1 L  Current Nutrition:  NPO   Plan:   Increase TPN rate to 60mL/hr at 1800   Nutritional components: amino acids: 64 g/L (99.8g), dextrose: 19% (296.4g), lipids 39 g/L (60.8g), kCal: 2015  Total volume: 1660 mL (minimum amount possible to accomplich nutrition goals)  Electrolytes in TPN: standard electrolytes except 35mmol/L of Phos, Cl:Ac ratio 1:1    5 units  insulin glargine x 1  Add standard MVI and trace elements to TPN (holding chromium)  Continue sensitive SSI to q6h and adjust as needed   Monitor TPN labs on Mon/Thurs, daily until stable  Dallie Piles, PharmD, BCPS Clinical Pharmacist 06/30/2020 7:11 AM

## 2020-06-30 NOTE — Treatment Plan (Signed)
Jimmy called per pt request.

## 2020-07-01 DIAGNOSIS — N10 Acute tubulo-interstitial nephritis: Secondary | ICD-10-CM | POA: Diagnosis not present

## 2020-07-01 DIAGNOSIS — A419 Sepsis, unspecified organism: Secondary | ICD-10-CM | POA: Diagnosis not present

## 2020-07-01 DIAGNOSIS — Z7189 Other specified counseling: Secondary | ICD-10-CM | POA: Diagnosis not present

## 2020-07-01 DIAGNOSIS — U071 COVID-19: Secondary | ICD-10-CM | POA: Diagnosis not present

## 2020-07-01 DIAGNOSIS — D6869 Other thrombophilia: Secondary | ICD-10-CM | POA: Diagnosis not present

## 2020-07-01 DIAGNOSIS — N179 Acute kidney failure, unspecified: Secondary | ICD-10-CM | POA: Diagnosis not present

## 2020-07-01 DIAGNOSIS — M0579 Rheumatoid arthritis with rheumatoid factor of multiple sites without organ or systems involvement: Secondary | ICD-10-CM

## 2020-07-01 DIAGNOSIS — D84821 Immunodeficiency due to drugs: Secondary | ICD-10-CM

## 2020-07-01 LAB — RENAL FUNCTION PANEL
Albumin: 1.9 g/dL — ABNORMAL LOW (ref 3.5–5.0)
Anion gap: 9 (ref 5–15)
BUN: 74 mg/dL — ABNORMAL HIGH (ref 8–23)
CO2: 27 mmol/L (ref 22–32)
Calcium: 7.8 mg/dL — ABNORMAL LOW (ref 8.9–10.3)
Chloride: 97 mmol/L — ABNORMAL LOW (ref 98–111)
Creatinine, Ser: 4.5 mg/dL — ABNORMAL HIGH (ref 0.44–1.00)
GFR calc Af Amer: 10 mL/min — ABNORMAL LOW (ref >60)
GFR calc non Af Amer: 9 mL/min — ABNORMAL LOW (ref >60)
Glucose, Bld: 371 mg/dL — ABNORMAL HIGH (ref 70–99)
Phosphorus: 4.6 mg/dL (ref 2.5–4.6)
Potassium: 4.1 mmol/L (ref 3.5–5.1)
Sodium: 133 mmol/L — ABNORMAL LOW (ref 135–145)

## 2020-07-01 LAB — HEMOGLOBIN AND HEMATOCRIT, BLOOD
HCT: 25.5 % — ABNORMAL LOW (ref 36.0–46.0)
Hemoglobin: 9.1 g/dL — ABNORMAL LOW (ref 12.0–15.0)

## 2020-07-01 LAB — CBC
HCT: 20.8 % — ABNORMAL LOW (ref 36.0–46.0)
Hemoglobin: 7.1 g/dL — ABNORMAL LOW (ref 12.0–15.0)
MCH: 27.6 pg (ref 26.0–34.0)
MCHC: 34.1 g/dL (ref 30.0–36.0)
MCV: 80.9 fL (ref 80.0–100.0)
Platelets: 145 10*3/uL — ABNORMAL LOW (ref 150–400)
RBC: 2.57 MIL/uL — ABNORMAL LOW (ref 3.87–5.11)
RDW: 16.5 % — ABNORMAL HIGH (ref 11.5–15.5)
WBC: 7.6 10*3/uL (ref 4.0–10.5)
nRBC: 0 % (ref 0.0–0.2)

## 2020-07-01 LAB — GLUCOSE, CAPILLARY
Glucose-Capillary: 308 mg/dL — ABNORMAL HIGH (ref 70–99)
Glucose-Capillary: 325 mg/dL — ABNORMAL HIGH (ref 70–99)
Glucose-Capillary: 334 mg/dL — ABNORMAL HIGH (ref 70–99)
Glucose-Capillary: 346 mg/dL — ABNORMAL HIGH (ref 70–99)
Glucose-Capillary: 371 mg/dL — ABNORMAL HIGH (ref 70–99)

## 2020-07-01 LAB — BASIC METABOLIC PANEL
Anion gap: 10 (ref 5–15)
BUN: 74 mg/dL — ABNORMAL HIGH (ref 8–23)
CO2: 26 mmol/L (ref 22–32)
Calcium: 7.7 mg/dL — ABNORMAL LOW (ref 8.9–10.3)
Chloride: 96 mmol/L — ABNORMAL LOW (ref 98–111)
Creatinine, Ser: 4.84 mg/dL — ABNORMAL HIGH (ref 0.44–1.00)
GFR calc Af Amer: 9 mL/min — ABNORMAL LOW (ref >60)
GFR calc non Af Amer: 8 mL/min — ABNORMAL LOW (ref >60)
Glucose, Bld: 367 mg/dL — ABNORMAL HIGH (ref 70–99)
Potassium: 4.1 mmol/L (ref 3.5–5.1)
Sodium: 132 mmol/L — ABNORMAL LOW (ref 135–145)

## 2020-07-01 LAB — PREPARE RBC (CROSSMATCH)

## 2020-07-01 LAB — ABO/RH: ABO/RH(D): O POS

## 2020-07-01 LAB — MAGNESIUM: Magnesium: 2 mg/dL (ref 1.7–2.4)

## 2020-07-01 MED ORDER — INSULIN DETEMIR 100 UNIT/ML ~~LOC~~ SOLN
4.0000 [IU] | Freq: Two times a day (BID) | SUBCUTANEOUS | Status: DC
  Administered 2020-07-01 – 2020-07-02 (×3): 4 [IU] via SUBCUTANEOUS
  Filled 2020-07-01 (×4): qty 0.04

## 2020-07-01 MED ORDER — EPOETIN ALFA 10000 UNIT/ML IJ SOLN
10000.0000 [IU] | INTRAMUSCULAR | Status: DC
  Administered 2020-07-01 – 2020-07-08 (×2): 10000 [IU] via SUBCUTANEOUS
  Filled 2020-07-01 (×2): qty 1

## 2020-07-01 MED ORDER — TRACE MINERALS CU-MN-SE-ZN 300-55-60-3000 MCG/ML IV SOLN
INTRAVENOUS | Status: AC
  Filled 2020-07-01: qty 665.6

## 2020-07-01 MED ORDER — SODIUM CHLORIDE 0.9% IV SOLUTION: Freq: Once | INTRAVENOUS | Status: AC

## 2020-07-01 MED ORDER — INSULIN ASPART 100 UNIT/ML ~~LOC~~ SOLN
0.0000 [IU] | SUBCUTANEOUS | Status: DC
  Administered 2020-07-01 (×2): 11 [IU] via SUBCUTANEOUS
  Administered 2020-07-01: 15 [IU] via SUBCUTANEOUS
  Administered 2020-07-02: 11 [IU] via SUBCUTANEOUS
  Administered 2020-07-02 (×4): 8 [IU] via SUBCUTANEOUS
  Administered 2020-07-02: 5 [IU] via SUBCUTANEOUS
  Administered 2020-07-02 – 2020-07-03 (×2): 11 [IU] via SUBCUTANEOUS
  Administered 2020-07-03: 5 [IU] via SUBCUTANEOUS
  Administered 2020-07-03: 8 [IU] via SUBCUTANEOUS
  Administered 2020-07-03: 15 [IU] via SUBCUTANEOUS
  Administered 2020-07-03: 11 [IU] via SUBCUTANEOUS
  Administered 2020-07-04 (×6): 8 [IU] via SUBCUTANEOUS
  Administered 2020-07-05: 3 [IU] via SUBCUTANEOUS
  Administered 2020-07-05: 5 [IU] via SUBCUTANEOUS
  Administered 2020-07-05: 3 [IU] via SUBCUTANEOUS
  Administered 2020-07-05: 5 [IU] via SUBCUTANEOUS
  Administered 2020-07-05 (×2): 3 [IU] via SUBCUTANEOUS
  Administered 2020-07-06 (×4): 2 [IU] via SUBCUTANEOUS
  Filled 2020-07-01 (×32): qty 1

## 2020-07-01 NOTE — TOC Progression Note (Addendum)
Transition of Care [facility]) - Progression Note    Patient Details  Name: DAVIANA HAYMAKER MRN: [id number] Date of Birth: 1941/06/07  Transition of [facility]) CM/SW Contact  Shade Flood, LCSW Phone Number: 07/01/2020, 2:12 PM  Clinical Narrative:     TOC following. Notified by Palliative APNP that pt agreeable to SNF rehab at [facility] at dc. Updated Tammy at [facility] and they are expecting her once pt stable. DC timeframe not yet known. TOC to refer for outpatient palliative follow up at dc as well.  Assigned TOC will follow.  Expected Discharge Plan: [place] Barriers to Discharge: Continued Medical Work up  Expected Discharge Plan and Services Expected Discharge Plan: [place] In-house Referral: NA   Post Acute Care Choice: NA Living arrangements for the past 2 months: Single Family Home                                       Social Determinants of Health (SDOH) Interventions    Readmission Risk Interventions Readmission Risk Prevention Plan 06/21/2020 06/19/2020  Transportation Screening Complete Complete  PCP or Specialist Appt within 3-5 Days Complete -  HRI or Home Care Consult Complete -  Social Work Consult for [place] Planning/Counseling Complete -  Palliative Care Screening Not Applicable -  Medication Review Press photographer) (No Data) Complete  PCP or Specialist appointment within 3-5 days of discharge - Complete  HRI or [place] - Complete  SW Recovery Care/Counseling Consult - Complete  [place] - Not Applicable  Some recent data might be hidden

## 2020-07-01 NOTE — Progress Notes (Signed)
[facility] Day(s): [place] op day(s): 1 Day Post-Op.   Interval History:  Patient seen and examined no acute events or new complaints overnight.  Patient reports she is feeling a little better this morning Still with distension No fever, chills, nausea, or emesis No leukocytosis on CBC, Hgb 7.1 but I suspect this is dilutional Renal function consistent with degree of ERSD, sCr - 4.84, UO 600 ccs this morning NGT with only 5 ccs recorded No signs of bowel function Has not mobilized yet  Vital signs in last 24 hours: [min-max] current  Temp:  [96.8 F (36 C)-98.4 F (36.9 C)] 98.4 F (36.9 C) (10/01 0727) Pulse Rate:  [79-96] 87 (10/01 0727) Resp:  [13-26] 18 (10/01 0317) BP: (134-180)/(45-82) 134/58 (10/01 0727) SpO2:  [90 %-99 %] 97 % (10/01 0727)     Height: 5\' 6"  (167.6 cm) Weight: 86.8 kg BMI (Calculated): 30.91   Intake/Output last 2 shifts:  09/30 0701 - 10/01 0700 In: 2260.3 [I.V.:2060.3; IV Piggyback:200] Out: 465 [Urine:450; Emesis/NG output:5; Blood:10]   Physical Exam:  Constitutional: alert, cooperative and no distress  HENT: normocephalic without obvious abnormality, NGT in place Eyes: PERRL, EOM's grossly intact and symmetric  Respiratory: breathing non-labored at rest  Cardiovascular: regular rate and sinus rhythm  Gastrointestinal:Soft,no appreciable soreness, she remains distended and tympanic, certainly no rebound/guarding/peritonitis Integumentary: Laparotomy incision is CDI with staples, minimal drainage near the umbilicus on honeycomb Musculoskeletal: no edema or wounds, motor and sensation grossly intact, NT  Labs:  CBC Latest Ref Rng & Units 07/01/2020 06/29/2020 06/28/2020  WBC 4.0 - 10.5 K/uL 7.6 7.5 6.2  Hemoglobin 12.0 - 15.0 g/dL 7.1(L) 8.2(L) 8.8(L)  Hematocrit 36 - 46 % 20.8(L) 23.2(L) 24.7(L)  Platelets 150 - 400 K/uL 145(L) 136(L) 155   CMP Latest Ref Rng & Units 07/01/2020 07/01/2020  06/30/2020  Glucose 70 - 99 mg/dL 371(H) 367(H) 314(H)  BUN 8 - 23 mg/dL 74(H) 74(H) 62(H)  Creatinine 0.44 - 1.00 mg/dL 4.50(H) 4.84(H) 4.77(H)  Sodium 135 - 145 mmol/L 133(L) 132(L) 134(L)  Potassium 3.5 - 5.1 mmol/L 4.1 4.1 3.7  Chloride 98 - 111 mmol/L 97(L) 96(L) 93(L)  CO2 22 - 32 mmol/L 27 26 27   Calcium 8.9 - 10.3 mg/dL 7.8(L) 7.7(L) 7.5(L)  Total Protein 6.5 - 8.1 g/dL - - 4.9(L)  Total Bilirubin 0.3 - 1.2 mg/dL - - 0.8  Alkaline Phos 38 - 126 U/L - - 141(H)  AST 15 - 41 U/L - - 14(L)  ALT 0 - 44 U/L - - 12    Imaging studies: No new pertinent imaging studies   Assessment/Plan:  79 y.o. female 1 Day Post-Op s/p exploratory laparotomy and lysis of adhesions for partial small bowel obstruction secondary to post-surgical adhesions, complicated by pertinent comorbidities including debilitation, numerousmedicalconditionsnow with ESRD requiring HD.   - Continue NGT decompression for now; LIS; monitor and record output --> Once she starts to pass gas, we can move towards removing this.    - Continue TPN; NPO   - Monitor abdominal examination; on-going bowel function              - Pain control prn; antiemetics prn   - Mobilization encouraged; engage PT if needed  - Discontinue foley             - Further management per primary team    All of the above findings and recommendations were discussed with the patient, and the medical  team, and all of patient's questions were answered to her expressed satisfaction.  -- Edison Simon, PA-C [facility] 07/01/2020, 7:30 AM [phone] M-F: 7am - 4pm

## 2020-07-01 NOTE — Treatment Plan (Signed)
Re drew H and H since it did not make it to lab via tube station

## 2020-07-01 NOTE — Progress Notes (Addendum)
Inpatient Diabetes Program Recommendations  AACE/ADA: New Consensus Statement on Inpatient Glycemic Control (2015)  Target Ranges:  Prepandial:   less than 140 mg/dL      [facility] postprandial:   less than 180 mg/dL (1-2 hours)      Critically ill patients:  140 - 180 mg/dL   Results for Stephanie [name], Stephanie [name] (MRN [id number]) as of 07/01/2020 07:48  Ref. Range 06/30/2020 00:21 06/30/2020 03:55 06/30/2020 07:57 06/30/2020 11:30 06/30/2020 13:16 06/30/2020 15:13 06/30/2020 17:30  Glucose-Capillary Latest Ref Range: 70 - 99 mg/dL 290 (H)  5 units NOVOLOG  294 (H)  5 units NOVOLOG @5 :44am 279 (H) 341 (H)  7 units NOVOLOG +  5 units LANTUS  329 (H)  5 units NOVOLOG  319 (H)  7 units NOVOLOG  319 (H)  7 units NOVOLOG    Results for Stephanie [name], Stephanie [name] (MRN [id number]) as of 07/01/2020 07:48  Ref. Range 06/30/2020 23:35 07/01/2020 05:14  Glucose-Capillary Latest Ref Range: 70 - 99 mg/dL 341 (H)  7 units NOVOLOG  346 (H)  7 units NOVOLOG     Admit with: Sepsis/ Acute Renal FaiIure/ COVID+/ SBO  History: DM  Home DM Meds: Glipizide 10 mg Daily       Metformin XR 500 mg BID  Current Orders: Novolog Sensitive Correction Scale/ SSI (0-9 units) Q6 hours     MD/ Pharmacy:  Note TPN infusing 65cc/hr (No Insulin in the TPN)  CBGs >300 since yesterday   Please consider the following:  1. Increase the Novolog SSI to the Moderate scale (0-15 units) and Increase the frequency of the Novolog SSi to Q4 hours (currently only ordered Q6 hours)  2. May also consider adding low dose basal insulin while pt's home oral meds are on hold: Levemir 4 units BID (0.1 units/kg based on last available weight)    --Will follow patient during hospitalization--  Wyn Quaker RN, MSN, CDE Diabetes Coordinator Inpatient Glycemic Control Team Team Pager: [phone] (8a-5p)

## 2020-07-01 NOTE — Progress Notes (Signed)
Physical Therapy Treatment Patient Details Name: Stephanie [name] MRN: [id number] DOB: 07/25/1941 Today's Date: 07/01/2020    History of Present Illness Stephanie [name] is a 80 y/o F w/ PMH: COVID infection, DM, RA, HTN, remote colon CA, who comes to [facility] on 9/17. Patient found to have klebsiella sepsis and had right sided pyelonephritis. She underwent right ureteral stent placed on on 9/18. Pt with acute on chronic renal failure. Temporary fem cath placed 9/27. Pt also with SBO requiring TPN so R IJ placed 9/27 as well. Pt s/p exploratory lap and lysis of adhesions 9/30 with continue at transfer order in place.    PT Comments    Pt presents supine in bed on arrival to room. She is agreeable to bed level exercises due to temporary femoral catheter. Pt performed LE exercises with instructions to perform throughout her day. Pt will benefit from skilled PT services to address deficits in strength, balance, and decrease risk for future falls.   Follow Up Recommendations  SNF;Supervision for mobility/OOB;Supervision - Intermittent     Equipment Recommendations  None recommended by PT    Recommendations for Other Services       Precautions / Restrictions Precautions Precautions: Fall Precaution Comments: Pt has temp R femoral catheter Restrictions Weight Bearing Restrictions: No Other Position/Activity Restrictions: Pt with temporary femcath to R LE, no sitting up. Also w/ R IJ.    Mobility  Bed Mobility               General bed mobility comments: not performed as pt with R temporary fem-cath  Transfers                 General transfer comment: Not performed due to R temporary fem-cath  Ambulation/Gait             General Gait Details: Not performed due to temp R fem-cath   Stairs             Wheelchair Mobility    Modified Rankin (Stroke Patients Only)       Balance       Sitting balance - Comments: Did not assess sitting balance this session        Standing balance comment: Did not assess standing balance this session                            Cognition Arousal/Alertness: Awake/alert Behavior During Therapy: WFL for tasks assessed/performed Overall Cognitive Status: Within Functional Limits for tasks assessed                                 General Comments: Patient is Stephanie [name] and requires some increased processing time      Exercises General Exercises - Lower Extremity Ankle Circles/Pumps: AROM;Both;10 reps;Supine Quad Sets: AROM;Both;20 reps;Supine Gluteal Sets: AROM;Both;10 reps;Supine Hip ABduction/ADduction: AAROM;Right;5 reps;Supine Other Exercises Other Exercises: OT facilitates pt participation in bed level UB exercise with very light/gentle use of lateral core to reduce pressure to surgical site. OT engages pt in 1 set x10 reps alternating punches, 1 set x10 reps straight arm raises, 1 set x10 reps alternating contralateral reaching, and 1 set x10 reps hand squeezes with UEs elevated to help with edema mgt. Pt does tolerate better this date but is still grossly weak. Requires MOD verbal/visual cues for form/pace. ~2-3 minute rest breaks between each exercise. Other Exercises: OT facilitates improved positioning of  UEs on two pillows each to assist with edema mgt and notifies RN.    General Comments General comments (skin integrity, edema, etc.): Pt's R wrist swollen and red with bruises along her R forearm; some bruises on her L arm      Pertinent Vitals/Pain Pain Assessment: No/denies pain Faces Pain Scale: Hurts little more Pain Location: R hand/wrist and abdomen Pain Descriptors / Indicators: Sore;Discomfort Pain Intervention(s): Limited activity within patient's tolerance;Monitored during session    Home Living                      Prior Function            PT Goals (current goals can now be found in the care plan section) Acute Rehab PT Goals Patient Stated Goal: go  home PT Goal Formulation: With patient Time For Goal Achievement: 07/07/20 Potential to Achieve Goals: Fair Progress towards PT goals: Progressing toward goals    Frequency    Min 2X/week      PT Plan Current plan remains appropriate    Co-evaluation              AM-PAC PT "6 Clicks" Mobility   Outcome Measure  Help needed turning from your back to your side while in a flat bed without using bedrails?: A Lot Help needed moving from lying on your back to sitting on the side of a flat bed without using bedrails?: A Lot Help needed moving to and from a bed to a chair (including a wheelchair)?: Total Help needed standing up from a chair using your arms (e.g., wheelchair or bedside chair)?: Total Help needed to walk in hospital room?: Total Help needed climbing 3-5 steps with a railing? : Total 6 Click Score: 8    End of Session Equipment Utilized During Treatment: Oxygen Activity Tolerance: Patient tolerated treatment well Patient left: in bed;with call bell/phone within reach;with bed alarm set Nurse Communication: Mobility status PT Visit Diagnosis: Other abnormalities of gait and mobility (R26.89);Muscle weakness (generalized) (M62.81);Dizziness and giddiness (R42);Difficulty in walking, not elsewhere classified (R26.2)     Time: 9169-4503 PT Time Calculation (min) (ACUTE ONLY): 15 min  Charges:                         Noemi Chapel, SPT Bernita Raisin 07/01/2020, 12:02 PM

## 2020-07-01 NOTE — Progress Notes (Signed)
[facility]  ROUNDING NOTE   Subjective:   Stephanie [name] is a 79 y.o. female who presents with small bowel obstruction associated with COVID-19.     Patient resting in bed,in no acute distress. F/C draining dark amber urine.NGT with greenish output.  Underwent ex lap on September 30 for takedown of adhesions.  Small bowel was completely stuck and fused to the anterior abdominal wall.  Extensive lysis of adhesions was conducted  09/30 0701 - 10/01 0700 In: 2260.3 [I.V.:2060.3; IV Piggyback:200] Out: 465 [Urine:450; Emesis/NG output:5; Blood:10]   Objective:  Vital signs in last 24 hours:  Temp:  [96.8 F (36 C)-98.4 F (36.9 C)] 98 F (36.7 C) (10/01 1338) Pulse Rate:  [79-94] 88 (10/01 1338) Resp:  [13-26] 20 (10/01 1338) BP: (125-153)/(47-62) 137/56 (10/01 1338) SpO2:  [90 %-100 %] 96 % (10/01 1338)  Weight change:  Filed Weights   06/19/20 0625 06/21/20 0125 06/22/20 0453  Weight: 80.4 kg 82.2 kg 86.8 kg    Intake/Output: I/O last 3 completed shifts: In: 2807.3 [I.V.:2315.3; IV Piggyback:492] Out: 465 [Urine:450; Emesis/NG output:5; Blood:10]   Intake/Output this shift:  Total I/O In: 417 [Blood:342; NG/GT:75] Out: 35 [Urine:600; Emesis/NG output:10]  Physical Exam: General:  Chronically ill appearing  Head: Normocephalic, atraumatic.NGT in place  Lungs:  Normal and symmetrical respirations  Heart: S1S2, regular  Abdomen:  Mild distension +, tenderness +  Extremities:  No peripheral edema.  Neurologic: Nonfocal, moving all four extremities  Skin: No new rashes or lesions  Access: Rt fem temp dialysis  catheter placed on 1/54/00    Basic Metabolic Panel: Recent Labs  Lab 06/28/20 0530 06/28/20 1222 06/29/20 0645 06/29/20 0645 06/29/20 1253 06/29/20 1658 06/30/20 0600 07/01/20 0526  NA 133*  --  135  --  135  --  134* 133*  132*  K 2.5*   < > 2.5*  --  3.1* 3.2* 3.7 4.1  4.1  CL 87*  --  92*  --  93*  --  93* 97*  96*  CO2 27  --   29  --  27  --  27 27  26   GLUCOSE 131*  --  197*  --  223*  --  314* 371*  367*  BUN 91*  --  62*  --  60*  --  62* 74*  74*  CREATININE 6.33*  --  4.94*  --  4.80*  --  4.77* 4.50*  4.84*  CALCIUM 6.5*  --  7.1*   < > 7.3*  --  7.5* 7.8*  7.7*  MG 1.8  --  1.8  --   --  2.1 2.1 2.0  PHOS 9.3*  --  6.5*  --   --   --  5.1* 4.6   < > = values in this interval not displayed.    Liver Function Tests: Recent Labs  Lab 06/26/20 0510 06/29/20 0645 06/30/20 0600 07/01/20 0526  AST 18 14* 14*  --   ALT 14 13 12   --   ALKPHOS 179* 150* 141*  --   BILITOT 0.9 0.9 0.8  --   PROT 5.0* 4.6* 4.9*  --   ALBUMIN 2.7* 2.1* 2.2* 1.9*   No results for input(s): LIPASE, AMYLASE in the last 168 hours. No results for input(s): AMMONIA in the last 168 hours.  CBC: Recent Labs  Lab 06/26/20 0510 06/27/20 0545 06/28/20 0530 06/29/20 0645 07/01/20 0526  WBC 6.7 5.3 6.2 7.5 7.6  NEUTROABS  --   --   --  6.4  --   HGB 8.9* 9.2* 8.8* 8.2* 7.1*  HCT 24.0* 24.6* 24.7* 23.2* 20.8*  MCV 72.9* 73.2* 76.5* 77.1* 80.9  PLT 184 157 155 136* 145*    Cardiac Enzymes: No results for input(s): CKTOTAL, CKMB, CKMBINDEX, TROPONINI in the last 168 hours.  BNP: Invalid input(s): POCBNP  CBG: Recent Labs  Lab 06/30/20 1513 06/30/20 1730 06/30/20 2335 07/01/20 0514 07/01/20 1148  GLUCAP 319* 319* 341* 346* 334*    Microbiology: Results for orders placed or performed during the hospital encounter of 06/17/20  Blood culture (routine x 2)     Status: Abnormal   Collection Time: 06/17/20  3:57 AM   Specimen: BLOOD  Result Value Ref Range Status   Specimen Description   Final    BLOOD LEFT HAND Performed at [facility], [address], [place], [place] [address]    Special Requests   Final    BOTTLES DRAWN AEROBIC AND ANAEROBIC Blood Culture adequate volume Performed at [facility], [address] [place], [place] [address]    Culture  Setup Time   Final     Organism ID to follow GRAM NEGATIVE RODS IN BOTH AEROBIC AND ANAEROBIC BOTTLES CRITICAL RESULT CALLED TO, READ BACK BY AND VERIFIED WITH: AMY THOMPSON AT 1534 ON 06/17/20 [place] Performed at [facility], [address] [place], [place] [address]    Culture KLEBSIELLA PNEUMONIAE (A)  Final   Report Status 06/19/2020 FINAL  Final   Organism ID, Bacteria KLEBSIELLA PNEUMONIAE  Final      Susceptibility   Klebsiella pneumoniae - MIC*    AMPICILLIN RESISTANT Resistant     CEFAZOLIN <=4 SENSITIVE Sensitive     CEFEPIME <=0.12 SENSITIVE Sensitive     CEFTAZIDIME <=1 SENSITIVE Sensitive     CEFTRIAXONE <=0.25 SENSITIVE Sensitive     CIPROFLOXACIN <=0.25 SENSITIVE Sensitive     GENTAMICIN <=1 SENSITIVE Sensitive     IMIPENEM <=0.25 SENSITIVE Sensitive     TRIMETH/SULFA <=20 SENSITIVE Sensitive     AMPICILLIN/SULBACTAM 4 SENSITIVE Sensitive     PIP/TAZO <=4 SENSITIVE Sensitive     * KLEBSIELLA PNEUMONIAE  Blood culture (routine x 2)     Status: Abnormal   Collection Time: 06/17/20  3:57 AM   Specimen: BLOOD  Result Value Ref Range Status   Specimen Description   Final    BLOOD RIGHT [facility] Performed at [facility], [address], [place], [place] [address]    Special Requests   Final    BOTTLES DRAWN AEROBIC AND ANAEROBIC Blood Culture adequate volume Performed at [facility], [address] [place], [place] [address]    Culture  Setup Time   Final    GRAM NEGATIVE RODS IN BOTH AEROBIC AND ANAEROBIC BOTTLES CRITICAL VALUE NOTED.  VALUE IS CONSISTENT WITH PREVIOUSLY REPORTED AND CALLED VALUE. Performed at [facility], [address] [place], [place] [address]    Culture (A)  Final    KLEBSIELLA PNEUMONIAE SUSCEPTIBILITIES PERFORMED ON PREVIOUS CULTURE WITHIN THE LAST 5 DAYS. Performed at [facility], [place] [address] [place], [place] [address]    Report Status 06/19/2020 FINAL  Final  Blood Culture ID Panel (Reflexed)     Status:  Abnormal   Collection Time: 06/17/20  3:57 AM  Result Value Ref Range Status   Enterococcus faecalis NOT DETECTED NOT DETECTED Final   Enterococcus Faecium NOT DETECTED NOT DETECTED Final   Listeria monocytogenes NOT DETECTED NOT DETECTED Final  Staphylococcus species NOT DETECTED NOT DETECTED Final   Staphylococcus aureus (BCID) NOT DETECTED NOT DETECTED Final   Staphylococcus epidermidis NOT DETECTED NOT DETECTED Final   Staphylococcus lugdunensis NOT DETECTED NOT DETECTED Final   Streptococcus species NOT DETECTED NOT DETECTED Final   Streptococcus agalactiae NOT DETECTED NOT DETECTED Final   Streptococcus pneumoniae NOT DETECTED NOT DETECTED Final   Streptococcus pyogenes NOT DETECTED NOT DETECTED Final   A.calcoaceticus-baumannii NOT DETECTED NOT DETECTED Final   Bacteroides fragilis NOT DETECTED NOT DETECTED Final   Enterobacterales DETECTED (A) NOT DETECTED Final    Comment: Enterobacterales represent a large order of gram negative bacteria, not a single organism. CRITICAL RESULT CALLED TO, READ BACK BY AND VERIFIED WITH: AMY THOMPSON AT 2725 ON 06/17/20 SNG    Enterobacter cloacae complex NOT DETECTED NOT DETECTED Final   Escherichia coli NOT DETECTED NOT DETECTED Final   Klebsiella aerogenes NOT DETECTED NOT DETECTED Final   Klebsiella oxytoca NOT DETECTED NOT DETECTED Final   Klebsiella pneumoniae DETECTED (A) NOT DETECTED Final    Comment: CRITICAL RESULT CALLED TO, READ BACK BY AND VERIFIED WITH: AMY THOMPSON RN AT 3664 ON 06/17/20 SNG    Proteus species NOT DETECTED NOT DETECTED Final   Salmonella species NOT DETECTED NOT DETECTED Final   Serratia marcescens NOT DETECTED NOT DETECTED Final   Haemophilus influenzae NOT DETECTED NOT DETECTED Final   Neisseria meningitidis NOT DETECTED NOT DETECTED Final   Pseudomonas aeruginosa NOT DETECTED NOT DETECTED Final   Stenotrophomonas maltophilia NOT DETECTED NOT DETECTED Final   Candida albicans NOT DETECTED NOT DETECTED Final    Candida auris NOT DETECTED NOT DETECTED Final   Candida glabrata NOT DETECTED NOT DETECTED Final   Candida krusei NOT DETECTED NOT DETECTED Final   Candida parapsilosis NOT DETECTED NOT DETECTED Final   Candida tropicalis NOT DETECTED NOT DETECTED Final   Cryptococcus neoformans/gattii NOT DETECTED NOT DETECTED Final   CTX-M ESBL NOT DETECTED NOT DETECTED Final   Carbapenem resistance IMP NOT DETECTED NOT DETECTED Final   Carbapenem resistance KPC NOT DETECTED NOT DETECTED Final   Carbapenem resistance NDM NOT DETECTED NOT DETECTED Final   Carbapenem resist OXA 48 LIKE NOT DETECTED NOT DETECTED Final   Carbapenem resistance VIM NOT DETECTED NOT DETECTED Final    Comment: Performed at [facility], [address], [place], [place] [address]  Urine Culture     Status: Abnormal   Collection Time: 06/17/20 11:01 AM   Specimen: Urine, Random  Result Value Ref Range Status   Specimen Description   Final    URINE, RANDOM Performed at [facility], [address] [place], [place] [address]    Special Requests   Final    NONE Performed at [facility], [address] [place], [place] [address]    Culture >=100,000 COLONIES/mL KLEBSIELLA PNEUMONIAE (A)  Final   Report Status 06/19/2020 FINAL  Final   Organism ID, Bacteria KLEBSIELLA PNEUMONIAE (A)  Final      Susceptibility   Klebsiella pneumoniae - MIC*    AMPICILLIN RESISTANT Resistant     CEFAZOLIN <=4 SENSITIVE Sensitive     CEFTRIAXONE <=0.25 SENSITIVE Sensitive     CIPROFLOXACIN <=0.25 SENSITIVE Sensitive     GENTAMICIN <=1 SENSITIVE Sensitive     IMIPENEM <=0.25 SENSITIVE Sensitive     NITROFURANTOIN 32 SENSITIVE Sensitive     TRIMETH/SULFA <=20 SENSITIVE Sensitive     AMPICILLIN/SULBACTAM 4 SENSITIVE Sensitive     PIP/TAZO <=4 SENSITIVE Sensitive     * >=  100,000 COLONIES/mL KLEBSIELLA PNEUMONIAE  Culture, blood (Routine X 2) w Reflex to ID Panel     Status: None   Collection Time: 06/21/20  11:07 AM   Specimen: BLOOD  Result Value Ref Range Status   Specimen Description BLOOD RIGHT [facility]  Final   Special Requests   Final    BOTTLES DRAWN AEROBIC AND ANAEROBIC Blood Culture adequate volume   Culture   Final    NO GROWTH 5 DAYS Performed at [facility], [address] [place], [place] [address]    Report Status 06/26/2020 FINAL  Final  Culture, blood (Routine X 2) w Reflex to ID Panel     Status: None   Collection Time: 06/21/20 11:09 AM   Specimen: BLOOD  Result Value Ref Range Status   Specimen Description BLOOD [place]  Final   Special Requests   Final    BOTTLES DRAWN AEROBIC AND ANAEROBIC Blood Culture adequate volume   Culture   Final    NO GROWTH 5 DAYS Performed at [facility], [address] [place],  [address]    Report Status 06/26/2020 FINAL  Final    Coagulation Studies: No results for input(s): LABPROT, INR in the last 72 hours.  Urinalysis: No results for input(s): COLORURINE, LABSPEC, PHURINE, GLUCOSEU, HGBUR, BILIRUBINUR, KETONESUR, PROTEINUR, UROBILINOGEN, NITRITE, LEUKOCYTESUR in the last 72 hours.  Invalid input(s): APPERANCEUR    Imaging: DG Abd Portable 2V  Result Date: 06/30/2020 CLINICAL DATA:  History of small-bowel obstruction EXAM: X-RAY ABDOMEN 2 VIEWS COMPARISON:  06/29/2020 FINDINGS: Scattered large and small bowel gas is noted. Persistent small bowel dilatation is seen and stable consistent with at least partial small bowel obstruction. Right ureteral stent in nasogastric catheter are noted and stable. Postsurgical changes in the thoracolumbar spine are noted. Right femoral dialysis catheter is noted. IMPRESSION: Persistent small bowel obstruction.  No free air is seen. Electronically Signed   By: Inez Catalina M.D.   On: 06/30/2020 08:09     Medications:   . sodium chloride 0 mL (06/27/20 1630)  . TPN ADULT (ION) 65 mL/hr at 07/01/20 0236  . TPN ADULT (ION)     . vitamin C  500 mg Oral Daily  .  brimonidine  1 drop Both Eyes BID  . chlorhexidine  15 mL Mouth Rinse BID  . Chlorhexidine Gluconate Cloth  6 each Topical Q0600  . hydrALAZINE  10 mg Intravenous Q6H  . insulin aspart  0-15 Units Subcutaneous Q4H  . insulin detemir  4 Units Subcutaneous BID  . mouth rinse  15 mL Mouth Rinse BID  . metoprolol tartrate  5 mg Intravenous Q6H  . pantoprazole (PROTONIX) IV  40 mg Intravenous Q24H  . sodium chloride flush  10-40 mL Intracatheter Q12H  . sodium chloride flush  3 mL Intravenous Q12H  . tamsulosin  0.4 mg Oral Daily  . zinc sulfate  220 mg Oral Daily   sodium chloride, acetaminophen, albuterol, guaiFENesin-dextromethorphan, heparin, morphine injection, ondansetron (ZOFRAN) IV, sodium chloride flush  Assessment/ Plan:  Ms. Stephanie [name] is a 79 y.o.  female admitted to [facility] for COVID pneumonia. Patient found to have klebsiella sepsis and had right sided pyelonephritis. She underwent right ureteral stent placed on on 9/18.  Patient had IV contrast exposure on 9/17.   # AKI secondary to ATN Patient has AKI, likely secondary to sepsis/obstructive uropathy/did have IV contrast exposure/hypotension. BASELINE Cr 0.90 in January 2021 Received dialysis 1st treatment 06/28/2020  Creatinine today is slightly  improved Urine output recorded at 600 cc Electrolyte and volume status acceptable Will assess need for dialysis daily We will consider discontinuing dialysis catheter after the weekend  Lab Results  Component Value Date   CREATININE 4.50 (H) 07/01/2020   CREATININE 4.84 (H) 07/01/2020   CREATININE 4.77 (H) 06/30/2020     # Anemia of renal failure Lab Results  Component Value Date   HGB 7.1 (L) 07/01/2020   Will continue monitoring We will get her EPO dose subcutaneously Check iron levels tomorrow  # hyponatremia Hyponatremia Sodium 133 Will continue monitoring  # obstructive uropathy with pyelonephritis Patient is status post right ureteral stent placement by  Dr. Jeffie Pollock on September 18   # Covid -19  Pneumonia No respiratory symptoms at present Patient completed Remdesivir treatment Off airborne and contact precautions now  #Small bowel obstruction Status post ex lap on September 30 for takedown of adhesions.  Small bowel was completely stuck and fused to the anterior abdominal wall.  Extensive lysis of adhesions was conducted       LOS: [place] 10/1/20213:04 PM

## 2020-07-01 NOTE — Progress Notes (Signed)
Palliative:   [name] is lying quietly in bed.  She greets me making and somewhat keeping eye contact.  She appears acutely/chronically ill and quite frail.  She is alert and oriented, able to make her needs known.  Her husband, Stephanie [name], is at bedside.  We talked about how she is doing post surgery, and she shares that she feels she is "okay".  We talked about the treatment plan including, but not limited to, NG tube will stay in until she starts having/passing gas, selected labs, physical therapy evaluations for rehab recommendations, UTI and colonization.  We talked about kidney function in detail including but not limited to creatinine trends, normal creatinine, hemodialysis.   Mr. and Stephanie [name] share that they are agreeable to short-term rehab.  They share that she has been to rehab in the past, and would like [facility] if possible/qualified.  Stephanie [name] tells me that his goal is to have Stephanie [name] strong enough to come home.  He shares that as long as she can assist him in getting her to the toilet he feels like he can care for her at home.  Conference with attending, bedside nursing staff, TOC related to patient condition, needs, goals of care.  Plan:   Continue to treat the treatable but no CPR or intubation.  Mr. and Stephanie [name] continue to be open to all treatment modalities as offered.  Agreeable to short-term rehab as qualified, [facility] would be facility of choice.  At this point rehospitalize as needed.  Outpatient palliative to follow.  77 minutes Stephanie Axe, NP Palliative Medicine Team Team Phone [phone] Greater than 50% of this time was spent counseling and coordinating care related to the above assessment and plan.

## 2020-07-01 NOTE — Consult Note (Signed)
PHARMACY - TOTAL PARENTERAL NUTRITION CONSULT NOTE   Indication: Small bowel obstruction  Patient Measurements: Height: 5\' 6"  (167.6 cm) Weight: 86.8 kg (191 lb 6.4 oz) IBW/kg (Calculated) : 59.3 TPN AdjBW (KG): 64.5 Body mass index is 30.89 kg/m. 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  Since admission her renal function worsened and she is now requiring hemodialysis, Currently with with combination of paralytic ileus secondary to electrolyte derangements (improved) and partial SBO given previous abdominal surgeries and CT findings on 09/24  Glucose / Insulin:  BG last 24 hours: 279 - 346, required 40 units SSI A1C 8.0 Electrolytes: hyponatremia, hyperphosphatemia improving, magnesium trending up Renal: worsening since admission s/p 1st HD session 9/28 LFTs / TGs: LFTs wnl -  baseline TG 93 Prealbumin / albumin: albumin  2.7 Intake / Output; MIVF:  1635 in, no output recorded - no MIVF GI Imaging:  9/29 Abd xray - stable small bowel dilation noted concerning for distal SBO 9/30 Abd xray - persistent SBO Surgeries / Procedures:  9/18  right ureteral stent placement 9/30 exploratory laparotomy and lysis of adhesions  Central access: 06/27/20 - CVC triple lumen R internal jugular TPN start date: 06/28/20  Nutritional Goals (per RD recommendation on 9/28): Kcal: 1900-2100 Protein: 95-105 grams Fluid: UOP + 1 L  Current Nutrition:  NPO   Plan:   continue TPN at 61mL/hr  Nutritional components:   amino acids: 64 g/L (99.8g)  dextrose: 19% (296.4g)  lipids 39 g/L (60.8g)  kCal: 2015  Total volume: 1660 mL (minimum amount possible to accomplich nutrition goals)  Electrolytes in TPN: standard electrolytes except  46mmol/L of Phos, Cl:Ac ratio 1:1    Add 4 units insulin levemir BID  Add standard MVI and trace elements to TPN (holding chromium)  Adjust SSI to moderate q4h and adjust as needed   Monitor TPN labs on Mon/Thurs, daily until stable  Dallie Piles, PharmD, BCPS Clinical Pharmacist 07/01/2020 6:57 AM

## 2020-07-01 NOTE — Plan of Care (Signed)
This Probation officer brought H and H to lab and handed to [employer] for processing.

## 2020-07-01 NOTE — Progress Notes (Signed)
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  In the ED, sodium 133, potassium 3.9, chloride 102, bicarb 18, glucose 344, BUN 38, creatinine 1.57, AST 27, ALT 21, troponin 61, lactic acid 3.1, procalcitonin 17, to BC count 20.4, hemoglobin 11.8, platelet 144.  CT angiogram chest negative for PE but with right hydronephrosis which has progressed since October 2020.  Chest x-ray with no acute cardiopulmonary disease process.  TRH consulted for admission secondary to sepsis from UTI/pyelonephritis with subsequent hydronephrosis.   Assessment & Plan:   Principal Problem:   Sepsis ([place]) Active Problems:   Benign essential hypertension   Chronic cystitis   Rheumatoid arthritis involving multiple sites with positive rheumatoid factor (HCC)   Diabetes mellitus (HCC)   Rheumatoid arthritis ([place])   COVID-19 virus infection   AKI (acute kidney injury) ([place])   Atrial fibrillation with rapid ventricular response (HCC)   Essential hypertension   Pure hypercholesterolemia   Acute pyelonephritis   Hydronephrosis, right   Lactic acid acidosis   Hyperglycemia   Hx of essential hypertension   Immunosuppression due to drug therapy ([place])   Pressure injury of skin   Acquired thrombophilia ([place])   On continuous oral anticoagulation   SBO (small bowel obstruction) (HCC)   Goals of care, counseling/discussion   Palliative care by specialist   CKD (chronic kidney disease) stage 5, GFR less than 15 ml/min (HCC)   Ileus (HCC)    Paroxysmal A-fib (HCC)   Sepsis, present on admission Klebsiella pneumonia septicemia, POA Right pyelonephritis Patient presenting with fever, T-max 103.0 with associated tachycardia, tachypnea with a WBC count of 20 K with a left shift and lactic acid of 3.1.  Underwent right ureter stent placement by urology with frank pus noted to kidney.  And blood cultures x2 positive for Klebsiella pneumonia. WBC count improved from 20K to 7.5.  Completed 10-day course of IV antibiotics with followed by ceftriaxone followed by ciprofloxacin.  Blood cultures 06/21/2020 negative. --Continue monitor fever curve, supportive care  Severe right hydronephrosis CT renal notable for progressive right UPJ obstruction in comparison to prior study dated October 2020.  Urology was consulted and right stent placed with findings of frank pus in the kidney by Dr. Jeffie Pollock on 06/18/2020.  Follow-up renal ultrasound 06/20/2020 with resolved hydronephrosis. --Continue Foley catheter per urology, Dr. Jeffie Pollock --Continue strict I's and O's  Acute renal failure Patient presenting above with severe right hydronephrosis with associated sepsis from Klebsiella pneumonia septicemia/pyelonephritis.  Baseline creatinine 0.90 January 2021. Creatinine 1.57 on admission, continue to trend up to a high of 6.33 during admission.  Etiology likely secondary to ATN from severe infection as above. --Nephrology following, appreciate assistance --Underwent placement of temporary HD catheter to right common femoral vein by vascular surgery on 06/27/2020.  --Started on dialysis 9/28; further per nephrology, will assess need daily --Avoid nephrotoxins, renally dose all medications --Closely monitor urinary output --Follow BMP daily  Acute postoperative blood loss anemia Hemoglobin dropped from 8.2 to 7.1 this morning.  Underwent exploratory laparotomy with lysis of adhesions yesterday by general surgery. --Transfuse  1 unit PRBC --Goal maintain  hemoglobin greater than 8.0 given cardiac history --Repeat CBC in a.m.  Hypokalemia Hypomagnesemia Potassium 4.1 this morning.  --Follow electrolytes daily --On TPN  Small bowel obstruction CT abdomen/pelvis with oral contrast notable for high-grade small bowel obstruction with abrupt transition point central lower abdomen, likely secondary to adhesions.  Underwent exploratory laparotomy on 06/30/2020 by Dr. Dahlia Byes with extensive lysis of adhesions. --Continues with NG tube to low intermittent wall suction --Continues on TPN; RIJ PICC placed 9/27 --Continue n.p.o. --Further per general surgery  Paroxysmal atrial fibrillation with RVR Currently in NSR on telemetry --Cardiology following, appreciate assistance --CHA2DS2-VASc score = 5 (HTN, age, T2DM, gender) --IV metoprolol 5 mg every 6 hours --Discontinued heparin drip 2/2 bleeding surrounding HD catheter --continue to monitor on telemetry  GERD: Protonix 40 mg IV every 24 hours  Pressure injury of buttock, stage I, POA --Continue local wound care, offloading  Hx COVID-19 viral infection Patient fully vaccinated, received Pfizer vaccine on 10/16/2019 and 11/07/2019.  Covid-19 PCR positive on 06/09/2020.  No overt respiratory symptoms.  CT chest without acute pulmonary abnormality.  Oxygenating well on room air.  Completed 5-day course of IV remdesivir. --Discontinue airborne/contact isolation since greater than 21 days since initial diagnosis   DVT prophylaxis: SCDs Code Status: DNR Family Communication: Updated patient extensively at bedside  Disposition Plan:  Status is: Inpatient  Remains inpatient appropriate because:Persistent severe electrolyte disturbances, Ongoing active pain requiring inpatient pain management, Ongoing diagnostic testing needed not appropriate for outpatient work up, Unsafe d/c plan, IV treatments appropriate due to intensity of illness or inability to take PO and Inpatient level of care appropriate due to  severity of illness   Dispo: The patient is from: Home              Anticipated d/c is to: SNF              Anticipated d/c date is: > 3 days              Patient currently is not medically stable to d/c.   Consultants:   Cardiology  General surgery  Nephrology  Urology  Palliative care  Procedures:   Right ureteral stent placement, Dr. Jeffie Pollock 9/19  Right femoral HD catheter placement, Dr. Delana Meyer 9/27  Started on HD 9/28  RIJ PICC 9/27  Exploratory laparotomy, Dr Dahlia Byes 06/30/2020  Antimicrobials:   Zosyn 9/19 - 9/23  Ceftriaxone 9/17 - 9/18  Ciprofloxacin 9/23 - 9/26  Azithromycin 9/17 - 9/17  Vancomycin 9/17 - 9/17  Cefepime 9/17 - 9/17    Subjective: Patient seen and examined bedside, resting comfortably.  Underwent exploratory laparotomy with lysis of severe adhesions yesterday by general surgery.  Patient requesting when she can start eating.  No flatus as of yet.  Continues with NG tube to low wall intermittent suction and on TPN.  Creatinine slightly improved today.  Nephrology not planning on further dialysis at this time and will continue to monitor urinary output and creatinine daily.  No other complaints or concerns at this time.  Denies headache, no visual changes, no chest pain, palpitations, no shortness of breath, no fever/chills/night sweats.  No acute events overnight per nursing staff.   Objective: Vitals:   07/01/20 1102 07/01/20 1151 07/01/20 1338 07/01/20 1552  BP: (!) 127/48 (!) 125/47 (!) 137/56 (!) 146/60  Pulse: 86 79 88 90  Resp:  16 20 16   Temp: 98.2 F (36.8 C) 98 F (36.7 C) 98 F (36.7 C)  98.2 F (36.8 C)  TempSrc: Oral Oral Oral Oral  SpO2: 95% 96% 96% 94%  Weight:      Height:        Intake/Output Summary (Last 24 hours) at 07/01/2020 1659 Last data filed at 07/01/2020 1338 Gross per 24 hour  Intake 1453.75 ml  Output 615 ml  Net 838.75 ml   Filed Weights   06/19/20 0625 06/21/20 0125 06/22/20 0453  Weight:  80.4 kg 82.2 kg 86.8 kg    Examination:  General exam: Appears calm and comfortable, chronically ill in appearance Respiratory system: Clear to auscultation. Respiratory effort normal.  Oxygen well on room air with SPO2 95% Cardiovascular system: S1 & S2 heard, RRR. No JVD, murmurs, rubs, gallops or clicks. No pedal edema. Gastrointestinal system: Abdomen is soft, distended, abdominal surgical incision noted with honeycomb in place with small amount of blood present, otherwise, dressing clean/dry/intact. No organomegaly or masses felt.  No bowel sounds appreciated Central nervous system: Alert and oriented. No focal neurological deficits. Extremities: Symmetric 5 x 5 power. Skin: pressure injury stage 1 buttock Psychiatry: Judgement and insight appear normal. Mood & affect appropriate.     Data Reviewed: I have personally reviewed following labs and imaging studies  CBC: Recent Labs  Lab 06/26/20 0510 06/26/20 0510 06/27/20 0545 06/28/20 0530 06/29/20 0645 07/01/20 0526 07/01/20 1518  WBC 6.7  --  5.3 6.2 7.5 7.6  --   NEUTROABS  --   --   --   --  6.4  --   --   HGB 8.9*   < > 9.2* 8.8* 8.2* 7.1* 9.1*  HCT 24.0*   < > 24.6* 24.7* 23.2* 20.8* 25.5*  MCV 72.9*  --  73.2* 76.5* 77.1* 80.9  --   PLT 184  --  157 155 136* 145*  --    < > = values in this interval not displayed.   Basic Metabolic Panel: Recent Labs  Lab 06/28/20 0530 06/28/20 1222 06/29/20 0645 06/29/20 1253 06/29/20 1658 06/30/20 0600 07/01/20 0526  NA 133*  --  135 135  --  134* 133*  132*  K 2.5*   < > 2.5* 3.1* 3.2* 3.7 4.1  4.1  CL 87*  --  92* 93*  --  93* 97*  96*  CO2 27  --  29 27  --  27 27  26   GLUCOSE 131*  --  197* 223*  --  314* 371*  367*  BUN 91*  --  62* 60*  --  62* 74*  74*  CREATININE 6.33*  --  4.94* 4.80*  --  4.77* 4.50*  4.84*  CALCIUM 6.5*  --  7.1* 7.3*  --  7.5* 7.8*  7.7*  MG 1.8  --  1.8  --  2.1 2.1 2.0  PHOS 9.3*  --  6.5*  --   --  5.1* 4.6   < > = values in  this interval not displayed.   GFR: Estimated Creatinine Clearance: 11.6 mL/min (A) (by C-G formula based on SCr of 4.5 mg/dL (H)). Liver Function Tests: Recent Labs  Lab 06/26/20 0510 06/29/20 0645 06/30/20 0600 07/01/20 0526  AST 18 14* 14*  --   ALT 14 13 12   --   ALKPHOS 179* 150* 141*  --   BILITOT 0.9 0.9 0.8  --   PROT 5.0* 4.6* 4.9*  --   ALBUMIN 2.7* 2.1* 2.2* 1.9*   No results for input(s): LIPASE, AMYLASE in the last 168  hours. No results for input(s): AMMONIA in the last 168 hours. Coagulation Profile: Recent Labs  Lab 06/25/20 0731 06/26/20 0510 06/27/20 0545  INR 2.1* 1.8* 1.6*   Cardiac Enzymes: No results for input(s): CKTOTAL, CKMB, CKMBINDEX, TROPONINI in the last 168 hours. BNP (last 3 results) No results for input(s): PROBNP in the last 8760 hours. HbA1C: No results for input(s): HGBA1C in the last 72 hours. CBG: Recent Labs  Lab 06/30/20 1513 06/30/20 1730 06/30/20 2335 07/01/20 0514 07/01/20 1148  GLUCAP 319* 319* 341* 346* 334*   Lipid Profile: Recent Labs    06/29/20 0654  TRIG 93   Thyroid Function Tests: No results for input(s): TSH, T4TOTAL, FREET4, T3FREE, THYROIDAB in the last 72 hours. Anemia Panel: No results for input(s): VITAMINB12, FOLATE, FERRITIN, TIBC, IRON, RETICCTPCT in the last 72 hours. Sepsis Labs: No results for input(s): PROCALCITON, LATICACIDVEN in the last 168 hours.  No results found for this or any previous visit (from the past 240 hour(s)).       Radiology Studies: DG Abd Portable 2V  Result Date: 06/30/2020 CLINICAL DATA:  History of small-bowel obstruction EXAM: X-RAY ABDOMEN 2 VIEWS COMPARISON:  06/29/2020 FINDINGS: Scattered large and small bowel gas is noted. Persistent small bowel dilatation is seen and stable consistent with at least partial small bowel obstruction. Right ureteral stent in nasogastric catheter are noted and stable. Postsurgical changes in the thoracolumbar spine are noted. Right  femoral dialysis catheter is noted. IMPRESSION: Persistent small bowel obstruction.  No free air is seen. Electronically Signed   By: Inez Catalina M.D.   On: 06/30/2020 08:09        Scheduled Meds: . vitamin C  500 mg Oral Daily  . brimonidine  1 drop Both Eyes BID  . chlorhexidine  15 mL Mouth Rinse BID  . Chlorhexidine Gluconate Cloth  6 each Topical Q0600  . epoetin (EPOGEN/PROCRIT) injection  10,000 Units Subcutaneous Weekly  . hydrALAZINE  10 mg Intravenous Q6H  . insulin aspart  0-15 Units Subcutaneous Q4H  . insulin detemir  4 Units Subcutaneous BID  . mouth rinse  15 mL Mouth Rinse BID  . metoprolol tartrate  5 mg Intravenous Q6H  . pantoprazole (PROTONIX) IV  40 mg Intravenous Q24H  . sodium chloride flush  10-40 mL Intracatheter Q12H  . sodium chloride flush  3 mL Intravenous Q12H  . tamsulosin  0.4 mg Oral Daily  . zinc sulfate  220 mg Oral Daily   Continuous Infusions: . sodium chloride 0 mL (06/27/20 1630)  . TPN ADULT (ION) 65 mL/hr at 07/01/20 0236  . TPN ADULT (ION)       LOS: 14 days    Time spent: 38 minutes spent on chart review, discussion with nursing staff, consultants, updating family and interview/physical exam; more than 50% of that time was spent in counseling and/or coordination of care.    Loys Hoselton J [place] DO [facility] Available via Epic secure chat 7am-7pm After these hours, please refer to coverage provider listed on [email or web address] 07/01/2020, 4:59 PM

## 2020-07-01 NOTE — Progress Notes (Signed)
Occupational Therapy Treatment Patient Details Name: Stephanie [name] MRN: [id number] DOB: 10-11-1940 Today's Date: 07/01/2020    History of present illness Stephanie [name] is a 79 y/o F w/ PMH: COVID infection, DM, RA, HTN, remote colon CA, who comes to [facility] on 9/17. Patient found to have klebsiella sepsis and had right sided pyelonephritis. She underwent right ureteral stent placed on on 9/18. Pt with acute on chronic renal failure. Temporary fem cath placed 9/27. Pt also with SBO requiring TPN so R IJ placed 9/27 as well. Pt s/p exploratory lap and lysis of adhesions 9/30 with continue at transfer order in place.   OT comments  Pt seen for OT treatment this date to f/u re: strengthening as it pertains to self care ADLs/ADL mobility. Session limited as pt still w/ R temporary femoral catheter preventing transition to sitting. Pt participates in bed level UB grooming ADLs with MIN A with MIN verbal cues to be mindful of NG tube. In addition, OT guides pt through below-listed bed-level exercise to improve strength as it pertains to preparation to mobilize further and performance of her self care ADLs. Pt tolerates exercise better this date with increased participation, but does require longer rest breaks between sets. Pt left in bed with all needs met and in reach. Continue to anticipate pt will require extensive rehab following hospital stay to improve tolerance and strength as it pertains to ADL performance. SNF continues to be most reasonable d/c recommendation.    Follow Up Recommendations  SNF;Supervision/Assistance - 24 hour    Equipment Recommendations  Other (comment) (defer to next venue of care)    Recommendations for Other Services      Precautions / Restrictions Precautions Precautions: Fall Restrictions Weight Bearing Restrictions: No Other Position/Activity Restrictions: Pt with temporary femcath to R LE, no sitting up. Also w/ R IJ.       Mobility Bed Mobility                General bed mobility comments: not performed as pt with R temporary fem-cath  Transfers                 General transfer comment: Not performed due to R temporary fem-cath    Balance       Sitting balance - Comments: NT       Standing balance comment: NT                           ADL either performed or assessed with clinical judgement   ADL Overall ADL's : Needs assistance/impaired     Grooming: Oral care;Wash/dry face;Minimal assistance;Bed level Grooming Details (indicate cue type and reason): HOB elevated ~50 degrees                               General ADL Comments: unable to progress to sitting ADLs this session as pt still with temporary femoral catheter in place     Vision Patient Visual Report: No change from baseline Additional Comments: reports condition in which she requires eye drops at baseline   Perception     Praxis      Cognition Arousal/Alertness: Awake/alert Behavior During Therapy: WFL for tasks assessed/performed Overall Cognitive Status: Within Functional Limits for tasks assessed  General Comments: some increased processing time, somewhat HOH. Appropriately follows commands/cues. Not oriented to time, with cues, able to discern month.        Exercises Other Exercises Other Exercises: OT facilitates pt participation in bed level UB exercise with very light/gentle use of lateral core to reduce pressure to surgical site. OT engages pt in 1 set x10 reps alternating punches, 1 set x10 reps straight arm raises, 1 set x10 reps alternating contralateral reaching, and 1 set x10 reps hand squeezes with UEs elevated to help with edema mgt. Pt does tolerate better this date but is still grossly weak. Requires MOD verbal/visual cues for form/pace. ~2-3 minute rest breaks between each exercise. Other Exercises: OT facilitates improved positioning of UEs on two pillows each to  assist with edema mgt and notifies RN.   Shoulder Instructions       General Comments      Pertinent Vitals/ Pain       Pain Assessment: Faces Faces Pain Scale: Hurts little more Pain Location: R hand/wrist and abdomen Pain Descriptors / Indicators: Sore;Discomfort Pain Intervention(s): Limited activity within patient's tolerance;Monitored during session  Home Living                                          Prior Functioning/Environment              Frequency  Min 1X/week        Progress Toward Goals  OT Goals(current goals can now be found in the care plan section)  Progress towards OT goals: Progressing toward goals  Acute Rehab OT Goals Patient Stated Goal: go home OT Goal Formulation: With patient Time For Goal Achievement: 07/07/20 Potential to Achieve Goals: [place] Discharge plan remains appropriate    Co-evaluation                 AM-PAC OT "6 Clicks" Daily Activity     Outcome Measure   Help from another person eating meals?: A Little Help from another person taking care of personal grooming?: A Lot Help from another person toileting, which includes using toliet, bedpan, or urinal?: Total Help from another person bathing (including washing, rinsing, drying)?: Total Help from another person to put on and taking off regular upper body clothing?: A Lot Help from another person to put on and taking off regular lower body clothing?: Total 6 Click Score: 10    End of Session    OT Visit Diagnosis: Unsteadiness on feet (R26.81);Muscle weakness (generalized) (M62.81)   Activity Tolerance Patient limited by fatigue   Patient Left in bed;with call bell/phone within reach;with bed alarm set   Nurse Communication Other (comment) (asked MD-Pabon who presented during session about ILWS NG tube drainage as it appears stagnant and he flushed and improved.)        Time: 0922-0953 OT Time Calculation (min): 31 min  Charges:  OT General Charges $OT Visit: 1 Visit OT Treatments $Self Care/Home Management : 8-22 mins $Therapeutic Exercise: 8-22 mins  Gerrianne Scale, MS, OTR/L ascom [phone] 07/01/20, 10:36 AM

## 2020-07-01 NOTE — Anesthesia Postprocedure Evaluation (Signed)
Anesthesia Post Note  Patient: Stephanie [name]  Procedure(s) Performed: EXPLORATORY LAPAROTOMY (N/A )  Patient location during evaluation: PACU Anesthesia Type: General Level of consciousness: awake and alert Pain management: pain level controlled Vital Signs Assessment: post-procedure vital signs reviewed and stable Respiratory status: spontaneous breathing, nonlabored ventilation, respiratory function stable and patient connected to nasal cannula oxygen Cardiovascular status: blood pressure returned to baseline and stable Postop Assessment: no apparent nausea or vomiting Anesthetic complications: no   No complications documented.   Last Vitals:  Vitals:   07/01/20 0317 07/01/20 0727  BP: 137/60 (!) 134/58  Pulse: 79 87  Resp: 18   Temp: 36.5 C 36.9 C  SpO2: 97% 97%    Last Pain:  Vitals:   07/01/20 0727  TempSrc: Oral  PainSc:                  [name]

## 2020-07-01 NOTE — Care Management Important Message (Signed)
Important Message  Patient Details  Name: Stephanie [name] MRN: [id number] Date of Birth: 11-06-1940   Medicare Important Message Given:  Yes     Dannette Barbara 07/01/2020, 12:29 PM

## 2020-07-02 DIAGNOSIS — N10 Acute tubulo-interstitial nephritis: Secondary | ICD-10-CM | POA: Diagnosis not present

## 2020-07-02 DIAGNOSIS — U071 COVID-19: Secondary | ICD-10-CM | POA: Diagnosis not present

## 2020-07-02 DIAGNOSIS — A419 Sepsis, unspecified organism: Secondary | ICD-10-CM | POA: Diagnosis not present

## 2020-07-02 DIAGNOSIS — D6869 Other thrombophilia: Secondary | ICD-10-CM | POA: Diagnosis not present

## 2020-07-02 LAB — RENAL FUNCTION PANEL
Albumin: 2 g/dL — ABNORMAL LOW (ref 3.5–5.0)
Anion gap: 10 (ref 5–15)
BUN: 85 mg/dL — ABNORMAL HIGH (ref 8–23)
CO2: 27 mmol/L (ref 22–32)
Calcium: 8.4 mg/dL — ABNORMAL LOW (ref 8.9–10.3)
Chloride: 98 mmol/L (ref 98–111)
Creatinine, Ser: 4.72 mg/dL — ABNORMAL HIGH (ref 0.44–1.00)
GFR calc Af Amer: 10 mL/min — ABNORMAL LOW (ref >60)
GFR calc non Af Amer: 8 mL/min — ABNORMAL LOW (ref >60)
Glucose, Bld: 298 mg/dL — ABNORMAL HIGH (ref 70–99)
Phosphorus: 3.3 mg/dL (ref 2.5–4.6)
Potassium: 4.3 mmol/L (ref 3.5–5.1)
Sodium: 135 mmol/L (ref 135–145)

## 2020-07-02 LAB — CBC
HCT: 26.7 % — ABNORMAL LOW (ref 36.0–46.0)
Hemoglobin: 8.8 g/dL — ABNORMAL LOW (ref 12.0–15.0)
MCH: 28 pg (ref 26.0–34.0)
MCHC: 33 g/dL (ref 30.0–36.0)
MCV: 85 fL (ref 80.0–100.0)
Platelets: 127 10*3/uL — ABNORMAL LOW (ref 150–400)
RBC: 3.14 MIL/uL — ABNORMAL LOW (ref 3.87–5.11)
RDW: 16.4 % — ABNORMAL HIGH (ref 11.5–15.5)
WBC: 11.7 10*3/uL — ABNORMAL HIGH (ref 4.0–10.5)
nRBC: 0 % (ref 0.0–0.2)

## 2020-07-02 LAB — TYPE AND SCREEN
ABO/RH(D): O POS
Antibody Screen: NEGATIVE
Unit division: 0

## 2020-07-02 LAB — IRON AND TIBC
Iron: 34 ug/dL (ref 28–170)
Saturation Ratios: 18 % (ref 10.4–31.8)
TIBC: 195 ug/dL — ABNORMAL LOW (ref 250–450)
UIBC: 161 ug/dL

## 2020-07-02 LAB — BPAM RBC
Blood Product Expiration Date: 202110122359
ISSUE DATE / TIME: 202110011040
Unit Type and Rh: 5100

## 2020-07-02 LAB — BASIC METABOLIC PANEL
Anion gap: 10 (ref 5–15)
BUN: 86 mg/dL — ABNORMAL HIGH (ref 8–23)
CO2: 27 mmol/L (ref 22–32)
Calcium: 8.5 mg/dL — ABNORMAL LOW (ref 8.9–10.3)
Chloride: 98 mmol/L (ref 98–111)
Creatinine, Ser: 4.79 mg/dL — ABNORMAL HIGH (ref 0.44–1.00)
GFR calc Af Amer: 9 mL/min — ABNORMAL LOW (ref >60)
GFR calc non Af Amer: 8 mL/min — ABNORMAL LOW (ref >60)
Glucose, Bld: 294 mg/dL — ABNORMAL HIGH (ref 70–99)
Potassium: 4.3 mmol/L (ref 3.5–5.1)
Sodium: 135 mmol/L (ref 135–145)

## 2020-07-02 LAB — MAGNESIUM: Magnesium: 2 mg/dL (ref 1.7–2.4)

## 2020-07-02 LAB — GLUCOSE, CAPILLARY
Glucose-Capillary: 278 mg/dL — ABNORMAL HIGH (ref 70–99)
Glucose-Capillary: 271 mg/dL — ABNORMAL HIGH (ref 70–99)
Glucose-Capillary: 304 mg/dL — ABNORMAL HIGH (ref 70–99)
Glucose-Capillary: 297 mg/dL — ABNORMAL HIGH (ref 70–99)
Glucose-Capillary: 267 mg/dL — ABNORMAL HIGH (ref 70–99)
Glucose-Capillary: 224 mg/dL — ABNORMAL HIGH (ref 70–99)

## 2020-07-02 LAB — FERRITIN: Ferritin: 605 ng/mL — ABNORMAL HIGH (ref 11–307)

## 2020-07-02 LAB — TRANSFERRIN: Transferrin: 142 mg/dL — ABNORMAL LOW (ref 192–382)

## 2020-07-02 MED ORDER — INSULIN DETEMIR 100 UNIT/ML ~~LOC~~ SOLN
6.0000 [IU] | Freq: Two times a day (BID) | SUBCUTANEOUS | Status: DC
  Administered 2020-07-02 – 2020-07-03 (×2): 6 [IU] via SUBCUTANEOUS
  Filled 2020-07-02 (×3): qty 0.06

## 2020-07-02 MED ORDER — TRACE MINERALS CU-MN-SE-ZN 300-55-60-3000 MCG/ML IV SOLN
INTRAVENOUS | Status: AC
  Filled 2020-07-02: qty 665.6

## 2020-07-02 NOTE — Progress Notes (Signed)
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  In the ED, sodium 133, potassium 3.9, chloride 102, bicarb 18, glucose 344, BUN 38, creatinine 1.57, AST 27, ALT 21, troponin 61, lactic acid 3.1, procalcitonin 17, to BC count 20.4, hemoglobin 11.8, platelet 144.  CT angiogram chest negative for PE but with right hydronephrosis which has progressed since October 2020.  Chest x-ray with no acute cardiopulmonary disease process.  TRH consulted for admission secondary to sepsis from UTI/pyelonephritis with subsequent hydronephrosis.   Assessment & Plan:   Principal Problem:   Sepsis ([place]) Active Problems:   Benign essential hypertension   Chronic cystitis   Rheumatoid arthritis involving multiple sites with positive rheumatoid factor (HCC)   Diabetes mellitus (HCC)   Rheumatoid arthritis ([place])   COVID-19 virus infection   AKI (acute kidney injury) ([place])   Atrial fibrillation with rapid ventricular response (HCC)   Essential hypertension   Pure hypercholesterolemia   Acute pyelonephritis   Hydronephrosis, right   Lactic acid acidosis   Hyperglycemia   Hx of essential hypertension   Immunosuppression due to drug therapy ([place])   Pressure injury of skin   Acquired thrombophilia ([place])   On continuous oral anticoagulation   SBO (small bowel obstruction) (HCC)   Goals of care, counseling/discussion   Palliative care by specialist   CKD (chronic kidney disease) stage 5, GFR less than 15 ml/min (HCC)   Ileus (HCC)    Paroxysmal A-fib (HCC)   Sepsis, present on admission Klebsiella pneumonia septicemia, POA Right pyelonephritis Patient presenting with fever, T-max 103.0 with associated tachycardia, tachypnea with a WBC count of 20 K with a left shift and lactic acid of 3.1.  Underwent right ureter stent placement by urology with frank pus noted to kidney.  And blood cultures x2 positive for Klebsiella pneumonia. WBC count improved from 20K to 7.5.  Completed 10-day course of IV antibiotics with Zosyn followed by ceftriaxone followed by ciprofloxacin.  Blood cultures 06/21/2020 negative. --Continue monitor fever curve, supportive care  Severe right hydronephrosis CT renal notable for progressive right UPJ obstruction in comparison to prior study dated October 2020.  Urology was consulted and right stent placed with findings of frank pus in the kidney by Dr. Jeffie Pollock on 06/18/2020.  Follow-up renal ultrasound 06/20/2020 with resolved hydronephrosis. --Continue Foley catheter per urology, Dr. Jeffie Pollock --Continue strict I's and O's  Acute renal failure Patient presenting above with severe right hydronephrosis with associated sepsis from Klebsiella pneumonia septicemia/pyelonephritis.  Baseline creatinine 0.90 January 2021. Creatinine 1.57 on admission, continue to trend up to a high of 6.33 during admission.  Etiology likely secondary to ATN from severe infection as above. --Nephrology following, appreciate assistance --Underwent placement of temporary HD catheter to right common femoral vein by vascular surgery on 06/27/2020.  --Started on dialysis 9/28; further per nephrology, will assess need daily --Avoid nephrotoxins, renally dose all medications --Closely monitor urinary output --Follow BMP daily  Acute postoperative blood loss anemia Hemoglobin dropped from 8.2 to 7.1 postoperatively on 07/01/2020.  Transfused 1 unit PRBC on 07/01/2020.  Iron panel with  iron 34, TIBC 195, ferritin 605, consistent with anemia of  chronic disease. --Hgb 8.2>7.1>9.1>8.8 --Goal maintain hemoglobin greater than 8.0 given cardiac history --Repeat CBC in a.m.  Hypokalemia Hypomagnesemia Potassium 4.3 this morning.  --Follow electrolytes daily --On TPN  Small bowel obstruction CT abdomen/pelvis with oral contrast notable for high-grade small bowel obstruction with abrupt transition point central lower abdomen, likely secondary to adhesions.  Underwent exploratory laparotomy on 06/30/2020 by Dr. Dahlia Byes with extensive lysis of adhesions. --Continues with NG tube, general surgery clamping today --Continues on TPN; RIJ PICC placed 9/27 --Continue n.p.o. --Further per general surgery  Paroxysmal atrial fibrillation with RVR Currently in NSR on telemetry --Cardiology following, appreciate assistance --CHA2DS2-VASc score = 5 (HTN, age, T2DM, gender) --IV metoprolol 5 mg every 6 hours --Discontinued heparin drip 2/2 bleeding surrounding HD catheter --continue to monitor on telemetry  GERD: Protonix 40 mg IV every 24 hours  Pressure injury of buttock, stage I, POA --Continue local wound care, offloading  Hx COVID-19 viral infection Patient fully vaccinated, received Pfizer vaccine on 10/16/2019 and 11/07/2019.  Covid-19 PCR positive on 06/09/2020.  No overt respiratory symptoms.  CT chest without acute pulmonary abnormality.  Oxygenating well on room air.  Completed 5-day course of IV remdesivir. --Discontinued airborne/contact isolation since greater than 21 days since initial diagnosis   DVT prophylaxis: SCDs Code Status: DNR Family Communication: Updated patient spouse [name] via telephone this afternoon  Disposition Plan:  Status is: Inpatient  Remains inpatient appropriate because:Persistent severe electrolyte disturbances, Ongoing active pain requiring inpatient pain management, Ongoing diagnostic testing needed not appropriate for outpatient work up, Unsafe d/c plan, IV treatments appropriate due to intensity of  illness or inability to take PO and Inpatient level of care appropriate due to severity of illness   Dispo: The patient is from: Home              Anticipated d/c is to: SNF              Anticipated d/c date is: > 3 days              Patient currently is not medically stable to d/c.   Consultants:   Cardiology  General surgery  Nephrology  Urology  Palliative care  Procedures:   Right ureteral stent placement, Dr. Jeffie Pollock 9/19  Right femoral HD catheter placement, Dr. Delana Meyer 9/27  Started on HD 9/28  RIJ PICC 9/27  Exploratory laparotomy, Dr Dahlia Byes 06/30/2020  Antimicrobials:   Zosyn 9/19 - 9/23  Ceftriaxone 9/17 - 9/18  Ciprofloxacin 9/23 - 9/26  Azithromycin 9/17 - 9/17  Vancomycin 9/17 - 9/17  Cefepime 9/17 - 9/17    Subjective: Patient seen and examined bedside, resting comfortably.  Reports bowel movement overnight.  Continues with NG tube in place, general surgery will clamp today.  Maintain n.p.o. status per general surgery given significant abdominal distention.  Would like to know when she can start having some to eat/drink.  No other complaints or concerns at this time. Denies headache, no visual changes, no chest pain, palpitations, no shortness of breath, no fever/chills/night sweats.  No acute events overnight per nursing staff.   Objective: Vitals:   07/02/20 0045 07/02/20 0305 07/02/20 0907 07/02/20 1111  BP: (!) 155/65 (!) 157/64 (!) 160/79 (!) 135/56  Pulse: 100 98 98 96  Resp: 18 19  16   Temp: 99.1 F (37.3 C) 98 F (36.7 C) 98.8 F (37.1 C) 98.6 F (37 C)  TempSrc: Oral Axillary Oral   SpO2:  95% 94%  95%  Weight:      Height:        Intake/Output Summary (Last 24 hours) at 07/02/2020 1306 Last data filed at 07/02/2020 0935 Gross per 24 hour  Intake 1885.93 ml  Output 2130 ml  Net -244.07 ml   Filed Weights   06/19/20 0625 06/21/20 0125 06/22/20 0453  Weight: 80.4 kg 82.2 kg 86.8 kg    Examination:  General exam: Appears  calm and comfortable, chronically ill in appearance Respiratory system: Clear to auscultation. Respiratory effort normal.  Oxygenating well on room air with SPO2 95% Cardiovascular system: S1 & S2 heard, RRR. No JVD, murmurs, rubs, gallops or clicks. No pedal edema. Gastrointestinal system: Abdomen is soft, distended, abdominal surgical incision noted with honeycomb in place with small amount of blood present, otherwise, dressing clean/dry/intact. No organomegaly or masses felt.  Faint high-pitched bowel sounds noted, NG tube noted in place, now clamped Central nervous system: Alert and oriented. No focal neurological deficits. Extremities: Symmetric 5 x 5 power. Skin: pressure injury stage 1 buttock Psychiatry: Judgement and insight appear normal. Mood & affect appropriate.     Data Reviewed: I have personally reviewed following labs and imaging studies  CBC: Recent Labs  Lab 06/27/20 0545 06/27/20 0545 06/28/20 0530 06/29/20 0645 07/01/20 0526 07/01/20 1518 07/02/20 0513  WBC 5.3  --  6.2 7.5 7.6  --  11.7*  NEUTROABS  --   --   --  6.4  --   --   --   HGB 9.2*   < > 8.8* 8.2* 7.1* 9.1* 8.8*  HCT 24.6*   < > 24.7* 23.2* 20.8* 25.5* 26.7*  MCV 73.2*  --  76.5* 77.1* 80.9  --  85.0  PLT 157  --  155 136* 145*  --  127*   < > = values in this interval not displayed.   Basic Metabolic Panel: Recent Labs  Lab 06/28/20 0530 06/28/20 1222 06/29/20 0645 06/29/20 0645 06/29/20 1253 06/29/20 1658 06/30/20 0600 07/01/20 0526 07/02/20 0513  NA 133*  --  135  --  135  --  134* 133*  132* 135  135  K 2.5*   < > 2.5*   < > 3.1* 3.2* 3.7 4.1  4.1 4.3  4.3  CL 87*  --  92*  --  93*  --  93* 97*  96* 98  98  CO2 27  --  29  --  27  --  27 27  26 27  27   GLUCOSE 131*  --  197*  --  223*  --  314* 371*  367* 294*  298*  BUN 91*  --  62*  --  60*  --  62* 74*  74* 86*  85*  CREATININE 6.33*  --  4.94*  --  4.80*  --  4.77* 4.50*  4.84* 4.79*  4.72*  CALCIUM 6.5*  --  7.1*   --  7.3*  --  7.5* 7.8*  7.7* 8.5*  8.4*  MG 1.8  --  1.8  --   --  2.1 2.1 2.0 2.0  PHOS 9.3*  --  6.5*  --   --   --  5.1* 4.6 3.3   < > = values in this interval not displayed.   GFR: Estimated Creatinine Clearance: 11.1 mL/min (A) (by C-G formula based on SCr of 4.72 mg/dL (H)). Liver Function Tests: Recent Labs  Lab 06/26/20 0510 06/29/20 0645 06/30/20 0600 07/01/20 0526 07/02/20  0513  AST 18 14* 14*  --   --   ALT 14 13 12   --   --   ALKPHOS 179* 150* 141*  --   --   BILITOT 0.9 0.9 0.8  --   --   PROT 5.0* 4.6* 4.9*  --   --   ALBUMIN 2.7* 2.1* 2.2* 1.9* 2.0*   No results for input(s): LIPASE, AMYLASE in the last 168 hours. No results for input(s): AMMONIA in the last 168 hours. Coagulation Profile: Recent Labs  Lab 06/26/20 0510 06/27/20 0545  INR 1.8* 1.6*   Cardiac Enzymes: No results for input(s): CKTOTAL, CKMB, CKMBINDEX, TROPONINI in the last 168 hours. BNP (last 3 results) No results for input(s): PROBNP in the last 8760 hours. HbA1C: No results for input(s): HGBA1C in the last 72 hours. CBG: Recent Labs  Lab 07/01/20 2007 07/01/20 2351 07/02/20 0505 07/02/20 0816 07/02/20 1153  GLUCAP 325* 308* 278* 271* 304*   Lipid Profile: No results for input(s): CHOL, HDL, LDLCALC, TRIG, CHOLHDL, LDLDIRECT in the last 72 hours. Thyroid Function Tests: No results for input(s): TSH, T4TOTAL, FREET4, T3FREE, THYROIDAB in the last 72 hours. Anemia Panel: Recent Labs    07/02/20 0513  FERRITIN 605*  TIBC 195*  IRON 34   Sepsis Labs: No results for input(s): PROCALCITON, LATICACIDVEN in the last 168 hours.  No results found for this or any previous visit (from the past 240 hour(s)).       Radiology Studies: No results found.      Scheduled Meds: . vitamin C  500 mg Oral Daily  . brimonidine  1 drop Both Eyes BID  . chlorhexidine  15 mL Mouth Rinse BID  . Chlorhexidine Gluconate Cloth  6 each Topical Q0600  . epoetin (EPOGEN/PROCRIT)  injection  10,000 Units Subcutaneous Weekly  . hydrALAZINE  10 mg Intravenous Q6H  . insulin aspart  0-15 Units Subcutaneous Q4H  . insulin detemir  4 Units Subcutaneous BID  . mouth rinse  15 mL Mouth Rinse BID  . metoprolol tartrate  5 mg Intravenous Q6H  . pantoprazole (PROTONIX) IV  40 mg Intravenous Q24H  . sodium chloride flush  10-40 mL Intracatheter Q12H  . sodium chloride flush  3 mL Intravenous Q12H  . tamsulosin  0.4 mg Oral Daily  . zinc sulfate  220 mg Oral Daily   Continuous Infusions: . sodium chloride 0 mL (06/27/20 1630)  . TPN ADULT (ION) 65 mL/hr at 07/02/20 0223  . TPN ADULT (ION)       LOS: 15 days    Time spent: 37 minutes spent on chart review, discussion with nursing staff, consultants, updating family and interview/physical exam; more than 50% of that time was spent in counseling and/or coordination of care.    Sonita Michiels J [place] DO [facility] Available via Epic secure chat 7am-7pm After these hours, please refer to coverage provider listed on [email or web address] 07/02/2020, 1:06 PM

## 2020-07-02 NOTE — Progress Notes (Signed)
[facility]  ROUNDING NOTE   Subjective:   Patient still has NG tube in place. Remains n.p.o. other than ice chips. Creatinine still high at 4.8. Urine output 1.8 L over the preceding 24 hours.  10/01 0701 - 10/02 0700 In: 1950.9 [I.V.:1533.9; Blood:342; NG/GT:75] Out: 1990 [Urine:1850; Emesis/NG output:140]   Objective:  Vital signs in last 24 hours:  Temp:  [98 F (36.7 C)-99.1 F (37.3 C)] 98.6 F (37 C) (10/02 1111) Pulse Rate:  [90-100] 96 (10/02 1111) Resp:  [16-19] 16 (10/02 1111) BP: (135-160)/(56-79) 135/56 (10/02 1111) SpO2:  [94 %-95 %] 95 % (10/02 1111)  Weight change:  Filed Weights   06/19/20 0625 06/21/20 0125 06/22/20 0453  Weight: 80.4 kg 82.2 kg 86.8 kg    Intake/Output: I/O last 3 completed shifts: In: 2566.6 [I.V.:2049.6; Blood:342; NG/GT:75; IV Piggyback:100] Out: 1990 [Urine:1850; Emesis/NG output:140]   Intake/Output this shift:  Total I/O In: 10 [I.V.:10] Out: 1200 [Urine:900; Emesis/NG output:300]  Physical Exam: General: Chronically ill appearing  Head: Normocephalic, atraumatic. NGT in place  Lungs:  Normal and symmetrical respirations, clear bilateral  Heart: S1S2, regular  Abdomen:  Mild distension +, tenderness  Extremities: No peripheral edema.  Neurologic: Nonfocal, moving all four extremities  Skin: No new rashes or lesions  Access: Rt fem temp dialysis  catheter placed on 9/37/16    Basic Metabolic Panel: Recent Labs  Lab 06/28/20 0530 06/28/20 1222 06/29/20 0645 06/29/20 0645 06/29/20 1253 06/29/20 1253 06/29/20 1658 06/30/20 0600 07/01/20 0526 07/02/20 0513  NA 133*  --  135  --  135  --   --  134* 133*  132* 135  135  K 2.5*   < > 2.5*   < > 3.1*  --  3.2* 3.7 4.1  4.1 4.3  4.3  CL 87*  --  92*  --  93*  --   --  93* 97*  96* 98  98  CO2 27  --  29  --  27  --   --  27 27  26 27  27   GLUCOSE 131*  --  197*  --  223*  --   --  314* 371*  367* 294*  298*  BUN 91*  --  62*  --  60*  --    --  62* 74*  74* 86*  85*  CREATININE 6.33*  --  4.94*  --  4.80*  --   --  4.77* 4.50*  4.84* 4.79*  4.72*  CALCIUM 6.5*  --  7.1*   < > 7.3*   < >  --  7.5* 7.8*  7.7* 8.5*  8.4*  MG 1.8  --  1.8  --   --   --  2.1 2.1 2.0 2.0  PHOS 9.3*  --  6.5*  --   --   --   --  5.1* 4.6 3.3   < > = values in this interval not displayed.    Liver Function Tests: Recent Labs  Lab 06/26/20 0510 06/29/20 0645 06/30/20 0600 07/01/20 0526 07/02/20 0513  AST 18 14* 14*  --   --   ALT 14 13 12   --   --   ALKPHOS 179* 150* 141*  --   --   BILITOT 0.9 0.9 0.8  --   --   PROT 5.0* 4.6* 4.9*  --   --   ALBUMIN 2.7* 2.1* 2.2* 1.9* 2.0*   No results for input(s): LIPASE, AMYLASE in the  last 168 hours. No results for input(s): AMMONIA in the last 168 hours.  CBC: Recent Labs  Lab 06/27/20 0545 06/27/20 0545 06/28/20 0530 06/29/20 0645 07/01/20 0526 07/01/20 1518 07/02/20 0513  WBC 5.3  --  6.2 7.5 7.6  --  11.7*  NEUTROABS  --   --   --  6.4  --   --   --   HGB 9.2*   < > 8.8* 8.2* 7.1* 9.1* 8.8*  HCT 24.6*   < > 24.7* 23.2* 20.8* 25.5* 26.7*  MCV 73.2*  --  76.5* 77.1* 80.9  --  85.0  PLT 157  --  155 136* 145*  --  127*   < > = values in this interval not displayed.    Cardiac Enzymes: No results for input(s): CKTOTAL, CKMB, CKMBINDEX, TROPONINI in the last 168 hours.  BNP: Invalid input(s): POCBNP  CBG: Recent Labs  Lab 07/01/20 2007 07/01/20 2351 07/02/20 0505 07/02/20 0816 07/02/20 1153  GLUCAP 325* 308* 278* 271* 304*    Microbiology: Results for orders placed or performed during the hospital encounter of 06/17/20  Blood culture (routine x 2)     Status: Abnormal   Collection Time: 06/17/20  3:57 AM   Specimen: BLOOD  Result Value Ref Range Status   Specimen Description   Final    BLOOD LEFT HAND Performed at [facility], [address], [place], [place] [address]    Special Requests   Final    BOTTLES DRAWN AEROBIC AND ANAEROBIC Blood Culture  adequate volume Performed at [facility], [address] [place], [place] [address]    Culture  Setup Time   Final    Organism ID to follow GRAM NEGATIVE RODS IN BOTH AEROBIC AND ANAEROBIC BOTTLES CRITICAL RESULT CALLED TO, READ BACK BY AND VERIFIED WITH: AMY THOMPSON AT 5277 ON 06/17/20 [place] Performed at [facility], [address] [place], [place] [address]    Culture KLEBSIELLA PNEUMONIAE (A)  Final   Report Status 06/19/2020 FINAL  Final   Organism ID, Bacteria KLEBSIELLA PNEUMONIAE  Final      Susceptibility   Klebsiella pneumoniae - MIC*    AMPICILLIN RESISTANT Resistant     CEFAZOLIN <=4 SENSITIVE Sensitive     CEFEPIME <=0.12 SENSITIVE Sensitive     CEFTAZIDIME <=1 SENSITIVE Sensitive     CEFTRIAXONE <=0.25 SENSITIVE Sensitive     CIPROFLOXACIN <=0.25 SENSITIVE Sensitive     GENTAMICIN <=1 SENSITIVE Sensitive     IMIPENEM <=0.25 SENSITIVE Sensitive     TRIMETH/SULFA <=20 SENSITIVE Sensitive     AMPICILLIN/SULBACTAM 4 SENSITIVE Sensitive     PIP/TAZO <=4 SENSITIVE Sensitive     * KLEBSIELLA PNEUMONIAE  Blood culture (routine x 2)     Status: Abnormal   Collection Time: 06/17/20  3:57 AM   Specimen: BLOOD  Result Value Ref Range Status   Specimen Description   Final    BLOOD RIGHT [facility] Performed at [facility], [address], [place], [place] [address]    Special Requests   Final    BOTTLES DRAWN AEROBIC AND ANAEROBIC Blood Culture adequate volume Performed at [facility], [address] [place], [place] [address]    Culture  Setup Time   Final    GRAM NEGATIVE RODS IN BOTH AEROBIC AND ANAEROBIC BOTTLES CRITICAL VALUE NOTED.  VALUE IS CONSISTENT WITH PREVIOUSLY REPORTED AND CALLED VALUE. Performed at [facility], [address] [place],  [address]  Culture (A)  Final    KLEBSIELLA PNEUMONIAE SUSCEPTIBILITIES PERFORMED ON PREVIOUS CULTURE WITHIN THE LAST 5 DAYS. Performed at [facility],  [place] [address], [place], [place] [address]    Report Status 06/19/2020 FINAL  Final  Blood Culture ID Panel (Reflexed)     Status: Abnormal   Collection Time: 06/17/20  3:57 AM  Result Value Ref Range Status   Enterococcus faecalis NOT DETECTED NOT DETECTED Final   Enterococcus Faecium NOT DETECTED NOT DETECTED Final   Listeria monocytogenes NOT DETECTED NOT DETECTED Final   Staphylococcus species NOT DETECTED NOT DETECTED Final   Staphylococcus aureus (BCID) NOT DETECTED NOT DETECTED Final   Staphylococcus epidermidis NOT DETECTED NOT DETECTED Final   Staphylococcus lugdunensis NOT DETECTED NOT DETECTED Final   Streptococcus species NOT DETECTED NOT DETECTED Final   Streptococcus agalactiae NOT DETECTED NOT DETECTED Final   Streptococcus pneumoniae NOT DETECTED NOT DETECTED Final   Streptococcus pyogenes NOT DETECTED NOT DETECTED Final   A.calcoaceticus-baumannii NOT DETECTED NOT DETECTED Final   Bacteroides fragilis NOT DETECTED NOT DETECTED Final   Enterobacterales DETECTED (A) NOT DETECTED Final    Comment: Enterobacterales represent a large order of gram negative bacteria, not a single organism. CRITICAL RESULT CALLED TO, READ BACK BY AND VERIFIED WITH: AMY THOMPSON AT 2585 ON 06/17/20 SNG    Enterobacter cloacae complex NOT DETECTED NOT DETECTED Final   Escherichia coli NOT DETECTED NOT DETECTED Final   Klebsiella aerogenes NOT DETECTED NOT DETECTED Final   Klebsiella oxytoca NOT DETECTED NOT DETECTED Final   Klebsiella pneumoniae DETECTED (A) NOT DETECTED Final    Comment: CRITICAL RESULT CALLED TO, READ BACK BY AND VERIFIED WITH: AMY THOMPSON RN AT 2778 ON 06/17/20 SNG    Proteus species NOT DETECTED NOT DETECTED Final   Salmonella species NOT DETECTED NOT DETECTED Final   Serratia marcescens NOT DETECTED NOT DETECTED Final   Haemophilus influenzae NOT DETECTED NOT DETECTED Final   Neisseria meningitidis NOT DETECTED NOT DETECTED Final   Pseudomonas aeruginosa NOT DETECTED NOT  DETECTED Final   Stenotrophomonas maltophilia NOT DETECTED NOT DETECTED Final   Candida albicans NOT DETECTED NOT DETECTED Final   Candida auris NOT DETECTED NOT DETECTED Final   Candida glabrata NOT DETECTED NOT DETECTED Final   Candida krusei NOT DETECTED NOT DETECTED Final   Candida parapsilosis NOT DETECTED NOT DETECTED Final   Candida tropicalis NOT DETECTED NOT DETECTED Final   Cryptococcus neoformans/gattii NOT DETECTED NOT DETECTED Final   CTX-M ESBL NOT DETECTED NOT DETECTED Final   Carbapenem resistance IMP NOT DETECTED NOT DETECTED Final   Carbapenem resistance KPC NOT DETECTED NOT DETECTED Final   Carbapenem resistance NDM NOT DETECTED NOT DETECTED Final   Carbapenem resist OXA 48 LIKE NOT DETECTED NOT DETECTED Final   Carbapenem resistance VIM NOT DETECTED NOT DETECTED Final    Comment: Performed at [facility], [address], [place], [place] [address]  Urine Culture     Status: Abnormal   Collection Time: 06/17/20 11:01 AM   Specimen: Urine, Random  Result Value Ref Range Status   Specimen Description   Final    URINE, RANDOM Performed at [facility], [address], [place], [place] [address]    Special Requests   Final    NONE Performed at [facility], [address] [place], [place] [address]    Culture >=100,000 COLONIES/mL KLEBSIELLA PNEUMONIAE (A)  Final   Report Status 06/19/2020 FINAL  Final   Organism ID, Bacteria  KLEBSIELLA PNEUMONIAE (A)  Final      Susceptibility   Klebsiella pneumoniae - MIC*    AMPICILLIN RESISTANT Resistant     CEFAZOLIN <=4 SENSITIVE Sensitive     CEFTRIAXONE <=0.25 SENSITIVE Sensitive     CIPROFLOXACIN <=0.25 SENSITIVE Sensitive     GENTAMICIN <=1 SENSITIVE Sensitive     IMIPENEM <=0.25 SENSITIVE Sensitive     NITROFURANTOIN 32 SENSITIVE Sensitive     TRIMETH/SULFA <=20 SENSITIVE Sensitive     AMPICILLIN/SULBACTAM 4 SENSITIVE Sensitive     PIP/TAZO <=4 SENSITIVE Sensitive     * >=100,000  COLONIES/mL KLEBSIELLA PNEUMONIAE  Culture, blood (Routine X 2) w Reflex to ID Panel     Status: None   Collection Time: 06/21/20 11:07 AM   Specimen: BLOOD  Result Value Ref Range Status   Specimen Description BLOOD RIGHT [facility]  Final   Special Requests   Final    BOTTLES DRAWN AEROBIC AND ANAEROBIC Blood Culture adequate volume   Culture   Final    NO GROWTH 5 DAYS Performed at [facility], [address] [place], [place] [address]    Report Status 06/26/2020 FINAL  Final  Culture, blood (Routine X 2) w Reflex to ID Panel     Status: None   Collection Time: 06/21/20 11:09 AM   Specimen: BLOOD  Result Value Ref Range Status   Specimen Description BLOOD [place]  Final   Special Requests   Final    BOTTLES DRAWN AEROBIC AND ANAEROBIC Blood Culture adequate volume   Culture   Final    NO GROWTH 5 DAYS Performed at [facility], [address] [place], [place] [address]    Report Status 06/26/2020 FINAL  Final    Coagulation Studies: No results for input(s): LABPROT, INR in the last 72 hours.  Urinalysis: No results for input(s): COLORURINE, LABSPEC, PHURINE, GLUCOSEU, HGBUR, BILIRUBINUR, KETONESUR, PROTEINUR, UROBILINOGEN, NITRITE, LEUKOCYTESUR in the last 72 hours.  Invalid input(s): APPERANCEUR    Imaging: No results found.   Medications:   . sodium chloride 0 mL (06/27/20 1630)  . TPN ADULT (ION) 65 mL/hr at 07/02/20 0223  . TPN ADULT (ION)     . vitamin C  500 mg Oral Daily  . brimonidine  1 drop Both Eyes BID  . chlorhexidine  15 mL Mouth Rinse BID  . Chlorhexidine Gluconate Cloth  6 each Topical Q0600  . epoetin (EPOGEN/PROCRIT) injection  10,000 Units Subcutaneous Weekly  . hydrALAZINE  10 mg Intravenous Q6H  . insulin aspart  0-15 Units Subcutaneous Q4H  . insulin detemir  4 Units Subcutaneous BID  . mouth rinse  15 mL Mouth Rinse BID  . metoprolol tartrate  5 mg Intravenous Q6H  . pantoprazole (PROTONIX) IV  40 mg Intravenous Q24H   . sodium chloride flush  10-40 mL Intracatheter Q12H  . sodium chloride flush  3 mL Intravenous Q12H  . tamsulosin  0.4 mg Oral Daily  . zinc sulfate  220 mg Oral Daily   sodium chloride, acetaminophen, albuterol, guaiFENesin-dextromethorphan, heparin, morphine injection, ondansetron (ZOFRAN) IV, sodium chloride flush  Assessment/ Plan:  Ms. Stephanie [name] is a 79 y.o.  female admitted to [facility] for COVID pneumonia. Patient found to have klebsiella sepsis and had right sided pyelonephritis. She underwent right ureteral stent placed on on 9/18.  Patient had IV contrast exposure on 9/17.   # AKI secondary to ATN Patient has AKI, likely secondary to sepsis/obstructive uropathy/did have IV contrast exposure/hypotension. BASELINE Cr  0.90 in January 2021 Received dialysis 1st treatment 06/28/2020  Creatinine about the same at 4.8 with urine output of 1.8 L over the preceding 24 hours.  Therefore no immediate need for dialysis at the moment.  Continue to monitor renal parameters daily for now and avoid nephrotoxins as possible.  Lab Results  Component Value Date   CREATININE 4.72 (H) 07/02/2020   CREATININE 4.79 (H) 07/02/2020   CREATININE 4.50 (H) 07/01/2020   CREATININE 4.84 (H) 07/01/2020     # Anemia of renal failure Lab Results  Component Value Date   HGB 8.8 (L) 07/02/2020   Maintain the patient on Epogen 10,000 subcutaneous weekly.  # hyponatremia Sodium now normalized to 135.  We will continue to monitor serum sodium periodically.  # obstructive uropathy with pyelonephritis Patient is status post right ureteral stent placement by Dr. Jeffie Pollock on September 18   # Covid -19  Pneumonia No respiratory symptoms at present Patient completed Remdesivir treatment Off airborne and contact precautions now  #Small bowel obstruction Status post ex lap on September 30 for takedown of adhesions.  Small bowel was completely stuck and fused to the anterior abdominal wall.  Extensive  lysis of adhesions was conducted.  Still has NG tube in place and remains on TPN.       LOS: 15 Stephanie [name] 10/2/20212:05 PM

## 2020-07-02 NOTE — Progress Notes (Signed)
Subjective:  CC: Stephanie [name] is a 79 y.o. female  Hospital stay day 15, 2 Days Post-Op ex lap, lysis of adhesion  HPI: No acute issues overnight  ROS:  General: Denies weight loss, weight gain, fatigue, fevers, chills, and night sweats. Heart: Denies chest pain, palpitations, racing heart, irregular heartbeat, leg pain or swelling, and decreased activity tolerance. Respiratory: Denies breathing difficulty, shortness of breath, wheezing, cough, and sputum. GI: Denies change in appetite, heartburn, nausea, vomiting, constipation, diarrhea, and blood in stool. GU: Denies difficulty urinating, pain with urinating, urgency, frequency, blood in urine.   Objective:   Temp:  [98 F (36.7 C)-99.1 F (37.3 C)] 98.6 F (37 C) (10/02 1111) Pulse Rate:  [79-100] 96 (10/02 1111) Resp:  [16-20] 16 (10/02 1111) BP: (125-160)/(47-79) 135/56 (10/02 1111) SpO2:  [94 %-96 %] 95 % (10/02 1111)     Height: 5\' 6"  (167.6 cm) Weight: 86.8 kg BMI (Calculated): 30.91   Intake/Output this shift:   Intake/Output Summary (Last 24 hours) at 07/02/2020 1129 Last data filed at 07/02/2020 0935 Gross per 24 hour  Intake 1885.93 ml  Output 2130 ml  Net -244.07 ml    Constitutional :  alert, cooperative, appears stated age and no distress  Respiratory:  clear to auscultation bilaterally  Cardiovascular:  regular rate and rhythm  Gastrointestinal: Soft, no guarding, but with tenderness to palpation around incision site as expected.  Moderate amount of distention noted throughout abdomen as well.   Skin: Cool and moist.  Staple line clean dry and intact  Psychiatric: Normal affect, non-agitated, not confused       LABS:  CMP Latest Ref Rng & Units 07/02/2020 07/02/2020 07/01/2020  Glucose 70 - 99 mg/dL 294(H) 298(H) 371(H)  BUN 8 - 23 mg/dL 86(H) 85(H) 74(H)  Creatinine 0.44 - 1.00 mg/dL 4.79(H) 4.72(H) 4.50(H)  Sodium 135 - 145 mmol/L 135 135 133(L)  Potassium 3.5 - 5.1 mmol/L 4.3 4.3 4.1  Chloride 98 -  111 mmol/L 98 98 97(L)  CO2 22 - 32 mmol/L 27 27 27   Calcium 8.9 - 10.3 mg/dL 8.5(L) 8.4(L) 7.8(L)  Total Protein 6.5 - 8.1 g/dL - - -  Total Bilirubin 0.3 - 1.2 mg/dL - - -  Alkaline Phos 38 - 126 U/L - - -  AST 15 - 41 U/L - - -  ALT 0 - 44 U/L - - -   CBC Latest Ref Rng & Units 07/02/2020 07/01/2020 07/01/2020  WBC 4.0 - 10.5 K/uL 11.7(H) - 7.6  Hemoglobin 12.0 - 15.0 g/dL 8.8(L) 9.1(L) 7.1(L)  Hematocrit 36 - 46 % 26.7(L) 25.5(L) 20.8(L)  Platelets 150 - 400 K/uL 127(L) - 145(L)    RADS: n/a Assessment:   79 y.o. female  s/p exploratory laparotomy and lysis of adhesions for partial small bowel obstruction secondary to post-surgical adhesions, complicated by pertinent comorbidities including debilitation, numerousmedicalconditionsnow with ESRD requiring HD.  Bowel movement noted yesterday with NG having minimal thin gastric output.  We will clamp for today to see how she progresses but not remove it until reassessment again tomorrow secondary to her persistent abdominal distention. Npo as well  Further medical management for her comorbidities per hospitalist team

## 2020-07-02 NOTE — Consult Note (Signed)
PHARMACY - TOTAL PARENTERAL NUTRITION CONSULT NOTE   Indication: Small bowel obstruction  Patient Measurements: Height: 5\' 6"  (167.6 cm) Weight: 86.8 kg (191 lb 6.4 oz) IBW/kg (Calculated) : 59.3 TPN AdjBW (KG): 64.5 Body mass index is 30.89 kg/m. 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  Since admission her renal function worsened and she is now requiring hemodialysis, Currently with with combination of paralytic ileus secondary to electrolyte derangements (improved) and partial SBO given previous abdominal surgeries and CT findings on 09/24  Glucose / Insulin:  BG last 24 hours: 271 - 371, required 56 units SSI, Levemir 4 units BID A1C 8.0 Electrolytes: hyponatremia, hyperphosphatemia improving. Phos 4.6>3.3 Renal: worsening since admission s/p 1st HD session 9/28, monitor daily for need for dialysis. Scr 4.79 LFTs / TGs: LFTs wnl -  baseline TG 93 Prealbumin / albumin: albumin  2.7 Intake / Output; MIVF: Urine output 1.8 L over the preceding 24 hours. - no MIVF GI Imaging:  9/29 Abd xray - stable small bowel dilation noted concerning for distal SBO 9/30 Abd xray - persistent SBO Surgeries / Procedures:  9/18  right ureteral stent placement 9/30 exploratory laparotomy and lysis of adhesions  Central access: 06/27/20 - CVC triple lumen R internal jugular TPN start date: 06/28/20  Nutritional Goals (per RD recommendation on 9/28): Kcal: 1900-2100 Protein: 95-105 grams Fluid: UOP + 1 L  Current Nutrition:  NPO   Plan:   continue TPN at 14mL/hr  Nutritional components:   amino acids: 64 g/L (99.8g)  dextrose: 19% (296.4g)  lipids 39 g/L (60.8g)  kCal: 2015  Total volume: 1660 mL (minimum amount possible to  accomplish nutrition goals)  Electrolytes in TPN: standard electrolytes except 31mmol/L of Phos, Cl:Ac ratio 1:1    Will increase to 6 units insulin levemir BID  Add standard MVI and trace elements to TPN (holding chromium)  Adjust SSI to moderate q4h and adjust as needed   Monitor TPN labs on Mon/Thurs, daily until stable  Dionel Archey A, PharmD, BCPS Clinical Pharmacist 07/02/2020 2:09 PM

## 2020-07-03 DIAGNOSIS — A419 Sepsis, unspecified organism: Secondary | ICD-10-CM | POA: Diagnosis not present

## 2020-07-03 DIAGNOSIS — U071 COVID-19: Secondary | ICD-10-CM | POA: Diagnosis not present

## 2020-07-03 DIAGNOSIS — N10 Acute tubulo-interstitial nephritis: Secondary | ICD-10-CM | POA: Diagnosis not present

## 2020-07-03 DIAGNOSIS — D6869 Other thrombophilia: Secondary | ICD-10-CM | POA: Diagnosis not present

## 2020-07-03 LAB — GLUCOSE, CAPILLARY
Glucose-Capillary: 246 mg/dL — ABNORMAL HIGH (ref 70–99)
Glucose-Capillary: 285 mg/dL — ABNORMAL HIGH (ref 70–99)
Glucose-Capillary: 376 mg/dL — ABNORMAL HIGH (ref 70–99)
Glucose-Capillary: 333 mg/dL — ABNORMAL HIGH (ref 70–99)
Glucose-Capillary: 342 mg/dL — ABNORMAL HIGH (ref 70–99)

## 2020-07-03 LAB — CBC
HCT: 25.8 % — ABNORMAL LOW (ref 36.0–46.0)
Hemoglobin: 8.5 g/dL — ABNORMAL LOW (ref 12.0–15.0)
MCH: 28.2 pg (ref 26.0–34.0)
MCHC: 32.9 g/dL (ref 30.0–36.0)
MCV: 85.7 fL (ref 80.0–100.0)
Platelets: 111 10*3/uL — ABNORMAL LOW (ref 150–400)
RBC: 3.01 MIL/uL — ABNORMAL LOW (ref 3.87–5.11)
RDW: 16.8 % — ABNORMAL HIGH (ref 11.5–15.5)
WBC: 11.3 10*3/uL — ABNORMAL HIGH (ref 4.0–10.5)
nRBC: 0 % (ref 0.0–0.2)

## 2020-07-03 LAB — BASIC METABOLIC PANEL
Anion gap: 6 (ref 5–15)
BUN: 91 mg/dL — ABNORMAL HIGH (ref 8–23)
CO2: 31 mmol/L (ref 22–32)
Calcium: 8.3 mg/dL — ABNORMAL LOW (ref 8.9–10.3)
Chloride: 101 mmol/L (ref 98–111)
Creatinine, Ser: 4.38 mg/dL — ABNORMAL HIGH (ref 0.44–1.00)
GFR calc Af Amer: 11 mL/min — ABNORMAL LOW (ref >60)
GFR calc non Af Amer: 9 mL/min — ABNORMAL LOW (ref >60)
Glucose, Bld: 257 mg/dL — ABNORMAL HIGH (ref 70–99)
Potassium: 4.5 mmol/L (ref 3.5–5.1)
Sodium: 138 mmol/L (ref 135–145)

## 2020-07-03 LAB — MAGNESIUM: Magnesium: 1.9 mg/dL (ref 1.7–2.4)

## 2020-07-03 MED ORDER — TRACE MINERALS CU-MN-SE-ZN 300-55-60-3000 MCG/ML IV SOLN
INTRAVENOUS | Status: AC
  Filled 2020-07-03: qty 665.6

## 2020-07-03 MED ORDER — LABETALOL HCL 5 MG/ML IV SOLN
10.0000 mg | Freq: Four times a day (QID) | INTRAVENOUS | Status: DC | PRN
  Administered 2020-07-03 – 2020-07-08 (×2): 10 mg via INTRAVENOUS
  Filled 2020-07-03 (×2): qty 4

## 2020-07-03 MED ORDER — INSULIN DETEMIR 100 UNIT/ML ~~LOC~~ SOLN
4.0000 [IU] | Freq: Once | SUBCUTANEOUS | Status: AC
  Administered 2020-07-03: 4 [IU] via SUBCUTANEOUS
  Filled 2020-07-03: qty 0.04

## 2020-07-03 MED ORDER — INSULIN DETEMIR 100 UNIT/ML ~~LOC~~ SOLN
8.0000 [IU] | Freq: Two times a day (BID) | SUBCUTANEOUS | Status: DC
  Administered 2020-07-03 – 2020-07-06 (×6): 8 [IU] via SUBCUTANEOUS
  Filled 2020-07-03 (×8): qty 0.08

## 2020-07-03 NOTE — TOC Progression Note (Signed)
Transition of Care [facility]) - Progression Note    Patient Details  Name: Stephanie [name] MRN: [id number] Date of Birth: 07-05-1941  Transition of Care [facility]) CM/SW [place], LCSW Phone Number: 07/03/2020, 10:05 AM  Clinical Narrative:    CSW called Tammy with [facility] to update her, they will accept her but she is not medically ready for discharge, possibly on Monday.   Expected Discharge Plan: [place] Barriers to Discharge: Continued Medical Work up  Expected Discharge Plan and Services Expected Discharge Plan: [place] In-house Referral: NA   Post Acute Care Choice: NA Living arrangements for the past 2 months: Single Family Home                                       Social Determinants of Health (SDOH) Interventions    Readmission Risk Interventions Readmission Risk Prevention Plan 06/21/2020 06/19/2020  Transportation Screening Complete Complete  PCP or Specialist Appt within 3-5 Days Complete -  HRI or Home Care Consult Complete -  Social Work Consult for [place] Planning/Counseling Complete -  Palliative Care Screening Not Applicable -  Medication Review Press [employer]) (No Data) Complete  PCP or Specialist appointment within 3-5 days of discharge - Complete  HRI or [place] - Complete  SW Recovery Care/Counseling Consult - Complete  [place] - Not Applicable  Some recent data might be hidden

## 2020-07-03 NOTE — Progress Notes (Signed)
Subjective:  CC: MEKHIA BROGAN is a 79 y.o. female  Hospital stay day 16, 3 Days Post-Op ex lap, lysis of adhesion  HPI: No acute issues overnight  ROS:  General: Denies weight loss, weight gain, fatigue, fevers, chills, and night sweats. Heart: Denies chest pain, palpitations, racing heart, irregular heartbeat, leg pain or swelling, and decreased activity tolerance. Respiratory: Denies breathing difficulty, shortness of breath, wheezing, cough, and sputum. GI: Denies change in appetite, heartburn, nausea, vomiting, constipation, diarrhea, and blood in stool. GU: Denies difficulty urinating, pain with urinating, urgency, frequency, blood in urine.   Objective:   Temp:  [97.7 F (36.5 C)-98.7 F (37.1 C)] 97.7 F (36.5 C) (10/03 1217) Pulse Rate:  [95-101] 101 (10/03 1217) Resp:  [16-19] 16 (10/03 1217) BP: (141-183)/(60-90) 183/90 (10/03 1217) SpO2:  [94 %-97 %] 96 % (10/03 1217)     Height: 5\' 6"  (167.6 cm) Weight: 86.8 kg BMI (Calculated): 30.91   Intake/Output this shift:   Intake/Output Summary (Last 24 hours) at 07/03/2020 1405 Last data filed at 07/03/2020 0403 Gross per 24 hour  Intake 634.55 ml  Output 1100 ml  Net -465.45 ml    Constitutional :  alert, cooperative, appears stated age and no distress  Respiratory:  clear to auscultation bilaterally  Cardiovascular:  regular rate and rhythm  Gastrointestinal: Soft, no guarding, but with tenderness to palpation around incision site as expected. Improved distention compared to previous exam..   Skin: Cool and moist.  Staple line clean dry and intact  Psychiatric: Normal affect, non-agitated, not confused       LABS:  CMP Latest Ref Rng & Units 07/03/2020 07/02/2020 07/02/2020  Glucose 70 - 99 mg/dL 257(H) 294(H) 298(H)  BUN 8 - 23 mg/dL 91(H) 86(H) 85(H)  Creatinine 0.44 - 1.00 mg/dL 4.38(H) 4.79(H) 4.72(H)  Sodium 135 - 145 mmol/L 138 135 135  Potassium 3.5 - 5.1 mmol/L 4.5 4.3 4.3  Chloride 98 - 111 mmol/L 101 98  98  CO2 22 - 32 mmol/L 31 27 27   Calcium 8.9 - 10.3 mg/dL 8.3(L) 8.5(L) 8.4(L)  Total Protein 6.5 - 8.1 g/dL - - -  Total Bilirubin 0.3 - 1.2 mg/dL - - -  Alkaline Phos 38 - 126 U/L - - -  AST 15 - 41 U/L - - -  ALT 0 - 44 U/L - - -   CBC Latest Ref Rng & Units 07/03/2020 07/02/2020 07/01/2020  WBC 4.0 - 10.5 K/uL 11.3(H) 11.7(H) -  Hemoglobin 12.0 - 15.0 g/dL 8.5(L) 8.8(L) 9.1(L)  Hematocrit 36 - 46 % 25.8(L) 26.7(L) 25.5(L)  Platelets 150 - 400 K/uL 111(L) 127(L) -    RADS: n/a Assessment:   79 y.o. female  s/p exploratory laparotomy and lysis of adhesions for partial small bowel obstruction secondary to post-surgical adhesions, complicated by pertinent comorbidities including debilitation, numerousmedicalconditionsnow with ESRD requiring HD.  Additional bowel movements noted along with improved distention today so NG tube was removed. Clear liquid diet restarted. Advance diet as tolerated. Start weaning off TPN likely tomorrow.  Further medical management for her comorbidities per hospitalist team

## 2020-07-03 NOTE — Progress Notes (Signed)
[facility]  ROUNDING NOTE   Subjective:   Patient still with mild abdominal distention. Still has NG tube in place. Overall renal function remains low with a creatinine of 4.3 and EGFR of 9. However urine output was 2 L over the preceding 24 hours.  10/02 0701 - 10/03 0700 In: 644.6 [I.V.:644.6] Out: 2300 [Urine:2000; Emesis/NG output:300]   Objective:  Vital signs in last 24 hours:  Temp:  [97.7 F (36.5 C)-98.7 F (37.1 C)] 97.7 F (36.5 C) (10/03 1217) Pulse Rate:  [95-101] 101 (10/03 1217) Resp:  [16-19] 16 (10/03 1217) BP: (141-183)/(60-90) 183/90 (10/03 1217) SpO2:  [94 %-97 %] 96 % (10/03 1217)  Weight change:  Filed Weights   06/19/20 0625 06/21/20 0125 06/22/20 0453  Weight: 80.4 kg 82.2 kg 86.8 kg    Intake/Output: I/O last 3 completed shifts: In: 1197.3 [I.V.:1197.3] Out: 2950 [Urine:2600; Emesis/NG output:350]   Intake/Output this shift:  No intake/output data recorded.  Physical Exam: General: Chronically ill appearing  Head: Normocephalic, atraumatic. NGT in place  Lungs:  Normal and symmetrical respirations, clear bilateral  Heart: S1S2, regular  Abdomen:  Mild distension +, tenderness  Extremities: No peripheral edema.  Neurologic: Nonfocal, moving all four extremities  Skin: No new rashes or lesions  Access: Rt fem temp dialysis  catheter placed on 5/42/70    Basic Metabolic Panel: Recent Labs  Lab 06/28/20 0530 06/28/20 1222 06/29/20 0645 06/29/20 0645 06/29/20 1253 06/29/20 1253 06/29/20 1658 06/30/20 0600 06/30/20 0600 07/01/20 0526 07/02/20 0513 07/03/20 0430  NA 133*  --  135   < > 135  --   --  134*  --  133*  132* 135  135 138  K 2.5*   < > 2.5*   < > 3.1*  --  3.2* 3.7  --  4.1  4.1 4.3  4.3 4.5  CL 87*  --  92*   < > 93*  --   --  93*  --  97*  96* 98  98 101  CO2 27  --  29   < > 27  --   --  27  --  $R'27  26 27  27 31  'SR$ GLUCOSE 131*  --  197*   < > 223*  --   --  314*  --  371*  367* 294*  298*  257*  BUN 91*  --  62*   < > 60*  --   --  62*  --  74*  74* 86*  85* 91*  CREATININE 6.33*  --  4.94*   < > 4.80*  --   --  4.77*  --  4.50*  4.84* 4.79*  4.72* 4.38*  CALCIUM 6.5*  --  7.1*   < > 7.3*   < >  --  7.5*   < > 7.8*  7.7* 8.5*  8.4* 8.3*  MG 1.8  --  1.8   < >  --   --  2.1 2.1  --  2.0 2.0 1.9  PHOS 9.3*  --  6.5*  --   --   --   --  5.1*  --  4.6 3.3  --    < > = values in this interval not displayed.    Liver Function Tests: Recent Labs  Lab 06/29/20 0645 06/30/20 0600 07/01/20 0526 07/02/20 0513  AST 14* 14*  --   --   ALT 13 12  --   --   [facility]  150* 141*  --   --   BILITOT 0.9 0.8  --   --   PROT 4.6* 4.9*  --   --   ALBUMIN 2.1* 2.2* 1.9* 2.0*   No results for input(s): LIPASE, AMYLASE in the last 168 hours. No results for input(s): AMMONIA in the last 168 hours.  CBC: Recent Labs  Lab 06/28/20 0530 06/28/20 0530 06/29/20 0645 07/01/20 0526 07/01/20 1518 07/02/20 0513 07/03/20 0430  WBC 6.2  --  7.5 7.6  --  11.7* 11.3*  NEUTROABS  --   --  6.4  --   --   --   --   HGB 8.8*   < > 8.2* 7.1* 9.1* 8.8* 8.5*  HCT 24.7*   < > 23.2* 20.8* 25.5* 26.7* 25.8*  MCV 76.5*  --  77.1* 80.9  --  85.0 85.7  PLT 155  --  136* 145*  --  127* 111*   < > = values in this interval not displayed.    Cardiac Enzymes: No results for input(s): CKTOTAL, CKMB, CKMBINDEX, TROPONINI in the last 168 hours.  BNP: Invalid input(s): POCBNP  CBG: Recent Labs  Lab 07/02/20 1934 07/02/20 2317 07/03/20 0433 07/03/20 0749 07/03/20 1214  GLUCAP 267* 224* 246* 285* 376*    Microbiology: Results for orders placed or performed during the hospital encounter of 06/17/20  Blood culture (routine x 2)     Status: Abnormal   Collection Time: 06/17/20  3:57 AM   Specimen: BLOOD  Result Value Ref Range Status   Specimen Description   Final    BLOOD LEFT HAND Performed at [facility] [address] [place], [place] [address]    Special Requests   Final     BOTTLES DRAWN AEROBIC AND ANAEROBIC Blood Culture adequate volume Performed at [facility], [address] [place], [place] [address]    Culture  Setup Time   Final    Organism ID to follow GRAM NEGATIVE RODS IN BOTH AEROBIC AND ANAEROBIC BOTTLES CRITICAL RESULT CALLED TO, READ BACK BY AND VERIFIED WITH: AMY THOMPSON AT 1497 ON 06/17/20 Cal[place] Performed at [facility], [address] [place], [place] [address]    Culture KLEBSIELLA PNEUMONIAE (A)  Final   Report Status 06/19/2020 FINAL  Final   Organism ID, Bacteria KLEBSIELLA PNEUMONIAE  Final      Susceptibility   Klebsiella pneumoniae - MIC*    AMPICILLIN RESISTANT Resistant     CEFAZOLIN <=4 SENSITIVE Sensitive     CEFEPIME <=0.12 SENSITIVE Sensitive     CEFTAZIDIME <=1 SENSITIVE Sensitive     CEFTRIAXONE <=0.25 SENSITIVE Sensitive     CIPROFLOXACIN <=0.25 SENSITIVE Sensitive     GENTAMICIN <=1 SENSITIVE Sensitive     IMIPENEM <=0.25 SENSITIVE Sensitive     TRIMETH/SULFA <=20 SENSITIVE Sensitive     AMPICILLIN/SULBACTAM 4 SENSITIVE Sensitive     PIP/TAZO <=4 SENSITIVE Sensitive     * KLEBSIELLA PNEUMONIAE  Blood culture (routine x 2)     Status: Abnormal   Collection Time: 06/17/20  3:57 AM   Specimen: BLOOD  Result Value Ref Range Status   Specimen Description   Final    BLOOD RIGHT [facility] Performed at [facility], [address], [place], [place] [address]    Special Requests   Final    BOTTLES DRAWN AEROBIC AND ANAEROBIC Blood Culture adequate volume Performed at [facility], [address], [place], [place] [address]    Culture  Setup Time   Final    GRAM NEGATIVE RODS IN BOTH AEROBIC AND ANAEROBIC BOTTLES CRITICAL VALUE NOTED.  VALUE IS CONSISTENT WITH PREVIOUSLY REPORTED AND CALLED VALUE. Performed at [facility], [address] [place], [place] [address]    Culture (A)  Final    KLEBSIELLA PNEUMONIAE SUSCEPTIBILITIES PERFORMED ON PREVIOUS CULTURE WITHIN  THE LAST 5 DAYS. Performed at [facility], [place] [address], [place], [place] [address]    Report Status 06/19/2020 FINAL  Final  Blood Culture ID Panel (Reflexed)     Status: Abnormal   Collection Time: 06/17/20  3:57 AM  Result Value Ref Range Status   Enterococcus faecalis NOT DETECTED NOT DETECTED Final   Enterococcus Faecium NOT DETECTED NOT DETECTED Final   Listeria monocytogenes NOT DETECTED NOT DETECTED Final   Staphylococcus species NOT DETECTED NOT DETECTED Final   Staphylococcus aureus (BCID) NOT DETECTED NOT DETECTED Final   Staphylococcus epidermidis NOT DETECTED NOT DETECTED Final   Staphylococcus lugdunensis NOT DETECTED NOT DETECTED Final   Streptococcus species NOT DETECTED NOT DETECTED Final   Streptococcus agalactiae NOT DETECTED NOT DETECTED Final   Streptococcus pneumoniae NOT DETECTED NOT DETECTED Final   Streptococcus pyogenes NOT DETECTED NOT DETECTED Final   A.calcoaceticus-baumannii NOT DETECTED NOT DETECTED Final   Bacteroides fragilis NOT DETECTED NOT DETECTED Final   Enterobacterales DETECTED (A) NOT DETECTED Final    Comment: Enterobacterales represent a large order of gram negative bacteria, not a single organism. CRITICAL RESULT CALLED TO, READ BACK BY AND VERIFIED WITH: AMY THOMPSON AT 0962 ON 06/17/20 SNG    Enterobacter cloacae complex NOT DETECTED NOT DETECTED Final   Escherichia coli NOT DETECTED NOT DETECTED Final   Klebsiella aerogenes NOT DETECTED NOT DETECTED Final   Klebsiella oxytoca NOT DETECTED NOT DETECTED Final   Klebsiella pneumoniae DETECTED (A) NOT DETECTED Final    Comment: CRITICAL RESULT CALLED TO, READ BACK BY AND VERIFIED WITH: AMY THOMPSON RN AT 8366 ON 06/17/20 SNG    Proteus species NOT DETECTED NOT DETECTED Final   Salmonella species NOT DETECTED NOT DETECTED Final   Serratia marcescens NOT DETECTED NOT DETECTED Final   Haemophilus influenzae NOT DETECTED NOT DETECTED Final   Neisseria meningitidis NOT DETECTED NOT  DETECTED Final   Pseudomonas aeruginosa NOT DETECTED NOT DETECTED Final   Stenotrophomonas maltophilia NOT DETECTED NOT DETECTED Final   Candida albicans NOT DETECTED NOT DETECTED Final   Candida auris NOT DETECTED NOT DETECTED Final   Candida glabrata NOT DETECTED NOT DETECTED Final   Candida krusei NOT DETECTED NOT DETECTED Final   Candida parapsilosis NOT DETECTED NOT DETECTED Final   Candida tropicalis NOT DETECTED NOT DETECTED Final   Cryptococcus neoformans/gattii NOT DETECTED NOT DETECTED Final   CTX-M ESBL NOT DETECTED NOT DETECTED Final   Carbapenem resistance IMP NOT DETECTED NOT DETECTED Final   Carbapenem resistance KPC NOT DETECTED NOT DETECTED Final   Carbapenem resistance NDM NOT DETECTED NOT DETECTED Final   Carbapenem resist OXA 48 LIKE NOT DETECTED NOT DETECTED Final   Carbapenem resistance VIM NOT DETECTED NOT DETECTED Final    Comment: Performed at [facility], [address], [place], [place] [address]  Urine Culture     Status: Abnormal   Collection Time: 06/17/20 11:01 AM   Specimen: Urine, Random  Result Value Ref Range Status   Specimen Description   Final    URINE, RANDOM Performed at [facility], [address], [place], [place] [address]  Special Requests   Final    NONE Performed at [facility], [name] [place], [place] [address]    Culture >=100,000 COLONIES/mL KLEBSIELLA PNEUMONIAE (A)  Final   Report Status 06/19/2020 FINAL  Final   Organism ID, Bacteria KLEBSIELLA PNEUMONIAE (A)  Final      Susceptibility   Klebsiella pneumoniae - MIC*    AMPICILLIN RESISTANT Resistant     CEFAZOLIN <=4 SENSITIVE Sensitive     CEFTRIAXONE <=0.25 SENSITIVE Sensitive     CIPROFLOXACIN <=0.25 SENSITIVE Sensitive     GENTAMICIN <=1 SENSITIVE Sensitive     IMIPENEM <=0.25 SENSITIVE Sensitive     NITROFURANTOIN 32 SENSITIVE Sensitive     TRIMETH/SULFA <=20 SENSITIVE Sensitive     AMPICILLIN/SULBACTAM 4 SENSITIVE Sensitive      PIP/TAZO <=4 SENSITIVE Sensitive     * >=100,000 COLONIES/mL KLEBSIELLA PNEUMONIAE  Culture, blood (Routine X 2) w Reflex to ID Panel     Status: None   Collection Time: 06/21/20 11:07 AM   Specimen: BLOOD  Result Value Ref Range Status   Specimen Description BLOOD RIGHT [facility]  Final   Special Requests   Final    BOTTLES DRAWN AEROBIC AND ANAEROBIC Blood Culture adequate volume   Culture   Final    NO GROWTH 5 DAYS Performed at [facility], [address] [place], [place] [address]    Report Status 06/26/2020 FINAL  Final  Culture, blood (Routine X 2) w Reflex to ID Panel     Status: None   Collection Time: 06/21/20 11:09 AM   Specimen: BLOOD  Result Value Ref Range Status   Specimen Description BLOOD LEFTHAND  Final   Special Requests   Final    BOTTLES DRAWN AEROBIC AND ANAEROBIC Blood Culture adequate volume   Culture   Final    NO GROWTH 5 DAYS Performed at [facility], [address] [place], [place] [address]    Report Status 06/26/2020 FINAL  Final    Coagulation Studies: No results for input(s): LABPROT, INR in the last 72 hours.  Urinalysis: No results for input(s): COLORURINE, LABSPEC, PHURINE, GLUCOSEU, HGBUR, BILIRUBINUR, KETONESUR, PROTEINUR, UROBILINOGEN, NITRITE, LEUKOCYTESUR in the last 72 hours.  Invalid input(s): APPERANCEUR    Imaging: No results found.   Medications:   . sodium chloride 0 mL (06/27/20 1630)  . TPN ADULT (ION) 65 mL/hr at 07/02/20 1810  . TPN ADULT (ION)     . vitamin C  500 mg Oral Daily  . brimonidine  1 drop Both Eyes BID  . chlorhexidine  15 mL Mouth Rinse BID  . Chlorhexidine Gluconate Cloth  6 each Topical Q0600  . epoetin (EPOGEN/PROCRIT) injection  10,000 Units Subcutaneous Weekly  . insulin aspart  0-15 Units Subcutaneous Q4H  . insulin detemir  8 Units Subcutaneous BID  . mouth rinse  15 mL Mouth Rinse BID  . metoprolol tartrate  5 mg Intravenous Q6H  . pantoprazole (PROTONIX) IV  40 mg  Intravenous Q24H  . sodium chloride flush  10-40 mL Intracatheter Q12H  . sodium chloride flush  3 mL Intravenous Q12H  . tamsulosin  0.4 mg Oral Daily  . zinc sulfate  220 mg Oral Daily   sodium chloride, acetaminophen, albuterol, guaiFENesin-dextromethorphan, heparin, labetalol, morphine injection, ondansetron (ZOFRAN) IV, sodium chloride flush  Assessment/ Plan:  Ms. Stephanie [name] is a 79 y.o.  female admitted to [facility] for COVID pneumonia. Patient found to have klebsiella sepsis and had right sided pyelonephritis.  She underwent right ureteral stent placed on on 9/18.  Patient had IV contrast exposure on 9/17.   # AKI secondary to ATN Patient has AKI, likely secondary to sepsis/obstructive uropathy/did have IV contrast exposure/hypotension. BASELINE Cr 0.90 in January 2021 Received dialysis 1st treatment 06/28/2020  BUN up to 91 with a creatinine of 4.38.  Azotemia may be due to TPN.  May need to consider dialysis treatment early next week to treat any potential underlying uremia.  Lab Results  Component Value Date   CREATININE 4.38 (H) 07/03/2020   CREATININE 4.72 (H) 07/02/2020   CREATININE 4.79 (H) 07/02/2020     # Anemia of renal failure Lab Results  Component Value Date   HGB 8.5 (L) 07/03/2020   Maintain the patient on Epogen 10,000 subcutaneous weekly.  # hyponatremia Sodium up to 138.  Continue to monitor.  # obstructive uropathy with pyelonephritis Patient is status post right ureteral stent placement by Dr. Jeffie Pollock on September 18   # Covid -19  Pneumonia In the post recovery period.  Breathing comfortably.  #Small bowel obstruction Status post ex lap on September 30 for takedown of adhesions.  Small bowel was completely stuck and fused to the anterior abdominal wall.  Extensive lysis of adhesions was conducted.  Still has NG tube in place and remains on TPN.       LOS: 16 Garland Smouse 10/3/20213:07 PM

## 2020-07-03 NOTE — Progress Notes (Signed)
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  In the ED, sodium 133, potassium 3.9, chloride 102, bicarb 18, glucose 344, BUN 38, creatinine 1.57, AST 27, ALT 21, troponin 61, lactic acid 3.1, procalcitonin 17, to BC count 20.4, hemoglobin 11.8, platelet 144.  CT angiogram chest negative for PE but with right hydronephrosis which has progressed since October 2020.  Chest x-ray with no acute cardiopulmonary disease process.  TRH consulted for admission secondary to sepsis from UTI/pyelonephritis with subsequent hydronephrosis.   Assessment & Plan:   Principal Problem:   Sepsis ([place]) Active Problems:   Benign essential hypertension   Chronic cystitis   Rheumatoid arthritis involving multiple sites with positive rheumatoid factor (HCC)   Diabetes mellitus (HCC)   Rheumatoid arthritis ([place])   COVID-19 virus infection   AKI (acute kidney injury) ([place])   Atrial fibrillation with rapid ventricular response (HCC)   Essential hypertension   Pure hypercholesterolemia   Acute pyelonephritis   Hydronephrosis, right   Lactic acid acidosis   Hyperglycemia   Hx of essential hypertension   Immunosuppression due to drug therapy ([place])   Pressure injury of skin   Acquired thrombophilia ([place])   On continuous oral anticoagulation   SBO (small bowel obstruction) (HCC)   Goals of care, counseling/discussion   Palliative care by specialist   CKD (chronic kidney disease) stage 5, GFR less than 15 ml/min (HCC)   Ileus (HCC)    Paroxysmal A-fib (HCC)   Sepsis, present on admission Klebsiella pneumonia septicemia, POA Right pyelonephritis Patient presenting with fever, T-max 103.0 with associated tachycardia, tachypnea with a WBC count of 20 K with a left shift and lactic acid of 3.1.  Underwent right ureter stent placement by urology with frank pus noted to kidney.  And blood cultures x2 positive for Klebsiella pneumonia. WBC count improved from 20K to 7.5.  Completed 10-day course of IV antibiotics with Zosyn followed by ceftriaxone followed by ciprofloxacin.  Blood cultures 06/21/2020 negative. --Continue monitor fever curve, supportive care  Severe right hydronephrosis CT renal notable for progressive right UPJ obstruction in comparison to prior study dated October 2020.  Urology was consulted and right stent placed with findings of frank pus in the kidney by Dr. Jeffie Pollock on 06/18/2020.  Follow-up renal ultrasound 06/20/2020 with resolved hydronephrosis. --Continue Foley catheter per urology, Dr. Jeffie Pollock --Continue strict I's and O's  Acute renal failure Patient presenting above with severe right hydronephrosis with associated sepsis from Klebsiella pneumonia septicemia/pyelonephritis.  Baseline creatinine 0.90 January 2021. Creatinine 1.57 on admission, trended up to a high of 6.33 during admission.  Etiology likely secondary to ATN from severe infection as above. --Nephrology following, appreciate assistance --Underwent placement of temporary HD catheter to right common femoral vein by vascular surgery on 06/27/2020.  --Started on dialysis 9/28; further per nephrology, will assess need daily --Avoid nephrotoxins, renally dose all medications --Closely monitor urinary output --Follow BMP daily  Acute postoperative blood loss anemia Hemoglobin dropped from 8.2 to 7.1 postoperatively on 07/01/2020.  Transfused 1 unit PRBC on 07/01/2020.  Iron panel with iron 34,  TIBC 195, ferritin 605, consistent with anemia of chronic  disease. --Hgb 8.2>7.1>9.1>8.8>8.5 --Goal maintain hemoglobin greater than 8.0 given cardiac history --Repeat CBC in a.m.  Hypokalemia Hypomagnesemia Potassium 4.5 this morning.  --Follow electrolytes daily --On TPN  Small bowel obstruction CT abdomen/pelvis with oral contrast notable for high-grade small bowel obstruction with abrupt transition point central lower abdomen, likely secondary to adhesions.  Underwent exploratory laparotomy on 06/30/2020 by Dr. Dahlia Byes with extensive lysis of adhesions. --Continues with NG tube, clamped on 10/2 --Continues on TPN; RIJ PICC placed 9/27 --Diet advanced to clear liquid diet today by general surgery --Further per general surgery  Paroxysmal atrial fibrillation with RVR Currently in NSR on telemetry --Cardiology following, appreciate assistance --CHA2DS2-VASc score = 5 (HTN, age, T2DM, gender) --IV metoprolol 5 mg every 6 hours --Discontinued heparin drip 2/2 bleeding surrounding HD catheter --continue to monitor on telemetry  GERD: Protonix 40 mg IV every 24 hours  Pressure injury of buttock, stage I, POA --Continue local wound care, offloading  Hx COVID-19 viral infection Patient fully vaccinated, received Pfizer vaccine on 10/16/2019 and 11/07/2019.  Covid-19 PCR positive on 06/09/2020.  No overt respiratory symptoms.  CT chest without acute pulmonary abnormality.  Oxygenating well on room air.  Completed 5-day course of IV remdesivir. --Discontinued airborne/contact isolation since greater than 21 days since initial diagnosis   DVT prophylaxis: SCDs Code Status: DNR Family Communication: Updated patient spouse [name] via telephone this afternoon  Disposition Plan:  Status is: Inpatient  Remains inpatient appropriate because:Persistent severe electrolyte disturbances, Ongoing active pain requiring inpatient pain management, Ongoing diagnostic testing needed not appropriate for outpatient work up, Unsafe d/c plan, IV treatments  appropriate due to intensity of illness or inability to take PO and Inpatient level of care appropriate due to severity of illness   Dispo: The patient is from: Home              Anticipated d/c is to: SNF              Anticipated d/c date is: > 3 days              Patient currently is not medically stable to d/c.   Consultants:   Cardiology  General surgery  Nephrology  Urology  Palliative care  Procedures:   Right ureteral stent placement, Dr. Jeffie Pollock 9/19  Right femoral HD catheter placement, Dr. Delana Meyer 9/27  Started on HD 9/28  RIJ PICC 9/27  Exploratory laparotomy, Dr Dahlia Byes 06/30/2020  Antimicrobials:   Zosyn 9/19 - 9/23  Ceftriaxone 9/17 - 9/18  Ciprofloxacin 9/23 - 9/26  Azithromycin 9/17 - 9/17  Vancomycin 9/17 - 9/17  Cefepime 9/17 - 9/17    Subjective: Patient seen and examined bedside, resting comfortably.  No complaints this morning.  Bowel movement overnight.  General surgery advancing diet to clear liquids today.  Renal function slowly improving. Denies headache, no visual changes, no chest pain, palpitations, no shortness of breath, no fever/chills/night sweats.  No acute events overnight per nursing staff.   Objective: Vitals:   07/03/20 0402 07/03/20 0700 07/03/20 0818 07/03/20 1217  BP: (!) 141/60 (!) 168/77 (!) 171/77 (!) 183/90  Pulse: 98  97 (!) 101  Resp: 18  16 16   Temp: 98.6 F (37 C)  97.7 F (36.5 C) 97.7 F (36.5 C)  TempSrc: Oral  Oral   SpO2: 94%  95% 96%  Weight:      Height:        Intake/Output Summary (Last 24 hours)  at 07/03/2020 1328 Last data filed at 07/03/2020 0403 Gross per 24 hour  Intake 634.55 ml  Output 1550 ml  Net -915.45 ml   Filed Weights   06/19/20 0625 06/21/20 0125 06/22/20 0453  Weight: 80.4 kg 82.2 kg 86.8 kg    Examination:  General exam: Appears calm and comfortable, chronically ill in appearance Respiratory system: Clear to auscultation. Respiratory effort normal.  Oxygenating well  on room air with SPO2 95% Cardiovascular system: S1 & S2 heard, RRR. No JVD, murmurs, rubs, gallops or clicks. No pedal edema. Gastrointestinal system: Abdomen is soft, distended, abdominal surgical incision noted with honeycomb in place with small amount of blood present, otherwise, dressing clean/dry/intact. No organomegaly or masses felt.  Faint  bowel sounds noted, NG tube noted in place,  clamped Central nervous system: Alert and oriented. No focal neurological deficits. Extremities: Symmetric 5 x 5 power. Skin: pressure injury stage 1 buttock Psychiatry: Judgement and insight appear normal. Mood & affect appropriate.     Data Reviewed: I have personally reviewed following labs and imaging studies  CBC: Recent Labs  Lab 06/28/20 0530 06/28/20 0530 06/29/20 0645 07/01/20 0526 07/01/20 1518 07/02/20 0513 07/03/20 0430  WBC 6.2  --  7.5 7.6  --  11.7* 11.3*  NEUTROABS  --   --  6.4  --   --   --   --   HGB 8.8*   < > 8.2* 7.1* 9.1* 8.8* 8.5*  HCT 24.7*   < > 23.2* 20.8* 25.5* 26.7* 25.8*  MCV 76.5*  --  77.1* 80.9  --  85.0 85.7  PLT 155  --  136* 145*  --  127* 111*   < > = values in this interval not displayed.   Basic Metabolic Panel: Recent Labs  Lab 06/28/20 0530 06/28/20 1222 06/29/20 0645 06/29/20 0645 06/29/20 1253 06/29/20 1658 06/30/20 0600 07/01/20 0526 07/02/20 0513 07/03/20 0430  NA 133*  --  135   < > 135  --  134* 133*  132* 135  135 138  K 2.5*   < > 2.5*   < > 3.1* 3.2* 3.7 4.1  4.1 4.3  4.3 4.5  CL 87*  --  92*   < > 93*  --  93* 97*  96* 98  98 101  CO2 27  --  29   < > 27  --  27 27  26 27  27 31   GLUCOSE 131*  --  197*   < > 223*  --  314* 371*  367* 294*  298* 257*  BUN 91*  --  62*   < > 60*  --  62* 74*  74* 86*  85* 91*  CREATININE 6.33*  --  4.94*   < > 4.80*  --  4.77* 4.50*  4.84* 4.79*  4.72* 4.38*  CALCIUM 6.5*  --  7.1*   < > 7.3*  --  7.5* 7.8*  7.7* 8.5*  8.4* 8.3*  MG 1.8  --  1.8   < >  --  2.1 2.1 2.0 2.0 1.9    PHOS 9.3*  --  6.5*  --   --   --  5.1* 4.6 3.3  --    < > = values in this interval not displayed.   GFR: Estimated Creatinine Clearance: 11.9 mL/min (A) (by C-G formula based on SCr of 4.38 mg/dL (H)). Liver Function Tests: Recent Labs  Lab 06/29/20 0645 06/30/20 0600 07/01/20 0526 07/02/20 0513  AST  14* 14*  --   --   ALT 13 12  --   --   ALKPHOS 150* 141*  --   --   BILITOT 0.9 0.8  --   --   PROT 4.6* 4.9*  --   --   ALBUMIN 2.1* 2.2* 1.9* 2.0*   No results for input(s): LIPASE, AMYLASE in the last 168 hours. No results for input(s): AMMONIA in the last 168 hours. Coagulation Profile: Recent Labs  Lab 06/27/20 0545  INR 1.6*   Cardiac Enzymes: No results for input(s): CKTOTAL, CKMB, CKMBINDEX, TROPONINI in the last 168 hours. BNP (last 3 results) No results for input(s): PROBNP in the last 8760 hours. HbA1C: No results for input(s): HGBA1C in the last 72 hours. CBG: Recent Labs  Lab 07/02/20 1934 07/02/20 2317 07/03/20 0433 07/03/20 0749 07/03/20 1214  GLUCAP 267* 224* 246* 285* 376*   Lipid Profile: No results for input(s): CHOL, HDL, LDLCALC, TRIG, CHOLHDL, LDLDIRECT in the last 72 hours. Thyroid Function Tests: No results for input(s): TSH, T4TOTAL, FREET4, T3FREE, THYROIDAB in the last 72 hours. Anemia Panel: Recent Labs    07/02/20 0513  FERRITIN 605*  TIBC 195*  IRON 34   Sepsis Labs: No results for input(s): PROCALCITON, LATICACIDVEN in the last 168 hours.  No results found for this or any previous visit (from the past 240 hour(s)).       Radiology Studies: No results found.      Scheduled Meds: . vitamin C  500 mg Oral Daily  . brimonidine  1 drop Both Eyes BID  . chlorhexidine  15 mL Mouth Rinse BID  . Chlorhexidine Gluconate Cloth  6 each Topical Q0600  . epoetin (EPOGEN/PROCRIT) injection  10,000 Units Subcutaneous Weekly  . insulin aspart  0-15 Units Subcutaneous Q4H  . insulin detemir  8 Units Subcutaneous BID  . mouth  rinse  15 mL Mouth Rinse BID  . metoprolol tartrate  5 mg Intravenous Q6H  . pantoprazole (PROTONIX) IV  40 mg Intravenous Q24H  . sodium chloride flush  10-40 mL Intracatheter Q12H  . sodium chloride flush  3 mL Intravenous Q12H  . tamsulosin  0.4 mg Oral Daily  . zinc sulfate  220 mg Oral Daily   Continuous Infusions: . sodium chloride 0 mL (06/27/20 1630)  . TPN ADULT (ION) 65 mL/hr at 07/02/20 1810  . TPN ADULT (ION)       LOS: 16 days    Time spent: 37 minutes spent on chart review, discussion with nursing staff, consultants, updating family and interview/physical exam; more than 50% of that time was spent in counseling and/or coordination of care.    Youssef Footman J [place] DO [facility] Available via Epic secure chat 7am-7pm After these hours, please refer to coverage provider listed on [email or web address] 07/03/2020, 1:28 PM

## 2020-07-03 NOTE — Consult Note (Signed)
PHARMACY - TOTAL PARENTERAL NUTRITION CONSULT NOTE   Indication: Small bowel obstruction  Patient Measurements: Height: 5\' 6"  (167.6 cm) Weight: 86.8 kg (191 lb 6.4 oz) IBW/kg (Calculated) : 59.3 TPN AdjBW (KG): 64.5 Body mass index is 30.89 kg/m. 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  Since admission her renal function worsened and she is now requiring hemodialysis, Currently with with combination of paralytic ileus secondary to electrolyte derangements (improved) and partial SBO given previous abdominal surgeries and CT findings on 09/24  Glucose / Insulin:  BG last 24 hours: 224 - 304, required 45 units SSI, Levemir 4 units in am and increased to 6 units in pm yest 10/2- then 6 units BID A1C 8.0 Electrolytes: hyponatremia, hyperphosphatemia improving. Phos 4.6>3.3 Renal: worsening since admission s/p 1st HD session 9/28, monitor daily for need for dialysis. Scr 4.38 LFTs / TGs: LFTs wnl -  baseline TG 93 Prealbumin / albumin: albumin  2.7 Intake / Output; MIVF: Urine output 1.8 L over the preceding 24 hours. - no MIVF GI Imaging:  9/29 Abd xray - stable small bowel dilation noted concerning for distal SBO 9/30 Abd xray - persistent SBO Surgeries / Procedures:  9/18  right ureteral stent placement 9/30 exploratory laparotomy and lysis of adhesions  Central access: 06/27/20 - CVC triple lumen R internal jugular TPN start date: 06/28/20  Nutritional Goals (per RD recommendation on 9/28): Kcal: 1900-2100 Protein: 95-105 grams Fluid: UOP + 1 L  Current Nutrition:  CLD 10/3  Plan:   continue TPN at 47mL/hr  Nutritional components:   amino acids: 64 g/L (99.8g)  dextrose: 19% (296.4g)  lipids 39 g/L (60.8g)  kCal:  2015  Total volume: 1660 mL (minimum amount possible to accomplish nutrition goals)  Electrolytes in TPN: standard electrolytes except 2mmol/L of Phos, K 4.5. will slighlty decrease KCL in TPN from 50 to 45 meg/L,   Cl:Ac ratio 1:1    Will order 4 units x 1 of Levemir this am (in addition to the 6 units already given for a total of 10 units this am 10/3) and then will increase to 8 units insulin levemir BID  Add standard MVI and trace elements to TPN (holding chromium)  SSI moderate q4h and adjust as needed   Monitor TPN labs on Mon/Thurs, daily until stable  Vieno Tarrant A, PharmD, BCPS Clinical Pharmacist 07/03/2020 11:02 AM

## 2020-07-04 DIAGNOSIS — A419 Sepsis, unspecified organism: Secondary | ICD-10-CM | POA: Diagnosis not present

## 2020-07-04 DIAGNOSIS — N10 Acute tubulo-interstitial nephritis: Secondary | ICD-10-CM | POA: Diagnosis not present

## 2020-07-04 DIAGNOSIS — N179 Acute kidney failure, unspecified: Secondary | ICD-10-CM | POA: Diagnosis not present

## 2020-07-04 DIAGNOSIS — D6869 Other thrombophilia: Secondary | ICD-10-CM | POA: Diagnosis not present

## 2020-07-04 LAB — DIFFERENTIAL
Abs Immature Granulocytes: 0.06 10*3/uL (ref 0.00–0.07)
Basophils Absolute: 0 10*3/uL (ref 0.0–0.1)
Basophils Relative: 0 %
Eosinophils Absolute: 0.1 10*3/uL (ref 0.0–0.5)
Eosinophils Relative: 1 %
Immature Granulocytes: 1 %
Lymphocytes Relative: 6 %
Lymphs Abs: 0.5 10*3/uL — ABNORMAL LOW (ref 0.7–4.0)
Monocytes Absolute: 0.8 10*3/uL (ref 0.1–1.0)
Monocytes Relative: 10 %
Neutro Abs: 6.7 10*3/uL (ref 1.7–7.7)
Neutrophils Relative %: 82 %

## 2020-07-04 LAB — COMPREHENSIVE METABOLIC PANEL
ALT: 9 U/L (ref 0–44)
AST: 12 U/L — ABNORMAL LOW (ref 15–41)
Albumin: 1.9 g/dL — ABNORMAL LOW (ref 3.5–5.0)
Alkaline Phosphatase: 111 U/L (ref 38–126)
Anion gap: 11 (ref 5–15)
BUN: 94 mg/dL — ABNORMAL HIGH (ref 8–23)
CO2: 24 mmol/L (ref 22–32)
Calcium: 8.3 mg/dL — ABNORMAL LOW (ref 8.9–10.3)
Chloride: 97 mmol/L — ABNORMAL LOW (ref 98–111)
Creatinine, Ser: 3.79 mg/dL — ABNORMAL HIGH (ref 0.44–1.00)
GFR calc Af Amer: 13 mL/min — ABNORMAL LOW (ref >60)
GFR calc non Af Amer: 11 mL/min — ABNORMAL LOW (ref >60)
Glucose, Bld: 310 mg/dL — ABNORMAL HIGH (ref 70–99)
Potassium: 4.1 mmol/L (ref 3.5–5.1)
Sodium: 132 mmol/L — ABNORMAL LOW (ref 135–145)
Total Bilirubin: 0.5 mg/dL (ref 0.3–1.2)
Total Protein: 4.8 g/dL — ABNORMAL LOW (ref 6.5–8.1)

## 2020-07-04 LAB — TRIGLYCERIDES: Triglycerides: 121 mg/dL (ref <150)

## 2020-07-04 LAB — GLUCOSE, CAPILLARY
Glucose-Capillary: 260 mg/dL — ABNORMAL HIGH (ref 70–99)
Glucose-Capillary: 287 mg/dL — ABNORMAL HIGH (ref 70–99)
Glucose-Capillary: 289 mg/dL — ABNORMAL HIGH (ref 70–99)
Glucose-Capillary: 296 mg/dL — ABNORMAL HIGH (ref 70–99)
Glucose-Capillary: 296 mg/dL — ABNORMAL HIGH (ref 70–99)
Glucose-Capillary: 296 mg/dL — ABNORMAL HIGH (ref 70–99)

## 2020-07-04 LAB — CBC
HCT: 25.4 % — ABNORMAL LOW (ref 36.0–46.0)
Hemoglobin: 8.5 g/dL — ABNORMAL LOW (ref 12.0–15.0)
MCH: 28.5 pg (ref 26.0–34.0)
MCHC: 33.5 g/dL (ref 30.0–36.0)
MCV: 85.2 fL (ref 80.0–100.0)
Platelets: 85 10*3/uL — ABNORMAL LOW (ref 150–400)
RBC: 2.98 MIL/uL — ABNORMAL LOW (ref 3.87–5.11)
RDW: 17.2 % — ABNORMAL HIGH (ref 11.5–15.5)
WBC: 8.1 10*3/uL (ref 4.0–10.5)
nRBC: 0 % (ref 0.0–0.2)

## 2020-07-04 LAB — PREALBUMIN: Prealbumin: 17.4 mg/dL — ABNORMAL LOW (ref 18–38)

## 2020-07-04 LAB — MAGNESIUM: Magnesium: 1.7 mg/dL (ref 1.7–2.4)

## 2020-07-04 LAB — PHOSPHORUS: Phosphorus: 2.1 mg/dL — ABNORMAL LOW (ref 2.5–4.6)

## 2020-07-04 MED ORDER — TRACE MINERALS CU-MN-SE-ZN 300-55-60-3000 MCG/ML IV SOLN
INTRAVENOUS | Status: AC
  Filled 2020-07-04: qty 665.6

## 2020-07-04 MED ORDER — DILTIAZEM HCL 25 MG/5ML IV SOLN
10.0000 mg | Freq: Once | INTRAVENOUS | Status: AC
  Administered 2020-07-04: 10 mg via INTRAVENOUS
  Filled 2020-07-04: qty 5

## 2020-07-04 MED ORDER — METOPROLOL TARTRATE 50 MG PO TABS
50.0000 mg | ORAL_TABLET | Freq: Two times a day (BID) | ORAL | Status: DC
  Administered 2020-07-04 – 2020-07-10 (×13): 50 mg via ORAL
  Filled 2020-07-04 (×13): qty 1

## 2020-07-04 MED ORDER — MORPHINE SULFATE (PF) 2 MG/ML IV SOLN: 2.0000 mg | INTRAVENOUS | Status: DC | PRN

## 2020-07-04 MED ORDER — DILTIAZEM HCL 25 MG/5ML IV SOLN
15.0000 mg | Freq: Once | INTRAVENOUS | Status: AC
  Administered 2020-07-04: 15 mg via INTRAVENOUS
  Filled 2020-07-04 (×2): qty 5

## 2020-07-04 MED ORDER — THIAMINE HCL 100 MG/ML IJ SOLN
100.0000 mg | Freq: Once | INTRAMUSCULAR | Status: AC
  Administered 2020-07-04: 100 mg via INTRAVENOUS
  Filled 2020-07-04: qty 2

## 2020-07-04 MED ORDER — MAGNESIUM SULFATE 2 GM/50ML IV SOLN
2.0000 g | Freq: Once | INTRAVENOUS | Status: AC
  Administered 2020-07-04: 2 g via INTRAVENOUS
  Filled 2020-07-04: qty 50

## 2020-07-04 NOTE — Progress Notes (Signed)
[facility] Day(s): [place] op day(s): 4 Days Post-Op.   Interval History:  Patient seen and examined no acute events or new complaints overnight.  Patient reports her abdomen feels "tight this morning" No abdominal pain, nausea, emesis WBC has normalized this morning, now 8.1K Hgb stable at 8.5 Renal function with some improvement, sCr - 3.79, eGFR 11, UO - 2.8L in last 24 hours; nephrology following  Mild hypophosphatemia, otherwise no significant electrolyte derangements NGT removed on 10/03 CLD initiated, which she has tolerated well No BMs reported in last 24 hours Worked with therapies; recommendations for SNF   Vital signs in last 24 hours: [min-max] current  Temp:  [97.7 F (36.5 C)-99.3 F (37.4 C)] 99 F (37.2 C) (10/04 0700) Pulse Rate:  [92-132] 92 (10/04 0700) Resp:  [16-24] 20 (10/04 0700) BP: (149-192)/(74-100) 151/92 (10/04 0700) SpO2:  [95 %-97 %] 97 % (10/04 0700)     Height: $Remove'5\' 6"'uvoTvKS$  (167.6 cm) Weight: 86.8 kg BMI (Calculated): 30.91   Intake/Output last 2 shifts:  10/03 0701 - 10/04 0700 In: 1729.3 [P.O.:360; I.V.:1369.3] Out: 2800 [Urine:2800]   Physical Exam:  Constitutional: alert, cooperative and no distress  HENT: normocephalic without obvious abnormality Eyes: PERRL, EOM's grossly intact and symmetric  Respiratory: breathing non-labored at rest  Cardiovascular: regular rate and sinus rhythm  Gastrointestinal:Soft,no appreciable soreness, she still remains distended and tympanic, certainly no rebound/guarding/peritonitis Integumentary: Laparotomy incision is CDI with staples, no erythema Musculoskeletal: Marked edema, +2, to bilateral LE  Labs:  CBC Latest Ref Rng & Units 07/03/2020 07/02/2020 07/01/2020  WBC 4.0 - 10.5 K/uL 11.3(H) 11.7(H) -  Hemoglobin 12.0 - 15.0 g/dL 8.5(L) 8.8(L) 9.1(L)  Hematocrit 36 - 46 % 25.8(L) 26.7(L) 25.5(L)  Platelets 150 - 400 K/uL 111(L) 127(L) -   CMP Latest  Ref Rng & Units 07/03/2020 07/02/2020 07/02/2020  Glucose 70 - 99 mg/dL 257(H) 294(H) 298(H)  BUN 8 - 23 mg/dL 91(H) 86(H) 85(H)  Creatinine 0.44 - 1.00 mg/dL 4.38(H) 4.79(H) 4.72(H)  Sodium 135 - 145 mmol/L 138 135 135  Potassium 3.5 - 5.1 mmol/L 4.5 4.3 4.3  Chloride 98 - 111 mmol/L 101 98 98  CO2 22 - 32 mmol/L $RemoveB'31 27 27  'EbRJtUsw$ Calcium 8.9 - 10.3 mg/dL 8.3(L) 8.5(L) 8.4(L)  Total Protein 6.5 - 8.1 g/dL - - -  Total Bilirubin 0.3 - 1.2 mg/dL - - -  Alkaline Phos 38 - 126 U/L - - -  AST 15 - 41 U/L - - -  ALT 0 - 44 U/L - - -     Imaging studies: No new pertinent imaging studies   Assessment/Plan:  79 y.o. female 4 Days Post-Op s/p exploratory laparotomy and lysis of adhesions for partial small bowel obstruction secondary to post-surgical adhesions, complicated by pertinent comorbidities including debilitation, numerousmedicalconditionsnow with ESRD.   - Continue CLD this morning given abdominal distension  - Continue TPN at goal rate for now; monitor electrolytes  - Monitor abdominal examination; on-going bowel function   - Pain control prn; antiemetics prn  - appreciate nephrology input; ? Benefit from diuretic given edema secondary to likely volume overload, although doubt renal function will tolerate this; defer to medicine/nephrology              - Mobilization encouraged; PT following --> recommendations for SNF  - Further management per primary team; we will follow   All of the above findings and recommendations were discussed with the patient, and the  medical team, and all of patient's questions were answered to her expressed satisfaction.  -- Edison Simon, PA-C [facility] 07/04/2020, 7:25 AM [phone] M-F: 7am - 4pm

## 2020-07-04 NOTE — Progress Notes (Signed)
Inpatient Diabetes Program Recommendations  AACE/ADA: New Consensus Statement on Inpatient Glycemic Control   Target Ranges:  Prepandial:   less than 140 mg/dL      Peak postprandial:   less than 180 mg/dL (1-2 hours)      Critically ill patients:  140 - 180 mg/dL   Results for CARLYON, NOLASCO (MRN [id number]) as of 07/04/2020 09:40  Ref. Range 07/03/2020 07:49 07/03/2020 12:14 07/03/2020 16:17 07/03/2020 20:14 07/04/2020 00:49 07/04/2020 04:26 07/04/2020 07:39  Glucose-Capillary Latest Ref Range: 70 - 99 mg/dL 285 (H) 376 (H) 333 (H) 342 (H) 287 (H) 296 (H) 296 (H)   Review of Glycemic Control  Diabetes history: DM2 Outpatient Diabetes medications: Glipizide 10 mg daily, Metformin XR 500 mg BID Current orders for Inpatient glycemic control: Levemir 8 units BID, Novolog 0-15 units Q4H; TPN @ 65 ml/hr  Inpatient Diabetes Program Recommendations:    Insulin: Please consider adding 50 units of insulin to TPN.  Thanks, Barnie Alderman, RN, MSN, CDE Diabetes Coordinator Inpatient Diabetes Program [phone] (Team Pager from 8am to 5pm)

## 2020-07-04 NOTE — Progress Notes (Signed)
   07/04/20 0756  Vitals  Temp 99.1 F (37.3 C)  Temp Source Oral  BP (!) 154/97  BP Location Left Arm  BP Method Automatic  Patient Position (if appropriate) Lying  Pulse Rate (!) 105  Pulse Rate Source Dinamap  MEWS COLOR  MEWS Score Color Green  Oxygen Therapy  SpO2 97 %  O2 Device Room Air    Patient's heart rate staying elevated(see epic) with current reading of 105, BP 154/97, and Temp 99.1. Carolanne Grumbling, PA at patient's bedside and has been informed. Per provider continue to monitor and will consult with Dr. [place] with update.   Thresa Ross

## 2020-07-04 NOTE — Progress Notes (Signed)
   07/04/20 0453  Assess: MEWS Score  Temp 98.7 F (37.1 C)  BP (!) 150/74  Pulse Rate (!) 132  Resp (!) 24  Level of Consciousness Alert  SpO2 95 %  O2 Device Room Air  Assess: MEWS Score  MEWS Temp 0  MEWS Systolic 0  MEWS Pulse 3  MEWS RR 1  MEWS LOC 0  MEWS Score 4  MEWS Score Color Red  Assess: if the MEWS score is Yellow or Red  Were vital signs taken at a resting state? Yes  Focused Assessment Change from prior assessment (see assessment flowsheet)  Early Detection of Sepsis Score *See Row Information* High  MEWS guidelines implemented *See Row Information* Yes  Treat  MEWS Interventions Administered scheduled meds/treatments

## 2020-07-04 NOTE — Progress Notes (Signed)
Physical Therapy Treatment Patient Details Name: Stephanie [name] MRN: [id number] DOB: 1941/01/27 Today's Date: 07/04/2020    History of Present Illness Stephanie [name] is a 79 y/o F w/ PMH: COVID infection, DM, RA, HTN, remote colon CA, who comes to [facility] on 9/17. Patient found to have klebsiella sepsis and had right sided pyelonephritis. She underwent right ureteral stent placed on on 9/18. Pt with acute on chronic renal failure. Temporary fem cath placed 9/27. Pt also with SBO requiring TPN so R IJ placed 9/27 as well. Pt s/p exploratory lap and lysis of adhesions 9/30 with continue at transfer order in place.    PT Comments    Pt presents supine in bed on arrival to room. She is agreeable to bed level exercises due to temporary femoral catheter. Pt performed LE exercises with instructions to perform throughout her day. She is encouraged to use IS and flutter once every hour. She is more alert and able to participate well in session this morning, however still demonstrating decreased endurance/strength with exercises. Pt will benefit from skilled PT services to address deficits in strength, balance, and decrease risk for future falls.   Follow Up Recommendations  SNF;Supervision for mobility/OOB;Supervision - Intermittent     Equipment Recommendations  None recommended by PT    Recommendations for Other Services       Precautions / Restrictions Precautions Precautions: Fall Precaution Comments: Pt has temp R femoral catheter Restrictions Weight Bearing Restrictions: No Other Position/Activity Restrictions: Pt with temporary femcath to R LE, no sitting up.    Mobility  Bed Mobility               General bed mobility comments: not performed as pt with R temporary fem-cath  Transfers                 General transfer comment: Not performed due to R temporary fem-cath  Ambulation/Gait             General Gait Details: Not performed due to temp R fem-cath   Stairs              Wheelchair Mobility    Modified Rankin (Stroke Patients Only)       Balance       Sitting balance - Comments: Did not assess sitting balance this session       Standing balance comment: Did not assess standing balance this session                            Cognition Arousal/Alertness: Awake/alert Behavior During Therapy: WFL for tasks assessed/performed Overall Cognitive Status: Within Functional Limits for tasks assessed                                 General Comments: [facility] is [facility] and requires some increased processing time      Exercises General Exercises - Upper Extremity Elbow Flexion: AROM;Both;5 reps;Supine Elbow Extension: AROM;Both;10 reps;Supine General Exercises - Lower Extremity Ankle Circles/Pumps: AROM;Both;10 reps;Supine Quad Sets: AROM;Both;5 reps;Supine Gluteal Sets: AROM;Both;10 reps;Supine Hip ABduction/ADduction: AAROM;Both;10 reps;Supine Other Exercises Other Exercises: Shoulder shrugs x 10, scapular retracts x 10, flutter breather x 10, ~625-750 mL with IS x 10 breaths    General Comments General comments (skin integrity, edema, etc.): Pt's R wrist swollen and red with bruises along her R forearm; some bruises on her L arm; Edema BLE  Pertinent Vitals/Pain Pain Assessment: No/denies pain    Home Living                      Prior Function            PT Goals (current goals can now be found in the care plan section) Acute Rehab PT Goals Patient Stated Goal: go home PT Goal Formulation: With patient Time For Goal Achievement: 07/07/20 Potential to Achieve Goals: Fair Progress towards PT goals: Progressing toward goals    Frequency    Min 2X/week      PT Plan Current plan remains appropriate    Co-evaluation              AM-PAC PT "6 Clicks" Mobility   Outcome Measure  Help needed turning from your back to your side while in a flat bed without using bedrails?:  A Lot Help needed moving from lying on your back to sitting on the side of a flat bed without using bedrails?: A Lot Help needed moving to and from a bed to a chair (including a wheelchair)?: Total Help needed standing up from a chair using your arms (e.g., wheelchair or bedside chair)?: Total Help needed to walk in hospital room?: Total Help needed climbing 3-5 steps with a railing? : Total 6 Click Score: 8    End of Session   Activity Tolerance: Patient limited by fatigue;No increased pain Patient left: in bed;with call bell/phone within reach;with bed alarm set Nurse Communication: Mobility status PT Visit Diagnosis: Other abnormalities of gait and mobility (R26.89);Muscle weakness (generalized) (M62.81);Dizziness and giddiness (R42);Difficulty in walking, not elsewhere classified (R26.2)     Time: 3709-6438 PT Time Calculation (min) (ACUTE ONLY): 11 min  Charges:                         Noemi Chapel, SPT Bernita Raisin 07/04/2020, 11:10 AM

## 2020-07-04 NOTE — Consult Note (Signed)
PHARMACY - TOTAL PARENTERAL NUTRITION CONSULT NOTE   Indication: Small bowel obstruction  Patient Measurements: Height: 5\' 6"  (167.6 cm) Weight: 86.8 kg (191 lb 6.4 oz) IBW/kg (Calculated) : 59.3 TPN AdjBW (KG): 64.5 Body mass index is 30.89 kg/m. 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  Since admission her renal function worsened and she is now requiring hemodialysis, Currently with with combination of paralytic ileus secondary to electrolyte derangements (improved) and partial SBO given previous abdominal surgeries and CT findings on 09/24  Glucose / Insulin:  BG last 24 hours: 296 - 376, required 69 units SSI, Levemir 8u BID A1C 8.0 Electrolytes: hyponatremia (secondary to hyperglycemia, fluid overload), hypophosphatemia Renal: Scr decreasing. Good UOP. Nephrology following with daily assessments for iHD. Last HD session was 9/28. LFTs / TGs: LFTs wnl - TG 93 >> 121 Prealbumin / albumin: 12.1 / 1.9 Intake / Output; MIVF: Urine output 2.8 L over the preceding 24 hours - no MIVF GI Imaging:  9/29 Abd xray - stable small bowel dilation noted concerning for distal SBO 9/30 Abd xray - persistent SBO Surgeries / Procedures:  9/18  right ureteral stent placement 9/30 exploratory laparotomy and lysis of adhesions  Central access: 06/27/20 - CVC triple lumen R internal jugular TPN start date: 06/28/20  Nutritional Goals (per RD recommendation on 9/28): Kcal: 1900-2100 Protein: 95-105 grams Fluid: UOP + 1 L  Current Nutrition:  CLD 10/3  Plan:   Continue TPN at 70mL/hr  Nutritional components:   amino acids: 64 g/L (99.8g)  dextrose: 19% (296.4g)  lipids 39 g/L (60.8g)  kCal: 2015  Total volume: 1660 mL (minimum  amount possible to accomplish nutrition goals)  Electrolytes in TPN: standard electrolytes. Will add 5 mmol/L of Phos, increase magnesium to 7 mEq/L, continue 45 mEq/L of potassium, Cl:Ac ratio 1:1  Targeting magnesium > 2 considering renal function may be beneficial given Afib with RVR  Hyperglycemia: SSI 0-15u q4h + Levemir 8u BID (increased 10/3). Will further add 50u regular insulin to TPN per diabetes coordinator recommendations  Add standard MVI and trace elements to TPN (holding chromium)  Continue to assess need for MIVF based on I&Os. Per surgical note, patient with edema on exam  Monitor TPN labs on Mon/Thurs, daily until stable  Benita Gutter 07/04/2020 10:02 AM

## 2020-07-04 NOTE — Progress Notes (Signed)
[facility]  ROUNDING NOTE   Subjective:   Patient resting in bed, in no acute distress.  NGT out, she is receiving TPN.  Denies worsening shortness of breath, nausea or vomiting.  Foley catheter draining dark yellow urine output.  10/03 0701 - 10/04 0700 In: 1729.3 [P.O.:360; I.V.:1369.3] Out: 2800 [Urine:2800]   Objective:  Vital signs in last 24 hours:  Temp:  [97.7 F (36.5 C)-99.3 F (37.4 C)] 99 F (37.2 C) (10/04 0919) Pulse Rate:  [77-132] 77 (10/04 0919) Resp:  [16-24] 18 (10/04 0919) BP: (138-192)/(72-100) 138/72 (10/04 0919) SpO2:  [95 %-98 %] 98 % (10/04 0919)  Weight change:  Filed Weights   06/19/20 0625 06/21/20 0125 06/22/20 0453  Weight: 80.4 kg 82.2 kg 86.8 kg    Intake/Output: I/O last 3 completed shifts: In: 2363.9 [P.O.:360; I.V.:2003.9] Out: 3900 [Urine:3900]   Intake/Output this shift:  Total I/O In: 240 [P.O.:240] Out: -   Physical Exam: General:  Fragile, chronically ill-appearing  Head:  Normocephalic, atraumatic  Lungs:    lungs with crackles bilaterally  Heart: Regular, no rubs or gallops  Abdomen:  +tenderness on LUQ, mildly distended  Extremities: 1+ peripheral edema.  Neurologic:  Awake, alert ,speech clear and appropriate  Skin: No new rashes or lesions  Access: Rt fem temp dialysis  catheter placed on 4/58/09    Basic Metabolic Panel: Recent Labs  Lab 06/29/20 0645 06/29/20 1253 06/30/20 0600 06/30/20 0600 07/01/20 0526 07/01/20 0526 07/02/20 0513 07/03/20 0430 07/04/20 0607  NA 135   < > 134*  --  133*  132*  --  135  135 138 132*  K 2.5*   < > 3.7  --  4.1  4.1  --  4.3  4.3 4.5 4.1  CL 92*   < > 93*  --  97*  96*  --  98  98 101 97*  CO2 29   < > 27  --  27  26  --  27  27 31 24   GLUCOSE 197*   < > 314*  --  371*  367*  --  294*  298* 257* 310*  BUN 62*   < > 62*  --  74*  74*  --  86*  85* 91* 94*  CREATININE 4.94*   < > 4.77*  --  4.50*  4.84*  --  4.79*  4.72* 4.38* 3.79*  CALCIUM  7.1*   < > 7.5*   < > 7.8*  7.7*   < > 8.5*  8.4* 8.3* 8.3*  MG 1.8   < > 2.1  --  2.0  --  2.0 1.9 1.7  PHOS 6.5*  --  5.1*  --  4.6  --  3.3  --  2.1*   < > = values in this interval not displayed.    Liver Function Tests: Recent Labs  Lab 06/29/20 0645 06/30/20 0600 07/01/20 0526 07/02/20 0513 07/04/20 0607  AST 14* 14*  --   --  12*  ALT 13 12  --   --  9  ALKPHOS 150* 141*  --   --  111  BILITOT 0.9 0.8  --   --  0.5  PROT 4.6* 4.9*  --   --  4.8*  ALBUMIN 2.1* 2.2* 1.9* 2.0* 1.9*   No results for input(s): LIPASE, AMYLASE in the last 168 hours. No results for input(s): AMMONIA in the last 168 hours.  CBC: Recent Labs  Lab 06/29/20 0645 06/29/20 0645  07/01/20 0526 07/01/20 1518 07/02/20 0513 07/03/20 0430 07/04/20 0607  WBC 7.5  --  7.6  --  11.7* 11.3* 8.1  NEUTROABS 6.4  --   --   --   --   --  6.7  HGB 8.2*   < > 7.1* 9.1* 8.8* 8.5* 8.5*  HCT 23.2*   < > 20.8* 25.5* 26.7* 25.8* 25.4*  MCV 77.1*  --  80.9  --  85.0 85.7 85.2  PLT 136*  --  145*  --  127* 111* 85*   < > = values in this interval not displayed.    Cardiac Enzymes: No results for input(s): CKTOTAL, CKMB, CKMBINDEX, TROPONINI in the last 168 hours.  BNP: Invalid input(s): POCBNP  CBG: Recent Labs  Lab 07/03/20 1617 07/03/20 2014 07/04/20 0049 07/04/20 0426 07/04/20 0739  GLUCAP 333* 342* 287* 296* 296*    Microbiology: Results for orders placed or performed during the hospital encounter of 06/17/20  Blood culture (routine x 2)     Status: Abnormal   Collection Time: 06/17/20  3:57 AM   Specimen: BLOOD  Result Value Ref Range Status   Specimen Description   Final    BLOOD LEFT HAND Performed at [facility], [address] [place], [place] [address]    Special Requests   Final    BOTTLES DRAWN AEROBIC AND ANAEROBIC Blood Culture adequate volume Performed at [facility], [address] [place], [place] [address]    Culture  Setup Time   Final     Organism ID to follow GRAM NEGATIVE RODS IN BOTH AEROBIC AND ANAEROBIC BOTTLES CRITICAL RESULT CALLED TO, READ BACK BY AND VERIFIED WITH: AMY THOMPSON AT 3734 ON 06/17/20 [place] Performed at [facility], [address] [place], [place] [address]    Culture KLEBSIELLA PNEUMONIAE (A)  Final   Report Status 06/19/2020 FINAL  Final   Organism ID, Bacteria KLEBSIELLA PNEUMONIAE  Final      Susceptibility   Klebsiella pneumoniae - MIC*    AMPICILLIN RESISTANT Resistant     CEFAZOLIN <=4 SENSITIVE Sensitive     CEFEPIME <=0.12 SENSITIVE Sensitive     CEFTAZIDIME <=1 SENSITIVE Sensitive     CEFTRIAXONE <=0.25 SENSITIVE Sensitive     CIPROFLOXACIN <=0.25 SENSITIVE Sensitive     GENTAMICIN <=1 SENSITIVE Sensitive     IMIPENEM <=0.25 SENSITIVE Sensitive     TRIMETH/SULFA <=20 SENSITIVE Sensitive     AMPICILLIN/SULBACTAM 4 SENSITIVE Sensitive     PIP/TAZO <=4 SENSITIVE Sensitive     * KLEBSIELLA PNEUMONIAE  Blood culture (routine x 2)     Status: Abnormal   Collection Time: 06/17/20  3:57 AM   Specimen: BLOOD  Result Value Ref Range Status   Specimen Description   Final    BLOOD RIGHT [facility] Performed at [facility], [address], [place], [place] [address]    Special Requests   Final    BOTTLES DRAWN AEROBIC AND ANAEROBIC Blood Culture adequate volume Performed at [facility], [address] [place], [place] [address]    Culture  Setup Time   Final    GRAM NEGATIVE RODS IN BOTH AEROBIC AND ANAEROBIC BOTTLES CRITICAL VALUE NOTED.  VALUE IS CONSISTENT WITH PREVIOUSLY REPORTED AND CALLED VALUE. Performed at [facility], [address] [place],  [address]    Culture (A)  Final    KLEBSIELLA PNEUMONIAE SUSCEPTIBILITIES PERFORMED ON PREVIOUS CULTURE WITHIN THE LAST 5 DAYS. Performed at [facility], [address], [place], [place] [address]    Report Status 06/19/2020 FINAL  Final  Blood Culture ID Panel (Reflexed)     Status:  Abnormal   Collection Time: 06/17/20  3:57 AM  Result Value Ref Range Status   Enterococcus faecalis NOT DETECTED NOT DETECTED Final   Enterococcus Faecium NOT DETECTED NOT DETECTED Final   Listeria monocytogenes NOT DETECTED NOT DETECTED Final   Staphylococcus species NOT DETECTED NOT DETECTED Final   Staphylococcus aureus (BCID) NOT DETECTED NOT DETECTED Final   Staphylococcus epidermidis NOT DETECTED NOT DETECTED Final   Staphylococcus lugdunensis NOT DETECTED NOT DETECTED Final   Streptococcus species NOT DETECTED NOT DETECTED Final   Streptococcus agalactiae NOT DETECTED NOT DETECTED Final   Streptococcus pneumoniae NOT DETECTED NOT DETECTED Final   Streptococcus pyogenes NOT DETECTED NOT DETECTED Final   A.calcoaceticus-baumannii NOT DETECTED NOT DETECTED Final   Bacteroides fragilis NOT DETECTED NOT DETECTED Final   Enterobacterales DETECTED (A) NOT DETECTED Final    Comment: Enterobacterales represent a large order of gram negative bacteria, not a single organism. CRITICAL RESULT CALLED TO, READ BACK BY AND VERIFIED WITH: AMY THOMPSON AT 2035 ON 06/17/20 SNG    Enterobacter cloacae complex NOT DETECTED NOT DETECTED Final   Escherichia coli NOT DETECTED NOT DETECTED Final   Klebsiella aerogenes NOT DETECTED NOT DETECTED Final   Klebsiella oxytoca NOT DETECTED NOT DETECTED Final   Klebsiella pneumoniae DETECTED (A) NOT DETECTED Final    Comment: CRITICAL RESULT CALLED TO, READ BACK BY AND VERIFIED WITH: AMY THOMPSON RN AT 5974 ON 06/17/20 SNG    Proteus species NOT DETECTED NOT DETECTED Final   Salmonella species NOT DETECTED NOT DETECTED Final   Serratia marcescens NOT DETECTED NOT DETECTED Final   Haemophilus influenzae NOT DETECTED NOT DETECTED Final   Neisseria meningitidis NOT DETECTED NOT DETECTED Final   Pseudomonas aeruginosa NOT DETECTED NOT DETECTED Final   Stenotrophomonas maltophilia NOT DETECTED NOT DETECTED Final   Candida albicans NOT DETECTED NOT DETECTED Final    Candida auris NOT DETECTED NOT DETECTED Final   Candida glabrata NOT DETECTED NOT DETECTED Final   Candida krusei NOT DETECTED NOT DETECTED Final   Candida parapsilosis NOT DETECTED NOT DETECTED Final   Candida tropicalis NOT DETECTED NOT DETECTED Final   Cryptococcus neoformans/gattii NOT DETECTED NOT DETECTED Final   CTX-M ESBL NOT DETECTED NOT DETECTED Final   Carbapenem resistance IMP NOT DETECTED NOT DETECTED Final   Carbapenem resistance KPC NOT DETECTED NOT DETECTED Final   Carbapenem resistance NDM NOT DETECTED NOT DETECTED Final   Carbapenem resist OXA 48 LIKE NOT DETECTED NOT DETECTED Final   Carbapenem resistance VIM NOT DETECTED NOT DETECTED Final    Comment: Performed at [facility], [address], [place], [place] [address]  Urine Culture     Status: Abnormal   Collection Time: 06/17/20 11:01 AM   Specimen: Urine, Random  Result Value Ref Range Status   Specimen Description   Final    URINE, RANDOM Performed at [facility], [address], [place], [place] [address]    Special Requests   Final    NONE Performed at [facility], [address] [place], [place] [address]    Culture >=100,000 COLONIES/mL KLEBSIELLA PNEUMONIAE (A)  Final   Report Status 06/19/2020 FINAL  Final   Organism ID, Bacteria KLEBSIELLA PNEUMONIAE (A)  Final      Susceptibility   Klebsiella pneumoniae - MIC*    AMPICILLIN RESISTANT Resistant  CEFAZOLIN <=4 SENSITIVE Sensitive     CEFTRIAXONE <=0.25 SENSITIVE Sensitive     CIPROFLOXACIN <=0.25 SENSITIVE Sensitive     GENTAMICIN <=1 SENSITIVE Sensitive     IMIPENEM <=0.25 SENSITIVE Sensitive     NITROFURANTOIN 32 SENSITIVE Sensitive     TRIMETH/SULFA <=20 SENSITIVE Sensitive     AMPICILLIN/SULBACTAM 4 SENSITIVE Sensitive     PIP/TAZO <=4 SENSITIVE Sensitive     * >=100,000 COLONIES/mL KLEBSIELLA PNEUMONIAE  Culture, blood (Routine X 2) w Reflex to ID Panel     Status: None   Collection Time: 06/21/20  11:07 AM   Specimen: BLOOD  Result Value Ref Range Status   Specimen Description BLOOD RIGHT [facility]  Final   Special Requests   Final    BOTTLES DRAWN AEROBIC AND ANAEROBIC Blood Culture adequate volume   Culture   Final    NO GROWTH 5 DAYS Performed at [facility], [address], [place], [place] [address]    Report Status 06/26/2020 FINAL  Final  Culture, blood (Routine X 2) w Reflex to ID Panel     Status: None   Collection Time: 06/21/20 11:09 AM   Specimen: BLOOD  Result Value Ref Range Status   Specimen Description BLOOD LEFTHAND  Final   Special Requests   Final    BOTTLES DRAWN AEROBIC AND ANAEROBIC Blood Culture adequate volume   Culture   Final    NO GROWTH 5 DAYS Performed at [facility], [address], [place], [place] [address]    Report Status 06/26/2020 FINAL  Final    Coagulation Studies: No results for input(s): LABPROT, INR in the last 72 hours.  Urinalysis: No results for input(s): COLORURINE, LABSPEC, PHURINE, GLUCOSEU, HGBUR, BILIRUBINUR, KETONESUR, PROTEINUR, UROBILINOGEN, NITRITE, LEUKOCYTESUR in the last 72 hours.  Invalid input(s): APPERANCEUR    Imaging: No results found.   Medications:   . sodium chloride 0 mL (06/27/20 1630)  . TPN ADULT (ION) 65 mL/hr at 07/04/20 0500  . TPN ADULT (ION)     . vitamin C  500 mg Oral Daily  . brimonidine  1 drop Both Eyes BID  . chlorhexidine  15 mL Mouth Rinse BID  . Chlorhexidine Gluconate Cloth  6 each Topical Q0600  . epoetin (EPOGEN/PROCRIT) injection  10,000 Units Subcutaneous Weekly  . insulin aspart  0-15 Units Subcutaneous Q4H  . insulin detemir  8 Units Subcutaneous BID  . mouth rinse  15 mL Mouth Rinse BID  . metoprolol tartrate  50 mg Oral BID  . pantoprazole (PROTONIX) IV  40 mg Intravenous Q24H  . sodium chloride flush  10-40 mL Intracatheter Q12H  . tamsulosin  0.4 mg Oral Daily  . thiamine injection  100 mg Intravenous Once  . zinc sulfate  220 mg Oral Daily    sodium chloride, acetaminophen, albuterol, guaiFENesin-dextromethorphan, heparin, labetalol, morphine injection, ondansetron (ZOFRAN) IV  Assessment/ Plan:  Ms. Stephanie [name] is a 79 y.o.  female admitted to [facility] for COVID pneumonia. Patient found to have klebsiella sepsis and had right sided pyelonephritis. She underwent right ureteral stent placed on on 9/18.  Patient had IV contrast exposure on 9/17.   # AKI secondary to ATN Patient has AKI, likely secondary to sepsis/obstructive uropathy/did have IV contrast exposure/hypotension. BASELINE Cr 0.90 in January 2021 Received dialysis 1st treatment 06/28/2020 Creatinine level stays high, even though slightly better today Urine output adequate, 2800 mL for the proceeding 24 hours Patient may require dialysis later this week , will assess  daily  Lab Results  Component Value Date   CREATININE 3.79 (H) 07/04/2020   CREATININE 4.38 (H) 07/03/2020   CREATININE 4.72 (H) 07/02/2020   CREATININE 4.79 (H) 07/02/2020     # Anemia of renal failure Lab Results  Component Value Date   HGB 8.5 (L) 07/04/2020   Maintain the patient on Epogen 10,000 subcutaneous weekly.  # hyponatremia Sodium 132 today.     # obstructive uropathy with pyelonephritis Patient is status post right ureteral stent placement by Dr. Jeffie Pollock on September 18 Urine output adequate, no hematuria   #Small bowel obstruction Status post ex lap on September 30 for takedown of adhesions.  Small bowel was completely stuck and fused to the anterior abdominal wall.  Extensive lysis of adhesions was conducted.  Patient continues on TPN.  NGT discontinued, on clear liquid diet.  Management per GS team       LOS: [place] 10/4/202111:14 AM

## 2020-07-04 NOTE — Progress Notes (Signed)
E Ouma notified HR 120-140, no new orders,5mg  metoprolol given at 0507, ordered to notify at 0600 if no change

## 2020-07-04 NOTE — Progress Notes (Signed)
Nutrition Follow Up Note   DOCUMENTATION CODES:   Not applicable  INTERVENTION:   TPN per pharmacy.  Recommend thiamine 169m daily added to TPN for 3 days.  Recommend adding insulin to TPN  Monitor magnesium, potassium, and phosphorus daily as pt is refeeding   NUTRITION DIAGNOSIS:   Inadequate oral intake related to inability to eat, acute illness (paralytic ileus and partial SBO) as evidenced by NPO status.  GOAL:   Patient will meet greater than or equal to 90% of their needs -met with TPN   MONITOR:   PO intake, Labs, Skin, I & O's, Other (Comment) (TPN)  ASSESSMENT:   79year old female with PMHx of colon cancer s/p colon surgery (no details available in chart), vitamin B12 deficiency, DM, HTN, arthritis, HLD, glaucoma, GERD, asthma admitted with COVID-19, paralytic ileus secondary to electrolytes derangements and partial SBO s/p ex lap and LOA 9/30, also with AKI on CKD requiring HD.   9/18 s/p right ureteral stent placement 9/28- HD treatment  9/30- ex lap with LOA  Pt continues to tolerate TPN at goal rate. Pt is refeeding. Pt continues to have hyperglycemia; pt requiring ~8 units of novoLOG q 4 hrs + 8 units Levemir BID. No new weight since 9/22; will request daily weights. Pt up ~19lbs from her UBW at her last documented weight on 9/22. HD on hold. UOP 2.8L. NGT removed 10/3 and pt initiated on a clear liquid diet. Would recommend additions of insulin and thiamine to TPN. Will hold off on ordering supplements for now as pt with hyperglycemia and on goal TPN.   Medications reviewed and include: vitamin C, epoetin, insulin, protonix, zinc  Labs reviewed: Na 132(L), BUN 94(H), creat 3.79(H), P 2.1(L), Mg 1.7 wnl, alb 1.9(L) Pre-albumin 12.1(L)- 9/29 Triglycerides 121- 10/4 Hgb 8.5(L), Hct 25.4(L) cbgs- 287, 296, 296 x 24 hrs  Diet Order:   Diet Order            Diet clear liquid Room service appropriate? Yes; Fluid consistency: Thin  Diet effective now                 EDUCATION NEEDS:   No education needs have been identified at this time  Skin:  Skin Assessment: Skin Integrity Issues: Skin Integrity Issues:: Stage I Stage I: left buttocks (2cm x 3cm)  Last BM:  10/3- type 7  Height:   Ht Readings from Last 1 Encounters:  06/18/20 _0  (1.676 m)   Weight:   Wt Readings from Last 1 Encounters:  06/22/20 86.8 kg   Ideal Body Weight:  59.1 kg  BMI:  Body mass index is 30.89 kg/m.  Estimated Nutritional Needs:   Kcal:  1900-2100  Protein:  95-105 grams  Fluid:  1.5L/day  CKoleen DistanceMS, RD, LDN Please refer to [facility]for RD and/or RD on-call/weekend/after hours pager

## 2020-07-04 NOTE — Progress Notes (Signed)
E Ouma paged HR 120'150

## 2020-07-04 NOTE — Progress Notes (Signed)
PROGRESS NOTE    Stephanie [name]  YPP:[id number] DOB: 1941/09/08 DOA: 06/17/2020 PCP: Rusty Aus, MD    Brief Narrative:  Stephanie [name] is a 79 year old female with past medical history notable for type 2 diabetes mellitus, rheumatoid arthritis on chronic immunosuppressive therapy, essential hypertension, history of remote colon cancer and Covid-19 viral infection with positive PCR test June 09, 2020 who presented to the ED with with complaint of fever, progressive shortness of breath, and generalized weakness.  In the ED, sodium 133, potassium 3.9, chloride 102, bicarb 18, glucose 344, BUN 38, creatinine 1.57, AST 27, ALT 21, troponin 61, lactic acid 3.1, procalcitonin 17, to BC count 20.4, hemoglobin 11.8, platelet 144.  CT angiogram chest negative for PE but with right hydronephrosis which has progressed since October 2020.  Chest x-ray with no acute cardiopulmonary disease process.  TRH consulted for admission secondary to sepsis from UTI/pyelonephritis with subsequent hydronephrosis.   Assessment & Plan:   Principal Problem:   Sepsis ([place]) Active Problems:   Benign essential hypertension   Chronic cystitis   Rheumatoid arthritis involving multiple sites with positive rheumatoid factor (HCC)   Diabetes mellitus (HCC)   Rheumatoid arthritis ([place])   COVID-19 virus infection   AKI (acute kidney injury) ([place])   Atrial fibrillation with rapid ventricular response (HCC)   Essential hypertension   Pure hypercholesterolemia   Acute pyelonephritis   Hydronephrosis, right   Lactic acid acidosis   Hyperglycemia   Hx of essential hypertension   Immunosuppression due to drug therapy ([place])   Pressure injury of skin   Acquired thrombophilia ([place])   On continuous oral anticoagulation   SBO (small bowel obstruction) (HCC)   Goals of care, counseling/discussion   Palliative care by specialist   CKD (chronic kidney disease) stage 5, GFR less than 15 ml/min (HCC)   Ileus (HCC)    Paroxysmal A-fib (HCC)   Sepsis, present on admission Klebsiella pneumonia septicemia, POA Right pyelonephritis Patient presenting with fever, T-max 103.0 with associated tachycardia, tachypnea with a WBC count of 20 K with a left shift and lactic acid of 3.1.  Underwent right ureter stent placement by urology with frank pus noted to kidney.  And blood cultures x2 positive for Klebsiella pneumonia. WBC count improved from 20K to 7.5.  Completed 10-day course of IV antibiotics with Zosyn followed by ceftriaxone followed by ciprofloxacin.  Blood cultures 06/21/2020 negative. --Continue monitor fever curve, supportive care  Severe right hydronephrosis CT renal notable for progressive right UPJ obstruction in comparison to prior study dated October 2020.  Urology was consulted and right stent placed with findings of frank pus in the kidney by Dr. Jeffie Pollock on 06/18/2020.  Follow-up renal ultrasound 06/20/2020 with resolved hydronephrosis. --Continue Foley catheter per urology, Dr. Jeffie Pollock --Continue strict I's and O's  Acute renal failure Patient presenting above with severe right hydronephrosis with associated sepsis from Klebsiella pneumonia septicemia/pyelonephritis.  Baseline creatinine 0.90 January 2021. Creatinine 1.57 on admission, trended up to a high of 6.33 during admission.  Etiology likely secondary to ATN from severe infection as above. --Nephrology following, appreciate assistance --Underwent placement of temporary HD catheter to right common femoral vein by vascular surgery on 06/27/2020.  --Started on dialysis 9/28; further per nephrology, will assess need daily --Cr 6.33>>4.38>3.79 --Avoid nephrotoxins, renally dose all medications --Closely monitor urinary output --Follow BMP daily  Acute postoperative blood loss anemia Hemoglobin dropped from 8.2 to 7.1 postoperatively on 07/01/2020.  Transfused 1 unit PRBC on 07/01/2020.  Iron panel with  iron 34, TIBC 195, ferritin 605, consistent with  anemia of chronic disease. --Hgb 8.2>7.1>9.1>8.8>8.5 --Goal maintain hemoglobin greater than 8.0 given cardiac history --Repeat CBC in a.m.  Hypokalemia Hypomagnesemia Potassium 4.1 this morning. Magnesium 1.7, will replete magnesium today. --Follow electrolytes daily --On TPN  Small bowel obstruction CT abdomen/pelvis with oral contrast notable for high-grade small bowel obstruction with abrupt transition point central lower abdomen, likely secondary to adhesions.  Underwent exploratory laparotomy on 06/30/2020 by Dr. Dahlia Byes with extensive lysis of adhesions. --NG tube discontinued 10/3 --Continues on TPN; RIJ PICC placed 9/27 --Diet advanced to clear liquid diet today by general surgery on 10/3 --Continue to monitor bowel movements closely, none overnight --Further per general surgery  Paroxysmal atrial fibrillation with RVR --CHA2DS2-VASc score = 5 (HTN, age, T2DM, gender) --Restart home metoprolol 50 mg p.o. twice daily --Discontinued heparin drip 2/2 bleeding surrounding HD catheter --continue to monitor on telemetry  GERD: Protonix 40 mg IV every 24 hours  Pressure injury of buttock, stage I, POA --Continue local wound care, offloading  Hx COVID-19 viral infection Patient fully vaccinated, received Pfizer vaccine on 10/16/2019 and 11/07/2019.  Covid-19 PCR positive on 06/09/2020.  No overt respiratory symptoms.  CT chest without acute pulmonary abnormality.  Oxygenating well on room air.  Completed 5-day course of IV remdesivir. --Discontinued airborne/contact isolation since greater than 21 days since initial diagnosis   DVT prophylaxis: SCDs Code Status: DNR Family Communication: Updated patient spouse [name] at bedside this afternoon  Disposition Plan:  Status is: Inpatient  Remains inpatient appropriate because:Persistent severe electrolyte disturbances, Ongoing active pain requiring inpatient pain management, Ongoing diagnostic testing needed not appropriate for  outpatient work up, Unsafe d/c plan, IV treatments appropriate due to intensity of illness or inability to take PO and Inpatient level of care appropriate due to severity of illness   Dispo: The patient is from: Home              Anticipated d/c is to: SNF              Anticipated d/c date is: > 3 days              Patient currently is not medically stable to d/c.   Consultants:   Cardiology  General surgery  Nephrology  Urology  Palliative care  Procedures:   Right ureteral stent placement, Dr. Jeffie Pollock 9/19  Right femoral HD catheter placement, Dr. Delana Meyer 9/27  Started on HD 9/28  RIJ PICC 9/27  Exploratory laparotomy, Dr Dahlia Byes 06/30/2020  Antimicrobials:   Zosyn 9/19 - 9/23  Ceftriaxone 9/17 - 9/18  Ciprofloxacin 9/23 - 9/26  Azithromycin 9/17 - 9/17  Vancomycin 9/17 - 9/17  Cefepime 9/17 - 9/17    Subjective: Patient seen and examined bedside, resting comfortably.  Denies any flatus, no bowel movements overnight.  Feels her abdomen is slightly more distended.  Continues on clear liquid diet and TPN.  No changes per general surgery today.  Renal function continues to slowly improve, but given her peripheral edema may need further dialysis per nephrology.  No other complaints or concerns at this time. Denies headache, no visual changes, no chest pain, palpitations, no shortness of breath, no fever/chills/night sweats.  No acute events overnight per nursing staff.   Objective: Vitals:   07/04/20 0700 07/04/20 0756 07/04/20 0919 07/04/20 1147  BP: (!) 151/92 (!) 154/97 138/72 131/78  Pulse: 92 (!) 105 77 79  Resp: 20  18 18   Temp: 99 F (37.2 C) 99.1 F (  37.3 C) 99 F (37.2 C) 98 F (36.7 C)  TempSrc: Oral Oral Oral Oral  SpO2: 97% 97% 98% 97%  Weight:      Height:        Intake/Output Summary (Last 24 hours) at 07/04/2020 1322 Last data filed at 07/04/2020 0950 Gross per 24 hour  Intake 1716.32 ml  Output 2800 ml  Net -1083.68 ml   Filed  Weights   06/19/20 0625 06/21/20 0125 06/22/20 0453  Weight: 80.4 kg 82.2 kg 86.8 kg    Examination:  General exam: Appears calm and comfortable, chronically ill in appearance Respiratory system: Clear to auscultation. Respiratory effort normal.  Oxygenating well on room air with SPO2 97% Cardiovascular system: S1 & S2 heard, tachycardic, irregularly irregular rhythm. No JVD, murmurs, rubs, gallops or clicks.  2-3+ pitting edema bilateral lower extremities up to knee Gastrointestinal system: Abdomen is soft, distended, abdominal surgical incision noted with honeycomb in place with small amount of blood present, otherwise, dressing clean/dry/intact. No organomegaly or masses felt.  Faint tinkling bowel sounds noted Central nervous system: Alert and oriented. No focal neurological deficits. Extremities: Symmetric 5 x 5 power. Skin: pressure injury stage 1 buttock Psychiatry: Judgement and insight appear normal. Mood & affect appropriate.     Data Reviewed: I have personally reviewed following labs and imaging studies  CBC: Recent Labs  Lab 06/29/20 0645 06/29/20 0645 07/01/20 0526 07/01/20 1518 07/02/20 0513 07/03/20 0430 07/04/20 0607  WBC 7.5  --  7.6  --  11.7* 11.3* 8.1  NEUTROABS 6.4  --   --   --   --   --  6.7  HGB 8.2*   < > 7.1* 9.1* 8.8* 8.5* 8.5*  HCT 23.2*   < > 20.8* 25.5* 26.7* 25.8* 25.4*  MCV 77.1*  --  80.9  --  85.0 85.7 85.2  PLT 136*  --  145*  --  127* 111* 85*   < > = values in this interval not displayed.   Basic Metabolic Panel: Recent Labs  Lab 06/29/20 0645 06/29/20 1253 06/30/20 0600 07/01/20 0526 07/02/20 0513 07/03/20 0430 07/04/20 0607  NA 135   < > 134* 133*   132* 135   135 138 132*  K 2.5*   < > 3.7 4.1   4.1 4.3   4.3 4.5 4.1  CL 92*   < > 93* 97*   96* 98   98 101 97*  CO2 29   < > 27 27   26 27   27 31 24   GLUCOSE 197*   < > 314* 371*   367* 294*   298* 257* 310*  BUN 62*   < > 62* 74*   74* 86*   85* 91* 94*  CREATININE 4.94*    < > 4.77* 4.50*   4.84* 4.79*   4.72* 4.38* 3.79*  CALCIUM 7.1*   < > 7.5* 7.8*   7.7* 8.5*   8.4* 8.3* 8.3*  MG 1.8   < > 2.1 2.0 2.0 1.9 1.7  PHOS 6.5*  --  5.1* 4.6 3.3  --  2.1*   < > = values in this interval not displayed.   GFR: Estimated Creatinine Clearance: 13.8 mL/min (A) (by C-G formula based on SCr of 3.79 mg/dL (H)). Liver Function Tests: Recent Labs  Lab 06/29/20 0645 06/30/20 0600 07/01/20 0526 07/02/20 0513 07/04/20 0607  AST 14* 14*  --   --  12*  ALT 13 12  --   --  9  ALKPHOS 150* 141*  --   --  111  BILITOT 0.9 0.8  --   --  0.5  PROT 4.6* 4.9*  --   --  4.8*  ALBUMIN 2.1* 2.2* 1.9* 2.0* 1.9*   No results for input(s): LIPASE, AMYLASE in the last 168 hours. No results for input(s): AMMONIA in the last 168 hours. Coagulation Profile: No results for input(s): INR, PROTIME in the last 168 hours. Cardiac Enzymes: No results for input(s): CKTOTAL, CKMB, CKMBINDEX, TROPONINI in the last 168 hours. BNP (last 3 results) No results for input(s): PROBNP in the last 8760 hours. HbA1C: No results for input(s): HGBA1C in the last 72 hours. CBG: Recent Labs  Lab 07/03/20 2014 07/04/20 0049 07/04/20 0426 07/04/20 0739 07/04/20 1145  GLUCAP 342* 287* 296* 296* 296*   Lipid Profile: Recent Labs    07/04/20 0607  TRIG 121   Thyroid Function Tests: No results for input(s): TSH, T4TOTAL, FREET4, T3FREE, THYROIDAB in the last 72 hours. Anemia Panel: Recent Labs    07/02/20 0513  FERRITIN 605*  TIBC 195*  IRON 34   Sepsis Labs: No results for input(s): PROCALCITON, LATICACIDVEN in the last 168 hours.  No results found for this or any previous visit (from the past 240 hour(s)).       Radiology Studies: No results found.      Scheduled Meds:  vitamin C  500 mg Oral Daily   brimonidine  1 drop Both Eyes BID   chlorhexidine  15 mL Mouth Rinse BID   Chlorhexidine Gluconate Cloth  6 each Topical Q0600   epoetin (EPOGEN/PROCRIT) injection   10,000 Units Subcutaneous Weekly   insulin aspart  0-15 Units Subcutaneous Q4H   insulin detemir  8 Units Subcutaneous BID   mouth rinse  15 mL Mouth Rinse BID   metoprolol tartrate  50 mg Oral BID   pantoprazole (PROTONIX) IV  40 mg Intravenous Q24H   sodium chloride flush  10-40 mL Intracatheter Q12H   tamsulosin  0.4 mg Oral Daily   thiamine injection  100 mg Intravenous Once   zinc sulfate  220 mg Oral Daily   Continuous Infusions:  sodium chloride 0 mL (06/27/20 1630)   TPN ADULT (ION) 65 mL/hr at 07/04/20 0500   TPN ADULT (ION)       LOS: 17 days    Time spent: 37 minutes spent on chart review, discussion with nursing staff, consultants, updating family and interview/physical exam; more than 50% of that time was spent in counseling and/or coordination of care.    Burley Kopka J [place] DO [facility] Available via Epic secure chat 7am-7pm After these hours, please refer to coverage provider listed on [email or web address] 07/04/2020, 1:22 PM

## 2020-07-04 NOTE — Care Management Important Message (Signed)
Important Message  Patient Details  Name: Stephanie [name] MRN: [id number] Date of Birth: [birth year]   Medicare Important Message Given:  Yes     Dannette Barbara 07/04/2020, 12:54 PM

## 2020-07-04 NOTE — Progress Notes (Signed)
Progress Note  Patient Name: Stephanie [name] Date of Encounter: 07/04/2020  Primary Cardiologist: Garen Lah   Subjective   She redeveloped Afib with RVR with ventricular rates in the 140s bpm around 04:33 lasting until around 13:39 on 10/4 with spontaneous conversion to sinus rhythm. She did report some palpitations around that time, though continues to report these and is currently in sinus rhythm. She remains on metoprolol for rate control and has been off heparin gtt due to bleeding around the HD catheter. Potassium 4.1, magnesium 1.7, HGB 8.5, PLT 85.   Inpatient Medications    Scheduled Meds: . vitamin C  500 mg Oral Daily  . brimonidine  1 drop Both Eyes BID  . chlorhexidine  15 mL Mouth Rinse BID  . Chlorhexidine Gluconate Cloth  6 each Topical Q0600  . epoetin (EPOGEN/PROCRIT) injection  10,000 Units Subcutaneous Weekly  . insulin aspart  0-15 Units Subcutaneous Q4H  . insulin detemir  8 Units Subcutaneous BID  . mouth rinse  15 mL Mouth Rinse BID  . metoprolol tartrate  50 mg Oral BID  . pantoprazole (PROTONIX) IV  40 mg Intravenous Q24H  . sodium chloride flush  10-40 mL Intracatheter Q12H  . tamsulosin  0.4 mg Oral Daily  . zinc sulfate  220 mg Oral Daily   Continuous Infusions: . sodium chloride 0 mL (06/27/20 1630)  . magnesium sulfate bolus IVPB    . TPN ADULT (ION) 65 mL/hr at 07/04/20 0500  . TPN ADULT (ION)     PRN Meds: sodium chloride, acetaminophen, albuterol, guaiFENesin-dextromethorphan, heparin, labetalol, morphine injection, ondansetron (ZOFRAN) IV   Vital Signs    Vitals:   07/04/20 0700 07/04/20 0756 07/04/20 0919 07/04/20 1147  BP: (!) 151/92 (!) 154/97 138/72 131/78  Pulse: 92 (!) 105 77 79  Resp: 20  18 18   Temp: 99 F (37.2 C) 99.1 F (37.3 C) 99 F (37.2 C) 98 F (36.7 C)  TempSrc: Oral Oral Oral Oral  SpO2: 97% 97% 98% 97%  Weight:      Height:        Intake/Output Summary (Last 24 hours) at 07/04/2020 1448 Last data filed at  07/04/2020 1345 Gross per 24 hour  Intake 1956.32 ml  Output 2800 ml  Net -843.68 ml   Filed Weights   06/19/20 0625 06/21/20 0125 06/22/20 0453  Weight: 80.4 kg 82.2 kg 86.8 kg    Telemetry    Afib with RVR with ventricular rates in the 140s bpm beginning around 04:33 lasting until approximately 13:39 on 10/4, currently maintaining sinus rhythm - Personally Reviewed  ECG    No new tracings - Personally Reviewed  Physical Exam   GEN: No acute distress.   Neck: No JVD. Cardiac: RRR, no murmurs, rubs, or gallops.  Respiratory: Diminished breath sounds bilaterally with faint wheezing.  GI: Soft, non-distended.   MS: 2+ pitting edema along the lower extremities to the knees (likely 3rd spacing from hypoalbuminemia with an albumin of 1.9); No deformity. Neuro:  Alert and oriented x 3; Nonfocal.  Psych: Normal affect.  Labs    Chemistry Recent Labs  Lab 06/29/20 0645 06/29/20 1253 06/30/20 0600 06/30/20 0600 07/01/20 0526 07/01/20 0526 07/02/20 0513 07/03/20 0430 07/04/20 0607  NA 135   < > 134*   < > 133*  132*   < > 135  135 138 132*  K 2.5*   < > 3.7   < > 4.1  4.1   < > 4.3  4.3 4.5 4.1  CL 92*   < > 93*   < > 97*  96*   < > 98  98 101 97*  CO2 29   < > 27   < > 27  26   < > 27  27 31 24   GLUCOSE 197*   < > 314*   < > 371*  367*   < > 294*  298* 257* 310*  BUN 62*   < > 62*   < > 74*  74*   < > 86*  85* 91* 94*  CREATININE 4.94*   < > 4.77*   < > 4.50*  4.84*   < > 4.79*  4.72* 4.38* 3.79*  CALCIUM 7.1*   < > 7.5*   < > 7.8*  7.7*   < > 8.5*  8.4* 8.3* 8.3*  PROT 4.6*  --  4.9*  --   --   --   --   --  4.8*  ALBUMIN 2.1*  --  2.2*   < > 1.9*  --  2.0*  --  1.9*  AST 14*  --  14*  --   --   --   --   --  12*  ALT 13  --  12  --   --   --   --   --  9  ALKPHOS 150*  --  141*  --   --   --   --   --  111  BILITOT 0.9  --  0.8  --   --   --   --   --  0.5  GFRNONAA 8*   < > 8*   < > 9*  8*   < > 8*  8* 9* 11*  GFRAA 9*   < > 10*   < > 10*  9*   <  > 9*  10* 11* 13*  ANIONGAP 14   < > 14   < > 9  10   < > 10  10 6 11    < > = values in this interval not displayed.     Hematology Recent Labs  Lab 07/02/20 0513 07/03/20 0430 07/04/20 0607  WBC 11.7* 11.3* 8.1  RBC 3.14* 3.01* 2.98*  HGB 8.8* 8.5* 8.5*  HCT 26.7* 25.8* 25.4*  MCV 85.0 85.7 85.2  MCH 28.0 28.2 28.5  MCHC 33.0 32.9 33.5  RDW 16.4* 16.8* 17.2*  PLT 127* 111* 85*    Cardiac EnzymesNo results for input(s): TROPONINI in the last 168 hours. No results for input(s): TROPIPOC in the last 168 hours.   BNPNo results for input(s): BNP, PROBNP in the last 168 hours.   DDimer No results for input(s): DDIMER in the last 168 hours.   Radiology    No results found.  Cardiac Studies   2D echo 06/2020: 1. Left ventricular ejection fraction, by estimation, is 50 to 55%. The  left ventricle has low normal function. The left ventricle has no regional  wall motion abnormalities. There is mild left ventricular hypertrophy.  Indeterminate diastolic filling due  to E-A fusion.  2. Right ventricular systolic function is normal. The right ventricular  size is normal.  3. The mitral valve is normal in structure. No evidence of mitral valve  regurgitation. No evidence of mitral stenosis.  4. The aortic valve is normal in structure. Aortic valve regurgitation is  not visualized. No aortic stenosis is present.  Patient Profile  79 y.o. female with history of DM, HTN, HLD, , RA, chronic urinary retention requiring self-catheterization who presented with fevers and chills. She was diagnosed with sepsis likely from urinary origin, underwent right renal stent placement due to obstruction. Earlier this admission, she was being seen for new onset atrial fibrillation with rapid ventricular response. She subsequently converted to sinus rhythm and was followed at a distance. She redeveloped Afib with RVR on the morning of 10/4 and has subsequently converted to sinus.    Assessment & Plan    1. PAF: -In the context of her acute illness -Redeveloped Afib with RVR this morning lasting from 04:33 to 13:39, currently maintaining sinus rhythm -Continue metoprolol 50 mg bid for rate control -CHADS2VASc 5 -Not on DOAC/heparin gtt due to bleeding around HD catheter, anemia, and thrombocytopenia resume when/if able -Potassium 4.1, magnesium 1.7 which will be repleted to goal 2.0 per IM -TSH normal earlier this admission   For questions or updates, please contact [place] Please consult [email or web address] for contact info under Cardiology/STEMI.    Signed, Christell Faith, PA-C [place] Pager: [phone] 07/04/2020, 2:48 PM

## 2020-07-04 NOTE — Progress Notes (Signed)
Occupational Therapy Treatment Patient Details Name: Stephanie [name] MRN: [id number] DOB: 08-08-1941 Today's Date: 07/04/2020    History of present illness Stephanie [name] is a 79 y/o F w/ PMH: COVID infection, DM, RA, HTN, remote colon CA, who comes to [facility] on 9/17. Patient found to have klebsiella sepsis and had right sided pyelonephritis. She underwent right ureteral stent placed on on 9/18. Pt with acute on chronic renal failure. Temporary fem cath placed 9/27. Pt also with SBO requiring TPN so R IJ placed 9/27 as well. Pt s/p exploratory lap and lysis of adhesions 9/30 with continue at transfer order in place.   OT comments  Pt seen for OT treatment this date to f/u re: strengthening as it applies to self care ADLs/ADL mobility. OT engages pt in below-listed bed-level exercise. Pt tolerates well, but does demo decreased tolerance as evidenced by requiring down-grade, breaking up exercises into shorter sets. OT spends time with pt's spouse educating re: rationale for exercises chosen (ex: contralateral reach to work obliques, but be gentle on post op area in middle of abdomen), safety, pace, form, and technique. Pt's spouse with good carryover and agreeable to encouraging pt bed-level exercise outside of therapy time to promote progression in prep for higher level mobilization (once temporary femcath removed). Overall pt continues to be agreeable and put forth effort to increase strength, but continues to be grossly weak with low tolerance. Continues to benefit from skilled OT in acute setting.    Follow Up Recommendations  SNF;Supervision/Assistance - 24 hour    Equipment Recommendations  Other (comment) (defer to next venue of care)    Recommendations for Other Services      Precautions / Restrictions Precautions Precautions: Fall Restrictions Weight Bearing Restrictions: No Other Position/Activity Restrictions: Pt with temporary femcath to R LE, no sitting up. R IJ as well.        Mobility Bed Mobility               General bed mobility comments: not performed as pt with R temporary fem-cath  Transfers                 General transfer comment: Not performed due to R temporary fem-cath    Balance                                           ADL either performed or assessed with clinical judgement   ADL                                               Vision Patient Visual Report: No change from baseline     Perception     Praxis      Cognition Arousal/Alertness: Awake/alert Behavior During Therapy: WFL for tasks assessed/performed Overall Cognitive Status: Within Functional Limits for tasks assessed                                 General Comments: Patient is HOH and requires some increased processing time        Exercises Other Exercises Other Exercises: OT engages pt in supine therex: 2 sets x5 reps straight arm rasises, 2 sets x5 reps contralateral reaching, 1  set x12 reps glute squeezes, 2 sets x5 reps FWD punches alternating. Pt continues to present with limited tolerance (somewhat decreased for UB therex versus last OT tx-increased rest breaks, less reps per set). Pt requires ~2 mins between sets. OT engages pt's husband in education re: rationale for exercises chosen and technique/form. Pt's spouse with good understanding and agreeable to encouraging more pt participation outside of therapy time to help progress.   Shoulder Instructions       General Comments      Pertinent Vitals/ Pain       Pain Assessment: No/denies pain  Home Living                                          Prior Functioning/Environment              Frequency  Min 1X/week        Progress Toward Goals  OT Goals(current goals can now be found in the care plan section)  Progress towards OT goals: OT to reassess next treatment (pt does not seem to be completing exercise  outside of therapy time, but spouse agreeable to helping encourage. Ed re: pace/form provided.)  Acute Rehab OT Goals Patient Stated Goal: go home OT Goal Formulation: With patient Time For Goal Achievement: 07/07/20 Potential to Achieve Goals: [place] Discharge plan remains appropriate    Co-evaluation                 AM-PAC OT "6 Clicks" Daily Activity     Outcome Measure   Help from another person eating meals?: A Little Help from another person taking care of personal grooming?: A Lot Help from another person toileting, which includes using toliet, bedpan, or urinal?: Total Help from another person bathing (including washing, rinsing, drying)?: Total Help from another person to put on and taking off regular upper body clothing?: A Lot Help from another person to put on and taking off regular lower body clothing?: Total 6 Click Score: 10    End of Session    OT Visit Diagnosis: Unsteadiness on feet (R26.81);Muscle weakness (generalized) (M62.81)   Activity Tolerance Patient limited by fatigue   Patient Left in bed;with call bell/phone within reach;with bed alarm set;with family/visitor present (spouse present)   Nurse Communication          Time: 1443-1540 OT Time Calculation (min): 26 min  Charges: OT General Charges $OT Visit: 1 Visit OT Treatments $Self Care/Home Management : 8-22 mins $Therapeutic Exercise: 8-22 mins  Gerrianne Scale, [place], OTR/L ascom [phone] 07/04/20, 5:01 PM

## 2020-07-05 DIAGNOSIS — A419 Sepsis, unspecified organism: Secondary | ICD-10-CM | POA: Diagnosis not present

## 2020-07-05 DIAGNOSIS — D6869 Other thrombophilia: Secondary | ICD-10-CM | POA: Diagnosis not present

## 2020-07-05 DIAGNOSIS — N10 Acute tubulo-interstitial nephritis: Secondary | ICD-10-CM | POA: Diagnosis not present

## 2020-07-05 DIAGNOSIS — U071 COVID-19: Secondary | ICD-10-CM | POA: Diagnosis not present

## 2020-07-05 LAB — GLUCOSE, CAPILLARY
Glucose-Capillary: 145 mg/dL — ABNORMAL HIGH (ref 70–99)
Glucose-Capillary: 156 mg/dL — ABNORMAL HIGH (ref 70–99)
Glucose-Capillary: 163 mg/dL — ABNORMAL HIGH (ref 70–99)
Glucose-Capillary: 192 mg/dL — ABNORMAL HIGH (ref 70–99)
Glucose-Capillary: 205 mg/dL — ABNORMAL HIGH (ref 70–99)
Glucose-Capillary: 220 mg/dL — ABNORMAL HIGH (ref 70–99)
Glucose-Capillary: 235 mg/dL — ABNORMAL HIGH (ref 70–99)

## 2020-07-05 LAB — CBC
HCT: 23.8 % — ABNORMAL LOW (ref 36.0–46.0)
Hemoglobin: 8.1 g/dL — ABNORMAL LOW (ref 12.0–15.0)
MCH: 29 pg (ref 26.0–34.0)
MCHC: 34 g/dL (ref 30.0–36.0)
MCV: 85.3 fL (ref 80.0–100.0)
Platelets: 83 10*3/uL — ABNORMAL LOW (ref 150–400)
RBC: 2.79 MIL/uL — ABNORMAL LOW (ref 3.87–5.11)
RDW: 17.2 % — ABNORMAL HIGH (ref 11.5–15.5)
WBC: 8.4 10*3/uL (ref 4.0–10.5)
nRBC: 0 % (ref 0.0–0.2)

## 2020-07-05 LAB — BASIC METABOLIC PANEL
Anion gap: 9 (ref 5–15)
BUN: 99 mg/dL — ABNORMAL HIGH (ref 8–23)
CO2: 25 mmol/L (ref 22–32)
Calcium: 8.2 mg/dL — ABNORMAL LOW (ref 8.9–10.3)
Chloride: 100 mmol/L (ref 98–111)
Creatinine, Ser: 3.35 mg/dL — ABNORMAL HIGH (ref 0.44–1.00)
GFR calc Af Amer: 15 mL/min — ABNORMAL LOW (ref >60)
GFR calc non Af Amer: 13 mL/min — ABNORMAL LOW (ref >60)
Glucose, Bld: 167 mg/dL — ABNORMAL HIGH (ref 70–99)
Potassium: 4 mmol/L (ref 3.5–5.1)
Sodium: 134 mmol/L — ABNORMAL LOW (ref 135–145)

## 2020-07-05 LAB — PHOSPHORUS: Phosphorus: 2 mg/dL — ABNORMAL LOW (ref 2.5–4.6)

## 2020-07-05 LAB — MAGNESIUM: Magnesium: 2.1 mg/dL (ref 1.7–2.4)

## 2020-07-05 MED ORDER — TRACE MINERALS CU-MN-SE-ZN 300-55-60-3000 MCG/ML IV SOLN
INTRAVENOUS | Status: AC
  Filled 2020-07-05: qty 337.93

## 2020-07-05 MED ORDER — ENSURE ENLIVE PO LIQD
237.0000 mL | Freq: Two times a day (BID) | ORAL | Status: DC
  Administered 2020-07-05 – 2020-07-07 (×5): 237 mL via ORAL

## 2020-07-05 MED ORDER — AMLODIPINE BESYLATE 5 MG PO TABS
5.0000 mg | ORAL_TABLET | Freq: Every day | ORAL | Status: DC
  Administered 2020-07-05: 5 mg via ORAL
  Filled 2020-07-05: qty 1

## 2020-07-05 NOTE — Progress Notes (Signed)
[place] Hospital Day(s): 18.   Post op day(s): 5 Days Post-Op.   Interval History:  Patient seen and examined no acute events or new complaints overnight.  Patient reports she is feeling better and "her bowels are working." No abdominal pain, nausea, emesis. Distension improved Leukocytosis remains resolved, WBC 8.4k Hgb stable but low, 8.1, suspect this is primarily dilutional  Renal function making improvements, sCr - 3.35, UO - 1.9L Mild hypophosphatemia to 2.0, otherwise no significant electrolyte derangements  Followed by cardiology for atrial fibrillation, heparin held for bleeding around HD catheter, rate controlled with PO metoprolol On CLD, tolerating well She reports she has had 4 bowel movements in the last 24 hours Remained on full rate TPN Working with therapies; recommending SNF   Vital signs in last 24 hours: [min-max] current  Temp:  [97.7 F (36.5 C)-99.1 F (37.3 C)] 98.8 F (37.1 C) (10/05 0433) Pulse Rate:  [77-105] 83 (10/05 0433) Resp:  [18-20] 20 (10/05 0433) BP: (131-185)/(60-97) 168/64 (10/05 0433) SpO2:  [95 %-98 %] 95 % (10/05 0433)     Height: 5\' 6"  (167.6 cm) Weight: 86.8 kg BMI (Calculated): 30.91   Intake/Output last 2 shifts:  10/04 0701 - 10/05 0700 In: 1832.7 [P.O.:480; I.V.:1323.9; IV Piggyback:28.8] Out: 1900 [Urine:1900]   Physical Exam:  Constitutional: alert, cooperative and no distress  HENT: normocephalic without obvious abnormality Eyes: PERRL, EOM's grossly intact and symmetric  Respiratory: breathing non-labored at rest  Cardiovascular: regular rate and sinus rhythm  Gastrointestinal:Soft,no appreciable soreness, distension appears to be improved but remains somewhat tympanic throughout, certainly no rebound/guarding/peritonitis Integumentary: Laparotomy incision is CDI with staples, no erythema Musculoskeletal: Marked edema, +2, to bilateral LE   Labs:  CBC Latest Ref Rng &  Units 07/05/2020 07/04/2020 07/03/2020  WBC 4.0 - 10.5 K/uL 8.4 8.1 11.3(H)  Hemoglobin 12.0 - 15.0 g/dL 8.1(L) 8.5(L) 8.5(L)  Hematocrit 36 - 46 % 23.8(L) 25.4(L) 25.8(L)  Platelets 150 - 400 K/uL 83(L) 85(L) 111(L)   CMP Latest Ref Rng & Units 07/05/2020 07/04/2020 07/03/2020  Glucose 70 - 99 mg/dL 167(H) 310(H) 257(H)  BUN 8 - 23 mg/dL 99(H) 94(H) 91(H)  Creatinine 0.44 - 1.00 mg/dL 3.35(H) 3.79(H) 4.38(H)  Sodium 135 - 145 mmol/L 134(L) 132(L) 138  Potassium 3.5 - 5.1 mmol/L 4.0 4.1 4.5  Chloride 98 - 111 mmol/L 100 97(L) 101  CO2 22 - 32 mmol/L 25 24 31   Calcium 8.9 - 10.3 mg/dL 8.2(L) 8.3(L) 8.3(L)  Total Protein 6.5 - 8.1 g/dL - 4.8(L) -  Total Bilirubin 0.3 - 1.2 mg/dL - 0.5 -  Alkaline Phos 38 - 126 U/L - 111 -  AST 15 - 41 U/L - 12(L) -  ALT 0 - 44 U/L - 9 -     Imaging studies: No new pertinent imaging studies   Assessment/Plan: 79 y.o. female with more persistent bowel functon 5 Days Post-Op s/p exploratory laparotomy and lysis of adhesionsfor partial small bowel obstruction secondary to post-surgical adhesions, complicated by pertinent comorbidities includingdebilitation, numerousmedicalconditionsnow with ESRD.   - Will advance to full liquid diet   - Continue TPN; we should be able to 1/2 rate today;   - monitor electrolytes             - Monitor abdominal examination; on-going bowel function              - Pain control prn; antiemetics prn             -  appreciate nephrology and cardiology consultations and recommendations - Mobilization encouraged; PT following --> recommendations for SNF             - Further management per primary team; we will follow   All of the above findings and recommendations were discussed with the patient, and the medical team, and all of patient's questions were answered to her expressed satisfaction.  -- Edison Simon, PA-C [facility] 07/05/2020, 7:10 AM [phone] M-F: 7am - 4pm

## 2020-07-05 NOTE — Progress Notes (Signed)
    She is maintaining sinus rhythm on metoprolol. Can use IV diltiazem if needed for recurrent Afib. She remains off anticoagulation for bleeding around her HD catheter, anemia, and thrombocytopenia resume when/if able.

## 2020-07-05 NOTE — Progress Notes (Signed)
[facility]  ROUNDING NOTE   Subjective:   Patient resting in bed, denies shortness of breath, however her bilateral lower extremity edema is worsening.  She denies nausea or vomiting.  She is on TPN 65 mL/hr. urine output 1900 ml for the preceding 24 hours.  10/04 0701 - 10/05 0700 In: 1832.7 [P.O.:480; I.V.:1323.9; IV Piggyback:28.8] Out: 1900 [Urine:1900]   Objective:  Vital signs in last 24 hours:  Temp:  [97.7 F (36.5 C)-98.9 F (37.2 C)] 98.9 F (37.2 C) (10/05 1200) Pulse Rate:  [79-89] 86 (10/05 1200) Resp:  [18-20] 18 (10/05 1200) BP: (145-185)/(60-83) 145/66 (10/05 1200) SpO2:  [95 %-97 %] 96 % (10/05 1200)  Weight change:  Filed Weights   06/19/20 0625 06/21/20 0125 06/22/20 0453  Weight: 80.4 kg 82.2 kg 86.8 kg    Intake/Output: I/O last 3 completed shifts: In: 2510 [P.O.:480; I.V.:2001.2; IV Piggyback:28.8] Out: 3400 [Urine:3400]   Intake/Output this shift:  Total I/O In: -  Out: 1100 [Urine:1100]  Physical Exam: General:  Resting in bed in no acute distress  Head:  Normocephalic, atraumatic  Lungs:   Normal and symmetric effort, lungs with rhonchi bilaterally  Heart:  S1-S2 no rubs or gallops  Abdomen:   Mildly distended, no tenderness  Extremities:  2+ peripheral edema.  Neurologic:  Awake, lethargic, answers questions appropriately  Skin: No new rashes or lesions  Access: Rt fem temp dialysis  catheter placed on 7/49/44    Basic Metabolic Panel: Recent Labs  Lab 06/30/20 0600 06/30/20 0600 07/01/20 0526 07/01/20 0526 07/02/20 0513 07/02/20 0513 07/03/20 0430 07/04/20 0607 07/05/20 0449  NA 134*   < > 133*  132*  --  135  135  --  138 132* 134*  K 3.7   < > 4.1  4.1  --  4.3  4.3  --  4.5 4.1 4.0  CL 93*   < > 97*  96*  --  98  98  --  101 97* 100  CO2 27   < > 27  26  --  27  27  --  31 24 25   GLUCOSE 314*   < > 371*  367*  --  294*  298*  --  257* 310* 167*  BUN 62*   < > 74*  74*  --  86*  85*  --  91*  94* 99*  CREATININE 4.77*   < > 4.50*  4.84*  --  4.79*  4.72*  --  4.38* 3.79* 3.35*  CALCIUM 7.5*   < > 7.8*  7.7*   < > 8.5*  8.4*   < > 8.3* 8.3* 8.2*  MG 2.1   < > 2.0  --  2.0  --  1.9 1.7 2.1  PHOS 5.1*  --  4.6  --  3.3  --   --  2.1* 2.0*   < > = values in this interval not displayed.    Liver Function Tests: Recent Labs  Lab 06/29/20 0645 06/30/20 0600 07/01/20 0526 07/02/20 0513 07/04/20 0607  AST 14* 14*  --   --  12*  ALT 13 12  --   --  9  ALKPHOS 150* 141*  --   --  111  BILITOT 0.9 0.8  --   --  0.5  PROT 4.6* 4.9*  --   --  4.8*  ALBUMIN 2.1* 2.2* 1.9* 2.0* 1.9*   No results for input(s): LIPASE, AMYLASE in the last 168 hours. No results  for input(s): AMMONIA in the last 168 hours.  CBC: Recent Labs  Lab 06/29/20 0645 06/29/20 0645 07/01/20 0526 07/01/20 0526 07/01/20 1518 07/02/20 0513 07/03/20 0430 07/04/20 0607 07/05/20 0449  WBC 7.5   < > 7.6  --   --  11.7* 11.3* 8.1 8.4  NEUTROABS 6.4  --   --   --   --   --   --  6.7  --   HGB 8.2*   < > 7.1*   < > 9.1* 8.8* 8.5* 8.5* 8.1*  HCT 23.2*   < > 20.8*   < > 25.5* 26.7* 25.8* 25.4* 23.8*  MCV 77.1*   < > 80.9  --   --  85.0 85.7 85.2 85.3  PLT 136*   < > 145*  --   --  127* 111* 85* 83*   < > = values in this interval not displayed.    Cardiac Enzymes: No results for input(s): CKTOTAL, CKMB, CKMBINDEX, TROPONINI in the last 168 hours.  BNP: Invalid input(s): POCBNP  CBG: Recent Labs  Lab 07/04/20 1954 07/05/20 0031 07/05/20 0434 07/05/20 0738 07/05/20 1158  GLUCAP 260* 205* 163* 156* 220*    Microbiology: Results for orders placed or performed during the hospital encounter of 06/17/20  Blood culture (routine x 2)     Status: Abnormal   Collection Time: 06/17/20  3:57 AM   Specimen: BLOOD  Result Value Ref Range Status   Specimen Description   Final    BLOOD LEFT HAND Performed at [facility], [address] [place], [place] [address]    Special Requests   Final     BOTTLES DRAWN AEROBIC AND ANAEROBIC Blood Culture adequate volume Performed at [facility], [address] [place], [place] [address]    Culture  Setup Time   Final    Organism ID to follow GRAM NEGATIVE RODS IN BOTH AEROBIC AND ANAEROBIC BOTTLES CRITICAL RESULT CALLED TO, READ BACK BY AND VERIFIED WITH: AMY THOMPSON AT 1534 ON 06/17/20 [place] Performed at [facility], [address] [place], [place] [address]    Culture KLEBSIELLA PNEUMONIAE (A)  Final   Report Status 06/19/2020 FINAL  Final   Organism ID, Bacteria KLEBSIELLA PNEUMONIAE  Final      Susceptibility   Klebsiella pneumoniae - MIC*    AMPICILLIN RESISTANT Resistant     CEFAZOLIN <=4 SENSITIVE Sensitive     CEFEPIME <=0.12 SENSITIVE Sensitive     CEFTAZIDIME <=1 SENSITIVE Sensitive     CEFTRIAXONE <=0.25 SENSITIVE Sensitive     CIPROFLOXACIN <=0.25 SENSITIVE Sensitive     GENTAMICIN <=1 SENSITIVE Sensitive     IMIPENEM <=0.25 SENSITIVE Sensitive     TRIMETH/SULFA <=20 SENSITIVE Sensitive     AMPICILLIN/SULBACTAM 4 SENSITIVE Sensitive     PIP/TAZO <=4 SENSITIVE Sensitive     * KLEBSIELLA PNEUMONIAE  Blood culture (routine x 2)     Status: Abnormal   Collection Time: 06/17/20  3:57 AM   Specimen: BLOOD  Result Value Ref Range Status   Specimen Description   Final    BLOOD RIGHT [facility] Performed at [facility], [address] [place], [place] [address]    Special Requests   Final    BOTTLES DRAWN AEROBIC AND ANAEROBIC Blood Culture adequate volume Performed at [facility], [address] [place], [place] [address]    Culture  Setup Time   Final    GRAM NEGATIVE RODS IN BOTH AEROBIC AND ANAEROBIC  BOTTLES CRITICAL VALUE NOTED.  VALUE IS CONSISTENT WITH PREVIOUSLY REPORTED AND CALLED VALUE. Performed at [facility], [address] [place], [place] [address]    Culture (A)  Final    KLEBSIELLA PNEUMONIAE SUSCEPTIBILITIES PERFORMED ON PREVIOUS CULTURE WITHIN  THE LAST 5 DAYS. Performed at [facility], [place] [address] [place], [place] [address]    Report Status 06/19/2020 FINAL  Final  Blood Culture ID Panel (Reflexed)     Status: Abnormal   Collection Time: 06/17/20  3:57 AM  Result Value Ref Range Status   Enterococcus faecalis NOT DETECTED NOT DETECTED Final   Enterococcus Faecium NOT DETECTED NOT DETECTED Final   Listeria monocytogenes NOT DETECTED NOT DETECTED Final   Staphylococcus species NOT DETECTED NOT DETECTED Final   Staphylococcus aureus (BCID) NOT DETECTED NOT DETECTED Final   Staphylococcus epidermidis NOT DETECTED NOT DETECTED Final   Staphylococcus lugdunensis NOT DETECTED NOT DETECTED Final   Streptococcus species NOT DETECTED NOT DETECTED Final   Streptococcus agalactiae NOT DETECTED NOT DETECTED Final   Streptococcus pneumoniae NOT DETECTED NOT DETECTED Final   Streptococcus pyogenes NOT DETECTED NOT DETECTED Final   A.calcoaceticus-baumannii NOT DETECTED NOT DETECTED Final   Bacteroides fragilis NOT DETECTED NOT DETECTED Final   Enterobacterales DETECTED (A) NOT DETECTED Final    Comment: Enterobacterales represent a large order of gram negative bacteria, not a single organism. CRITICAL RESULT CALLED TO, READ BACK BY AND VERIFIED WITH: AMY THOMPSON AT 6295 ON 06/17/20 SNG    Enterobacter cloacae complex NOT DETECTED NOT DETECTED Final   Escherichia coli NOT DETECTED NOT DETECTED Final   Klebsiella aerogenes NOT DETECTED NOT DETECTED Final   Klebsiella oxytoca NOT DETECTED NOT DETECTED Final   Klebsiella pneumoniae DETECTED (A) NOT DETECTED Final    Comment: CRITICAL RESULT CALLED TO, READ BACK BY AND VERIFIED WITH: AMY THOMPSON RN AT 2841 ON 06/17/20 SNG    Proteus species NOT DETECTED NOT DETECTED Final   Salmonella species NOT DETECTED NOT DETECTED Final   Serratia marcescens NOT DETECTED NOT DETECTED Final   Haemophilus influenzae NOT DETECTED NOT DETECTED Final   Neisseria meningitidis NOT DETECTED NOT  DETECTED Final   Pseudomonas aeruginosa NOT DETECTED NOT DETECTED Final   Stenotrophomonas maltophilia NOT DETECTED NOT DETECTED Final   Candida albicans NOT DETECTED NOT DETECTED Final   Candida auris NOT DETECTED NOT DETECTED Final   Candida glabrata NOT DETECTED NOT DETECTED Final   Candida krusei NOT DETECTED NOT DETECTED Final   Candida parapsilosis NOT DETECTED NOT DETECTED Final   Candida tropicalis NOT DETECTED NOT DETECTED Final   Cryptococcus neoformans/gattii NOT DETECTED NOT DETECTED Final   CTX-M ESBL NOT DETECTED NOT DETECTED Final   Carbapenem resistance IMP NOT DETECTED NOT DETECTED Final   Carbapenem resistance KPC NOT DETECTED NOT DETECTED Final   Carbapenem resistance NDM NOT DETECTED NOT DETECTED Final   Carbapenem resist OXA 48 LIKE NOT DETECTED NOT DETECTED Final   Carbapenem resistance VIM NOT DETECTED NOT DETECTED Final    Comment: Performed at [facility], [address] [place], [place] [address]  Urine Culture     Status: Abnormal   Collection Time: 06/17/20 11:01 AM   Specimen: Urine, Random  Result Value Ref Range Status   Specimen Description   Final    URINE, RANDOM Performed at [facility], [address] [place], [place] [address]    Special Requests   Final    NONE Performed at [facility], [address] [place], [place] [address]    Culture >=100,000 COLONIES/mL KLEBSIELLA PNEUMONIAE (A)  Final   Report Status 06/19/2020 FINAL  Final   Organism ID, Bacteria KLEBSIELLA PNEUMONIAE (A)  Final      Susceptibility   Klebsiella pneumoniae - MIC*    AMPICILLIN RESISTANT Resistant     CEFAZOLIN <=4 SENSITIVE Sensitive     CEFTRIAXONE <=0.25 SENSITIVE Sensitive     CIPROFLOXACIN <=0.25 SENSITIVE Sensitive     GENTAMICIN <=1 SENSITIVE Sensitive     IMIPENEM <=0.25 SENSITIVE Sensitive     NITROFURANTOIN 32 SENSITIVE Sensitive     TRIMETH/SULFA <=20 SENSITIVE Sensitive     AMPICILLIN/SULBACTAM 4 SENSITIVE Sensitive      PIP/TAZO <=4 SENSITIVE Sensitive     * >=100,000 COLONIES/mL KLEBSIELLA PNEUMONIAE  Culture, blood (Routine X 2) w Reflex to ID Panel     Status: None   Collection Time: 06/21/20 11:07 AM   Specimen: BLOOD  Result Value Ref Range Status   Specimen Description BLOOD RIGHT [facility]  Final   Special Requests   Final    BOTTLES DRAWN AEROBIC AND ANAEROBIC Blood Culture adequate volume   Culture   Final    NO GROWTH 5 DAYS Performed at [facility], [address] [place], [place] [address]    Report Status 06/26/2020 FINAL  Final  Culture, blood (Routine X 2) w Reflex to ID Panel     Status: None   Collection Time: 06/21/20 11:09 AM   Specimen: BLOOD  Result Value Ref Range Status   Specimen Description BLOOD [place]  Final   Special Requests   Final    BOTTLES DRAWN AEROBIC AND ANAEROBIC Blood Culture adequate volume   Culture   Final    NO GROWTH 5 DAYS Performed at [facility], [address] [place], [place] [address]    Report Status 06/26/2020 FINAL  Final    Coagulation Studies: No results for input(s): LABPROT, INR in the last 72 hours.  Urinalysis: No results for input(s): COLORURINE, LABSPEC, PHURINE, GLUCOSEU, HGBUR, BILIRUBINUR, KETONESUR, PROTEINUR, UROBILINOGEN, NITRITE, LEUKOCYTESUR in the last 72 hours.  Invalid input(s): APPERANCEUR    Imaging: No results found.   Medications:   . sodium chloride 0 mL (06/27/20 1630)  . TPN ADULT (ION) 65 mL/hr at 07/05/20 0400  . TPN ADULT (ION)     . amLODipine  5 mg Oral Daily  . vitamin C  500 mg Oral Daily  . brimonidine  1 drop Both Eyes BID  . chlorhexidine  15 mL Mouth Rinse BID  . Chlorhexidine Gluconate Cloth  6 each Topical Q0600  . epoetin (EPOGEN/PROCRIT) injection  10,000 Units Subcutaneous Weekly  . feeding supplement (ENSURE ENLIVE)  237 mL Oral BID BM  . insulin aspart  0-15 Units Subcutaneous Q4H  . insulin detemir  8 Units Subcutaneous BID  . mouth rinse  15 mL Mouth Rinse  BID  . metoprolol tartrate  50 mg Oral BID  . pantoprazole (PROTONIX) IV  40 mg Intravenous Q24H  . sodium chloride flush  10-40 mL Intracatheter Q12H  . tamsulosin  0.4 mg Oral Daily  . zinc sulfate  220 mg Oral Daily   sodium chloride, acetaminophen, albuterol, guaiFENesin-dextromethorphan, heparin, labetalol, morphine injection, ondansetron (ZOFRAN) IV  Assessment/ Plan:  Ms. Stephanie [name] is a 79 y.o.  female admitted to [facility] for COVID pneumonia. Patient found to have klebsiella sepsis and had right sided pyelonephritis. She underwent right ureteral stent placed on on 9/18.  Patient had IV contrast exposure on 9/17.   # AKI secondary to ATN Patient has AKI, likely secondary to sepsis/obstructive uropathy/did have IV contrast exposure/hypotension. BASELINE Cr 0.90 in January 2021 Received dialysis 1st treatment 06/28/2020 Creatinine level 3.35 today Urine output 1900 ml   Lab Results  Component Value Date   CREATININE 3.35 (H) 07/05/2020   CREATININE 3.79 (H) 07/04/2020   CREATININE 4.38 (H) 07/03/2020     # Anemia of renal failure Lab Results  Component Value Date   HGB 8.1 (L) 07/05/2020   Maintain the patient on Epogen 10,000 subcutaneous weekly.  # hyponatremia Sodium 134 today,improving     # obstructive uropathy with pyelonephritis Patient is status post right ureteral stent placement by Dr. Jeffie Pollock on September 18 F/C draining dark amber urine, no blood clots noted.   #Small bowel obstruction Status post ex lap on September 30 for takedown of adhesions.  Small bowel was completely stuck and fused to the anterior abdominal wall.  Extensive lysis of adhesions was conducted.  Patient continues on TPN. Denies nausea, progressing with clear liquid diet       LOS: 18 Stephanie [name] 10/5/202112:18 PM

## 2020-07-05 NOTE — Progress Notes (Signed)
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  In the ED, sodium 133, potassium 3.9, chloride 102, bicarb 18, glucose 344, BUN 38, creatinine 1.57, AST 27, ALT 21, troponin 61, lactic acid 3.1, procalcitonin 17, to BC count 20.4, hemoglobin 11.8, platelet 144.  CT angiogram chest negative for PE but with right hydronephrosis which has progressed since October 2020.  Chest x-ray with no acute cardiopulmonary disease process.  TRH consulted for admission secondary to sepsis from UTI/pyelonephritis with subsequent hydronephrosis.   Assessment & Plan:   Principal Problem:   Sepsis ([place]) Active Problems:   Benign essential hypertension   Chronic cystitis   Rheumatoid arthritis involving multiple sites with positive rheumatoid factor (HCC)   Diabetes mellitus (HCC)   Rheumatoid arthritis ([place])   COVID-19 virus infection   AKI (acute kidney injury) ([place])   Atrial fibrillation with rapid ventricular response (HCC)   Essential hypertension   Pure hypercholesterolemia   Acute pyelonephritis   Hydronephrosis, right   Lactic acid acidosis   Hyperglycemia   Hx of essential hypertension   Immunosuppression due to drug therapy ([place])   Pressure injury of skin   Acquired thrombophilia ([place])   On continuous oral anticoagulation   SBO (small bowel obstruction) (HCC)   Goals of care, counseling/discussion   Palliative care by specialist   CKD (chronic kidney disease) stage 5, GFR less than 15 ml/min (HCC)   Ileus (HCC)    Paroxysmal A-fib (HCC)   Sepsis, present on admission Klebsiella pneumonia septicemia, POA Right pyelonephritis Patient presenting with fever, T-max 103.0 with associated tachycardia, tachypnea with a WBC count of 20 K with a left shift and lactic acid of 3.1.  Underwent right ureter stent placement by urology with frank pus noted to kidney.  And blood cultures x2 positive for Klebsiella pneumonia. WBC count improved from 20K to 7.5.  Completed 10-day course of IV antibiotics with Zosyn followed by ceftriaxone followed by ciprofloxacin.  Blood cultures 06/21/2020 negative. --Continue monitor fever curve, supportive care  Severe right hydronephrosis CT renal notable for progressive right UPJ obstruction in comparison to prior study dated October 2020.  Urology was consulted and right stent placed with findings of frank pus in the kidney by Dr. Jeffie Pollock on 06/18/2020.  Follow-up renal ultrasound 06/20/2020 with resolved hydronephrosis. --Continue Foley catheter per urology, Dr. Jeffie Pollock --Continue strict I's and O's  Acute renal failure Patient presenting above with severe right hydronephrosis with associated sepsis from Klebsiella pneumonia septicemia/pyelonephritis.  Baseline creatinine 0.90 January 2021. Creatinine 1.57 on admission, trended up to a high of 6.33 during admission.  Etiology likely secondary to ATN from severe infection as above. --Nephrology following, appreciate assistance --Underwent placement of temporary HD catheter to right common femoral vein by vascular surgery on 06/27/2020.  --Started on dialysis 9/28; further per nephrology, will assess need daily --Cr 6.33>>4.38>3.79>3.35 --Avoid nephrotoxins, renally dose all medications --Closely monitor urinary output --Follow BMP daily  Acute postoperative blood loss anemia Hemoglobin dropped from 8.2 to 7.1 postoperatively on 07/01/2020.  Transfused 1 unit PRBC on 07/01/2020.  Iron panel with  iron 34, TIBC 195, ferritin 605, consistent  with anemia of chronic disease. --Hgb 8.2>7.1>9.1>8.8>8.5>8.1 --Goal maintain hemoglobin greater than 8.0 given cardiac history --Repeat CBC in a.m.  Hypokalemia Hypomagnesemia Potassium 4.0 this morning. Magnesium 2.1 --Goal maintain K 4.0 and Mg 2.0 given card hx and afib --Follow electrolytes daily --Continues on TPN  Small bowel obstruction CT abdomen/pelvis with oral contrast notable for high-grade small bowel obstruction with abrupt transition point central lower abdomen, likely secondary to adhesions.  Underwent exploratory laparotomy on 06/30/2020 by Dr. Dahlia Byes with extensive lysis of adhesions. --NG tube discontinued 10/3 --Continues on TPN; RIJ PICC placed 9/27 --Diet advanced to full liquid diet by general surgery on 10/5 --Continue to monitor bowel movements closely, reported for yesterday --Further per general surgery  Paroxysmal atrial fibrillation with RVR --Cardiology following, appreciate assistance --CHA2DS2-VASc score = 5 (HTN, age, T2DM, gender) --Metoprolol tartrate 50 mg p.o. twice daily --Discontinued heparin drip 2/2 bleeding surrounding HD catheter and thrombocytopenia, will need to resume when able  --continue to monitor on telemetry  GERD: Protonix 40 mg IV every 24 hours  Pressure injury of buttock, stage I, POA --Continue local wound care, offloading  Hx COVID-19 viral infection Patient fully vaccinated, received Pfizer vaccine on 10/16/2019 and 11/07/2019.  Covid-19 PCR positive on 06/09/2020.  No overt respiratory symptoms.  CT chest without acute pulmonary abnormality.  Oxygenating well on room air.  Completed 5-day course of IV remdesivir. --Discontinued airborne/contact isolation since greater than 21 days since initial diagnosis   DVT prophylaxis: SCDs Code Status: DNR Family Communication: Updated patient spouse [name] at bedside yesterday afternoon, no family present at bedside this morning during rounds.  Disposition Plan:  Status is:  Inpatient  Remains inpatient appropriate because:Persistent severe electrolyte disturbances, Ongoing active pain requiring inpatient pain management, Ongoing diagnostic testing needed not appropriate for outpatient work up, Unsafe d/c plan, IV treatments appropriate due to intensity of illness or inability to take PO and Inpatient level of care appropriate due to severity of illness   Dispo: The patient is from: Home              Anticipated d/c is to: SNF              Anticipated d/c date is: > 3 days              Patient currently is not medically stable to d/c.   Consultants:   Cardiology  General surgery  Nephrology  Urology  Palliative care  Procedures:   Right ureteral stent placement, Dr. Jeffie Pollock 9/19  Right femoral HD catheter placement, Dr. Delana Meyer 9/27  Started on HD 9/28  RIJ PICC 9/27  Exploratory laparotomy, Dr Dahlia Byes 06/30/2020  Antimicrobials:   Zosyn 9/19 - 9/23  Ceftriaxone 9/17 - 9/18  Ciprofloxacin 9/23 - 9/26  Azithromycin 9/17 - 9/17  Vancomycin 9/17 - 9/17  Cefepime 9/17 - 9/17    Subjective: Patient seen and examined bedside, resting comfortably.  Reported 4 bowel movements yesterday.  General surgery now further advancing diet to full liquids.  Renal function continues to slowly improve, creatinine now down to 3.35 but continues with significant peripheral edema.  No other complaints or concerns at this time. Denies headache, no visual changes, no chest pain, palpitations, no shortness of breath, no fever/chills/night sweats.  No acute events overnight per nursing staff.   Objective: Vitals:   07/04/20 1950 07/04/20 2320 07/05/20 0433 07/05/20 0738  BP: (!) 185/83 (!) 154/68 (!) 168/64 (!) 170/74  Pulse: 89 79  83 84  Resp: 20 20 20 18   Temp: 98.6 F (37 C) 98.7 F (37.1 C) 98.8 F (37.1 C) 98.7 F (37.1 C)  TempSrc: Oral Oral Oral Oral  SpO2: 97% 97% 95% 96%  Weight:      Height:        Intake/Output Summary (Last 24 hours)  at 07/05/2020 1109 Last data filed at 07/05/2020 0439 Gross per 24 hour  Intake 1592.72 ml  Output 1900 ml  Net -307.28 ml   Filed Weights   06/19/20 0625 06/21/20 0125 06/22/20 0453  Weight: 80.4 kg 82.2 kg 86.8 kg    Examination:  General exam: Appears calm and comfortable, chronically ill in appearance Respiratory system: Clear to auscultation. Respiratory effort normal.  Oxygenating well on room air with SPO2 97% Cardiovascular system: S1 & S2 heard, RRR. No JVD, murmurs, rubs, gallops or clicks.  2-3+ pitting edema bilateral lower extremities up to knee Gastrointestinal system: Abdomen is soft, distended, abdominal surgical incision noted well approximated with staples in place with dried blood. No organomegaly or masses felt.  Faint tinkling bowel sounds noted Central nervous system: Alert and oriented. No focal neurological deficits. Extremities: Symmetric 5 x 5 power. Skin: pressure injury stage 1 buttock Psychiatry: Judgement and insight appear normal. Mood & affect appropriate.     Data Reviewed: I have personally reviewed following labs and imaging studies  CBC: Recent Labs  Lab 06/29/20 0645 06/29/20 0645 07/01/20 0526 07/01/20 0526 07/01/20 1518 07/02/20 0513 07/03/20 0430 07/04/20 0607 07/05/20 0449  WBC 7.5   < > 7.6  --   --  11.7* 11.3* 8.1 8.4  NEUTROABS 6.4  --   --   --   --   --   --  6.7  --   HGB 8.2*   < > 7.1*   < > 9.1* 8.8* 8.5* 8.5* 8.1*  HCT 23.2*   < > 20.8*   < > 25.5* 26.7* 25.8* 25.4* 23.8*  MCV 77.1*   < > 80.9  --   --  85.0 85.7 85.2 85.3  PLT 136*   < > 145*  --   --  127* 111* 85* 83*   < > = values in this interval not displayed.   Basic Metabolic Panel: Recent Labs  Lab 06/30/20 0600 06/30/20 0600 07/01/20 0526 07/02/20 0513 07/03/20 0430 07/04/20 0607 07/05/20 0449  NA 134*   < > 133*  132* 135  135 138 132* 134*  K 3.7   < > 4.1  4.1 4.3  4.3 4.5 4.1 4.0  CL 93*   < > 97*  96* 98  98 101 97* 100  CO2 27   < > 27   26 27  27 31 24 25   GLUCOSE 314*   < > 371*  367* 294*  298* 257* 310* 167*  BUN 62*   < > 74*  74* 86*  85* 91* 94* 99*  CREATININE 4.77*   < > 4.50*  4.84* 4.79*  4.72* 4.38* 3.79* 3.35*  CALCIUM 7.5*   < > 7.8*  7.7* 8.5*  8.4* 8.3* 8.3* 8.2*  MG 2.1   < > 2.0 2.0 1.9 1.7 2.1  PHOS 5.1*  --  4.6 3.3  --  2.1* 2.0*   < > = values in this interval not displayed.   GFR: Estimated Creatinine Clearance: 15.6 mL/min (A) (by C-G formula based on SCr of 3.35 mg/dL (H)). Liver Function Tests: Recent Labs  Lab 06/29/20  7681 06/30/20 0600 07/01/20 0526 07/02/20 0513 07/04/20 0607  AST 14* 14*  --   --  12*  ALT 13 12  --   --  9  ALKPHOS 150* 141*  --   --  111  BILITOT 0.9 0.8  --   --  0.5  PROT 4.6* 4.9*  --   --  4.8*  ALBUMIN 2.1* 2.2* 1.9* 2.0* 1.9*   No results for input(s): LIPASE, AMYLASE in the last 168 hours. No results for input(s): AMMONIA in the last 168 hours. Coagulation Profile: No results for input(s): INR, PROTIME in the last 168 hours. Cardiac Enzymes: No results for input(s): CKTOTAL, CKMB, CKMBINDEX, TROPONINI in the last 168 hours. BNP (last 3 results) No results for input(s): PROBNP in the last 8760 hours. HbA1C: No results for input(s): HGBA1C in the last 72 hours. CBG: Recent Labs  Lab 07/04/20 1612 07/04/20 1954 07/05/20 0031 07/05/20 0434 07/05/20 0738  GLUCAP 289* 260* 205* 163* 156*   Lipid Profile: Recent Labs    07/04/20 0607  TRIG 121   Thyroid Function Tests: No results for input(s): TSH, T4TOTAL, FREET4, T3FREE, THYROIDAB in the last 72 hours. Anemia Panel: No results for input(s): VITAMINB12, FOLATE, FERRITIN, TIBC, IRON, RETICCTPCT in the last 72 hours. Sepsis Labs: No results for input(s): PROCALCITON, LATICACIDVEN in the last 168 hours.  No results found for this or any previous visit (from the past 240 hour(s)).       Radiology Studies: No results found.      Scheduled Meds: . amLODipine  5 mg Oral  Daily  . vitamin C  500 mg Oral Daily  . brimonidine  1 drop Both Eyes BID  . chlorhexidine  15 mL Mouth Rinse BID  . Chlorhexidine Gluconate Cloth  6 each Topical Q0600  . epoetin (EPOGEN/PROCRIT) injection  10,000 Units Subcutaneous Weekly  . feeding supplement (ENSURE ENLIVE)  237 mL Oral BID BM  . insulin aspart  0-15 Units Subcutaneous Q4H  . insulin detemir  8 Units Subcutaneous BID  . mouth rinse  15 mL Mouth Rinse BID  . metoprolol tartrate  50 mg Oral BID  . pantoprazole (PROTONIX) IV  40 mg Intravenous Q24H  . sodium chloride flush  10-40 mL Intracatheter Q12H  . tamsulosin  0.4 mg Oral Daily  . zinc sulfate  220 mg Oral Daily   Continuous Infusions: . sodium chloride 0 mL (06/27/20 1630)  . TPN ADULT (ION) 65 mL/hr at 07/05/20 0400  . TPN ADULT (ION)       LOS: 18 days    Time spent: 37 minutes spent on chart review, discussion with nursing staff, consultants, updating family and interview/physical exam; more than 50% of that time was spent in counseling and/or coordination of care.    Jaylena Holloway J [place] DO [facility] Available via Epic secure chat 7am-7pm After these hours, please refer to coverage provider listed on [email or web address] 07/05/2020, 11:09 AM

## 2020-07-05 NOTE — Consult Note (Addendum)
PHARMACY - TOTAL PARENTERAL NUTRITION CONSULT NOTE   Indication: Small bowel obstruction  Patient Measurements: Height: 5\' 6"  (167.6 cm) Weight: 86.8 kg (191 lb 6.4 oz) IBW/kg (Calculated) : 59.3 TPN AdjBW (KG): 64.5 Body mass index is 30.89 kg/m. 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  Since admission her renal function worsened and she is now requiring hemodialysis, Currently with with combination of paralytic ileus secondary to electrolyte derangements (improved) and partial SBO given previous abdominal surgeries and CT findings on 09/24  Glucose / Insulin:  BG last 24 hours: 163 - 310, required 56 units SSI, Levemir 8u BID + 50u insulin in TPN A1C 8.0 Electrolytes: hyponatremia, improving (secondary to hyperglycemia, fluid overload), hypophosphatemia (refeeding / improving renal function) Renal: Scr decreasing. Good UOP. Nephrology following with daily assessments for iHD. Last HD session was 9/28. LFTs / TGs: LFTs wnl - TG 93 >> 121 Prealbumin / albumin: 12.1 / 1.9 Intake / Output; MIVF: Urine output 1.9 L over the preceding 24 hours - no MIVF GI Imaging:  9/29 Abd xray - stable small bowel dilation noted concerning for distal SBO 9/30 Abd xray - persistent SBO Surgeries / Procedures:  9/18  right ureteral stent placement 9/30 exploratory laparotomy and lysis of adhesions  Central access: 06/27/20 - CVC triple lumen R internal jugular TPN start date: 06/28/20  Nutritional Goals (per RD recommendation on 9/28): Kcal: 1900-2100 Protein: 95-105 grams Fluid: UOP + 1 L  Current Nutrition:  Full liquid diet 10/5  Plan:   Wean TPN to 1/2 rate of 33 mL/hr  Nutritional components:   amino acids: 64 g/L  dextrose:  19%  lipids 39 g/L  kCal: 1022  Total volume: 792 mL  Electrolytes in TPN: standard electrolytes. Will increase to 20 mmol/L of Phos, increase magnesium to 10 mEq/L, continue 45 mEq/L of potassium, Cl:Ac ratio 1:1  Targeting magnesium > 2 may be beneficial given Afib with RVR  Hyperglycemia: SSI 0-15u q4h + Levemir 8u BID (increased 10/3) + decrease insulin in TPN to 25u regular insulin per diabetes coordinator recommendations  Add standard MVI and trace elements to TPN (holding chromium)  Continue to assess need for MIVF based on I&Os. Per surgical note 10/4, patient with edema on exam  Monitor TPN labs on Mon/Thurs, daily until stable  Benita Gutter 07/05/2020 7:35 AM

## 2020-07-05 NOTE — Progress Notes (Addendum)
Inpatient Diabetes Program Recommendations  AACE/ADA: New Consensus Statement on Inpatient Glycemic Control   Target Ranges:  Prepandial:   less than 140 mg/dL      Peak postprandial:   less than 180 mg/dL (1-2 hours)      Critically ill patients:  140 - 180 mg/dL   Results for ZILDA, NO (MRN [id number]) as of 07/05/2020 08:00  Ref. Range 07/04/2020 07:39 07/04/2020 11:45 07/04/2020 16:12 07/04/2020 19:54 07/05/2020 00:31 07/05/2020 04:34 07/05/2020 07:38  Glucose-Capillary Latest Ref Range: 70 - 99 mg/dL 296 (H) 296 (H) 289 (H) 260 (H) 205 (H) 163 (H) 156 (H)   Review of Glycemic Control  Diabetes history: DM2 Outpatient Diabetes medications: Glipizide 10 mg daily, Metformin XR 500 mg BID Current orders for Inpatient glycemic control: Levemir 8 units BID, Novolog 0-15 units Q4H; TPN @ 65 ml/hr with 50 units of insulin  Inpatient Diabetes Program Recommendations:    Insulin: Per communication with Alex, Pharmacist, TPN rate will be cut in half. Therefore, please consider decreasing insulin in TPN to 25 units for new TPN order to start at 18:00.  Thanks, Barnie Alderman, RN, MSN, CDE Diabetes Coordinator Inpatient Diabetes Program [phone] (Team Pager from 8am to 5pm)

## 2020-07-05 NOTE — Progress Notes (Signed)
Physical Therapy Treatment Patient Details Name: Stephanie [name] MRN: [id number] DOB: 10/03/1940 Today's Date: 07/05/2020    History of Present Illness Stephanie [name] is a 79 y/o F w/ PMH: COVID infection, DM, RA, HTN, remote colon CA, who comes to [facility] on 9/17. Patient found to have klebsiella sepsis and had right sided pyelonephritis. She underwent right ureteral stent placed on on 9/18. Pt with acute on chronic renal failure. Temporary fem cath placed 9/27. Pt also with SBO requiring TPN so R IJ placed 9/27 as well. Pt s/p exploratory lap and lysis of adhesions 9/30 with continue at transfer order in place.    PT Comments    Patient presents supine in bed with husband bedside on arrival to room. She is agreeable to bed level exercises due to temporary femoral catheter.Pt performed LE exercises with instructions to perform throughout her day. She is encouraged to use IS and flutter valve once every hour. Her husband notes she has been doing them, but not every hour. She is alert and able to participate well in session this afternoon, demonstrating increased endurance/strength with exercises. Pt will benefit from skilled PT services to address deficits in strength, balance, and decrease risk for future falls.   Follow Up Recommendations  SNF;Supervision for mobility/OOB;Supervision - Intermittent     Equipment Recommendations  None recommended by PT    Recommendations for Other Services       Precautions / Restrictions Precautions Precautions: Fall Precaution Comments: Pt has temp R femoral catheter Restrictions Weight Bearing Restrictions: No Other Position/Activity Restrictions: Pt with temporary femcath to R LE, no sitting up    Mobility  Bed Mobility               General bed mobility comments: not performed as pt with R temporary fem-cath  Transfers                 General transfer comment: Not performed due to R temporary fem-cath  Ambulation/Gait              General Gait Details: Not performed due to temp R fem-cath   Stairs             Wheelchair Mobility    Modified Rankin (Stroke Patients Only)       Balance       Sitting balance - Comments: Did not assess sitting balance this session       Standing balance comment: Did not assess standing balance this session                            Cognition Arousal/Alertness: Awake/alert Behavior During Therapy: WFL for tasks assessed/performed Overall Cognitive Status: Within Functional Limits for tasks assessed                                 General Comments: Patient is [name] and requires some increased processing time      Exercises General Exercises - Upper Extremity Elbow Flexion: AROM;Both;5 reps;Supine Elbow Extension: AROM;Both;10 reps;Supine General Exercises - Lower Extremity Ankle Circles/Pumps: AROM;Both;10 reps;Supine Quad Sets: AROM;Both;10 reps;Supine Gluteal Sets: AROM;Both;10 reps;Supine Heel Slides: Left;10 reps;Supine;AAROM Hip ABduction/ADduction: AAROM;Both;10 reps;Supine Other Exercises Other Exercises: Shoulder shrugs x 10, scapular retracts x 10, flutter breather x 10, ~250-300 mL with IS x 10 breaths    General Comments General comments (skin integrity, edema, etc.): Pt's R wrist shows decreased swelling  but still with red with bruises along her R forearm; some bruises on her L arm      Pertinent Vitals/Pain Pain Assessment: No/denies pain    Home Living                      Prior Function            PT Goals (current goals can now be found in the care plan section) Acute Rehab PT Goals Patient Stated Goal: go home PT Goal Formulation: With patient Time For Goal Achievement: 07/07/20 Potential to Achieve Goals: Fair Progress towards PT goals: Progressing toward goals    Frequency    Min 2X/week      PT Plan Current plan remains appropriate    Co-evaluation              AM-PAC  PT "6 Clicks" Mobility   Outcome Measure  Help needed turning from your back to your side while in a flat bed without using bedrails?: A Lot Help needed moving from lying on your back to sitting on the side of a flat bed without using bedrails?: A Lot Help needed moving to and from a bed to a chair (including a wheelchair)?: Total Help needed standing up from a chair using your arms (e.g., wheelchair or bedside chair)?: Total Help needed to walk in hospital room?: Total Help needed climbing 3-5 steps with a railing? : Total 6 Click Score: 8    End of Session   Activity Tolerance: Patient limited by fatigue;No increased pain Patient left: in bed;with call bell/phone within reach;with bed alarm set;with family/visitor present Nurse Communication: Mobility status PT Visit Diagnosis: Other abnormalities of gait and mobility (R26.89);Muscle weakness (generalized) (M62.81);Dizziness and giddiness (R42);Difficulty in walking, not elsewhere classified (R26.2)     Time: 1351-1401 PT Time Calculation (min) (ACUTE ONLY): 10 min  Charges:                          Noemi Chapel, SPT Bernita Raisin 07/05/2020, 4:01 PM

## 2020-07-06 DIAGNOSIS — N179 Acute kidney failure, unspecified: Secondary | ICD-10-CM | POA: Diagnosis not present

## 2020-07-06 DIAGNOSIS — A419 Sepsis, unspecified organism: Secondary | ICD-10-CM | POA: Diagnosis not present

## 2020-07-06 DIAGNOSIS — N10 Acute tubulo-interstitial nephritis: Secondary | ICD-10-CM | POA: Diagnosis not present

## 2020-07-06 DIAGNOSIS — D6869 Other thrombophilia: Secondary | ICD-10-CM | POA: Diagnosis not present

## 2020-07-06 LAB — PREPARE RBC (CROSSMATCH)

## 2020-07-06 LAB — BASIC METABOLIC PANEL
Anion gap: 8 (ref 5–15)
BUN: 98 mg/dL — ABNORMAL HIGH (ref 8–23)
CO2: 25 mmol/L (ref 22–32)
Calcium: 8 mg/dL — ABNORMAL LOW (ref 8.9–10.3)
Chloride: 102 mmol/L (ref 98–111)
Creatinine, Ser: 2.9 mg/dL — ABNORMAL HIGH (ref 0.44–1.00)
GFR calc non Af Amer: 15 mL/min — ABNORMAL LOW (ref >60)
Glucose, Bld: 115 mg/dL — ABNORMAL HIGH (ref 70–99)
Potassium: 3.7 mmol/L (ref 3.5–5.1)
Sodium: 135 mmol/L (ref 135–145)

## 2020-07-06 LAB — GLUCOSE, CAPILLARY
Glucose-Capillary: 114 mg/dL — ABNORMAL HIGH (ref 70–99)
Glucose-Capillary: 131 mg/dL — ABNORMAL HIGH (ref 70–99)
Glucose-Capillary: 142 mg/dL — ABNORMAL HIGH (ref 70–99)
Glucose-Capillary: 150 mg/dL — ABNORMAL HIGH (ref 70–99)
Glucose-Capillary: 153 mg/dL — ABNORMAL HIGH (ref 70–99)
Glucose-Capillary: 92 mg/dL (ref 70–99)

## 2020-07-06 LAB — CBC
HCT: 21.1 % — ABNORMAL LOW (ref 36.0–46.0)
Hemoglobin: 6.7 g/dL — ABNORMAL LOW (ref 12.0–15.0)
MCH: 28.2 pg (ref 26.0–34.0)
MCHC: 31.8 g/dL (ref 30.0–36.0)
MCV: 88.7 fL (ref 80.0–100.0)
Platelets: 75 10*3/uL — ABNORMAL LOW (ref 150–400)
RBC: 2.38 MIL/uL — ABNORMAL LOW (ref 3.87–5.11)
RDW: 17.1 % — ABNORMAL HIGH (ref 11.5–15.5)
WBC: 7.9 10*3/uL (ref 4.0–10.5)
nRBC: 0 % (ref 0.0–0.2)

## 2020-07-06 LAB — HEMOGLOBIN AND HEMATOCRIT, BLOOD
HCT: 24.4 % — ABNORMAL LOW (ref 36.0–46.0)
Hemoglobin: 8 g/dL — ABNORMAL LOW (ref 12.0–15.0)

## 2020-07-06 LAB — PHOSPHORUS: Phosphorus: 2.6 mg/dL (ref 2.5–4.6)

## 2020-07-06 LAB — MAGNESIUM: Magnesium: 2 mg/dL (ref 1.7–2.4)

## 2020-07-06 MED ORDER — SODIUM CHLORIDE 0.9% IV SOLUTION: Freq: Once | INTRAVENOUS | Status: AC

## 2020-07-06 MED ORDER — DILTIAZEM HCL ER COATED BEADS 120 MG PO CP24
120.0000 mg | ORAL_CAPSULE | Freq: Every day | ORAL | Status: DC
  Administered 2020-07-06 – 2020-07-09 (×4): 120 mg via ORAL
  Filled 2020-07-06 (×4): qty 1

## 2020-07-06 MED ORDER — INSULIN ASPART 100 UNIT/ML ~~LOC~~ SOLN
0.0000 [IU] | Freq: Three times a day (TID) | SUBCUTANEOUS | Status: DC
  Administered 2020-07-07 (×2): 5 [IU] via SUBCUTANEOUS
  Administered 2020-07-07 – 2020-07-08 (×5): 3 [IU] via SUBCUTANEOUS
  Administered 2020-07-08: 2 [IU] via SUBCUTANEOUS
  Administered 2020-07-09: 8 [IU] via SUBCUTANEOUS
  Administered 2020-07-09: 3 [IU] via SUBCUTANEOUS
  Administered 2020-07-09: 5 [IU] via SUBCUTANEOUS
  Administered 2020-07-09: 2 [IU] via SUBCUTANEOUS
  Administered 2020-07-10: 3 [IU] via SUBCUTANEOUS
  Administered 2020-07-10: 5 [IU] via SUBCUTANEOUS
  Filled 2020-07-06 (×14): qty 1

## 2020-07-06 NOTE — Progress Notes (Signed)
PROGRESS NOTE    Stephanie [name]  IRS:[id number] DOB: [birth year] DOA: 06/17/2020 PCP: Rusty Aus, MD  Brief Narrative:  Stephanie [name] is a 79 year old female with past medical history notable for type 2 diabetes mellitus, rheumatoid arthritis on chronic immunosuppressive therapy, essential hypertension, history of remote colon cancer and Covid-19 viral infection with positive PCR test June 09, 2020 who presented to the ED with with complaint of fever, progressive shortness of breath, and generalized weakness.  Postop day 6.  Recovering well overall however had some acute anemia noted this morning.  Unclear source of blood loss.    Assessment & Plan:   Principal Problem:   Sepsis ([place]) Active Problems:   Benign essential hypertension   Chronic cystitis   Rheumatoid arthritis involving multiple sites with positive rheumatoid factor (HCC)   Diabetes mellitus (HCC)   Rheumatoid arthritis ([place])   COVID-19 virus infection   AKI (acute kidney injury) ([place])   Atrial fibrillation with rapid ventricular response (HCC)   Essential hypertension   Pure hypercholesterolemia   Acute pyelonephritis   Hydronephrosis, right   Lactic acid acidosis   Hyperglycemia   Hx of essential hypertension   Immunosuppression due to drug therapy ([place])   Pressure injury of skin   Acquired thrombophilia ([place])   On continuous oral anticoagulation   SBO (small bowel obstruction) (HCC)   Goals of care, counseling/discussion   Palliative care by specialist   CKD (chronic kidney disease) stage 5, GFR less than 15 ml/min (HCC)   Ileus (HCC)   Paroxysmal A-fib (HCC)  Sepsis, present on admission Klebsiella pneumonia septicemia, POA Right pyelonephritis Patient presenting with fever, T-max 103.0 with associated tachycardia, tachypnea with a WBC count of 20 K with a left shift and lactic acid of 3.1.  Underwent right ureter stent placement by urology with frank pus noted to kidney.  And blood cultures x2  positive for Klebsiella pneumonia. WBC count improved from 20K to 7.5.  Completed 10-day course of IV antibiotics with Zosyn followed by ceftriaxone followed by ciprofloxacin.  Blood cultures 06/21/2020 negative. --Continue monitor fever curve, supportive care  Severe right hydronephrosis CT renal notable for progressive right UPJ obstruction in comparison to prior study dated October 2020.  Urology was consulted and right stent placed with findings of frank pus in the kidney by Dr. Jeffie Pollock on 06/18/2020.  Follow-up renal ultrasound 06/20/2020 with resolved hydronephrosis. --Continue Foley catheter per urology, Dr. Jeffie Pollock --Continue strict I's and O's  Acute renal failure Patient presenting above with severe right hydronephrosis with associated sepsis from Klebsiella pneumonia septicemia/pyelonephritis.  Baseline creatinine 0.90 January 2021. Creatinine 1.57 on admission, trended up to a high of 6.33 during admission.  Etiology likely secondary to ATN from severe infection as above. --Nephrology following, appreciate assistance --Underwent placement of temporary HD catheter to right common femoral vein by vascular surgery on 06/27/2020.  --Started on dialysis 9/28; further per nephrology, will assess need daily --Cr 6.33>>4.38>3.79>3.35>2.90 --Avoid nephrotoxins, renally dose all medications --Closely monitor urinary output --Follow BMP daily  Acute postoperative blood loss anemia Hemoglobin dropped from 8.2 to 7.1 postoperatively on 07/01/2020.  Transfused 1 unit PRBC on 07/01/2020.  Iron panel with iron 34, TIBC 195, ferritin 605, consistent with anemia of chronic disease. --Hgb 8.2>7.1>9.1>8.8>8.5>8.1>6.7 --Goal maintain hemoglobin greater than 8.0 given cardiac history Plan: Transfuse 1 unit packed red blood cells Posttransfusion H&H   Hypokalemia Hypomagnesemia Replete as necessary --Goal maintain K 4.0 and Mg 2.0 given card hx and afib --Follow electrolytes daily --Continues  on  TPN  Small bowel obstruction CT abdomen/pelvis with oral contrast notable for high-grade small bowel obstruction with abrupt transition point central lower abdomen, likely secondary to adhesions.  Underwent exploratory laparotomy on 06/30/2020 by Dr. Dahlia Byes with extensive lysis of adhesions. --NG tube discontinued 10/3 --Continues on TPN; RIJ PICC placed 9/27 --Diet advanced to full liquid diet by general surgery on 10/5.  Advance to regular on 07/06/2020 --Continue to monitor bowel movements closely, reported for yesterday --Further per general surgery --If patient tolerates regular diet anticipate discontinuation of TPN  Paroxysmal atrial fibrillation with RVR --Cardiology following, appreciate assistance --CHA2DS2-VASc score = 5 (HTN, age, T2DM, gender) --Metoprolol tartrate 50 mg p.o. twice daily --Cardizem CD 120 mg daily --Discontinued heparin drip 2/2 bleeding surrounding HD catheter and thrombocytopenia, will need to resume when able  --continue to monitor on telemetry -Follow cardiology recs for A. fib management  GERD: Protonix 40 mg IV every 24 hours  Pressure injury of buttock, stage I, POA --Continue local wound care, offloading  Hx COVID-19 viral infection Patient fully vaccinated, received Pfizer vaccine on 10/16/2019 and 11/07/2019.  Covid-19 PCR positive on 06/09/2020.  No overt respiratory symptoms.  CT chest without acute pulmonary abnormality.  Oxygenating well on room air.  Completed 5-day course of IV remdesivir. --Discontinued airborne/contact isolation since greater than 21 days since initial diagnosis   DVT prophylaxis: SCD Code Status: DNR Family Communication: None today Disposition Plan: Status is: Inpatient  Remains inpatient appropriate because:Inpatient level of care appropriate due to severity of illness   Dispo:  Patient From: Home  Planned Disposition: [place]  Expected discharge date: 3 days  Medically stable for discharge:  No   Remains inpatient appropriate.  Continues on TPN.  Slowly advancing diet.  Postoperative anemia requiring transfusion noted.  On will need ideally discontinuation of TPN prior to disposition planning.       Consultants:   Cardiology, nephrology, surgery  Procedures:   Ex lap with lysis of adhesions  Antimicrobials:   None   Subjective: Seen and examined.  No pain complaints this morning.  Stephanie [name] is some fatigue.  Objective: Vitals:   07/06/20 0859 07/06/20 1148 07/06/20 1432 07/06/20 1508  BP: 139/67 (!) 151/69 (!) 143/54 (!) 150/60  Pulse: 81 83 84 84  Resp: 17 16 18 18   Temp: 97.8 F (36.6 C) 98.7 F (37.1 C) 98.2 F (36.8 C) 98.3 F (36.8 C)  TempSrc: Oral Oral Oral Oral  SpO2: 96% 97% 96% 100%  Weight:      Height:        Intake/Output Summary (Last 24 hours) at 07/06/2020 1509 Last data filed at 07/06/2020 0409 Gross per 24 hour  Intake 10 ml  Output 1575 ml  Net -1565 ml   Filed Weights   06/21/20 0125 06/22/20 0453 07/06/20 0500  Weight: 82.2 kg 86.8 kg 93.7 kg    Examination:  General exam: No acute distress.  Appears chronically ill Respiratory system: Poor respiratory effort.  Bibasilar crackles.  Normal work of breathing.  Room air  cardiovascular system: S1-S2 heard, regular rate, irregular rhythm, no murmurs Gastrointestinal system: Soft, nondistended, mild tender to palpation suprapubic region.  Normal bowel sounds. Central nervous system: Alert, oriented x2.  No focal deficits Extremities: Diffusely decreased power in extremities bilaterally Skin: No rashes, lesions or ulcers Psychiatry: Judgment and insight appear poor.  Mood and affect normal    Data Reviewed: I have personally reviewed following labs and imaging studies  CBC: Recent Labs  Lab 07/02/20 0513 07/03/20 0430 07/04/20 0607 07/05/20 0449 07/06/20 0533  WBC 11.7* 11.3* 8.1 8.4 7.9  NEUTROABS  --   --  6.7  --   --   HGB 8.8* 8.5* 8.5* 8.1* 6.7*  HCT 26.7*  25.8* 25.4* 23.8* 21.1*  MCV 85.0 85.7 85.2 85.3 88.7  PLT 127* 111* 85* 83* 75*   Basic Metabolic Panel: Recent Labs  Lab 07/01/20 0526 07/01/20 0526 07/02/20 0513 07/03/20 0430 07/04/20 0607 07/05/20 0449 07/06/20 0533  NA 133*  132*   < > 135  135 138 132* 134* 135  K 4.1  4.1   < > 4.3  4.3 4.5 4.1 4.0 3.7  CL 97*  96*   < > 98  98 101 97* 100 102  CO2 27  26   < > 27  27 31 24 25 25   GLUCOSE 371*  367*   < > 294*  298* 257* 310* 167* 115*  BUN 74*  74*   < > 86*  85* 91* 94* 99* 98*  CREATININE 4.50*  4.84*   < > 4.79*  4.72* 4.38* 3.79* 3.35* 2.90*  CALCIUM 7.8*  7.7*   < > 8.5*  8.4* 8.3* 8.3* 8.2* 8.0*  MG 2.0   < > 2.0 1.9 1.7 2.1 2.0  PHOS 4.6  --  3.3  --  2.1* 2.0* 2.6   < > = values in this interval not displayed.   GFR: Estimated Creatinine Clearance: 18.7 mL/min (A) (by C-G formula based on SCr of 2.9 mg/dL (H)). Liver Function Tests: Recent Labs  Lab 06/30/20 0600 07/01/20 0526 07/02/20 0513 07/04/20 0607  AST 14*  --   --  12*  ALT 12  --   --  9  ALKPHOS 141*  --   --  111  BILITOT 0.8  --   --  0.5  PROT 4.9*  --   --  4.8*  ALBUMIN 2.2* 1.9* 2.0* 1.9*   No results for input(s): LIPASE, AMYLASE in the last 168 hours. No results for input(s): AMMONIA in the last 168 hours. Coagulation Profile: No results for input(s): INR, PROTIME in the last 168 hours. Cardiac Enzymes: No results for input(s): CKTOTAL, CKMB, CKMBINDEX, TROPONINI in the last 168 hours. BNP (last 3 results) No results for input(s): PROBNP in the last 8760 hours. HbA1C: No results for input(s): HGBA1C in the last 72 hours. CBG: Recent Labs  Lab 07/05/20 2336 07/06/20 0026 07/06/20 0405 07/06/20 0752 07/06/20 1143  GLUCAP 145* 153* 131* 92 142*   Lipid Profile: Recent Labs    07/04/20 0607  TRIG 121   Thyroid Function Tests: No results for input(s): TSH, T4TOTAL, FREET4, T3FREE, THYROIDAB in the last 72 hours. Anemia Panel: No results for input(s):  VITAMINB12, FOLATE, FERRITIN, TIBC, IRON, RETICCTPCT in the last 72 hours. Sepsis Labs: No results for input(s): PROCALCITON, LATICACIDVEN in the last 168 hours.  No results found for this or any previous visit (from the past 240 hour(s)).       Radiology Studies: No results found.      Scheduled Meds: . vitamin C  500 mg Oral Daily  . brimonidine  1 drop Both Eyes BID  . chlorhexidine  15 mL Mouth Rinse BID  . Chlorhexidine Gluconate Cloth  6 each Topical Q0600  . diltiazem  120 mg Oral Daily  . epoetin (EPOGEN/PROCRIT) injection  10,000 Units Subcutaneous Weekly  . feeding supplement (ENSURE ENLIVE)  237 mL Oral BID BM  .  insulin aspart  0-15 Units Subcutaneous Q4H  . mouth rinse  15 mL Mouth Rinse BID  . metoprolol tartrate  50 mg Oral BID  . pantoprazole (PROTONIX) IV  40 mg Intravenous Q24H  . sodium chloride flush  10-40 mL Intracatheter Q12H  . tamsulosin  0.4 mg Oral Daily  . zinc sulfate  220 mg Oral Daily   Continuous Infusions: . sodium chloride 0 mL (06/27/20 1630)  . TPN ADULT (ION) 33 mL/hr at 07/06/20 1029     LOS: 19 days    Time spent: 25 minutes    Sidney Ace, MD [facility] Pager [phone]  If 7PM-7AM, please contact night-coverage 07/06/2020, 3:09 PM

## 2020-07-06 NOTE — Progress Notes (Signed)
Right femoral cath removed at this time. Patient with copious amount of bleeding following removal despite nurse holding pressure. Charge nurse in to assist. Bleeding controlled with holding additional pressure.

## 2020-07-06 NOTE — Progress Notes (Signed)
Patient with blood infusing, patient c/o nausea and had small amount of clear emesis. Patients spouse stated she was nauseated prior to start of blood transfusion. Blood transfusion paused while Dr. Priscella Mann was notified via secure chat. He stated to continue transfusion and medicate for nausea. Blood resumed and IV Zofran given.

## 2020-07-06 NOTE — Progress Notes (Signed)
PT Cancellation Note  Patient Details Name: Stephanie [name] MRN: [id number] DOB: Jan 09, 1941   Cancelled Treatment:    Reason Eval/Treat Not Completed: Other (comment). Pt with low Hgb with plans for transfusion this date. Also, notes indicate removal of temp fem cath this date. Will plan to initiate OOB mobility next date, if medically stable.   Tametria Aho 07/06/2020, 2:12 PM Greggory Stallion, PT, DPT [phone]

## 2020-07-06 NOTE — Progress Notes (Signed)
[facility] Day(s): [place] op day(s): 6 Days Post-Op.   Interval History:  Patient seen and examined no acute events or new complaints overnight.  Patient reports she is doing well Incisional soreness, no distension, fever, chills, nausea, emesis WBC remains normal at 7.9, no fevers Hgb trending down, now 6.7, may be dilutional, anticoagulation being held PLT trending down as well 75k (from 83K) Renal function gradually improving, sCr - 2.90, UO - 2.6L No significant electrolyte derangements Followed by cardiology for atrial fibrillation, rate controlled with PO metoprolol On FLD + 1/2 TPN, tolerating well She reports she had 3 BMs in last 24 hours, + flatus Working with therapies; recommending SNF   Vital signs in last 24 hours: [min-max] current  Temp:  [97.8 F (36.6 C)-100.3 F (37.9 C)] 98.6 F (37 C) (10/06 0405) Pulse Rate:  [82-98] 82 (10/06 0405) Resp:  [16-20] 16 (10/06 0405) BP: (141-170)/(64-74) 148/66 (10/06 0405) SpO2:  [95 %-96 %] 96 % (10/06 0405) Weight:  [93.7 kg] 93.7 kg (10/06 0500)     Height: 5\' 6"  (167.6 cm) Weight: 93.7 kg BMI (Calculated): 33.36   Intake/Output last 2 shifts:  10/05 0701 - 10/06 0700 In: 725 [I.V.:725] Out: 2675 [Urine:2675]   Physical Exam:  Constitutional: alert, cooperative and no distress HENT: normocephalic without obvious abnormality Eyes: PERRL, EOM's grossly intact and symmetric  Respiratory: breathing non-labored at rest  Cardiovascular: regular rate and sinus rhythm  Gastrointestinal:Soft,no appreciable soreness,distension continues to improve, certainly no rebound/guarding/peritonitis Integumentary: Laparotomy incision is CDI with staples,no erythema Musculoskeletal:Marked edema, +2, to bilateral LE  Labs:  CBC Latest Ref Rng & Units 07/06/2020 07/05/2020 07/04/2020  WBC 4.0 - 10.5 K/uL 7.9 8.4 8.1  Hemoglobin 12.0 - 15.0 g/dL 6.7(L) 8.1(L) 8.5(L)   Hematocrit 36 - 46 % 21.1(L) 23.8(L) 25.4(L)  Platelets 150 - 400 K/uL 75(L) 83(L) 85(L)   CMP Latest Ref Rng & Units 07/06/2020 07/05/2020 07/04/2020  Glucose 70 - 99 mg/dL 115(H) 167(H) 310(H)  BUN 8 - 23 mg/dL 98(H) 99(H) 94(H)  Creatinine 0.44 - 1.00 mg/dL 2.90(H) 3.35(H) 3.79(H)  Sodium 135 - 145 mmol/L 135 134(L) 132(L)  Potassium 3.5 - 5.1 mmol/L 3.7 4.0 4.1  Chloride 98 - 111 mmol/L 102 100 97(L)  CO2 22 - 32 mmol/L 25 25 24   Calcium 8.9 - 10.3 mg/dL 8.0(L) 8.2(L) 8.3(L)  Total Protein 6.5 - 8.1 g/dL - - 4.8(L)  Total Bilirubin 0.3 - 1.2 mg/dL - - 0.5  Alkaline Phos 38 - 126 U/L - - 111  AST 15 - 41 U/L - - 12(L)  ALT 0 - 44 U/L - - 9     Imaging studies: No new pertinent imaging studies   Assessment/Plan:  79 y.o. female with anemia, multifactorial, 6 Days Post-Op s/p exploratory laparotomy and lysis of adhesionsfor partial small bowel obstruction secondary to post-surgical adhesions, complicated by pertinent comorbidities includingdebilitation, numerousmedicalconditionsnow with ESRD.    - Monitor H&H, likely benefit from 1 unit pRBCs    - Advance to soft diet this morning + nutritional supplementation   - If tolerate soft diet today; okay to discontinue TPN today             - monitor electrolytes - Monitor abdominal examination; on-going bowel function - Pain control prn; antiemetics prn - appreciate nephrology and cardiology consultations and recommendations - Mobilization encouraged;PT following --> recommendations for SNF - Further management per primary team; we will follow  All of the  above findings and recommendations were discussed with the patient, and the medical team, and all of patient's questions were answered toherexpressed satisfaction.  -- Edison Simon, PA-C [facility] 07/06/2020, 7:15 AM [phone] M-F: 7am - 4pm

## 2020-07-06 NOTE — Progress Notes (Signed)
She is maintaining sinus rhythm on telemetry. Discussed with MD. Will stop amlodipine and add Cardizem CD 120 mg daily.

## 2020-07-06 NOTE — Progress Notes (Signed)
[facility]  ROUNDING NOTE   Subjective:   Patient appears comfortable, lying in bed, denies shortness of breath.  She is able to advance to soft diet today.  TPN discontinued.  She denies nausea or vomiting Urine output for the preceding 24 hours is 2675 ml.  10/05 0701 - 10/06 0700 In: 725 [I.V.:725] Out: 2675 [Urine:2675]   Objective:  Vital signs in last 24 hours:  Temp:  [97.8 F (36.6 C)-100.3 F (37.9 C)] 98.7 F (37.1 C) (10/06 1148) Pulse Rate:  [81-98] 83 (10/06 1148) Resp:  [16-20] 16 (10/06 1148) BP: (139-158)/(64-69) 151/69 (10/06 1148) SpO2:  [95 %-97 %] 97 % (10/06 1148) Weight:  [93.7 kg] 93.7 kg (10/06 0500)  Weight change:  Filed Weights   06/21/20 0125 06/22/20 0453 07/06/20 0500  Weight: 82.2 kg 86.8 kg 93.7 kg    Intake/Output: I/O last 3 completed shifts: In: 1342.6 [I.V.:1342.6] Out: 5852 [Urine:4575]   Intake/Output this shift:  No intake/output data recorded.  Physical Exam: General:  Lying in bed, appears pleasant and comfortable  Head:  Normocephalic, atraumatic  Lungs:   Lungs with wheezes +, respirations even unlabored  Heart:  Regular, S1-S2 no rubs or gallops  Abdomen:   Mildly distended, no tenderness  Extremities:  2+ pitting edema  Neurologic:  Awake, alert ,speech clear, answers questions appropriately  Skin: No new rashes or lesions  Access: Rt fem temp dialysis    Basic Metabolic Panel: Recent Labs  Lab 07/01/20 0526 07/01/20 0526 07/02/20 0513 07/02/20 0513 07/03/20 0430 07/03/20 0430 07/04/20 0607 07/05/20 0449 07/06/20 0533  NA 133*   132*   < > 135   135  --  138  --  132* 134* 135  K 4.1   4.1   < > 4.3   4.3  --  4.5  --  4.1 4.0 3.7  CL 97*   96*   < > 98   98  --  101  --  97* 100 102  CO2 27   26   < > 27   27  --  31  --  24 25 25   GLUCOSE 371*   367*   < > 294*   298*  --  257*  --  310* 167* 115*  BUN 74*   74*   < > 86*   85*  --  91*  --  94* 99* 98*  CREATININE 4.50*   4.84*   < >  4.79*   4.72*  --  4.38*  --  3.79* 3.35* 2.90*  CALCIUM 7.8*   7.7*   < > 8.5*   8.4*   < > 8.3*   < > 8.3* 8.2* 8.0*  MG 2.0   < > 2.0  --  1.9  --  1.7 2.1 2.0  PHOS 4.6  --  3.3  --   --   --  2.1* 2.0* 2.6   < > = values in this interval not displayed.    Liver Function Tests: Recent Labs  Lab 06/30/20 0600 07/01/20 0526 07/02/20 0513 07/04/20 0607  AST 14*  --   --  12*  ALT 12  --   --  9  ALKPHOS 141*  --   --  111  BILITOT 0.8  --   --  0.5  PROT 4.9*  --   --  4.8*  ALBUMIN 2.2* 1.9* 2.0* 1.9*   No results for input(s): LIPASE, AMYLASE in the last  168 hours. No results for input(s): AMMONIA in the last 168 hours.  CBC: Recent Labs  Lab 07/02/20 0513 07/03/20 0430 07/04/20 0607 07/05/20 0449 07/06/20 0533  WBC 11.7* 11.3* 8.1 8.4 7.9  NEUTROABS  --   --  6.7  --   --   HGB 8.8* 8.5* 8.5* 8.1* 6.7*  HCT 26.7* 25.8* 25.4* 23.8* 21.1*  MCV 85.0 85.7 85.2 85.3 88.7  PLT 127* 111* 85* 83* 75*    Cardiac Enzymes: No results for input(s): CKTOTAL, CKMB, CKMBINDEX, TROPONINI in the last 168 hours.  BNP: Invalid input(s): POCBNP  CBG: Recent Labs  Lab 07/05/20 2336 07/06/20 0026 07/06/20 0405 07/06/20 0752 07/06/20 1143  GLUCAP 145* 153* 131* 92 142*    Microbiology: Results for orders placed or performed during the hospital encounter of 06/17/20  Blood culture (routine x 2)     Status: Abnormal   Collection Time: 06/17/20  3:57 AM   Specimen: BLOOD  Result Value Ref Range Status   Specimen Description   Final    BLOOD LEFT HAND Performed at [facility], [address] [place], [place] [address]    Special Requests   Final    BOTTLES DRAWN AEROBIC AND ANAEROBIC Blood Culture adequate volume Performed at [facility], [address] [place], [place] [address]    Culture  Setup Time   Final    Organism ID to follow GRAM NEGATIVE RODS IN BOTH AEROBIC AND ANAEROBIC BOTTLES CRITICAL RESULT CALLED TO, READ BACK BY AND  VERIFIED WITH: AMY THOMPSON AT 1534 ON 06/17/20 [place] Performed at [facility], [address] [place], [place] [address]    Culture KLEBSIELLA PNEUMONIAE (A)  Final   Report Status 06/19/2020 FINAL  Final   Organism ID, Bacteria KLEBSIELLA PNEUMONIAE  Final      Susceptibility   Klebsiella pneumoniae - MIC*    AMPICILLIN RESISTANT Resistant     CEFAZOLIN <=4 SENSITIVE Sensitive     CEFEPIME <=0.12 SENSITIVE Sensitive     CEFTAZIDIME <=1 SENSITIVE Sensitive     CEFTRIAXONE <=0.25 SENSITIVE Sensitive     CIPROFLOXACIN <=0.25 SENSITIVE Sensitive     GENTAMICIN <=1 SENSITIVE Sensitive     IMIPENEM <=0.25 SENSITIVE Sensitive     TRIMETH/SULFA <=20 SENSITIVE Sensitive     AMPICILLIN/SULBACTAM 4 SENSITIVE Sensitive     PIP/TAZO <=4 SENSITIVE Sensitive     * KLEBSIELLA PNEUMONIAE  Blood culture (routine x 2)     Status: Abnormal   Collection Time: 06/17/20  3:57 AM   Specimen: BLOOD  Result Value Ref Range Status   Specimen Description   Final    BLOOD RIGHT [facility] Performed at [facility], [address] [place], [place] [address]    Special Requests   Final    BOTTLES DRAWN AEROBIC AND ANAEROBIC Blood Culture adequate volume Performed at [facility], [address] [place], [place] [address]    Culture  Setup Time   Final    GRAM NEGATIVE RODS IN BOTH AEROBIC AND ANAEROBIC BOTTLES CRITICAL VALUE NOTED.  VALUE IS CONSISTENT WITH PREVIOUSLY REPORTED AND CALLED VALUE. Performed at [facility], [address] [place],  [address]    Culture (A)  Final    KLEBSIELLA PNEUMONIAE SUSCEPTIBILITIES PERFORMED ON PREVIOUS CULTURE WITHIN THE LAST 5 DAYS. Performed at [facility], [place] [address], [place],  [address]    Report Status 06/19/2020 FINAL  Final  Blood Culture ID Panel (Reflexed)  Status: Abnormal   Collection Time: 06/17/20  3:57 AM  Result Value Ref Range Status   Enterococcus faecalis NOT DETECTED NOT DETECTED  Final   Enterococcus Faecium NOT DETECTED NOT DETECTED Final   Listeria monocytogenes NOT DETECTED NOT DETECTED Final   Staphylococcus species NOT DETECTED NOT DETECTED Final   Staphylococcus aureus (BCID) NOT DETECTED NOT DETECTED Final   Staphylococcus epidermidis NOT DETECTED NOT DETECTED Final   Staphylococcus lugdunensis NOT DETECTED NOT DETECTED Final   Streptococcus species NOT DETECTED NOT DETECTED Final   Streptococcus agalactiae NOT DETECTED NOT DETECTED Final   Streptococcus pneumoniae NOT DETECTED NOT DETECTED Final   Streptococcus pyogenes NOT DETECTED NOT DETECTED Final   A.calcoaceticus-baumannii NOT DETECTED NOT DETECTED Final   Bacteroides fragilis NOT DETECTED NOT DETECTED Final   Enterobacterales DETECTED (A) NOT DETECTED Final    Comment: Enterobacterales represent a large order of gram negative bacteria, not a single organism. CRITICAL RESULT CALLED TO, READ BACK BY AND VERIFIED WITH: AMY THOMPSON AT 0960 ON 06/17/20 SNG    Enterobacter cloacae complex NOT DETECTED NOT DETECTED Final   Escherichia coli NOT DETECTED NOT DETECTED Final   Klebsiella aerogenes NOT DETECTED NOT DETECTED Final   Klebsiella oxytoca NOT DETECTED NOT DETECTED Final   Klebsiella pneumoniae DETECTED (A) NOT DETECTED Final    Comment: CRITICAL RESULT CALLED TO, READ BACK BY AND VERIFIED WITH: AMY THOMPSON RN AT 4540 ON 06/17/20 SNG    Proteus species NOT DETECTED NOT DETECTED Final   Salmonella species NOT DETECTED NOT DETECTED Final   Serratia marcescens NOT DETECTED NOT DETECTED Final   Haemophilus influenzae NOT DETECTED NOT DETECTED Final   Neisseria meningitidis NOT DETECTED NOT DETECTED Final   Pseudomonas aeruginosa NOT DETECTED NOT DETECTED Final   Stenotrophomonas maltophilia NOT DETECTED NOT DETECTED Final   Candida albicans NOT DETECTED NOT DETECTED Final   Candida auris NOT DETECTED NOT DETECTED Final   Candida glabrata NOT DETECTED NOT DETECTED Final   Candida krusei NOT  DETECTED NOT DETECTED Final   Candida parapsilosis NOT DETECTED NOT DETECTED Final   Candida tropicalis NOT DETECTED NOT DETECTED Final   Cryptococcus neoformans/gattii NOT DETECTED NOT DETECTED Final   CTX-M ESBL NOT DETECTED NOT DETECTED Final   Carbapenem resistance IMP NOT DETECTED NOT DETECTED Final   Carbapenem resistance KPC NOT DETECTED NOT DETECTED Final   Carbapenem resistance NDM NOT DETECTED NOT DETECTED Final   Carbapenem resist OXA 48 LIKE NOT DETECTED NOT DETECTED Final   Carbapenem resistance VIM NOT DETECTED NOT DETECTED Final    Comment: Performed at [facility], [address] [place], [place] [address]  Urine Culture     Status: Abnormal   Collection Time: 06/17/20 11:01 AM   Specimen: Urine, Random  Result Value Ref Range Status   Specimen Description   Final    URINE, RANDOM Performed at [facility], [address], [place], [place] [address]    Special Requests   Final    NONE Performed at [facility], [address] [place], [place] [address]    Culture >=100,000 COLONIES/mL KLEBSIELLA PNEUMONIAE (A)  Final   Report Status 06/19/2020 FINAL  Final   Organism ID, Bacteria KLEBSIELLA PNEUMONIAE (A)  Final      Susceptibility   Klebsiella pneumoniae - MIC*    AMPICILLIN RESISTANT Resistant     CEFAZOLIN <=4 SENSITIVE Sensitive     CEFTRIAXONE <=0.25 SENSITIVE Sensitive     CIPROFLOXACIN <=0.25 SENSITIVE Sensitive  GENTAMICIN <=1 SENSITIVE Sensitive     IMIPENEM <=0.25 SENSITIVE Sensitive     NITROFURANTOIN 32 SENSITIVE Sensitive     TRIMETH/SULFA <=20 SENSITIVE Sensitive     AMPICILLIN/SULBACTAM 4 SENSITIVE Sensitive     PIP/TAZO <=4 SENSITIVE Sensitive     * >=100,000 COLONIES/mL KLEBSIELLA PNEUMONIAE  Culture, blood (Routine X 2) w Reflex to ID Panel     Status: None   Collection Time: 06/21/20 11:07 AM   Specimen: BLOOD  Result Value Ref Range Status   Specimen Description BLOOD RIGHT [facility]  Final   Special Requests    Final    BOTTLES DRAWN AEROBIC AND ANAEROBIC Blood Culture adequate volume   Culture   Final    NO GROWTH 5 DAYS Performed at [facility], [address] [place], [place] [address]    Report Status 06/26/2020 FINAL  Final  Culture, blood (Routine X 2) w Reflex to ID Panel     Status: None   Collection Time: 06/21/20 11:09 AM   Specimen: BLOOD  Result Value Ref Range Status   Specimen Description BLOOD [place]  Final   Special Requests   Final    BOTTLES DRAWN AEROBIC AND ANAEROBIC Blood Culture adequate volume   Culture   Final    NO GROWTH 5 DAYS Performed at [facility], [address] [place], [place] [address]    Report Status 06/26/2020 FINAL  Final    Coagulation Studies: No results for input(s): LABPROT, INR in the last 72 hours.  Urinalysis: No results for input(s): COLORURINE, LABSPEC, PHURINE, GLUCOSEU, HGBUR, BILIRUBINUR, KETONESUR, PROTEINUR, UROBILINOGEN, NITRITE, LEUKOCYTESUR in the last 72 hours.  Invalid input(s): APPERANCEUR    Imaging: No results found.   Medications:    sodium chloride 0 mL (06/27/20 1630)   TPN ADULT (ION) 33 mL/hr at 07/06/20 1029    sodium chloride   Intravenous Once   vitamin C  500 mg Oral Daily   brimonidine  1 drop Both Eyes BID   chlorhexidine  15 mL Mouth Rinse BID   Chlorhexidine Gluconate Cloth  6 each Topical Q0600   diltiazem  120 mg Oral Daily   epoetin (EPOGEN/PROCRIT) injection  10,000 Units Subcutaneous Weekly   feeding supplement (ENSURE ENLIVE)  237 mL Oral BID BM   insulin aspart  0-15 Units Subcutaneous Q4H   mouth rinse  15 mL Mouth Rinse BID   metoprolol tartrate  50 mg Oral BID   pantoprazole (PROTONIX) IV  40 mg Intravenous Q24H   sodium chloride flush  10-40 mL Intracatheter Q12H   tamsulosin  0.4 mg Oral Daily   zinc sulfate  220 mg Oral Daily   sodium chloride, acetaminophen, albuterol, guaiFENesin-dextromethorphan, heparin, labetalol, morphine injection,  ondansetron (ZOFRAN) IV  Assessment/ Plan:  Ms. Stephanie [name] is a 79 y.o.  female admitted to [facility] for COVID pneumonia. Patient found to have klebsiella sepsis and had right sided pyelonephritis. She underwent right ureteral stent placed on on 9/18.  Patient had IV contrast exposure on 9/17.   # AKI secondary to ATN Patient has AKI, likely secondary to sepsis/obstructive uropathy/did have IV contrast exposure/hypotension. BASELINE Cr 0.90 in January 2021 Received dialysis 1st treatment 06/28/2020 Creatinine level trending down 2.90 today Urine output 2675 ml Will plan to d/c femoral catheter as dialysis not required at this point   Lab Results  Component Value Date   CREATININE 2.90 (H) 07/06/2020   CREATININE 3.35 (H) 07/05/2020   CREATININE 3.79 (H) 07/04/2020     #  Anemia of renal failure Lab Results  Component Value Date   HGB 6.7 (L) 07/06/2020   Dyspnea Epogen 10,000 units weekly May consider blood transfusion if hemoglobin stays low  # hyponatremia Sodium normalized to 135 today   # obstructive uropathy with pyelonephritis Patient is status post right ureteral stent placement by Dr. Jeffie Pollock on September 18 Patient with good urine output.   #Small bowel obstruction Status post ex lap on September 30 for takedown of adhesions.  Small bowel was completely stuck and fused to the anterior abdominal wall.  Extensive lysis of adhesions was conducted.  Denies nausea, tolerated soft diet today TPN discontinued Management per surgery team      LOS: [place] 10/6/202112:41 PM

## 2020-07-07 DIAGNOSIS — A419 Sepsis, unspecified organism: Secondary | ICD-10-CM | POA: Diagnosis not present

## 2020-07-07 DIAGNOSIS — N10 Acute tubulo-interstitial nephritis: Secondary | ICD-10-CM | POA: Diagnosis not present

## 2020-07-07 DIAGNOSIS — D6869 Other thrombophilia: Secondary | ICD-10-CM | POA: Diagnosis not present

## 2020-07-07 DIAGNOSIS — N179 Acute kidney failure, unspecified: Secondary | ICD-10-CM | POA: Diagnosis not present

## 2020-07-07 LAB — CBC WITH DIFFERENTIAL/PLATELET
Abs Immature Granulocytes: 0.05 10*3/uL (ref 0.00–0.07)
Basophils Absolute: 0 10*3/uL (ref 0.0–0.1)
Basophils Relative: 0 %
Eosinophils Absolute: 0.1 10*3/uL (ref 0.0–0.5)
Eosinophils Relative: 1 %
HCT: 27 % — ABNORMAL LOW (ref 36.0–46.0)
Hemoglobin: 8.7 g/dL — ABNORMAL LOW (ref 12.0–15.0)
Immature Granulocytes: 1 %
Lymphocytes Relative: 7 %
Lymphs Abs: 0.7 10*3/uL (ref 0.7–4.0)
MCH: 28.2 pg (ref 26.0–34.0)
MCHC: 32.2 g/dL (ref 30.0–36.0)
MCV: 87.4 fL (ref 80.0–100.0)
Monocytes Absolute: 0.8 10*3/uL (ref 0.1–1.0)
Monocytes Relative: 9 %
Neutro Abs: 7.7 10*3/uL (ref 1.7–7.7)
Neutrophils Relative %: 82 %
Platelets: 84 10*3/uL — ABNORMAL LOW (ref 150–400)
RBC: 3.09 MIL/uL — ABNORMAL LOW (ref 3.87–5.11)
RDW: 17.3 % — ABNORMAL HIGH (ref 11.5–15.5)
WBC: 9.4 10*3/uL (ref 4.0–10.5)
nRBC: 0 % (ref 0.0–0.2)

## 2020-07-07 LAB — TYPE AND SCREEN
ABO/RH(D): O POS
Antibody Screen: NEGATIVE
Unit division: 0

## 2020-07-07 LAB — GLUCOSE, CAPILLARY
Glucose-Capillary: 191 mg/dL — ABNORMAL HIGH (ref 70–99)
Glucose-Capillary: 224 mg/dL — ABNORMAL HIGH (ref 70–99)
Glucose-Capillary: 172 mg/dL — ABNORMAL HIGH (ref 70–99)
Glucose-Capillary: 150 mg/dL — ABNORMAL HIGH (ref 70–99)

## 2020-07-07 LAB — BASIC METABOLIC PANEL
Anion gap: 10 (ref 5–15)
BUN: 88 mg/dL — ABNORMAL HIGH (ref 8–23)
CO2: 24 mmol/L (ref 22–32)
Calcium: 7.8 mg/dL — ABNORMAL LOW (ref 8.9–10.3)
Chloride: 102 mmol/L (ref 98–111)
Creatinine, Ser: 2.74 mg/dL — ABNORMAL HIGH (ref 0.44–1.00)
GFR calc non Af Amer: 16 mL/min — ABNORMAL LOW (ref >60)
Glucose, Bld: 213 mg/dL — ABNORMAL HIGH (ref 70–99)
Potassium: 3.9 mmol/L (ref 3.5–5.1)
Sodium: 136 mmol/L (ref 135–145)

## 2020-07-07 LAB — BPAM RBC
Blood Product Expiration Date: 202110312359
ISSUE DATE / TIME: 202110061436
Unit Type and Rh: 5100

## 2020-07-07 NOTE — Progress Notes (Signed)
Physical Therapy Treatment Patient Details Name: Stephanie [name] MRN: [id number] DOB: 05/02/1941 Today's Date: 07/07/2020    History of Present Illness Stephanie [name] is a 79 y/o F w/ PMH: COVID infection, DM, RA, HTN, remote colon CA, who comes to [facility] on 9/17. Patient [name] to have klebsiella sepsis and had right sided pyelonephritis. She underwent right ureteral stent placed on on 9/18. Pt with acute on chronic renal failure. Temporary fem cath placed 9/27, removed on 10/6. Pt also with SBO requiring TPN so R IJ placed 9/27 as well. Pt s/p exploratory lap and lysis of adhesions 9/30 with continue at transfer order in place. Her femoral catheter removed 07/06/20.    PT Comments    Patient presents in bed on arrival to room and is agreeable to PT session. After communicating with the doctor, the nurse came in and checked the bandage site for bleeding. She is Stephanie longer restricted to bed level exercises and able to move freely. When moving towards the EOB, patient is very dizzy and describes it as the room is spinning. She dry heaved and eventually threw up. After multiple breaks, she made it EOB but unable to support herself sitting up. Pt is very dizzy and weak. She is max+2 for sit to supine transfer and repositioning in bed. She is encouraged to continue exercises throughout her day, especially the flutter vlaves and IS once every hour. Pt will benefit from skilled PT services to address deficits in strength, balance, endurance and decrease risk for future falls.   Follow Up Recommendations  SNF;Supervision for mobility/OOB;Supervision - Intermittent     Equipment Recommendations  None recommended by PT    Recommendations for Other Services       Precautions / Restrictions Precautions Precautions: Fall Restrictions Weight Bearing Restrictions: Stephanie    Mobility  Bed Mobility Overal bed mobility: Needs Assistance Bed Mobility: Sit to Supine;Supine to Sit     Supine to sit: Mod assist;+2 for  physical assistance Sit to supine: Max assist;+2 for physical assistance   General bed mobility comments: Pt able to sit EOB, but took increased time and patient very dizzy and took multiple breaks before arriving EOB. On return to bed, patient is maxA+2 as she is very weak.   Transfers                 General transfer comment: Did not perform due to patient weak  Ambulation/Gait             General Gait Details: Did not perform due to patient being very weak   Marine scientist [place] (Stroke Patients Only)       Balance Overall balance assessment: Needs assistance Sitting-balance support: Bilateral upper extremity supported;Stephanie upper extremity supported Sitting balance-Leahy Scale: Poor Sitting balance - Comments: Patient unable to maintain balance due to weakness and dizziness. Pt demonstrated posterior lean when not being supported. Patient also demonstrates R lateral lean on PTs arm due to being tired. Postural control: Posterior lean     Standing balance comment: Did not assess standing balance this session                            Cognition Arousal/Alertness: Awake/alert Behavior During Therapy: WFL for tasks assessed/performed Overall Cognitive Status: Within Functional Limits for tasks assessed  General Comments: Patient is HOH and requires some increased processing time      Exercises General Exercises - Lower Extremity Ankle Circles/Pumps: AROM;Both;10 reps;Supine Quad Sets: AROM;Both;10 reps;Supine Other Exercises Other Exercises: Flutter breather x 10, ~250-300 mL with IS x 10 breaths    General Comments General comments (skin integrity, edema, etc.): Patient very dizzy throughout session as it was first time sitting up. She described the dizziness as room spinning. She was driving heaving and threw-up due to dizziness.      Pertinent  Vitals/Pain Pain Assessment: Faces Faces Pain Scale: Hurts even more Pain Location: States her stomach "doesn't feel good". She notes all the food is coming in and nothing is coming out. Pain Descriptors / Indicators: Sore;Discomfort Pain Intervention(s): Monitored during session;Limited activity within patient's tolerance    Home Living                      Prior Function            PT Goals (current goals can now be [name] in the care plan section) Acute Rehab PT Goals Patient Stated Goal: go home PT Goal Formulation: With patient Time For Goal Achievement: 07/21/20 Potential to Achieve Goals: Fair Progress towards PT goals: Progressing toward goals    Frequency    Min 2X/week      PT Plan Current plan remains appropriate    Co-evaluation              AM-PAC PT "6 Clicks" Mobility   Outcome Measure  Help needed turning from your back to your side while in a flat bed without using bedrails?: A Lot Help needed moving from lying on your back to sitting on the side of a flat bed without using bedrails?: A Lot Help needed moving to and from a bed to a chair (including a wheelchair)?: Total Help needed standing up from a chair using your arms (e.g., wheelchair or bedside chair)?: Total Help needed to walk in hospital room?: Total Help needed climbing 3-5 steps with a railing? : Total 6 Click Score: 8    End of Session   Activity Tolerance: Patient limited by fatigue;Patient limited by pain Patient left: in bed;with call bell/phone within reach;with bed alarm set;with family/visitor present Nurse Communication: Mobility status PT Visit Diagnosis: Other abnormalities of gait and mobility (R26.89);Muscle weakness (generalized) (M62.81);Dizziness and giddiness (R42);Difficulty in walking, not elsewhere classified (R26.2)     Time: 8891-6945 PT Time Calculation (min) (ACUTE ONLY): 38 min  Charges:  $Therapeutic Exercise: 23-37 mins $Therapeutic Activity:  8-22 mins                       Noemi Chapel, SPT Bernita Raisin 07/07/2020, 5:24 PM

## 2020-07-07 NOTE — TOC Progression Note (Signed)
Transition of Care [facility]) - Progression Note    Patient Details  Name: Stephanie [name] MRN: [id number] Date of Birth: May 12, 1941  Transition of Care [facility]) CM/SW [place], LCSW Phone Number: 07/07/2020, 10:04 AM  Clinical Narrative: Per MD, hoping to discharge to [facility] in the next 48 hours as long as PO intake has improved. CSW called and notified admissions coordinator. Patient has been vaccinated so she will not need a new COVID test.  Expected Discharge Plan: [place] Barriers to Discharge: Continued Medical Work up  Expected Discharge Plan and Services Expected Discharge Plan: [place] In-house Referral: NA   Post Acute Care Choice: NA Living arrangements for the past 2 months: Single Family Home                                       Social Determinants of Health (SDOH) Interventions    Readmission Risk Interventions Readmission Risk Prevention Plan 06/21/2020 06/19/2020  Transportation Screening Complete Complete  PCP or Specialist Appt within 3-5 Days Complete -  HRI or Home Care Consult Complete -  Social Work Consult for [place] Planning/Counseling Complete -  Palliative Care Screening Not Applicable -  Medication Review Press photographer) (No Data) Complete  PCP or Specialist appointment within 3-5 days of discharge - Complete  HRI or [place] - Complete  SW Recovery Care/Counseling Consult - Complete  [place] - Not Applicable  Some recent data might be hidden

## 2020-07-07 NOTE — Anesthesia Postprocedure Evaluation (Signed)
Anesthesia Post Note  Patient: VAISHALI BAISE  Procedure(s) Performed: CYSTOSCOPY WITH STENT PLACEMENT (Right )  Patient location during evaluation: PACU Anesthesia Type: General Level of consciousness: awake and alert Pain management: pain level controlled Vital Signs Assessment: post-procedure vital signs reviewed and stable Respiratory status: spontaneous breathing, nonlabored ventilation and respiratory function stable Cardiovascular status: blood pressure returned to baseline and stable Postop Assessment: no apparent nausea or vomiting Anesthetic complications: no   No complications documented.   Last Vitals:  Vitals:   07/07/20 0059 07/07/20 0454  BP: (!) 157/72 (!) 152/69  Pulse: 76 74  Resp: 20 20  Temp: 36.9 C 37 C  SpO2: 96% 95%    Last Pain:  Vitals:   07/07/20 0454  TempSrc: Oral  PainSc:                  Alphonsus Sias

## 2020-07-07 NOTE — Progress Notes (Signed)
[facility] Day(s): 20.   Post op day(s): 7 Days Post-Op.   Interval History:  Patient seen and examined no acute events or new complaints overnight.  Patient reports she is feeling pretty good this morning No abdominal pain, distension, nausea, emesis Hgb improved to 8.0 yesterday following 1 unit pRBCs No new labs otherwise Continues to have good renal function, UO - 2.7 Advanced to soft diet, tolerating well, multiple BMs yesterday Working with therapies; recommending SNF  Vital signs in last 24 hours: [min-max] current  Temp:  [97.8 F (36.6 C)-99.2 F (37.3 C)] 98.6 F (37 C) (10/07 0454) Pulse Rate:  [74-84] 74 (10/07 0454) Resp:  [16-20] 20 (10/07 0454) BP: (139-157)/(54-72) 152/69 (10/07 0454) SpO2:  [95 %-100 %] 95 % (10/07 0454) Weight:  [93 kg] 93 kg (10/07 0500)     Height: 5\' 6"  (167.6 cm) Weight: 93 kg BMI (Calculated): 33.11   Intake/Output last 2 shifts:  10/06 0701 - 10/07 0700 In: 1401.5 [P.O.:477; I.V.:546.5; Blood:378] Out: 2700 [Urine:2700]   Physical Exam:  Constitutional: alert, cooperative and no distress HENT: normocephalic without obvious abnormality Eyes: PERRL, EOM's grossly intact and symmetric  Respiratory: breathing non-labored at rest  Cardiovascular: regular rate and sinus rhythm  Gastrointestinal:Soft,no appreciable soreness,distension nearly resolved, bowel sounds audible without direct ausculation, certainly no rebound/guarding/peritonitis Integumentary: Laparotomy incision is CDI with staples,no erythema Musculoskeletal:Marked edema, +2, to bilateral LE  Labs:  CBC Latest Ref Rng & Units 07/06/2020 07/06/2020 07/05/2020  WBC 4.0 - 10.5 K/uL - 7.9 8.4  Hemoglobin 12.0 - 15.0 g/dL 8.0(L) 6.7(L) 8.1(L)  Hematocrit 36 - 46 % 24.4(L) 21.1(L) 23.8(L)  Platelets 150 - 400 K/uL - 75(L) 83(L)   CMP Latest Ref Rng & Units 07/06/2020 07/05/2020 07/04/2020  Glucose 70 - 99 mg/dL 115(H) 167(H)  310(H)  BUN 8 - 23 mg/dL 98(H) 99(H) 94(H)  Creatinine 0.44 - 1.00 mg/dL 2.90(H) 3.35(H) 3.79(H)  Sodium 135 - 145 mmol/L 135 134(L) 132(L)  Potassium 3.5 - 5.1 mmol/L 3.7 4.0 4.1  Chloride 98 - 111 mmol/L 102 100 97(L)  CO2 22 - 32 mmol/L 25 25 24   Calcium 8.9 - 10.3 mg/dL 8.0(L) 8.2(L) 8.3(L)  Total Protein 6.5 - 8.1 g/dL - - 4.8(L)  Total Bilirubin 0.3 - 1.2 mg/dL - - 0.5  Alkaline Phos 38 - 126 U/L - - 111  AST 15 - 41 U/L - - 12(L)  ALT 0 - 44 U/L - - 9     Imaging studies: No new pertinent imaging studies   Assessment/Plan:  79 y.o. female doing well with return of bowel function 7 Days Post-Op s/p exploratory laparotomy and lysis of adhesionsfor partial small bowel obstruction secondary to post-surgical adhesions, complicated by pertinent comorbidities includingdebilitation, numerousmedicalconditionsnow with ESRD.   - Continue soft diet this morning + nutritional supplementation  - Monitor abdominal examination; on-going bowel function - Pain control prn; antiemetics prn - appreciate nephrologyand cardiology consultationsand recommendations - Mobilization encouraged;PT following --> recommendations for SNF - Further management per primary team   - Discharge Planning: Nothing further from surgical standpoint as diet advanced, off TPN, having bowel function. Discharge to SNF once medically cleared. If she remains here on Monday (10/11), I will come by and remove staples. I will arrange surgical follow up  All of the above findings and recommendations were discussed with the patient, and the medical team, and all of patient's questions were answered toherexpressed satisfaction.  -- Edison Simon, PA-C [facility]  07/07/2020, 7:25 AM [phone] M-F: 7am - 4pm

## 2020-07-07 NOTE — Care Management Important Message (Signed)
Important Message  Patient Details  Name: Stephanie [name] MRN: [id number] Date of Birth: [birth year]   Medicare Important Message Given:  Yes     Dannette Barbara 07/07/2020, 2:04 PM

## 2020-07-07 NOTE — Progress Notes (Signed)
[facility]  ROUNDING NOTE   Subjective:   Patient found sitting up in bed, consuming her breakfast,reports fair appetite.No nausea or vomiting.No SOB. Urine output 2700 ml for the preceding 24 hours. Rt.Femoral catheter for temporary dialysis access, discontinued yesterday, site with dressing intact.  10/06 0701 - 10/07 0700 In: 1401.5 [P.O.:477; I.V.:546.5; Blood:378] Out: 2700 [Urine:2700]   Objective:  Vital signs in last 24 hours:  Temp:  [97.9 F (36.6 C)-99.2 F (37.3 C)] 98.7 F (37.1 C) (10/07 1139) Pulse Rate:  [74-84] 75 (10/07 1139) Resp:  [16-20] 16 (10/07 1139) BP: (121-167)/(54-72) 167/72 (10/07 1139) SpO2:  [95 %-100 %] 99 % (10/07 1139) Weight:  [93 kg] 93 kg (10/07 0500)  Weight change: -0.7 kg Filed Weights   06/22/20 0453 07/06/20 0500 07/07/20 0500  Weight: 86.8 kg 93.7 kg 93 kg    Intake/Output: I/O last 3 completed shifts: In: 1411.5 [P.O.:477; I.V.:556.5; Blood:378] Out: 5284 [Urine:4275]   Intake/Output this shift:  Total I/O In: 600 [P.O.:600] Out: -   Physical Exam: General:  in no acute distress,pleasant  Head:  Normocephalic, atraumatic  Lungs:   Normal and symmetric effort, Lungs with crackles at the bases  Heart:  S1-S2 no rubs or gallops  Abdomen:   Mildly Tenderness + on lower quadrants  Extremities:  2+ pitting edema on bilateral lower extremities  Neurologic:  Awake, lethargic, answers questions appropriately  Skin: No new rashes or lesions  Access: Rt fem temp dialysis  catheter placed on 1/32/44    Basic Metabolic Panel: Recent Labs  Lab 07/01/20 0526 07/01/20 0526 07/02/20 0513 07/02/20 0513 07/03/20 0430 07/03/20 0430 07/04/20 0102 07/04/20 0607 07/05/20 0449 07/06/20 0533 07/07/20 0909  NA 133*   132*   < > 135   135   < > 138  --  132*  --  134* 135 136  K 4.1   4.1   < > 4.3   4.3   < > 4.5  --  4.1  --  4.0 3.7 3.9  CL 97*   96*   < > 98   98   < > 101  --  97*  --  100 102 102  CO2 27   26    < > 27   27   < > 31  --  24  --  25 25 24   GLUCOSE 371*   367*   < > 294*   298*   < > 257*  --  310*  --  167* 115* 213*  BUN 74*   74*   < > 86*   85*   < > 91*  --  94*  --  99* 98* 88*  CREATININE 4.50*   4.84*   < > 4.79*   4.72*   < > 4.38*  --  3.79*  --  3.35* 2.90* 2.74*  CALCIUM 7.8*   7.7*   < > 8.5*   8.4*   < > 8.3*   < > 8.3*   < > 8.2* 8.0* 7.8*  MG 2.0   < > 2.0  --  1.9  --  1.7  --  2.1 2.0  --   PHOS 4.6  --  3.3  --   --   --  2.1*  --  2.0* 2.6  --    < > = values in this interval not displayed.    Liver Function Tests: Recent Labs  Lab 07/01/20 0526 07/02/20 0513 07/04/20  6789  AST  --   --  12*  ALT  --   --  9  ALKPHOS  --   --  111  BILITOT  --   --  0.5  PROT  --   --  4.8*  ALBUMIN 1.9* 2.0* 1.9*   No results for input(s): LIPASE, AMYLASE in the last 168 hours. No results for input(s): AMMONIA in the last 168 hours.  CBC: Recent Labs  Lab 07/03/20 0430 07/03/20 0430 07/04/20 0607 07/05/20 0449 07/06/20 0533 07/06/20 1940 07/07/20 0909  WBC 11.3*  --  8.1 8.4 7.9  --  9.4  NEUTROABS  --   --  6.7  --   --   --  7.7  HGB 8.5*   < > 8.5* 8.1* 6.7* 8.0* 8.7*  HCT 25.8*   < > 25.4* 23.8* 21.1* 24.4* 27.0*  MCV 85.7  --  85.2 85.3 88.7  --  87.4  PLT 111*  --  85* 83* 75*  --  84*   < > = values in this interval not displayed.    Cardiac Enzymes: No results for input(s): CKTOTAL, CKMB, CKMBINDEX, TROPONINI in the last 168 hours.  BNP: Invalid input(s): POCBNP  CBG: Recent Labs  Lab 07/06/20 0752 07/06/20 1143 07/06/20 1638 07/06/20 2137 07/07/20 0741  GLUCAP 92 142* 150* 114* 191*    Microbiology: Results for orders placed or performed during the hospital encounter of 06/17/20  Blood culture (routine x 2)     Status: Abnormal   Collection Time: 06/17/20  3:57 AM   Specimen: BLOOD  Result Value Ref Range Status   Specimen Description   Final    BLOOD LEFT HAND Performed at [facility], [address],  [place], [place] [address]    Special Requests   Final    BOTTLES DRAWN AEROBIC AND ANAEROBIC Blood Culture adequate volume Performed at [facility], [address] [place], [place] [address]    Culture  Setup Time   Final    Organism ID to follow GRAM NEGATIVE RODS IN BOTH AEROBIC AND ANAEROBIC BOTTLES CRITICAL RESULT CALLED TO, READ BACK BY AND VERIFIED WITH: AMY THOMPSON AT 1534 ON 06/17/20 [place] Performed at [facility], [address] [place], [place] [address]    Culture KLEBSIELLA PNEUMONIAE (A)  Final   Report Status 06/19/2020 FINAL  Final   Organism ID, Bacteria KLEBSIELLA PNEUMONIAE  Final      Susceptibility   Klebsiella pneumoniae - MIC*    AMPICILLIN RESISTANT Resistant     CEFAZOLIN <=4 SENSITIVE Sensitive     CEFEPIME <=0.12 SENSITIVE Sensitive     CEFTAZIDIME <=1 SENSITIVE Sensitive     CEFTRIAXONE <=0.25 SENSITIVE Sensitive     CIPROFLOXACIN <=0.25 SENSITIVE Sensitive     GENTAMICIN <=1 SENSITIVE Sensitive     IMIPENEM <=0.25 SENSITIVE Sensitive     TRIMETH/SULFA <=20 SENSITIVE Sensitive     AMPICILLIN/SULBACTAM 4 SENSITIVE Sensitive     PIP/TAZO <=4 SENSITIVE Sensitive     * KLEBSIELLA PNEUMONIAE  Blood culture (routine x 2)     Status: Abnormal   Collection Time: 06/17/20  3:57 AM   Specimen: BLOOD  Result Value Ref Range Status   Specimen Description   Final    BLOOD RIGHT [facility] Performed at [facility], [address] [place], [place] [address]    Special Requests   Final    BOTTLES DRAWN AEROBIC AND ANAEROBIC Blood Culture adequate volume Performed at  [facility], [address], [place], [place] [address]    Culture  Setup Time   Final    GRAM NEGATIVE RODS IN BOTH AEROBIC AND ANAEROBIC BOTTLES CRITICAL VALUE NOTED.  VALUE IS CONSISTENT WITH PREVIOUSLY REPORTED AND CALLED VALUE. Performed at [facility], [address] [place], [place] [address]    Culture (A)  Final    KLEBSIELLA  PNEUMONIAE SUSCEPTIBILITIES PERFORMED ON PREVIOUS CULTURE WITHIN THE LAST 5 DAYS. Performed at [facility], [place] [address] [place], [place] [address]    Report Status 06/19/2020 FINAL  Final  Blood Culture ID Panel (Reflexed)     Status: Abnormal   Collection Time: 06/17/20  3:57 AM  Result Value Ref Range Status   Enterococcus faecalis NOT DETECTED NOT DETECTED Final   Enterococcus Faecium NOT DETECTED NOT DETECTED Final   Listeria monocytogenes NOT DETECTED NOT DETECTED Final   Staphylococcus species NOT DETECTED NOT DETECTED Final   Staphylococcus aureus (BCID) NOT DETECTED NOT DETECTED Final   Staphylococcus epidermidis NOT DETECTED NOT DETECTED Final   Staphylococcus lugdunensis NOT DETECTED NOT DETECTED Final   Streptococcus species NOT DETECTED NOT DETECTED Final   Streptococcus agalactiae NOT DETECTED NOT DETECTED Final   Streptococcus pneumoniae NOT DETECTED NOT DETECTED Final   Streptococcus pyogenes NOT DETECTED NOT DETECTED Final   A.calcoaceticus-baumannii NOT DETECTED NOT DETECTED Final   Bacteroides fragilis NOT DETECTED NOT DETECTED Final   Enterobacterales DETECTED (A) NOT DETECTED Final    Comment: Enterobacterales represent a large order of gram negative bacteria, not a single organism. CRITICAL RESULT CALLED TO, READ BACK BY AND VERIFIED WITH: AMY THOMPSON AT 2263 ON 06/17/20 SNG    Enterobacter cloacae complex NOT DETECTED NOT DETECTED Final   Escherichia coli NOT DETECTED NOT DETECTED Final   Klebsiella aerogenes NOT DETECTED NOT DETECTED Final   Klebsiella oxytoca NOT DETECTED NOT DETECTED Final   Klebsiella pneumoniae DETECTED (A) NOT DETECTED Final    Comment: CRITICAL RESULT CALLED TO, READ BACK BY AND VERIFIED WITH: AMY THOMPSON RN AT 3354 ON 06/17/20 SNG    Proteus species NOT DETECTED NOT DETECTED Final   Salmonella species NOT DETECTED NOT DETECTED Final   Serratia marcescens NOT DETECTED NOT DETECTED Final   Haemophilus influenzae NOT DETECTED  NOT DETECTED Final   Neisseria meningitidis NOT DETECTED NOT DETECTED Final   Pseudomonas aeruginosa NOT DETECTED NOT DETECTED Final   Stenotrophomonas maltophilia NOT DETECTED NOT DETECTED Final   Candida albicans NOT DETECTED NOT DETECTED Final   Candida auris NOT DETECTED NOT DETECTED Final   Candida glabrata NOT DETECTED NOT DETECTED Final   Candida krusei NOT DETECTED NOT DETECTED Final   Candida parapsilosis NOT DETECTED NOT DETECTED Final   Candida tropicalis NOT DETECTED NOT DETECTED Final   Cryptococcus neoformans/gattii NOT DETECTED NOT DETECTED Final   CTX-M ESBL NOT DETECTED NOT DETECTED Final   Carbapenem resistance IMP NOT DETECTED NOT DETECTED Final   Carbapenem resistance KPC NOT DETECTED NOT DETECTED Final   Carbapenem resistance NDM NOT DETECTED NOT DETECTED Final   Carbapenem resist OXA 48 LIKE NOT DETECTED NOT DETECTED Final   Carbapenem resistance VIM NOT DETECTED NOT DETECTED Final    Comment: Performed at [facility], [address], [place], [place] [address]  Urine Culture     Status: Abnormal   Collection Time: 06/17/20 11:01 AM   Specimen: Urine, Random  Result Value Ref Range Status   Specimen Description   Final    URINE, RANDOM  Performed at [facility], [address] [place], [place] [address]    Special Requests   Final    NONE Performed at [facility], [address] [place], [place] [address]    Culture >=100,000 COLONIES/mL KLEBSIELLA PNEUMONIAE (A)  Final   Report Status 06/19/2020 FINAL  Final   Organism ID, Bacteria KLEBSIELLA PNEUMONIAE (A)  Final      Susceptibility   Klebsiella pneumoniae - MIC*    AMPICILLIN RESISTANT Resistant     CEFAZOLIN <=4 SENSITIVE Sensitive     CEFTRIAXONE <=0.25 SENSITIVE Sensitive     CIPROFLOXACIN <=0.25 SENSITIVE Sensitive     GENTAMICIN <=1 SENSITIVE Sensitive     IMIPENEM <=0.25 SENSITIVE Sensitive     NITROFURANTOIN 32 SENSITIVE Sensitive     TRIMETH/SULFA <=20  SENSITIVE Sensitive     AMPICILLIN/SULBACTAM 4 SENSITIVE Sensitive     PIP/TAZO <=4 SENSITIVE Sensitive     * >=100,000 COLONIES/mL KLEBSIELLA PNEUMONIAE  Culture, blood (Routine X 2) w Reflex to ID Panel     Status: None   Collection Time: 06/21/20 11:07 AM   Specimen: BLOOD  Result Value Ref Range Status   Specimen Description BLOOD RIGHT [facility]  Final   Special Requests   Final    BOTTLES DRAWN AEROBIC AND ANAEROBIC Blood Culture adequate volume   Culture   Final    NO GROWTH 5 DAYS Performed at [facility], [address], [place], [place] [address]    Report Status 06/26/2020 FINAL  Final  Culture, blood (Routine X 2) w Reflex to ID Panel     Status: None   Collection Time: 06/21/20 11:09 AM   Specimen: BLOOD  Result Value Ref Range Status   Specimen Description BLOOD [place]  Final   Special Requests   Final    BOTTLES DRAWN AEROBIC AND ANAEROBIC Blood Culture adequate volume   Culture   Final    NO GROWTH 5 DAYS Performed at [facility], [address] [place],  [address]    Report Status 06/26/2020 FINAL  Final    Coagulation Studies: No results for input(s): LABPROT, INR in the last 72 hours.  Urinalysis: No results for input(s): COLORURINE, LABSPEC, PHURINE, GLUCOSEU, HGBUR, BILIRUBINUR, KETONESUR, PROTEINUR, UROBILINOGEN, NITRITE, LEUKOCYTESUR in the last 72 hours.  Invalid input(s): APPERANCEUR    Imaging: No results found.   Medications:    sodium chloride 0 mL (06/27/20 1630)    vitamin C  500 mg Oral Daily   brimonidine  1 drop Both Eyes BID   chlorhexidine  15 mL Mouth Rinse BID   Chlorhexidine Gluconate Cloth  6 each Topical Q0600   diltiazem  120 mg Oral Daily   epoetin (EPOGEN/PROCRIT) injection  10,000 Units Subcutaneous Weekly   feeding supplement (ENSURE ENLIVE)  237 mL Oral BID BM   insulin aspart  0-15 Units Subcutaneous TID AC & HS   mouth rinse  15 mL Mouth Rinse BID   metoprolol tartrate  50 mg  Oral BID   pantoprazole (PROTONIX) IV  40 mg Intravenous Q24H   sodium chloride flush  10-40 mL Intracatheter Q12H   tamsulosin  0.4 mg Oral Daily   zinc sulfate  220 mg Oral Daily   sodium chloride, acetaminophen, albuterol, guaiFENesin-dextromethorphan, heparin, labetalol, morphine injection, ondansetron (ZOFRAN) IV  Assessment/ Plan:  Ms. Stephanie [name] is a 79 y.o.  female admitted to [facility] for COVID pneumonia. Patient found to have klebsiella sepsis and had right sided pyelonephritis. She  underwent right ureteral stent placed on on 9/18.  Patient had IV contrast exposure on 9/17.   # AKI secondary to ATN Patient has AKI, likely secondary to sepsis/obstructive uropathy/did have IV contrast exposure/hypotension. BASELINE Cr 0.90 in January 2021 Received dialysis 1st treatment 06/28/2020 Urine output stays excellent 2700 ml for the past 24 hours Creatinine improving, down to 2.74 today    Lab Results  Component Value Date   CREATININE 2.74 (H) 07/07/2020   CREATININE 2.90 (H) 07/06/2020   CREATININE 3.35 (H) 07/05/2020     # Anemia of renal failure Lab Results  Component Value Date   HGB 8.7 (L) 07/07/2020   Maintain the patient on Epogen 10,000 subcutaneous weekly.  # hyponatremia Na+ normalized, 136 today Will continue monitoring    # obstructive uropathy with pyelonephritis Patient is status post right ureteral stent placement by Dr. Jeffie Pollock on September 18 F/C draining clear yellow output   #Small bowel obstruction Status post ex lap on September 30 for takedown of adhesions.  Small bowel was completely stuck and fused to the anterior abdominal wall.  Extensive lysis of adhesions was conducted.   Patient tolerating soft diet,receiving additional nutritional supplements Continue management as per GS team       LOS: [place] 10/7/202111:42 AM

## 2020-07-07 NOTE — Progress Notes (Signed)
PROGRESS NOTE    Stephanie HOESCHEN  DHR:[id number] DOB: March 16, 1941 DOA: 06/17/2020 PCP: Rusty Aus, MD  Brief Narrative:  Stephanie [name] is a 79 year old female with past medical history notable for type 2 diabetes mellitus, rheumatoid arthritis on chronic immunosuppressive therapy, essential hypertension, history of remote colon cancer and Covid-19 viral infection with positive PCR test June 09, 2020 who presented to the ED with with complaint of fever, progressive shortness of breath, and generalized weakness.  10/6: Postop day 6.  Recovering well overall however had some acute anemia noted this morning.  Unclear source of blood loss.  Patient transfused 1 unit packed red cells.  Hemoglobin responded to transfusion appropriately.  Had some bleeding around femoral catheter site when it was discontinued.  Pressure dressing applied.  Site clean dry and intact.    Assessment & Plan:   Principal Problem:   Sepsis ([place]) Active Problems:   Benign essential hypertension   Chronic cystitis   Rheumatoid arthritis involving multiple sites with positive rheumatoid factor (HCC)   Diabetes mellitus (HCC)   Rheumatoid arthritis ([place])   COVID-19 virus infection   AKI (acute kidney injury) ([place])   Atrial fibrillation with rapid ventricular response (HCC)   Essential hypertension   Pure hypercholesterolemia   Acute pyelonephritis   Hydronephrosis, right   Lactic acid acidosis   Hyperglycemia   Hx of essential hypertension   Immunosuppression due to drug therapy ([place])   Pressure injury of skin   Acquired thrombophilia ([place])   On continuous oral anticoagulation   SBO (small bowel obstruction) (HCC)   Goals of care, counseling/discussion   Palliative care by specialist   CKD (chronic kidney disease) stage 5, GFR less than 15 ml/min (HCC)   Ileus (HCC)   Paroxysmal A-fib (HCC)  Sepsis, present on admission Klebsiella pneumonia septicemia, POA Right pyelonephritis Patient presenting  with fever, T-max 103.0 with associated tachycardia, tachypnea with a WBC count of 20 K with a left shift and lactic acid of 3.1.  Underwent right ureter stent placement by urology with frank pus noted to kidney.  And blood cultures x2 positive for Klebsiella pneumonia. WBC count improved from 20K to 7.5.  Completed 10-day course of IV antibiotics with Zosyn followed by ceftriaxone followed by ciprofloxacin.  Blood cultures 06/21/2020 negative. --Continue monitor fever curve, supportive care  Severe right hydronephrosis CT renal notable for progressive right UPJ obstruction in comparison to prior study dated October 2020.  Urology was consulted and right stent placed with findings of frank pus in the kidney by Dr. Jeffie Pollock on 06/18/2020.  Follow-up renal ultrasound 06/20/2020 with resolved hydronephrosis. --Continue Foley catheter per urology, Dr. Jeffie Pollock --Continue strict I's and O's  Acute renal failure Patient presenting above with severe right hydronephrosis with associated sepsis from Klebsiella pneumonia septicemia/pyelonephritis.  Baseline creatinine 0.90 January 2021. Creatinine 1.57 on admission, trended up to a high of 6.33 during admission.  Etiology likely secondary to ATN from severe infection as above. --Nephrology following, appreciate assistance --Underwent placement of temporary HD catheter to right common femoral vein by vascular surgery on 06/27/2020.  --Started on dialysis 9/28; right femoral PermCath removed 07/06/2020.  No further plans for inpatient hemodialysis --Cr 6.33>>4.38>3.79>3.35>2.90>2.74 --Avoid nephrotoxins, renally dose all medications --Closely monitor urinary output --Follow BMP daily  Acute postoperative blood loss anemia Hemoglobin dropped from 8.2 to 7.1 postoperatively on 07/01/2020.  Transfused 1 unit PRBC on 07/01/2020.    Transfuse additional unit PRBC on 07/06/2020 . iron panel with iron 34, TIBC 195, ferritin  605, consistent with anemia of chronic  disease. --Hgb 8.2>7.1>9.1>8.8>8.5>8.1>6.7>8.0>8.7 --Goal maintain hemoglobin greater than 8.0 given cardiac history Plan: Daily H&H   Hypokalemia Hypomagnesemia Replete as necessary --Goal maintain K 4.0 and Mg 2.0 given card hx and afib --Follow electrolytes daily --TPN discontinued  Small bowel obstruction CT abdomen/pelvis with oral contrast notable for high-grade small bowel obstruction with abrupt transition point central lower abdomen, likely secondary to adhesions.  Underwent exploratory laparotomy on 06/30/2020 by Dr. Dahlia Byes with extensive lysis of adhesions. --NG tube discontinued 10/3 --TPN discontinued on 07/07/2020 --Diet advanced to  soft diet by general surgery on 10/6.   --Continue to monitor bowel movements closely --Further per general surgery --Once patient is able to tolerate at least 50% of her meals we can plan on discharge to skilled nursing facility  Paroxysmal atrial fibrillation with RVR --Cardiology following, appreciate assistance --CHA2DS2-VASc score = 5 (HTN, age, T2DM, gender) --Metoprolol tartrate 50 mg p.o. twice daily --Cardizem CD 120 mg daily --Discontinued heparin drip 2/2 bleeding surrounding HD catheter and thrombocytopenia, will need to resume when able  --continue to monitor on telemetry -Follow cardiology recs for A. fib management  GERD: Protonix 40 mg IV every 24 hours  Pressure injury of buttock, stage I, POA --Continue local wound care, offloading  Hx COVID-19 viral infection Patient fully vaccinated, received Pfizer vaccine on 10/16/2019 and 11/07/2019.  Covid-19 PCR positive on 06/09/2020.  No overt respiratory symptoms.  CT chest without acute pulmonary abnormality.  Oxygenating well on room air.  Completed 5-day course of IV remdesivir. --Discontinued airborne/contact isolation since greater than 21 days since initial diagnosis  History of rheumatoid arthritis Hold leflunomide and topical tacrolimus on hold given acute  issues Will likely restart those within the next 24 hours or before discharge at the latest.   DVT prophylaxis: SCD Code Status: DNR Family Communication: Husband Jimmy via phone [phone] on 07/07/2020 Disposition Plan: Status is: Inpatient  Remains inpatient appropriate because:Inpatient level of care appropriate due to severity of illness   Dispo:  Patient From: Home  Planned Disposition: [place]  Expected discharge date: 3 days  Medically stable for discharge: No   Patient remains inpatient appropriate.  TPN discontinued.  Diet advanced to soft.  No further surgical interventions.  We will plan to monitor p.o. intake over the next 24 to 48 hours.  If starting to improve and patient tolerating without nausea or vomiting we will plan on discharge to skilled nursing facility within the next 2 to 3 days.       Consultants:   Cardiology, nephrology, surgery  Procedures:   Ex lap with lysis of adhesions  Antimicrobials:   None   Subjective: Seen and examined.  No pain complaints this morning.  Tamela Oddi is some fatigue.  Objective: Vitals:   07/07/20 0727 07/07/20 0735 07/07/20 1139 07/07/20 1155  BP: 121/72 (!) 151/67 (!) 167/72   Pulse:  80 75   Resp:  18 16   Temp: 97.9 F (36.6 C) 98.2 F (36.8 C) 98.7 F (37.1 C)   TempSrc: Oral Oral Oral   SpO2: 99% 95% 99% 98%  Weight:      Height:        Intake/Output Summary (Last 24 hours) at 07/07/2020 1301 Last data filed at 07/07/2020 1020 Gross per 24 hour  Intake 2001.51 ml  Output 2700 ml  Net -698.49 ml   Filed Weights   06/22/20 0453 07/06/20 0500 07/07/20 0500  Weight: 86.8 kg 93.7 kg 93 kg  Examination:  General exam: No acute distress.  Appears chronically ill Respiratory system: Poor respiratory effort.  Bibasilar crackles.  Normal work of breathing.  Room air  cardiovascular system: S1-S2 heard, regular rate, irregular rhythm, no murmurs Gastrointestinal system: Soft,  nondistended, mild tender to palpation suprapubic region.  Normal bowel sounds. Central nervous system: Alert, oriented x2.  No focal deficits Extremities: Diffusely decreased power in extremities bilaterally Skin: No rashes, lesions or ulcers Psychiatry: Judgment and insight appear poor.  Mood and affect normal    Data Reviewed: I have personally reviewed following labs and imaging studies  CBC: Recent Labs  Lab 07/03/20 0430 07/03/20 0430 07/04/20 0607 07/05/20 0449 07/06/20 0533 07/06/20 1940 07/07/20 0909  WBC 11.3*  --  8.1 8.4 7.9  --  9.4  NEUTROABS  --   --  6.7  --   --   --  7.7  HGB 8.5*   < > 8.5* 8.1* 6.7* 8.0* 8.7*  HCT 25.8*   < > 25.4* 23.8* 21.1* 24.4* 27.0*  MCV 85.7  --  85.2 85.3 88.7  --  87.4  PLT 111*  --  85* 83* 75*  --  84*   < > = values in this interval not displayed.   Basic Metabolic Panel: Recent Labs  Lab 07/01/20 0526 07/01/20 0526 07/02/20 0513 07/02/20 0513 07/03/20 0430 07/04/20 0607 07/05/20 0449 07/06/20 0533 07/07/20 0909  NA 133*  132*   < > 135  135   < > 138 132* 134* 135 136  K 4.1  4.1   < > 4.3  4.3   < > 4.5 4.1 4.0 3.7 3.9  CL 97*  96*   < > 98  98   < > 101 97* 100 102 102  CO2 27  26   < > 27  27   < > 31 24 25 25 24   GLUCOSE 371*  367*   < > 294*  298*   < > 257* 310* 167* 115* 213*  BUN 74*  74*   < > 86*  85*   < > 91* 94* 99* 98* 88*  CREATININE 4.50*  4.84*   < > 4.79*  4.72*   < > 4.38* 3.79* 3.35* 2.90* 2.74*  CALCIUM 7.8*  7.7*   < > 8.5*  8.4*   < > 8.3* 8.3* 8.2* 8.0* 7.8*  MG 2.0   < > 2.0  --  1.9 1.7 2.1 2.0  --   PHOS 4.6  --  3.3  --   --  2.1* 2.0* 2.6  --    < > = values in this interval not displayed.   GFR: Estimated Creatinine Clearance: 19.8 mL/min (A) (by C-G formula based on SCr of 2.74 mg/dL (H)). Liver Function Tests: Recent Labs  Lab 07/01/20 0526 07/02/20 0513 07/04/20 0607  AST  --   --  12*  ALT  --   --  9  ALKPHOS  --   --  111  BILITOT  --   --  0.5  PROT   --   --  4.8*  ALBUMIN 1.9* 2.0* 1.9*   No results for input(s): LIPASE, AMYLASE in the last 168 hours. No results for input(s): AMMONIA in the last 168 hours. Coagulation Profile: No results for input(s): INR, PROTIME in the last 168 hours. Cardiac Enzymes: No results for input(s): CKTOTAL, CKMB, CKMBINDEX, TROPONINI in the last 168 hours. BNP (last 3 results) No results for input(s): PROBNP  in the last 8760 hours. HbA1C: No results for input(s): HGBA1C in the last 72 hours. CBG: Recent Labs  Lab 07/06/20 1143 07/06/20 1638 07/06/20 2137 07/07/20 0741 07/07/20 1137  GLUCAP 142* 150* 114* 191* 224*   Lipid Profile: No results for input(s): CHOL, HDL, LDLCALC, TRIG, CHOLHDL, LDLDIRECT in the last 72 hours. Thyroid Function Tests: No results for input(s): TSH, T4TOTAL, FREET4, T3FREE, THYROIDAB in the last 72 hours. Anemia Panel: No results for input(s): VITAMINB12, FOLATE, FERRITIN, TIBC, IRON, RETICCTPCT in the last 72 hours. Sepsis Labs: No results for input(s): PROCALCITON, LATICACIDVEN in the last 168 hours.  No results found for this or any previous visit (from the past 240 hour(s)).       Radiology Studies: No results found.      Scheduled Meds: . vitamin C  500 mg Oral Daily  . brimonidine  1 drop Both Eyes BID  . chlorhexidine  15 mL Mouth Rinse BID  . Chlorhexidine Gluconate Cloth  6 each Topical Q0600  . diltiazem  120 mg Oral Daily  . epoetin (EPOGEN/PROCRIT) injection  10,000 Units Subcutaneous Weekly  . feeding supplement (ENSURE ENLIVE)  237 mL Oral BID BM  . insulin aspart  0-15 Units Subcutaneous TID AC & HS  . mouth rinse  15 mL Mouth Rinse BID  . metoprolol tartrate  50 mg Oral BID  . pantoprazole (PROTONIX) IV  40 mg Intravenous Q24H  . sodium chloride flush  10-40 mL Intracatheter Q12H  . tamsulosin  0.4 mg Oral Daily  . zinc sulfate  220 mg Oral Daily   Continuous Infusions: . sodium chloride 0 mL (06/27/20 1630)     LOS: 20 days     Time spent: 25 minutes    Sidney Ace, MD [facility] Pager [phone]  If 7PM-7AM, please contact night-coverage 07/07/2020, 1:01 PM

## 2020-07-07 NOTE — Evaluation (Signed)
Occupational Therapy Evaluation Patient Details Name: Stephanie [name] MRN: [id number] DOB: 1941-02-16 Today's Date: 07/07/2020    History of Present Illness Stephanie [name] is a 79 y/o F w/ PMH: COVID infection, DM, RA, HTN, remote colon CA, who comes [facility] 9[facility]17. Patient found to have klebsiella sepsis and had right sided pyelonephritis. She underwent right ureteral stent placed on on 9/18. Pt with acute on chronic renal failure. Temporary fem cath placed 9/27, removed on 10/6. Pt also with SBO requiring TPN so R IJ placed 9/27 as well. Pt s/p exploratory lap and lysis of adhesions 9/30 with continue at transfer order in place.   Clinical Impression   Pt seen today for an OT re-evaluation, following a hospitalization exceeding 2+ weeks. Pt received A&O x 4, in bed, complaining of nausea but no pain. Husband present in room. Pt had temporary R femoral catheter removed yesterday (10/6). RN advices no OOB activity until 24+ hours post-removal. A full re-assessment was not possible today, given the in-bed restriction, but pt is considerably improved since original eval. Pt now able to engage in grooming from bed with set-up and CGA. Participated in in-bed therex, completing more reps and with fewer/shorter rest breaks than on previous OT session two days ago. Pt will continue to benefit from skilled OT services while hospitalized. Given her considerable debilitation and weakness, DC to a SNF is likely, to assist pt to return to PLOF.    Follow Up Recommendations  SNF;Supervision/Assistance - 24 hour    Equipment Recommendations       Recommendations for Other Services       Precautions / Restrictions Precautions Precautions: Fall Precaution Comments: Pt had temp R femoral catheter removed yesterday. Per RN, no OOB activity until 07/08/20. Restrictions Weight Bearing Restrictions: No Other Position/Activity Restrictions: No OOB activity until 07/08/20      Mobility Bed Mobility Overal bed  mobility: Needs Assistance             General bed mobility comments: not performed, as pt had R temporary fem-cath removed yesterday  Transfers                      Balance       Sitting balance - Comments: Did not assess sitting balance this session       Standing balance comment: Did not assess standing balance this session                           ADL either performed or assessed with clinical judgement   ADL Overall ADL's : Needs assistance/impaired     Grooming: Oral care;Wash/dry face;Bed level;Min guard;Set up Grooming Details (indicate cue type and reason): HOB elevated ~50 degrees                                     Vision Baseline Vision/History: No visual deficits Patient Visual Report: No change from baseline       Perception     Praxis      Pertinent Vitals/Pain Pain Assessment: No/denies pain Pain Location: states her stomach "doesn't feel good," but describes it as nausea rather than pain.     Hand Dominance Right   Extremity/Trunk Assessment Upper Extremity Assessment Upper Extremity Assessment: Generalized weakness   Lower Extremity Assessment Lower Extremity Assessment: Generalized weakness       Communication Communication  Communication: No difficulties   Cognition Arousal/Alertness: Awake/alert Behavior During Therapy: WFL for tasks assessed/performed Overall Cognitive Status: Within Functional Limits for tasks assessed                                 General Comments: Patient is HOH and requires some increased processing time   General Comments  extensive swelling, bruising, skin peeling on R forearm, wrist, and dorsum of hand    Exercises Other Exercises Other Exercises: Therex from bed level: straight arm raises x 5, with pt reporting this causes discomfort in her shoulder. 2 sets x 10 contralateral reaching, 2 set x 10 alternating forward punches. Pt and husband report  that pt's tolerance is increasing considerably. Provided educ re: importance of slowly but steadily engaging in increased levels of activity at intervals throughout the day. Pt verbalizes understanding and agreement.   Shoulder Instructions      Home Living Family/patient expects to be discharged to:: Private residence Living Arrangements: Spouse/significant other Available Help at Discharge: Family Type of Home: House Home Access: Stairs to enter [employer] of Steps: 1 partial step   Home Layout: One level     Bathroom Shower/Tub: Teacher, early years/pre: Handicapped height     Home Equipment: Environmental consultant - 4 wheels;Walker - 2 wheels;Wheelchair - manual          Prior Functioning/Environment Level of Independence: Independent with assistive device(s)        Comments: 4WW in house, primarily a [place] ambulator, no falls history in 6 months. Independent c ADL        OT Problem List: Decreased strength;Decreased coordination;Decreased range of motion;Decreased activity tolerance;Decreased safety awareness;Decreased knowledge of use of DME or AE;Impaired balance (sitting and/or standing);Cardiopulmonary status limiting activity;Decreased knowledge of precautions      OT Treatment/Interventions: Self-care/ADL training;Therapeutic exercise;Therapeutic activities;Energy conservation;Cognitive remediation/compensation;DME and/or AE instruction;Patient/family education;Balance training    OT Goals(Current goals can be found in the care plan section) Acute Rehab OT Goals Patient Stated Goal: go home OT Goal Formulation: With patient Time For Goal Achievement: 07/21/20 Potential to Achieve Goals: Good  OT Frequency: Min 1X/week   Barriers to D/C:            Co-evaluation              AM-PAC OT "6 Clicks" Daily Activity     Outcome Measure Help from another person eating meals?: A Little Help from another person taking care of personal grooming?: A  Little Help from another person toileting, which includes using toliet, bedpan, or urinal?: A Lot Help from another person bathing (including washing, rinsing, drying)?: A Lot Help from another person to put on and taking off regular upper body clothing?: A Little Help from another person to put on and taking off regular lower body clothing?: A Lot 6 Click Score: 15   End of Session Nurse Communication: Other (comment) (restrictions post removal of R temp fem cath. RN advises only in-bed activity until 24+ hours post removal.)  Activity Tolerance: Patient tolerated treatment well Patient left: in bed;with call bell/phone within reach;with bed alarm set;with family/visitor present  OT Visit Diagnosis: Unsteadiness on feet (R26.81);Muscle weakness (generalized) (M62.81)                Time: 1050-1106 OT Time Calculation (min): 16 min Charges:  OT General Charges $OT Visit: 1 Visit OT Evaluation $OT Eval Low Complexity: 1 Low OT  Treatments $Therapeutic Activity: 8-22 mins  Josiah Lobo, PhD, [place], OTR/L ascom [phone] 07/07/20, 1:02 PM

## 2020-07-08 DIAGNOSIS — N10 Acute tubulo-interstitial nephritis: Secondary | ICD-10-CM | POA: Diagnosis not present

## 2020-07-08 DIAGNOSIS — I4819 Other persistent atrial fibrillation: Secondary | ICD-10-CM | POA: Diagnosis not present

## 2020-07-08 DIAGNOSIS — I10 Essential (primary) hypertension: Secondary | ICD-10-CM | POA: Diagnosis not present

## 2020-07-08 DIAGNOSIS — R6520 Severe sepsis without septic shock: Secondary | ICD-10-CM | POA: Diagnosis not present

## 2020-07-08 DIAGNOSIS — A419 Sepsis, unspecified organism: Secondary | ICD-10-CM | POA: Diagnosis not present

## 2020-07-08 DIAGNOSIS — U071 COVID-19: Secondary | ICD-10-CM | POA: Diagnosis not present

## 2020-07-08 DIAGNOSIS — N179 Acute kidney failure, unspecified: Secondary | ICD-10-CM | POA: Diagnosis not present

## 2020-07-08 DIAGNOSIS — I4891 Unspecified atrial fibrillation: Secondary | ICD-10-CM | POA: Diagnosis not present

## 2020-07-08 LAB — CBC WITH DIFFERENTIAL/PLATELET
Abs Immature Granulocytes: 0.04 10*3/uL (ref 0.00–0.07)
Basophils Absolute: 0 10*3/uL (ref 0.0–0.1)
Basophils Relative: 0 %
Eosinophils Absolute: 0.1 10*3/uL (ref 0.0–0.5)
Eosinophils Relative: 1 %
HCT: 26.3 % — ABNORMAL LOW (ref 36.0–46.0)
Hemoglobin: 8.6 g/dL — ABNORMAL LOW (ref 12.0–15.0)
Immature Granulocytes: 0 %
Lymphocytes Relative: 8 %
Lymphs Abs: 0.7 10*3/uL (ref 0.7–4.0)
MCH: 28.7 pg (ref 26.0–34.0)
MCHC: 32.7 g/dL (ref 30.0–36.0)
MCV: 87.7 fL (ref 80.0–100.0)
Monocytes Absolute: 0.9 10*3/uL (ref 0.1–1.0)
Monocytes Relative: 9 %
Neutro Abs: 7.9 10*3/uL — ABNORMAL HIGH (ref 1.7–7.7)
Neutrophils Relative %: 82 %
Platelets: 89 10*3/uL — ABNORMAL LOW (ref 150–400)
RBC: 3 MIL/uL — ABNORMAL LOW (ref 3.87–5.11)
RDW: 17.2 % — ABNORMAL HIGH (ref 11.5–15.5)
WBC: 9.7 10*3/uL (ref 4.0–10.5)
nRBC: 0 % (ref 0.0–0.2)

## 2020-07-08 LAB — GLUCOSE, CAPILLARY
Glucose-Capillary: 155 mg/dL — ABNORMAL HIGH (ref 70–99)
Glucose-Capillary: 145 mg/dL — ABNORMAL HIGH (ref 70–99)
Glucose-Capillary: 151 mg/dL — ABNORMAL HIGH (ref 70–99)
Glucose-Capillary: 166 mg/dL — ABNORMAL HIGH (ref 70–99)

## 2020-07-08 LAB — COMPREHENSIVE METABOLIC PANEL
ALT: 53 U/L — ABNORMAL HIGH (ref 0–44)
AST: 45 U/L — ABNORMAL HIGH (ref 15–41)
Albumin: 2 g/dL — ABNORMAL LOW (ref 3.5–5.0)
Alkaline Phosphatase: 509 U/L — ABNORMAL HIGH (ref 38–126)
Anion gap: 11 (ref 5–15)
BUN: 78 mg/dL — ABNORMAL HIGH (ref 8–23)
CO2: 23 mmol/L (ref 22–32)
Calcium: 7.9 mg/dL — ABNORMAL LOW (ref 8.9–10.3)
Chloride: 105 mmol/L (ref 98–111)
Creatinine, Ser: 2.29 mg/dL — ABNORMAL HIGH (ref 0.44–1.00)
GFR calc non Af Amer: 20 mL/min — ABNORMAL LOW (ref >60)
Glucose, Bld: 171 mg/dL — ABNORMAL HIGH (ref 70–99)
Potassium: 3.9 mmol/L (ref 3.5–5.1)
Sodium: 139 mmol/L (ref 135–145)
Total Bilirubin: 0.7 mg/dL (ref 0.3–1.2)
Total Protein: 5.3 g/dL — ABNORMAL LOW (ref 6.5–8.1)

## 2020-07-08 LAB — MAGNESIUM: Magnesium: 1.5 mg/dL — ABNORMAL LOW (ref 1.7–2.4)

## 2020-07-08 LAB — PHOSPHORUS: Phosphorus: 3.5 mg/dL (ref 2.5–4.6)

## 2020-07-08 MED ORDER — HYDROCHLOROTHIAZIDE 25 MG PO TABS
25.0000 mg | ORAL_TABLET | Freq: Every day | ORAL | Status: DC
  Administered 2020-07-08 – 2020-07-10 (×3): 25 mg via ORAL
  Filled 2020-07-08 (×3): qty 1

## 2020-07-08 MED ORDER — ENSURE MAX PROTEIN PO LIQD
11.0000 [oz_av] | Freq: Every day | ORAL | Status: DC
  Administered 2020-07-09: 11 [oz_av] via ORAL
  Filled 2020-07-08: qty 330

## 2020-07-08 MED ORDER — MAGNESIUM SULFATE 2 GM/50ML IV SOLN
2.0000 g | Freq: Once | INTRAVENOUS | Status: AC
  Administered 2020-07-08: 2 g via INTRAVENOUS
  Filled 2020-07-08: qty 50

## 2020-07-08 NOTE — Hospital Course (Addendum)
Stephanie [name] is a 79 year old female with past medical history notable for type 2 diabetes mellitus, rheumatoid arthritis on chronic immunosuppressive therapy, essential hypertension, history of remote colon cancer and Covid-19 viral infection with positive PCR test June 09, 2020 who presented to the ED with with complaint of fever, progressive shortness of breath, and generalized weakness.   10/6: Postop day 6.  Recovering well overall however had some acute anemia noted this morning.  Possibly due to bleeding with removal of femoral HD catheter.  Transfused 1 unit pRBC's with good response.  Hbg stable since.  10/8-9 now tolerating diet and having bowel movements.  Stable for d/c to rehab when bed is available.

## 2020-07-08 NOTE — Progress Notes (Signed)
[facility]  ROUNDING NOTE   Subjective:   Patient more alert, oriented and pleasant today, husband at the bedside. She still has c/o intermittent nausea. 10/07 0701 - 10/08 0700 In: 1080 [P.O.:1080] Out: 2800 [Urine:2800]   Objective:  Vital signs in last 24 hours:  Temp:  [98.2 F (36.8 C)-99.1 F (37.3 C)] 98.6 F (37 C) (10/08 0749) Pulse Rate:  [75-85] 85 (10/08 0749) Resp:  [16-20] 16 (10/08 0749) BP: (157-183)/(69-81) 183/80 (10/08 0749) SpO2:  [94 %-99 %] 94 % (10/08 0749) Weight:  [94.2 kg] 94.2 kg (10/08 0500)  Weight change: 1.2 kg Filed Weights   07/06/20 0500 07/07/20 0500 07/08/20 0500  Weight: 93.7 kg 93 kg 94.2 kg    Intake/Output: I/O last 3 completed shifts: In: 3149 [P.O.:1557] Out: 4100 [Urine:4100]   Intake/Output this shift:  Total I/O In: 120 [P.O.:120] Out: 1200 [Urine:1200]  Physical Exam: General:  Sitting up in bed, appears pleasant and comfortable  Head:  Normocephalic, atraumatic  Lungs:   Normal and symmetrical effort, fine crackles at the bases +  Heart:  S1S2 no rubs or gallops  Abdomen:   Tenderness + on lower quadrants,non distended  Extremities:  2+ Peripheral edema  Neurologic:  Awake, alert,oriented x 3  Skin: Dressing intact on Rt femoral and Rt neck, No redness, drainage or active bleeding  Access: Rt fem temp dialysis catheter discontinued on 70/2/63    Basic Metabolic Panel: Recent Labs  Lab 07/02/20 0513 07/02/20 0513 07/03/20 0430 07/03/20 0430 07/04/20 7858 07/04/20 8502 07/05/20 0449 07/05/20 0449 07/06/20 0533 07/07/20 0909 07/08/20 0523  NA 135  135   < > 138   < > 132*  --  134*  --  135 136 139  K 4.3  4.3   < > 4.5   < > 4.1  --  4.0  --  3.7 3.9 3.9  CL 98  98   < > 101   < > 97*  --  100  --  102 102 105  CO2 27  27   < > 31   < > 24  --  25  --  25 24 23   GLUCOSE 294*  298*   < > 257*   < > 310*  --  167*  --  115* 213* 171*  BUN 86*  85*   < > 91*   < > 94*  --  99*  --  98*  88* 78*  CREATININE 4.79*  4.72*   < > 4.38*   < > 3.79*  --  3.35*  --  2.90* 2.74* 2.29*  CALCIUM 8.5*  8.4*   < > 8.3*   < > 8.3*   < > 8.2*   < > 8.0* 7.8* 7.9*  MG 2.0   < > 1.9  --  1.7  --  2.1  --  2.0  --  1.5*  PHOS 3.3  --   --   --  2.1*  --  2.0*  --  2.6  --  3.5   < > = values in this interval not displayed.    Liver Function Tests: Recent Labs  Lab 07/02/20 0513 07/04/20 0607 07/08/20 0523  AST  --  12* 45*  ALT  --  9 53*  ALKPHOS  --  111 509*  BILITOT  --  0.5 0.7  PROT  --  4.8* 5.3*  ALBUMIN 2.0* 1.9* 2.0*   No results for input(s): LIPASE, AMYLASE  in the last 168 hours. No results for input(s): AMMONIA in the last 168 hours.  CBC: Recent Labs  Lab 07/04/20 0607 07/04/20 0607 07/05/20 0449 07/06/20 0533 07/06/20 1940 07/07/20 0909 07/08/20 0523  WBC 8.1  --  8.4 7.9  --  9.4 9.7  NEUTROABS 6.7  --   --   --   --  7.7 7.9*  HGB 8.5*   < > 8.1* 6.7* 8.0* 8.7* 8.6*  HCT 25.4*   < > 23.8* 21.1* 24.4* 27.0* 26.3*  MCV 85.2  --  85.3 88.7  --  87.4 87.7  PLT 85*  --  83* 75*  --  84* 89*   < > = values in this interval not displayed.    Cardiac Enzymes: No results for input(s): CKTOTAL, CKMB, CKMBINDEX, TROPONINI in the last 168 hours.  BNP: Invalid input(s): POCBNP  CBG: Recent Labs  Lab 07/07/20 0741 07/07/20 1137 07/07/20 1640 07/07/20 2156 07/08/20 0746  GLUCAP 191* 224* 172* 150* 155*    Microbiology: Results for orders placed or performed during the hospital encounter of 06/17/20  Blood culture (routine x 2)     Status: Abnormal   Collection Time: 06/17/20  3:57 AM   Specimen: BLOOD  Result Value Ref Range Status   Specimen Description   Final    BLOOD LEFT HAND Performed at [facility], [address], [place], [place] [address]    Special Requests   Final    BOTTLES DRAWN AEROBIC AND ANAEROBIC Blood Culture adequate volume Performed at [facility], [address] [place], [place] [address]     Culture  Setup Time   Final    Organism ID to follow GRAM NEGATIVE RODS IN BOTH AEROBIC AND ANAEROBIC BOTTLES CRITICAL RESULT CALLED TO, READ BACK BY AND VERIFIED WITH: AMY THOMPSON AT 1534 ON 06/17/20 [place] Performed at [facility], [address] [place], [place] [address]    Culture KLEBSIELLA PNEUMONIAE (A)  Final   Report Status 06/19/2020 FINAL  Final   Organism ID, Bacteria KLEBSIELLA PNEUMONIAE  Final      Susceptibility   Klebsiella pneumoniae - MIC*    AMPICILLIN RESISTANT Resistant     CEFAZOLIN <=4 SENSITIVE Sensitive     CEFEPIME <=0.12 SENSITIVE Sensitive     CEFTAZIDIME <=1 SENSITIVE Sensitive     CEFTRIAXONE <=0.25 SENSITIVE Sensitive     CIPROFLOXACIN <=0.25 SENSITIVE Sensitive     GENTAMICIN <=1 SENSITIVE Sensitive     IMIPENEM <=0.25 SENSITIVE Sensitive     TRIMETH/SULFA <=20 SENSITIVE Sensitive     AMPICILLIN/SULBACTAM 4 SENSITIVE Sensitive     PIP/TAZO <=4 SENSITIVE Sensitive     * KLEBSIELLA PNEUMONIAE  Blood culture (routine x 2)     Status: Abnormal   Collection Time: 06/17/20  3:57 AM   Specimen: BLOOD  Result Value Ref Range Status   Specimen Description   Final    BLOOD RIGHT [facility] Performed at [facility], [address] [place], [place] [address]    Special Requests   Final    BOTTLES DRAWN AEROBIC AND ANAEROBIC Blood Culture adequate volume Performed at [facility], [address] [place], [place] [address]    Culture  Setup Time   Final    GRAM NEGATIVE RODS IN BOTH AEROBIC AND ANAEROBIC BOTTLES CRITICAL VALUE NOTED.  VALUE IS CONSISTENT WITH PREVIOUSLY REPORTED AND CALLED VALUE. Performed at [facility], [address], [place], [place] [address]    Culture (  A)  Final    KLEBSIELLA PNEUMONIAE SUSCEPTIBILITIES PERFORMED ON PREVIOUS CULTURE WITHIN THE LAST 5 DAYS. Performed at [facility], [place] [address], [place], [place] [address]    Report Status 06/19/2020 FINAL  Final  Blood Culture ID  Panel (Reflexed)     Status: Abnormal   Collection Time: 06/17/20  3:57 AM  Result Value Ref Range Status   Enterococcus faecalis NOT DETECTED NOT DETECTED Final   Enterococcus Faecium NOT DETECTED NOT DETECTED Final   Listeria monocytogenes NOT DETECTED NOT DETECTED Final   Staphylococcus species NOT DETECTED NOT DETECTED Final   Staphylococcus aureus (BCID) NOT DETECTED NOT DETECTED Final   Staphylococcus epidermidis NOT DETECTED NOT DETECTED Final   Staphylococcus lugdunensis NOT DETECTED NOT DETECTED Final   Streptococcus species NOT DETECTED NOT DETECTED Final   Streptococcus agalactiae NOT DETECTED NOT DETECTED Final   Streptococcus pneumoniae NOT DETECTED NOT DETECTED Final   Streptococcus pyogenes NOT DETECTED NOT DETECTED Final   A.calcoaceticus-baumannii NOT DETECTED NOT DETECTED Final   Bacteroides fragilis NOT DETECTED NOT DETECTED Final   Enterobacterales DETECTED (A) NOT DETECTED Final    Comment: Enterobacterales represent a large order of gram negative bacteria, not a single organism. CRITICAL RESULT CALLED TO, READ BACK BY AND VERIFIED WITH: AMY THOMPSON AT 5188 ON 06/17/20 SNG    Enterobacter cloacae complex NOT DETECTED NOT DETECTED Final   Escherichia coli NOT DETECTED NOT DETECTED Final   Klebsiella aerogenes NOT DETECTED NOT DETECTED Final   Klebsiella oxytoca NOT DETECTED NOT DETECTED Final   Klebsiella pneumoniae DETECTED (A) NOT DETECTED Final    Comment: CRITICAL RESULT CALLED TO, READ BACK BY AND VERIFIED WITH: AMY THOMPSON RN AT 4166 ON 06/17/20 SNG    Proteus species NOT DETECTED NOT DETECTED Final   Salmonella species NOT DETECTED NOT DETECTED Final   Serratia marcescens NOT DETECTED NOT DETECTED Final   Haemophilus influenzae NOT DETECTED NOT DETECTED Final   Neisseria meningitidis NOT DETECTED NOT DETECTED Final   Pseudomonas aeruginosa NOT DETECTED NOT DETECTED Final   Stenotrophomonas maltophilia NOT DETECTED NOT DETECTED Final   Candida albicans  NOT DETECTED NOT DETECTED Final   Candida auris NOT DETECTED NOT DETECTED Final   Candida glabrata NOT DETECTED NOT DETECTED Final   Candida krusei NOT DETECTED NOT DETECTED Final   Candida parapsilosis NOT DETECTED NOT DETECTED Final   Candida tropicalis NOT DETECTED NOT DETECTED Final   Cryptococcus neoformans/gattii NOT DETECTED NOT DETECTED Final   CTX-M ESBL NOT DETECTED NOT DETECTED Final   Carbapenem resistance IMP NOT DETECTED NOT DETECTED Final   Carbapenem resistance KPC NOT DETECTED NOT DETECTED Final   Carbapenem resistance NDM NOT DETECTED NOT DETECTED Final   Carbapenem resist OXA 48 LIKE NOT DETECTED NOT DETECTED Final   Carbapenem resistance VIM NOT DETECTED NOT DETECTED Final    Comment: Performed at [facility], [address] [place], [place] [address]  Urine Culture     Status: Abnormal   Collection Time: 06/17/20 11:01 AM   Specimen: Urine, Random  Result Value Ref Range Status   Specimen Description   Final    URINE, RANDOM Performed at [facility], [address] [place], [place] [address]    Special Requests   Final    NONE Performed at [facility], [address] [place], [place] [address]    Culture >=100,000 COLONIES/mL KLEBSIELLA PNEUMONIAE (A)  Final   Report Status 06/19/2020 FINAL  Final   Organism ID, Bacteria KLEBSIELLA  PNEUMONIAE (A)  Final      Susceptibility   Klebsiella pneumoniae - MIC*    AMPICILLIN RESISTANT Resistant     CEFAZOLIN <=4 SENSITIVE Sensitive     CEFTRIAXONE <=0.25 SENSITIVE Sensitive     CIPROFLOXACIN <=0.25 SENSITIVE Sensitive     GENTAMICIN <=1 SENSITIVE Sensitive     IMIPENEM <=0.25 SENSITIVE Sensitive     NITROFURANTOIN 32 SENSITIVE Sensitive     TRIMETH/SULFA <=20 SENSITIVE Sensitive     AMPICILLIN/SULBACTAM 4 SENSITIVE Sensitive     PIP/TAZO <=4 SENSITIVE Sensitive     * >=100,000 COLONIES/mL KLEBSIELLA PNEUMONIAE  Culture, blood (Routine X 2) w Reflex to ID Panel     Status: None    Collection Time: 06/21/20 11:07 AM   Specimen: BLOOD  Result Value Ref Range Status   Specimen Description BLOOD RIGHT [facility]  Final   Special Requests   Final    BOTTLES DRAWN AEROBIC AND ANAEROBIC Blood Culture adequate volume   Culture   Final    NO GROWTH 5 DAYS Performed at [facility], [address] [place], [place] [address]    Report Status 06/26/2020 FINAL  Final  Culture, blood (Routine X 2) w Reflex to ID Panel     Status: None   Collection Time: 06/21/20 11:09 AM   Specimen: BLOOD  Result Value Ref Range Status   Specimen Description BLOOD LEFTHAND  Final   Special Requests   Final    BOTTLES DRAWN AEROBIC AND ANAEROBIC Blood Culture adequate volume   Culture   Final    NO GROWTH 5 DAYS Performed at [facility], [address] [place], [place] [address]    Report Status 06/26/2020 FINAL  Final    Coagulation Studies: No results for input(s): LABPROT, INR in the last 72 hours.  Urinalysis: No results for input(s): COLORURINE, LABSPEC, PHURINE, GLUCOSEU, HGBUR, BILIRUBINUR, KETONESUR, PROTEINUR, UROBILINOGEN, NITRITE, LEUKOCYTESUR in the last 72 hours.  Invalid input(s): APPERANCEUR    Imaging: No results found.   Medications:   . sodium chloride 250 mL (07/08/20 0832)   . vitamin C  500 mg Oral Daily  . brimonidine  1 drop Both Eyes BID  . chlorhexidine  15 mL Mouth Rinse BID  . Chlorhexidine Gluconate Cloth  6 each Topical Q0600  . diltiazem  120 mg Oral Daily  . epoetin (EPOGEN/PROCRIT) injection  10,000 Units Subcutaneous Weekly  . feeding supplement (ENSURE ENLIVE)  237 mL Oral BID BM  . hydrochlorothiazide  25 mg Oral Daily  . insulin aspart  0-15 Units Subcutaneous TID AC & HS  . mouth rinse  15 mL Mouth Rinse BID  . metoprolol tartrate  50 mg Oral BID  . pantoprazole (PROTONIX) IV  40 mg Intravenous Q24H  . sodium chloride flush  10-40 mL Intracatheter Q12H  . tamsulosin  0.4 mg Oral Daily  . zinc sulfate  220 mg Oral  Daily   sodium chloride, acetaminophen, albuterol, guaiFENesin-dextromethorphan, heparin, labetalol, morphine injection, ondansetron (ZOFRAN) IV  Assessment/ Plan:  Ms. Stephanie [name] is a 79 y.o.  female admitted to [facility] for COVID pneumonia. Patient found to have klebsiella sepsis and had right sided pyelonephritis. She underwent right ureteral stent placed on on 9/18.  Patient had IV contrast exposure on 9/17.   # AKI secondary to ATN Patient has AKI, likely secondary to sepsis/obstructive uropathy/did have IV contrast exposure/hypotension. BASELINE Cr 0.90 in January 2021 Received dialysis 1st treatment 06/28/2020 Urine output for the preceding 24 hours  is 2800 ml. Creatinine down to 2.29.     Lab Results  Component Value Date   CREATININE 2.29 (H) 07/08/2020   CREATININE 2.74 (H) 07/07/2020   CREATININE 2.90 (H) 07/06/2020     # Anemia of renal failure Lab Results  Component Value Date   HGB 8.6 (L) 07/08/2020  Patient received blood transfusion during this admission Hgb today is 8.6 Continue Epogen 10,000 subcutaneous weekly.  # hyponatremia Na+ normalized, 136 today Will continue monitoring    # obstructive uropathy with pyelonephritis Patient is status post right ureteral stent placement by Dr. Jeffie Pollock on September 18 F/C with excellent urine output, dark yellow, no blood clots noted   #Small bowel obstruction Status post ex lap on September 30 for takedown of adhesions.  Small bowel was completely stuck and fused to the anterior abdominal wall.  Extensive lysis of adhesions was conducted.   Patient on soft diet now, has c/o intermittent nausea. No abdominal distension noted Getting followed by [place] team       LOS: 21 Melinda Gwinner 10/8/202111:21 AM

## 2020-07-08 NOTE — Progress Notes (Signed)
Progress Note  Patient Name: Stephanie [name] Date of Encounter: 07/08/2020  [facility] Cardiologist: [facility]  Subjective   Reports feeling relatively well Appetite picking up Husband at the bedside, reports that she is weak but overall much improved Maintaining normal sinus rhythm Blood pressure running high Catheter removed from right IJ  Nurses reported significant bleeding from femoral hemodialysis catheter at time of removal, given 1 unit packed red blood cells  Inpatient Medications    Scheduled Meds: . vitamin C  500 mg Oral Daily  . brimonidine  1 drop Both Eyes BID  . chlorhexidine  15 mL Mouth Rinse BID  . Chlorhexidine Gluconate Cloth  6 each Topical Q0600  . diltiazem  120 mg Oral Daily  . epoetin (EPOGEN/PROCRIT) injection  10,000 Units Subcutaneous Weekly  . feeding supplement (ENSURE ENLIVE)  237 mL Oral BID BM  . hydrochlorothiazide  25 mg Oral Daily  . insulin aspart  0-15 Units Subcutaneous TID AC & HS  . mouth rinse  15 mL Mouth Rinse BID  . metoprolol tartrate  50 mg Oral BID  . pantoprazole (PROTONIX) IV  40 mg Intravenous Q24H  . sodium chloride flush  10-40 mL Intracatheter Q12H  . tamsulosin  0.4 mg Oral Daily  . zinc sulfate  220 mg Oral Daily   Continuous Infusions: . sodium chloride 250 mL (07/08/20 0832)   PRN Meds: sodium chloride, acetaminophen, albuterol, guaiFENesin-dextromethorphan, heparin, labetalol, morphine injection, ondansetron (ZOFRAN) IV   Vital Signs    Vitals:   07/08/20 0500 07/08/20 0518 07/08/20 0749 07/08/20 1142  BP:  (!) 172/76 (!) 183/80 (!) 151/68  Pulse:  81 85 78  Resp:  20 16 20   Temp:  99 F (37.2 C) 98.6 F (37 C) 98.7 F (37.1 C)  TempSrc:  Oral Oral Oral  SpO2:  96% 94% 95%  Weight: 94.2 kg     Height:        Intake/Output Summary (Last 24 hours) at 07/08/2020 1228 Last data filed at 07/08/2020 0900 Gross per 24 hour  Intake 600 ml  Output 4000 ml  Net -3400 ml   Last 3 Weights 07/08/2020  07/07/2020 07/06/2020  Weight (lbs) 207 lb 10.8 oz 205 lb 0.4 oz 206 lb 9.1 oz  Weight (kg) 94.2 kg 93 kg 93.7 kg      Telemetry    NSR - Personally Reviewed  ECG     - Personally Reviewed  Physical Exam   Constitutional:  oriented to person, place, and time. No distress.  HENT:  Head: Grossly normal Eyes:  no discharge. No scleral icterus.  Neck: No JVD, no carotid bruits  Cardiovascular: Regular rate and rhythm, no murmurs appreciated Pulmonary/Chest: Clear to auscultation bilaterally, no wheezes or rails Abdominal: Soft.  no distension.  no tenderness.  Musculoskeletal: Normal range of motion Neurological:  normal muscle tone. Coordination normal. No atrophy Skin: Skin warm and dry Psychiatric: normal affect, pleasant  Labs    High Sensitivity Troponin:   Recent Labs  Lab 06/17/20 0357 06/17/20 1101  TROPONINIHS 61* 71*      Chemistry Recent Labs  Lab 07/02/20 0513 07/02/20 0513 07/03/20 0430 07/03/20 0430 07/04/20 0607 07/04/20 0607 07/05/20 0449 07/05/20 0449 07/06/20 0533 07/07/20 0909 07/08/20 0523  NA 135  135   < > 138   < > 132*   < > 134*   < > 135 136 139  K 4.3  4.3   < > 4.5   < > 4.1   < >  4.0   < > 3.7 3.9 3.9  CL 98  98   < > 101   < > 97*   < > 100   < > 102 102 105  CO2 27  27   < > 31   < > 24   < > 25   < > 25 24 23   GLUCOSE 294*  298*   < > 257*   < > 310*   < > 167*   < > 115* 213* 171*  BUN 86*  85*   < > 91*   < > 94*   < > 99*   < > 98* 88* 78*  CREATININE 4.79*  4.72*   < > 4.38*   < > 3.79*   < > 3.35*   < > 2.90* 2.74* 2.29*  CALCIUM 8.5*  8.4*   < > 8.3*   < > 8.3*   < > 8.2*   < > 8.0* 7.8* 7.9*  PROT  --   --   --   --  4.8*  --   --   --   --   --  5.3*  ALBUMIN 2.0*  --   --   --  1.9*  --   --   --   --   --  2.0*  AST  --   --   --   --  12*  --   --   --   --   --  45*  ALT  --   --   --   --  9  --   --   --   --   --  53*  ALKPHOS  --   --   --   --  111  --   --   --   --   --  509*  BILITOT  --   --   --    --  0.5  --   --   --   --   --  0.7  GFRNONAA 8*  8*   < > 9*   < > 11*   < > 13*   < > 15* 16* 20*  GFRAA 9*  10*   < > 11*  --  13*  --  15*  --   --   --   --   ANIONGAP 10  10   < > 6   < > 11   < > 9   < > 8 10 11    < > = values in this interval not displayed.     Hematology Recent Labs  Lab 07/06/20 0533 07/06/20 0533 07/06/20 1940 07/07/20 0909 07/08/20 0523  WBC 7.9  --   --  9.4 9.7  RBC 2.38*  --   --  3.09* 3.00*  HGB 6.7*   < > 8.0* 8.7* 8.6*  HCT 21.1*   < > 24.4* 27.0* 26.3*  MCV 88.7  --   --  87.4 87.7  MCH 28.2  --   --  28.2 28.7  MCHC 31.8  --   --  32.2 32.7  RDW 17.1*  --   --  17.3* 17.2*  PLT 75*  --   --  84* 89*   < > = values in this interval not displayed.    BNP No results for input(s): BNP, PROBNP in the last 168 hours.   DDimer No results for input(s): DDIMER  in the last 168 hours.   Radiology    No results found.  Cardiac Studies     Patient Profile     79 y.o. female with history of hypertension, diabetes, hyperlipidemia, rheumatoid arthritis, chronic urinary retention requiring self-catheterization presenting with fevers chills, diagnosed with sepsis likely from urinary origin, underwent right renal stent placement due to obstruction.  Patient being seen for new onset atrial fibrillation with rapid ventricular response.  Assessment & Plan    Obstructive uropathy with pyelonephritis, Klebsiella Completed long course of IV antibiotics Zosyn followed by ceftriaxone followed by Cipro Followed by urology  Acute renal failure/ATN In the setting of sepsis, obstructive uropathy, IV contrast nephropathy, Slow improvement  Ileus/small bowel obstruction Tolerating diet, NG tube discontinued last week, TPN discontinued yesterday  Persistent atrial fibrillation Maintaining normal sinus rhythm We will increase Cardizem extended release up to 120 twice daily given hypertension Continue metoprolol ----Hesitancy to restart a NOAC in  the setting of anemia, just received transfusion.  Would prefer further monitoring for now, outpatient follow-up, hemoglobin of 10 before starting.  Also likely high fall risk.  Will need to continue reassessment and consideration of restarting in the near future  Anemia Etiology is multifactorial Hemoglobin 8.6, recent transfusion after blood loss through hemodialysis catheter  Sepsis Klebsiella urinary tract infection with urinary obstruction On broad-spectrum antibiotics ATN Recovering  COVID-19 pneumonia Patient'sCOVID-19 PCR test was positive on 06/09/20.   Remdesivir started on admission Appears to have recovered  Hematuria Renal ultrasound with no hydronephrosis Being treated for Klebsiella infection    Total encounter time more than 25 minutes  Greater than 50% was spent in counseling and coordination of care with the patient    For questions or updates, please contact [place] Please consult [email or web address] for contact info under        Signed, Ida Rogue, MD  07/08/2020, 12:28 PM

## 2020-07-08 NOTE — Progress Notes (Signed)
Occupational Therapy Treatment Patient Details Name: Stephanie [name] MRN: [id number] DOB: 03-Nov-1940 Today's Date: 07/08/2020    History of present illness Stephanie [name] is a 79 y/o F w/ PMH: COVID infection, DM, RA, HTN, remote colon CA, who comes to [facility] on 9/17. Patient found to have klebsiella sepsis and had right sided pyelonephritis. She underwent right ureteral stent placed on on 9/18. Pt with acute on chronic renal failure. Temporary fem cath placed 9/27, removed on 10/6. Pt also with SBO requiring TPN so R IJ placed 9/27 as well. Pt s/p exploratory lap and lysis of adhesions 9/30 with continue at transfer order in place. Her femoral catheter removed 07/06/20.   OT comments  Pt seen for OT tx this date. Pt pleasant and alert, but politely declines initial encouragement for bed mobility and exercises with therapist. Pt agreeable to brushing her teeth. Set up and supervision provided to wash face, brush teeth, and apply lotion to hands. Significant edema to RUE>LUE. Additional lotion applied and retrograde massage performed to RUE as well as BUE repositioning with pillows to support edema mgt. Pt educated in positioning and modified self retrograde massage. Pt verbalized understanding. Pt endorses feeling a "lot better" today than previous date despite not feeling too well. Pt continues to benefit from skilled OT services. Continue to recommend SNF at this time.    Follow Up Recommendations  SNF    Equipment Recommendations  3 in 1 bedside commode (bariatric BSC)    Recommendations for Other Services      Precautions / Restrictions Precautions Precautions: Fall Precaution Comments: temp R femoral catheter removed 10/6; R IJ removed 10/8 Restrictions Weight Bearing Restrictions: No       Mobility Bed Mobility               General bed mobility comments: pt declined bed mobility and exercises 2/2 fatigue and "not feeling good today"  Transfers                       Balance                                           ADL either performed or assessed with clinical judgement   ADL Overall ADL's : Needs assistance/impaired     Grooming: Oral care;Wash/dry face;Bed level;Set up;Supervision/safety Grooming Details (indicate cue type and reason): set up and supervision to complete brushing teeth and washing face as well as applying lotion to hands                                     Vision       Perception     Praxis      Cognition Arousal/Alertness: Awake/alert Behavior During Therapy: WFL for tasks assessed/performed Overall Cognitive Status: Within Functional Limits for tasks assessed                                          Exercises Other Exercises Other Exercises: Retrograde massage and repositioning provided to RUE to support edema mgt with visually notable decrease in edema in R hand and pt very pleased; pillows replaced to improve positioning for edema mgt, RN notified   Shoulder  Instructions       General Comments      Pertinent Vitals/ Pain       Pain Assessment: Faces Faces Pain Scale: Hurts little more Pain Location: abdominal discomfort, again states "stuff is going in but nothing is coming out" Pain Descriptors / Indicators: Sore;Discomfort Pain Intervention(s): Limited activity within patient's tolerance;Monitored during session;Repositioned  Home Living                                          Prior Functioning/Environment              Frequency  Min 1X/week        Progress Toward Goals  OT Goals(current goals can now be found in the care plan section)  Progress towards OT goals: Progressing toward goals  Acute Rehab OT Goals Patient Stated Goal: go home OT Goal Formulation: With patient Time For Goal Achievement: 07/21/20 Potential to Achieve Goals: Good  Plan Discharge plan remains appropriate;Frequency remains appropriate     Co-evaluation                 AM-PAC OT "6 Clicks" Daily Activity     Outcome Measure   Help from another person eating meals?: None Help from another person taking care of personal grooming?: None Help from another person toileting, which includes using toliet, bedpan, or urinal?: A Lot Help from another person bathing (including washing, rinsing, drying)?: A Lot Help from another person to put on and taking off regular upper body clothing?: A Little Help from another person to put on and taking off regular lower body clothing?: A Lot 6 Click Score: 17    End of Session    OT Visit Diagnosis: Unsteadiness on feet (R26.81);Muscle weakness (generalized) (M62.81)   Activity Tolerance Patient tolerated treatment well   Patient Left in bed;with call bell/phone within reach;with bed alarm set   Nurse Communication          Time: 3491-7915 OT Time Calculation (min): 23 min  Charges: OT General Charges $OT Visit: 1 Visit OT Treatments $Self Care/Home Management : 8-22 mins $Therapeutic Activity: 8-22 mins  Jeni Salles, MPH, MS, OTR/L ascom [phone] 07/08/20, 2:34 PM

## 2020-07-08 NOTE — Progress Notes (Signed)
PT Cancellation Note  Patient Details Name: Stephanie [name] MRN: [id number] DOB: 14-Dec-1940   Cancelled Treatment:     PT attempt. Pt currently on bedpan attempting to have BM. Requested therapist return at later time/ date.   Willette Pa 07/08/2020, 3:52 PM

## 2020-07-08 NOTE — Progress Notes (Addendum)
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   10/6: Postop day 6.  Recovering well overall however had some acute anemia noted this morning.  Unclear source of blood loss, possibly due to bleeding with removal of femoral HD catheter.  Transfused 1 unit pRBC's with good response     Assessment & Plan   Principal Problem:   Sepsis ([place]) Active Problems:   Benign essential hypertension   Chronic cystitis   Rheumatoid arthritis involving multiple sites with positive rheumatoid factor (HCC)   Diabetes mellitus (HCC)   Rheumatoid arthritis ([place])   COVID-19 virus infection   AKI (acute kidney injury) ([place])   Atrial fibrillation with rapid ventricular response (HCC)   Essential hypertension   Pure hypercholesterolemia   Acute pyelonephritis   Hydronephrosis, right   Lactic acid acidosis   Hyperglycemia   Hx of essential hypertension   Immunosuppression due to drug therapy ([place])   Pressure injury of skin   Acquired thrombophilia ([place])   On continuous oral anticoagulation   SBO (small bowel obstruction) (HCC)   Goals of care, counseling/discussion   Palliative care by specialist   CKD (chronic kidney disease) stage 5, GFR less than 15 ml/min (HCC)   Ileus (HCC)   Paroxysmal A-fib (HCC)   Sepsis, present on admission Klebsiella pneumonia septicemia, POA Right pyelonephritis Patient presenting with fever, T-max 103.0 with associated tachycardia, tachypnea with a WBC count of 20 K with a left  shift and lactic acid of 3.1. Underwent right ureter stent placement by urology with frank pus noted to kidney. And blood cultures x2 positive for Klebsiella pneumonia. WBC count improved from 20K to 7.5. Completed 10-day course of IV antibiotics with Zosyn followed by ceftriaxone followed by ciprofloxacin. Blood cultures 06/21/2020 negative. --Continue monitor fever curve, supportive care  Severe right hydronephrosis CT renal notable for progressive right UPJ obstruction in comparison to prior study dated October 2020. Urology was consulted and right stent placed with findings of frank pus in the kidney by Dr. Jeffie Pollock on 06/18/2020. Follow-up renal ultrasound 06/20/2020 with resolved hydronephrosis. --Continue Foley catheter per urology, Dr. Jeffie Pollock --Continue strict I's and O's  Acute renal failure Patient presenting above with severe right hydronephrosis with associated sepsis from Klebsiella pneumonia septicemia/pyelonephritis. Baseline creatinine 0.90 January 2021. Creatinine 1.57 on admission, trended up to a high of 6.33 during admission. Etiology likely secondary to ATN from severe infection as above. --Nephrology following, appreciate assistance --Underwent placement of temporary HD catheter to right common femoral vein by vascular surgery on 06/27/2020.  --Started on dialysis 9/28; right femoral PermCath removed 07/06/2020.  No further plans for inpatient hemodialysis --Cr 6.33>>4.38>3.79>3.35>2.90>2.74>2.29 --Avoid nephrotoxins, renally dose all medications --Closely monitor urinary output --Follow BMP daily  Acute postoperative blood loss anemia Hemoglobin dropped from 8.2 to 7.1 postoperatively on 07/01/2020. Transfused 1 unit PRBC on 07/01/2020.   Transfuse additional unit PRBC on 07/06/2020 . iron panel with iron 34, TIBC 195, ferritin 605, consistent with anemia of chronic disease. --Hgb 8.2>7.1>9.1>8.8>8.5>8.1>6.7>8.0>8.7>8.6 --Goal maintain hemoglobin greater than 8.0  given  cardiac history Plan: Daily H&H   Thrombocytopenia Suspect due to sepsis.  Did have bleeding with removal of femoral HD cath.   --Avoid heparin / enoxaparin --SCD's for VTE ppx --monitor CBC  Hypokalemia Hypomagnesemia Replete as necessary --Goal maintainK 4.0 and Mg 2.0 given card hx and afib --Follow electrolytes daily --TPN discontinued  Small bowel obstruction CT abdomen/pelvis with oral contrast notable for high-grade small bowel obstruction with abrupt transition point central lower abdomen, likely secondary to adhesions. Underwent exploratory laparotomy on 06/30/2020 by Dr. Dahlia Byes with extensive lysis of adhesions. --NG tube discontinued 10/3 --TPN discontinued on 07/07/2020 --Diet advanced to soft diet by general surgery on 10/6 - did not tolerate breakfast today 10/8.   --Continue to monitor bowel movements closely --Further per general surgery --Once patient is able to tolerate at least 50% of her meals we can plan on discharge to SNF  Paroxysmal atrial fibrillation with RVR --Cardiology following, appreciate assistance --CHA2DS2-VASc score = 5 (HTN, age, T2DM, gender) --Metoprololtartrate50 mg p.o. twice daily --Cardizem CD 120 mg daily --Discontinued heparin drip 2/2 bleeding surrounding HD catheterand thrombocytopenia --continue to monitor on telemetry --Follow up cardiology recs for A. fib, anticoagulation in setting of bleeding  GERD:Protonix 40 mg IV every 24 hours  Pressure injury of buttock, stage I, POA --Continue local wound care, offloading --Reposition patient every 2 hrs  Hx COVID-19 viral infection Patient fully vaccinated, received Pfizer vaccine on 10/16/2019 and 11/07/2019. Covid-19 PCR positive on 06/09/2020. No overt respiratory symptoms. CT chest without acute pulmonary abnormality. Oxygenating well on room air. Completed 5-day course of IV remdesivir.  Discontinued airborne/contact isolation since greater than 21 days since initial  diagnosis.  History of rheumatoid arthritis Hold leflunomide and topical tacrolimus on hold given acute issues. Will resume these tomorrow.  Hx of Hypertension on metoprolol, diltiazem.  Resume home HCTZ today as BP's uncontrolled.  Obesity: Body mass index is 33.52 kg/m.  Complicates overall care and prognosis  DVT prophylaxis: SCD's   Diet:  Diet Orders (From admission, onward)    Start     Ordered   07/06/20 0817  DIET SOFT Room service appropriate? Yes; Fluid consistency: Thin  Diet effective now       Question Answer Comment  Room service appropriate? Yes   Fluid consistency: Thin      07/06/20 0816            Code Status: DNR    Subjective 07/08/20    Patient seen this AM with emesis bag in her lap.  She reported trying eggs for breakfast, shortly after became nauseated and vomiting, also thinks magnesium contributed to nausea.  Denies abdominal pain, fevers or chills.     Disposition Plan & Communication   Status is: Inpatient  Remains inpatient appropriate because:IV treatments appropriate due to intensity of illness or inability to take PO.  Patient's diet is being advanced, currently with inadequate PO intake for safe discharge.  Anticipate ready for d/c to SNF 1-2 days if PO tolerance improves.   Dispo:  Patient From: Home  Planned Disposition: [place]  Expected discharge date: 07/10/20  Medically stable for discharge: No    Family Communication: None at bedside, will attempt to call    Consults, Procedures, Significant Events   Consultants:   General Surgery  Nephrology  Cardiology  Procedures:   Ex-lap, lysis of adhesions  Antimicrobials:  Anti-infectives (From admission, onward)   Start     Dose/Rate Route Frequency Ordered Stop   06/30/20  0600  cefoTEtan (CEFOTAN) 2 g in sodium chloride 0.9 % 100 mL IVPB        2 g 200 mL/hr over 30 Minutes Intravenous On call to O.R. 06/29/20 1302 06/30/20 1322   06/26/20 1000   cefTRIAXone (ROCEPHIN) 2 g in sodium chloride 0.9 % 100 mL IVPB        2 g 200 mL/hr over 30 Minutes Intravenous Every 24 hours 06/25/20 1138 06/26/20 1112   06/25/20 1000  ciprofloxacin (CIPRO) IVPB 400 mg  Status:  Discontinued        400 mg 200 mL/hr over 60 Minutes Intravenous Every 24 hours 06/24/20 1234 06/26/20 0919   06/23/20 1000  ciprofloxacin (CIPRO) tablet 500 mg  Status:  Discontinued        500 mg Oral Daily 06/23/20 0927 06/24/20 1234   06/20/20 1600  piperacillin-tazobactam (ZOSYN) IVPB 3.375 g  Status:  Discontinued        3.375 g 12.5 mL/hr over 240 Minutes Intravenous Every 12 hours 06/20/20 1217 06/23/20 0926   06/19/20 2000  piperacillin-tazobactam (ZOSYN) IVPB 3.375 g  Status:  Discontinued        3.375 g 12.5 mL/hr over 240 Minutes Intravenous Every 8 hours 06/19/20 1915 06/20/20 1217   06/18/20 1800  cefTRIAXone (ROCEPHIN) 2 g in sodium chloride 0.9 % 100 mL IVPB  Status:  Discontinued        2 g 200 mL/hr over 30 Minutes Intravenous Every 24 hours 06/17/20 1545 06/17/20 1545   06/18/20 1000  remdesivir 100 mg in sodium chloride 0.9 % 100 mL IVPB       "Followed by" Linked Group Details   100 mg 200 mL/hr over 30 Minutes Intravenous Daily 06/17/20 0809 06/21/20 0954   06/18/20 0800  azithromycin (ZITHROMAX) 500 mg in sodium chloride 0.9 % 250 mL IVPB  Status:  Discontinued        500 mg 250 mL/hr over 60 Minutes Intravenous Every 24 hours 06/17/20 0805 06/17/20 0914   06/17/20 1800  cefTRIAXone (ROCEPHIN) 2 g in sodium chloride 0.9 % 100 mL IVPB  Status:  Discontinued        2 g 200 mL/hr over 30 Minutes Intravenous Every 24 hours 06/17/20 1545 06/19/20 1915   06/17/20 1500  cefTRIAXone (ROCEPHIN) 2 g in sodium chloride 0.9 % 100 mL IVPB  Status:  Discontinued        2 g 200 mL/hr over 30 Minutes Intravenous Every 24 hours 06/17/20 0805 06/17/20 1545   06/17/20 1000  remdesivir 200 mg in sodium chloride 0.9% 250 mL IVPB       "Followed by" Linked Group Details     200 mg 580 mL/hr over 30 Minutes Intravenous Once 06/17/20 0809 06/17/20 1216   06/17/20 0530  ceFEPIme (MAXIPIME) 1 g in sodium chloride 0.9 % 100 mL IVPB        1 g 200 mL/hr over 30 Minutes Intravenous  Once 06/17/20 0515 06/17/20 0810   06/17/20 0530  vancomycin (VANCOCIN) IVPB 1000 mg/200 mL premix        1,000 mg 200 mL/hr over 60 Minutes Intravenous  Once 06/17/20 0515 06/17/20 0929   06/17/20 0530  azithromycin (ZITHROMAX) tablet 500 mg        500 mg Oral  Once 06/17/20 0515 06/17/20 0723         Objective   Vitals:   07/08/20 0500 07/08/20 0518 07/08/20 0749 07/08/20 1142  BP:  (!) 172/76 (!) 183/80 (!) 151/68  Pulse:  81 85 78  Resp:  20 16 20   Temp:  99 F (37.2 C) 98.6 F (37 C) 98.7 F (37.1 C)  TempSrc:  Oral Oral Oral  SpO2:  96% 94% 95%  Weight: 94.2 kg     Height:        Intake/Output Summary (Last 24 hours) at 07/08/2020 1153 Last data filed at 07/08/2020 0900 Gross per 24 hour  Intake 600 ml  Output 4000 ml  Net -3400 ml   Filed Weights   07/06/20 0500 07/07/20 0500 07/08/20 0500  Weight: 93.7 kg 93 kg 94.2 kg    Physical Exam:  General exam: awake, alert, no acute distress Respiratory system: CTAB, no wheezes, rales or rhonchi, normal respiratory effort. Cardiovascular system: normal S1/S2, RRR, bilateral pedal edema.   Gastrointestinal system: soft, NT, ND, surgical dressings intact clean and dry, +bowel sounds. Central nervous system: A&O x3. no gross focal neurologic deficits, normal speech Extremities: moves all, pedal edema b/l, SCD's in place,  Psychiatry: normal mood, congruent affect, judgement and insight appear normal  Labs   Data Reviewed: I have personally reviewed following labs and imaging studies  CBC: Recent Labs  Lab 07/04/20 0607 07/04/20 0607 07/05/20 0449 07/06/20 0533 07/06/20 1940 07/07/20 0909 07/08/20 0523  WBC 8.1  --  8.4 7.9  --  9.4 9.7  NEUTROABS 6.7  --   --   --   --  7.7 7.9*  HGB 8.5*   < > 8.1*  6.7* 8.0* 8.7* 8.6*  HCT 25.4*   < > 23.8* 21.1* 24.4* 27.0* 26.3*  MCV 85.2  --  85.3 88.7  --  87.4 87.7  PLT 85*  --  83* 75*  --  84* 89*   < > = values in this interval not displayed.   Basic Metabolic Panel: Recent Labs  Lab 07/02/20 0513 07/02/20 0513 07/03/20 0430 07/03/20 0430 07/04/20 2297 07/05/20 0449 07/06/20 0533 07/07/20 0909 07/08/20 0523  NA 135  135   < > 138   < > 132* 134* 135 136 139  K 4.3  4.3   < > 4.5   < > 4.1 4.0 3.7 3.9 3.9  CL 98  98   < > 101   < > 97* 100 102 102 105  CO2 27  27   < > 31   < > 24 25 25 24 23   GLUCOSE 294*  298*   < > 257*   < > 310* 167* 115* 213* 171*  BUN 86*  85*   < > 91*   < > 94* 99* 98* 88* 78*  CREATININE 4.79*  4.72*   < > 4.38*   < > 3.79* 3.35* 2.90* 2.74* 2.29*  CALCIUM 8.5*  8.4*   < > 8.3*   < > 8.3* 8.2* 8.0* 7.8* 7.9*  MG 2.0   < > 1.9  --  1.7 2.1 2.0  --  1.5*  PHOS 3.3  --   --   --  2.1* 2.0* 2.6  --  3.5   < > = values in this interval not displayed.   GFR: Estimated Creatinine Clearance: 23.8 mL/min (A) (by C-G formula based on SCr of 2.29 mg/dL (H)). Liver Function Tests: Recent Labs  Lab 07/02/20 0513 07/04/20 0607 07/08/20 0523  AST  --  12* 45*  ALT  --  9 53*  ALKPHOS  --  111 509*  BILITOT  --  0.5 0.7  PROT  --  4.8* 5.3*  ALBUMIN 2.0* 1.9* 2.0*   No results for input(s): LIPASE, AMYLASE in the last 168 hours. No results for input(s): AMMONIA in the last 168 hours. Coagulation Profile: No results for input(s): INR, PROTIME in the last 168 hours. Cardiac Enzymes: No results for input(s): CKTOTAL, CKMB, CKMBINDEX, TROPONINI in the last 168 hours. BNP (last 3 results) No results for input(s): PROBNP in the last 8760 hours. HbA1C: No results for input(s): HGBA1C in the last 72 hours. CBG: Recent Labs  Lab 07/07/20 1137 07/07/20 1640 07/07/20 2156 07/08/20 0746 07/08/20 1140  GLUCAP 224* 172* 150* 155* 145*   Lipid Profile: No results for input(s): CHOL, HDL, LDLCALC,  TRIG, CHOLHDL, LDLDIRECT in the last 72 hours. Thyroid Function Tests: No results for input(s): TSH, T4TOTAL, FREET4, T3FREE, THYROIDAB in the last 72 hours. Anemia Panel: No results for input(s): VITAMINB12, FOLATE, FERRITIN, TIBC, IRON, RETICCTPCT in the last 72 hours. Sepsis Labs: No results for input(s): PROCALCITON, LATICACIDVEN in the last 168 hours.  No results found for this or any previous visit (from the past 240 hour(s)).    Imaging Studies   No results found.   Medications   Scheduled Meds: . vitamin C  500 mg Oral Daily  . brimonidine  1 drop Both Eyes BID  . chlorhexidine  15 mL Mouth Rinse BID  . Chlorhexidine Gluconate Cloth  6 each Topical Q0600  . diltiazem  120 mg Oral Daily  . epoetin (EPOGEN/PROCRIT) injection  10,000 Units Subcutaneous Weekly  . feeding supplement (ENSURE ENLIVE)  237 mL Oral BID BM  . hydrochlorothiazide  25 mg Oral Daily  . insulin aspart  0-15 Units Subcutaneous TID AC & HS  . mouth rinse  15 mL Mouth Rinse BID  . metoprolol tartrate  50 mg Oral BID  . pantoprazole (PROTONIX) IV  40 mg Intravenous Q24H  . sodium chloride flush  10-40 mL Intracatheter Q12H  . tamsulosin  0.4 mg Oral Daily  . zinc sulfate  220 mg Oral Daily   Continuous Infusions: . sodium chloride 250 mL (07/08/20 0832)       LOS: 21 days    Time spent: 30 minutes    Ezekiel Slocumb, DO [facility]  07/08/2020, 11:53 AM    If 7PM-7AM, please contact night-coverage. How to contact the [facility] Attending or Consulting provider [place] or covering provider during after hours [place], for this patient?    1. Check the care team in [facility] and look for a) attending/consulting [facility] provider listed and b) the [facility] team listed 2. Log into [email or web address] and use [place]'s universal password to access. If you do not have the password, please contact the hospital operator. 3. Locate the [facility] provider you are looking for under [facility] and page to a number  that you can be directly reached. 4. If you still have difficulty reaching the provider, please page the [facility] (Director on Call) for the Hospitalists listed on amion for assistance.

## 2020-07-09 LAB — CBC WITH DIFFERENTIAL/PLATELET
Abs Immature Granulocytes: 0.03 10*3/uL (ref 0.00–0.07)
Basophils Absolute: 0.1 10*3/uL (ref 0.0–0.1)
Basophils Relative: 1 %
Eosinophils Absolute: 0.1 10*3/uL (ref 0.0–0.5)
Eosinophils Relative: 1 %
HCT: 26.8 % — ABNORMAL LOW (ref 36.0–46.0)
Hemoglobin: 8.7 g/dL — ABNORMAL LOW (ref 12.0–15.0)
Immature Granulocytes: 0 %
Lymphocytes Relative: 9 %
Lymphs Abs: 0.7 10*3/uL (ref 0.7–4.0)
MCH: 28.5 pg (ref 26.0–34.0)
MCHC: 32.5 g/dL (ref 30.0–36.0)
MCV: 87.9 fL (ref 80.0–100.0)
Monocytes Absolute: 0.8 10*3/uL (ref 0.1–1.0)
Monocytes Relative: 10 %
Neutro Abs: 6.8 10*3/uL (ref 1.7–7.7)
Neutrophils Relative %: 79 %
Platelets: 109 10*3/uL — ABNORMAL LOW (ref 150–400)
RBC: 3.05 MIL/uL — ABNORMAL LOW (ref 3.87–5.11)
RDW: 16.8 % — ABNORMAL HIGH (ref 11.5–15.5)
WBC: 8.5 10*3/uL (ref 4.0–10.5)
nRBC: 0 % (ref 0.0–0.2)

## 2020-07-09 LAB — COMPREHENSIVE METABOLIC PANEL
ALT: 36 U/L (ref 0–44)
AST: 19 U/L (ref 15–41)
Albumin: 2.1 g/dL — ABNORMAL LOW (ref 3.5–5.0)
Alkaline Phosphatase: 479 U/L — ABNORMAL HIGH (ref 38–126)
Anion gap: 8 (ref 5–15)
BUN: 68 mg/dL — ABNORMAL HIGH (ref 8–23)
CO2: 25 mmol/L (ref 22–32)
Calcium: 8 mg/dL — ABNORMAL LOW (ref 8.9–10.3)
Chloride: 101 mmol/L (ref 98–111)
Creatinine, Ser: 2.02 mg/dL — ABNORMAL HIGH (ref 0.44–1.00)
GFR, Estimated: 23 mL/min — ABNORMAL LOW (ref >60)
Glucose, Bld: 144 mg/dL — ABNORMAL HIGH (ref 70–99)
Potassium: 3.6 mmol/L (ref 3.5–5.1)
Sodium: 134 mmol/L — ABNORMAL LOW (ref 135–145)
Total Bilirubin: 0.7 mg/dL (ref 0.3–1.2)
Total Protein: 5.5 g/dL — ABNORMAL LOW (ref 6.5–8.1)

## 2020-07-09 LAB — GLUCOSE, CAPILLARY
Glucose-Capillary: 143 mg/dL — ABNORMAL HIGH (ref 70–99)
Glucose-Capillary: 220 mg/dL — ABNORMAL HIGH (ref 70–99)
Glucose-Capillary: 219 mg/dL — ABNORMAL HIGH (ref 70–99)
Glucose-Capillary: 155 mg/dL — ABNORMAL HIGH (ref 70–99)

## 2020-07-09 LAB — MAGNESIUM: Magnesium: 1.7 mg/dL (ref 1.7–2.4)

## 2020-07-09 LAB — PHOSPHORUS: Phosphorus: 3.9 mg/dL (ref 2.5–4.6)

## 2020-07-09 MED ORDER — PANTOPRAZOLE SODIUM 40 MG PO TBEC
40.0000 mg | DELAYED_RELEASE_TABLET | Freq: Every day | ORAL | Status: DC
  Administered 2020-07-10: 40 mg via ORAL
  Filled 2020-07-09: qty 1

## 2020-07-09 MED ORDER — DILTIAZEM HCL ER COATED BEADS 120 MG PO CP24
120.0000 mg | ORAL_CAPSULE | Freq: Two times a day (BID) | ORAL | Status: DC
  Administered 2020-07-09: 120 mg via ORAL
  Filled 2020-07-09: qty 1

## 2020-07-09 NOTE — TOC Progression Note (Signed)
Transition of Care [facility]) - Progression Note    Patient Details  Name: JANAYE CORP MRN: [id number] Date of Birth: [birth year]  Transition of Care [facility]) CM/SW Contact  Truitt Merle, LCSW Phone Number: 07/09/2020, 12:59 PM  Clinical Narrative:    Spoke with admission director at [facility], who stated no bed available today, but will available tomorrow (Sunday). Updated attending Dr. Arbutus Ped. TOC continuing to follow for discharge needs.    Expected Discharge Plan: [place] Barriers to Discharge: Continued Medical Work up SNF bed  Expected Discharge Plan and Services Expected Discharge Plan: [place] In-house Referral: NA   Post Acute Care Choice: NA Living arrangements for the past 2 months: Single Family Home                                       Social Determinants of Health (SDOH) Interventions    Readmission Risk Interventions Readmission Risk Prevention Plan 06/21/2020 06/19/2020  Transportation Screening Complete Complete  PCP or Specialist Appt within 3-5 Days Complete -  HRI or Home Care Consult Complete -  Social Work Consult for [place] Planning/Counseling Complete -  Palliative Care Screening Not Applicable -  Medication Review Press photographer) (No Data) Complete  PCP or Specialist appointment within 3-5 days of discharge - Complete  HRI or [place] - Complete  SW Recovery Care/Counseling Consult - Complete  [place] - Not Applicable  Some recent data might be hidden

## 2020-07-09 NOTE — Progress Notes (Signed)

## 2020-07-09 NOTE — Progress Notes (Signed)
PROGRESS NOTE    [name]   SHF:[id number]  DOB: 02-24-1941  PCP: Rusty Aus, MD    DOA: 06/17/2020 LOS: 106   Brief Narrative   Stephanie [name] is a 79 year old female with past medical history notable for type 2 diabetes mellitus, rheumatoid arthritis on chronic immunosuppressive therapy, essential hypertension, history of remote colon cancer and Covid-19 viral infection with positive PCR test June 09, 2020 who presented to the ED with with complaint of fever, progressive shortness of breath, and generalized weakness.   10/6: Postop day 6.  Recovering well overall however had some acute anemia noted this morning.  Possibly due to bleeding with removal of femoral HD catheter.  Transfused 1 unit pRBC's with good response.  Hbg stable since.  10/8-9 now tolerating diet and having bowel movements.  Stable for d/c to rehab when bed is available.     Assessment & Plan   Principal Problem:   Sepsis ([place]) Active Problems:   Benign essential hypertension   Chronic cystitis   Rheumatoid arthritis involving multiple sites with positive rheumatoid factor (HCC)   Diabetes mellitus (HCC)   Rheumatoid arthritis ([place])   COVID-19 virus infection   AKI (acute kidney injury) ([place])   Atrial fibrillation with rapid ventricular response (HCC)   Essential hypertension   Pure hypercholesterolemia   Acute pyelonephritis   Hydronephrosis, right   Lactic acid acidosis   Hyperglycemia   Hx of essential hypertension   Immunosuppression due to drug therapy ([place])   Pressure injury of skin   Acquired thrombophilia ([place])   On continuous oral anticoagulation   SBO (small bowel obstruction) (HCC)   Goals of care, counseling/discussion   Palliative care by specialist   CKD (chronic kidney disease) stage 5, GFR less than 15 ml/min (HCC)   Ileus (HCC)   Paroxysmal A-fib (HCC)   Severe Sepsis, present on admission Klebsiella pneumonia septicemia, POA Right pyelonephritis Patient presented  with fever, T-max 103.0, tachycardia, tachypnea with a WBC count of 20 K with a left shift and lactic acid of 3.1 reflects organ dysfunction. Underwent right ureter stent placement by urology with frank pus noted to kidney.  Blood cultures x2 positive for Klebsiella pneumonia.   Completed 10-day course of IV antibiotics with Zosyn >> Rocephin >> Cipro.  Repeat Blood cultures of 06/21/2020 negative. --Continue monitor fever curve, supportive care  Severe right hydronephrosis CT renal notable for progressive right UPJ obstruction in comparison to prior study dated October 2020.  Urology was consulted and right ureteral stent placed with findings of frank pus in the kidney by Dr. Jeffie Pollock on 06/18/2020. Follow-up renal ultrasound 06/20/2020 with resolved hydronephrosis. --Continue Foley catheter per urology, Dr. Jeffie Pollock --Continue strict I's and O's  Acute renal failure Patient presenting above with severe right hydronephrosis with associated sepsis from Klebsiella pneumonia septicemia/pyelonephritis. Baseline creatinine 0.90 January 2021. Creatinine 1.57 on admission, trended up to a high of 6.33 during admission. Etiology likely secondary to ATN from severe infection as above. --Nephrology following, appreciate assistance --Underwent placement of temporary HD catheter to right common femoral vein by vascular surgery on 06/27/2020.  --Started on dialysis 9/28; right femoral PermCath removed 07/06/2020.  No further plans for inpatient hemodialysis --Cr 6.33>>4.38>3.79>3.35>2.90>2.74>2.29>2.02 today --Avoid nephrotoxins and hypotension (MAP>65), renally dose meds --Closely monitor urinary output --Follow BMP daily  Acute postoperative blood loss anemia Hemoglobin dropped from 8.2 to 7.1 postoperatively on 07/01/2020. Transfused 1 unit PRBC on 07/01/2020.    Transfused additional unit PRBC on 07/06/2020 .  Iron panel with iron 34, TIBC 195, ferritin 605, consistent with anemia of chronic  disease. --Hgb 8.2>7.1>9.1>8.8>8.5>8.1>6.7>8.0>8.7>8.6>8.7 --Goal maintain hemoglobin greater than 8.0 given cardiac history --daily CBC to monitor   Thrombocytopenia Suspect due to sepsis.  Did have bleeding with removal of femoral HD cath.   --Avoid heparin / enoxaparin --SCD's for VTE ppx --monitor CBC  Hypokalemia Hypomagnesemia Replete as necessary --Goal maintainK 4.0 and Mg 2.0 given card hx and afib --Follow electrolytes daily --TPN discontinued  Small bowel obstruction CT abdomen/pelvis with oral contrast notable for high-grade small bowel obstruction with abrupt transition point central lower abdomen, likely secondary to adhesions. Underwent exploratory laparotomy on 06/30/2020 by Dr. Dahlia Byes with extensive lysis of adhesions. --NG tube discontinued 10/3 --TPN discontinued on 07/07/2020 --Diet advanced to soft diet by general surgery on 10/6 - did not tolerate breakfast today 10/8.   --Continue to monitor bowel movements closely --Further per general surgery --Once patient is able to tolerate at least 50% of her meals we can plan on discharge to SNF  Paroxysmal atrial fibrillation with RVR --Cardiology following, appreciate assistance --CHA2DS2-VASc score = 5 (HTN, age, T2DM, gender) --Metoprololtartrate50 mg p.o. twice daily --Cardizem CD 120 mg daily --Discontinued heparin drip 2/2 bleeding surrounding HD catheterand thrombocytopenia --continue to monitor on telemetry --Follow up cardiology recs for A. fib, anticoagulation in setting of bleeding  GERD:Protonix 40 mg IV every 24 hours  Pressure injury of buttock, stage I, POA --Continue local wound care, offloading --Reposition patient every 2 hrs  Hx COVID-19 viral infection Patient fully vaccinated, received Pfizer vaccine on 10/16/2019 and 11/07/2019. Covid-19 PCR positive on 06/09/2020. No overt respiratory symptoms. CT chest without acute pulmonary abnormality. Oxygenating well on room air.  Completed 5-day course of IV remdesivir.  Discontinued airborne/contact isolation since greater than 21 days since initial diagnosis.  History of rheumatoid arthritis Hold leflunomide and topical tacrolimus on hold given acute issues. Will resume these tomorrow.  Hx of Hypertension on metoprolol, diltiazem.  Resume home HCTZ today as BP's uncontrolled.  Obesity: Body mass index is 33.52 kg/m.  Complicates overall care and prognosis  DVT prophylaxis: SCD's   Diet:  Diet Orders (From admission, onward)    Start     Ordered   07/06/20 0817  DIET SOFT Room service appropriate? Yes; Fluid consistency: Thin  Diet effective now       Question Answer Comment  Room service appropriate? Yes   Fluid consistency: Thin      07/06/20 0816            Code Status: DNR    Subjective 07/09/20    Patient seen this AM after breakfast.  She says she tolerated breakfast well.  Started moving bowels yesterday.  No fever/chills.  Says some improvement in her swelling of right arm and feet. No other acute complaints.     Disposition Plan & Communication   Status is: Inpatient  Remains inpatient appropriate because: Unsafe discharge due to profound generalized weakness, awaiting SNF bed.     Dispo:  Patient From: Home  Planned Disposition: [place]  Expected discharge date: 07/10/20  Medically stable for discharge: Yes    Family Communication: None at bedside, will attempt to call    Consults, Procedures, Significant Events   Consultants:   General Surgery  Nephrology  Cardiology  Procedures:   Ex-lap, lysis of adhesions  Antimicrobials:  Anti-infectives (From admission, onward)   Start     Dose/Rate Route Frequency Ordered Stop   06/30/20 0600  cefoTEtan (CEFOTAN) 2 g in sodium chloride 0.9 % 100 mL IVPB        2 g 200 mL/hr over 30 Minutes Intravenous On call to O.R. 06/29/20 1302 06/30/20 1322   06/26/20 1000  cefTRIAXone (ROCEPHIN) 2 g in sodium  chloride 0.9 % 100 mL IVPB        2 g 200 mL/hr over 30 Minutes Intravenous Every 24 hours 06/25/20 1138 06/26/20 1112   06/25/20 1000  ciprofloxacin (CIPRO) IVPB 400 mg  Status:  Discontinued        400 mg 200 mL/hr over 60 Minutes Intravenous Every 24 hours 06/24/20 1234 06/26/20 0919   06/23/20 1000  ciprofloxacin (CIPRO) tablet 500 mg  Status:  Discontinued        500 mg Oral Daily 06/23/20 0927 06/24/20 1234   06/20/20 1600  piperacillin-tazobactam (ZOSYN) IVPB 3.375 g  Status:  Discontinued        3.375 g 12.5 mL/hr over 240 Minutes Intravenous Every 12 hours 06/20/20 1217 06/23/20 0926   06/19/20 2000  piperacillin-tazobactam (ZOSYN) IVPB 3.375 g  Status:  Discontinued        3.375 g 12.5 mL/hr over 240 Minutes Intravenous Every 8 hours 06/19/20 1915 06/20/20 1217   06/18/20 1800  cefTRIAXone (ROCEPHIN) 2 g in sodium chloride 0.9 % 100 mL IVPB  Status:  Discontinued        2 g 200 mL/hr over 30 Minutes Intravenous Every 24 hours 06/17/20 1545 06/17/20 1545   06/18/20 1000  remdesivir 100 mg in sodium chloride 0.9 % 100 mL IVPB       "Followed by" Linked Group Details   100 mg 200 mL/hr over 30 Minutes Intravenous Daily 06/17/20 0809 06/21/20 0954   06/18/20 0800  azithromycin (ZITHROMAX) 500 mg in sodium chloride 0.9 % 250 mL IVPB  Status:  Discontinued        500 mg 250 mL/hr over 60 Minutes Intravenous Every 24 hours 06/17/20 0805 06/17/20 0914   06/17/20 1800  cefTRIAXone (ROCEPHIN) 2 g in sodium chloride 0.9 % 100 mL IVPB  Status:  Discontinued        2 g 200 mL/hr over 30 Minutes Intravenous Every 24 hours 06/17/20 1545 06/19/20 1915   06/17/20 1500  cefTRIAXone (ROCEPHIN) 2 g in sodium chloride 0.9 % 100 mL IVPB  Status:  Discontinued        2 g 200 mL/hr over 30 Minutes Intravenous Every 24 hours 06/17/20 0805 06/17/20 1545   06/17/20 1000  remdesivir 200 mg in sodium chloride 0.9% 250 mL IVPB       "Followed by" Linked Group Details   200 mg 580 mL/hr over 30 Minutes  Intravenous Once 06/17/20 0809 06/17/20 1216   06/17/20 0530  ceFEPIme (MAXIPIME) 1 g in sodium chloride 0.9 % 100 mL IVPB        1 g 200 mL/hr over 30 Minutes Intravenous  Once 06/17/20 0515 06/17/20 0810   06/17/20 0530  vancomycin (VANCOCIN) IVPB 1000 mg/200 mL premix        1,000 mg 200 mL/hr over 60 Minutes Intravenous  Once 06/17/20 0515 06/17/20 0929   06/17/20 0530  azithromycin (ZITHROMAX) tablet 500 mg        500 mg Oral  Once 06/17/20 0515 06/17/20 0723         Objective   Vitals:   07/09/20 0030 07/09/20 0404 07/09/20 0828 07/09/20 1134  BP: (!) 155/65 (!) 169/75 (!) 176/83 (!) 165/79  Pulse:  84 85 74  Resp: 18 18 18 16   Temp: 98.8 F (37.1 C) 98.8 F (37.1 C) 98.7 F (37.1 C) 98.7 F (37.1 C)  TempSrc: Oral Oral Oral Oral  SpO2: 95% 95% 94% 96%  Weight:      Height:        Intake/Output Summary (Last 24 hours) at 07/09/2020 1356 Last data filed at 07/09/2020 1100 Gross per 24 hour  Intake 412.67 ml  Output 1375 ml  Net -962.33 ml   Filed Weights   07/06/20 0500 07/07/20 0500 07/08/20 0500  Weight: 93.7 kg 93 kg 94.2 kg    Physical Exam:  General exam: awake, alert, no acute distress Respiratory system: CTAB, no wheezes, rales or rhonchi, normal respiratory effort. Cardiovascular system: normal S1/S2, RRR, bilateral pedal edema.   Gastrointestinal system: soft, NT, ND, +bowel sounds. Central nervous system: A&O x3. no gross focal neurologic deficits, normal speech Extremities: RUE edema slightly improved, R hand ecchymosis stable mildly improved, pedal edema improved b/l, SCD's in place Psychiatry: normal mood, congruent affect, judgement and insight appear normal  Labs   Data Reviewed: I have personally reviewed following labs and imaging studies  CBC: Recent Labs  Lab 07/04/20 0607 07/04/20 0607 07/05/20 0449 07/05/20 0449 07/06/20 0533 07/06/20 1940 07/07/20 0909 07/08/20 0523 07/09/20 0803  WBC 8.1   < > 8.4  --  7.9  --  9.4 9.7 8.5   NEUTROABS 6.7  --   --   --   --   --  7.7 7.9* 6.8  HGB 8.5*   < > 8.1*   < > 6.7* 8.0* 8.7* 8.6* 8.7*  HCT 25.4*   < > 23.8*   < > 21.1* 24.4* 27.0* 26.3* 26.8*  MCV 85.2   < > 85.3  --  88.7  --  87.4 87.7 87.9  PLT 85*   < > 83*  --  75*  --  84* 89* 109*   < > = values in this interval not displayed.   Basic Metabolic Panel: Recent Labs  Lab 07/04/20 0607 07/04/20 0607 07/05/20 0449 07/06/20 0533 07/07/20 0909 07/08/20 0523 07/09/20 0803  NA 132*   < > 134* 135 136 139 134*  K 4.1   < > 4.0 3.7 3.9 3.9 3.6  CL 97*   < > 100 102 102 105 101  CO2 24   < > 25 25 24 23 25   GLUCOSE 310*   < > 167* 115* 213* 171* 144*  BUN 94*   < > 99* 98* 88* 78* 68*  CREATININE 3.79*   < > 3.35* 2.90* 2.74* 2.29* 2.02*  CALCIUM 8.3*   < > 8.2* 8.0* 7.8* 7.9* 8.0*  MG 1.7  --  2.1 2.0  --  1.5* 1.7  PHOS 2.1*  --  2.0* 2.6  --  3.5 3.9   < > = values in this interval not displayed.   GFR: Estimated Creatinine Clearance: 27 mL/min (A) (by C-G formula based on SCr of 2.02 mg/dL (H)). Liver Function Tests: Recent Labs  Lab 07/04/20 0607 07/08/20 0523 07/09/20 0803  AST 12* 45* 19  ALT 9 53* 36  ALKPHOS 111 509* 479*  BILITOT 0.5 0.7 0.7  PROT 4.8* 5.3* 5.5*  ALBUMIN 1.9* 2.0* 2.1*   No results for input(s): LIPASE, AMYLASE in the last 168 hours. No results for input(s): AMMONIA in the last 168 hours. Coagulation Profile: No results for input(s): INR, PROTIME in the last 168 hours. Cardiac  Enzymes: No results for input(s): CKTOTAL, CKMB, CKMBINDEX, TROPONINI in the last 168 hours. BNP (last 3 results) No results for input(s): PROBNP in the last 8760 hours. HbA1C: No results for input(s): HGBA1C in the last 72 hours. CBG: Recent Labs  Lab 07/08/20 1140 07/08/20 1630 07/08/20 2149 07/09/20 0752 07/09/20 1131  GLUCAP 145* 151* 166* 143* 220*   Lipid Profile: No results for input(s): CHOL, HDL, LDLCALC, TRIG, CHOLHDL, LDLDIRECT in the last 72 hours. Thyroid Function  Tests: No results for input(s): TSH, T4TOTAL, FREET4, T3FREE, THYROIDAB in the last 72 hours. Anemia Panel: No results for input(s): VITAMINB12, FOLATE, FERRITIN, TIBC, IRON, RETICCTPCT in the last 72 hours. Sepsis Labs: No results for input(s): PROCALCITON, LATICACIDVEN in the last 168 hours.  No results found for this or any previous visit (from the past 240 hour(s)).    Imaging Studies   No results found.   Medications   Scheduled Meds: . vitamin C  500 mg Oral Daily  . brimonidine  1 drop Both Eyes BID  . chlorhexidine  15 mL Mouth Rinse BID  . Chlorhexidine Gluconate Cloth  6 each Topical Q0600  . diltiazem  120 mg Oral Daily  . epoetin (EPOGEN/PROCRIT) injection  10,000 Units Subcutaneous Weekly  . hydrochlorothiazide  25 mg Oral Daily  . insulin aspart  0-15 Units Subcutaneous TID AC & HS  . mouth rinse  15 mL Mouth Rinse BID  . metoprolol tartrate  50 mg Oral BID  . [START ON 07/10/2020] pantoprazole  40 mg Oral Daily  . Ensure Max Protein  11 oz Oral Daily  . tamsulosin  0.4 mg Oral Daily  . zinc sulfate  220 mg Oral Daily   Continuous Infusions: . sodium chloride Stopped (07/08/20 1555)       LOS: 22 days    Time spent: 25 minutes    Ezekiel Slocumb, DO [facility]  07/09/2020, 1:56 PM    If 7PM-7AM, please contact night-coverage. How to contact the [facility] Attending or Consulting provider [place] or covering provider during after hours [place], for this patient?    1. Check the care team in [facility] and look for a) attending/consulting [facility] provider listed and b) the [facility] team listed 2. Log into [email or web address] and use [place]'s universal password to access. If you do not have the password, please contact the hospital operator. 3. Locate the [facility] provider you are looking for under [facility] and page to a number that you can be directly reached. 4. If you still have difficulty reaching the provider, please page the [facility] (Director on Call) for  the Hospitalists listed on amion for assistance.

## 2020-07-09 NOTE — Progress Notes (Signed)
[facility]  ROUNDING NOTE   Subjective:   Husband at bedside.  UOP 2575.   On hydrochlorothiazide.   Objective:  Vital signs in last 24 hours:  Temp:  [98.4 F (36.9 C)-99 F (37.2 C)] 98.7 F (37.1 C) (10/09 1134) Pulse Rate:  [74-85] 74 (10/09 1134) Resp:  [16-20] 16 (10/09 1134) BP: (155-176)/(65-83) 165/79 (10/09 1134) SpO2:  [94 %-96 %] 96 % (10/09 1134)  Weight change:  Filed Weights   07/06/20 0500 07/07/20 0500 07/08/20 0500  Weight: 93.7 kg 93 kg 94.2 kg    Intake/Output: I/O last 3 completed shifts: In: 892.7 [P.O.:840; I.V.:52.7] Out: 5375 [Urine:5375]   Intake/Output this shift:  Total I/O In: 120 [P.O.:120] Out: -   Physical Exam: General: NAD,   Head: Normocephalic, atraumatic. Moist oral mucosal membranes  Eyes: Anicteric, PERRL  Neck: Supple, trachea midline  Lungs:  Clear to auscultation  Heart: Regular rate and rhythm  Abdomen:  Soft, nontender,   Extremities: + peripheral edema.  Neurologic: Nonfocal, moving all four extremities  Skin: No lesions  Access: none    Basic Metabolic Panel: Recent Labs  Lab 07/04/20 0607 07/04/20 0607 07/05/20 0449 07/05/20 0449 07/06/20 0533 07/06/20 0533 07/07/20 0909 07/08/20 0523 07/09/20 0803  NA 132*   < > 134*  --  135  --  136 139 134*  K 4.1   < > 4.0  --  3.7  --  3.9 3.9 3.6  CL 97*   < > 100  --  102  --  102 105 101  CO2 24   < > 25  --  25  --  24 23 25   GLUCOSE 310*   < > 167*  --  115*  --  213* 171* 144*  BUN 94*   < > 99*  --  98*  --  88* 78* 68*  CREATININE 3.79*   < > 3.35*  --  2.90*  --  2.74* 2.29* 2.02*  CALCIUM 8.3*   < > 8.2*   < > 8.0*   < > 7.8* 7.9* 8.0*  MG 1.7  --  2.1  --  2.0  --   --  1.5* 1.7  PHOS 2.1*  --  2.0*  --  2.6  --   --  3.5 3.9   < > = values in this interval not displayed.    Liver Function Tests: Recent Labs  Lab 07/04/20 0607 07/08/20 0523 07/09/20 0803  AST 12* 45* 19  ALT 9 53* 36  ALKPHOS 111 509* 479*  BILITOT 0.5  0.7 0.7  PROT 4.8* 5.3* 5.5*  ALBUMIN 1.9* 2.0* 2.1*   No results for input(s): LIPASE, AMYLASE in the last 168 hours. No results for input(s): AMMONIA in the last 168 hours.  CBC: Recent Labs  Lab 07/04/20 0607 07/04/20 0607 07/05/20 0449 07/05/20 0449 07/06/20 0533 07/06/20 1940 07/07/20 0909 07/08/20 0523 07/09/20 0803  WBC 8.1   < > 8.4  --  7.9  --  9.4 9.7 8.5  NEUTROABS 6.7  --   --   --   --   --  7.7 7.9* 6.8  HGB 8.5*   < > 8.1*   < > 6.7* 8.0* 8.7* 8.6* 8.7*  HCT 25.4*   < > 23.8*   < > 21.1* 24.4* 27.0* 26.3* 26.8*  MCV 85.2   < > 85.3  --  88.7  --  87.4 87.7 87.9  PLT 85*   < >  83*  --  75*  --  84* 89* 109*   < > = values in this interval not displayed.    Cardiac Enzymes: No results for input(s): CKTOTAL, CKMB, CKMBINDEX, TROPONINI in the last 168 hours.  BNP: Invalid input(s): POCBNP  CBG: Recent Labs  Lab 07/08/20 1140 07/08/20 1630 07/08/20 2149 07/09/20 0752 07/09/20 1131  GLUCAP 145* 151* 166* 143* 220*    Microbiology: Results for orders placed or performed during the hospital encounter of 06/17/20  Blood culture (routine x 2)     Status: Abnormal   Collection Time: 06/17/20  3:57 AM   Specimen: BLOOD  Result Value Ref Range Status   Specimen Description   Final    BLOOD LEFT HAND Performed at [facility], [address] [place], [place] [address]    Special Requests   Final    BOTTLES DRAWN AEROBIC AND ANAEROBIC Blood Culture adequate volume Performed at [facility], [address] [place], [place] [address]    Culture  Setup Time   Final    Organism ID to follow GRAM NEGATIVE RODS IN BOTH AEROBIC AND ANAEROBIC BOTTLES CRITICAL RESULT CALLED TO, READ BACK BY AND VERIFIED WITH: AMY THOMPSON AT 1534 ON 06/17/20 [place] Performed at [facility], [address] [place], [place] [address]    Culture KLEBSIELLA PNEUMONIAE (A)  Final   Report Status 06/19/2020 FINAL  Final   Organism ID, Bacteria  KLEBSIELLA PNEUMONIAE  Final      Susceptibility   Klebsiella pneumoniae - MIC*    AMPICILLIN RESISTANT Resistant     CEFAZOLIN <=4 SENSITIVE Sensitive     CEFEPIME <=0.12 SENSITIVE Sensitive     CEFTAZIDIME <=1 SENSITIVE Sensitive     CEFTRIAXONE <=0.25 SENSITIVE Sensitive     CIPROFLOXACIN <=0.25 SENSITIVE Sensitive     GENTAMICIN <=1 SENSITIVE Sensitive     IMIPENEM <=0.25 SENSITIVE Sensitive     TRIMETH/SULFA <=20 SENSITIVE Sensitive     AMPICILLIN/SULBACTAM 4 SENSITIVE Sensitive     PIP/TAZO <=4 SENSITIVE Sensitive     * KLEBSIELLA PNEUMONIAE  Blood culture (routine x 2)     Status: Abnormal   Collection Time: 06/17/20  3:57 AM   Specimen: BLOOD  Result Value Ref Range Status   Specimen Description   Final    BLOOD RIGHT [facility] Performed at [facility], [address], [place], [place] [address]    Special Requests   Final    BOTTLES DRAWN AEROBIC AND ANAEROBIC Blood Culture adequate volume Performed at [facility], [address] [place], [place] [address]    Culture  Setup Time   Final    GRAM NEGATIVE RODS IN BOTH AEROBIC AND ANAEROBIC BOTTLES CRITICAL VALUE NOTED.  VALUE IS CONSISTENT WITH PREVIOUSLY REPORTED AND CALLED VALUE. Performed at [facility], [address] [place], [place] [address]    Culture (A)  Final    KLEBSIELLA PNEUMONIAE SUSCEPTIBILITIES PERFORMED ON PREVIOUS CULTURE WITHIN THE LAST 5 DAYS. Performed at [facility], [place] [address], [place], [place] [address]    Report Status 06/19/2020 FINAL  Final  Blood Culture ID Panel (Reflexed)     Status: Abnormal   Collection Time: 06/17/20  3:57 AM  Result Value Ref Range Status   Enterococcus faecalis NOT DETECTED NOT DETECTED Final   Enterococcus Faecium NOT DETECTED NOT DETECTED Final   Listeria monocytogenes NOT DETECTED NOT DETECTED Final   Staphylococcus species NOT DETECTED NOT DETECTED Final  Staphylococcus aureus (BCID) NOT DETECTED NOT DETECTED Final    Staphylococcus epidermidis NOT DETECTED NOT DETECTED Final   Staphylococcus lugdunensis NOT DETECTED NOT DETECTED Final   Streptococcus species NOT DETECTED NOT DETECTED Final   Streptococcus agalactiae NOT DETECTED NOT DETECTED Final   Streptococcus pneumoniae NOT DETECTED NOT DETECTED Final   Streptococcus pyogenes NOT DETECTED NOT DETECTED Final   A.calcoaceticus-baumannii NOT DETECTED NOT DETECTED Final   Bacteroides fragilis NOT DETECTED NOT DETECTED Final   Enterobacterales DETECTED (A) NOT DETECTED Final    Comment: Enterobacterales represent a large order of gram negative bacteria, not a single organism. CRITICAL RESULT CALLED TO, READ BACK BY AND VERIFIED WITH: AMY THOMPSON AT 9735 ON 06/17/20 SNG    Enterobacter cloacae complex NOT DETECTED NOT DETECTED Final   Escherichia coli NOT DETECTED NOT DETECTED Final   Klebsiella aerogenes NOT DETECTED NOT DETECTED Final   Klebsiella oxytoca NOT DETECTED NOT DETECTED Final   Klebsiella pneumoniae DETECTED (A) NOT DETECTED Final    Comment: CRITICAL RESULT CALLED TO, READ BACK BY AND VERIFIED WITH: AMY THOMPSON RN AT 3299 ON 06/17/20 SNG    Proteus species NOT DETECTED NOT DETECTED Final   Salmonella species NOT DETECTED NOT DETECTED Final   Serratia marcescens NOT DETECTED NOT DETECTED Final   Haemophilus influenzae NOT DETECTED NOT DETECTED Final   Neisseria meningitidis NOT DETECTED NOT DETECTED Final   Pseudomonas aeruginosa NOT DETECTED NOT DETECTED Final   Stenotrophomonas maltophilia NOT DETECTED NOT DETECTED Final   Candida albicans NOT DETECTED NOT DETECTED Final   Candida auris NOT DETECTED NOT DETECTED Final   Candida glabrata NOT DETECTED NOT DETECTED Final   Candida krusei NOT DETECTED NOT DETECTED Final   Candida parapsilosis NOT DETECTED NOT DETECTED Final   Candida tropicalis NOT DETECTED NOT DETECTED Final   Cryptococcus neoformans/gattii NOT DETECTED NOT DETECTED Final   CTX-M ESBL NOT DETECTED NOT DETECTED Final    Carbapenem resistance IMP NOT DETECTED NOT DETECTED Final   Carbapenem resistance KPC NOT DETECTED NOT DETECTED Final   Carbapenem resistance NDM NOT DETECTED NOT DETECTED Final   Carbapenem resist OXA 48 LIKE NOT DETECTED NOT DETECTED Final   Carbapenem resistance VIM NOT DETECTED NOT DETECTED Final    Comment: Performed at [facility], [address], [place], [place] [address]  Urine Culture     Status: Abnormal   Collection Time: 06/17/20 11:01 AM   Specimen: Urine, Random  Result Value Ref Range Status   Specimen Description   Final    URINE, RANDOM Performed at [facility], [address] [place], [place] [address]    Special Requests   Final    NONE Performed at [facility], [address] [place], [place] [address]    Culture >=100,000 COLONIES/mL KLEBSIELLA PNEUMONIAE (A)  Final   Report Status 06/19/2020 FINAL  Final   Organism ID, Bacteria KLEBSIELLA PNEUMONIAE (A)  Final      Susceptibility   Klebsiella pneumoniae - MIC*    AMPICILLIN RESISTANT Resistant     CEFAZOLIN <=4 SENSITIVE Sensitive     CEFTRIAXONE <=0.25 SENSITIVE Sensitive     CIPROFLOXACIN <=0.25 SENSITIVE Sensitive     GENTAMICIN <=1 SENSITIVE Sensitive     IMIPENEM <=0.25 SENSITIVE Sensitive     NITROFURANTOIN 32 SENSITIVE Sensitive     TRIMETH/SULFA <=20 SENSITIVE Sensitive     AMPICILLIN/SULBACTAM 4 SENSITIVE Sensitive     PIP/TAZO <=4 SENSITIVE Sensitive     * >=100,000 COLONIES/mL KLEBSIELLA PNEUMONIAE  Culture, blood (Routine X 2) w Reflex to ID Panel     Status: None   Collection Time: 06/21/20 11:07 AM   Specimen: BLOOD  Result Value Ref Range Status   Specimen Description BLOOD RIGHT [facility]  Final   Special Requests   Final    BOTTLES DRAWN AEROBIC AND ANAEROBIC Blood Culture adequate volume   Culture   Final    NO GROWTH 5 DAYS Performed at [facility], [address] [place], [place] [address]    Report Status 06/26/2020 FINAL  Final   Culture, blood (Routine X 2) w Reflex to ID Panel     Status: None   Collection Time: 06/21/20 11:09 AM   Specimen: BLOOD  Result Value Ref Range Status   Specimen Description BLOOD [place]  Final   Special Requests   Final    BOTTLES DRAWN AEROBIC AND ANAEROBIC Blood Culture adequate volume   Culture   Final    NO GROWTH 5 DAYS Performed at [facility], [address] [place], [place] [address]    Report Status 06/26/2020 FINAL  Final    Coagulation Studies: No results for input(s): LABPROT, INR in the last 72 hours.  Urinalysis: No results for input(s): COLORURINE, LABSPEC, PHURINE, GLUCOSEU, HGBUR, BILIRUBINUR, KETONESUR, PROTEINUR, UROBILINOGEN, NITRITE, LEUKOCYTESUR in the last 72 hours.  Invalid input(s): APPERANCEUR    Imaging: No results found.   Medications:   . sodium chloride Stopped (07/08/20 1555)   . vitamin C  500 mg Oral Daily  . brimonidine  1 drop Both Eyes BID  . chlorhexidine  15 mL Mouth Rinse BID  . Chlorhexidine Gluconate Cloth  6 each Topical Q0600  . diltiazem  120 mg Oral Daily  . epoetin (EPOGEN/PROCRIT) injection  10,000 Units Subcutaneous Weekly  . hydrochlorothiazide  25 mg Oral Daily  . insulin aspart  0-15 Units Subcutaneous TID AC & HS  . mouth rinse  15 mL Mouth Rinse BID  . metoprolol tartrate  50 mg Oral BID  . [START ON 07/10/2020] pantoprazole  40 mg Oral Daily  . Ensure Max Protein  11 oz Oral Daily  . tamsulosin  0.4 mg Oral Daily  . zinc sulfate  220 mg Oral Daily   sodium chloride, acetaminophen, albuterol, guaiFENesin-dextromethorphan, heparin, labetalol, morphine injection, ondansetron (ZOFRAN) IV  Assessment/ Plan:  Ms. LORELIE BIERMANN is a 79 y.o. white female with diabetes, COPD, hypertension, glaucoma, history of colon cancer, GERD who was admitted to [facility] on 06/17/2020 for COVID pneumonia, urinary tract infection with klebsiella. Right ureteral stent was placed on 9/18. Patient underwent exlap with lysis of  adhesions on 9/30.   1. Acute kidney injury: baseline creatinine 0.9 in 10/2019.  IV contrast on 9/17.  Patient did require hemodialysis.  - kidney function improving. No indication for further dialysis  2. Anemia with renal failure: hemoglobin 8.7. normocytic.  - EPO last given on 10/8 sub cu  3. Urinary tract infection with obstructive uropathy. Completed antibiotics.   4. Hypertension: elevated, 165/79. Restarted on home medications: hydrochlorothiazide, metoprolol, tamsulosin and diltazem Currently holding amlodipine.   5. Diabetes mellitus type II with renal manifestations:  Holding metformin and glipizide.    LOS: 22 [place] 10/9/20211:43 PM

## 2020-07-10 LAB — CBC WITH DIFFERENTIAL/PLATELET
Abs Immature Granulocytes: 0.03 10*3/uL (ref 0.00–0.07)
Basophils Absolute: 0.1 10*3/uL (ref 0.0–0.1)
Basophils Relative: 1 %
Eosinophils Absolute: 0.1 10*3/uL (ref 0.0–0.5)
Eosinophils Relative: 1 %
HCT: 27.9 % — ABNORMAL LOW (ref 36.0–46.0)
Hemoglobin: 9.2 g/dL — ABNORMAL LOW (ref 12.0–15.0)
Immature Granulocytes: 0 %
Lymphocytes Relative: 8 %
Lymphs Abs: 0.7 10*3/uL (ref 0.7–4.0)
MCH: 28.8 pg (ref 26.0–34.0)
MCHC: 33 g/dL (ref 30.0–36.0)
MCV: 87.2 fL (ref 80.0–100.0)
Monocytes Absolute: 0.9 10*3/uL (ref 0.1–1.0)
Monocytes Relative: 10 %
Neutro Abs: 7.6 10*3/uL (ref 1.7–7.7)
Neutrophils Relative %: 80 %
Platelets: 125 10*3/uL — ABNORMAL LOW (ref 150–400)
RBC: 3.2 MIL/uL — ABNORMAL LOW (ref 3.87–5.11)
RDW: 16.4 % — ABNORMAL HIGH (ref 11.5–15.5)
WBC: 9.4 10*3/uL (ref 4.0–10.5)
nRBC: 0 % (ref 0.0–0.2)

## 2020-07-10 LAB — COMPREHENSIVE METABOLIC PANEL
ALT: 49 U/L — ABNORMAL HIGH (ref 0–44)
AST: 37 U/L (ref 15–41)
Albumin: 2.1 g/dL — ABNORMAL LOW (ref 3.5–5.0)
Alkaline Phosphatase: 617 U/L — ABNORMAL HIGH (ref 38–126)
Anion gap: 11 (ref 5–15)
BUN: 60 mg/dL — ABNORMAL HIGH (ref 8–23)
CO2: 26 mmol/L (ref 22–32)
Calcium: 8 mg/dL — ABNORMAL LOW (ref 8.9–10.3)
Chloride: 101 mmol/L (ref 98–111)
Creatinine, Ser: 1.94 mg/dL — ABNORMAL HIGH (ref 0.44–1.00)
GFR, Estimated: 24 mL/min — ABNORMAL LOW (ref >60)
Glucose, Bld: 144 mg/dL — ABNORMAL HIGH (ref 70–99)
Potassium: 3.3 mmol/L — ABNORMAL LOW (ref 3.5–5.1)
Sodium: 138 mmol/L (ref 135–145)
Total Bilirubin: 0.5 mg/dL (ref 0.3–1.2)
Total Protein: 5.4 g/dL — ABNORMAL LOW (ref 6.5–8.1)

## 2020-07-10 LAB — GLUCOSE, CAPILLARY
Glucose-Capillary: 129 mg/dL — ABNORMAL HIGH (ref 70–99)
Glucose-Capillary: 214 mg/dL — ABNORMAL HIGH (ref 70–99)

## 2020-07-10 LAB — PHOSPHORUS: Phosphorus: 3.1 mg/dL (ref 2.5–4.6)

## 2020-07-10 LAB — MAGNESIUM: Magnesium: 1.5 mg/dL — ABNORMAL LOW (ref 1.7–2.4)

## 2020-07-10 MED ORDER — AMLODIPINE BESYLATE 5 MG PO TABS
5.0000 mg | ORAL_TABLET | Freq: Every day | ORAL | Status: DC
  Administered 2020-07-10: 5 mg via ORAL
  Filled 2020-07-10: qty 1

## 2020-07-10 MED ORDER — DILTIAZEM HCL ER COATED BEADS 120 MG PO CP24: 240.0000 mg | ORAL_CAPSULE | Freq: Two times a day (BID) | ORAL | Status: DC

## 2020-07-10 MED ORDER — DILTIAZEM HCL ER COATED BEADS 120 MG PO CP24
120.0000 mg | ORAL_CAPSULE | Freq: Two times a day (BID) | ORAL | Status: DC
  Administered 2020-07-10: 120 mg via ORAL
  Filled 2020-07-10: qty 1

## 2020-07-10 MED ORDER — MAGNESIUM OXIDE 400 (241.3 MG) MG PO TABS: 400.0000 mg | ORAL_TABLET | Freq: Two times a day (BID) | ORAL | Status: DC

## 2020-07-10 MED ORDER — POTASSIUM CHLORIDE CRYS ER 20 MEQ PO TBCR
40.0000 meq | EXTENDED_RELEASE_TABLET | ORAL | Status: AC
  Administered 2020-07-10 (×2): 40 meq via ORAL
  Filled 2020-07-10 (×2): qty 2

## 2020-07-10 MED ORDER — PREDNISONE 20 MG PO TABS: 40.0000 mg | ORAL_TABLET | Freq: Every day | ORAL | 0 refills | Status: AC

## 2020-07-10 MED ORDER — ACETAMINOPHEN 325 MG PO TABS: 650.0000 mg | ORAL_TABLET | Freq: Four times a day (QID) | ORAL | Status: DC | PRN

## 2020-07-10 MED ORDER — PANTOPRAZOLE SODIUM 40 MG PO TBEC: 40.0000 mg | DELAYED_RELEASE_TABLET | Freq: Every day | ORAL | Status: DC

## 2020-07-10 MED ORDER — MAGNESIUM SULFATE 4 GM/100ML IV SOLN
4.0000 g | Freq: Once | INTRAVENOUS | Status: AC
  Administered 2020-07-10: 4 g via INTRAVENOUS
  Filled 2020-07-10: qty 100

## 2020-07-10 MED ORDER — ENSURE MAX PROTEIN PO LIQD: 11.0000 [oz_av] | Freq: Every day | ORAL | Status: DC

## 2020-07-10 MED ORDER — ASCORBIC ACID 500 MG PO TABS: 500.0000 mg | ORAL_TABLET | Freq: Every day | ORAL | Status: DC

## 2020-07-10 MED ORDER — ZINC SULFATE 220 (50 ZN) MG PO CAPS: 220.0000 mg | ORAL_CAPSULE | Freq: Every day | ORAL | Status: DC

## 2020-07-10 MED ORDER — DILTIAZEM HCL ER COATED BEADS 120 MG PO CP24: 120.0000 mg | ORAL_CAPSULE | Freq: Two times a day (BID) | ORAL | Status: DC

## 2020-07-10 MED ORDER — INSULIN ASPART 100 UNIT/ML ~~LOC~~ SOLN: 0.0000 [IU] | Freq: Three times a day (TID) | SUBCUTANEOUS | 11 refills | Status: DC

## 2020-07-10 NOTE — Progress Notes (Signed)
[facility]  ROUNDING NOTE   Subjective:   Husband at bedside.  UOP 5447mL  On hydrochlorothiazide.   Objective:  Vital signs in last 24 hours:  Temp:  [98.4 F (36.9 C)-99 F (37.2 C)] 98.6 F (37 C) (10/10 0746) Pulse Rate:  [67-81] 80 (10/10 0746) Resp:  [16-18] 18 (10/10 0746) BP: (151-165)/(67-79) 154/71 (10/10 0746) SpO2:  [94 %-97 %] 96 % (10/10 0746) Weight:  [86 kg] 86 kg (10/10 0559)  Weight change:  Filed Weights   07/07/20 0500 07/08/20 0500 07/10/20 0559  Weight: 93 kg 94.2 kg 86 kg    Intake/Output: I/O last 3 completed shifts: In: 240 [P.O.:240] Out: 5430 [Urine:5430]   Intake/Output this shift:  Total I/O In: 240 [P.O.:240] Out: -   Physical Exam: General: NAD,   Head: Normocephalic, atraumatic. Moist oral mucosal membranes  Eyes: Anicteric, PERRL  Neck: Supple, trachea midline  Lungs:  Clear to auscultation  Heart: Regular rate and rhythm  Abdomen:  Soft, nontender,   Extremities: + peripheral edema.  Neurologic: Nonfocal, moving all four extremities  Skin: No lesions  Access: none    Basic Metabolic Panel: Recent Labs  Lab 07/05/20 0449 07/05/20 0449 07/06/20 0533 07/06/20 0533 07/07/20 0909 07/07/20 0909 07/08/20 0523 07/09/20 0803 07/10/20 0556  NA 134*   < > 135  --  136  --  139 134* 138  K 4.0   < > 3.7  --  3.9  --  3.9 3.6 3.3*  CL 100   < > 102  --  102  --  105 101 101  CO2 25   < > 25  --  24  --  23 25 26   GLUCOSE 167*   < > 115*  --  213*  --  171* 144* 144*  BUN 99*   < > 98*  --  88*  --  78* 68* 60*  CREATININE 3.35*   < > 2.90*  --  2.74*  --  2.29* 2.02* 1.94*  CALCIUM 8.2*   < > 8.0*   < > 7.8*   < > 7.9* 8.0* 8.0*  MG 2.1  --  2.0  --   --   --  1.5* 1.7 1.5*  PHOS 2.0*  --  2.6  --   --   --  3.5 3.9 3.1   < > = values in this interval not displayed.    Liver Function Tests: Recent Labs  Lab 07/04/20 0607 07/08/20 0523 07/09/20 0803 07/10/20 0556  AST 12* 45* 19 37  ALT 9 53* 36  49*  ALKPHOS 111 509* 479* 617*  BILITOT 0.5 0.7 0.7 0.5  PROT 4.8* 5.3* 5.5* 5.4*  ALBUMIN 1.9* 2.0* 2.1* 2.1*   No results for input(s): LIPASE, AMYLASE in the last 168 hours. No results for input(s): AMMONIA in the last 168 hours.  CBC: Recent Labs  Lab 07/04/20 0607 07/05/20 0449 07/06/20 0533 07/06/20 0533 07/06/20 1940 07/07/20 0909 07/08/20 0523 07/09/20 0803 07/10/20 0556  WBC 8.1   < > 7.9  --   --  9.4 9.7 8.5 9.4  NEUTROABS 6.7  --   --   --   --  7.7 7.9* 6.8 7.6  HGB 8.5*   < > 6.7*   < > 8.0* 8.7* 8.6* 8.7* 9.2*  HCT 25.4*   < > 21.1*   < > 24.4* 27.0* 26.3* 26.8* 27.9*  MCV 85.2   < > 88.7  --   --  87.4 87.7 87.9 87.2  PLT 85*   < > 75*  --   --  84* 89* 109* 125*   < > = values in this interval not displayed.    Cardiac Enzymes: No results for input(s): CKTOTAL, CKMB, CKMBINDEX, TROPONINI in the last 168 hours.  BNP: Invalid input(s): POCBNP  CBG: Recent Labs  Lab 07/09/20 0752 07/09/20 1131 07/09/20 1653 07/09/20 2057 07/10/20 0745  GLUCAP 143* 220* 219* 155* 129*    Microbiology: Results for orders placed or performed during the hospital encounter of 06/17/20  Blood culture (routine x 2)     Status: Abnormal   Collection Time: 06/17/20  3:57 AM   Specimen: BLOOD  Result Value Ref Range Status   Specimen Description   Final    BLOOD LEFT HAND Performed at [facility], [address], [place], [place] [address]    Special Requests   Final    BOTTLES DRAWN AEROBIC AND ANAEROBIC Blood Culture adequate volume Performed at [facility], [address] [place], [place] [address]    Culture  Setup Time   Final    Organism ID to follow GRAM NEGATIVE RODS IN BOTH AEROBIC AND ANAEROBIC BOTTLES CRITICAL RESULT CALLED TO, READ BACK BY AND VERIFIED WITH: AMY THOMPSON AT 1534 ON 06/17/20 [place] Performed at [facility], [address] [place], [place] [address]    Culture KLEBSIELLA PNEUMONIAE (A)  Final   Report  Status 06/19/2020 FINAL  Final   Organism ID, Bacteria KLEBSIELLA PNEUMONIAE  Final      Susceptibility   Klebsiella pneumoniae - MIC*    AMPICILLIN RESISTANT Resistant     CEFAZOLIN <=4 SENSITIVE Sensitive     CEFEPIME <=0.12 SENSITIVE Sensitive     CEFTAZIDIME <=1 SENSITIVE Sensitive     CEFTRIAXONE <=0.25 SENSITIVE Sensitive     CIPROFLOXACIN <=0.25 SENSITIVE Sensitive     GENTAMICIN <=1 SENSITIVE Sensitive     IMIPENEM <=0.25 SENSITIVE Sensitive     TRIMETH/SULFA <=20 SENSITIVE Sensitive     AMPICILLIN/SULBACTAM 4 SENSITIVE Sensitive     PIP/TAZO <=4 SENSITIVE Sensitive     * KLEBSIELLA PNEUMONIAE  Blood culture (routine x 2)     Status: Abnormal   Collection Time: 06/17/20  3:57 AM   Specimen: BLOOD  Result Value Ref Range Status   Specimen Description   Final    BLOOD RIGHT [facility] Performed at [facility], [address], [place], [place] [address]    Special Requests   Final    BOTTLES DRAWN AEROBIC AND ANAEROBIC Blood Culture adequate volume Performed at [facility], [address] [place], [place] [address]    Culture  Setup Time   Final    GRAM NEGATIVE RODS IN BOTH AEROBIC AND ANAEROBIC BOTTLES CRITICAL VALUE NOTED.  VALUE IS CONSISTENT WITH PREVIOUSLY REPORTED AND CALLED VALUE. Performed at [facility], [address] [place], [place] [address]    Culture (A)  Final    KLEBSIELLA PNEUMONIAE SUSCEPTIBILITIES PERFORMED ON PREVIOUS CULTURE WITHIN THE LAST 5 DAYS. Performed at [facility], [place] [address], [place], [place] [address]    Report Status 06/19/2020 FINAL  Final  Blood Culture ID Panel (Reflexed)     Status: Abnormal   Collection Time: 06/17/20  3:57 AM  Result Value Ref Range Status   Enterococcus faecalis NOT DETECTED NOT DETECTED Final   Enterococcus Faecium NOT DETECTED NOT DETECTED Final   Listeria monocytogenes NOT DETECTED NOT DETECTED Final  Staphylococcus species NOT DETECTED NOT DETECTED Final    Staphylococcus aureus (BCID) NOT DETECTED NOT DETECTED Final   Staphylococcus epidermidis NOT DETECTED NOT DETECTED Final   Staphylococcus lugdunensis NOT DETECTED NOT DETECTED Final   Streptococcus species NOT DETECTED NOT DETECTED Final   Streptococcus agalactiae NOT DETECTED NOT DETECTED Final   Streptococcus pneumoniae NOT DETECTED NOT DETECTED Final   Streptococcus pyogenes NOT DETECTED NOT DETECTED Final   A.calcoaceticus-baumannii NOT DETECTED NOT DETECTED Final   Bacteroides fragilis NOT DETECTED NOT DETECTED Final   Enterobacterales DETECTED (A) NOT DETECTED Final    Comment: Enterobacterales represent a large order of gram negative bacteria, not a single organism. CRITICAL RESULT CALLED TO, READ BACK BY AND VERIFIED WITH: AMY THOMPSON AT 0160 ON 06/17/20 SNG    Enterobacter cloacae complex NOT DETECTED NOT DETECTED Final   Escherichia coli NOT DETECTED NOT DETECTED Final   Klebsiella aerogenes NOT DETECTED NOT DETECTED Final   Klebsiella oxytoca NOT DETECTED NOT DETECTED Final   Klebsiella pneumoniae DETECTED (A) NOT DETECTED Final    Comment: CRITICAL RESULT CALLED TO, READ BACK BY AND VERIFIED WITH: AMY THOMPSON RN AT 1093 ON 06/17/20 SNG    Proteus species NOT DETECTED NOT DETECTED Final   Salmonella species NOT DETECTED NOT DETECTED Final   Serratia marcescens NOT DETECTED NOT DETECTED Final   Haemophilus influenzae NOT DETECTED NOT DETECTED Final   Neisseria meningitidis NOT DETECTED NOT DETECTED Final   Pseudomonas aeruginosa NOT DETECTED NOT DETECTED Final   Stenotrophomonas maltophilia NOT DETECTED NOT DETECTED Final   Candida albicans NOT DETECTED NOT DETECTED Final   Candida auris NOT DETECTED NOT DETECTED Final   Candida glabrata NOT DETECTED NOT DETECTED Final   Candida krusei NOT DETECTED NOT DETECTED Final   Candida parapsilosis NOT DETECTED NOT DETECTED Final   Candida tropicalis NOT DETECTED NOT DETECTED Final   Cryptococcus neoformans/gattii NOT DETECTED  NOT DETECTED Final   CTX-M ESBL NOT DETECTED NOT DETECTED Final   Carbapenem resistance IMP NOT DETECTED NOT DETECTED Final   Carbapenem resistance KPC NOT DETECTED NOT DETECTED Final   Carbapenem resistance NDM NOT DETECTED NOT DETECTED Final   Carbapenem resist OXA 48 LIKE NOT DETECTED NOT DETECTED Final   Carbapenem resistance VIM NOT DETECTED NOT DETECTED Final    Comment: Performed at [facility], [address], [place], [place] [address]  Urine Culture     Status: Abnormal   Collection Time: 06/17/20 11:01 AM   Specimen: Urine, Random  Result Value Ref Range Status   Specimen Description   Final    URINE, RANDOM Performed at [facility], [address] [place], [place] [address]    Special Requests   Final    NONE Performed at [facility], [address] [place], [place] [address]    Culture >=100,000 COLONIES/mL KLEBSIELLA PNEUMONIAE (A)  Final   Report Status 06/19/2020 FINAL  Final   Organism ID, Bacteria KLEBSIELLA PNEUMONIAE (A)  Final      Susceptibility   Klebsiella pneumoniae - MIC*    AMPICILLIN RESISTANT Resistant     CEFAZOLIN <=4 SENSITIVE Sensitive     CEFTRIAXONE <=0.25 SENSITIVE Sensitive     CIPROFLOXACIN <=0.25 SENSITIVE Sensitive     GENTAMICIN <=1 SENSITIVE Sensitive     IMIPENEM <=0.25 SENSITIVE Sensitive     NITROFURANTOIN 32 SENSITIVE Sensitive     TRIMETH/SULFA <=20 SENSITIVE Sensitive     AMPICILLIN/SULBACTAM 4 SENSITIVE Sensitive     PIP/TAZO <=4 SENSITIVE Sensitive     * >=  100,000 COLONIES/mL KLEBSIELLA PNEUMONIAE  Culture, blood (Routine X 2) w Reflex to ID Panel     Status: None   Collection Time: 06/21/20 11:07 AM   Specimen: BLOOD  Result Value Ref Range Status   Specimen Description BLOOD RIGHT [facility]  Final   Special Requests   Final    BOTTLES DRAWN AEROBIC AND ANAEROBIC Blood Culture adequate volume   Culture   Final    NO GROWTH 5 DAYS Performed at [facility], [address]  [place], [place] [address]    Report Status 06/26/2020 FINAL  Final  Culture, blood (Routine X 2) w Reflex to ID Panel     Status: None   Collection Time: 06/21/20 11:09 AM   Specimen: BLOOD  Result Value Ref Range Status   Specimen Description BLOOD [place]  Final   Special Requests   Final    BOTTLES DRAWN AEROBIC AND ANAEROBIC Blood Culture adequate volume   Culture   Final    NO GROWTH 5 DAYS Performed at [facility], [address], [place], [place] [address]    Report Status 06/26/2020 FINAL  Final    Coagulation Studies: No results for input(s): LABPROT, INR in the last 72 hours.  Urinalysis: No results for input(s): COLORURINE, LABSPEC, PHURINE, GLUCOSEU, HGBUR, BILIRUBINUR, KETONESUR, PROTEINUR, UROBILINOGEN, NITRITE, LEUKOCYTESUR in the last 72 hours.  Invalid input(s): APPERANCEUR    Imaging: No results found.   Medications:   . sodium chloride 250 mL (07/10/20 0833)  . magnesium sulfate bolus IVPB 4 g (07/10/20 1020)   . amLODipine  5 mg Oral Daily  . vitamin C  500 mg Oral Daily  . brimonidine  1 drop Both Eyes BID  . chlorhexidine  15 mL Mouth Rinse BID  . Chlorhexidine Gluconate Cloth  6 each Topical Q0600  . diltiazem  120 mg Oral BID  . epoetin (EPOGEN/PROCRIT) injection  10,000 Units Subcutaneous Weekly  . hydrochlorothiazide  25 mg Oral Daily  . insulin aspart  0-15 Units Subcutaneous TID AC & HS  . [START ON 07/11/2020] magnesium oxide  400 mg Oral BID  . mouth rinse  15 mL Mouth Rinse BID  . metoprolol tartrate  50 mg Oral BID  . pantoprazole  40 mg Oral Daily  . potassium chloride  40 mEq Oral Q4H  . Ensure Max Protein  11 oz Oral Daily  . tamsulosin  0.4 mg Oral Daily  . zinc sulfate  220 mg Oral Daily   sodium chloride, acetaminophen, albuterol, guaiFENesin-dextromethorphan, heparin, labetalol, morphine injection, ondansetron (ZOFRAN) IV  Assessment/ Plan:  Ms. ELLAMAY FORS is a 79 y.o. white female with diabetes, COPD,  hypertension, glaucoma, history of colon cancer, GERD who was admitted to [facility] on 06/17/2020 for COVID pneumonia, urinary tract infection with klebsiella. Right ureteral stent was placed on 9/18. Patient underwent exlap with lysis of adhesions on 9/30.   1. Acute kidney injury: baseline creatinine 0.9 in 10/2019.  IV contrast on 9/17.  Patient did require hemodialysis.  - kidney function improving. No indication for further dialysis  2. Anemia with renal failure:   - EPO last given on 10/8 sub cu  3. Urinary tract infection with obstructive uropathy. Completed antibiotics.   4. Hypertension: elevated 154/71 Restarted on home medications: amlodipine, hydrochlorothiazide, metoprolol, tamsulosin and diltazem  5. Diabetes mellitus type II with renal manifestations:  Holding metformin and glipizide.   Will need outpatient follow up with nephrology.    LOS: 23 Secily Walthour 10/10/202111:02  AM

## 2020-07-10 NOTE — TOC Transition Note (Signed)
Transition of Care [facility]) - CM/SW Discharge Note   Patient Details  Name: Stephanie [name] MRN: [id number] Date of Birth: [birth year]  [facility] CM/SW Contact:  Truitt Merle, LCSW Phone Number: 07/10/2020, 2:58 PM   Clinical Narrative:    Patient medically ready for discharge to [facility] today. Confirmed discharge plan with patient and spouse-[name] Dail at bedside. Confirmed bed with Cleon Dew in admission at [facility]. Updated Dr. Arbutus Ped and RN Claiborne Billings. Patient going to room 606. Call report to [phone]. LCSW arranged transport with [facility]. Arrival ETA is 2:15PM. Med necessity form, DNR, and D/C summary in transportation packet on patient chart. No additional TOC needs at this time. LCSW signing off.   Final next level of care: Skilled Nursing Facility Barriers to Discharge: Barriers Resolved   Patient Goals and CMS Choice Patient states their goals for this hospitalization and ongoing recovery are:: to get better   Choice offered to / list presented to : Patient  Discharge Placement              Patient chooses bed at: [facility] [place] Patient to be transferred to facility by: [facility] Name of family member notified: Wilber Oliphant at bedside Patient and family notified of of transfer: 07/10/20  Discharge Plan and Services In-house Referral: NA   Post Acute Care Choice: NA                               Social Determinants of Health (SDOH) Interventions     Readmission Risk Interventions Readmission Risk Prevention Plan 06/21/2020 06/19/2020  Transportation Screening Complete Complete  PCP or Specialist Appt within 3-5 Days Complete -  HRI or Home Care Consult Complete -  Social Work Consult for [place] Planning/Counseling Complete -  Palliative Care Screening Not Applicable -  Medication Review [employer]) (No Data) Complete  PCP or Specialist  appointment within 3-5 days of discharge - Complete  HRI or [place] - Complete  SW Recovery Care/Counseling Consult - Complete  [place] - Not Applicable  Some recent data might be hidden

## 2020-07-10 NOTE — Discharge Summary (Signed)
Physician Discharge Summary  Stephanie [name] HKV:[id number] DOB: 11/17/1940 DOA: 06/17/2020  PCP: Rusty Aus, MD  Admit date: 06/17/2020 Discharge date: 07/10/2020  Admitted From: home Disposition:  SNF  Recommendations for Outpatient Follow-up:  1. Follow up with PCP in 1-2 weeks 2. Please obtain BMP/CBC in one week 3. Please follow up with urology 4. Please follow up with general surgery 5. Please follow up with nephrology  Home Health: No Equipment/Devices: None   Discharge Condition: Stable  CODE STATUS: DNR  Diet recommendation: Soft diet   Discharge Diagnoses: Principal Problem:   Sepsis ([place]) Active Problems:   Benign essential hypertension   Chronic cystitis   Rheumatoid arthritis involving multiple sites with positive rheumatoid factor (HCC)   Diabetes mellitus ([place])   Rheumatoid arthritis ([place])   COVID-19 virus infection   AKI (acute kidney injury) ([place])   Atrial fibrillation with rapid ventricular response ([place])   Essential hypertension   Pure hypercholesterolemia   Acute pyelonephritis   Hydronephrosis, right   Lactic acid acidosis   Hyperglycemia   Hx of essential hypertension   Immunosuppression due to drug therapy ([place])   Pressure injury of skin   Acquired thrombophilia (HCC)   On continuous oral anticoagulation   SBO (small bowel obstruction) (HCC)   Goals of care, counseling/discussion   Palliative care by specialist   CKD (chronic kidney disease) stage 5, GFR less than 15 ml/min (HCC)   Ileus (HCC)   Paroxysmal A-fib ([place])    Summary of HPI and Hospital Course:  Stephanie [name] is a 79 year old female with past medical history notable for type 2 diabetes mellitus, rheumatoid arthritis on chronic immunosuppressive therapy, essential hypertension, history of remote colon cancer and Covid-19 viral infection with positive PCR test June 09, 2020 who presented to the ED with with complaint of fever, progressive shortness of breath, and generalized  weakness.   10/6: Postop day 6.  Recovering well overall however had some acute anemia noted this morning.  Possibly due to bleeding with removal of femoral HD catheter.  Transfused 1 unit pRBC's with good response.  Hbg stable since.  10/8-9 now tolerating diet and having bowel movements.  Stable for d/c to rehab when bed is available.    Severe Sepsis, present on admission Klebsiella pneumonia septicemia, POA Right pyelonephritis Patient presented with fever, T-max 103.0, tachycardia, tachypnea with a WBC count of 20 K with a left shift and lactic acid of 3.1 reflects organ dysfunction. Underwent right ureter stent placement by urology with frank pus noted to kidney.  Blood cultures x2 positive for Klebsiella pneumonia.   Completed 10-day course of IV antibiotics with Zosyn >> Rocephin >> Cipro.  Repeat Blood cultures of 06/21/2020 negative. Patient has been afebrile, asymptomatic, stable for discharge to rehab.  Severe right hydronephrosis CT renal notable for progressive right UPJ obstruction in comparison to prior study dated October 2020.  Urology was consulted and right ureteral stent placed with findings of frank pus in the kidney by Dr. Jeffie Pollock on 06/18/2020.  Follow-up renal ultrasound 06/20/2020 with resolved hydronephrosis. --Continue Foley catheter per urology, Dr. Jeffie Pollock --Outpatient urology follow up  Acute renal failure Patient presenting above with severe right hydronephrosis with associated sepsis from Klebsiella pneumonia septicemia/pyelonephritis. Baseline creatinine 0.90 January 2021. Creatinine 1.57 on admission, trended up to a high of 6.33 during admission. Etiology likely secondary to ATN from severe infection as above. --Nephrology consulted. --Underwent placement of temporary HD catheter to right common femoral vein by vascular surgery on 06/27/2020.  --  Started on dialysis 9/28;right femoral PermCath removed 07/06/2020. No further plans for  hemodialysis. --Cr 6.33>>4.38>3.79>3.35>2.90>2.74>2.29>2.02>>1.94 today --Avoid nephrotoxins and hypotension (MAP>65), renally dose meds --Closely monitor urinary output --Follow up BMP  --Follow up with nephrology in clinic  Acute postoperative blood loss anemia Hemoglobin dropped from 8.2 to 7.1 postoperatively on 07/01/2020. Transfused 1 unit PRBC on 07/01/2020. Transfused additional unit PRBC on 07/06/2020 . Iron panel with iron 34, TIBC 195, ferritin 605, consistent with anemia of chronic disease. --Hgb 8.2>7.1>9.1>8.8>8.5>8.1>6.7>8.0>8.7>8.6>8.7 --Maintain hemoglobin greater than 8.0 given cardiac history --Repeat CBC in follow up   Thrombocytopenia Suspect due to sepsis.  Did have bleeding with removal of femoral HD cath.  Avoid heparin / enoxaparin, anticoagulation for now.   --CBC in 1 week  Hypokalemia Hypomagnesemia Replete as necessary --Goal maintainK 4.0 and Mg 2.0 given card hx and afib --Repeat labs in 1 week --on oral K and Mg supplements  Small bowel obstruction CT abdomen/pelvis with oral contrast notable for high-grade small bowel obstruction with abrupt transition point central lower abdomen, likely secondary to adhesions. Underwent exploratory laparotomy on 06/30/2020 by Dr. Dahlia Byes with extensive lysis of adhesions. --NG tube discontinued 10/3 --TPN discontinued on 07/07/2020 --Diet advanced tosoft diet by general surgery on 10/6 - patient tolerating --Monitor - ensure having flatulence and BM's --Follow up in clinic with general surgery --Tolerating > 50% of her meals, stable for discharge to SNF per surgery  Paroxysmal atrial fibrillation with RVR Cardiology was consulted. CHA2DS2-VASc score = 5 (HTN, age, T2DM, gender), not currently on anticoagulation due to bleeding.  Was on heparin infusion until episode of bleeding requiring transfusion and thrombocytopenia --On Metoprololtartrateand Cardizem CD --Follow up outpatient with cardiology  recs for A. fib, anticoagulation in setting of bleeding  GERD:Protonix   Pressure injury of buttock, stage I, POA --Continue local wound care, offloading --Reposition patient every 2 hrs   Hx COVID-19 viral infection Patient fully vaccinated, received Pfizer vaccine on 10/16/2019 and 11/07/2019. Covid-19 PCR positive on 06/09/2020. No overt respiratory symptoms. CT chest without acute pulmonary abnormality. Oxygenating well on room air. Completed 5-day course of IV remdesivir.  No longer on airborne/contact isolation since greater than 21 days since initial diagnosis.  History of rheumatoid arthritis Resumed on leflunomide and topical tacrolimus.  These were held during acute illnesses above.  Hx of Hypertension on metoprolol, diltiazem, HCTZ.  Started on amlodipine for better BP control.  Obesity: Body mass index is 33.52 kg/m.  Complicates overall care and prognosis    Discharge Instructions   Discharge Instructions    Call MD for:   Complete by: As directed    Not passing gas or having bowel movements   Call MD for:  extreme fatigue   Complete by: As directed    Call MD for:  persistant dizziness or light-headedness   Complete by: As directed    Call MD for:  persistant nausea and vomiting   Complete by: As directed    Call MD for:  redness, tenderness, or signs of infection (pain, swelling, redness, odor or green/yellow discharge around incision site)   Complete by: As directed    Call MD for:  severe uncontrolled pain   Complete by: As directed    Call MD for:  temperature >100.4   Complete by: As directed    Diet - low sodium heart healthy   Complete by: As directed    Increase activity slowly   Complete by: As directed    No dressing needed   Complete by:  As directed    Wash incision site with soap and water, pat dry     Allergies as of 07/10/2020      Reactions   Ibuprofen Itching   Indomethacin    Infliximab    Methotrexate Derivatives     Moexipril    Percocet [oxycodone-acetaminophen] Itching   Naprosyn [naproxen] Rash      Medication List    STOP taking these medications   metFORMIN 500 MG 24 hr tablet Commonly known as: GLUCOPHAGE-XR     TAKE these medications   acetaminophen 325 MG tablet Commonly known as: TYLENOL Take 2 tablets (650 mg total) by mouth every 6 (six) hours as needed for moderate pain, fever or headache.   amLODipine 10 MG tablet Commonly known as: NORVASC Take 5 mg by mouth 2 (two) times daily.   ascorbic acid 500 MG tablet Commonly known as: VITAMIN C Take 1 tablet (500 mg total) by mouth daily.   brimonidine 0.2 % ophthalmic solution Commonly known as: ALPHAGAN Place 1 drop into both eyes 2 (two) times daily.   Contour Test test strip Generic drug: glucose blood 1 each by Other route daily.   Cranberry 400 MG Caps Take by mouth.   diltiazem 120 MG 24 hr capsule Commonly known as: CARDIZEM CD Take 1 capsule (120 mg total) by mouth 2 (two) times daily.   Ensure Max Protein Liqd Take 330 mLs (11 oz total) by mouth daily.   estradiol 0.1 MG/GM vaginal cream Commonly known as: ESTRACE Place 1 Applicatorful vaginally at bedtime.   glipiZIDE 10 MG tablet Commonly known as: GLUCOTROL Take 10 mg by mouth daily before breakfast.   hydrochlorothiazide 25 MG tablet Commonly known as: HYDRODIURIL Take 25 mg by mouth daily.   insulin aspart 100 UNIT/ML injection Commonly known as: novoLOG Inject 0-15 Units into the skin 4 (four) times daily -  before meals and at bedtime.   leflunomide 10 MG tablet Commonly known as: ARAVA Take 10 mg by mouth daily.   magnesium oxide 400 (241.3 Mg) MG tablet Commonly known as: MAG-OX Take 1 tablet (400 mg total) by mouth 2 (two) times daily. Start taking on: July 11, 2020   metoprolol tartrate 50 MG tablet Commonly known as: LOPRESSOR Take 50 mg by mouth 2 (two) times daily.   omeprazole 40 MG capsule Commonly known as:  PRILOSEC Take 40 mg by mouth daily.   pantoprazole 40 MG tablet Commonly known as: PROTONIX Take 1 tablet (40 mg total) by mouth daily.   potassium chloride SA 20 MEQ tablet Commonly known as: KLOR-CON Take 20 mEq by mouth daily.   predniSONE 20 MG tablet Commonly known as: DELTASONE Take 2 tablets (40 mg total) by mouth daily with breakfast for 10 days.   PROBIOTIC-10 PO Take 1 Dose by mouth daily.   rosuvastatin 10 MG tablet Commonly known as: CRESTOR Take by mouth.   tacrolimus 0.1 % ointment Commonly known as: PROTOPIC Apply 1 application topically 2 (two) times daily as needed (rash).   tamsulosin 0.4 MG Caps capsule Commonly known as: FLOMAX TAKE 1 CAPSULE(0.4 MG) BY MOUTH DAILY What changed: See the new instructions.   vitamin B-12 1000 MCG tablet Commonly known as: CYANOCOBALAMIN Take 1,000 mcg by mouth daily.   zinc sulfate 220 (50 Zn) MG capsule Take 1 capsule (220 mg total) by mouth daily.            Discharge Care Instructions  (From admission, onward)  Start     Ordered   07/10/20 0000  No dressing needed       Comments: Wash incision site with soap and water, pat dry   07/10/20 [id number]          Contact information for follow-up providers    Tylene Fantasia, PA-C. Schedule an appointment as soon as possible for a visit in 1 week(s).   Specialty: Physician Assistant Why: s/p ex lap Contact information: [address] [place] [address] [phone]        Lavonia Dana, MD. Schedule an appointment as soon as possible for a visit in 2 week(s).   Specialty: Nephrology Contact information: [address] [place] [address] [phone]        Rusty Aus, MD. Schedule an appointment as soon as possible for a visit in 1 week(s).   Specialty: Internal Medicine Contact information: [place] [address] [phone]         Abbie Sons, MD. Schedule an appointment as soon as possible for a visit in 2 week(s).   Specialty: Urology Contact information: [place] [address] [phone]            Contact information for after-discharge care    Destination    [facility] Preferred [facility] .   Service: Skilled Nursing Contact information: [place] [phone]                 Allergies  Allergen Reactions  . Ibuprofen Itching  . Indomethacin   . Infliximab   . Methotrexate Derivatives   . Moexipril   . Percocet [Oxycodone-Acetaminophen] Itching  . Naprosyn [Naproxen] Rash    Consultations:  General Surgery  Nephrology  Cardiology  Urology   Procedures/Studies: CT ABDOMEN PELVIS WO CONTRAST  Addendum Date: 06/24/2020   ADDENDUM REPORT: 06/24/2020 14:16 ADDENDUM: Study discussed by telephone with Dr. Dahlia Byes on 06/24/2020 at 14:13. He advises that the patient is not an ideal surgical candidate at this time, and I agree that a trial of conservative treatment for the presumed adhesion related small-bowel obstruction seems reasonable. I reiterated that this does not appear to be a closed loop obstruction, and that no portal venous gas or other complicating features are identified. Electronically Signed   By: Genevie Ann M.D.   On: 06/24/2020 14:16   Result Date: 06/24/2020 CLINICAL DATA:  79 year old female with abnormal urinary tracks on recent noncontrast CT. Abnormal bowel-gas pattern on KUB yesterday suspicious for small bowel obstruction or ileus. EXAM: CT ABDOMEN AND PELVIS WITHOUT CONTRAST TECHNIQUE: Multidetector CT imaging of the abdomen and pelvis was performed following the standard protocol without IV contrast. COMPARISON:  Noncontrast CT Abdomen and Pelvis 06/17/2020. KUB yesterday. FINDINGS: Lower chest: Small layering left pleural effusion is new from the prior CT, along with trace right pleural  fluid. Mild lower lobe atelectasis on the left. No pericardial effusion. Calcified aortic atherosclerosis. Hepatobiliary: Small volume simple appearing perihepatic free fluid is new. The gallbladder is moderately distended with evidence of dependent sludge, but no convincing pericholecystic inflammation. Negative noncontrast liver. Pancreas: Negative. Spleen: Negative. Adrenals/Urinary Tract: Normal adrenal glands. Stable left kidney and ureter. New right double-J ureteral stent in place. Proximal and distal pigtails appear well position. Regressed right hydronephrosis and right perinephric space edema/inflammation. Decompressed urinary bladder now by Foley catheter, although there is residual bladder wall thickening. Stomach/Bowel: Rectosigmoid  anastomosis in the pelvis with no adverse features. Superimposed sigmoid descending and asked him oasis also with no adverse features. The large bowel is decompressed throughout. However, oral contrast has reached the ascending colon. Air and contrast distend the stomach, duodenum, and proximal small bowel loops, with the leading edge of small bowel contrast trailing off in mid jejunum and continued gas and fluid distended small bowel loops to the level of an above an abrupt transition in the central lower abdomen seen on series 2, image 69. Distal to that small bowel is decompressed and not opacified with contrast to the terminal ileum. There is a small volume of interloop fluid in the small bowel mesentery. No free air. Only mild small bowel wall thickening is identified. Vascular/Lymphatic: Extensive Aortoiliac calcified atherosclerosis. Vascular patency is not evaluated in the absence of IV contrast. No lymphadenopathy identified. Reproductive: Diminutive or absent uterus and ovaries as before. Other: Small volume of free fluid in the pelvis is new with simple fluid density (series 2, image 79). Bilateral flank and lower abdominal wall subcutaneous edema.  Musculoskeletal: Extensive previous posterior thoracolumbar spinal fusion which extends to the iliac wings appears stable. No acute osseous abnormality identified. IMPRESSION: 1. High-grade small-bowel obstruction with an abrupt transition point in the central lower abdomen on series 2, image 69. Favor adhesions. 2. Small volume of free fluid in the abdomen and pelvis, likely reactive and/or related to anasarca - with new left greater than right pleural effusions and bilateral body wall edema. 3. New right double-J ureteral stent with improved appearance of the right kidney. Urinary bladder decompressed by Foley catheter with residual bladder wall thickening. 4. Aortic Atherosclerosis (ICD10-I70.0). Electronically Signed: By: Genevie Ann M.D. On: 06/24/2020 13:36   X-ray chest PA or AP  Result Date: 06/27/2020 CLINICAL DATA:  Status post PICC line placement.  COVID. EXAM: CHEST  1 VIEW COMPARISON:  CT chest 06/17/2020.  Chest x-ray 06/17/2020. FINDINGS: NG tube noted with tip below left hemidiaphragm. Right IJ line noted with tip over right atrium. No PICC line noted. Heart size normal. Left base atelectasis/infiltrate small left pleural effusion. Mild right mid lung subsegmental atelectasis. Prominent skin fold on the left. No pneumothorax. Heart size normal. Prior thoracolumbar spine fusion. IMPRESSION: 1. NG tube noted with tip below left hemidiaphragm. Right IJ line noted with tip over the right atrium. No PICC line noted. 2. Left base atelectasis/infiltrate and small left pleural effusion. Mild right mid lung subsegmental atelectasis. Electronically Signed   By: Marcello Moores  Register   On: 06/27/2020 15:30   DG Abd 1 View  Result Date: 06/25/2020 CLINICAL DATA:  Bowel obstruction. EXAM: ABDOMEN - 1 VIEW COMPARISON:  06/24/2020 FINDINGS: An NG tube is again identified with tip overlying the peripyloric region. Decreased bowel distension noted. No new or increasing dilated bowel loops are present. RIGHT ureteral  stent and spinal surgical hardware again noted. IMPRESSION: Decreased bowel distension. Electronically Signed   By: Margarette Canada M.D.   On: 06/25/2020 07:42   DG Abd 1 View  Result Date: 06/24/2020 CLINICAL DATA:  NG tube placement EXAM: ABDOMEN - 1 VIEW COMPARISON:  06/23/2020 FINDINGS: Interval placement of nasogastric tube which courses below the diaphragm. Distal tip terminates within the right hemiabdomen, likely within the distal stomach. Dilated loops of small bowel are again seen within the visualized abdomen, similar in caliber to the previous study. Extensive thoracolumbar fusion hardware. IMPRESSION: 1. Interval placement of nasogastric tube with distal tip terminating within the distal stomach. 2. Persistent small  bowel obstruction. Electronically Signed   By: Davina Poke D.O.   On: 06/24/2020 14:33   DG Abd 1 View  Result Date: 06/23/2020 CLINICAL DATA:  Pyelonephritis, stent placement EXAM: ABDOMEN - 1 VIEW COMPARISON:  06/17/2020 FINDINGS: Three supine frontal views of the abdomen and pelvis are obtained. Right ureteral stent identified proximal aspect overlying right renal silhouette and distal aspect overlying the lower pelvis. There is diffuse gaseous distension of the bowel, which may reflect ileus. Maximal diameter of small bowel measures 5.4 cm. There are no masses or abnormal calcifications. Extensive postsurgical changes from thoracolumbar fusion. IMPRESSION: 1. Right ureteral stent as above. 2. Gaseous distention of bowel consistent with small-bowel obstruction or ileus. Electronically Signed   By: Randa Ngo M.D.   On: 06/23/2020 15:21   CT Angio Chest PE W and/or Wo Contrast  Result Date: 06/17/2020 CLINICAL DATA:  Positive D-dimer.  COVID. EXAM: CT ANGIOGRAPHY CHEST WITH CONTRAST TECHNIQUE: Multidetector CT imaging of the chest was performed using the standard protocol during bolus administration of intravenous contrast. Multiplanar CT image reconstructions and MIPs  were obtained to evaluate the vascular anatomy. CONTRAST:  5mL OMNIPAQUE IOHEXOL 350 MG/ML SOLN COMPARISON:  07/05/2019 FINDINGS: Cardiovascular: No filling defects in the pulmonary arteries to suggest pulmonary emboli. Heart is normal size. Aortic atherosclerosis. No aneurysm. Coronary artery calcifications. Mediastinum/Nodes: No mediastinal, hilar, or axillary adenopathy. Trachea and esophagus are unremarkable. Thyroid unremarkable. Lungs/Pleura: No confluent opacities or effusions. Upper Abdomen: Severe right hydronephrosis, slightly worsened from abdominal CT 07/05/2019. Musculoskeletal: Chest wall soft tissues are unremarkable. No acute bony abnormality. Review of the MIP images confirms the above findings. IMPRESSION: No acute bony abnormality. Postoperative changes in the thoracolumbar spine. No evidence of pulmonary embolus. Coronary artery disease. Severe right hydronephrosis, worsening since October 2020. Electronically Signed   By: Rolm Baptise M.D.   On: 06/17/2020 07:38   US RENAL  Result Date: 06/22/2020 CLINICAL DATA:  Inpatient.  Acute kidney injury.  Urinary debris. EXAM: RENAL / URINARY TRACT ULTRASOUND COMPLETE COMPARISON:  06/20/2020 renal sonogram. FINDINGS: Right Kidney: Renal measurements: 12.7 x 6.9 x 7.8 cm = volume: 361 mL. Echogenic right renal parenchyma, normal thickness. Proximal portion of right nephroureteral stent seen in the upper right renal collecting system. No right hydronephrosis. No right renal mass. Left Kidney: Renal measurements: 11.2 x 5.7 x 7.8 cm = volume: 177 mL. Echogenic left renal parenchyma, normal thickness. No left hydronephrosis. No left renal mass. Bladder: Bladder partially collapsed by indwelling Foley catheter. Other: Small amount of free fluid in Morison's pouch and in the left perinephric space. IMPRESSION: 1. No hydronephrosis. 2. Echogenic kidneys, compatible with nonspecific acute renal parenchymal disease. 3. Small amount of free fluid in Morison's  pouch and in the left perinephric space. Electronically Signed   By: Ilona Sorrel M.D.   On: 06/22/2020 10:55   US RENAL  Result Date: 06/20/2020 CLINICAL DATA:  Acute renal insufficiency. EXAM: RENAL / URINARY TRACT ULTRASOUND COMPLETE COMPARISON:  None. FINDINGS: Right Kidney: Renal measurements: 13.8 cm x 6.4 cm x 7.6 cm = volume: 350 mL. Diffusely increased echogenicity of the renal parenchyma is seen. No mass or hydronephrosis visualized. Left Kidney: Renal measurements: 12.0 cm x 5.5 cm x 5.4 cm = volume: 188 mL. Diffusely increased echogenicity of the renal parenchyma is seen. No mass or hydronephrosis visualized. Bladder: A Foley catheter is in place. Other: None. IMPRESSION: 1. Diffusely increased echogenicity of both kidneys, which may be secondary to medical renal disease. Electronically Signed  By: Virgina Norfolk M.D.   On: 06/20/2020 19:33   PERIPHERAL VASCULAR CATHETERIZATION  Result Date: 06/28/2020 See op note  DG Chest Portable 1 View  Result Date: 06/17/2020 CLINICAL DATA:  COVID with shortness of breath EXAM: PORTABLE CHEST 1 VIEW COMPARISON:  07/08/2019 FINDINGS: Normal heart size and mediastinal contours. No acute infiltrate or edema. No effusion or pneumothorax. No acute osseous findings. Artifact from EKG leads. Thoracolumbar posterior fusion hardware. IMPRESSION: Negative for pneumonia. Electronically Signed   By: Monte Fantasia M.D.   On: 06/17/2020 04:42   DG ABD ACUTE 2+V W 1V CHEST  Result Date: 06/26/2020 CLINICAL DATA:  Small bowel obstruction. EXAM: X-RAY ABDOMEN 2 VIEW FINDINGS: The enteric tube tip still terminates in the right upper quadrant, presumably at the distal stomach. Continued small bowel distension. No visible pneumoperitoneum internal ureteral stent on the right. Extensive thoracic to pelvic spine fusion. Atelectatic opacity at the lower lobes. IMPRESSION: Ongoing small bowel obstruction without detected change from yesterday. Electronically Signed    By: Monte Fantasia M.D.   On: 06/26/2020 10:41   DG Abd Portable 1V  Result Date: 06/29/2020 CLINICAL DATA:  Small bowel obstruction. EXAM: PORTABLE ABDOMEN - 1 VIEW COMPARISON:  June 27, 2020. FINDINGS: Nasogastric tube tip is seen in the distal stomach. Stable small bowel dilatation is noted concerning for distal small bowel obstruction. Postsurgical changes are seen involving the lumbar spine and sacrum. Right ureteral stent is noted. IMPRESSION: Stable small bowel dilatation is noted concerning for distal small bowel obstruction. Electronically Signed   By: Marijo Conception M.D.   On: 06/29/2020 08:53   DG Abd Portable 1V-Small Bowel Obstruction Protocol-initial, 8 hr delay  Result Date: 06/26/2020 CLINICAL DATA:  Small bowel obstruction.  8 hour delay EXAM: PORTABLE ABDOMEN - 1 VIEW COMPARISON:  06/26/2020 FINDINGS: Enteric tube is present with tip in the right upper quadrant, likely within the distal stomach. Gaseous distention of multiple small bowel loops consistent with small bowel obstruction. No radiopaque contrast material is demonstrated in the stomach or bowel. A right ureteral stent is present. Postoperative fixation of the lower thoracic and lumbosacral spine. IMPRESSION: Gaseous distention of multiple small bowel loops consistent with small bowel obstruction. No radiopaque contrast material is demonstrated in the stomach or bowel. Electronically Signed   By: Lucienne Capers M.D.   On: 06/26/2020 22:54   DG Abd Portable 2V  Result Date: 06/30/2020 CLINICAL DATA:  History of small-bowel obstruction EXAM: X-RAY ABDOMEN 2 VIEWS COMPARISON:  06/29/2020 FINDINGS: Scattered large and small bowel gas is noted. Persistent small bowel dilatation is seen and stable consistent with at least partial small bowel obstruction. Right ureteral stent in nasogastric catheter are noted and stable. Postsurgical changes in the thoracolumbar spine are noted. Right femoral dialysis catheter is noted.  IMPRESSION: Persistent small bowel obstruction.  No free air is seen. Electronically Signed   By: Inez Catalina M.D.   On: 06/30/2020 08:09   DG Abd Portable 2V  Result Date: 06/27/2020 CLINICAL DATA:  Ileus. Abdominal pain and distention. COVID positive. EXAM: X-RAY ABDOMEN 3 VIEWS COMPARISON:  06/26/2020. FINDINGS: NG tube noted in stable position. Right double-J ureteral stent stable position. Persistent distended loops of small bowel noted, improved from prior exam. Colonic gas pattern is unremarkable. No free air identified. Prior thoracolumbar and lumbosacral fusion. IMPRESSION: 1. NG tube and right double-J ureteral stent in stable position. 2. Persistent distended loops of small bowel, improved from prior exam. Colonic gas pattern is unremarkable. Electronically  Signed   By: Marcello Moores  Register   On: 06/27/2020 07:34   DG OR UROLOGY CYSTO IMAGE ([place])  Result Date: 06/18/2020 There is no interpretation for this exam.  This order is for images obtained during a surgical procedure.  Please See "Surgeries" Tab for more information regarding the procedure.   ECHOCARDIOGRAM COMPLETE  Result Date: 06/19/2020    ECHOCARDIOGRAM REPORT   Patient Name:   KYANDRA MCCLAINE Date of Exam: 06/19/2020 Medical Rec #:  [id number]     Height:       66.0 in Accession #:    [id number]    Weight:       177.2 lb Date of Birth:  11-20-1940      BSA:          1.900 m Patient Age:    79 years      BP:           125/80 mmHg Patient Gender: F             HR:           153 bpm. Exam Location:  [facility] Procedure: 2D Echo, Cardiac Doppler and Color Doppler Indications:     Atrial Fibrillation 427.31 / I48.91  History:         Patient has no prior history of Echocardiogram examinations.                  Risk Factors:Hypertension and Diabetes.  Sonographer:     Alyse Low Roar Referring Phys:  [place] Diagnosing Phys: Kate Sable MD IMPRESSIONS  1. Left ventricular ejection fraction, by estimation, is 50 to 55%. The left  ventricle has low normal function. The left ventricle has no regional wall motion abnormalities. There is mild left ventricular hypertrophy. Indeterminate diastolic filling due  to E-A fusion.  2. Right ventricular systolic function is normal. The right ventricular size is normal.  3. The mitral valve is normal in structure. No evidence of mitral valve regurgitation. No evidence of mitral stenosis.  4. The aortic valve is normal in structure. Aortic valve regurgitation is not visualized. No aortic stenosis is present. FINDINGS  Left Ventricle: Left ventricular ejection fraction, by estimation, is 50 to 55%. The left ventricle has low normal function. The left ventricle has no regional wall motion abnormalities. The left ventricular internal cavity size was normal in size. There is mild left ventricular hypertrophy. Indeterminate diastolic filling due to E-A fusion. Right Ventricle: The right ventricular size is normal. No increase in right ventricular wall thickness. Right ventricular systolic function is normal. Left Atrium: Left atrial size was normal in size. Right Atrium: Right atrial size was normal in size. Pericardium: There is no evidence of pericardial effusion. Mitral Valve: The mitral valve is normal in structure. No evidence of mitral valve regurgitation. No evidence of mitral valve stenosis. Tricuspid Valve: The tricuspid valve is grossly normal. Tricuspid valve regurgitation is not demonstrated. No evidence of tricuspid stenosis. Aortic Valve: The aortic valve is normal in structure. Aortic valve regurgitation is not visualized. No aortic stenosis is present. Aortic valve peak gradient measures 2.8 mmHg. Pulmonic Valve: The pulmonic valve was not well visualized. Pulmonic valve regurgitation is not visualized. No evidence of pulmonic stenosis. Aorta: The aortic root is normal in size and structure. Venous: The inferior vena cava was not well visualized. IAS/Shunts: No atrial level shunt detected by  color flow Doppler.  LEFT VENTRICLE PLAX 2D LVIDd:  4.34 cm  Diastology LVIDs:         3.24 cm  LV e' medial:    8.05 cm/s LV PW:         1.11 cm  LV E/e' medial:  12.1 LV IVS:        1.38 cm  LV e' lateral:   10.10 cm/s LVOT diam:     1.80 cm  LV E/e' lateral: 9.6 LVOT Area:     2.54 cm  RIGHT VENTRICLE RV Mid diam:    2.70 cm RV S prime:     19.00 cm/s TAPSE (M-mode): 1.6 cm LEFT ATRIUM           Index       RIGHT ATRIUM           Index LA diam:      3.40 cm 1.79 cm/m  RA Area:     12.60 cm LA Vol (A4C): 34.8 ml 18.32 ml/m RA Volume:   27.80 ml  14.63 ml/m  AORTIC VALVE               PULMONIC VALVE AV Area (Vmax): 2.66 cm   PV Vmax:        0.96 m/s AV Vmax:        84.20 cm/s PV Peak grad:   3.7 mmHg AV Peak Grad:   2.8 mmHg   RVOT Peak grad: 4 mmHg LVOT Vmax:      88.10 cm/s  AORTA Ao Root diam: 3.30 cm MITRAL VALVE               TRICUSPID VALVE MV Area (PHT): 3.60 cm    TR Peak grad:   17.0 mmHg MV Decel Time: 211 msec    TR Vmax:        206.00 cm/s MV E velocity: 97.30 cm/s                            SHUNTS                            Systemic Diam: 1.80 cm Kate Sable MD Electronically signed by Kate Sable MD Signature Date/Time: 06/19/2020/4:20:09 PM    Final    CT RENAL STONE STUDY  Result Date: 06/17/2020 CLINICAL DATA:  Hydronephrosis, acute kidney injury EXAM: CT ABDOMEN AND PELVIS WITHOUT CONTRAST TECHNIQUE: Multidetector CT imaging of the abdomen and pelvis was performed following the standard protocol without IV contrast. COMPARISON:  CTA of the chest obtained earlier today; prior CT scan of the abdomen and pelvis 07/05/2019 FINDINGS: Lower chest: No acute abnormality. Small hiatal hernia. Atherosclerotic calcifications visualized along the coronary arteries. The heart is normal in size. Hepatobiliary: Relatively low attenuation of the hepatic parenchyma consistent with steatosis. No discrete hepatic lesion. High attenuation material within the gallbladder may represent  sludge and/or small stones. Pancreas: Unremarkable. No pancreatic ductal dilatation or surrounding inflammatory changes. Spleen: Normal in size without focal abnormality. Adrenals/Urinary Tract: Normal adrenal glands. Nonspecific perinephric stranding bilaterally. Moderate right-sided hydronephrosis terminating at the ureteropelvic junction is slightly progressed compared to prior imaging from October of 2020. Filling defects are present within the dependent portion of the renal collecting system. The ureter itself appears patent to the bladder. The bladder is distended and contains a large amount of air. Additionally, there is some debris layering dependently. Striated nephrogram throughout the right kidney favored to represent delayed nephrogram secondary to  UPJ obstruction. Bilateral circumscribed low-attenuation renal lesions demonstrate no significant interval change in most likely represent simple and minimally complex renal cysts. Stomach/Bowel: No acute abnormality. Moderate rectal stool burden suggests constipation. Evidence of prior colonic and enteric surgery. No obstruction. Vascular/Lymphatic: Limited evaluation in the absence of intravenous contrast. Atherosclerotic vascular calcifications throughout the abdominal aorta and branch arteries. No suspicious lymphadenopathy. Reproductive: No acute abnormality. Other: No abdominal wall hernia or abnormality. No abdominopelvic ascites. Musculoskeletal: Advanced thoracolumbosacral posterior interbody fusion with multilevel screw and rod construct. No evidence of acute abnormality. IMPRESSION: 1. 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 2. Debris present dependently within the right renal collecting system and bladder. This may represent thrombus in the setting of hematuria, mucinous debris, or purulent debris if the patient has evidence of an active urinary tract infection. Ultimately, cystoscopy should be considered after  resolution of the patient's acute symptoms to exclude the less likely possibility of a bladder wall mass. 3. Bladder distension with air/gas floating in the fundus. This may be secondary to recent instrumentation (catheter insertion), or be reflective of infection with a gas-forming organism. Recommend correlation with urinalysis. 4. Nonspecific perinephric stranding bilaterally consistent with the history of acute kidney injury. 5. Moderate rectal stool burden consistent with constipation. 6. Small hiatal hernia. 7. Aortic and cardiac atherosclerotic vascular calcifications. Electronically Signed   By: Jacqulynn Cadet M.D.   On: 06/17/2020 11:04      Ex-lap, lysis of adhesions  Ureteral stent placement   Subjective: Pt seen after breakfast this AM, tolerating without abdominal pain, nausea or vomiting.  Is passing a lot of gas, small amounts of stool so far.  No fever/ chills or other complaints.   Discharge Exam: Vitals:   07/10/20 0402 07/10/20 0746  BP: (!) 157/73 (!) 154/71  Pulse: 77 80  Resp: 17 18  Temp: 99 F (37.2 C) 98.6 F (37 C)  SpO2: 94% 96%   Vitals:   07/10/20 0008 07/10/20 0402 07/10/20 0559 07/10/20 0746  BP: (!) 151/71 (!) 157/73  (!) 154/71  Pulse: 67 77  80  Resp: 16 17  18   Temp: 98.5 F (36.9 C) 99 F (37.2 C)  98.6 F (37 C)  TempSrc: Oral Oral  Oral  SpO2: 97% 94%  96%  Weight:   86 kg   Height:        General: Pt is alert, awake, not in acute distress Cardiovascular: RRR, S1/S2 +, no rubs, no gallops Respiratory: CTA bilaterally, no wheezing, no rhonchi Abdominal: midline surgical incision clean healing well staples intact, Soft, NT, bowel sounds + Extremities: pedal edema improving, RUE edema improving    The results of significant diagnostics from this hospitalization (including imaging, microbiology, ancillary and laboratory) are listed below for reference.     Microbiology: No results found for this or any previous visit (from the  past 240 hour(s)).   Labs: BNP (last 3 results) Recent Labs    06/18/20 0809  BNP 656.8*   Basic Metabolic Panel: Recent Labs  Lab 07/05/20 0449 07/05/20 0449 07/06/20 0533 07/07/20 0909 07/08/20 0523 07/09/20 0803 07/10/20 0556  NA 134*   < > 135 136 139 134* 138  K 4.0   < > 3.7 3.9 3.9 3.6 3.3*  CL 100   < > 102 102 105 101 101  CO2 25   < > 25 24 23 25 26   GLUCOSE 167*   < > 115* 213* 171* 144* 144*  BUN 99*   < >  98* 88* 78* 68* 60*  CREATININE 3.35*   < > 2.90* 2.74* 2.29* 2.02* 1.94*  CALCIUM 8.2*   < > 8.0* 7.8* 7.9* 8.0* 8.0*  MG 2.1  --  2.0  --  1.5* 1.7 1.5*  PHOS 2.0*  --  2.6  --  3.5 3.9 3.1   < > = values in this interval not displayed.   Liver Function Tests: Recent Labs  Lab 07/04/20 0607 07/08/20 0523 07/09/20 0803 07/10/20 0556  AST 12* 45* 19 37  ALT 9 53* 36 49*  ALKPHOS 111 509* 479* 617*  BILITOT 0.5 0.7 0.7 0.5  PROT 4.8* 5.3* 5.5* 5.4*  ALBUMIN 1.9* 2.0* 2.1* 2.1*   No results for input(s): LIPASE, AMYLASE in the last 168 hours. No results for input(s): AMMONIA in the last 168 hours. CBC: Recent Labs  Lab 07/04/20 0607 07/05/20 0449 07/06/20 0533 07/06/20 0533 07/06/20 1940 07/07/20 0909 07/08/20 0523 07/09/20 0803 07/10/20 0556  WBC 8.1   < > 7.9  --   --  9.4 9.7 8.5 9.4  NEUTROABS 6.7  --   --   --   --  7.7 7.9* 6.8 7.6  HGB 8.5*   < > 6.7*   < > 8.0* 8.7* 8.6* 8.7* 9.2*  HCT 25.4*   < > 21.1*   < > 24.4* 27.0* 26.3* 26.8* 27.9*  MCV 85.2   < > 88.7  --   --  87.4 87.7 87.9 87.2  PLT 85*   < > 75*  --   --  84* 89* 109* 125*   < > = values in this interval not displayed.   Cardiac Enzymes: No results for input(s): CKTOTAL, CKMB, CKMBINDEX, TROPONINI in the last 168 hours. BNP: Invalid input(s): POCBNP CBG: Recent Labs  Lab 07/09/20 0752 07/09/20 1131 07/09/20 1653 07/09/20 2057 07/10/20 0745  GLUCAP 143* 220* 219* 155* 129*   D-Dimer No results for input(s): DDIMER in the last 72 hours. Hgb A1c No  results for input(s): HGBA1C in the last 72 hours. Lipid Profile No results for input(s): CHOL, HDL, LDLCALC, TRIG, CHOLHDL, LDLDIRECT in the last 72 hours. Thyroid function studies No results for input(s): TSH, T4TOTAL, T3FREE, THYROIDAB in the last 72 hours.  Invalid input(s): FREET3 Anemia work up No results for input(s): VITAMINB12, FOLATE, FERRITIN, TIBC, IRON, RETICCTPCT in the last 72 hours. Urinalysis    Component Value Date/Time   COLORURINE YELLOW (A) 06/17/2020 1101   APPEARANCEUR TURBID (A) 06/17/2020 1101   APPEARANCEUR Cloudy (A) 05/13/2020 1110   LABSPEC 1.013 06/17/2020 1101   LABSPEC 1.013 01/04/2015 1239   PHURINE 5.0 06/17/2020 1101   GLUCOSEU >=500 (A) 06/17/2020 1101   GLUCOSEU Negative 01/04/2015 1239   HGBUR SMALL (A) 06/17/2020 1101   BILIRUBINUR NEGATIVE 06/17/2020 1101   BILIRUBINUR Negative 05/13/2020 1110   BILIRUBINUR Negative 01/04/2015 1239   KETONESUR NEGATIVE 06/17/2020 1101   PROTEINUR >=300 (A) 06/17/2020 1101   NITRITE NEGATIVE 06/17/2020 1101   LEUKOCYTESUR MODERATE (A) 06/17/2020 1101   LEUKOCYTESUR 2+ 01/04/2015 1239   Sepsis Labs Invalid input(s): PROCALCITONIN,  WBC,  LACTICIDVEN Microbiology No results found for this or any previous visit (from the past 240 hour(s)).   Time coordinating discharge: Over 30 minutes  SIGNED:   Ezekiel Slocumb, DO [facility] 07/10/2020, 10:02 AM   If 7PM-7AM, please contact night-coverage [email or web address]

## 2020-07-10 NOTE — Discharge Instructions (Signed)
Sliding scale insulin aspart / Novolog  CBG < 70: Implement Hypoglycemia Standing Orders and refer to Hypoglycemia Standing Orders sidebar report   CBG 70 - 120: 0 units   CBG 121 - 150: 2 units   CBG 151 - 200: 3 units   CBG 201 - 250: 5 units   CBG 251 - 300: 8 units   CBG 301 - 350: 11 units   CBG 351 - 400: 15 units   CBG > 400 call MD and obtain STAT lab verification

## 2020-07-11 ENCOUNTER — Telehealth: Payer: Self-pay | Admitting: Surgery

## 2020-07-11 NOTE — Telephone Encounter (Signed)
Per Dr. Dahlia Byes would like the patient to come in next week to see [facility] for a staple removal if patient calls back please schedule for patient.

## 2020-07-15 ENCOUNTER — Telehealth: Payer: Self-pay | Admitting: Urology

## 2020-07-15 NOTE — Telephone Encounter (Signed)
Created in error

## 2020-07-19 ENCOUNTER — Telehealth: Payer: Self-pay | Admitting: Urology

## 2020-07-20 ENCOUNTER — Ambulatory Visit: Payer: Medicare Other | Admitting: Urology

## 2020-07-20 DIAGNOSIS — N189 Chronic kidney disease, unspecified: Secondary | ICD-10-CM | POA: Insufficient documentation

## 2020-07-20 DIAGNOSIS — D631 Anemia in chronic kidney disease: Secondary | ICD-10-CM | POA: Insufficient documentation

## 2020-07-20 NOTE — Progress Notes (Signed)
07/21/2020 7:07 PM   Stephanie [name] 1941-06-08 [id number]  Referring provider: Rusty Aus, MD [facility] [place],   [address]  Chief Complaint  Patient presents with  . Recurrent UTI    HPI: Stephanie [name] is a 79 y.o. female with incomplete bladder emptying, rUTI's and left UPJ obstruction who presents today after hospitalization for sepsis.    Incomplete bladder emptying Was CIC x 2 daily.    rUTI's Date Organism  Antibiotic   07/05/2019 E. Coli - resistant ampicillin, ampicillin/subactam  Meropenem, Cipro x 10 days  07/16/2019 Staphylococcus epidermidis - resistant cipro, PCN, tetracycline Septra DS x 7 days  10/21/2019 Proteus mirabilis - resistant nitrofurantoin, tetracycline Septra DS x 7 days  11/16/2019 E. Coli - resistant ampicillin, cefazolin, tetracycline, trimethoprim/sulfa Asymptomatic - no antibiotic  12/03/2019 Klebsiella pneumoniae - resistant ampicillin Keflex 500 mg BID x 7 days  01/24/2020 Klebsiella pneumoniae - resistant ampicillin, nitrofurantoin Keflex 500 mg BID x 5 days  03/04/2020 E. Coli - resistant cipro, levaquin, tetracycline, trimethoprim/sulfa Nitrofurantoin 100 mg BID x 5 days  05/13/2020 Klebsiella pneumoniae - resistant ampicillin and E. Coli resistant to cipro, levaquin, tetracycline, trimethoprim/sulfa Augmentin 875/125, x 7 days  06/17/2020 Klebsiella pneumoniae - resistant ampicillin Rocephin x 5+ days   She underwent a right ureteral stent placement by Dr. Jeffie Pollock on 06/18/2020 for worsening of her right UPJ obstruction.  Her hydro improved, but her serum creatinine did not normalize initially as she was also found to have a small bowel obstruction and underwent exploratory lab with lysis of adhesions on 09/30.    She is currently residing at [facility] for rehabilitation.  She currently has a Foley catheter in place draining clear yellow urine.  She states she is weak, but she is not  having any flank pain.  Patient denies any modifying or aggravating factors.  Patient denies any gross hematuria, dysuria or suprapubic/flank pain.  Patient denies any fevers, chills, nausea or vomiting.   Her UA today nitrite negative, > 30 WBC's, 3-10 RBC's, calcium oxalate crystals and yeast.    PMH: Past Medical History:  Diagnosis Date  . Anemia   . Arthritis   . Asthma   . Cancer ([place]) 1996   colon  . Chronic kidney disease   . Complication of anesthesia    nausea  . Diabetes mellitus without complication ([place])   . GERD (gastroesophageal reflux disease)   . Glaucoma   . Hyperlipidemia   . Hypertension   . Lumbar disc disease   . Small bowel obstruction ([place])   . Vitamin B 12 deficiency     Surgical History: Past Surgical History:  Procedure Laterality Date  . ABDOMINAL HYSTERECTOMY    . BACK SURGERY    . COLON SURGERY    . COLONOSCOPY WITH PROPOFOL N/A 05/22/2017   Procedure: COLONOSCOPY WITH PROPOFOL;  Surgeon: Manya Silvas, MD;  [facility] ENDOSCOPY;  Service: Endoscopy;  Laterality: N/A;  . CYSTOSCOPY WITH STENT PLACEMENT Right 06/18/2020   Procedure: CYSTOSCOPY WITH STENT PLACEMENT;  Surgeon: Irine Seal, MD;  Location: [facility] ORS;  Service: Urology;  Laterality: Right;  . FRACTURE SURGERY    . KNEE ARTHROSCOPY    . LAPAROTOMY N/A 06/30/2020   Procedure: EXPLORATORY LAPAROTOMY;  Surgeon: Jules Husbands, MD;  Location: [facility] ORS;  Service: [name];  Laterality: N/A;  . LUMBAR LAMINECTOMY    . TEMPORARY DIALYSIS CATHETER N/A 06/27/2020   Procedure: TEMPORARY DIALYSIS CATHETER;  Surgeon: Katha Cabal, MD;  Location: [place] CV LAB;  Service: Cardiovascular;  Laterality: N/A;    Home Medications:  Allergies as of 07/21/2020      Reactions   Ibuprofen Itching   Indomethacin    Infliximab    Methotrexate Derivatives    Moexipril    Percocet [oxycodone-acetaminophen] Itching   Naprosyn [naproxen] Rash      Medication List       Accurate as  of July 21, 2020 11:59 PM. If you have any questions, ask your nurse or doctor.        acetaminophen 325 MG tablet Commonly known as: TYLENOL Take 2 tablets (650 mg total) by mouth every 6 (six) hours as needed for moderate pain, fever or headache.   amLODipine 10 MG tablet Commonly known as: NORVASC Take 5 mg by mouth 2 (two) times daily.   ascorbic acid 500 MG tablet Commonly known as: VITAMIN C Take 1 tablet (500 mg total) by mouth daily.   brimonidine 0.2 % ophthalmic solution Commonly known as: ALPHAGAN Place 1 drop into both eyes 2 (two) times daily.   Contour Test test strip Generic drug: glucose blood 1 each by Other route daily.   Cranberry 400 MG Caps Take 400 mg by mouth daily.   diltiazem 120 MG 24 hr capsule Commonly known as: CARDIZEM CD Take 1 capsule (120 mg total) by mouth 2 (two) times daily.   diltiazem 120 MG 24 hr capsule Commonly known as: TIAZAC Take by mouth.   Ensure Max Protein Liqd Take 330 mLs (11 oz total) by mouth daily.   estradiol 0.1 MG/GM vaginal cream Commonly known as: ESTRACE Place 1 Applicatorful vaginally at bedtime.   glipiZIDE 10 MG tablet Commonly known as: GLUCOTROL Take 10 mg by mouth daily before breakfast.   hydrochlorothiazide 25 MG tablet Commonly known as: HYDRODIURIL Take 25 mg by mouth daily.   insulin aspart 100 UNIT/ML injection Commonly known as: novoLOG Inject 0-15 Units into the skin 4 (four) times daily -  before meals and at bedtime.   insulin glargine 100 UNIT/ML injection Commonly known as: LANTUS Inject 10 Units into the skin daily at 6 PM.   leflunomide 10 MG tablet Commonly known as: ARAVA Take 10 mg by mouth daily.   magnesium oxide 400 (241.3 Mg) MG tablet Commonly known as: MAG-OX Take 1 tablet (400 mg total) by mouth 2 (two) times daily.   magnesium oxide 400 MG tablet Commonly known as: MAG-OX Take by mouth.   metoprolol tartrate 50 MG tablet Commonly known as: LOPRESSOR Take  50 mg by mouth 2 (two) times daily.   omeprazole 40 MG capsule Commonly known as: PRILOSEC Take 40 mg by mouth daily.   pantoprazole 40 MG tablet Commonly known as: PROTONIX Take 1 tablet (40 mg total) by mouth daily.   potassium chloride SA 20 MEQ tablet Commonly known as: KLOR-CON Take 20 mEq by mouth daily.   PROBIOTIC-10 PO Take 1 Dose by mouth daily.   rosuvastatin 10 MG tablet Commonly known as: CRESTOR Take 10 mg by mouth daily.   tacrolimus 0.1 % ointment Commonly known as: PROTOPIC Apply 1 application topically 2 (two) times daily as needed (rash).   tamsulosin 0.4 MG Caps capsule Commonly known as: FLOMAX TAKE 1 CAPSULE(0.4 MG) BY MOUTH DAILY What changed: See the new instructions.   vitamin B-12 1000 MCG tablet Commonly known as: CYANOCOBALAMIN Take 1,000 mcg by mouth daily.   zinc sulfate 220 (50 Zn) MG  capsule Take 1 capsule (220 mg total) by mouth daily.       Allergies:  Allergies  Allergen Reactions  . Ibuprofen Itching  . Indomethacin   . Infliximab   . Methotrexate Derivatives   . Moexipril   . Percocet [Oxycodone-Acetaminophen] Itching  . Naprosyn [Naproxen] Rash    Family History: Family History  Problem Relation Age of Onset  . Breast cancer Neg Hx     Social History:  reports that she has never smoked. She has never used smokeless tobacco. She reports that she does not drink alcohol and does not use drugs.  ROS: Pertinent ROS in HPI  Physical Exam: BP 128/77   Pulse 76   Ht 5\' 6"  (1.676 m)   Wt 170 lb (77.1 kg)   BMI 27.44 kg/m   Constitutional:  Well nourished. Alert and oriented, No acute distress. HEENT: [place] AT, mask in place.  Trachea midline Cardiovascular: No clubbing, cyanosis, or edema. Respiratory: Normal respiratory effort, no increased work of breathing. GU: No CVA tenderness.  No bladder fullness or masses.  Foley in place draining clear yellow urine.  Neurologic: Grossly intact, no focal deficits, moving all  4 extremities.  In wheelchair.  Psychiatric: Normal mood and affect.  Laboratory Data: Lab Results  Component Value Date   WBC 9.4 07/10/2020   HGB 9.2 (L) 07/10/2020   HCT 27.9 (L) 07/10/2020   MCV 87.2 07/10/2020   PLT 125 (L) 07/10/2020    Lab Results  Component Value Date   CREATININE 1.94 (H) 07/10/2020    Lab Results  Component Value Date   HGBA1C 8.0 (H) 06/25/2020    Lab Results  Component Value Date   TSH 0.958 06/18/2020    Lab Results  Component Value Date   AST 37 07/10/2020   Lab Results  Component Value Date   ALT 49 (H) 07/10/2020    Urinalysis Component     Latest Ref Rng & Units 07/21/2020  Specific Gravity, UA     1.005 - 1.030 1.015  pH, UA     5.0 - 7.5 5.5  Color, UA     Yellow Yellow  Appearance Ur     Clear Cloudy (A)  Leukocytes,UA     Negative 1+ (A)  Protein,UA     Negative/Trace 3+ (A)  Glucose, UA     Negative Negative  Ketones, UA     Negative Negative  RBC, UA     Negative 1+ (A)  Bilirubin, UA     Negative Negative  Urobilinogen, Ur     0.2 - 1.0 mg/dL 0.2  Nitrite, UA     Negative Negative  Microscopic Examination      See below:   Component     Latest Ref Rng & Units 07/21/2020          WBC, UA     0 - 5 /hpf >30 (A)  RBC     0 - 2 /hpf 3-10 (A)  Epithelial Cells (non renal)     0 - 10 /hpf 0-10  Renal Epithel, UA     None seen /hpf 0-10 (A)  Casts     None seen /lpf Present (A)  Cast Type     N/A Hyaline casts  Crystals     N/A Present (A)  Crystal Type     N/A Calcium Oxalate  Bacteria, UA     None seen/Few Few  Yeast, UA     None seen Present (A)  I have reviewed the labs.   Pertinent Imaging: ADDENDUM REPORT: 06/24/2020 14:16  ADDENDUM: Study discussed by telephone with Dr. Dahlia Byes on 06/24/2020 at 14:13. He advises that the patient is not an ideal surgical candidate at this time, and I agree that a trial of conservative treatment for the presumed adhesion related small-bowel  obstruction seems reasonable. I reiterated that this does not appear to be a closed loop obstruction, and that no portal venous gas or other complicating features are identified.   Electronically Signed   By: Genevie Ann M.D.   On: 06/24/2020 14:16   Addended by Gaspar Cola, MD on 06/24/2020 2:19 PM  Study Result  Addenda  ADDENDUM REPORT: 06/24/2020 14:16  ADDENDUM: Study discussed by telephone with Dr. Dahlia Byes on 06/24/2020 at 14:13. He advises that the patient is not an ideal surgical candidate at this time, and I agree that a trial of conservative treatment for the presumed adhesion related small-bowel obstruction seems reasonable. I reiterated that this does not appear to be a closed loop obstruction, and that no portal venous gas or other complicating features are identified.   Electronically Signed   By: Genevie Ann M.D.   On: 06/24/2020 14:16   Signed by Gaspar Cola, MD on 06/24/2020 2:19 PM  Narrative & Impression  CLINICAL DATA:  79 year old female with abnormal urinary tracks on recent noncontrast CT. Abnormal bowel-gas pattern on KUB yesterday suspicious for small bowel obstruction or ileus.  EXAM: CT ABDOMEN AND PELVIS WITHOUT CONTRAST  TECHNIQUE: Multidetector CT imaging of the abdomen and pelvis was performed following the standard protocol without IV contrast.  COMPARISON:  Noncontrast CT Abdomen and Pelvis 06/17/2020. KUB yesterday.  FINDINGS: Lower chest: Small layering left pleural effusion is new from the prior CT, along with trace right pleural fluid. Mild lower lobe atelectasis on the left. No pericardial effusion. Calcified aortic atherosclerosis.  Hepatobiliary: Small volume simple appearing perihepatic free fluid is new. The gallbladder is moderately distended with evidence of dependent sludge, but no convincing pericholecystic inflammation. Negative noncontrast liver.  Pancreas: Negative.  Spleen: Negative.  Adrenals/Urinary  Tract: Normal adrenal glands.  Stable left kidney and ureter.  New right double-J ureteral stent in place. Proximal and distal pigtails appear well position. Regressed right hydronephrosis and right perinephric space edema/inflammation.  Decompressed urinary bladder now by Foley catheter, although there is residual bladder wall thickening.  Stomach/Bowel: Rectosigmoid anastomosis in the pelvis with no adverse features. Superimposed sigmoid descending and asked him oasis also with no adverse features. The large bowel is decompressed throughout. However, oral contrast has reached the ascending colon.  Air and contrast distend the stomach, duodenum, and proximal small bowel loops, with the leading edge of small bowel contrast trailing off in mid jejunum and continued gas and fluid distended small bowel loops to the level of an above an abrupt transition in the central lower abdomen seen on series 2, image 69. Distal to that small bowel is decompressed and not opacified with contrast to the terminal ileum.  There is a small volume of interloop fluid in the small bowel mesentery. No free air. Only mild small bowel wall thickening is identified.  Vascular/Lymphatic: Extensive Aortoiliac calcified atherosclerosis. Vascular patency is not evaluated in the absence of IV contrast. No lymphadenopathy identified.  Reproductive: Diminutive or absent uterus and ovaries as before.  Other: Small volume of free fluid in the pelvis is new with simple fluid density (series 2, image 79). Bilateral flank and lower abdominal wall subcutaneous edema.  Musculoskeletal:  Extensive previous posterior thoracolumbar spinal fusion which extends to the iliac wings appears stable. No acute osseous abnormality identified.  IMPRESSION: 1. High-grade small-bowel obstruction with an abrupt transition point in the central lower abdomen on series 2, image 69. Favor adhesions.  2. Small volume of  free fluid in the abdomen and pelvis, likely reactive and/or related to anasarca - with new left greater than right pleural effusions and bilateral body wall edema.  3. New right double-J ureteral stent with improved appearance of the right kidney. Urinary bladder decompressed by Foley catheter with residual bladder wall thickening.  4. Aortic Atherosclerosis (ICD10-I70.0).  Electronically Signed: By: Genevie Ann M.D. On: 06/24/2020 13:36     Assessment & Plan:    1. Right hydronephrosis/UPJ obstruction - s/p right ureteral stent placement on 06/18/2020 - discussed with patient and her husband that we will need to either exchange the current stent with a stent that is indicated for long-term use or I could schedule an appointment with either Dr. Erlene Quan or Dr. Diamantina Providence to see if she is a candidate for a pyeloplasty.  They would both like to proceed with a long-term stent - will schedule her for placement for an Optimus stent placement and husband would like the indwelling Foley to continue as she is unable to self cath at this time - I explained to them have the procedure is performed and risks involved   Return for cystoscopy and stent exchange with an Optimus stent and replace Foley catheter .  These notes generated with voice recognition software. I apologize for typographical errors.  Zara Council, PA-C  [facility] [address]  [place], [place] [address] [phone]  I spent 30 minutes on the day of the encounter to include pre-visit record review, face-to-face time with the patient, and post-visit ordering of tests.

## 2020-07-20 NOTE — H&P (View-Only) (Signed)
07/21/2020 7:07 PM   Stephanie [name] 1941/02/18 [id number]  Referring provider: Rusty Aus, MD [facility] [place],  [place] [address]  Chief Complaint  Patient presents with  . Recurrent UTI    HPI: Stephanie [name] is a 79 y.o. female with incomplete bladder emptying, rUTI's and left UPJ obstruction who presents today after hospitalization for sepsis.    Incomplete bladder emptying Was CIC x 2 daily.    rUTI's Date Organism  Antibiotic   07/05/2019 E. Coli - resistant ampicillin, ampicillin/subactam  Meropenem, Cipro x 10 days  07/16/2019 Staphylococcus epidermidis - resistant cipro, PCN, tetracycline Septra DS x 7 days  10/21/2019 Proteus mirabilis - resistant nitrofurantoin, tetracycline Septra DS x 7 days  11/16/2019 E. Coli - resistant ampicillin, cefazolin, tetracycline, trimethoprim/sulfa Asymptomatic - no antibiotic  12/03/2019 Klebsiella pneumoniae - resistant ampicillin Keflex 500 mg BID x 7 days  01/24/2020 Klebsiella pneumoniae - resistant ampicillin, nitrofurantoin Keflex 500 mg BID x 5 days  03/04/2020 E. Coli - resistant cipro, levaquin, tetracycline, trimethoprim/sulfa Nitrofurantoin 100 mg BID x 5 days  05/13/2020 Klebsiella pneumoniae - resistant ampicillin and E. Coli resistant to cipro, levaquin, tetracycline, trimethoprim/sulfa Augmentin 875/125, x 7 days  06/17/2020 Klebsiella pneumoniae - resistant ampicillin Rocephin x 5+ days   She underwent a right ureteral stent placement by Dr. Jeffie Pollock on 06/18/2020 for worsening of her right UPJ obstruction.  Her hydro improved, but her serum creatinine did not normalize initially as she was also found to have a small bowel obstruction and underwent exploratory lab with lysis of adhesions on 09/30.    She is currently residing at [facility] for rehabilitation.  She currently has a Foley catheter in place draining clear yellow urine.  She states she is weak, but she is not  having any flank pain.  Patient denies any modifying or aggravating factors.  Patient denies any gross hematuria, dysuria or suprapubic/flank pain.  Patient denies any fevers, chills, nausea or vomiting.   Her UA today nitrite negative, > 30 WBC's, 3-10 RBC's, calcium oxalate crystals and yeast.    PMH: Past Medical History:  Diagnosis Date  . Anemia   . Arthritis   . Asthma   . Cancer ([place]) 1996   colon  . Chronic kidney disease   . Complication of anesthesia    nausea  . Diabetes mellitus without complication ([place])   . GERD (gastroesophageal reflux disease)   . Glaucoma   . Hyperlipidemia   . Hypertension   . Lumbar disc disease   . Small bowel obstruction ([place])   . Vitamin B 12 deficiency     Surgical History: Past Surgical History:  Procedure Laterality Date  . ABDOMINAL HYSTERECTOMY    . BACK SURGERY    . COLON SURGERY    . COLONOSCOPY WITH PROPOFOL N/A 05/22/2017   Procedure: COLONOSCOPY WITH PROPOFOL;  Surgeon: Manya Silvas, MD;  Location: [facility] ENDOSCOPY;  Service: Endoscopy;  Laterality: N/A;  . CYSTOSCOPY WITH STENT PLACEMENT Right 06/18/2020   Procedure: CYSTOSCOPY WITH STENT PLACEMENT;  Surgeon: Irine Seal, MD;  Location: [facility] ORS;  Service: Urology;  Laterality: Right;  . FRACTURE SURGERY    . KNEE ARTHROSCOPY    . LAPAROTOMY N/A 06/30/2020   Procedure: EXPLORATORY LAPAROTOMY;  Surgeon: Jules Husbands, MD;  Location: [facility] ORS;  Service: [name];  Laterality: N/A;  . LUMBAR LAMINECTOMY    . TEMPORARY DIALYSIS CATHETER N/A 06/27/2020   Procedure: TEMPORARY DIALYSIS CATHETER;  Surgeon: Katha Cabal, MD;  Location: [facility] CV LAB;  Service: Cardiovascular;  Laterality: N/A;    Home Medications:  Allergies as of 07/21/2020      Reactions   Ibuprofen Itching   Indomethacin    Infliximab    Methotrexate Derivatives    Moexipril    Percocet [oxycodone-acetaminophen] Itching   Naprosyn [naproxen] Rash      Medication List       Accurate as  of July 21, 2020 11:59 PM. If you have any questions, ask your nurse or doctor.        acetaminophen 325 MG tablet Commonly known as: TYLENOL Take 2 tablets (650 mg total) by mouth every 6 (six) hours as needed for moderate pain, fever or headache.   amLODipine 10 MG tablet Commonly known as: NORVASC Take 5 mg by mouth 2 (two) times daily.   ascorbic acid 500 MG tablet Commonly known as: VITAMIN C Take 1 tablet (500 mg total) by mouth daily.   brimonidine 0.2 % ophthalmic solution Commonly known as: ALPHAGAN Place 1 drop into both eyes 2 (two) times daily.   Contour Test test strip Generic drug: glucose blood 1 each by Other route daily.   Cranberry 400 MG Caps Take 400 mg by mouth daily.   diltiazem 120 MG 24 hr capsule Commonly known as: CARDIZEM CD Take 1 capsule (120 mg total) by mouth 2 (two) times daily.   diltiazem 120 MG 24 hr capsule Commonly known as: TIAZAC Take by mouth.   Ensure Max Protein Liqd Take 330 mLs (11 oz total) by mouth daily.   estradiol 0.1 MG/GM vaginal cream Commonly known as: ESTRACE Place 1 Applicatorful vaginally at bedtime.   glipiZIDE 10 MG tablet Commonly known as: GLUCOTROL Take 10 mg by mouth daily before breakfast.   hydrochlorothiazide 25 MG tablet Commonly known as: HYDRODIURIL Take 25 mg by mouth daily.   insulin aspart 100 UNIT/ML injection Commonly known as: novoLOG Inject 0-15 Units into the skin 4 (four) times daily -  before meals and at bedtime.   insulin glargine 100 UNIT/ML injection Commonly known as: LANTUS Inject 10 Units into the skin daily at 6 PM.   leflunomide 10 MG tablet Commonly known as: ARAVA Take 10 mg by mouth daily.   magnesium oxide 400 (241.3 Mg) MG tablet Commonly known as: MAG-OX Take 1 tablet (400 mg total) by mouth 2 (two) times daily.   magnesium oxide 400 MG tablet Commonly known as: MAG-OX Take by mouth.   metoprolol tartrate 50 MG tablet Commonly known as: LOPRESSOR Take  50 mg by mouth 2 (two) times daily.   omeprazole 40 MG capsule Commonly known as: PRILOSEC Take 40 mg by mouth daily.   pantoprazole 40 MG tablet Commonly known as: PROTONIX Take 1 tablet (40 mg total) by mouth daily.   potassium chloride SA 20 MEQ tablet Commonly known as: KLOR-CON Take 20 mEq by mouth daily.   PROBIOTIC-10 PO Take 1 Dose by mouth daily.   rosuvastatin 10 MG tablet Commonly known as: CRESTOR Take 10 mg by mouth daily.   tacrolimus 0.1 % ointment Commonly known as: PROTOPIC Apply 1 application topically 2 (two) times daily as needed (rash).   tamsulosin 0.4 MG Caps capsule Commonly known as: FLOMAX TAKE 1 CAPSULE(0.4 MG) BY MOUTH DAILY What changed: See the new instructions.   vitamin B-12 1000 MCG tablet Commonly known as: CYANOCOBALAMIN Take 1,000 mcg by mouth daily.   zinc sulfate 220 (50 Zn) MG  capsule Take 1 capsule (220 mg total) by mouth daily.       Allergies:  Allergies  Allergen Reactions  . Ibuprofen Itching  . Indomethacin   . Infliximab   . Methotrexate Derivatives   . Moexipril   . Percocet [Oxycodone-Acetaminophen] Itching  . Naprosyn [Naproxen] Rash    Family History: Family History  Problem Relation Age of Onset  . Breast cancer Neg Hx     Social History:  reports that she has never smoked. She has never used smokeless tobacco. She reports that she does not drink alcohol and does not use drugs.  ROS: Pertinent ROS in HPI  Physical Exam: BP 128/77   Pulse 76   Ht 5\' 6"  (1.676 m)   Wt 170 lb (77.1 kg)   BMI 27.44 kg/m   Constitutional:  Well nourished. Alert and oriented, No acute distress. HEENT: [place] AT, mask in place.  Trachea midline Cardiovascular: No clubbing, cyanosis, or edema. Respiratory: Normal respiratory effort, no increased work of breathing. GU: No CVA tenderness.  No bladder fullness or masses.  Foley in place draining clear yellow urine.  Neurologic: Grossly intact, no focal deficits, moving all  4 extremities.  In wheelchair.  Psychiatric: Normal mood and affect.  Laboratory Data: Lab Results  Component Value Date   WBC 9.4 07/10/2020   HGB 9.2 (L) 07/10/2020   HCT 27.9 (L) 07/10/2020   MCV 87.2 07/10/2020   PLT 125 (L) 07/10/2020    Lab Results  Component Value Date   CREATININE 1.94 (H) 07/10/2020    Lab Results  Component Value Date   HGBA1C 8.0 (H) 06/25/2020    Lab Results  Component Value Date   TSH 0.958 06/18/2020    Lab Results  Component Value Date   AST 37 07/10/2020   Lab Results  Component Value Date   ALT 49 (H) 07/10/2020    Urinalysis Component     Latest Ref Rng & Units 07/21/2020  Specific Gravity, UA     1.005 - 1.030 1.015  pH, UA     5.0 - 7.5 5.5  Color, UA     Yellow Yellow  Appearance Ur     Clear Cloudy (A)  Leukocytes,UA     Negative 1+ (A)  Protein,UA     Negative/Trace 3+ (A)  Glucose, UA     Negative Negative  Ketones, UA     Negative Negative  RBC, UA     Negative 1+ (A)  Bilirubin, UA     Negative Negative  Urobilinogen, Ur     0.2 - 1.0 mg/dL 0.2  Nitrite, UA     Negative Negative  Microscopic Examination      See below:   Component     Latest Ref Rng & Units 07/21/2020          WBC, UA     0 - 5 /hpf >30 (A)  RBC     0 - 2 /hpf 3-10 (A)  Epithelial Cells (non renal)     0 - 10 /hpf 0-10  Renal Epithel, UA     None seen /hpf 0-10 (A)  Casts     None seen /lpf Present (A)  Cast Type     N/A Hyaline casts  Crystals     N/A Present (A)  Crystal Type     N/A Calcium Oxalate  Bacteria, UA     None seen/Few Few  Yeast, UA     None seen Present (A)  I have reviewed the labs.   Pertinent Imaging: ADDENDUM REPORT: 06/24/2020 14:16  ADDENDUM: Study discussed by telephone with Dr. Dahlia Byes on 06/24/2020 at 14:13. He advises that the patient is not an ideal surgical candidate at this time, and I agree that a trial of conservative treatment for the presumed adhesion related small-bowel  obstruction seems reasonable. I reiterated that this does not appear to be a closed loop obstruction, and that no portal venous gas or other complicating features are identified.   Electronically Signed   By: Genevie Ann M.D.   On: 06/24/2020 14:16   Addended by Gaspar Cola, MD on 06/24/2020 2:19 PM  Study Result  Addenda  ADDENDUM REPORT: 06/24/2020 14:16  ADDENDUM: Study discussed by telephone with Dr. Dahlia Byes on 06/24/2020 at 14:13. He advises that the patient is not an ideal surgical candidate at this time, and I agree that a trial of conservative treatment for the presumed adhesion related small-bowel obstruction seems reasonable. I reiterated that this does not appear to be a closed loop obstruction, and that no portal venous gas or other complicating features are identified.   Electronically Signed   By: Genevie Ann M.D.   On: 06/24/2020 14:16   Signed by Gaspar Cola, MD on 06/24/2020 2:19 PM  Narrative & Impression  CLINICAL DATA:  79 year old female with abnormal urinary tracks on recent noncontrast CT. Abnormal bowel-gas pattern on KUB yesterday suspicious for small bowel obstruction or ileus.  EXAM: CT ABDOMEN AND PELVIS WITHOUT CONTRAST  TECHNIQUE: Multidetector CT imaging of the abdomen and pelvis was performed following the standard protocol without IV contrast.  COMPARISON:  Noncontrast CT Abdomen and Pelvis 06/17/2020. KUB yesterday.  FINDINGS: Lower chest: Small layering left pleural effusion is new from the prior CT, along with trace right pleural fluid. Mild lower lobe atelectasis on the left. No pericardial effusion. Calcified aortic atherosclerosis.  Hepatobiliary: Small volume simple appearing perihepatic free fluid is new. The gallbladder is moderately distended with evidence of dependent sludge, but no convincing pericholecystic inflammation. Negative noncontrast liver.  Pancreas: Negative.  Spleen: Negative.  Adrenals/Urinary  Tract: Normal adrenal glands.  Stable left kidney and ureter.  New right double-J ureteral stent in place. Proximal and distal pigtails appear well position. Regressed right hydronephrosis and right perinephric space edema/inflammation.  Decompressed urinary bladder now by Foley catheter, although there is residual bladder wall thickening.  Stomach/Bowel: Rectosigmoid anastomosis in the pelvis with no adverse features. Superimposed sigmoid descending and asked him oasis also with no adverse features. The large bowel is decompressed throughout. However, oral contrast has reached the ascending colon.  Air and contrast distend the stomach, duodenum, and proximal small bowel loops, with the leading edge of small bowel contrast trailing off in mid jejunum and continued gas and fluid distended small bowel loops to the level of an above an abrupt transition in the central lower abdomen seen on series 2, image 69. Distal to that small bowel is decompressed and not opacified with contrast to the terminal ileum.  There is a small volume of interloop fluid in the small bowel mesentery. No free air. Only mild small bowel wall thickening is identified.  Vascular/Lymphatic: Extensive Aortoiliac calcified atherosclerosis. Vascular patency is not evaluated in the absence of IV contrast. No lymphadenopathy identified.  Reproductive: Diminutive or absent uterus and ovaries as before.  Other: Small volume of free fluid in the pelvis is new with simple fluid density (series 2, image 79). Bilateral flank and lower abdominal wall subcutaneous edema.  Musculoskeletal:  Extensive previous posterior thoracolumbar spinal fusion which extends to the iliac wings appears stable. No acute osseous abnormality identified.  IMPRESSION: 1. High-grade small-bowel obstruction with an abrupt transition point in the central lower abdomen on series 2, image 69. Favor adhesions.  2. Small volume of  free fluid in the abdomen and pelvis, likely reactive and/or related to anasarca - with new left greater than right pleural effusions and bilateral body wall edema.  3. New right double-J ureteral stent with improved appearance of the right kidney. Urinary bladder decompressed by Foley catheter with residual bladder wall thickening.  4. Aortic Atherosclerosis (ICD10-I70.0).  Electronically Signed: By: Genevie Ann M.D. On: 06/24/2020 13:36     Assessment & Plan:    1. Right hydronephrosis/UPJ obstruction - s/p right ureteral stent placement on 06/18/2020 - discussed with patient and her husband that we will need to either exchange the current stent with a stent that is indicated for long-term use or I could schedule an appointment with either Dr. Erlene Quan or Dr. Diamantina Providence to see if she is a candidate for a pyeloplasty.  They would both like to proceed with a long-term stent - will schedule her for placement for an Optimus stent placement and husband would like the indwelling Foley to continue as she is unable to self cath at this time - I explained to them have the procedure is performed and risks involved   Return for cystoscopy and stent exchange with an Optimus stent and replace Foley catheter .  These notes generated with voice recognition software. I apologize for typographical errors.  Zara Council, PA-C  [facility] [address]  [place], [place] [address] [phone]  I spent 30 minutes on the day of the encounter to include pre-visit record review, face-to-face time with the patient, and post-visit ordering of tests.

## 2020-07-21 ENCOUNTER — Other Ambulatory Visit: Payer: Self-pay

## 2020-07-21 ENCOUNTER — Ambulatory Visit (INDEPENDENT_AMBULATORY_CARE_PROVIDER_SITE_OTHER): Payer: Medicare Other | Admitting: Urology

## 2020-07-21 VITALS — BP 128/77 | HR 76 | Ht 66.0 in | Wt 170.0 lb

## 2020-07-21 DIAGNOSIS — N390 Urinary tract infection, site not specified: Secondary | ICD-10-CM | POA: Diagnosis not present

## 2020-07-21 DIAGNOSIS — N135 Crossing vessel and stricture of ureter without hydronephrosis: Secondary | ICD-10-CM

## 2020-07-22 ENCOUNTER — Other Ambulatory Visit: Payer: Self-pay | Admitting: Radiology

## 2020-07-22 DIAGNOSIS — N135 Crossing vessel and stricture of ureter without hydronephrosis: Secondary | ICD-10-CM

## 2020-07-22 LAB — URINALYSIS, COMPLETE
Bilirubin, UA: NEGATIVE
Glucose, UA: NEGATIVE
Ketones, UA: NEGATIVE
Nitrite, UA: NEGATIVE
Specific Gravity, UA: 1.015 (ref 1.005–1.030)
Urobilinogen, Ur: 0.2 mg/dL (ref 0.2–1.0)
pH, UA: 5.5 (ref 5.0–7.5)

## 2020-07-22 LAB — MICROSCOPIC EXAMINATION: WBC, UA: >30 /hpf — AB (ref 0–5)

## 2020-07-25 ENCOUNTER — Encounter: Payer: Self-pay | Admitting: Physician Assistant

## 2020-07-25 ENCOUNTER — Other Ambulatory Visit: Payer: Self-pay

## 2020-07-25 ENCOUNTER — Ambulatory Visit (INDEPENDENT_AMBULATORY_CARE_PROVIDER_SITE_OTHER): Payer: Medicare Other | Admitting: Physician Assistant

## 2020-07-25 DIAGNOSIS — Z09 Encounter for follow-up examination after completed treatment for conditions other than malignant neoplasm: Secondary | ICD-10-CM

## 2020-07-25 DIAGNOSIS — K56609 Unspecified intestinal obstruction, unspecified as to partial versus complete obstruction: Secondary | ICD-10-CM

## 2020-07-25 NOTE — Progress Notes (Signed)
[facility] POST-OP OFFICE VISIT  07/25/2020  HPI: Stephanie [name] is a 79 y.o. female 25 days s/p exploratory laparotomy and lysis of adhesion for SBO with Dr Dahlia Byes  She is overall progressing well She continues to work with physical therapy at SNF, regaining strength No abdominal pain, nausea, emesis She tolerating PO Having bowel function She still have staples in midline wound   Physical Exam: Constitutional: Well appearing female, NAD Abdomen: Soft, non-tender, non-distended, no rebound/guarding Skin: Laparotomy incision is CDI, still with staples, well healed, no erythema or drainage   Assessment/Plan: This is a 79 y.o. female 25 days s/p exploratory laparotomy and lysis of adhesion for SBO   - Staples removed, wound 100% healed  - Reviewed lifting restrictions  - Reviewed wound care  - Continue to work with therapies at SNF  - rtc prn; advised to call with questions/concerns   -- Edison Simon, PA-C [facility] 07/25/2020, 11:14 AM [phone] M-F: 7am - 4pm

## 2020-07-25 NOTE — Patient Instructions (Signed)
You have no dietary restrictions. Continue to work with therapy to get better and increase your strength.   Follow-up with our office as needed.  Please call and ask to speak with a nurse if you develop questions or concerns.

## 2020-07-26 LAB — CULTURE, URINE COMPREHENSIVE

## 2020-07-27 ENCOUNTER — Other Ambulatory Visit
Admission: RE | Admit: 2020-07-27 | Discharge: 2020-07-27 | Disposition: A | Discharge status: Home / Self Care | Payer: Medicare Other | Source: Ambulatory Visit | Attending: Urology | Admitting: Urology

## 2020-07-27 ENCOUNTER — Telehealth: Payer: Self-pay

## 2020-07-27 ENCOUNTER — Other Ambulatory Visit: Payer: Self-pay

## 2020-07-27 HISTORY — DX: Anemia, unspecified: D64.9

## 2020-07-27 HISTORY — DX: Other complications of anesthesia, initial encounter: T88.59XA

## 2020-07-27 HISTORY — DX: Chronic kidney disease, unspecified: N18.9

## 2020-07-27 MED ORDER — FLUCONAZOLE 100 MG PO TABS: 100.0000 mg | ORAL_TABLET | Freq: Every day | ORAL | 0 refills | Status: DC

## 2020-07-27 NOTE — Patient Instructions (Signed)
Your procedure is scheduled on: 08/02/2020- Tuesday Report to Day Surgery on the 2nd floor of the [facility]. To find out your arrival time, please call [phone] between 1PM - 3PM on: 08/01/2020- Monday  REMEMBER: Instructions that are not followed completely may result in serious medical risk, up to and including death; or upon the discretion of your surgeon and anesthesiologist your surgery may need to be rescheduled.  Do not eat food after midnight the night before surgery.  No gum chewing, lozengers or hard candies.  You may however, drink CLEAR liquids up to 2 hours before you are scheduled to arrive for your surgery. Do not drink anything within 2 hours of your scheduled arrival time.  Type 1 and Type 2 diabetics should only drink water.  TAKE THESE MEDICATIONS THE MORNING OF SURGERY WITH A SIP OF WATER:  - amLODipine (NORVASC) 10 MG tablet - brimonidine (ALPHAGAN) 0.2 % ophthalmic solution - diltiazem (CARDIZEM CD) 120 MG 24 hr capsule - leflunomide (ARAVA) 10 MG tablet - metoprolol tartrate (LOPRESSOR) 50 MG tablet  - predniSONE (DELTASONE) 20 MG tablet - tamsulosin (FLOMAX) 0.4 MG CAPS capsule  Use inhalers on the day of surgery and bring to the hospital.  Take 1/2 of usual insulin glargine (LANTUS) 100 UNIT/ML injection and insulin aspart (NOVOLOG) 100 UNIT/ML injection    dose the night before surgery and none on the morning of surgery.  One week prior to surgery: Stop Anti-inflammatories (NSAIDS) such as Advil, Aleve, Ibuprofen, Motrin, Naproxen, Naprosyn and Aspirin based products such as Excedrin, Goodys Powder, BC Powder.  Stop ANY OVER THE COUNTER supplements until after surgery. (You may continue taking Tylenol, Vitamin D, Vitamin B, and multivitamin.)  No Alcohol for 24 hours before or after surgery.  No Smoking including e-cigarettes for 24 hours prior to surgery.  No chewable tobacco products for at least 6 hours prior to surgery.  No nicotine  patches on the day of surgery.  Do not use any "recreational" drugs for at least a week prior to your surgery.  Please be advised that the combination of cocaine and anesthesia may have negative outcomes, up to and including death. If you test positive for cocaine, your surgery will be cancelled.  On the morning of surgery brush your teeth with toothpaste and water, you may rinse your mouth with mouthwash if you wish. Do not swallow any toothpaste or mouthwash.  Do not wear jewelry, make-up, hairpins, clips or nail polish.  Do not wear lotions, powders, or perfumes.   Do not shave 48 hours prior to surgery.   Contact lenses, hearing aids and dentures may not be worn into surgery.  Do not bring valuables to the hospital. [facility] is not responsible for any missing/lost belongings or valuables.   Notify your doctor if there is any change in your medical condition (cold, fever, infection).  Wear comfortable clothing (specific to your surgery type) to the hospital.  Plan for stool softeners for home use; pain medications have a tendency to cause constipation. You can also help prevent constipation by eating foods high in fiber such as fruits and vegetables and drinking plenty of fluids as your diet allows.  After surgery, you can help prevent lung complications by doing breathing exercises.  Take deep breaths and cough every 1-2 hours. Your doctor may order a device called an Incentive Spirometer to help you take deep breaths. When coughing or sneezing, hold a pillow firmly against your incision with both hands. This is called "  splinting." Doing this helps protect your incision. It also decreases belly discomfort.  If you are being admitted to the hospital overnight, leave your suitcase in the car. After surgery it may be brought to your room.  If you are being discharged the day of surgery, you will not be allowed to drive home. You will need a responsible adult (18 years or older)  to drive you home and stay with you that night.   If you are taking public transportation, you will need to have a responsible adult (18 years or older) with you. Please confirm with your physician that it is acceptable to use public transportation.   Please call the [facility] at [phone] if you have any questions about these instructions.  Visitation Policy:  Patients undergoing a surgery or procedure may have one family member or support person with them as long as that person is not COVID-19 positive or experiencing its symptoms.  That person may remain in the waiting area during the procedure.  Inpatient Visitation Update:   In an effort to ensure the safety of our team members and our patients, we are implementing a change to our visitation policy:  Effective Monday, Aug. 9, at 7 a.m., inpatients will be allowed one support person.  o The support person may change daily.  o The support person must pass our screening, gel in and out, and wear a mask at all times, including in the patient's room.  o Patients must also wear a mask when staff or their support person are in the room.  o Masking is required regardless of vaccination status.  Systemwide, no visitors 17 or younger.

## 2020-07-27 NOTE — Telephone Encounter (Signed)
LMOM informing patient to start medication sent to [facility].

## 2020-07-27 NOTE — Telephone Encounter (Signed)
I think it is best that we go ahead and get the stent exchanged as scheduled and then we can also get a new Foley catheter and at the same time.  This would be very helpful in reducing her risk for another infection.

## 2020-07-27 NOTE — Telephone Encounter (Signed)
Patient's husband called stating that she is still in rehab and wants to know if she should proceed with stent placement? Also if she does have surgery can they request cath placement? Patient has not been able to CIC while in rehab

## 2020-07-27 NOTE — Telephone Encounter (Signed)
-----   Message from Nori Riis, PA-C sent at 07/26/2020  5:01 PM EDT ----- Please start fluconazole 100 mg QD x 7 days.

## 2020-07-27 NOTE — Telephone Encounter (Signed)
Patient's husband called and left a vmail, patient is not at home she is in [facility] and medication will need to be called in there He requests a call back

## 2020-07-27 NOTE — Telephone Encounter (Signed)
Unable to reach patient's husband. LM for patient to return call

## 2020-07-28 ENCOUNTER — Telehealth: Payer: Self-pay | Admitting: Student in an Organized Health Care Education/Training Program

## 2020-07-28 DIAGNOSIS — G8929 Other chronic pain: Secondary | ICD-10-CM

## 2020-07-28 DIAGNOSIS — M533 Sacrococcygeal disorders, not elsewhere classified: Secondary | ICD-10-CM

## 2020-07-28 DIAGNOSIS — G894 Chronic pain syndrome: Secondary | ICD-10-CM

## 2020-07-28 NOTE — Telephone Encounter (Signed)
Patient's husband called stating patient is having increased pain and would like to get another SI Joint injection. Does she have to come in for visit or can we schedule procedure.

## 2020-07-28 NOTE — Telephone Encounter (Signed)
LMOM for patient's husband to return call.

## 2020-07-28 NOTE — Telephone Encounter (Signed)
Please advise 

## 2020-07-29 ENCOUNTER — Other Ambulatory Visit: Payer: Medicare Other

## 2020-07-29 NOTE — Telephone Encounter (Signed)
LVMAIL asking patients Husband to call and confirm appt for 08-15-20 at 10:40 for SI Joint Injections

## 2020-07-30 ENCOUNTER — Encounter: Payer: Self-pay | Admitting: Urology

## 2020-08-01 ENCOUNTER — Other Ambulatory Visit: Payer: Self-pay

## 2020-08-01 ENCOUNTER — Other Ambulatory Visit
Admission: RE | Admit: 2020-08-01 | Discharge: 2020-08-01 | Disposition: A | Discharge status: Home / Self Care | Payer: No Typology Code available for payment source | Source: Ambulatory Visit | Attending: Urology | Admitting: Urology

## 2020-08-01 DIAGNOSIS — Z01812 Encounter for preprocedural laboratory examination: Secondary | ICD-10-CM | POA: Diagnosis present

## 2020-08-01 DIAGNOSIS — Z20822 Contact with and (suspected) exposure to covid-19: Secondary | ICD-10-CM | POA: Insufficient documentation

## 2020-08-01 LAB — SARS CORONAVIRUS 2 (TAT 6-24 HRS): SARS Coronavirus 2: NEGATIVE

## 2020-08-01 MED ORDER — CEFAZOLIN SODIUM-DEXTROSE 1-4 GM/50ML-% IV SOLN
1.0000 g | INTRAVENOUS | Status: AC
  Administered 2020-08-02: 1 g via INTRAVENOUS

## 2020-08-01 MED ORDER — ORAL CARE MOUTH RINSE: 15.0000 mL | Freq: Once | OROMUCOSAL | Status: AC

## 2020-08-01 MED ORDER — CHLORHEXIDINE GLUCONATE 0.12 % MT SOLN: 15.0000 mL | Freq: Once | OROMUCOSAL | Status: AC

## 2020-08-01 MED ORDER — SODIUM CHLORIDE 0.9 % IV SOLN: INTRAVENOUS | Status: DC

## 2020-08-01 MED ORDER — FAMOTIDINE 20 MG PO TABS: 20.0000 mg | ORAL_TABLET | Freq: Once | ORAL | Status: AC

## 2020-08-02 ENCOUNTER — Ambulatory Visit
Admission: RE | Admit: 2020-08-02 | Discharge: 2020-08-02 | Disposition: A | Discharge status: Rehabilitation Facility | Payer: Medicare Other | Attending: Urology | Admitting: Urology

## 2020-08-02 ENCOUNTER — Encounter
Admission: RE | Disposition: A | Discharge status: Rehabilitation Facility | Payer: Self-pay | Source: Home / Self Care | Attending: Urology

## 2020-08-02 ENCOUNTER — Encounter: Payer: Self-pay | Admitting: Urology

## 2020-08-02 ENCOUNTER — Other Ambulatory Visit: Payer: Self-pay

## 2020-08-02 ENCOUNTER — Telehealth: Payer: Self-pay | Admitting: Urology

## 2020-08-02 ENCOUNTER — Ambulatory Visit: Payer: Medicare Other

## 2020-08-02 ENCOUNTER — Ambulatory Visit: Payer: Medicare Other | Admitting: Anesthesiology

## 2020-08-02 ENCOUNTER — Other Ambulatory Visit: Payer: Self-pay | Admitting: *Deleted

## 2020-08-02 DIAGNOSIS — N135 Crossing vessel and stricture of ureter without hydronephrosis: Secondary | ICD-10-CM | POA: Diagnosis not present

## 2020-08-02 DIAGNOSIS — Z886 Allergy status to analgesic agent status: Secondary | ICD-10-CM | POA: Diagnosis not present

## 2020-08-02 DIAGNOSIS — Z87440 Personal history of urinary (tract) infections: Secondary | ICD-10-CM | POA: Diagnosis not present

## 2020-08-02 DIAGNOSIS — R339 Retention of urine, unspecified: Secondary | ICD-10-CM | POA: Insufficient documentation

## 2020-08-02 DIAGNOSIS — R338 Other retention of urine: Secondary | ICD-10-CM

## 2020-08-02 DIAGNOSIS — Z885 Allergy status to narcotic agent status: Secondary | ICD-10-CM | POA: Insufficient documentation

## 2020-08-02 DIAGNOSIS — N200 Calculus of kidney: Secondary | ICD-10-CM

## 2020-08-02 DIAGNOSIS — N1330 Unspecified hydronephrosis: Secondary | ICD-10-CM

## 2020-08-02 DIAGNOSIS — N390 Urinary tract infection, site not specified: Secondary | ICD-10-CM

## 2020-08-02 HISTORY — PX: CYSTOSCOPY WITH STENT PLACEMENT: SHX5790

## 2020-08-02 LAB — GLUCOSE, CAPILLARY
Glucose-Capillary: 138 mg/dL — ABNORMAL HIGH (ref 70–99)
Glucose-Capillary: 125 mg/dL — ABNORMAL HIGH (ref 70–99)

## 2020-08-02 SURGERY — CYSTOSCOPY, WITH STENT INSERTION
Anesthesia: General | Site: Ureter | Laterality: Right

## 2020-08-02 MED ORDER — FENTANYL CITRATE (PF) 100 MCG/2ML IJ SOLN
INTRAMUSCULAR | Status: AC
  Filled 2020-08-02: qty 2

## 2020-08-02 MED ORDER — ONDANSETRON HCL 4 MG/2ML IJ SOLN: 4.0000 mg | Freq: Once | INTRAMUSCULAR | Status: DC | PRN

## 2020-08-02 MED ORDER — ONDANSETRON HCL 4 MG/2ML IJ SOLN
INTRAMUSCULAR | Status: DC | PRN
  Administered 2020-08-02: 4 mg via INTRAVENOUS

## 2020-08-02 MED ORDER — CEFAZOLIN SODIUM-DEXTROSE 1-4 GM/50ML-% IV SOLN
INTRAVENOUS | Status: AC
  Filled 2020-08-02: qty 50

## 2020-08-02 MED ORDER — LIDOCAINE HCL (CARDIAC) PF 100 MG/5ML IV SOSY
PREFILLED_SYRINGE | INTRAVENOUS | Status: DC | PRN
  Administered 2020-08-02: 60 mg via INTRAVENOUS

## 2020-08-02 MED ORDER — CHLORHEXIDINE GLUCONATE 0.12 % MT SOLN
OROMUCOSAL | Status: AC
  Administered 2020-08-02: 15 mL via OROMUCOSAL
  Filled 2020-08-02: qty 15

## 2020-08-02 MED ORDER — MIDAZOLAM HCL 2 MG/2ML IJ SOLN
INTRAMUSCULAR | Status: AC
  Filled 2020-08-02: qty 2

## 2020-08-02 MED ORDER — PROPOFOL 10 MG/ML IV BOLUS
INTRAVENOUS | Status: DC | PRN
  Administered 2020-08-02: 30 mg via INTRAVENOUS
  Administered 2020-08-02: 120 mg via INTRAVENOUS

## 2020-08-02 MED ORDER — IOHEXOL 180 MG/ML  SOLN
INTRAMUSCULAR | Status: DC | PRN
  Administered 2020-08-02: 20 mL

## 2020-08-02 MED ORDER — DEXAMETHASONE SODIUM PHOSPHATE 10 MG/ML IJ SOLN
INTRAMUSCULAR | Status: DC | PRN
  Administered 2020-08-02: 8 mg via INTRAVENOUS

## 2020-08-02 MED ORDER — FAMOTIDINE 20 MG PO TABS
ORAL_TABLET | ORAL | Status: AC
  Administered 2020-08-02: 20 mg via ORAL
  Filled 2020-08-02: qty 1

## 2020-08-02 MED ORDER — PROPOFOL 10 MG/ML IV BOLUS
INTRAVENOUS | Status: AC
  Filled 2020-08-02: qty 20

## 2020-08-02 MED ORDER — FENTANYL CITRATE (PF) 100 MCG/2ML IJ SOLN: 25.0000 ug | INTRAMUSCULAR | Status: DC | PRN

## 2020-08-02 MED ORDER — FENTANYL CITRATE (PF) 100 MCG/2ML IJ SOLN
INTRAMUSCULAR | Status: DC | PRN
  Administered 2020-08-02: 25 ug via INTRAVENOUS

## 2020-08-02 MED ORDER — LIDOCAINE HCL (PF) 2 % IJ SOLN
INTRAMUSCULAR | Status: AC
  Filled 2020-08-02: qty 5

## 2020-08-02 SURGICAL SUPPLY — 19 items
BAG DRAIN CYSTO-URO LG1000N (MISCELLANEOUS) ×2 IMPLANT
BRUSH SCRUB EZ 1% IODOPHOR (MISCELLANEOUS) ×2 IMPLANT
CATH URETL 5X70 OPEN END (CATHETERS) IMPLANT
GLOVE BIOGEL PI IND STRL 7.5 (GLOVE) ×1 IMPLANT
GLOVE BIOGEL PI INDICATOR 7.5 (GLOVE) ×1
GOWN STRL REUS W/ TWL XL LVL3 (GOWN DISPOSABLE) ×1 IMPLANT
GOWN STRL REUS W/TWL XL LVL3 (GOWN DISPOSABLE) ×2
GUIDEWIRE STR DUAL SENSOR (WIRE) ×2 IMPLANT
KIT TURNOVER CYSTO (KITS) ×2 IMPLANT
PACK CYSTO AR (MISCELLANEOUS) ×2 IMPLANT
SET CYSTO W/LG BORE CLAMP LF (SET/KITS/TRAYS/PACK) ×2 IMPLANT
SOL .9 NS 3000ML IRR  AL (IV SOLUTION) ×1
SOL .9 NS 3000ML IRR AL (IV SOLUTION) ×1
SOL .9 NS 3000ML IRR UROMATIC (IV SOLUTION) ×1 IMPLANT
STENT URET 6FRX24 CONTOUR (STENTS) IMPLANT
STENT URET 6FRX26 CONTOUR (STENTS) IMPLANT
STENT URO INLAY 6FRX24CM (STENTS) ×2 IMPLANT
SURGILUBE 2OZ TUBE FLIPTOP (MISCELLANEOUS) ×2 IMPLANT
WATER STERILE IRR 1000ML POUR (IV SOLUTION) ×2 IMPLANT

## 2020-08-02 NOTE — OR Nursing (Signed)
Discharge pending [facility] transportation.

## 2020-08-02 NOTE — Discharge Instructions (Addendum)
DISCHARGE INSTRUCTIONS FOR KIDNEY STONE/URETERAL STENT   MEDICATIONS:  1. Resume all your other meds from home.   ACTIVITY:  1. May resume regular activities in 24 hours. 2. No driving while on narcotic pain medications  3. Drink plenty of water  4. Continue to walk at home - you can still get blood clots when you are at home, so keep active, but don't over do it.  5. May return to work/school tomorrow or when you feel ready    SIGNS/SYMPTOMS TO CALL:  Please call us if you have a fever greater than 101.5, uncontrolled nausea/vomiting, uncontrolled pain, dizziness, excessively bloody urine, catheter drainage problems, chest pain, shortness of breath, leg swelling, leg pain, or any other concerns or questions.   You can reach [place] at [phone].   FOLLOW-UP:  1.  7-month PA follow-up with [facility]    Indwelling Urinary Catheter Care, Adult An indwelling urinary catheter is a thin tube that is put into your bladder. The tube helps to drain pee (urine) out of your body. The tube goes in through your urethra. Your urethra is where pee comes out of your body. Your pee will come out through the catheter, then it will go into a bag (drainage bag). Take good care of your catheter so it will work well. How to wear your catheter and bag Supplies needed  Sticky tape (adhesive tape) or a leg strap.  Alcohol wipe or soap and water (if you use tape).  A clean towel (if you use tape).  Large overnight bag.  Smaller bag (leg bag). Wearing your catheter Attach your catheter to your leg with tape or a leg strap.  Make sure the catheter is not pulled tight.  If a leg strap gets wet, take it off and put on a dry strap.  If you use tape to hold the bag on your leg: 1. Use an alcohol wipe or soap and water to wash your skin where the tape made it sticky before. 2. Use a clean towel to pat-dry that skin. 3. Use new tape to make the bag stay on your leg. Wearing your bags You should have been  given a large overnight bag.  You may wear the overnight bag in the day or night.  Always have the overnight bag lower than your bladder.  Do not let the bag touch the floor.  Before you go to sleep, put a clean plastic bag in a wastebasket. Then hang the overnight bag inside the wastebasket. You should also have a smaller leg bag that fits under your clothes.  Always wear the leg bag below your knee.  Do not wear your leg bag at night. How to care for your skin and catheter Supplies needed  A clean washcloth.  Water and mild soap.  A clean towel. Caring for your skin and catheter      Clean the skin around your catheter every day: 1. Wash your hands with soap and water. 2. Wet a clean washcloth in warm water and mild soap. 3. Clean the skin around your urethra.  If you are female:  Gently spread the folds of skin around your vagina (labia).  With the washcloth in your other hand, wipe the inner side of your labia on each side. Wipe from front to back.  If you are female:  Pull back any skin that covers the end of your penis (foreskin).  With the washcloth in your other hand, wipe your penis in small circles. Start  wiping at the tip of your penis, then move away from the catheter.  Move the foreskin back in place, if needed. 4. With your free hand, hold the catheter close to where it goes into your body.  Keep holding the catheter during cleaning so it does not get pulled out. 5. With the washcloth in your other hand, clean the catheter.  Only wipe downward on the catheter.  Do not wipe upward toward your body. Doing this may push germs into your urethra and cause infection. 6. Use a clean towel to pat-dry the catheter and the skin around it. Make sure to wipe off all soap. 7. Wash your hands with soap and water.  Shower every day. Do not take baths.  Do not use cream, ointment, or lotion on the area where the catheter goes into your body, unless your doctor  tells you to.  Do not use powders, sprays, or lotions on your genital area.  Check your skin around the catheter every day for signs of infection. Check for: ? Redness, swelling, or pain. ? Fluid or blood. ? Warmth. ? Pus or a bad smell. How to empty the bag Supplies needed  Rubbing alcohol.  Gauze pad or cotton ball.  Tape or a leg strap. Emptying the bag Pour the pee out of your bag when it is ?- full, or at least 2-3 times a day. Do this for your overnight bag and your leg bag. 1. Wash your hands with soap and water. 2. Separate (detach) the bag from your leg. 3. Hold the bag over the toilet or a clean pail. Keep the bag lower than your hips and bladder. This is so the pee (urine) does not go back into the tube. 4. Open the pour spout. It is at the bottom of the bag. 5. Empty the pee into the toilet or pail. Do not let the pour spout touch any surface. 6. Put rubbing alcohol on a gauze pad or cotton ball. 7. Use the gauze pad or cotton ball to clean the pour spout. 8. Close the pour spout. 9. Attach the bag to your leg with tape or a leg strap. 10. Wash your hands with soap and water. Follow instructions for cleaning the drainage bag:  From the product maker.  As told by your doctor. How to change the bag Supplies needed  Alcohol wipes.  A clean bag.  Tape or a leg strap. Changing the bag Replace your bag when it starts to leak, smell bad, or look dirty. 1. Wash your hands with soap and water. 2. Separate the dirty bag from your leg. 3. Pinch the catheter with your fingers so that pee does not spill out. 4. Separate the catheter tube from the bag tube where these tubes connect (at the connection valve). Do not let the tubes touch any surface. 5. Clean the end of the catheter tube with an alcohol wipe. Use a different alcohol wipe to clean the end of the bag tube. 6. Connect the catheter tube to the tube of the clean bag. 7. Attach the clean bag to your leg with  tape or a leg strap. Do not make the bag tight on your leg. 8. Wash your hands with soap and water. General rules   Never pull on your catheter. Never try to take it out. Doing that can hurt you.  Always wash your hands before and after you touch your catheter or bag. Use a mild, fragrance-free soap. If you do not  have soap and water, use hand sanitizer.  Always make sure there are no twists or bends (kinks) in the catheter tube.  Always make sure there are no leaks in the catheter or bag.  Drink enough fluid to keep your pee pale yellow.  Do not take baths, swim, or use a hot tub.  If you are female, wipe from front to back after you poop (have a bowel movement). Contact a doctor if:  Your pee is cloudy.  Your pee smells worse than usual.  Your catheter gets clogged.  Your catheter leaks.  Your bladder feels full. Get help right away if:  You have redness, swelling, or pain where the catheter goes into your body.  You have fluid, blood, pus, or a bad smell coming from the area where the catheter goes into your body.  Your skin feels warm where the catheter goes into your body.  You have a fever.  You have pain in your: ? Belly (abdomen). ? Legs. ? Lower back. ? Bladder.  You see blood in the catheter.  Your pee is pink or red.  You feel sick to your stomach (nauseous).  You throw up (vomit).  You have chills.  Your pee is not draining into the bag.  Your catheter gets pulled out. Summary  An indwelling urinary catheter is a thin tube that is placed into the bladder to help drain pee (urine) out of the body.  The catheter is placed into the part of the body that drains pee from the bladder (urethra).  Taking good care of your catheter will keep it working properly and help prevent problems.  Always wash your hands before and after touching your catheter or bag.  Never pull on your catheter or try to take it out. This information is not intended to  replace advice given to you by your health care provider. Make sure you discuss any questions you have with your health care provider. Document Revised: 01/09/2019 Document Reviewed: 05/03/2017 Elsevier Patient Education  2020 [place]   1) The drugs that you were given will stay in your system until tomorrow so for the next 24 hours you should not:  A) Drive an automobile B) Make any legal decisions C) Drink any alcoholic beverage   2) You may resume regular meals tomorrow.  Today it is better to start with liquids and gradually work up to solid foods.  You may eat anything you prefer, but it is better to start with liquids, then soup and crackers, and gradually work up to solid foods.   3) Please notify your doctor immediately if you have any unusual bleeding, trouble breathing, redness and pain at the surgery site, drainage, fever, or pain not relieved by medication.    4) Additional Instructions:        Please contact your physician with any problems or Same Day Surgery at [phone], Monday through Friday 6 am to 4 pm, or [place] at [facility] number at [phone].

## 2020-08-02 NOTE — Op Note (Signed)
Preoperative diagnosis:  1. Chronic right UPJ obstruction 2. Recurrent UTI 3. Chronic urinary retention  Postoperative diagnosis:  1. Same  Procedure:  1. Cystoscopy 2. Right ureteral stent exchange (6FR/24 cm Bard Optima) 3. Right retrograde pyelography with interpretation   Surgeon: Nicki Reaper C. Ruthene Methvin, M.D.  Anesthesia: General  Complications: None  Intraoperative findings:  1.  Cystoscopy-no solid or papillary lesions; inflammatory changes bladder secondary to indwelling stent and Foley catheter  2.  Right retrograde pyelogram-mild to moderate right hydronephrosis with a normal caliber ureter consistent with chronic UPJ obstruction  EBL: Minimal  Specimens: None  Indication: Stephanie [name] is a 79 y.o. patient a chronic right UPJ obstruction.  She was admitted in September 2021 with sepsis of urinary origin and worsening right hydronephrosis and underwent stent placement.  The patient has elected chronic stent drainage.  After reviewing the management options for treatment, he elected to proceed with the above surgical procedure(s). We have discussed the potential benefits and risks of the procedure, side effects of the proposed treatment, the likelihood of the patient achieving the goals of the procedure, and any potential problems that might occur during the procedure or recuperation. Informed consent has been obtained.  Description of procedure:  The patient was taken to the operating room and general anesthesia was induced.  The patient was placed in the dorsal lithotomy position, prepped and draped in the usual sterile fashion, and preoperative antibiotics were administered. A preoperative time-out was performed.   A 21 French cystoscope was lubricated and passed per urethra.  Panendoscopy was then performed with findings as described above.  The right ureteral stent was grasped with endoscopic forceps and brought out to the urethral meatus.  A 0.038 Sensor wire was placed  through the stent and advanced to the region of the renal pelvis under fluoroscopic guidance and the stent was removed.  A 5 French open-ended ureteral catheter was placed over the guidewire to the region of the renal pelvis and retrograde pyelogram was performed with findings as described above.  The ureteral catheter was removed and a 6FR/24 cm Bard Optima ureteral stent was placed over the guidewire under fluoroscopic guidance without difficulty.  Good stent curls were noted proximally and distally under fluoroscopy.  The cystoscope was reinserted and the distal and the stent was well positioned in the bladder.  The cystoscope was removed and a 26 French Foley catheter was placed to gravity drainage.  After anesthetic reversal she was transported to the PACU in stable condition.  Plan:  Follow-up office visit 3 months with KUB  Based on symptoms and KUB decide at that time the timing of next stent exchange    Stephanie Giovanni, MD

## 2020-08-02 NOTE — OR Nursing (Signed)
Dr Lubertha Basque in to see patient, aware of last potassium of 3.3, aware patient was with roommate at facility, [employer] given 08/01/20 no new orders.

## 2020-08-02 NOTE — Anesthesia Preprocedure Evaluation (Addendum)
Anesthesia Evaluation  Patient identified by MRN, date of birth, ID band Patient awake    Reviewed: Allergy & Precautions, H&P , NPO status , Patient's Chart, lab work & pertinent test results  History of Anesthesia Complications (+) PONV  Airway Mallampati: II  TM Distance: <3 FB Neck ROM: full   Comment: Small chin Dental  (+) Chipped   Pulmonary asthma , neg sleep apnea, neg COPD,    breath sounds clear to auscultation       Cardiovascular hypertension, (-) angina(-) Past MI and (-) Cardiac Stents + dysrhythmias Atrial Fibrillation  Rhythm:regular Rate:Normal     Neuro/Psych negative neurological ROS  negative psych ROS   GI/Hepatic Neg liver ROS, GERD  ,  Endo/Other  diabetes  Renal/GU Renal disease (CKD)     Musculoskeletal   Abdominal   Peds  Hematology  (+) Blood dyscrasia, anemia ,   Anesthesia Other Findings Past Medical History: No date: Anemia No date: Arthritis No date: Asthma 1996: Cancer ([place])     Comment:  colon No date: Chronic kidney disease No date: Complication of anesthesia     Comment:  nausea No date: Diabetes mellitus without complication (HCC) No date: GERD (gastroesophageal reflux disease) No date: Glaucoma No date: Hyperlipidemia No date: Hypertension No date: Lumbar disc disease No date: Small bowel obstruction (HCC) No date: Vitamin B 12 deficiency  Past Surgical History: No date: ABDOMINAL HYSTERECTOMY No date: BACK SURGERY No date: COLON SURGERY 05/22/2017: COLONOSCOPY WITH PROPOFOL; N/A     Comment:  Procedure: COLONOSCOPY WITH PROPOFOL;  Surgeon: Manya Silvas, MD;  Location: [facility];  Service:               Endoscopy;  Laterality: N/A; 06/18/2020: CYSTOSCOPY WITH STENT PLACEMENT; Right     Comment:  Procedure: [place];  Surgeon:               Irine Seal, MD;  Location: [facility] ORS;  Service: Urology;                Laterality: Right; No date: FRACTURE SURGERY No date: KNEE ARTHROSCOPY 06/30/2020: LAPAROTOMY; N/A     Comment:  Procedure: EXPLORATORY LAPAROTOMY;  Surgeon: Jules Husbands, MD;  Location: [facility] ORS;  Service: General;                Laterality: N/A; No date: LUMBAR LAMINECTOMY 06/27/2020: TEMPORARY DIALYSIS CATHETER; N/A     Comment:  Procedure: TEMPORARY DIALYSIS CATHETER;  Surgeon:               Katha Cabal, MD;  Location: [place] [facility];               Service: Cardiovascular;  Laterality: N/A;     Reproductive/Obstetrics negative OB ROS                            Anesthesia Physical Anesthesia Plan  ASA: III  Anesthesia Plan: General LMA   Post-op Pain Management:    Induction:   PONV Risk Score and Plan: Dexamethasone, Ondansetron and Treatment may vary due to age or medical condition  Airway Management Planned:   Additional Equipment:   Intra-op Plan:   Post-operative Plan:   Informed Consent: I have reviewed the patients History and Physical,  chart, labs and discussed the procedure including the risks, benefits and alternatives for the proposed anesthesia with the patient or authorized representative who has indicated his/her understanding and acceptance.     Dental Advisory Given  Plan Discussed with: Anesthesiologist, CRNA and Surgeon  Anesthesia Plan Comments:         Anesthesia Quick Evaluation

## 2020-08-02 NOTE — Telephone Encounter (Signed)
APP MADE LM FOR PT TO CB TO CONFIRM

## 2020-08-02 NOTE — Interval H&P Note (Signed)
History and Physical Interval Note:  79 y.o. female with history of recurrent UTI and UPJ obstruction status post insertion of an indwelling right ureteral stent.  She has elected long-term stent drainage and presents today for cystoscopy with right ureteral stent exchange.  The procedure has been discussed and she has elected to proceed.  CV: RRR Lungs: Clear  08/02/2020 7:28 AM  Stephanie [name] General  has presented today for surgery, with the diagnosis of right ureteropelvic junction obstruction.  The various methods of treatment have been discussed with the patient and family. After consideration of risks, benefits and other options for treatment, the patient has consented to  Procedure(s): CYSTOSCOPY WITH STENT EXCHANGE (Right) as a surgical intervention.  The patient's history has been reviewed, patient examined, no change in status, stable for surgery.  I have reviewed the patient's chart and labs.  Questions were answered to the patient's satisfaction.     [place]

## 2020-08-02 NOTE — Anesthesia Procedure Notes (Signed)
Procedure Name: LMA Insertion Date/Time: 08/02/2020 7:50 AM Performed by: Allean Found, CRNA Pre-anesthesia Checklist: Patient identified, Patient being monitored, Timeout performed, Emergency Drugs available and Suction available Patient Re-evaluated:Patient Re-evaluated prior to induction Oxygen Delivery Method: Circle system utilized Preoxygenation: Pre-oxygenation with 100% oxygen Induction Type: IV induction Ventilation: Mask ventilation without difficulty LMA: LMA inserted LMA Size: 3.0 Tube type: Oral Number of attempts: 1 Placement Confirmation: positive ETCO2 and breath sounds checked- equal and bilateral Tube secured with: Tape Dental Injury: Teeth and Oropharynx as per pre-operative assessment

## 2020-08-02 NOTE — Telephone Encounter (Signed)
-----   Message from Abbie Sons, MD sent at 08/02/2020  8:33 AM EDT ----- Regarding: Follow-up Please schedule 3 month follow-up with KUB/PA visit

## 2020-08-02 NOTE — OR Nursing (Signed)
Reconciled medication with Adena LPN at [facility]

## 2020-08-02 NOTE — Transfer of Care (Signed)
Immediate Anesthesia Transfer of Care Note  Patient: Stephanie [name]  Procedure(s) Performed: CYSTOSCOPY WITH STENT EXCHANGE (Right Ureter)  Patient Location: PACU  Anesthesia Type:General  Level of Consciousness: sedated and unresponsive  Airway & Oxygen Therapy: Patient Spontanous Breathing and Patient connected to face mask oxygen  Post-op Assessment: Report given to RN and Post -op Vital signs reviewed and stable  Post vital signs: Reviewed and stable  Last Vitals:  Vitals Value Taken Time  BP 141/69 08/02/20 0826  Temp 36.7 C 08/02/20 0826  Pulse 76 08/02/20 0830  Resp 20 08/02/20 0830  SpO2 100 % 08/02/20 0830  Vitals shown include unvalidated device data.  Last Pain:  Vitals:   08/02/20 0826  TempSrc:   PainSc: Asleep         Complications: No complications documented.

## 2020-08-03 ENCOUNTER — Encounter: Payer: Self-pay | Admitting: Urology

## 2020-08-03 NOTE — Anesthesia Postprocedure Evaluation (Signed)
Anesthesia Post Note  Patient: Stephanie [name]  Procedure(s) Performed: CYSTOSCOPY WITH STENT EXCHANGE (Right Ureter)  Patient location during evaluation: PACU Anesthesia Type: General Level of consciousness: awake and alert Pain management: pain level controlled Vital Signs Assessment: post-procedure vital signs reviewed and stable Respiratory status: spontaneous breathing, nonlabored ventilation and respiratory function stable Cardiovascular status: blood pressure returned to baseline and stable Postop Assessment: no apparent nausea or vomiting Anesthetic complications: no   No complications documented.   Last Vitals:  Vitals:   08/02/20 0934 08/02/20 0958  BP: (!) 156/66 (!) 147/73  Pulse: 89 90  Resp: 18 16  Temp:    SpO2: 99% 99%    Last Pain:  Vitals:   08/02/20 0958  TempSrc:   PainSc: 0-No pain                 Brett Canales Seth Higginbotham

## 2020-08-08 ENCOUNTER — Telehealth: Payer: Self-pay | Admitting: *Deleted

## 2020-08-08 NOTE — Telephone Encounter (Signed)
Just FYI, they checked her urine at [facility] and she was negative.

## 2020-08-15 ENCOUNTER — Ambulatory Visit: Payer: Medicare Other | Admitting: Student in an Organized Health Care Education/Training Program

## 2020-08-18 DIAGNOSIS — N135 Crossing vessel and stricture of ureter without hydronephrosis: Secondary | ICD-10-CM | POA: Insufficient documentation

## 2020-08-18 DIAGNOSIS — R809 Proteinuria, unspecified: Secondary | ICD-10-CM | POA: Insufficient documentation

## 2020-08-19 NOTE — Telephone Encounter (Signed)
Error

## 2020-08-23 ENCOUNTER — Other Ambulatory Visit: Payer: Self-pay | Admitting: Urology

## 2020-08-29 ENCOUNTER — Encounter: Payer: Self-pay | Admitting: Urology

## 2020-10-19 ENCOUNTER — Ambulatory Visit: Payer: Self-pay | Admitting: Urology

## 2020-11-02 NOTE — Progress Notes (Signed)
11/03/2020 9:35 AM   [name] Aug 02, 1941 [id number]  Referring provider: Rusty Aus, MD [facility] [place],  [place] [address]  Chief Complaint  Patient presents with  . Follow-up   Urological history: 1. 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 rUTI's - contributing factors of age, indwelling Foley and vaginal atrophy - documented positive urine cultures  Date Organism  Antibiotic   11/16/2019 E. Coli - resistant ampicillin, cefazolin, tetracycline, trimethoprim/sulfa Asymptomatic - no antibiotic  12/03/2019 Klebsiella pneumoniae - resistant ampicillin Keflex 500 mg BID x 7 days  01/24/2020 Klebsiella pneumoniae - resistant ampicillin, nitrofurantoin Keflex 500 mg BID x 5 days  03/04/2020 E. Coli - resistant cipro, levaquin, tetracycline, trimethoprim/sulfa Nitrofurantoin 100 mg BID x 5 days  05/13/2020 Klebsiella pneumoniae - resistant ampicillin and E. Coli resistant to cipro, levaquin, tetracycline, trimethoprim/sulfa Augmentin 875/125, x 7 days  06/17/2020 Klebsiella pneumoniae- resistant to ampicillin Rocephin x 5 days  07/21/2020 Yeast, Klebsiella pneumoniae - resistant to ampicillin, Pseudomonas aeruginosa  Fluconazole 100 mg daily x 10 days Unknown antibiotic       3. 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 female who presents today for three month follow up with her husband, Stephanie [name].  She states she has been doing very well since the insertion of the ureteral stent.  She has not had any further UTI's.  She denies any discomfort with the stent.  She continues to have an indwelling Foley that is exchanged monthly by home health nurse.  Foley is currently in place and draining clear yellow urine.  She is wondering if it can be taken out.   Patient denies any modifying or aggravating factors.  Patient denies any gross  hematuria, dysuria or suprapubic/flank pain.  Patient denies any fevers, chills, nausea or vomiting.   KUB 11/03/2020 shows the right ureteral stent in good position and no calcifications along the stent.    PMH: Past Medical History:  Diagnosis Date  . Anemia   . Arthritis   . Asthma   . Cancer ([place]) 1996   colon  . Chronic kidney disease   . Complication of anesthesia    nausea  . Diabetes mellitus without complication ([place])   . GERD (gastroesophageal reflux disease)   . Glaucoma   . Hyperlipidemia   . Hypertension   . Lumbar disc disease   . Small bowel obstruction ([place])   . Vitamin B 12 deficiency     Surgical History: Past Surgical History:  Procedure Laterality Date  . ABDOMINAL HYSTERECTOMY    . BACK SURGERY    . COLON SURGERY    . COLONOSCOPY WITH PROPOFOL N/A 05/22/2017   Procedure: COLONOSCOPY WITH PROPOFOL;  Surgeon: Manya Silvas, MD;  Location: [facility] ENDOSCOPY;  Service: Endoscopy;  Laterality: N/A;  . CYSTOSCOPY WITH STENT PLACEMENT Right 08/02/2020   Procedure: CYSTOSCOPY WITH STENT EXCHANGE;  Surgeon: Abbie Sons, MD;  Location: [facility] ORS;  Service: Urology;  Laterality: Right;  . CYSTOSCOPY WITH STENT PLACEMENT Right 06/18/2020   Procedure: CYSTOSCOPY WITH STENT PLACEMENT;  Surgeon: Irine Seal, MD;  Location: [facility] ORS;  Service: Urology;  Laterality: Right;  . FRACTURE SURGERY    . KNEE ARTHROSCOPY    . LAPAROTOMY N/A 06/30/2020   Procedure: EXPLORATORY LAPAROTOMY;  Surgeon: Jules Husbands, MD;  Location: [facility] ORS;  Service: General;  Laterality: N/A;  .  LUMBAR LAMINECTOMY    . TEMPORARY DIALYSIS CATHETER N/A 06/27/2020   Procedure: TEMPORARY DIALYSIS CATHETER;  Surgeon: Katha Cabal, MD;  Location: [facility];  Service: Cardiovascular;  Laterality: N/A;    Home Medications:  Allergies as of 11/03/2020      Reactions   Ibuprofen Itching   Indomethacin    Infliximab    Methotrexate Derivatives    Moexipril    Percocet  [oxycodone-acetaminophen] Itching   Naprosyn [naproxen] Rash      Medication List       Accurate as of November 03, 2020 11:59 PM. If you have any questions, ask your nurse or doctor.        STOP taking these medications   fluconazole 100 MG tablet Commonly known as: DIFLUCAN Stopped by: Amarra Sawyer, PA-C   magnesium oxide 400 (241.3 Mg) MG tablet Commonly known as: MAG-OX Stopped by: Jentry Mcqueary, PA-C     TAKE these medications   acetaminophen 325 MG tablet Commonly known as: TYLENOL Take 2 tablets (650 mg total) by mouth every 6 (six) hours as needed for moderate pain, fever or headache.   amLODipine 10 MG tablet Commonly known as: NORVASC Take 5 mg by mouth 2 (two) times daily.   ascorbic acid 500 MG tablet Commonly known as: VITAMIN C Take 1 tablet (500 mg total) by mouth daily.   brimonidine 0.2 % ophthalmic solution Commonly known as: ALPHAGAN Place 1 drop into both eyes 2 (two) times daily.   Contour Test test strip Generic drug: glucose blood 1 each by Other route daily.   Cranberry 400 MG Caps Take 400 mg by mouth daily.   diltiazem 120 MG 24 hr capsule Commonly known as: DILACOR XR Take 120 mg by mouth daily.   diltiazem 120 MG 24 hr capsule Commonly known as: CARDIZEM CD Take 1 capsule (120 mg total) by mouth 2 (two) times daily.   diltiazem 120 MG 24 hr capsule Commonly known as: TIAZAC Take by mouth.   estradiol 0.1 MG/GM vaginal cream Commonly known as: ESTRACE Place 1 Applicatorful vaginally at bedtime.   famotidine 20 MG tablet Commonly known as: PEPCID Take 20 mg by mouth 2 (two) times daily.   glipiZIDE 10 MG tablet Commonly known as: GLUCOTROL Take 10 mg by mouth daily before breakfast.   insulin aspart 100 UNIT/ML injection Commonly known as: novoLOG Inject 0-15 Units into the skin 4 (four) times daily -  before meals and at bedtime. What changed:   how much to take  additional instructions   insulin glargine 100  UNIT/ML injection Commonly known as: LANTUS Inject 10 Units into the skin daily at 6 PM.   leflunomide 10 MG tablet Commonly known as: ARAVA Take 10 mg by mouth daily.   magnesium oxide 400 MG tablet Commonly known as: MAG-OX Take by mouth.   metFORMIN 500 MG 24 hr tablet Commonly known as: GLUCOPHAGE-XR Take 500 mg by mouth 2 (two) times daily.   metoprolol tartrate 50 MG tablet Commonly known as: LOPRESSOR Take 50 mg by mouth 2 (two) times daily.   omeprazole 40 MG capsule Commonly known as: PRILOSEC Take 40 mg by mouth daily.   potassium chloride SA 20 MEQ tablet Commonly known as: KLOR-CON Take 20 mEq by mouth daily.   predniSONE 20 MG tablet Commonly known as: DELTASONE Take 40 mg by mouth daily with breakfast.   rosuvastatin 10 MG tablet Commonly known as: CRESTOR Take 10 mg by mouth daily.   tamsulosin  0.4 MG Caps capsule Commonly known as: FLOMAX TAKE 1 CAPSULE(0.4 MG) BY MOUTH DAILY   tiZANidine 4 MG tablet Commonly known as: ZANAFLEX   vitamin B-12 1000 MCG tablet Commonly known as: CYANOCOBALAMIN Take 1,000 mcg by mouth daily.   zinc sulfate 220 (50 Zn) MG capsule Take 1 capsule (220 mg total) by mouth daily.       Allergies:  Allergies  Allergen Reactions  . Ibuprofen Itching  . Indomethacin   . Infliximab   . Methotrexate Derivatives   . Moexipril   . Percocet [Oxycodone-Acetaminophen] Itching  . Naprosyn [Naproxen] Rash    Family History: Family History  Problem Relation Age of Onset  . Breast cancer Neg Hx     Social History:  reports that she has never smoked. She has never used smokeless tobacco. She reports that she does not drink alcohol and does not use drugs.  ROS: Pertinent ROS in HPI  Physical Exam: BP (!) 163/82   Pulse 63   Ht $R'5\' 6"'og$  (1.676 m)   Wt 160 lb (72.6 kg)   BMI 25.82 kg/m   Constitutional:  Well nourished. Alert and oriented, No acute distress. HEENT: [place] AT, mask in place.  Trachea  midline Cardiovascular: No clubbing, cyanosis, or edema. Respiratory: Normal respiratory effort, no increased work of breathing. Neurologic: Grossly intact, no focal deficits, moving all 4 extremities. Psychiatric: Normal mood and affect.   Laboratory Data: Specimen:  Blood - Blood, Venous  Ref Range & Units 1 mo ago Comments  Glucose 65 - 99 mg/dL 129High                                                Fasting reference interval                         For someone without known diabetes, a glucose    value >125 mg/dL indicates that they may have    diabetes and this should be confirmed with a    follow-up test.      BUN 7 - 25 mg/dL 18    Creatinine 0.60 - 0.93 mg/dL 1.10High    For patients >35 years of age, the reference limit    for Creatinine is approximately 13% higher for people    identified as African-American.      eGFR Non-African > OR = 60 mL/min/1.81m2 48Low    eGFR African > OR = 60 mL/min/1.60m2 56Low    BUN/Creatinine Ratio 6 - 22 (calc) 16    Sodium 135 - 146 mmol/L 141    Potassium 3.5 - 5.3 mmol/L 4.3    Chloride 98 - 110 mmol/L 108    Bicarbonate (CO2) 20 - 32 mmol/L 21    Calcium 8.6 - 10.4 mg/dL 9.4    Phosphorus 2.1 - 4.3 mg/dL 3.3    Albumin 3.6 - 5.1 g/dL 3.3Low    Resulting Agency  [id number]    Resulting Agency Comment  Performing Organization Information:   Site ID: [id number]   Name: [facility]   Address: [address] [place], [place] [address]   Director: Cathleen Fears [place] Specimen Collected: 09/12/20 8:46 AM Last Resulted: 09/13/20 5:02 AM  Received From: [facility]  Result Received: 09/22/20 7:35 AM     Specimen:  Blood  Ref Range & Units  2 mo ago  WBC (White Blood Cell Count) 4.1 - 10.2 10^3/uL 12.2High   RBC (Red Blood Cell Count) 4.04 - 5.48 10^6/uL 3.44Low   Hemoglobin 12.0 - 15.0  gm/dL 6.9NPM   Hematocrit 72.2 - 47.0 % 31.5Low   MCV (Mean Corpuscular Volume) 80.0 - 100.0 fl 91.6   MCH (Mean Corpuscular Hemoglobin) 27.0 - 31.2 pg 27.6   MCHC (Mean Corpuscular Hemoglobin Concentration) 32.0 - 36.0 gm/dL 77.3BHG   Platelet Count 150 - 450 10^3/uL 270   RDW-CV (Red Cell Distribution Width) 11.6 - 14.8 % 14.6   MPV (Mean Platelet Volume) 9.4 - 12.4 fl 10.1   Neutrophils 1.50 - 7.80 10^3/uL 9.82High   Lymphocytes 1.00 - 3.60 10^3/uL 1.36   Monocytes 0.00 - 1.50 10^3/uL 0.74   Eosinophils 0.00 - 0.55 10^3/uL 0.16   Basophils 0.00 - 0.09 10^3/uL 0.04   Neutrophil % 32.0 - 70.0 % 80.6High   Lymphocyte % 10.0 - 50.0 % 11.2   Monocyte % 4.0 - 13.0 % 6.1   Eosinophil % 1.0 - 5.0 % 1.3   Basophil% 0.0 - 2.0 % 0.3   Immature Granulocyte % <=0.7 % 0.5   Immature Granulocyte Count <=0.06 10^3/L 0.06   Resulting Agency  [facility]  Specimen Collected: 08/18/20 11:35 AM Last Resulted: 08/18/20 12:24 PM  Received From: [facility]  Result Received: 09/03/20 12:14 PM   Specimen:  Blood  Ref Range & Units 2 mo ago  Hemoglobin A1C 4.2 - 5.6 % 7.1High   Average Blood Glucose (Calc) mg/dL 510   Resulting Agency  [facility]   Narrative Performed by [facility] Normal Range:  4.2 - 5.6%  Increased Risk: 5.7 - 6.4%  Diabetes:    >= 6.5%  Glycemic Control for adults with diabetes: <7%  Specimen Collected: 08/18/20 11:35 AM Last Resulted: 08/18/20 1:35 PM  Received From: [facility]  Result Received: 09/03/20 12:14 PM  I have reviewed the labs.   Pertinent imaging: Narrative & Impression  CLINICAL DATA:  Evaluate for kidney stone.  EXAM: ABDOMEN - 1 VIEW  COMPARISON:  06/30/2020  FINDINGS: Right-sided nephroureteral stent is in place. No kidney stones identified. There is a large stool burden noted within the colon. Status post pedicle screw and rod fixation of  the thoracic, lumbar spine and sacrum.  IMPRESSION: 1. No kidney stones identified. Right nephroureteral stent remains in place. 2. Right nephroureteral stent in place. 3. Large stool burden noted within the colon. Correlate for any clinical signs of constipation.   Electronically Signed   By: Signa Kell M.D.   On: 11/03/2020 14:23   I have independently reviewed the films.  See HPI.   Assessment & Plan:    1.  Incomplete bladder emptying -Explained to her husband and her that she does not empty her bladder completely and this can set her up for recurrent urinary tract infections -Discussed with patient scheduling another voiding trial, restarting CIC or maintaining the indwelling Foley at this time -They have decided to keep the indwelling Foley for now and have home health exchanged every 30 days  2.  Recurrent UTIs -No recent infections -Patient asymptomatic at this time  3. Right hydronephrosis/UPJ obstruction -Patient is tolerating the stent well -We will schedule her for ureteral stent exchange in November 2022 -She will have an appointment in October 2022 to prepare for stent exchange -They will contact us if she should  experience any right-sided flank pain, fevers or gross hematuria  Return for October for office visit .  These notes generated with voice recognition software. I apologize for typographical errors.  Zara Council, PA-C  [facility] [address]  [place], [place] [address] [phone]

## 2020-11-03 ENCOUNTER — Ambulatory Visit
Admission: RE | Admit: 2020-11-03 | Discharge: 2020-11-03 | Disposition: A | Discharge status: Home / Self Care | Payer: Medicare Other | Attending: Urology | Admitting: Urology

## 2020-11-03 ENCOUNTER — Ambulatory Visit (INDEPENDENT_AMBULATORY_CARE_PROVIDER_SITE_OTHER): Payer: Medicare Other | Admitting: Urology

## 2020-11-03 ENCOUNTER — Other Ambulatory Visit: Payer: Self-pay

## 2020-11-03 ENCOUNTER — Ambulatory Visit
Admission: RE | Admit: 2020-11-03 | Discharge: 2020-11-03 | Disposition: A | Discharge status: Home / Self Care | Payer: Medicare Other | Source: Ambulatory Visit | Attending: Urology | Admitting: Urology

## 2020-11-03 VITALS — BP 163/82 | HR 63 | Ht 66.0 in | Wt 160.0 lb

## 2020-11-03 DIAGNOSIS — N135 Crossing vessel and stricture of ureter without hydronephrosis: Secondary | ICD-10-CM

## 2020-11-03 DIAGNOSIS — N390 Urinary tract infection, site not specified: Secondary | ICD-10-CM | POA: Diagnosis not present

## 2020-11-03 DIAGNOSIS — R339 Retention of urine, unspecified: Secondary | ICD-10-CM

## 2020-11-03 DIAGNOSIS — N200 Calculus of kidney: Secondary | ICD-10-CM | POA: Insufficient documentation

## 2020-11-17 ENCOUNTER — Encounter: Payer: Self-pay | Admitting: Urology

## 2020-11-17 ENCOUNTER — Telehealth: Payer: Self-pay | Admitting: Urology

## 2020-11-17 NOTE — Telephone Encounter (Signed)
We need to get her in then for a catheter exchange.

## 2020-11-17 NOTE — Telephone Encounter (Signed)
Spoke to patients husband and appointment has been scheduled.

## 2020-11-17 NOTE — Telephone Encounter (Signed)
Would you call the [facility] and make sure home health is still able to change her catheter every month?

## 2020-11-17 NOTE — Telephone Encounter (Signed)
Patient's husband states they have not seen [facility] in over a month. He doesn't think they are coming back out.

## 2020-11-29 NOTE — Progress Notes (Signed)
Cath Change/ Replacement  Patient is present today for a catheter change due to urinary retention.  17ml of water was removed from the balloon, a 16FR foley cath was removed with out difficulty.  Patient was cleaned and prepped in a sterile fashion with betadine. A 16 FR foley cath was replaced into the bladder no complications were noted Urine return was noted 71ml and urine was yellow in color. The balloon was filled with 80ml of sterile water. A leg bag was attached for drainage.  A night bag was also given to the patient and patient was given instruction on how to change from one bag to another. Patient was given proper instruction on catheter care.    Performed by: Elberta Leatherwood, CMA  Follow up: 1 month for cath exchange

## 2020-11-30 ENCOUNTER — Ambulatory Visit (INDEPENDENT_AMBULATORY_CARE_PROVIDER_SITE_OTHER): Payer: Medicare Other | Admitting: Urology

## 2020-11-30 ENCOUNTER — Encounter: Payer: Self-pay | Admitting: Urology

## 2020-11-30 ENCOUNTER — Other Ambulatory Visit: Payer: Self-pay

## 2020-11-30 DIAGNOSIS — Z466 Encounter for fitting and adjustment of urinary device: Secondary | ICD-10-CM

## 2020-12-05 ENCOUNTER — Other Ambulatory Visit: Payer: Self-pay | Admitting: Urology

## 2020-12-27 NOTE — Progress Notes (Signed)
Cath Change/ Replacement  Patient is present today for a catheter change due to urinary retention.  9 ml of water was removed from the balloon, a 16 FR foley cath was removed with out difficulty.  Patient was cleaned and prepped in a sterile fashion with betadine. A 16 FR foley cath was replaced into the bladder no complications were noted   Urine return was noted 10 ml and urine was yellow clear in color. The balloon was filled with 32ml of sterile water. A leg bag was attached for drainage.  A night bag was also given to the patient and patient was given instruction on how to change from one bag to another. Patient was given proper instruction on catheter care.    Performed by: Zara Council, PA-C  Follow up: One month for Foley catheter exchange

## 2020-12-28 ENCOUNTER — Ambulatory Visit (INDEPENDENT_AMBULATORY_CARE_PROVIDER_SITE_OTHER): Payer: Medicare Other | Admitting: Urology

## 2020-12-28 ENCOUNTER — Other Ambulatory Visit: Payer: Self-pay

## 2020-12-28 ENCOUNTER — Encounter: Payer: Self-pay | Admitting: Urology

## 2020-12-28 VITALS — BP 171/83 | HR 60 | Ht 66.0 in

## 2020-12-28 DIAGNOSIS — Z466 Encounter for fitting and adjustment of urinary device: Secondary | ICD-10-CM | POA: Diagnosis not present

## 2021-01-30 NOTE — Progress Notes (Signed)
Cath Change/ Replacement  Patient is present today for a catheter change due to urinary retention.  9 ml of water was removed from the balloon, a 16 FR foley cath was removed with out difficulty.  Patient was cleaned and prepped in a sterile fashion with betadine. A 16 FR foley cath was replaced into the bladder no complications were noted Urine return was noted 10 ml and urine was yelow in color. The balloon was filled with 35ml of sterile water. A leg bag was attached for drainage.  A night bag was also given to the patient and patient was given instruction on how to change from one bag to another. Patient was given proper instruction on catheter care.    Performed by: Zara Council, PA-C  Follow up: One month for Foley catheter exchange

## 2021-01-31 ENCOUNTER — Other Ambulatory Visit: Payer: Self-pay

## 2021-01-31 ENCOUNTER — Ambulatory Visit (INDEPENDENT_AMBULATORY_CARE_PROVIDER_SITE_OTHER): Payer: Medicare Other | Admitting: Urology

## 2021-01-31 ENCOUNTER — Encounter: Payer: Self-pay | Admitting: Urology

## 2021-01-31 DIAGNOSIS — Z466 Encounter for fitting and adjustment of urinary device: Secondary | ICD-10-CM | POA: Diagnosis not present

## 2021-02-13 ENCOUNTER — Emergency Department: Payer: Medicare Other

## 2021-02-13 ENCOUNTER — Inpatient Hospital Stay
Admission: EM | Admit: 2021-02-13 | Discharge: 2021-02-16 | DRG: 390 | Disposition: A | Discharge status: Home Health | Payer: Medicare Other | Attending: Internal Medicine | Admitting: Internal Medicine

## 2021-02-13 ENCOUNTER — Other Ambulatory Visit: Payer: Self-pay

## 2021-02-13 ENCOUNTER — Encounter: Payer: Self-pay | Admitting: Emergency Medicine

## 2021-02-13 DIAGNOSIS — K56609 Unspecified intestinal obstruction, unspecified as to partial versus complete obstruction: Secondary | ICD-10-CM | POA: Diagnosis not present

## 2021-02-13 DIAGNOSIS — Z981 Arthrodesis status: Secondary | ICD-10-CM

## 2021-02-13 DIAGNOSIS — Z79890 Hormone replacement therapy (postmenopausal): Secondary | ICD-10-CM

## 2021-02-13 DIAGNOSIS — Z85038 Personal history of other malignant neoplasm of large intestine: Secondary | ICD-10-CM

## 2021-02-13 DIAGNOSIS — E1122 Type 2 diabetes mellitus with diabetic chronic kidney disease: Secondary | ICD-10-CM | POA: Diagnosis present

## 2021-02-13 DIAGNOSIS — K567 Ileus, unspecified: Secondary | ICD-10-CM

## 2021-02-13 DIAGNOSIS — Z888 Allergy status to other drugs, medicaments and biological substances status: Secondary | ICD-10-CM

## 2021-02-13 DIAGNOSIS — I129 Hypertensive chronic kidney disease with stage 1 through stage 4 chronic kidney disease, or unspecified chronic kidney disease: Secondary | ICD-10-CM | POA: Diagnosis present

## 2021-02-13 DIAGNOSIS — I480 Paroxysmal atrial fibrillation: Secondary | ICD-10-CM | POA: Diagnosis present

## 2021-02-13 DIAGNOSIS — E1140 Type 2 diabetes mellitus with diabetic neuropathy, unspecified: Secondary | ICD-10-CM | POA: Diagnosis present

## 2021-02-13 DIAGNOSIS — I10 Essential (primary) hypertension: Secondary | ICD-10-CM | POA: Diagnosis present

## 2021-02-13 DIAGNOSIS — E1169 Type 2 diabetes mellitus with other specified complication: Secondary | ICD-10-CM | POA: Diagnosis present

## 2021-02-13 DIAGNOSIS — N1832 Chronic kidney disease, stage 3b: Secondary | ICD-10-CM | POA: Diagnosis present

## 2021-02-13 DIAGNOSIS — Z993 Dependence on wheelchair: Secondary | ICD-10-CM

## 2021-02-13 DIAGNOSIS — K5651 Intestinal adhesions [bands], with partial obstruction: Principal | ICD-10-CM | POA: Diagnosis present

## 2021-02-13 DIAGNOSIS — M069 Rheumatoid arthritis, unspecified: Secondary | ICD-10-CM | POA: Diagnosis present

## 2021-02-13 DIAGNOSIS — Z90710 Acquired absence of both cervix and uterus: Secondary | ICD-10-CM

## 2021-02-13 DIAGNOSIS — Z79899 Other long term (current) drug therapy: Secondary | ICD-10-CM

## 2021-02-13 DIAGNOSIS — K56600 Partial intestinal obstruction, unspecified as to cause: Secondary | ICD-10-CM

## 2021-02-13 DIAGNOSIS — N184 Chronic kidney disease, stage 4 (severe): Secondary | ICD-10-CM

## 2021-02-13 DIAGNOSIS — E782 Mixed hyperlipidemia: Secondary | ICD-10-CM | POA: Diagnosis present

## 2021-02-13 DIAGNOSIS — H409 Unspecified glaucoma: Secondary | ICD-10-CM | POA: Diagnosis present

## 2021-02-13 DIAGNOSIS — K9189 Other postprocedural complications and disorders of digestive system: Secondary | ICD-10-CM

## 2021-02-13 DIAGNOSIS — Z20822 Contact with and (suspected) exposure to covid-19: Secondary | ICD-10-CM | POA: Diagnosis present

## 2021-02-13 DIAGNOSIS — Z87440 Personal history of urinary (tract) infections: Secondary | ICD-10-CM

## 2021-02-13 DIAGNOSIS — R1032 Left lower quadrant pain: Secondary | ICD-10-CM

## 2021-02-13 DIAGNOSIS — Z8616 Personal history of COVID-19: Secondary | ICD-10-CM

## 2021-02-13 DIAGNOSIS — E876 Hypokalemia: Secondary | ICD-10-CM | POA: Diagnosis not present

## 2021-02-13 DIAGNOSIS — J45909 Unspecified asthma, uncomplicated: Secondary | ICD-10-CM | POA: Diagnosis present

## 2021-02-13 DIAGNOSIS — K219 Gastro-esophageal reflux disease without esophagitis: Secondary | ICD-10-CM | POA: Diagnosis present

## 2021-02-13 DIAGNOSIS — Z7984 Long term (current) use of oral hypoglycemic drugs: Secondary | ICD-10-CM

## 2021-02-13 DIAGNOSIS — Z87891 Personal history of nicotine dependence: Secondary | ICD-10-CM

## 2021-02-13 LAB — COMPREHENSIVE METABOLIC PANEL
ALT: 15 U/L (ref 0–44)
AST: 16 U/L (ref 15–41)
Albumin: 3.4 g/dL — ABNORMAL LOW (ref 3.5–5.0)
Alkaline Phosphatase: 196 U/L — ABNORMAL HIGH (ref 38–126)
Anion gap: 12 (ref 5–15)
BUN: 28 mg/dL — ABNORMAL HIGH (ref 8–23)
CO2: 21 mmol/L — ABNORMAL LOW (ref 22–32)
Calcium: 9.1 mg/dL (ref 8.9–10.3)
Chloride: 104 mmol/L (ref 98–111)
Creatinine, Ser: 1.32 mg/dL — ABNORMAL HIGH (ref 0.44–1.00)
GFR, Estimated: 41 mL/min — ABNORMAL LOW (ref >60)
Glucose, Bld: 144 mg/dL — ABNORMAL HIGH (ref 70–99)
Potassium: 4 mmol/L (ref 3.5–5.1)
Sodium: 137 mmol/L (ref 135–145)
Total Bilirubin: 0.5 mg/dL (ref 0.3–1.2)
Total Protein: 7.5 g/dL (ref 6.5–8.1)

## 2021-02-13 LAB — CBC
HCT: 33.9 % — ABNORMAL LOW (ref 36.0–46.0)
Hemoglobin: 10.8 g/dL — ABNORMAL LOW (ref 12.0–15.0)
MCH: 27.5 pg (ref 26.0–34.0)
MCHC: 31.9 g/dL (ref 30.0–36.0)
MCV: 86.3 fL (ref 80.0–100.0)
Platelets: 296 10*3/uL (ref 150–400)
RBC: 3.93 MIL/uL (ref 3.87–5.11)
RDW: 13.7 % (ref 11.5–15.5)
WBC: 11.2 10*3/uL — ABNORMAL HIGH (ref 4.0–10.5)
nRBC: 0 % (ref 0.0–0.2)

## 2021-02-13 LAB — LIPASE, BLOOD: Lipase: 37 U/L (ref 11–51)

## 2021-02-13 MED ORDER — SODIUM CHLORIDE 0.9 % IV BOLUS
1000.0000 mL | Freq: Once | INTRAVENOUS | Status: AC
  Administered 2021-02-14: 1000 mL via INTRAVENOUS

## 2021-02-13 MED ORDER — FENTANYL CITRATE (PF) 100 MCG/2ML IJ SOLN
50.0000 ug | Freq: Once | INTRAMUSCULAR | Status: AC
Start: 2021-02-13 — End: 2021-02-14
  Administered 2021-02-14: 50 ug via INTRAVENOUS
  Filled 2021-02-13: qty 2

## 2021-02-13 MED ORDER — ONDANSETRON HCL 4 MG/2ML IJ SOLN
4.0000 mg | Freq: Once | INTRAMUSCULAR | Status: AC
  Administered 2021-02-14: 4 mg via INTRAVENOUS
  Filled 2021-02-13: qty 2

## 2021-02-13 NOTE — ED Provider Notes (Signed)
[facility] Emergency Department Provider Note   ____________________________________________   Event Date/Time   First MD Initiated Contact with Patient 02/13/21 2305     (approximate)  I have reviewed the triage vital signs and the nursing notes.   HISTORY  Chief Complaint Abdominal Pain    HPI Stephanie [name] is a 80 y.o. female who presents to the ED from home with a chief complaint of abdominal pain.  Patient reports approximately 8 PM she developed pain to her umbilicus radiating to her left lower quadrant.  Denies fever, cough, chest pain, shortness of breath, nausea, vomiting, diarrhea.  Passing flatus.  Indwelling Foley catheter in place, most recently changed 2 weeks ago.  Has had Foley in place since September when she had sepsis from UTI.     Past Medical History:  Diagnosis Date  . Anemia   . Arthritis   . Asthma   . Cancer ([place]) 1996   colon  . Chronic kidney disease   . Complication of anesthesia    nausea  . Diabetes mellitus without complication ([place])   . GERD (gastroesophageal reflux disease)   . Glaucoma   . Hyperlipidemia   . Hypertension   . Lumbar disc disease   . Small bowel obstruction ([place])   . Vitamin B 12 deficiency     Patient Active Problem List   Diagnosis Date Noted  . CKD (chronic kidney disease) stage 5, GFR less than 15 ml/min (HCC)   . Ileus ([place])   . Paroxysmal A-fib ([place])   . Goals of care, counseling/discussion   . Palliative care by specialist   . SBO (small bowel obstruction) ([place])   . Pressure injury of skin 06/19/2020  . Acquired thrombophilia ([place]) 06/19/2020  . On continuous oral anticoagulation 06/19/2020  . Acute pyelonephritis 06/18/2020  . Hydronephrosis, right 06/18/2020  . Lactic acid acidosis 06/18/2020  . Hyperglycemia 06/18/2020  . Hx of essential hypertension 06/18/2020  . Immunosuppression due to drug therapy ([place]) 06/18/2020  . Atrial fibrillation with rapid ventricular response  ([place])   . Essential hypertension   . Pure hypercholesterolemia   . Diabetes mellitus ([place]) 06/17/2020  . Rheumatoid arthritis ([place]) 06/17/2020  . COVID-19 virus infection 06/17/2020  . AKI (acute kidney injury) ([place]) 06/17/2020  . Chronic left SI joint pain 04/19/2020  . 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[place]) 04/19/2020  . Chronic radicular lumbar pain 04/19/2020  . Lumbar spondylosis 04/19/2020  . Chronic pain syndrome 04/19/2020  . Sepsis ([place]) 07/05/2019  . CAP (community acquired pneumonia) 07/05/2019  . Gastroenteritis 07/05/2019  . 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 Elevated serum creatinine 10/16/2017  . Rheumatoid arthritis involving multiple sites with positive rheumatoid factor ([place]) 08/13/2016  . Hyperlipidemia, mixed 04/18/2016  . Gross hematuria 11/01/2015  . History of colon cancer 10/18/2014  . Squamous cell metaplasia of urinary bladder 04/29/2014  . Osteoarthritis of knee 04/01/2014  . B-complex deficiency 03/03/2014  . Benign essential hypertension 01/16/2014  . Unspecified thoracic, thoracolumbar and lumbosacral intervertebral disc disorder 01/16/2014  . Malignant neoplasm of colon ([place]) 01/16/2014  . Bladder neoplasm of uncertain malignant potential 09/14/2013  . Nocturia 08/05/2013  . Scoliosis (and kyphoscoliosis), idiopathic 06/24/2013  . Chronic cystitis 05/04/2013  . Increased frequency of urination 05/04/2013  . Incomplete emptying of bladder 05/04/2013    Past Surgical History:  Procedure Laterality Date  . ABDOMINAL HYSTERECTOMY    . BACK SURGERY    . COLON SURGERY    .  COLONOSCOPY WITH PROPOFOL N/A 05/22/2017   Procedure: COLONOSCOPY WITH PROPOFOL;  Surgeon: Manya Silvas, MD;  [facility] ENDOSCOPY;  Service: Endoscopy;  Laterality: N/A;  . CYSTOSCOPY WITH STENT PLACEMENT Right 08/02/2020   Procedure: CYSTOSCOPY WITH STENT EXCHANGE;  Surgeon: Abbie Sons, MD;  Location: [facility] ORS;  Service: Urology;   Laterality: Right;  . CYSTOSCOPY WITH STENT PLACEMENT Right 06/18/2020   Procedure: CYSTOSCOPY WITH STENT PLACEMENT;  Surgeon: Irine Seal, MD;  Location: [facility] ORS;  Service: Urology;  Laterality: Right;  . FRACTURE SURGERY    . KNEE ARTHROSCOPY    . LAPAROTOMY N/A 06/30/2020   Procedure: EXPLORATORY LAPAROTOMY;  Surgeon: Jules Husbands, MD;  Location: [facility] ORS;  Service: General;  Laterality: N/A;  . LUMBAR LAMINECTOMY    . TEMPORARY DIALYSIS CATHETER N/A 06/27/2020   Procedure: TEMPORARY DIALYSIS CATHETER;  Surgeon: Katha Cabal, MD;  Location: [place] CV LAB;  Service: Cardiovascular;  Laterality: N/A;    Prior to Admission medications   Medication Sig Start Date End Date Taking? Authorizing Provider  acetaminophen (TYLENOL) 325 MG tablet Take 2 tablets (650 mg total) by mouth every 6 (six) hours as needed for moderate pain, fever or headache. 07/10/20   Ezekiel Slocumb, DO  ascorbic acid (VITAMIN C) 500 MG tablet Take 1 tablet (500 mg total) by mouth daily. 07/10/20   Nicole Kindred A, DO  brimonidine (ALPHAGAN) 0.2 % ophthalmic solution Place 1 drop into both eyes 2 (two) times daily. 05/31/20   [provider]  CONTOUR TEST test strip 1 each by Other route daily.  03/09/18   [provider]  Cranberry 400 MG CAPS Take 400 mg by mouth daily.  03/24/20   [provider]  diltiazem (DILACOR XR) 120 MG 24 hr capsule Take 120 mg by mouth daily.    [provider]  estradiol (ESTRACE) 0.1 MG/GM vaginal cream Place 1 Applicatorful vaginally at bedtime.    [provider]  glipiZIDE (GLUCOTROL) 10 MG tablet Take 10 mg by mouth daily before breakfast.    [provider]  leflunomide (ARAVA) 10 MG tablet Take 10 mg by mouth daily.    [provider]  magnesium oxide (MAG-OX) 400 MG tablet Take by mouth.    [provider]  metFORMIN (GLUCOPHAGE-XR) 500 MG 24 hr tablet Take 500 mg by mouth 2 (two) times daily.  08/24/20   [provider]  metoprolol tartrate (LOPRESSOR) 50 MG tablet Take 50 mg by mouth 2 (two) times daily.    [provider]  omeprazole (PRILOSEC) 40 MG capsule Take 40 mg by mouth daily. 09/11/20   [provider]  rosuvastatin (CRESTOR) 10 MG tablet Take 10 mg by mouth daily.  03/24/20   [provider]  tamsulosin (FLOMAX) 0.4 MG CAPS capsule TAKE 1 CAPSULE(0.4 MG) BY MOUTH DAILY 12/05/20   Ernestine Conrad, Larene Beach A, PA-C  tiZANidine (ZANAFLEX) 4 MG tablet  09/26/20   [provider]  vitamin B-12 (CYANOCOBALAMIN) 1000 MCG tablet Take 1,000 mcg by mouth daily.    [provider]    Allergies Ibuprofen, Indomethacin, Infliximab, Methotrexate derivatives, Moexipril, Percocet [oxycodone-acetaminophen], and Naprosyn [naproxen]  Family History  Problem Relation Age of Onset  . Breast cancer Neg Hx     Social History Social History   Tobacco Use  . Smoking status: Never Smoker  . Smokeless tobacco: Never Used  Vaping Use  . Vaping Use: Never used  Substance Use Topics  . Alcohol use: No  .  Drug use: No    Review of Systems  Constitutional: No fever/chills Eyes: No visual changes. ENT: No sore throat. Cardiovascular: Denies chest pain. Respiratory: Denies shortness of breath. Gastrointestinal: Positive for abdominal pain.  No nausea, no vomiting.  No diarrhea.  No constipation. Genitourinary: Negative for dysuria. Musculoskeletal: Negative for back pain. Skin: Negative for rash. Neurological: Negative for headaches, focal weakness or numbness.   ____________________________________________   PHYSICAL EXAM:  VITAL SIGNS: ED Triage Vitals  Enc Vitals Group     BP 02/13/21 2207 (!) 213/105     Pulse Rate 02/13/21 2206 90     Resp 02/13/21 2206 20     Temp 02/13/21 2206 98.2 F (36.8 C)     Temp Source 02/13/21 2206 Oral     SpO2 02/13/21 2206 97 %     Weight 02/13/21 2207 160 lb (72.6 kg)     Height 02/13/21  2207 5\' 6"  (1.676 m)     Head Circumference --      Peak Flow --      Pain Score 02/13/21 2206 10     Pain Loc --      Pain Edu? --      Excl. in [place]? --     Constitutional: Alert and oriented.  Elderly appearing and in no acute distress. Eyes: Conjunctivae are normal. PERRL. EOMI. Head: Atraumatic. Nose: No congestion/rhinnorhea. Mouth/Throat: Mucous membranes are moist.  Oropharynx non-erythematous. Neck: No stridor.   Cardiovascular: Normal rate, regular rhythm. Grossly normal heart sounds.  Good peripheral circulation. Respiratory: Normal respiratory effort.  No retractions. Lungs CTAB. Gastrointestinal: Soft and moderately tender to palpation left lower quadrant with voluntary guarding, no rebound. No distention. No abdominal bruits. No CVA tenderness. Genitourinary: Indwelling Foley catheter. Musculoskeletal: No lower extremity tenderness nor edema.  No joint effusions. Neurologic:  Normal speech and language. No gross focal neurologic deficits are appreciated. No gait instability. Skin:  Skin is warm, dry and intact. No rash noted. Psychiatric: Mood and affect are normal. Speech and behavior are normal.  ____________________________________________   LABS (all labs ordered are listed, but only abnormal results are displayed)  Labs Reviewed  COMPREHENSIVE METABOLIC PANEL - Abnormal; Notable for the following components:      Result Value   CO2 21 (*)    Glucose, Bld 144 (*)    BUN 28 (*)    Creatinine, Ser 1.32 (*)    Albumin 3.4 (*)    Alkaline Phosphatase 196 (*)    GFR, Estimated 41 (*)    All other components within normal limits  CBC - Abnormal; Notable for the following components:   WBC 11.2 (*)    Hemoglobin 10.8 (*)    HCT 33.9 (*)    All other components within normal limits  URINALYSIS, COMPLETE (UACMP) WITH MICROSCOPIC - Abnormal; Notable for the following components:   Color, Urine YELLOW (*)    APPearance HAZY (*)    Protein, ur 100 (*)     Leukocytes,Ua LARGE (*)    WBC, UA >50 (*)    Bacteria, UA FEW (*)    All other components within normal limits  CBC WITH DIFFERENTIAL/PLATELET - Abnormal; Notable for the following components:   WBC 10.9 (*)    Hemoglobin 10.8 (*)    HCT 33.8 (*)    All other components within normal limits  RESP PANEL BY RT-PCR (FLU A&B, COVID) ARPGX2  CULTURE, BLOOD (ROUTINE X 2)  CULTURE, BLOOD (ROUTINE X 2)  URINE CULTURE  LIPASE,  BLOOD  LACTIC ACID, PLASMA  LACTIC ACID, PLASMA   ____________________________________________  EKG  ED ECG REPORT I, Jeremey Bascom J, the attending physician, personally viewed and interpreted this ECG.   Date: 02/13/2021  EKG Time: 0100  Rate: 64  Rhythm: normal EKG, normal sinus rhythm  Axis: Normal  Intervals:right bundle branch block  ST&T Change: Nonspecific  ____________________________________________  RADIOLOGY I, Jazzma Neidhardt J, personally viewed and evaluated these images (plain radiographs) as part of my medical decision making, as well as reviewing the written report by the radiologist.  ED MD interpretation:  Partial SBO  Official radiology report(s): CT Abdomen Pelvis Wo Contrast  Result Date: 02/14/2021 CLINICAL DATA:  Mid lower abdominal pain and radiates to the left lower quadrant, concern for diverticulitis EXAM: CT ABDOMEN AND PELVIS WITHOUT CONTRAST TECHNIQUE: Multidetector CT imaging of the abdomen and pelvis was performed following the standard protocol without IV contrast. COMPARISON:  June 24, 2020 FINDINGS: Lower chest: Small volume of fluid in the left major fissure. Bibasilar atelectasis. Thoracic aortic atherosclerosis. Normal size heart. No significant pericardial effusion/thickening. Small hiatal hernia. Hepatobiliary: Unremarkable noncontrast appearance of the hepatic parenchyma. Gallbladder is predominantly decompressed otherwise unremarkable. No biliary ductal dilation. Pancreas: Within normal limits. Spleen: Within normal  limits. Adrenals/Urinary Tract: Bilateral adrenal glands are unremarkable. Stable positioning of the right double-J ureteral stent with proximal pigtail in the renal pelvis and distal pigtail in the bladder and similar mild right hydronephrosis. Left kidney is unremarkable without hydronephrosis or renal stones. Urinary bladder is predominantly decompressed around a Foley catheter with mild residual bladder wall thickening likely accentuated by under distension. Stomach/Bowel: Small hiatal hernia. Stomach is mildly distended with ingested contents. Normal positioning of the duodenum/ligament of [place]. There fluid-filled and gaseous lead dilated loops of small bowel throughout the abdomen measuring up to 3.3 cm with transition relatively decompressed small bowel in the central lower abdomen similar location to prior. However there is gas and fluid noted throughout the large bowel with only the sigmoid colon just proximal to the rectosigmoid anastomosis being relatively decompressed however there is gas and stool in the rectum. Vascular/Lymphatic: Extensive aortic and branch vessel atherosclerosis. No pathologically enlarged abdominal or pelvic lymph nodes. Reproductive: Diminutive or absent uterus and ovaries as before. Other: Mesenteric edema without overt ascites. Musculoskeletal: Extensive previous posterior thoracolumbar spinal fusion which extends to the iliac wings and appears stable. No acute osseous abnormality visualized. IMPRESSION: 1. Dilated gas and fluid-filled loops of small bowel throughout the abdomen measuring up to 3.3 cm with transition to relatively decompressed small bowel in the central lower abdomen similar in location to prior study. However there is gas and fluid noted throughout the large bowel with only the sigmoid colon just proximal to the rectosigmoid anastomosis being relatively decompressed but with gas and stool in a minimally distended rectum. Findings are favored to represent a  partial or developing small bowel obstruction. 2. Stable positioning of the right double-J ureteral stent with similar mild right hydronephrosis. Urinary bladder decompressed by Foley catheter with persistent bladder wall thickening. 3. Extensive aortic and branch vessel atherosclerosis. 4. Small hiatal hernia. 5. Aortic atherosclerosis. Aortic Atherosclerosis (ICD10-I70.0). Electronically Signed   By: Dahlia Bailiff MD   On: 02/14/2021 00:11    ____________________________________________   PROCEDURES  Procedure(s) performed (including Critical Care):  .1-3 Lead EKG Interpretation Performed by: Paulette Blanch, MD Authorized by: Paulette Blanch, MD     Interpretation: normal     ECG rate:  90   ECG rate assessment:  normal     Rhythm: sinus rhythm     Ectopy: none     Conduction: normal   Comments:     Patient placed on cardiac monitor to evaluate for arrhythmias   ____________________________________________   INITIAL IMPRESSION / ASSESSMENT AND PLAN / ED COURSE  As part of my medical decision making, I reviewed the following data within the electronic MEDICAL RECORD NUMBER History obtained from family, Nursing notes reviewed and incorporated, Labs reviewed, EKG interpreted, Old chart reviewed, Radiograph reviewed and Notes from prior ED visits     80 year old female presenting with left lower quadrant abdominal pain. Differential diagnosis includes, but is not limited to, ovarian cyst, ovarian torsion, acute appendicitis, diverticulitis, urinary tract infection/pyelonephritis, endometriosis, bowel obstruction, colitis, renal colic, gastroenteritis, hernia, fibroids, endometriosis, etc.  Basic laboratory results unremarkable; improved kidney function from prior.  Will check blood cultures, lactic acid, respiratory panel, UA with culture.  Obtain CT abdomen/pelvis to evaluate etiology of patient's pain.  Note that CT will be without oral or IV contrast given the national CT dye shortage  currently.   Clinical Course as of 02/14/21 0124  Tue Feb 14, 2021  0021 Updated patient and spouse on CT imaging results.  Will discuss with hospitalist services for admission.  Hold NG tube as patient currently denies nausea/vomiting. [JS]  8270 As expected, urine from Foley catheter demonstrates large leukocytes.  Lactic acid negative.  Will hold antibiotics pending urine culture. [JS]    Clinical Course User Index [JS] Paulette Blanch, MD     ____________________________________________   FINAL CLINICAL IMPRESSION(S) / ED DIAGNOSES  Final diagnoses:  Left lower quadrant abdominal pain  Partial small bowel obstruction [facility])     ED Discharge Orders    None      *Please note:  Stephanie [name] was evaluated in Emergency Department on 02/14/2021 for the symptoms described in the history of present illness. She was evaluated in the context of the global COVID-19 pandemic, which necessitated consideration that the patient might be at risk for infection with the SARS-CoV-2 virus that causes COVID-19. Institutional protocols and algorithms that pertain to the evaluation of patients at risk for COVID-19 are in a state of rapid change based on information released by regulatory bodies including the CDC and federal and state organizations. These policies and algorithms were followed during the patient's care in the ED.  Some ED evaluations and interventions may be delayed as a result of limited staffing during and the pandemic.*   Note:  This document was prepared using Dragon voice recognition software and may include unintentional dictation errors.   Paulette Blanch, MD 02/14/21 [phone]

## 2021-02-13 NOTE — ED Triage Notes (Signed)
Pt c/o mid lower abdominal pain starting this evening. Denies fever or NVD.  Pain starts in mid lower abdomen and radiates to LLQ.  Pt reports sepsis from UTI in September.  Unlabored.  BP elevated in triage; discussed with dr Jari Pigg, no orders other than abdominal labs at this time.  Per dr Jari Pigg pt will wait and have foley changed in back to obtain urine specimen.

## 2021-02-14 DIAGNOSIS — R109 Unspecified abdominal pain: Secondary | ICD-10-CM | POA: Diagnosis not present

## 2021-02-14 DIAGNOSIS — K56609 Unspecified intestinal obstruction, unspecified as to partial versus complete obstruction: Secondary | ICD-10-CM | POA: Diagnosis present

## 2021-02-14 DIAGNOSIS — E1169 Type 2 diabetes mellitus with other specified complication: Secondary | ICD-10-CM | POA: Diagnosis present

## 2021-02-14 DIAGNOSIS — E1122 Type 2 diabetes mellitus with diabetic chronic kidney disease: Secondary | ICD-10-CM | POA: Diagnosis present

## 2021-02-14 DIAGNOSIS — I129 Hypertensive chronic kidney disease with stage 1 through stage 4 chronic kidney disease, or unspecified chronic kidney disease: Secondary | ICD-10-CM | POA: Diagnosis present

## 2021-02-14 DIAGNOSIS — E1140 Type 2 diabetes mellitus with diabetic neuropathy, unspecified: Secondary | ICD-10-CM | POA: Diagnosis present

## 2021-02-14 DIAGNOSIS — E782 Mixed hyperlipidemia: Secondary | ICD-10-CM | POA: Diagnosis present

## 2021-02-14 DIAGNOSIS — E876 Hypokalemia: Secondary | ICD-10-CM | POA: Diagnosis not present

## 2021-02-14 DIAGNOSIS — N1832 Chronic kidney disease, stage 3b: Secondary | ICD-10-CM

## 2021-02-14 DIAGNOSIS — R112 Nausea with vomiting, unspecified: Secondary | ICD-10-CM | POA: Diagnosis not present

## 2021-02-14 DIAGNOSIS — H409 Unspecified glaucoma: Secondary | ICD-10-CM | POA: Diagnosis present

## 2021-02-14 DIAGNOSIS — I480 Paroxysmal atrial fibrillation: Secondary | ICD-10-CM | POA: Diagnosis present

## 2021-02-14 DIAGNOSIS — Z20822 Contact with and (suspected) exposure to covid-19: Secondary | ICD-10-CM | POA: Diagnosis present

## 2021-02-14 DIAGNOSIS — Z87891 Personal history of nicotine dependence: Secondary | ICD-10-CM | POA: Diagnosis not present

## 2021-02-14 DIAGNOSIS — I10 Essential (primary) hypertension: Secondary | ICD-10-CM | POA: Diagnosis not present

## 2021-02-14 DIAGNOSIS — Z79899 Other long term (current) drug therapy: Secondary | ICD-10-CM | POA: Diagnosis not present

## 2021-02-14 DIAGNOSIS — Z8616 Personal history of COVID-19: Secondary | ICD-10-CM | POA: Diagnosis not present

## 2021-02-14 DIAGNOSIS — K219 Gastro-esophageal reflux disease without esophagitis: Secondary | ICD-10-CM | POA: Diagnosis present

## 2021-02-14 DIAGNOSIS — Z85038 Personal history of other malignant neoplasm of large intestine: Secondary | ICD-10-CM | POA: Diagnosis not present

## 2021-02-14 DIAGNOSIS — Z79890 Hormone replacement therapy (postmenopausal): Secondary | ICD-10-CM | POA: Diagnosis not present

## 2021-02-14 DIAGNOSIS — Z7984 Long term (current) use of oral hypoglycemic drugs: Secondary | ICD-10-CM | POA: Diagnosis not present

## 2021-02-14 DIAGNOSIS — R933 Abnormal findings on diagnostic imaging of other parts of digestive tract: Secondary | ICD-10-CM

## 2021-02-14 DIAGNOSIS — K5651 Intestinal adhesions [bands], with partial obstruction: Secondary | ICD-10-CM | POA: Diagnosis present

## 2021-02-14 DIAGNOSIS — Z981 Arthrodesis status: Secondary | ICD-10-CM | POA: Diagnosis not present

## 2021-02-14 DIAGNOSIS — J45909 Unspecified asthma, uncomplicated: Secondary | ICD-10-CM | POA: Diagnosis present

## 2021-02-14 DIAGNOSIS — N184 Chronic kidney disease, stage 4 (severe): Secondary | ICD-10-CM

## 2021-02-14 DIAGNOSIS — Z9289 Personal history of other medical treatment: Secondary | ICD-10-CM

## 2021-02-14 DIAGNOSIS — Z993 Dependence on wheelchair: Secondary | ICD-10-CM | POA: Diagnosis not present

## 2021-02-14 DIAGNOSIS — Z87440 Personal history of urinary (tract) infections: Secondary | ICD-10-CM | POA: Diagnosis not present

## 2021-02-14 DIAGNOSIS — M069 Rheumatoid arthritis, unspecified: Secondary | ICD-10-CM | POA: Diagnosis present

## 2021-02-14 LAB — LACTIC ACID, PLASMA
Lactic Acid, Venous: 1.7 mmol/L (ref 0.5–1.9)
Lactic Acid, Venous: 1.4 mmol/L (ref 0.5–1.9)

## 2021-02-14 LAB — CBC
HCT: 33.1 % — ABNORMAL LOW (ref 36.0–46.0)
Hemoglobin: 10.6 g/dL — ABNORMAL LOW (ref 12.0–15.0)
MCH: 27.2 pg (ref 26.0–34.0)
MCHC: 32 g/dL (ref 30.0–36.0)
MCV: 84.9 fL (ref 80.0–100.0)
Platelets: 292 10*3/uL (ref 150–400)
RBC: 3.9 MIL/uL (ref 3.87–5.11)
RDW: 13.8 % (ref 11.5–15.5)
WBC: 11.4 10*3/uL — ABNORMAL HIGH (ref 4.0–10.5)
nRBC: 0 % (ref 0.0–0.2)

## 2021-02-14 LAB — BASIC METABOLIC PANEL
Anion gap: 7 (ref 5–15)
BUN: 22 mg/dL (ref 8–23)
CO2: 25 mmol/L (ref 22–32)
Calcium: 8.7 mg/dL — ABNORMAL LOW (ref 8.9–10.3)
Chloride: 107 mmol/L (ref 98–111)
Creatinine, Ser: 1.06 mg/dL — ABNORMAL HIGH (ref 0.44–1.00)
GFR, Estimated: 54 mL/min — ABNORMAL LOW (ref >60)
Glucose, Bld: 161 mg/dL — ABNORMAL HIGH (ref 70–99)
Potassium: 4.1 mmol/L (ref 3.5–5.1)
Sodium: 139 mmol/L (ref 135–145)

## 2021-02-14 LAB — URINALYSIS, COMPLETE (UACMP) WITH MICROSCOPIC
Bilirubin Urine: NEGATIVE
Glucose, UA: NEGATIVE mg/dL
Hgb urine dipstick: NEGATIVE
Ketones, ur: NEGATIVE mg/dL
Nitrite: NEGATIVE
Protein, ur: 100 mg/dL — AB
Specific Gravity, Urine: 1.008 (ref 1.005–1.030)
WBC, UA: >50 WBC/hpf — ABNORMAL HIGH (ref 0–5)
pH: 6 (ref 5.0–8.0)

## 2021-02-14 LAB — GLUCOSE, CAPILLARY
Glucose-Capillary: 115 mg/dL — ABNORMAL HIGH (ref 70–99)
Glucose-Capillary: 128 mg/dL — ABNORMAL HIGH (ref 70–99)
Glucose-Capillary: 140 mg/dL — ABNORMAL HIGH (ref 70–99)
Glucose-Capillary: 170 mg/dL — ABNORMAL HIGH (ref 70–99)
Glucose-Capillary: 195 mg/dL — ABNORMAL HIGH (ref 70–99)

## 2021-02-14 LAB — CBC WITH DIFFERENTIAL/PLATELET
Abs Immature Granulocytes: 0.04 K/uL (ref 0.00–0.07)
Basophils Absolute: 0.1 K/uL (ref 0.0–0.1)
Basophils Relative: 1 %
Eosinophils Absolute: 0.3 K/uL (ref 0.0–0.5)
Eosinophils Relative: 3 %
HCT: 33.8 % — ABNORMAL LOW (ref 36.0–46.0)
Hemoglobin: 10.8 g/dL — ABNORMAL LOW (ref 12.0–15.0)
Immature Granulocytes: 0 %
Lymphocytes Relative: 22 %
Lymphs Abs: 2.4 K/uL (ref 0.7–4.0)
MCH: 27.6 pg (ref 26.0–34.0)
MCHC: 32 g/dL (ref 30.0–36.0)
MCV: 86.4 fL (ref 80.0–100.0)
Monocytes Absolute: 0.9 K/uL (ref 0.1–1.0)
Monocytes Relative: 8 %
Neutro Abs: 7.2 K/uL (ref 1.7–7.7)
Neutrophils Relative %: 66 %
Platelets: 304 K/uL (ref 150–400)
RBC: 3.91 MIL/uL (ref 3.87–5.11)
RDW: 13.8 % (ref 11.5–15.5)
WBC: 10.9 K/uL — ABNORMAL HIGH (ref 4.0–10.5)
nRBC: 0 % (ref 0.0–0.2)

## 2021-02-14 LAB — RESP PANEL BY RT-PCR (FLU A&B, COVID) ARPGX2
Influenza A by PCR: NEGATIVE
Influenza B by PCR: NEGATIVE
SARS Coronavirus 2 by RT PCR: NEGATIVE

## 2021-02-14 LAB — HEMOGLOBIN A1C
Hgb A1c MFr Bld: 7.7 % — ABNORMAL HIGH (ref 4.8–5.6)
Mean Plasma Glucose: 174.29 mg/dL

## 2021-02-14 MED ORDER — ONDANSETRON HCL 4 MG PO TABS: 4.0000 mg | ORAL_TABLET | Freq: Four times a day (QID) | ORAL | Status: DC | PRN

## 2021-02-14 MED ORDER — CLONIDINE HCL 0.1 MG PO TABS
0.1000 mg | ORAL_TABLET | Freq: Every day | ORAL | Status: DC | PRN
  Administered 2021-02-14 – 2021-02-16 (×2): 0.1 mg via ORAL
  Filled 2021-02-14 (×3): qty 1

## 2021-02-14 MED ORDER — MORPHINE SULFATE (PF) 2 MG/ML IV SOLN: 2.0000 mg | INTRAVENOUS | Status: DC | PRN

## 2021-02-14 MED ORDER — CHLORHEXIDINE GLUCONATE CLOTH 2 % EX PADS
6.0000 | MEDICATED_PAD | Freq: Every day | CUTANEOUS | Status: DC
  Administered 2021-02-14 – 2021-02-15 (×2): 6 via TOPICAL

## 2021-02-14 MED ORDER — ENOXAPARIN SODIUM 40 MG/0.4ML IJ SOSY
40.0000 mg | PREFILLED_SYRINGE | INTRAMUSCULAR | Status: DC
  Administered 2021-02-14 – 2021-02-16 (×3): 40 mg via SUBCUTANEOUS
  Filled 2021-02-14 (×3): qty 0.4

## 2021-02-14 MED ORDER — METOPROLOL TARTRATE 50 MG PO TABS
50.0000 mg | ORAL_TABLET | Freq: Two times a day (BID) | ORAL | Status: DC
  Administered 2021-02-14 – 2021-02-16 (×5): 50 mg via ORAL
  Filled 2021-02-14 (×5): qty 1

## 2021-02-14 MED ORDER — LACTATED RINGERS IV SOLN: INTRAVENOUS | Status: DC

## 2021-02-14 MED ORDER — INSULIN ASPART 100 UNIT/ML IJ SOLN
0.0000 [IU] | INTRAMUSCULAR | Status: DC
  Administered 2021-02-14: 2 [IU] via SUBCUTANEOUS
  Administered 2021-02-14: 1 [IU] via SUBCUTANEOUS
  Administered 2021-02-14 – 2021-02-15 (×3): 2 [IU] via SUBCUTANEOUS
  Administered 2021-02-15: 1 [IU] via SUBCUTANEOUS
  Administered 2021-02-15 (×2): 2 [IU] via SUBCUTANEOUS
  Administered 2021-02-16 (×2): 1 [IU] via SUBCUTANEOUS
  Filled 2021-02-14 (×10): qty 1

## 2021-02-14 MED ORDER — ONDANSETRON HCL 4 MG/2ML IJ SOLN
4.0000 mg | Freq: Four times a day (QID) | INTRAMUSCULAR | Status: DC | PRN
  Administered 2021-02-15: 4 mg via INTRAVENOUS
  Filled 2021-02-14: qty 2

## 2021-02-14 NOTE — Consult Note (Addendum)
[facility] SURGICAL CONSULTATION NOTE (initial) - cpt: 35329   HISTORY OF PRESENT ILLNESS (HPI):  80 y.o. female presented to [facility] ED overnight for evaluation of abdominal pain. Patient reports the acute onset of abdominal pain around 2000 last night. This was localized near her umbilicus and radiated to her left abdomen. She endorses nausea and two episodes of emesis with her abdominal pain. No fever, chills, cough, CP, SOB.She did report passing flatus but no bowel movements. She has a history of small bowel obstruction in the past. Most recently in September 2021 which required laparotomy and lysis of adhesions with Dr Dahlia Byes. Work up in the ED revealed a very mild leukocytosis to 11.2K, scr improving from previous at 1.32, and labs were otherwise reassuring. She did also have CT Abdomen/Pelvis which was concerning for possible partial vs developing SBO. She was admitted to medicine service. Ultimately, she had begun feeling better and NGT placement was forgone.   This morning, she reports that she is feeling much better this morning. Still feeling distended but no pain, nausea, or emesis. No reports of flatus this morning.   Surgery is consulted by hospitalist physician Dr. Judd Gaudier, MD in this context for evaluation and management of pSBO.   PAST MEDICAL HISTORY (PMH):  Past Medical History:  Diagnosis Date  . Anemia   . Arthritis   . Asthma   . Cancer ([place]) 1996   colon  . Chronic kidney disease   . Complication of anesthesia    nausea  . Diabetes mellitus without complication ([place])   . GERD (gastroesophageal reflux disease)   . Glaucoma   . Hyperlipidemia   . Hypertension   . Lumbar disc disease   . Small bowel obstruction ([place])   . Vitamin B 12 deficiency      PAST SURGICAL HISTORY ([place]):  Past Surgical History:  Procedure Laterality Date  . ABDOMINAL HYSTERECTOMY    . BACK SURGERY    . COLON SURGERY    . COLONOSCOPY WITH PROPOFOL N/A 05/22/2017    Procedure: COLONOSCOPY WITH PROPOFOL;  Surgeon: Manya Silvas, MD;  Location: [facility] ENDOSCOPY;  Service: Endoscopy;  Laterality: N/A;  . CYSTOSCOPY WITH STENT PLACEMENT Right 08/02/2020   Procedure: CYSTOSCOPY WITH STENT EXCHANGE;  Surgeon: Abbie Sons, MD;  Location: [facility] ORS;  Service: Urology;  Laterality: Right;  . CYSTOSCOPY WITH STENT PLACEMENT Right 06/18/2020   Procedure: CYSTOSCOPY WITH STENT PLACEMENT;  Surgeon: Irine Seal, MD;  Location: [facility] ORS;  Service: Urology;  Laterality: Right;  . FRACTURE SURGERY    . KNEE ARTHROSCOPY    . LAPAROTOMY N/A 06/30/2020   Procedure: EXPLORATORY LAPAROTOMY;  Surgeon: Jules Husbands, MD;  Location: [facility] ORS;  Service: General;  Laterality: N/A;  . LUMBAR LAMINECTOMY    . TEMPORARY DIALYSIS CATHETER N/A 06/27/2020   Procedure: TEMPORARY DIALYSIS CATHETER;  Surgeon: Katha Cabal, MD;  Location: [facility];  Service: Cardiovascular;  Laterality: N/A;     MEDICATIONS:  Prior to Admission medications   Medication Sig Start Date End Date Taking? Authorizing Provider  acetaminophen (TYLENOL) 325 MG tablet Take 2 tablets (650 mg total) by mouth every 6 (six) hours as needed for moderate pain, fever or headache. 07/10/20   Ezekiel Slocumb, DO  ascorbic acid (VITAMIN C) 500 MG tablet Take 1 tablet (500 mg total) by mouth daily. 07/10/20   Nicole Kindred A, DO  brimonidine (ALPHAGAN) 0.2 % ophthalmic solution Place 1 drop into both eyes 2 (two)  times daily. 05/31/20   [provider]  CONTOUR TEST test strip 1 each by Other route daily.  03/09/18   [provider]  Cranberry 400 MG CAPS Take 400 mg by mouth daily.  03/24/20   [provider]  diltiazem (DILACOR XR) 120 MG 24 hr capsule Take 120 mg by mouth daily.    [provider]  estradiol (ESTRACE) 0.1 MG/GM vaginal cream Place 1 Applicatorful vaginally at bedtime.    [provider]  glipiZIDE (GLUCOTROL) 10 MG tablet Take 10 mg by mouth  daily before breakfast.    [provider]  leflunomide (ARAVA) 10 MG tablet Take 10 mg by mouth daily.    [provider]  magnesium oxide (MAG-OX) 400 MG tablet Take by mouth.    [provider]  metFORMIN (GLUCOPHAGE-XR) 500 MG 24 hr tablet Take 500 mg by mouth 2 (two) times daily. 08/24/20   [provider]  metoprolol tartrate (LOPRESSOR) 50 MG tablet Take 50 mg by mouth 2 (two) times daily.    [provider]  omeprazole (PRILOSEC) 40 MG capsule Take 40 mg by mouth daily. 09/11/20   [provider]  rosuvastatin (CRESTOR) 10 MG tablet Take 10 mg by mouth daily.  03/24/20   [provider]  tamsulosin (FLOMAX) 0.4 MG CAPS capsule TAKE 1 CAPSULE(0.4 MG) BY MOUTH DAILY 12/05/20   Ernestine Conrad, Larene Beach A, PA-C  tiZANidine (ZANAFLEX) 4 MG tablet  09/26/20   [provider]  vitamin B-12 (CYANOCOBALAMIN) 1000 MCG tablet Take 1,000 mcg by mouth daily.    [provider]     ALLERGIES:  Allergies  Allergen Reactions  . Ibuprofen Itching  . Indomethacin   . Infliximab   . Methotrexate Derivatives   . Moexipril   . Percocet [Oxycodone-Acetaminophen] Itching  . Naprosyn [Naproxen] Rash     SOCIAL HISTORY:  Social History   Socioeconomic History  . Marital status: Married    Spouse name: Not on file  . Number of children: Not on file  . Years of education: Not on file  . Highest education level: Not on file  Occupational History  . Not on file  Tobacco Use  . Smoking status: Never Smoker  . Smokeless tobacco: Never Used  Vaping Use  . Vaping Use: Never used  Substance and Sexual Activity  . Alcohol use: No  . Drug use: No  . Sexual activity: Yes  Other Topics Concern  . Not on file  Social History Narrative  . Not on file   Social Determinants of Health   Financial Resource Strain: Not on file  Food Insecurity: Not on file  Transportation Needs: Not on file  Physical Activity: Not on file   Stress: Not on file  Social Connections: Not on file  Intimate Partner Violence: Not on file     FAMILY HISTORY:  Family History  Problem Relation Age of Onset  . Breast cancer Neg Hx       REVIEW OF SYSTEMS:  Review of Systems  Constitutional: Negative for chills and fever.  Respiratory: Negative for cough and shortness of breath.   Cardiovascular: Negative for chest pain and palpitations.  Gastrointestinal: Positive for abdominal pain (Resolved), nausea (Resolved) and vomiting (Resolved). Negative for constipation and diarrhea.  Genitourinary: Negative for dysuria and urgency.  Neurological: Negative for dizziness and headaches.  All other systems reviewed and are negative.   VITAL SIGNS:  Temp:  [97.8 F (36.6 C)-98.2 F (36.8 C)] 97.8 F (  36.6 C) (05/17 0421) Pulse Rate:  [61-90] 64 (05/17 0421) Resp:  [13-20] 16 (05/17 0421) BP: (175-213)/(66-105) 180/72 (05/17 0421) SpO2:  [97 %-100 %] 99 % (05/17 0421) Weight:  [71.1 kg-72.6 kg] 71.1 kg (05/17 0231)     Height: 5\' 6"  (167.6 cm) Weight: 71.1 kg BMI (Calculated): 25.31   INTAKE/OUTPUT:  05/16 0701 - 05/17 0700 In: 202.5 [I.V.:202.5] Out: 700 [Urine:700]  PHYSICAL EXAM:  Physical Exam Vitals and nursing note reviewed. Exam conducted with a chaperone present.  Constitutional:      General: She is not in acute distress.    Appearance: She is well-developed and normal weight. She is not ill-appearing.  HENT:     Head: Normocephalic and atraumatic.     Mouth/Throat:     Mouth: Mucous membranes are moist.     Pharynx: Oropharynx is clear.  Eyes:     General: No scleral icterus.    Extraocular Movements: Extraocular movements intact.  Cardiovascular:     Rate and Rhythm: Normal rate.     Heart sounds: Normal heart sounds.  Pulmonary:     Effort: Pulmonary effort is normal. No respiratory distress.  Abdominal:     General: A surgical scar is present. There is distension.     Palpations: Abdomen is soft.      Tenderness: There is no abdominal tenderness. There is no guarding or rebound.     Comments: Abdomen is soft, non-tender, she is distended and tympanic throughout, no rebound/guarding, certainly without peritonitis   Genitourinary:    Comments: Deferred Skin:    General: Skin is warm and dry.     Coloration: Skin is not jaundiced or pale.  Neurological:     General: No focal deficit present.     Mental Status: She is alert and oriented to person, place, and time.  Psychiatric:        Mood and Affect: Mood normal.        Behavior: Behavior normal.      Labs:  CBC Latest Ref Rng & Units 02/14/2021 02/13/2021 02/13/2021  WBC 4.0 - 10.5 K/uL 11.4(H) 10.9(H) 11.2(H)  Hemoglobin 12.0 - 15.0 g/dL 10.6(L) 10.8(L) 10.8(L)  Hematocrit 36.0 - 46.0 % 33.1(L) 33.8(L) 33.9(L)  Platelets 150 - 400 K/uL 292 304 296   CMP Latest Ref Rng & Units 02/14/2021 02/13/2021 07/10/2020  Glucose 70 - 99 mg/dL 161(H) 144(H) 144(H)  BUN 8 - 23 mg/dL 22 28(H) 60(H)  Creatinine 0.44 - 1.00 mg/dL 1.06(H) 1.32(H) 1.94(H)  Sodium 135 - 145 mmol/L 139 137 138  Potassium 3.5 - 5.1 mmol/L 4.1 4.0 3.3(L)  Chloride 98 - 111 mmol/L 107 104 101  CO2 22 - 32 mmol/L 25 21(L) 26  Calcium 8.9 - 10.3 mg/dL 8.7(L) 9.1 8.0(L)  Total Protein 6.5 - 8.1 g/dL - 7.5 5.4(L)  Total Bilirubin 0.3 - 1.2 mg/dL - 0.5 0.5  Alkaline Phos 38 - 126 U/L - 196(H) 617(H)  AST 15 - 41 U/L - 16 37  ALT 0 - 44 U/L - 15 49(H)     Imaging studies:   CT Abdomen/Pelvis (02/13/2021) personally reviewed which shows dilated loops of small bowel concerning for possible partial small bowel obstruction, and radiologist report reviewed: IMPRESSION: 1. Dilated gas and fluid-filled loops of small bowel throughout the abdomen measuring up to 3.3 cm with transition to relatively decompressed small bowel in the central lower abdomen similar in location to prior study. However there is gas and fluid noted throughout the large bowel  with only the sigmoid  colon just proximal to the rectosigmoid anastomosis being relatively decompressed but with gas and stool in a minimally distended rectum. Findings are favored to represent a partial or developing small bowel obstruction. 2. Stable positioning of the right double-J ureteral stent with similar mild right hydronephrosis. Urinary bladder decompressed by Foley catheter with persistent bladder wall thickening. 3. Extensive aortic and branch vessel atherosclerosis. 4. Small hiatal hernia. 5. Aortic atherosclerosis.   Assessment/Plan: (ICD-10's: K34.609) 80 y.o. 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   - Agree we may be able to forgo NGT placement for now as her pain/nausea/emesis have resolved. She understands that if she develops these, I will recommend placement  - I do think she should remain NPO for now given distension and lack of flatus this morning.    - No emergent surgical intervention  - Monitor abdominal examination; on-going bowel function  - Pain control prn (minimize narcotics); antiemetics prn  - Serial KUBs as needed   - Mobilization encouraged   - Further management per primary service; we will follow   All of the above findings and recommendations were discussed with the patient, and all of patient's questions were answered to her expressed satisfaction.  Thank you for the opportunity to participate in this patient's care.   -- Edison Simon, PA-C [facility] 02/14/2021, 7:07 AM [phone] M-F: 7am - 4pm

## 2021-02-14 NOTE — Hospital Course (Signed)
34 white female home dwelling wheelchair-bound lady Rheumatoid arthritis/Orencia currently (Dr. 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 Juleen China Former smoker Klebsiella septicemia double-J stent 7/93/9688 complicated by ATN during hospital stay-dialyzed at that admission for ATN-this was stopped on discharge Baseline creatinine 1.9 A. fib CHADS2 score >5 (no anticoagulation 2/2 bleed)  Chronic indwelling Foley exchange around 01/31/2021 Prior SBO + laparotomy 06/20/2020 Dr. Dahlia Byes   Presented to [facility] ED mid to lower quadrant LLQ pain Found to have SBO-dilated fluid loops of small bowel up to 3 cm with rectosigmoid pain relatively decompressed-P SBO VS SBO-stable RR double-J stent + mild R hydro  BUNs/creatinine 28/1.3-->22/1.06 Lactic acid 1.7-->1.4 WBC 10.9-->11.4 Platelet [id number]

## 2021-02-14 NOTE — Progress Notes (Signed)
  Agree with plan of care documented by Dr. Damita Dunnings this morning when patient was admitted  52 white female home dwelling walker using/wheelchair-bound lady Rheumatoid arthritis/Orencia currently (Dr. 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 Juleen China Former smoker Klebsiella septicemia double-J stent 3/81/7711 complicated by ATN during hospital stay-dialyzed at that admission for ATN-this was stopped on discharge Baseline creatinine 1.9 A. fib CHADS2 score >5 (no anticoagulation 2/2 bleed)  Chronic indwelling Foley exchange around 01/31/2021 Prior SBO + laparotomy 06/20/2020 Dr. Dahlia Byes   Presented to [facility] ED mid to lower quadrant LLQ pain Found to have SBO-dilated fluid loops of small bowel up to 3 cm with rectosigmoid pain relatively decompressed-P SBO VS SBO-stable RR double-J stent + mild R hydro  BUNs/creatinine 28/1.3-->22/1.06 Lactic acid 1.7-->1.4 WBC 10.9-->11.4 Platelet [id number]   On exam Slightly sleepy but awakens readily not disoriented Passed gas and a small stool this morning  EOMI NCAT no focal deficit Slight abdominal distention S1-S2 no murmur-sinus on monitors No lower extremity edema    P General surgery has cleared the patient for clear liquid diet Resume all meds that are essential Adding clonidine 0.1 for blood pressure above 180 Cycle chemistries in a.m. Keep on monitors given paroxysmal A. Fib  I have updated her husband  Verneita Griffes, MD [facility] 3:38 PM

## 2021-02-14 NOTE — TOC Initial Note (Signed)
Transition of Care [facility]) - Initial/Assessment Note    Patient Details  Name: Stephanie [name] MRN: [id number] Date of Birth: [birth year]  [facility] CM/SW Contact:    Candie Chroman, LCSW Phone Number: 02/14/2021, 1:29 PM  Clinical Narrative: Readmission prevention screen complete. Patient asleep. Husband at bedside. CSW introduced role and explained that discharge planning would be discussed. Patient lives home with her husband. PCP is [name], MD at [facility]. Her husband drives her to appointments. Patient has not driven in 3-4 years. Pharmacy is [facility] in [place]. No issues obtaining medications. No home health prior to admission. Patient uses a rolling walker at home. She has a other DME at home (bedside commode/shower chair) but does not use it. No further concerns. CSW encouraged patient's husband to contact CSW as needed. CSW will continue to follow patient for support and facilitate return home when stable.                Expected Discharge Plan: Home/Self Care Barriers to Discharge: Continued Medical Work up   Patient Goals and CMS Choice Patient states their goals for this hospitalization and ongoing recovery are:: Patient asleep      Expected Discharge Plan and Services Expected Discharge Plan: Home/Self Care       Living arrangements for the past 2 months: Single Family Home                                      Prior Living Arrangements/Services Living arrangements for the past 2 months: Single Family Home Lives with:: Spouse Patient language and need for interpreter reviewed:: Yes Do you feel safe going back to the place where you live?: Yes      Need for Family Participation in Patient Care: Yes (Comment) Care giver support system in place?: Yes (comment) Current home services: DME Criminal Activity/Legal Involvement Pertinent to Current Situation/Hospitalization: No - Comment as needed  Activities of Daily Living Home  Assistive Devices/Equipment: Walker (specify type) ADL Screening (condition at time of admission) Patient's cognitive ability adequate to safely complete daily activities?: Yes Is the patient deaf or have difficulty hearing?: No Does the patient have difficulty seeing, even when wearing glasses/contacts?: No Does the patient have difficulty concentrating, remembering, or making decisions?: No Patient able to express need for assistance with ADLs?: Yes Does the patient have difficulty dressing or bathing?: No Independently performs ADLs?: Yes (appropriate for developmental age) Does the patient have difficulty walking or climbing stairs?: Yes Weakness of Legs: None Weakness of Arms/Hands: None  Permission Sought/Granted Permission sought to share information with : Family Supports    Share Information with NAME: Berna Gitto     Permission granted to share info w Relationship: Spouse  Permission granted to share info w Contact Information: [phone]  Emotional Assessment Appearance:: Appears stated age Attitude/Demeanor/Rapport: Unable to Assess Affect (typically observed): Unable to Assess Orientation: : Oriented to Self,Oriented to Place,Oriented to  Time,Oriented to Situation Alcohol / Substance Use: Not Applicable Psych Involvement: No (comment)  Admission diagnosis:  SBO (small bowel obstruction) (HCC) [K56.609] Partial small bowel obstruction ([place]) [K56.600] Left lower quadrant abdominal pain [R10.32] Patient Active Problem List   Diagnosis Date Noted  . Stage 3b chronic kidney disease ([place]) 02/14/2021  . CKD (chronic kidney disease) stage 5, GFR less than 15 ml/min (HCC)   . Ileus ([place])   . Paroxysmal A-fib ([place])   .  Goals of care, counseling/discussion   . Palliative care by specialist   . SBO (small bowel obstruction) ([place])   . Pressure injury of skin 06/19/2020  . Acquired thrombophilia ([place]) 06/19/2020  . On continuous oral anticoagulation 06/19/2020  . Acute  pyelonephritis 06/18/2020  . Hydronephrosis, right 06/18/2020  . Lactic acid acidosis 06/18/2020  . Hyperglycemia 06/18/2020  . Hx of essential hypertension 06/18/2020  . Immunosuppression due to drug therapy ([place]) 06/18/2020  . Atrial fibrillation with rapid ventricular response ([place])   . Essential hypertension   . Pure hypercholesterolemia   . Diabetes mellitus ([place]) 06/17/2020  . Rheumatoid arthritis ([place]) 06/17/2020  . COVID-19 virus infection 06/17/2020  . AKI (acute kidney injury) ([place]) 06/17/2020  . Chronic left SI joint pain 04/19/2020  . 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[place]) 04/19/2020  . Chronic radicular lumbar pain 04/19/2020  . Lumbar spondylosis 04/19/2020  . Chronic pain syndrome 04/19/2020  . Sepsis ([place]) 07/05/2019  . CAP (community acquired pneumonia) 07/05/2019  . Gastroenteritis 07/05/2019  . 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 Elevated serum creatinine 10/16/2017  . Rheumatoid arthritis involving multiple sites with positive rheumatoid factor ([place]) 08/13/2016  . Hyperlipidemia, mixed 04/18/2016  . Gross hematuria 11/01/2015  . History of colon cancer 10/18/2014  . Squamous cell metaplasia of urinary bladder 04/29/2014  . Osteoarthritis of knee 04/01/2014  . B-complex deficiency 03/03/2014  . Benign essential hypertension 01/16/2014  . Unspecified thoracic, thoracolumbar and lumbosacral intervertebral disc disorder 01/16/2014  . Malignant neoplasm of colon ([place]) 01/16/2014  . Bladder neoplasm of uncertain malignant potential 09/14/2013  . Nocturia 08/05/2013  . Scoliosis (and kyphoscoliosis), idiopathic 06/24/2013  . Chronic cystitis 05/04/2013  . Increased frequency of urination 05/04/2013  . Incomplete emptying of bladder 05/04/2013   PCP:  Rusty Aus, MD Pharmacy:   [facility] [place] [place] [address] Phone:  [phone] Fax: [phone]     Social Determinants of Health (SDOH) Interventions    Readmission Risk Interventions Readmission Risk Prevention Plan 02/14/2021 06/21/2020 06/19/2020  Transportation Screening Complete Complete Complete  PCP or Specialist Appt within 3-5 Days Complete Complete -  HRI or [place] - Complete -  Social Work Consult for Recovery Care Planning/Counseling Complete Complete -  Palliative Care Screening Not Applicable Not Applicable -  Medication Review Press photographer) Complete (No Data) Complete  PCP or Specialist appointment within 3-5 days of discharge - - Complete  HRI or [place] - - Complete  SW Recovery Care/Counseling Consult - - Complete  [place] - - Not Applicable  Some recent data might be hidden

## 2021-02-14 NOTE — H&P (Signed)
History and Physical    Stephanie [name] FBP:[id number] DOB: Mar 28, 1941 DOA: 02/13/2021  PCP: Rusty Aus, MD   Patient coming from: Home  I have personally briefly reviewed patient's old medical records in [place]  Chief Complaint: Abdominal pain  HPI: Stephanie [name] is a 80 y.o. female with medical history significant for Type 2 diabetes, rheumatoid arthritis on chronic immunosuppressive therapy, HTN, remote history of colon cancer, atrial fibrillation not on anticoagulation, chronic indwelling Foley exchanged 2 weeks prior, as well as history of small bowel obstruction with laparotomy in 06/2020 who presents to the ED with a complaint of abdominal pain in the periumbilical  region radiating to the left upper quadrant and left lower quadrant that started around dinnertime.  She had 2 episodes of emesis on arrival to the ED which were nonbloody and nonbilious.  She has been passing flatus but has had no stool.  She denies fever or chills, cough or chest pain. ED course: On arrival BP 213/105, pulse 90, O2 sat 97% on room air and afebrile.  WBC 11,000 with hemoglobin 10.8, creatinine 1.32 which is her baseline for CKD 3B.  Urinalysis showed large leukocyte esterase. EKG, personally viewed and interpreted NSR at 64 with right bundle branch block and nonspecific ST-T wave changes Imaging: CT abdomen and pelvis without contrast: Findings favored to represent a partial or developing small bowel obstruction.  Please refer to detailed report  NG tube was held as patient stopped vomiting and appeared comfortable.  Hospitalist consulted for admission.  Review of Systems: As per HPI otherwise all other systems on review of systems negative.    Past Medical History:  Diagnosis Date  . Anemia   . Arthritis   . Asthma   . Cancer ([place]) 1996   colon  . Chronic kidney disease   . Complication of anesthesia    nausea  . Diabetes mellitus without complication ([place])   . GERD (gastroesophageal  reflux disease)   . Glaucoma   . Hyperlipidemia   . Hypertension   . Lumbar disc disease   . Small bowel obstruction ([place])   . Vitamin B 12 deficiency     Past Surgical History:  Procedure Laterality Date  . ABDOMINAL HYSTERECTOMY    . BACK SURGERY    . COLON SURGERY    . COLONOSCOPY WITH PROPOFOL N/A 05/22/2017   Procedure: COLONOSCOPY WITH PROPOFOL;  Surgeon: Manya Silvas, MD;  Location: [facility] ENDOSCOPY;  Service: Endoscopy;  Laterality: N/A;  . CYSTOSCOPY WITH STENT PLACEMENT Right 08/02/2020   Procedure: CYSTOSCOPY WITH STENT EXCHANGE;  Surgeon: Abbie Sons, MD;  Location: [facility] ORS;  Service: Urology;  Laterality: Right;  . CYSTOSCOPY WITH STENT PLACEMENT Right 06/18/2020   Procedure: CYSTOSCOPY WITH STENT PLACEMENT;  Surgeon: Irine Seal, MD;  Location: [facility] ORS;  Service: Urology;  Laterality: Right;  . FRACTURE SURGERY    . KNEE ARTHROSCOPY    . LAPAROTOMY N/A 06/30/2020   Procedure: EXPLORATORY LAPAROTOMY;  Surgeon: Jules Husbands, MD;  Location: [facility] ORS;  Service: General;  Laterality: N/A;  . LUMBAR LAMINECTOMY    . TEMPORARY DIALYSIS CATHETER N/A 06/27/2020   Procedure: TEMPORARY DIALYSIS CATHETER;  Surgeon: Katha Cabal, MD;  Location: [facility];  Service: Cardiovascular;  Laterality: N/A;     reports that she has never smoked. She has never used smokeless tobacco. She reports that she does not drink alcohol and does not use drugs.  Allergies  Allergen Reactions  .  Ibuprofen Itching  . Indomethacin   . Infliximab   . Methotrexate Derivatives   . Moexipril   . Percocet [Oxycodone-Acetaminophen] Itching  . Naprosyn [Naproxen] Rash    Family History  Problem Relation Age of Onset  . Breast cancer Neg Hx       Prior to Admission medications   Medication Sig Start Date End Date Taking? Authorizing Provider  acetaminophen (TYLENOL) 325 MG tablet Take 2 tablets (650 mg total) by mouth every 6 (six) hours as needed for moderate pain, fever  or headache. 07/10/20   Ezekiel Slocumb, DO  ascorbic acid (VITAMIN C) 500 MG tablet Take 1 tablet (500 mg total) by mouth daily. 07/10/20   Nicole Kindred A, DO  brimonidine (ALPHAGAN) 0.2 % ophthalmic solution Place 1 drop into both eyes 2 (two) times daily. 05/31/20   [provider]  CONTOUR TEST test strip 1 each by Other route daily.  03/09/18   [provider]  Cranberry 400 MG CAPS Take 400 mg by mouth daily.  03/24/20   [provider]  diltiazem (DILACOR XR) 120 MG 24 hr capsule Take 120 mg by mouth daily.    [provider]  estradiol (ESTRACE) 0.1 MG/GM vaginal cream Place 1 Applicatorful vaginally at bedtime.    [provider]  glipiZIDE (GLUCOTROL) 10 MG tablet Take 10 mg by mouth daily before breakfast.    [provider]  leflunomide (ARAVA) 10 MG tablet Take 10 mg by mouth daily.    [provider]  magnesium oxide (MAG-OX) 400 MG tablet Take by mouth.    [provider]  metFORMIN (GLUCOPHAGE-XR) 500 MG 24 hr tablet Take 500 mg by mouth 2 (two) times daily. 08/24/20   [provider]  metoprolol tartrate (LOPRESSOR) 50 MG tablet Take 50 mg by mouth 2 (two) times daily.    [provider]  omeprazole (PRILOSEC) 40 MG capsule Take 40 mg by mouth daily. 09/11/20   [provider]  rosuvastatin (CRESTOR) 10 MG tablet Take 10 mg by mouth daily.  03/24/20   [provider]  tamsulosin (FLOMAX) 0.4 MG CAPS capsule TAKE 1 CAPSULE(0.4 MG) BY MOUTH DAILY 12/05/20   Ernestine Conrad, Larene Beach A, PA-C  tiZANidine (ZANAFLEX) 4 MG tablet  09/26/20   [provider]  vitamin B-12 (CYANOCOBALAMIN) 1000 MCG tablet Take 1,000 mcg by mouth daily.    [provider]    Physical Exam: Vitals:   02/13/21 2206 02/13/21 2207 02/14/21 0100 02/14/21 0231  BP:  (!) 213/105 (!) 175/66 (!) 177/69  Pulse: 90  62 61  Resp: 20  13 18   Temp: 98.2 F (36.8 C)   98 F (36.7 C)  TempSrc: Oral    Oral  SpO2: 97%  100% 98%  Weight:  72.6 kg  71.1 kg  Height:  5\' 6"  (1.676 m)       Vitals:   02/13/21 2206 02/13/21 2207 02/14/21 0100 02/14/21 0231  BP:  (!) 213/105 (!) 175/66 (!) 177/69  Pulse: 90  62 61  Resp: 20  13 18   Temp: 98.2 F (36.8 C)   98 F (36.7 C)  TempSrc: Oral   Oral  SpO2: 97%  100% 98%  Weight:  72.6 kg  71.1 kg  Height:  5\' 6"  (1.676 m)        Constitutional: Alert and oriented x 3 . Not in any apparent distress HEENT:      Head: Normocephalic and atraumatic.  Eyes: PERLA, EOMI, Conjunctivae are normal. Sclera is non-icteric.       Mouth/Throat: Mucous membranes are moist.       Neck: Supple with no signs of meningismus. Cardiovascular: Regular rate and rhythm. No murmurs, gallops, or rubs. 2+ symmetrical distal pulses are present . No JVD. No LE edema Respiratory: Respiratory effort normal .Lungs sounds clear bilaterally. No wheezes, crackles, or rhonchi.  Gastrointestinal: Soft, tender just superior to the umbilicus and lateral left abdomen, mild distention with positive bowel sounds.  Genitourinary: No CVA tenderness. Musculoskeletal: Nontender with normal range of motion in all extremities. No cyanosis, or erythema of extremities. Neurologic:  Face is symmetric. Moving all extremities. No gross focal neurologic deficits . Skin: Skin is warm, dry.  No rash or ulcers Psychiatric: Mood and affect are normal    Labs on Admission: I have personally reviewed following labs and imaging studies  CBC: Recent Labs  Lab 02/13/21 2214  WBC 10.9*  11.2*  NEUTROABS 7.2  HGB 10.8*  10.8*  HCT 33.8*  33.9*  MCV 86.4  86.3  PLT 304  975   Basic Metabolic Panel: Recent Labs  Lab 02/13/21 2214  NA 137  K 4.0  CL 104  CO2 21*  GLUCOSE 144*  BUN 28*  CREATININE 1.32*  CALCIUM 9.1   GFR: Estimated Creatinine Clearance: 32.9 mL/min (A) (by C-G formula based on SCr of 1.32 mg/dL (H)). Liver Function Tests: Recent Labs  Lab  02/13/21 2214  AST 16  ALT 15  ALKPHOS 196*  BILITOT 0.5  PROT 7.5  ALBUMIN 3.4*   Recent Labs  Lab 02/13/21 2214  LIPASE 37   No results for input(s): AMMONIA in the last 168 hours. Coagulation Profile: No results for input(s): INR, PROTIME in the last 168 hours. Cardiac Enzymes: No results for input(s): CKTOTAL, CKMB, CKMBINDEX, TROPONINI in the last 168 hours. BNP (last 3 results) No results for input(s): PROBNP in the last 8760 hours. HbA1C: No results for input(s): HGBA1C in the last 72 hours. CBG: No results for input(s): GLUCAP in the last 168 hours. Lipid Profile: No results for input(s): CHOL, HDL, LDLCALC, TRIG, CHOLHDL, LDLDIRECT in the last 72 hours. Thyroid Function Tests: No results for input(s): TSH, T4TOTAL, FREET4, T3FREE, THYROIDAB in the last 72 hours. Anemia Panel: No results for input(s): VITAMINB12, FOLATE, FERRITIN, TIBC, IRON, RETICCTPCT in the last 72 hours. Urine analysis:    Component Value Date/Time   COLORURINE YELLOW (A) 02/13/2021 2353   APPEARANCEUR HAZY (A) 02/13/2021 2353   APPEARANCEUR Cloudy (A) 07/21/2020 1350   LABSPEC 1.008 02/13/2021 2353   LABSPEC 1.013 01/04/2015 1239   PHURINE 6.0 02/13/2021 2353   GLUCOSEU NEGATIVE 02/13/2021 2353   GLUCOSEU Negative 01/04/2015 1239   HGBUR NEGATIVE 02/13/2021 2353   BILIRUBINUR NEGATIVE 02/13/2021 2353   BILIRUBINUR Negative 07/21/2020 1350   BILIRUBINUR Negative 01/04/2015 [place] 02/13/2021 2353   PROTEINUR 100 (A) 02/13/2021 2353   NITRITE NEGATIVE 02/13/2021 2353   LEUKOCYTESUR LARGE (A) 02/13/2021 2353   LEUKOCYTESUR 2+ 01/04/2015 1239    Radiological Exams on Admission: CT Abdomen Pelvis Wo Contrast  Result Date: 02/14/2021 CLINICAL DATA:  Mid lower abdominal pain and radiates to the left lower quadrant, concern for diverticulitis EXAM: CT ABDOMEN AND PELVIS WITHOUT CONTRAST TECHNIQUE: Multidetector CT imaging of the abdomen and pelvis was performed following  the standard protocol without IV contrast. COMPARISON:  June 24, 2020 FINDINGS: Lower chest: Small volume of fluid in the left major fissure.  Bibasilar atelectasis. Thoracic aortic atherosclerosis. Normal size heart. No significant pericardial effusion/thickening. Small hiatal hernia. Hepatobiliary: Unremarkable noncontrast appearance of the hepatic parenchyma. Gallbladder is predominantly decompressed otherwise unremarkable. No biliary ductal dilation. Pancreas: Within normal limits. Spleen: Within normal limits. Adrenals/Urinary Tract: Bilateral adrenal glands are unremarkable. Stable positioning of the right double-J ureteral stent with proximal pigtail in the renal pelvis and distal pigtail in the bladder and similar mild right hydronephrosis. Left kidney is unremarkable without hydronephrosis or renal stones. Urinary bladder is predominantly decompressed around a Foley catheter with mild residual bladder wall thickening likely accentuated by under distension. Stomach/Bowel: Small hiatal hernia. Stomach is mildly distended with ingested contents. Normal positioning of the duodenum/ligament of [place]. There fluid-filled and gaseous lead dilated loops of small bowel throughout the abdomen measuring up to 3.3 cm with transition relatively decompressed small bowel in the central lower abdomen similar location to prior. However there is gas and fluid noted throughout the large bowel with only the sigmoid colon just proximal to the rectosigmoid anastomosis being relatively decompressed however there is gas and stool in the rectum. Vascular/Lymphatic: Extensive aortic and branch vessel atherosclerosis. No pathologically enlarged abdominal or pelvic lymph nodes. Reproductive: Diminutive or absent uterus and ovaries as before. Other: Mesenteric edema without overt ascites. Musculoskeletal: Extensive previous posterior thoracolumbar spinal fusion which extends to the iliac wings and appears stable. No acute osseous  abnormality visualized. IMPRESSION: 1. Dilated gas and fluid-filled loops of small bowel throughout the abdomen measuring up to 3.3 cm with transition to relatively decompressed small bowel in the central lower abdomen similar in location to prior study. However there is gas and fluid noted throughout the large bowel with only the sigmoid colon just proximal to the rectosigmoid anastomosis being relatively decompressed but with gas and stool in a minimally distended rectum. Findings are favored to represent a partial or developing small bowel obstruction. 2. Stable positioning of the right double-J ureteral stent with similar mild right hydronephrosis. Urinary bladder decompressed by Foley catheter with persistent bladder wall thickening. 3. Extensive aortic and branch vessel atherosclerosis. 4. Small hiatal hernia. 5. Aortic atherosclerosis. Aortic Atherosclerosis (ICD10-I70.0). Electronically Signed   By: Dahlia Bailiff MD   On: 02/14/2021 00:11     Assessment/Plan 80 year old female with history of type 2 diabetes, rheumatoid arthritis on chronic immunosuppressive therapy, HTN, remote history of colon cancer, atrial fibrillation not on anticoagulation, chronic indwelling Foley exchanged 2 weeks prior, as well as history of small bowel obstruction with laparotomy in 06/2020 who presents to the ED with a complaint of abdominal pain and vomiting.  In the periumbilical  region radiating to the left upper quadrant and left lower quadrant that started around dinnertime.  She had 2 episodes of emesis on arrival to the ED which were nonbloody and nonbilious.  She has been passing flatus but has had no stool.  She denies fever or chills, cough or chest pain. ED course: On arrival BP 213/105, pulse 90, O2 sat 97% on room air and afebrile.  WBC 11,000 with hemoglobin 10.8, creatinine 1.32 which is her baseline for CKD 3B.  Urinalysis showed large leukocyte esterase. EKG, personally viewed and interpreted NSR at 64  with right bundle branch block and nonspecific ST-T wave changes Imaging: CT abdomen and pelvis without contrast: Findings favored to represent a partial or developing small bowel obstruction.  Please refer to detailed report    SBO (small bowel obstruction) ([place])    History of laparotomy for SBO 06/2020 - Patient presents with  abdominal pain, 2 episodes of vomiting, still passing flatus - Keep NPO.  NG tube not placed at this time - IV hydration, pain control and IV antiemetics - Surgical consult    DM type 2 - Sliding scale insulin coverage    Rheumatoid arthritis ([place]) - Hold leflunomide for now    Essential hypertension -Metoprolol as needed IV    Paroxysmal A-fib ([facility]) - Not on systemic anticoagulation per med list review pending med rec - IV metoprolol every 6    Stage 3b chronic kidney disease ([place]) - Creatinine 1.32 which appears to be baseline    DVT prophylaxis: Lovenox  Code Status: full code  Family Communication:  none  Disposition Plan: Back to previous home environment Consults called: none  Status:At the time of admission, it appears that the appropriate admission status for this patient is INPATIENT. This is judged to be reasonable and necessary in order to provide the required intensity of service to ensure the patient's safety given the presenting symptoms, physical exam findings, and initial radiographic and laboratory data in the context of their  Comorbid conditions.   Patient requires inpatient status due to high intensity of service, high risk for further deterioration and high frequency of surveillance required.   I certify that at the point of admission it is my clinical judgment that the patient will require inpatient hospital care spanning beyond [place] MD [facility]     02/14/2021, 2:56 AM

## 2021-02-15 ENCOUNTER — Inpatient Hospital Stay: Payer: Medicare Other

## 2021-02-15 DIAGNOSIS — K5651 Intestinal adhesions [bands], with partial obstruction: Principal | ICD-10-CM

## 2021-02-15 DIAGNOSIS — E876 Hypokalemia: Secondary | ICD-10-CM

## 2021-02-15 LAB — COMPREHENSIVE METABOLIC PANEL
ALT: 12 U/L (ref 0–44)
AST: 12 U/L — ABNORMAL LOW (ref 15–41)
Albumin: 2.7 g/dL — ABNORMAL LOW (ref 3.5–5.0)
Alkaline Phosphatase: 157 U/L — ABNORMAL HIGH (ref 38–126)
Anion gap: 10 (ref 5–15)
BUN: 12 mg/dL (ref 8–23)
CO2: 22 mmol/L (ref 22–32)
Calcium: 8.9 mg/dL (ref 8.9–10.3)
Chloride: 104 mmol/L (ref 98–111)
Creatinine, Ser: 0.92 mg/dL (ref 0.44–1.00)
GFR, Estimated: >60 mL/min (ref >60)
Glucose, Bld: 175 mg/dL — ABNORMAL HIGH (ref 70–99)
Potassium: 3.4 mmol/L — ABNORMAL LOW (ref 3.5–5.1)
Sodium: 136 mmol/L (ref 135–145)
Total Bilirubin: 0.4 mg/dL (ref 0.3–1.2)
Total Protein: 6.2 g/dL — ABNORMAL LOW (ref 6.5–8.1)

## 2021-02-15 LAB — GLUCOSE, CAPILLARY
Glucose-Capillary: 147 mg/dL — ABNORMAL HIGH (ref 70–99)
Glucose-Capillary: 151 mg/dL — ABNORMAL HIGH (ref 70–99)
Glucose-Capillary: 190 mg/dL — ABNORMAL HIGH (ref 70–99)
Glucose-Capillary: 196 mg/dL — ABNORMAL HIGH (ref 70–99)
Glucose-Capillary: 197 mg/dL — ABNORMAL HIGH (ref 70–99)
Glucose-Capillary: 211 mg/dL — ABNORMAL HIGH (ref 70–99)

## 2021-02-15 LAB — MAGNESIUM: Magnesium: 1.4 mg/dL — ABNORMAL LOW (ref 1.7–2.4)

## 2021-02-15 MED ORDER — PANTOPRAZOLE SODIUM 40 MG PO TBEC
40.0000 mg | DELAYED_RELEASE_TABLET | Freq: Every day | ORAL | Status: DC
  Administered 2021-02-15 – 2021-02-16 (×2): 40 mg via ORAL
  Filled 2021-02-15 (×2): qty 1

## 2021-02-15 MED ORDER — DILTIAZEM HCL ER COATED BEADS 120 MG PO CP24
120.0000 mg | ORAL_CAPSULE | Freq: Every day | ORAL | Status: DC
  Administered 2021-02-15 – 2021-02-16 (×2): 120 mg via ORAL
  Filled 2021-02-15 (×2): qty 1

## 2021-02-15 MED ORDER — MAGNESIUM SULFATE 2 GM/50ML IV SOLN
2.0000 g | Freq: Once | INTRAVENOUS | Status: AC
  Administered 2021-02-15: 2 g via INTRAVENOUS
  Filled 2021-02-15: qty 50

## 2021-02-15 MED ORDER — POTASSIUM CHLORIDE CRYS ER 20 MEQ PO TBCR
40.0000 meq | EXTENDED_RELEASE_TABLET | Freq: Once | ORAL | Status: AC
  Administered 2021-02-15: 40 meq via ORAL
  Filled 2021-02-15: qty 2

## 2021-02-15 MED ORDER — ROSUVASTATIN CALCIUM 10 MG PO TABS
10.0000 mg | ORAL_TABLET | Freq: Every day | ORAL | Status: DC
  Administered 2021-02-15 – 2021-02-16 (×2): 10 mg via ORAL
  Filled 2021-02-15 (×2): qty 1

## 2021-02-15 MED ORDER — HYDRALAZINE HCL 20 MG/ML IJ SOLN
10.0000 mg | Freq: Once | INTRAMUSCULAR | Status: AC
  Administered 2021-02-15: 10 mg via INTRAVENOUS
  Filled 2021-02-15: qty 1

## 2021-02-15 NOTE — Progress Notes (Signed)
[facility] SURGICAL PROGRESS NOTE (cpt [phone])  Hospital Day(s): 1.   Interval History: Patient seen and examined, no acute events or new complaints overnight. Patient reports she feels about the same but overall improved. Abdomen remains distended. She denies fever, chills, nausea, emesis. Renal function has normalized, sCr - 0.92. Mild hypokalemia to 3.4 and hypomagnesemia to 1.4. She was able to have return of bowel function yesterday and two recorded BMs. She is on CLD; tolerating this but does not like any of the options. She continues to endorse flatus and having bowel movements.   Review of Systems:  Constitutional: denies fever, chills  HEENT: denies cough or congestion  Respiratory: denies any shortness of breath  Cardiovascular: denies chest pain or palpitations  Gastrointestinal: + distension, denies abdominal pain, N/V, or diarrhea/and bowel function as per interval history Genitourinary: denies burning with urination or urinary frequency   Vital signs in last 24 hours: [min-max] current  Temp:  [97.7 F (36.5 C)-98.3 F (36.8 C)] 98.3 F (36.8 C) (05/18 0618) Pulse Rate:  [60-85] 85 (05/18 0618) Resp:  [16-18] 18 (05/18 0618) BP: (166-205)/(70-97) 166/70 (05/18 0618) SpO2:  [97 %-100 %] 98 % (05/18 0618)     Height: 5\' 6"  (167.6 cm) Weight: 71.1 kg BMI (Calculated): 25.31   Intake/Output last 2 shifts:  05/17 0701 - 05/18 0700 In: 2483.7 [P.O.:600; I.V.:1883.7] Out: 8182 [Urine:4675]   Physical Exam:  Constitutional: alert, cooperative and no distress  HENT: normocephalic without obvious abnormality  Eyes: PERRL, EOM's grossly intact and symmetric   Respiratory: breathing non-labored at rest  Cardiovascular: regular rate and sinus rhythm  Gastrointestinal: Soft, non-tender, she remains distended and tympanic, no rebound/guarding. Previous laparotomy incision seen Musculoskeletal: no edema or wounds, motor and sensation grossly intact, NT    Labs:   CBC Latest Ref Rng & Units 02/14/2021 02/13/2021 02/13/2021  WBC 4.0 - 10.5 K/uL 11.4(H) 10.9(H) 11.2(H)  Hemoglobin 12.0 - 15.0 g/dL 10.6(L) 10.8(L) 10.8(L)  Hematocrit 36.0 - 46.0 % 33.1(L) 33.8(L) 33.9(L)  Platelets 150 - 400 K/uL 292 304 296   CMP Latest Ref Rng & Units 02/15/2021 02/14/2021 02/13/2021  Glucose 70 - 99 mg/dL 175(H) 161(H) 144(H)  BUN 8 - 23 mg/dL 12 22 28(H)  Creatinine 0.44 - 1.00 mg/dL 0.92 1.06(H) 1.32(H)  Sodium 135 - 145 mmol/L 136 139 137  Potassium 3.5 - 5.1 mmol/L 3.4(L) 4.1 4.0  Chloride 98 - 111 mmol/L 104 107 104  CO2 22 - 32 mmol/L 22 25 21(L)  Calcium 8.9 - 10.3 mg/dL 8.9 8.7(L) 9.1  Total Protein 6.5 - 8.1 g/dL 6.2(L) - 7.5  Total Bilirubin 0.3 - 1.2 mg/dL 0.4 - 0.5  Alkaline Phos 38 - 126 U/L 157(H) - 196(H)  AST 15 - 41 U/L 12(L) - 16  ALT 0 - 44 U/L 12 - 15     Imaging studies: No new pertinent imaging studies   Assessment/Plan: (ICD-10's: K91.609) 80 y.o. 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   - Will advance to full liquids; hold off on further advancement until distension improved  - No emergent surgical intervention             - Monitor abdominal examination; on-going bowel function             - Pain control prn (minimize narcotics); antiemetics prn             - Serial KUBs as needed              -  Mobilization encouraged              - Further management per primary service; we will follow   All of the above findings and recommendations were discussed with the patient, and the medical team, and all of patient's questions were answered to her expressed satisfaction.  -- Edison Simon, PA-C [facility] 02/15/2021, 7:11 AM [phone] M-F: 7am - 4pm

## 2021-02-15 NOTE — Evaluation (Signed)
Occupational Therapy Evaluation Patient Details Name: Stephanie [name] MRN: [id number] DOB: [birth year] Today's Date: 02/15/2021    History of Present Illness 80 y.o. female with medical history significant for Type 2 diabetes, rheumatoid arthritis on chronic immunosuppressive therapy, HTN, remote history of colon cancer, atrial fibrillation not on anticoagulation, chronic indwelling Foley exchanged 2 weeks prior, as well as history of small bowel obstruction with laparotomy in 06/2020 who presents to the ED with a complaint of abdominal pain in the periumbilical  region radiating to the left upper quadrant and left lower quadrant. Admitted with small bowel obstruction.   Clinical Impression   Pt seen for OT evaluation this date in setting of acute hospitalization d/t SBO. Pt reports being MOD I for ADLs/ADL mobility at home at baseline. Pt presents this date with 4/10 abd pain impacting her ability to efficiently perform ADLs/ADL transfers. Pt requires SUPV/CGA for ADL transfers with RW and CGA for seated LB ADLs d/t some abdominal discomfort with bending over and baseline decreased knee ROM (h/o sxs and arthritis). Pt benefits from skilled OT to improve dynamic balance as it applies to IADL skills and general functional activity tolerance. Will continue to follow acutely, but do not anticipate pt will require f/u OT services in the home upon d/c.     Follow Up Recommendations  No OT follow up    Equipment Recommendations  None recommended by OT    Recommendations for Other Services       Precautions / Restrictions Precautions Precautions: Fall Restrictions Weight Bearing Restrictions: No      Mobility Bed Mobility Overal bed mobility: Modified Independent             General bed mobility comments: Pt able to rise to sitting w/o assist    Transfers Overall transfer level: Needs assistance Equipment used: Rolling walker (2 wheeled) Transfers: Sit to/from Stand Sit to Stand:  Supervision;Min guard         General transfer comment: increased time, CGA on first trial, pt endorses fatigue, able to stand on subsequent trials with SUPV only    Balance Overall balance assessment: Mild deficits observed, not formally tested                                         ADL either performed or assessed with clinical judgement   ADL Overall ADL's : Needs assistance/impaired                                       General ADL Comments: SUPV/CGA for standing ADLs, ADL transfers and fxl mobility with RW for UE support.     Vision Patient Visual Report: No change from baseline       Perception     Praxis      Pertinent Vitals/Pain Pain Assessment: 0-10 Pain Score: 4  Pain Location: abdomen Pain Descriptors / Indicators: Tender Pain Intervention(s): Monitored during session     Hand Dominance Right   Extremity/Trunk Assessment Upper Extremity Assessment Upper Extremity Assessment: Generalized weakness   Lower Extremity Assessment Lower Extremity Assessment: Generalized weakness       Communication Communication Communication: No difficulties   Cognition Arousal/Alertness: Awake/alert Behavior During Therapy: WFL for tasks assessed/performed Overall Cognitive Status: Within Functional Limits for tasks assessed  General Comments  G static standing with UE support on RW, F dynamic balance requiring UE support to accept a challenge to her balance or reach outside BOS    Exercises Other Exercises Other Exercises: OT educates re: importance of OOB acitvity, role of OT. Pt wiht good understanding   Shoulder Instructions      Home Living Family/patient expects to be discharged to:: Private residence Living Arrangements: Spouse/significant other Available Help at Discharge: Family Type of Home: House Home Access: Stairs to enter [employer] of Steps: 1  partial step   Home Layout: One level     Bathroom Shower/Tub: Teacher, early years/pre: Handicapped height     Home Equipment: Environmental consultant - 4 wheels;Walker - 2 wheels;Wheelchair - manual          Prior Functioning/Environment Level of Independence: Independent with assistive device(s)        Comments: 4WW in house, primarily a [place] ambulator, no falls history in 6 months. Independent c ADL        OT Problem List: Decreased strength;Decreased activity tolerance;Pain      OT Treatment/Interventions: Self-care/ADL training;Therapeutic activities;Therapeutic exercise;Patient/family education    OT Goals(Current goals can be found in the care plan section) Acute Rehab OT Goals Patient Stated Goal: get stronger so I can go home OT Goal Formulation: With patient Time For Goal Achievement: 03/01/21 Potential to Achieve Goals: Good ADL Goals Pt Will Perform Grooming: with supervision;standing Pt Will Transfer to Toilet: with supervision;ambulating;grab bars Pt/caregiver will Perform Home Exercise Program: Increased strength;Both right and left upper extremity;With Supervision  OT Frequency: Min 1X/week   Barriers to D/C:            Co-evaluation              AM-PAC OT "6 Clicks" Daily Activity     Outcome Measure Help from another person eating meals?: None Help from another person taking care of personal grooming?: A Little Help from another person toileting, which includes using toliet, bedpan, or urinal?: A Little Help from another person bathing (including washing, rinsing, drying)?: A Little Help from another person to put on and taking off regular upper body clothing?: None Help from another person to put on and taking off regular lower body clothing?: A Little 6 Click Score: 20   End of Session Equipment Utilized During Treatment: Gait belt;Rolling walker Nurse Communication: Mobility status  Activity Tolerance: Patient tolerated treatment  well Patient left: with call bell/phone within reach  OT Visit Diagnosis: Muscle weakness (generalized) (M62.81);Pain Pain - part of body:  (abdomen)                Time: 5027-7412 OT Time Calculation (min): 18 min Charges:  OT General Charges $OT Visit: 1 Visit OT Evaluation $OT Eval Low Complexity: 1 Low OT Treatments $Self Care/Home Management : 8-22 mins  Gerrianne Scale, MS, OTR/L ascom [phone] 02/15/21, 5:50 PM

## 2021-02-15 NOTE — Progress Notes (Addendum)
PROGRESS NOTE    Stephanie [name]  VHQ:[id number] DOB: June 13, 1941 DOA: 02/13/2021 PCP: Rusty Aus, MD   Assessment & Plan:   Principal Problem:   SBO (small bowel obstruction) ([place]) Active Problems:   DM type 2 with diabetic mixed hyperlipidemia ([place])   Rheumatoid arthritis ([place])   Essential hypertension   Paroxysmal A-fib ([place])   Stage 3b chronic kidney disease ([place])   SBO: w/ hx of laparotomy for SBO 06/2020. Advanced to clear liquid diet as per general surg. Continue on IVFs. Zofran prn. General surg following and recs apprec   Hypokalemia: KCl repleated. Will continue to monitor  Hypomagnesemia: mg sulfate ordered. Will continue to monitor   DM2: continue on SSI w/ accuchecks   RA: continue to hold home dose of leflunomide   HTN: continue on metoprolol   PAF: continue on metoprolol. Not on anticoagulation as per med rec   CKDIIIb: Cr continues to trend down daily   HLD: continue on statin    DVT prophylaxis: lovenox Code Status: full  Family Communication:  Disposition Plan: depends on PT/OT recs  Level of care: Med-Surg   Status is: Inpatient  Remains inpatient appropriate because:IV treatments appropriate due to intensity of illness or inability to take PO and Inpatient level of care appropriate due to severity of illness   Dispo: The patient is from: Home              Anticipated d/c is to: Home vs SNF               Patient currently is not medically stable to d/c.   Difficult to place patient : unclear        Consultants:   General surg    Procedures:    Antimicrobials:    Subjective: Pt c/o vomiting once this morning  Objective: Vitals:   02/14/21 2022 02/14/21 2024 02/15/21 0415 02/15/21 0618  BP: (!) 172/72 (!) 172/72 (!) 205/97 (!) 166/70  Pulse: 72 60 75 85  Resp:  18 18 18   Temp:  98.3 F (36.8 C) 98.2 F (36.8 C) 98.3 F (36.8 C)  TempSrc:  Oral Oral Oral  SpO2:  97% 98% 98%  Weight:      Height:         Intake/Output Summary (Last 24 hours) at 02/15/2021 0726 Last data filed at 02/15/2021 0400 Gross per 24 hour  Intake 2483.72 ml  Output 4675 ml  Net -2191.28 ml   Filed Weights   02/13/21 2207 02/14/21 0231  Weight: 72.6 kg 71.1 kg    Examination:  General exam: Appears calm and comfortable  Respiratory system: Clear to auscultation. Respiratory effort normal. Cardiovascular system: S1 & S2 +. No rubs, gallops or clicks.  Gastrointestinal system: Abdomen is distended, soft and nontender. Hypoactive bowel sounds Central nervous system: Alert and oriented. Moves all extremities  Psychiatry: Judgement and insight appear normal. Mood & affect appropriate.     Data Reviewed: I have personally reviewed following labs and imaging studies  CBC: Recent Labs  Lab 02/13/21 2214 02/14/21 0520  WBC 10.9*  11.2* 11.4*  NEUTROABS 7.2  --   HGB 10.8*  10.8* 10.6*  HCT 33.8*  33.9* 33.1*  MCV 86.4  86.3 84.9  PLT 304  296 413   Basic Metabolic Panel: Recent Labs  Lab 02/13/21 2214 02/14/21 0520 02/15/21 0608  NA 137 139 136  K 4.0 4.1 3.4*  CL 104 107 104  CO2 21* 25 22  GLUCOSE 144*  161* 175*  BUN 28* 22 12  CREATININE 1.32* 1.06* 0.92  CALCIUM 9.1 8.7* 8.9  MG  --   --  1.4*   GFR: Estimated Creatinine Clearance: 47.2 mL/min (by C-G formula based on SCr of 0.92 mg/dL). Liver Function Tests: Recent Labs  Lab 02/13/21 2214 02/15/21 0608  AST 16 12*  ALT 15 12  ALKPHOS 196* 157*  BILITOT 0.5 0.4  PROT 7.5 6.2*  ALBUMIN 3.4* 2.7*   Recent Labs  Lab 02/13/21 2214  LIPASE 37   No results for input(s): AMMONIA in the last 168 hours. Coagulation Profile: No results for input(s): INR, PROTIME in the last 168 hours. Cardiac Enzymes: No results for input(s): CKTOTAL, CKMB, CKMBINDEX, TROPONINI in the last 168 hours. BNP (last 3 results) No results for input(s): PROBNP in the last 8760 hours. HbA1C: Recent Labs    02/14/21 0520  HGBA1C 7.7*    CBG: Recent Labs  Lab 02/14/21 0825 02/14/21 1132 02/14/21 1950 02/14/21 2334 02/15/21 0408  GLUCAP 128* 115* 195* 140* 197*   Lipid Profile: No results for input(s): CHOL, HDL, LDLCALC, TRIG, CHOLHDL, LDLDIRECT in the last 72 hours. Thyroid Function Tests: No results for input(s): TSH, T4TOTAL, FREET4, T3FREE, THYROIDAB in the last 72 hours. Anemia Panel: No results for input(s): VITAMINB12, FOLATE, FERRITIN, TIBC, IRON, RETICCTPCT in the last 72 hours. Sepsis Labs: Recent Labs  Lab 02/13/21 2355 02/14/21 0520  LATICACIDVEN 1.7 1.4    Recent Results (from the past 240 hour(s))  Culture, blood (routine x 2)     Status: None (Preliminary result)   Collection Time: 02/13/21 11:53 PM   Specimen: BLOOD  Result Value Ref Range Status   Specimen Description BLOOD LEFT ANTECUBITAL  Final   Special Requests   Final    BOTTLES DRAWN AEROBIC AND ANAEROBIC Blood Culture results may not be optimal due to an excessive volume of blood received in culture bottles   Culture   Final    NO GROWTH < 24 HOURS Performed at [facility], [address] [place], [place] [address]    Report Status PENDING  Incomplete  Resp Panel by RT-PCR (Flu A&B, Covid) Urine, Clean Catch     Status: None   Collection Time: 02/13/21 11:55 PM   Specimen: Urine, Clean Catch; Nasopharyngeal(NP) swabs in vial transport medium  Result Value Ref Range Status   SARS Coronavirus 2 by RT PCR NEGATIVE NEGATIVE Final    Comment: (NOTE) SARS-CoV-2 target nucleic acids are NOT DETECTED.  The SARS-CoV-2 RNA is generally detectable in upper respiratory specimens during the acute phase of infection. The lowest concentration of SARS-CoV-2 viral copies this assay can detect is 138 copies/mL. A negative result does not preclude SARS-Cov-2 infection and should not be used as the sole basis for treatment or other patient management decisions. A negative result may occur with  improper specimen  collection/handling, submission of specimen other than nasopharyngeal swab, presence of viral mutation(s) within the areas targeted by this assay, and inadequate number of viral copies(<138 copies/mL). A negative result must be combined with clinical observations, patient history, and epidemiological information. The expected result is Negative.  Fact Sheet for Patients:  EntrepreneurPulse.com.au  Fact Sheet for Healthcare Providers:  IncredibleEmployment.be  This test is no t yet approved or cleared by the [place] FDA and  has been authorized for detection and/or diagnosis of SARS-CoV-2 by FDA under an Emergency Use Authorization (EUA). This EUA will remain  in effect (meaning this test can be used)  for the duration of the COVID-19 declaration under Section 564(b)(1) of the Act, 21 U.S.C.section 360bbb-3(b)(1), unless the authorization is terminated  or revoked sooner.       Influenza A by PCR NEGATIVE NEGATIVE Final   Influenza B by PCR NEGATIVE NEGATIVE Final    Comment: (NOTE) The Xpert Xpress SARS-CoV-2/FLU/RSV plus assay is intended as an aid in the diagnosis of influenza from Nasopharyngeal swab specimens and should not be used as a sole basis for treatment. Nasal washings and aspirates are unacceptable for Xpert Xpress SARS-CoV-2/FLU/RSV testing.  Fact Sheet for Patients: EntrepreneurPulse.com.au  Fact Sheet for Healthcare Providers: IncredibleEmployment.be  This test is not yet approved or cleared by the [place] FDA and has been authorized for detection and/or diagnosis of SARS-CoV-2 by FDA under an Emergency Use Authorization (EUA). This EUA will remain in effect (meaning this test can be used) for the duration of the COVID-19 declaration under Section 564(b)(1) of the Act, 21 U.S.C. section 360bbb-3(b)(1), unless the authorization is terminated or revoked.  Performed at [facility], [address] [place], [place] [address]   Culture, blood (routine x 2)     Status: None (Preliminary result)   Collection Time: 02/14/21 12:26 AM   Specimen: BLOOD  Result Value Ref Range Status   Specimen Description BLOOD RIGHT ANTECUBITAL  Final   Special Requests   Final    BOTTLES DRAWN AEROBIC AND ANAEROBIC Blood Culture results may not be optimal due to an excessive volume of blood received in culture bottles   Culture   Final    NO GROWTH < 24 HOURS Performed at [facility], [address], [place], [place] [address]    Report Status PENDING  Incomplete         Radiology Studies: CT Abdomen Pelvis Wo Contrast  Result Date: 02/14/2021 CLINICAL DATA:  Mid lower abdominal pain and radiates to the left lower quadrant, concern for diverticulitis EXAM: CT ABDOMEN AND PELVIS WITHOUT CONTRAST TECHNIQUE: Multidetector CT imaging of the abdomen and pelvis was performed following the standard protocol without IV contrast. COMPARISON:  June 24, 2020 FINDINGS: Lower chest: Small volume of fluid in the left major fissure. Bibasilar atelectasis. Thoracic aortic atherosclerosis. Normal size heart. No significant pericardial effusion/thickening. Small hiatal hernia. Hepatobiliary: Unremarkable noncontrast appearance of the hepatic parenchyma. Gallbladder is predominantly decompressed otherwise unremarkable. No biliary ductal dilation. Pancreas: Within normal limits. Spleen: Within normal limits. Adrenals/Urinary Tract: Bilateral adrenal glands are unremarkable. Stable positioning of the right double-J ureteral stent with proximal pigtail in the renal pelvis and distal pigtail in the bladder and similar mild right hydronephrosis. Left kidney is unremarkable without hydronephrosis or renal stones. Urinary bladder is predominantly decompressed around a Foley catheter with mild residual bladder wall thickening likely accentuated by under distension. Stomach/Bowel: Small  hiatal hernia. Stomach is mildly distended with ingested contents. Normal positioning of the duodenum/ligament of [place]. There fluid-filled and gaseous lead dilated loops of small bowel throughout the abdomen measuring up to 3.3 cm with transition relatively decompressed small bowel in the central lower abdomen similar location to prior. However there is gas and fluid noted throughout the large bowel with only the sigmoid colon just proximal to the rectosigmoid anastomosis being relatively decompressed however there is gas and stool in the rectum. Vascular/Lymphatic: Extensive aortic and branch vessel atherosclerosis. No pathologically enlarged abdominal or pelvic lymph nodes. Reproductive: Diminutive or absent uterus and ovaries as before. Other: Mesenteric edema without overt ascites. Musculoskeletal: Extensive previous posterior thoracolumbar spinal  fusion which extends to the iliac wings and appears stable. No acute osseous abnormality visualized. IMPRESSION: 1. Dilated gas and fluid-filled loops of small bowel throughout the abdomen measuring up to 3.3 cm with transition to relatively decompressed small bowel in the central lower abdomen similar in location to prior study. However there is gas and fluid noted throughout the large bowel with only the sigmoid colon just proximal to the rectosigmoid anastomosis being relatively decompressed but with gas and stool in a minimally distended rectum. Findings are favored to represent a partial or developing small bowel obstruction. 2. Stable positioning of the right double-J ureteral stent with similar mild right hydronephrosis. Urinary bladder decompressed by Foley catheter with persistent bladder wall thickening. 3. Extensive aortic and branch vessel atherosclerosis. 4. Small hiatal hernia. 5. Aortic atherosclerosis. Aortic Atherosclerosis (ICD10-I70.0). Electronically Signed   By: Dahlia Bailiff MD   On: 02/14/2021 00:11        Scheduled Meds: .  Chlorhexidine Gluconate Cloth  6 each Topical Daily  . enoxaparin (LOVENOX) injection  40 mg Subcutaneous Q24H  . insulin aspart  0-9 Units Subcutaneous Q4H  . metoprolol tartrate  50 mg Oral BID   Continuous Infusions: . lactated ringers 125 mL/hr at 02/14/21 1303     LOS: 1 day    Time spent: 32 mins    Wyvonnia Dusky, MD [facility] Pager [phone]  If 7PM-7AM, please contact night-coverage 02/15/2021, 7:26 AM

## 2021-02-15 NOTE — Progress Notes (Signed)
PHARMACY CONSULT NOTE  Pharmacy Consult for Electrolyte Monitoring and Replacement   Recent Labs: Potassium (mmol/L)  Date Value  02/15/2021 3.4 (L)  01/14/2015 3.9   Magnesium (mg/dL)  Date Value  02/15/2021 1.4 (L)   Calcium (mg/dL)  Date Value  02/15/2021 8.9   Calcium, Total (mg/dL)  Date Value  01/14/2015 8.7 (L)   Albumin (g/dL)  Date Value  02/15/2021 2.7 (L)  12/22/2012 3.3 (L)   Phosphorus (mg/dL)  Date Value  07/10/2020 3.1   Sodium (mmol/L)  Date Value  02/15/2021 136  01/14/2015 132 (L)   Corrected Ca: 9.7 mg/dL  Assessment: 80 y.o. female with medical history significant for Type 2 diabetes, rheumatoid arthritis on chronic immunosuppressive therapy, HTN, remote history of colon cancer, atrial fibrillation not on anticoagulation, chronic indwelling Foley exchanged 2 weeks prior, as well as history of small bowel obstruction with laparotomy in 06/2020 who presents to the ED with a SBO. Pharmacy was asked to review and replace electrolytes.  Goal of Therapy:  Electrolytes WNL  Plan:   2 grams magnesium sulfate IV x 1  Oral KCl 40 mEq x 1  Re-check electrolytes in am  Dallie Piles ,PharmD Clinical Pharmacist 02/15/2021 11:34 AM

## 2021-02-15 NOTE — Evaluation (Signed)
Physical Therapy Evaluation Patient Details Name: Stephanie [name] MRN: [id number] DOB: [birth year] Today's Date: 02/15/2021   History of Present Illness  80 y.o. female with medical history significant for Type 2 diabetes, rheumatoid arthritis on chronic immunosuppressive therapy, HTN, remote history of colon cancer, atrial fibrillation not on anticoagulation, chronic indwelling Foley exchanged 2 weeks prior, as well as history of small bowel obstruction with laparotomy in 06/2020 who presents to the ED with a complaint of abdominal pain in the periumbilical  region radiating to the left upper quadrant and left lower quadrant. Admitted with small bowel obstruction.  Clinical Impression  Pt here with small bowel obstruction, but has been able to have a few BMs in the last 24 hours; feeling better.  She was able to do mobility and transfers well.  She was able to ambulate ~60 ft (and likely could have done a lot more) however her HR quickly started to rise and despite not feeling overly fatigued her HR rose to 180s and further ambulation was deferred.  She did have L knee hyperextension during [place], this is baseline (h/o multiple back surgeries).  Discussed findings with both nurse and MD, to begin home meds now that the SBO is clearing.     Follow Up Recommendations Home health PT;Supervision - Intermittent    Equipment Recommendations  None recommended by PT    Recommendations for Other Services       Precautions / Restrictions Precautions Precautions: Fall Restrictions Weight Bearing Restrictions: No      Mobility  Bed Mobility Overal bed mobility: Modified Independent             General bed mobility comments: Pt able to rise to sitting w/o assist    Transfers Overall transfer level: Modified independent Equipment used: Rolling walker (2 wheeled)             General transfer comment: Pt able to rise to standing without assist, good  confidence  Ambulation/Gait Ambulation/Gait assistance: Min guard Gait Distance (Feet): 60 Feet Assistive device: Rolling walker (2 wheeled)       General Gait Details: Pt with slow but steady gait.  Has pronounced L knee hyperextension during weight acceptance.  Pt had only minimal subjective fatigue but HR elevated quickly (as high as 180 bpm).  Pt with no safety/balance issues and likely could have walked much further if HR were controlled.  Stairs            Wheelchair Mobility    Modified Rankin (Stroke Patients Only)       Balance Overall balance assessment: Modified Independent                                           Pertinent Vitals/Pain Pain Assessment: 0-10 Pain Score: 4  Pain Location: abdomen    Home Living Family/patient expects to be discharged to:: Private residence Living Arrangements: Spouse/significant other Available Help at Discharge: Family Type of Home: House Home Access: Stairs to enter   [employer] of Steps: 1 partial step Home Layout: One level Home Equipment: Environmental consultant - 4 wheels;Walker - 2 wheels;Wheelchair - manual      Prior Function Level of Independence: Independent with assistive device(s)         Comments: 4WW in house, primarily a [place] ambulator, no falls history in 6 months. Independent c ADL     Hand Dominance  Dominant Hand: Right    Extremity/Trunk Assessment   Upper Extremity Assessment Upper Extremity Assessment: Generalized weakness    Lower Extremity Assessment Lower Extremity Assessment: Generalized weakness (L LE with chronic numbness, grossly 3/5 strength)       Communication   Communication: No difficulties  Cognition Arousal/Alertness: Awake/alert Behavior During Therapy: WFL for tasks assessed/performed Overall Cognitive Status: Within Functional Limits for tasks assessed                                        General Comments      Exercises      Assessment/Plan    PT Assessment Patient needs continued PT services  PT Problem List Decreased strength;Decreased activity tolerance;Decreased range of motion;Decreased balance;Decreased mobility;Decreased knowledge of use of DME;Decreased safety awareness;Cardiopulmonary status limiting activity       PT Treatment Interventions DME instruction;Gait training;Functional mobility training;Therapeutic activities;Therapeutic exercise;Balance training;Cognitive remediation;Patient/family education    PT Goals (Current goals can be found in the Care Plan section)  Acute Rehab PT Goals Patient Stated Goal: Start taking normal home meds to control heart rate PT Goal Formulation: With patient Time For Goal Achievement: 03/01/21 Potential to Achieve Goals: Good    Frequency Min 2X/week   Barriers to discharge        Co-evaluation               AM-PAC PT "6 Clicks" Mobility  Outcome Measure Help needed turning from your back to your side while in a flat bed without using bedrails?: None Help needed moving from lying on your back to sitting on the side of a flat bed without using bedrails?: None Help needed moving to and from a bed to a chair (including a wheelchair)?: None Help needed standing up from a chair using your arms (e.g., wheelchair or bedside chair)?: None Help needed to walk in hospital room?: A Little Help needed climbing 3-5 steps with a railing? : A Lot 6 Click Score: 21    End of Session Equipment Utilized During Treatment: Gait belt Activity Tolerance: Treatment limited secondary to medical complications (Comment) Patient left: with chair alarm set;with call bell/phone within reach;with family/visitor present Nurse Communication: Mobility status PT Visit Diagnosis: Muscle weakness (generalized) (M62.81);Difficulty in walking, not elsewhere classified (R26.2)    Time: 1021-1050 PT Time Calculation (min) (ACUTE ONLY): 29 min   Charges:   PT Evaluation $PT  Eval Low Complexity: 1 Low PT Treatments $Gait Training: 8-22 mins        Kreg Shropshire, DPT 02/15/2021, 1:02 PM

## 2021-02-16 ENCOUNTER — Inpatient Hospital Stay: Payer: Medicare Other

## 2021-02-16 DIAGNOSIS — E1169 Type 2 diabetes mellitus with other specified complication: Secondary | ICD-10-CM

## 2021-02-16 DIAGNOSIS — I10 Essential (primary) hypertension: Secondary | ICD-10-CM

## 2021-02-16 DIAGNOSIS — E782 Mixed hyperlipidemia: Secondary | ICD-10-CM

## 2021-02-16 LAB — CBC
HCT: 32.3 % — ABNORMAL LOW (ref 36.0–46.0)
Hemoglobin: 10.3 g/dL — ABNORMAL LOW (ref 12.0–15.0)
MCH: 27.1 pg (ref 26.0–34.0)
MCHC: 31.9 g/dL (ref 30.0–36.0)
MCV: 85 fL (ref 80.0–100.0)
Platelets: 301 10*3/uL (ref 150–400)
RBC: 3.8 MIL/uL — ABNORMAL LOW (ref 3.87–5.11)
RDW: 13.8 % (ref 11.5–15.5)
WBC: 7.3 10*3/uL (ref 4.0–10.5)
nRBC: 0 % (ref 0.0–0.2)

## 2021-02-16 LAB — BASIC METABOLIC PANEL
Anion gap: 7 (ref 5–15)
BUN: 15 mg/dL (ref 8–23)
CO2: 24 mmol/L (ref 22–32)
Calcium: 8.8 mg/dL — ABNORMAL LOW (ref 8.9–10.3)
Chloride: 108 mmol/L (ref 98–111)
Creatinine, Ser: 1.22 mg/dL — ABNORMAL HIGH (ref 0.44–1.00)
GFR, Estimated: 45 mL/min — ABNORMAL LOW (ref >60)
Glucose, Bld: 134 mg/dL — ABNORMAL HIGH (ref 70–99)
Potassium: 4.4 mmol/L (ref 3.5–5.1)
Sodium: 139 mmol/L (ref 135–145)

## 2021-02-16 LAB — URINE CULTURE: Culture: >=100000 — AB

## 2021-02-16 LAB — MAGNESIUM: Magnesium: 2.2 mg/dL (ref 1.7–2.4)

## 2021-02-16 LAB — GLUCOSE, CAPILLARY
Glucose-Capillary: 135 mg/dL — ABNORMAL HIGH (ref 70–99)
Glucose-Capillary: 134 mg/dL — ABNORMAL HIGH (ref 70–99)
Glucose-Capillary: 219 mg/dL — ABNORMAL HIGH (ref 70–99)

## 2021-02-16 MED ORDER — HYDRALAZINE HCL 50 MG PO TABS
50.0000 mg | ORAL_TABLET | Freq: Four times a day (QID) | ORAL | Status: DC | PRN
  Administered 2021-02-16: 50 mg via ORAL
  Filled 2021-02-16: qty 1

## 2021-02-16 MED ORDER — INSULIN ASPART 100 UNIT/ML IJ SOLN
0.0000 [IU] | Freq: Three times a day (TID) | INTRAMUSCULAR | Status: DC
Start: 2021-02-16 — End: 2021-02-16
  Administered 2021-02-16: 3 [IU] via SUBCUTANEOUS
  Filled 2021-02-16: qty 1

## 2021-02-16 NOTE — TOC Transition Note (Signed)
Transition of Care [facility]) - CM/SW Discharge Note   Patient Details  Name: EMAREE CHIU MRN: [id number] Date of Birth: Jun 22, 1941  [facility]) CM/SW Contact:  Candie Chroman, LCSW Phone Number: 02/16/2021, 12:22 PM   Clinical Narrative: Patient has orders to discharge home today. [employer] representative is aware. No further concerns. CSW signing off.    Final next level of care: [place] Barriers to Discharge: Barriers Resolved   Patient Goals and CMS Choice Patient states their goals for this hospitalization and ongoing recovery are:: Patient asleep   Choice offered to / list presented to : Spouse  Discharge Placement                    Patient and family notified of of transfer: 02/16/21  Discharge Plan and Services                          HH Arranged: PT [facility] Agency: [facility] [place] Date [facility] Agency Contacted: 02/16/21   Representative spoke with at [place]: [place] ([place]) Interventions     Readmission Risk Interventions Readmission Risk Prevention Plan 02/14/2021 06/21/2020 06/19/2020  Transportation Screening Complete Complete Complete  PCP or Specialist Appt within 3-5 Days Complete Complete -  HRI or [place] - Complete -  Social Work Consult for [place] Planning/Counseling Complete Complete -  Palliative Care Screening Not Applicable Not Applicable -  Medication Review Press photographer) Complete (No Data) Complete  PCP or Specialist appointment within 3-5 days of discharge - - Complete  HRI or [place] - - Complete  SW Recovery Care/Counseling Consult - - Complete  [place] - - Not Applicable  Some recent data might be hidden

## 2021-02-16 NOTE — TOC Progression Note (Signed)
Transition of Care [facility]) - Progression Note    Patient Details  Name: Stephanie [name] MRN: [id number] Date of Birth: Aug 21, 1941  Transition of Care [facility]) CM/SW [place], LCSW Phone Number: 02/16/2021, 11:31 AM  Clinical Narrative:  Patient is agreeable to home health PT. Cannot remember name of agency she worked with when she discharged from [facility]. Called husband and he said it was [facility]. Referral made and accepted for home health PT.    Expected Discharge Plan: Home/Self Care Barriers to Discharge: Continued Medical Work up  Expected Discharge Plan and Services Expected Discharge Plan: Home/Self Care       Living arrangements for the past 2 months: Single Family Home                                       Social Determinants of Health (SDOH) Interventions    Readmission Risk Interventions Readmission Risk Prevention Plan 02/14/2021 06/21/2020 06/19/2020  Transportation Screening Complete Complete Complete  PCP or Specialist Appt within 3-5 Days Complete Complete -  HRI or [place] - Complete -  Social Work Consult for [place] Planning/Counseling Complete Complete -  Palliative Care Screening Not Applicable Not Applicable -  Medication Review Press photographer) Complete (No Data) Complete  PCP or Specialist appointment within 3-5 days of discharge - - Complete  HRI or [place] - - Complete  SW Recovery Care/Counseling Consult - - Complete  [place] - - Not Applicable  Some recent data might be hidden

## 2021-02-16 NOTE — Progress Notes (Signed)
CC: SBO Subjective: Patient refers feeling better.  No nausea no vomiting.  Tolerating full liquid diet. City flatus.  KUB personally reviewed showing evidence of improvement of bowel pattern.  There is no evidence of air-fluid levels.  There is air in the colon. White count has normalized.  I have also updated the husband in detail.  Objective: Vital signs in last 24 hours: Temp:  [98.1 F (36.7 C)-98.7 F (37.1 C)] 98.3 F (36.8 C) (05/19 1008) Pulse Rate:  [62-98] 70 (05/19 1008) Resp:  [16-19] 16 (05/19 1008) BP: (130-195)/(66-102) 192/80 (05/19 1008) SpO2:  [96 %-99 %] 96 % (05/19 1008) Last BM Date: 02/16/21  Intake/Output from previous day: 05/18 0701 - 05/19 0700 In: 3754.3 [P.O.:1740; I.V.:1996.6; IV Piggyback:17.7] Out: 2000 [Urine:2000] Intake/Output this shift: Total I/O In: 520 [P.O.:520] Out: 800 [Urine:800]  Physical exam:  NAD, alert Abd: soft, decrease bd, soft, non tender, no peritonitis Ext: well perfused, no edema  Lab Results: CBC  Recent Labs    02/14/21 0520 02/16/21 0502  WBC 11.4* 7.3  HGB 10.6* 10.3*  HCT 33.1* 32.3*  PLT 292 301   BMET Recent Labs    02/15/21 0608 02/16/21 0502  NA 136 139  K 3.4* 4.4  CL 104 108  CO2 22 24  GLUCOSE 175* 134*  BUN 12 15  CREATININE 0.92 1.22*  CALCIUM 8.9 8.8*   PT/INR No results for input(s): LABPROT, INR in the last 72 hours. ABG No results for input(s): PHART, HCO3 in the last 72 hours.  Invalid input(s): PCO2, PO2  Studies/Results: DG ABD ACUTE 2+V W 1V CHEST  Result Date: 02/16/2021 CLINICAL DATA:  Bowel obstruction.  Hypertension. EXAM: DG ABDOMEN ACUTE WITH 1 VIEW CHEST COMPARISON:  Abdomen series Feb 15, 2021 FINDINGS: AP chest: There is slight left base atelectasis. Lungs elsewhere are clear. Heart size and pulmonary vascularity are normal. There is aortic atherosclerosis. Supine and upright abdomen: There is moderate stool throughout colon. There remain loops of dilated bowel,  although bowel is less dilated compared to 1 day prior. No appreciable air-fluid levels. No evident free air. Double-J stent noted on the right, unchanged in position. Extensive lumbar and sacral fusion noted with support hardware intact. IMPRESSION: Less bowel dilatation compared to 1 day prior. No air-fluid levels. No free air. Slight left base atelectasis. Lungs elsewhere clear. Heart size within normal limits. Aortic Atherosclerosis (ICD10-I70.0). Electronically Signed   By: Lowella Grip III M.D.   On: 02/16/2021 08:14   DG ABD ACUTE 2+V W 1V CHEST  Result Date: 02/15/2021 CLINICAL DATA:  Small bowel obstruction. EXAM: DG ABDOMEN ACUTE WITH 1 VIEW CHEST COMPARISON:  November 03, 2020.  Feb 13, 2021. FINDINGS: Dilated small bowel loops are seen in the pelvis and central abdomen. Right-sided ureteral stent is again noted. Phleboliths are noted in the pelvis. Surgical clips and staples are noted in the pelvis is well. Status post surgical posterior fusion of lower thoracic and lumbar spine. Heart size and mediastinal contours are within normal limits. Both lungs are clear. IMPRESSION: Dilated small bowel loops seen in the pelvis and centrally within the abdomen suggesting ileus or possibly obstruction. No acute cardiopulmonary disease. Electronically Signed   By: Marijo Conception M.D.   On: 02/15/2021 08:39    Anti-infectives: Anti-infectives (From admission, onward)   None      Assessment/Plan:  Resolving small bowel obstruction.  May advance diet.  No need for surgical intervention.  May follow-up my office in 2 to 3  weeks.  I have updated the husband.  I have also discussed the plan with RN.  We will be available. [place] ivf. Please note that I spent over 35 minutes in this encounter with greater than 50% spent in coordination and counseling of his care  Caroleen Hamman, MD, [facility]  02/16/2021

## 2021-02-16 NOTE — Discharge Summary (Signed)
Physician Discharge Summary  Stephanie [name] SWF:[id number] DOB: 1941-08-11 DOA: 02/13/2021  PCP: Stephanie Aus, MD  Admit date: 02/13/2021 Discharge date: 02/16/2021  Admitted From: home  Disposition: home w/ home health   Recommendations for Outpatient Follow-up:  1. Follow up with PCP in 1-2 weeks 2. F/u w/ general surg, Dr. Dahlia [name], in 2-3 weeks  Home Health: yes Equipment/Devices:  Discharge Condition: stable  CODE STATUS: full  Diet recommendation: Heart Healthy / Carb Modified   Brief/Interim Summary: HPI was taken from Dr. Damita [name]: Stephanie [name] is a 80 y.o. female with medical history significant for Type 2 diabetes, rheumatoid arthritis on chronic immunosuppressive therapy, HTN, remote history of colon cancer, atrial fibrillation not on anticoagulation, chronic indwelling Foley exchanged 2 weeks prior, as well as history of small bowel obstruction with laparotomy in 06/2020 who presents to the ED with a complaint of abdominal pain in the periumbilical  region radiating to the left upper quadrant and left lower quadrant that started around dinnertime.  She had 2 episodes of emesis on arrival to the ED which were nonbloody and nonbilious.  She has been passing flatus but has had no stool.  She denies fever or chills, cough or chest pain. ED course: On arrival BP 213/105, pulse 90, O2 sat 97% on room air and afebrile.  WBC 11,000 with hemoglobin 10.8, creatinine 1.32 which is her baseline for CKD 3B.  Urinalysis showed large leukocyte esterase. EKG, personally viewed and interpreted NSR at 64 with right bundle branch block and nonspecific ST-T wave changes Imaging: CT abdomen and pelvis without contrast: Findings favored to represent a partial or developing small bowel obstruction.  Please refer to detailed report  NG tube was held as patient stopped vomiting and appeared comfortable.  Hospitalist consulted for admission.   Hospital course from Dr. Jimmye [name] 5/18-5/19/22: Pt was found to  have SBO and was treated conservatively w/ NPO, IVFs. Pt has hx of laparotomy for SBO in 06/2020. Pt was able to pass gas and have a bowel movement prior to d/c. Pt will f/u w/ general surg, Dr. Dahlia [name], in 2-3 weeks. For more information, please see previous progress/consult notes.   Discharge Diagnoses:  Principal Problem:   SBO (small bowel obstruction) (HCC) Active Problems:   DM type 2 with diabetic mixed hyperlipidemia (HCC)   Rheumatoid arthritis ([place])   Essential hypertension   Paroxysmal A-fib (HCC)   Stage 3b chronic kidney disease (HCC)  SBO: w/ hx of laparotomy for SBO 06/2020. Soft diet and advance as tolerated as per gen surg. Resolving. General surg following and recs apprec   Hypokalemia: WNL today   Hypomagnesemia: WNL today   DM2: continue on SSI w/ accuchecks while   RA: continue to hold home dose of leflunomide & restart at d/c  HTN: continue on metoprolol   PAF: continue on metoprolol. Not on anticoagulation as per med rec   CKDIIIb: Cr is labile. Avoid nephrotoxic meds  HLD: continue on statin    Discharge Instructions  Discharge Instructions    Diet - low sodium heart healthy   Complete by: As directed    Diet Carb Modified   Complete by: As directed    Discharge instructions   Complete by: As directed    F/u w/ PCP in 1-2 weeks. F/u w/ general surgery, Dr. Dahlia [name], in 2-3 weeks   Increase activity slowly   Complete by: As directed      Allergies as of 02/16/2021      Reactions  Ibuprofen Itching   Indomethacin    Infliximab    Methotrexate Derivatives    Moexipril    Percocet [oxycodone-acetaminophen] Itching   Naprosyn [naproxen] Rash      Medication List    STOP taking these medications   diltiazem 120 MG 24 hr capsule Commonly known as: CARDIZEM CD     TAKE these medications   acetaminophen 325 MG tablet Commonly known as: TYLENOL Take 2 tablets (650 mg total) by mouth every 6 (six) hours as needed for moderate pain, fever or  headache.   ascorbic acid 500 MG tablet Commonly known as: VITAMIN C Take 1 tablet (500 mg total) by mouth daily.   brimonidine 0.2 % ophthalmic solution Commonly known as: ALPHAGAN Place 1 drop into both eyes 2 (two) times daily.   Contour Test test strip Generic drug: glucose blood 1 each by Other route daily.   Cranberry 400 MG Caps Take 400 mg by mouth daily.   diltiazem 120 MG 24 hr capsule Commonly known as: DILACOR XR Take 120 mg by mouth daily.   estradiol 0.1 MG/GM vaginal cream Commonly known as: ESTRACE Place 1 Applicatorful vaginally at bedtime.   glipiZIDE 10 MG tablet Commonly known as: GLUCOTROL Take 5 mg by mouth 2 (two) times daily before a meal.   leflunomide 10 MG tablet Commonly known as: ARAVA Take 10 mg by mouth daily.   magnesium oxide 400 MG tablet Commonly known as: MAG-OX Take by mouth.   metFORMIN 500 MG 24 hr tablet Commonly known as: GLUCOPHAGE-XR Take 500 mg by mouth 2 (two) times daily.   metoprolol tartrate 50 MG tablet Commonly known as: LOPRESSOR Take 50 mg by mouth 2 (two) times daily.   omeprazole 40 MG capsule Commonly known as: PRILOSEC Take 40 mg by mouth daily.   PROBIOTIC-10 PO Take by mouth.   rosuvastatin 10 MG tablet Commonly known as: CRESTOR Take 10 mg by mouth daily.   tamsulosin 0.4 MG Caps capsule Commonly known as: FLOMAX TAKE 1 CAPSULE(0.4 MG) BY MOUTH DAILY   tiZANidine 4 MG tablet Commonly known as: ZANAFLEX   vitamin B-12 1000 MCG tablet Commonly known as: CYANOCOBALAMIN Take 1,000 mcg by mouth daily.       Follow-up Information    Stephanie Husbands, MD. Go on 03/08/2021.   Specialty: General Surgery Why: 9:30am appointment Contact information: [address] [place] [address] [phone]        [facility]   Specialty: Home Health Services Why: They will follow up with you for your home health physical therapy needs. Contact information: [address] [place] [address] [id number]              Allergies  Allergen Reactions  . Ibuprofen Itching  . Indomethacin   . Infliximab   . Methotrexate Derivatives   . Moexipril   . Percocet [Oxycodone-Acetaminophen] Itching  . Naprosyn [Naproxen] Rash    Consultations:  General surg, Dr. Dahlia [name]    Procedures/Studies: CT Abdomen Pelvis Wo Contrast  Result Date: 02/14/2021 CLINICAL DATA:  Mid lower abdominal pain and radiates to the left lower quadrant, concern for diverticulitis EXAM: CT ABDOMEN AND PELVIS WITHOUT CONTRAST TECHNIQUE: Multidetector CT imaging of the abdomen and pelvis was performed following the standard protocol without IV contrast. COMPARISON:  June 24, 2020 FINDINGS: Lower chest: Small volume of fluid in the left major fissure. Bibasilar atelectasis. Thoracic aortic atherosclerosis. Normal size heart. No significant pericardial effusion/thickening. Small  hiatal hernia. Hepatobiliary: Unremarkable noncontrast appearance of the hepatic parenchyma. Gallbladder is predominantly decompressed otherwise unremarkable. No biliary ductal dilation. Pancreas: Within normal limits. Spleen: Within normal limits. Adrenals/Urinary Tract: Bilateral adrenal glands are unremarkable. Stable positioning of the right double-J ureteral stent with proximal pigtail in the renal pelvis and distal pigtail in the bladder and similar mild right hydronephrosis. Left kidney is unremarkable without hydronephrosis or renal stones. Urinary bladder is predominantly decompressed around a Foley catheter with mild residual bladder wall thickening likely accentuated by under distension. Stomach/Bowel: Small hiatal hernia. Stomach is mildly distended with ingested contents. Normal positioning of the duodenum/ligament of [place]. There fluid-filled and gaseous lead dilated loops of small bowel throughout the abdomen measuring up to 3.3 cm with transition relatively decompressed small bowel in the  central lower abdomen similar location to prior. However there is gas and fluid noted throughout the large bowel with only the sigmoid colon just proximal to the rectosigmoid anastomosis being relatively decompressed however there is gas and stool in the rectum. Vascular/Lymphatic: Extensive aortic and branch vessel atherosclerosis. No pathologically enlarged abdominal or pelvic lymph nodes. Reproductive: Diminutive or absent uterus and ovaries as before. Other: Mesenteric edema without overt ascites. Musculoskeletal: Extensive previous posterior thoracolumbar spinal fusion which extends to the iliac wings and appears stable. No acute osseous abnormality visualized. IMPRESSION: 1. Dilated gas and fluid-filled loops of small bowel throughout the abdomen measuring up to 3.3 cm with transition to relatively decompressed small bowel in the central lower abdomen similar in location to prior study. However there is gas and fluid noted throughout the large bowel with only the sigmoid colon just proximal to the rectosigmoid anastomosis being relatively decompressed but with gas and stool in a minimally distended rectum. Findings are favored to represent a partial or developing small bowel obstruction. 2. Stable positioning of the right double-J ureteral stent with similar mild right hydronephrosis. Urinary bladder decompressed by Foley catheter with persistent bladder wall thickening. 3. Extensive aortic and branch vessel atherosclerosis. 4. Small hiatal hernia. 5. Aortic atherosclerosis. Aortic Atherosclerosis (ICD10-I70.0). Electronically Signed   By: Stephanie Bailiff MD   On: 02/14/2021 00:11   DG ABD ACUTE 2+V W 1V CHEST  Result Date: 02/16/2021 CLINICAL DATA:  Bowel obstruction.  Hypertension. EXAM: DG ABDOMEN ACUTE WITH 1 VIEW CHEST COMPARISON:  Abdomen series Feb 15, 2021 FINDINGS: AP chest: There is slight left base atelectasis. Lungs elsewhere are clear. Heart size and pulmonary vascularity are normal. There is  aortic atherosclerosis. Supine and upright abdomen: There is moderate stool throughout colon. There remain loops of dilated bowel, although bowel is less dilated compared to 1 day prior. No appreciable air-fluid levels. No evident free air. Double-J stent noted on the right, unchanged in position. Extensive lumbar and sacral fusion noted with support hardware intact. IMPRESSION: Less bowel dilatation compared to 1 day prior. No air-fluid levels. No free air. Slight left base atelectasis. Lungs elsewhere clear. Heart size within normal limits. Aortic Atherosclerosis (ICD10-I70.0). Electronically Signed   By: Lowella Grip III M.D.   On: 02/16/2021 08:14   DG ABD ACUTE 2+V W 1V CHEST  Result Date: 02/15/2021 CLINICAL DATA:  Small bowel obstruction. EXAM: DG ABDOMEN ACUTE WITH 1 VIEW CHEST COMPARISON:  November 03, 2020.  Feb 13, 2021. FINDINGS: Dilated small bowel loops are seen in the pelvis and central abdomen. Right-sided ureteral stent is again noted. Phleboliths are noted in the pelvis. Surgical clips and staples are noted in the pelvis is well. Status post surgical posterior  fusion of lower thoracic and lumbar spine. Heart size and mediastinal contours are within normal limits. Both lungs are clear. IMPRESSION: Dilated small bowel loops seen in the pelvis and centrally within the abdomen suggesting ileus or possibly obstruction. No acute cardiopulmonary disease. Electronically Signed   By: Marijo Conception M.D.   On: 02/15/2021 08:39      Subjective: Pt c/o fatigue    Discharge Exam: Vitals:   02/16/21 0841 02/16/21 1008  BP: (!) 185/100 (!) 192/80  Pulse: 80 70  Resp: 16 16  Temp: 98.6 F (37 C) 98.3 F (36.8 C)  SpO2: 99% 96%   Vitals:   02/15/21 2353 02/16/21 0502 02/16/21 0841 02/16/21 1008  BP: (!) 163/70 (!) 195/84 (!) 185/100 (!) 192/80  Pulse: 62 66 80 70  Resp: 18 16 16 16   Temp: 98.2 F (36.8 C) 98.4 F (36.9 C) 98.6 F (37 C) 98.3 F (36.8 C)  TempSrc:    Oral   SpO2: 98% 98% 99% 96%  Weight:      Height:        General: Pt is alert, awake, not in acute distress Cardiovascular:  S1/S2 +, no rubs, no gallops Respiratory: CTA bilaterally, no wheezing, no rhonchi Abdominal: Soft, NT, ND, bowel sounds + Extremities: no edema, no cyanosis    The results of significant diagnostics from this hospitalization (including imaging, microbiology, ancillary and laboratory) are listed below for reference.     Microbiology: Recent Results (from the past 240 hour(s))  Culture, blood (routine x 2)     Status: None (Preliminary result)   Collection Time: 02/13/21 11:53 PM   Specimen: BLOOD  Result Value Ref Range Status   Specimen Description BLOOD LEFT ANTECUBITAL  Final   Special Requests   Final    BOTTLES DRAWN AEROBIC AND ANAEROBIC Blood Culture results may not be optimal due to an excessive volume of blood received in culture bottles   Culture   Final    NO GROWTH 2 DAYS Performed at [facility], [address], [place], [place] [address]    Report Status PENDING  Incomplete  Urine culture     Status: Abnormal   Collection Time: 02/13/21 11:53 PM   Specimen: Urine, Random  Result Value Ref Range Status   Specimen Description   Final    URINE, RANDOM Performed at [facility], [address], [place], [place] [address]    Special Requests   Final    NONE Performed at [facility], [address] [place], [place] [address]    Culture (A)  Final    >=100,000 COLONIES/mL ESCHERICHIA COLI >=100,000 COLONIES/mL AEROCOCCUS SPECIES Standardized susceptibility testing for this organism is not available. Performed at [facility], [place] [address] [place], [place] [address]    Report Status 02/16/2021 FINAL  Final   Organism ID, Bacteria ESCHERICHIA COLI (A)  Final      Susceptibility   Escherichia coli - MIC*    AMPICILLIN 16 INTERMEDIATE Intermediate     CEFAZOLIN <=4 SENSITIVE Sensitive     CEFEPIME  <=0.12 SENSITIVE Sensitive     CEFTRIAXONE <=0.25 SENSITIVE Sensitive     CIPROFLOXACIN >=4 RESISTANT Resistant     GENTAMICIN <=1 SENSITIVE Sensitive     IMIPENEM <=0.25 SENSITIVE Sensitive     NITROFURANTOIN <=16 SENSITIVE Sensitive     TRIMETH/SULFA <=20 SENSITIVE Sensitive     AMPICILLIN/SULBACTAM 4 SENSITIVE Sensitive     PIP/TAZO <=4 SENSITIVE Sensitive     * >=  100,000 COLONIES/mL ESCHERICHIA COLI  Resp Panel by RT-PCR (Flu A&B, Covid) Urine, Clean Catch     Status: None   Collection Time: 02/13/21 11:55 PM   Specimen: Urine, Clean Catch; Nasopharyngeal(NP) swabs in vial transport medium  Result Value Ref Range Status   SARS Coronavirus 2 by RT PCR NEGATIVE NEGATIVE Final    Comment: (NOTE) SARS-CoV-2 target nucleic acids are NOT DETECTED.  The SARS-CoV-2 RNA is generally detectable in upper respiratory specimens during the acute phase of infection. The lowest concentration of SARS-CoV-2 viral copies this assay can detect is 138 copies/mL. A negative result does not preclude SARS-Cov-2 infection and should not be used as the sole basis for treatment or other patient management decisions. A negative result may occur with  improper specimen collection/handling, submission of specimen other than nasopharyngeal swab, presence of viral mutation(s) within the areas targeted by this assay, and inadequate number of viral copies(<138 copies/mL). A negative result must be combined with clinical observations, patient history, and epidemiological information. The expected result is Negative.  Fact Sheet for Patients:  EntrepreneurPulse.com.au  Fact Sheet for Healthcare Providers:  IncredibleEmployment.be  This test is no t yet approved or cleared by the [place] FDA and  has been authorized for detection and/or diagnosis of SARS-CoV-2 by FDA under an Emergency Use Authorization (EUA). This EUA will remain  in effect (meaning this test can be  used) for the duration of the COVID-19 declaration under Section 564(b)(1) of the Act, 21 U.S.C.section 360bbb-3(b)(1), unless the authorization is terminated  or revoked sooner.       Influenza A by PCR NEGATIVE NEGATIVE Final   Influenza B by PCR NEGATIVE NEGATIVE Final    Comment: (NOTE) The Xpert Xpress SARS-CoV-2/FLU/RSV plus assay is intended as an aid in the diagnosis of influenza from Nasopharyngeal swab specimens and should not be used as a sole basis for treatment. Nasal washings and aspirates are unacceptable for Xpert Xpress SARS-CoV-2/FLU/RSV testing.  Fact Sheet for Patients: EntrepreneurPulse.com.au  Fact Sheet for Healthcare Providers: IncredibleEmployment.be  This test is not yet approved or cleared by the [place] FDA and has been authorized for detection and/or diagnosis of SARS-CoV-2 by FDA under an Emergency Use Authorization (EUA). This EUA will remain in effect (meaning this test can be used) for the duration of the COVID-19 declaration under Section 564(b)(1) of the Act, 21 U.S.C. section 360bbb-3(b)(1), unless the authorization is terminated or revoked.  Performed at [facility], [address] [place], [place] [address]   Culture, blood (routine x 2)     Status: None (Preliminary result)   Collection Time: 02/14/21 12:26 AM   Specimen: BLOOD  Result Value Ref Range Status   Specimen Description BLOOD RIGHT ANTECUBITAL  Final   Special Requests   Final    BOTTLES DRAWN AEROBIC AND ANAEROBIC Blood Culture results may not be optimal due to an excessive volume of blood received in culture bottles   Culture   Final    NO GROWTH 2 DAYS Performed at [facility], [address] [place], [place] [address]    Report Status PENDING  Incomplete     Labs: BNP (last 3 results) Recent Labs    06/18/20 0809  BNP 865.7*   Basic Metabolic Panel: Recent Labs  Lab 02/13/21 2214  02/14/21 0520 02/15/21 0608 02/16/21 0502  NA 137 139 136 139  K 4.0 4.1 3.4* 4.4  CL 104 107 104 108  CO2 21* 25 22 24   GLUCOSE 144*  161* 175* 134*  BUN 28* 22 12 15   CREATININE 1.32* 1.06* 0.92 1.22*  CALCIUM 9.1 8.7* 8.9 8.8*  MG  --   --  1.4* 2.2   Liver Function Tests: Recent Labs  Lab 02/13/21 2214 02/15/21 0608  AST 16 12*  ALT 15 12  ALKPHOS 196* 157*  BILITOT 0.5 0.4  PROT 7.5 6.2*  ALBUMIN 3.4* 2.7*   Recent Labs  Lab 02/13/21 2214  LIPASE 37   No results for input(s): AMMONIA in the last 168 hours. CBC: Recent Labs  Lab 02/13/21 2214 02/14/21 0520 02/16/21 0502  WBC 10.9*  11.2* 11.4* 7.3  NEUTROABS 7.2  --   --   HGB 10.8*  10.8* 10.6* 10.3*  HCT 33.8*  33.9* 33.1* 32.3*  MCV 86.4  86.3 84.9 85.0  PLT 304  296 292 301   Cardiac Enzymes: No results for input(s): CKTOTAL, CKMB, CKMBINDEX, TROPONINI in the last 168 hours. BNP: Invalid input(s): POCBNP CBG: Recent Labs  Lab 02/15/21 2056 02/15/21 2351 02/16/21 0459 02/16/21 0751 02/16/21 1122  GLUCAP 196* 147* 135* 134* 219*   D-Dimer No results for input(s): DDIMER in the last 72 hours. Hgb A1c Recent Labs    02/14/21 0520  HGBA1C 7.7*   Lipid Profile No results for input(s): CHOL, HDL, LDLCALC, TRIG, CHOLHDL, LDLDIRECT in the last 72 hours. Thyroid function studies No results for input(s): TSH, T4TOTAL, T3FREE, THYROIDAB in the last 72 hours.  Invalid input(s): FREET3 Anemia work up No results for input(s): VITAMINB12, FOLATE, FERRITIN, TIBC, IRON, RETICCTPCT in the last 72 hours. Urinalysis    Component Value Date/Time   COLORURINE YELLOW (A) 02/13/2021 2353   APPEARANCEUR HAZY (A) 02/13/2021 2353   APPEARANCEUR Cloudy (A) 07/21/2020 1350   LABSPEC 1.008 02/13/2021 2353   LABSPEC 1.013 01/04/2015 1239   PHURINE 6.0 02/13/2021 2353   GLUCOSEU NEGATIVE 02/13/2021 2353   GLUCOSEU Negative 01/04/2015 1239   HGBUR NEGATIVE 02/13/2021 2353   BILIRUBINUR NEGATIVE  02/13/2021 2353   BILIRUBINUR Negative 07/21/2020 1350   BILIRUBINUR Negative 01/04/2015 [place] 02/13/2021 2353   PROTEINUR 100 (A) 02/13/2021 2353   NITRITE NEGATIVE 02/13/2021 2353   LEUKOCYTESUR LARGE (A) 02/13/2021 2353   LEUKOCYTESUR 2+ 01/04/2015 1239   Sepsis Labs Invalid input(s): PROCALCITONIN,  WBC,  LACTICIDVEN Microbiology Recent Results (from the past 240 hour(s))  Culture, blood (routine x 2)     Status: None (Preliminary result)   Collection Time: 02/13/21 11:53 PM   Specimen: BLOOD  Result Value Ref Range Status   Specimen Description BLOOD LEFT ANTECUBITAL  Final   Special Requests   Final    BOTTLES DRAWN AEROBIC AND ANAEROBIC Blood Culture results may not be optimal due to an excessive volume of blood received in culture bottles   Culture   Final    NO GROWTH 2 DAYS Performed at [facility], [address], [place], [place] [address]    Report Status PENDING  Incomplete  Urine culture     Status: Abnormal   Collection Time: 02/13/21 11:53 PM   Specimen: Urine, Random  Result Value Ref Range Status   Specimen Description   Final    URINE, RANDOM Performed at [facility], [address] [place], [place] [address]    Special Requests   Final    NONE Performed at [facility], [address] [place], [place] [address]    Culture (A)  Final    >=100,000 COLONIES/mL ESCHERICHIA COLI >=  100,000 COLONIES/mL AEROCOCCUS SPECIES Standardized susceptibility testing for this organism is not available. Performed at [facility], [place] [address] [place], [place] [address]    Report Status 02/16/2021 FINAL  Final   Organism ID, Bacteria ESCHERICHIA COLI (A)  Final      Susceptibility   Escherichia coli - MIC*    AMPICILLIN 16 INTERMEDIATE Intermediate     CEFAZOLIN <=4 SENSITIVE Sensitive     CEFEPIME <=0.12 SENSITIVE Sensitive     CEFTRIAXONE <=0.25 SENSITIVE Sensitive     CIPROFLOXACIN >=4 RESISTANT  Resistant     GENTAMICIN <=1 SENSITIVE Sensitive     IMIPENEM <=0.25 SENSITIVE Sensitive     NITROFURANTOIN <=16 SENSITIVE Sensitive     TRIMETH/SULFA <=20 SENSITIVE Sensitive     AMPICILLIN/SULBACTAM 4 SENSITIVE Sensitive     PIP/TAZO <=4 SENSITIVE Sensitive     * >=100,000 COLONIES/mL ESCHERICHIA COLI  Resp Panel by RT-PCR (Flu A&B, Covid) Urine, Clean Catch     Status: None   Collection Time: 02/13/21 11:55 PM   Specimen: Urine, Clean Catch; Nasopharyngeal(NP) swabs in vial transport medium  Result Value Ref Range Status   SARS Coronavirus 2 by RT PCR NEGATIVE NEGATIVE Final    Comment: (NOTE) SARS-CoV-2 target nucleic acids are NOT DETECTED.  The SARS-CoV-2 RNA is generally detectable in upper respiratory specimens during the acute phase of infection. The lowest concentration of SARS-CoV-2 viral copies this assay can detect is 138 copies/mL. A negative result does not preclude SARS-Cov-2 infection and should not be used as the sole basis for treatment or other patient management decisions. A negative result may occur with  improper specimen collection/handling, submission of specimen other than nasopharyngeal swab, presence of viral mutation(s) within the areas targeted by this assay, and inadequate number of viral copies(<138 copies/mL). A negative result must be combined with clinical observations, patient history, and epidemiological information. The expected result is Negative.  Fact Sheet for Patients:  EntrepreneurPulse.com.au  Fact Sheet for Healthcare Providers:  IncredibleEmployment.be  This test is no t yet approved or cleared by the [place] FDA and  has been authorized for detection and/or diagnosis of SARS-CoV-2 by FDA under an Emergency Use Authorization (EUA). This EUA will remain  in effect (meaning this test can be used) for the duration of the COVID-19 declaration under Section 564(b)(1) of the Act, 21 U.S.C.section  360bbb-3(b)(1), unless the authorization is terminated  or revoked sooner.       Influenza A by PCR NEGATIVE NEGATIVE Final   Influenza B by PCR NEGATIVE NEGATIVE Final    Comment: (NOTE) The Xpert Xpress SARS-CoV-2/FLU/RSV plus assay is intended as an aid in the diagnosis of influenza from Nasopharyngeal swab specimens and should not be used as a sole basis for treatment. Nasal washings and aspirates are unacceptable for Xpert Xpress SARS-CoV-2/FLU/RSV testing.  Fact Sheet for Patients: EntrepreneurPulse.com.au  Fact Sheet for Healthcare Providers: IncredibleEmployment.be  This test is not yet approved or cleared by the [place] FDA and has been authorized for detection and/or diagnosis of SARS-CoV-2 by FDA under an Emergency Use Authorization (EUA). This EUA will remain in effect (meaning this test can be used) for the duration of the COVID-19 declaration under Section 564(b)(1) of the Act, 21 U.S.C. section 360bbb-3(b)(1), unless the authorization is terminated or revoked.  Performed at [facility], [address] [place], [place] [address]   Culture, blood (routine x 2)     Status: None (Preliminary result)   Collection Time: 02/14/21 12:26  AM   Specimen: BLOOD  Result Value Ref Range Status   Specimen Description BLOOD RIGHT ANTECUBITAL  Final   Special Requests   Final    BOTTLES DRAWN AEROBIC AND ANAEROBIC Blood Culture results may not be optimal due to an excessive volume of blood received in culture bottles   Culture   Final    NO GROWTH 2 DAYS Performed at [facility], [address], [place], [place] [address]    Report Status PENDING  Incomplete     Time coordinating discharge: Over 30 minutes  SIGNED:   Wyvonnia Dusky, MD  [facility] 02/16/2021, 1:21 PM Pager   If 7PM-7AM, please contact night-coverage

## 2021-02-16 NOTE — Progress Notes (Signed)
Iv removed from patient. Discharge instructions given to patient. Verbalized understanding. No acute distress at this time. Family at bedside and transported patient home.

## 2021-02-16 NOTE — Progress Notes (Signed)
MD notified of continued elevated BP. Hydralazine ordered. Order given to change CBG from every 4 hours to [facility] and HS and to keep telemetry

## 2021-02-16 NOTE — Progress Notes (Signed)
Order received from Dr Dahlia Byes to discontinue LR

## 2021-02-19 LAB — CULTURE, BLOOD (ROUTINE X 2)
Culture: NO GROWTH
Culture: NO GROWTH

## 2021-02-23 DIAGNOSIS — I700 Atherosclerosis of aorta: Secondary | ICD-10-CM | POA: Insufficient documentation

## 2021-03-01 ENCOUNTER — Other Ambulatory Visit: Payer: Self-pay | Admitting: Urology

## 2021-03-02 NOTE — Progress Notes (Signed)
Cath Change/ Replacement  Patient is present today for a catheter change due to urinary retention.  9 ml of water was removed from the balloon, a 16 FR foley cath was removed with out difficulty.  Patient was cleaned and prepped in a sterile fashion with betadine. A 16 FR foley cath was replaced into the bladder no complications were noted    Urine return was noted 5 ml and urine was yellow in color. The balloon was filled with 30ml of sterile water. A leg bag was attached for drainage.  A night bag was also given to the patient and patient was given instruction on how to change from one bag to another. Patient was given proper instruction on catheter care.    Performed by: Kerman Passey, CMA and Zara Council, PA-C  Follow up: One months for Foley exchange

## 2021-03-03 ENCOUNTER — Other Ambulatory Visit: Payer: Self-pay

## 2021-03-03 ENCOUNTER — Ambulatory Visit (INDEPENDENT_AMBULATORY_CARE_PROVIDER_SITE_OTHER): Payer: Medicare Other | Admitting: Urology

## 2021-03-03 DIAGNOSIS — Z466 Encounter for fitting and adjustment of urinary device: Secondary | ICD-10-CM

## 2021-03-08 ENCOUNTER — Encounter: Payer: Self-pay | Admitting: Surgery

## 2021-03-08 ENCOUNTER — Ambulatory Visit (INDEPENDENT_AMBULATORY_CARE_PROVIDER_SITE_OTHER): Payer: Medicare Other | Admitting: Surgery

## 2021-03-08 ENCOUNTER — Other Ambulatory Visit: Payer: Self-pay

## 2021-03-08 VITALS — BP 156/82 | HR 68 | Temp 98.7°F | Ht 66.0 in | Wt 159.2 lb

## 2021-03-08 DIAGNOSIS — K56609 Unspecified intestinal obstruction, unspecified as to partial versus complete obstruction: Secondary | ICD-10-CM | POA: Diagnosis not present

## 2021-03-08 NOTE — Patient Instructions (Addendum)
Your CT is scheduled for March 21, 2021 @ 8:30 (arrive by 8:15), nothing to eat or drink 4 hours prior.      If you have any concerns or questions, please feel free to call our office. See follow up appointment below.

## 2021-03-09 NOTE — Progress Notes (Signed)
Outpatient Surgical Follow Up  03/09/2021  Stephanie [name] is an 80 y.o. female.   Chief Complaint  Patient presents with   Routine Post Op    SBO    HPI: Stephanie [name] is a 80 year old female well-known to me she is debilitated and comes in with a walker and with her husband.  Recently admitted for partial small bowel obstruction that resolved with medical management.  In the past I did exploratory laparotomy last year due to adhesions.  She is doing better and is taking p.o. and having normal bowel movements.  No abdominal pain she feels some slight distention and some gurgling.  No other acute issues. I reviewed pers. Last CT w partial SBO.  Past Medical History:  Diagnosis Date   Anemia    Arthritis    Asthma    Cancer ([place]) 1996   colon   Chronic kidney disease    Complication of anesthesia    nausea   Diabetes mellitus without complication (HCC)    GERD (gastroesophageal reflux disease)    Glaucoma    Hyperlipidemia    Hypertension    Lumbar disc disease    Small bowel obstruction ([place])    Vitamin B 12 deficiency     Past Surgical History:  Procedure Laterality Date   ABDOMINAL HYSTERECTOMY     BACK SURGERY     COLON SURGERY     COLONOSCOPY WITH PROPOFOL N/A 05/22/2017   Procedure: COLONOSCOPY WITH PROPOFOL;  Surgeon: Manya Silvas, MD;  Location: [facility] ENDOSCOPY;  Service: Endoscopy;  Laterality: N/A;   CYSTOSCOPY WITH STENT PLACEMENT Right 08/02/2020   Procedure: CYSTOSCOPY WITH STENT EXCHANGE;  Surgeon: Abbie Sons, MD;  Location: [facility] ORS;  Service: Urology;  Laterality: Right;   CYSTOSCOPY WITH STENT PLACEMENT Right 06/18/2020   Procedure: CYSTOSCOPY WITH STENT PLACEMENT;  Surgeon: Irine Seal, MD;  Location: [facility] ORS;  Service: Urology;  Laterality: Right;   FRACTURE SURGERY     KNEE ARTHROSCOPY     LAPAROTOMY N/A 06/30/2020   Procedure: EXPLORATORY LAPAROTOMY;  Surgeon: Jules Husbands, MD;  Location: [facility] ORS;  Service: General;  Laterality: N/A;   LUMBAR  LAMINECTOMY     TEMPORARY DIALYSIS CATHETER N/A 06/27/2020   Procedure: TEMPORARY DIALYSIS CATHETER;  Surgeon: Katha Cabal, MD;  Location: [facility];  Service: Cardiovascular;  Laterality: N/A;    Family History  Problem Relation Age of Onset   Breast cancer Neg Hx     Social History:  reports that she has never smoked. She has never used smokeless tobacco. She reports that she does not drink alcohol and does not use drugs.  Allergies:  Allergies  Allergen Reactions   Ibuprofen Itching   Indomethacin    Infliximab    Methotrexate Derivatives    Moexipril    Percocet [Oxycodone-Acetaminophen] Itching   Naprosyn [Naproxen] Rash    Medications reviewed.    ROS Full ROS performed and is otherwise negative other than what is stated in HPI   BP (!) 156/82   Pulse 68   Temp 98.7 F (37.1 C) (Oral)   Ht 5\' 6"  (1.676 m)   Wt 159 lb 3.2 oz (72.2 kg)   SpO2 93%   BMI 25.70 kg/m   Physical Exam Vitals and nursing note reviewed. Exam conducted with a chaperone present.  Constitutional:      General: She is not in acute distress.    Appearance: Normal appearance. She is normal weight.  Comments: Debilitated and chronically ill  Cardiovascular:     Rate and Rhythm: Normal rate and regular rhythm.  Pulmonary:     Effort: Pulmonary effort is normal. No respiratory distress.     Breath sounds: No stridor.  Abdominal:     General: Abdomen is flat. There is no distension.     Palpations: Abdomen is soft. There is no mass.     Tenderness: There is no abdominal tenderness. There is no guarding.     Hernia: No hernia is present.  Musculoskeletal:        General: No swelling or tenderness. Normal range of motion.     Cervical back: Normal range of motion and neck supple. No rigidity or tenderness.  Skin:    General: Skin is warm and dry.     Coloration: Skin is not jaundiced.  Neurological:     Mental Status: She is alert.  Psychiatric:        Mood and  Affect: Mood normal.        Behavior: Behavior normal.        Thought Content: Thought content normal.        Judgment: Judgment normal.      Assessment/Plan: 80 year old female with recurrent small bowel obstruction first recurrence after last exploratory laparotomy for SBO due to adhesions.  She now has recovered but has some chronic distention.  We will order CT enterography to make sure we are not missing any other potential small bowel pathology.  Currently clinically improving.  We will follow her up after she completes the scan.  No need for surgical intervention.  Greater than 50% of the 30 minutes  visit was spent in counseling/coordination of care   Caroleen Hamman, MD [facility]

## 2021-03-15 ENCOUNTER — Other Ambulatory Visit: Payer: Self-pay | Admitting: Internal Medicine

## 2021-03-15 DIAGNOSIS — Z1231 Encounter for screening mammogram for malignant neoplasm of breast: Secondary | ICD-10-CM

## 2021-03-21 ENCOUNTER — Ambulatory Visit
Admission: RE | Admit: 2021-03-21 | Discharge: 2021-03-21 | Disposition: A | Discharge status: Home / Self Care | Payer: Medicare Other | Source: Ambulatory Visit | Attending: Surgery | Admitting: Surgery

## 2021-03-21 ENCOUNTER — Other Ambulatory Visit: Payer: Self-pay

## 2021-03-21 DIAGNOSIS — K56609 Unspecified intestinal obstruction, unspecified as to partial versus complete obstruction: Secondary | ICD-10-CM | POA: Diagnosis present

## 2021-03-21 MED ORDER — IOHEXOL 300 MG/ML  SOLN
75.0000 mL | Freq: Once | INTRAMUSCULAR | Status: AC | PRN
  Administered 2021-03-21: 75 mL via INTRAVENOUS

## 2021-03-22 ENCOUNTER — Telehealth: Payer: Self-pay

## 2021-03-22 DIAGNOSIS — Z960 Presence of urogenital implants: Secondary | ICD-10-CM | POA: Insufficient documentation

## 2021-03-22 NOTE — Telephone Encounter (Signed)
Mr. Gammon calls back regarding his wife.  He is given the information regarding CT results, per the nurse all looked good and to follow up as scheduled with Dr. Dahlia Byes.  Mr. Sidney verbalized understanding.

## 2021-03-22 NOTE — Telephone Encounter (Signed)
LVM for pt to call the office for CT results.  

## 2021-03-28 ENCOUNTER — Ambulatory Visit
Admission: RE | Admit: 2021-03-28 | Discharge: 2021-03-28 | Disposition: A | Discharge status: Home / Self Care | Payer: Medicare Other | Source: Ambulatory Visit | Attending: Internal Medicine | Admitting: Internal Medicine

## 2021-03-28 ENCOUNTER — Other Ambulatory Visit: Payer: Self-pay

## 2021-03-28 DIAGNOSIS — Z1231 Encounter for screening mammogram for malignant neoplasm of breast: Secondary | ICD-10-CM | POA: Insufficient documentation

## 2021-03-30 ENCOUNTER — Other Ambulatory Visit: Payer: Self-pay | Admitting: Internal Medicine

## 2021-03-30 DIAGNOSIS — R928 Other abnormal and inconclusive findings on diagnostic imaging of breast: Secondary | ICD-10-CM

## 2021-03-30 DIAGNOSIS — R921 Mammographic calcification found on diagnostic imaging of breast: Secondary | ICD-10-CM

## 2021-04-05 ENCOUNTER — Other Ambulatory Visit: Payer: Self-pay

## 2021-04-05 ENCOUNTER — Ambulatory Visit (INDEPENDENT_AMBULATORY_CARE_PROVIDER_SITE_OTHER): Payer: Medicare Other | Admitting: Surgery

## 2021-04-05 ENCOUNTER — Encounter: Payer: Self-pay | Admitting: Surgery

## 2021-04-05 VITALS — BP 148/74 | HR 60 | Temp 98.5°F | Ht 66.0 in | Wt 162.0 lb

## 2021-04-05 DIAGNOSIS — K56609 Unspecified intestinal obstruction, unspecified as to partial versus complete obstruction: Secondary | ICD-10-CM

## 2021-04-05 NOTE — Progress Notes (Signed)
Outpatient Surgical Follow Up  04/05/2021  Stephanie [name] is an 80 y.o. [name].   SBO  HPI: Stephanie [name] is a 80 year old [name] well-known to me she is debilitated and comes in with a walker and with her husband.  Recently admitted for partial small bowel obstruction that resolved with medical management.  In the past I did exploratory laparotomy last year due to adhesions.  She is doing better and is taking p.o. and having normal bowel movements.  No abdominal pain she feels some slight distention and some gurgling.   Did order CT enterography that I have personally reviewed showing evidence of possible stricture within the mid sigmoid colon.  There is evidence of dilated segment proximal to this.     Past Medical History:  Diagnosis Date   Anemia    Arthritis    Asthma    Cancer ([place]) 1996   colon   Chronic kidney disease    Complication of anesthesia    nausea   Diabetes mellitus without complication (HCC)    GERD (gastroesophageal reflux disease)    Glaucoma    Hyperlipidemia    Hypertension    Lumbar disc disease    Small bowel obstruction ([place])    Vitamin B 12 deficiency     Past Surgical History:  Procedure Laterality Date   ABDOMINAL HYSTERECTOMY     BACK SURGERY     COLON SURGERY     COLONOSCOPY WITH PROPOFOL N/A 05/22/2017   Procedure: COLONOSCOPY WITH PROPOFOL;  Surgeon: Manya Silvas, MD;  Location: [facility] ENDOSCOPY;  Service: Endoscopy;  Laterality: N/A;   CYSTOSCOPY WITH STENT PLACEMENT Right 08/02/2020   Procedure: CYSTOSCOPY WITH STENT EXCHANGE;  Surgeon: Abbie Sons, MD;  Location: [facility] ORS;  Service: Urology;  Laterality: Right;   CYSTOSCOPY WITH STENT PLACEMENT Right 06/18/2020   Procedure: CYSTOSCOPY WITH STENT PLACEMENT;  Surgeon: Irine Seal, MD;  Location: [facility] ORS;  Service: Urology;  Laterality: Right;   FRACTURE SURGERY     KNEE ARTHROSCOPY     LAPAROTOMY N/A 06/30/2020   Procedure: EXPLORATORY LAPAROTOMY;  Surgeon: Jules Husbands, MD;  Location:  [facility] ORS;  Service: General;  Laterality: N/A;   LUMBAR LAMINECTOMY     TEMPORARY DIALYSIS CATHETER N/A 06/27/2020   Procedure: TEMPORARY DIALYSIS CATHETER;  Surgeon: Katha Cabal, MD;  Location: [facility];  Service: Cardiovascular;  Laterality: N/A;    Family History  Problem Relation Age of Onset   Breast cancer Neg Hx     Social History:  reports that she has never smoked. She has never used smokeless tobacco. She reports that she does not drink alcohol and does not use drugs.  Allergies:  Allergies  Allergen Reactions   Ibuprofen Itching   Indomethacin    Infliximab    Methotrexate Derivatives    Moexipril    Percocet [Oxycodone-Acetaminophen] Itching   Naprosyn [Naproxen] Rash    Medications reviewed.    ROS Full ROS performed and is otherwise negative other than what is stated in HPI   BP (!) 148/74   Pulse 60   Temp 98.5 F (36.9 C)   Ht 5\' 6"  (1.676 m)   Wt 162 lb (73.5 kg)   SpO2 97%   BMI 26.15 kg/m   Physical Exam  Physical Exam Vitals and nursing note reviewed. Exam conducted with a chaperone present. Constitutional:      General: She is not in acute distress.    Appearance: Normal appearance. She is normal weight.  Comments: Debilitated and chronically ill  Cardiovascular:    Rate and Rhythm: Normal rate and regular rhythm. Pulmonary:    Effort: Pulmonary effort is normal. No respiratory distress.    Breath sounds: No stridor. Abdominal:    General: Abdomen is flat. There is no distension.    Palpations: Abdomen is soft. There is no mass.    Tenderness: There is no abdominal tenderness. There is no guarding.    Hernia: No hernia is present. Prior laparotomy scar. Musculoskeletal:        General: No swelling or tenderness. Normal range of motion.    Cervical back: Normal range of motion and neck supple. No rigidity or tenderness. Skin:    General: Skin is warm and dry.    Coloration: Skin is not  jaundiced. Neurological:    Mental Status: She is alert. Psychiatric:        Mood and Affect: Mood normal.        Behavior: Behavior normal.        Thought Content: Thought content normal.        Judgment: Judgment normal.  Assessment/Plan: 80 year old [name] with history of small bowel obstructions.  Most recently I perform a CT enterography showing evidence of a potential stricture within the sigmoid colon.  Do think that this needs to be further evaluated endoscopically.  We will do appropriate referral for her to have a colonoscopy.  We will see her back after she completes her colonoscopy.  She is otherwise doing okay and there is no need for immediate surgical intervention   Greater than 50% of the 30 minutes  visit was spent in counseling/coordination of care   Caroleen Hamman, MD [facility] [facility]

## 2021-04-05 NOTE — Patient Instructions (Signed)
We will send a referral to [facility] to Dr Allen Norris for a colonoscopy. They will call you to schedule this.   You will follow up here in a couple of months after this is complete.

## 2021-04-05 NOTE — Progress Notes (Signed)
Cath Change/ Replacement  Patient is present today for a catheter change due to urinary retention.  9 ml of water was removed from the balloon, a 16FR foley cath was removed with out difficulty.  Patient was cleaned and prepped in a sterile fashion with betadine. A 16 FR foley cath was replaced into the bladder no complications were noted Urine return was noted 43ml and urine was yellow in color. The balloon was filled with 60ml of sterile water. A leg bag was attached for drainage.  Patient was given proper instruction on catheter care.    Performed by: Zara Council, PA-C  Follow up: Return in about 1 month (around 05/07/2021) for Foley catheter exchange .

## 2021-04-06 ENCOUNTER — Ambulatory Visit (INDEPENDENT_AMBULATORY_CARE_PROVIDER_SITE_OTHER): Payer: Medicare Other | Admitting: Urology

## 2021-04-06 VITALS — Wt 159.0 lb

## 2021-04-06 DIAGNOSIS — Z466 Encounter for fitting and adjustment of urinary device: Secondary | ICD-10-CM

## 2021-04-07 ENCOUNTER — Ambulatory Visit: Payer: Self-pay | Admitting: Urology

## 2021-04-10 ENCOUNTER — Other Ambulatory Visit: Payer: Self-pay

## 2021-04-10 ENCOUNTER — Encounter: Payer: Self-pay | Admitting: *Deleted

## 2021-04-10 ENCOUNTER — Ambulatory Visit
Admission: RE | Admit: 2021-04-10 | Discharge: 2021-04-10 | Disposition: A | Discharge status: Home / Self Care | Payer: Medicare Other | Source: Ambulatory Visit | Attending: Internal Medicine | Admitting: Internal Medicine

## 2021-04-10 DIAGNOSIS — R921 Mammographic calcification found on diagnostic imaging of breast: Secondary | ICD-10-CM

## 2021-04-10 DIAGNOSIS — R928 Other abnormal and inconclusive findings on diagnostic imaging of breast: Secondary | ICD-10-CM | POA: Diagnosis present

## 2021-04-11 ENCOUNTER — Telehealth: Payer: Self-pay

## 2021-04-11 ENCOUNTER — Telehealth: Payer: Self-pay | Admitting: *Deleted

## 2021-04-11 NOTE — Telephone Encounter (Signed)
Patient called and stated that she has an appointment with Dr.Wohl on 06/12/21 for a colonoscopy consult. She wants to know if we can do anything to try to get that appointment moved up sooner. Patient is requesting to speak with a nurse.

## 2021-04-11 NOTE — Telephone Encounter (Signed)
Spoke with patient and let her know that I spoke with Ginger to see if we could get Colonoscopy moved to sooner date and Ginger has placed the patient on the wait list.

## 2021-04-12 ENCOUNTER — Other Ambulatory Visit: Payer: Self-pay | Admitting: Internal Medicine

## 2021-04-12 DIAGNOSIS — R921 Mammographic calcification found on diagnostic imaging of breast: Secondary | ICD-10-CM

## 2021-05-09 ENCOUNTER — Other Ambulatory Visit: Payer: Self-pay

## 2021-05-09 ENCOUNTER — Ambulatory Visit (INDEPENDENT_AMBULATORY_CARE_PROVIDER_SITE_OTHER): Payer: Medicare Other | Admitting: Physician Assistant

## 2021-05-09 DIAGNOSIS — Z466 Encounter for fitting and adjustment of urinary device: Secondary | ICD-10-CM | POA: Diagnosis not present

## 2021-05-09 NOTE — Progress Notes (Signed)
Cath Change/ Replacement  Patient is present today for a catheter change due to urinary retention.  9ml of water was removed from the balloon, a 16FR foley cath was removed without difficulty.  Patient was cleaned and prepped in a sterile fashion with betadine. A 16 FR foley cath was replaced into the bladder no complications were noted Urine return was not noted and patient was counseled to RTC if no urine flow within 2 hours. The balloon was filled with 47ml of sterile water. A leg bag was attached for drainage.  A night bag was also given to the patient. Patient tolerated well.  Performed by: Debroah Loop, PA-C   Follow up: Return in about 4 weeks (around 06/06/2021) for Catheter exchange.

## 2021-06-01 ENCOUNTER — Ambulatory Visit (INDEPENDENT_AMBULATORY_CARE_PROVIDER_SITE_OTHER): Payer: Medicare Other | Admitting: Gastroenterology

## 2021-06-01 ENCOUNTER — Encounter: Payer: Self-pay | Admitting: Gastroenterology

## 2021-06-01 VITALS — HR 77 | Temp 97.9°F | Ht 66.0 in | Wt 161.6 lb

## 2021-06-01 DIAGNOSIS — K56609 Unspecified intestinal obstruction, unspecified as to partial versus complete obstruction: Secondary | ICD-10-CM

## 2021-06-01 MED ORDER — NA SULFATE-K SULFATE-MG SULF 17.5-3.13-1.6 GM/177ML PO SOLN: 1.0000 | Freq: Once | ORAL | 0 refills | Status: AC

## 2021-06-01 NOTE — H&P (View-Only) (Signed)
  Gastroenterology Consultation  Referring Provider:     Pabon, Diego F, MD Primary Care Physician:  Miller, Mark F, MD Primary Gastroenterologist:  Dr. Arminta Gamm     Reason for Consultation:     History of bowel obstruction        HPI:   Stephanie [name] is a 80 y.o. y/o female referred for consultation & management of history of bowel obstruction by Dr. Miller, Mark F, MD. this patient comes in today after having a colonoscopy when she was 80 years old.  At that time the patient was told by Dr. Elliott that she need a repeat colonoscopy in 5 years.  The patient has a history of having a part of her colon removed many years ago due to an abnormal growth.  The patient was in the hospital back in December with a narrowing of the sigmoid colon.  There is no report of any black stools or bloody stools.  She also denies any nausea vomiting fevers or chills at the present time.  She states that she has not lost any weight but she does report that her appetite has been decreased.  There is no report of any further obstruction episodes since being discharged.  The patient was seen by Dr. Pabon and recommended to see me for colonoscopy.  The patient CT scan showed:  IMPRESSION: 1. Marked distention of the mid sigmoid colon up to about 6 cm in diameter. The dilated segment is about 10 cm in length and then relatively abruptly tapers to approximately 3 cm diameter at a point approximately 10 cm proximal to the rectosigmoid anastomosis. Unfortunately, fine detail in this region is largely obscured by beam hardening artifact from the extensive spinal fusion hardware. There appears to be numerous small bowel adhesions in the region of the dilated colon with multiple areas of tethering and small bowel angulation. Findings may be related to adhesions although internal hernia would also be a consideration. This does not appear to be obstructing as there is no small bowel dilatation or colonic dilatation proximal  to this region. The dilated segment of colon has no wall thickening nor edema/inflammation in the fat around it. 2. No intraperitoneal free fluid. 3. Right internal ureteral stent with decompressed urinary bladder. 4. Aortic Atherosclerosis (ICD10-I70.0).    Past Medical History:  Diagnosis Date   Anemia    Arthritis    Asthma    Cancer ([facility]) 1996   colon   Chronic kidney disease    Complication of anesthesia    nausea   Diabetes mellitus without complication (HCC)    GERD (gastroesophageal reflux disease)    Glaucoma    Hyperlipidemia    Hypertension    Lumbar disc disease    Small bowel obstruction (HCC)    Vitamin B 12 deficiency     Past Surgical History:  Procedure Laterality Date   ABDOMINAL HYSTERECTOMY     BACK SURGERY     COLON SURGERY     COLONOSCOPY WITH PROPOFOL N/A 05/22/2017   Procedure: COLONOSCOPY WITH PROPOFOL;  Surgeon: Elliott, Robert T, MD;  Location: [facility] ENDOSCOPY;  Service: Endoscopy;  Laterality: N/A;   CYSTOSCOPY WITH STENT PLACEMENT Right 08/02/2020   Procedure: CYSTOSCOPY WITH STENT EXCHANGE;  Surgeon: Stoioff, Scott C, MD;  Location: [facility] ORS;  Service: Urology;  Laterality: Right;   CYSTOSCOPY WITH STENT PLACEMENT Right 06/18/2020   Procedure: CYSTOSCOPY WITH STENT PLACEMENT;  Surgeon: Wrenn, John, MD;  Location: [facility] ORS;  Service: Urology;  Laterality:   Right;   FRACTURE SURGERY     KNEE ARTHROSCOPY     LAPAROTOMY N/A 06/30/2020   Procedure: EXPLORATORY LAPAROTOMY;  Surgeon: Pabon, Diego F, MD;  Location: [facility] ORS;  Service: General;  Laterality: N/A;   LUMBAR LAMINECTOMY     TEMPORARY DIALYSIS CATHETER N/A 06/27/2020   Procedure: TEMPORARY DIALYSIS CATHETER;  Surgeon: Schnier, Gregory G, MD;  Location: [facility] INVASIVE CV LAB;  Service: Cardiovascular;  Laterality: N/A;    Prior to Admission medications   Medication Sig Start Date End Date Taking? Authorizing Provider  acetaminophen (TYLENOL) 325 MG tablet Take 2 tablets (650 mg total) by mouth  every 6 (six) hours as needed for moderate pain, fever or headache. 07/10/20  Yes Griffith, Kelly A, DO  ascorbic acid (VITAMIN C) 500 MG tablet Take 1 tablet (500 mg total) by mouth daily. 07/10/20  Yes Griffith, Kelly A, DO  brimonidine (ALPHAGAN) 0.2 % ophthalmic solution Place 1 drop into both eyes 2 (two) times daily. 05/31/20  Yes [provider]  CONTOUR TEST test strip 1 each by Other route daily.  03/09/18  Yes [provider]  Cranberry 400 MG CAPS Take 400 mg by mouth daily.  03/24/20  Yes [provider]  diltiazem (DILACOR XR) 120 MG 24 hr capsule Take 120 mg by mouth daily.   Yes [provider]  estradiol (ESTRACE) 0.1 MG/GM vaginal cream Place 1 Applicatorful vaginally at bedtime.   Yes [provider]  glipiZIDE (GLUCOTROL) 10 MG tablet Take 5 mg by mouth 2 (two) times daily before a meal.   Yes [provider]  leflunomide (ARAVA) 10 MG tablet Take 10 mg by mouth every other day.   Yes [provider]  magnesium oxide (MAG-OX) 400 MG tablet Take by mouth.   Yes [provider]  metFORMIN (GLUCOPHAGE-XR) 500 MG 24 hr tablet Take by mouth. 12/07/19  Yes [provider]  metoprolol tartrate (LOPRESSOR) 50 MG tablet Take 50 mg by mouth 2 (two) times daily.   Yes [provider]  Na Sulfate-K Sulfate-Mg Sulf 17.5-3.13-1.6 GM/177ML SOLN Take 1 kit by mouth once for 1 dose. 06/01/21 06/01/21 Yes Tishia Maestre, MD  omeprazole (PRILOSEC) 40 MG capsule Take 40 mg by mouth daily. 09/11/20  Yes [provider]  Probiotic Product (PROBIOTIC-10 PO) Take by mouth.   Yes [provider]  rosuvastatin (CRESTOR) 10 MG tablet Take 10 mg by mouth daily.  03/24/20  Yes [provider]  tiZANidine (ZANAFLEX) 4 MG tablet  09/26/20  Yes [provider]  vitamin B-12 (CYANOCOBALAMIN) 1000 MCG tablet Take 1,000 mcg by mouth daily.   Yes [provider]    Family History  Problem  Relation Age of Onset   Breast cancer Neg Hx      Social History   Tobacco Use   Smoking status: Never   Smokeless tobacco: Never  Vaping Use   Vaping Use: Never used  Substance Use Topics   Alcohol use: No   Drug use: No    Allergies as of 06/01/2021 - Review Complete 04/05/2021  Allergen Reaction Noted   Ibuprofen Itching 05/21/2017   Indomethacin  05/21/2017   Infliximab  05/21/2017   Methotrexate derivatives  05/21/2017   Moexipril  05/21/2017   Percocet [oxycodone-acetaminophen] Itching 05/21/2017   Naprosyn [naproxen] Rash 05/21/2017    Review of Systems:    All systems reviewed and negative except where noted in HPI.   Physical Exam:  Pulse 77   Temp   97.9 F (36.6 C) (Oral)   Ht 5' 6" (1.676 m)   Wt 161 lb 9.6 oz (73.3 kg)   BMI 26.08 kg/m  No LMP recorded. Patient has had a hysterectomy. General:   Alert,  Well-developed, well-nourished, pleasant and cooperative in NAD Head:  Normocephalic and atraumatic. Eyes:  Sclera clear, no icterus.   Conjunctiva pink. Ears:  Normal auditory acuity. Neck:  Supple; no masses or thyromegaly. Lungs:  Respirations even and unlabored.  Clear throughout to auscultation.   No wheezes, crackles, or rhonchi. No acute distress. Heart:  Regular rate and rhythm; no murmurs, clicks, rubs, or gallops. Abdomen:  Normal bowel sounds.  No bruits.  Soft, non-tender and non-distended without masses, hepatosplenomegaly or hernias noted.  No guarding or rebound tenderness.  Negative Carnett sign.   Rectal:  Deferred.  Pulses:  Normal pulses noted. Extremities:  No clubbing or edema.  No cyanosis. Neurologic:  Alert and oriented x3;  grossly normal neurologically. Skin:  Intact without significant lesions or rashes.  No jaundice. Lymph Nodes:  No significant cervical adenopathy. Psych:  Alert and cooperative. Normal mood and affect.  Imaging Studies: No results found.  Assessment and Plan:   Ouita H Lynch is a 80 y.o. y/o female who  comes in today with a history of a bowel obstruction and a CT scan that was abnormal.  The patient was recommended to have a repeat colonoscopy at her last colonoscopy due to polyps and by surgery due to her abnormal CT scan.  The patient will be set up for a colonoscopy.  The patient has been explained the risks and benefits of the procedure and agrees to proceeding with the procedure.    Deonne Rooks, MD. FACG    Note: This dictation was prepared with Dragon dictation along with smaller phrase technology. Any transcriptional errors that result from this process are unintentional.    

## 2021-06-01 NOTE — Progress Notes (Signed)
Gastroenterology Consultation  Referring Provider:     Jules Husbands, MD Primary Care Physician:  Rusty Aus, MD Primary Gastroenterologist:  Dr. Allen Norris     Reason for Consultation:     History of bowel obstruction        HPI:   Stephanie [name] is a 80 y.o. y/o female referred for consultation & management of history of bowel obstruction by Dr. [name] Heck, Christean Grief, MD. this patient comes in today after having a colonoscopy when she was 80 years old.  At that time the patient was told by Dr. Vira Agar that she need a repeat colonoscopy in 5 years.  The patient has a history of having a part of her colon removed many years ago due to an abnormal growth.  The patient was in the hospital back in December with a narrowing of the sigmoid colon.  There is no report of any black stools or bloody stools.  She also denies any nausea vomiting fevers or chills at the present time.  She states that she has not lost any weight but she does report that her appetite has been decreased.  There is no report of any further obstruction episodes since being discharged.  The patient was seen by Dr. Dahlia Byes and recommended to see me for colonoscopy.  The patient CT scan showed:  IMPRESSION: 1. Marked distention of the mid sigmoid colon up to about 6 cm in diameter. The dilated segment is about 10 cm in length and then relatively abruptly tapers to approximately 3 cm diameter at a point approximately 10 cm proximal to the rectosigmoid anastomosis. Unfortunately, fine detail in this region is largely obscured by beam hardening artifact from the extensive spinal fusion hardware. There appears to be numerous small bowel adhesions in the region of the dilated colon with multiple areas of tethering and small bowel angulation. Findings may be related to adhesions although internal hernia would also be a consideration. This does not appear to be obstructing as there is no small bowel dilatation or colonic dilatation proximal  to this region. The dilated segment of colon has no wall thickening nor edema/inflammation in the fat around it. 2. No intraperitoneal free fluid. 3. Right internal ureteral stent with decompressed urinary bladder. 4. Aortic Atherosclerosis (ICD10-I70.0).    Past Medical History:  Diagnosis Date   Anemia    Arthritis    Asthma    Cancer ([place]) 1996   colon   Chronic kidney disease    Complication of anesthesia    nausea   Diabetes mellitus without complication (HCC)    GERD (gastroesophageal reflux disease)    Glaucoma    Hyperlipidemia    Hypertension    Lumbar disc disease    Small bowel obstruction ([place])    Vitamin B 12 deficiency     Past Surgical History:  Procedure Laterality Date   ABDOMINAL HYSTERECTOMY     BACK SURGERY     COLON SURGERY     COLONOSCOPY WITH PROPOFOL N/A 05/22/2017   Procedure: COLONOSCOPY WITH PROPOFOL;  Surgeon: Manya Silvas, MD;  Location: [facility] ENDOSCOPY;  Service: Endoscopy;  Laterality: N/A;   CYSTOSCOPY WITH STENT PLACEMENT Right 08/02/2020   Procedure: CYSTOSCOPY WITH STENT EXCHANGE;  Surgeon: Abbie Sons, MD;  Location: [facility] ORS;  Service: Urology;  Laterality: Right;   CYSTOSCOPY WITH STENT PLACEMENT Right 06/18/2020   Procedure: CYSTOSCOPY WITH STENT PLACEMENT;  Surgeon: Irine Seal, MD;  Location: [facility] ORS;  Service: Urology;  Laterality:  Right;   FRACTURE SURGERY     KNEE ARTHROSCOPY     LAPAROTOMY N/A 06/30/2020   Procedure: EXPLORATORY LAPAROTOMY;  Surgeon: Jules Husbands, MD;  Location: [facility] ORS;  Service: General;  Laterality: N/A;   LUMBAR LAMINECTOMY     TEMPORARY DIALYSIS CATHETER N/A 06/27/2020   Procedure: TEMPORARY DIALYSIS CATHETER;  Surgeon: Katha Cabal, MD;  Location: [place] [facility];  Service: Cardiovascular;  Laterality: N/A;    Prior to Admission medications   Medication Sig Start Date End Date Taking? Authorizing Provider  acetaminophen (TYLENOL) 325 MG tablet Take 2 tablets (650 mg total) by mouth  every 6 (six) hours as needed for moderate pain, fever or headache. 07/10/20  Yes Nicole Kindred A, DO  ascorbic acid (VITAMIN C) 500 MG tablet Take 1 tablet (500 mg total) by mouth daily. 07/10/20  Yes Nicole Kindred A, DO  brimonidine (ALPHAGAN) 0.2 % ophthalmic solution Place 1 drop into both eyes 2 (two) times daily. 05/31/20  Yes [provider]  CONTOUR TEST test strip 1 each by Other route daily.  03/09/18  Yes [provider]  Cranberry 400 MG CAPS Take 400 mg by mouth daily.  03/24/20  Yes [provider]  diltiazem (DILACOR XR) 120 MG 24 hr capsule Take 120 mg by mouth daily.   Yes [provider]  estradiol (ESTRACE) 0.1 MG/GM vaginal cream Place 1 Applicatorful vaginally at bedtime.   Yes [provider]  glipiZIDE (GLUCOTROL) 10 MG tablet Take 5 mg by mouth 2 (two) times daily before a meal.   Yes [provider]  leflunomide (ARAVA) 10 MG tablet Take 10 mg by mouth every other day.   Yes [provider]  magnesium oxide (MAG-OX) 400 MG tablet Take by mouth.   Yes [provider]  metFORMIN (GLUCOPHAGE-XR) 500 MG 24 hr tablet Take by mouth. 12/07/19  Yes [provider]  metoprolol tartrate (LOPRESSOR) 50 MG tablet Take 50 mg by mouth 2 (two) times daily.   Yes [provider]  Na Sulfate-K Sulfate-Mg Sulf 17.5-3.13-1.6 GM/177ML SOLN Take 1 kit by mouth once for 1 dose. 06/01/21 06/01/21 Yes Lucilla Lame, MD  omeprazole (PRILOSEC) 40 MG capsule Take 40 mg by mouth daily. 09/11/20  Yes [provider]  Probiotic Product (PROBIOTIC-10 PO) Take by mouth.   Yes [provider]  rosuvastatin (CRESTOR) 10 MG tablet Take 10 mg by mouth daily.  03/24/20  Yes [provider]  tiZANidine (ZANAFLEX) 4 MG tablet  09/26/20  Yes [provider]  vitamin B-12 (CYANOCOBALAMIN) 1000 MCG tablet Take 1,000 mcg by mouth daily.   Yes [provider]    Family History  Problem  Relation Age of Onset   Breast cancer Neg Hx      Social History   Tobacco Use   Smoking status: Never   Smokeless tobacco: Never  Vaping Use   Vaping Use: Never used  Substance Use Topics   Alcohol use: No   Drug use: No    Allergies as of 06/01/2021 - Review Complete 04/05/2021  Allergen Reaction Noted   Ibuprofen Itching 05/21/2017   Indomethacin  05/21/2017   Infliximab  05/21/2017   Methotrexate derivatives  05/21/2017   Moexipril  05/21/2017   Percocet [oxycodone-acetaminophen] Itching 05/21/2017   Naprosyn [naproxen] Rash 05/21/2017    Review of Systems:    All systems reviewed and negative except where noted in HPI.   Physical Exam:  Pulse 77   Temp  97.9 F (36.6 C) (Oral)   Ht 5' 6" (1.676 m)   Wt 161 lb 9.6 oz (73.3 kg)   BMI 26.08 kg/m  No LMP recorded. Patient has had a hysterectomy. General:   Alert,  Well-developed, well-nourished, pleasant and cooperative in NAD Head:  Normocephalic and atraumatic. Eyes:  Sclera clear, no icterus.   Conjunctiva pink. Ears:  Normal auditory acuity. Neck:  Supple; no masses or thyromegaly. Lungs:  Respirations even and unlabored.  Clear throughout to auscultation.   No wheezes, crackles, or rhonchi. No acute distress. Heart:  Regular rate and rhythm; no murmurs, clicks, rubs, or gallops. Abdomen:  Normal bowel sounds.  No bruits.  Soft, non-tender and non-distended without masses, hepatosplenomegaly or hernias noted.  No guarding or rebound tenderness.  Negative Carnett sign.   Rectal:  Deferred.  Pulses:  Normal pulses noted. Extremities:  No clubbing or edema.  No cyanosis. Neurologic:  Alert and oriented x3;  grossly normal neurologically. Skin:  Intact without significant lesions or rashes.  No jaundice. Lymph Nodes:  No significant cervical adenopathy. Psych:  Alert and cooperative. Normal mood and affect.  Imaging Studies: No results found.  Assessment and Plan:   STEPHANYE FINNICUM is a 63 y.o. y/o female who  comes in today with a history of a bowel obstruction and a CT scan that was abnormal.  The patient was recommended to have a repeat colonoscopy at her last colonoscopy due to polyps and by surgery due to her abnormal CT scan.  The patient will be set up for a colonoscopy.  The patient has been explained the risks and benefits of the procedure and agrees to proceeding with the procedure.    Lucilla Lame, MD. Marval Regal    Note: This dictation was prepared with Dragon dictation along with smaller phrase technology. Any transcriptional errors that result from this process are unintentional.

## 2021-06-05 NOTE — Progress Notes (Signed)
Cath Change/ Replacement  Patient is present today for a catheter change due to urinary retention.  9 ml of water was removed from the balloon, a 16FR foley cath was removed with out difficulty.  Patient was cleaned and prepped in a sterile fashion with betadine. A 16 FR foley cath was replaced into the bladder no complications were noted Urine return was noted 53ml and urine was yellow in color. The balloon was filled with 76ml of sterile water. A leg bag was attached for drainage.  Patient was given proper instruction on catheter care.    Performed by: Zara Council, PA-C  Follow up: Keep follow up on 07/06/2021 for catheter exchange, UA and urine culture

## 2021-06-06 ENCOUNTER — Other Ambulatory Visit: Payer: Self-pay

## 2021-06-06 ENCOUNTER — Ambulatory Visit (INDEPENDENT_AMBULATORY_CARE_PROVIDER_SITE_OTHER): Payer: Medicare Other | Admitting: Urology

## 2021-06-06 DIAGNOSIS — Z466 Encounter for fitting and adjustment of urinary device: Secondary | ICD-10-CM

## 2021-06-07 ENCOUNTER — Ambulatory Visit: Payer: Medicare Other | Admitting: Gastroenterology

## 2021-06-08 ENCOUNTER — Ambulatory Visit: Payer: Medicare Other | Admitting: Anesthesiology

## 2021-06-08 ENCOUNTER — Encounter: Payer: Self-pay | Admitting: Gastroenterology

## 2021-06-08 ENCOUNTER — Encounter
Admission: RE | Disposition: A | Discharge status: Home / Self Care | Payer: Self-pay | Source: Home / Self Care | Attending: Gastroenterology

## 2021-06-08 ENCOUNTER — Other Ambulatory Visit: Payer: Self-pay

## 2021-06-08 ENCOUNTER — Ambulatory Visit
Admission: RE | Admit: 2021-06-08 | Discharge: 2021-06-08 | Disposition: A | Discharge status: Home / Self Care | Payer: Medicare Other | Attending: Gastroenterology | Admitting: Gastroenterology

## 2021-06-08 DIAGNOSIS — Z886 Allergy status to analgesic agent status: Secondary | ICD-10-CM | POA: Insufficient documentation

## 2021-06-08 DIAGNOSIS — Z85038 Personal history of other malignant neoplasm of large intestine: Secondary | ICD-10-CM | POA: Insufficient documentation

## 2021-06-08 DIAGNOSIS — I129 Hypertensive chronic kidney disease with stage 1 through stage 4 chronic kidney disease, or unspecified chronic kidney disease: Secondary | ICD-10-CM | POA: Insufficient documentation

## 2021-06-08 DIAGNOSIS — K56609 Unspecified intestinal obstruction, unspecified as to partial versus complete obstruction: Secondary | ICD-10-CM

## 2021-06-08 DIAGNOSIS — Z90710 Acquired absence of both cervix and uterus: Secondary | ICD-10-CM | POA: Insufficient documentation

## 2021-06-08 DIAGNOSIS — H409 Unspecified glaucoma: Secondary | ICD-10-CM | POA: Insufficient documentation

## 2021-06-08 DIAGNOSIS — N189 Chronic kidney disease, unspecified: Secondary | ICD-10-CM | POA: Diagnosis not present

## 2021-06-08 DIAGNOSIS — Z885 Allergy status to narcotic agent status: Secondary | ICD-10-CM | POA: Diagnosis not present

## 2021-06-08 DIAGNOSIS — E1122 Type 2 diabetes mellitus with diabetic chronic kidney disease: Secondary | ICD-10-CM | POA: Diagnosis not present

## 2021-06-08 DIAGNOSIS — K635 Polyp of colon: Secondary | ICD-10-CM | POA: Diagnosis not present

## 2021-06-08 DIAGNOSIS — E785 Hyperlipidemia, unspecified: Secondary | ICD-10-CM | POA: Insufficient documentation

## 2021-06-08 DIAGNOSIS — D124 Benign neoplasm of descending colon: Secondary | ICD-10-CM | POA: Diagnosis not present

## 2021-06-08 DIAGNOSIS — R933 Abnormal findings on diagnostic imaging of other parts of digestive tract: Secondary | ICD-10-CM | POA: Diagnosis not present

## 2021-06-08 DIAGNOSIS — Z888 Allergy status to other drugs, medicaments and biological substances status: Secondary | ICD-10-CM | POA: Diagnosis not present

## 2021-06-08 DIAGNOSIS — Z7984 Long term (current) use of oral hypoglycemic drugs: Secondary | ICD-10-CM | POA: Diagnosis not present

## 2021-06-08 DIAGNOSIS — K219 Gastro-esophageal reflux disease without esophagitis: Secondary | ICD-10-CM | POA: Diagnosis not present

## 2021-06-08 DIAGNOSIS — Z79899 Other long term (current) drug therapy: Secondary | ICD-10-CM | POA: Insufficient documentation

## 2021-06-08 HISTORY — DX: Retention of urine, unspecified: R33.9

## 2021-06-08 HISTORY — DX: Dependence on other enabling machines and devices: Z99.89

## 2021-06-08 HISTORY — PX: POLYPECTOMY: SHX5525

## 2021-06-08 HISTORY — DX: Presence of external hearing-aid: Z97.4

## 2021-06-08 HISTORY — PX: COLONOSCOPY WITH PROPOFOL: SHX5780

## 2021-06-08 HISTORY — DX: Presence of other specified devices: Z97.8

## 2021-06-08 LAB — GLUCOSE, CAPILLARY
Glucose-Capillary: 238 mg/dL — ABNORMAL HIGH (ref 70–99)
Glucose-Capillary: 219 mg/dL — ABNORMAL HIGH (ref 70–99)

## 2021-06-08 SURGERY — COLONOSCOPY WITH PROPOFOL
Anesthesia: General

## 2021-06-08 MED ORDER — LACTATED RINGERS IV SOLN: INTRAVENOUS | Status: DC

## 2021-06-08 MED ORDER — ACETAMINOPHEN 160 MG/5ML PO SOLN: 975.0000 mg | Freq: Once | ORAL | Status: DC | PRN

## 2021-06-08 MED ORDER — PROPOFOL 10 MG/ML IV BOLUS
INTRAVENOUS | Status: DC | PRN
  Administered 2021-06-08 (×2): 30 mg via INTRAVENOUS
  Administered 2021-06-08: 20 mg via INTRAVENOUS
  Administered 2021-06-08: 30 mg via INTRAVENOUS
  Administered 2021-06-08 (×2): 20 mg via INTRAVENOUS
  Administered 2021-06-08: 100 mg via INTRAVENOUS

## 2021-06-08 MED ORDER — ACETAMINOPHEN 500 MG PO TABS: 1000.0000 mg | ORAL_TABLET | Freq: Once | ORAL | Status: DC | PRN

## 2021-06-08 MED ORDER — LIDOCAINE HCL (CARDIAC) PF 100 MG/5ML IV SOSY
PREFILLED_SYRINGE | INTRAVENOUS | Status: DC | PRN
  Administered 2021-06-08: 30 mg via INTRAVENOUS

## 2021-06-08 MED ORDER — ONDANSETRON HCL 4 MG/2ML IJ SOLN: 4.0000 mg | Freq: Once | INTRAMUSCULAR | Status: DC | PRN

## 2021-06-08 MED ORDER — SODIUM CHLORIDE 0.9 % IV SOLN: INTRAVENOUS | Status: DC

## 2021-06-08 MED ORDER — STERILE WATER FOR IRRIGATION IR SOLN: Status: DC | PRN

## 2021-06-08 SURGICAL SUPPLY — 22 items
CLIP HMST 235XBRD CATH ROT (MISCELLANEOUS) IMPLANT
CLIP RESOLUTION 360 11X235 (MISCELLANEOUS)
ELECT REM PT RETURN 9FT ADLT (ELECTROSURGICAL)
ELECTRODE REM PT RTRN 9FT ADLT (ELECTROSURGICAL) IMPLANT
FORCEPS BIOP RAD 4 LRG CAP 4 (CUTTING FORCEPS) IMPLANT
GOWN CVR UNV OPN BCK APRN NK (MISCELLANEOUS) ×4 IMPLANT
GOWN ISOL THUMB LOOP REG UNIV (MISCELLANEOUS) ×6
INJECTOR VARIJECT VIN23 (MISCELLANEOUS) IMPLANT
KIT DEFENDO VALVE AND CONN (KITS) IMPLANT
KIT PRC NS LF DISP ENDO (KITS) ×2 IMPLANT
KIT PROCEDURE OLYMPUS (KITS) ×3
MANIFOLD NEPTUNE II (INSTRUMENTS) ×3 IMPLANT
MARKER SPOT ENDO TATTOO 5ML (MISCELLANEOUS) IMPLANT
PROBE APC STR FIRE (PROBE) IMPLANT
RETRIEVER NET ROTH 2.5X230 LF (MISCELLANEOUS) IMPLANT
SNARE COLD EXACTO (MISCELLANEOUS) ×3 IMPLANT
SNARE SHORT THROW 13M SML OVAL (MISCELLANEOUS) IMPLANT
SNARE SNG USE RND 15MM (INSTRUMENTS) IMPLANT
SPOT EX ENDOSCOPIC TATTOO (MISCELLANEOUS)
TRAP ETRAP POLY (MISCELLANEOUS) ×3 IMPLANT
VARIJECT INJECTOR VIN23 (MISCELLANEOUS)
WATER STERILE IRR 250ML POUR (IV SOLUTION) ×3 IMPLANT

## 2021-06-08 NOTE — Anesthesia Preprocedure Evaluation (Signed)
Anesthesia Evaluation  Patient identified by MRN, date of birth, ID band Patient awake    Reviewed: Allergy & Precautions, H&P , NPO status , Patient's Chart, lab work & pertinent test results, reviewed documented beta blocker date and time   Airway Mallampati: II  TM Distance: >3 FB Neck ROM: full    Dental no notable dental hx.    Pulmonary asthma ,    Pulmonary exam normal breath sounds clear to auscultation       Cardiovascular Exercise Tolerance: Good hypertension, negative cardio ROS   Rhythm:regular Rate:Normal     Neuro/Psych negative neurological ROS  negative psych ROS   GI/Hepatic Neg liver ROS, GERD  ,  Endo/Other  negative endocrine ROSdiabetes  Renal/GU CRFRenal diseaseHistory of renal failure in setting of sepsis, recovered  negative genitourinary   Musculoskeletal   Abdominal   Peds  Hematology  (+) Blood dyscrasia, anemia ,   Anesthesia Other Findings   Reproductive/Obstetrics negative OB ROS                            Anesthesia Physical Anesthesia Plan  ASA: 3  Anesthesia Plan: General   Post-op Pain Management:    Induction:   PONV Risk Score and Plan: 3 and Propofol infusion and Ondansetron  Airway Management Planned:   Additional Equipment:   Intra-op Plan:   Post-operative Plan:   Informed Consent: I have reviewed the patients History and Physical, chart, labs and discussed the procedure including the risks, benefits and alternatives for the proposed anesthesia with the patient or authorized representative who has indicated his/her understanding and acceptance.     Dental Advisory Given  Plan Discussed with: CRNA  Anesthesia Plan Comments:        Anesthesia Quick Evaluation

## 2021-06-08 NOTE — Transfer of Care (Signed)
Immediate Anesthesia Transfer of Care Note  Patient: Stephanie [name]  Procedure(s) Performed: COLONOSCOPY WITH PROPOFOL POLYPECTOMY  Patient Location: PACU  Anesthesia Type: General  Level of Consciousness: awake, alert  and patient cooperative  Airway and Oxygen Therapy: Patient Spontanous Breathing and Patient connected to supplemental oxygen  Post-op Assessment: Post-op Vital signs reviewed, Patient's Cardiovascular Status Stable, Respiratory Function Stable, Patent Airway and No signs of Nausea or vomiting  Post-op Vital Signs: Reviewed and stable  Complications: No notable events documented.

## 2021-06-08 NOTE — Op Note (Signed)
[facility] Gastroenterology Patient Name: Stephanie [name] Procedure Date: 06/08/2021 8:50 AM MRN: [id number] Account #: [id number] Date of Birth: March 12, 1941 Admit Type: Outpatient Age: 80 Room: [facility] OR ROOM 01 Gender: Female Note Status: Finalized Instrument Name: [id number] Procedure:             Colonoscopy Indications:           Abnormal CT of the GI tract Providers:             Lucilla Lame MD, MD Referring MD:          Rusty Aus, MD (Referring MD) Medicines:             Propofol per Anesthesia Complications:         No immediate complications. Procedure:             Pre-Anesthesia Assessment:                        - Prior to the procedure, a History and Physical was                         performed, and patient medications and allergies were                         reviewed. The patient's tolerance of previous                         anesthesia was also reviewed. The risks and benefits                         of the procedure and the sedation options and risks                         were discussed with the patient. All questions were                         answered, and informed consent was obtained. Prior                         Anticoagulants: The patient has taken no previous                         anticoagulant or antiplatelet agents. ASA Grade                         Assessment: II - A patient with mild systemic disease.                         After reviewing the risks and benefits, the patient                         was deemed in satisfactory condition to undergo the                         procedure.                        After obtaining informed consent, the colonoscope was  passed under direct vision. Throughout the procedure,                         the patient's blood pressure, pulse, and oxygen                         saturations were monitored continuously. The was                         introduced through the anus  and advanced to the the                         cecum, identified by appendiceal orifice and ileocecal                         valve. The colonoscopy was performed without                         difficulty. The patient tolerated the procedure well.                         The quality of the bowel preparation was excellent. Findings:      The perianal and digital rectal examinations were normal.      There was evidence of a prior end-to-end colo-colonic anastomosis in the       sigmoid colon. This was patent and was characterized by healthy       appearing mucosa. The anastomosis was traversed.      A 5 mm polyp was found in the descending colon. The polyp was sessile.       The polyp was removed with a cold snare. Resection and retrieval were       complete. Impression:            - Patent end-to-end colo-colonic anastomosis,                         characterized by healthy appearing mucosa.                        - One 5 mm polyp in the descending colon, removed with                         a cold snare. Resected and retrieved. Recommendation:        - Discharge patient to home.                        - Resume previous diet.                        - Continue present medications.                        - Await pathology results.                        - Repeat colonoscopy is not recommended due to current                         age (81 years or older) for surveillance. Procedure Code(s):     ---  Professional ---                        [phone], Colonoscopy, flexible; with removal of                         tumor(s), polyp(s), or other lesion(s) by snare                         technique Diagnosis Code(s):     --- Professional ---                        R93.3, Abnormal findings on diagnostic imaging of                         other parts of digestive tract                        K63.5, Polyp of colon CPT copyright 2019 American Medical Association. All rights reserved. The codes  documented in this report are preliminary and upon coder review may  be revised to meet current compliance requirements. Lucilla Lame MD, MD 06/08/2021 9:12:20 AM This report has been signed electronically. Number of Addenda: 0 Note Initiated On: 06/08/2021 8:50 AM Scope Withdrawal Time: 0 hours 5 minutes 58 seconds  Total Procedure Duration: 0 hours 10 minutes 32 seconds  Estimated Blood Loss:  Estimated blood loss: none.      [facility]

## 2021-06-08 NOTE — Anesthesia Postprocedure Evaluation (Signed)
Anesthesia Post Note  Patient: Stephanie [name]  Procedure(s) Performed: COLONOSCOPY WITH PROPOFOL POLYPECTOMY     Patient location during evaluation: PACU Anesthesia Type: General Level of consciousness: awake and alert Pain management: pain level controlled Vital Signs Assessment: post-procedure vital signs reviewed and stable Respiratory status: spontaneous breathing, nonlabored ventilation and respiratory function stable Cardiovascular status: blood pressure returned to baseline and stable Postop Assessment: no apparent nausea or vomiting Anesthetic complications: no   No notable events documented.  April Manson

## 2021-06-08 NOTE — Interval H&P Note (Signed)
Lucilla Lame, MD [address] [place], [place] [address] Phone:[phone] Fax : [phone]  Primary Care Physician:  Rusty Aus, MD Primary Gastroenterologist:  Dr. Allen Norris  Pre-Procedure History & Physical: HPI:  Stephanie [name] is a 80 y.o. female is here for an colonoscopy.   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    has, does not wear    Past Surgical History:  Procedure Laterality Date   ABDOMINAL HYSTERECTOMY     BACK SURGERY     x5  "screws and rods"   COLON SURGERY     COLONOSCOPY WITH PROPOFOL N/A 05/22/2017   Procedure: COLONOSCOPY WITH PROPOFOL;  Surgeon: Manya Silvas, MD;  Location: [facility] ENDOSCOPY;  Service: Endoscopy;  Laterality: N/A;   CYSTOSCOPY WITH STENT PLACEMENT Right 08/02/2020   Procedure: CYSTOSCOPY WITH STENT EXCHANGE;  Surgeon: Abbie Sons, MD;  Location: [facility] ORS;  Service: Urology;  Laterality: Right;   CYSTOSCOPY WITH STENT PLACEMENT Right 06/18/2020   Procedure: CYSTOSCOPY WITH STENT PLACEMENT;  Surgeon: Irine Seal, MD;  Location: [facility] ORS;  Service: Urology;  Laterality: Right;   FRACTURE SURGERY     KNEE ARTHROSCOPY     LAPAROTOMY N/A 06/30/2020   Procedure: EXPLORATORY LAPAROTOMY;  Surgeon: Jules Husbands, MD;  Location: [facility] ORS;  Service: General;  Laterality: N/A;   LUMBAR LAMINECTOMY     TEMPORARY DIALYSIS CATHETER N/A 06/27/2020   Procedure: TEMPORARY DIALYSIS CATHETER;  Surgeon: Katha Cabal, MD;  Location: [place] [facility];  Service: Cardiovascular;   Laterality: N/A;    Prior to Admission medications   Medication Sig Start Date End Date Taking? Authorizing Provider  acetaminophen (TYLENOL) 325 MG tablet Take 2 tablets (650 mg total) by mouth every 6 (six) hours as needed for moderate pain, fever or headache. 07/10/20  Yes Nicole Kindred A, DO  ascorbic acid (VITAMIN C) 500 MG tablet Take 1 tablet (500 mg total) by mouth daily. 07/10/20  Yes Nicole Kindred A, DO  brimonidine (ALPHAGAN) 0.2 % ophthalmic solution Place 1 drop into both eyes 2 (two) times daily. 05/31/20  Yes [provider]  Cranberry 400 MG CAPS Take 400 mg by mouth daily.  03/24/20  Yes [provider]  diltiazem (DILACOR XR) 120 MG 24 hr capsule Take 120 mg by mouth daily.   Yes [provider]  estradiol (ESTRACE) 0.1 MG/GM vaginal cream Place 1 Applicatorful vaginally at bedtime.   Yes [provider]  glipiZIDE (GLUCOTROL) 10 MG tablet Take 5 mg by mouth 2 (two) times daily before a meal.   Yes [provider]  leflunomide (ARAVA) 10 MG tablet Take 10 mg by mouth every other day.   Yes [provider]  magnesium oxide (MAG-OX) 400 MG tablet Take by mouth.   Yes [provider]  metFORMIN (GLUCOPHAGE-XR) 500 MG 24 hr tablet Take by  mouth. 12/07/19  Yes [provider]  metoprolol tartrate (LOPRESSOR) 50 MG tablet Take 50 mg by mouth 2 (two) times daily.   Yes [provider]  omeprazole (PRILOSEC) 40 MG capsule Take 40 mg by mouth daily. 09/11/20  Yes [provider]  Probiotic Product (PROBIOTIC-10 PO) Take by mouth.   Yes [provider]  rosuvastatin (CRESTOR) 10 MG tablet Take 10 mg by mouth daily.  03/24/20  Yes [provider]  vitamin B-12 (CYANOCOBALAMIN) 1000 MCG tablet Take 1,000 mcg by mouth daily.   Yes [provider]  CONTOUR TEST test strip 1 each by Other route daily.  03/09/18   [provider]  tiZANidine (ZANAFLEX) 4 MG tablet   09/26/20   [provider]    Allergies as of 06/01/2021 - Review Complete 06/01/2021  Allergen Reaction Noted   Ibuprofen Itching 05/21/2017   Indomethacin  05/21/2017   Infliximab  05/21/2017   Methotrexate derivatives  05/21/2017   Moexipril  05/21/2017   Percocet [oxycodone-acetaminophen] Itching 05/21/2017   Naprosyn [naproxen] Rash 05/21/2017    Family History  Problem Relation Age of Onset   Breast cancer Neg Hx     Social History   Socioeconomic History   Marital status: Married    Spouse name: Not on file   Number of children: Not on file   Years of education: Not on file   Highest education level: Not on file  Occupational History   Not on file  Tobacco Use   Smoking status: Never   Smokeless tobacco: Never  Vaping Use   Vaping Use: Never used  Substance and Sexual Activity   Alcohol use: No   Drug use: No   Sexual activity: Yes  Other Topics Concern   Not on file  Social History Narrative   Not on file   Social Determinants of Health   Financial Resource Strain: Not on file  Food Insecurity: Not on file  Transportation Needs: Not on file  Physical Activity: Not on file  Stress: Not on file  Social Connections: Not on file  Intimate Partner Violence: Not on file    Review of Systems: See HPI, otherwise negative ROS  Physical Exam: BP (!) 151/74   Pulse 73   Temp (!) 97.3 F (36.3 C) (Temporal)   Ht 5\' 6"  (1.676 m)   Wt 73 kg   SpO2 97%   BMI 25.99 kg/m  General:   Alert,  pleasant and cooperative in NAD Head:  Normocephalic and atraumatic. Neck:  Supple; no masses or thyromegaly. Lungs:  Clear throughout to auscultation.    Heart:  Regular rate and rhythm. Abdomen:  Soft, nontender and nondistended. Normal bowel sounds, without guarding, and without rebound.   Neurologic:  Alert and  oriented x4;  grossly normal neurologically.  Impression/Plan: Stephanie [name] is here for an colonoscopy to be performed for abnormal CT  scan  Risks, benefits, limitations, and alternatives regarding  colonoscopy have been reviewed with the patient.  Questions have been answered.  All parties agreeable.   Lucilla Lame, MD  06/08/2021, 8:18 AM

## 2021-06-08 NOTE — Anesthesia Procedure Notes (Signed)
Date/Time: 06/08/2021 8:55 AM Performed by: Cameron Ali, CRNA Pre-anesthesia Checklist: Patient identified, Emergency Drugs available, Suction available, Timeout performed and Patient being monitored Patient Re-evaluated:Patient Re-evaluated prior to induction Oxygen Delivery Method: Nasal cannula Placement Confirmation: positive ETCO2

## 2021-06-09 ENCOUNTER — Encounter: Payer: Self-pay | Admitting: Gastroenterology

## 2021-06-09 LAB — SURGICAL PATHOLOGY

## 2021-06-12 ENCOUNTER — Encounter: Payer: Self-pay | Admitting: Gastroenterology

## 2021-06-12 ENCOUNTER — Other Ambulatory Visit: Payer: Self-pay

## 2021-06-12 ENCOUNTER — Ambulatory Visit (INDEPENDENT_AMBULATORY_CARE_PROVIDER_SITE_OTHER): Payer: Medicare Other | Admitting: Surgery

## 2021-06-12 ENCOUNTER — Encounter: Payer: Self-pay | Admitting: Surgery

## 2021-06-12 VITALS — BP 151/85 | HR 74 | Temp 98.3°F | Ht 68.0 in | Wt 161.4 lb

## 2021-06-12 DIAGNOSIS — K56609 Unspecified intestinal obstruction, unspecified as to partial versus complete obstruction: Secondary | ICD-10-CM | POA: Diagnosis not present

## 2021-06-12 NOTE — Progress Notes (Signed)
Outpatient Surgical Follow Up  06/12/2021  Stephanie [name] is an 80 y.o. female.   Chief Complaint  Patient presents with   Routine Post Op    SBO    HPI: Stephanie [name] is a 80 year old female well-known to me with prior history of exploratory laparotomy for small bowel obstructions.  She did have a recent recurrent requiring hospitalization.  This resolved with medical management.  I did perform a CT enterography that I personally reviewed showing evidence of nonspecific changes within the small bowel without definitive mechanical processes that would warrant further intervention. She feels stronger.  I still uses a walker mainly for balance but she has regained her strength.  Past Medical History:  Diagnosis Date   Anemia    Arthritis    Asthma    Cancer ([place]) 1996   colon   Chronic kidney disease    Complication of anesthesia    nausea   Diabetes mellitus without complication ([place])    Foley catheter in place    GERD (gastroesophageal reflux disease)    Glaucoma    Hyperlipidemia    Hypertension    Lumbar disc disease    Small bowel obstruction ([place])    Urinary retention    Uses walker    Vitamin B 12 deficiency    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    has, does not wear    Past Surgical History:  Procedure Laterality Date   ABDOMINAL HYSTERECTOMY     BACK SURGERY     x5  "screws and rods"   COLON SURGERY     COLONOSCOPY WITH PROPOFOL N/A 05/22/2017   Procedure: COLONOSCOPY WITH PROPOFOL;  Surgeon: Manya Silvas, MD;  Location: [facility];  Service: Endoscopy;  Laterality: N/A;   COLONOSCOPY WITH PROPOFOL N/A 06/08/2021   Procedure: COLONOSCOPY WITH PROPOFOL;  Surgeon: Lucilla Lame, MD;  Location: [place];  Service: Endoscopy;  Laterality: N/A;  Diabetic   CYSTOSCOPY WITH STENT PLACEMENT Right 08/02/2020   Procedure: CYSTOSCOPY WITH STENT EXCHANGE;  Surgeon: Abbie Sons, MD;  Location: [facility];  Service: Urology;  Laterality: Right;   CYSTOSCOPY WITH  STENT PLACEMENT Right 06/18/2020   Procedure: CYSTOSCOPY WITH STENT PLACEMENT;  Surgeon: Irine Seal, MD;  Location: [facility];  Service: Urology;  Laterality: Right;   FRACTURE SURGERY     KNEE ARTHROSCOPY     LAPAROTOMY N/A 06/30/2020   Procedure: EXPLORATORY LAPAROTOMY;  Surgeon: Jules Husbands, MD;  Location: [facility];  Service: General;  Laterality: N/A;   LUMBAR LAMINECTOMY     POLYPECTOMY  06/08/2021   Procedure: POLYPECTOMY;  Surgeon: Lucilla Lame, MD;  Location: [facility];  Service: Endoscopy;;   TEMPORARY DIALYSIS CATHETER N/A 06/27/2020   Procedure: TEMPORARY DIALYSIS CATHETER;  Surgeon: Katha Cabal, MD;  Location: [place] [facility];  Service: Cardiovascular;  Laterality: N/A;    Family History  Problem Relation Age of Onset   Breast cancer Neg Hx     Social History:  reports that she has never smoked. She has never used smokeless tobacco. She reports that she does not drink alcohol and does not use drugs.  Allergies:  Allergies  Allergen Reactions   Ibuprofen Itching   Indomethacin    Infliximab    Methotrexate Derivatives    Moexipril    Percocet [Oxycodone-Acetaminophen] Itching   Naprosyn [Naproxen] Rash    Medications reviewed.    ROS Full ROS performed and is otherwise negative other than what is stated in  HPI   BP (!) 151/85   Pulse 74   Temp 98.3 F (36.8 C) (Oral)   Ht 5\' 8"  (1.727 m)   Wt 161 lb 6.4 oz (73.2 kg)   SpO2 95%   BMI 24.54 kg/m   Physical Exam Vitals and nursing note reviewed. Exam conducted with a chaperone present.  Constitutional:      Appearance: Normal appearance. She is normal weight.  Eyes:     General:        Right eye: No discharge.        Left eye: No discharge.  Cardiovascular:     Rate and Rhythm: Normal rate and regular rhythm.     Heart sounds: No murmur heard. Pulmonary:     Effort: Pulmonary effort is normal. No respiratory distress.     Breath sounds: Normal breath sounds. No stridor.   Abdominal:     General: Abdomen is flat. There is no distension.     Palpations: Abdomen is soft. There is no mass.     Tenderness: There is no abdominal tenderness. There is no guarding or rebound.     Hernia: No hernia is present.  Musculoskeletal:        General: No swelling or tenderness. Normal range of motion.     Cervical back: Normal range of motion and neck supple. No rigidity.  Skin:    General: Skin is warm and dry.     Capillary Refill: Capillary refill takes less than 2 seconds.  Neurological:     General: No focal deficit present.     Mental Status: She is alert and oriented to person, place, and time.  Psychiatric:        Mood and Affect: Mood normal.        Behavior: Behavior normal.        Thought Content: Thought content normal.        Judgment: Judgment normal.      Assessment/Plan: Stephanie [name] is a 80 year old female with a prior history of recurrent small bowel at this time but no evidence of definitive three-point pathology within the small bowel that we will suggest that surgical intervention beneficial.  Therefore I do not recommend any surgical invention at this time.  Greater than 50% of the 20 minutes  visit was spent in counseling/coordination of care   Caroleen Hamman, MD [facility] [facility] Surgeon

## 2021-06-12 NOTE — Patient Instructions (Signed)
Please call the office with any questions or concerns.

## 2021-07-05 NOTE — H&P (View-Only) (Signed)
07/06/2021 5:51 PM   [name] 1941-05-23 [id number]  Referring provider: Rusty Aus, MD [place] [place],  [place] [address]  Chief Complaint  Patient presents with   Urinary Retention    Follow up cath exchange w/KUB    Urological history: 1. 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 rUTI's - contributing factors of age, indwelling Foley and vaginal atrophy - documented positive urine cultures over the last year  E.coli and AEROCOCCUS SPECIES in May 2022   3. 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 female who presents today with her husband, Stephanie [name], for a Foley catheter exchange, urinalysis, urine culture and KUB in preparation of ureteral stent exchange.  Urine sent for culture.    KUB right ureteral stent in place.   She has had no issues with UTI's and/or right flank pain.  Patient denies any modifying or aggravating factors.  Patient denies any gross hematuria, dysuria or suprapubic/flank pain.  Patient denies any fevers, chills, nausea or vomiting.    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    has, does not wear    Surgical History: Past Surgical History:  Procedure Laterality Date   ABDOMINAL HYSTERECTOMY     BACK SURGERY     x5  "screws and rods"   COLON SURGERY     COLONOSCOPY WITH PROPOFOL N/A 05/22/2017   Procedure: COLONOSCOPY WITH PROPOFOL;  Surgeon:  Manya Silvas, MD;  Location: [facility];  Service: Endoscopy;  Laterality: N/A;   COLONOSCOPY WITH PROPOFOL N/A 06/08/2021   Procedure: COLONOSCOPY WITH PROPOFOL;  Surgeon: Lucilla Lame, MD;  Location: [place];  Service: Endoscopy;  Laterality: N/A;  Diabetic   CYSTOSCOPY WITH STENT PLACEMENT Right 08/02/2020   Procedure: CYSTOSCOPY WITH STENT EXCHANGE;  Surgeon: Abbie Sons, MD;  Location: [facility];  Service: Urology;  Laterality: Right;   CYSTOSCOPY WITH STENT PLACEMENT Right 06/18/2020   Procedure: CYSTOSCOPY WITH STENT PLACEMENT;  Surgeon: Irine Seal, MD;  Location: [facility];  Service: Urology;  Laterality: Right;   FRACTURE SURGERY     KNEE ARTHROSCOPY     LAPAROTOMY N/A 06/30/2020   Procedure: EXPLORATORY LAPAROTOMY;  Surgeon: Jules Husbands, MD;  Location: [facility];  Service: General;  Laterality: N/A;   LUMBAR LAMINECTOMY     POLYPECTOMY  06/08/2021   Procedure: POLYPECTOMY;  Surgeon: Lucilla Lame, MD;  Location: [place];  Service: Endoscopy;;   TEMPORARY DIALYSIS CATHETER N/A 06/27/2020   Procedure: TEMPORARY DIALYSIS CATHETER;  Surgeon: Katha Cabal, MD;  Location: [place] [facility];  Service: Cardiovascular;  Laterality: N/A;    Home Medications:  Allergies as of 07/06/2021       Reactions   Ibuprofen Itching  Indomethacin    Infliximab    Methotrexate Derivatives    Moexipril    Percocet [oxycodone-acetaminophen] Itching   Naprosyn [naproxen] Rash        Medication List        Accurate as of July 06, 2021 11:59 PM. If you have any questions, ask your nurse or doctor.          acetaminophen 325 MG tablet Commonly known as: TYLENOL Take 2 tablets (650 mg total) by mouth every 6 (six) hours as needed for moderate pain, fever or headache.   ascorbic acid 500 MG tablet Commonly known as: VITAMIN C Take 1 tablet (500 mg total) by mouth daily.   brimonidine 0.2 % ophthalmic solution Commonly known as:  ALPHAGAN Place 1 drop into both eyes 2 (two) times daily.   Contour Test test strip Generic drug: glucose blood 1 each by Other route daily.   Cranberry 400 MG Caps Take 400 mg by mouth daily.   diltiazem 120 MG 24 hr capsule Commonly known as: DILACOR XR Take 120 mg by mouth daily.   estradiol 0.1 MG/GM vaginal cream Commonly known as: ESTRACE Place 1 Applicatorful vaginally at bedtime.   glipiZIDE 10 MG tablet Commonly known as: GLUCOTROL Take 5 mg by mouth 2 (two) times daily before a meal.   leflunomide 10 MG tablet Commonly known as: ARAVA Take 10 mg by mouth every other day.   magnesium oxide 400 MG tablet Commonly known as: MAG-OX Take by mouth.   metFORMIN 500 MG 24 hr tablet Commonly known as: GLUCOPHAGE-XR Take by mouth.   metoprolol tartrate 50 MG tablet Commonly known as: LOPRESSOR Take 50 mg by mouth 2 (two) times daily.   omeprazole 40 MG capsule Commonly known as: PRILOSEC Take 40 mg by mouth daily.   PROBIOTIC-10 PO Take by mouth.   rosuvastatin 10 MG tablet Commonly known as: CRESTOR Take 10 mg by mouth daily.   tiZANidine 4 MG tablet Commonly known as: ZANAFLEX   vitamin B-12 1000 MCG tablet Commonly known as: CYANOCOBALAMIN Take 1,000 mcg by mouth daily.        Allergies:  Allergies  Allergen Reactions   Ibuprofen Itching   Indomethacin    Infliximab    Methotrexate Derivatives    Moexipril    Percocet [Oxycodone-Acetaminophen] Itching   Naprosyn [Naproxen] Rash    Family History: Family History  Problem Relation Age of Onset   Breast cancer Neg Hx     Social History:  reports that she has never smoked. She has never used smokeless tobacco. She reports that she does not drink alcohol and does not use drugs.  ROS: Pertinent ROS in HPI  Physical Exam: BP (!) 186/81   Pulse 66   Ht _0  (1.676 m)   Wt 160 lb (72.6 kg)   BMI 25.82 kg/m   Constitutional:  Well nourished. Alert and oriented, No acute  distress. HEENT: [place] AT, mask in place.  Trachea midline Cardiovascular: No clubbing, cyanosis, or edema. Respiratory: Normal respiratory effort, no increased work of breathing. Neurologic: Grossly intact, no focal deficits, moving all 4 extremities. Psychiatric: Normal mood and affect.    Laboratory Data: WBC (White Blood Cell Count) 4.1 - 10.2 10^3/uL 8.9   RBC (Red Blood Cell Count) 4.04 - 5.48 10^6/uL 3.94 Low    Hemoglobin 12.0 - 15.0 gm/dL 10.7 Low    Hematocrit 35.0 - 47.0 % 34.0 Low    MCV (Mean Corpuscular Volume) 80.0 - 100.0 fl 86.3  MCH (Mean Corpuscular Hemoglobin) 27.0 - 31.2 pg 27.2   MCHC (Mean Corpuscular Hemoglobin Concentration) 32.0 - 36.0 gm/dL 31.5 Low    Platelet Count 150 - 450 10^3/uL 278   RDW-CV (Red Cell Distribution Width) 11.6 - 14.8 % 14.7   MPV (Mean Platelet Volume) 9.4 - 12.4 fl 11.2   Neutrophils 1.50 - 7.80 10^3/uL 5.99   Lymphocytes 1.00 - 3.60 10^3/uL 1.75   Monocytes 0.00 - 1.50 10^3/uL 0.79   Eosinophils 0.00 - 0.55 10^3/uL 0.30   Basophils 0.00 - 0.09 10^3/uL 0.05   Neutrophil % 32.0 - 70.0 % 67.2   Lymphocyte % 10.0 - 50.0 % 19.7   Monocyte % 4.0 - 13.0 % 8.9   Eosinophil % 1.0 - 5.0 % 3.4   Basophil% 0.0 - 2.0 % 0.6   Immature Granulocyte % <=0.7 % 0.2   Immature Granulocyte Count <=0.06 10^3/L 0.02   Resulting Agency  [facility] - [facility]  Specimen Collected: 06/07/21 08:58 Last Resulted: 06/07/21 09:47  Received From: [facility]  Result Received: 06/08/21 07:52   Creatinine 0.6 - 1.1 mg/dL 1.2 High    Glomerular Filtration Rate (eGFR), MDRD Estimate >60 mL/min/1.73sq m 43 Low    Resulting Agency  [facility] - [facility]  Specimen Collected: 06/07/21 08:58 Last Resulted: 06/07/21 10:28  Received From: [facility]  Result Received: 06/08/21 07:52   Hemoglobin A1C 4.2 - 5.6 % 7.5 High    Average Blood Glucose (Calc) mg/dL 169   Resulting Agency  [facility] - [facility]   Narrative Performed by [facility] - [facility] Normal Range:    4.2 - 5.6%  Increased Risk:  5.7 - 6.4%  Diabetes:        >= 6.5%  Glycemic Control for adults with diabetes:  <7%   Specimen Collected: 03/15/21 07:35 Last Resulted: 03/15/21 09:51  Received From: [facility]  Result Received: 03/20/21 08:42  I have reviewed the labs.    Pertinent imaging: CLINICAL DATA:  Nephrolithiasis.   EXAM: ABDOMEN - 1 VIEW   COMPARISON:  X-ray abdomen 02/16/2021   FINDINGS: Markedly limited evaluation due to overlapping osseous structures and overlying soft tissues. Similar-appearing gaseous distension of a couple loops of bowel in the mid abdomen. Right ureteral stent in grossly similar position with proximal pigtail at the level of the L2-L3 intervertebral disc space level. In the stomach sutures and surgical clips noted overlying the pelvis. No radio-opaque calculi or other significant radiographic abnormality are seen. Lumbosacroiliac surgical hardware again noted.   IMPRESSION: 1. Markedly limited evaluation due to overlapping osseous structures and overlying soft tissues. 2. Right ureteral stent in grossly similar position. 3. Similar-appearing gaseous distension of a couple loops of bowel in the mid abdomen.     Electronically Signed   By: Iven Finn M.D.   On: 07/08/2021 16:07 I have independently reviewed the films.  See HPI.    Cath Change/ Replacement Patient is present today for a catheter change due to urinary retention.  9 ml of water was removed from the balloon, a 16 FR foley cath was removed with out difficulty.  Patient was cleaned and prepped in a sterile fashion with betadine. A 16 FR foley cath was replaced into the bladder no complications were noted Urine return was noted 30 ml and urine was yellow  in color. The balloon was filled with 78m of sterile water. A leg bag was attached for drainage.  A  night bag was also given to the  patient and patient was given instruction on how to change from one bag to another. Patient was given proper instruction on catheter care.    Performed by: Nori Riis, PA-C  Assessment & Plan:    1. Right hydronephrosis/UPJ obstruction -Patient is tolerating the stent well -Urine sent for culture in preparation for right ureteral stent exchange  2.  Incomplete bladder emptying -managed with indwelling Foley -exchanged today   Return for cystoscopy with right ureteral stent with Bard Optima stent .  These notes generated with voice recognition software. I apologize for typographical errors.  Zara Council, PA-C  [facility] [address]  [place], [place] [address] [phone]

## 2021-07-05 NOTE — Progress Notes (Signed)
07/06/2021 5:51 PM   Stephanie [name] 1941-05-23 [id number]  Referring provider: Rusty Aus, MD [facility] [place],  [place] [address]  Chief Complaint  Patient presents with   Urinary Retention    Follow up cath exchange w/KUB    Urological history: 1. 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 rUTI's - contributing factors of age, indwelling Foley and vaginal atrophy - documented positive urine cultures over the last year  E.coli and AEROCOCCUS SPECIES in May 2022   3. 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 female who presents today with her husband, Stephanie [name], for a Foley catheter exchange, urinalysis, urine culture and KUB in preparation of ureteral stent exchange.  Urine sent for culture.    KUB right ureteral stent in place.   She has had no issues with UTI's and/or right flank pain.  Patient denies any modifying or aggravating factors.  Patient denies any gross hematuria, dysuria or suprapubic/flank pain.  Patient denies any fevers, chills, nausea or vomiting.    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    has, does not wear    Surgical History: Past Surgical History:  Procedure Laterality Date   ABDOMINAL HYSTERECTOMY     BACK SURGERY     x5  "screws and rods"   COLON SURGERY     COLONOSCOPY WITH PROPOFOL N/A 05/22/2017   Procedure: COLONOSCOPY WITH PROPOFOL;  Surgeon:  Manya Silvas, MD;  [facility];  Service: Endoscopy;  Laterality: N/A;   COLONOSCOPY WITH PROPOFOL N/A 06/08/2021   Procedure: COLONOSCOPY WITH PROPOFOL;  Surgeon: Lucilla Lame, MD;  Location: [place];  Service: Endoscopy;  Laterality: N/A;  Diabetic   CYSTOSCOPY WITH STENT PLACEMENT Right 08/02/2020   Procedure: CYSTOSCOPY WITH STENT EXCHANGE;  Surgeon: Abbie Sons, MD;  Location: [facility];  Service: Urology;  Laterality: Right;   CYSTOSCOPY WITH STENT PLACEMENT Right 06/18/2020   Procedure: CYSTOSCOPY WITH STENT PLACEMENT;  Surgeon: Irine Seal, MD;  Location: [facility];  Service: Urology;  Laterality: Right;   FRACTURE SURGERY     KNEE ARTHROSCOPY     LAPAROTOMY N/A 06/30/2020   Procedure: EXPLORATORY LAPAROTOMY;  Surgeon: Jules Husbands, MD;  Location: [facility];  Service: [name];  Laterality: N/A;   LUMBAR LAMINECTOMY     POLYPECTOMY  06/08/2021   Procedure: POLYPECTOMY;  Surgeon: Lucilla Lame, MD;  Location: [place];  Service: Endoscopy;;   TEMPORARY DIALYSIS CATHETER N/A 06/27/2020   Procedure: TEMPORARY DIALYSIS CATHETER;  Surgeon: Katha Cabal, MD;  Location: [place] [facility];  Service: Cardiovascular;  Laterality: N/A;    Home Medications:  Allergies as of 07/06/2021       Reactions   Ibuprofen Itching  Indomethacin    Infliximab    Methotrexate Derivatives    Moexipril    Percocet [oxycodone-acetaminophen] Itching   Naprosyn [naproxen] Rash        Medication List        Accurate as of July 06, 2021 11:59 PM. If you have any questions, ask your nurse or doctor.          acetaminophen 325 MG tablet Commonly known as: TYLENOL Take 2 tablets (650 mg total) by mouth every 6 (six) hours as needed for moderate pain, fever or headache.   ascorbic acid 500 MG tablet Commonly known as: VITAMIN C Take 1 tablet (500 mg total) by mouth daily.   brimonidine 0.2 % ophthalmic solution Commonly known as:  ALPHAGAN Place 1 drop into both eyes 2 (two) times daily.   Contour Test test strip Generic drug: glucose blood 1 each by Other route daily.   Cranberry 400 MG Caps Take 400 mg by mouth daily.   diltiazem 120 MG 24 hr capsule Commonly known as: DILACOR XR Take 120 mg by mouth daily.   estradiol 0.1 MG/GM vaginal cream Commonly known as: ESTRACE Place 1 Applicatorful vaginally at bedtime.   glipiZIDE 10 MG tablet Commonly known as: GLUCOTROL Take 5 mg by mouth 2 (two) times daily before a meal.   leflunomide 10 MG tablet Commonly known as: ARAVA Take 10 mg by mouth every other day.   magnesium oxide 400 MG tablet Commonly known as: MAG-OX Take by mouth.   metFORMIN 500 MG 24 hr tablet Commonly known as: GLUCOPHAGE-XR Take by mouth.   metoprolol tartrate 50 MG tablet Commonly known as: LOPRESSOR Take 50 mg by mouth 2 (two) times daily.   omeprazole 40 MG capsule Commonly known as: PRILOSEC Take 40 mg by mouth daily.   PROBIOTIC-10 PO Take by mouth.   rosuvastatin 10 MG tablet Commonly known as: CRESTOR Take 10 mg by mouth daily.   tiZANidine 4 MG tablet Commonly known as: ZANAFLEX   vitamin B-12 1000 MCG tablet Commonly known as: CYANOCOBALAMIN Take 1,000 mcg by mouth daily.        Allergies:  Allergies  Allergen Reactions   Ibuprofen Itching   Indomethacin    Infliximab    Methotrexate Derivatives    Moexipril    Percocet [Oxycodone-Acetaminophen] Itching   Naprosyn [Naproxen] Rash    Family History: Family History  Problem Relation Age of Onset   Breast cancer Neg Hx     Social History:  reports that she has never smoked. She has never used smokeless tobacco. She reports that she does not drink alcohol and does not use drugs.  ROS: Pertinent ROS in HPI  Physical Exam: BP (!) 186/81   Pulse 66   Ht _0  (1.676 m)   Wt 160 lb (72.6 kg)   BMI 25.82 kg/m   Constitutional:  Well nourished. Alert and oriented, No acute  distress. HEENT: [place] AT, mask in place.  Trachea midline Cardiovascular: No clubbing, cyanosis, or edema. Respiratory: Normal respiratory effort, no increased work of breathing. Neurologic: Grossly intact, no focal deficits, moving all 4 extremities. Psychiatric: Normal mood and affect.    Laboratory Data: WBC (White Blood Cell Count) 4.1 - 10.2 10^3/uL 8.9   RBC (Red Blood Cell Count) 4.04 - 5.48 10^6/uL 3.94 Low    Hemoglobin 12.0 - 15.0 gm/dL 10.7 Low    Hematocrit 35.0 - 47.0 % 34.0 Low    MCV (Mean Corpuscular Volume) 80.0 - 100.0 fl 86.3  MCH (Mean Corpuscular Hemoglobin) 27.0 - 31.2 pg 27.2   MCHC (Mean Corpuscular Hemoglobin Concentration) 32.0 - 36.0 gm/dL 31.5 Low    Platelet Count 150 - 450 10^3/uL 278   RDW-CV (Red Cell Distribution Width) 11.6 - 14.8 % 14.7   MPV (Mean Platelet Volume) 9.4 - 12.4 fl 11.2   Neutrophils 1.50 - 7.80 10^3/uL 5.99   Lymphocytes 1.00 - 3.60 10^3/uL 1.75   Monocytes 0.00 - 1.50 10^3/uL 0.79   Eosinophils 0.00 - 0.55 10^3/uL 0.30   Basophils 0.00 - 0.09 10^3/uL 0.05   Neutrophil % 32.0 - 70.0 % 67.2   Lymphocyte % 10.0 - 50.0 % 19.7   Monocyte % 4.0 - 13.0 % 8.9   Eosinophil % 1.0 - 5.0 % 3.4   Basophil% 0.0 - 2.0 % 0.6   Immature Granulocyte % <=0.7 % 0.2   Immature Granulocyte Count <=0.06 10^3/L 0.02   Resulting Agency  [facility] - [facility]  Specimen Collected: 06/07/21 08:58 Last Resulted: 06/07/21 09:47  Received From: [facility]  Result Received: 06/08/21 07:52   Creatinine 0.6 - 1.1 mg/dL 1.2 High    Glomerular Filtration Rate (eGFR), MDRD Estimate >60 mL/min/1.73sq m 43 Low    Resulting Agency  [facility] - [facility]  Specimen Collected: 06/07/21 08:58 Last Resulted: 06/07/21 10:28  Received From: [facility]  Result Received: 06/08/21 07:52   Hemoglobin A1C 4.2 - 5.6 % 7.5 High    Average Blood Glucose (Calc) mg/dL 169   Resulting Agency  [facility] - [facility]   Narrative Performed by [facility] - [facility] Normal Range:    4.2 - 5.6%  Increased Risk:  5.7 - 6.4%  Diabetes:        >= 6.5%  Glycemic Control for adults with diabetes:  <7%   Specimen Collected: 03/15/21 07:35 Last Resulted: 03/15/21 09:51  Received From: [facility]  Result Received: 03/20/21 08:42  I have reviewed the labs.    Pertinent imaging: CLINICAL DATA:  Nephrolithiasis.   EXAM: ABDOMEN - 1 VIEW   COMPARISON:  X-ray abdomen 02/16/2021   FINDINGS: Markedly limited evaluation due to overlapping osseous structures and overlying soft tissues. Similar-appearing gaseous distension of a couple loops of bowel in the mid abdomen. Right ureteral stent in grossly similar position with proximal pigtail at the level of the L2-L3 intervertebral disc space level. In the stomach sutures and surgical clips noted overlying the pelvis. No radio-opaque calculi or other significant radiographic abnormality are seen. Lumbosacroiliac surgical hardware again noted.   IMPRESSION: 1. Markedly limited evaluation due to overlapping osseous structures and overlying soft tissues. 2. Right ureteral stent in grossly similar position. 3. Similar-appearing gaseous distension of a couple loops of bowel in the mid abdomen.     Electronically Signed   By: Iven Finn M.D.   On: 07/08/2021 16:07 I have independently reviewed the films.  See HPI.    Cath Change/ Replacement Patient is present today for a catheter change due to urinary retention.  9 ml of water was removed from the balloon, a 16 FR foley cath was removed with out difficulty.  Patient was cleaned and prepped in a sterile fashion with betadine. A 16 FR foley cath was replaced into the bladder no complications were noted Urine return was noted 30 ml and urine was yellow  in color. The balloon was filled with 78m of sterile water. A leg bag was attached for drainage.  A  night bag was also given to the  patient and patient was given instruction on how to change from one bag to another. Patient was given proper instruction on catheter care.    Performed by: Nori Riis, PA-C  Assessment & Plan:    1. Right hydronephrosis/UPJ obstruction -Patient is tolerating the stent well -Urine sent for culture in preparation for right ureteral stent exchange  2.  Incomplete bladder emptying -managed with indwelling Foley -exchanged today   Return for cystoscopy with right ureteral stent with Bard Optima stent .  These notes generated with voice recognition software. I apologize for typographical errors.  Zara Council, PA-C  [facility] [address]  [place], [place] [address] [phone]

## 2021-07-06 ENCOUNTER — Ambulatory Visit
Admission: RE | Admit: 2021-07-06 | Discharge: 2021-07-06 | Disposition: A | Discharge status: Home / Self Care | Payer: Medicare Other | Attending: Urology | Admitting: Urology

## 2021-07-06 ENCOUNTER — Other Ambulatory Visit: Payer: Self-pay

## 2021-07-06 ENCOUNTER — Telehealth: Payer: Self-pay | Admitting: Urology

## 2021-07-06 ENCOUNTER — Ambulatory Visit
Admission: RE | Admit: 2021-07-06 | Discharge: 2021-07-06 | Disposition: A | Discharge status: Home / Self Care | Payer: Medicare Other | Source: Ambulatory Visit | Attending: Urology | Admitting: Urology

## 2021-07-06 ENCOUNTER — Other Ambulatory Visit: Payer: Self-pay | Admitting: Urology

## 2021-07-06 ENCOUNTER — Ambulatory Visit (INDEPENDENT_AMBULATORY_CARE_PROVIDER_SITE_OTHER): Payer: Medicare Other | Admitting: Urology

## 2021-07-06 VITALS — BP 186/81 | HR 66 | Ht 66.0 in | Wt 160.0 lb

## 2021-07-06 DIAGNOSIS — N135 Crossing vessel and stricture of ureter without hydronephrosis: Secondary | ICD-10-CM | POA: Diagnosis not present

## 2021-07-06 DIAGNOSIS — R339 Retention of urine, unspecified: Secondary | ICD-10-CM | POA: Diagnosis not present

## 2021-07-06 DIAGNOSIS — N200 Calculus of kidney: Secondary | ICD-10-CM | POA: Insufficient documentation

## 2021-07-06 DIAGNOSIS — N1330 Unspecified hydronephrosis: Secondary | ICD-10-CM

## 2021-07-06 NOTE — Progress Notes (Signed)
Surgical Physician Order Form  ** Scheduling expectation : Next Available  *Length of Case:   *Clearance needed: no  *Anticoagulation Instructions: Hold all anticoagulants  *Aspirin Instructions: N/A  *Post-op visit Date/Instructions:  3 month follow up  *Diagnosis: Right Hydronephrosis  *Procedure: Cysto w/stent placement/exchange (44461)  -Admit type: OUTpatient  -Anesthesia: General  -VTE Prophylaxis Standing Order SCD's       Other:   -Standing Lab Orders Per Anesthesia    Lab other: None  -Standing Test orders EKG/Chest x-ray per Anesthesia       Test other:   - Medications:     Ancef 2gm IV   Other Instructions:

## 2021-07-06 NOTE — Telephone Encounter (Signed)
Per Zara Council Patient is to be scheduled for Cystoscopy, with Right Ureteral Stent Exchange with Dr. Bernardo Heater.  Mrs. Cuda was seen today by surgery coordinator Leah surgical dates were discussed, 07/25/21 was agreed upon for surgery.  Patient was directed to call [phone] between 1-3pm the day before surgery to find out surgical arrival time.  Instructions were given not to eat or drink from midnight on the night before surgery and have a driver for the day of surgery. On the surgery day patient was instructed to enter through the [place] entrance of [facility] report the Same Day Surgery desk.   Pre-Admit Testing will be in contact via phone to set up an interview with the anesthesia team to review your history and medications prior to surgery.   Reminder of this information was sent via Mychart to the patient.   Patient is to hold anticoag's per Zara Council

## 2021-07-07 NOTE — Progress Notes (Signed)
[facility] Posting Form   Surgery Date/Time: Date: 07/25/2021  Surgeon: Dr. John Giovanni, MD  Surgery Location: Day Surgery  Inpt ( No  )   Outpt (Yes)   Obs ( No  )   Diagnosis: N13.30 Right Hydronephrosis  -CPT: 70350  Surgery: Right Cystoscopy with stent exchange  Stop Anticoagulations: Yes  Cardiac/Medical/Pulmonary Clearance needed: None Needed  *Orders entered into EPIC  Date: 07/07/2021    *Case booked in EPIC  Date: 07/06/2021  *Notified pt of Surgery: Date: 07/06/2021  PRE-OP UA & CX: None  *Placed into Prior Authorization Work Que Date: 07/07/21   Assistant/laser/rep:No

## 2021-07-07 NOTE — Progress Notes (Signed)
Orders for surgery entered and faxed over to Pre-Admit Testing.

## 2021-07-09 ENCOUNTER — Encounter: Payer: Self-pay | Admitting: Urology

## 2021-07-16 LAB — CULTURE, URINE COMPREHENSIVE

## 2021-07-17 ENCOUNTER — Other Ambulatory Visit: Payer: Self-pay | Admitting: *Deleted

## 2021-07-17 MED ORDER — SULFAMETHOXAZOLE-TRIMETHOPRIM 800-160 MG PO TABS: 1.0000 | ORAL_TABLET | Freq: Two times a day (BID) | ORAL | 0 refills | Status: DC

## 2021-07-21 ENCOUNTER — Other Ambulatory Visit
Admission: RE | Admit: 2021-07-21 | Discharge: 2021-07-21 | Disposition: A | Discharge status: Home / Self Care | Payer: Medicare Other | Source: Ambulatory Visit | Attending: Urology | Admitting: Urology

## 2021-07-21 ENCOUNTER — Other Ambulatory Visit: Payer: Self-pay

## 2021-07-21 HISTORY — DX: Other specified postprocedural states: Z98.890

## 2021-07-21 HISTORY — DX: Pneumonia, unspecified organism: J18.9

## 2021-07-21 HISTORY — DX: Other specified postprocedural states: R11.2

## 2021-07-21 NOTE — Patient Instructions (Addendum)
Your procedure is scheduled on: 07/25/21 - Tuesday Report to the Registration Desk on the 1st floor of the [facility]. To find out your arrival time, please call [phone] between 1PM - 3PM on: !0/24/22 - Monday  REMEMBER: Instructions that are not followed completely may result in serious medical risk, up to and including death; or upon the discretion of your surgeon and anesthesiologist your surgery may need to be rescheduled.  Do not eat food or drink any fluids after midnight the night before surgery.  No gum chewing, lozengers or hard candies.  TAKE THESE MEDICATIONS THE MORNING OF SURGERY WITH A SIP OF WATER:  - brimonidine (ALPHAGAN) 0.2 % ophthalmic solution - diltiazem (DILACOR XR) 120 MG 24 hr capsule - metoprolol tartrate (LOPRESSOR) 50 MG tablet - omeprazole (PRILOSEC) 40 MG capsule, (take one the night before and one on the morning of surgery - helps to prevent nausea after surgery.)  - MetFORMIN (GLUCOPHAGE-XR) 500 MG 24 hr tablet - do not take 10/24, 10/25, or the day of surgery.  One week prior to surgery: Stop Anti-inflammatories (NSAIDS) such as Advil, Aleve, Ibuprofen, Motrin, Naproxen, Naprosyn and Aspirin based products such as Excedrin, Goodys Powder, BC Powder.  Stop ANY OVER THE COUNTER supplements until after surgery.  You may however, continue to take Tylenol if needed for pain up until the day of surgery.  No Alcohol for 24 hours before or after surgery.  No Smoking including e-cigarettes for 24 hours prior to surgery.  No chewable tobacco products for at least 6 hours prior to surgery.  No nicotine patches on the day of surgery.  Do not use any "recreational" drugs for at least a week prior to your surgery.  Please be advised that the combination of cocaine and anesthesia may have negative outcomes, up to and including death. If you test positive for cocaine, your surgery will be cancelled.  On the morning of surgery brush your teeth with  toothpaste and water, you may rinse your mouth with mouthwash if you wish. Do not swallow any toothpaste or mouthwash.  Do not wear jewelry, make-up, hairpins, clips or nail polish.  Do not wear lotions, powders, or perfumes.   Do not shave body from the neck down 48 hours prior to surgery just in case you cut yourself which could leave a site for infection.  Also, freshly shaved skin may become irritated if using the CHG soap.  Contact lenses, hearing aids and dentures may not be worn into surgery.  Do not bring valuables to the hospital. [facility] is not responsible for any missing/lost belongings or valuables.   Notify your doctor if there is any change in your medical condition (cold, fever, infection).  Wear comfortable clothing (specific to your surgery type) to the hospital.  After surgery, you can help prevent lung complications by doing breathing exercises.  Take deep breaths and cough every 1-2 hours. Your doctor may order a device called an Incentive Spirometer to help you take deep breaths. When coughing or sneezing, hold a pillow firmly against your incision with both hands. This is called "splinting." Doing this helps protect your incision. It also decreases belly discomfort.  If you are being admitted to the hospital overnight, leave your suitcase in the car. After surgery it may be brought to your room.  If you are being discharged the day of surgery, you will not be allowed to drive home. You will need a responsible adult (18 years or older) to drive you  home and stay with you that night.   If you are taking public transportation, you will need to have a responsible adult (18 years or older) with you. Please confirm with your physician that it is acceptable to use public transportation.   Please call the [facility] Dept. at [phone] if you have any questions about these instructions.  Surgery Visitation Policy:  Patients undergoing a surgery or  procedure may have one family member or support person with them as long as that person is not COVID-19 positive or experiencing its symptoms.  That person may remain in the waiting area during the procedure and may rotate out with other people.  Inpatient Visitation:    Visiting hours are 7 a.m. to 8 p.m. Up to two visitors ages 16+ are allowed at one time in a patient room. The visitors may rotate out with other people during the day. Visitors must check out when they leave, or other visitors will not be allowed. One designated support person may remain overnight. The visitor must pass COVID-19 screenings, use hand sanitizer when entering and exiting the patient's room and wear a mask at all times, including in the patient's room. Patients must also wear a mask when staff or their visitor are in the room. Masking is required regardless of vaccination status.

## 2021-07-24 MED ORDER — ORAL CARE MOUTH RINSE: 15.0000 mL | Freq: Once | OROMUCOSAL | Status: AC

## 2021-07-24 MED ORDER — CEFAZOLIN SODIUM-DEXTROSE 2-4 GM/100ML-% IV SOLN: 2.0000 g | INTRAVENOUS | Status: DC

## 2021-07-24 MED ORDER — CHLORHEXIDINE GLUCONATE 0.12 % MT SOLN: 15.0000 mL | Freq: Once | OROMUCOSAL | Status: AC

## 2021-07-24 MED ORDER — LACTATED RINGERS IV SOLN: INTRAVENOUS | Status: DC

## 2021-07-25 ENCOUNTER — Encounter
Admission: RE | Disposition: A | Discharge status: Home / Self Care | Payer: Self-pay | Source: Home / Self Care | Attending: Internal Medicine

## 2021-07-25 ENCOUNTER — Ambulatory Visit: Payer: Medicare Other

## 2021-07-25 ENCOUNTER — Observation Stay
Admission: RE | Admit: 2021-07-25 | Discharge: 2021-07-26 | Disposition: A | Discharge status: Home / Self Care | Payer: Medicare Other | Attending: Internal Medicine | Admitting: Internal Medicine

## 2021-07-25 ENCOUNTER — Ambulatory Visit: Payer: Medicare Other | Admitting: Certified Registered Nurse Anesthetist

## 2021-07-25 ENCOUNTER — Observation Stay: Payer: Medicare Other | Admitting: Anesthesiology

## 2021-07-25 ENCOUNTER — Encounter: Payer: Self-pay | Admitting: Urology

## 2021-07-25 ENCOUNTER — Other Ambulatory Visit: Payer: Self-pay

## 2021-07-25 DIAGNOSIS — N201 Calculus of ureter: Secondary | ICD-10-CM | POA: Diagnosis not present

## 2021-07-25 DIAGNOSIS — Z85038 Personal history of other malignant neoplasm of large intestine: Secondary | ICD-10-CM | POA: Diagnosis not present

## 2021-07-25 DIAGNOSIS — R3914 Feeling of incomplete bladder emptying: Secondary | ICD-10-CM | POA: Diagnosis not present

## 2021-07-25 DIAGNOSIS — N390 Urinary tract infection, site not specified: Secondary | ICD-10-CM | POA: Diagnosis present

## 2021-07-25 DIAGNOSIS — R338 Other retention of urine: Secondary | ICD-10-CM | POA: Insufficient documentation

## 2021-07-25 DIAGNOSIS — Z23 Encounter for immunization: Secondary | ICD-10-CM | POA: Insufficient documentation

## 2021-07-25 DIAGNOSIS — Z20822 Contact with and (suspected) exposure to covid-19: Secondary | ICD-10-CM | POA: Insufficient documentation

## 2021-07-25 DIAGNOSIS — N1330 Unspecified hydronephrosis: Secondary | ICD-10-CM | POA: Diagnosis not present

## 2021-07-25 DIAGNOSIS — E1129 Type 2 diabetes mellitus with other diabetic kidney complication: Secondary | ICD-10-CM | POA: Diagnosis present

## 2021-07-25 DIAGNOSIS — I129 Hypertensive chronic kidney disease with stage 1 through stage 4 chronic kidney disease, or unspecified chronic kidney disease: Secondary | ICD-10-CM | POA: Insufficient documentation

## 2021-07-25 DIAGNOSIS — N130 Hydronephrosis with ureteropelvic junction obstruction: Principal | ICD-10-CM | POA: Insufficient documentation

## 2021-07-25 DIAGNOSIS — M069 Rheumatoid arthritis, unspecified: Secondary | ICD-10-CM | POA: Diagnosis present

## 2021-07-25 DIAGNOSIS — N179 Acute kidney failure, unspecified: Secondary | ICD-10-CM | POA: Diagnosis not present

## 2021-07-25 DIAGNOSIS — J45909 Unspecified asthma, uncomplicated: Secondary | ICD-10-CM | POA: Diagnosis not present

## 2021-07-25 DIAGNOSIS — N189 Chronic kidney disease, unspecified: Secondary | ICD-10-CM | POA: Diagnosis not present

## 2021-07-25 DIAGNOSIS — N1831 Chronic kidney disease, stage 3a: Secondary | ICD-10-CM

## 2021-07-25 DIAGNOSIS — D649 Anemia, unspecified: Secondary | ICD-10-CM | POA: Diagnosis present

## 2021-07-25 DIAGNOSIS — E875 Hyperkalemia: Secondary | ICD-10-CM | POA: Diagnosis not present

## 2021-07-25 DIAGNOSIS — E1122 Type 2 diabetes mellitus with diabetic chronic kidney disease: Secondary | ICD-10-CM | POA: Diagnosis not present

## 2021-07-25 DIAGNOSIS — I10 Essential (primary) hypertension: Secondary | ICD-10-CM | POA: Diagnosis not present

## 2021-07-25 DIAGNOSIS — I480 Paroxysmal atrial fibrillation: Secondary | ICD-10-CM | POA: Diagnosis present

## 2021-07-25 DIAGNOSIS — Z960 Presence of urogenital implants: Secondary | ICD-10-CM

## 2021-07-25 DIAGNOSIS — N3289 Other specified disorders of bladder: Secondary | ICD-10-CM | POA: Diagnosis not present

## 2021-07-25 HISTORY — PX: CYSTOSCOPY W/ URETERAL STENT PLACEMENT: SHX1429

## 2021-07-25 LAB — RESP PANEL BY RT-PCR (FLU A&B, COVID) ARPGX2
Influenza A by PCR: NEGATIVE
Influenza B by PCR: NEGATIVE
SARS Coronavirus 2 by RT PCR: NEGATIVE

## 2021-07-25 LAB — URINALYSIS, COMPLETE (UACMP) WITH MICROSCOPIC
Bilirubin Urine: NEGATIVE
Glucose, UA: NEGATIVE mg/dL
Hgb urine dipstick: NEGATIVE
Ketones, ur: NEGATIVE mg/dL
Nitrite: NEGATIVE
Protein, ur: 100 mg/dL — AB
Specific Gravity, Urine: 1.004 — ABNORMAL LOW (ref 1.005–1.030)
WBC, UA: >50 WBC/hpf — ABNORMAL HIGH (ref 0–5)
pH: 6 (ref 5.0–8.0)

## 2021-07-25 LAB — GLUCOSE, CAPILLARY
Glucose-Capillary: 90 mg/dL (ref 70–99)
Glucose-Capillary: 115 mg/dL — ABNORMAL HIGH (ref 70–99)

## 2021-07-25 LAB — BASIC METABOLIC PANEL
Anion gap: 5 (ref 5–15)
BUN: 24 mg/dL — ABNORMAL HIGH (ref 8–23)
CO2: 23 mmol/L (ref 22–32)
Calcium: 9.3 mg/dL (ref 8.9–10.3)
Chloride: 107 mmol/L (ref 98–111)
Creatinine, Ser: 1.71 mg/dL — ABNORMAL HIGH (ref 0.44–1.00)
GFR, Estimated: 30 mL/min — ABNORMAL LOW (ref >60)
Glucose, Bld: 168 mg/dL — ABNORMAL HIGH (ref 70–99)
Potassium: 6 mmol/L — ABNORMAL HIGH (ref 3.5–5.1)
Sodium: 135 mmol/L (ref 135–145)

## 2021-07-25 LAB — CBC
HCT: 34.3 % — ABNORMAL LOW (ref 36.0–46.0)
Hemoglobin: 11.1 g/dL — ABNORMAL LOW (ref 12.0–15.0)
MCH: 27.8 pg (ref 26.0–34.0)
MCHC: 32.4 g/dL (ref 30.0–36.0)
MCV: 85.8 fL (ref 80.0–100.0)
Platelets: 283 10*3/uL (ref 150–400)
RBC: 4 MIL/uL (ref 3.87–5.11)
RDW: 14.5 % (ref 11.5–15.5)
WBC: 8.1 10*3/uL (ref 4.0–10.5)
nRBC: 0 % (ref 0.0–0.2)

## 2021-07-25 LAB — PROTIME-INR
INR: 1 (ref 0.8–1.2)
Prothrombin Time: 13.3 seconds (ref 11.4–15.2)

## 2021-07-25 LAB — POCT I-STAT, CHEM 8
BUN: 21 mg/dL (ref 8–23)
Calcium, Ion: 1.25 mmol/L (ref 1.15–1.40)
Chloride: 108 mmol/L (ref 98–111)
Creatinine, Ser: 1.7 mg/dL — ABNORMAL HIGH (ref 0.44–1.00)
Glucose, Bld: 173 mg/dL — ABNORMAL HIGH (ref 70–99)
HCT: 29 % — ABNORMAL LOW (ref 36.0–46.0)
Hemoglobin: 9.9 g/dL — ABNORMAL LOW (ref 12.0–15.0)
Potassium: 5.8 mmol/L — ABNORMAL HIGH (ref 3.5–5.1)
Sodium: 136 mmol/L (ref 135–145)
TCO2: 20 mmol/L — ABNORMAL LOW (ref 22–32)

## 2021-07-25 LAB — POTASSIUM
Potassium: 6 mmol/L — ABNORMAL HIGH (ref 3.5–5.1)
Potassium: 5.2 mmol/L — ABNORMAL HIGH (ref 3.5–5.1)

## 2021-07-25 SURGERY — CYSTOSCOPY, WITH RETROGRADE PYELOGRAM AND URETERAL STENT INSERTION
Anesthesia: General | Laterality: Right

## 2021-07-25 SURGERY — CYSTOSCOPY, FLEXIBLE, WITH STENT REPLACEMENT
Anesthesia: General | Laterality: Right

## 2021-07-25 MED ORDER — HYDRALAZINE HCL 20 MG/ML IJ SOLN: 5.0000 mg | INTRAMUSCULAR | Status: DC | PRN

## 2021-07-25 MED ORDER — IOHEXOL 180 MG/ML  SOLN
INTRAMUSCULAR | Status: DC | PRN
  Administered 2021-07-25: 10 mL

## 2021-07-25 MED ORDER — SODIUM CHLORIDE 0.9 % IV SOLN
500.0000 mg | Freq: Once | INTRAVENOUS | Status: DC
  Filled 2021-07-25: qty 5

## 2021-07-25 MED ORDER — SODIUM ZIRCONIUM CYCLOSILICATE 10 G PO PACK
10.0000 g | PACK | Freq: Once | ORAL | Status: AC
  Administered 2021-07-25: 10 g via ORAL
  Filled 2021-07-25: qty 1

## 2021-07-25 MED ORDER — METOPROLOL TARTRATE 50 MG PO TABS
50.0000 mg | ORAL_TABLET | Freq: Two times a day (BID) | ORAL | Status: DC
  Administered 2021-07-25: 50 mg via ORAL
  Filled 2021-07-25 (×2): qty 1

## 2021-07-25 MED ORDER — VITAMIN B-12 1000 MCG PO TABS
1000.0000 ug | ORAL_TABLET | Freq: Every day | ORAL | Status: DC
  Administered 2021-07-26: 1000 ug via ORAL
  Filled 2021-07-25: qty 1

## 2021-07-25 MED ORDER — SODIUM CHLORIDE FLUSH 0.9 % IV SOLN
INTRAVENOUS | Status: AC
  Administered 2021-07-25: 10 mL
  Filled 2021-07-25: qty 10

## 2021-07-25 MED ORDER — PROPOFOL 10 MG/ML IV BOLUS
INTRAVENOUS | Status: DC | PRN
  Administered 2021-07-25: 110 mg via INTRAVENOUS

## 2021-07-25 MED ORDER — DEXTROSE 50 % IV SOLN: 50.0000 mL | Freq: Once | INTRAVENOUS | Status: DC

## 2021-07-25 MED ORDER — PANTOPRAZOLE SODIUM 40 MG PO TBEC
40.0000 mg | DELAYED_RELEASE_TABLET | Freq: Every day | ORAL | Status: DC
  Administered 2021-07-26: 40 mg via ORAL
  Filled 2021-07-25: qty 1

## 2021-07-25 MED ORDER — INSULIN ASPART 100 UNIT/ML IV SOLN
5.0000 [IU] | Freq: Once | INTRAVENOUS | Status: DC
  Filled 2021-07-25: qty 0.05

## 2021-07-25 MED ORDER — SODIUM ZIRCONIUM CYCLOSILICATE 5 G PO PACK
5.0000 g | PACK | Freq: Once | ORAL | Status: AC
  Administered 2021-07-26: 5 g via ORAL
  Filled 2021-07-25 (×3): qty 1

## 2021-07-25 MED ORDER — CHLORHEXIDINE GLUCONATE 0.12 % MT SOLN
OROMUCOSAL | Status: AC
  Administered 2021-07-25: 15 mL via OROMUCOSAL
  Filled 2021-07-25: qty 15

## 2021-07-25 MED ORDER — ASCORBIC ACID 500 MG PO TABS
1000.0000 mg | ORAL_TABLET | Freq: Every day | ORAL | Status: DC
  Administered 2021-07-26: 1000 mg via ORAL
  Filled 2021-07-25: qty 2

## 2021-07-25 MED ORDER — ONDANSETRON HCL 4 MG/2ML IJ SOLN
4.0000 mg | Freq: Three times a day (TID) | INTRAMUSCULAR | Status: DC | PRN
Start: 2021-07-25 — End: 2021-07-26

## 2021-07-25 MED ORDER — DEXTROSE 50 % IV SOLN
INTRAVENOUS | Status: AC
  Filled 2021-07-25: qty 50

## 2021-07-25 MED ORDER — ALBUTEROL SULFATE (2.5 MG/3ML) 0.083% IN NEBU: 2.5000 mg | INHALATION_SOLUTION | RESPIRATORY_TRACT | Status: DC | PRN

## 2021-07-25 MED ORDER — INSULIN ASPART 100 UNIT/ML IJ SOLN
INTRAMUSCULAR | Status: AC
  Administered 2021-07-25: 5 [IU] via INTRAVENOUS
  Filled 2021-07-25: qty 1

## 2021-07-25 MED ORDER — FENTANYL CITRATE (PF) 100 MCG/2ML IJ SOLN: 25.0000 ug | INTRAMUSCULAR | Status: DC | PRN

## 2021-07-25 MED ORDER — FENTANYL CITRATE (PF) 100 MCG/2ML IJ SOLN
INTRAMUSCULAR | Status: AC
  Filled 2021-07-25: qty 2

## 2021-07-25 MED ORDER — INSULIN ASPART 100 UNIT/ML IJ SOLN: 0.0000 [IU] | Freq: Every day | INTRAMUSCULAR | Status: DC

## 2021-07-25 MED ORDER — TIZANIDINE HCL 4 MG PO TABS
4.0000 mg | ORAL_TABLET | Freq: Every day | ORAL | Status: DC | PRN
  Filled 2021-07-25: qty 1

## 2021-07-25 MED ORDER — DEXTROSE 50 % IV SOLN
25.0000 mL | Freq: Once | INTRAVENOUS | Status: DC
  Administered 2021-07-25: 25 mL via INTRAVENOUS

## 2021-07-25 MED ORDER — ONDANSETRON HCL 4 MG/2ML IJ SOLN
INTRAMUSCULAR | Status: DC | PRN
Start: 2021-07-25 — End: 2021-07-25
  Administered 2021-07-25: 4 mg via INTRAVENOUS

## 2021-07-25 MED ORDER — CALCIUM GLUCONATE-NACL 1-0.675 GM/50ML-% IV SOLN
1.0000 g | Freq: Once | INTRAVENOUS | Status: AC
  Administered 2021-07-25: 1000 mg via INTRAVENOUS
  Filled 2021-07-25: qty 50

## 2021-07-25 MED ORDER — ALBUTEROL SULFATE (2.5 MG/3ML) 0.083% IN NEBU
INHALATION_SOLUTION | RESPIRATORY_TRACT | Status: AC
  Filled 2021-07-25: qty 3

## 2021-07-25 MED ORDER — BRIMONIDINE TARTRATE 0.2 % OP SOLN
1.0000 [drp] | Freq: Two times a day (BID) | OPHTHALMIC | Status: DC
  Administered 2021-07-26: 1 [drp] via OPHTHALMIC
  Filled 2021-07-25: qty 5

## 2021-07-25 MED ORDER — PROBIOTIC-10 PO CHEW: CHEWABLE_TABLET | Freq: Every day | ORAL | Status: DC

## 2021-07-25 MED ORDER — PROPOFOL 10 MG/ML IV BOLUS
INTRAVENOUS | Status: AC
  Filled 2021-07-25: qty 20

## 2021-07-25 MED ORDER — CEFAZOLIN SODIUM-DEXTROSE 2-3 GM-%(50ML) IV SOLR
INTRAVENOUS | Status: DC | PRN
  Administered 2021-07-25: 2 g via INTRAVENOUS

## 2021-07-25 MED ORDER — SODIUM CHLORIDE 0.9 % IR SOLN
Status: DC | PRN
  Administered 2021-07-25: 1000 mL

## 2021-07-25 MED ORDER — LACTATED RINGERS IV SOLN: INTRAVENOUS | Status: DC

## 2021-07-25 MED ORDER — CRANBERRY 400 MG PO CAPS: 400.0000 mg | ORAL_CAPSULE | Freq: Every day | ORAL | Status: DC

## 2021-07-25 MED ORDER — SODIUM CHLORIDE 0.9 % IV SOLN: INTRAVENOUS | Status: DC | PRN

## 2021-07-25 MED ORDER — ACETAMINOPHEN 325 MG PO TABS: 650.0000 mg | ORAL_TABLET | Freq: Four times a day (QID) | ORAL | Status: DC | PRN

## 2021-07-25 MED ORDER — INFLUENZA VAC A&B SA ADJ QUAD 0.5 ML IM PRSY
0.5000 mL | PREFILLED_SYRINGE | INTRAMUSCULAR | Status: AC
  Administered 2021-07-26: 0.5 mL via INTRAMUSCULAR
  Filled 2021-07-25: qty 0.5

## 2021-07-25 MED ORDER — HYDRALAZINE HCL 20 MG/ML IJ SOLN
INTRAMUSCULAR | Status: AC
  Administered 2021-07-25: 5 mg via INTRAVENOUS
  Filled 2021-07-25: qty 1

## 2021-07-25 MED ORDER — INSULIN ASPART 100 UNIT/ML IJ SOLN
0.0000 [IU] | Freq: Three times a day (TID) | INTRAMUSCULAR | Status: DC
  Administered 2021-07-26: 2 [IU] via SUBCUTANEOUS
  Administered 2021-07-26: 7 [IU] via SUBCUTANEOUS
  Filled 2021-07-25 (×2): qty 1

## 2021-07-25 MED ORDER — DILTIAZEM HCL ER 60 MG PO CP12
120.0000 mg | ORAL_CAPSULE | Freq: Two times a day (BID) | ORAL | Status: DC
  Administered 2021-07-25 – 2021-07-26 (×2): 120 mg via ORAL
  Filled 2021-07-25 (×3): qty 2

## 2021-07-25 MED ORDER — FENTANYL CITRATE (PF) 100 MCG/2ML IJ SOLN
INTRAMUSCULAR | Status: DC | PRN
  Administered 2021-07-25: 25 ug via INTRAVENOUS

## 2021-07-25 MED ORDER — LEFLUNOMIDE 20 MG PO TABS
10.0000 mg | ORAL_TABLET | ORAL | Status: DC
  Administered 2021-07-26: 10 mg via ORAL
  Filled 2021-07-25: qty 0.5

## 2021-07-25 MED ORDER — ROSUVASTATIN CALCIUM 10 MG PO TABS
10.0000 mg | ORAL_TABLET | Freq: Every day | ORAL | Status: DC
  Administered 2021-07-26: 10 mg via ORAL
  Filled 2021-07-25: qty 1

## 2021-07-25 MED ORDER — CEFAZOLIN SODIUM-DEXTROSE 2-4 GM/100ML-% IV SOLN
INTRAVENOUS | Status: AC
  Filled 2021-07-25: qty 100

## 2021-07-25 MED ORDER — INSULIN ASPART 100 UNIT/ML IV SOLN: 10.0000 [IU] | Freq: Once | INTRAVENOUS | Status: DC

## 2021-07-25 MED ORDER — ALBUTEROL SULFATE (2.5 MG/3ML) 0.083% IN NEBU: 2.5000 mg | INHALATION_SOLUTION | Freq: Once | RESPIRATORY_TRACT | Status: DC

## 2021-07-25 MED ORDER — LACTATED RINGERS IV BOLUS: 500.0000 mL | Freq: Once | INTRAVENOUS | Status: DC

## 2021-07-25 SURGICAL SUPPLY — 22 items
BAG DRAIN CYSTO-URO LG1000N (MISCELLANEOUS) ×3 IMPLANT
BRUSH SCRUB EZ 1% IODOPHOR (MISCELLANEOUS) ×3 IMPLANT
CATH FOLEY 2WAY  5CC 16FR (CATHETERS) ×1
CATH URETL OPEN 5X70 (CATHETERS) ×3 IMPLANT
CATH URTH 16FR FL 2W BLN LF (CATHETERS) ×2 IMPLANT
GAUZE 4X4 16PLY ~~LOC~~+RFID DBL (SPONGE) ×6 IMPLANT
GLOVE SURG UNDER POLY LF SZ7.5 (GLOVE) ×3 IMPLANT
GOWN STRL REUS W/ TWL XL LVL3 (GOWN DISPOSABLE) ×2 IMPLANT
GOWN STRL REUS W/TWL XL LVL3 (GOWN DISPOSABLE) ×1
GUIDEWIRE STR DUAL SENSOR (WIRE) ×3 IMPLANT
HOLDER FOLEY CATH W/STRAP (MISCELLANEOUS) ×3 IMPLANT
IV NS IRRIG 3000ML ARTHROMATIC (IV SOLUTION) ×3 IMPLANT
KIT TURNOVER CYSTO (KITS) ×3 IMPLANT
MANIFOLD NEPTUNE II (INSTRUMENTS) ×3 IMPLANT
PACK CYSTO AR (MISCELLANEOUS) ×3 IMPLANT
SET CYSTO W/LG BORE CLAMP LF (SET/KITS/TRAYS/PACK) ×3 IMPLANT
STENT URET 6FRX24 CONTOUR (STENTS) IMPLANT
STENT URET 6FRX26 CONTOUR (STENTS) IMPLANT
STENT URO INLAY 6FRX24CM (STENTS) ×3 IMPLANT
SURGILUBE 2OZ TUBE FLIPTOP (MISCELLANEOUS) ×3 IMPLANT
WATER STERILE IRR 1000ML POUR (IV SOLUTION) ×3 IMPLANT
WATER STERILE IRR 500ML POUR (IV SOLUTION) ×3 IMPLANT

## 2021-07-25 SURGICAL SUPPLY — 18 items
BAG DRAIN CYSTO-URO LG1000N (MISCELLANEOUS) ×2 IMPLANT
BRUSH SCRUB EZ 1% IODOPHOR (MISCELLANEOUS) ×2 IMPLANT
CATH URETL OPEN 5X70 (CATHETERS) IMPLANT
GAUZE 4X4 16PLY ~~LOC~~+RFID DBL (SPONGE) ×4 IMPLANT
GLOVE SURG UNDER POLY LF SZ7.5 (GLOVE) ×2 IMPLANT
GOWN STRL REUS W/ TWL XL LVL3 (GOWN DISPOSABLE) ×1 IMPLANT
GOWN STRL REUS W/TWL XL LVL3 (GOWN DISPOSABLE) ×1
GUIDEWIRE STR DUAL SENSOR (WIRE) ×2 IMPLANT
IV NS IRRIG 3000ML ARTHROMATIC (IV SOLUTION) ×2 IMPLANT
KIT TURNOVER CYSTO (KITS) ×2 IMPLANT
MANIFOLD NEPTUNE II (INSTRUMENTS) ×2 IMPLANT
PACK CYSTO AR (MISCELLANEOUS) ×2 IMPLANT
SET CYSTO W/LG BORE CLAMP LF (SET/KITS/TRAYS/PACK) ×2 IMPLANT
STENT URET 6FRX24 CONTOUR (STENTS) IMPLANT
STENT URET 6FRX26 CONTOUR (STENTS) IMPLANT
SURGILUBE 2OZ TUBE FLIPTOP (MISCELLANEOUS) ×2 IMPLANT
WATER STERILE IRR 1000ML POUR (IV SOLUTION) ×2 IMPLANT
WATER STERILE IRR 500ML POUR (IV SOLUTION) ×2 IMPLANT

## 2021-07-25 NOTE — H&P (Addendum)
History and Physical    Stephanie [name] UKG:[id number] DOB: Sep 10, 1941 DOA: 07/25/2021  Referring MD/NP/PA:   PCP: Rusty Aus, MD   Patient coming from:  The patient is coming from home.    Chief Complaint: hyperkalemia  HPI: Stephanie [name] is a 80 y.o. female with medical history significant of hypertension, hyperlipidemia, diabetes mellitus, asthma, GERD, hard of hearing, small bowel obstruction, urinary retention, indwelling Foley catheter placement, CKD stage IIIa, anemia, rheumatoid arthritis, PAF, colon cancer, bladder cancer, who presents with hyperkalemia.  Patient was recently diagnosed with right hydronephrosis, scheduled for cystoscopy with stent placement by Dr. Bernardo Heater of urology.  Before the procedure, BMP showed hyperkalemia with potassium of 6.0, procedure is canceled.  Patient is a symptomatic.  Patient denies chest pain, cough, shortness breath.  No fever or chills.  No nausea vomiting, diarrhea or abdominal pain.  No symptoms of UTI.  Patient has an indwelling Foley catheter placement, no hematuria.   Of note, patient had positive urinalysis on 10/16 (hazy appearance, large amount of leukocyte, few bacteria, WBC> 50).  She was started on Bactrim on 10/17.  ED Course: pt was found to have hemoglobin 9.9 (13.3 recently), potassium 6.0, worsening renal function, temperature normal, blood pressure 129/65, heart rate 65, RR 18, oxygen saturation 97% on room air.  Patient is placed on progressive bed for observation.  Review of Systems:   General: no fevers, chills, no body weight gain, fatigue HEENT: no blurry vision, hearing changes or sore throat Respiratory: no dyspnea, coughing, wheezing CV: no chest pain, no palpitations GI: no nausea, vomiting, abdominal pain, diarrhea, constipation GU: no dysuria, burning on urination, increased urinary frequency, hematuria  Ext: no leg edema Neuro: no unilateral weakness, numbness, or tingling, no vision change or hearing  loss Skin: no rash, no skin tear. MSK: No muscle spasm, no deformity, no limitation of range of movement in spin Heme: No easy bruising.  Travel history: No recent long distant travel.  Allergy:  Allergies  Allergen Reactions   Infliximab Hives, Shortness Of Breath and Itching   Ibuprofen Itching   Indomethacin Other (See Comments)    headache   Methotrexate Derivatives Other (See Comments)    Headache   Moexipril Other (See Comments)    unknown   Percocet [Oxycodone-Acetaminophen] Itching   Naprosyn [Naproxen] Rash    Past Medical History:  Diagnosis Date   Anemia    Arthritis    Asthma    Cancer ([place]) 1996   colon   Chronic kidney disease    Complication of anesthesia    nausea   Diabetes mellitus without complication ([place])    Foley catheter in place    GERD (gastroesophageal reflux disease)    Glaucoma    Hyperlipidemia    Hypertension    Lumbar disc disease    Pneumonia    PONV (postoperative nausea and vomiting)    Small bowel obstruction ([place])    Urinary retention    Uses walker    Vitamin B 12 deficiency    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    has, does not wear    Past Surgical History:  Procedure Laterality Date   ABDOMINAL HYSTERECTOMY     BACK SURGERY     x5  "screws and rods"   COLON SURGERY     COLONOSCOPY WITH PROPOFOL N/A 05/22/2017   Procedure: COLONOSCOPY WITH PROPOFOL;  Surgeon: Manya Silvas, MD;  Location[facility];  Service: Endoscopy;  Laterality: N/A;  COLONOSCOPY WITH PROPOFOL N/A 06/08/2021   Procedure: COLONOSCOPY WITH PROPOFOL;  Surgeon: Lucilla Lame, MD;  Location: [place];  Service: Endoscopy;  Laterality: N/A;  Diabetic   CYSTOSCOPY WITH STENT PLACEMENT Right 08/02/2020   Procedure: CYSTOSCOPY WITH STENT EXCHANGE;  Surgeon: Abbie Sons, MD;  Location: [facility] ORS;  Service: Urology;  Laterality: Right;   CYSTOSCOPY WITH STENT PLACEMENT Right 06/18/2020   Procedure: CYSTOSCOPY WITH STENT PLACEMENT;  Surgeon:  Irine Seal, MD;  Location: [facility] ORS;  Service: Urology;  Laterality: Right;   FRACTURE SURGERY     KNEE ARTHROSCOPY     LAPAROTOMY N/A 06/30/2020   Procedure: EXPLORATORY LAPAROTOMY;  Surgeon: Jules Husbands, MD;  Location: [facility] ORS;  Service: General;  Laterality: N/A;   LUMBAR LAMINECTOMY     POLYPECTOMY  06/08/2021   Procedure: POLYPECTOMY;  Surgeon: Lucilla Lame, MD;  Location: [place];  Service: Endoscopy;;   TEMPORARY DIALYSIS CATHETER N/A 06/27/2020   Procedure: TEMPORARY DIALYSIS CATHETER;  Surgeon: Katha Cabal, MD;  Location: [place] [facility];  Service: Cardiovascular;  Laterality: N/A;    Social History:  reports that she has never smoked. She has never used smokeless tobacco. She reports that she does not drink alcohol and does not use drugs.  Family History:  Family History  Problem Relation Age of Onset   Breast cancer Neg Hx      Prior to Admission medications   Medication Sig Start Date End Date Taking? Authorizing Provider  ascorbic acid (VITAMIN C) 500 MG tablet Take 1 tablet (500 mg total) by mouth daily. Patient taking differently: Take 1,000 mg by mouth daily. 07/10/20  Yes Nicole Kindred A, DO  brimonidine (ALPHAGAN) 0.2 % ophthalmic solution Place 1 drop into both eyes 2 (two) times daily. 05/31/20  Yes [provider]  Cranberry 400 MG CAPS Take 400 mg by mouth daily.  03/24/20  Yes [provider]  diltiazem (DILACOR XR) 120 MG 24 hr capsule Take 120 mg by mouth 2 (two) times daily.   Yes [provider]  glipiZIDE (GLUCOTROL) 10 MG tablet Take 5 mg by mouth 2 (two) times daily before a meal.   Yes [provider]  ibuprofen (ADVIL) 200 MG tablet Take 400 mg by mouth every 6 (six) hours as needed for mild pain or moderate pain.   Yes [provider]  leflunomide (ARAVA) 10 MG tablet Take 10 mg by mouth every other day.   Yes [provider]  Magnesium Oxide 250 MG TABS Take 250 mg by mouth  daily.   Yes [provider]  metFORMIN (GLUCOPHAGE-XR) 500 MG 24 hr tablet Take 500 mg by mouth at bedtime. 12/07/19  Yes [provider]  metoprolol tartrate (LOPRESSOR) 50 MG tablet Take 50 mg by mouth 2 (two) times daily.   Yes [provider]  omeprazole (PRILOSEC) 40 MG capsule Take 40 mg by mouth daily. 09/11/20  Yes [provider]  Polyvinyl Alcohol (LIQUID TEARS OP) Place 1 drop into both eyes daily as needed (Dry eyes).   Yes [provider]  Probiotic Product (PROBIOTIC-10 PO) Take 1 capsule by mouth daily.   Yes [provider]  rosuvastatin (CRESTOR) 10 MG tablet Take 10 mg by mouth daily.  03/24/20  Yes [provider]  sulfamethoxazole-trimethoprim (BACTRIM DS) 800-160 MG tablet Take 1 tablet by mouth every 12 (twelve) hours for 10 days. 07/17/21 07/27/21 Yes McGowan, Larene Beach A, PA-C  tacrolimus (PROTOPIC) 0.1 % ointment Apply  1 application topically daily as needed (eye).   Yes [provider]  tiZANidine (ZANAFLEX) 4 MG tablet Take 4 mg by mouth daily as needed for muscle spasms. 09/26/20  Yes [provider]  vitamin B-12 (CYANOCOBALAMIN) 1000 MCG tablet Take 1,000 mcg by mouth daily.   Yes [provider]  acetaminophen (TYLENOL) 325 MG tablet Take 2 tablets (650 mg total) by mouth every 6 (six) hours as needed for moderate pain, fever or headache. Patient not taking: Reported on 07/18/2021 07/10/20   Nicole Kindred A, DO  CONTOUR TEST test strip 1 each by Other route daily.  03/09/18   [provider]    Physical Exam: Vitals:   07/25/21 0937 07/25/21 1150  BP: 129/65 (!) 151/66  Pulse: 65 (!) 55  Resp: 18 16  Temp: 97.9 F (36.6 C)   TempSrc: Temporal   SpO2: 97% 96%  Weight: 73.5 kg   Height: 5\' 6"  (1.676 m)    General: Not in acute distress HEENT:       Eyes: PERRL, EOMI, no scleral icterus.       ENT: No discharge from the ears and nose, no pharynx injection, no  tonsillar enlargement.        Neck: No JVD, no bruit, no mass felt. Heme: No neck lymph node enlargement. Cardiac: S1/S2, RRR, No murmurs, No gallops or rubs. Respiratory: No rales, wheezing, rhonchi or rubs. GI: Soft, nondistended, nontender, no rebound pain, no organomegaly, BS present. GU: No hematuria Ext: No pitting leg edema bilaterally. 1+DP/PT pulse bilaterally. Musculoskeletal: No joint deformities, No joint redness or warmth, no limitation of ROM in spin. Skin: No rashes.  Neuro: Alert, oriented X3, cranial nerves II-XII grossly intact, moves all extremities normally.  Psych: Patient is not psychotic, no suicidal or hemocidal ideation.  Labs on Admission: I have personally reviewed following labs and imaging studies  CBC: Recent Labs  Lab 07/25/21 0949  HGB 9.9*  HCT 88.5*   Basic Metabolic Panel: Recent Labs  Lab 07/25/21 0949 07/25/21 1013  NA 136 135  K 5.8* 6.0*  CL 108 107  CO2  --  23  GLUCOSE 173* 168*  BUN 21 24*  CREATININE 1.70* 1.71*  CALCIUM  --  9.3   GFR: Estimated Creatinine Clearance: 27.8 mL/min (A) (by C-G formula based on SCr of 1.71 mg/dL (H)). Liver Function Tests: No results for input(s): AST, ALT, ALKPHOS, BILITOT, PROT, ALBUMIN in the last 168 hours. No results for input(s): LIPASE, AMYLASE in the last 168 hours. No results for input(s): AMMONIA in the last 168 hours. Coagulation Profile: No results for input(s): INR, PROTIME in the last 168 hours. Cardiac Enzymes: No results for input(s): CKTOTAL, CKMB, CKMBINDEX, TROPONINI in the last 168 hours. BNP (last 3 results) No results for input(s): PROBNP in the last 8760 hours. HbA1C: No results for input(s): HGBA1C in the last 72 hours. CBG: No results for input(s): GLUCAP in the last 168 hours. Lipid Profile: No results for input(s): CHOL, HDL, LDLCALC, TRIG, CHOLHDL, LDLDIRECT in the last 72 hours. Thyroid Function Tests: No results for input(s): TSH, T4TOTAL, FREET4, T3FREE,  THYROIDAB in the last 72 hours. Anemia Panel: No results for input(s): VITAMINB12, FOLATE, FERRITIN, TIBC, IRON, RETICCTPCT in the last 72 hours. Urine analysis:    Component Value Date/Time   COLORURINE YELLOW (A) 02/13/2021 2353   APPEARANCEUR HAZY (A) 02/13/2021 2353   APPEARANCEUR Cloudy (A) 07/21/2020 1350   LABSPEC 1.008 02/13/2021 2353   LABSPEC 1.013 01/04/2015 1239  PHURINE 6.0 02/13/2021 2353   GLUCOSEU NEGATIVE 02/13/2021 2353   GLUCOSEU Negative 01/04/2015 1239   HGBUR NEGATIVE 02/13/2021 2353   [place] 02/13/2021 2353   BILIRUBINUR Negative 07/21/2020 1350   BILIRUBINUR Negative 01/04/2015 [place] 02/13/2021 2353   PROTEINUR 100 (A) 02/13/2021 2353   NITRITE NEGATIVE 02/13/2021 2353   LEUKOCYTESUR LARGE (A) 02/13/2021 2353   LEUKOCYTESUR 2+ 01/04/2015 1239   Sepsis Labs: @LABRCNTIP (procalcitonin:4,lacticidven:4) )No results found for this or any previous visit (from the past 240 hour(s)).   Radiological Exams on Admission: DG OR UROLOGY CYSTO IMAGE ([place])  Result Date: 07/25/2021 There is no interpretation for this exam.  This order is for images obtained during a surgical procedure.  Please See "Surgeries" Tab for more information regarding the procedure.     EKG:  Not done in ED, will get one.  Sinus rhythm, QTC 436, bifascicular block, early R wave progression  Assessment/Plan Principal Problem:   Hyperkalemia Active Problems:   Rheumatoid arthritis (HCC)   Essential hypertension   Paroxysmal A-fib (HCC)   Presence of indwelling urinary catheter   Hydronephrosis of right kidney   Type II diabetes mellitus with renal manifestations (HCC)   Normocytic anemia   Asthma   Acute renal failure superimposed on stage 3a chronic kidney disease (HCC)   UTI (urinary tract infection)   Hyperkalemia: K=6.0.  No T wave peaking on EKG.  This is likely due to worsening renal function secondary to right hydronephrosis.  -Placed  on progressive bed for observation -Patient was treated with D50, 5 units of NovoLog, 1 g of calcium gluconate -10 g of Lokelma -IV fluid: 500 cc bolus of LR, then 75 cc/h  Addendum: repated K is 5.2 -will give 5 g of Lolema again  Acute renal failure superimposed on stage 3a chronic kidney disease: Recently baseline creatinine 1.0-1.3.  Her creatinine is 1.71, BUN 24.  This is likely multifactorial etiology, including right hydronephrosis, recently treated UTI, ibuprofen use, Bactrim use. -Hold ibuprofen, Bactrim -IV fluid as above -Avoid using renal toxic medications  Rheumatoid arthritis ([place]) -Continue home leflunomide  Essential hypertension -IV hydralazine as needed - metoprolol, diltiazem  Paroxysmal A-fib ([place]): Patient is not on anticoagulants -Metoprolol, diltiazem  Presence of indwelling urinary catheter -No acute issues  Hydronephrosis of right kidney: Procedure of cystoscopy with stent placement is canceled -Follow-up with urologist further recommendation  Type II diabetes mellitus with renal manifestations [facility]): Recent A1c 7.7, poorly controlled.  Patient taking metformin and glipizide at home -Sliding scale insulin  Normocytic anemia: Hemoglobin stable.  Baseline hemoglobin 10.3 recently, 9.9 today -Follow-up with CBC  Asthma: Stable -As needed albuterol  UTI (urinary tract infection): Patient still taking Bactrim for positive urinalysis since 10/17.  No hematuria.  Patient has a indwelling Foley catheter, likely has colonization. -Hold Bactrim -Repeat urinalysis      DVT ppx: SCD Code Status: Full code Family Communication:   Yes, patient's husband at bed side Disposition Plan:  Anticipate discharge back to previous environment Consults called: Dr. Bernardo Heater of urology   Admission status and Level of care: Progressive Cardiac:     as inpt       Status is: Observation  The patient remains OBS appropriate and will d/c before 2  midnights.         Date of Service 07/25/2021    Ivor Costa [facility]   If 7PM-7AM, please contact night-coverage [email or web address] 07/25/2021, 1:26 PM

## 2021-07-25 NOTE — Interval H&P Note (Signed)
History and Physical Interval Note:  CV: RRR Lungs: Clear  07/25/2021 10:50 AM  Stephanie [name] General  has presented today for surgery, with the diagnosis of right hydronephrosis.  The various methods of treatment have been discussed with the patient and family. After consideration of risks, benefits and other options for treatment, the patient has consented to  Procedure(s): CYSTOSCOPY WITH STENT REPLACEMENT (Right) as a surgical intervention.  The patient's history has been reviewed, patient examined, no change in status, stable for surgery.  I have reviewed the patient's chart and labs.  Questions were answered to the patient's satisfaction.     Omar

## 2021-07-25 NOTE — Plan of Care (Signed)

## 2021-07-25 NOTE — Anesthesia Procedure Notes (Signed)
Procedure Name: LMA Insertion Date/Time: 07/25/2021 6:51 PM Performed by: Nelda Marseille, CRNA Pre-anesthesia Checklist: Patient identified, Patient being monitored, Timeout performed, Emergency Drugs available and Suction available Patient Re-evaluated:Patient Re-evaluated prior to induction Oxygen Delivery Method: Circle system utilized Preoxygenation: Pre-oxygenation with 100% oxygen Induction Type: IV induction Ventilation: Mask ventilation without difficulty LMA: LMA inserted LMA Size: 4.0 Tube type: Oral Number of attempts: 1 Placement Confirmation: positive ETCO2 and breath sounds checked- equal and bilateral Tube secured with: Tape Dental Injury: Teeth and Oropharynx as per pre-operative assessment

## 2021-07-25 NOTE — Progress Notes (Signed)
Notified Dr. Randa Lynn K+ was 5.8 with I stat.  She gave order to draw a BMP and send to lab. Orders placed

## 2021-07-25 NOTE — Transfer of Care (Signed)
Immediate Anesthesia Transfer of Care Note  Patient: Stephanie [name]  Procedure(s) Performed: CYSTOSCOPY WITH RETROGRADE PYELOGRAM/URETERAL STENT PLACEMENT  Patient Location: PACU  Anesthesia Type:General  Level of Consciousness: drowsy  Airway & Oxygen Therapy: Patient Spontanous Breathing and Patient connected to face mask oxygen  Post-op Assessment: Report given to RN  Post vital signs: stable  Last Vitals:  Vitals Value Taken Time  BP 193/64 07/25/21 1912  Temp    Pulse 60 07/25/21 1915  Resp 13 07/25/21 1915  SpO2 100 % 07/25/21 1915  Vitals shown include unvalidated device data.  Last Pain:  Vitals:   07/25/21 0937  TempSrc: Temporal  PainSc: 0-No pain         Complications: No notable events documented.

## 2021-07-25 NOTE — Op Note (Signed)
Preoperative diagnosis:  Chronic right UPJ obstruction Chronic urinary retention Recurrent UTI  Postoperative diagnosis:  Same  Procedure: Cystoscopy Right ureteral stent exchange (82F/24 cm Bard Optima) Right retrograde pyelogram with interpretation Intraoperative fluoroscopy <60 minutes  Surgeon: Abbie Sons, MD  Anesthesia: General  Complications: None  Intraoperative findings:  Cystoscopy: No solid or papillary lesions.  Inflammatory changes right hemitrigone secondary to indwelling stent and bladder base secondary to chronic Foley 2.  Right retrograde pyelogram: Mild right hydronephrosis.  No ureter visualized consistent with history of chronic UPJ obstruction  EBL: Minimal  Specimens: None  Indication: Stephanie [name] is a 80 y.o. female with chronic right UPJ obstruction.  Admitted September 2021 with sepsis of a urinary source and worsening right hydronephrosis and underwent stent placement.  She has elected chronic stent drainage and her stent was last exchanged 08/02/2020.  After reviewing the management options for treatment, he elected to proceed with the above surgical procedure(s). We have discussed the potential benefits and risks of the procedure, side effects of the proposed treatment, the likelihood of the patient achieving the goals of the procedure, and any potential problems that might occur during the procedure or recuperation. Informed consent has been obtained.  Description of procedure:  The patient was taken to the operating room and general anesthesia was induced.  The patient was placed in the dorsal lithotomy position, prepped and draped in the usual sterile fashion, and preoperative antibiotics were administered. A preoperative time-out was performed.   A 21 French cystoscope was lubricated and passed per urethra.  Panendoscopy was performed with findings as described above.  The right ureteral stent was grasped with endoscopic forceps and brought out  to the urethral meatus.  A 0.038 Sensor wire was then placed to the stent and advanced to the renal pelvis under fluoroscopic guidance without difficulty.  The previous stent was removed and a 5 [place] open-ended ureteral catheter was placed over the guidewire to the renal pelvis under fluoroscopic guidance.  The guidewire was removed and retrograde pyelogram was performed with findings as described above.  The Sensor wire was replaced and the ureteral catheter was removed.  The guidewire was backloaded onto the cystoscope and a 82F/24 cm Bard Optima ureteral stent was placed without difficulty.  A proximal curl was noted in the renal pelvis under fluoroscopy and the distal end stent was well positioned under direct vision with the cystoscope.  The cystoscope was removed.  A 16 French Foley catheter was placed to gravity drainage.  After anesthetic reversal she was transported to the PACU in stable condition.  Plan: She is scheduled for a follow-up appointment with Zara Council, PA-C in our office on 08/23/2021    Abbie Sons, M.D.

## 2021-07-25 NOTE — Anesthesia Preprocedure Evaluation (Signed)
Anesthesia Evaluation  Patient identified by MRN, date of birth, ID band Patient awake    Reviewed: Allergy & Precautions, H&P , NPO status , Patient's Chart, lab work & pertinent test results  History of Anesthesia Complications (+) PONV and history of anesthetic complications  Airway Mallampati: II  TM Distance: <3 FB Neck ROM: full   Comment: Small chin Dental  (+) Chipped, Dental Advidsory Given   Pulmonary neg shortness of breath, asthma , neg sleep apnea, neg COPD, neg recent URI,    breath sounds clear to auscultation       Cardiovascular hypertension, (-) angina(-) Past MI and (-) Cardiac Stents + dysrhythmias Atrial Fibrillation  Rhythm:regular Rate:Normal     Neuro/Psych negative neurological ROS  negative psych ROS   GI/Hepatic Neg liver ROS, GERD  ,  Endo/Other  diabetes  Renal/GU Renal disease (CKD)     Musculoskeletal   Abdominal   Peds  Hematology  (+) Blood dyscrasia, anemia ,   Anesthesia Other Findings Past Medical History: No date: Anemia No date: Arthritis No date: Asthma 1996: Cancer ([place])     Comment:  colon No date: Chronic kidney disease No date: Complication of anesthesia     Comment:  nausea No date: Diabetes mellitus without complication (HCC) No date: GERD (gastroesophageal reflux disease) No date: Glaucoma No date: Hyperlipidemia No date: Hypertension No date: Lumbar disc disease No date: Small bowel obstruction (HCC) No date: Vitamin B 12 deficiency  Past Surgical History: No date: ABDOMINAL HYSTERECTOMY No date: BACK SURGERY No date: COLON SURGERY 05/22/2017: COLONOSCOPY WITH PROPOFOL; N/A     Comment:  Procedure: COLONOSCOPY WITH PROPOFOL;  Surgeon: Manya Silvas, MD;  Location: [facility] END[facility]COPY;  Service:               Endoscopy;  Laterality: N/A; 06/18/2020: CYSTOSCOPY WITH STENT PLACEMENT; Right     Comment:  Procedure: [place];   Surgeon:               Irine Seal, MD;  Location: [facility] ORS;  Service: Urology;               Laterality: Right; No date: FRACTURE SURGERY No date: KNEE ARTHROSCOPY 06/30/2020: LAPAROTOMY; N/A     Comment:  Procedure: EXPLORATORY LAPAROTOMY;  Surgeon: Jules Husbands, MD;  Location: [facility] ORS;  Service: General;                Laterality: N/A; No date: LUMBAR LAMINECTOMY 06/27/2020: TEMPORARY DIALYSIS CATHETER; N/A     Comment:  Procedure: TEMPORARY DIALYSIS CATHETER;  Surgeon:               Katha Cabal, MD;  Location: [place] [facility];               Service: Cardiovascular;  Laterality: N/A;     Reproductive/Obstetrics negative OB ROS                             Anesthesia Physical  Anesthesia Plan  ASA: 3  Anesthesia Plan: General   Post-op Pain Management:    Induction: Intravenous  PONV Risk Score and Plan: Treatment may vary due to age or medical condition, TIVA and Propofol infusion  Airway Management Planned: Natural Airway  Additional Equipment:  Intra-op Plan:   Post-operative Plan:   Informed Consent: I have reviewed the patients History and Physical, chart, labs and discussed the procedure including the risks, benefits and alternatives for the proposed anesthesia with the patient or authorized representative who has indicated his/her understanding and acceptance.     Dental Advisory Given  Plan Discussed with: Anesthesiologist, CRNA and Surgeon  Anesthesia Plan Comments:         Anesthesia Quick Evaluation

## 2021-07-25 NOTE — Anesthesia Postprocedure Evaluation (Signed)
Anesthesia Post Note  Patient: Stephanie [name]  Procedure(s) Performed: CYSTOSCOPY WITH RETROGRADE PYELOGRAM/URETERAL [place] PLACEMENT  Patient location during evaluation: PACU Anesthesia Type: General Level of consciousness: awake and alert Pain management: pain level controlled Vital Signs Assessment: post-procedure vital signs reviewed and stable Respiratory status: spontaneous breathing, nonlabored ventilation, respiratory function stable and patient connected to nasal cannula oxygen Cardiovascular status: blood pressure returned to baseline and stable Postop Assessment: no apparent nausea or vomiting Anesthetic complications: no   No notable events documented.   Last Vitals:  Vitals:   07/25/21 1948 07/25/21 2008  BP: (!) 189/66 (!) 182/68  Pulse: 67 66  Resp: 15 18  Temp: (!) 36.3 C 36.5 C  SpO2: 99% 99%    Last Pain:  Vitals:   07/25/21 1948  TempSrc:   PainSc: 0-No pain                 Martha Clan

## 2021-07-26 ENCOUNTER — Encounter: Payer: Self-pay | Admitting: Urology

## 2021-07-26 DIAGNOSIS — N201 Calculus of ureter: Secondary | ICD-10-CM

## 2021-07-26 DIAGNOSIS — R339 Retention of urine, unspecified: Secondary | ICD-10-CM

## 2021-07-26 DIAGNOSIS — N390 Urinary tract infection, site not specified: Secondary | ICD-10-CM | POA: Diagnosis not present

## 2021-07-26 DIAGNOSIS — J4520 Mild intermittent asthma, uncomplicated: Secondary | ICD-10-CM | POA: Diagnosis not present

## 2021-07-26 DIAGNOSIS — N3000 Acute cystitis without hematuria: Secondary | ICD-10-CM

## 2021-07-26 DIAGNOSIS — N130 Hydronephrosis with ureteropelvic junction obstruction: Secondary | ICD-10-CM | POA: Diagnosis not present

## 2021-07-26 DIAGNOSIS — N1832 Chronic kidney disease, stage 3b: Secondary | ICD-10-CM

## 2021-07-26 DIAGNOSIS — N179 Acute kidney failure, unspecified: Secondary | ICD-10-CM | POA: Diagnosis not present

## 2021-07-26 DIAGNOSIS — I10 Essential (primary) hypertension: Secondary | ICD-10-CM | POA: Diagnosis not present

## 2021-07-26 DIAGNOSIS — E875 Hyperkalemia: Secondary | ICD-10-CM | POA: Diagnosis not present

## 2021-07-26 DIAGNOSIS — E1121 Type 2 diabetes mellitus with diabetic nephropathy: Secondary | ICD-10-CM

## 2021-07-26 LAB — BASIC METABOLIC PANEL
Anion gap: 4 — ABNORMAL LOW (ref 5–15)
BUN: 20 mg/dL (ref 8–23)
CO2: 22 mmol/L (ref 22–32)
Calcium: 8.9 mg/dL (ref 8.9–10.3)
Chloride: 110 mmol/L (ref 98–111)
Creatinine, Ser: 1.47 mg/dL — ABNORMAL HIGH (ref 0.44–1.00)
GFR, Estimated: 36 mL/min — ABNORMAL LOW (ref >60)
Glucose, Bld: 134 mg/dL — ABNORMAL HIGH (ref 70–99)
Potassium: 4.7 mmol/L (ref 3.5–5.1)
Sodium: 136 mmol/L (ref 135–145)

## 2021-07-26 LAB — CBC WITH DIFFERENTIAL/PLATELET
Abs Immature Granulocytes: 0.03 10*3/uL (ref 0.00–0.07)
Basophils Absolute: 0.1 10*3/uL (ref 0.0–0.1)
Basophils Relative: 1 %
Eosinophils Absolute: 0.2 10*3/uL (ref 0.0–0.5)
Eosinophils Relative: 3 %
HCT: 31 % — ABNORMAL LOW (ref 36.0–46.0)
Hemoglobin: 10 g/dL — ABNORMAL LOW (ref 12.0–15.0)
Immature Granulocytes: 0 %
Lymphocytes Relative: 23 %
Lymphs Abs: 1.9 10*3/uL (ref 0.7–4.0)
MCH: 28 pg (ref 26.0–34.0)
MCHC: 32.3 g/dL (ref 30.0–36.0)
MCV: 86.8 fL (ref 80.0–100.0)
Monocytes Absolute: 0.7 10*3/uL (ref 0.1–1.0)
Monocytes Relative: 8 %
Neutro Abs: 5.3 10*3/uL (ref 1.7–7.7)
Neutrophils Relative %: 65 %
Platelets: 295 10*3/uL (ref 150–400)
RBC: 3.57 MIL/uL — ABNORMAL LOW (ref 3.87–5.11)
RDW: 14.8 % (ref 11.5–15.5)
WBC: 8.2 10*3/uL (ref 4.0–10.5)
nRBC: 0 % (ref 0.0–0.2)

## 2021-07-26 LAB — GLUCOSE, CAPILLARY
Glucose-Capillary: 155 mg/dL — ABNORMAL HIGH (ref 70–99)
Glucose-Capillary: 325 mg/dL — ABNORMAL HIGH (ref 70–99)

## 2021-07-26 MED ORDER — CHLORHEXIDINE GLUCONATE CLOTH 2 % EX PADS: 6.0000 | MEDICATED_PAD | Freq: Every day | CUTANEOUS | Status: DC

## 2021-07-26 NOTE — Progress Notes (Signed)
Urology Consult Follow Up  Subjective: POD # 1 cystoscopy, right ureteral stent exchange with a Bard Optima stent and right retrograde  Patient is without complaint this morning.  VSS afebrile  Serum creatinine 1.47 down from 1.71 yesterday.  Potassium down to 4.7 from 6.0 yesterday.  UA consistent with colonization.  Anti-infectives: Anti-infectives (From admission, onward)    Start     Dose/Rate Route Frequency Ordered Stop   07/25/21 0927  ceFAZolin (ANCEF) 2-4 GM/100ML-% IVPB       Note to Pharmacy: Herby Abraham   : cabinet override      07/25/21 0927 07/25/21 2144   07/24/21 2312  ceFAZolin (ANCEF) IVPB 2g/100 mL premix  Status:  Discontinued        2 g 200 mL/hr over 30 Minutes Intravenous 30 min pre-op 07/24/21 2312 07/25/21 1135       Current Facility-Administered Medications  Medication Dose Route Frequency Provider Last Rate Last Admin   acetaminophen (TYLENOL) tablet 650 mg  650 mg Oral Q6H PRN Stoioff, Scott C, MD       albuterol (PROVENTIL) (2.5 MG/3ML) 0.083% nebulizer solution 2.5 mg  2.5 mg Inhalation Q4H PRN Stoioff, Scott C, MD       ascorbic acid (VITAMIN C) tablet 1,000 mg  1,000 mg Oral Daily Stoioff, Scott C, MD       brimonidine (ALPHAGAN) 0.2 % ophthalmic solution 1 drop  1 drop Both Eyes BID Stoioff, Scott C, MD       Chlorhexidine Gluconate Cloth 2 % PADS 6 each  6 each Topical Daily Ivor Costa, MD       diltiazem (CARDIZEM SR) 12 hr capsule 120 mg  120 mg Oral BID Stoioff, Scott C, MD   120 mg at 07/25/21 2200   hydrALAZINE (APRESOLINE) injection 5 mg  5 mg Intravenous Q2H PRN Stoioff, Scott C, MD   5 mg at 07/25/21 1936   influenza vaccine adjuvanted (FLUAD) injection 0.5 mL  0.5 mL Intramuscular Tomorrow-1000 Ivor Costa, MD       insulin aspart (novoLOG) injection 0-5 Units  0-5 Units Subcutaneous QHS Stoioff, Scott C, MD       insulin aspart (novoLOG) injection 0-9 Units  0-9 Units Subcutaneous TID WC Stoioff, Scott C, MD       lactated ringers  bolus 500 mL  500 mL Intravenous Once Stoioff, Scott C, MD       lactated ringers infusion   Intravenous Continuous Stoioff, Scott C, MD 75 mL/hr at 07/25/21 2221 New Bag at 07/25/21 2221   leflunomide (ARAVA) tablet 10 mg  10 mg Oral QODAY Stoioff, Scott C, MD       metoprolol tartrate (LOPRESSOR) tablet 50 mg  50 mg Oral BID Stoioff, Scott C, MD   50 mg at 07/25/21 2200   ondansetron (ZOFRAN) injection 4 mg  4 mg Intravenous Q8H PRN Stoioff, Scott C, MD       pantoprazole (PROTONIX) EC tablet 40 mg  40 mg Oral Daily Stoioff, Scott C, MD       rosuvastatin (CRESTOR) tablet 10 mg  10 mg Oral Daily Stoioff, Scott C, MD       tiZANidine (ZANAFLEX) tablet 4 mg  4 mg Oral Daily PRN Stoioff, Scott C, MD       vitamin B-12 (CYANOCOBALAMIN) tablet 1,000 mcg  1,000 mcg Oral Daily Stoioff, Scott C, MD         Objective: Vital signs in last 24 hours: Temp:  [97.2 F (36.2 C)-98.7  F (37.1 C)] 98.1 F (36.7 C) (10/26 0751) Pulse Rate:  [55-67] 59 (10/26 0751) Resp:  [13-19] 17 (10/26 0447) BP: (129-198)/(64-82) 142/67 (10/26 0751) SpO2:  [96 %-100 %] 96 % (10/26 0751) Weight:  [73.5 kg-73.9 kg] 73.9 kg (10/26 0508)  Intake/Output from previous day: 10/25 0701 - 10/26 0700 In: 1184.4 [I.V.:1184.4] Out: 1300 [Urine:1300] Intake/Output this shift: No intake/output data recorded.   Physical Exam Constitutional:  Well nourished. Alert and oriented, No acute distress. HEENT: [place] AT, moist mucus membranes.  Trachea midline, no masses. Cardiovascular: No clubbing, cyanosis, or edema. Respiratory: Normal respiratory effort, no increased work of breathing. Neurologic: Grossly intact, no focal deficits, moving all 4 extremities. Psychiatric: Normal mood and affect.    Lab Results:  Recent Labs    07/25/21 0949 07/25/21 1418  WBC  --  8.1  HGB 9.9* 11.1*  HCT 29.0* 34.3*  PLT  --  283   BMET Recent Labs    07/25/21 1013 07/25/21 1414 07/26/21 0305  NA 135  --  136  K 6.0*  6.0* 5.2*  4.7  CL 107  --  110  CO2 23  --  22  GLUCOSE 168*  --  134*  BUN 24*  --  20  CREATININE 1.71*  --  1.47*  CALCIUM 9.3  --  8.9   PT/INR Recent Labs    07/25/21 1418  LABPROT 13.3  INR 1.0   ABG No results for input(s): PHART, HCO3 in the last 72 hours.  Invalid input(s): PCO2, PO2  Studies/Results: DG OR UROLOGY CYSTO IMAGE ([facility] ONLY)  Result Date: 07/25/2021 There is no interpretation for this exam.  This order is for images obtained during a surgical procedure.  Please See "Surgeries" Tab for more information regarding the procedure.     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  Procedure was delayed until potassium level was stabilized and she underwent a cystoscopy with right ureteral stent exchange yesterday with Dr. Bernardo Heater.  Recommendations: -discharge with Foley -follow up scheduled for Foley exchange on 08/23/2021   LOS: 0 days    [facility] 07/26/2021

## 2021-07-26 NOTE — Discharge Summary (Signed)
[facility] Discharge Summary  Stephanie [name] UUV:[id number] DOB: 10-25-1940 DOA: 07/25/2021  PCP: Rusty Aus, MD  Admit date: 07/25/2021 Discharge date: 07/26/2021  Time spent: 35 minutes  Recommendations for Outpatient Follow-up:   Hyperkalemia:  -K=6.0.  No T wave peaking on EKG.  This is likely due to worsening renal function secondary to right hydronephrosis. 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   -likely multifactorial etiology, including right hydronephrosis, recently treated UTI, ibuprofen use, Bactrim use. Lab Results  Component Value Date   CREATININE 1.47 (H) 07/26/2021   CREATININE 1.71 (H) 07/25/2021   CREATININE 1.70 (H) 07/25/2021   CREATININE 1.22 (H) 02/16/2021   CREATININE 0.92 02/15/2021  -Almost at baseline. -Avoid all nephrotoxic medication - Follow-up with PCP in 2 to 3 weeks to monitor renal function  Rheumatoid arthritis ([place]) -Continue home leflunomide   Essential hypertension - metoprolol, diltiazem   Paroxysmal A-fib ([place]):  -Patient is not on anticoagulants -Metoprolol, diltiazem   Presence of indwelling urinary catheter -No acute issues -Follow-up with urology as directed  UTI (urinary tract infection):  -Patient still taking Bactrim for positive urinalysis since 10/17.  No hematuria.  Patient has a indwelling Foley catheter, likely has colonization. -DC Bactrim at discharge   Hydronephrosis of right kidney:  Procedure of cystoscopy with stent placement is canceled -Follow-up with urologist further recommendation   Type II diabetes mellitus with renal manifestations ([place]):  -Recent A1c 7.7, poorly controlled.  -Patient taking metformin and glipizide at home   Normocytic anemia:  (Baseline HGB 10.3-9.9) Lab Results  Component Value Date   HGB 10.0 (L) 07/26/2021   HGB 11.1 (L) 07/25/2021   HGB 9.9 (L) 07/25/2021   HGB 10.3 (L) 02/16/2021   HGB 10.6 (L) 02/14/2021  -Within normal   Asthma:  -Stable -As needed albuterol   Obese (BMI 26.3kg/m)       Discharge Diagnoses:  Principal Problem:   Hyperkalemia Active Problems:   Rheumatoid arthritis ([place])   Essential hypertension   Paroxysmal A-fib ([facility])   Presence of indwelling urinary catheter   Hydronephrosis of right kidney   Type II diabetes mellitus with renal manifestations ([facility])   Normocytic anemia   Asthma   Acute renal failure superimposed on stage 3a chronic kidney disease ([facility])   UTI (urinary tract infection)   Discharge Condition:   Diet recommendation: Heart healthy  Filed Weights   07/25/21 0937 07/26/21 0508  Weight: 73.5 kg 73.9 kg    History of present illness:  80 y.o. WF PMHx hypertension, hyperlipidemia, diabetes mellitus, asthma, GERD, hard of hearing, small bowel obstruction, urinary retention, indwelling Foley catheter placement, CKD stage IIIa, anemia, rheumatoid arthritis, PAF, colon cancer, bladder cancer, who presents with hyperkalemia.   Patient was recently diagnosed with right hydronephrosis, scheduled for cystoscopy with stent placement by Dr. Bernardo Heater of urology.  Before the procedure, BMP showed hyperkalemia with potassium of 6.0, procedure is canceled.  Patient is a symptomatic.  Patient denies chest pain, cough, shortness breath.  No fever or chills.  No nausea vomiting, diarrhea or abdominal pain.  No symptoms of UTI.  Patient has an indwelling Foley catheter placement, no hematuria.    Of note, patient had positive urinalysis  on 10/16 (hazy appearance, large amount of leukocyte, few bacteria, WBC> 50).  She was started on Bactrim on 10/17.   ED Course: pt was found to have hemoglobin 9.9 (13.3 recently), potassium 6.0, worsening renal function, temperature  normal, blood pressure 129/65, heart rate 65, RR 18, oxygen saturation 97% on room air.  Patient is placed on progressive bed for observation.    Hospital Course:  See above    Procedures: 10/25 Cystoscopy::  Cystoscopy: No solid or papillary lesions.  Inflammatory changes right hemitrigone secondary to indwelling stent and bladder base secondary to chronic Foley 2.  Right retrograde pyelogram: Mild right hydronephrosis.  No ureter visualized consistent with history of chronic UPJ obstruction Right ureteral stent exchange (59F/24 cm Bard Optima) (i.e. Studies not automatically included, echos, thoracentesis, etc; not x-rays)  Consultations: Dr. Bernardo Heater of urology     Cultures  10/25 SARS Coronavirus negative 10/25 influenza A/B negative  Antibiotics Anti-infectives (From admission, onward)    Start     Ordered Stop   07/25/21 0927  ceFAZolin (ANCEF) 2-4 GM/100ML-% IVPB       Note to Pharmacy: Herby Abraham   : cabinet override   07/25/21 0927 07/25/21 2144   07/24/21 2312  ceFAZolin (ANCEF) IVPB 2g/100 mL premix  Status:  Discontinued        07/24/21 2312 07/25/21 1135         Discharge Exam: Vitals:   07/26/21 0447 07/26/21 0508 07/26/21 0751 07/26/21 1131  BP: (!) 156/68  (!) 142/67 (!) 145/52  Pulse: 60  (!) 59 69  Resp: 17     Temp: 98 F (36.7 C)  98.1 F (36.7 C) 98 F (36.7 C)  TempSrc: Oral  Oral Oral  SpO2: 98%  96% 96%  Weight:  73.9 kg    Height:       General: A/O x4, No acute respiratory distress Eyes: negative scleral hemorrhage, negative anisocoria, negative icterus ENT: Negative Runny nose, negative gingival bleeding, Neck:  Negative scars, masses, torticollis, lymphadenopathy, JVD Lungs: Clear to auscultation bilaterally without wheezes or crackles Cardiovascular: Regular rate and rhythm without murmur gallop or rub normal S1 and S2 Abdomen: negative abdominal pain, nondistended, positive soft, bowel sounds, no rebound, no ascites, no  appreciable mass  Discharge Instructions   Allergies as of 07/26/2021       Reactions   Infliximab Hives, Shortness Of Breath, Itching   Ibuprofen Itching   Indomethacin Other (See Comments)   headache   Methotrexate Derivatives Other (See Comments)   Headache   Moexipril Other (See Comments)   unknown   Percocet [oxycodone-acetaminophen] Itching   Naprosyn [naproxen] Rash        Medication List     STOP taking these medications    ibuprofen 200 MG tablet Commonly known as: ADVIL   metFORMIN 500 MG 24 hr tablet Commonly known as: GLUCOPHAGE-XR   sulfamethoxazole-trimethoprim 800-160 MG tablet Commonly known as: BACTRIM DS       TAKE these medications    acetaminophen 325 MG tablet Commonly known as: TYLENOL Take 2 tablets (650 mg total) by mouth every 6 (six) hours as needed for moderate pain, fever or headache.   ascorbic acid 500 MG tablet Commonly known as: VITAMIN C Take 1 tablet (500 mg total) by mouth daily. What changed: how much to take   brimonidine 0.2 % ophthalmic solution Commonly known as: ALPHAGAN Place 1 drop into both eyes 2 (two) times daily.   Contour Test test strip Generic drug:  glucose blood 1 each by Other route daily.   Cranberry 400 MG Caps Take 400 mg by mouth daily.   diltiazem 120 MG 24 hr capsule Commonly known as: DILACOR XR Take 120 mg by mouth 2 (two) times daily.   glipiZIDE 10 MG tablet Commonly known as: GLUCOTROL Take 5 mg by mouth 2 (two) times daily before a meal.   leflunomide 10 MG tablet Commonly known as: ARAVA Take 10 mg by mouth every other day.   LIQUID TEARS OP Place 1 drop into both eyes daily as needed (Dry eyes).   Magnesium Oxide 250 MG Tabs Take 250 mg by mouth daily.   metoprolol tartrate 50 MG tablet Commonly known as: LOPRESSOR Take 50 mg by mouth 2 (two) times daily.   omeprazole 40 MG capsule Commonly known as: PRILOSEC Take 40 mg by mouth daily.   PROBIOTIC-10 PO Take 1  capsule by mouth daily.   rosuvastatin 10 MG tablet Commonly known as: CRESTOR Take 10 mg by mouth daily.   tacrolimus 0.1 % ointment Commonly known as: PROTOPIC Apply 1 application topically daily as needed (eye).   tiZANidine 4 MG tablet Commonly known as: ZANAFLEX Take 4 mg by mouth daily as needed for muscle spasms.   vitamin B-12 1000 MCG tablet Commonly known as: CYANOCOBALAMIN Take 1,000 mcg by mouth daily.       Allergies  Allergen Reactions   Infliximab Hives, Shortness Of Breath and Itching   Ibuprofen Itching   Indomethacin Other (See Comments)    headache   Methotrexate Derivatives Other (See Comments)    Headache   Moexipril Other (See Comments)    unknown   Percocet [Oxycodone-Acetaminophen] Itching   Naprosyn [Naproxen] Rash      The results of significant diagnostics from this hospitalization (including imaging, microbiology, ancillary and laboratory) are listed below for reference.    Significant Diagnostic Studies: Abdomen 1 view (KUB)  Result Date: 07/08/2021 CLINICAL DATA:  Nephrolithiasis. EXAM: ABDOMEN - 1 VIEW COMPARISON:  X-ray abdomen 02/16/2021 FINDINGS: Markedly limited evaluation due to overlapping osseous structures and overlying soft tissues. Similar-appearing gaseous distension of a couple loops of bowel in the mid abdomen. Right ureteral stent in grossly similar position with proximal pigtail at the level of the L2-L3 intervertebral disc space level. In the stomach sutures and surgical clips noted overlying the pelvis. No radio-opaque calculi or other significant radiographic abnormality are seen. Lumbosacroiliac surgical hardware again noted. IMPRESSION: 1. Markedly limited evaluation due to overlapping osseous structures and overlying soft tissues. 2. Right ureteral stent in grossly similar position. 3. Similar-appearing gaseous distension of a couple loops of bowel in the mid abdomen. Electronically Signed   By: Iven Finn M.D.   On:  07/08/2021 16:07   DG OR UROLOGY CYSTO IMAGE ([facility] ONLY)  Result Date: 07/25/2021 There is no interpretation for this exam.  This order is for images obtained during a surgical procedure.  Please See "Surgeries" Tab for more information regarding the procedure.    Microbiology: Recent Results (from the past 240 hour(s))  Resp Panel by RT-PCR (Flu A&B, Covid) Nasopharyngeal Swab     Status: None   Collection Time: 07/25/21  1:12 PM   Specimen: Nasopharyngeal Swab; Nasopharyngeal(NP) swabs in vial transport medium  Result Value Ref Range Status   SARS Coronavirus 2 by RT PCR NEGATIVE NEGATIVE Final    Comment: (NOTE) SARS-CoV-2 target nucleic acids are NOT DETECTED.  The SARS-CoV-2 RNA is generally detectable in upper respiratory specimens during the acute  phase of infection. The lowest concentration of SARS-CoV-2 viral copies this assay can detect is 138 copies/mL. A negative result does not preclude SARS-Cov-2 infection and should not be used as the sole basis for treatment or other patient management decisions. A negative result may occur with  improper specimen collection/handling, submission of specimen other than nasopharyngeal swab, presence of viral mutation(s) within the areas targeted by this assay, and inadequate number of viral copies(<138 copies/mL). A negative result must be combined with clinical observations, patient history, and epidemiological information. The expected result is Negative.  Fact Sheet for Patients:  EntrepreneurPulse.com.au  Fact Sheet for Healthcare Providers:  IncredibleEmployment.be  This test is no t yet approved or cleared by the [place] FDA and  has been authorized for detection and/or diagnosis of SARS-CoV-2 by FDA under an Emergency Use Authorization (EUA). This EUA will remain  in effect (meaning this test can be used) for the duration of the COVID-19 declaration under Section 564(b)(1) of the  Act, 21 U.S.C.section 360bbb-3(b)(1), unless the authorization is terminated  or revoked sooner.       Influenza A by PCR NEGATIVE NEGATIVE Final   Influenza B by PCR NEGATIVE NEGATIVE Final    Comment: (NOTE) The Xpert Xpress SARS-CoV-2/FLU/RSV plus assay is intended as an aid in the diagnosis of influenza from Nasopharyngeal swab specimens and should not be used as a sole basis for treatment. Nasal washings and aspirates are unacceptable for Xpert Xpress SARS-CoV-2/FLU/RSV testing.  Fact Sheet for Patients: EntrepreneurPulse.com.au  Fact Sheet for Healthcare Providers: IncredibleEmployment.be  This test is not yet approved or cleared by the [place] FDA and has been authorized for detection and/or diagnosis of SARS-CoV-2 by FDA under an Emergency Use Authorization (EUA). This EUA will remain in effect (meaning this test can be used) for the duration of the COVID-19 declaration under Section 564(b)(1) of the Act, 21 U.S.C. section 360bbb-3(b)(1), unless the authorization is terminated or revoked.  Performed at [facility], [address] [place], [place] [address]      Labs: Basic Metabolic Panel: Recent Labs  Lab 07/25/21 0258 07/25/21 1013 07/25/21 1414 07/26/21 0305  NA 136 135  --  136  K 5.8* 6.0*  6.0* 5.2* 4.7  CL 108 107  --  110  CO2  --  23  --  22  GLUCOSE 173* 168*  --  134*  BUN 21 24*  --  20  CREATININE 1.70* 1.71*  --  1.47*  CALCIUM  --  9.3  --  8.9   Liver Function Tests: No results for input(s): AST, ALT, ALKPHOS, BILITOT, PROT, ALBUMIN in the last 168 hours. No results for input(s): LIPASE, AMYLASE in the last 168 hours. No results for input(s): AMMONIA in the last 168 hours. CBC: Recent Labs  Lab 07/25/21 0949 07/25/21 1418 07/26/21 0305  WBC  --  8.1 8.2  NEUTROABS  --   --  5.3  HGB 9.9* 11.1* 10.0*  HCT 29.0* 34.3* 31.0*  MCV  --  85.8 86.8  PLT  --  283 295   Cardiac  Enzymes: No results for input(s): CKTOTAL, CKMB, CKMBINDEX, TROPONINI in the last 168 hours. BNP: BNP (last 3 results) No results for input(s): BNP in the last 8760 hours.  ProBNP (last 3 results) No results for input(s): PROBNP in the last 8760 hours.  CBG: Recent Labs  Lab 07/25/21 1744 07/25/21 1916 07/26/21 0753 07/26/21 1132  GLUCAP 90 115* 155* 325*  Signed:  Dia Crawford, MD [facility]

## 2021-08-22 NOTE — Progress Notes (Signed)
Cath Change/ Replacement  Patient is present today for a catheter change due to urinary retention.  9 ml of water was removed from the balloon, a 16 FR foley cath was removed with out difficulty.  Patient was cleaned and prepped in a sterile fashion with betadine. A 16 FR foley cath was replaced into the bladder no complications were noted Urine return was noted 10 ml and urine was yellow in color. The balloon was filled with 70ml of sterile water. A leg bag was attached for drainage.  A night bag was also given to the patient and patient was given instruction on how to change from one bag to another. Patient was given proper instruction on catheter care.    Performed by: Zara Council, PA-C  Follow up: One month follow for Foley exchange

## 2021-08-23 ENCOUNTER — Ambulatory Visit (INDEPENDENT_AMBULATORY_CARE_PROVIDER_SITE_OTHER): Payer: Medicare Other | Admitting: Urology

## 2021-08-23 ENCOUNTER — Other Ambulatory Visit: Payer: Self-pay

## 2021-08-23 DIAGNOSIS — Z978 Presence of other specified devices: Secondary | ICD-10-CM

## 2021-09-20 ENCOUNTER — Ambulatory Visit: Payer: Medicare Other | Admitting: Urology

## 2021-09-26 ENCOUNTER — Ambulatory Visit (INDEPENDENT_AMBULATORY_CARE_PROVIDER_SITE_OTHER): Payer: Medicare Other | Admitting: Physician Assistant

## 2021-09-26 ENCOUNTER — Other Ambulatory Visit: Payer: Self-pay

## 2021-09-26 DIAGNOSIS — Z466 Encounter for fitting and adjustment of urinary device: Secondary | ICD-10-CM

## 2021-09-26 DIAGNOSIS — R339 Retention of urine, unspecified: Secondary | ICD-10-CM | POA: Diagnosis not present

## 2021-09-26 DIAGNOSIS — N3289 Other specified disorders of bladder: Secondary | ICD-10-CM

## 2021-09-26 MED ORDER — MIRABEGRON ER 25 MG PO TB24: 25.0000 mg | ORAL_TABLET | Freq: Every day | ORAL | 0 refills | Status: DC

## 2021-09-26 NOTE — Progress Notes (Signed)
Cath Change/ Replacement  Patient is present today for a catheter change due to urinary retention.  44ml of water was removed from the balloon, a 16FR foley cath was removed without difficulty.  Patient was cleaned and prepped in a sterile fashion with betadine. A 16 FR foley cath was replaced into the bladder no complications were noted Urine return was not noted but placement was confirmed with palpation of the catheter in the urethra along the anterior vaginal wall. The balloon was filled with 2ml of sterile water. A leg bag was attached for drainage.  A night bag was also given to the patient. Patient tolerated well.    Performed by: Debroah Loop, PA-C   Additional notes: Patient reports intermittent sensations of urinary urgency associated with urine leakage around the tubing. 4 weeks of Myrbetriq 25mg  samples provided today to assist with bladder spasms.  Follow up: Return in about 4 weeks (around 10/24/2021) for Catheter exchange.

## 2021-09-27 ENCOUNTER — Ambulatory Visit: Payer: Medicare Other | Admitting: Urology

## 2021-09-27 ENCOUNTER — Ambulatory Visit: Payer: Medicare Other | Admitting: Physician Assistant

## 2021-10-10 ENCOUNTER — Ambulatory Visit: Payer: Medicare Other | Admitting: Urology

## 2021-10-12 ENCOUNTER — Other Ambulatory Visit: Payer: Self-pay

## 2021-10-12 ENCOUNTER — Ambulatory Visit
Admission: RE | Admit: 2021-10-12 | Discharge: 2021-10-12 | Disposition: A | Discharge status: Home / Self Care | Payer: Medicare Other | Source: Ambulatory Visit | Attending: Internal Medicine | Admitting: Internal Medicine

## 2021-10-12 DIAGNOSIS — R921 Mammographic calcification found on diagnostic imaging of breast: Secondary | ICD-10-CM | POA: Diagnosis not present

## 2021-10-17 ENCOUNTER — Other Ambulatory Visit: Payer: Self-pay | Admitting: Internal Medicine

## 2021-10-17 DIAGNOSIS — R928 Other abnormal and inconclusive findings on diagnostic imaging of breast: Secondary | ICD-10-CM

## 2021-10-17 DIAGNOSIS — R921 Mammographic calcification found on diagnostic imaging of breast: Secondary | ICD-10-CM

## 2021-10-30 NOTE — Progress Notes (Signed)
Cath Change/ Replacement  Patient is present today for a catheter change due to urinary retention.  9 ml of water was removed from the balloon, a 16 FR foley cath was removed with out difficulty.  Patient was cleaned and prepped in a sterile fashion with betadine. A 16 FR foley cath was replaced into the bladder no complications were noted Urine return was noted 20 ml and urine was yellow cloudy in color. The balloon was filled with 69ml of sterile water. A leg bag was attached for drainage.  Patient was given proper instruction on catheter care.    Performed by: Zara Council, PA-C  Follow up: One month for Foley exchange.  Will consider RUS in April as patient was hospitalized last year for hydro/sepsis prior to stent exchange.

## 2021-10-31 ENCOUNTER — Ambulatory Visit (INDEPENDENT_AMBULATORY_CARE_PROVIDER_SITE_OTHER): Payer: Medicare Other | Admitting: Urology

## 2021-10-31 ENCOUNTER — Other Ambulatory Visit: Payer: Self-pay

## 2021-10-31 DIAGNOSIS — Z466 Encounter for fitting and adjustment of urinary device: Secondary | ICD-10-CM

## 2021-11-01 ENCOUNTER — Ambulatory Visit
Admission: RE | Admit: 2021-11-01 | Discharge: 2021-11-01 | Disposition: A | Discharge status: Home / Self Care | Payer: Medicare Other | Source: Ambulatory Visit | Attending: Internal Medicine | Admitting: Internal Medicine

## 2021-11-01 DIAGNOSIS — R921 Mammographic calcification found on diagnostic imaging of breast: Secondary | ICD-10-CM | POA: Diagnosis present

## 2021-11-01 DIAGNOSIS — R928 Other abnormal and inconclusive findings on diagnostic imaging of breast: Secondary | ICD-10-CM

## 2021-11-01 HISTORY — PX: BREAST BIOPSY: SHX20

## 2021-11-03 LAB — SURGICAL PATHOLOGY

## 2021-12-04 NOTE — Progress Notes (Signed)
Cath Change/ Replacement ? ?Patient is present today for a catheter change due to urinary retention.  9 ml of water was removed from the balloon, a 16 FR foley cath was removed with out difficulty.  Patient was cleaned and prepped in a sterile fashion with betadine. A 16 FR foley cath was replaced into the bladder no complications were noted Urine return was noted 10 ml and urine was yellow in color. The balloon was filled with 3m of sterile water. A leg bag was attached for drainage.  A night bag was also given to the patient and patient was given instruction on how to change from one bag to another. Patient was given proper instruction on catheter care.   ? ?Performed by: SZara Council PA-C  ? ?Follow up: One month for Foley exchange.  Will consider RUS in April as patient was hospitalized last year for hydro/sepsis prior to stent exchange.     ?

## 2021-12-05 ENCOUNTER — Ambulatory Visit (INDEPENDENT_AMBULATORY_CARE_PROVIDER_SITE_OTHER): Payer: Medicare Other | Admitting: Urology

## 2021-12-05 ENCOUNTER — Other Ambulatory Visit: Payer: Self-pay

## 2021-12-05 DIAGNOSIS — Z466 Encounter for fitting and adjustment of urinary device: Secondary | ICD-10-CM | POA: Diagnosis not present

## 2021-12-22 ENCOUNTER — Telehealth: Payer: Self-pay

## 2021-12-22 DIAGNOSIS — Z978 Presence of other specified devices: Secondary | ICD-10-CM

## 2021-12-22 DIAGNOSIS — R3989 Other symptoms and signs involving the genitourinary system: Secondary | ICD-10-CM

## 2021-12-22 MED ORDER — TRIAMCINOLONE ACETONIDE 0.025 % EX OINT: 1.0000 "application " | TOPICAL_OINTMENT | Freq: Two times a day (BID) | CUTANEOUS | 0 refills | Status: DC

## 2021-12-22 NOTE — Telephone Encounter (Signed)
[facility] in [place], if you let me know the dosage I'm happy to send it in.  ?

## 2021-12-22 NOTE — Telephone Encounter (Signed)
McGowan, Shannon A, PA-C  You 1 hour ago (10:54 AM)  ? ?Triamcinolone cream 0.025%, apply twice daily, # 30 grams.  ?RX sent. Pt informed.  ?

## 2021-12-22 NOTE — Telephone Encounter (Signed)
Incoming call from pt on triage line who states she is concerned she may have a UTI. She states her symptoms are decreased urinary output in her catheter bag and burning in her urethra where catheter is placed. She denies, pain, fever, n/v, or chills. Upon questioning the patient she states that she has had a significant decrease in her fluid intake due to being sick recently, she "just started back drinking water yesterday". Advised pt that decreased urinary output is secondary to not drinking enough. Advised pt on adequate hydration. She voiced understanding, but wonders what she can do about burning. She would like to know if she can have "cream" to help with the discomfort. Please advise.  ?

## 2022-01-02 NOTE — Progress Notes (Signed)
Cath Change/ Replacement ? ?Patient is present today for a catheter change due to urinary retention.  9 ml of water was removed from the balloon, a 16 FR foley cath was removed with out difficulty.  Patient was cleaned and prepped in a sterile fashion with betadine. A 16 FR foley cath was replaced into the bladder no complications were noted Urine return was noted 20 ml and urine was yellow in color. The balloon was filled with 64ml of sterile water. A leg bag was attached for drainage.  A night bag was also given to the patient and patient was given instruction on how to change from one bag to another. Patient was given proper instruction on catheter care.   ? ?Performed by: Zara Council, PA-C  ? ?Follow up: RUS to evaluate for possible hydronephrosis as she was hospitalized last year for hypokalemia that may have been caused by an obstructed ureteral stent, they have had some issues with the catheter clogging up, so I gave him an irrigation kit today and instructed him how to irrigate the catheter if that should happen again. ?

## 2022-01-03 ENCOUNTER — Ambulatory Visit (INDEPENDENT_AMBULATORY_CARE_PROVIDER_SITE_OTHER): Payer: Medicare Other | Admitting: Urology

## 2022-01-03 DIAGNOSIS — Z978 Presence of other specified devices: Secondary | ICD-10-CM | POA: Diagnosis not present

## 2022-01-03 DIAGNOSIS — N135 Crossing vessel and stricture of ureter without hydronephrosis: Secondary | ICD-10-CM

## 2022-01-03 DIAGNOSIS — N390 Urinary tract infection, site not specified: Secondary | ICD-10-CM

## 2022-01-03 DIAGNOSIS — R339 Retention of urine, unspecified: Secondary | ICD-10-CM

## 2022-01-22 ENCOUNTER — Other Ambulatory Visit: Payer: Self-pay | Admitting: Internal Medicine

## 2022-01-22 DIAGNOSIS — Z1231 Encounter for screening mammogram for malignant neoplasm of breast: Secondary | ICD-10-CM

## 2022-01-24 ENCOUNTER — Ambulatory Visit: Payer: Medicare Other

## 2022-01-25 ENCOUNTER — Ambulatory Visit
Admission: RE | Admit: 2022-01-25 | Discharge: 2022-01-25 | Disposition: A | Discharge status: Home / Self Care | Payer: Medicare Other | Source: Ambulatory Visit | Attending: Urology | Admitting: Urology

## 2022-01-25 DIAGNOSIS — N390 Urinary tract infection, site not specified: Secondary | ICD-10-CM | POA: Insufficient documentation

## 2022-01-30 NOTE — H&P (View-Only) (Signed)
? ? ?01/31/2022 ?8:33 AM  ? ?Stephanie [name] ?07-Jan-1941 ?[id number] ? ?Referring provider: Rusty Aus, MD ?[place] ?[facility] ?[place],  [place] [address] ? ?Chief Complaint  ?Patient presents with  ? Urinary Retention  ? ?Urological history: ?1. Incomplete bladder emptying ?- managed with indwelling Foley ? ?2. rUTI's ?- contributing factors of age, indwelling Foley and vaginal atrophy ?- documented positive urine cultures over the last year ? E.coli and AEROCOCCUS SPECIES in May 2022  ? E.coli and MUF, 07/2021 ? ?3. Right UPJ obstruction ?- managed with Bard Optima stent - last exchange 07/25/2021 ? ? ?HPI: ?Stephanie [name] is a 81 y.o. female who presents today with her husband, [name], for a Foley catheter exchange and to review RUS results. ? ?RUS right hydro ? ?She is not experiencing any flank pain.  Patient denies any modifying or aggravating factors.  Patient denies any gross hematuria, dysuria or suprapubic/flank pain.  Patient denies any fevers, chills, nausea or vomiting.   ? ?UA > 30 WBC's, 11-30 RBC's and many bacteria.   ? ?PMH: ?Past Medical History:  ?Diagnosis Date  ? Anemia   ? Arthritis   ? Asthma   ? Cancer [facility]) 1996  ? colon  ? Chronic kidney disease   ? Complication of anesthesia   ? nausea  ? Diabetes mellitus without complication ([place])   ? Foley catheter in place   ? GERD (gastroesophageal reflux disease)   ? Glaucoma   ? Hyperlipidemia   ? Hypertension   ? Lumbar disc disease   ? Pneumonia   ? PONV (postoperative nausea and vomiting)   ? Small bowel obstruction ([place])   ? Urinary retention   ? Uses walker   ? Vitamin B 12 deficiency   ? 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 has, does not wear  ? ? ?Surgical History: ?Past Surgical History:  ?Procedure Laterality Date  ? ABDOMINAL HYSTERECTOMY    ? BACK SURGERY    ? x5  "screws and rods"  ? BREAST BIOPSY Right 11/01/2021  ? rt br biopsy calcs ribbon clip path pending 2nd site LIQ  ? BREAST BIOPSY Right 11/01/2021   ? rt br stereo calcs coil clip 1st site path pending UIQ  ? COLON SURGERY    ? COLONOSCOPY WITH PROPOFOL N/A 05/22/2017  ? Procedure: COLONOSCOPY WITH PROPOFOL;  Surgeon: Manya Silvas, MD;  Location: [facility] ENDOSCOPY;  Service: Endoscopy;  Laterality: N/A;  ? COLONOSCOPY WITH PROPOFOL N/A 06/08/2021  ? Procedure: COLONOSCOPY WITH PROPOFOL;  Surgeon: Lucilla Lame, MD;  Location: [place];  Service: Endoscopy;  Laterality: N/A;  Diabetic  ? CYSTOSCOPY W/ URETERAL STENT PLACEMENT  07/25/2021  ? Procedure: CYSTOSCOPY WITH RETROGRADE PYELOGRAM/URETERAL STENT PLACEMENT;  Surgeon: Abbie Sons, MD;  Location: [facility] ORS;  Service: Urology;;  ? [place] Right 08/02/2020  ? Procedure: CYSTOSCOPY WITH STENT EXCHANGE;  Surgeon: Abbie Sons, MD;  Location: [facility] ORS;  Service: Urology;  Laterality: Right;  ? CYSTOSCOPY WITH STENT PLACEMENT Right 06/18/2020  ? Procedure: CYSTOSCOPY WITH STENT PLACEMENT;  Surgeon: Irine Seal, MD;  Location: [facility] ORS;  Service: Urology;  Laterality: Right;  ? FRACTURE SURGERY    ? KNEE ARTHROSCOPY    ? LAPAROTOMY N/A 06/30/2020  ? Procedure: EXPLORATORY LAPAROTOMY;  Surgeon: Jules Husbands, MD;  Location: [facility] ORS;  Service: General;  Laterality: N/A;  ? LUMBAR LAMINECTOMY    ? POLYPECTOMY  06/08/2021  ?  Procedure: POLYPECTOMY;  Surgeon: Lucilla Lame, MD;  Location: [place];  Service: Endoscopy;;  ? TEMPORARY DIALYSIS CATHETER N/A 06/27/2020  ? Procedure: TEMPORARY DIALYSIS CATHETER;  Surgeon: Katha Cabal, MD;  Location: [place] CV LAB;  Service: Cardiovascular;  Laterality: N/A;  ? ? ?Home Medications:  ?Allergies as of 01/31/2022   ? ?   Reactions  ? Infliximab Hives, Shortness Of Breath, Itching  ? Ibuprofen Itching  ? Indomethacin Other (See Comments)  ? headache  ? Methotrexate Derivatives Other (See Comments)  ? Headache  ? Moexipril Other (See Comments)  ? unknown  ? Percocet [oxycodone-acetaminophen] Itching  ? Naprosyn  [naproxen] Rash  ? ?  ? ?  ?Medication List  ?  ? ?  ? Accurate as of Jan 31, 2022 11:59 PM. If you have any questions, ask your nurse or doctor.  ?  ?  ? ?  ? ?acetaminophen 325 MG tablet ?Commonly known as: TYLENOL ?Take 2 tablets (650 mg total) by mouth every 6 (six) hours as needed for moderate pain, fever or headache. ?  ?ascorbic acid 500 MG tablet ?Commonly known as: VITAMIN C ?Take 1 tablet (500 mg total) by mouth daily. ?What changed: how much to take ?  ?brimonidine 0.2 % ophthalmic solution ?Commonly known as: ALPHAGAN ?Place 1 drop into both eyes 2 (two) times daily. ?  ?Contour Test test strip ?Generic drug: glucose blood ?1 each by Other route daily. ?  ?Cranberry 400 MG Caps ?Take 400 mg by mouth daily. ?  ?diltiazem 120 MG 24 hr capsule ?Commonly known as: DILACOR XR ?Take 120 mg by mouth 2 (two) times daily. ?  ?glipiZIDE 10 MG tablet ?Commonly known as: GLUCOTROL ?Take 5 mg by mouth 2 (two) times daily before a meal. ?  ?leflunomide 10 MG tablet ?Commonly known as: ARAVA ?Take 10 mg by mouth every other day. ?  ?LIQUID TEARS OP ?Place 1 drop into both eyes daily as needed (Dry eyes). ?  ?Magnesium Oxide 250 MG Tabs ?Take 250 mg by mouth daily. ?  ?metoprolol tartrate 50 MG tablet ?Commonly known as: LOPRESSOR ?Take 50 mg by mouth 2 (two) times daily. ?  ?mirabegron ER 25 MG Tb24 tablet ?Commonly known as: MYRBETRIQ ?Take 1 tablet (25 mg total) by mouth daily. ?  ?omeprazole 40 MG capsule ?Commonly known as: PRILOSEC ?Take 40 mg by mouth daily. ?  ?PROBIOTIC-10 PO ?Take 1 capsule by mouth daily. ?  ?rosuvastatin 10 MG tablet ?Commonly known as: CRESTOR ?Take 10 mg by mouth daily. ?  ?tacrolimus 0.1 % ointment ?Commonly known as: PROTOPIC ?Apply 1 application topically daily as needed (eye). ?  ?tiZANidine 4 MG tablet ?Commonly known as: ZANAFLEX ?Take 4 mg by mouth daily as needed for muscle spasms. ?  ?triamcinolone 0.025 % ointment ?Commonly known as: KENALOG ?Apply 1 application. topically 2 (two)  times daily. ?  ?vitamin B-12 1000 MCG tablet ?Commonly known as: CYANOCOBALAMIN ?Take 1,000 mcg by mouth daily. ?  ?Vitron-C 65-125 MG Tabs ?Generic drug: Iron-Vitamin C ?Take 1 tablet by mouth daily. ?  ? ?  ? ? ?Allergies:  ?Allergies  ?Allergen Reactions  ? Infliximab Hives, Shortness Of Breath and Itching  ? Ibuprofen Itching  ? Indomethacin Other (See Comments)  ?  headache  ? Methotrexate Derivatives Other (See Comments)  ?  Headache  ? Moexipril Other (See Comments)  ?  unknown  ? Percocet [Oxycodone-Acetaminophen] Itching  ? Naprosyn [Naproxen] Rash  ? ? ?Family History: ?Family History  ?  Problem Relation Age of Onset  ? Breast cancer Neg Hx   ? ? ?Social History:  reports that she has never smoked. She has never used smokeless tobacco. She reports that she does not drink alcohol and does not use drugs. ? ?ROS: ?Pertinent ROS in HPI ? ?Physical Exam: ?Constitutional:  Well nourished. Alert and oriented, No acute distress. ?HEENT: [place] AT, moist mucus membranes.  Trachea midline, no masses. ?Cardiovascular: No clubbing, cyanosis, or edema. ?Respiratory: Normal respiratory effort, no increased work of breathing. ?Neurologic: Grossly intact, no focal deficits, moving all 4 extremities. ?Psychiatric: Normal mood and affect.   ? ?Laboratory Data: ?Color, Urine YELLOW YELLOW   ?Appearance Urine CLEAR TURBID Abnormal    ?Specific Gravity, UA 1.001 - 1.035 1.018   ?pH Urine 5.0 - 8.0 5.5   ?Glucose, Ur NEGATIVE NEGATIVE   ?Ketones, Urine NEGATIVE NEGATIVE   ?Hemoglobin Ur Ql Strip NEGATIVE 1+ Abnormal    ?Protein, Ur NEGATIVE 3+ Abnormal    ?Resulting Agency  [facility]  ?Narrative ?Performed by [facility] ?FASTING:NO  ? ?FASTING: NO ?Specimen Collected: 01/09/22 09:14 Last Resulted: 01/10/22 10:34  ?Received From: [facility]  Result Received: 01/22/22 08:51  ? ?WBC 3.8 - 10.8 Thousand/uL 11.8 High     ?RBC 3.80 - 5.10 Million/uL 3.81    ?Hemoglobin 11.7 - 15.5 g/dL 10.3 Low     ?Hematocrit 35.0 - 45.0 %  33.5 Low     ?MCV 80.0 - 100.0 fL 87.9    ?MCH 27.0 - 33.0 pg 27.0    ?MCHC 32.0 - 36.0 g/dL 30.7 Low     ?RDW 11.0 - 15.0 % 12.9    ?Platelets 140 - 400 Thousand/uL 419 High     ?MPV 7.5 - 12.5 fL 1

## 2022-01-30 NOTE — Progress Notes (Signed)
? ? ?01/31/2022 ?8:33 AM  ? ?Stephanie [name] ?January 18, 1941 ?[id number] ? ?Referring provider: Rusty Aus, MD ?[place] ?[facility] ?[place],  [place] [address] ? ?Chief Complaint  ?Patient presents with  ? Urinary Retention  ? ?Urological history: ?1. Incomplete bladder emptying ?- managed with indwelling Foley ? ?2. rUTI's ?- contributing factors of age, indwelling Foley and vaginal atrophy ?- documented positive urine cultures over the last year ? E.coli and AEROCOCCUS SPECIES in May 2022  ? E.coli and MUF, 07/2021 ? ?3. Right UPJ obstruction ?- managed with Bard Optima stent - last exchange 07/25/2021 ? ? ?HPI: ?Stephanie [name] is a 81 y.o. female who presents today with her husband, Stephanie [name], for a Foley catheter exchange and to review RUS results. ? ?RUS right hydro ? ?She is not experiencing any flank pain.  Patient denies any modifying or aggravating factors.  Patient denies any gross hematuria, dysuria or suprapubic/flank pain.  Patient denies any fevers, chills, nausea or vomiting.   ? ?UA > 30 WBC's, 11-30 RBC's and many bacteria.   ? ?PMH: ?Past Medical History:  ?Diagnosis Date  ? Anemia   ? Arthritis   ? Asthma   ? Cancer [facility]) 1996  ? colon  ? Chronic kidney disease   ? Complication of anesthesia   ? nausea  ? Diabetes mellitus without complication ([place])   ? Foley catheter in place   ? GERD (gastroesophageal reflux disease)   ? Glaucoma   ? Hyperlipidemia   ? Hypertension   ? Lumbar disc disease   ? Pneumonia   ? PONV (postoperative nausea and vomiting)   ? Small bowel obstruction ([place])   ? Urinary retention   ? Uses walker   ? Vitamin B 12 deficiency   ? 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 has, does not wear  ? ? ?Surgical History: ?Past Surgical History:  ?Procedure Laterality Date  ? ABDOMINAL HYSTERECTOMY    ? BACK SURGERY    ? x5  "screws and rods"  ? BREAST BIOPSY Right 11/01/2021  ? rt br biopsy calcs ribbon clip path pending 2nd site LIQ  ? BREAST BIOPSY Right 11/01/2021   ? rt br stereo calcs coil clip 1st site path pending UIQ  ? COLON SURGERY    ? COLONOSCOPY WITH PROPOFOL N/A 05/22/2017  ? Procedure: COLONOSCOPY WITH PROPOFOL;  Surgeon: Manya Silvas, MD;  Location: [facility] ENDOSCOPY;  Service: Endoscopy;  Laterality: N/A;  ? COLONOSCOPY WITH PROPOFOL N/A 06/08/2021  ? Procedure: COLONOSCOPY WITH PROPOFOL;  Surgeon: Lucilla Lame, MD;  Location: [place];  Service: Endoscopy;  Laterality: N/A;  Diabetic  ? CYSTOSCOPY W/ URETERAL STENT PLACEMENT  07/25/2021  ? Procedure: CYSTOSCOPY WITH RETROGRADE PYELOGRAM/URETERAL STENT PLACEMENT;  Surgeon: Abbie Sons, MD;  Location: [facility] ORS;  Service: Urology;;  ? [place] Right 08/02/2020  ? Procedure: CYSTOSCOPY WITH STENT EXCHANGE;  Surgeon: Abbie Sons, MD;  Location: [facility];  Service: Urology;  Laterality: Right;  ? CYSTOSCOPY WITH STENT PLACEMENT Right 06/18/2020  ? Procedure: CYSTOSCOPY WITH STENT PLACEMENT;  Surgeon: Irine Seal, MD;  Location: [facility] OR[facility];  Service: Urology;  Laterality: Right;  ? FRACTURE SURGERY    ? KNEE ARTHROSCOPY    ? LAPAROTOMY N/A 06/30/2020  ? Procedure: EXPLORATORY LAPAROTOMY;  Surgeon: Jules Husbands, MD;  Location: [facility] ORS;  Service: General;  Laterality: N/A;  ? LUMBAR LAMINECTOMY    ? POLYPECTOMY  06/08/2021  ?  Procedure: POLYPECTOMY;  Surgeon: Lucilla Lame, MD;  Location: [place];  Service: Endoscopy;;  ? TEMPORARY DIALYSIS CATHETER N/A 06/27/2020  ? Procedure: TEMPORARY DIALYSIS CATHETER;  Surgeon: Katha Cabal, MD;  Location: [place] [facility];  Service: Cardiovascular;  Laterality: N/A;  ? ? ?Home Medications:  ?Allergies as of 01/31/2022   ? ?   Reactions  ? Infliximab Hives, Shortness Of Breath, Itching  ? Ibuprofen Itching  ? Indomethacin Other (See Comments)  ? headache  ? Methotrexate Derivatives Other (See Comments)  ? Headache  ? Moexipril Other (See Comments)  ? unknown  ? Percocet [oxycodone-acetaminophen] Itching  ? Naprosyn  [naproxen] Rash  ? ?  ? ?  ?Medication List  ?  ? ?  ? Accurate as of Jan 31, 2022 11:59 PM. If you have any questions, ask your nurse or doctor.  ?  ?  ? ?  ? ?acetaminophen 325 MG tablet ?Commonly known as: TYLENOL ?Take 2 tablets (650 mg total) by mouth every 6 (six) hours as needed for moderate pain, fever or headache. ?  ?ascorbic acid 500 MG tablet ?Commonly known as: VITAMIN C ?Take 1 tablet (500 mg total) by mouth daily. ?What changed: how much to take ?  ?brimonidine 0.2 % ophthalmic solution ?Commonly known as: ALPHAGAN ?Place 1 drop into both eyes 2 (two) times daily. ?  ?Contour Test test strip ?Generic drug: glucose blood ?1 each by Other route daily. ?  ?Cranberry 400 MG Caps ?Take 400 mg by mouth daily. ?  ?diltiazem 120 MG 24 hr capsule ?Commonly known as: DILACOR XR ?Take 120 mg by mouth 2 (two) times daily. ?  ?glipiZIDE 10 MG tablet ?Commonly known as: GLUCOTROL ?Take 5 mg by mouth 2 (two) times daily before a meal. ?  ?leflunomide 10 MG tablet ?Commonly known as: ARAVA ?Take 10 mg by mouth every other day. ?  ?LIQUID TEARS OP ?Place 1 drop into both eyes daily as needed (Dry eyes). ?  ?Magnesium Oxide 250 MG Tabs ?Take 250 mg by mouth daily. ?  ?metoprolol tartrate 50 MG tablet ?Commonly known as: LOPRESSOR ?Take 50 mg by mouth 2 (two) times daily. ?  ?mirabegron ER 25 MG Tb24 tablet ?Commonly known as: MYRBETRIQ ?Take 1 tablet (25 mg total) by mouth daily. ?  ?omeprazole 40 MG capsule ?Commonly known as: PRILOSEC ?Take 40 mg by mouth daily. ?  ?PROBIOTIC-10 PO ?Take 1 capsule by mouth daily. ?  ?rosuvastatin 10 MG tablet ?Commonly known as: CRESTOR ?Take 10 mg by mouth daily. ?  ?tacrolimus 0.1 % ointment ?Commonly known as: PROTOPIC ?Apply 1 application topically daily as needed (eye). ?  ?tiZANidine 4 MG tablet ?Commonly known as: ZANAFLEX ?Take 4 mg by mouth daily as needed for muscle spasms. ?  ?triamcinolone 0.025 % ointment ?Commonly known as: KENALOG ?Apply 1 application. topically 2 (two)  times daily. ?  ?vitamin B-12 1000 MCG tablet ?Commonly known as: CYANOCOBALAMIN ?Take 1,000 mcg by mouth daily. ?  ?Vitron-C 65-125 MG Tabs ?Generic drug: Iron-Vitamin C ?Take 1 tablet by mouth daily. ?  ? ?  ? ? ?Allergies:  ?Allergies  ?Allergen Reactions  ? Infliximab Hives, Shortness Of Breath and Itching  ? Ibuprofen Itching  ? Indomethacin Other (See Comments)  ?  headache  ? Methotrexate Derivatives Other (See Comments)  ?  Headache  ? Moexipril Other (See Comments)  ?  unknown  ? Percocet [Oxycodone-Acetaminophen] Itching  ? Naprosyn [Naproxen] Rash  ? ? ?Family History: ?Family History  ?  Problem Relation Age of Onset  ? Breast cancer Neg Hx   ? ? ?Social History:  reports that she has never smoked. She has never used smokeless tobacco. She reports that she does not drink alcohol and does not use drugs. ? ?ROS: ?Pertinent ROS in HPI ? ?Physical Exam: ?Constitutional:  Well nourished. Alert and oriented, No acute distress. ?HEENT: [place] AT, moist mucus membranes.  Trachea midline, no masses. ?Cardiovascular: No clubbing, cyanosis, or edema. ?Respiratory: Normal respiratory effort, no increased work of breathing. ?Neurologic: Grossly intact, no focal deficits, moving all 4 extremities. ?Psychiatric: Normal mood and affect.   ? ?Laboratory Data: ?Color, Urine YELLOW YELLOW   ?Appearance Urine CLEAR TURBID Abnormal    ?Specific Gravity, UA 1.001 - 1.035 1.018   ?pH Urine 5.0 - 8.0 5.5   ?Glucose, Ur NEGATIVE NEGATIVE   ?Ketones, Urine NEGATIVE NEGATIVE   ?Hemoglobin Ur Ql Strip NEGATIVE 1+ Abnormal    ?Protein, Ur NEGATIVE 3+ Abnormal    ?Resulting Agency  [facility]  ?Narrative ?Performed by [facility] ?FASTING:NO  ? ?FASTING: NO ?Specimen Collected: 01/09/22 09:14 Last Resulted: 01/10/22 10:34  ?Received From: [facility]  Result Received: 01/22/22 08:51  ? ?WBC 3.8 - 10.8 Thousand/uL 11.8 High     ?RBC 3.80 - 5.10 Million/uL 3.81    ?Hemoglobin 11.7 - 15.5 g/dL 10.3 Low     ?Hematocrit 35.0 - 45.0 %  33.5 Low     ?MCV 80.0 - 100.0 fL 87.9    ?MCH 27.0 - 33.0 pg 27.0    ?MCHC 32.0 - 36.0 g/dL 30.7 Low     ?RDW 11.0 - 15.0 % 12.9    ?Platelets 140 - 400 Thousand/uL 419 High     ?MPV 7.5 - 12.5 fL 1

## 2022-01-31 ENCOUNTER — Ambulatory Visit (INDEPENDENT_AMBULATORY_CARE_PROVIDER_SITE_OTHER): Payer: Medicare Other | Admitting: Urology

## 2022-01-31 ENCOUNTER — Other Ambulatory Visit: Payer: Self-pay | Admitting: Urology

## 2022-01-31 ENCOUNTER — Telehealth: Payer: Self-pay

## 2022-01-31 DIAGNOSIS — Z978 Presence of other specified devices: Secondary | ICD-10-CM

## 2022-01-31 DIAGNOSIS — N1330 Unspecified hydronephrosis: Secondary | ICD-10-CM

## 2022-01-31 DIAGNOSIS — N135 Crossing vessel and stricture of ureter without hydronephrosis: Secondary | ICD-10-CM

## 2022-01-31 DIAGNOSIS — R3914 Feeling of incomplete bladder emptying: Secondary | ICD-10-CM

## 2022-01-31 LAB — URINALYSIS, COMPLETE
Bilirubin, UA: NEGATIVE
Glucose, UA: NEGATIVE
Ketones, UA: NEGATIVE
Nitrite, UA: NEGATIVE
Specific Gravity, UA: 1.025 (ref 1.005–1.030)
Urobilinogen, Ur: 0.2 mg/dL (ref 0.2–1.0)
pH, UA: 6 (ref 5.0–7.5)

## 2022-01-31 LAB — MICROSCOPIC EXAMINATION: WBC, UA: >30 /hpf — AB (ref 0–5)

## 2022-01-31 NOTE — Progress Notes (Signed)
[facility] Posting Form  ? ?Surgery Date/Time: Date: 02/06/2022 ? ?Surgeon: Dr. John Giovanni, MD ? ?Surgery Location: Day Surgery ? ?Inpt ( No  )   Outpt (Yes)   Obs ( No  )  ? ?Diagnosis: Right Hydronephrosis N13.30 ? ?-CPT: 32202 ? ?Surgery: Right Cystoscopy with stent exchange ? ?Stop Anticoagulations: Yes and hold ASA ? ?Cardiac/Medical/Pulmonary Clearance needed: no ? ?*Orders entered into EPIC  Date: 01/31/22  ? ?*Case booked in [place]  Date: 01/31/22 ? ?*Notified pt of Surgery: Date: 01/31/22 ? ?PRE-OP UA & CX: no ? ?*Placed into Prior Authorization Work Que Date: 01/31/22 ? ? ?Assistant/laser/rep:No ? ? ? ? ? ? ? ? ? ? ? ? ? ? ? ?

## 2022-01-31 NOTE — Progress Notes (Signed)
Surgical Physician Order Form [facility] [place] ? ?* Scheduling expectation : ASAP with Dr. Bernardo Heater next Tuesday 02/06/2022  ? ?*Length of Case:  ? ?*Clearance needed: no ? ?*Anticoagulation Instructions: Hold all anticoagulants ? ?*Aspirin Instructions: Hold Aspirin ? ?*Post-op visit Date/Instructions:  1 month follow up ? ?*Diagnosis: Right Hydronephrosis ? ?*Procedure: right  Cysto w/stent exchange (20721)  ? ? ?Additional orders: N/A ? ?-Admit type: OUTpatient ? ?-Anesthesia: General ? ?-VTE Prophylaxis Standing Order SCD?s    ?   ?Other:  ? ?-Standing Lab Orders Per Anesthesia   ? ?Lab other: None ? ?-Standing Test orders EKG/Chest x-ray per Anesthesia      ? ?Test other:  ? ?- Medications:  Ancef 2gm IV ? ?-Other orders:  N/A ? ? ? ?   ?

## 2022-01-31 NOTE — Telephone Encounter (Signed)
I spoke with Stephanie [name] and her husband in the office. We have discussed possible surgery dates and Tuesday May 9th, 2023 was agreed upon by all parties. Patient given information about surgery date, what to expect pre-operatively and post operatively.  ?We discussed that a Pre-Admission Testing office will be calling to set up the pre-op visit that will take place prior to surgery, and that these appointments are typically done over the phone with a Pre-Admissions RN. Informed patient that our office will communicate any additional care to be provided after surgery.  ?Patients questions or concerns were discussed during our call. Advised to call our office should there be any additional information, questions or concerns that arise. Patient verbalized understanding.  ? ?

## 2022-02-01 ENCOUNTER — Encounter: Payer: Self-pay | Admitting: Urology

## 2022-02-01 LAB — CREATININE, SERUM
Creatinine, Ser: 1.54 mg/dL — ABNORMAL HIGH (ref 0.57–1.00)
eGFR: 34 mL/min/{1.73_m2} — ABNORMAL LOW (ref >59)

## 2022-02-02 ENCOUNTER — Telehealth: Payer: Self-pay

## 2022-02-02 ENCOUNTER — Other Ambulatory Visit
Admission: RE | Admit: 2022-02-02 | Discharge: 2022-02-02 | Disposition: A | Discharge status: Home / Self Care | Payer: Medicare Other | Source: Ambulatory Visit | Attending: Urology | Admitting: Urology

## 2022-02-02 DIAGNOSIS — Z8679 Personal history of other diseases of the circulatory system: Secondary | ICD-10-CM

## 2022-02-02 DIAGNOSIS — R739 Hyperglycemia, unspecified: Secondary | ICD-10-CM

## 2022-02-02 DIAGNOSIS — N185 Chronic kidney disease, stage 5: Secondary | ICD-10-CM

## 2022-02-02 DIAGNOSIS — E1121 Type 2 diabetes mellitus with diabetic nephropathy: Secondary | ICD-10-CM

## 2022-02-02 HISTORY — DX: Cardiac arrhythmia, unspecified: I49.9

## 2022-02-02 MED ORDER — SULFAMETHOXAZOLE-TRIMETHOPRIM 800-160 MG PO TABS: 1.0000 | ORAL_TABLET | Freq: Two times a day (BID) | ORAL | 0 refills | Status: AC

## 2022-02-02 NOTE — Telephone Encounter (Signed)
-----   Message from Nori Riis, PA-C sent at 02/02/2022 12:12 PM EDT ----- ?She has a cysto/stent exchange on Tuesday, so let's get her started on Septra DS, twice daily for seven days.  Her urine culture is still in preliminary status, so I may need to change it later.   I'll let them know through MyChart if the final comes in over the weekend.  ?

## 2022-02-02 NOTE — Patient Instructions (Addendum)
?Your procedure is scheduled on: Tuesday Feb 06, 2022. ?Report to Day Surgery inside [facility] 2nd floor, stop by admissions desk before getting on elevator.  ?To find out your arrival time please call [phone] between 1PM - 3PM on Monday Feb 05, 2022. ? ?Remember: Instructions that are not followed completely may result in serious medical risk,  ?up to and including death, or upon the discretion of your surgeon and anesthesiologist your  ?surgery may need to be rescheduled.  ? ?  _X__ 1. Do not eat food or drink fluids after midnight the night before your procedure. ?                No chewing gum or hard candies.  ? ?__X__2.  On the morning of surgery brush your teeth with toothpaste and water, you ?               may rinse your mouth with mouthwash if you wish.  Do not swallow any toothpaste or mouthwash. ?   ? _X__ 3.  No Alcohol for 24 hours before or after surgery. ? ? _X__ 4.  Do Not Smoke or use e-cigarettes For 24 Hours Prior to Your Surgery. ?                Do not use any chewable tobacco products for at least 6 hours prior to ?                Surgery. ? ?_X__  5.  Do not use any recreational drugs (marijuana, cocaine, heroin, ecstasy, MDMA or other) ?               For at least one week prior to your surgery.  Combination of these drugs with anesthesia ?               May have life threatening results. ? ?____  6.  Bring all medications with you on the day of surgery if instructed.  ? ?__X_ 7 .  Notify your doctor if there is any change in your medical condition  ?    (cold, fever, infections). ?    ?Do not wear jewelry, make-up, hairpins, clips or nail polish. ?Do not wear lotions, powders, or perfumes. You may wear deodorant. ?Do not shave 48 hours prior to surgery. Men may shave face and neck. ?Do not bring valuables to the hospital.   ? ?[facility] is not responsible for any belongings or valuables. ? ?Contacts, dentures or bridgework may not be worn into surgery. ?Leave  your suitcase in the car. After surgery it may be brought to your room. ?For patients admitted to the hospital, discharge time is determined by your ?treatment team. ?  ?Patients discharged the day of surgery will not be allowed to drive home.   ?Make arrangements for someone to be with you for the first 24 hours of your ?Same Day Discharge. ? ? ?__X__ Take these medicines the morning of surgery with A SIP OF WATER:  ? ? 1. diltiazem (CARDIZEM CD) 120 MG  ? 2. leflunomide (ARAVA) 10 MG ? 3. metoprolol tartrate (LOPRESSOR) 50 MG ? 4. omeprazole (PRILOSEC) 40 MG  ? 5.  ? 6. ? ?____ Fleet Enema (as directed)  ? ?____ Use CHG Soap (or wipes) as directed ? ?____ Use Benzoyl Peroxide Gel as instructed ? ?____ Use inhalers on the day of surgery ? ?__X__ Stop metFORMIN (GLUCOPHAGE-XR) 500 MG 2 days prior to surgery(take last dose 02/03/22)  ? ?  ____ Take 1/2 of usual insulin dose the night before surgery. No insulin the morning ?         of surgery.  ? ?____ Call your PCP, cardiologist, or Pulmonologist if taking Coumadin/Plavix/aspirin and ask when to stop before your surgery.  ? ?__X__ One Week prior to surgery- Stop Anti-inflammatories such as Ibuprofen, Aleve, Advil, Motrin, meloxicam (MOBIC), diclofenac, etodolac, ketorolac, Toradol, Daypro, piroxicam, Goody's or BC powders. OK TO USE TYLENOL IF NEEDED ?  ?__X__ One week prior to surgery- Stop ALL supplements until after surgery. vitamin B-12, Magnesium Oxide, Iron-Vitamin C (VITRON-C), Cranberry 400 MG and ascorbic acid (VITAMIN C) ? ?____ Bring C-Pap to the hospital.  ? ? ?If you have any questions regarding your pre-procedure instructions,  ?Please call Pre-admit Testing at [phone] ?

## 2022-02-02 NOTE — Telephone Encounter (Signed)
Advised pt of message below. Sent in ABX Rx. Pt expressed understanding.  ?

## 2022-02-03 LAB — CULTURE, URINE COMPREHENSIVE

## 2022-02-04 ENCOUNTER — Other Ambulatory Visit: Payer: Self-pay | Admitting: Urology

## 2022-02-04 DIAGNOSIS — N390 Urinary tract infection, site not specified: Secondary | ICD-10-CM

## 2022-02-04 MED ORDER — CEFDINIR 300 MG PO CAPS: 300.0000 mg | ORAL_CAPSULE | Freq: Two times a day (BID) | ORAL | 0 refills | Status: DC

## 2022-02-05 ENCOUNTER — Telehealth: Payer: Self-pay | Admitting: Urology

## 2022-02-05 ENCOUNTER — Other Ambulatory Visit
Admission: RE | Admit: 2022-02-05 | Discharge: 2022-02-05 | Disposition: A | Discharge status: Home / Self Care | Payer: Medicare Other | Source: Ambulatory Visit | Attending: Urology | Admitting: Urology

## 2022-02-05 DIAGNOSIS — E1121 Type 2 diabetes mellitus with diabetic nephropathy: Secondary | ICD-10-CM | POA: Diagnosis not present

## 2022-02-05 DIAGNOSIS — E1165 Type 2 diabetes mellitus with hyperglycemia: Secondary | ICD-10-CM | POA: Diagnosis not present

## 2022-02-05 DIAGNOSIS — N185 Chronic kidney disease, stage 5: Secondary | ICD-10-CM | POA: Diagnosis not present

## 2022-02-05 DIAGNOSIS — Z8679 Personal history of other diseases of the circulatory system: Secondary | ICD-10-CM | POA: Diagnosis not present

## 2022-02-05 DIAGNOSIS — R739 Hyperglycemia, unspecified: Secondary | ICD-10-CM

## 2022-02-05 DIAGNOSIS — Z01818 Encounter for other preprocedural examination: Secondary | ICD-10-CM | POA: Insufficient documentation

## 2022-02-05 LAB — BASIC METABOLIC PANEL
Anion gap: 9 (ref 5–15)
BUN: 34 mg/dL — ABNORMAL HIGH (ref 8–23)
CO2: 21 mmol/L — ABNORMAL LOW (ref 22–32)
Calcium: 9 mg/dL (ref 8.9–10.3)
Chloride: 107 mmol/L (ref 98–111)
Creatinine, Ser: 1.87 mg/dL — ABNORMAL HIGH (ref 0.44–1.00)
GFR, Estimated: 27 mL/min — ABNORMAL LOW (ref >60)
Glucose, Bld: 177 mg/dL — ABNORMAL HIGH (ref 70–99)
Potassium: 4.5 mmol/L (ref 3.5–5.1)
Sodium: 137 mmol/L (ref 135–145)

## 2022-02-05 LAB — CBC
HCT: 28.3 % — ABNORMAL LOW (ref 36.0–46.0)
Hemoglobin: 8.6 g/dL — ABNORMAL LOW (ref 12.0–15.0)
MCH: 26.3 pg (ref 26.0–34.0)
MCHC: 30.4 g/dL (ref 30.0–36.0)
MCV: 86.5 fL (ref 80.0–100.0)
Platelets: 315 10*3/uL (ref 150–400)
RBC: 3.27 MIL/uL — ABNORMAL LOW (ref 3.87–5.11)
RDW: 14.6 % (ref 11.5–15.5)
WBC: 10.9 10*3/uL — ABNORMAL HIGH (ref 4.0–10.5)
nRBC: 0 % (ref 0.0–0.2)

## 2022-02-05 NOTE — Telephone Encounter (Signed)
Pt's husband states pt did pick up medication yesterday and started it last night. She is at pre-admit now.  ?

## 2022-02-05 NOTE — Telephone Encounter (Signed)
Would you call Stephanie [name] and ask if she picked up the [facility] I sent over to her pharmacy yesterday and if she has started it?  If she hasn't started the [facility], she should come to the office and get a shot of Rocephin because her stent exchange is tomorrow.   ?

## 2022-02-06 ENCOUNTER — Other Ambulatory Visit: Payer: Self-pay

## 2022-02-06 ENCOUNTER — Ambulatory Visit
Admission: RE | Admit: 2022-02-06 | Discharge: 2022-02-06 | Disposition: A | Discharge status: Home / Self Care | Payer: Medicare Other | Attending: Urology | Admitting: Urology

## 2022-02-06 ENCOUNTER — Encounter: Payer: Self-pay | Admitting: Urology

## 2022-02-06 ENCOUNTER — Encounter
Admission: RE | Disposition: A | Discharge status: Home / Self Care | Payer: Self-pay | Source: Home / Self Care | Attending: Urology

## 2022-02-06 ENCOUNTER — Ambulatory Visit: Payer: Medicare Other | Admitting: Anesthesiology

## 2022-02-06 ENCOUNTER — Ambulatory Visit: Payer: Medicare Other

## 2022-02-06 DIAGNOSIS — I4891 Unspecified atrial fibrillation: Secondary | ICD-10-CM | POA: Insufficient documentation

## 2022-02-06 DIAGNOSIS — Z85038 Personal history of other malignant neoplasm of large intestine: Secondary | ICD-10-CM | POA: Diagnosis not present

## 2022-02-06 DIAGNOSIS — N135 Crossing vessel and stricture of ureter without hydronephrosis: Secondary | ICD-10-CM | POA: Diagnosis not present

## 2022-02-06 DIAGNOSIS — E1122 Type 2 diabetes mellitus with diabetic chronic kidney disease: Secondary | ICD-10-CM | POA: Diagnosis not present

## 2022-02-06 DIAGNOSIS — N1330 Unspecified hydronephrosis: Secondary | ICD-10-CM

## 2022-02-06 DIAGNOSIS — N183 Chronic kidney disease, stage 3 unspecified: Secondary | ICD-10-CM | POA: Diagnosis not present

## 2022-02-06 DIAGNOSIS — Z87440 Personal history of urinary (tract) infections: Secondary | ICD-10-CM | POA: Insufficient documentation

## 2022-02-06 DIAGNOSIS — N952 Postmenopausal atrophic vaginitis: Secondary | ICD-10-CM | POA: Insufficient documentation

## 2022-02-06 DIAGNOSIS — N131 Hydronephrosis with ureteral stricture, not elsewhere classified: Secondary | ICD-10-CM | POA: Insufficient documentation

## 2022-02-06 DIAGNOSIS — I129 Hypertensive chronic kidney disease with stage 1 through stage 4 chronic kidney disease, or unspecified chronic kidney disease: Secondary | ICD-10-CM | POA: Diagnosis not present

## 2022-02-06 DIAGNOSIS — R339 Retention of urine, unspecified: Secondary | ICD-10-CM | POA: Diagnosis not present

## 2022-02-06 DIAGNOSIS — K219 Gastro-esophageal reflux disease without esophagitis: Secondary | ICD-10-CM | POA: Diagnosis not present

## 2022-02-06 HISTORY — PX: CYSTOSCOPY W/ URETERAL STENT PLACEMENT: SHX1429

## 2022-02-06 LAB — GLUCOSE, CAPILLARY
Glucose-Capillary: 154 mg/dL — ABNORMAL HIGH (ref 70–99)
Glucose-Capillary: 134 mg/dL — ABNORMAL HIGH (ref 70–99)

## 2022-02-06 SURGERY — CYSTOSCOPY, WITH RETROGRADE PYELOGRAM AND URETERAL STENT INSERTION
Anesthesia: General

## 2022-02-06 MED ORDER — HYDRALAZINE HCL 20 MG/ML IJ SOLN
INTRAMUSCULAR | Status: AC
  Administered 2022-02-06: 10 mg
  Filled 2022-02-06: qty 1

## 2022-02-06 MED ORDER — CHLORHEXIDINE GLUCONATE 0.12 % MT SOLN
15.0000 mL | Freq: Once | OROMUCOSAL | Status: AC
  Administered 2022-02-06: 15 mL via OROMUCOSAL

## 2022-02-06 MED ORDER — LACTATED RINGERS IV SOLN: INTRAVENOUS | Status: DC

## 2022-02-06 MED ORDER — PROPOFOL 10 MG/ML IV BOLUS
INTRAVENOUS | Status: AC
  Filled 2022-02-06: qty 20

## 2022-02-06 MED ORDER — ACETAMINOPHEN 10 MG/ML IV SOLN: 1000.0000 mg | Freq: Once | INTRAVENOUS | Status: DC | PRN

## 2022-02-06 MED ORDER — ONDANSETRON HCL 4 MG/2ML IJ SOLN
INTRAMUSCULAR | Status: DC | PRN
  Administered 2022-02-06: 4 mg via INTRAVENOUS

## 2022-02-06 MED ORDER — GLYCOPYRROLATE 0.2 MG/ML IJ SOLN
INTRAMUSCULAR | Status: DC | PRN
Start: 2022-02-06 — End: 2022-02-06
  Administered 2022-02-06: .2 mg via INTRAVENOUS

## 2022-02-06 MED ORDER — HYDRALAZINE HCL 20 MG/ML IJ SOLN
10.0000 mg | Freq: Once | INTRAMUSCULAR | Status: AC | PRN
Start: 2022-02-06 — End: 2022-02-06
  Administered 2022-02-06: 10 mg via INTRAVENOUS

## 2022-02-06 MED ORDER — IOHEXOL 180 MG/ML  SOLN
INTRAMUSCULAR | Status: DC | PRN
  Administered 2022-02-06: 10 mL

## 2022-02-06 MED ORDER — CEFAZOLIN SODIUM-DEXTROSE 2-4 GM/100ML-% IV SOLN
INTRAVENOUS | Status: AC
  Filled 2022-02-06: qty 100

## 2022-02-06 MED ORDER — SODIUM CHLORIDE 0.9 % IR SOLN
Status: DC | PRN
Start: 2022-02-06 — End: 2022-02-06
  Administered 2022-02-06: 3000 mL via INTRAVESICAL

## 2022-02-06 MED ORDER — SODIUM CHLORIDE 0.9 % IV SOLN: INTRAVENOUS | Status: DC

## 2022-02-06 MED ORDER — CHLORHEXIDINE GLUCONATE 0.12 % MT SOLN
OROMUCOSAL | Status: AC
  Filled 2022-02-06: qty 15

## 2022-02-06 MED ORDER — ORAL CARE MOUTH RINSE: 15.0000 mL | Freq: Once | OROMUCOSAL | Status: AC

## 2022-02-06 MED ORDER — ONDANSETRON HCL 4 MG/2ML IJ SOLN: 4.0000 mg | Freq: Once | INTRAMUSCULAR | Status: DC | PRN

## 2022-02-06 MED ORDER — CEFAZOLIN SODIUM-DEXTROSE 2-4 GM/100ML-% IV SOLN
2.0000 g | INTRAVENOUS | Status: AC
  Administered 2022-02-06: 2 g via INTRAVENOUS

## 2022-02-06 MED ORDER — DEXAMETHASONE SODIUM PHOSPHATE 10 MG/ML IJ SOLN
INTRAMUSCULAR | Status: DC | PRN
  Administered 2022-02-06: 5 mg via INTRAVENOUS

## 2022-02-06 MED ORDER — LIDOCAINE HCL (PF) 2 % IJ SOLN
INTRAMUSCULAR | Status: AC
  Filled 2022-02-06: qty 5

## 2022-02-06 MED ORDER — PROPOFOL 10 MG/ML IV BOLUS
INTRAVENOUS | Status: DC | PRN
  Administered 2022-02-06: 150 mg via INTRAVENOUS

## 2022-02-06 MED ORDER — LIDOCAINE HCL (CARDIAC) PF 100 MG/5ML IV SOSY
PREFILLED_SYRINGE | INTRAVENOUS | Status: DC | PRN
  Administered 2022-02-06: 100 mg via INTRAVENOUS

## 2022-02-06 MED ORDER — FENTANYL CITRATE (PF) 100 MCG/2ML IJ SOLN: 25.0000 ug | INTRAMUSCULAR | Status: DC | PRN

## 2022-02-06 MED ORDER — FENTANYL CITRATE (PF) 100 MCG/2ML IJ SOLN
INTRAMUSCULAR | Status: DC | PRN
  Administered 2022-02-06: 25 ug via INTRAVENOUS

## 2022-02-06 MED ORDER — FENTANYL CITRATE (PF) 100 MCG/2ML IJ SOLN
INTRAMUSCULAR | Status: AC
  Filled 2022-02-06: qty 2

## 2022-02-06 SURGICAL SUPPLY — 22 items
BAG DRAIN CYSTO-URO LG1000N (MISCELLANEOUS) ×3 IMPLANT
BAG DRN LRG ANRFLXTWST DRN VL (MISCELLANEOUS) ×2
BAG URINE LEG 25OZ (MISCELLANEOUS) ×2 IMPLANT
BRUSH SCRUB EZ 1% IODOPHOR (MISCELLANEOUS) ×3 IMPLANT
CATH FOL 2WAY LX 16X5 (CATHETERS) ×2 IMPLANT
CATH URETL OPEN 5X70 (CATHETERS) IMPLANT
GAUZE 4X4 16PLY ~~LOC~~+RFID DBL (SPONGE) ×6 IMPLANT
GLOVE SURG UNDER POLY LF SZ7.5 (GLOVE) ×3 IMPLANT
GOWN STRL REUS W/ TWL XL LVL3 (GOWN DISPOSABLE) ×2 IMPLANT
GOWN STRL REUS W/TWL XL LVL3 (GOWN DISPOSABLE) ×3
GUIDEWIRE STR DUAL SENSOR (WIRE) ×3 IMPLANT
IV NS IRRIG 3000ML ARTHROMATIC (IV SOLUTION) ×3 IMPLANT
KIT TURNOVER CYSTO (KITS) ×3 IMPLANT
MANIFOLD NEPTUNE II (INSTRUMENTS) ×3 IMPLANT
PACK CYSTO AR (MISCELLANEOUS) ×3 IMPLANT
SET CYSTO W/LG BORE CLAMP LF (SET/KITS/TRAYS/PACK) ×3 IMPLANT
STENT URET 6FRX24 CONTOUR (STENTS) IMPLANT
STENT URET 6FRX26 CONTOUR (STENTS) IMPLANT
STENT URO INLAY 6FRX24CM (STENTS) ×2 IMPLANT
SURGILUBE 2OZ TUBE FLIPTOP (MISCELLANEOUS) ×3 IMPLANT
WATER STERILE IRR 1000ML POUR (IV SOLUTION) ×3 IMPLANT
WATER STERILE IRR 500ML POUR (IV SOLUTION) ×3 IMPLANT

## 2022-02-06 NOTE — Transfer of Care (Signed)
Immediate Anesthesia Transfer of Care Note ? ?Patient: Stephanie [name] ? ?Procedure(s) Performed: CYSTOSCOPY WITH STENT EXCHANGE (Right) ? ?Patient Location: PACU ? ?Anesthesia Type:General ? ?Level of Consciousness: awake, drowsy and patient cooperative ? ?Airway & Oxygen Therapy: Patient Spontanous Breathing and Patient connected to face mask oxygen ? ?Post-op Assessment: Report given to RN and Post -op Vital signs reviewed and stable ? ?Post vital signs: Reviewed and stable ? ?Last Vitals:  ?Vitals Value Taken Time  ?BP 169/65 02/06/22 1702  ?Temp    ?Pulse 63 02/06/22 1705  ?Resp 17 02/06/22 1705  ?SpO2 99 % 02/06/22 1705  ?Vitals shown include unvalidated device data. ? ?Last Pain:  ?Vitals:  ? 02/06/22 1212  ?TempSrc: Oral  ?PainSc: 0-No pain  ?   ? ?  ? ?Complications: No notable events documented. ?

## 2022-02-06 NOTE — Anesthesia Preprocedure Evaluation (Addendum)
Anesthesia Evaluation  ?Patient identified by MRN, date of birth, ID band ?Patient awake ? ? ? ?Reviewed: ?Allergy & Precautions, NPO status , Patient's Chart, lab work & pertinent test results ? ?History of Anesthesia Complications ?(+) PONV and history of anesthetic complications ? ?Airway ?Mallampati: III ? ? ?Neck ROM: Full ? ? ? Dental ?no notable dental hx. ? ?  ?Pulmonary ?asthma ,  ?  ?Pulmonary exam normal ?breath sounds clear to auscultation ? ? ? ? ? ? Cardiovascular ?hypertension, Normal cardiovascular exam+ dysrhythmias (a fib)  ?Rhythm:Regular Rate:Normal ? ?ECG 02/05/22:  ?Normal sinus rhythm ?Left axis deviation ?Right bundle branch block ?  ?Neuro/Psych ?negative neurological ROS ?   ? GI/Hepatic ?GERD  ,Colon CA ?  ?Endo/Other  ?diabetes, Type 2 ? Renal/GU ?Renal disease (stage III CKD)  ? ?  ?Musculoskeletal ? ? Abdominal ?  ?Peds ? Hematology ? ?(+) Blood dyscrasia, anemia ,   ?Anesthesia Other Findings ? ? Reproductive/Obstetrics ? ?  ? ? ? ? ? ? ? ? ? ? ? ? ? ?  ?  ? ? ? ? ? ? ? ?Anesthesia Physical ?Anesthesia Plan ? ?ASA: 3 ? ?Anesthesia Plan: General  ? ?Post-op Pain Management:   ? ?Induction: Intravenous ? ?PONV Risk Score and Plan: 4 or greater and Ondansetron, Dexamethasone and Treatment may vary due to age or medical condition ? ?Airway Management Planned: LMA ? ?Additional Equipment:  ? ?Intra-op Plan:  ? ?Post-operative Plan: Extubation in OR ? ?Informed Consent: I have reviewed the patients History and Physical, chart, labs and discussed the procedure including the risks, benefits and alternatives for the proposed anesthesia with the patient or authorized representative who has indicated his/her understanding and acceptance.  ? ? ? ?Dental advisory given ? ?Plan Discussed with: CRNA ? ?Anesthesia Plan Comments: (Patient consented for risks of anesthesia including but not limited to:  ?- adverse reactions to medications ?- damage to eyes, teeth, lips or  other oral mucosa ?- nerve damage due to positioning  ?- sore throat or hoarseness ?- damage to heart, brain, nerves, lungs, other parts of body or loss of life ? ?Informed patient about role of CRNA in peri- and intra-operative care.  Patient voiced understanding.)  ? ? ? ? ? ? ?Anesthesia Quick Evaluation ? ?

## 2022-02-06 NOTE — Op Note (Signed)
Preoperative diagnosis:  ?Chronic right UPJ obstruction ?Chronic urinary retention ?Recurrent UTI ? ?Postoperative diagnosis:  ?Same ? ?Procedure: ?Cystoscopy ?Right ureteral stent exchange (65F/24 cm Bard Optima) ?Right retrograde pyelogram with interpretation ?Intraoperative fluoroscopy < 60 minutes ? ?Surgeon: Abbie Sons, MD ? ?Anesthesia: General ? ?Complications: None ? ?Intraoperative findings:  ?Cystoscopy-inflammatory changes right hemitrigone secondary to indwelling stent and bladder base secondary to chronic indwelling Foley.  No solid or papillary lesions identified ?Right retrograde pyelogram-moderate to severe right hydronephrosis without visualization of the ureter consistent with UPJ obstruction ? ?EBL: Minimal ? ?Specimens: None ? ?Indication: Stephanie [name] is a 82 y.o. 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  Her stent was last exchanged October 2020 however recently ultrasound showed worsening hydronephrosis.  After reviewing the management options for treatment, he elected to proceed with the above surgical procedure(s). We have discussed the potential benefits and risks of the procedure, side effects of the proposed treatment, the likelihood of the patient achieving the goals of the procedure, and any potential problems that might occur during the procedure or recuperation. Informed consent has been obtained. ? ?Description of procedure: ? ?The patient was taken to the operating room and general anesthesia was induced.  The patient was placed in the dorsal lithotomy position, prepped and draped in the usual sterile fashion, and preoperative antibiotics were administered. A preoperative time-out was performed.  ? ?A 21 French cystoscope was lubricated and passed per urethra.  Panendoscopy was performed with findings as described above.  The right ureteral stent was grasped with endoscopic forceps and brought out to the urethral meatus.  A 0.038 Sensor wire  was then placed to the stent and advanced to the renal pelvis under fluoroscopic guidance without difficulty.  The previous stent was removed and a 5 [place] open-ended ureteral catheter was placed over the guidewire to the renal pelvis under fluoroscopic guidance.  The guidewire was removed and retrograde pyelogram was performed with findings as described above.  The Sensor wire was replaced and the ureteral catheter was removed. ? ?A 65F/24 cm Bard Optima ureteral stent was then placed on fluoroscopic guidance.  There was a good curl seen in the renal pelvis and bladder on fluoroscopy. ? ?A 16 French Foley catheter was placed and inflated with 10 mL sterile water.  The catheter was placed to gravity drainage. ? ?After anesthetic reversal she was transported to the PACU in stable condition. ? ?Plan: ?Office follow-up 6 months or earlier for flank pain, fever ? ? ?Abbie Sons, M.D. ? ?

## 2022-02-06 NOTE — Discharge Instructions (Addendum)
AMBULATORY SURGERY  ?DISCHARGE INSTRUCTIONS ? ? ?The drugs that you were given will stay in your system until tomorrow so for the next 24 hours you should not: ? ?Drive an automobile ?Make any legal decisions ?Drink any alcoholic beverage ? ? ?You may resume regular meals tomorrow.  Today it is better to start with liquids and gradually work up to solid foods. ? ?You may eat anything you prefer, but it is better to start with liquids, then soup and crackers, and gradually work up to solid foods. ? ? ?Please notify your doctor immediately if you have any unusual bleeding, trouble breathing, redness and pain at the surgery site, drainage, fever, or pain not relieved by medication. ? ? ? ?Additional Instructions: ? ? ?DISCHARGE INSTRUCTIONS FOR KIDNEY STONE/URETERAL STENT  ? ?MEDICATIONS:  ?1. Resume all your other meds from home.  ? ? ?ACTIVITY:  ?1. May resume regular activities in 24 hours. ?2. No driving while on narcotic pain medications  ?3. Drink plenty of water  ?4. Continue to walk at home - you can still get blood clots when you are at home, so keep active, but don't over do it.  ?5. May return to work/school tomorrow or when you feel ready  ? ? ?SIGNS/SYMPTOMS TO CALL:  ?Common postoperative symptoms include urinary frequency, urgency, bladder spasm and blood in the urine ? ?Please call us if you have a fever greater than 101.5, uncontrolled nausea/vomiting, uncontrolled pain, dizziness, unable to urinate, excessively bloody urine, chest pain, shortness of breath, leg swelling, leg pain, or any other concerns or questions.  ? ?You can reach [place] at [phone].  ? ?FOLLOW-UP:  ?1. You will be contacted for a follow up appt ? ? ? ? ? ? ?Please contact your physician with any problems or Same Day Surgery at [phone], Monday through Friday 6 am to 4 pm, or [place] at [facility] number at [phone].  ?

## 2022-02-06 NOTE — Anesthesia Procedure Notes (Signed)
Procedure Name: LMA Insertion ?Date/Time: 02/06/2022 4:37 PM ?Performed by: Kelton Pillar, CRNA ?Pre-anesthesia Checklist: Patient identified, Emergency Drugs available, Suction available and Patient being monitored ?Patient Re-evaluated:Patient Re-evaluated prior to induction ?Oxygen Delivery Method: Circle system utilized ?Preoxygenation: Pre-oxygenation with 100% oxygen ?Induction Type: IV induction ?Ventilation: Mask ventilation without difficulty ?LMA: LMA flexible inserted ?LMA Size: 3.5 ?Placement Confirmation: positive ETCO2, CO2 detector and breath sounds checked- equal and bilateral ?Tube secured with: Tape ?Dental Injury: Teeth and Oropharynx as per pre-operative assessment  ? ? ? ? ?

## 2022-02-06 NOTE — Interval H&P Note (Signed)
History and Physical Interval Note: ? ?Patient presents for stent exchange.  Urine culture with bacteriuria.  She is asymptomatic.  Has been on antibiotic therapy. ? ?CV: RRR ?Lungs: Clear ? ?02/06/2022 ?4:29 PM ? ?Stephanie [name]  has presented today for surgery, with the diagnosis of Right Hydronephrosis.  The various methods of treatment have been discussed with the patient and family. After consideration of risks, benefits and other options for treatment, the patient has consented to  Procedure(s): ?[place] (Right) as a surgical intervention.  The patient's history has been reviewed, patient examined, no change in status, stable for surgery.  I have reviewed the patient's chart and labs.  Questions were answered to the patient's satisfaction.   ? ? ?[name] C [name] ? ? ?

## 2022-02-07 ENCOUNTER — Encounter: Payer: Self-pay | Admitting: Urology

## 2022-02-07 ENCOUNTER — Telehealth: Payer: Self-pay

## 2022-02-07 NOTE — Anesthesia Postprocedure Evaluation (Signed)
Anesthesia Post Note ? ?Patient: Stephanie [name] ? ?Procedure(s) Performed: CYSTOSCOPY WITH RETROGRADE PYELOGRAM/URETERAL STENT EXCHANGE ? ?Patient location during evaluation: PACU ?Anesthesia Type: General ?Level of consciousness: awake and alert ?Pain management: pain level controlled ?Vital Signs Assessment: post-procedure vital signs reviewed and stable ?Respiratory status: spontaneous breathing, nonlabored ventilation, respiratory function stable and patient connected to nasal cannula oxygen ?Cardiovascular status: blood pressure returned to baseline and stable ?Postop Assessment: no apparent nausea or vomiting ?Anesthetic complications: no ? ? ?No notable events documented. ? ? ?Last Vitals:  ?Vitals:  ? 02/06/22 1800 02/06/22 1811  ?BP: (!) 184/77 (!) 174/67  ?Pulse: 77 83  ?Resp: (!) 26 16  ?Temp:  (!) 36.1 ?C  ?SpO2: 97% 97%  ?  ?Last Pain:  ?Vitals:  ? 02/06/22 1811  ?TempSrc:   ?PainSc: 0-No pain  ? ? ?  ?  ?  ?  ?  ?  ? ?Martha Clan ? ? ? ? ?

## 2022-02-07 NOTE — Progress Notes (Signed)
Patient stated that her blood glucose was 396 this morning and was upset about it. I told them to keep an eye on the blood sugar today if it is still elevated. ?

## 2022-02-07 NOTE — Telephone Encounter (Signed)
Incoming call on triage line from pt's husband in regards to pts sugars running high post stent exchange. He reports pt does not have any symptoms what so ever but her sugars are abnormally high. Advised him sometimes this can happen post anesthesia and they should let pt's PCP know as they are the managers of pt's blood sugar. He expressed understanding and states he will let the PCP know.  ?

## 2022-03-05 NOTE — Progress Notes (Unsigned)
Cath Change/ Replacement  Patient is present today for a catheter change due to urinary retention.  8 ml of water was removed from the balloon, a 16 FR foley cath was removed with out difficulty.  Patient was cleaned and prepped in a sterile fashion with betadine. A 16 FR foley cath was replaced into the bladder no complications were noted Urine return was noted 20 ml and urine was yellow in color. The balloon was filled with 5m of sterile water. A leg bag was attached for drainage.  Patient was given proper instruction on catheter care.    Performed by: SZara Council PA-C   Follow up: One month for Foley catheter exchange

## 2022-03-06 ENCOUNTER — Encounter: Payer: Self-pay | Admitting: Urology

## 2022-03-06 ENCOUNTER — Ambulatory Visit (INDEPENDENT_AMBULATORY_CARE_PROVIDER_SITE_OTHER): Payer: Medicare Other | Admitting: Urology

## 2022-03-06 DIAGNOSIS — R339 Retention of urine, unspecified: Secondary | ICD-10-CM

## 2022-03-06 DIAGNOSIS — Z978 Presence of other specified devices: Secondary | ICD-10-CM

## 2022-03-09 ENCOUNTER — Emergency Department: Payer: Medicare Other

## 2022-03-09 ENCOUNTER — Encounter: Payer: Self-pay | Admitting: Internal Medicine

## 2022-03-09 ENCOUNTER — Inpatient Hospital Stay
Admission: EM | Admit: 2022-03-09 | Discharge: 2022-03-13 | DRG: 698 | Disposition: A | Discharge status: Home Health | Payer: Medicare Other | Attending: Osteopathic Medicine | Admitting: Osteopathic Medicine

## 2022-03-09 DIAGNOSIS — B961 Klebsiella pneumoniae [K. pneumoniae] as the cause of diseases classified elsewhere: Secondary | ICD-10-CM | POA: Diagnosis present

## 2022-03-09 DIAGNOSIS — Y846 Urinary catheterization as the cause of abnormal reaction of the patient, or of later complication, without mention of misadventure at the time of the procedure: Secondary | ICD-10-CM | POA: Diagnosis present

## 2022-03-09 DIAGNOSIS — N136 Pyonephrosis: Secondary | ICD-10-CM | POA: Diagnosis present

## 2022-03-09 DIAGNOSIS — T83511A Infection and inflammatory reaction due to indwelling urethral catheter, initial encounter: Secondary | ICD-10-CM | POA: Diagnosis present

## 2022-03-09 DIAGNOSIS — N1832 Chronic kidney disease, stage 3b: Secondary | ICD-10-CM | POA: Diagnosis present

## 2022-03-09 DIAGNOSIS — Z888 Allergy status to other drugs, medicaments and biological substances status: Secondary | ICD-10-CM | POA: Diagnosis not present

## 2022-03-09 DIAGNOSIS — K219 Gastro-esophageal reflux disease without esophagitis: Secondary | ICD-10-CM | POA: Diagnosis present

## 2022-03-09 DIAGNOSIS — I480 Paroxysmal atrial fibrillation: Secondary | ICD-10-CM | POA: Diagnosis present

## 2022-03-09 DIAGNOSIS — M0579 Rheumatoid arthritis with rheumatoid factor of multiple sites without organ or systems involvement: Secondary | ICD-10-CM | POA: Diagnosis present

## 2022-03-09 DIAGNOSIS — E782 Mixed hyperlipidemia: Secondary | ICD-10-CM | POA: Diagnosis present

## 2022-03-09 DIAGNOSIS — D649 Anemia, unspecified: Secondary | ICD-10-CM | POA: Diagnosis present

## 2022-03-09 DIAGNOSIS — Z90710 Acquired absence of both cervix and uterus: Secondary | ICD-10-CM | POA: Diagnosis not present

## 2022-03-09 DIAGNOSIS — Z85038 Personal history of other malignant neoplasm of large intestine: Secondary | ICD-10-CM

## 2022-03-09 DIAGNOSIS — Z7960 Long term (current) use of unspecified immunomodulators and immunosuppressants: Secondary | ICD-10-CM

## 2022-03-09 DIAGNOSIS — B9620 Unspecified Escherichia coli [E. coli] as the cause of diseases classified elsewhere: Secondary | ICD-10-CM | POA: Diagnosis present

## 2022-03-09 DIAGNOSIS — Z8551 Personal history of malignant neoplasm of bladder: Secondary | ICD-10-CM

## 2022-03-09 DIAGNOSIS — E1122 Type 2 diabetes mellitus with diabetic chronic kidney disease: Secondary | ICD-10-CM | POA: Diagnosis present

## 2022-03-09 DIAGNOSIS — Z7984 Long term (current) use of oral hypoglycemic drugs: Secondary | ICD-10-CM

## 2022-03-09 DIAGNOSIS — I129 Hypertensive chronic kidney disease with stage 1 through stage 4 chronic kidney disease, or unspecified chronic kidney disease: Secondary | ICD-10-CM | POA: Diagnosis present

## 2022-03-09 DIAGNOSIS — A419 Sepsis, unspecified organism: Secondary | ICD-10-CM | POA: Diagnosis present

## 2022-03-09 DIAGNOSIS — Z960 Presence of urogenital implants: Secondary | ICD-10-CM

## 2022-03-09 DIAGNOSIS — E1169 Type 2 diabetes mellitus with other specified complication: Secondary | ICD-10-CM

## 2022-03-09 DIAGNOSIS — N390 Urinary tract infection, site not specified: Secondary | ICD-10-CM | POA: Diagnosis not present

## 2022-03-09 DIAGNOSIS — Z885 Allergy status to narcotic agent status: Secondary | ICD-10-CM | POA: Diagnosis not present

## 2022-03-09 DIAGNOSIS — Z978 Presence of other specified devices: Secondary | ICD-10-CM

## 2022-03-09 DIAGNOSIS — Z20822 Contact with and (suspected) exposure to covid-19: Secondary | ICD-10-CM | POA: Diagnosis present

## 2022-03-09 DIAGNOSIS — D84821 Immunodeficiency due to drugs: Secondary | ICD-10-CM | POA: Diagnosis present

## 2022-03-09 DIAGNOSIS — N184 Chronic kidney disease, stage 4 (severe): Secondary | ICD-10-CM | POA: Diagnosis present

## 2022-03-09 DIAGNOSIS — I10 Essential (primary) hypertension: Secondary | ICD-10-CM | POA: Diagnosis present

## 2022-03-09 DIAGNOSIS — E876 Hypokalemia: Secondary | ICD-10-CM

## 2022-03-09 DIAGNOSIS — Z79899 Other long term (current) drug therapy: Secondary | ICD-10-CM

## 2022-03-09 DIAGNOSIS — N10 Acute tubulo-interstitial nephritis: Secondary | ICD-10-CM | POA: Diagnosis present

## 2022-03-09 DIAGNOSIS — N1330 Unspecified hydronephrosis: Secondary | ICD-10-CM | POA: Diagnosis present

## 2022-03-09 LAB — CBC
HCT: 32.2 % — ABNORMAL LOW (ref 36.0–46.0)
Hemoglobin: 9.8 g/dL — ABNORMAL LOW (ref 12.0–15.0)
MCH: 27.4 pg (ref 26.0–34.0)
MCHC: 30.4 g/dL (ref 30.0–36.0)
MCV: 89.9 fL (ref 80.0–100.0)
Platelets: 219 10*3/uL (ref 150–400)
RBC: 3.58 MIL/uL — ABNORMAL LOW (ref 3.87–5.11)
RDW: 15.9 % — ABNORMAL HIGH (ref 11.5–15.5)
WBC: 15.6 10*3/uL — ABNORMAL HIGH (ref 4.0–10.5)
nRBC: 0 % (ref 0.0–0.2)

## 2022-03-09 LAB — COMPREHENSIVE METABOLIC PANEL
ALT: 15 U/L (ref 0–44)
AST: 22 U/L (ref 15–41)
Albumin: 3.3 g/dL — ABNORMAL LOW (ref 3.5–5.0)
Alkaline Phosphatase: 97 U/L (ref 38–126)
Anion gap: 7 (ref 5–15)
BUN: 29 mg/dL — ABNORMAL HIGH (ref 8–23)
CO2: 21 mmol/L — ABNORMAL LOW (ref 22–32)
Calcium: 8.7 mg/dL — ABNORMAL LOW (ref 8.9–10.3)
Chloride: 110 mmol/L (ref 98–111)
Creatinine, Ser: 1.64 mg/dL — ABNORMAL HIGH (ref 0.44–1.00)
GFR, Estimated: 32 mL/min — ABNORMAL LOW (ref >60)
Glucose, Bld: 184 mg/dL — ABNORMAL HIGH (ref 70–99)
Potassium: 4.2 mmol/L (ref 3.5–5.1)
Sodium: 138 mmol/L (ref 135–145)
Total Bilirubin: 0.8 mg/dL (ref 0.3–1.2)
Total Protein: 6.2 g/dL — ABNORMAL LOW (ref 6.5–8.1)

## 2022-03-09 LAB — URINALYSIS, ROUTINE W REFLEX MICROSCOPIC
Bilirubin Urine: NEGATIVE
Glucose, UA: NEGATIVE mg/dL
Hgb urine dipstick: NEGATIVE
Ketones, ur: NEGATIVE mg/dL
Nitrite: NEGATIVE
Protein, ur: >=300 mg/dL — AB
Specific Gravity, Urine: 1.01 (ref 1.005–1.030)
WBC, UA: >50 WBC/hpf — ABNORMAL HIGH (ref 0–5)
pH: 6 (ref 5.0–8.0)

## 2022-03-09 LAB — RESP PANEL BY RT-PCR (FLU A&B, COVID) ARPGX2
Influenza A by PCR: NEGATIVE
Influenza B by PCR: NEGATIVE
SARS Coronavirus 2 by RT PCR: NEGATIVE

## 2022-03-09 LAB — LACTIC ACID, PLASMA: Lactic Acid, Venous: 2.2 mmol/L (ref 0.5–1.9)

## 2022-03-09 LAB — LIPASE, BLOOD: Lipase: 28 U/L (ref 11–51)

## 2022-03-09 MED ORDER — ROSUVASTATIN CALCIUM 10 MG PO TABS
10.0000 mg | ORAL_TABLET | Freq: Every day | ORAL | Status: DC
  Administered 2022-03-10 – 2022-03-13 (×4): 10 mg via ORAL
  Filled 2022-03-09 (×4): qty 1

## 2022-03-09 MED ORDER — LACTATED RINGERS IV BOLUS
1000.0000 mL | Freq: Once | INTRAVENOUS | Status: AC
Start: 2022-03-09 — End: 2022-03-10
  Administered 2022-03-10: 1000 mL via INTRAVENOUS

## 2022-03-09 MED ORDER — ACETAMINOPHEN 650 MG RE SUPP: 650.0000 mg | Freq: Four times a day (QID) | RECTAL | Status: DC | PRN

## 2022-03-09 MED ORDER — LACTATED RINGERS IV SOLN: INTRAVENOUS | Status: AC

## 2022-03-09 MED ORDER — DILTIAZEM HCL ER COATED BEADS 120 MG PO CP24
120.0000 mg | ORAL_CAPSULE | Freq: Every day | ORAL | Status: DC
  Administered 2022-03-10 – 2022-03-12 (×3): 120 mg via ORAL
  Filled 2022-03-09 (×3): qty 1

## 2022-03-09 MED ORDER — ONDANSETRON HCL 4 MG PO TABS: 4.0000 mg | ORAL_TABLET | Freq: Four times a day (QID) | ORAL | Status: DC | PRN

## 2022-03-09 MED ORDER — METOPROLOL TARTRATE 50 MG PO TABS
50.0000 mg | ORAL_TABLET | Freq: Two times a day (BID) | ORAL | Status: DC
  Administered 2022-03-10 – 2022-03-13 (×7): 50 mg via ORAL
  Filled 2022-03-09 (×7): qty 1

## 2022-03-09 MED ORDER — ACETAMINOPHEN 325 MG PO TABS: 650.0000 mg | ORAL_TABLET | Freq: Four times a day (QID) | ORAL | Status: DC | PRN

## 2022-03-09 MED ORDER — HYDROCODONE-ACETAMINOPHEN 5-325 MG PO TABS: 1.0000 | ORAL_TABLET | ORAL | Status: DC | PRN

## 2022-03-09 MED ORDER — PANTOPRAZOLE SODIUM 40 MG PO TBEC
40.0000 mg | DELAYED_RELEASE_TABLET | Freq: Every day | ORAL | Status: DC
  Administered 2022-03-10 – 2022-03-13 (×4): 40 mg via ORAL
  Filled 2022-03-09 (×4): qty 1

## 2022-03-09 MED ORDER — ONDANSETRON HCL 4 MG/2ML IJ SOLN: 4.0000 mg | Freq: Four times a day (QID) | INTRAMUSCULAR | Status: DC | PRN

## 2022-03-09 MED ORDER — INSULIN ASPART 100 UNIT/ML IJ SOLN
0.0000 [IU] | INTRAMUSCULAR | Status: DC
  Administered 2022-03-10: 3 [IU] via SUBCUTANEOUS
  Administered 2022-03-10: 2 [IU] via SUBCUTANEOUS
  Administered 2022-03-10 (×2): 3 [IU] via SUBCUTANEOUS
  Administered 2022-03-11 (×2): 2 [IU] via SUBCUTANEOUS
  Filled 2022-03-09 (×6): qty 1

## 2022-03-09 MED ORDER — SODIUM CHLORIDE 0.9 % IV SOLN
2.0000 g | Freq: Once | INTRAVENOUS | Status: AC
  Administered 2022-03-09: 2 g via INTRAVENOUS
  Filled 2022-03-09: qty 12.5

## 2022-03-09 NOTE — Assessment & Plan Note (Signed)
Slightly elevated. Improved this AM  Will need to add agent at this point, BP have been high despite home diltiazem and metoprolol

## 2022-03-09 NOTE — Consult Note (Signed)
PHARMACY -  BRIEF ANTIBIOTIC NOTE   Pharmacy has received consult(s) for Cefepime from an ED provider.  The patient's profile has been reviewed for ht/wt/allergies/indication/available labs.    One time order(s) placed for Cefepime 2gm x 1  Further antibiotics/pharmacy consults should be ordered by admitting physician if indicated.                       Thank you, Alhaji Mcneal Rodriguez-Guzman PharmD, BCPS 03/09/2022 9:32 PM

## 2022-03-09 NOTE — Assessment & Plan Note (Deleted)
Right ureteral stent s/p exchange on 02/06/2022 Indwelling Foley catheter s/p exchange on 03/06/2022 Managed by urology.  Stent exchange on 5/9 Consider urology consult for possible stent exchange in the setting of acute pyelonephritis

## 2022-03-09 NOTE — Assessment & Plan Note (Addendum)
Renal function at baseline on admission  Follow BMP

## 2022-03-09 NOTE — H&P (Signed)
History and Physical    Patient: Stephanie [name] TDD:[id number] DOB: 01-05-1941 DOA: 03/09/2022 DOS: the patient was seen and examined on 03/09/2022 PCP: Rusty Aus, MD  Patient coming from: Home  Chief Complaint:  Chief Complaint  Patient presents with   Fever   Shortness of Breath   Nausea    HPI: Stephanie [name] is a 81 y.o. female with medical history significant for Rheumatoid arthritis on chronic immunosuppressive therapy, DM, HTN, remote history of colon cancer and bladder cancer, CKD 3B,A-fib not on anticoagulation, right hydronephrosis with ureteral stent most recently exchanged on 5/9 and with chronic indwelling Foley exchanged on 6/6, and history of SBO who presents to the ED with fever, chills and generalized malaise.  She has associated nausea without vomiting and mild shortness of breath.  Denies abdominal pain or diarrhea,  vomiting.  She denies cough  or chest pain.  Had a recent E. coli UTI and had been on Bactrim. ED course and data review: Tmax 102.4, pulse 100, respirations 24 BP 175/79 and O2 sat 98% on room air Labs with WBC 15,600 and lactic acid 2.2 with urinalysis strongly consistent with UTI.  Hemoglobin 9.8 which is about baseline and up from 8.6 a month ago.  Creatinine 1.64, at baseline.  COVID and flu negative. Chest x-ray nonacute. CT renal stone study showing "Right ureteral stent in grossly appropriate position. Nonspecific asymmetric right perinephric stranding with markedly limited evaluation on this noncontrast study. Recommend correlation with urinalysis for superimposed infection"  Patient started on cefepime and sepsis fluids.  Hospitalist consulted for admission.   Review of Systems: As mentioned in the history of present illness. All other systems reviewed and are negative.  Past Medical History:  Diagnosis Date   Anemia    Arthritis    Asthma    Cancer ([place]) 1996   colon   Chronic kidney disease    Complication of anesthesia    nausea    Diabetes mellitus without complication ([place])    Dysrhythmia    Foley catheter in place    GERD (gastroesophageal reflux disease)    Glaucoma    Hyperlipidemia    Hypertension    Lumbar disc disease    Pneumonia    PONV (postoperative nausea and vomiting)    Small bowel obstruction (HCC)    Urinary retention    Uses walker    Vitamin B 12 deficiency    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    has, does not wear   Past Surgical History:  Procedure Laterality Date   ABDOMINAL HYSTERECTOMY     BACK SURGERY     x5  "screws and rods"   BREAST BIOPSY Right 11/01/2021   rt br biopsy calcs ribbon clip path pending 2nd site LIQ   BREAST BIOPSY Right 11/01/2021   rt br stereo calcs coil clip 1st site path pending UIQ   COLON SURGERY     COLONOSCOPY WITH PROPOFOL N/A 05/22/2017   Procedure: COLONOSCOPY WITH PROPOFOL;  Surgeon: Manya Silvas, MD;  Location: [facility] ENDOSCOPY;  Service: Endoscopy;  Laterality: N/A;   COLONOSCOPY WITH PROPOFOL N/A 06/08/2021   Procedure: COLONOSCOPY WITH PROPOFOL;  Surgeon: Lucilla Lame, MD;  Location: [place];  Service: Endoscopy;  Laterality: N/A;  Diabetic   CYSTOSCOPY W/ URETERAL STENT PLACEMENT  07/25/2021   Procedure: CYSTOSCOPY WITH RETROGRADE PYELOGRAM/URETERAL STENT PLACEMENT;  Surgeon: Abbie Sons, MD;  Location: [facility] ORS;  Service: Urology;;   CYSTOSCOPY W/ URETERAL [place]  02/06/2022   Procedure: CYSTOSCOPY WITH RETROGRADE PYELOGRAM/URETERAL STENT EXCHANGE;  Surgeon: Abbie Sons, MD;  Location: [facility];  Service: Urology;;   CYSTOSCOPY WITH STENT PLACEMENT Right 08/02/2020   Procedure: CYSTOSCOPY WITH STENT EXCHANGE;  Surgeon: Abbie Sons, MD;  Location: [facility];  Service: Urology;  Laterality: Right;   CYSTOSCOPY WITH STENT PLACEMENT Right 06/18/2020   Procedure: CYSTOSCOPY WITH STENT PLACEMENT;  Surgeon: Irine Seal, MD;  Location: [facility];  Service: Urology;  Laterality: Right;   EYE SURGERY Bilateral     Cataracts   FRACTURE SURGERY Right 2011   foot surgery   JOINT REPLACEMENT     KNEE ARTHROSCOPY Bilateral    LAPAROTOMY N/A 06/30/2020   Procedure: EXPLORATORY LAPAROTOMY;  Surgeon: Jules Husbands, MD;  Location: [facility]S;  Service: General;  Laterality: N/A;   LUMBAR LAMINECTOMY     POLYPECTOMY  06/08/2021   Procedure: POLYPECTOMY;  Surgeon: Lucilla Lame, MD;  Location: [place];  Service: Endoscopy;;   TEMPORARY DIALYSIS CATHETER N/A 06/27/2020   Procedure: TEMPORARY DIALYSIS CATHETER;  Surgeon: Katha Cabal, MD;  Location: [facility];  Service: Cardiovascular;  Laterality: N/A;   Social History:  reports that she has never smoked. She has never used smokeless tobacco. She reports that she does not drink alcohol and does not use drugs.  Allergies  Allergen Reactions   Infliximab Hives, Shortness Of Breath and Itching   Ibuprofen Itching   Indomethacin Other (See Comments)    headache   Methotrexate Derivatives Other (See Comments)    Headache   Moexipril Other (See Comments)    unknown   Percocet [Oxycodone-Acetaminophen] Itching   Naprosyn [Naproxen] Rash    Family History  Problem Relation Age of Onset   Breast cancer Neg Hx     Prior to Admission medications   Medication Sig Start Date End Date Taking? Authorizing Provider  acetaminophen (TYLENOL) 325 MG tablet Take 2 tablets (650 mg total) by mouth every 6 (six) hours as needed for moderate pain, fever or headache. 07/10/20   Ezekiel Slocumb, DO  ascorbic acid (VITAMIN C) 500 MG tablet Take 1 tablet (500 mg total) by mouth daily. 07/10/20   Nicole Kindred A, DO  brimonidine (ALPHAGAN) 0.2 % ophthalmic solution Place 1 drop into both eyes 2 (two) times daily. 05/31/20   [provider]  cefdinir (OMNICEF) 300 MG capsule Take 1 capsule (300 mg total) by mouth 2 (two) times daily. 02/04/22   McGowan, Larene Beach A, PA-C  CONTOUR TEST test strip 1 each by Other route daily.  03/09/18   [provider]  Cranberry 400 MG CAPS Take 400 mg by mouth daily.  03/24/20   [provider]  diltiazem (CARDIZEM CD) 120 MG 24 hr capsule Take 120 mg by mouth in the morning and at bedtime. 01/26/22   [provider]  glipiZIDE (GLUCOTROL) 10 MG tablet Take 5 mg by mouth 2 (two) times daily before a meal.    [provider]  Iron-Vitamin C (VITRON-C) 65-125 MG TABS Take 1 tablet by mouth daily.    [provider]  leflunomide (ARAVA) 10 MG tablet Take 10 mg by mouth every other day.    [provider]  Magnesium Oxide 250 MG TABS Take 250 mg by mouth daily.    [provider]  metFORMIN (GLUCOPHAGE-XR) 500 MG 24 hr tablet Take 500 mg by mouth every evening. 09/28/21   [provider]  metoprolol tartrate (LOPRESSOR)  50 MG tablet Take 50 mg by mouth 2 (two) times daily.    [provider]  mirabegron ER (MYRBETRIQ) 25 MG TB24 tablet Take 1 tablet (25 mg total) by mouth daily. Patient not taking: Reported on 02/01/2022 09/26/21   Debroah Loop, PA-C  omeprazole (PRILOSEC) 40 MG capsule Take 40 mg by mouth daily. 09/11/20   [provider]  Polyvinyl Alcohol (LIQUID TEARS OP) Place 1 drop into both eyes daily as needed (Dry eyes).    [provider]  Probiotic Product (PROBIOTIC-10 PO) Take 1 capsule by mouth daily.    [provider]  rosuvastatin (CRESTOR) 10 MG tablet Take 10 mg by mouth daily.  03/24/20   [provider]  tacrolimus (PROTOPIC) 0.1 % ointment Apply 1 application. topically daily as needed (irritation).    [provider]  triamcinolone (KENALOG) 0.025 % ointment Apply 1 application. topically 2 (two) times daily. Patient taking differently: Apply 1 application. topically 2 (two) times daily as needed (irritation). 12/22/21   Zara Council A, PA-C  vitamin B-12 (CYANOCOBALAMIN) 1000 MCG tablet Take 1,000 mcg by mouth daily.    [provider]     Physical Exam: Vitals:   03/09/22 2037 03/09/22 2040 03/09/22 2059  BP:  (!) 175/79 (!) 152/55  Pulse:  100 81  Resp:  (!) 24 19  Temp:  (!) 102.4 F (39.1 C) (!) 100.6 F (38.1 C)  TempSrc:  Oral Oral  SpO2: 99% 98% 97%   Physical Exam  Labs on Admission: I have personally reviewed following labs and imaging studies  CBC: Recent Labs  Lab 03/09/22 2041  WBC 15.6*  HGB 9.8*  HCT 32.2*  MCV 89.9  PLT 295   Basic Metabolic Panel: Recent Labs  Lab 03/09/22 2041  NA 138  K 4.2  CL 110  CO2 21*  GLUCOSE 184*  BUN 29*  CREATININE 1.64*  CALCIUM 8.7*   GFR: CrCl cannot be calculated (Unknown ideal weight.). Liver Function Tests: Recent Labs  Lab 03/09/22 2041  AST 22  ALT 15  ALKPHOS 97  BILITOT 0.8  PROT 6.2*  ALBUMIN 3.3*   Recent Labs  Lab 03/09/22 2041  LIPASE 28   No results for input(s): "AMMONIA" in the last 168 hours. Coagulation Profile: No results for input(s): "INR", "PROTIME" in the last 168 hours. Cardiac Enzymes: No results for input(s): "CKTOTAL", "CKMB", "CKMBINDEX", "TROPONINI" in the last 168 hours. BNP (last 3 results) No results for input(s): "PROBNP" in the last 8760 hours. HbA1C: No results for input(s): "HGBA1C" in the last 72 hours. CBG: No results for input(s): "GLUCAP" in the last 168 hours. Lipid Profile: No results for input(s): "CHOL", "HDL", "LDLCALC", "TRIG", "CHOLHDL", "LDLDIRECT" in the last 72 hours. Thyroid Function Tests: No results for input(s): "TSH", "T4TOTAL", "FREET4", "T3FREE", "THYROIDAB" in the last 72 hours. Anemia Panel: No results for input(s): "VITAMINB12", "FOLATE", "FERRITIN", "TIBC", "IRON", "RETICCTPCT" in the last 72 hours. Urine analysis:    Component Value Date/Time   COLORURINE YELLOW (A) 03/09/2022 2041   APPEARANCEUR CLOUDY (A) 03/09/2022 2041   APPEARANCEUR Cloudy (A) 01/31/2022 1302   LABSPEC 1.010 03/09/2022 2041   LABSPEC 1.013 01/04/2015 1239   PHURINE 6.0 03/09/2022 2041    GLUCOSEU NEGATIVE 03/09/2022 2041   GLUCOSEU Negative 01/04/2015 [place] NEGATIVE 03/09/2022 2041   [place] NEGATIVE 03/09/2022 2041   BILIRUBINUR Negative 01/31/2022 1302   BILIRUBINUR Negative 01/04/2015 [place] 03/09/2022 2041   PROTEINUR >=300 (A) 03/09/2022  2041   NITRITE NEGATIVE 03/09/2022 2041   LEUKOCYTESUR LARGE (A) 03/09/2022 2041   LEUKOCYTESUR 2+ 01/04/2015 1239    Radiological Exams on Admission: CT Renal Stone Study  Result Date: 03/09/2022 CLINICAL DATA:  Flank pain, kidney stone suspected EXAM: CT ABDOMEN AND PELVIS WITHOUT CONTRAST TECHNIQUE: Multidetector CT imaging of the abdomen and pelvis was performed following the standard protocol without IV contrast. RADIATION DOSE REDUCTION: This exam was performed according to the departmental dose-optimization program which includes automated exposure control, adjustment of the mA and/or kV according to patient size and/or use of iterative reconstruction technique. COMPARISON:  CT abdomen pelvis 02/13/2021 FINDINGS: Lower chest: No acute abnormality. Hepatobiliary: No focal liver abnormality. No gallstones, gallbladder wall thickening, or pericholecystic fluid. No biliary dilatation. Pancreas: No focal lesion. Normal pancreatic contour. No surrounding inflammatory changes. No main pancreatic ductal dilatation. Spleen: Normal in size without focal abnormality. Adrenals/Urinary Tract: No adrenal nodule bilaterally. Nonspecific right perinephric stranding. Right ureteral stent in grossly appropriate position with proximal tip overlying the renal pelvis and distal tip within the urinary bladder lumen adjacent to the Foley catheter balloon. No nephrolithiasis and no hydronephrosis. No definite contour-deforming renal mass. No ureterolithiasis or hydroureter. The urinary bladder is decompressed with a urinary Foley catheter tip and balloon terminating within its lumen. Stomach/Bowel: Sigmoid colon surgical changes.  Stomach is within normal limits. No evidence of bowel wall thickening or dilatation. The appendix is not definitely identified with no inflammatory changes in the right lower quadrant to suggest acute appendicitis. Vascular/Lymphatic: No abdominal aorta or iliac aneurysm. Severe atherosclerotic plaque of the aorta and its branches. No abdominal, pelvic, or inguinal lymphadenopathy. Reproductive: Status post hysterectomy. No adnexal masses. Other: No intraperitoneal free fluid. No intraperitoneal free gas. No organized fluid collection. Musculoskeletal: No abdominal wall hernia or abnormality. No suspicious lytic or blastic osseous lesions. No acute displaced fracture. Multilevel severe degenerative changes of the spine. T10-S1 thoracolumbar posterolateral fusion surgical hardware. Stable mild retrolisthesis of L3 on and on L5. Posterior disc osteophyte complex formation at the L2-L3, L3-L4, L4-L5, L5-S1 levels. Multilevel osseous neural foraminal and central canal stenosis. IMPRESSION: 1. Right ureteral stent in grossly appropriate position. Nonspecific asymmetric right perinephric stranding with markedly limited evaluation on this noncontrast study. Recommend correlation with urinalysis for superimposed infection. 2. Multilevel severe degenerative changes of the spine with associated T10 through S1 posterolateral fusion surgical hardware. Multilevel osseous neural foraminal and central canal stenosis. 3.  Aortic Atherosclerosis (ICD10-I70.0). Electronically Signed   By: Iven Finn M.D.   On: 03/09/2022 22:05   DG Chest 2 View  Result Date: 03/09/2022 CLINICAL DATA:  cough, concern for pna EXAM: CHEST - 2 VIEW COMPARISON:  Chest x-ray 02/16/2021 FINDINGS: The heart and mediastinal contours are unchanged. Aortic calcification. No focal consolidation. No pulmonary edema. No pleural effusion. No pneumothorax. No acute osseous abnormality. Thoracolumbar posterolateral surgical hardware partially visualized.  IMPRESSION: 1. No active cardiopulmonary disease. 2.  Aortic Atherosclerosis (ICD10-I70.0). Electronically Signed   By: Iven Finn M.D.   On: 03/09/2022 21:15     Data Reviewed: Relevant notes from primary care and specialist visits, past discharge summaries as available in EHR, including Care Everywhere. Prior diagnostic testing as pertinent to current admission diagnoses Updated medications and problem lists for reconciliation ED course, including vitals, labs, imaging, treatment and response to treatment Triage notes, nursing and pharmacy notes and ED provider's notes Notable results as noted in HPI   Assessment and Plan: * Sepsis secondary to acute pyelonephritis(HCC) Sepsis criteria includes  fever, tachycardia, leukocytosis with lactic acidosis Abnormal UA, recent E. coli UTI, recent stent exchange and Foley catheter exchange Cefepime Sepsis fluids Exchange Foley  Urology consulted Will keep n.p.o. after midnight in case of procedure.  SCD for DVT prophylaxis  Hydronephrosis of right kidney Right ureteral stent s/p exchange on 02/06/2022 Indwelling Foley catheter s/p exchange on 03/06/2022 Managed by urology.  Stent exchange on 5/9 Urology consulted for possible stent exchange in the setting of acute pyelonephritis   Normocytic anemia At baseline. Continue to monitor  Stage 3b chronic kidney disease ([place]) Renal function at baseline  Paroxysmal A-fib (HCC) Continue metoprolol and diltiazem.  Not on anticoagulation  Essential hypertension Slightly elevated.  Continue diltiazem and metoprolol  Rheumatoid arthritis involving multiple sites with positive rheumatoid factor (HCC) On immunosuppressive medication, leflunomide so at risk for severe infection Will hold leflunomide until showing improvement  DM type 2 with diabetic mixed hyperlipidemia (HCC) Sliding scale insulin coverage Holding Glucotrol and metformin Continue Crestor    DVT prophylaxis:  SCD  Consults: Urology Dr. Bernardo Heater  Advance Care Planning:   Code Status: Prior   Family Communication: none  Disposition Plan: Back to previous home environment  Severity of Illness: The appropriate patient status for this patient is INPATIENT. Inpatient status is judged to be reasonable and necessary in order to provide the required intensity of service to ensure the patient's safety. The patient's presenting symptoms, physical exam findings, and initial radiographic and laboratory data in the context of their chronic comorbidities is felt to place them at high risk for further clinical deterioration. Furthermore, it is not anticipated that the patient will be medically stable for discharge from the hospital within 2 midnights of admission.   * I certify that at the point of admission it is my clinical judgment that the patient will require inpatient hospital care spanning beyond 2 midnights from the point of admission due to high intensity of service, high risk for further deterioration and high frequency of surveillance required.*  Author: Athena Masse, MD 03/09/2022 10:54 PM  For on call review [email or web address].

## 2022-03-09 NOTE — ED Provider Notes (Signed)
[facility] Provider Note    Event Date/Time   First MD Initiated Contact with Patient 03/09/22 2039     (approximate)   History   Fever, Shortness of Breath, and Nausea   HPI  Stephanie [name] is a 81 y.o. female with CKD type 2 diabetes presents to the ER for evaluation of generalized malaise fever shortness of breath nausea vomiting.  Denies any abdominal pain.  No rash.  Has not been on any recent antibiotics.  Patient states that she took a Tylenol this evening she was feeling cold chills.  Feels improved now.  Patient did have Foley catheter removal just few days ago and urology for retention.  Was on Bactrim.  Review of Ronalee Belts results was E. coli bacteria sensitive to Rocephin.     Physical Exam   Triage Vital Signs: ED Triage Vitals  Enc Vitals Group     BP 03/09/22 2040 (!) 175/79     Pulse Rate 03/09/22 2040 100     Resp 03/09/22 2040 (!) 24     Temp 03/09/22 2040 (!) 102.4 F (39.1 C)     Temp Source 03/09/22 2040 Oral     SpO2 03/09/22 2037 99 %     Weight --      Height --      Head Circumference --      Peak Flow --      Pain Score 03/09/22 2038 0     Pain Loc --      Pain Edu? --      Excl. in [place]? --     Most recent vital signs: Vitals:   03/09/22 2040 03/09/22 2059  BP: (!) 175/79 (!) 152/55  Pulse: 100 81  Resp: (!) 24 19  Temp: (!) 102.4 F (39.1 C) (!) 100.6 F (38.1 C)  SpO2: 98% 97%     Constitutional: Alert  Eyes: Conjunctivae are normal.  Head: Atraumatic. Nose: No congestion/rhinnorhea. Mouth/Throat: Mucous membranes are moist.   Neck: Painless ROM.  Cardiovascular:   Good peripheral circulation. No m/g/r Respiratory: Normal respiratory effort.  No retractions. No crackles or rales Gastrointestinal: Soft and nontender.  Musculoskeletal:  no deformity Neurologic:  MAE spontaneously. No gross focal neurologic deficits are appreciated.  Skin:  Skin is warm, dry and intact. No rash noted. Psychiatric: Mood and  affect are normal. Speech and behavior are normal.    ED Results / Procedures / Treatments   Labs (all labs ordered are listed, but only abnormal results are displayed) Labs Reviewed  COMPREHENSIVE METABOLIC PANEL - Abnormal; Notable for the following components:      Result Value   CO2 21 (*)    Glucose, Bld 184 (*)    BUN 29 (*)    Creatinine, Ser 1.64 (*)    Calcium 8.7 (*)    Total Protein 6.2 (*)    Albumin 3.3 (*)    GFR, Estimated 32 (*)    All other components within normal limits  CBC - Abnormal; Notable for the following components:   WBC 15.6 (*)    RBC 3.58 (*)    Hemoglobin 9.8 (*)    HCT 32.2 (*)    RDW 15.9 (*)    All other components within normal limits  URINALYSIS, ROUTINE W REFLEX MICROSCOPIC - Abnormal; Notable for the following components:   Color, Urine YELLOW (*)    APPearance CLOUDY (*)    Protein, ur >=300 (*)    Leukocytes,Ua LARGE (*)  WBC, UA >50 (*)    Bacteria, UA MANY (*)    All other components within normal limits  LACTIC ACID, PLASMA - Abnormal; Notable for the following components:   Lactic Acid, Venous 2.2 (*)    All other components within normal limits  RESP PANEL BY RT-PCR (FLU A&B, COVID) [id number]  CULTURE, BLOOD (ROUTINE X 2)  CULTURE, BLOOD (ROUTINE X 2)  LIPASE, BLOOD  LACTIC ACID, PLASMA     EKG  ED ECG REPORT I, Merlyn Lot, the attending physician, personally viewed and interpreted this ECG.   Date: 03/09/2022  EKG Time: 20:39  Rate: fever  Rhythm: sinus  Axis: left  Intervals:iRBBB  ST&T Change: no stemi, no depressions    RADIOLOGY Please see ED Course for my review and interpretation.  I personally reviewed all radiographic images ordered to evaluate for the above acute complaints and reviewed radiology reports and findings.  These findings were personally discussed with the patient.  Please see medical record for radiology report.    PROCEDURES:  Critical Care performed: Yes, see critical care  procedure note(s)  Procedures   MEDICATIONS ORDERED IN ED: Medications  lactated ringers infusion ( Intravenous New Bag/Given 03/09/22 2211)  lactated ringers bolus 1,000 mL (has no administration in time range)  ceFEPIme (MAXIPIME) 2 g in sodium chloride 0.9 % 100 mL IVPB (0 g Intravenous Stopped 03/09/22 2212)     IMPRESSION / MDM / ASSESSMENT AND PLAN / ED COURSE  I reviewed the triage vital signs and the nursing notes.                              Differential diagnosis includes, but is not limited to, sepsis, pneumonia, pyelonephritis, UTI, dehydration, viral illness  Patient presented to the ER with fever tachycardia shortness of breath symptoms as described above.  Symptoms occurred some of this evening.  Patient's presentation complicated by her comorbidities and recent urologic procedures.  This presenting complaint could reflect a potentially life-threatening illness therefore the patient will be placed on continuous pulse oximetry and telemetry for monitoring.  Laboratory evaluation will be sent to evaluate for the above complaints.     Clinical Course as of 03/09/22 2235  Fri Mar 09, 2022  2133 Chest x-ray by my interpretation without evidence of pneumonia.  Urinalysis is consistent with acute infection.  Lactate elevated leukocytosis.  Given history of complex urological history will order CT imaging of the abdomen she is now complaining of some right flank pain on exam. [PR]  2212 CT imaging with appropriately positioned ureteral stent but with evidence of perinephric stranding concerning for pyelonephritis.  Given her sepsis and concern for complicated UTI will consult hospitalist for admission. [PR]    Clinical Course User Index [PR] Merlyn Lot, MD    Patient's presentation is most consistent with acute presentation with potential threat to life or bodily function.   FINAL CLINICAL IMPRESSION(S) / ED DIAGNOSES   Final diagnoses:  Sepsis with acute organ  dysfunction without septic shock, due to unspecified organism, unspecified type ([place])     Rx / DC Orders   ED Discharge Orders     None        Note:  This document was prepared using Dragon voice recognition software and may include unintentional dictation errors.    Merlyn Lot, MD 03/09/22 2235

## 2022-03-09 NOTE — Assessment & Plan Note (Addendum)
   Sliding scale insulin coverage  Holding Glucotrol and metformin  Continue Crestor

## 2022-03-09 NOTE — Sepsis Progress Note (Addendum)
Notified provider of need to order blood cultures.  MD will be placing order now  Antibiotics have already been given

## 2022-03-09 NOTE — Assessment & Plan Note (Addendum)
Sepsis criteria includes fever, tachycardia, leukocytosis with lactic acidosis Abnormal UA, recent E. coli UTI, recent stent exchange and Foley catheter exchange  Cefepime  Exchange Foley   Urology consulted - no procedures and no stent exchange needed, continue IV abx and follow cultures (see note 06/10) . 06/11: double checked and no UCx orders were initially placed, called lab and confirmed add-on culture to 06/09 specimen. (Note: UCx 05/03 +Ecoli [R] ampicillin, cipro, levoflox, tetracycline ,tmp/smx [S] cefepime, ceftiraxone, nitrofurantoin, pip/tazo, others - based on this culture po cefdinir, cephalexin should be adequate)

## 2022-03-09 NOTE — Assessment & Plan Note (Signed)
At baseline. Continue to monitor

## 2022-03-09 NOTE — Assessment & Plan Note (Addendum)
On immunosuppressive medication, leflunomide so at risk for severe infection  Will hold leflunomide until showing improvement

## 2022-03-09 NOTE — Assessment & Plan Note (Signed)
   Continue metoprolol and diltiazem.    Not on anticoagulation

## 2022-03-09 NOTE — Assessment & Plan Note (Addendum)
Right ureteral stent s/p exchange on 02/06/2022 Indwelling Foley catheter s/p exchange on 03/06/2022, 03/09/2022  Urology consulted see note 06/10 and see A/P sepsis - no procedures at this time

## 2022-03-09 NOTE — ED Triage Notes (Signed)
Pt spiked a fever at home tonight and was not feeling well. Pt took a gram of tylenol at home. Reports shortness of breath and nausea

## 2022-03-09 NOTE — Consult Note (Signed)
CODE SEPSIS - PHARMACY COMMUNICATION  **Broad Spectrum Antibiotics should be administered within 1 hour of Sepsis diagnosis**  Time Code Sepsis Called/Page Received: 2127  Antibiotics Ordered: cefepime  Time of 1st antibiotic administration: 2145  Additional action taken by pharmacy: none  If necessary, Name of Provider/Nurse Contacted: n/a   Colton Tassin Rodriguez-Guzman PharmD, BCPS 03/09/2022 9:30 PM

## 2022-03-10 ENCOUNTER — Other Ambulatory Visit: Payer: Self-pay

## 2022-03-10 ENCOUNTER — Encounter: Payer: Self-pay | Admitting: Internal Medicine

## 2022-03-10 DIAGNOSIS — A419 Sepsis, unspecified organism: Secondary | ICD-10-CM | POA: Diagnosis not present

## 2022-03-10 DIAGNOSIS — N390 Urinary tract infection, site not specified: Secondary | ICD-10-CM | POA: Diagnosis not present

## 2022-03-10 LAB — CBG MONITORING, ED
Glucose-Capillary: 170 mg/dL — ABNORMAL HIGH (ref 70–99)
Glucose-Capillary: 153 mg/dL — ABNORMAL HIGH (ref 70–99)
Glucose-Capillary: 143 mg/dL — ABNORMAL HIGH (ref 70–99)
Glucose-Capillary: 127 mg/dL — ABNORMAL HIGH (ref 70–99)

## 2022-03-10 LAB — CBC
HCT: 29.8 % — ABNORMAL LOW (ref 36.0–46.0)
Hemoglobin: 9.2 g/dL — ABNORMAL LOW (ref 12.0–15.0)
MCH: 27.5 pg (ref 26.0–34.0)
MCHC: 30.9 g/dL (ref 30.0–36.0)
MCV: 89.2 fL (ref 80.0–100.0)
Platelets: 204 10*3/uL (ref 150–400)
RBC: 3.34 MIL/uL — ABNORMAL LOW (ref 3.87–5.11)
RDW: 16 % — ABNORMAL HIGH (ref 11.5–15.5)
WBC: 17.7 10*3/uL — ABNORMAL HIGH (ref 4.0–10.5)
nRBC: 0 % (ref 0.0–0.2)

## 2022-03-10 LAB — BASIC METABOLIC PANEL
Anion gap: 5 (ref 5–15)
BUN: 26 mg/dL — ABNORMAL HIGH (ref 8–23)
CO2: 22 mmol/L (ref 22–32)
Calcium: 8.8 mg/dL — ABNORMAL LOW (ref 8.9–10.3)
Chloride: 111 mmol/L (ref 98–111)
Creatinine, Ser: 1.47 mg/dL — ABNORMAL HIGH (ref 0.44–1.00)
GFR, Estimated: 36 mL/min — ABNORMAL LOW (ref >60)
Glucose, Bld: 142 mg/dL — ABNORMAL HIGH (ref 70–99)
Potassium: 4 mmol/L (ref 3.5–5.1)
Sodium: 138 mmol/L (ref 135–145)

## 2022-03-10 LAB — HEMOGLOBIN A1C
Hgb A1c MFr Bld: 6.8 % — ABNORMAL HIGH (ref 4.8–5.6)
Mean Plasma Glucose: 148.46 mg/dL

## 2022-03-10 LAB — LACTIC ACID, PLASMA: Lactic Acid, Venous: 1.7 mmol/L (ref 0.5–1.9)

## 2022-03-10 LAB — GLUCOSE, CAPILLARY
Glucose-Capillary: 112 mg/dL — ABNORMAL HIGH (ref 70–99)
Glucose-Capillary: 151 mg/dL — ABNORMAL HIGH (ref 70–99)

## 2022-03-10 MED ORDER — HYDRALAZINE HCL 20 MG/ML IJ SOLN
10.0000 mg | Freq: Once | INTRAMUSCULAR | Status: AC | PRN
  Administered 2022-03-10: 10 mg via INTRAVENOUS
  Filled 2022-03-10: qty 1

## 2022-03-10 MED ORDER — CEFEPIME HCL 2 G IV SOLR
2.0000 g | INTRAVENOUS | Status: DC
Start: 2022-03-10 — End: 2022-03-13
  Administered 2022-03-10 – 2022-03-12 (×3): 2 g via INTRAVENOUS
  Filled 2022-03-10: qty 12.5
  Filled 2022-03-10 (×2): qty 2

## 2022-03-10 MED ORDER — HYDRALAZINE HCL 20 MG/ML IJ SOLN
10.0000 mg | Freq: Four times a day (QID) | INTRAMUSCULAR | Status: DC | PRN
Start: 2022-03-10 — End: 2022-03-13
  Administered 2022-03-10 – 2022-03-12 (×3): 10 mg via INTRAVENOUS
  Filled 2022-03-10 (×3): qty 1

## 2022-03-10 MED ORDER — BRIMONIDINE TARTRATE 0.2 % OP SOLN
1.0000 [drp] | Freq: Two times a day (BID) | OPHTHALMIC | Status: DC
Start: 2022-03-10 — End: 2022-03-13
  Administered 2022-03-10 – 2022-03-13 (×6): 1 [drp] via OPHTHALMIC
  Filled 2022-03-10: qty 5

## 2022-03-10 NOTE — Assessment & Plan Note (Addendum)
   Urology consulted see note 06/10 and see A/P sepsis

## 2022-03-10 NOTE — Consult Note (Signed)
Urology Consult  Requesting physician: Judd Gaudier, MD  Reason for consultation: ? Sepsis from a UTI, still present  Chief Complaint: UTI  History of Present Illness: Stephanie [name] is a 81 y.o. 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  Her stent was last exchanged on 02/06/2022.  She also has chronic urinary retention managed with an indwelling Foley catheter and history recurrent UTI.  Preoperative urine culture on 01/31/2022 grew E. coli and she was treated prior to stent placement and covered with preoperative IV antibiotics.  No postop problems after stent placement.  Presented to ED last night with complaints of fever, chills, shortness of breath and nausea.  On arrival to ED temp was 102.4.  Lab work remarkable for leukocytosis at 15.6, lactic acid 2.2.  UA with >50 WBC and bacteriuria  CT renal stone study showed no hydronephrosis and stent in good position.  Perinephric stranding incidentally noted.  Admitted to the hospitalist service and started on IV antibiotics and fluids  Past Medical History:  Diagnosis Date   Anemia    Arthritis    Asthma    Cancer ([place]) 1996   colon   Chronic kidney disease    Complication of anesthesia    nausea   Diabetes mellitus without complication ([place])    Dysrhythmia    Foley catheter in place    GERD (gastroesophageal reflux disease)    Glaucoma    Hyperlipidemia    Hypertension    Lumbar disc disease    Pneumonia    PONV (postoperative nausea and vomiting)    Small bowel obstruction ([facility])    Urinary retention    Uses walker    Vitamin B 12 deficiency    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    has, does not wear    Past Surgical History:  Procedure Laterality Date   ABDOMINAL HYSTERECTOMY     BACK SURGERY     x5  "screws and rods"   BREAST BIOPSY Right 11/01/2021   rt br biopsy calcs ribbon clip path pending 2nd site LIQ   BREAST BIOPSY Right 11/01/2021   rt br stereo calcs coil clip  1st site path pending UIQ   COLON SURGERY     COLONOSCOPY WITH PROPOFOL N/A 05/22/2017   Procedure: COLONOSCOPY WITH PROPOFOL;  Surgeon: Manya Silvas, MD;  Location: [facility] ENDOSCOPY;  Service: Endoscopy;  Laterality: N/A;   COLONOSCOPY WITH PROPOFOL N/A 06/08/2021   Procedure: COLONOSCOPY WITH PROPOFOL;  Surgeon: Lucilla Lame, MD;  Location: [place];  Service: Endoscopy;  Laterality: N/A;  Diabetic   CYSTOSCOPY W/ URETERAL STENT PLACEMENT  07/25/2021   Procedure: CYSTOSCOPY WITH RETROGRADE PYELOGRAM/URETERAL STENT PLACEMENT;  Surgeon: Abbie Sons, MD;  Location: [facility] ORS;  Service: Urology;;   CYSTOSCOPY W/ URETERAL STENT PLACEMENT  02/06/2022   Procedure: CYSTOSCOPY WITH RETROGRADE PYELOGRAM/URETERAL STENT EXCHANGE;  Surgeon: Abbie Sons, MD;  Location: [facility] ORS;  Service: Urology;;   CYSTOSCOPY WITH STENT PLACEMENT Right 08/02/2020   Procedure: CYSTOSCOPY WITH STENT EXCHANGE;  Surgeon: Abbie Sons, MD;  Location: [facility] ORS;  Service: Urology;  Laterality: Right;   CYSTOSCOPY WITH STENT PLACEMENT Right 06/18/2020   Procedure: CYSTOSCOPY WITH STENT PLACEMENT;  Surgeon: Irine Seal, MD;  Location: [facility] ORS;  Service: Urology;  Laterality: Right;   EYE SURGERY Bilateral    Cataracts   FRACTURE SURGERY Right 2011   foot surgery   JOINT REPLACEMENT     KNEE ARTHROSCOPY Bilateral  LAPAROTOMY N/A 06/30/2020   Procedure: EXPLORATORY LAPAROTOMY;  Surgeon: Jules Husbands, MD;  Location: [facility] ORS;  Service: General;  Laterality: N/A;   LUMBAR LAMINECTOMY     POLYPECTOMY  06/08/2021   Procedure: POLYPECTOMY;  Surgeon: Lucilla Lame, MD;  Location: [place];  Service: Endoscopy;;   TEMPORARY DIALYSIS CATHETER N/A 06/27/2020   Procedure: TEMPORARY DIALYSIS CATHETER;  Surgeon: Katha Cabal, MD;  Location: [place] [facility];  Service: Cardiovascular;  Laterality: N/A;    Home Medications:  Current Meds  Medication Sig   acetaminophen (TYLENOL) 325 MG  tablet Take 2 tablets (650 mg total) by mouth every 6 (six) hours as needed for moderate pain, fever or headache.   ascorbic acid (VITAMIN C) 500 MG tablet Take 1 tablet (500 mg total) by mouth daily.   brimonidine (ALPHAGAN) 0.2 % ophthalmic solution Place 1 drop into both eyes 2 (two) times daily.   Cranberry 400 MG CAPS Take 400 mg by mouth daily.    diltiazem (CARDIZEM CD) 120 MG 24 hr capsule Take 120 mg by mouth in the morning and at bedtime.   glipiZIDE (GLUCOTROL) 10 MG tablet Take 5 mg by mouth 2 (two) times daily before a meal.   Iron-Vitamin C (VITRON-C) 65-125 MG TABS Take 1 tablet by mouth daily.   Magnesium Oxide 250 MG TABS Take 250 mg by mouth daily.   metFORMIN (GLUCOPHAGE-XR) 500 MG 24 hr tablet Take 500 mg by mouth every evening.   metoprolol tartrate (LOPRESSOR) 50 MG tablet Take 50 mg by mouth 2 (two) times daily.   omeprazole (PRILOSEC) 40 MG capsule Take 40 mg by mouth daily.   Probiotic Product (PROBIOTIC-10 PO) Take 1 capsule by mouth daily.   rosuvastatin (CRESTOR) 10 MG tablet Take 10 mg by mouth daily.    vitamin B-12 (CYANOCOBALAMIN) 1000 MCG tablet Take 1,000 mcg by mouth daily.    Allergies:  Allergies  Allergen Reactions   Infliximab Hives, Shortness Of Breath and Itching   Ibuprofen Itching   Indomethacin Other (See Comments)    headache   Methotrexate Derivatives Other (See Comments)    Headache   Moexipril Other (See Comments)    unknown   Percocet [Oxycodone-Acetaminophen] Itching   Naprosyn [Naproxen] Rash    Family History  Problem Relation Age of Onset   Breast cancer Neg Hx     Social History:  reports that she has never smoked. She has never used smokeless tobacco. She reports that she does not drink alcohol and does not use drugs.  ROS: None contributory except as per the HPI  Physical Exam:  Vital signs in last 24 hours: Temp:  [98.4 F (36.9 C)-102.4 F (39.1 C)] 98.9 F (37.2 C) (06/10 1500) Pulse Rate:  [53-100] 63 (06/10  1500) Resp:  [17-24] 17 (06/10 1300) BP: (151-184)/(55-79) 183/73 (06/10 1500) SpO2:  [96 %-99 %] 98 % (06/10 1500) Weight:  [72.3 kg] 72.3 kg (06/09 2342) Constitutional:  Alert and oriented, No acute distress HEENT: [place] AT Respiratory: Normal respiratory effort Psychiatric: Normal mood and affect   Laboratory Data:  Recent Labs    03/09/22 2041 03/10/22 1216  WBC 15.6* 17.7*  HGB 9.8* 9.2*  HCT 32.2* 29.8*   Recent Labs    03/09/22 2041 03/10/22 1216  NA 138 138  K 4.2 4.0  CL 110 111  CO2 21* 22  GLUCOSE 184* 142*  BUN 29* 26*  CREATININE 1.64* 1.47*  CALCIUM 8.7* 8.8*   No results for  input(s): "LABPT", "INR" in the last 72 hours. No results for input(s): "LABURIN" in the last 72 hours. Results for orders placed or performed during the hospital encounter of 03/09/22  Resp Panel by RT-PCR (Flu A&B, Covid) Anterior Nasal Swab     Status: None   Collection Time: 03/09/22  8:33 PM   Specimen: Anterior Nasal Swab  Result Value Ref Range Status   SARS Coronavirus 2 by RT PCR NEGATIVE NEGATIVE Final    Comment: (NOTE) SARS-CoV-2 target nucleic acids are NOT DETECTED.  The SARS-CoV-2 RNA is generally detectable in upper respiratory specimens during the acute phase of infection. The lowest concentration of SARS-CoV-2 viral copies this assay can detect is 138 copies/mL. A negative result does not preclude SARS-Cov-2 infection and should not be used as the sole basis for treatment or other patient management decisions. A negative result may occur with  improper specimen collection/handling, submission of specimen other than nasopharyngeal swab, presence of viral mutation(s) within the areas targeted by this assay, and inadequate number of viral copies(<138 copies/mL). A negative result must be combined with clinical observations, patient history, and epidemiological information. The expected result is Negative.  Fact Sheet for Patients:   EntrepreneurPulse.com.au  Fact Sheet for Healthcare Providers:  IncredibleEmployment.be  This test is no t yet approved or cleared by the [place] FDA and  has been authorized for detection and/or diagnosis of SARS-CoV-2 by FDA under an Emergency Use Authorization (EUA). This EUA will remain  in effect (meaning this test can be used) for the duration of the COVID-19 declaration under Section 564(b)(1) of the Act, 21 U.S.C.section 360bbb-3(b)(1), unless the authorization is terminated  or revoked sooner.       Influenza A by PCR NEGATIVE NEGATIVE Final   Influenza B by PCR NEGATIVE NEGATIVE Final    Comment: (NOTE) The Xpert Xpress SARS-CoV-2/FLU/RSV plus assay is intended as an aid in the diagnosis of influenza from Nasopharyngeal swab specimens and should not be used as a sole basis for treatment. Nasal washings and aspirates are unacceptable for Xpert Xpress SARS-CoV-2/FLU/RSV testing.  Fact Sheet for Patients: EntrepreneurPulse.com.au  Fact Sheet for Healthcare Providers: IncredibleEmployment.be  This test is not yet approved or cleared by the [place] FDA and has been authorized for detection and/or diagnosis of SARS-CoV-2 by FDA under an Emergency Use Authorization (EUA). This EUA will remain in effect (meaning this test can be used) for the duration of the COVID-19 declaration under Section 564(b)(1) of the Act, 21 U.S.C. section 360bbb-3(b)(1), unless the authorization is terminated or revoked.  Performed at [facility], [address] [place], [place] [address]   Blood culture (routine x 2)     Status: None (Preliminary result)   Collection Time: 03/09/22  8:33 PM   Specimen: BLOOD  Result Value Ref Range Status   Specimen Description BLOOD RIGHT ANTECUBITAL  Final   Special Requests   Final    BOTTLES DRAWN AEROBIC AND ANAEROBIC Blood Culture adequate volume   Culture    Final    NO GROWTH < 24 HOURS Performed at [facility], [address], [place], [place] [address]    Report Status PENDING  Incomplete  Blood culture (routine x 2)     Status: None (Preliminary result)   Collection Time: 03/09/22  8:33 PM   Specimen: BLOOD  Result Value Ref Range Status   Specimen Description BLOOD RIGHT ANTECUBITAL  Final   Special Requests   Final    BOTTLES DRAWN AEROBIC  AND ANAEROBIC Blood Culture results may not be optimal due to an inadequate volume of blood received in culture bottles   Culture   Final    NO GROWTH < 24 HOURS Performed at [facility], [address] [place], [place] [address]    Report Status PENDING  Incomplete     Radiologic Imaging: CT images were personally reviewed and interpreted  CT Renal Stone Study  Result Date: 03/09/2022 CLINICAL DATA:  Flank pain, kidney stone suspected EXAM: CT ABDOMEN AND PELVIS WITHOUT CONTRAST TECHNIQUE: Multidetector CT imaging of the abdomen and pelvis was performed following the standard protocol without IV contrast. RADIATION DOSE REDUCTION: This exam was performed according to the departmental dose-optimization program which includes automated exposure control, adjustment of the mA and/or kV according to patient size and/or use of iterative reconstruction technique. COMPARISON:  CT abdomen pelvis 02/13/2021 FINDINGS: Lower chest: No acute abnormality. Hepatobiliary: No focal liver abnormality. No gallstones, gallbladder wall thickening, or pericholecystic fluid. No biliary dilatation. Pancreas: No focal lesion. Normal pancreatic contour. No surrounding inflammatory changes. No main pancreatic ductal dilatation. Spleen: Normal in size without focal abnormality. Adrenals/Urinary Tract: No adrenal nodule bilaterally. Nonspecific right perinephric stranding. Right ureteral stent in grossly appropriate position with proximal tip overlying the renal pelvis and distal tip within the urinary  bladder lumen adjacent to the Foley catheter balloon. No nephrolithiasis and no hydronephrosis. No definite contour-deforming renal mass. No ureterolithiasis or hydroureter. The urinary bladder is decompressed with a urinary Foley catheter tip and balloon terminating within its lumen. Stomach/Bowel: Sigmoid colon surgical changes. Stomach is within normal limits. No evidence of bowel wall thickening or dilatation. The appendix is not definitely identified with no inflammatory changes in the right lower quadrant to suggest acute appendicitis. Vascular/Lymphatic: No abdominal aorta or iliac aneurysm. Severe atherosclerotic plaque of the aorta and its branches. No abdominal, pelvic, or inguinal lymphadenopathy. Reproductive: Status post hysterectomy. No adnexal masses. Other: No intraperitoneal free fluid. No intraperitoneal free gas. No organized fluid collection. Musculoskeletal: No abdominal wall hernia or abnormality. No suspicious lytic or blastic osseous lesions. No acute displaced fracture. Multilevel severe degenerative changes of the spine. T10-S1 thoracolumbar posterolateral fusion surgical hardware. Stable mild retrolisthesis of L3 on and on L5. Posterior disc osteophyte complex formation at the L2-L3, L3-L4, L4-L5, L5-S1 levels. Multilevel osseous neural foraminal and central canal stenosis. IMPRESSION: 1. Right ureteral stent in grossly appropriate position. Nonspecific asymmetric right perinephric stranding with markedly limited evaluation on this noncontrast study. Recommend correlation with urinalysis for superimposed infection. 2. Multilevel severe degenerative changes of the spine with associated T10 through S1 posterolateral fusion surgical hardware. Multilevel osseous neural foraminal and central canal stenosis. 3.  Aortic Atherosclerosis (ICD10-I70.0). Electronically Signed   By: Iven Finn M.D.   On: 03/09/2022 22:05   DG Chest 2 View  Result Date: 03/09/2022 CLINICAL DATA:  cough, concern  for pna EXAM: CHEST - 2 VIEW COMPARISON:  Chest x-ray 02/16/2021 FINDINGS: The heart and mediastinal contours are unchanged. Aortic calcification. No focal consolidation. No pulmonary edema. No pleural effusion. No pneumothorax. No acute osseous abnormality. Thoracolumbar posterolateral surgical hardware partially visualized. IMPRESSION: 1. No active cardiopulmonary disease. 2.  Aortic Atherosclerosis (ICD10-I70.0). Electronically Signed   By: Iven Finn M.D.   On: 03/09/2022 21:15    Impression/Assessment:  Febrile UTI most likely pyelonephritis  Recommendation:  Ureteral stent was exchanged 1 month ago.  Stent in good position and no hydronephrosis.  No indication for stent exchange at this time Continue IV antibiotic  coverage pending culture results   03/10/2022, 3:27 PM  John Giovanni,  MD

## 2022-03-10 NOTE — ED Notes (Signed)
[facility] [phone]

## 2022-03-10 NOTE — Progress Notes (Signed)
PROGRESS NOTE    Stephanie [name]  HAL:[id number] DOB: [birth year]  DOA: 03/09/2022 Date of Service: 03/10/22 PCP: Rusty Aus, MD  Brief Narrative / Hospital Course:  Stephanie [name] is a 81 y.o. female with medical history significant for Rheumatoid arthritis on chronic immunosuppressive therapy, DM, HTN, remote history of colon cancer and bladder cancer, CKD 3B,A-fib not on anticoagulation, right hydronephrosis with ureteral stent most recently exchanged on 5/9 and with chronic indwelling Foley exchanged on 6/6, and history of SBO who presents to the ED 03/09/22 with fever, chills and generalized malaise,associated nausea without vomiting and mild shortness of breath. Had a E. coli UTI a couple of weeks ago treated with Bactrim. 06/09: CT R ureteral stent, R perinephric stranding. UA(+). HR 100, TMax 102.8F, RR 24, WBC 15+ = sepsis. --> IV fluids, Cefepime, urology consulted 06/10: afebrile, HR and RR improved, husband reports mentation is much better. Lactic acid trending down.    Consultants:  Urology - admitting physician placed consult to Dr Bernardo Heater 06/09  Procedures: None     Subjective: Patient reports feeling better, husband reports she is improved mentally. She is hungry and wants to eat.      ASSESSMENT & PLAN:   Principal Problem:   Sepsis secondary to acute pyelonephritis(HCC) Active Problems:   Acute pyelonephritis   Hydronephrosis of right kidney   S/p exchange of ureteral stent 02/06/22   Indwelling Foley catheter present   DM type 2 with diabetic mixed hyperlipidemia (HCC)   Rheumatoid arthritis involving multiple sites with positive rheumatoid factor (HCC)   Essential hypertension   Paroxysmal A-fib (HCC)   Stage 3b chronic kidney disease (HCC)   Normocytic anemia   Rheumatoid arthritis involving multiple sites with positive rheumatoid factor (HCC) On immunosuppressive medication, leflunomide so at risk for severe infection Will hold leflunomide until  showing improvement  DM type 2 with diabetic mixed hyperlipidemia (HCC) Sliding scale insulin coverage Holding Glucotrol and metformin Continue Crestor  Essential hypertension Slightly elevated. Improved this AM Continue diltiazem and metoprolol  Paroxysmal A-fib (HCC) Continue metoprolol and diltiazem.   Not on anticoagulation  Stage 3b chronic kidney disease ([place]) Renal function at baseline on admission Follow BMP  Sepsis secondary to acute pyelonephritis(HCC) Sepsis criteria includes fever, tachycardia, leukocytosis with lactic acidosis Abnormal UA, recent E. coli UTI, recent stent exchange and Foley catheter exchange Cefepime Sepsis fluids Exchange Foley  Urology consulted - will confer w/ them today  Will keep n.p.o. after midnight in case of procedure.  SCD for DVT prophylaxis  Hydronephrosis of right kidney Right ureteral stent s/p exchange on 02/06/2022 Indwelling Foley catheter s/p exchange on 03/06/2022 Urology consulted for possible stent exchange in the setting of acute pyelonephritis   Normocytic anemia At baseline.  Continue to monitor  S/p exchange of ureteral stent 02/06/22 Urology consulted    DVT prophylaxis: SCD Code Status: Full code Family Communication: Husband at bedside in ED on rounds Disposition Plan / TOC needs: Remains inpatient for now, anticipate discharge back to home environment, may need to have PT eval prior to that Barriers to discharge / significant pending items: Awaiting urologic consultation, IV antibiotics until cultures result, monitoring labs/sepsis parameters which seem to be improving at this point             Objective: Vitals:   03/10/22 0130 03/10/22 0430 03/10/22 0830 03/10/22 0855  BP: (!) 166/67 (!) 184/72 (!) 152/65 (!) 152/65  Pulse: 65 77 78 80  Resp: 19 (!) 22 20 20  Temp:    98.6 F (37 C)  TempSrc:    Oral  SpO2: 97% 97% 98% 97%  Weight:        Intake/Output Summary (Last 24 hours) at 03/10/2022  1047 Last data filed at 03/10/2022 8280 Gross per 24 hour  Intake 1100 ml  Output 1000 ml  Net 100 ml   Filed Weights   03/09/22 2342  Weight: 72.3 kg    Examination:  Constitutional:  VS as above General Appearance: alert, well-developed, well-nourished, NAD Eyes: Normal lids and conjunctive, non-icteric sclera Ears, Nose, Mouth, Throat: Normal appearance Neck: No masses, trachea midlin Respiratory: Normal respiratory effort Breath sounds normal, no wheeze/rhonchi/rales Cardiovascular: S1/S2 normal, no murmur/rub/gallop auscultated No lower extremity edema Gastrointestinal: Nontender, no masses Musculoskeletal:  No clubbing/cyanosis of digits Neurological: No cranial nerve deficit on limited exam Psychiatric: Normal judgment/insight Normal mood and affect       Scheduled Medications:   diltiazem  120 mg Oral Daily   insulin aspart  0-15 Units Subcutaneous Q4H   metoprolol tartrate  50 mg Oral BID   pantoprazole  40 mg Oral Daily   rosuvastatin  10 mg Oral Daily    Continuous Infusions:  ceFEPime (MAXIPIME) IV     lactated ringers 150 mL/hr at 03/09/22 2211    PRN Medications:  acetaminophen **OR** acetaminophen, HYDROcodone-acetaminophen, ondansetron **OR** ondansetron (ZOFRAN) IV  Antimicrobials:  Anti-infectives (From admission, onward)    Start     Dose/Rate Route Frequency Ordered Stop   03/10/22 2200  ceFEPIme (MAXIPIME) 2 g in sodium chloride 0.9 % 100 mL IVPB        2 g 200 mL/hr over 30 Minutes Intravenous Every 24 hours 03/10/22 0022     03/09/22 2130  ceFEPIme (MAXIPIME) 2 g in sodium chloride 0.9 % 100 mL IVPB        2 g 200 mL/hr over 30 Minutes Intravenous  Once 03/09/22 2120 03/09/22 2212       Data Reviewed: I have personally reviewed following labs and imaging studies  CBC: Recent Labs  Lab 03/09/22 2041  WBC 15.6*  HGB 9.8*  HCT 32.2*  MCV 89.9  PLT 034   Basic Metabolic Panel: Recent Labs  Lab 03/09/22 2041  NA  138  K 4.2  CL 110  CO2 21*  GLUCOSE 184*  BUN 29*  CREATININE 1.64*  CALCIUM 8.7*   GFR: Estimated Creatinine Clearance: 28.3 mL/min (A) (by C-G formula based on SCr of 1.64 mg/dL (H)). Liver Function Tests: Recent Labs  Lab 03/09/22 2041  AST 22  ALT 15  ALKPHOS 97  BILITOT 0.8  PROT 6.2*  ALBUMIN 3.3*   Recent Labs  Lab 03/09/22 2041  LIPASE 28   No results for input(s): "AMMONIA" in the last 168 hours. Coagulation Profile: No results for input(s): "INR", "PROTIME" in the last 168 hours. Cardiac Enzymes: No results for input(s): "CKTOTAL", "CKMB", "CKMBINDEX", "TROPONINI" in the last 168 hours. BNP (last 3 results) No results for input(s): "PROBNP" in the last 8760 hours. HbA1C: Recent Labs    03/10/22 0135  HGBA1C 6.8*   CBG: Recent Labs  Lab 03/10/22 0134 03/10/22 0510 03/10/22 0714  GLUCAP 170* 153* 143*   Lipid Profile: No results for input(s): "CHOL", "HDL", "LDLCALC", "TRIG", "CHOLHDL", "LDLDIRECT" in the last 72 hours. Thyroid Function Tests: No results for input(s): "TSH", "T4TOTAL", "FREET4", "T3FREE", "THYROIDAB" in the last 72 hours. Anemia Panel: No results for input(s): "VITAMINB12", "FOLATE", "FERRITIN", "TIBC", "IRON", "RETICCTPCT" in the last 72  hours. Urine analysis:    Component Value Date/Time   COLORURINE YELLOW (A) 03/09/2022 2041   APPEARANCEUR CLOUDY (A) 03/09/2022 2041   APPEARANCEUR Cloudy (A) 01/31/2022 1302   LABSPEC 1.010 03/09/2022 2041   LABSPEC 1.013 01/04/2015 1239   PHURINE 6.0 03/09/2022 2041   GLUCOSEU NEGATIVE 03/09/2022 2041   GLUCOSEU Negative 01/04/2015 [place] NEGATIVE 03/09/2022 2041   BILIRUBINUR NEGATIVE 03/09/2022 2041   BILIRUBINUR Negative 01/31/2022 1302   BILIRUBINUR Negative 01/04/2015 1239   KETONESUR NEGATIVE 03/09/2022 2041   PROTEINUR >=300 (A) 03/09/2022 2041   NITRITE NEGATIVE 03/09/2022 2041   LEUKOCYTESUR LARGE (A) 03/09/2022 2041   LEUKOCYTESUR 2+ 01/04/2015 1239   Sepsis  Labs: '@LABRCNTIP'$ (procalcitonin:4,lacticidven:4)  Recent Results (from the past 240 hour(s))  Resp Panel by RT-PCR (Flu A&B, Covid) Anterior Nasal Swab     Status: None   Collection Time: 03/09/22  8:33 PM   Specimen: Anterior Nasal Swab  Result Value Ref Range Status   SARS Coronavirus 2 by RT PCR NEGATIVE NEGATIVE Final    Comment: (NOTE) SARS-CoV-2 target nucleic acids are NOT DETECTED.  The SARS-CoV-2 RNA is generally detectable in upper respiratory specimens during the acute phase of infection. The lowest concentration of SARS-CoV-2 viral copies this assay can detect is 138 copies/mL. A negative result does not preclude SARS-Cov-2 infection and should not be used as the sole basis for treatment or other patient management decisions. A negative result may occur with  improper specimen collection/handling, submission of specimen other than nasopharyngeal swab, presence of viral mutation(s) within the areas targeted by this assay, and inadequate number of viral copies(<138 copies/mL). A negative result must be combined with clinical observations, patient history, and epidemiological information. The expected result is Negative.  Fact Sheet for Patients:  EntrepreneurPulse.com.au  Fact Sheet for Healthcare Providers:  IncredibleEmployment.be  This test is no t yet approved or cleared by the [place] FDA and  has been authorized for detection and/or diagnosis of SARS-CoV-2 by FDA under an Emergency Use Authorization (EUA). This EUA will remain  in effect (meaning this test can be used) for the duration of the COVID-19 declaration under Section 564(b)(1) of the Act, 21 U.S.C.section 360bbb-3(b)(1), unless the authorization is terminated  or revoked sooner.       Influenza A by PCR NEGATIVE NEGATIVE Final   Influenza B by PCR NEGATIVE NEGATIVE Final    Comment: (NOTE) The Xpert Xpress SARS-CoV-2/FLU/RSV plus assay is intended as an  aid in the diagnosis of influenza from Nasopharyngeal swab specimens and should not be used as a sole basis for treatment. Nasal washings and aspirates are unacceptable for Xpert Xpress SARS-CoV-2/FLU/RSV testing.  Fact Sheet for Patients: EntrepreneurPulse.com.au  Fact Sheet for Healthcare Providers: IncredibleEmployment.be  This test is not yet approved or cleared by the [place] FDA and has been authorized for detection and/or diagnosis of SARS-CoV-2 by FDA under an Emergency Use Authorization (EUA). This EUA will remain in effect (meaning this test can be used) for the duration of the COVID-19 declaration under Section 564(b)(1) of the Act, 21 U.S.C. section 360bbb-3(b)(1), unless the authorization is terminated or revoked.  Performed at [facility], [address] [place], [place] [address]          Radiology Studies last 96 hours: CT Renal Stone Study  Result Date: 03/09/2022 CLINICAL DATA:  Flank pain, kidney stone suspected EXAM: CT ABDOMEN AND PELVIS WITHOUT CONTRAST TECHNIQUE: Multidetector CT imaging of the abdomen and pelvis was performed following the  standard protocol without IV contrast. RADIATION DOSE REDUCTION: This exam was performed according to the departmental dose-optimization program which includes automated exposure control, adjustment of the mA and/or kV according to patient size and/or use of iterative reconstruction technique. COMPARISON:  CT abdomen pelvis 02/13/2021 FINDINGS: Lower chest: No acute abnormality. Hepatobiliary: No focal liver abnormality. No gallstones, gallbladder wall thickening, or pericholecystic fluid. No biliary dilatation. Pancreas: No focal lesion. Normal pancreatic contour. No surrounding inflammatory changes. No main pancreatic ductal dilatation. Spleen: Normal in size without focal abnormality. Adrenals/Urinary Tract: No adrenal nodule bilaterally. Nonspecific right perinephric  stranding. Right ureteral stent in grossly appropriate position with proximal tip overlying the renal pelvis and distal tip within the urinary bladder lumen adjacent to the Foley catheter balloon. No nephrolithiasis and no hydronephrosis. No definite contour-deforming renal mass. No ureterolithiasis or hydroureter. The urinary bladder is decompressed with a urinary Foley catheter tip and balloon terminating within its lumen. Stomach/Bowel: Sigmoid colon surgical changes. Stomach is within normal limits. No evidence of bowel wall thickening or dilatation. The appendix is not definitely identified with no inflammatory changes in the right lower quadrant to suggest acute appendicitis. Vascular/Lymphatic: No abdominal aorta or iliac aneurysm. Severe atherosclerotic plaque of the aorta and its branches. No abdominal, pelvic, or inguinal lymphadenopathy. Reproductive: Status post hysterectomy. No adnexal masses. Other: No intraperitoneal free fluid. No intraperitoneal free gas. No organized fluid collection. Musculoskeletal: No abdominal wall hernia or abnormality. No suspicious lytic or blastic osseous lesions. No acute displaced fracture. Multilevel severe degenerative changes of the spine. T10-S1 thoracolumbar posterolateral fusion surgical hardware. Stable mild retrolisthesis of L3 on and on L5. Posterior disc osteophyte complex formation at the L2-L3, L3-L4, L4-L5, L5-S1 levels. Multilevel osseous neural foraminal and central canal stenosis. IMPRESSION: 1. Right ureteral stent in grossly appropriate position. Nonspecific asymmetric right perinephric stranding with markedly limited evaluation on this noncontrast study. Recommend correlation with urinalysis for superimposed infection. 2. Multilevel severe degenerative changes of the spine with associated T10 through S1 posterolateral fusion surgical hardware. Multilevel osseous neural foraminal and central canal stenosis. 3.  Aortic Atherosclerosis (ICD10-I70.0).  Electronically Signed   By: Iven Finn M.D.   On: 03/09/2022 22:05   DG Chest 2 View  Result Date: 03/09/2022 CLINICAL DATA:  cough, concern for pna EXAM: CHEST - 2 VIEW COMPARISON:  Chest x-ray 02/16/2021 FINDINGS: The heart and mediastinal contours are unchanged. Aortic calcification. No focal consolidation. No pulmonary edema. No pleural effusion. No pneumothorax. No acute osseous abnormality. Thoracolumbar posterolateral surgical hardware partially visualized. IMPRESSION: 1. No active cardiopulmonary disease. 2.  Aortic Atherosclerosis (ICD10-I70.0). Electronically Signed   By: Iven Finn M.D.   On: 03/09/2022 21:15            LOS: 1 day    Time spent: 35 min    Emeterio Reeve, DO [facility] 03/10/2022, 10:47 AM   Staff may message me via secure chat in [place]  but this may not receive immediate response,  please page for urgent matters!  If 7PM-7AM, please contact night-coverage [email or web address]  Dictation software was used to generate the above note. Typos may occur and escape review, as with typed/written notes. Please contact Dr Sheppard Coil directly for clarity if needed.

## 2022-03-10 NOTE — Progress Notes (Signed)
Pharmacy Antibiotic Note  Stephanie [name] is a 81 y.o. female admitted on 03/09/2022 with UTI.  Pharmacy has been consulted for Cefepime dosing.  Plan: Cefepime 2 gm IV X 1 given in ED on 6/09 @ 2145. Cefepime 2 gm IV Q24H ordered to start on 6/10 @ 2200.   Weight: 72.3 kg (159 lb 8 oz)  Temp (24hrs), Avg:101.5 F (38.6 C), Min:100.6 F (38.1 C), Max:102.4 F (39.1 C)  Recent Labs  Lab 03/09/22 2041 03/09/22 2042  WBC 15.6*  --   CREATININE 1.64*  --   LATICACIDVEN  --  2.2*    Estimated Creatinine Clearance: 28.3 mL/min (A) (by C-G formula based on SCr of 1.64 mg/dL (H)).    Allergies  Allergen Reactions   Infliximab Hives, Shortness Of Breath and Itching   Ibuprofen Itching   Indomethacin Other (See Comments)    headache   Methotrexate Derivatives Other (See Comments)    Headache   Moexipril Other (See Comments)    unknown   Percocet [Oxycodone-Acetaminophen] Itching   Naprosyn [Naproxen] Rash    Antimicrobials this admission:   >>    >>   Dose adjustments this admission:   Microbiology results:  BCx:   UCx:    Sputum:    MRSA PCR:   Thank you for allowing pharmacy to be a part of this patient's care.  Sallie Maker D 03/10/2022 12:23 AM

## 2022-03-10 NOTE — ED Notes (Signed)
Informed RN bed assigned 

## 2022-03-11 DIAGNOSIS — N390 Urinary tract infection, site not specified: Secondary | ICD-10-CM | POA: Diagnosis not present

## 2022-03-11 DIAGNOSIS — A419 Sepsis, unspecified organism: Secondary | ICD-10-CM | POA: Diagnosis not present

## 2022-03-11 LAB — BASIC METABOLIC PANEL
Anion gap: 6 (ref 5–15)
BUN: 24 mg/dL — ABNORMAL HIGH (ref 8–23)
CO2: 22 mmol/L (ref 22–32)
Calcium: 9 mg/dL (ref 8.9–10.3)
Chloride: 111 mmol/L (ref 98–111)
Creatinine, Ser: 1.29 mg/dL — ABNORMAL HIGH (ref 0.44–1.00)
GFR, Estimated: 42 mL/min — ABNORMAL LOW (ref >60)
Glucose, Bld: 128 mg/dL — ABNORMAL HIGH (ref 70–99)
Potassium: 3.3 mmol/L — ABNORMAL LOW (ref 3.5–5.1)
Sodium: 139 mmol/L (ref 135–145)

## 2022-03-11 LAB — GLUCOSE, CAPILLARY
Glucose-Capillary: 130 mg/dL — ABNORMAL HIGH (ref 70–99)
Glucose-Capillary: 130 mg/dL — ABNORMAL HIGH (ref 70–99)
Glucose-Capillary: 131 mg/dL — ABNORMAL HIGH (ref 70–99)
Glucose-Capillary: 162 mg/dL — ABNORMAL HIGH (ref 70–99)
Glucose-Capillary: 230 mg/dL — ABNORMAL HIGH (ref 70–99)
Glucose-Capillary: 240 mg/dL — ABNORMAL HIGH (ref 70–99)

## 2022-03-11 LAB — CBC
HCT: 30.4 % — ABNORMAL LOW (ref 36.0–46.0)
Hemoglobin: 9.4 g/dL — ABNORMAL LOW (ref 12.0–15.0)
MCH: 27 pg (ref 26.0–34.0)
MCHC: 30.9 g/dL (ref 30.0–36.0)
MCV: 87.4 fL (ref 80.0–100.0)
Platelets: 207 10*3/uL (ref 150–400)
RBC: 3.48 MIL/uL — ABNORMAL LOW (ref 3.87–5.11)
RDW: 16 % — ABNORMAL HIGH (ref 11.5–15.5)
WBC: 11.7 10*3/uL — ABNORMAL HIGH (ref 4.0–10.5)
nRBC: 0 % (ref 0.0–0.2)

## 2022-03-11 MED ORDER — POTASSIUM CHLORIDE CRYS ER 20 MEQ PO TBCR
40.0000 meq | EXTENDED_RELEASE_TABLET | Freq: Once | ORAL | Status: AC
Start: 2022-03-11 — End: 2022-03-11
  Administered 2022-03-11: 40 meq via ORAL
  Filled 2022-03-11: qty 2

## 2022-03-11 MED ORDER — CHLORHEXIDINE GLUCONATE CLOTH 2 % EX PADS
6.0000 | MEDICATED_PAD | Freq: Every day | CUTANEOUS | Status: DC
  Administered 2022-03-12 – 2022-03-13 (×2): 6 via TOPICAL

## 2022-03-11 MED ORDER — INSULIN ASPART 100 UNIT/ML IJ SOLN
0.0000 [IU] | Freq: Three times a day (TID) | INTRAMUSCULAR | Status: DC
  Administered 2022-03-11 (×2): 3 [IU] via SUBCUTANEOUS
  Administered 2022-03-12: 7 [IU] via SUBCUTANEOUS
  Administered 2022-03-12 – 2022-03-13 (×3): 2 [IU] via SUBCUTANEOUS
  Filled 2022-03-11 (×6): qty 1

## 2022-03-11 NOTE — Progress Notes (Signed)
PROGRESS NOTE    Stephanie [name]  ELF:[id number] DOB: 1941-06-08  DOA: 03/09/2022 Date of Service: 03/11/22 PCP: Rusty Aus, MD  Brief Narrative / Hospital Course:  Stephanie [name] is a 81 y.o. female with medical history significant for Rheumatoid arthritis on chronic immunosuppressive therapy, DM, HTN, remote history of colon cancer and bladder cancer, CKD 3B,A-fib not on anticoagulation, right hydronephrosis with ureteral stent most recently exchanged on 5/9 and with chronic indwelling Foley exchanged on 6/6, and history of SBO who presents to the ED 03/09/22 with fever, chills and generalized malaise,associated nausea without vomiting and mild shortness of breath. Had a E. coli UTI a couple of weeks ago treated with Bactrim. 06/09: CT R ureteral stent, R perinephric stranding. UA(+). HR 100, TMax 102.24F, RR 24, WBC 15+ = sepsis. --> IV fluids, Cefepime, urology consulted 06/10: afebrile, HR and RR improved, husband reports mentation is much better. Lactic acid trending down. Urology saw patient: no procedures planned at this time, follow cultures and continue IV Abx  06/11: double checked and no UCx orders were in, called lab and confirmed add-on culture to 06/09 specimen. (Note: UCx 05/03 +Ecoli [R] ampicillin, cipro, levoflox, tetracycline ,tmp/smx [S] cefepime, ceftiraxone, nitrofurantoin, pip/tazo, others - based on this culture po cefdinir, cephalexin should be adequate). Advised pt and family there was a delay w/ the culture and expressed apologies that this would delay discharge - given her improvement we discussed the options to 1) go home on po abx based on last UCx  though this would risk worsening, vs 2) stay here and wait for culture results - pt and husband opt for remaining inpatient.    Consultants:  Urology Dr Bernardo Heater see consult note 06/10  Procedures: None     Subjective: Pt seen resting in bed, no concerns, reports feeling overall better and would like to walk around  today if possible.      ASSESSMENT & PLAN:   Principal Problem:   Sepsis secondary to acute pyelonephritis(HCC) Active Problems:   Acute pyelonephritis   Hydronephrosis of right kidney   S/p exchange of ureteral stent 02/06/22   Indwelling Foley catheter present   DM type 2 with diabetic mixed hyperlipidemia (HCC)   Rheumatoid arthritis involving multiple sites with positive rheumatoid factor (HCC)   Essential hypertension   Paroxysmal A-fib (HCC)   Stage 3b chronic kidney disease (HCC)   Normocytic anemia  Sepsis secondary to acute pyelonephritis(HCC) Sepsis criteria includes fever, tachycardia, leukocytosis with lactic acidosis Abnormal UA, recent E. coli UTI, recent stent exchange and Foley catheter exchange Cefepime Exchange Foley  Urology consulted - no procedures and no stent exchange needed, continue IV abx and follow cultures (see note 06/10) 06/11: double checked and no UCx orders were initially placed, called lab and confirmed add-on culture to 06/09 specimen. (Note: UCx 05/03 +Ecoli [R] ampicillin, cipro, levoflox, tetracycline ,tmp/smx [S] cefepime, ceftiraxone, nitrofurantoin, pip/tazo, others - based on this culture po cefdinir, cephalexin should be adequate)  Hydronephrosis of right kidney Right ureteral stent s/p exchange on 02/06/2022 Indwelling Foley catheter s/p exchange on 03/06/2022, 03/09/2022 Urology consulted see note 06/10 and see A/P sepsis - no procedures at this time   Rheumatoid arthritis involving multiple sites with positive rheumatoid factor ([place]) On immunosuppressive medication, leflunomide so at risk for severe infection Will hold leflunomide until showing improvement  DM type 2 with diabetic mixed hyperlipidemia (HCC) Sliding scale insulin coverage Holding Glucotrol and metformin Continue Crestor  Essential hypertension Slightly elevated. Improved this AM Will  need to add agent at this point, BP have been high despite home diltiazem and  metoprolol  Paroxysmal A-fib (HCC) Continue metoprolol and diltiazem.   Not on anticoagulation  Stage 3b chronic kidney disease ([place]) Renal function at baseline on admission Follow BMP  Normocytic anemia At baseline.  Continue to monitor  S/p exchange of ureteral stent 02/06/22 Urology consulted see note 06/10 and see A/P sepsis    DVT prophylaxis: SCD Code Status: Full code Family Communication: husband at bedside today Disposition Plan / TOC needs: Remains inpatient for now, anticipate discharge back to home environment, may need to have PT eval prior to that Barriers to discharge / significant pending items: IV antibiotics until cultures result, monitoring labs/sepsis parameters which seem to be improving at this point             Objective: Vitals:   03/10/22 1840 03/10/22 2015 03/11/22 0525 03/11/22 0655  BP: (!) 187/69 (!) 154/63 (!) 178/71 (!) 153/76  Pulse: 69 82 73 68  Resp:  20 18   Temp:  98.1 F (36.7 C) 98.6 F (37 C)   TempSrc:      SpO2: 98% 98% 99%   Weight:        Intake/Output Summary (Last 24 hours) at 03/11/2022 0738 Last data filed at 03/11/2022 0329 Gross per 24 hour  Intake 3465.87 ml  Output 1700 ml  Net 1765.87 ml    Filed Weights   03/09/22 2342  Weight: 72.3 kg    Examination:  Constitutional:  VS as above General Appearance: alert, well-developed, well-nourished, NAD Eyes: Normal lids and conjunctive, non-icteric sclera Ears, Nose, Mouth, Throat: Normal appearance Neck: No masses, trachea midlin Respiratory: Normal respiratory effort Breath sounds normal, no wheeze/rhonchi/rales Cardiovascular: S1/S2 normal, no murmur/rub/gallop auscultated No lower extremity edema Gastrointestinal: Nontender, no masses Musculoskeletal:  No clubbing/cyanosis of digits Neurological: No cranial nerve deficit on limited exam Psychiatric: Normal judgment/insight Normal mood and affect       Scheduled Medications:    brimonidine  1 drop Both Eyes BID   diltiazem  120 mg Oral Daily   insulin aspart  0-9 Units Subcutaneous TID WC   metoprolol tartrate  50 mg Oral BID   pantoprazole  40 mg Oral Daily   potassium chloride  40 mEq Oral Once   rosuvastatin  10 mg Oral Daily    Continuous Infusions:  ceFEPime (MAXIPIME) IV Stopped (03/11/22 0527)    PRN Medications:  acetaminophen **OR** acetaminophen, hydrALAZINE, HYDROcodone-acetaminophen, ondansetron **OR** ondansetron (ZOFRAN) IV  Antimicrobials:  Anti-infectives (From admission, onward)    Start     Dose/Rate Route Frequency Ordered Stop   03/10/22 2200  ceFEPIme (MAXIPIME) 2 g in sodium chloride 0.9 % 100 mL IVPB        2 g 200 mL/hr over 30 Minutes Intravenous Every 24 hours 03/10/22 0022     03/09/22 2130  ceFEPIme (MAXIPIME) 2 g in sodium chloride 0.9 % 100 mL IVPB        2 g 200 mL/hr over 30 Minutes Intravenous  Once 03/09/22 2120 03/09/22 2212       Data Reviewed: I have personally reviewed following labs and imaging studies  CBC: Recent Labs  Lab 03/09/22 2041 03/10/22 1216 03/11/22 0610  WBC 15.6* 17.7* 11.7*  HGB 9.8* 9.2* 9.4*  HCT 32.2* 29.8* 30.4*  MCV 89.9 89.2 87.4  PLT 219 204 867    Basic Metabolic Panel: Recent Labs  Lab 03/09/22 2041 03/10/22 1216 03/11/22 0610  NA  138 138 139  K 4.2 4.0 3.3*  CL 110 111 111  CO2 21* 22 22  GLUCOSE 184* 142* 128*  BUN 29* 26* 24*  CREATININE 1.64* 1.47* 1.29*  CALCIUM 8.7* 8.8* 9.0    GFR: Estimated Creatinine Clearance: 36 mL/min (A) (by C-G formula based on SCr of 1.29 mg/dL (H)). Liver Function Tests: Recent Labs  Lab 03/09/22 2041  AST 22  ALT 15  ALKPHOS 97  BILITOT 0.8  PROT 6.2*  ALBUMIN 3.3*    Recent Labs  Lab 03/09/22 2041  LIPASE 28    No results for input(s): "AMMONIA" in the last 168 hours. Coagulation Profile: No results for input(s): "INR", "PROTIME" in the last 168 hours. Cardiac Enzymes: No results for input(s): "CKTOTAL",  "CKMB", "CKMBINDEX", "TROPONINI" in the last 168 hours. BNP (last 3 results) No results for input(s): "PROBNP" in the last 8760 hours. HbA1C: Recent Labs    03/10/22 0135  HGBA1C 6.8*    CBG: Recent Labs  Lab 03/10/22 1221 03/10/22 1637 03/10/22 2018 03/10/22 2340 03/11/22 0452  GLUCAP 127* 112* 151* 130* 130*    Lipid Profile: No results for input(s): "CHOL", "HDL", "LDLCALC", "TRIG", "CHOLHDL", "LDLDIRECT" in the last 72 hours. Thyroid Function Tests: No results for input(s): "TSH", "T4TOTAL", "FREET4", "T3FREE", "THYROIDAB" in the last 72 hours. Anemia Panel: No results for input(s): "VITAMINB12", "FOLATE", "FERRITIN", "TIBC", "IRON", "RETICCTPCT" in the last 72 hours. Urine analysis:    Component Value Date/Time   COLORURINE YELLOW (A) 03/09/2022 2041   APPEARANCEUR CLOUDY (A) 03/09/2022 2041   APPEARANCEUR Cloudy (A) 01/31/2022 1302   LABSPEC 1.010 03/09/2022 2041   LABSPEC 1.013 01/04/2015 1239   PHURINE 6.0 03/09/2022 2041   GLUCOSEU NEGATIVE 03/09/2022 2041   GLUCOSEU Negative 01/04/2015 [place] NEGATIVE 03/09/2022 2041   BILIRUBINUR NEGATIVE 03/09/2022 2041   BILIRUBINUR Negative 01/31/2022 1302   BILIRUBINUR Negative 01/04/2015 1239   KETONESUR NEGATIVE 03/09/2022 2041   PROTEINUR >=300 (A) 03/09/2022 2041   NITRITE NEGATIVE 03/09/2022 2041   LEUKOCYTESUR LARGE (A) 03/09/2022 2041   LEUKOCYTESUR 2+ 01/04/2015 1239   Sepsis Labs: '@LABRCNTIP'$ (procalcitonin:4,lacticidven:4)  Recent Results (from the past 240 hour(s))  Resp Panel by RT-PCR (Flu A&B, Covid) Anterior Nasal Swab     Status: None   Collection Time: 03/09/22  8:33 PM   Specimen: Anterior Nasal Swab  Result Value Ref Range Status   SARS Coronavirus 2 by RT PCR NEGATIVE NEGATIVE Final    Comment: (NOTE) SARS-CoV-2 target nucleic acids are NOT DETECTED.  The SARS-CoV-2 RNA is generally detectable in upper respiratory specimens during the acute phase of infection. The  lowest concentration of SARS-CoV-2 viral copies this assay can detect is 138 copies/mL. A negative result does not preclude SARS-Cov-2 infection and should not be used as the sole basis for treatment or other patient management decisions. A negative result may occur with  improper specimen collection/handling, submission of specimen other than nasopharyngeal swab, presence of viral mutation(s) within the areas targeted by this assay, and inadequate number of viral copies(<138 copies/mL). A negative result must be combined with clinical observations, patient history, and epidemiological information. The expected result is Negative.  Fact Sheet for Patients:  EntrepreneurPulse.com.au  Fact Sheet for Healthcare Providers:  IncredibleEmployment.be  This test is no t yet approved or cleared by the [place] FDA and  has been authorized for detection and/or diagnosis of SARS-CoV-2 by FDA under an Emergency Use Authorization (EUA). This EUA will remain  in effect (meaning this test  can be used) for the duration of the COVID-19 declaration under Section 564(b)(1) of the Act, 21 U.S.C.section 360bbb-3(b)(1), unless the authorization is terminated  or revoked sooner.       Influenza A by PCR NEGATIVE NEGATIVE Final   Influenza B by PCR NEGATIVE NEGATIVE Final    Comment: (NOTE) The Xpert Xpress SARS-CoV-2/FLU/RSV plus assay is intended as an aid in the diagnosis of influenza from Nasopharyngeal swab specimens and should not be used as a sole basis for treatment. Nasal washings and aspirates are unacceptable for Xpert Xpress SARS-CoV-2/FLU/RSV testing.  Fact Sheet for Patients: EntrepreneurPulse.com.au  Fact Sheet for Healthcare Providers: IncredibleEmployment.be  This test is not yet approved or cleared by the [place] FDA and has been authorized for detection and/or diagnosis of SARS-CoV-2 by FDA under  an Emergency Use Authorization (EUA). This EUA will remain in effect (meaning this test can be used) for the duration of the COVID-19 declaration under Section 564(b)(1) of the Act, 21 U.S.C. section 360bbb-3(b)(1), unless the authorization is terminated or revoked.  Performed at [facility], [address] [place], [place] [address]   Blood culture (routine x 2)     Status: None (Preliminary result)   Collection Time: 03/09/22  8:33 PM   Specimen: BLOOD  Result Value Ref Range Status   Specimen Description BLOOD RIGHT ANTECUBITAL  Final   Special Requests   Final    BOTTLES DRAWN AEROBIC AND ANAEROBIC Blood Culture adequate volume   Culture   Final    NO GROWTH 2 DAYS Performed at [facility], [address], [place], [place] [address]    Report Status PENDING  Incomplete  Blood culture (routine x 2)     Status: None (Preliminary result)   Collection Time: 03/09/22  8:33 PM   Specimen: BLOOD  Result Value Ref Range Status   Specimen Description BLOOD RIGHT ANTECUBITAL  Final   Special Requests   Final    BOTTLES DRAWN AEROBIC AND ANAEROBIC Blood Culture results may not be optimal due to an inadequate volume of blood received in culture bottles   Culture   Final    NO GROWTH 2 DAYS Performed at [facility], [address], [place], [place] [address]    Report Status PENDING  Incomplete         Radiology Studies last 96 hours: CT Renal Stone Study  Result Date: 03/09/2022 CLINICAL DATA:  Flank pain, kidney stone suspected EXAM: CT ABDOMEN AND PELVIS WITHOUT CONTRAST TECHNIQUE: Multidetector CT imaging of the abdomen and pelvis was performed following the standard protocol without IV contrast. RADIATION DOSE REDUCTION: This exam was performed according to the departmental dose-optimization program which includes automated exposure control, adjustment of the mA and/or kV according to patient size and/or use of iterative reconstruction technique.  COMPARISON:  CT abdomen pelvis 02/13/2021 FINDINGS: Lower chest: No acute abnormality. Hepatobiliary: No focal liver abnormality. No gallstones, gallbladder wall thickening, or pericholecystic fluid. No biliary dilatation. Pancreas: No focal lesion. Normal pancreatic contour. No surrounding inflammatory changes. No main pancreatic ductal dilatation. Spleen: Normal in size without focal abnormality. Adrenals/Urinary Tract: No adrenal nodule bilaterally. Nonspecific right perinephric stranding. Right ureteral stent in grossly appropriate position with proximal tip overlying the renal pelvis and distal tip within the urinary bladder lumen adjacent to the Foley catheter balloon. No nephrolithiasis and no hydronephrosis. No definite contour-deforming renal mass. No ureterolithiasis or hydroureter. The urinary bladder is decompressed with a urinary Foley catheter tip and balloon terminating within  its lumen. Stomach/Bowel: Sigmoid colon surgical changes. Stomach is within normal limits. No evidence of bowel wall thickening or dilatation. The appendix is not definitely identified with no inflammatory changes in the right lower quadrant to suggest acute appendicitis. Vascular/Lymphatic: No abdominal aorta or iliac aneurysm. Severe atherosclerotic plaque of the aorta and its branches. No abdominal, pelvic, or inguinal lymphadenopathy. Reproductive: Status post hysterectomy. No adnexal masses. Other: No intraperitoneal free fluid. No intraperitoneal free gas. No organized fluid collection. Musculoskeletal: No abdominal wall hernia or abnormality. No suspicious lytic or blastic osseous lesions. No acute displaced fracture. Multilevel severe degenerative changes of the spine. T10-S1 thoracolumbar posterolateral fusion surgical hardware. Stable mild retrolisthesis of L3 on and on L5. Posterior disc osteophyte complex formation at the L2-L3, L3-L4, L4-L5, L5-S1 levels. Multilevel osseous neural foraminal and central canal  stenosis. IMPRESSION: 1. Right ureteral stent in grossly appropriate position. Nonspecific asymmetric right perinephric stranding with markedly limited evaluation on this noncontrast study. Recommend correlation with urinalysis for superimposed infection. 2. Multilevel severe degenerative changes of the spine with associated T10 through S1 posterolateral fusion surgical hardware. Multilevel osseous neural foraminal and central canal stenosis. 3.  Aortic Atherosclerosis (ICD10-I70.0). Electronically Signed   By: Iven Finn M.D.   On: 03/09/2022 22:05   DG Chest 2 View  Result Date: 03/09/2022 CLINICAL DATA:  cough, concern for pna EXAM: CHEST - 2 VIEW COMPARISON:  Chest x-ray 02/16/2021 FINDINGS: The heart and mediastinal contours are unchanged. Aortic calcification. No focal consolidation. No pulmonary edema. No pleural effusion. No pneumothorax. No acute osseous abnormality. Thoracolumbar posterolateral surgical hardware partially visualized. IMPRESSION: 1. No active cardiopulmonary disease. 2.  Aortic Atherosclerosis (ICD10-I70.0). Electronically Signed   By: Iven Finn M.D.   On: 03/09/2022 21:15            LOS: 2 days    Time spent: 35 min    Emeterio Reeve, DO [facility] 03/11/2022, 7:38 AM   Staff may message me via secure chat in [place]  but this may not receive immediate response,  please page for urgent matters!  If 7PM-7AM, please contact night-coverage [email or web address]  Dictation software was used to generate the above note. Typos may occur and escape review, as with typed/written notes. Please contact Dr Sheppard Coil directly for clarity if needed.

## 2022-03-11 NOTE — Evaluation (Signed)
Physical Therapy Evaluation Patient Details Name: Stephanie [name] MRN: [id number] DOB: 05-21-1941 Today's Date: 03/11/2022  History of Present Illness  81 y.o. female with medical history significant for Rheumatoid arthritis on chronic immunosuppressive therapy, DM, HTN, remote history of colon cancer and bladder cancer, CKD 3B,A-fib not on anticoagulation, right hydronephrosis with ureteral stent most recently exchanged on 5/9 and with chronic indwelling Foley exchanged on 6/6, and history of SBO who presents to the ED 03/09/22 with fever, chills and generalized malaise,associated nausea without vomiting and mild shortness of breath. Had a E. coli UTI a couple of weeks ago treated with Bactrim.  Clinical Impression  Pt did well with mobility and was able to ambulate ~70 ft with slow but safe cadence and heavy UE reliance.  Her O2 remained in the mid 90s but HR did increase to ~130 quickly and stayed in the 130s t/o most of the ambulation effort.  Back down to ~100 after sitting 1-2 minutes.  Pt endorses some fatigue, more so than baseline, but also that she does not typically walk much father than we did today anyway at baseline.  Pt safe to return home once medically ready for d/c, and would benefit from HHPT to address functional limitations.      Recommendations for follow up therapy are one component of a multi-disciplinary discharge planning process, led by the attending physician.  Recommendations may be updated based on patient status, additional functional criteria and insurance authorization.  Follow Up Recommendations Home health PT    Assistance Recommended at Discharge PRN  Patient can return home with the following  Assist for transportation;Assistance with cooking/housework    Equipment Recommendations None recommended by PT  Recommendations for Other Services       Functional Status Assessment Patient has had a recent decline in their functional status and demonstrates the  ability to make significant improvements in function in a reasonable and predictable amount of time.     Precautions / Restrictions Precautions Precautions: Fall Restrictions Weight Bearing Restrictions: No      Mobility  Bed Mobility Overal bed mobility: Modified Independent             General bed mobility comments: easily gets to sitting EOB w/o assist    Transfers Overall transfer level: Modified independent Equipment used: Rolling walker (2 wheels)               General transfer comment: heavy UE reliance, no physical assist to attain standing in walker    Ambulation/Gait Ambulation/Gait assistance: Supervision Gait Distance (Feet): 70 Feet Assistive device: Rolling walker (2 wheels)         General Gait Details: Pt able to do slow but steady bout of ambulation.  Chronic L knee hyperextension with heavy UE reliance on walker.  Pt with gradually increasing fatigue and reports not typically doing more than ~100 feet of walking at baseline.  Pt's HR increased from 90s to 130s quickly during ambulation but remained in the 130s t/o remainder of the effort  Stairs            Wheelchair Mobility    Modified Rankin (Stroke Patients Only)       Balance Overall balance assessment: Needs assistance Sitting-balance support: No upper extremity supported Sitting balance-Leahy Scale: Normal     Standing balance support: Bilateral upper extremity supported Standing balance-Leahy Scale: Fair Standing balance comment: no unsteadiness with walker, but clearly UE reliant with dynamic standing tasks  Pertinent Vitals/Pain Pain Assessment Pain Assessment: No/denies pain    Home Living Family/patient expects to be discharged to:: Private residence Living Arrangements: Spouse/significant other Available Help at Discharge: Available 24 hours/day;Family   Home Access: Stairs to enter   Entrance Stairs-Number of Steps:  2 (~4" steps)   Home Layout: One level Home Equipment: Conservation officer, nature (2 wheels);Rollator (4 wheels);Wheelchair - manual      Prior Function Prior Level of Function : Independent/Modified Independent             Mobility Comments: Pt reports she does not typically walk far but can manage in the home with the walker ADLs Comments: dresses, bathes, toilets w/o assist     Hand Dominance        Extremity/Trunk Assessment   Upper Extremity Assessment Upper Extremity Assessment: Generalized weakness    Lower Extremity Assessment Lower Extremity Assessment: Generalized weakness       Communication   Communication: No difficulties  Cognition Arousal/Alertness: Awake/alert Behavior During Therapy: WFL for tasks assessed/performed Overall Cognitive Status: Within Functional Limits for tasks assessed                                 General Comments: with with some AMS on arrival, much closer to baseline now per husband (and pt)        General Comments      Exercises     Assessment/Plan    PT Assessment Patient needs continued PT services  PT Problem List Decreased strength;Decreased activity tolerance;Decreased range of motion;Decreased balance;Decreased mobility;Decreased knowledge of use of DME;Decreased safety awareness;Cardiopulmonary status limiting activity       PT Treatment Interventions DME instruction;Stair training;Gait training;Functional mobility training;Therapeutic activities;Therapeutic exercise;Balance training;Patient/family education    PT Goals (Current goals can be found in the Care Plan section)  Acute Rehab PT Goals Patient Stated Goal: go home PT Goal Formulation: With patient Time For Goal Achievement: 03/24/22 Potential to Achieve Goals: Good    Frequency Min 2X/week     Co-evaluation               AM-PAC PT "6 Clicks" Mobility  Outcome Measure Help needed turning from your back to your side while in a flat  bed without using bedrails?: None Help needed moving from lying on your back to sitting on the side of a flat bed without using bedrails?: None Help needed moving to and from a bed to a chair (including a wheelchair)?: None Help needed standing up from a chair using your arms (e.g., wheelchair or bedside chair)?: None Help needed to walk in hospital room?: A Little Help needed climbing 3-5 steps with a railing? : A Little 6 Click Score: 22    End of Session Equipment Utilized During Treatment: Gait belt Activity Tolerance: Patient limited by fatigue Patient left: with chair alarm set;with call bell/phone within reach Nurse Communication: Mobility status PT Visit Diagnosis: Other abnormalities of gait and mobility (R26.89);Muscle weakness (generalized) (M62.81)    Time: 5188-4166 PT Time Calculation (min) (ACUTE ONLY): 21 min   Charges:   PT Evaluation $PT Eval Low Complexity: 1 Low PT Treatments $Gait Training: 8-22 mins        Kreg Shropshire, DPT 03/11/2022, 12:48 PM

## 2022-03-11 NOTE — TOC Progression Note (Signed)
Transition of Care [facility]) - Progression Note    Patient Details  Name: Stephanie [name] MRN: [id number] Date of Birth: 01-17-1941  Transition [facility]) CM/SW Contact  [name], RN Phone Number: 03/11/2022, 3:43 PM  Clinical Narrative:  03/11/22 Spoke with patient regarding [facility] PT recommendation on discharge. Patient deferred to spouse. No preference on agency at this time. Has used [facility] 2 years ago. Patient has chronic Foley and goes to urologist office once a month for it to be changed so no HH RN needed for chronic Foley catheter. [facility] accepted for [facility] PT on discharge, orders requested from provider.   Spouse indicated no issues obtaining or paying for medications, no transportation issues, PCP is Dr [name]. RX is Paediatric nurse in [place], [place].   [name] RN CM          Expected Discharge Plan and Services                                     HH Arranged: PT [place] Agency: [place] Date [facility] Agency Contacted: 03/11/22 Time [place]: [phone] Representative spoke with at [place]: [name] at [place] ([place]) Interventions    Readmission Risk Interventions    02/14/2021    1:28 PM 06/21/2020    1:54 PM 06/19/2020    2:21 PM  Readmission Risk Prevention Plan  Transportation Screening Complete Complete Complete  PCP or Specialist Appt within 3-5 Days Complete Complete   HRI or [place]  Complete   Social Work Consult for [place] Planning/Counseling Complete Complete   Palliative Care Screening Not Applicable Not Applicable   Medication Review Press photographer) Complete  Complete  PCP or Specialist appointment within 3-5 days of discharge   Complete  HRI or [place]   Complete  SW Recovery Care/Counseling Consult   Complete  [place]   Not Applicable

## 2022-03-12 DIAGNOSIS — A419 Sepsis, unspecified organism: Secondary | ICD-10-CM | POA: Diagnosis not present

## 2022-03-12 DIAGNOSIS — N390 Urinary tract infection, site not specified: Secondary | ICD-10-CM | POA: Diagnosis not present

## 2022-03-12 DIAGNOSIS — E876 Hypokalemia: Secondary | ICD-10-CM

## 2022-03-12 LAB — CBC
HCT: 27.9 % — ABNORMAL LOW (ref 36.0–46.0)
Hemoglobin: 8.8 g/dL — ABNORMAL LOW (ref 12.0–15.0)
MCH: 27.7 pg (ref 26.0–34.0)
MCHC: 31.5 g/dL (ref 30.0–36.0)
MCV: 87.7 fL (ref 80.0–100.0)
Platelets: 188 10*3/uL (ref 150–400)
RBC: 3.18 MIL/uL — ABNORMAL LOW (ref 3.87–5.11)
RDW: 15.9 % — ABNORMAL HIGH (ref 11.5–15.5)
WBC: 8.6 10*3/uL (ref 4.0–10.5)
nRBC: 0 % (ref 0.0–0.2)

## 2022-03-12 LAB — GLUCOSE, CAPILLARY
Glucose-Capillary: 181 mg/dL — ABNORMAL HIGH (ref 70–99)
Glucose-Capillary: 307 mg/dL — ABNORMAL HIGH (ref 70–99)
Glucose-Capillary: 178 mg/dL — ABNORMAL HIGH (ref 70–99)
Glucose-Capillary: 182 mg/dL — ABNORMAL HIGH (ref 70–99)

## 2022-03-12 LAB — BASIC METABOLIC PANEL
Anion gap: 6 (ref 5–15)
BUN: 31 mg/dL — ABNORMAL HIGH (ref 8–23)
CO2: 22 mmol/L (ref 22–32)
Calcium: 8.7 mg/dL — ABNORMAL LOW (ref 8.9–10.3)
Chloride: 111 mmol/L (ref 98–111)
Creatinine, Ser: 1.4 mg/dL — ABNORMAL HIGH (ref 0.44–1.00)
GFR, Estimated: 38 mL/min — ABNORMAL LOW (ref >60)
Glucose, Bld: 175 mg/dL — ABNORMAL HIGH (ref 70–99)
Potassium: 3.7 mmol/L (ref 3.5–5.1)
Sodium: 139 mmol/L (ref 135–145)

## 2022-03-12 MED ORDER — DILTIAZEM HCL ER COATED BEADS 180 MG PO CP24
180.0000 mg | ORAL_CAPSULE | Freq: Every day | ORAL | Status: DC
  Administered 2022-03-13: 180 mg via ORAL
  Filled 2022-03-12: qty 1

## 2022-03-12 NOTE — Care Management Important Message (Signed)
Important Message  Patient Details  Name: Stephanie [name] MRN: [id number] Date of Birth: 17-Mar-1941   Medicare Important Message Given:  Yes     Juliann Pulse A Laney Louderback 03/12/2022, 1:04 PM

## 2022-03-12 NOTE — Assessment & Plan Note (Signed)
   Repleted, monitor

## 2022-03-12 NOTE — Progress Notes (Signed)
Physical Therapy Treatment Patient Details Name: Stephanie [name] MRN: [id number] DOB: 21-May-1941 Today's Date: 03/12/2022   History of Present Illness 81 y.o. female with medical history significant for Rheumatoid arthritis on chronic immunosuppressive therapy, DM, HTN, remote history of colon cancer and bladder cancer, CKD 3B,A-fib not on anticoagulation, right hydronephrosis with ureteral stent most recently exchanged on 5/9 and with chronic indwelling Foley exchanged on 6/6, and history of SBO who presents to the ED 03/09/22 with fever, chills and generalized malaise,associated nausea without vomiting and mild shortness of breath. Had a E. coli UTI a couple of weeks ago treated with Bactrim.    PT Comments    OOB and walks x 1 lap on unit with RW and min guard.  Hyperextension LLE noted but baseline per pt.  Recliner follow available but not necessary today.  Participated in exercises as described below.  Pt leased with her ability today.   Recommendations for follow up therapy are one component of a multi-disciplinary discharge planning process, led by the attending physician.  Recommendations may be updated based on patient status, additional functional criteria and insurance authorization.  Follow Up Recommendations  Home health PT     Assistance Recommended at Discharge PRN  Patient can return home with the following Assist for transportation;Assistance with cooking/housework;A little help with walking and/or transfers   Equipment Recommendations  None recommended by PT    Recommendations for Other Services       Precautions / Restrictions Precautions Precautions: Fall Restrictions Weight Bearing Restrictions: No     Mobility  Bed Mobility Overal bed mobility: Modified Independent                  Transfers Overall transfer level: Modified independent Equipment used: Rolling walker (2 wheels)               General transfer comment: heavy UE reliance, no  physical assist to attain standing in walker    Ambulation/Gait Ambulation/Gait assistance: Supervision Gait Distance (Feet): 160 Feet Assistive device: Rolling walker (2 wheels) Gait Pattern/deviations: Step-through pattern, Decreased step length - right, Decreased step length - left Gait velocity: dec     General Gait Details: Pt able to do slow but steady bout of ambulation.  Chronic L knee hyperextension with heavy UE reliance on walker.  Pt does well with complete lap today.   Stairs             Wheelchair Mobility    Modified Rankin (Stroke Patients Only)       Balance Overall balance assessment: Needs assistance Sitting-balance support: No upper extremity supported Sitting balance-Leahy Scale: Normal     Standing balance support: Bilateral upper extremity supported Standing balance-Leahy Scale: Fair Standing balance comment: no unsteadiness with walker, but clearly UE reliant with dynamic standing tasks                            Cognition Arousal/Alertness: Awake/alert   Overall Cognitive Status: Within Functional Limits for tasks assessed                                          Exercises Other Exercises Other Exercises: standing AROM - marches and SLR    General Comments        Pertinent Vitals/Pain Pain Assessment Pain Assessment: No/denies pain    Home Living  Prior Function            PT Goals (current goals can now be found in the care plan section) Progress towards PT goals: Progressing toward goals    Frequency    Min 2X/week      PT Plan Current plan remains appropriate    Co-evaluation              AM-PAC PT "6 Clicks" Mobility   Outcome Measure  Help needed turning from your back to your side while in a flat bed without using bedrails?: None Help needed moving from lying on your back to sitting on the side of a flat bed without using bedrails?:  None Help needed moving to and from a bed to a chair (including a wheelchair)?: None Help needed standing up from a chair using your arms (e.g., wheelchair or bedside chair)?: None Help needed to walk in hospital room?: A Little Help needed climbing 3-5 steps with a railing? : A Little 6 Click Score: 22    End of Session Equipment Utilized During Treatment: Gait belt Activity Tolerance: Patient tolerated treatment well Patient left: with chair alarm set;with call bell/phone within reach;with family/visitor present Nurse Communication: Mobility status PT Visit Diagnosis: Other abnormalities of gait and mobility (R26.89);Muscle weakness (generalized) (M62.81)     Time: 0112-0128 PT Time Calculation (min) (ACUTE ONLY): 16 min  Charges:  $Gait Training: 8-22 mins                   Chesley Noon, PTA 03/12/22, 1:45 PM

## 2022-03-12 NOTE — Inpatient Diabetes Management (Signed)
Inpatient Diabetes Program Recommendations  AACE/ADA: New Consensus Statement on Inpatient Glycemic Control (2015)  Target Ranges:  Prepandial:   less than 140 mg/dL      Peak postprandial:   less than 180 mg/dL (1-2 hours)      Critically ill patients:  140 - 180 mg/dL   Lab Results  Component Value Date   GLUCAP 307 (H) 03/12/2022   HGBA1C 6.8 (H) 03/10/2022    Review of Glycemic Control  Latest Reference Range & Units 03/11/22 08:09 03/11/22 11:42 03/11/22 17:07 03/11/22 21:28 03/12/22 08:19 03/12/22 11:38  Glucose-Capillary 70 - 99 mg/dL 131 (H) 230 (H) 240 (H) 162 (H) 181 (H) 307 (H)  (H): Data is abnormally high  Diabetes history: DM2 Outpatient Diabetes medications: Metformin 500 mg QD, Glipizide 5 mg BID Current orders for Inpatient glycemic control: Novolog 0-9 units TID  Inpatient Diabetes Program Recommendations:    Postprandials elevated.  Please consider:  Novolog 3 units TID with meals IF consumes at least 50%.  Will continue to follow while inpatient.  Thank you, Reche Dixon, MSN, [place] Diabetes Coordinator Inpatient Diabetes Program [phone] (team pager from 8a-5p)

## 2022-03-12 NOTE — Progress Notes (Signed)
PROGRESS NOTE    Stephanie [name]  WUJ:[id number] DOB: Aug 12, 1941  DOA: 03/09/2022 Date of Service: 03/12/22 PCP: Rusty Aus, MD  Brief Narrative / Hospital Course:  Stephanie [name] is a 81 y.o. female with medical history significant for Rheumatoid arthritis on chronic immunosuppressive therapy, DM, HTN, remote history of colon cancer and bladder cancer, CKD 3B,A-fib not on anticoagulation, right hydronephrosis with ureteral stent most recently exchanged on 5/9 and with chronic indwelling Foley exchanged on 6/6, and history of SBO who presents to the ED 03/09/22 with fever, chills and generalized malaise,associated nausea without vomiting and mild shortness of breath. Had a E. coli UTI a couple of weeks ago treated with Bactrim. 06/09: CT R ureteral stent, R perinephric stranding. UA(+). HR 100, TMax 102.31F, RR 24, WBC 15+ = sepsis. --> IV fluids, Cefepime, urology consulted 06/10: afebrile, HR and RR improved, husband reports mentation is much better. Lactic acid trending down. Urology saw patient: no procedures planned at this time, follow cultures and continue IV Abx  06/11: double checked and no UCx orders were in, called lab and confirmed add-on culture to 06/09 specimen. (Note: UCx 05/03 +Ecoli [R] ampicillin, cipro, levoflox, tetracycline ,tmp/smx [S] cefepime, ceftiraxone, nitrofurantoin, pip/tazo, others - based on this culture po cefdinir, cephalexin should be adequate). Advised pt and family there was a delay w/ the culture and expressed apologies that this would delay discharge - given her improvement we discussed the options to 1) go home on po abx based on last UCx  though this would risk worsening, vs 2) stay here and wait for culture results - pt and husband opt for remaining inpatient.  06/12: doing well, maintaining IV Abx pending UCx results    Consultants:  Urology Dr Bernardo Heater see consult note 06/10  Procedures: None     Subjective: Pt seen in chair at bedside, no concerns,  feeling well today, eating breakfast      ASSESSMENT & PLAN:   Principal Problem:   Sepsis secondary to acute pyelonephritis(HCC) Active Problems:   Acute pyelonephritis   Hydronephrosis of right kidney   S/p exchange of ureteral stent 02/06/22   Indwelling Foley catheter present   DM type 2 with diabetic mixed hyperlipidemia (HCC)   Rheumatoid arthritis involving multiple sites with positive rheumatoid factor ([place])   Essential hypertension   Paroxysmal A-fib ([place])   Stage 3b chronic kidney disease ([place])   Normocytic anemia  Sepsis secondary to acute pyelonephritis(HCC) POA Sepsis resolved  Sepsis criteria includes fever, tachycardia, leukocytosis with lactic acidosis Abnormal UA, recent E. coli UTI, recent stent exchange and Foley catheter exchange Cefepime Exchange Foley  Urology consulted - no procedures and no stent exchange needed, continue IV abx and follow cultures (see note 06/10) 06/11: double checked and no UCx orders were initially placed, called lab and confirmed add-on culture to 06/09 specimen. (Note: UCx 05/03 +Ecoli [R] ampicillin, cipro, levoflox, tetracycline ,tmp/smx [S] cefepime, ceftiraxone, nitrofurantoin, pip/tazo, others - based on this culture po cefdinir, cephalexin should be adequate) 06/12: doing well, maintaining IV Abx pending UCx results   Hydronephrosis of right kidney Right ureteral stent s/p exchange on 02/06/2022 Indwelling Foley catheter s/p exchange on 03/06/2022, 03/09/2022 Urology consulted see note 06/10 and see A/P sepsis - no procedures at this time   Rheumatoid arthritis involving multiple sites with positive rheumatoid factor ([place]) On immunosuppressive medication, leflunomide so at risk for severe infection Will hold leflunomide until showing improvement, may resume tomorrow   DM type 2 with diabetic mixed  hyperlipidemia (HCC) Sliding scale insulin coverage Holding Glucotrol and metformin Continue Crestor  Essential  hypertension Slightly elevated. Improved this AM Increased home dose diltiazem   Paroxysmal A-fib (HCC) Continue metoprolol and diltiazem.   Not on anticoagulation  Stage 3b chronic kidney disease ([place]) Renal function at baseline on admission Follow BMP  Normocytic anemia At baseline.  Continue to monitor  S/p exchange of ureteral stent 02/06/22 Urology consulted see note 06/10 and see A/P sepsis    DVT prophylaxis: SCD Code Status: Full code Family Communication: husband at bedside today Disposition Plan / TOC needs: Remains inpatient for now, anticipate discharge back to home environment w/ [facility] Barriers to discharge / significant pending items: IV antibiotics until cultures result, monitoring labs/sepsis parameters which are improving             Objective: Vitals:   03/11/22 2314 03/12/22 0539 03/12/22 0644 03/12/22 0806  BP: (!) 145/73 (!) 161/80 (!) 164/64 (!) 151/63  Pulse: 67 74 79 77  Resp: '16 16  16  '$ Temp:  98.6 F (37 C)  98.6 F (37 C)  TempSrc:  Oral    SpO2: 95% 97% 95% 97%  Weight:        Intake/Output Summary (Last 24 hours) at 03/12/2022 1556 Last data filed at 03/12/2022 0900 Gross per 24 hour  Intake --  Output 1375 ml  Net -1375 ml   Filed Weights   03/09/22 2342  Weight: 72.3 kg    Examination:  Constitutional:  VS as above General Appearance: alert, well-developed, well-nourished, NAD Eyes: Normal lids and conjunctive, non-icteric sclera Ears, Nose, Mouth, Throat: Normal appearance Neck: No masses, trachea midlin Respiratory: Normal respiratory effort Breath sounds normal, no wheeze/rhonchi/rales Cardiovascular: S1/S2 normal, no murmur/rub/gallop auscultated No lower extremity edema Gastrointestinal: Nontender, no masses Musculoskeletal:  No clubbing/cyanosis of digits Neurological: No cranial nerve deficit on limited exam Psychiatric: Normal judgment/insight Normal mood and affect       Scheduled  Medications:   brimonidine  1 drop Both Eyes BID   Chlorhexidine Gluconate Cloth  6 each Topical Q0600   [START ON 03/13/2022] diltiazem  180 mg Oral Daily   insulin aspart  0-9 Units Subcutaneous TID WC   metoprolol tartrate  50 mg Oral BID   pantoprazole  40 mg Oral Daily   rosuvastatin  10 mg Oral Daily    Continuous Infusions:  ceFEPime (MAXIPIME) IV 2 g (03/11/22 2308)    PRN Medications:  acetaminophen **OR** acetaminophen, hydrALAZINE, HYDROcodone-acetaminophen, ondansetron **OR** ondansetron (ZOFRAN) IV  Antimicrobials:  Anti-infectives (From admission, onward)    Start     Dose/Rate Route Frequency Ordered Stop   03/10/22 2200  ceFEPIme (MAXIPIME) 2 g in sodium chloride 0.9 % 100 mL IVPB        2 g 200 mL/hr over 30 Minutes Intravenous Every 24 hours 03/10/22 0022     03/09/22 2130  ceFEPIme (MAXIPIME) 2 g in sodium chloride 0.9 % 100 mL IVPB        2 g 200 mL/hr over 30 Minutes Intravenous  Once 03/09/22 2120 03/09/22 2212       Data Reviewed: I have personally reviewed following labs and imaging studies  CBC: Recent Labs  Lab 03/09/22 2041 03/10/22 1216 03/11/22 0610 03/12/22 0615  WBC 15.6* 17.7* 11.7* 8.6  HGB 9.8* 9.2* 9.4* 8.8*  HCT 32.2* 29.8* 30.4* 27.9*  MCV 89.9 89.2 87.4 87.7  PLT 219 204 207 314   Basic Metabolic Panel: Recent Labs  Lab 03/09/22  2041 03/10/22 1216 03/11/22 0610 03/12/22 0615  NA 138 138 139 139  K 4.2 4.0 3.3* 3.7  CL 110 111 111 111  CO2 21* '22 22 22  '$ GLUCOSE 184* 142* 128* 175*  BUN 29* 26* 24* 31*  CREATININE 1.64* 1.47* 1.29* 1.40*  CALCIUM 8.7* 8.8* 9.0 8.7*   GFR: Estimated Creatinine Clearance: 33.2 mL/min (A) (by C-G formula based on SCr of 1.4 mg/dL (H)). Liver Function Tests: Recent Labs  Lab 03/09/22 2041  AST 22  ALT 15  ALKPHOS 97  BILITOT 0.8  PROT 6.2*  ALBUMIN 3.3*   Recent Labs  Lab 03/09/22 2041  LIPASE 28   No results for input(s): "AMMONIA" in the last 168 hours. Coagulation  Profile: No results for input(s): "INR", "PROTIME" in the last 168 hours. Cardiac Enzymes: No results for input(s): "CKTOTAL", "CKMB", "CKMBINDEX", "TROPONINI" in the last 168 hours. BNP (last 3 results) No results for input(s): "PROBNP" in the last 8760 hours. HbA1C: Recent Labs    03/10/22 0135  HGBA1C 6.8*   CBG: Recent Labs  Lab 03/11/22 1142 03/11/22 1707 03/11/22 2128 03/12/22 0819 03/12/22 1138  GLUCAP 230* 240* 162* 181* 307*   Lipid Profile: No results for input(s): "CHOL", "HDL", "LDLCALC", "TRIG", "CHOLHDL", "LDLDIRECT" in the last 72 hours. Thyroid Function Tests: No results for input(s): "TSH", "T4TOTAL", "FREET4", "T3FREE", "THYROIDAB" in the last 72 hours. Anemia Panel: No results for input(s): "VITAMINB12", "FOLATE", "FERRITIN", "TIBC", "IRON", "RETICCTPCT" in the last 72 hours. Urine analysis:    Component Value Date/Time   COLORURINE YELLOW (A) 03/09/2022 2041   APPEARANCEUR CLOUDY (A) 03/09/2022 2041   APPEARANCEUR Cloudy (A) 01/31/2022 1302   LABSPEC 1.010 03/09/2022 2041   LABSPEC 1.013 01/04/2015 1239   PHURINE 6.0 03/09/2022 2041   GLUCOSEU NEGATIVE 03/09/2022 2041   GLUCOSEU Negative 01/04/2015 [place] NEGATIVE 03/09/2022 2041   BILIRUBINUR NEGATIVE 03/09/2022 2041   BILIRUBINUR Negative 01/31/2022 1302   BILIRUBINUR Negative 01/04/2015 1239   KETONESUR NEGATIVE 03/09/2022 2041   PROTEINUR >=300 (A) 03/09/2022 2041   NITRITE NEGATIVE 03/09/2022 2041   LEUKOCYTESUR LARGE (A) 03/09/2022 2041   LEUKOCYTESUR 2+ 01/04/2015 1239   Sepsis Labs: '@LABRCNTIP'$ (procalcitonin:4,lacticidven:4)  Recent Results (from the past 240 hour(s))  Resp Panel by RT-PCR (Flu A&B, Covid) Anterior Nasal Swab     Status: None   Collection Time: 03/09/22  8:33 PM   Specimen: Anterior Nasal Swab  Result Value Ref Range Status   SARS Coronavirus 2 by RT PCR NEGATIVE NEGATIVE Final    Comment: (NOTE) SARS-CoV-2 target nucleic acids are NOT DETECTED.  The  SARS-CoV-2 RNA is generally detectable in upper respiratory specimens during the acute phase of infection. The lowest concentration of SARS-CoV-2 viral copies this assay can detect is 138 copies/mL. A negative result does not preclude SARS-Cov-2 infection and should not be used as the sole basis for treatment or other patient management decisions. A negative result may occur with  improper specimen collection/handling, submission of specimen other than nasopharyngeal swab, presence of viral mutation(s) within the areas targeted by this assay, and inadequate number of viral copies(<138 copies/mL). A negative result must be combined with clinical observations, patient history, and epidemiological information. The expected result is Negative.  Fact Sheet for Patients:  EntrepreneurPulse.com.au  Fact Sheet for Healthcare Providers:  IncredibleEmployment.be  This test is no t yet approved or cleared by the [place] FDA and  has been authorized for detection and/or diagnosis of SARS-CoV-2 by FDA under an Emergency Use  Authorization (EUA). This EUA will remain  in effect (meaning this test can be used) for the duration of the COVID-19 declaration under Section 564(b)(1) of the Act, 21 U.S.C.section 360bbb-3(b)(1), unless the authorization is terminated  or revoked sooner.       Influenza A by PCR NEGATIVE NEGATIVE Final   Influenza B by PCR NEGATIVE NEGATIVE Final    Comment: (NOTE) The Xpert Xpress SARS-CoV-2/FLU/RSV plus assay is intended as an aid in the diagnosis of influenza from Nasopharyngeal swab specimens and should not be used as a sole basis for treatment. Nasal washings and aspirates are unacceptable for Xpert Xpress SARS-CoV-2/FLU/RSV testing.  Fact Sheet for Patients: EntrepreneurPulse.com.au  Fact Sheet for Healthcare Providers: IncredibleEmployment.be  This test is not yet approved or  cleared by the [place] FDA and has been authorized for detection and/or diagnosis of SARS-CoV-2 by FDA under an Emergency Use Authorization (EUA). This EUA will remain in effect (meaning this test can be used) for the duration of the COVID-19 declaration under Section 564(b)(1) of the Act, 21 U.S.C. section 360bbb-3(b)(1), unless the authorization is terminated or revoked.  Performed at [facility], [address] [place], [place] [address]   Blood culture (routine x 2)     Status: None (Preliminary result)   Collection Time: 03/09/22  8:33 PM   Specimen: BLOOD  Result Value Ref Range Status   Specimen Description BLOOD RIGHT ANTECUBITAL  Final   Special Requests   Final    BOTTLES DRAWN AEROBIC AND ANAEROBIC Blood Culture adequate volume   Culture   Final    NO GROWTH 3 DAYS Performed at [facility], [address] [place], [place] [address]    Report Status PENDING  Incomplete  Blood culture (routine x 2)     Status: None (Preliminary result)   Collection Time: 03/09/22  8:33 PM   Specimen: BLOOD  Result Value Ref Range Status   Specimen Description BLOOD RIGHT ANTECUBITAL  Final   Special Requests   Final    BOTTLES DRAWN AEROBIC AND ANAEROBIC Blood Culture results may not be optimal due to an inadequate volume of blood received in culture bottles   Culture   Final    NO GROWTH 3 DAYS Performed at [facility], [address] [place], [place] [address]    Report Status PENDING  Incomplete  Urine Culture     Status: Abnormal (Preliminary result)   Collection Time: 03/09/22  8:41 PM   Specimen: Urine, Random  Result Value Ref Range Status   Specimen Description   Final    URINE, RANDOM Performed at [facility], [address], [place], [place] [address]    Special Requests   Final    NONE Performed at [facility], [address], [place], [place] [address]    Culture (A)  Final    >=100,000 COLONIES/mL  ESCHERICHIA COLI 80,000 COLONIES/mL KLEBSIELLA PNEUMONIAE    Report Status PENDING  Incomplete         Radiology Studies last 96 hours: CT Renal Stone Study  Result Date: 03/09/2022 CLINICAL DATA:  Flank pain, kidney stone suspected EXAM: CT ABDOMEN AND PELVIS WITHOUT CONTRAST TECHNIQUE: Multidetector CT imaging of the abdomen and pelvis was performed following the standard protocol without IV contrast. RADIATION DOSE REDUCTION: This exam was performed according to the departmental dose-optimization program which includes automated exposure control, adjustment of the mA and/or kV according to patient size and/or use of iterative reconstruction technique. COMPARISON:  CT abdomen pelvis 02/13/2021 FINDINGS: Lower chest: No acute abnormality. Hepatobiliary: No focal liver abnormality. No gallstones, gallbladder wall thickening, or pericholecystic fluid. No biliary dilatation. Pancreas: No focal lesion. Normal pancreatic contour. No surrounding inflammatory changes. No main pancreatic ductal dilatation. Spleen: Normal in size without focal abnormality. Adrenals/Urinary Tract: No adrenal nodule bilaterally. Nonspecific right perinephric stranding. Right ureteral stent in grossly appropriate position with proximal tip overlying the renal pelvis and distal tip within the urinary bladder lumen adjacent to the Foley catheter balloon. No nephrolithiasis and no hydronephrosis. No definite contour-deforming renal mass. No ureterolithiasis or hydroureter. The urinary bladder is decompressed with a urinary Foley catheter tip and balloon terminating within its lumen. Stomach/Bowel: Sigmoid colon surgical changes. Stomach is within normal limits. No evidence of bowel wall thickening or dilatation. The appendix is not definitely identified with no inflammatory changes in the right lower quadrant to suggest acute appendicitis. Vascular/Lymphatic: No abdominal aorta or iliac aneurysm. Severe atherosclerotic plaque of the  aorta and its branches. No abdominal, pelvic, or inguinal lymphadenopathy. Reproductive: Status post hysterectomy. No adnexal masses. Other: No intraperitoneal free fluid. No intraperitoneal free gas. No organized fluid collection. Musculoskeletal: No abdominal wall hernia or abnormality. No suspicious lytic or blastic osseous lesions. No acute displaced fracture. Multilevel severe degenerative changes of the spine. T10-S1 thoracolumbar posterolateral fusion surgical hardware. Stable mild retrolisthesis of L3 on and on L5. Posterior disc osteophyte complex formation at the L2-L3, L3-L4, L4-L5, L5-S1 levels. Multilevel osseous neural foraminal and central canal stenosis. IMPRESSION: 1. Right ureteral stent in grossly appropriate position. Nonspecific asymmetric right perinephric stranding with markedly limited evaluation on this noncontrast study. Recommend correlation with urinalysis for superimposed infection. 2. Multilevel severe degenerative changes of the spine with associated T10 through S1 posterolateral fusion surgical hardware. Multilevel osseous neural foraminal and central canal stenosis. 3.  Aortic Atherosclerosis (ICD10-I70.0). Electronically Signed   By: Iven Finn M.D.   On: 03/09/2022 22:05   DG Chest 2 View  Result Date: 03/09/2022 CLINICAL DATA:  cough, concern for pna EXAM: CHEST - 2 VIEW COMPARISON:  Chest x-ray 02/16/2021 FINDINGS: The heart and mediastinal contours are unchanged. Aortic calcification. No focal consolidation. No pulmonary edema. No pleural effusion. No pneumothorax. No acute osseous abnormality. Thoracolumbar posterolateral surgical hardware partially visualized. IMPRESSION: 1. No active cardiopulmonary disease. 2.  Aortic Atherosclerosis (ICD10-I70.0). Electronically Signed   By: Iven Finn M.D.   On: 03/09/2022 21:15            LOS: 3 days    Time spent: 35 min    Emeterio Reeve, DO [facility] 03/12/2022, 3:56 PM   Staff may message me  via secure chat in [place]  but this may not receive immediate response,  please page for urgent matters!  If 7PM-7AM, please contact night-coverage [email or web address]  Dictation software was used to generate the above note. Typos may occur and escape review, as with typed/written notes. Please contact Dr Sheppard Coil directly for clarity if needed.

## 2022-03-13 DIAGNOSIS — N390 Urinary tract infection, site not specified: Secondary | ICD-10-CM | POA: Diagnosis not present

## 2022-03-13 DIAGNOSIS — A419 Sepsis, unspecified organism: Secondary | ICD-10-CM | POA: Diagnosis not present

## 2022-03-13 LAB — BASIC METABOLIC PANEL
Anion gap: 5 (ref 5–15)
BUN: 30 mg/dL — ABNORMAL HIGH (ref 8–23)
CO2: 22 mmol/L (ref 22–32)
Calcium: 8.7 mg/dL — ABNORMAL LOW (ref 8.9–10.3)
Chloride: 113 mmol/L — ABNORMAL HIGH (ref 98–111)
Creatinine, Ser: 1.23 mg/dL — ABNORMAL HIGH (ref 0.44–1.00)
GFR, Estimated: 45 mL/min — ABNORMAL LOW (ref >60)
Glucose, Bld: 165 mg/dL — ABNORMAL HIGH (ref 70–99)
Potassium: 4.2 mmol/L (ref 3.5–5.1)
Sodium: 140 mmol/L (ref 135–145)

## 2022-03-13 LAB — CBC
HCT: 27.9 % — ABNORMAL LOW (ref 36.0–46.0)
Hemoglobin: 8.8 g/dL — ABNORMAL LOW (ref 12.0–15.0)
MCH: 27.8 pg (ref 26.0–34.0)
MCHC: 31.5 g/dL (ref 30.0–36.0)
MCV: 88 fL (ref 80.0–100.0)
Platelets: 209 10*3/uL (ref 150–400)
RBC: 3.17 MIL/uL — ABNORMAL LOW (ref 3.87–5.11)
RDW: 15.9 % — ABNORMAL HIGH (ref 11.5–15.5)
WBC: 9.7 10*3/uL (ref 4.0–10.5)
nRBC: 0 % (ref 0.0–0.2)

## 2022-03-13 LAB — URINE CULTURE: Culture: >=100000 — AB

## 2022-03-13 LAB — GLUCOSE, CAPILLARY: Glucose-Capillary: 163 mg/dL — ABNORMAL HIGH (ref 70–99)

## 2022-03-13 MED ORDER — CIPROFLOXACIN HCL 500 MG PO TABS: 500.0000 mg | ORAL_TABLET | Freq: Two times a day (BID) | ORAL | 0 refills | Status: AC

## 2022-03-13 MED ORDER — DILTIAZEM HCL ER COATED BEADS 360 MG PO CP24: 360.0000 mg | ORAL_CAPSULE | Freq: Two times a day (BID) | ORAL | 0 refills | Status: DC

## 2022-03-13 NOTE — TOC Progression Note (Signed)
Transition of Care [facility]) - Progression Note    Patient Details  Name: BATOOL MAJID MRN: [id number] Date of Birth: 1940/11/16  [facility] CM/SW [place], RN Phone Number: 03/13/2022, 10:38 AM  Clinical Narrative:   Patient to discharge today.  [facility] confirmed HH acceptance per [place].  No DME requested by PT.         Expected Discharge Plan and Services           Expected Discharge Date: 03/13/22                         HH Arranged: PT HH Agency: [place] Date HH Agency Contacted: 03/11/22 Time [place]: [phone] Representative spoke with at [place]: [name] at [place] ([place]) Interventions    Readmission Risk Interventions    02/14/2021    1:28 PM 06/21/2020    1:54 PM 06/19/2020    2:21 PM  Readmission Risk Prevention Plan  Transportation Screening Complete Complete Complete  PCP or Specialist Appt within 3-5 Days Complete Complete   HRI or [place]  Complete   Social Work Consult for [place] Planning/Counseling Complete Complete   Palliative Care Screening Not Applicable Not Applicable   Medication Review Press photographer) Complete  Complete  PCP or Specialist appointment within 3-5 days of discharge   Complete  HRI or [place]   Complete  SW Recovery Care/Counseling Consult   Complete  [place]   Not Applicable

## 2022-03-13 NOTE — Discharge Summary (Signed)
Physician Discharge Summary   Patient: Stephanie [name] MRN: [id number]  DOB: 1941-09-27   Admit:     Date of Admission: 03/09/2022 Admitted from: home   Discharge: Date of discharge: 03/13/22 Disposition: Home health Condition at discharge: good  CODE STATUS: FULL   Diet recommendation: Cardiac and Carb modified diet   Discharge Physician: Emeterio Reeve, DO [facility]     PCP: Rusty Aus, MD  Recommendations for Outpatient Follow-up:  Follow up with PCP Rusty Aus, MD in 1-2 weeks Please obtain labs/tests: CBC, CBP, UA in 1-2 weeks Please follow up on the following pending results: none Please follow as directed w/ urology   Hospital Course:  [name] is a 81 y.o. female with medical history significant for Rheumatoid arthritis on chronic immunosuppressive therapy, DM, HTN, remote history of colon cancer and bladder cancer, CKD 3B,A-fib not on anticoagulation, right hydronephrosis with ureteral stent most recently exchanged on 5/9 and with chronic indwelling Foley exchanged on 6/6, and history of SBO who presents to the ED 03/09/22 with fever, chills and generalized malaise,associated nausea without vomiting and mild shortness of breath. Had a E. coli UTI a couple of weeks ago treated with Bactrim. 06/09: CT R ureteral stent, R perinephric stranding. UA(+). HR 100, TMax 102.63F, RR 24, WBC 15+ = sepsis. --> IV fluids, Cefepime, urology consulted 06/10: afebrile, HR and RR improved, husband reports mentation is much better. Lactic acid trending down. Urology saw patient: no procedures planned at this time, follow cultures and continue IV Abx  06/11: double checked and no UCx orders were in, called lab and confirmed add-on culture to 06/09 specimen. (Note: UCx 05/03 +Ecoli [R] ampicillin, cipro, levoflox, tetracycline ,tmp/smx [S] cefepime, ceftiraxone, nitrofurantoin, pip/tazo, others - based on this culture po cefdinir, cephalexin should be adequate).  Advised pt and family there was a delay w/ the culture and expressed apologies that this would delay discharge - given her improvement we discussed the options to 1) go home on po abx based on last UCx  though this would risk worsening, vs 2) stay here and wait for culture results - pt and husband opt for remaining inpatient.  06/12: doing well, maintaining IV Abx pending UCx results  06/13: UCx (+)Ecoli and Klebsiella both sensitive to cipro, discharged home to complete 7 days total abx      Consultants:  Urology Dr Bernardo Heater see consult note 06/10   Procedures: None       Discharge Diagnoses: Principal Problem:   Sepsis secondary to acute pyelonephritis(HCC) Active Problems:   Acute pyelonephritis   Hydronephrosis of right kidney   S/p exchange of ureteral stent 02/06/22   Indwelling Foley catheter present   DM type 2 with diabetic mixed hyperlipidemia (HCC)   Rheumatoid arthritis involving multiple sites with positive rheumatoid factor ([place])   Essential hypertension   Paroxysmal A-fib ([place])   Stage 3b chronic kidney disease ([place])   Normocytic anemia   Hypokalemia    Assessment & Plan:  Sepsis secondary to acute pyelonephritis(HCC) Sepsis criteria includes fever, tachycardia, leukocytosis with lactic acidosis Abnormal UA, recent E. coli UTI, recent stent exchange and Foley catheter exchange Cefepime Exchange Foley  Urology consulted - no procedures and no stent exchange needed, continue IV abx and follow cultures (see note 06/10) 06/11: double checked and no UCx orders were initially placed, called lab and confirmed add-on culture to 06/09 specimen. 06/13: UCx (+)Ecoli and Klebsiella both sensitive to cipro, discharged home to complete 7  days total abx   Hydronephrosis of right kidney Right ureteral stent s/p exchange on 02/06/2022 Indwelling Foley catheter s/p exchange on 03/06/2022, 03/09/2022 Urology consulted see note 06/10 and see A/P sepsis - no procedures at this time    Rheumatoid arthritis involving multiple sites with positive rheumatoid factor ([place]) On immunosuppressive medication, leflunomide so at risk for severe infection Resume home leflunomide   DM type 2 with diabetic mixed hyperlipidemia ([place]) Sliding scale insulin coverage inpatient Holding Glucotrol and metformin - can resume on d/c but hold glipizide until done w/ cipro Continue Crestor  Essential hypertension Slightly elevated. Improved this AM Increased home diltiazem dose   Paroxysmal A-fib (HCC) Continue metoprolol and diltiazem.   Not on anticoagulation  Stage 3b chronic kidney disease ([place]) Renal function at baseline on admission Follow BMP outpatient  Normocytic anemia At baseline.  Continue to monitor  S/p exchange of ureteral stent 02/06/22 Urology consulted see note 06/10 and see A/P sepsis  Hypokalemia Repleted, monitor      Discharge Instructions  Discharge Instructions     Diet - low sodium heart healthy   Complete by: As directed    Diet Carb Modified   Complete by: As directed    Discharge instructions   Complete by: As directed    Medication changes: Antibiotics )ciprofloxacin) to start today, take twice per day for total 3 days HOLD your glipizide until at least 24 hours after last dose antibiotics  INcreased your diltiazem to 260 mg once daily, new Rx at the pharmacy  Follow up: W/ PCP in 1 week to monitor BP and labs  W/ urology as directed   Increase activity slowly   Complete by: As directed        Allergies as of 03/13/2022       Reactions   Infliximab Hives, Shortness Of Breath, Itching   Ibuprofen Itching   Indomethacin Other (See Comments)   headache   Methotrexate Derivatives Other (See Comments)   Headache   Moexipril Other (See Comments)   unknown   Percocet [oxycodone-acetaminophen] Itching   Naprosyn [naproxen] Rash        Medication List     STOP taking these medications    cefdinir 300 MG capsule Commonly  known as: OMNICEF   mirabegron ER 25 MG Tb24 tablet Commonly known as: MYRBETRIQ       TAKE these medications    acetaminophen 325 MG tablet Commonly known as: TYLENOL Take 2 tablets (650 mg total) by mouth every 6 (six) hours as needed for moderate pain, fever or headache.   ascorbic acid 500 MG tablet Commonly known as: VITAMIN C Take 1 tablet (500 mg total) by mouth daily.   brimonidine 0.2 % ophthalmic solution Commonly known as: ALPHAGAN Place 1 drop into both eyes 2 (two) times daily.   ciprofloxacin 500 MG tablet Commonly known as: CIPRO Take 1 tablet (500 mg total) by mouth 2 (two) times daily for 3 days.   Contour Test test strip Generic drug: glucose blood 1 each by Other route daily.   Cranberry 400 MG Caps Take 400 mg by mouth daily.   diltiazem 360 MG 24 hr capsule Commonly known as: CARDIZEM CD Take 1 capsule (360 mg total) by mouth in the morning and at bedtime. What changed:  medication strength how much to take   glipiZIDE 10 MG tablet Commonly known as: GLUCOTROL Take 5 mg by mouth 2 (two) times daily before a meal.   leflunomide 10 MG tablet Commonly  known as: ARAVA Take 10 mg by mouth every other day.   LIQUID TEARS OP Place 1 drop into both eyes daily as needed (Dry eyes).   Magnesium Oxide 250 MG Tabs Take 250 mg by mouth daily.   metFORMIN 500 MG 24 hr tablet Commonly known as: GLUCOPHAGE-XR Take 500 mg by mouth every evening.   metoprolol tartrate 50 MG tablet Commonly known as: LOPRESSOR Take 50 mg by mouth 2 (two) times daily.   omeprazole 40 MG capsule Commonly known as: PRILOSEC Take 40 mg by mouth daily.   PROBIOTIC-10 PO Take 1 capsule by mouth daily.   rosuvastatin 10 MG tablet Commonly known as: CRESTOR Take 10 mg by mouth daily.   tacrolimus 0.1 % ointment Commonly known as: PROTOPIC Apply 1 application. topically daily as needed (irritation).   triamcinolone 0.025 % ointment Commonly known as:  KENALOG Apply 1 application. topically 2 (two) times daily. What changed:  when to take this reasons to take this   vitamin B-12 1000 MCG tablet Commonly known as: CYANOCOBALAMIN Take 1,000 mcg by mouth daily.   Vitron-C 65-125 MG Tabs Generic drug: Iron-Vitamin C Take 1 tablet by mouth daily.        Diet Orders (From admission, onward)     Start     Ordered   03/13/22 0000  Diet - low sodium heart healthy        03/13/22 1026   03/13/22 0000  Diet Carb Modified        03/13/22 1026               Allergies  Allergen Reactions   Infliximab Hives, Shortness Of Breath and Itching   Ibuprofen Itching   Indomethacin Other (See Comments)    headache   Methotrexate Derivatives Other (See Comments)    Headache   Moexipril Other (See Comments)    unknown   Percocet [Oxycodone-Acetaminophen] Itching   Naprosyn [Naproxen] Rash     Subjective: feeling good today, walking around a lot yesterday, ready to go home    Discharge Exam: Vitals:   03/13/22 0359 03/13/22 0700  BP: (!) 155/55 (!) 178/86  Pulse: 63 69  Resp: 18 16  Temp: 98.8 F (37.1 C) 98.1 F (36.7 C)  SpO2: 95% 99%   Vitals:   03/12/22 1948 03/12/22 2000 03/13/22 0359 03/13/22 0700  BP:  (!) 173/71 (!) 155/55 (!) 178/86  Pulse:  72 63 69  Resp:  '18 18 16  '$ Temp:  98.7 F (37.1 C) 98.8 F (37.1 C) 98.1 F (36.7 C)  TempSrc:  Oral Oral Oral  SpO2:  97% 95% 99%  Weight: 72.3 kg     Height: '5\' 6"'$  (1.676 m)        General: Pt is alert, awake, not in acute distress Cardiovascular: RRR, S1/S2 +, no rubs, no gallops Respiratory: CTA bilaterally, no wheezing, no rhonchi Abdominal: Soft, NT, ND, bowel sounds + Extremities: no edema, no cyanosis     The results of significant diagnostics from this hospitalization (including imaging, microbiology, ancillary and laboratory) are listed below for reference.     Microbiology: Recent Results (from the past 240 hour(s))  Resp Panel by RT-PCR  (Flu A&B, Covid) Anterior Nasal Swab     Status: None   Collection Time: 03/09/22  8:33 PM   Specimen: Anterior Nasal Swab  Result Value Ref Range Status   SARS Coronavirus 2 by RT PCR NEGATIVE NEGATIVE Final    Comment: (NOTE) SARS-CoV-2 target nucleic acids  are NOT DETECTED.  The SARS-CoV-2 RNA is generally detectable in upper respiratory specimens during the acute phase of infection. The lowest concentration of SARS-CoV-2 viral copies this assay can detect is 138 copies/mL. A negative result does not preclude SARS-Cov-2 infection and should not be used as the sole basis for treatment or other patient management decisions. A negative result may occur with  improper specimen collection/handling, submission of specimen other than nasopharyngeal swab, presence of viral mutation(s) within the areas targeted by this assay, and inadequate number of viral copies(<138 copies/mL). A negative result must be combined with clinical observations, patient history, and epidemiological information. The expected result is Negative.  Fact Sheet for Patients:  EntrepreneurPulse.com.au  Fact Sheet for Healthcare Providers:  IncredibleEmployment.be  This test is no t yet approved or cleared by the [place] FDA and  has been authorized for detection and/or diagnosis of SARS-CoV-2 by FDA under an Emergency Use Authorization (EUA). This EUA will remain  in effect (meaning this test can be used) for the duration of the COVID-19 declaration under Section 564(b)(1) of the Act, 21 U.S.C.section 360bbb-3(b)(1), unless the authorization is terminated  or revoked sooner.       Influenza A by PCR NEGATIVE NEGATIVE Final   Influenza B by PCR NEGATIVE NEGATIVE Final    Comment: (NOTE) The Xpert Xpress SARS-CoV-2/FLU/RSV plus assay is intended as an aid in the diagnosis of influenza from Nasopharyngeal swab specimens and should not be used as a sole basis for  treatment. Nasal washings and aspirates are unacceptable for Xpert Xpress SARS-CoV-2/FLU/RSV testing.  Fact Sheet for Patients: EntrepreneurPulse.com.au  Fact Sheet for Healthcare Providers: IncredibleEmployment.be  This test is not yet approved or cleared by the [place] FDA and has been authorized for detection and/or diagnosis of SARS-CoV-2 by FDA under an Emergency Use Authorization (EUA). This EUA will remain in effect (meaning this test can be used) for the duration of the COVID-19 declaration under Section 564(b)(1) of the Act, 21 U.S.C. section 360bbb-3(b)(1), unless the authorization is terminated or revoked.  Performed at [facility], [address] [place], [place] [address]   Blood culture (routine x 2)     Status: None (Preliminary result)   Collection Time: 03/09/22  8:33 PM   Specimen: BLOOD  Result Value Ref Range Status   Specimen Description BLOOD RIGHT ANTECUBITAL  Final   Special Requests   Final    BOTTLES DRAWN AEROBIC AND ANAEROBIC Blood Culture adequate volume   Culture   Final    NO GROWTH 4 DAYS Performed at [facility], [address] [place], [place] [address]    Report Status PENDING  Incomplete  Blood culture (routine x 2)     Status: None (Preliminary result)   Collection Time: 03/09/22  8:33 PM   Specimen: BLOOD  Result Value Ref Range Status   Specimen Description BLOOD RIGHT ANTECUBITAL  Final   Special Requests   Final    BOTTLES DRAWN AEROBIC AND ANAEROBIC Blood Culture results may not be optimal due to an inadequate volume of blood received in culture bottles   Culture   Final    NO GROWTH 4 DAYS Performed at [facility], [address] [place], [place] [address]    Report Status PENDING  Incomplete  Urine Culture     Status: Abnormal   Collection Time: 03/09/22  8:41 PM   Specimen: Urine, Random  Result Value Ref Range Status   Specimen Description   Final  URINE, RANDOM Performed at [facility], [address] [place], [place] [address]    Special Requests   Final    NONE Performed at [facility], [address] [place], [place] [address]    Culture (A)  Final    >=100,000 COLONIES/mL ESCHERICHIA COLI 80,000 COLONIES/mL KLEBSIELLA PNEUMONIAE Confirmed Extended Spectrum Beta-Lactamase Producer (ESBL).  In bloodstream infections from ESBL organisms, carbapenems are preferred over piperacillin/tazobactam. They are shown to have a lower risk of mortality.    Report Status 03/13/2022 FINAL  Final   Organism ID, Bacteria ESCHERICHIA COLI (A)  Final   Organism ID, Bacteria KLEBSIELLA PNEUMONIAE (A)  Final      Susceptibility   Escherichia coli - MIC*    AMPICILLIN >=32 RESISTANT Resistant     CEFAZOLIN >=64 RESISTANT Resistant     CEFEPIME <=0.12 SENSITIVE Sensitive     CEFTRIAXONE <=0.25 SENSITIVE Sensitive     CIPROFLOXACIN <=0.25 SENSITIVE Sensitive     GENTAMICIN <=1 SENSITIVE Sensitive     IMIPENEM <=0.25 SENSITIVE Sensitive     NITROFURANTOIN <=16 SENSITIVE Sensitive     TRIMETH/SULFA <=20 SENSITIVE Sensitive     AMPICILLIN/SULBACTAM >=32 RESISTANT Resistant     PIP/TAZO <=4 SENSITIVE Sensitive     * >=100,000 COLONIES/mL ESCHERICHIA COLI   Klebsiella pneumoniae - MIC*    AMPICILLIN >=32 RESISTANT Resistant     CEFAZOLIN >=64 RESISTANT Resistant     CEFEPIME 2 SENSITIVE Sensitive     CEFTRIAXONE >=64 RESISTANT Resistant     CIPROFLOXACIN <=0.25 SENSITIVE Sensitive     GENTAMICIN >=16 RESISTANT Resistant     IMIPENEM <=0.25 SENSITIVE Sensitive     NITROFURANTOIN 128 RESISTANT Resistant     TRIMETH/SULFA >=320 RESISTANT Resistant     AMPICILLIN/SULBACTAM >=32 RESISTANT Resistant     PIP/TAZO <=4 SENSITIVE Sensitive     * 80,000 COLONIES/mL KLEBSIELLA PNEUMONIAE     Labs: BNP (last 3 results) No results for input(s): "BNP" in the last 8760 hours. Basic Metabolic Panel: Recent Labs  Lab  03/09/22 2041 03/10/22 1216 03/11/22 0610 03/12/22 0615 03/13/22 0537  NA 138 138 139 139 140  K 4.2 4.0 3.3* 3.7 4.2  CL 110 111 111 111 113*  CO2 21* '22 22 22 22  '$ GLUCOSE 184* 142* 128* 175* 165*  BUN 29* 26* 24* 31* 30*  CREATININE 1.64* 1.47* 1.29* 1.40* 1.23*  CALCIUM 8.7* 8.8* 9.0 8.7* 8.7*   Liver Function Tests: Recent Labs  Lab 03/09/22 2041  AST 22  ALT 15  ALKPHOS 97  BILITOT 0.8  PROT 6.2*  ALBUMIN 3.3*   Recent Labs  Lab 03/09/22 2041  LIPASE 28   No results for input(s): "AMMONIA" in the last 168 hours. CBC: Recent Labs  Lab 03/09/22 2041 03/10/22 1216 03/11/22 0610 03/12/22 0615 03/13/22 0537  WBC 15.6* 17.7* 11.7* 8.6 9.7  HGB 9.8* 9.2* 9.4* 8.8* 8.8*  HCT 32.2* 29.8* 30.4* 27.9* 27.9*  MCV 89.9 89.2 87.4 87.7 88.0  PLT 219 204 207 188 209   Cardiac Enzymes: No results for input(s): "CKTOTAL", "CKMB", "CKMBINDEX", "TROPONINI" in the last 168 hours. BNP: Invalid input(s): "POCBNP" CBG: Recent Labs  Lab 03/12/22 0819 03/12/22 1138 03/12/22 1710 03/12/22 2109 03/13/22 0733  GLUCAP 181* 307* 178* 182* 163*   D-Dimer No results for input(s): "DDIMER" in the last 72 hours. Hgb A1c No results for input(s): "HGBA1C" in the last 72 hours. Lipid Profile No results for input(s): "CHOL", "HDL", "LDLCALC", "TRIG", "CHOLHDL", "LDLDIRECT" in the  last 72 hours. Thyroid function studies No results for input(s): "TSH", "T4TOTAL", "T3FREE", "THYROIDAB" in the last 72 hours.  Invalid input(s): "FREET3" Anemia work up No results for input(s): "VITAMINB12", "FOLATE", "FERRITIN", "TIBC", "IRON", "RETICCTPCT" in the last 72 hours. Urinalysis    Component Value Date/Time   COLORURINE YELLOW (A) 03/09/2022 2041   APPEARANCEUR CLOUDY (A) 03/09/2022 2041   APPEARANCEUR Cloudy (A) 01/31/2022 1302   LABSPEC 1.010 03/09/2022 2041   LABSPEC 1.013 01/04/2015 1239   PHURINE 6.0 03/09/2022 2041   GLUCOSEU NEGATIVE 03/09/2022 2041   GLUCOSEU Negative  01/04/2015 1239   HGBUR NEGATIVE 03/09/2022 2041   BILIRUBINUR NEGATIVE 03/09/2022 2041   BILIRUBINUR Negative 01/31/2022 1302   BILIRUBINUR Negative 01/04/2015 1239   KETONESUR NEGATIVE 03/09/2022 2041   PROTEINUR >=300 (A) 03/09/2022 2041   NITRITE NEGATIVE 03/09/2022 2041   LEUKOCYTESUR LARGE (A) 03/09/2022 2041   LEUKOCYTESUR 2+ 01/04/2015 1239   Sepsis Labs Recent Labs  Lab 03/10/22 1216 03/11/22 0610 03/12/22 0615 03/13/22 0537  WBC 17.7* 11.7* 8.6 9.7   Microbiology Recent Results (from the past 240 hour(s))  Resp Panel by RT-PCR (Flu A&B, Covid) Anterior Nasal Swab     Status: None   Collection Time: 03/09/22  8:33 PM   Specimen: Anterior Nasal Swab  Result Value Ref Range Status   SARS Coronavirus 2 by RT PCR NEGATIVE NEGATIVE Final    Comment: (NOTE) SARS-CoV-2 target nucleic acids are NOT DETECTED.  The SARS-CoV-2 RNA is generally detectable in upper respiratory specimens during the acute phase of infection. The lowest concentration of SARS-CoV-2 viral copies this assay can detect is 138 copies/mL. A negative result does not preclude SARS-Cov-2 infection and should not be used as the sole basis for treatment or other patient management decisions. A negative result may occur with  improper specimen collection/handling, submission of specimen other than nasopharyngeal swab, presence of viral mutation(s) within the areas targeted by this assay, and inadequate number of viral copies(<138 copies/mL). A negative result must be combined with clinical observations, patient history, and epidemiological information. The expected result is Negative.  Fact Sheet for Patients:  EntrepreneurPulse.com.au  Fact Sheet for Healthcare Providers:  IncredibleEmployment.be  This test is no t yet approved or cleared by the [place] FDA and  has been authorized for detection and/or diagnosis of SARS-CoV-2 by FDA under an Emergency Use  Authorization (EUA). This EUA will remain  in effect (meaning this test can be used) for the duration of the COVID-19 declaration under Section 564(b)(1) of the Act, 21 U.S.C.section 360bbb-3(b)(1), unless the authorization is terminated  or revoked sooner.       Influenza A by PCR NEGATIVE NEGATIVE Final   Influenza B by PCR NEGATIVE NEGATIVE Final    Comment: (NOTE) The Xpert Xpress SARS-CoV-2/FLU/RSV plus assay is intended as an aid in the diagnosis of influenza from Nasopharyngeal swab specimens and should not be used as a sole basis for treatment. Nasal washings and aspirates are unacceptable for Xpert Xpress SARS-CoV-2/FLU/RSV testing.  Fact Sheet for Patients: EntrepreneurPulse.com.au  Fact Sheet for Healthcare Providers: IncredibleEmployment.be  This test is not yet approved or cleared by the [place] FDA and has been authorized for detection and/or diagnosis of SARS-CoV-2 by FDA under an Emergency Use Authorization (EUA). This EUA will remain in effect (meaning this test can be used) for the duration of the COVID-19 declaration under Section 564(b)(1) of the Act, 21 U.S.C. section 360bbb-3(b)(1), unless the authorization is terminated or revoked.  Performed at [facility], [address] [place], [place] [address]   Blood culture (routine x 2)     Status: None (Preliminary result)   Collection Time: 03/09/22  8:33 PM   Specimen: BLOOD  Result Value Ref Range Status   Specimen Description BLOOD RIGHT ANTECUBITAL  Final   Special Requests   Final    BOTTLES DRAWN AEROBIC AND ANAEROBIC Blood Culture adequate volume   Culture   Final    NO GROWTH 4 DAYS Performed at [facility], [address], [place], [place] [address]    Report Status PENDING  Incomplete  Blood culture (routine x 2)     Status: None (Preliminary result)   Collection Time: 03/09/22  8:33 PM   Specimen: BLOOD  Result Value Ref Range  Status   Specimen Description BLOOD RIGHT ANTECUBITAL  Final   Special Requests   Final    BOTTLES DRAWN AEROBIC AND ANAEROBIC Blood Culture results may not be optimal due to an inadequate volume of blood received in culture bottles   Culture   Final    NO GROWTH 4 DAYS Performed at [facility], [address] [place], [place] [address]    Report Status PENDING  Incomplete  Urine Culture     Status: Abnormal   Collection Time: 03/09/22  8:41 PM   Specimen: Urine, Random  Result Value Ref Range Status   Specimen Description   Final    URINE, RANDOM Performed at [facility], [address] [place], [place] [address]    Special Requests   Final    NONE Performed at [facility], [address] [place], [place] [address]    Culture (A)  Final    >=100,000 COLONIES/mL ESCHERICHIA COLI 80,000 COLONIES/mL KLEBSIELLA PNEUMONIAE Confirmed Extended Spectrum Beta-Lactamase Producer (ESBL).  In bloodstream infections from ESBL organisms, carbapenems are preferred over piperacillin/tazobactam. They are shown to have a lower risk of mortality.    Report Status 03/13/2022 FINAL  Final   Organism ID, Bacteria ESCHERICHIA COLI (A)  Final   Organism ID, Bacteria KLEBSIELLA PNEUMONIAE (A)  Final      Susceptibility   Escherichia coli - MIC*    AMPICILLIN >=32 RESISTANT Resistant     CEFAZOLIN >=64 RESISTANT Resistant     CEFEPIME <=0.12 SENSITIVE Sensitive     CEFTRIAXONE <=0.25 SENSITIVE Sensitive     CIPROFLOXACIN <=0.25 SENSITIVE Sensitive     GENTAMICIN <=1 SENSITIVE Sensitive     IMIPENEM <=0.25 SENSITIVE Sensitive     NITROFURANTOIN <=16 SENSITIVE Sensitive     TRIMETH/SULFA <=20 SENSITIVE Sensitive     AMPICILLIN/SULBACTAM >=32 RESISTANT Resistant     PIP/TAZO <=4 SENSITIVE Sensitive     * >=100,000 COLONIES/mL ESCHERICHIA COLI   Klebsiella pneumoniae - MIC*    AMPICILLIN >=32 RESISTANT Resistant     CEFAZOLIN >=64 RESISTANT Resistant     CEFEPIME  2 SENSITIVE Sensitive     CEFTRIAXONE >=64 RESISTANT Resistant     CIPROFLOXACIN <=0.25 SENSITIVE Sensitive     GENTAMICIN >=16 RESISTANT Resistant     IMIPENEM <=0.25 SENSITIVE Sensitive     NITROFURANTOIN 128 RESISTANT Resistant     TRIMETH/SULFA >=320 RESISTANT Resistant     AMPICILLIN/SULBACTAM >=32 RESISTANT Resistant     PIP/TAZO <=4 SENSITIVE Sensitive     * 80,000 COLONIES/mL KLEBSIELLA PNEUMONIAE   Imaging CT Renal Stone Study  Result Date: 03/09/2022 CLINICAL DATA:  Flank pain, kidney stone suspected EXAM: CT ABDOMEN AND PELVIS WITHOUT CONTRAST TECHNIQUE: Multidetector  CT imaging of the abdomen and pelvis was performed following the standard protocol without IV contrast. RADIATION DOSE REDUCTION: This exam was performed according to the departmental dose-optimization program which includes automated exposure control, adjustment of the mA and/or kV according to patient size and/or use of iterative reconstruction technique. COMPARISON:  CT abdomen pelvis 02/13/2021 FINDINGS: Lower chest: No acute abnormality. Hepatobiliary: No focal liver abnormality. No gallstones, gallbladder wall thickening, or pericholecystic fluid. No biliary dilatation. Pancreas: No focal lesion. Normal pancreatic contour. No surrounding inflammatory changes. No main pancreatic ductal dilatation. Spleen: Normal in size without focal abnormality. Adrenals/Urinary Tract: No adrenal nodule bilaterally. Nonspecific right perinephric stranding. Right ureteral stent in grossly appropriate position with proximal tip overlying the renal pelvis and distal tip within the urinary bladder lumen adjacent to the Foley catheter balloon. No nephrolithiasis and no hydronephrosis. No definite contour-deforming renal mass. No ureterolithiasis or hydroureter. The urinary bladder is decompressed with a urinary Foley catheter tip and balloon terminating within its lumen. Stomach/Bowel: Sigmoid colon surgical changes. Stomach is within normal  limits. No evidence of bowel wall thickening or dilatation. The appendix is not definitely identified with no inflammatory changes in the right lower quadrant to suggest acute appendicitis. Vascular/Lymphatic: No abdominal aorta or iliac aneurysm. Severe atherosclerotic plaque of the aorta and its branches. No abdominal, pelvic, or inguinal lymphadenopathy. Reproductive: Status post hysterectomy. No adnexal masses. Other: No intraperitoneal free fluid. No intraperitoneal free gas. No organized fluid collection. Musculoskeletal: No abdominal wall hernia or abnormality. No suspicious lytic or blastic osseous lesions. No acute displaced fracture. Multilevel severe degenerative changes of the spine. T10-S1 thoracolumbar posterolateral fusion surgical hardware. Stable mild retrolisthesis of L3 on and on L5. Posterior disc osteophyte complex formation at the L2-L3, L3-L4, L4-L5, L5-S1 levels. Multilevel osseous neural foraminal and central canal stenosis. IMPRESSION: 1. Right ureteral stent in grossly appropriate position. Nonspecific asymmetric right perinephric stranding with markedly limited evaluation on this noncontrast study. Recommend correlation with urinalysis for superimposed infection. 2. Multilevel severe degenerative changes of the spine with associated T10 through S1 posterolateral fusion surgical hardware. Multilevel osseous neural foraminal and central canal stenosis. 3.  Aortic Atherosclerosis (ICD10-I70.0). Electronically Signed   By: Iven Finn M.D.   On: 03/09/2022 22:05   DG Chest 2 View  Result Date: 03/09/2022 CLINICAL DATA:  cough, concern for pna EXAM: CHEST - 2 VIEW COMPARISON:  Chest x-ray 02/16/2021 FINDINGS: The heart and mediastinal contours are unchanged. Aortic calcification. No focal consolidation. No pulmonary edema. No pleural effusion. No pneumothorax. No acute osseous abnormality. Thoracolumbar posterolateral surgical hardware partially visualized. IMPRESSION: 1. No active  cardiopulmonary disease. 2.  Aortic Atherosclerosis (ICD10-I70.0). Electronically Signed   By: Iven Finn M.D.   On: 03/09/2022 21:15      Time coordinating discharge: Over 30 minutes  SIGNED:  Emeterio Reeve DO [facility]

## 2022-03-14 LAB — CULTURE, BLOOD (ROUTINE X 2)
Culture: NO GROWTH
Culture: NO GROWTH
Special Requests: ADEQUATE

## 2022-03-26 ENCOUNTER — Other Ambulatory Visit: Payer: Self-pay

## 2022-03-29 ENCOUNTER — Ambulatory Visit
Admission: RE | Admit: 2022-03-29 | Discharge: 2022-03-29 | Disposition: A | Discharge status: Home / Self Care | Payer: Medicare Other | Source: Ambulatory Visit | Attending: Internal Medicine | Admitting: Internal Medicine

## 2022-03-29 DIAGNOSIS — Z1231 Encounter for screening mammogram for malignant neoplasm of breast: Secondary | ICD-10-CM | POA: Diagnosis present

## 2022-04-02 NOTE — Progress Notes (Unsigned)
Cath Change/ Replacement  Patient is present today for a catheter change due to urinary retention.  9 ml of water was removed from the balloon, a 16 FR foley cath was removed with out difficulty.  Patient was cleaned and prepped in a sterile fashion with betadine. A 16 FR foley cath was replaced into the bladder no complications were noted Urine return was noted 10 ml and urine was yellow in color. The balloon was filled with 10ml of sterile water. A leg bag was attached for drainage.  A night bag was also given to the patient and patient was given instruction on how to change from one bag to another. Patient was given proper instruction on catheter care.    Performed by: Michiel Cowboy, PA-C   Follow up: One month for Foley exchange

## 2022-04-04 ENCOUNTER — Ambulatory Visit (INDEPENDENT_AMBULATORY_CARE_PROVIDER_SITE_OTHER): Payer: Medicare Other | Admitting: Urology

## 2022-04-04 DIAGNOSIS — R339 Retention of urine, unspecified: Secondary | ICD-10-CM

## 2022-04-04 DIAGNOSIS — Z978 Presence of other specified devices: Secondary | ICD-10-CM

## 2022-04-28 ENCOUNTER — Other Ambulatory Visit: Payer: Self-pay

## 2022-04-28 ENCOUNTER — Emergency Department: Payer: Medicare Other

## 2022-04-28 ENCOUNTER — Inpatient Hospital Stay
Admission: EM | Admit: 2022-04-28 | Discharge: 2022-05-02 | DRG: 690 | Disposition: A | Discharge status: Home / Self Care | Payer: Medicare Other | Attending: Internal Medicine | Admitting: Internal Medicine

## 2022-04-28 DIAGNOSIS — Z20822 Contact with and (suspected) exposure to covid-19: Secondary | ICD-10-CM | POA: Diagnosis present

## 2022-04-28 DIAGNOSIS — I480 Paroxysmal atrial fibrillation: Secondary | ICD-10-CM | POA: Diagnosis present

## 2022-04-28 DIAGNOSIS — E538 Deficiency of other specified B group vitamins: Secondary | ICD-10-CM | POA: Diagnosis present

## 2022-04-28 DIAGNOSIS — Z974 Presence of external hearing-aid: Secondary | ICD-10-CM

## 2022-04-28 DIAGNOSIS — Z885 Allergy status to narcotic agent status: Secondary | ICD-10-CM

## 2022-04-28 DIAGNOSIS — N136 Pyonephrosis: Principal | ICD-10-CM | POA: Diagnosis present

## 2022-04-28 DIAGNOSIS — L89159 Pressure ulcer of sacral region, unspecified stage: Secondary | ICD-10-CM | POA: Diagnosis present

## 2022-04-28 DIAGNOSIS — M069 Rheumatoid arthritis, unspecified: Secondary | ICD-10-CM | POA: Diagnosis present

## 2022-04-28 DIAGNOSIS — Z888 Allergy status to other drugs, medicaments and biological substances status: Secondary | ICD-10-CM | POA: Diagnosis not present

## 2022-04-28 DIAGNOSIS — K219 Gastro-esophageal reflux disease without esophagitis: Secondary | ICD-10-CM | POA: Diagnosis present

## 2022-04-28 DIAGNOSIS — H409 Unspecified glaucoma: Secondary | ICD-10-CM | POA: Diagnosis present

## 2022-04-28 DIAGNOSIS — N281 Cyst of kidney, acquired: Secondary | ICD-10-CM

## 2022-04-28 DIAGNOSIS — I4510 Unspecified right bundle-branch block: Secondary | ICD-10-CM | POA: Diagnosis present

## 2022-04-28 DIAGNOSIS — N189 Chronic kidney disease, unspecified: Secondary | ICD-10-CM | POA: Diagnosis not present

## 2022-04-28 DIAGNOSIS — G8929 Other chronic pain: Secondary | ICD-10-CM | POA: Diagnosis present

## 2022-04-28 DIAGNOSIS — D649 Anemia, unspecified: Secondary | ICD-10-CM | POA: Diagnosis present

## 2022-04-28 DIAGNOSIS — Z7984 Long term (current) use of oral hypoglycemic drugs: Secondary | ICD-10-CM

## 2022-04-28 DIAGNOSIS — L89151 Pressure ulcer of sacral region, stage 1: Secondary | ICD-10-CM | POA: Diagnosis present

## 2022-04-28 DIAGNOSIS — N139 Obstructive and reflux uropathy, unspecified: Secondary | ICD-10-CM | POA: Diagnosis not present

## 2022-04-28 DIAGNOSIS — E785 Hyperlipidemia, unspecified: Secondary | ICD-10-CM | POA: Diagnosis present

## 2022-04-28 DIAGNOSIS — N151 Renal and perinephric abscess: Secondary | ICD-10-CM

## 2022-04-28 DIAGNOSIS — Z8551 Personal history of malignant neoplasm of bladder: Secondary | ICD-10-CM

## 2022-04-28 DIAGNOSIS — N179 Acute kidney failure, unspecified: Secondary | ICD-10-CM | POA: Diagnosis not present

## 2022-04-28 DIAGNOSIS — I160 Hypertensive urgency: Secondary | ICD-10-CM | POA: Diagnosis present

## 2022-04-28 DIAGNOSIS — I129 Hypertensive chronic kidney disease with stage 1 through stage 4 chronic kidney disease, or unspecified chronic kidney disease: Secondary | ICD-10-CM | POA: Diagnosis present

## 2022-04-28 DIAGNOSIS — N1832 Chronic kidney disease, stage 3b: Secondary | ICD-10-CM | POA: Diagnosis present

## 2022-04-28 DIAGNOSIS — Z79899 Other long term (current) drug therapy: Secondary | ICD-10-CM | POA: Diagnosis not present

## 2022-04-28 DIAGNOSIS — N390 Urinary tract infection, site not specified: Secondary | ICD-10-CM | POA: Diagnosis not present

## 2022-04-28 DIAGNOSIS — R509 Fever, unspecified: Principal | ICD-10-CM

## 2022-04-28 DIAGNOSIS — Z960 Presence of urogenital implants: Secondary | ICD-10-CM | POA: Diagnosis present

## 2022-04-28 DIAGNOSIS — E1129 Type 2 diabetes mellitus with other diabetic kidney complication: Secondary | ICD-10-CM | POA: Diagnosis present

## 2022-04-28 DIAGNOSIS — H9190 Unspecified hearing loss, unspecified ear: Secondary | ICD-10-CM | POA: Diagnosis present

## 2022-04-28 DIAGNOSIS — Z96653 Presence of artificial knee joint, bilateral: Secondary | ICD-10-CM | POA: Diagnosis present

## 2022-04-28 LAB — URINALYSIS, ROUTINE W REFLEX MICROSCOPIC
Bilirubin Urine: NEGATIVE
Glucose, UA: NEGATIVE mg/dL
Hgb urine dipstick: NEGATIVE
Ketones, ur: NEGATIVE mg/dL
Nitrite: NEGATIVE
Protein, ur: >=300 mg/dL — AB
Specific Gravity, Urine: 1.018 (ref 1.005–1.030)
WBC, UA: >50 WBC/hpf — ABNORMAL HIGH (ref 0–5)
pH: 5 (ref 5.0–8.0)

## 2022-04-28 LAB — COMPREHENSIVE METABOLIC PANEL
ALT: 17 U/L (ref 0–44)
AST: 23 U/L (ref 15–41)
Albumin: 3 g/dL — ABNORMAL LOW (ref 3.5–5.0)
Alkaline Phosphatase: 153 U/L — ABNORMAL HIGH (ref 38–126)
Anion gap: 6 (ref 5–15)
BUN: 24 mg/dL — ABNORMAL HIGH (ref 8–23)
CO2: 19 mmol/L — ABNORMAL LOW (ref 22–32)
Calcium: 8.9 mg/dL (ref 8.9–10.3)
Chloride: 110 mmol/L (ref 98–111)
Creatinine, Ser: 1.48 mg/dL — ABNORMAL HIGH (ref 0.44–1.00)
GFR, Estimated: 36 mL/min — ABNORMAL LOW (ref >60)
Glucose, Bld: 157 mg/dL — ABNORMAL HIGH (ref 70–99)
Potassium: 4.1 mmol/L (ref 3.5–5.1)
Sodium: 135 mmol/L (ref 135–145)
Total Bilirubin: 0.5 mg/dL (ref 0.3–1.2)
Total Protein: 6.8 g/dL (ref 6.5–8.1)

## 2022-04-28 LAB — CBC
HCT: 31.7 % — ABNORMAL LOW (ref 36.0–46.0)
Hemoglobin: 9.9 g/dL — ABNORMAL LOW (ref 12.0–15.0)
MCH: 26.8 pg (ref 26.0–34.0)
MCHC: 31.2 g/dL (ref 30.0–36.0)
MCV: 85.7 fL (ref 80.0–100.0)
Platelets: 310 10*3/uL (ref 150–400)
RBC: 3.7 MIL/uL — ABNORMAL LOW (ref 3.87–5.11)
RDW: 14.4 % (ref 11.5–15.5)
WBC: 17.3 10*3/uL — ABNORMAL HIGH (ref 4.0–10.5)
nRBC: 0 % (ref 0.0–0.2)

## 2022-04-28 LAB — LACTIC ACID, PLASMA
Lactic Acid, Venous: 1.1 mmol/L (ref 0.5–1.9)
Lactic Acid, Venous: 1.1 mmol/L (ref 0.5–1.9)

## 2022-04-28 LAB — LIPASE, BLOOD: Lipase: 25 U/L (ref 11–51)

## 2022-04-28 LAB — SARS CORONAVIRUS 2 BY RT PCR: SARS Coronavirus 2 by RT PCR: NEGATIVE

## 2022-04-28 LAB — PROCALCITONIN: Procalcitonin: 0.31 ng/mL

## 2022-04-28 MED ORDER — ACETAMINOPHEN 650 MG RE SUPP: 650.0000 mg | Freq: Four times a day (QID) | RECTAL | Status: DC | PRN

## 2022-04-28 MED ORDER — GLUCOSE BLOOD VI STRP
1.0000 | ORAL_STRIP | Freq: Every day | Status: DC
Start: 2022-04-28 — End: 2022-04-28

## 2022-04-28 MED ORDER — PANTOPRAZOLE SODIUM 40 MG PO TBEC
40.0000 mg | DELAYED_RELEASE_TABLET | Freq: Every day | ORAL | Status: DC
  Administered 2022-04-29 – 2022-05-02 (×4): 40 mg via ORAL
  Filled 2022-04-28 (×4): qty 1

## 2022-04-28 MED ORDER — TRIAMCINOLONE ACETONIDE 0.025 % EX CREA: 1.0000 | TOPICAL_CREAM | Freq: Two times a day (BID) | CUTANEOUS | Status: DC | PRN

## 2022-04-28 MED ORDER — TACROLIMUS 0.1 % EX OINT: 1.0000 | TOPICAL_OINTMENT | Freq: Every day | CUTANEOUS | Status: DC | PRN

## 2022-04-28 MED ORDER — SODIUM CHLORIDE 0.9 % IV SOLN: INTRAVENOUS | Status: DC

## 2022-04-28 MED ORDER — DILTIAZEM HCL ER COATED BEADS 120 MG PO CP24
360.0000 mg | ORAL_CAPSULE | Freq: Every day | ORAL | Status: DC
  Administered 2022-04-28 – 2022-05-02 (×5): 360 mg via ORAL
  Filled 2022-04-28: qty 3
  Filled 2022-04-28: qty 2
  Filled 2022-04-28 (×3): qty 3

## 2022-04-28 MED ORDER — LEFLUNOMIDE 20 MG PO TABS
10.0000 mg | ORAL_TABLET | ORAL | Status: DC
  Administered 2022-04-29 – 2022-05-01 (×2): 10 mg via ORAL
  Filled 2022-04-28 (×2): qty 0.5

## 2022-04-28 MED ORDER — CRANBERRY 400 MG PO CAPS: 400.0000 mg | ORAL_CAPSULE | Freq: Every day | ORAL | Status: DC

## 2022-04-28 MED ORDER — MAGNESIUM OXIDE 400 MG PO TABS
200.0000 mg | ORAL_TABLET | Freq: Every day | ORAL | Status: DC
  Administered 2022-04-29 – 2022-05-02 (×4): 200 mg via ORAL
  Filled 2022-04-28 (×2): qty 0.5
  Filled 2022-04-28 (×3): qty 1
  Filled 2022-04-28: qty 0.5
  Filled 2022-04-28: qty 1
  Filled 2022-04-28: qty 0.5

## 2022-04-28 MED ORDER — GLIPIZIDE 5 MG PO TABS
5.0000 mg | ORAL_TABLET | Freq: Two times a day (BID) | ORAL | Status: DC
  Administered 2022-04-29 – 2022-05-02 (×6): 5 mg via ORAL
  Filled 2022-04-28 (×9): qty 1

## 2022-04-28 MED ORDER — TRAZODONE HCL 50 MG PO TABS: 25.0000 mg | ORAL_TABLET | Freq: Every evening | ORAL | Status: DC | PRN

## 2022-04-28 MED ORDER — ONDANSETRON HCL 4 MG PO TABS: 4.0000 mg | ORAL_TABLET | Freq: Four times a day (QID) | ORAL | Status: DC | PRN

## 2022-04-28 MED ORDER — ACETAMINOPHEN 325 MG PO TABS
650.0000 mg | ORAL_TABLET | Freq: Four times a day (QID) | ORAL | Status: DC | PRN
  Administered 2022-04-29 – 2022-04-30 (×4): 650 mg via ORAL
  Filled 2022-04-28 (×4): qty 2

## 2022-04-28 MED ORDER — FERROUS SULFATE 325 (65 FE) MG PO TABS
325.0000 mg | ORAL_TABLET | Freq: Every day | ORAL | Status: DC
  Administered 2022-04-29 – 2022-05-02 (×4): 325 mg via ORAL
  Filled 2022-04-28 (×4): qty 1

## 2022-04-28 MED ORDER — METOPROLOL TARTRATE 50 MG PO TABS
50.0000 mg | ORAL_TABLET | Freq: Two times a day (BID) | ORAL | Status: DC
  Administered 2022-04-28 – 2022-05-02 (×8): 50 mg via ORAL
  Filled 2022-04-28 (×4): qty 1
  Filled 2022-04-28: qty 2
  Filled 2022-04-28 (×3): qty 1

## 2022-04-28 MED ORDER — VITAMIN B-12 1000 MCG PO TABS
1000.0000 ug | ORAL_TABLET | Freq: Every day | ORAL | Status: DC
  Administered 2022-04-29 – 2022-05-02 (×4): 1000 ug via ORAL
  Filled 2022-04-28 (×4): qty 1

## 2022-04-28 MED ORDER — IOHEXOL 300 MG/ML  SOLN
75.0000 mL | Freq: Once | INTRAMUSCULAR | Status: AC | PRN
  Administered 2022-04-28: 75 mL via INTRAVENOUS

## 2022-04-28 MED ORDER — ASCORBIC ACID 500 MG PO TABS
500.0000 mg | ORAL_TABLET | Freq: Every day | ORAL | Status: DC
  Administered 2022-04-29 – 2022-05-02 (×4): 500 mg via ORAL
  Filled 2022-04-28 (×4): qty 1

## 2022-04-28 MED ORDER — LABETALOL HCL 5 MG/ML IV SOLN
20.0000 mg | INTRAVENOUS | Status: DC | PRN
  Administered 2022-04-28 – 2022-04-30 (×3): 20 mg via INTRAVENOUS
  Filled 2022-04-28 (×3): qty 4

## 2022-04-28 MED ORDER — SODIUM CHLORIDE 0.9 % IV BOLUS
1000.0000 mL | Freq: Once | INTRAVENOUS | Status: AC
  Administered 2022-04-28: 1000 mL via INTRAVENOUS

## 2022-04-28 MED ORDER — SODIUM CHLORIDE 0.9 % IV SOLN
2.0000 g | Freq: Two times a day (BID) | INTRAVENOUS | Status: DC
  Administered 2022-04-29 – 2022-05-02 (×7): 2 g via INTRAVENOUS
  Filled 2022-04-28 (×8): qty 12.5

## 2022-04-28 MED ORDER — ONDANSETRON HCL 4 MG/2ML IJ SOLN
4.0000 mg | Freq: Four times a day (QID) | INTRAMUSCULAR | Status: DC | PRN
  Administered 2022-04-30 – 2022-05-01 (×3): 4 mg via INTRAVENOUS
  Filled 2022-04-28 (×3): qty 2

## 2022-04-28 MED ORDER — SODIUM CHLORIDE 0.9 % IV SOLN
2.0000 g | INTRAVENOUS | Status: DC
  Administered 2022-04-28: 2 g via INTRAVENOUS
  Filled 2022-04-28 (×2): qty 20

## 2022-04-28 MED ORDER — ROSUVASTATIN CALCIUM 10 MG PO TABS
10.0000 mg | ORAL_TABLET | Freq: Every day | ORAL | Status: DC
  Administered 2022-04-29 – 2022-05-02 (×4): 10 mg via ORAL
  Filled 2022-04-28 (×4): qty 1

## 2022-04-28 MED ORDER — MAGNESIUM HYDROXIDE 400 MG/5ML PO SUSP
30.0000 mL | Freq: Every day | ORAL | Status: DC | PRN
Start: 2022-04-28 — End: 2022-05-02

## 2022-04-28 MED ORDER — ENOXAPARIN SODIUM 40 MG/0.4ML IJ SOSY
40.0000 mg | PREFILLED_SYRINGE | INTRAMUSCULAR | Status: DC
  Administered 2022-04-28 – 2022-04-29 (×2): 40 mg via SUBCUTANEOUS
  Filled 2022-04-28 (×2): qty 0.4

## 2022-04-28 MED ORDER — SODIUM CHLORIDE 0.9 % IV SOLN: 2.0000 g | Freq: Two times a day (BID) | INTRAVENOUS | Status: DC

## 2022-04-28 MED ORDER — RISAQUAD PO CAPS
1.0000 | ORAL_CAPSULE | Freq: Every day | ORAL | Status: DC
  Administered 2022-04-29 – 2022-05-02 (×4): 1 via ORAL
  Filled 2022-04-28 (×4): qty 1

## 2022-04-28 MED ORDER — HYDRALAZINE HCL 20 MG/ML IJ SOLN
10.0000 mg | Freq: Four times a day (QID) | INTRAMUSCULAR | Status: DC | PRN
  Administered 2022-04-29 – 2022-05-02 (×5): 10 mg via INTRAVENOUS
  Filled 2022-04-28 (×5): qty 1

## 2022-04-28 MED ORDER — BRIMONIDINE TARTRATE 0.2 % OP SOLN
1.0000 [drp] | Freq: Two times a day (BID) | OPHTHALMIC | Status: DC
  Administered 2022-04-29 – 2022-05-02 (×7): 1 [drp] via OPHTHALMIC
  Filled 2022-04-28: qty 5

## 2022-04-28 MED ORDER — SODIUM CHLORIDE 0.9 % IV SOLN: 2.0000 g | Freq: Once | INTRAVENOUS | Status: DC

## 2022-04-28 MED ORDER — ONDANSETRON HCL 4 MG/2ML IJ SOLN
4.0000 mg | Freq: Once | INTRAMUSCULAR | Status: AC
  Administered 2022-04-28: 4 mg via INTRAVENOUS
  Filled 2022-04-28: qty 2

## 2022-04-28 NOTE — H&P (Addendum)
[facility]   PATIENT NAME: Stephanie [name]    MR#:  [id number]  DATE OF BIRTH:  Jan 11, 1941  DATE OF ADMISSION:  04/28/2022  PRIMARY CARE PHYSICIAN: Rusty Aus, MD   Patient is coming from: Home  REQUESTING/REFERRING PHYSICIAN: Eula Flax, PA-C  CHIEF COMPLAINT:   Chief Complaint  Patient presents with   Nausea    HISTORY OF PRESENT ILLNESS:  Stephanie [name] is a 81 y.o. female with medical history significant for anemia, asthma, chronic kidney disease, type 2 diabetes mellitus, atrial fibrillation, GERD, glaucoma, hypertension, dyslipidemia, and vitamin B-12 deficiency, right hydronephrosis and right ureteral stent and that was recently exchanged on 5/9 and a chronic indwelling Foley catheter that was exchanged on 03/06/2022,  who presented to the emergency room with acute onset fever 104 yesterday for which he was given Tylenol and this morning had a recurrent fever 102 for which she was given Tylenol 2 hours before coming to the ER.  She has been feeling weak with mild cough as well as nausea without vomiting.  She admitted to lower abdominal pain and tenderness.  She denies any flank pain or hematuria.  She stated that her Foley catheter is supposed to be changed in a couple of days.  No chest pain or palpitations.  No wheezing or dyspnea.    ED Course: When she came to the ER, temperature was 99 vital signs were within normal and later BP was 202/80 with otherwise normal vital signs.  Labs revealed a CO2 of 19 and a glucose of 157 with BUN of 24 and creatinine 1.48 compared to 1.23 on 6/13 with alk phos 153 and albumin of 3.  Lactic acid was 1.1 twice and procalcitonin 0.31.  CBC showed leukocytosis 17.3 and anemia better than previous levels.    EKG as reviewed by me : EKG showed junctional rhythm with rate of 70 with left axis deviation, right bundle branch block. Imaging: Abdominal and pelvic CT scan revealed the following: 1. Interval development of a mild right  hydronephrosis since the prior CT and findings of pyelonephritis. Correlation with urinalysis recommended. 2. A 2 cm hypodense lesion in the mid to upper pole of the right kidney may represent an infected cyst. Abscess is less likely but not excluded. 3. No bowel obstruction. 4. There is a 3 mm right middle lobe nodule. No follow-up needed if patient is low-risk.This recommendation follows the consensus statement: Guidelines for Management of Incidental Pulmonary Nodules Detected on CT Images: From the Fleischner Society 2017; Radiology 2017; 284:228-243. 5. Aortic Atherosclerosis.  The patient was given 1 L bolus of IV normal saline, IV Rocephin initially and after review of previous urine culture that grew Klebsiella and E. coli, she was given 2 g of IV cefepime was sensitive to both, as well as 4 mg of IV Zofran.  Contact was made with Dr. Abner Greenspan with urology who recommended admission for the possibility of renal abscess.  She will be admitted to a medical telemetry bed for further evaluation and management. 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    has, does not wear    PAST SURGICAL HISTORY:   Past Surgical History:  Procedure Laterality Date   ABDOMINAL HYSTERECTOMY     BACK SURGERY     x5  "screws and rods"   BREAST BIOPSY Right 11/01/2021   rt br biopsy calcs ribbon clip/ benign 2nd site LIQ   BREAST BIOPSY Right 11/01/2021   rt br  stereo calcs coil clip 1st site / benign UIQ   COLON SURGERY     COLONOSCOPY WITH PROPOFOL N/A 05/22/2017   Procedure: COLONOSCOPY WITH PROPOFOL;  Surgeon: Manya Silvas, MD;  Location: [facility] ENDOSCOPY;  Service: Endoscopy;  Laterality: N/A;   COLONOSCOPY WITH PROPOFOL N/A 06/08/2021   Procedure: COLONOSCOPY WITH PROPOFOL;  Surgeon: Lucilla Lame, MD;  Location: [place];  Service: Endoscopy;  Laterality: N/A;  Diabetic   CYSTOSCOPY W/ URETERAL STENT PLACEMENT  07/25/2021   Procedure: CYSTOSCOPY WITH RETROGRADE PYELOGRAM/URETERAL STENT PLACEMENT;  Surgeon: Abbie Sons, MD;  Location: [facility] ORS;  Service: Urology;;   CYSTOSCOPY W/ URETERAL STENT PLACEMENT  02/06/2022   Procedure: CYSTOSCOPY WITH RETROGRADE PYELOGRAM/URETERAL STENT EXCHANGE;  Surgeon: Abbie Sons, MD;  Location: [facility] ORS;  Service: Urology;;   CYSTOSCOPY WITH STENT PLACEMENT Right 08/02/2020   Procedure: CYSTOSCOPY WITH STENT EXCHANGE;  Surgeon: Abbie Sons, MD;  Location: [facility];  Service: Urology;  Laterality: Right;   CYSTOSCOPY WITH STENT PLACEMENT Right 06/18/2020   Procedure: CYSTOSCOPY WITH STENT PLACEMENT;  Surgeon: Irine Seal, MD;  Location: [facility];  Service: Urology;  Laterality: Right;   EYE SURGERY Bilateral    Cataracts   FRACTURE SURGERY Right 2011   foot surgery   JOINT REPLACEMENT     KNEE ARTHROSCOPY Bilateral    LAPAROTOMY N/A 06/30/2020   Procedure: EXPLORATORY LAPAROTOMY;  Surgeon: Jules Husbands, MD;  Location: [facility] OR[facility];  Service: General;  Laterality: N/A;   LUMBAR LAMINECTOMY     POLYPECTOMY  06/08/2021   Procedure: POLYPECTOMY;  Surgeon: Lucilla Lame, MD;  Location: [place];  Service: Endoscopy;;   TEMPORARY DIALYSIS CATHETER N/A 06/27/2020   Procedure: TEMPORARY DIALYSIS CATHETER;  Surgeon: Katha Cabal, MD;  Location: [facility];  Service: Cardiovascular;  Laterality: N/A;    SOCIAL HISTORY:   Social History   Tobacco Use   Smoking  status: Never   Smokeless tobacco: Never  Substance Use Topics   Alcohol use: No    FAMILY HISTORY:   Family History  Problem Relation Age of Onset   Breast cancer Neg Hx     DRUG ALLERGIES:   Allergies  Allergen Reactions   Infliximab Hives, Shortness Of Breath and Itching   Ibuprofen Itching   Indomethacin Other (See Comments)    headache   Methotrexate Derivatives Other (See Comments)    Headache   Moexipril Other (See Comments)    unknown   Percocet [Oxycodone-Acetaminophen] Itching   Naprosyn [Naproxen] Rash    REVIEW OF SYSTEMS:   ROS As per history of present illness. All pertinent systems were reviewed above. Constitutional, HEENT, cardiovascular, respiratory, GI, GU, musculoskeletal, neuro, psychiatric, endocrine, integumentary and hematologic systems were reviewed and are otherwise  negative/unremarkable except for positive findings mentioned above in the HPI.   MEDICATIONS AT HOME:   Prior to Admission medications   Medication Sig Start Date End Date Taking? Authorizing Provider  acetaminophen (TYLENOL) 325 MG tablet Take 2 tablets (650 mg total) by mouth every 6 (six) hours as needed for moderate pain, fever or headache. 07/10/20   Ezekiel Slocumb, DO  ascorbic acid (VITAMIN C) 500 MG tablet Take 1 tablet (500 mg total) by mouth daily. 07/10/20   Nicole Kindred A, DO  brimonidine (ALPHAGAN) 0.2 % ophthalmic solution Place 1 drop into both eyes 2 (two) times daily. 05/31/20   [provider]  CONTOUR TEST test strip 1 each by Other route daily.  03/09/18   [provider]  Cranberry 400 MG CAPS Take 400 mg by mouth daily.  03/24/20   [provider]  diltiazem (CARDIZEM CD) 360 MG 24 hr capsule Take 1 capsule (360 mg total) by mouth in the morning and at bedtime. 03/13/22   Emeterio Reeve, DO  glipiZIDE (GLUCOTROL) 10 MG tablet Take 5 mg by mouth 2 (two) times daily before a meal.    [provider]  Iron-Vitamin C  (VITRON-C) 65-125 MG TABS Take 1 tablet by mouth daily.    [provider]  leflunomide (ARAVA) 10 MG tablet Take 10 mg by mouth every other day.    [provider]  Magnesium Oxide 250 MG TABS Take 250 mg by mouth daily.    [provider]  metFORMIN (GLUCOPHAGE-XR) 500 MG 24 hr tablet Take 500 mg by mouth every evening. 09/28/21   [provider]  metoprolol tartrate (LOPRESSOR) 50 MG tablet Take 50 mg by mouth 2 (two) times daily.    [provider]  omeprazole (PRILOSEC) 40 MG capsule Take 40 mg by mouth daily. 09/11/20   [provider]  Polyvinyl Alcohol (LIQUID TEARS OP) Place 1 drop into both eyes daily as needed (Dry eyes).    [provider]  Probiotic Product (PROBIOTIC-10 PO) Take 1 capsule by mouth daily.    [provider]  rosuvastatin (CRESTOR) 10 MG tablet Take 10 mg by mouth daily.  03/24/20   [provider]  tacrolimus (PROTOPIC) 0.1 % ointment Apply 1 application. topically daily as needed (irritation).    [provider]  triamcinolone (KENALOG) 0.025 % ointment Apply 1 application. topically 2 (two) times daily. Patient taking differently: Apply 1 application. topically 2 (two) times daily as needed (irritation). 12/22/21   Zara Council A, PA-C  vitamin B-12 (CYANOCOBALAMIN) 1000 MCG tablet Take 1,000 mcg by mouth daily.    [provider]      VITAL SIGNS:  Blood pressure (!) 180/73, pulse 68, temperature 98.4 F (36.9 C), temperature source Oral, resp. rate 18, height $RemoveBe'5\' 6"'bLgzgsSnG$  (1.676 m), weight 68 kg, SpO2 97 %.  PHYSICAL EXAMINATION:  Physical Exam  GENERAL:  81 y.o.-year-old Caucasian patient lying in the bed with no acute distress.  EYES: Pupils equal, round, reactive to light and accommodation. No scleral icterus. Extraocular muscles intact.  HEENT: Head atraumatic, normocephalic. Oropharynx and nasopharynx clear.  NECK:  Supple, no jugular venous distention. No  thyroid enlargement, no tenderness.  LUNGS: Normal breath sounds bilaterally, no wheezing, rales,rhonchi or crepitation. No use of accessory muscles of respiration.  CARDIOVASCULAR: Regular rate and rhythm, S1, S2 normal. No murmurs, rubs, or gallops.  ABDOMEN: Soft, nondistended, with mild suprapubic tenderness without rebound tenderness guarding or rigidity.. Bowel sounds present. No organomegaly  or mass.  EXTREMITIES: No pedal edema, cyanosis, or clubbing.  NEUROLOGIC: Cranial nerves II through XII are intact. Muscle strength 5/5 in all extremities. Sensation intact. Gait not checked.  PSYCHIATRIC: The patient is alert and oriented x 3.  Normal affect and good eye contact. SKIN: No obvious rash, lesion, or ulcer.   LABORATORY PANEL:   CBC Recent Labs  Lab 04/28/22 1634  WBC 17.3*  HGB 9.9*  HCT 31.7*  PLT 310   ------------------------------------------------------------------------------------------------------------------  Chemistries  Recent Labs  Lab 04/28/22 1634  NA 135  K 4.1  CL 110  CO2 19*  GLUCOSE 157*  BUN 24*  CREATININE 1.48*  CALCIUM 8.9  AST 23  ALT 17  ALKPHOS 153*  BILITOT 0.5   ------------------------------------------------------------------------------------------------------------------  Cardiac Enzymes No results for input(s): "TROPONINI" in the last 168 hours. ------------------------------------------------------------------------------------------------------------------  RADIOLOGY:  CT ABDOMEN PELVIS W CONTRAST  Result Date: 04/28/2022 CLINICAL DATA:  Abdominal pain. EXAM: CT ABDOMEN AND PELVIS WITH CONTRAST TECHNIQUE: Multidetector CT imaging of the abdomen and pelvis was performed using the standard protocol following bolus administration of intravenous contrast. RADIATION DOSE REDUCTION: This exam was performed according to the departmental dose-optimization program which includes automated exposure control, adjustment of the mA and/or  kV according to patient size and/or use of iterative reconstruction technique. CONTRAST:  60mL OMNIPAQUE IOHEXOL 300 MG/ML  SOLN COMPARISON:  CT abdomen pelvis dated 03/09/2022. FINDINGS: Evaluation of this exam is limited due to respiratory motion artifact as well as streak artifact caused by spinal hardware. Lower chest: There is a 3 mm right middle lobe nodule (3/4). Bibasilar linear atelectasis/scarring. There is coronary vascular calcification. No intra-abdominal free air or free fluid. Hepatobiliary: The liver and gallbladder appear unremarkable Pancreas: There is moderate atrophy of the body and tail of the pancreas. No active inflammatory changes. No dilatation of the main pancreatic duct. Spleen: Normal in size without focal abnormality. Adrenals/Urinary Tract: The adrenal glands are unremarkable. Similar positioning of the pigtail right ureteral stent with proximal tip in the renal pelvis and distal end within the urinary bladder. There is however interval development of a mild right hydronephrosis since the prior CT. There is apparent urothelial thickening and enhancement concerning for pyelonephritis. Correlation with urinalysis recommended. There is a 2 cm hypodense lesion in the mid to upper pole of the right kidney, which may represent an infected cyst. Abscess is less likely but not excluded. There is no hydronephrosis on the left. Several bilateral subcentimeter hypodense lesions are too small to characterize. The urinary bladder is predominantly collapsed around a Foley catheter. Small pockets of air within the bladder likely introduced via the catheter. Mild diffuse bladder wall thickening may be related to underdistention or cystitis. Stomach/Bowel: Postsurgical changes of the bowel with anastomotic suture in the region of the rectosigmoid. There is no bowel obstruction or active inflammation. The appendix is not visualized with certainty. No inflammatory changes identified in the right lower  quadrant. Vascular/Lymphatic: Advanced aortoiliac atherosclerotic disease. The IVC is unremarkable. No portal venous gas. There is no adenopathy. Reproductive: Hysterectomy.  No adnexal masses. Other: Midline vertical anterior pelvic wall incisional scar. Musculoskeletal: Extensive degenerative changes of the spine and lumbar laminectomy and fusion hardware. No acute osseous pathology. IMPRESSION: 1. Interval development of a mild right hydronephrosis since the prior CT and findings of pyelonephritis. Correlation with urinalysis recommended. 2. A 2 cm hypodense lesion in the mid to upper pole of the right kidney may represent an infected cyst. Abscess is less likely but not  excluded. 3. No bowel obstruction. 4. There is a 3 mm right middle lobe nodule. No follow-up needed if patient is low-risk.This recommendation follows the consensus statement: Guidelines for Management of Incidental Pulmonary Nodules Detected on CT Images: From the Fleischner Society 2017; Radiology 2017; 284:228-243. 5. Aortic Atherosclerosis (ICD10-I70.0). Electronically Signed   By: Anner Crete M.D.   On: 04/28/2022 20:21   DG Chest 2 View  Result Date: 04/28/2022 CLINICAL DATA:  Fever.  Patient does not feel well. EXAM: CHEST - 2 VIEW COMPARISON:  March 09, 2022 FINDINGS: The cardiomediastinal silhouette is stable. No pneumothorax. No nodules or masses. Mild haziness at the left base was not appreciated on the previous study. No other acute abnormalities. IMPRESSION: Mild haziness at the left base could represent early developing infiltrate/pneumonia in the appropriate clinical setting. No other interval changes or acute abnormalities. Electronically Signed   By: Dorise Bullion III M.D.   On: 04/28/2022 18:06      IMPRESSION AND PLAN:  Assessment and Plan: * Renal abscess - The patient has an infected cyst and less likely to be abscess but is not excluded. - Given significant leukocytosis we will manage as such.  She will be  admitted to a medical telemetry bed. - Continue antibiotic therapy IV cefepime based on her previous urine culture sensitivity on 6/9. - We will follow urine and blood cultures. - Urology consult will be obtained. - Dr. Abner Greenspan was notified and is aware about patient.  UTI (urinary tract infection) - She has a recurrent UTI. - Management as above.  Acute unilateral obstructive uropathy - The patient continues to have right hydronephrosis in the setting of right ureteral stent. - As mentioned above urology consult will be obtained. - Dr. Abner Greenspan is aware about the patient. - Management otherwise as above.  Hypertensive urgency - We will continue her antihypertensives. - We will place on as needed IV hydralazine and labetalol.  Acute kidney injury superimposed on chronic kidney disease ([place]) - This currently superimposed on stage IIIb chronic kidney disease. - The patient be hydrated with IV normal saline. - We will avoid nephrotoxins. - We will follow BMP. - Management otherwise as above  Paroxysmal atrial fibrillation (HCC) - We will continue Cardizem CD and Lopressor.  GERD without esophagitis - We will continue PPI therapy  Dyslipidemia - We will continue statin therapy.  Type II diabetes mellitus with renal manifestations (HCC) - This is treated with stage IIIb chronic kidney disease. - We will place on supplement coverage with NovoLog. - We will continue Glucotrol and hold off metformin given her AKI.       DVT prophylaxis: Lovenox. Advanced Care Planning:  Code Status: full code. Family Communication:  The plan of care was discussed in details with the patient (and family). I answered all questions. The patient agreed to proceed with the above mentioned plan. Further management will depend upon hospital course. Disposition Plan: Back to previous home environment Consults called: Urology.   All the records are reviewed and case discussed with ED provider.  Status is:  Inpatient  At the time of the admission, it appears that the appropriate admission status for this patient is inpatient.  This is judged to be reasonable and necessary in order to provide the required intensity of service to ensure the patient's safety given the presenting symptoms, physical exam findings and initial radiographic and laboratory data in the context of comorbid conditions.  The patient requires inpatient status due to high intensity of service,  high risk of further deterioration and high frequency of surveillance required.  I certify that at the time of admission, it is my clinical judgment that the patient will require inpatient hospital care extending more than 2 midnights.                            Dispo: The patient is from: Home              Anticipated d/c is to: Home              Patient currently is not medically stable to d/c.              Difficult to place patient: No  Christel Mormon M.D on 04/28/2022 at 11:45 PM  [facility]   From 7 PM-7 AM, contact night-coverage [email or web address]  CC: Primary care physician; Rusty Aus, MD

## 2022-04-28 NOTE — ED Triage Notes (Signed)
First Nurse Note;   Pt via EMS from home. Pt c/o doesn't feeling good and fever. VSS. Pt is A&OX4 and NAD  101 orally, pt took Tylenol 2 hours ago 160/70 74 HR 173 CBG 28 CO2  EMS gave pt 4 of Zofran

## 2022-04-28 NOTE — Assessment & Plan Note (Signed)
-   She has a recurrent UTI. - Management as above.

## 2022-04-28 NOTE — Assessment & Plan Note (Signed)
-   We will continue her antihypertensives. - We will place on as needed IV hydralazine and labetalol.

## 2022-04-28 NOTE — ED Provider Notes (Addendum)
  Physical Exam  BP (!) 165/71   Pulse 66   Temp 99 F (37.2 C) (Oral)   Resp 18   Ht '5\' 6"'$  (1.676 m)   Wt 72.6 kg   SpO2 97%   BMI 25.82 kg/m   Physical Exam  Procedures  Procedures  ED Course / MDM    Medical Decision Making Amount and/or Complexity of Data Reviewed Labs: ordered. Radiology: ordered.  Risk Prescription drug management. Decision regarding hospitalization.   Assumed patient care from Loraine Leriche, MD.  CT abdomen pelvis returned with 2 cm right-sided possible infected renal cyst versus developing abscess.  CT abdomen pelvis findings also suggestive of right-sided pyelonephritis.  I reviewed patient's history and presenting symptoms with urology, Dr. Abner Greenspan.  He recommended admission for IV antibiotics and observation.  I reviewed patient's urine culture from 6/9 and see that patient's Klebsiella pneumonia was resistant to Rocephin.  We will add on cefepime as E. coli and Klebsiella pneumonia are both sensitive to this antibiotic.  Dr. Sidney Ace accepted patient for admission.        Vallarie Mare [place], PA-C 04/28/22 2038    Lannie Fields, PA-C 04/28/22 2055    Nance Pear, MD 04/28/22 2256

## 2022-04-28 NOTE — ED Notes (Signed)
Request made for transport to the floor ?

## 2022-04-28 NOTE — Progress Notes (Signed)
Pt being followed by [facility] for Sepsis protocol. 

## 2022-04-28 NOTE — Assessment & Plan Note (Signed)
-   Urology seen.  Appreciate their input.  They recommend broad-spectrum IV antibiotic to be continued for at least 48 hours followed by p.o. for next 2 weeks If she fails IV antibiotic may need reimaging and percutaneous drainage under IR

## 2022-04-28 NOTE — ED Provider Notes (Addendum)
[facility] Provider Note    Event Date/Time   First MD Initiated Contact with Patient 04/28/22 1729     (approximate)   History   Nausea   HPI  Stephanie [name] is a 81 y.o. female with rheumatoid arthritis, diabetes, hypertension, bladder cancer, CKD, A-fib not on anticoagulation, right hydronephrosis with ureteral stent most recently exchanged on 5/9 with chronic indwelling Foley EXTR exchanged on 6/6.  I reviewed patient's hospital admission from 03/2022 where patient was discharged on ciprofloxacin for E. coli, Klebsiella UTI/pyelonephritis.  Patient reports yesterday having a fever.  The husband reports a temperature of 104 and giving some Tylenol and this morning having a temperature of 102 and giving Tylenol.  Last Tylenol was 2 hours ago.  She reports just feeling overall weak a little bit of cough as well as some nausea.  She does report some lower abdominal tenderness as well.  She reports that her Foley was supposed to be changed in a couple of days.  Denies any rash, leg swelling other concern     Physical Exam   Triage Vital Signs: ED Triage Vitals [04/28/22 1632]  Enc Vitals Group     BP 139/79     Pulse Rate 73     Resp 18     Temp 99 F (37.2 C)     Temp Source Oral     SpO2 96 %     Weight 160 lb (72.6 kg)     Height '5\' 6"'$  (1.676 m)     Head Circumference      Peak Flow      Pain Score 2     Pain Loc      Pain Edu?      Excl. in [place]?     Most recent vital signs: Vitals:   04/28/22 1632  BP: 139/79  Pulse: 73  Resp: 18  Temp: 99 F (37.2 C)  SpO2: 96%     General: Awake, no distress.  CV:  Good peripheral perfusion.  Resp:  Normal effort.  Abd:  No distention.  Tender in the lower abdomen.  Foley catheter in place with reassuring looking urine. Other:     ED Results / Procedures / Treatments   Labs (all labs ordered are listed, but only abnormal results are displayed) Labs Reviewed  COMPREHENSIVE METABOLIC  PANEL - Abnormal; Notable for the following components:      Result Value   CO2 19 (*)    Glucose, Bld 157 (*)    BUN 24 (*)    Creatinine, Ser 1.48 (*)    Albumin 3.0 (*)    Alkaline Phosphatase 153 (*)    GFR, Estimated 36 (*)    All other components within normal limits  CBC - Abnormal; Notable for the following components:   WBC 17.3 (*)    RBC 3.70 (*)    Hemoglobin 9.9 (*)    HCT 31.7 (*)    All other components within normal limits  SARS CORONAVIRUS 2 BY RT PCR  LIPASE, BLOOD  URINALYSIS, ROUTINE W REFLEX MICROSCOPIC     EKG  My interpretation of EKG:  Normal sinus rate of 70 without any ST elevation or T wave inversions, right bundle branch block  RADIOLOGY I have reviewed the xray personally and interpreted there is a little bit of mild left lower possible infiltrate  PROCEDURES:  Critical Care performed: No  .1-3 Lead EKG Interpretation  Performed by: Vanessa [place], MD Authorized  by: Vanessa [place], MD     Interpretation: normal     ECG rate:  60   ECG rate assessment: normal     Rhythm: sinus rhythm     Ectopy: none     Conduction: normal      MEDICATIONS ORDERED IN ED: Medications  sodium chloride 0.9 % bolus 1,000 mL (1,000 mLs Intravenous New Bag/Given 04/28/22 1831)  ondansetron (ZOFRAN) injection 4 mg (4 mg Intravenous Given 04/28/22 1832)     IMPRESSION / MDM / ASSESSMENT AND PLAN / ED COURSE  I reviewed the triage vital signs and the nursing notes.   Patient's presentation is most consistent with acute presentation with potential threat to life or bodily function.   Differential includes UTI, pneumonia, COVID, pyelonephritis, diverticulitis.  We will replace the Foley catheter and get a fresh sample.  CBC shows elevated white count of 17.3.  Hemoglobin is stable.  Lactate normal.  COVID-negative.  Procalcitonin slightly elevated.  Lipase normal.  Creatinine slightly elevated 1.48.  Patient does not meet sepsis criteria only has the  elevated white count at this time.  We will continue to closely monitor and will hold off on antibiotics until we know what we are treating.  Patient will be handed off to oncoming team pending CT imaging and probable admission   Urine looks infected but this could just be from the stent.  We will continue getting the CT scan cover her with ceftriaxone.  The patient is on the cardiac monitor to evaluate for evidence of arrhythmia and/or significant heart rate changes.      FINAL CLINICAL IMPRESSION(S) / ED DIAGNOSES   Final diagnoses:  Fever, unspecified fever cause     Rx / DC Orders   ED Discharge Orders     None        Note:  This document was prepared using Dragon voice recognition software and may include unintentional dictation errors.   Vanessa [place], MD 04/28/22 Remonia Richter    Vanessa Hughes, MD 04/28/22 1944

## 2022-04-28 NOTE — Assessment & Plan Note (Signed)
-   This is treated with stage IIIb chronic kidney disease. - We will place on supplement coverage with NovoLog. - We will continue Glucotrol and hold off metformin given her AKI.

## 2022-04-28 NOTE — ED Triage Notes (Signed)
Nausea at home last night, Abd pain to LLQ.

## 2022-04-28 NOTE — Assessment & Plan Note (Signed)
-   Urology reviewed the CT scan.  It is consistent with mild right hydronephrosis with chronic indwelling stent in appropriate position exchanged on 5/23 by Dr. Bernardo Heater.  No need to exchange presently per urology as stents appear to be draining

## 2022-04-28 NOTE — Progress Notes (Signed)
CODE SEPSIS - PHARMACY COMMUNICATION  **Broad Spectrum Antibiotics should be administered within 1 hour of Sepsis diagnosis**  Time Code Sepsis Called/Page Received: 19:33  Antibiotics Ordered: Ceftriaxone  Time of 1st antibiotic administration: 20:12  Additional action taken by pharmacy: n/a  If necessary, Name of Provider/Nurse Contacted: n/a    Vira Blanco ,PharmD Clinical Pharmacist  04/28/2022  8:12 PM

## 2022-04-28 NOTE — Assessment & Plan Note (Signed)
-  continue PPI therapy

## 2022-04-28 NOTE — ED Notes (Signed)
Pt to CT

## 2022-04-28 NOTE — Assessment & Plan Note (Addendum)
-   This currently superimposed on stage IIIb chronic kidney disease. - The patient be hydrated with IV normal saline. - We will avoid nephrotoxins. - We will follow BMP. - Management otherwise as above

## 2022-04-28 NOTE — Assessment & Plan Note (Signed)
-   We will continue statin therapy. 

## 2022-04-28 NOTE — Assessment & Plan Note (Signed)
-   We will continue Cardizem CD and Lopressor.

## 2022-04-29 DIAGNOSIS — N281 Cyst of kidney, acquired: Secondary | ICD-10-CM

## 2022-04-29 DIAGNOSIS — N390 Urinary tract infection, site not specified: Secondary | ICD-10-CM | POA: Diagnosis not present

## 2022-04-29 DIAGNOSIS — N179 Acute kidney failure, unspecified: Secondary | ICD-10-CM | POA: Diagnosis not present

## 2022-04-29 DIAGNOSIS — N1832 Chronic kidney disease, stage 3b: Secondary | ICD-10-CM

## 2022-04-29 DIAGNOSIS — I160 Hypertensive urgency: Secondary | ICD-10-CM | POA: Diagnosis not present

## 2022-04-29 DIAGNOSIS — L89159 Pressure ulcer of sacral region, unspecified stage: Secondary | ICD-10-CM | POA: Diagnosis present

## 2022-04-29 LAB — MAGNESIUM: Magnesium: 1.4 mg/dL — ABNORMAL LOW (ref 1.7–2.4)

## 2022-04-29 LAB — CBC
HCT: 29.7 % — ABNORMAL LOW (ref 36.0–46.0)
Hemoglobin: 9.4 g/dL — ABNORMAL LOW (ref 12.0–15.0)
MCH: 27.1 pg (ref 26.0–34.0)
MCHC: 31.6 g/dL (ref 30.0–36.0)
MCV: 85.6 fL (ref 80.0–100.0)
Platelets: 257 10*3/uL (ref 150–400)
RBC: 3.47 MIL/uL — ABNORMAL LOW (ref 3.87–5.11)
RDW: 14.5 % (ref 11.5–15.5)
WBC: 14.7 10*3/uL — ABNORMAL HIGH (ref 4.0–10.5)
nRBC: 0 % (ref 0.0–0.2)

## 2022-04-29 LAB — BASIC METABOLIC PANEL
Anion gap: 7 (ref 5–15)
BUN: 19 mg/dL (ref 8–23)
CO2: 20 mmol/L — ABNORMAL LOW (ref 22–32)
Calcium: 8.2 mg/dL — ABNORMAL LOW (ref 8.9–10.3)
Chloride: 109 mmol/L (ref 98–111)
Creatinine, Ser: 1.4 mg/dL — ABNORMAL HIGH (ref 0.44–1.00)
GFR, Estimated: 38 mL/min — ABNORMAL LOW (ref >60)
Glucose, Bld: 128 mg/dL — ABNORMAL HIGH (ref 70–99)
Potassium: 3.4 mmol/L — ABNORMAL LOW (ref 3.5–5.1)
Sodium: 136 mmol/L (ref 135–145)

## 2022-04-29 LAB — GLUCOSE, CAPILLARY
Glucose-Capillary: 160 mg/dL — ABNORMAL HIGH (ref 70–99)
Glucose-Capillary: 204 mg/dL — ABNORMAL HIGH (ref 70–99)
Glucose-Capillary: 140 mg/dL — ABNORMAL HIGH (ref 70–99)
Glucose-Capillary: 147 mg/dL — ABNORMAL HIGH (ref 70–99)

## 2022-04-29 LAB — PROCALCITONIN: Procalcitonin: 0.3 ng/mL

## 2022-04-29 MED ORDER — CHLORHEXIDINE GLUCONATE CLOTH 2 % EX PADS
6.0000 | MEDICATED_PAD | Freq: Every day | CUTANEOUS | Status: DC
  Administered 2022-04-29 – 2022-05-02 (×4): 6 via TOPICAL

## 2022-04-29 MED ORDER — INSULIN ASPART 100 UNIT/ML IJ SOLN: 0.0000 [IU] | Freq: Every day | INTRAMUSCULAR | Status: DC

## 2022-04-29 MED ORDER — CLONIDINE HCL 0.1 MG PO TABS
0.1000 mg | ORAL_TABLET | Freq: Once | ORAL | Status: AC
  Administered 2022-04-29: 0.1 mg via ORAL
  Filled 2022-04-29: qty 1

## 2022-04-29 MED ORDER — MAGNESIUM SULFATE 2 GM/50ML IV SOLN
2.0000 g | Freq: Once | INTRAVENOUS | Status: AC
  Administered 2022-04-29: 2 g via INTRAVENOUS
  Filled 2022-04-29: qty 50

## 2022-04-29 MED ORDER — INSULIN ASPART 100 UNIT/ML IJ SOLN
0.0000 [IU] | Freq: Three times a day (TID) | INTRAMUSCULAR | Status: DC
  Administered 2022-04-29: 2 [IU] via SUBCUTANEOUS
  Administered 2022-04-29: 3 [IU] via SUBCUTANEOUS
  Administered 2022-04-29: 1 [IU] via SUBCUTANEOUS
  Administered 2022-04-30: 5 [IU] via SUBCUTANEOUS
  Administered 2022-04-30: 2 [IU] via SUBCUTANEOUS
  Administered 2022-04-30: 1 [IU] via SUBCUTANEOUS
  Administered 2022-05-01: 5 [IU] via SUBCUTANEOUS
  Administered 2022-05-01: 1 [IU] via SUBCUTANEOUS
  Filled 2022-04-29 (×8): qty 1

## 2022-04-29 MED ORDER — POTASSIUM CHLORIDE CRYS ER 20 MEQ PO TBCR
40.0000 meq | EXTENDED_RELEASE_TABLET | Freq: Once | ORAL | Status: AC
  Administered 2022-04-29: 40 meq via ORAL
  Filled 2022-04-29: qty 2

## 2022-04-29 NOTE — TOC Initial Note (Addendum)
Transition of Care [facility]) - Initial/Assessment Note    Patient Details  Name: Stephanie [name] MRN: [id number] Date of Birth: Mar 21, 1941  Transition [facility]) CM/SW Contact:    Magnus Ivan, LCSW Phone Number: 04/29/2022, 9:59 AM  Clinical Narrative:                 Called into patient's room, patient is on airborne isolation. Spoke with spouse who is at bedside. Patient lives with her spouse who provides transportation. PCP is Dr. [name] Heck. Pharmacy is [facility]. Patient has a walker, bedside commode, and shower chair.  Patient is active with [facility] and they would like to continue this at DC. Confirmed with Representative [name], patient is active with PT and OT.  Expected Discharge Plan: [place] Barriers to Discharge: Continued Medical Work up   Patient Goals and CMS Choice Patient states their goals for this hospitalization and ongoing recovery are:: home with home health CMS Medicare.gov Compare Post Acute Care list provided to:: Patient Represenative (must comment) Choice offered to / list presented to : Spouse  Expected Discharge Plan and Services Expected Discharge Plan: [place]       Living arrangements for the past 2 months: Single Family Home                                      Prior Living Arrangements/Services Living arrangements for the past 2 months: Single Family Home Lives with:: Spouse Patient language and need for interpreter reviewed:: Yes Do you feel safe going back to the place where you live?: Yes      Need for Family Participation in Patient Care: Yes (Comment) Care giver support system in place?: Yes (comment) Current home services: DME, Home PT Criminal Activity/Legal Involvement Pertinent to Current Situation/Hospitalization: No - Comment as needed  Activities of Daily Living   ADL Screening (condition at time of admission) Patient's cognitive ability adequate to safely  complete daily activities?: Yes Is the patient deaf or have difficulty hearing?: Yes Does the patient have difficulty seeing, even when wearing glasses/contacts?: No Does the patient have difficulty concentrating, remembering, or making decisions?: No Patient able to express need for assistance with ADLs?: Yes Does the patient have difficulty dressing or bathing?: Yes Independently performs ADLs?: No Communication: Independent Dressing (OT): Needs assistance Is this a change from baseline?: Pre-admission baseline Grooming: Needs assistance Is this a change from baseline?: Pre-admission baseline Feeding: Independent Bathing: Needs assistance  Permission Sought/Granted Permission sought to share information with : Facility Art therapist granted to share information with : Yes, Verbal Permission Granted (by spouse)     Permission granted to share info w AGENCY: [facility]        Emotional Assessment         Alcohol / Substance Use: Not Applicable Psych Involvement: No (comment)  Admission diagnosis:  Renal abscess [N15.1] Fever, unspecified fever cause [R50.9] Patient Active Problem List   Diagnosis Date Noted   Renal abscess 04/28/2022   Acute unilateral obstructive uropathy 04/28/2022   Acute kidney injury superimposed on chronic kidney disease ([place]) 04/28/2022   Dyslipidemia 04/28/2022   GERD without esophagitis 04/28/2022   Hypertensive urgency 04/28/2022   Hypokalemia 03/12/2022   Sepsis secondary to acute pyelonephritis(HCC) 03/09/2022   Indwelling Foley catheter present 03/09/2022   Hyperkalemia 07/25/2021   Hydronephrosis of right kidney 07/25/2021  Type II diabetes mellitus with renal manifestations (HCC)    Normocytic anemia    Asthma    Acute renal failure superimposed on stage 3a chronic kidney disease ([place])    UTI (urinary tract infection)    Abnormal computed tomography of large intestine    Polyp of descending colon    S/p exchange  of ureteral stent 02/06/22 03/22/2021   Aortic atherosclerosis ([place]) 02/23/2021   Stage 3b chronic kidney disease ([place]) 02/14/2021   Proteinuria 08/18/2020   UPJ obstruction, acquired 08/18/2020   Anemia secondary to renal failure 07/20/2020   CKD (chronic kidney disease) stage 5, GFR less than 15 ml/min (HCC)    Ileus (HCC)    Paroxysmal atrial fibrillation ([employer])    Goals of care, counseling/discussion    Palliative care by specialist    SBO (small bowel obstruction) ([place])    Pressure injury of skin 06/19/2020   Acquired thrombophilia ([place]) 06/19/2020   On continuous oral anticoagulation 06/19/2020   Acute pyelonephritis 06/18/2020   Hydronephrosis, right 06/18/2020   Lactic acid acidosis 06/18/2020   Hyperglycemia 06/18/2020   Hx of essential hypertension 06/18/2020   Immunosuppression due to drug therapy ([place]) 06/18/2020   Atrial fibrillation with rapid ventricular response ([place])    Essential hypertension    Pure hypercholesterolemia    Diabetes mellitus ([place]) 06/17/2020   Rheumatoid arthritis ([place]) 06/17/2020   COVID-19 virus infection 06/17/2020   AKI (acute kidney injury) ([place]) 06/17/2020   Chronic left SI joint pain 04/19/2020   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[place]) 04/19/2020   Chronic radicular lumbar pain 04/19/2020   Lumbar spondylosis 04/19/2020   Chronic pain syndrome 04/19/2020   Sepsis ([place]) 07/05/2019   CAP (community acquired pneumonia) 07/05/2019   Gastroenteritis 07/05/2019   DM type 2 with diabetic mixed hyperlipidemia ([place]) 04/30/2018   Elevated serum creatinine 10/16/2017   Rheumatoid arthritis involving multiple sites with positive rheumatoid factor ([place]) 08/13/2016   Hyperlipidemia, mixed 04/18/2016   Gross hematuria 11/01/2015   History of colon cancer 10/18/2014   Squamous cell metaplasia of urinary bladder 04/29/2014   Osteoarthritis of knee 04/01/2014   B-complex deficiency 03/03/2014   Benign essential hypertension 01/16/2014    Unspecified thoracic, thoracolumbar and lumbosacral intervertebral disc disorder 01/16/2014   Malignant neoplasm of colon ([place]) 01/16/2014   Bladder neoplasm of uncertain malignant potential 09/14/2013   Nocturia 08/05/2013   Scoliosis (and kyphoscoliosis), idiopathic 06/24/2013   Chronic cystitis 05/04/2013   Increased frequency of urination 05/04/2013   Incomplete emptying of bladder 05/04/2013   PCP:  Rusty Aus, MD Pharmacy:   [facility] [place], [address] AT [address] [place] [address] Phone: [phone] Fax: [phone]     Social Determinants of Health (SDOH) Interventions    Readmission Risk Interventions    04/29/2022    9:58 AM 02/14/2021    1:28 PM 06/21/2020    1:54 PM  Readmission Risk Prevention Plan  Transportation Screening Complete Complete Complete  PCP or Specialist Appt within 3-5 Days Complete Complete Complete  HRI or Home Care Consult Complete  Complete  Social Work Consult for [place] Planning/Counseling Complete Complete Complete  Palliative Care Screening Not Applicable Not Applicable Not Applicable  Medication Review Press photographer) Complete Complete

## 2022-04-29 NOTE — Assessment & Plan Note (Signed)
Stage I

## 2022-04-29 NOTE — Progress Notes (Signed)
   04/28/22 2231  Assess: MEWS Score  Temp 98.7 F (37.1 C)  BP (!) 202/82 (Reported to RN Nurse.)  MAP (mmHg) 116  Pulse Rate 77  Resp 20  Level of Consciousness Alert  SpO2 97 %  O2 Device Room Air  Assess: MEWS Score  MEWS Temp 0  MEWS Systolic 2  MEWS Pulse 0  MEWS RR 0  MEWS LOC 0  MEWS Score 2  MEWS Score Color Yellow  Assess: if the MEWS score is Yellow or Red  Were vital signs taken at a resting state? Yes  Focused Assessment No change from prior assessment  Does the patient meet 2 or more of the SIRS criteria? No  MEWS guidelines implemented *See Row Information* Yes  Treat  MEWS Interventions Escalated (See documentation below)  Pain Scale 0-10  Pain Score 0  Take Vital Signs  Increase Vital Sign Frequency  Yellow: Q 2hr X 2 then Q 4hr X 2, if remains yellow, continue Q 4hrs  Escalate  MEWS: Escalate Yellow: discuss with charge nurse/RN and consider discussing with provider and RRT  Notify: Charge Nurse/RN  Name of Charge Nurse/RN Notified Almyra Free, RN  Date Charge Nurse/RN Notified 04/28/22  Time Charge Nurse/RN Notified 2232  Notify: Provider  Provider Name/Title Sharion Settler, NP, Sidney Ace, MD  Date Provider Notified 04/28/22  Time Provider Notified 2249  Method of Notification Page  Notification Reason Other (Comment) (BP 202/82)  Provider response Evaluate remotely  Date of Provider Response 04/28/22  Time of Provider Response 2300  Document  Patient Outcome Other (Comment)  Progress note created (see row info) Yes  Assess: SIRS CRITERIA  SIRS Temperature  0  SIRS Pulse 0  SIRS Respirations  0  SIRS WBC 1  SIRS Score Sum  1

## 2022-04-29 NOTE — Progress Notes (Addendum)
  Progress Note   Patient: Stephanie [name]:[id number] DOB: Nov 10, 1940 DOA: 04/28/2022     1 DOS: the patient was seen and examined on 04/29/2022   Brief hospital course: 81 y.o. female with medical history significant for anemia, asthma, chronic kidney disease, type 2 diabetes mellitus, atrial fibrillation, GERD, glaucoma, hypertension, dyslipidemia, and vitamin B-12 deficiency, right hydronephrosis and right ureteral stent and that was recently exchanged on 5/9 and a chronic indwelling Foley catheter that was exchanged on 03/06/2022 admitted for infected right upper pole renal cyst versus renal abscess  7/30: Urology seen and recommends conservative management with IV antibiotics for now   Assessment and Plan: * Infected kidney cyst - Urology seen.  Appreciate their input.  They recommend broad-spectrum IV antibiotic to be continued for at least 48 hours followed by p.o. for next 2 weeks If she fails IV antibiotic may need reimaging and percutaneous drainage under IR  UTI (urinary tract infection) - Continue IV cefepime.  Await urine culture  Acute unilateral obstructive uropathy - Urology reviewed the CT scan.  It is consistent with mild right hydronephrosis with chronic indwelling stent in appropriate position exchanged on 5/23 by Dr. Bernardo Heater.  No need to exchange presently per urology as stents appear to be draining  Hypertensive urgency - Present on admission and now resolved.  Continue current blood pressure medicine  Acute renal failure superimposed on stage 3b chronic kidney disease ([place]) - Continue IV hydration monitor renal function Lab Results  Component Value Date   CREATININE 1.40 (H) 04/29/2022   CREATININE 1.48 (H) 04/28/2022   CREATININE 1.23 (H) 03/13/2022    Paroxysmal atrial fibrillation (HCC) - We will continue Cardizem CD and Lopressor.  Decubitus ulcer of coccyx, unspecified pressure ulcer stage Stage I  GERD without esophagitis - continue PPI  therapy  Dyslipidemia -  continue statin therapy.  Type II diabetes mellitus with renal manifestations (HCC) - Continue sliding scale and Glucotrol       Subjective: Reports feeling about the same.  Very tired and fatigued and wants to sleep  Physical Exam: Vitals:   04/29/22 0128 04/29/22 0341 04/29/22 0459 04/29/22 0915  BP: (!) 182/65 123/70 133/60 (!) 165/68  Pulse: 77 67 66 70  Resp: '16 18 16 18  '$ Temp: 99.3 F (37.4 C) 98.7 F (37.1 C) 98.9 F (37.2 C) 99 F (37.2 C)  TempSrc: Oral Oral Oral   SpO2: 97% 98% 97% 98%  Weight:      Height:       81 year old female lying in the bed comfortably without any acute distress Eyes pupil equal round and reactive to light and accommodation Lungs clear to auscultation bilaterally Cardiovascular regular rate and rhythm Abdomen soft, benign Neuro alert and oriented, nonfocal Psych normal mood and affect Data Reviewed:  Creatinine 1.4, potassium 3.4, magnesium 1.4  Family Communication: None  Disposition: Status is: Inpatient Remains inpatient appropriate because: Getting IV antibiotics and electrolyte replacement   Planned Discharge Destination: Home    DVT prophylaxis-Lovenox Time spent: 35 minutes  Author: Max Sane, MD 04/29/2022 1:20 PM  For on call review [email or web address].

## 2022-04-29 NOTE — Consult Note (Signed)
Urology Consult   Physician requesting consult: Eugenie Norrie, MD  Reason for consult: Chronic right hydronephrosis, infected right renal cyst vs abscess  History of Present Illness: Stephanie [name] is a 81 y.o. patient of Dr. 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  Her right ureteral stent was last exchanged on 02/06/2022.  She also has chronic urinary retention managed with an indwelling Foley catheter.  She presented to the ED last night with complaints of fevers, chills.  She reportedly had a fever up to 104 yesterday that was treated with Tylenol.  She was afebrile in the ED with normal vital signs.  She did have some hypertension.  Her creatinine was 1.48.  CBC demonstrated leukocytosis of 17.3.  Urinalysis with negative nitrite and large leukocytes in setting of indwelling ureteral stent and Foley catheter.  CT A/P 04/28/2022 demonstrated mild right hydronephrosis with findings of pyelonephritis.  The right ureteral stent was seen to be in appropriate position.  Also seen was a 2 cm hypodense lesion in the mid to upper pole of the right kidney that could represent infected cyst.  Abscess was considered less likely.  She is admitted to hospital service and started on IV antibiotics.  She denies any abdominal pain or flank pain.  She denies chills.  She is tolerating her Foley catheter.   Past Medical History:  Diagnosis Date   Anemia    Arthritis    Asthma    Cancer ([place]) 1996   colon   Chronic kidney disease    Complication of anesthesia    nausea   Diabetes mellitus without complication ([place])    Dysrhythmia    Foley catheter in place    GERD (gastroesophageal reflux disease)    Glaucoma    Hyperlipidemia    Hypertension    Lumbar disc disease    Pneumonia    PONV (postoperative nausea and vomiting)    Small bowel obstruction ([facility])    Urinary retention    Uses walker    Vitamin B 12 deficiency    Wears  hearing aid in right ear    has, does not wear    Past Surgical History:  Procedure Laterality Date   ABDOMINAL HYSTERECTOMY     BACK SURGERY     x5  "screws and rods"   BREAST BIOPSY Right 11/01/2021   rt br biopsy calcs ribbon clip/ benign 2nd site LIQ   BREAST BIOPSY Right 11/01/2021   rt br stereo calcs coil clip 1st site / benign UIQ   COLON SURGERY     COLONOSCOPY WITH PROPOFOL N/A 05/22/2017   Procedure: COLONOSCOPY WITH PROPOFOL;  Surgeon: Manya Silvas, MD;  Location: [facility];  Service: Endoscopy;  Laterality: N/A;   COLONOSCOPY WITH PROPOFOL N/A 06/08/2021   Procedure: COLONOSCOPY WITH PROPOFOL;  Surgeon: Lucilla Lame, MD;  Location: [facility];  Service: Endoscopy;  Laterality: N/A;  Diabetic   CYSTOSCOPY W/ URETERAL STENT PLACEMENT  07/25/2021   Procedure: CYSTOSCOPY WITH RETROGRADE PYELOGRAM/URETERAL STENT PLACEMENT;  Surgeon: Abbie Sons, MD;  Location: [facility] ORS;  Service: Urology;;   CYSTOSCOPY W/ URETERAL STENT PLACEMENT  02/06/2022   Procedure: CYSTOSCOPY WITH RETROGRADE PYELOGRAM/URETERAL STENT EXCHANGE;  Surgeon: Abbie Sons, MD;  Location: [facility];  Service: Urology;;   CYSTOSCOPY WITH STENT PLACEMENT Right 08/02/2020   Procedure: CYSTOSCOPY WITH STENT EXCHANGE;  Surgeon: Abbie Sons, MD;  Location: [facility];  Service: Urology;  Laterality:  Right;   CYSTOSCOPY WITH STENT PLACEMENT Right 06/18/2020   Procedure: CYSTOSCOPY WITH STENT PLACEMENT;  Surgeon: Irine Seal, MD;  Location: [facility];  Service: Urology;  Laterality: Right;   EYE SURGERY Bilateral    Cataracts   FRACTURE SURGERY Right 2011   foot surgery   JOINT REPLACEMENT     KNEE ARTHROSCOPY Bilateral    LAPAROTOMY N/A 06/30/2020   Procedure: EXPLORATORY LAPAROTOMY;  Surgeon: Jules Husbands, MD;  Location: [facility];  Service: General;  Laterality: N/A;   LUMBAR LAMINECTOMY     POLYPECTOMY  06/08/2021   Procedure: POLYPECTOMY;  Surgeon: Lucilla Lame, MD;  Location:  [place];  Service: Endoscopy;;   TEMPORARY DIALYSIS CATHETER N/A 06/27/2020   Procedure: TEMPORARY DIALYSIS CATHETER;  Surgeon: Katha Cabal, MD;  Location: [facility];  Service: Cardiovascular;  Laterality: N/A;     Current Hospital Medications:  Home meds:  No current facility-administered medications on file prior to encounter.   Current Outpatient Medications on File Prior to Encounter  Medication Sig Dispense Refill   acetaminophen (TYLENOL) 325 MG tablet Take 2 tablets (650 mg total) by mouth every 6 (six) hours as needed for moderate pain, fever or headache.     ascorbic acid (VITAMIN C) 500 MG tablet Take 1 tablet (500 mg total) by mouth daily.     brimonidine (ALPHAGAN) 0.2 % ophthalmic solution Place 1 drop into both eyes 2 (two) times daily.     CONTOUR TEST test strip 1 each by Other route daily.   5   Cranberry 400 MG CAPS Take 400 mg by mouth daily.      diltiazem (CARDIZEM CD) 360 MG 24 hr capsule Take 1 capsule (360 mg total) by mouth in the morning and at bedtime. 30 capsule 0   glipiZIDE (GLUCOTROL) 10 MG tablet Take 5 mg by mouth 2 (two) times daily before a meal.     Iron-Vitamin C (VITRON-C) 65-125 MG TABS Take 1 tablet by mouth daily.     leflunomide (ARAVA) 10 MG tablet Take 10 mg by mouth every other day.     Magnesium Oxide 250 MG TABS Take 250 mg by mouth daily.     metFORMIN (GLUCOPHAGE-XR) 500 MG 24 hr tablet Take 500 mg by mouth every evening.     metoprolol tartrate (LOPRESSOR) 50 MG tablet Take 50 mg by mouth 2 (two) times daily.     omeprazole (PRILOSEC) 40 MG capsule Take 40 mg by mouth daily.     Polyvinyl Alcohol (LIQUID TEARS OP) Place 1 drop into both eyes daily as needed (Dry eyes).     Probiotic Product (PROBIOTIC-10 PO) Take 1 capsule by mouth daily.     rosuvastatin (CRESTOR) 10 MG tablet Take 10 mg by mouth daily.      tacrolimus (PROTOPIC) 0.1 % ointment Apply 1 application. topically daily as needed (irritation).      triamcinolone (KENALOG) 0.025 % ointment Apply 1 application. topically 2 (two) times daily. (Patient taking differently: Apply 1 application. topically 2 (two) times daily as needed (irritation).) 30 g 0   vitamin B-12 (CYANOCOBALAMIN) 1000 MCG tablet Take 1,000 mcg by mouth daily.       Scheduled Meds:  acidophilus  1 capsule Oral Daily   ascorbic acid  500 mg Oral Daily   brimonidine  1 drop Both Eyes BID   cyanocobalamin  1,000 mcg Oral Daily   diltiazem  360 mg Oral Daily   enoxaparin (LOVENOX) injection  40  mg Subcutaneous Q24H   ferrous sulfate  325 mg Oral Daily   glipiZIDE  5 mg Oral BID AC   insulin aspart  0-5 Units Subcutaneous QHS   insulin aspart  0-9 Units Subcutaneous TID WC   leflunomide  10 mg Oral QODAY   magnesium oxide  200 mg Oral Daily   metoprolol tartrate  50 mg Oral BID   pantoprazole  40 mg Oral Daily   potassium chloride  40 mEq Oral Once   rosuvastatin  10 mg Oral Daily   Continuous Infusions:  sodium chloride 100 mL/hr at 04/29/22 0348   ceFEPime (MAXIPIME) IV     PRN Meds:.acetaminophen **OR** acetaminophen, hydrALAZINE, labetalol, magnesium hydroxide, ondansetron **OR** ondansetron (ZOFRAN) IV, tacrolimus, traZODone, triamcinolone  Allergies:  Allergies  Allergen Reactions   Infliximab Hives, Shortness Of Breath and Itching   Ibuprofen Itching   Indomethacin Other (See Comments)    headache   Methotrexate Derivatives Other (See Comments)    Headache   Moexipril Other (See Comments)    unknown   Percocet [Oxycodone-Acetaminophen] Itching   Naprosyn [Naproxen] Rash    Family History  Problem Relation Age of Onset   Breast cancer Neg Hx     Social History:  reports that she has never smoked. She has never used smokeless tobacco. She reports that she does not drink alcohol and does not use drugs.  ROS: A complete review of systems was performed.  All systems are negative except for pertinent findings as noted.  Physical Exam:  Vital  signs in last 24 hours: Temp:  [97.8 F (36.6 C)-99.3 F (37.4 C)] 98.9 F (37.2 C) (07/30 0459) Pulse Rate:  [62-77] 66 (07/30 0459) Resp:  [16-20] 16 (07/30 0459) BP: (123-202)/(60-82) 133/60 (07/30 0459) SpO2:  [95 %-98 %] 97 % (07/30 0459) Weight:  [68 kg-72.6 kg] 68 kg (07/29 2231) Constitutional:  Alert and oriented, No acute distress Cardiovascular: Regular rate and rhythm Respiratory: Normal respiratory effort, Lungs clear bilaterally GI: Abdomen is soft, nontender, nondistended, no abdominal masses GU: No CVA tenderness Neurologic: Grossly intact, no focal deficits Psychiatric: Normal mood and affect  Laboratory Data:  Recent Labs    04/28/22 1634 04/29/22 0335  WBC 17.3* 14.7*  HGB 9.9* 9.4*  HCT 31.7* 29.7*  PLT 310 257    Recent Labs    04/28/22 1634 04/29/22 0335  NA 135 136  K 4.1 3.4*  CL 110 109  GLUCOSE 157* 128*  BUN 24* 19  CALCIUM 8.9 8.2*  CREATININE 1.48* 1.40*     Results for orders placed or performed during the hospital encounter of 04/28/22 (from the past 24 hour(s))  Lipase, blood     Status: None   Collection Time: 04/28/22  4:34 PM  Result Value Ref Range   Lipase 25 11 - 51 U/L  Comprehensive metabolic panel     Status: Abnormal   Collection Time: 04/28/22  4:34 PM  Result Value Ref Range   Sodium 135 135 - 145 mmol/L   Potassium 4.1 3.5 - 5.1 mmol/L   Chloride 110 98 - 111 mmol/L   CO2 19 (L) 22 - 32 mmol/L   Glucose, Bld 157 (H) 70 - 99 mg/dL   BUN 24 (H) 8 - 23 mg/dL   Creatinine, Ser 1.48 (H) 0.44 - 1.00 mg/dL   Calcium 8.9 8.9 - 10.3 mg/dL   Total Protein 6.8 6.5 - 8.1 g/dL   Albumin 3.0 (L) 3.5 - 5.0 g/dL   AST 23 15 -  41 U/L   ALT 17 0 - 44 U/L   Alkaline Phosphatase 153 (H) 38 - 126 U/L   Total Bilirubin 0.5 0.3 - 1.2 mg/dL   GFR, Estimated 36 (L) >60 mL/min   Anion gap 6 5 - 15  CBC     Status: Abnormal   Collection Time: 04/28/22  4:34 PM  Result Value Ref Range   WBC 17.3 (H) 4.0 - 10.5 K/uL   RBC 3.70 (L)  3.87 - 5.11 MIL/uL   Hemoglobin 9.9 (L) 12.0 - 15.0 g/dL   HCT 31.7 (L) 36.0 - 46.0 %   MCV 85.7 80.0 - 100.0 fL   MCH 26.8 26.0 - 34.0 pg   MCHC 31.2 30.0 - 36.0 g/dL   RDW 14.4 11.5 - 15.5 %   Platelets 310 150 - 400 K/uL   nRBC 0.0 0.0 - 0.2 %  Procalcitonin - Baseline     Status: None   Collection Time: 04/28/22  4:47 PM  Result Value Ref Range   Procalcitonin 0.31 ng/mL  SARS Coronavirus 2 by RT PCR (hospital order, performed in [place] hospital lab) *cepheid single result test* Anterior Nasal Swab     Status: None   Collection Time: 04/28/22  5:52 PM   Specimen: Anterior Nasal Swab  Result Value Ref Range   SARS Coronavirus 2 by RT PCR NEGATIVE NEGATIVE  Urinalysis, Routine w reflex microscopic     Status: Abnormal   Collection Time: 04/28/22  6:23 PM  Result Value Ref Range   Color, Urine YELLOW (A) YELLOW   APPearance TURBID (A) CLEAR   Specific Gravity, Urine 1.018 1.005 - 1.030   pH 5.0 5.0 - 8.0   Glucose, UA NEGATIVE NEGATIVE mg/dL   Hgb urine dipstick NEGATIVE NEGATIVE   Bilirubin Urine NEGATIVE NEGATIVE   Ketones, ur NEGATIVE NEGATIVE mg/dL   Protein, ur >=300 (A) NEGATIVE mg/dL   Nitrite NEGATIVE NEGATIVE   Leukocytes,Ua LARGE (A) NEGATIVE   RBC / HPF 21-50 0 - 5 RBC/hpf   WBC, UA >50 (H) 0 - 5 WBC/hpf   Bacteria, UA MANY (A) NONE SEEN   Squamous Epithelial / LPF 0-5 0 - 5   WBC Clumps PRESENT   Blood culture (routine x 2)     Status: None (Preliminary result)   Collection Time: 04/28/22  6:23 PM   Specimen: Left Antecubital; Blood  Result Value Ref Range   Specimen Description LEFT ANTECUBITAL    Special Requests      BOTTLES DRAWN AEROBIC AND ANAEROBIC Blood Culture results may not be optimal due to an excessive volume of blood received in culture bottles   Culture      NO GROWTH < 12 HOURS Performed at [facility], [address] [place], [place] [address]    Report Status PENDING   Blood culture (routine x 2)     Status: None  (Preliminary result)   Collection Time: 04/28/22  6:23 PM   Specimen: BLOOD  Result Value Ref Range   Specimen Description BLOOD RIGHT ANTECUBITAL    Special Requests      BOTTLES DRAWN AEROBIC AND ANAEROBIC Blood Culture results may not be optimal due to an inadequate volume of blood received in culture bottles   Culture      NO GROWTH < 12 HOURS Performed at [facility], [address] [place], [place] [address]    Report Status PENDING   Lactic acid, plasma     Status: None  Collection Time: 04/28/22  6:23 PM  Result Value Ref Range   Lactic Acid, Venous 1.1 0.5 - 1.9 mmol/L  Lactic acid, plasma     Status: None   Collection Time: 04/28/22  9:00 PM  Result Value Ref Range   Lactic Acid, Venous 1.1 0.5 - 1.9 mmol/L  Procalcitonin     Status: None   Collection Time: 04/29/22  3:35 AM  Result Value Ref Range   Procalcitonin 0.30 ng/mL  Basic metabolic panel     Status: Abnormal   Collection Time: 04/29/22  3:35 AM  Result Value Ref Range   Sodium 136 135 - 145 mmol/L   Potassium 3.4 (L) 3.5 - 5.1 mmol/L   Chloride 109 98 - 111 mmol/L   CO2 20 (L) 22 - 32 mmol/L   Glucose, Bld 128 (H) 70 - 99 mg/dL   BUN 19 8 - 23 mg/dL   Creatinine, Ser 1.40 (H) 0.44 - 1.00 mg/dL   Calcium 8.2 (L) 8.9 - 10.3 mg/dL   GFR, Estimated 38 (L) >60 mL/min   Anion gap 7 5 - 15  CBC     Status: Abnormal   Collection Time: 04/29/22  3:35 AM  Result Value Ref Range   WBC 14.7 (H) 4.0 - 10.5 K/uL   RBC 3.47 (L) 3.87 - 5.11 MIL/uL   Hemoglobin 9.4 (L) 12.0 - 15.0 g/dL   HCT 29.7 (L) 36.0 - 46.0 %   MCV 85.6 80.0 - 100.0 fL   MCH 27.1 26.0 - 34.0 pg   MCHC 31.6 30.0 - 36.0 g/dL   RDW 14.5 11.5 - 15.5 %   Platelets 257 150 - 400 K/uL   nRBC 0.0 0.0 - 0.2 %   Recent Results (from the past 240 hour(s))  SARS Coronavirus 2 by RT PCR (hospital order, performed in [facility] hospital lab) *cepheid single result test* Anterior Nasal Swab     Status: None   Collection Time: 04/28/22  5:52  PM   Specimen: Anterior Nasal Swab  Result Value Ref Range Status   SARS Coronavirus 2 by RT PCR NEGATIVE NEGATIVE Final    Comment: (NOTE) SARS-CoV-2 target nucleic acids are NOT DETECTED.  The SARS-CoV-2 RNA is generally detectable in upper and lower respiratory specimens during the acute phase of infection. The lowest concentration of SARS-CoV-2 viral copies this assay can detect is 250 copies / mL. A negative result does not preclude SARS-CoV-2 infection and should not be used as the sole basis for treatment or other patient management decisions.  A negative result may occur with improper specimen collection / handling, submission of specimen other than nasopharyngeal swab, presence of viral mutation(s) within the areas targeted by this assay, and inadequate number of viral copies (<250 copies / mL). A negative result must be combined with clinical observations, patient history, and epidemiological information.  Fact Sheet for Patients:   https://www.patel.info/  Fact Sheet for Healthcare Providers: https://hall.com/  This test is not yet approved or  cleared by the [place] FDA and has been authorized for detection and/or diagnosis of SARS-CoV-2 by FDA under an Emergency Use Authorization (EUA).  This EUA will remain in effect (meaning this test can be used) for the duration of the COVID-19 declaration under Section 564(b)(1) of the Act, 21 U.S.C. section 360bbb-3(b)(1), unless the authorization is terminated or revoked sooner.  Performed at [facility], [address], [place], [place] [address]   Blood culture (routine x 2)     Status:  None (Preliminary result)   Collection Time: 04/28/22  6:23 PM   Specimen: Left Antecubital; Blood  Result Value Ref Range Status   Specimen Description LEFT ANTECUBITAL  Final   Special Requests   Final    BOTTLES DRAWN AEROBIC AND ANAEROBIC Blood Culture results may not be  optimal due to an excessive volume of blood received in culture bottles   Culture   Final    NO GROWTH < 12 HOURS Performed at [facility], [address] [place], [place] [address]    Report Status PENDING  Incomplete  Blood culture (routine x 2)     Status: None (Preliminary result)   Collection Time: 04/28/22  6:23 PM   Specimen: BLOOD  Result Value Ref Range Status   Specimen Description BLOOD RIGHT ANTECUBITAL  Final   Special Requests   Final    BOTTLES DRAWN AEROBIC AND ANAEROBIC Blood Culture results may not be optimal due to an inadequate volume of blood received in culture bottles   Culture   Final    NO GROWTH < 12 HOURS Performed at [facility], [address] [place], [place] [address]    Report Status PENDING  Incomplete    Renal Function: Recent Labs    04/28/22 1634 04/29/22 0335  CREATININE 1.48* 1.40*   Estimated Creatinine Clearance: 30.5 mL/min (A) (by C-G formula based on SCr of 1.4 mg/dL (H)).  Radiologic Imaging: CT ABDOMEN PELVIS W CONTRAST  Result Date: 04/28/2022 CLINICAL DATA:  Abdominal pain. EXAM: CT ABDOMEN AND PELVIS WITH CONTRAST TECHNIQUE: Multidetector CT imaging of the abdomen and pelvis was performed using the standard protocol following bolus administration of intravenous contrast. RADIATION DOSE REDUCTION: This exam was performed according to the departmental dose-optimization program which includes automated exposure control, adjustment of the mA and/or kV according to patient size and/or use of iterative reconstruction technique. CONTRAST:  3m OMNIPAQUE IOHEXOL 300 MG/ML  SOLN COMPARISON:  CT abdomen pelvis dated 03/09/2022. FINDINGS: Evaluation of this exam is limited due to respiratory motion artifact as well as streak artifact caused by spinal hardware. Lower chest: There is a 3 mm right middle lobe nodule (3/4). Bibasilar linear atelectasis/scarring. There is coronary vascular calcification. No intra-abdominal free  air or free fluid. Hepatobiliary: The liver and gallbladder appear unremarkable Pancreas: There is moderate atrophy of the body and tail of the pancreas. No active inflammatory changes. No dilatation of the main pancreatic duct. Spleen: Normal in size without focal abnormality. Adrenals/Urinary Tract: The adrenal glands are unremarkable. Similar positioning of the pigtail right ureteral stent with proximal tip in the renal pelvis and distal end within the urinary bladder. There is however interval development of a mild right hydronephrosis since the prior CT. There is apparent urothelial thickening and enhancement concerning for pyelonephritis. Correlation with urinalysis recommended. There is a 2 cm hypodense lesion in the mid to upper pole of the right kidney, which may represent an infected cyst. Abscess is less likely but not excluded. There is no hydronephrosis on the left. Several bilateral subcentimeter hypodense lesions are too small to characterize. The urinary bladder is predominantly collapsed around a Foley catheter. Small pockets of air within the bladder likely introduced via the catheter. Mild diffuse bladder wall thickening may be related to underdistention or cystitis. Stomach/Bowel: Postsurgical changes of the bowel with anastomotic suture in the region of the rectosigmoid. There is no bowel obstruction or active inflammation. The appendix is not visualized with certainty. No inflammatory changes identified in the right  lower quadrant. Vascular/Lymphatic: Advanced aortoiliac atherosclerotic disease. The IVC is unremarkable. No portal venous gas. There is no adenopathy. Reproductive: Hysterectomy.  No adnexal masses. Other: Midline vertical anterior pelvic wall incisional scar. Musculoskeletal: Extensive degenerative changes of the spine and lumbar laminectomy and fusion hardware. No acute osseous pathology. IMPRESSION: 1. Interval development of a mild right hydronephrosis since the prior CT and  findings of pyelonephritis. Correlation with urinalysis recommended. 2. A 2 cm hypodense lesion in the mid to upper pole of the right kidney may represent an infected cyst. Abscess is less likely but not excluded. 3. No bowel obstruction. 4. There is a 3 mm right middle lobe nodule. No follow-up needed if patient is low-risk.This recommendation follows the consensus statement: Guidelines for Management of Incidental Pulmonary Nodules Detected on CT Images: From the Fleischner Society 2017; Radiology 2017; 284:228-243. 5. Aortic Atherosclerosis (ICD10-I70.0). Electronically Signed   By: Anner Crete M.D.   On: 04/28/2022 20:21   DG Chest 2 View  Result Date: 04/28/2022 CLINICAL DATA:  Fever.  Patient does not feel well. EXAM: CHEST - 2 VIEW COMPARISON:  March 09, 2022 FINDINGS: The cardiomediastinal silhouette is stable. No pneumothorax. No nodules or masses. Mild haziness at the left base was not appreciated on the previous study. No other acute abnormalities. IMPRESSION: Mild haziness at the left base could represent early developing infiltrate/pneumonia in the appropriate clinical setting. No other interval changes or acute abnormalities. Electronically Signed   By: Dorise Bullion III M.D.   On: 04/28/2022 18:06    I independently reviewed the above imaging studies.  Impression/Recommendation: Infected right upper pole renal cyst versus renal abscess: CT A/P 04/19/2022 with 2 cm hyperdense lesion in the upper pole the right kidney likely representing an infected cyst.  Patient afebrile, WBC 17, urinalysis negative nitrites. Chronic right UPJ obstruction managed with chronic indwelling stents, last exchanged in 01/2022 by Dr. Bernardo Heater.  CT A/P with mild right hydronephrosis and stent in appropriate position.  -I reviewed CT imaging with evidence of right upper pole renal cyst measuring about 2 cm.  -I recommend broad spectrum antibiotics and treat for at least 48 hours and then likely PO abx for 2  weeks.  -If pt decompensates and does not respond well to initial IV abx, recommend re-imaging kidney and consider percutaneous drainage. No need for drainage presently as fluid collection is <76mand pt is not ill. -Stent appears in appropriate drainage only mild hydronephrosis. No need to exchange presently. -Following  Matt R. Abbagayle Zaragoza MD 04/29/2022, 6:37 AM  [facility]  Pager: [phone]  CC: JEugenie Norrie MD

## 2022-04-29 NOTE — Hospital Course (Signed)
81 y.o. female with medical history significant for anemia, asthma, chronic kidney disease, type 2 diabetes mellitus, atrial fibrillation, GERD, glaucoma, hypertension, dyslipidemia, and vitamin B-12 deficiency, right hydronephrosis and right ureteral stent and that was recently exchanged on 5/9 and a chronic indwelling Foley catheter that was exchanged on 03/06/2022 admitted for infected right upper pole renal cyst versus renal abscess  7/30: Urology seen and recommends conservative management with IV antibiotics for now 7/31: Lidocaine patch added last night due to pain control issue 8/1: PT, OT consult

## 2022-04-30 DIAGNOSIS — N281 Cyst of kidney, acquired: Secondary | ICD-10-CM | POA: Diagnosis not present

## 2022-04-30 DIAGNOSIS — N390 Urinary tract infection, site not specified: Secondary | ICD-10-CM | POA: Diagnosis not present

## 2022-04-30 DIAGNOSIS — N139 Obstructive and reflux uropathy, unspecified: Secondary | ICD-10-CM | POA: Diagnosis not present

## 2022-04-30 DIAGNOSIS — N179 Acute kidney failure, unspecified: Secondary | ICD-10-CM | POA: Diagnosis not present

## 2022-04-30 LAB — BASIC METABOLIC PANEL
Anion gap: 8 (ref 5–15)
BUN: 18 mg/dL (ref 8–23)
CO2: 16 mmol/L — ABNORMAL LOW (ref 22–32)
Calcium: 8.4 mg/dL — ABNORMAL LOW (ref 8.9–10.3)
Chloride: 115 mmol/L — ABNORMAL HIGH (ref 98–111)
Creatinine, Ser: 1.48 mg/dL — ABNORMAL HIGH (ref 0.44–1.00)
GFR, Estimated: 36 mL/min — ABNORMAL LOW (ref >60)
Glucose, Bld: 154 mg/dL — ABNORMAL HIGH (ref 70–99)
Potassium: 3.3 mmol/L — ABNORMAL LOW (ref 3.5–5.1)
Sodium: 139 mmol/L (ref 135–145)

## 2022-04-30 LAB — CBC
HCT: 29.5 % — ABNORMAL LOW (ref 36.0–46.0)
Hemoglobin: 9.3 g/dL — ABNORMAL LOW (ref 12.0–15.0)
MCH: 27 pg (ref 26.0–34.0)
MCHC: 31.5 g/dL (ref 30.0–36.0)
MCV: 85.8 fL (ref 80.0–100.0)
Platelets: 230 10*3/uL (ref 150–400)
RBC: 3.44 MIL/uL — ABNORMAL LOW (ref 3.87–5.11)
RDW: 14.6 % (ref 11.5–15.5)
WBC: 12.7 10*3/uL — ABNORMAL HIGH (ref 4.0–10.5)
nRBC: 0 % (ref 0.0–0.2)

## 2022-04-30 LAB — URINE CULTURE

## 2022-04-30 LAB — GLUCOSE, CAPILLARY
Glucose-Capillary: 150 mg/dL — ABNORMAL HIGH (ref 70–99)
Glucose-Capillary: 177 mg/dL — ABNORMAL HIGH (ref 70–99)
Glucose-Capillary: 258 mg/dL — ABNORMAL HIGH (ref 70–99)
Glucose-Capillary: 141 mg/dL — ABNORMAL HIGH (ref 70–99)

## 2022-04-30 LAB — PROCALCITONIN: Procalcitonin: 0.18 ng/mL

## 2022-04-30 LAB — MAGNESIUM: Magnesium: 1.9 mg/dL (ref 1.7–2.4)

## 2022-04-30 MED ORDER — LIDOCAINE-PRILOCAINE 2.5-2.5 % EX CREA
TOPICAL_CREAM | Freq: Four times a day (QID) | CUTANEOUS | Status: DC | PRN
  Filled 2022-04-30: qty 5

## 2022-04-30 MED ORDER — ENOXAPARIN SODIUM 30 MG/0.3ML IJ SOSY
30.0000 mg | PREFILLED_SYRINGE | INTRAMUSCULAR | Status: DC
  Administered 2022-04-30: 30 mg via SUBCUTANEOUS
  Filled 2022-04-30: qty 0.3

## 2022-04-30 MED ORDER — LIDOCAINE 5 % EX PTCH
1.0000 | MEDICATED_PATCH | CUTANEOUS | Status: DC
  Administered 2022-04-30 – 2022-05-02 (×3): 1 via TRANSDERMAL
  Filled 2022-04-30 (×3): qty 1

## 2022-04-30 MED ORDER — POTASSIUM CHLORIDE CRYS ER 20 MEQ PO TBCR
40.0000 meq | EXTENDED_RELEASE_TABLET | Freq: Once | ORAL | Status: AC
  Administered 2022-04-30: 40 meq via ORAL
  Filled 2022-04-30: qty 2

## 2022-04-30 MED ORDER — POTASSIUM CHLORIDE CRYS ER 20 MEQ PO TBCR: 40.0000 meq | EXTENDED_RELEASE_TABLET | Freq: Once | ORAL | Status: DC

## 2022-04-30 NOTE — Assessment & Plan Note (Signed)
-   Continue IV hydration monitor renal function.  Renal function about same Lab Results  Component Value Date   CREATININE 1.48 (H) 04/30/2022   CREATININE 1.40 (H) 04/29/2022   CREATININE 1.48 (H) 04/28/2022

## 2022-04-30 NOTE — Assessment & Plan Note (Signed)
-   continue statin therapy. 

## 2022-04-30 NOTE — Assessment & Plan Note (Addendum)
-   Continue IV cefepime.  urine culture growing mixed species

## 2022-04-30 NOTE — Assessment & Plan Note (Signed)
-  continue PPI therapy

## 2022-04-30 NOTE — Assessment & Plan Note (Signed)
-   Present on admission and now resolved.  Continue current blood pressure medicine.

## 2022-04-30 NOTE — Progress Notes (Signed)
  Progress Note   Patient: Stephanie [name] YIA:[id number] DOB: 1941-01-16 DOA: 04/28/2022     2 DOS: the patient was seen and examined on 04/30/2022   Brief hospital course: 81 y.o. female with medical history significant for anemia, asthma, chronic kidney disease, type 2 diabetes mellitus, atrial fibrillation, GERD, glaucoma, hypertension, dyslipidemia, and vitamin B-12 deficiency, right hydronephrosis and right ureteral stent and that was recently exchanged on 5/9 and a chronic indwelling Foley catheter that was exchanged on 03/06/2022 admitted for infected right upper pole renal cyst versus renal abscess  7/30: Urology seen and recommends conservative management with IV antibiotics for now 7/31: Lidocaine patch added last night due to pain control issue   Assessment and Plan: * Infected kidney cyst - Urology seen.  Appreciate their input.  Continue broad-spectrum IV antibiotic to be continued for at least 48 hours followed by p.o. for next 2 weeks If she fails IV antibiotic may need reimaging and percutaneous drainage under IR  UTI (urinary tract infection) - Continue IV cefepime.  urine culture growing mixed species  Acute unilateral obstructive uropathy - Urology reviewed the CT scan.  It is consistent with mild right hydronephrosis with chronic indwelling stent in appropriate position exchanged on 5/23 by Dr. Bernardo Heater.  No need to exchange presently per urology as stents appear to be draining.  Hypertensive urgency - Present on admission and now resolved.  Continue current blood pressure medicine.  Acute renal failure superimposed on stage 3b chronic kidney disease ([place]) - Continue IV hydration monitor renal function.  Renal function about same Lab Results  Component Value Date   CREATININE 1.48 (H) 04/30/2022   CREATININE 1.40 (H) 04/29/2022   CREATININE 1.48 (H) 04/28/2022    Paroxysmal atrial fibrillation ([place]) -  continue Cardizem CD and Lopressor.  Decubitus ulcer of coccyx,  unspecified pressure ulcer stage Stage I -dressing changes as requested per wound care nurse  GERD without esophagitis - continue PPI therapy.  Dyslipidemia -  continue statin therapy  Type II diabetes mellitus with renal manifestations ([facility]) - Continue sliding scale and Glucotrol.        Subjective: Sitting in the chair.  Very hard of hearing.  Husband at bedside.  Feeling better  Physical Exam: Vitals:   04/30/22 0425 04/30/22 0559 04/30/22 0640 04/30/22 0744  BP: (!) 195/75 (!) 178/65 138/60 (!) 146/56  Pulse: 72 68 70 69  Resp: '20  20 18  '$ Temp: 99.3 F (37.4 C)  98.3 F (36.8 C) 98 F (36.7 C)  TempSrc: Oral   Oral  SpO2: 97%  98% 98%  Weight:      Height:       81 year old female lying in the bed comfortably without any acute distress Eyes pupil equal round and reactive to light and accommodation Lungs clear to auscultation bilaterally Cardiovascular regular rate and rhythm Abdomen soft, benign Neuro alert and oriented, nonfocal Psych normal mood and affect  Data Reviewed:  Potassium 3.3, creatinine 1.48  Family Communication: Updated husband at bedside  Disposition: Status is: Inpatient Remains inpatient appropriate because: IV antibiotic.  Electrolyte replacement and await improvement of renal function   Planned Discharge Destination: Home with Home Health    DVT prophylaxis-Lovenox Time spent: 35 minutes  Author: Max Sane, MD 04/30/2022 2:44 PM  For on call review [email or web address].

## 2022-04-30 NOTE — Assessment & Plan Note (Signed)
-    continue Cardizem CD and Lopressor.

## 2022-04-30 NOTE — Progress Notes (Signed)
PHARMACIST - PHYSICIAN COMMUNICATION  CONCERNING:  Enoxaparin (Lovenox) for DVT Prophylaxis    RECOMMENDATION: Patient was prescribed enoxaprin '40mg'$  q24 hours for VTE prophylaxis.   Filed Weights   04/28/22 1632 04/28/22 2231  Weight: 72.6 kg (160 lb) 68 kg (149 lb 14.6 oz)    Body mass index is 24.2 kg/m.  Estimated Creatinine Clearance: 28.9 mL/min (A) (by C-G formula based on SCr of 1.48 mg/dL (H)).   Patient is candidate for enoxaparin '30mg'$  every 24 hours based on CrCl <52m/min.  DESCRIPTION: Pharm[facility]  Patient is now receiving enoxaparin 30 mg every 24 hours    CGretel Acre PharmD PGY1 Pharmacy Resident 04/30/2022 1:58 PM

## 2022-04-30 NOTE — Assessment & Plan Note (Signed)
-   Urology seen.  Appreciate their input.  Continue broad-spectrum IV antibiotic to be continued for at least 48 hours followed by p.o. for next 2 weeks If she fails IV antibiotic may need reimaging and percutaneous drainage under IR

## 2022-04-30 NOTE — Assessment & Plan Note (Signed)
-   Urology reviewed the CT scan.  It is consistent with mild right hydronephrosis with chronic indwelling stent in appropriate position exchanged on 5/23 by Dr. Bernardo Heater.  No need to exchange presently per urology as stents appear to be draining.

## 2022-04-30 NOTE — Progress Notes (Addendum)
       CROSS COVER NOTE  NAME: [name] MRN: [id number] DOB : 01-16-1941    Date of Service   04/30/2022  HPI/Events of Note   Request for additional pain meds received for report of 7/10 back pain attributed to hospital bed being uncomfortable and laying in same position.  Interventions   Plan: Lidocaine patch  Assist with Repositioning in bed    This document was prepared using Dragon voice recognition software and may include unintentional dictation errors.  Neomia Glass DNP, MHA, FNP-BC Nurse Practitioner [facility] Pager [phone]

## 2022-04-30 NOTE — Assessment & Plan Note (Signed)
-   Continue sliding scale and Glucotrol.

## 2022-04-30 NOTE — Progress Notes (Signed)
       CROSS COVER NOTE  NAME: [name] MRN: [id number] DOB : [birth year]    Date of Service   04/30/22  HPI/Events of Note   Medication request received from nursing to address 9/10 chronic shoulder pain. Patient reports she takes 400 mg Advil at home.  M(r)s Hilgers is currently being treated for an AKI on CKD-3B.  Interventions    Plan:  Chronic Shoulder Pain EMLA cream Avoid Nephrotoxic Meds       This document was prepared using Dragon voice recognition software and may include unintentional dictation errors.  Neomia Glass DNP, MHA, FNP-BC Nurse Practitioner [facility] Pager [phone]

## 2022-05-01 DIAGNOSIS — N281 Cyst of kidney, acquired: Secondary | ICD-10-CM | POA: Diagnosis not present

## 2022-05-01 DIAGNOSIS — I160 Hypertensive urgency: Secondary | ICD-10-CM | POA: Diagnosis not present

## 2022-05-01 DIAGNOSIS — N139 Obstructive and reflux uropathy, unspecified: Secondary | ICD-10-CM | POA: Diagnosis not present

## 2022-05-01 DIAGNOSIS — N390 Urinary tract infection, site not specified: Secondary | ICD-10-CM | POA: Diagnosis not present

## 2022-05-01 LAB — BASIC METABOLIC PANEL
Anion gap: 7 (ref 5–15)
BUN: 18 mg/dL (ref 8–23)
CO2: 16 mmol/L — ABNORMAL LOW (ref 22–32)
Calcium: 8.6 mg/dL — ABNORMAL LOW (ref 8.9–10.3)
Chloride: 114 mmol/L — ABNORMAL HIGH (ref 98–111)
Creatinine, Ser: 1.33 mg/dL — ABNORMAL HIGH (ref 0.44–1.00)
GFR, Estimated: 41 mL/min — ABNORMAL LOW (ref >60)
Glucose, Bld: 154 mg/dL — ABNORMAL HIGH (ref 70–99)
Potassium: 4 mmol/L (ref 3.5–5.1)
Sodium: 137 mmol/L (ref 135–145)

## 2022-05-01 LAB — CBC
HCT: 29.4 % — ABNORMAL LOW (ref 36.0–46.0)
Hemoglobin: 9.4 g/dL — ABNORMAL LOW (ref 12.0–15.0)
MCH: 27.2 pg (ref 26.0–34.0)
MCHC: 32 g/dL (ref 30.0–36.0)
MCV: 85.2 fL (ref 80.0–100.0)
Platelets: 262 10*3/uL (ref 150–400)
RBC: 3.45 MIL/uL — ABNORMAL LOW (ref 3.87–5.11)
RDW: 14.7 % (ref 11.5–15.5)
WBC: 14.9 10*3/uL — ABNORMAL HIGH (ref 4.0–10.5)
nRBC: 0 % (ref 0.0–0.2)

## 2022-05-01 LAB — GLUCOSE, CAPILLARY
Glucose-Capillary: 136 mg/dL — ABNORMAL HIGH (ref 70–99)
Glucose-Capillary: 253 mg/dL — ABNORMAL HIGH (ref 70–99)
Glucose-Capillary: 124 mg/dL — ABNORMAL HIGH (ref 70–99)
Glucose-Capillary: 118 mg/dL — ABNORMAL HIGH (ref 70–99)

## 2022-05-01 MED ORDER — ENOXAPARIN SODIUM 40 MG/0.4ML IJ SOSY
40.0000 mg | PREFILLED_SYRINGE | INTRAMUSCULAR | Status: DC
  Administered 2022-05-01: 40 mg via SUBCUTANEOUS
  Filled 2022-05-01: qty 0.4

## 2022-05-01 NOTE — Assessment & Plan Note (Signed)
-   Continue Maxipime with transition to oral at discharge tomorrow.  Patient is clinically improving.  Outpatient urology follow-up

## 2022-05-01 NOTE — Assessment & Plan Note (Signed)
continue statin

## 2022-05-01 NOTE — Plan of Care (Signed)

## 2022-05-01 NOTE — Assessment & Plan Note (Addendum)
Stage I -mobilize to take pressure off the wound

## 2022-05-01 NOTE — Assessment & Plan Note (Signed)
continue PPI

## 2022-05-01 NOTE — Evaluation (Signed)
Physical Therapy Evaluation Patient Details Name: Stephanie [name] MRN: [id number] DOB: 1941-05-03 Today's Date: 05/01/2022  History of Present Illness  Pt is a 81 yo female that was admitted for infected right upper pole renal cyst versus renal abscess, UTI. PMH of anemia, asthma, chronic kidney disease, type 2 diabetes mellitus, atrial fibrillation, GERD, glaucoma, hypertension, dyslipidemia, and vitamin B-12 deficiency, right hydronephrosis and right ureteral stent and that was recently exchanged on 5/9 and a chronic indwelling Foley catheter, remote history of colon cancer and bladder cancer.   Clinical Impression  Patient alert and agreeable to PT with some encouragement. Pt reported at baseline she is modI for ambulation with RW and ADLs (sink bath normally) husband performs IADLs including driving. Primarily household ambulator, Engineer, maintenance for community ambulation. 1 fall in the last 6 months, and is receiving HHPT.  The patient was able to perform supine <> sit with supervision. Sit <> stand with RW and CGA, great technique noted. She ambulated ~33f with RW and CGA as well. Exhibited decreased velocity, cadence, and did fatigue but no LOB noted (steppage gait with L knee hyperextension noted, per pt this is baseline).  Overall the patient demonstrated deficits (see "PT Problem List") that impede the patient's functional abilities, safety, and mobility and would benefit from skilled PT intervention. Recommendation at this time HHPT with intermittent supervision to maximize independence, function, and safety.        Recommendations for follow up therapy are one component of a multi-disciplinary discharge planning process, led by the attending physician.  Recommendations may be updated based on patient status, additional functional criteria and insurance authorization.  Follow Up Recommendations Home health PT      Assistance Recommended at Discharge Intermittent Supervision/Assistance  Patient  can return home with the following  Assistance with cooking/housework;Assist for transportation;Help with stairs or ramp for entrance;Direct supervision/assist for medications management    Equipment Recommendations None recommended by PT  Recommendations for Other Services       Functional Status Assessment Patient has had a recent decline in their functional status and demonstrates the ability to make significant improvements in function in a reasonable and predictable amount of time.     Precautions / Restrictions Precautions Precautions: Fall Restrictions Weight Bearing Restrictions: No      Mobility  Bed Mobility Overal bed mobility: Needs Assistance Bed Mobility: Supine to Sit, Sit to Supine     Supine to sit: Supervision, HOB elevated Sit to supine: Supervision        Transfers Overall transfer level: Needs assistance Equipment used: Rolling walker (2 wheels) Transfers: Sit to/from Stand Sit to Stand: Supervision                Ambulation/Gait Ambulation/Gait assistance: Min guard Gait Distance (Feet): 30 Feet Assistive device: Rolling walker (2 wheels) Gait Pattern/deviations: Steppage, Knee hyperextension - left       General Gait Details: decreased velocity, fatiguing for pt but no LOB no physical assistance needed  Stairs            Wheelchair Mobility    Modified Rankin (Stroke Patients Only)       Balance Overall balance assessment: Needs assistance Sitting-balance support: Feet supported Sitting balance-Leahy Scale: Good     Standing balance support: Reliant on assistive device for balance Standing balance-Leahy Scale: Fair                               Pertinent  Vitals/Pain Pain Assessment Pain Assessment: No/denies pain    Home Living Family/patient expects to be discharged to:: Private residence Living Arrangements: Spouse/significant other Available Help at Discharge: Available 24 hours/day;Family Type  of Home: House Home Access: Stairs to enter   [employer] of Steps: 2 4" steps   Home Layout: One level Home Equipment: Conservation officer, nature (2 wheels);Rollator (4 wheels);Wheelchair - manual      Prior Function Prior Level of Function : Needs assist             Mobility Comments: ambulatory for household distances with RW. WC or scooter for community ambulation/appts. does not drive ADLs Comments: sink bath at baseline, does not need assist for toileting     Hand Dominance   Dominant Hand: Right    Extremity/Trunk Assessment   Upper Extremity Assessment Upper Extremity Assessment: Defer to OT evaluation    Lower Extremity Assessment Lower Extremity Assessment: Generalized weakness;RLE deficits/detail;LLE deficits/detail RLE Deficits / Details: grossly 4-/5 LLE Deficits / Details: grossly 4-/5, Knee extension 3+/5, ankle DF 3+/5       Communication   Communication: HOH  Cognition Arousal/Alertness: Awake/alert Behavior During Therapy: WFL for tasks assessed/performed Overall Cognitive Status: Within Functional Limits for tasks assessed                                          General Comments      Exercises     Assessment/Plan    PT Assessment Patient needs continued PT services  PT Problem List Decreased strength;Decreased mobility;Decreased balance;Decreased activity tolerance       PT Treatment Interventions DME instruction;Therapeutic exercise;Gait training;Balance training;Wheelchair mobility training;Neuromuscular re-education;Functional mobility training;Stair training;Therapeutic activities;Patient/family education    PT Goals (Current goals can be found in the Care Plan section)  Acute Rehab PT Goals Patient Stated Goal: to go home PT Goal Formulation: With patient Time For Goal Achievement: 05/15/22 Potential to Achieve Goals: Good    Frequency Min 2X/week     Co-evaluation               AM-PAC PT "6  Clicks" Mobility  Outcome Measure Help needed turning from your back to your side while in a flat bed without using bedrails?: None Help needed moving from lying on your back to sitting on the side of a flat bed without using bedrails?: None Help needed moving to and from a bed to a chair (including a wheelchair)?: None Help needed standing up from a chair using your arms (e.g., wheelchair or bedside chair)?: None Help needed to walk in hospital room?: A Little Help needed climbing 3-5 steps with a railing? : A Little 6 Click Score: 22    End of Session   Activity Tolerance: Patient tolerated treatment well Patient left: in bed;with call bell/phone within reach;with bed alarm set Nurse Communication: Mobility status PT Visit Diagnosis: Other abnormalities of gait and mobility (R26.89);Difficulty in walking, not elsewhere classified (R26.2);Muscle weakness (generalized) (M62.81)    Time: 2440-1027 PT Time Calculation (min) (ACUTE ONLY): 21 min   Charges:   PT Evaluation $PT Eval Low Complexity: 1 Low PT Treatments $Therapeutic Activity: 8-22 mins        Lieutenant Diego PT, DPT 1:57 PM,05/01/22

## 2022-05-01 NOTE — Assessment & Plan Note (Signed)
-   Present on admission and now resolved.  Continue current blood pressure medicine

## 2022-05-01 NOTE — Assessment & Plan Note (Signed)
-    continue Cardizem CD and Lopressor for rate control

## 2022-05-01 NOTE — Assessment & Plan Note (Signed)
-   Continue sliding scale and Glucotrol.  A1c 6.8

## 2022-05-01 NOTE — Assessment & Plan Note (Signed)
-   Continue IV hydration monitor renal function.  Renal function improving Lab Results  Component Value Date   CREATININE 1.33 (H) 05/01/2022   CREATININE 1.48 (H) 04/30/2022   CREATININE 1.40 (H) 04/29/2022

## 2022-05-01 NOTE — Assessment & Plan Note (Signed)
-   Continue IV cefepime.  urine culture growing mixed species.

## 2022-05-01 NOTE — Assessment & Plan Note (Signed)
-   Urology reviewed the CT scan.  It is consistent with mild right hydronephrosis with chronic indwelling stent in appropriate position exchanged on 5/23 by Dr. Bernardo Heater.  No need to exchange presently per urology as stents appear to be draining

## 2022-05-01 NOTE — Progress Notes (Signed)
  Progress Note   Patient: Stephanie [name] OBS:[id number] DOB: 20-May-1941 DOA: 04/28/2022     3 DOS: the patient was seen and examined on 05/01/2022   Brief hospital course: 81 y.o. female with medical history significant for anemia, asthma, chronic kidney disease, type 2 diabetes mellitus, atrial fibrillation, GERD, glaucoma, hypertension, dyslipidemia, and vitamin B-12 deficiency, right hydronephrosis and right ureteral stent and that was recently exchanged on 5/9 and a chronic indwelling Foley catheter that was exchanged on 03/06/2022 admitted for infected right upper pole renal cyst versus renal abscess  7/30: Urology seen and recommends conservative management with IV antibiotics for now 7/31: Lidocaine patch added last night due to pain control issue 8/1: PT, OT consult    Assessment and Plan: * Infected kidney cyst - Continue Maxipime with transition to oral at discharge tomorrow.  Patient is clinically improving.  Outpatient urology follow-up  UTI (urinary tract infection) - Continue IV cefepime.  urine culture growing mixed species.  Acute unilateral obstructive uropathy - Urology reviewed the CT scan.  It is consistent with mild right hydronephrosis with chronic indwelling stent in appropriate position exchanged on 5/23 by Dr. Bernardo Heater.  No need to exchange presently per urology as stents appear to be draining  Hypertensive urgency - Present on admission and now resolved.  Continue current blood pressure medicine  Acute renal failure superimposed on stage 3b chronic kidney disease ([place]) - Continue IV hydration monitor renal function.  Renal function improving Lab Results  Component Value Date   CREATININE 1.33 (H) 05/01/2022   CREATININE 1.48 (H) 04/30/2022   CREATININE 1.40 (H) 04/29/2022    Paroxysmal atrial fibrillation ([facility]) -  continue Cardizem CD and Lopressor for rate control  Decubitus ulcer of coccyx, unspecified pressure ulcer stage Stage I -mobilize to take pressure  off the wound  GERD without esophagitis - continue PPI   Dyslipidemia -  continue statin   Type II diabetes mellitus with renal manifestations ([facility]) - Continue sliding scale and Glucotrol.  A1c 6.8        Subjective: Sitting in the chair.  Reports 2 loose bowel movement.  Husband at bedside.  Physical Exam: Vitals:   04/30/22 0744 04/30/22 1927 05/01/22 0332 05/01/22 1531  BP: (!) 146/56 (!) 161/66 (!) 152/57 (!) 174/70  Pulse: 69 72 79 65  Resp: '18 20 20 16  '$ Temp: 98 F (36.7 C) 98.6 F (37 C) 99 F (37.2 C) 98.6 F (37 C)  TempSrc: Oral Oral Oral   SpO2: 98% 98% 97% 99%  Weight:      Height:       81 year old female lying in the bed comfortably without any acute distress Eyes pupil equal round and reactive to light and accommodation Lungs clear to auscultation bilaterally Cardiovascular regular rate and rhythm Abdomen soft, benign Neuro alert and oriented, nonfocal Psych normal mood and affect  Data Reviewed:  WBC 14.9, hemoglobin 9.4  Family Communication: Husband updated at bedside  Disposition: Status is: Inpatient Remains inpatient appropriate because: IV antibiotics, ambulation today   Planned Discharge Destination: Home with Home Health    DVT prophylaxis-Lovenox Time spent: 35 minutes  Author: Max Sane, MD 05/01/2022 3:52 PM  For on call review [email or web address].

## 2022-05-01 NOTE — Evaluation (Signed)
Occupational Therapy Evaluation Patient Details Name: Stephanie [name] MRN: [id number] DOB: Mar 06, 1941 Today's Date: 05/01/2022   History of Present Illness Pt is a 81 yo female that was admitted for infected right upper pole renal cyst versus renal abscess, UTI. PMH of anemia, asthma, chronic kidney disease, type 2 diabetes mellitus, atrial fibrillation, GERD, glaucoma, hypertension, dyslipidemia, and vitamin B-12 deficiency, right hydronephrosis and right ureteral stent and that was recently exchanged on 5/9 and a chronic indwelling Foley catheter, remote history of colon cancer and bladder cancer.   Clinical Impression   Patient presenting with decreased Ind in self care,balance, functional mobility/transfers, endurance, and safety awareness. Patient's husband present during session and confirms baseline. Pt is mod I with self care tasks and with use of RW. Pt's husband assists with IADL tasks. She does not drive and ambulates short household distances only. Pt utilizes wheelchair/scooter in community.  Patient currently functioning at supervision - min guard for ambulation to bathroom with use of RW and for toileting needs. Pt able to perform her own hygiene while seated with min guard for standing balance for clothing management. Pt returns back to sit on EOB with husband remaining present in room. Pt does appear to hyperextend L knee with ambulation. She has been given a brace but declines to wear it at baselione. Patient will benefit from acute OT to increase overall independence in the areas of ADLs, functional mobility, and safety awareness in order to safely discharge home with husband.       Recommendations for follow up therapy are one component of a multi-disciplinary discharge planning process, led by the attending physician.  Recommendations may be updated based on patient status, additional functional criteria and insurance authorization.   Follow Up Recommendations  Home health OT     Assistance Recommended at Discharge Intermittent Supervision/Assistance  Patient can return home with the following A little help with walking and/or transfers;A little help with bathing/dressing/bathroom;Assistance with cooking/housework;Assist for transportation;Help with stairs or ramp for entrance    Functional Status Assessment  Patient has had a recent decline in their functional status and demonstrates the ability to make significant improvements in function in a reasonable and predictable amount of time.  Equipment Recommendations  None recommended by OT       Precautions / Restrictions Precautions Precautions: Fall Restrictions Weight Bearing Restrictions: No      Mobility Bed Mobility Overal bed mobility: Needs Assistance Bed Mobility: Supine to Sit     Supine to sit: Supervision, HOB elevated          Transfers Overall transfer level: Needs assistance Equipment used: Rolling walker (2 wheels) Transfers: Sit to/from Stand Sit to Stand: Supervision                  Balance Overall balance assessment: Needs assistance Sitting-balance support: Feet supported Sitting balance-Leahy Scale: Good     Standing balance support: Reliant on assistive device for balance, Bilateral upper extremity supported, During functional activity Standing balance-Leahy Scale: Fair                             ADL either performed or assessed with clinical judgement   ADL Overall ADL's : Needs assistance/impaired                         Toilet Transfer: Min guard;Regular Toilet;Rolling walker (2 wheels)   Toileting- Water quality scientist and Hygiene: Min guard;Sit to/from  stand               Vision Patient Visual Report: No change from baseline              Pertinent Vitals/Pain Pain Assessment Pain Assessment: No/denies pain     Hand Dominance Right   Extremity/Trunk Assessment Upper Extremity Assessment Upper Extremity  Assessment: Generalized weakness;Overall WFL for tasks assessed   Lower Extremity Assessment Lower Extremity Assessment: Defer to PT evaluation RLE Deficits / Details: grossly 4-/5 LLE Deficits / Details: grossly 4-/5, Knee extension 3+/5, ankle DF 3+/5       Communication Communication Communication: HOH   Cognition Arousal/Alertness: Awake/alert Behavior During Therapy: WFL for tasks assessed/performed Overall Cognitive Status: Within Functional Limits for tasks assessed                                                  Home Living Family/patient expects to be discharged to:: Private residence Living Arrangements: Spouse/significant other Available Help at Discharge: Available 24 hours/day;Family Type of Home: House Home Access: Stairs to enter [employer] of Steps: 2 4" steps Entrance Stairs-Rails: None Home Layout: One level     Bathroom Shower/Tub: Tub/shower unit;Walk-in shower   Bathroom Toilet: Standard     Home Equipment: Conservation officer, nature (2 wheels);Rollator (4 wheels);Wheelchair - manual          Prior Functioning/Environment Prior Level of Function : Needs assist             Mobility Comments: ambulatory for household distances with RW. WC or scooter for community ambulation/appts. does not drive ADLs Comments: sink bath at baseline. She is independent in dressing and toileting needs. Husband cooks and does laundry at home.        OT Problem List: Decreased strength;Decreased activity tolerance;Decreased safety awareness;Impaired balance (sitting and/or standing);Decreased knowledge of use of DME or AE      OT Treatment/Interventions: Self-care/ADL training;Therapeutic exercise;Therapeutic activities;Energy conservation;DME and/or AE instruction;Patient/family education;Balance training;Manual therapy    OT Goals(Current goals can be found in the care plan section) Acute Rehab OT Goals Patient Stated Goal: to go home  and resume HH therapy OT Goal Formulation: With patient/family Time For Goal Achievement: 05/15/22 Potential to Achieve Goals: Good ADL Goals Pt Will Perform Grooming: with modified independence;standing Pt Will Perform Lower Body Dressing: with modified independence;sit to/from stand Pt Will Transfer to Toilet: with modified independence;ambulating Pt Will Perform Toileting - Clothing Manipulation and hygiene: with modified independence;sit to/from stand  OT Frequency: Min 2X/week       AM-PAC OT "6 Clicks" Daily Activity     Outcome Measure Help from another person eating meals?: None Help from another person taking care of personal grooming?: None Help from another person toileting, which includes using toliet, bedpan, or urinal?: A Little Help from another person bathing (including washing, rinsing, drying)?: A Little Help from another person to put on and taking off regular upper body clothing?: None Help from another person to put on and taking off regular lower body clothing?: A Little 6 Click Score: 21   End of Session Equipment Utilized During Treatment: Rolling walker (2 wheels) Nurse Communication: Mobility status  Activity Tolerance: Patient tolerated treatment well Patient left: in bed;with call bell/phone within reach;with bed alarm set;with family/visitor present  OT Visit Diagnosis: Unsteadiness on feet (R26.81);Muscle weakness (generalized) (M62.81);History of falling (Z91.81)  Time: 9323-5573 OT Time Calculation (min): 24 min Charges:  OT General Charges $OT Visit: 1 Visit OT Evaluation $OT Eval Moderate Complexity: 1 Mod OT Treatments $Self Care/Home Management : 8-22 mins  Darleen Crocker, MS, OTR/L , CBIS ascom [phone]  05/01/22, 4:04 PM

## 2022-05-01 NOTE — Consult Note (Signed)
PHARMACIST - PHYSICIAN COMMUNICATION  CONCERNING:  Enoxaparin (Lovenox) for DVT Prophylaxis    RECOMMENDATION: Patient was prescribed enoxaprin '40mg'$  q24 hours for VTE prophylaxis.   Filed Weights   04/28/22 1632 04/28/22 2231  Weight: 72.6 kg (160 lb) 68 kg (149 lb 14.6 oz)    Body mass index is 24.2 kg/m.  Estimated Creatinine Clearance: 32.1 mL/min (A) (by C-G formula based on SCr of 1.33 mg/dL (H)).    Patient is candidate for enoxaparin '40mg'$  every 24 hours based on CrCl >55m/min  DESCRIPTION: [facility]  Patient is now receiving enoxaparin 40 mg every 24 hours   CGretel Acre PharmD PGY1 Pharmacy Resident 05/01/2022 3:41 PM

## 2022-05-02 DIAGNOSIS — R509 Fever, unspecified: Secondary | ICD-10-CM | POA: Diagnosis not present

## 2022-05-02 DIAGNOSIS — N139 Obstructive and reflux uropathy, unspecified: Secondary | ICD-10-CM | POA: Diagnosis not present

## 2022-05-02 DIAGNOSIS — N281 Cyst of kidney, acquired: Secondary | ICD-10-CM | POA: Diagnosis not present

## 2022-05-02 DIAGNOSIS — N390 Urinary tract infection, site not specified: Secondary | ICD-10-CM | POA: Diagnosis not present

## 2022-05-02 LAB — BASIC METABOLIC PANEL
Anion gap: 3 — ABNORMAL LOW (ref 5–15)
BUN: 17 mg/dL (ref 8–23)
CO2: 17 mmol/L — ABNORMAL LOW (ref 22–32)
Calcium: 8.2 mg/dL — ABNORMAL LOW (ref 8.9–10.3)
Chloride: 118 mmol/L — ABNORMAL HIGH (ref 98–111)
Creatinine, Ser: 1.26 mg/dL — ABNORMAL HIGH (ref 0.44–1.00)
GFR, Estimated: 43 mL/min — ABNORMAL LOW (ref >60)
Glucose, Bld: 82 mg/dL (ref 70–99)
Potassium: 3.6 mmol/L (ref 3.5–5.1)
Sodium: 138 mmol/L (ref 135–145)

## 2022-05-02 LAB — CBC
HCT: 29.3 % — ABNORMAL LOW (ref 36.0–46.0)
Hemoglobin: 9.3 g/dL — ABNORMAL LOW (ref 12.0–15.0)
MCH: 27.2 pg (ref 26.0–34.0)
MCHC: 31.7 g/dL (ref 30.0–36.0)
MCV: 85.7 fL (ref 80.0–100.0)
Platelets: 263 10*3/uL (ref 150–400)
RBC: 3.42 MIL/uL — ABNORMAL LOW (ref 3.87–5.11)
RDW: 15 % (ref 11.5–15.5)
WBC: 9.9 10*3/uL (ref 4.0–10.5)
nRBC: 0 % (ref 0.0–0.2)

## 2022-05-02 LAB — GLUCOSE, CAPILLARY: Glucose-Capillary: 102 mg/dL — ABNORMAL HIGH (ref 70–99)

## 2022-05-02 MED ORDER — HYDRALAZINE HCL 25 MG PO TABS
25.0000 mg | ORAL_TABLET | Freq: Three times a day (TID) | ORAL | Status: DC
  Administered 2022-05-02: 25 mg via ORAL
  Filled 2022-05-02: qty 1

## 2022-05-02 MED ORDER — CIPROFLOXACIN HCL 500 MG PO TABS: 500.0000 mg | ORAL_TABLET | Freq: Two times a day (BID) | ORAL | 0 refills | Status: AC

## 2022-05-02 NOTE — TOC Transition Note (Signed)
Transition of Care [facility]) - CM/SW Discharge Note   Patient Details  Name: Stephanie [name] MRN: [id number] Date of Birth: 1941-01-24  Transition of Care [facility]) CM/SW Contact:  Beverly Sessions, RN Phone Number: 05/02/2022, 10:31 AM   Clinical Narrative:      Patient to discharge today.  Stephanie [name] with [facility] notified of discharge Referral made to [facility] with [facility] for outpatient palliative     Barriers to Discharge: Continued Medical Work up   Patient Goals and CMS Choice Patient states their goals for this hospitalization and ongoing recovery are:: home with home health CMS Medicare.gov Compare Post Acute Care list provided to:: Patient Represenative (must comment) Choice offered to / list presented to : Spouse  Discharge Placement                       Discharge Plan and Services                                     Social Determinants of Health (SDOH) Interventions     Readmission Risk Interventions    04/29/2022    9:58 AM 02/14/2021    1:28 PM 06/21/2020    1:54 PM  Readmission Risk Prevention Plan  Transportation Screening Complete Complete Complete  PCP or Specialist Appt within 3-5 Days Complete Complete Complete  HRI or Home Care Consult Complete  Complete  Social Work Consult for [place] Planning/Counseling Complete Complete Complete  Palliative Care Screening Not Applicable Not Applicable Not Applicable  Medication Review Press photographer) Complete Complete

## 2022-05-02 NOTE — Progress Notes (Signed)
Discharge instructions were reviewed with pt and family. IV was DC and IVF were stopped. Questions were encourage and answered. Pt denies pain at this time. Belongings were given to pt.

## 2022-05-02 NOTE — Discharge Summary (Signed)
Physician Discharge Summary   Patient: Stephanie [name] MRN: [id number] DOB: Feb 25, 1941  Admit date:     04/28/2022  Discharge date: 05/02/22  Discharge Physician: Max Sane   PCP: Rusty Aus, MD   Recommendations at discharge:   Follow-up with outpatient providers as requested  Discharge Diagnoses: Principal Problem:   Infected kidney cyst Active Problems:   UTI (urinary tract infection)   Acute unilateral obstructive uropathy   Acute renal failure superimposed on stage 3b chronic kidney disease (HCC)   Hypertensive urgency   Paroxysmal atrial fibrillation (HCC)   Type II diabetes mellitus with renal manifestations (HCC)   Dyslipidemia   GERD without esophagitis   Decubitus ulcer of coccyx, unspecified pressure ulcer stage   Fever  Hospital Course: 81 y.o. female with medical history significant for anemia, asthma, chronic kidney disease, type 2 diabetes mellitus, atrial fibrillation, GERD, glaucoma, hypertension, dyslipidemia, and vitamin B-12 deficiency, right hydronephrosis and right ureteral stent and that was recently exchanged on 5/9 and a chronic indwelling Foley catheter that was exchanged on 03/06/2022 admitted for infected right upper pole renal cyst versus renal abscess  7/30: Urology seen and recommends conservative management with IV antibiotics for now 7/31: Lidocaine patch added last night due to pain control issue 8/1: PT, OT consult   Assessment and Plan: * Infected kidney cyst UTI (urinary tract infection) - Complete 2-week course of antibiotic.  Treated with IV antibiotics while in the hospital and discharged on p.o. Cipro.  Urine culture growing mixed species.  Acute unilateral obstructive uropathy - Urology reviewed the CT scan.  It is consistent with mild right hydronephrosis with chronic indwelling stent in appropriate position exchanged on 5/23 by Dr. Bernardo Heater.  No need to exchange presently per urology as stents appear to be draining.  Outpatient  urology follow-up  Hypertensive urgency - Present on admission and now resolved.  Resume home medications at discharge  stage 3b chronic kidney disease ([place]) Renal function at baseline  Paroxysmal atrial fibrillation (HCC) Decubitus ulcer of coccyx, unspecified pressure ulcer stage 1 GERD without esophagitis Dyslipidemia Type II diabetes mellitus with renal manifestations ([place])  A1c 6.8         Consultants: Urology Disposition: Home Diet recommendation:  Discharge Diet Orders (From admission, onward)     Start     Ordered   05/02/22 0000  Diet - low sodium heart healthy        05/02/22 1026           Carb modified diet DISCHARGE MEDICATION: Allergies as of 05/02/2022       Reactions   Infliximab Hives, Shortness Of Breath, Itching   Ibuprofen Itching   Indomethacin Other (See Comments)   headache   Methotrexate Derivatives Other (See Comments)   Headache   Moexipril Other (See Comments)   unknown   Percocet [oxycodone-acetaminophen] Itching   Naprosyn [naproxen] Rash        Medication List     TAKE these medications    acetaminophen 325 MG tablet Commonly known as: TYLENOL Take 2 tablets (650 mg total) by mouth every 6 (six) hours as needed for moderate pain, fever or headache.   ascorbic acid 500 MG tablet Commonly known as: VITAMIN C Take 1 tablet (500 mg total) by mouth daily.   brimonidine 0.2 % ophthalmic solution Commonly known as: ALPHAGAN Place 1 drop into both eyes 2 (two) times daily.   ciprofloxacin 500 MG tablet Commonly known as: Cipro Take 1 tablet (500 mg total) by  mouth 2 (two) times daily for 10 days.   Contour Test test strip Generic drug: glucose blood 1 each by Other route daily.   Cranberry 400 MG Caps Take 400 mg by mouth daily.   cyanocobalamin 1000 MCG tablet Commonly known as: VITAMIN B12 Take 1,000 mcg by mouth daily.   diltiazem 360 MG 24 hr capsule Commonly known as: CARDIZEM CD Take 1 capsule (360 mg  total) by mouth in the morning and at bedtime.   glipiZIDE 10 MG tablet Commonly known as: GLUCOTROL Take 5 mg by mouth 2 (two) times daily before a meal.   leflunomide 10 MG tablet Commonly known as: ARAVA Take 10 mg by mouth every other day.   LIQUID TEARS OP Place 1 drop into both eyes daily as needed (Dry eyes).   Magnesium Oxide 250 MG Tabs Take 250 mg by mouth daily.   metFORMIN 500 MG 24 hr tablet Commonly known as: GLUCOPHAGE-XR Take 500 mg by mouth every evening.   metoprolol tartrate 50 MG tablet Commonly known as: LOPRESSOR Take 50 mg by mouth 2 (two) times daily.   omeprazole 40 MG capsule Commonly known as: PRILOSEC Take 40 mg by mouth daily.   PROBIOTIC-10 PO Take 1 capsule by mouth daily.   rosuvastatin 10 MG tablet Commonly known as: CRESTOR Take 10 mg by mouth daily.   tacrolimus 0.1 % ointment Commonly known as: PROTOPIC Apply 1 application. topically daily as needed (irritation).   triamcinolone 0.025 % ointment Commonly known as: KENALOG Apply 1 application. topically 2 (two) times daily. What changed:  when to take this reasons to take this   Vitron-C 65-125 MG Tabs Generic drug: Iron-Vitamin C Take 1 tablet by mouth daily.        Follow-up Information     Rusty Aus, MD. Schedule an appointment as soon as possible for a visit in 1 week(s).   Specialty: Internal Medicine Why: [facility] Discharge F/UP Contact information: [place] [address] [phone]         Abbie Sons, MD. Schedule an appointment as soon as possible for a visit in 2 week(s).   Specialty: Urology Why: [facility] Discharge F/UP Contact information: [place] [address] [phone]                Discharge Exam: Danley Danker Weights   04/28/22 1632 04/28/22 2231  Weight: 72.6 kg 23 kg   81 year old female lying in the bed comfortably without  any acute distress Eyes pupil equal round and reactive to light and accommodation Lungs clear to auscultation bilaterally Cardiovascular regular rate and rhythm Abdomen soft, benign Neuro alert and oriented, nonfocal Psych normal mood and affect  Condition at discharge: fair  The results of significant diagnostics from this hospitalization (including imaging, microbiology, ancillary and laboratory) are listed below for reference.   Imaging Studies: CT ABDOMEN PELVIS W CONTRAST  Result Date: 04/28/2022 CLINICAL DATA:  Abdominal pain. EXAM: CT ABDOMEN AND PELVIS WITH CONTRAST TECHNIQUE: Multidetector CT imaging of the abdomen and pelvis was performed using the standard protocol following bolus administration of intravenous contrast. RADIATION DOSE REDUCTION: This exam was performed according to the departmental dose-optimization program which includes automated exposure control, adjustment of the mA and/or kV according to patient size and/or use of iterative reconstruction technique. CONTRAST:  41m OMNIPAQUE IOHEXOL 300 MG/ML  SOLN COMPARISON:  CT abdomen pelvis dated 03/09/2022. FINDINGS: Evaluation of this exam is limited  due to respiratory motion artifact as well as streak artifact caused by spinal hardware. Lower chest: There is a 3 mm right middle lobe nodule (3/4). Bibasilar linear atelectasis/scarring. There is coronary vascular calcification. No intra-abdominal free air or free fluid. Hepatobiliary: The liver and gallbladder appear unremarkable Pancreas: There is moderate atrophy of the body and tail of the pancreas. No active inflammatory changes. No dilatation of the main pancreatic duct. Spleen: Normal in size without focal abnormality. Adrenals/Urinary Tract: The adrenal glands are unremarkable. Similar positioning of the pigtail right ureteral stent with proximal tip in the renal pelvis and distal end within the urinary bladder. There is however interval development of a mild right  hydronephrosis since the prior CT. There is apparent urothelial thickening and enhancement concerning for pyelonephritis. Correlation with urinalysis recommended. There is a 2 cm hypodense lesion in the mid to upper pole of the right kidney, which may represent an infected cyst. Abscess is less likely but not excluded. There is no hydronephrosis on the left. Several bilateral subcentimeter hypodense lesions are too small to characterize. The urinary bladder is predominantly collapsed around a Foley catheter. Small pockets of air within the bladder likely introduced via the catheter. Mild diffuse bladder wall thickening may be related to underdistention or cystitis. Stomach/Bowel: Postsurgical changes of the bowel with anastomotic suture in the region of the rectosigmoid. There is no bowel obstruction or active inflammation. The appendix is not visualized with certainty. No inflammatory changes identified in the right lower quadrant. Vascular/Lymphatic: Advanced aortoiliac atherosclerotic disease. The IVC is unremarkable. No portal venous gas. There is no adenopathy. Reproductive: Hysterectomy.  No adnexal masses. Other: Midline vertical anterior pelvic wall incisional scar. Musculoskeletal: Extensive degenerative changes of the spine and lumbar laminectomy and fusion hardware. No acute osseous pathology. IMPRESSION: 1. Interval development of a mild right hydronephrosis since the prior CT and findings of pyelonephritis. Correlation with urinalysis recommended. 2. A 2 cm hypodense lesion in the mid to upper pole of the right kidney may represent an infected cyst. Abscess is less likely but not excluded. 3. No bowel obstruction. 4. There is a 3 mm right middle lobe nodule. No follow-up needed if patient is low-risk.This recommendation follows the consensus statement: Guidelines for Management of Incidental Pulmonary Nodules Detected on CT Images: From the Fleischner Society 2017; Radiology 2017; 284:228-243. 5. Aortic  Atherosclerosis (ICD10-I70.0). Electronically Signed   By: Anner Crete M.D.   On: 04/28/2022 20:21   DG Chest 2 View  Result Date: 04/28/2022 CLINICAL DATA:  Fever.  Patient does not feel well. EXAM: CHEST - 2 VIEW COMPARISON:  March 09, 2022 FINDINGS: The cardiomediastinal silhouette is stable. No pneumothorax. No nodules or masses. Mild haziness at the left base was not appreciated on the previous study. No other acute abnormalities. IMPRESSION: Mild haziness at the left base could represent early developing infiltrate/pneumonia in the appropriate clinical setting. No other interval changes or acute abnormalities. Electronically Signed   By: Dorise Bullion III M.D.   On: 04/28/2022 18:06    Microbiology: Results for orders placed or performed during the hospital encounter of 04/28/22  SARS Coronavirus 2 by RT PCR (hospital order, performed in [facility] hospital lab) *cepheid single result test* Anterior Nasal Swab     Status: None   Collection Time: 04/28/22  5:52 PM   Specimen: Anterior Nasal Swab  Result Value Ref Range Status   SARS Coronavirus 2 by RT PCR NEGATIVE NEGATIVE Final    Comment: (NOTE) SARS-CoV-2 target nucleic acids  are NOT DETECTED.  The SARS-CoV-2 RNA is generally detectable in upper and lower respiratory specimens during the acute phase of infection. The lowest concentration of SARS-CoV-2 viral copies this assay can detect is 250 copies / mL. A negative result does not preclude SARS-CoV-2 infection and should not be used as the sole basis for treatment or other patient management decisions.  A negative result may occur with improper specimen collection / handling, submission of specimen other than nasopharyngeal swab, presence of viral mutation(s) within the areas targeted by this assay, and inadequate number of viral copies (<250 copies / mL). A negative result must be combined with clinical observations, patient history, and epidemiological information.  Fact  Sheet for Patients:   https://www.patel.info/  Fact Sheet for Healthcare Providers: https://hall.com/  This test is not yet approved or  cleared by the [place] FDA and has been authorized for detection and/or diagnosis of SARS-CoV-2 by FDA under an Emergency Use Authorization (EUA).  This EUA will remain in effect (meaning this test can be used) for the duration of the COVID-19 declaration under Section 564(b)(1) of the Act, 21 U.S.C. section 360bbb-3(b)(1), unless the authorization is terminated or revoked sooner.  Performed at [facility], [address] [place], [place] [address]   Blood culture (routine x 2)     Status: None (Preliminary result)   Collection Time: 04/28/22  6:23 PM   Specimen: Left Antecubital; Blood  Result Value Ref Range Status   Specimen Description LEFT ANTECUBITAL  Final   Special Requests   Final    BOTTLES DRAWN AEROBIC AND ANAEROBIC Blood Culture results may not be optimal due to an excessive volume of blood received in culture bottles   Culture   Final    NO GROWTH 4 DAYS Performed at [facility], [address] [place], [place] [address]    Report Status PENDING  Incomplete  Blood culture (routine x 2)     Status: None (Preliminary result)   Collection Time: 04/28/22  6:23 PM   Specimen: BLOOD  Result Value Ref Range Status   Specimen Description BLOOD RIGHT ANTECUBITAL  Final   Special Requests   Final    BOTTLES DRAWN AEROBIC AND ANAEROBIC Blood Culture results may not be optimal due to an inadequate volume of blood received in culture bottles   Culture   Final    NO GROWTH 4 DAYS Performed at [facility], [address] [place], [place] [address]    Report Status PENDING  Incomplete  Urine Culture     Status: Abnormal   Collection Time: 04/28/22  6:23 PM   Specimen: Urine, Clean Catch  Result Value Ref Range Status   Specimen Description   Final     URINE, CLEAN CATCH Performed at [facility], [address], [place], [place] [address]    Special Requests   Final    NONE Performed at [facility], [address] [place],  [address]    Culture MULTIPLE SPECIES PRESENT, SUGGEST RECOLLECTION (A)  Final   Report Status 04/30/2022 FINAL  Final    Labs: CBC: Recent Labs  Lab 04/28/22 1634 04/29/22 0335 04/30/22 0524 05/01/22 0407 05/02/22 0415  WBC 17.3* 14.7* 12.7* 14.9* 9.9  HGB 9.9* 9.4* 9.3* 9.4* 9.3*  HCT 31.7* 29.7* 29.5* 29.4* 29.3*  MCV 85.7 85.6 85.8 85.2 85.7  PLT 310 257 230 262 364   Basic Metabolic Panel: Recent Labs  Lab 04/28/22 1634 04/29/22 0335 04/30/22 0524  05/01/22 0407 05/02/22 0415  NA 135 136 139 137 138  K 4.1 3.4* 3.3* 4.0 3.6  CL 110 109 115* 114* 118*  CO2 19* 20* 16* 16* 17*  GLUCOSE 157* 128* 154* 154* 82  BUN 24* '19 18 18 17  '$ CREATININE 1.48* 1.40* 1.48* 1.33* 1.26*  CALCIUM 8.9 8.2* 8.4* 8.6* 8.2*  MG  --  1.4* 1.9  --   --    Liver Function Tests: Recent Labs  Lab 04/28/22 1634  AST 23  ALT 17  ALKPHOS 153*  BILITOT 0.5  PROT 6.8  ALBUMIN 3.0*   CBG: Recent Labs  Lab 05/01/22 0754 05/01/22 1135 05/01/22 1631 05/01/22 2109 05/02/22 0814  GLUCAP 136* 253* 124* 118* 102*    Discharge time spent: greater than 30 minutes.  Signed: Max Sane, MD [facility] 05/02/2022

## 2022-05-02 NOTE — Plan of Care (Signed)

## 2022-05-02 NOTE — Progress Notes (Signed)
[facility] Liaison Note  Notified by TOC/Stephanie of patient/family request of [facility] Paliative services.  [facility] hospital liaison will follow patient for discharge disposition.   Please call with any questions/concerns.    Thank you for the opportunity to participate in this patient's care.   Daphene Calamity, MSW [facility] Liaison  [phone]

## 2022-05-02 NOTE — Care Management Important Message (Signed)
Important Message  Patient Details  Name: Stephanie [name] MRN: [id number] Date of Birth: 07/09/1941   Medicare Important Message Given:  Yes  Reviewed Medicare IM with spouse via the room phone due to isolation status.  Aware of Medicare right to appeal discharge.  Declined copy at this time.   Dannette Barbara 05/02/2022, 10:20 AM

## 2022-05-03 LAB — CULTURE, BLOOD (ROUTINE X 2)
Culture: NO GROWTH
Culture: NO GROWTH

## 2022-05-04 ENCOUNTER — Telehealth: Payer: Self-pay | Admitting: Primary Care

## 2022-05-04 NOTE — Telephone Encounter (Signed)
Spoke with husband about the Palliative referral and he requested to talk more about this with Dr. [name] Heck on Monday 8/7, patient has an appointment with him at that time.  Husband was in agreement with me calling him back on Tues. to f/u with him to see if they want to schedule.

## 2022-05-08 ENCOUNTER — Ambulatory Visit: Payer: Medicare Other | Admitting: Physician Assistant

## 2022-05-08 ENCOUNTER — Other Ambulatory Visit: Payer: Self-pay | Admitting: Physician Assistant

## 2022-05-08 DIAGNOSIS — N281 Cyst of kidney, acquired: Secondary | ICD-10-CM

## 2022-05-08 DIAGNOSIS — N1330 Unspecified hydronephrosis: Secondary | ICD-10-CM

## 2022-05-09 ENCOUNTER — Ambulatory Visit: Payer: Medicare Other | Admitting: Urology

## 2022-05-15 ENCOUNTER — Other Ambulatory Visit: Payer: Medicare Other | Admitting: Primary Care

## 2022-05-16 ENCOUNTER — Other Ambulatory Visit: Payer: Medicare Other | Admitting: Primary Care

## 2022-05-16 DIAGNOSIS — R5381 Other malaise: Secondary | ICD-10-CM

## 2022-05-16 DIAGNOSIS — E1121 Type 2 diabetes mellitus with diabetic nephropathy: Secondary | ICD-10-CM

## 2022-05-16 DIAGNOSIS — N1832 Chronic kidney disease, stage 3b: Secondary | ICD-10-CM

## 2022-05-16 DIAGNOSIS — Z515 Encounter for palliative care: Secondary | ICD-10-CM

## 2022-05-16 NOTE — Progress Notes (Signed)
[facility] Palliative Care Consult Note Telephone: [phone]  Fax: [phone]   Date of encounter: 05/16/22 10:14 AM PATIENT NAME: Stephanie [name] [address] [place] [address]   [phone] (home)  DOB: [birth year] MRN: [id number] PRIMARY CARE PROVIDER:    Rusty Aus, MD,  [place] [address] [phone]  REFERRING PROVIDER:   Rusty Aus, MD [facility] [place],  [place] [address] [phone]  RESPONSIBLE PARTY:    Contact Information     Name Relation Home Work Mobile   MERIA, CRILLY Spouse [phone]     Kendal Hymen [phone]  [phone]        I met face to face with patient and family in  home. Palliative Care was asked to follow this patient by consultation request of  Rusty Aus, MD to address advance care planning and complex medical decision making. This is the initial visit.                                     ASSESSMENT AND PLAN / RECOMMENDATIONS:   Advance Care Planning/Goals of Care: Goals include to maximize quality of life and symptom management. Patient/health care surrogate gave his/her permission to discuss.Our advance care planning conversation included a discussion about:    The value and importance of advance care planning  Experiences with loved ones who have been seriously ill or have died  Exploration of personal, cultural or spiritual beliefs that might influence medical decisions  Exploration of goals of care in the event of a sudden injury or illness  Identification of a healthcare agent - husband Review  of an  advance directive document  CODE STATUS: voices  DNR, will complete MOST on next visit.  Symptom Management/Plan:  Nutrition: Eats 100% of recent quantity. Has h/o of 170's, 156 lbs now. No edema.  Mobility: Uses walker in home, currently having PT currently. Denies  falls. Has bathroom adaptive equipment.   Pain: Etiology RA, occasional, rx with ibuprofen, continue.   DM: On oral hypoglycemics, reported HbA1C =7.5%, husband endorses tighter diet control.   ADL: endorses doing own care but will not shower.   Renal: GFR recent stage 3 CKD, Has indwelling urinary cath due to retention, follows with urology.  Follow up Palliative Care Visit: Palliative care will continue to follow for complex medical decision making, advance care planning, and clarification of goals. Return 8 weeks or prn.  I spent 50 minutes providing this consultation. More than 50% of the time in this consultation was spent in counseling and care coordination.  PPS: 40%  HOSPICE ELIGIBILITY/DIAGNOSIS: TBD  Chief Complaint: debility, reduced mobility, urinary retention.  HISTORY OF PRESENT ILLNESS:  Stephanie [name] is a 81 y.o. year old female  with debility, RA, indwelling urinary cath, limited mobility. . Patient seen today to review palliative care needs to include medical decision making and advance care planning as appropriate.   History obtained from review of EMR, discussion with primary team, and interview with family, facility staff/caregiver and/or [name].  I reviewed available labs, medications, imaging, studies and related documents from the EMR.  Records reviewed and summarized above.   ROS   General: NAD ENMT: denies dysphagia Pulmonary: denies cough, denies increased SOB Abdomen: endorses good appetite, denies constipation, endorses continence of bowel GU: denies dysuria, endorses continence of  urin, indwelling urinary cath MSK:  denies increased weakness,  no falls reported Skin: denies rashes or wounds Neurological: denies pain, denies insomnia Psych: Endorses positive mood Heme/lymph/immuno: denies bruises, abnormal bleeding  Physical Exam: Current and past weights:156 lbs Constitutional: NAD General: frail appearing, wnwd  EYES: anicteric sclera, lids  intact, no discharge  ENMT: hard of  hearing, oral mucous membranes moist, dentition intact CV: no LE edema Pulmonary: no increased work of breathing, no cough, room air Abdomen: intake 100% no ascites GU: deferred UXN:ATFT sarcopenia, moves all extremities, ambulatory with walker  Skin: warm and dry, no rashes or wounds on visible skin Neuro:  no generalized weakness,  mild  cognitive impairment Psych: non-anxious affect, A and O x 3 Hem/lymph/immuno: no widespread bruising CURRENT PROBLEM LIST:  Patient Active Problem List   Diagnosis Date Noted   Fever    Decubitus ulcer of coccyx, unspecified pressure ulcer stage 04/29/2022   Infected kidney cyst 04/28/2022   Acute unilateral obstructive uropathy 04/28/2022   Acute renal failure superimposed on stage 3b chronic kidney disease ([place]) 04/28/2022   Dyslipidemia 04/28/2022   GERD without esophagitis 04/28/2022   Hypertensive urgency 04/28/2022   Hypokalemia 03/12/2022   Sepsis secondary to acute pyelonephritis(HCC) 03/09/2022   Indwelling Foley catheter present 03/09/2022   Hyperkalemia 07/25/2021   Hydronephrosis of right kidney 07/25/2021   Type II diabetes mellitus with renal manifestations (HCC)    Normocytic anemia    Asthma    Acute renal failure superimposed on stage 3a chronic kidney disease ([place])    UTI (urinary tract infection)    Abnormal computed tomography of large intestine    Polyp of descending colon    S/p exchange of ureteral stent 02/06/22 03/22/2021   Aortic atherosclerosis ([place]) 02/23/2021   Stage 3b chronic kidney disease ([place]) 02/14/2021   Proteinuria 08/18/2020   UPJ obstruction, acquired 08/18/2020   Anemia secondary to renal failure 07/20/2020   CKD (chronic kidney disease) stage 5, GFR less than 15 ml/min (HCC)    Ileus (HCC)    Paroxysmal atrial fibrillation ([place])    Goals of care, counseling/discussion    Palliative care by specialist    SBO (small bowel obstruction) ([place])    Pressure injury of  skin 06/19/2020   Acquired thrombophilia ([place]) 06/19/2020   On continuous oral anticoagulation 06/19/2020   Acute pyelonephritis 06/18/2020   Hydronephrosis, right 06/18/2020   Lactic acid acidosis 06/18/2020   Hyperglycemia 06/18/2020   Hx of essential hypertension 06/18/2020   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/18/2020   Atrial fibrillation with rapid ventricular response ([place])    Essential hypertension    Pure hypercholesterolemia    Diabetes mellitus ([place]) 06/17/2020   Rheumatoid arthritis ([place]) 06/17/2020   COVID-19 virus infection 06/17/2020   AKI (acute kidney injury) ([place]) 06/17/2020   Chronic left SI joint pain 04/19/2020   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T[place]) 04/19/2020   Chronic radicular lumbar pain 04/19/2020   Lumbar spondylosis 04/19/2020   Chronic pain syndrome 04/19/2020   Sepsis ([place]) 07/05/2019   CAP (community acquired pneumonia) 07/05/2019   Gastroenteritis 07/05/2019   DM type 2 with diabetic mixed hyperlipidemia ([place]) 04/30/2018   Elevated serum creatinine 10/16/2017   Rheumatoid arthritis involving multiple sites with positive rheumatoid factor ([place]) 08/13/2016   Hyperlipidemia, mixed 04/18/2016   Gross hematuria 11/01/2015   History of colon cancer 10/18/2014   Squamous cell metaplasia of urinary bladder 04/29/2014   Osteoarthritis of knee 04/01/2014   B-complex deficiency 03/03/2014  Benign essential hypertension 01/16/2014   Unspecified thoracic, thoracolumbar and lumbosacral intervertebral disc disorder 01/16/2014   Malignant neoplasm of colon ([place]) 01/16/2014   Bladder neoplasm of uncertain malignant potential 09/14/2013   Nocturia 08/05/2013   Scoliosis (and kyphoscoliosis), idiopathic 06/24/2013   Chronic cystitis 05/04/2013   Increased frequency of urination 05/04/2013   Incomplete emptying of bladder 05/04/2013   PAST MEDICAL HISTORY:  Active Ambulatory Problems    Diagnosis Date Noted   B-complex deficiency  03/03/2014   Benign essential hypertension 01/16/2014   Bladder neoplasm of uncertain malignant potential 09/14/2013   Chronic cystitis 05/04/2013   DM type 2 with diabetic mixed hyperlipidemia ([place]) 04/30/2018   Sepsis ([place]) 07/05/2019   CAP (community acquired pneumonia) 07/05/2019   Gastroenteritis 07/05/2019   Unspecified thoracic, thoracolumbar and lumbosacral intervertebral disc disorder 01/16/2014   Squamous cell metaplasia of urinary bladder 04/29/2014   Scoliosis (and kyphoscoliosis), idiopathic 06/24/2013   Rheumatoid arthritis involving multiple sites with positive rheumatoid factor ([place]) 08/13/2016   Osteoarthritis of knee 04/01/2014   Nocturia 08/05/2013   Malignant neoplasm of colon ([place]) 01/16/2014   Increased frequency of urination 05/04/2013   Incomplete emptying of bladder 05/04/2013   Hyperlipidemia, mixed 04/18/2016   History of colon cancer 10/18/2014   Gross hematuria 11/01/2015   Elevated serum creatinine 10/16/2017   Chronic left SI joint pain 04/19/2020   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T9-[place]) 04/19/2020   Chronic radicular lumbar pain 04/19/2020   Lumbar spondylosis 04/19/2020   Chronic pain syndrome 04/19/2020   Diabetes mellitus ([place]) 06/17/2020   Rheumatoid arthritis ([place]) 06/17/2020   COVID-19 virus infection 06/17/2020   AKI (acute kidney injury) ([place]) 06/17/2020   Atrial fibrillation with rapid ventricular response ([place])    Essential hypertension    Pure hypercholesterolemia    Acute pyelonephritis 06/18/2020   Hydronephrosis, right 06/18/2020   Lactic acid acidosis 06/18/2020   Hyperglycemia 06/18/2020   Hx of essential hypertension 06/18/2020   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/18/2020   Pressure injury of skin 06/19/2020   Acquired thrombophilia ([place]) 06/19/2020   On continuous oral anticoagulation 06/19/2020   SBO (small bowel obstruction) (HCC)    Goals of care, counseling/discussion    Palliative care by specialist     CKD (chronic kidney disease) stage 5, GFR less than 15 ml/min (HCC)    Ileus (HCC)    Paroxysmal atrial fibrillation (HCC)    Stage 3b chronic kidney disease ([place]) 02/14/2021   Abnormal computed tomography of large intestine    Polyp of descending colon    Anemia secondary to renal failure 07/20/2020   Aortic atherosclerosis ([place]) 02/23/2021   S/p exchange of ureteral stent 02/06/22 03/22/2021   Proteinuria 08/18/2020   UPJ obstruction, acquired 08/18/2020   Hyperkalemia 07/25/2021   Hydronephrosis of right kidney 07/25/2021   Type II diabetes mellitus with renal manifestations (HCC)    Normocytic anemia    Asthma    Acute renal failure superimposed on stage 3a chronic kidney disease ([place])    UTI (urinary tract infection)    Sepsis secondary to acute pyelonephritis(HCC) 03/09/2022   Indwelling Foley catheter present 03/09/2022   Hypokalemia 03/12/2022   Infected kidney cyst 04/28/2022   Acute unilateral obstructive uropathy 04/28/2022   Acute renal failure superimposed on stage 3b chronic kidney disease ([place]) 04/28/2022   Dyslipidemia 04/28/2022   GERD without esophagitis 04/28/2022   Hypertensive urgency 04/28/2022   Decubitus ulcer of coccyx, unspecified pressure ulcer stage 04/29/2022   Fever  Resolved Ambulatory Problems    Diagnosis Date Noted   No Resolved Ambulatory Problems   Past Medical History:  Diagnosis Date   Anemia    Arthritis    Cancer ([place]) 1996   Chronic kidney disease    Complication of anesthesia    Diabetes mellitus without complication ([place])    Dysrhythmia    Foley catheter in place    GERD (gastroesophageal reflux disease)    Glaucoma    Hyperlipidemia    Hypertension    Lumbar disc disease    Pneumonia    PONV (postoperative nausea and vomiting)    Small bowel obstruction (HCC)    Urinary retention    Uses walker    Vitamin B 12 deficiency    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    SOCIAL HX:  Social History   Tobacco Use   Smoking  status: Never   Smokeless tobacco: Never  Substance Use Topics   Alcohol use: No   FAMILY HX:  Family History  Problem Relation Age of Onset   Breast cancer Neg Hx       ALLERGIES:  Allergies  Allergen Reactions   Infliximab Hives, Shortness Of Breath and Itching   Ibuprofen Itching   Indomethacin Other (See Comments)    headache   Methotrexate Derivatives Other (See Comments)    Headache   Moexipril Other (See Comments)    unknown   Percocet [Oxycodone-Acetaminophen] Itching   Naprosyn [Naproxen] Rash     PERTINENT MEDICATIONS:  Outpatient Encounter Medications as of 05/16/2022  Medication Sig   acetaminophen (TYLENOL) 325 MG tablet Take 2 tablets (650 mg total) by mouth every 6 (six) hours as needed for moderate pain, fever or headache.   ascorbic acid (VITAMIN C) 500 MG tablet Take 1 tablet (500 mg total) by mouth daily.   brimonidine (ALPHAGAN) 0.2 % ophthalmic solution Place 1 drop into both eyes 2 (two) times daily.   CONTOUR TEST test strip 1 each by Other route daily.    Cranberry 400 MG CAPS Take 400 mg by mouth daily.    diltiazem (CARDIZEM CD) 360 MG 24 hr capsule Take 1 capsule (360 mg total) by mouth in the morning and at bedtime.   glipiZIDE (GLUCOTROL) 10 MG tablet Take 5 mg by mouth 2 (two) times daily before a meal.   Iron-Vitamin C (VITRON-C) 65-125 MG TABS Take 1 tablet by mouth daily.   leflunomide (ARAVA) 10 MG tablet Take 10 mg by mouth every other day.   Magnesium Oxide 250 MG TABS Take 250 mg by mouth daily.   metFORMIN (GLUCOPHAGE-XR) 500 MG 24 hr tablet Take 500 mg by mouth every evening.   metoprolol tartrate (LOPRESSOR) 50 MG tablet Take 50 mg by mouth 2 (two) times daily.   omeprazole (PRILOSEC) 40 MG capsule Take 40 mg by mouth daily.   Polyvinyl Alcohol (LIQUID TEARS OP) Place 1 drop into both eyes daily as needed (Dry eyes).   Probiotic Product (PROBIOTIC-10 PO) Take 1 capsule by mouth daily.   rosuvastatin (CRESTOR) 10 MG tablet Take 10 mg by  mouth daily.    tacrolimus (PROTOPIC) 0.1 % ointment Apply 1 application. topically daily as needed (irritation).   triamcinolone (KENALOG) 0.025 % ointment Apply 1 application. topically 2 (two) times daily. (Patient taking differently: Apply 1 application  topically 2 (two) times daily as needed (irritation).)   vitamin B-12 (CYANOCOBALAMIN) 1000 MCG tablet Take 1,000 mcg by mouth daily.   No facility-administered encounter medications on  file as of 05/16/2022.   Thank you for the opportunity to participate in the care of [name].  The palliative care team will continue to follow. Please call our office at [phone] if we can be of additional assistance.   Jason Coop, NP , DNP, AGPCNP-BC  COVID-19 PATIENT SCREENING TOOL Asked and negative response unless otherwise noted:  Have you had symptoms of covid, tested positive or been in contact with someone with symptoms/positive test in the past 5-10 days?

## 2022-05-17 ENCOUNTER — Ambulatory Visit
Admission: RE | Admit: 2022-05-17 | Discharge: 2022-05-17 | Disposition: A | Discharge status: Home / Self Care | Payer: Medicare Other | Source: Ambulatory Visit | Attending: Physician Assistant | Admitting: Physician Assistant

## 2022-05-17 DIAGNOSIS — N1330 Unspecified hydronephrosis: Secondary | ICD-10-CM | POA: Diagnosis present

## 2022-05-17 DIAGNOSIS — N281 Cyst of kidney, acquired: Secondary | ICD-10-CM | POA: Insufficient documentation

## 2022-05-21 ENCOUNTER — Ambulatory Visit (INDEPENDENT_AMBULATORY_CARE_PROVIDER_SITE_OTHER): Payer: Medicare Other | Admitting: Physician Assistant

## 2022-05-21 DIAGNOSIS — R339 Retention of urine, unspecified: Secondary | ICD-10-CM | POA: Diagnosis not present

## 2022-05-21 DIAGNOSIS — N135 Crossing vessel and stricture of ureter without hydronephrosis: Secondary | ICD-10-CM

## 2022-05-21 DIAGNOSIS — N281 Cyst of kidney, acquired: Secondary | ICD-10-CM

## 2022-05-21 NOTE — Progress Notes (Signed)
05/21/2022 10:06 AM   [facility] [birth year] [id number]  CC: Chief Complaint  Patient presents with   Urinary Retention   HPI: Stephanie [name] is a 81 y.o. 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   Today she reports feeling much better after her recent hospitalization.  She denies any persistent fevers, flank pain, or abdominal pain.  Renal ultrasound dated 05/17/2022 showed 2 persistent simple right renal cysts without evidence of abscess.  She has stable mild right hydronephrosis with ureteral stent in place.  Creatinine stable ~1.3.  PMH: Past Medical History:  Diagnosis Date   Anemia    Arthritis    Asthma    Cancer ([place]) 1996   colon   Chronic kidney disease    Complication of anesthesia    nausea   Diabetes mellitus without complication ([place])    Dysrhythmia    Foley catheter in place    GERD (gastroesophageal reflux disease)    Glaucoma    Hyperlipidemia    Hypertension    Lumbar disc disease    Pneumonia    PONV (postoperative nausea and vomiting)    Small bowel obstruction (HCC)    Urinary retention    Uses walker    Vitamin B 12 deficiency    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    has, does not wear    Surgical History: Past Surgical History:  Procedure Laterality Date   ABDOMINAL HYSTERECTOMY     BACK SURGERY     x5  "screws and rods"   BREAST BIOPSY Right 11/01/2021   rt br biopsy calcs ribbon clip/ benign 2nd site LIQ   BREAST BIOPSY Right 11/01/2021   rt br stereo calcs coil clip 1st site / benign UIQ   COLON SURGERY     COLONOSCOPY WITH PROPOFOL N/A 05/22/2017   Procedure: COLONOSCOPY WITH PROPOFOL;  Surgeon: Manya Silvas, MD;  Location: [facility] ENDOSCOPY;   Service: Endoscopy;  Laterality: N/A;   COLONOSCOPY WITH PROPOFOL N/A 06/08/2021   Procedure: COLONOSCOPY WITH PROPOFOL;  Surgeon: Lucilla Lame, MD;  Location: [place] [facility];  Service: Endoscopy;  Laterality: N/A;  Diabetic   CYSTOSCOPY W/ URETERAL STENT PLACEMENT  07/25/2021   Procedure: CYSTOSCOPY WITH RETROGRADE PYELOGRAM/URETERAL STENT PLACEMENT;  Surgeon: Abbie Sons, MD;  Location: [facility] ORS;  Service: Urology;;   CYSTOSCOPY W/ URETERAL STENT PLACEMENT  02/06/2022   Procedure: CYSTOSCOPY WITH RETROGRADE PYELOGRAM/URETERAL STENT EXCHANGE;  Surgeon: Abbie Sons, MD;  Location: [facility] ORS;  Service: Urology;;   CYSTOSCOPY WITH STENT PLACEMENT Right 08/02/2020   Procedure: CYSTOSCOPY WITH STENT EXCHANGE;  Surgeon: Abbie Sons, MD;  Location: [facility];  Service: Urology;  Laterality: Right;   CYSTOSCOPY WITH STENT PLACEMENT Right 06/18/2020   Procedure: CYSTOSCOPY WITH STENT PLACEMENT;  Surgeon: Irine Seal, MD;  Location: [facility];  Service: Urology;  Laterality: Right;   EYE SURGERY Bilateral    Cataracts   FRACTURE SURGERY Right 2011   foot surgery   JOINT REPLACEMENT     KNEE ARTHROSCOPY Bilateral    LAPAROTOMY N/A 06/30/2020   Procedure: EXPLORATORY LAPAROTOMY;  Surgeon: Jules Husbands, MD;  Location: [facility];  Service: General;  Laterality: N/A;   LUMBAR LAMINECTOMY     POLYPECTOMY  06/08/2021   Procedure:  POLYPECTOMY;  Surgeon: Lucilla Lame, MD;  Location: [place];  Service: Endoscopy;;   TEMPORARY DIALYSIS CATHETER N/A 06/27/2020   Procedure: TEMPORARY DIALYSIS CATHETER;  Surgeon: Katha Cabal, MD;  Location: [facility];  Service: Cardiovascular;  Laterality: N/A;    Home Medications:  Allergies as of 05/21/2022       Reactions   Infliximab Hives, Shortness Of Breath, Itching   Ibuprofen Itching   Indomethacin Other (See Comments)   headache   Methotrexate Derivatives Other (See Comments)   Headache   Moexipril Other (See  Comments)   unknown   Percocet [oxycodone-acetaminophen] Itching   Naprosyn [naproxen] Rash        Medication List        Accurate as of May 21, 2022 10:06 AM. If you have any questions, ask your nurse or doctor.          acetaminophen 325 MG tablet Commonly known as: TYLENOL Take 2 tablets (650 mg total) by mouth every 6 (six) hours as needed for moderate pain, fever or headache.   ascorbic acid 500 MG tablet Commonly known as: VITAMIN C Take 1 tablet (500 mg total) by mouth daily.   brimonidine 0.2 % ophthalmic solution Commonly known as: ALPHAGAN Place 1 drop into both eyes 2 (two) times daily.   Contour Test test strip Generic drug: glucose blood 1 each by Other route daily.   Cranberry 400 MG Caps Take 400 mg by mouth daily.   cyanocobalamin 1000 MCG tablet Commonly known as: VITAMIN B12 Take 1,000 mcg by mouth daily.   diltiazem 360 MG 24 hr capsule Commonly known as: CARDIZEM CD Take 1 capsule (360 mg total) by mouth in the morning and at bedtime.   glipiZIDE 10 MG tablet Commonly known as: GLUCOTROL Take 5 mg by mouth 2 (two) times daily before a meal.   leflunomide 10 MG tablet Commonly known as: ARAVA Take 10 mg by mouth every other day.   LIQUID TEARS OP Place 1 drop into both eyes daily as needed (Dry eyes).   Magnesium Oxide 250 MG Tabs Take 250 mg by mouth daily.   metFORMIN 500 MG 24 hr tablet Commonly known as: GLUCOPHAGE-XR Take 500 mg by mouth every evening.   metoprolol tartrate 50 MG tablet Commonly known as: LOPRESSOR Take 50 mg by mouth 2 (two) times daily.   omeprazole 40 MG capsule Commonly known as: PRILOSEC Take 40 mg by mouth daily.   PROBIOTIC-10 PO Take 1 capsule by mouth daily.   rosuvastatin 10 MG tablet Commonly known as: CRESTOR Take 10 mg by mouth daily.   tacrolimus 0.1 % ointment Commonly known as: PROTOPIC Apply 1 application. topically daily as needed (irritation).   triamcinolone 0.025 %  ointment Commonly known as: KENALOG Apply 1 application. topically 2 (two) times daily. What changed:  when to take this reasons to take this   Vitron-C 65-125 MG Tabs Generic drug: Iron-Vitamin C Take 1 tablet by mouth daily.        Allergies:  Allergies  Allergen Reactions   Infliximab Hives, Shortness Of Breath and Itching   Ibuprofen Itching   Indomethacin Other (See Comments)    headache   Methotrexate Derivatives Other (See Comments)    Headache   Moexipril Other (See Comments)    unknown   Percocet [Oxycodone-Acetaminophen] Itching   Naprosyn [Naproxen] Rash    Family History: Family History  Problem Relation Age of Onset   Breast cancer Neg Hx  Social History:   reports that she has never smoked. She has never used smokeless tobacco. She reports that she does not drink alcohol and does not use drugs.  Physical Exam: There were no vitals taken for this visit.  Constitutional:  Alert and oriented, no acute distress, nontoxic appearing HEENT: [place], AT Cardiovascular: No clubbing, cyanosis, or edema Respiratory: Normal respiratory effort, no increased work of breathing Skin: No rashes, bruises or suspicious lesions Neurologic: Grossly intact, no focal deficits, moving all 4 extremities Psychiatric: Normal mood and affect  Pertinent Imaging: Results for orders placed during the hospital encounter of 05/17/22  US RENAL  Narrative CLINICAL DATA:  Right renal cyst versus abscess. Right hydronephrosis. Right ureteral stent.  EXAM: RENAL / URINARY TRACT ULTRASOUND COMPLETE  COMPARISON:  CT scan of the abdomen and pelvis April 28, 2022.  FINDINGS: Right Kidney:  Renal measurements: 9.9 x 4.0 x 5.2 cm = volume: 109.4 mL. There is mild persistent right hydronephrosis. A right ureteral stent is identified. 2 cysts are identified in the right kidney measuring 12 mm in the lower pole and 28 mm in the midpole. No evidence of abscess on today's imaging. No  follow-up imaging recommended for the cysts.  Left Kidney:  Renal measurements: 10.8 x 4.2 x 4.2 cm = volume: 100 mL. Contains 2 cysts with the largest measuring 10 mm. No follow-up imaging recommended for the cyst.  Bladder:  Decompressed with a Foley catheter.  Other:  None.  IMPRESSION: 1. No evidence of renal abscess on today's study. 2. Mild persistent right hydronephrosis. A right ureteral stent remains in place. 3. The bladder is decompressed with a Foley catheter and not well evaluated.   Electronically Signed By: Dorise Bullion III M.D. On: 05/18/2022 14:35  I personally reviewed the images referenced above and note persistent mild right hydronephrosis with right renal cysts.  Cath Change/ Replacement  Patient is present today for a catheter change due to urinary retention.  57m of water was removed from the balloon, a 14FR foley cath was removed without difficulty.  Patient was cleaned and prepped in a sterile fashion with betadine. A 16 FR foley cath was replaced into the bladder no complications were noted Urine return was noted 166mand urine was yellow in color. The balloon was filled with 1058mf sterile water. A leg bag was attached for drainage.  A night bag was also given to the patient. Patient tolerated well.    Performed by: SamDebroah LoopA-C   Assessment & Plan:   1. UPJ obstruction, acquired Stable mild right hydronephrosis despite right ureteral stent.  Her renal function stable and she denies renal colic.  No indication for early stent exchange at this time.  We will continue with periodic stent exchanges with Dr. StoBernardo Heaterr his plan.  2. Renal cyst, right No evidence of renal abscess.  Stable right renal cysts.  We discussed no further monitoring was necessary for these.  3. Urinary retention Foley changed above.  Return in about 4 weeks (around 06/18/2022) for Catheter exchange.  SamDebroah LoopA-C  [facility] [id number]uiParkersburgrBrown StationC [phone]

## 2022-06-20 NOTE — Progress Notes (Unsigned)
Cath Change/ Replacement  Patient is present today for a catheter change due to urinary retention.  8 ml of water was removed from the balloon, a 16 FR foley cath was removed without difficulty.  Patient was cleaned and prepped in a sterile fashion with betadine and 2% lidocaine jelly was instilled into the urethra. A 16 FR foley cath was replaced into the bladder, no complications were noted. Urine return was noted 20 ml and urine was yellow in color. The balloon was filled with 10ml of sterile water. A leg bag was attached for drainage.  A night bag was also given to the patient and patient was given instruction on how to change from one bag to another. Patient was given proper instruction on catheter care.    Performed by: Michiel Cowboy, PA-C   Follow up: One month follow for Foley exchange.

## 2022-06-21 ENCOUNTER — Encounter: Payer: Medicare Other | Admitting: Urology

## 2022-06-21 ENCOUNTER — Ambulatory Visit (INDEPENDENT_AMBULATORY_CARE_PROVIDER_SITE_OTHER): Payer: Medicare Other | Admitting: Urology

## 2022-06-21 DIAGNOSIS — Z978 Presence of other specified devices: Secondary | ICD-10-CM

## 2022-06-21 DIAGNOSIS — R339 Retention of urine, unspecified: Secondary | ICD-10-CM | POA: Diagnosis not present

## 2022-07-11 ENCOUNTER — Other Ambulatory Visit: Payer: Medicare Other | Admitting: Primary Care

## 2022-07-11 DIAGNOSIS — Z515 Encounter for palliative care: Secondary | ICD-10-CM

## 2022-07-11 NOTE — Progress Notes (Signed)
    [facility] Consult Note Telephone: [phone]  Fax: [phone]    Date of encounter: 07/11/22 9:27 AM PATIENT NAME: Stephanie [name] [address] [place] [address]   [id number] (home)  DOB: 31-Jul-1941 MRN: [id number] PRIMARY CARE PROVIDER:    Rusty Aus, MD,  [place] [address] [phone]  REFERRING PROVIDER:   Rusty Aus, MD [place] [facility] [place],  [place] [address] [phone]  RESPONSIBLE PARTY:    Contact Information     Name Relation Home Work Mobile   ROSY, ESTABROOK Spouse [phone]     Kendal Hymen [phone]  [phone]       Due to the COVID-19 crisis, this visit was done via telemedicine from my office and it was initiated and consent by this patient and or family.  I connected with  [place] PR[facility]Y on 07/11/22 by a audio enabled telemedicine application and verified that I am speaking with the correct person using two identifiers.   I discussed the limitations of evaluation and management by telemedicine. The patient expressed understanding and agreed to proceed.   Palliative Care was asked to follow this patient by consultation request of  Rusty Aus, MD to address advance care planning and complex medical decision making. This is a follow up visit.                                   ASSESSMENT AND PLAN / RECOMMENDATIONS:   Advance Care Planning/Goals of Care: Goals include to maximize quality of life and symptom management. Patient/health care surrogate gave his/her permission to discuss. CODE STATUS:FULL  Symptom Management/Plan:  Patient is doing well, [place] for [place] day yesterday and enjoyed the fall colors. I informed husband of end of home visit PC program. He said they were doing fine, went out most days for lunch, and had no complaints. He voiced  understanding to f/u with PCP. He did not have any questions or requests for additional services at this time.  I spent 5 minutes providing this consultation. More than 50% of the time in this consultation was spent in counseling and care coordination.  PPS: 50%  HOSPICE ELIGIBILITY/DIAGNOSIS:  no  Chief Complaint: dementia  HISTORY OF PRESENT ILLNESS:  Stephanie [name] is a 81 y.o. year old female  with dementia . Patient seen today to review palliative care needs to include medical decision making and advance care planning as appropriate.   History obtained from review of EMR, discussion with primary team, and interview with family, facility staff/caregiver and/or Ms. Stephanie [name].  I reviewed available labs, medications, imaging, studies and related documents from the EMR.  Records reviewed and summarized above.   ROS  deferred  Physical Exam: deferred  Thank you for the opportunity to participate in the care of Ms. Stephanie [name].    Jason Coop, NP DNP, AGPCNP-BC

## 2022-07-12 ENCOUNTER — Other Ambulatory Visit: Payer: Medicare Other | Admitting: Primary Care

## 2022-07-18 NOTE — Progress Notes (Signed)
Cath Change/ Replacement  Patient is present today for a catheter change due to urinary retention.  9 ml of water was removed from the balloon, a 16 FR foley cath was removed without difficulty.  Patient was cleaned and prepped in a sterile fashion with betadine and 2% lidocaine jelly was instilled into the urethra. A 16 FR foley cath was replaced into the bladder, no complications were noted. Urine return was noted 5 ml and urine was yellow, cloudy in color. The balloon was filled with 81m of sterile water. A ;eg bag was attached for drainage.  A night bag was also given to the patient and patient was given instruction on how to change from one bag to another. Patient was given proper instruction on catheter care.    Performed by: SZara Council PA-C   Follow up: Keep appointment w/ Dr. SBernardo Heateron 08/09/2022

## 2022-07-19 ENCOUNTER — Ambulatory Visit (INDEPENDENT_AMBULATORY_CARE_PROVIDER_SITE_OTHER): Payer: Medicare Other | Admitting: Urology

## 2022-07-19 DIAGNOSIS — Z978 Presence of other specified devices: Secondary | ICD-10-CM

## 2022-07-19 DIAGNOSIS — R339 Retention of urine, unspecified: Secondary | ICD-10-CM | POA: Diagnosis not present

## 2022-08-09 ENCOUNTER — Encounter: Payer: Self-pay | Admitting: Urology

## 2022-08-09 ENCOUNTER — Ambulatory Visit (INDEPENDENT_AMBULATORY_CARE_PROVIDER_SITE_OTHER): Payer: Medicare Other | Admitting: Urology

## 2022-08-09 VITALS — BP 177/77 | HR 64 | Ht 66.0 in | Wt 149.0 lb

## 2022-08-09 DIAGNOSIS — N135 Crossing vessel and stricture of ureter without hydronephrosis: Secondary | ICD-10-CM | POA: Diagnosis not present

## 2022-08-09 DIAGNOSIS — R339 Retention of urine, unspecified: Secondary | ICD-10-CM | POA: Diagnosis not present

## 2022-08-09 NOTE — Progress Notes (Signed)
08/09/2022 12:03 PM   Stephanie [name] [birth year] [id number]  Referring provider: Rusty Aus, MD [place] [facility] [place],  [place] [address]  Chief Complaint  Patient presents with   Recurrent UTI    Urologic history: 1.  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  Chronic urinary retention Indwelling Foley   HPI: 81 y.o. female presents for 2-monthfollow-up.  Stent last exchanged 02/06/2022 ED visit 03/10/2022 with temp 102.4 and admitted for UTI.  CT showed stent in good position and no hydronephrosis Admitted 04/29/2022 with UTI/fever and CT showing abscess versus infected renal cyst She responded to IV antibiotic therapy and follow-up ultrasound showed mild hydronephrosis and no evidence of abscess and simple renal cysts. 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    has, does not wear    Surgical History: Past Surgical History:  Procedure Laterality Date   ABDOMINAL HYSTERECTOMY     BACK SURGERY     x5  "screws and rods"   BREAST BIOPSY Right 11/01/2021   rt br biopsy calcs ribbon clip/ benign 2nd site LIQ   BREAST BIOPSY Right 11/01/2021   rt br stereo calcs coil clip 1st site / benign UIQ   COLON SURGERY     COLONOSCOPY WITH PROPOFOL N/A 05/22/2017   Procedure: COLONOSCOPY WITH PROPOFOL;  Surgeon: EManya Silvas MD;  [facility]   Service: Endoscopy;  Laterality: N/A;   COLONOSCOPY WITH PROPOFOL N/A 06/08/2021   Procedure: COLONOSCOPY WITH PROPOFOL;  Surgeon: WLucilla Lame MD;  [facility]  Service: Endoscopy;  Laterality: N/A;  Diabetic   CYSTOSCOPY W/ URETERAL STENT PLACEMENT  07/25/2021   Procedure: CYSTOSCOPY WITH RETROGRADE PYELOGRAM/URETERAL STENT PLACEMENT;  Surgeon: SAbbie Sons MD;  Location: [facility];  Service: Urology;;   CYSTOSCOPY W/ URETERAL STENT PLACEMENT  02/06/2022   Procedure: CYSTOSCOPY WITH RETROGRADE PYELOGRAM/URETERAL STENT EXCHANGE;  Surgeon: SAbbie Sons MD;  Location: [facility];  Service: Urology;;   CYSTOSCOPY WITH STENT PLACEMENT Right 08/02/2020   Procedure: CYSTOSCOPY WITH STENT EXCHANGE;  Surgeon: SAbbie Sons MD;  Location: [facility];  Service: Urology;  Laterality: Right;   CYSTOSCOPY WITH STENT PLACEMENT Right 06/18/2020   Procedure: CYSTOSCOPY WITH STENT PLACEMENT;  Surgeon: WIrine Seal MD;  Location: [facility];  Service: Urology;  Laterality: Right;   EYE SURGERY Bilateral    Cataracts   FRACTURE SURGERY Right 2011   foot surgery   JOINT REPLACEMENT     KNEE ARTHROSCOPY Bilateral    LAPAROTOMY N/A 06/30/2020   Procedure: EXPLORATORY LAPAROTOMY;  Surgeon: PJules Husbands MD;  Location: [facility];  Service: [name];  Laterality: N/A;   LUMBAR LAMINECTOMY     POLYPECTOMY  06/08/2021   Procedure: POLYPECTOMY;  Surgeon: Lucilla Lame, MD;  Location: [place];  Service: Endoscopy;;   TEMPORARY DIALYSIS CATHETER N/A 06/27/2020   Procedure: TEMPORARY DIALYSIS CATHETER;  Surgeon: Katha Cabal, MD;  Location: [facility];  Service: Cardiovascular;  Laterality: N/A;    Home Medications:  Allergies as of 08/09/2022       Reactions   Infliximab Hives, Shortness Of Breath, Itching   Ibuprofen Itching   Indomethacin Other (See Comments)   headache   Methotrexate Derivatives Other (See Comments)   Headache   Moexipril Other (See  Comments)   unknown   Percocet [oxycodone-acetaminophen] Itching   Naprosyn [naproxen] Rash        Medication List        Accurate as of August 09, 2022 12:03 PM. If you have any questions, ask your nurse or doctor.          acetaminophen 325 MG tablet Commonly known as: TYLENOL Take 2 tablets (650 mg total) by mouth every 6 (six) hours as needed for moderate pain, fever or headache.   ascorbic acid 500 MG tablet Commonly known as: VITAMIN C Take 1 tablet (500 mg total) by mouth daily.   brimonidine 0.2 % ophthalmic solution Commonly known as: ALPHAGAN Place 1 drop into both eyes 2 (two) times daily.   Contour Test test strip Generic drug: glucose blood 1 each by Other route daily.   Cranberry 400 MG Caps Take 400 mg by mouth daily.   cyanocobalamin 1000 MCG tablet Commonly known as: VITAMIN B12 Take 1,000 mcg by mouth daily.   diltiazem 360 MG 24 hr capsule Commonly known as: CARDIZEM CD Take 1 capsule (360 mg total) by mouth in the morning and at bedtime.   glipiZIDE 10 MG tablet Commonly known as: GLUCOTROL Take 5 mg by mouth 2 (two) times daily before a meal.   leflunomide 10 MG tablet Commonly known as: ARAVA Take 10 mg by mouth every other day.   LIQUID TEARS OP Place 1 drop into both eyes daily as needed (Dry eyes).   Magnesium Oxide 250 MG Tabs Take 250 mg by mouth daily.   metFORMIN 500 MG 24 hr tablet Commonly known as: GLUCOPHAGE-XR Take 500 mg by mouth every evening.   metoprolol tartrate 50 MG tablet Commonly known as: LOPRESSOR Take 50 mg by mouth 2 (two) times daily.   omeprazole 40 MG capsule Commonly known as: PRILOSEC Take 40 mg by mouth daily.   PROBIOTIC-10 PO Take 1 capsule by mouth daily.   rosuvastatin 10 MG tablet Commonly known as: CRESTOR Take 10 mg by mouth daily.   tacrolimus 0.1 % ointment Commonly known as: PROTOPIC Apply 1 application. topically daily as needed (irritation).   triamcinolone 0.025 %  ointment Commonly known as: KENALOG Apply 1 application. topically 2 (two) times daily. What changed:  when to take this reasons to take this   Vitron-C 65-125 MG Tabs Generic drug: Iron-Vitamin C Take 1 tablet by mouth daily.        Allergies:  Allergies  Allergen Reactions   Infliximab Hives, Shortness Of Breath and Itching   Ibuprofen Itching   Indomethacin Other (See Comments)    headache   Methotrexate Derivatives Other (See Comments)    Headache   Moexipril Other (See Comments)    unknown   Percocet [Oxycodone-Acetaminophen] Itching   Naprosyn [Naproxen] Rash    Family History: Family History  Problem Relation Age of Onset   Breast cancer  Neg Hx     Social History:  reports that she has never smoked. She has never used smokeless tobacco. She reports that she does not drink alcohol and does not use drugs.   Physical Exam: BP (!) 177/77   Pulse 64   Ht '5\' 6"'$  (1.676 m)   Wt 149 lb (67.6 kg)   BMI 24.05 kg/m   Constitutional:  Alert and oriented, No acute distress. HEENT: [place] AT Respiratory: Normal respiratory effort, no increased work of breathing. Psychiatric: Normal mood and affect.   Assessment & Plan:    1.  Chronic right UPJ obstruction KUB to assess for stent encrustation Schedule cystoscopy with stent exchange within the next 3 months   Abbie Sons, MD  [facility] [address], [place], [place] [address] [phone]

## 2022-08-14 ENCOUNTER — Ambulatory Visit
Admission: RE | Admit: 2022-08-14 | Discharge: 2022-08-14 | Disposition: A | Discharge status: Home / Self Care | Payer: Medicare Other | Source: Ambulatory Visit | Attending: Urology | Admitting: Urology

## 2022-08-14 ENCOUNTER — Ambulatory Visit
Admission: RE | Admit: 2022-08-14 | Discharge: 2022-08-14 | Disposition: A | Discharge status: Home / Self Care | Payer: Medicare Other | Attending: Urology | Admitting: Urology

## 2022-08-14 DIAGNOSIS — N135 Crossing vessel and stricture of ureter without hydronephrosis: Secondary | ICD-10-CM | POA: Diagnosis present

## 2022-08-21 ENCOUNTER — Ambulatory Visit (INDEPENDENT_AMBULATORY_CARE_PROVIDER_SITE_OTHER): Payer: Medicare Other | Admitting: Urology

## 2022-08-21 DIAGNOSIS — R339 Retention of urine, unspecified: Secondary | ICD-10-CM | POA: Diagnosis not present

## 2022-08-21 DIAGNOSIS — Z978 Presence of other specified devices: Secondary | ICD-10-CM

## 2022-08-21 NOTE — Progress Notes (Signed)
Cath Change/ Replacement  Patient is present today for a catheter change due to urinary retention.  8 ml of water was removed from the balloon, a 16 FR foley cath was removed without difficulty.  Patient was cleaned and prepped in a sterile fashion with betadine and 2% lidocaine jelly was instilled into the urethra. A 16  FR foley cath was replaced into the bladder, no complications were noted.  The balloon was filled with 72m of sterile water. A leg bag was attached for drainage.  Patient was given proper instruction on catheter care.    Performed by: SZara Council PA-C   Follow up: One month for Foley catheter exchange.  In February, she will be due for cysto/stent exchange

## 2022-09-19 ENCOUNTER — Ambulatory Visit: Payer: Medicare Other | Admitting: Urology

## 2022-10-04 ENCOUNTER — Other Ambulatory Visit: Payer: Self-pay | Admitting: Physician Assistant

## 2022-10-04 ENCOUNTER — Ambulatory Visit (INDEPENDENT_AMBULATORY_CARE_PROVIDER_SITE_OTHER): Payer: Medicare Other | Admitting: Physician Assistant

## 2022-10-04 DIAGNOSIS — R339 Retention of urine, unspecified: Secondary | ICD-10-CM | POA: Diagnosis not present

## 2022-10-04 DIAGNOSIS — Z466 Encounter for fitting and adjustment of urinary device: Secondary | ICD-10-CM

## 2022-10-04 DIAGNOSIS — N135 Crossing vessel and stricture of ureter without hydronephrosis: Secondary | ICD-10-CM

## 2022-10-04 NOTE — Progress Notes (Signed)
Surgical Physician Order Form [facility] [place]  Dr. Bernardo Heater * Scheduling expectation :  1 month  *Length of Case:   *Clearance needed: no  *Anticoagulation Instructions: N/A  *Aspirin Instructions: N/A  *Post-op visit Date/Instructions:   1 month cath change  *Diagnosis:  Right UPJ obstruction  *Procedure: right  Cysto w/stent exchange (95844)   Additional orders: N/A  -Admit type: OUTpatient  -Anesthesia: General  -VTE Prophylaxis Standing Order SCD's       Other:   -Standing Lab Orders Per Anesthesia    Lab other: UA&Urine Culture  -Standing Test orders EKG/Chest x-ray per Anesthesia       Test other:   - Medications:  Cipro '400mg'$  IV  -Other orders:  N/A

## 2022-10-04 NOTE — Progress Notes (Signed)
Cath Change/ Replacement  Patient is present today for a catheter change due to urinary retention.  15m of water was removed from the balloon, a 102CHsilicone foley cath was removed without difficulty.  Patient was cleaned and prepped in a sterile fashion with betadine. A 16 FR foley cath was replaced into the bladder, no complications were noted. Urine return was noted 345mand urine was yellow in color. The balloon was filled with 1064mf sterile water. A leg bag was attached for drainage.  A night bag was also given to the patient. Patient tolerated well.    Performed by: SamDebroah LoopA-C and JesGaspar ColaMA  Follow up: Return in about 4 weeks (around 11/01/2022) for Stent change and cystoscopy with Dr. StoBernardo Heater

## 2022-10-05 ENCOUNTER — Telehealth: Payer: Self-pay

## 2022-10-05 NOTE — Progress Notes (Signed)
   [facility] From  Surgery Date: Date: 11/06/2022  Surgeon: Dr. John Giovanni, MD  Inpt ( No  )   Outpt (Yes)   Obs ( No  )   Diagnosis: N13.5 Right Ureteropelvic Junction Obstruction  -CPT: 45859  Surgery: Right Cystoscopy with Stent Exchange  Stop Anticoagulations: N/A  Cardiac/Medical/Pulmonary Clearance needed: no  *Orders entered into EPIC  Date: 10/05/22   *Case booked in EPIC  Date: 10/05/22  *Notified pt of Surgery: Date: 10/05/22  PRE-OP UA & CX: yes, will obtain in clinic on 10/26/2022  *Placed into Prior Authorization Work Fabio Bering Date: 10/05/22  Assistant/laser/rep:No

## 2022-10-05 NOTE — Telephone Encounter (Signed)
I spoke with [name]. We have discussed possible surgery dates and Tuesday February 06th, 2024 was agreed upon by all parties. Patient given information about surgery date, what to expect pre-operatively and post operatively.  We discussed that a Pre-Admission Testing office will be calling to set up the pre-op visit that will take place prior to surgery, and that these appointments are typically done over the phone with a Pre-Admissions RN. Informed patient that our office will communicate any additional care to be provided after surgery. Patients questions or concerns were discussed during our call. Advised to call our office should there be any additional information, questions or concerns that arise. Patient verbalized understanding.

## 2022-10-09 ENCOUNTER — Other Ambulatory Visit: Payer: Self-pay | Admitting: Internal Medicine

## 2022-10-09 DIAGNOSIS — R1084 Generalized abdominal pain: Secondary | ICD-10-CM

## 2022-10-09 DIAGNOSIS — E440 Moderate protein-calorie malnutrition: Secondary | ICD-10-CM

## 2022-10-09 DIAGNOSIS — R634 Abnormal weight loss: Secondary | ICD-10-CM

## 2022-10-19 ENCOUNTER — Ambulatory Visit
Admission: RE | Admit: 2022-10-19 | Discharge: 2022-10-19 | Disposition: A | Discharge status: Home / Self Care | Payer: Medicare Other | Source: Ambulatory Visit | Attending: Internal Medicine | Admitting: Internal Medicine

## 2022-10-19 DIAGNOSIS — E440 Moderate protein-calorie malnutrition: Secondary | ICD-10-CM

## 2022-10-19 DIAGNOSIS — R634 Abnormal weight loss: Secondary | ICD-10-CM | POA: Diagnosis present

## 2022-10-19 DIAGNOSIS — R1084 Generalized abdominal pain: Secondary | ICD-10-CM | POA: Insufficient documentation

## 2022-10-23 ENCOUNTER — Telehealth: Payer: Self-pay | Admitting: *Deleted

## 2022-10-23 NOTE — Telephone Encounter (Signed)
Incoming call from Dr. [name] office asking that Dr. Bernardo Heater take a look at pt's CT scan dated 10/19/22. I advised them that pt has appt on Friday 10/26/22 with Sam and stent exchange on 11/06/22.

## 2022-10-26 ENCOUNTER — Encounter
Admission: RE | Admit: 2022-10-26 | Discharge: 2022-10-26 | Disposition: A | Discharge status: Home / Self Care | Payer: Medicare Other | Source: Ambulatory Visit | Attending: Urology | Admitting: Urology

## 2022-10-26 ENCOUNTER — Ambulatory Visit (INDEPENDENT_AMBULATORY_CARE_PROVIDER_SITE_OTHER): Payer: Medicare Other | Admitting: Physician Assistant

## 2022-10-26 VITALS — Ht 66.0 in | Wt 141.0 lb

## 2022-10-26 VITALS — BP 156/77 | HR 69 | Wt 141.0 lb

## 2022-10-26 DIAGNOSIS — Z01818 Encounter for other preprocedural examination: Secondary | ICD-10-CM

## 2022-10-26 DIAGNOSIS — N135 Crossing vessel and stricture of ureter without hydronephrosis: Secondary | ICD-10-CM

## 2022-10-26 DIAGNOSIS — E1121 Type 2 diabetes mellitus with diabetic nephropathy: Secondary | ICD-10-CM

## 2022-10-26 HISTORY — DX: Atherosclerosis of aorta: I70.0

## 2022-10-26 HISTORY — DX: Chronic kidney disease, stage 3b: N18.32

## 2022-10-26 HISTORY — DX: Rheumatoid arthritis with rheumatoid factor of multiple sites without organ or systems involvement: M05.79

## 2022-10-26 HISTORY — DX: Secondary hyperparathyroidism of renal origin: N25.81

## 2022-10-26 LAB — URINALYSIS, COMPLETE
Bilirubin, UA: NEGATIVE
Ketones, UA: NEGATIVE
Nitrite, UA: NEGATIVE
Specific Gravity, UA: 1.02 (ref 1.005–1.030)
Urobilinogen, Ur: 0.2 mg/dL (ref 0.2–1.0)
pH, UA: 5.5 (ref 5.0–7.5)

## 2022-10-26 LAB — MICROSCOPIC EXAMINATION: WBC, UA: >30 /hpf — AB (ref 0–5)

## 2022-10-26 NOTE — Patient Instructions (Addendum)
Your procedure is scheduled on: Tuesday November 06, 2022. Report to Day Surgery inside [facility] 2nd floor, stop by registration desk before getting on elevator. To find out your arrival time please call [phone] between 1PM - 3PM on Monday November 05, 2022.  Remember: Instructions that are not followed completely may result in serious medical risk,  up to and including death, or upon the discretion of your surgeon and anesthesiologist your  surgery may need to be rescheduled.     _X__ 1. Do not eat food or drink fluids after midnight the night before your procedure.                 No chewing gum or hard candies.   __X__2.  On the morning of surgery brush your teeth with toothpaste and water, you                may rinse your mouth with mouthwash if you wish.  Do not swallow any toothpaste or mouthwash.     _X__ 3.  No Alcohol for 24 hours before or after surgery.   _X__ 4.  Do Not Smoke or use e-cigarettes For 24 Hours Prior to Your Surgery.                 Do not use any chewable tobacco products for at least 6 hours prior to                 Surgery.  _X__  5.  Do not use any recreational drugs (marijuana, cocaine, heroin, ecstasy, MDMA or other)                For at least one week prior to your surgery.  Combination of these drugs with anesthesia                May have life threatening results.  ____  6.  Bring all medications with you on the day of surgery if instructed.   __X_ 7.  Notify your doctor if there is any change in your medical condition      (cold, fever, infections).     Do not wear jewelry, make-up, hairpins, clips or nail polish. Do not wear lotions, powders, or perfumes. You may wear deodorant. Do not shave 48 hours prior to surgery. Men may shave face and neck. Do not bring valuables to the hospital.    [facility] is not responsible for any belongings or valuables.  Contacts, dentures or bridgework may not be worn into  surgery. Leave your suitcase in the car. After surgery it may be brought to your room. For patients admitted to the hospital, discharge time is determined by your treatment team.   Patients discharged the day of surgery will not be allowed to drive home.   Make arrangements for someone to be with you for the first 24 hours of your Same Day Discharge.   __X__ Take these medicines the morning of surgery with A SIP OF WATER:    1. diltiazem (CARDIZEM CD) 360 MG   2. metoprolol tartrate (LOPRESSOR) 50 MG   3. omeprazole (PRILOSEC) 40 MG   4.   5.  6.  ____ Fleet Enema (as directed)   ____ Use CHG Soap (or wipes) as directed  ____ Use Benzoyl Peroxide Gel as instructed  __X__ Use inhalers on the day of surgery albuterol (VENTOLIN HFA) 108 (90 Base) MCG/ACT inhaler   ____ Stop metformin 2 days prior to surgery  __X__ Take 1/2 of usual insulin dose the night before surgery. insulin detemir (LEVEMIR) 100 UNIT/ML injection  No insulin the morning of surgery.   ____ Call your PCP, cardiologist, or Pulmonologist if taking Coumadin/Plavix/aspirin and ask when to stop before your surgery.   __X__ One Week prior to surgery- Stop Anti-inflammatories such as Ibuprofen, Aleve, Advil, Motrin, meloxicam (MOBIC), diclofenac, etodolac, ketorolac, Toradol, Daypro, piroxicam, Goody's or BC powders. OK TO USE TYLENOL IF NEEDED   __X__ Stop supplements until after surgery. ascorbic acid (VITAMIN C), Cranberry, Iron-Vitamin C (VITRON-C), Magnesium Oxide and vitamin B-12 (CYANOCOBALAMIN )  ____ Bring C-Pap to the hospital.    If you have any questions regarding your pre-procedure instructions,  Please call Pre-admit Testing at [phone]

## 2022-10-26 NOTE — Progress Notes (Signed)
Cath Change/ Replacement  Patient is present today for a catheter change due to urinary retention.  8 ml of water was removed from the balloon, a 16 FR foley cath was removed without difficulty.  Patient was cleaned and prepped in a sterile fashion with betadine. A 16 FR foley cath was replaced into the bladder, no complications were noted. Urine return was noted a few drops and urine was yellow in color. The balloon was filled with 5m of sterile water. A leg bag was attached for drainage.  A night bag was also given to the patient . Patient tolerated well.  Performed by: Tausha Milhoan RMA  Additional note: Urine sample obtained from new catheter for preop UA/culture. I reviewed her recent CT findings of increased RT hydro and discussed with Dr. SBernardo Heater Will plan to add on right retrograde pyelogram intra-op for further evaluation; patient notified and agrees.  Follow up: scheduled for 11/06/22

## 2022-10-31 ENCOUNTER — Other Ambulatory Visit: Payer: Self-pay

## 2022-10-31 LAB — CULTURE, URINE COMPREHENSIVE

## 2022-10-31 MED ORDER — AMOXICILLIN-POT CLAVULANATE 875-125 MG PO TABS: 1.0000 | ORAL_TABLET | Freq: Two times a day (BID) | ORAL | 0 refills | Status: DC

## 2022-10-31 NOTE — Progress Notes (Signed)
Per Zara Council Please let Stephanie [name] know that she has a positive urine culture and she needs to start Augmentin 875/125 twice daily for 10 days.   Patient advised and RX sent in

## 2022-11-06 ENCOUNTER — Ambulatory Visit: Payer: Medicare Other | Admitting: Anesthesiology

## 2022-11-06 ENCOUNTER — Ambulatory Visit: Payer: Medicare Other

## 2022-11-06 ENCOUNTER — Encounter
Admission: RE | Disposition: A | Discharge status: Home / Self Care | Payer: Self-pay | Source: Home / Self Care | Attending: Urology

## 2022-11-06 ENCOUNTER — Encounter: Payer: Self-pay | Admitting: Urology

## 2022-11-06 ENCOUNTER — Ambulatory Visit
Admission: RE | Admit: 2022-11-06 | Discharge: 2022-11-06 | Disposition: A | Discharge status: Home / Self Care | Payer: Medicare Other | Attending: Urology | Admitting: Urology

## 2022-11-06 ENCOUNTER — Other Ambulatory Visit: Payer: Self-pay

## 2022-11-06 DIAGNOSIS — N131 Hydronephrosis with ureteral stricture, not elsewhere classified: Secondary | ICD-10-CM | POA: Diagnosis present

## 2022-11-06 DIAGNOSIS — I129 Hypertensive chronic kidney disease with stage 1 through stage 4 chronic kidney disease, or unspecified chronic kidney disease: Secondary | ICD-10-CM | POA: Diagnosis not present

## 2022-11-06 DIAGNOSIS — Z7984 Long term (current) use of oral hypoglycemic drugs: Secondary | ICD-10-CM | POA: Insufficient documentation

## 2022-11-06 DIAGNOSIS — N1330 Unspecified hydronephrosis: Secondary | ICD-10-CM

## 2022-11-06 DIAGNOSIS — Z466 Encounter for fitting and adjustment of urinary device: Secondary | ICD-10-CM | POA: Diagnosis not present

## 2022-11-06 DIAGNOSIS — N1832 Chronic kidney disease, stage 3b: Secondary | ICD-10-CM | POA: Diagnosis not present

## 2022-11-06 DIAGNOSIS — Z79899 Other long term (current) drug therapy: Secondary | ICD-10-CM | POA: Diagnosis not present

## 2022-11-06 DIAGNOSIS — E1122 Type 2 diabetes mellitus with diabetic chronic kidney disease: Secondary | ICD-10-CM | POA: Diagnosis not present

## 2022-11-06 DIAGNOSIS — N135 Crossing vessel and stricture of ureter without hydronephrosis: Secondary | ICD-10-CM

## 2022-11-06 DIAGNOSIS — K219 Gastro-esophageal reflux disease without esophagitis: Secondary | ICD-10-CM | POA: Insufficient documentation

## 2022-11-06 DIAGNOSIS — E1121 Type 2 diabetes mellitus with diabetic nephropathy: Secondary | ICD-10-CM

## 2022-11-06 DIAGNOSIS — J45909 Unspecified asthma, uncomplicated: Secondary | ICD-10-CM | POA: Diagnosis not present

## 2022-11-06 HISTORY — PX: CYSTOSCOPY W/ RETROGRADES: SHX1426

## 2022-11-06 HISTORY — PX: CYSTOSCOPY W/ URETERAL STENT PLACEMENT: SHX1429

## 2022-11-06 LAB — GLUCOSE, CAPILLARY
Glucose-Capillary: 220 mg/dL — ABNORMAL HIGH (ref 70–99)
Glucose-Capillary: 233 mg/dL — ABNORMAL HIGH (ref 70–99)

## 2022-11-06 SURGERY — CYSTOSCOPY, FLEXIBLE, WITH STENT REPLACEMENT
Anesthesia: General | Site: Ureter | Laterality: Right

## 2022-11-06 MED ORDER — IOHEXOL 180 MG/ML  SOLN
INTRAMUSCULAR | Status: DC | PRN
  Administered 2022-11-06: 10 mL

## 2022-11-06 MED ORDER — CIPROFLOXACIN IN D5W 400 MG/200ML IV SOLN
400.0000 mg | INTRAVENOUS | Status: AC
  Administered 2022-11-06: 400 mg via INTRAVENOUS

## 2022-11-06 MED ORDER — PROPOFOL 10 MG/ML IV BOLUS
INTRAVENOUS | Status: DC | PRN
  Administered 2022-11-06: 90 mg via INTRAVENOUS

## 2022-11-06 MED ORDER — CHLORHEXIDINE GLUCONATE 0.12 % MT SOLN
OROMUCOSAL | Status: AC
  Administered 2022-11-06: 15 mL via OROMUCOSAL
  Filled 2022-11-06: qty 15

## 2022-11-06 MED ORDER — CHLORHEXIDINE GLUCONATE 0.12 % MT SOLN: 15.0000 mL | Freq: Once | OROMUCOSAL | Status: AC

## 2022-11-06 MED ORDER — ORAL CARE MOUTH RINSE: 15.0000 mL | Freq: Once | OROMUCOSAL | Status: AC

## 2022-11-06 MED ORDER — CIPROFLOXACIN IN D5W 400 MG/200ML IV SOLN
INTRAVENOUS | Status: AC
  Filled 2022-11-06: qty 200

## 2022-11-06 MED ORDER — INSULIN ASPART 100 UNIT/ML IJ SOLN: 5.0000 [IU] | Freq: Once | INTRAMUSCULAR | Status: AC

## 2022-11-06 MED ORDER — SODIUM CHLORIDE 0.9 % IV SOLN: INTRAVENOUS | Status: DC

## 2022-11-06 MED ORDER — INSULIN ASPART 100 UNIT/ML IJ SOLN
INTRAMUSCULAR | Status: AC
  Administered 2022-11-06: 5 [IU] via SUBCUTANEOUS
  Filled 2022-11-06: qty 1

## 2022-11-06 MED ORDER — FENTANYL CITRATE (PF) 100 MCG/2ML IJ SOLN
INTRAMUSCULAR | Status: AC
  Filled 2022-11-06: qty 2

## 2022-11-06 MED ORDER — SODIUM CHLORIDE 0.9 % IR SOLN
Status: DC | PRN
  Administered 2022-11-06: 3000 mL via INTRAVESICAL

## 2022-11-06 MED ORDER — FENTANYL CITRATE (PF) 100 MCG/2ML IJ SOLN: 25.0000 ug | INTRAMUSCULAR | Status: DC | PRN

## 2022-11-06 MED ORDER — LIDOCAINE HCL (CARDIAC) PF 100 MG/5ML IV SOSY
PREFILLED_SYRINGE | INTRAVENOUS | Status: DC | PRN
  Administered 2022-11-06: 50 mg via INTRAVENOUS

## 2022-11-06 MED ORDER — ONDANSETRON HCL 4 MG/2ML IJ SOLN: 4.0000 mg | Freq: Once | INTRAMUSCULAR | Status: DC | PRN

## 2022-11-06 SURGICAL SUPPLY — 26 items
BAG DRAIN SIEMENS DORNER NS (MISCELLANEOUS) ×1 IMPLANT
BAG DRN NS LF (MISCELLANEOUS) ×1
BRUSH SCRUB EZ  4% CHG (MISCELLANEOUS) ×1
BRUSH SCRUB EZ 1% IODOPHOR (MISCELLANEOUS) ×1 IMPLANT
BRUSH SCRUB EZ 4% CHG (MISCELLANEOUS) ×1 IMPLANT
CATH FOL 2WAY LX 16X30 (CATHETERS) IMPLANT
CATH URETL OPEN 5X70 (CATHETERS) IMPLANT
CATH URETL OPEN END 6X70 (CATHETERS) ×1 IMPLANT
DRAPE UTILITY 15X26 TOWEL STRL (DRAPES) ×1 IMPLANT
GAUZE 4X4 16PLY ~~LOC~~+RFID DBL (SPONGE) ×2 IMPLANT
GLOVE SURG UNDER POLY LF SZ7.5 (GLOVE) ×1 IMPLANT
GOWN STRL REUS W/ TWL LRG LVL3 (GOWN DISPOSABLE) ×1 IMPLANT
GOWN STRL REUS W/ TWL XL LVL3 (GOWN DISPOSABLE) ×1 IMPLANT
GOWN STRL REUS W/TWL LRG LVL3 (GOWN DISPOSABLE) ×1
GOWN STRL REUS W/TWL XL LVL3 (GOWN DISPOSABLE) ×1
GUIDEWIRE STR DUAL SENSOR (WIRE) ×1 IMPLANT
HOLDER FOLEY CATH W/STRAP (MISCELLANEOUS) IMPLANT
IV NS IRRIG 3000ML ARTHROMATIC (IV SOLUTION) ×1 IMPLANT
KIT TURNOVER CYSTO (KITS) ×1 IMPLANT
PACK CYSTO AR (MISCELLANEOUS) ×1 IMPLANT
SET CYSTO W/LG BORE CLAMP LF (SET/KITS/TRAYS/PACK) ×1 IMPLANT
STENT URET 6FRX24 CONTOUR (STENTS) IMPLANT
STENT URET 6FRX26 CONTOUR (STENTS) IMPLANT
STENT URO INLAY 6FRX24CM (STENTS) IMPLANT
SURGILUBE 2OZ TUBE FLIPTOP (MISCELLANEOUS) ×1 IMPLANT
WATER STERILE IRR 500ML POUR (IV SOLUTION) ×1 IMPLANT

## 2022-11-06 NOTE — Discharge Instructions (Addendum)
DISCHARGE INSTRUCTIONS FOR KIDNEY STONE/URETERAL STENT   MEDICATIONS:  1. Resume all your other meds from home.    ACTIVITY:  1. May resume regular activities in 24 hours.   SIGNS/SYMPTOMS TO CALL:  Common postoperative symptoms include urinary frequency, urgency, bladder spasm and blood in the urine  Please call us if you have a fever greater than 101.5, uncontrolled nausea/vomiting, uncontrolled pain, dizziness, unable to urinate, excessively bloody urine, chest pain, shortness of breath, leg swelling, leg pain, or any other concerns or questions.   You can reach [place] at [phone].   FOLLOW-UP:  1. You will be contacted for a follow-up appointment   AMBULATORY SURGERY  DISCHARGE INSTRUCTIONS   The drugs that you were given will stay in your system until tomorrow so for the next 24 hours you should not:  Drive an automobile Make any legal decisions Drink any alcoholic beverage   You may resume regular meals tomorrow.  Today it is better to start with liquids and gradually work up to solid foods.  You may eat anything you prefer, but it is better to start with liquids, then soup and crackers, and gradually work up to solid foods.   Please notify your doctor immediately if you have any unusual bleeding, trouble breathing, redness and pain at the surgery site, drainage, fever, or pain not relieved by medication.    Additional Instructions:  Please contact your physician with any problems or Same Day Surgery at [phone], Monday through Friday 6 am to 4 pm, or [place] at [facility] number at [phone].

## 2022-11-06 NOTE — Transfer of Care (Signed)
  Immediate Anesthesia Transfer of Care Note  Patient: Stephanie [name]  Procedure(s) Performed: CYSTOSCOPY WITH STENT EXCHANGE (Right: Ureter) CYSTOSCOPY WITH RETROGRADE PYELOGRAM (Right: Ureter)  Patient Location: PACU  Anesthesia Type:General  Level of Consciousness: drowsy  Airway & Oxygen Therapy: Patient Spontanous Breathing and Patient connected to face mask oxygen  Post-op Assessment: Report given to RN, Post -op Vital signs reviewed and stable, and Patient moving all extremities  Post vital signs: Reviewed and stable  Last Vitals:  Vitals Value Taken Time  BP 139/50 11/06/22 1033  Temp 36.5 C 11/06/22 1035  Pulse 48 11/06/22 1038  Resp 9 11/06/22 1038  SpO2 100 % 11/06/22 1038  Vitals shown include unvalidated device data.  Last Pain:  Vitals:   11/06/22 0924  TempSrc: Temporal  PainSc: 0-No pain         Complications: No notable events documented.

## 2022-11-06 NOTE — H&P (Signed)
Urology H&P   Urologic history: 1.  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  Chronic urinary retention Indwelling Foley  History of Present Illness: Stephanie [name] is a 82 y.o. female with chronic UPJ obstruction managed with an indwelling ureteral stent.  Her stent was last exchanged 02/06/2022.  Recent CT chest abdomen pelvis did show moderate to severe right hydronephrosis which had worsened from her prior CT.  She presents today for cystoscopy with stent exchange and right retrograde pyelogram.  Past Medical History:  Diagnosis Date   Anemia    Aortic atherosclerosis (HCC)    Arthritis    Asthma    Cancer ([place]) 1996   colon   Chronic kidney disease    Complication of anesthesia    nausea   Diabetes mellitus without complication ([place])    Dysrhythmia    Foley catheter in place    GERD (gastroesophageal reflux disease)    Glaucoma    Hyperlipidemia    Hyperparathyroidism due to renal insufficiency (HCC)    Hypertension    Lumbar disc disease    Pneumonia    PONV (postoperative nausea and vomiting)    Rheumatoid arthritis involving multiple sites with positive rheumatoid factor (HCC)    Small bowel obstruction (HCC)    Stage 3b chronic kidney disease (CKD) ([place])    Urinary retention    Uses walker    Vitamin B 12 deficiency    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    has, does not wear    Past Surgical History:  Procedure Laterality Date   ABDOMINAL HYSTERECTOMY     BACK SURGERY  2006   x5  "screws and rods"   BREAST BIOPSY Right 11/01/2021   rt br biopsy calcs ribbon clip/ benign 2nd site LIQ   BREAST BIOPSY Right 11/01/2021   rt br stereo calcs coil clip 1st site / benign UIQ   COLON SURGERY     COLONOSCOPY WITH PROPOFOL N/A 05/22/2017   Procedure: COLONOSCOPY WITH PROPOFOL;  Surgeon: Manya Silvas, MD;  Location: [facility] ENDOSCOPY;  Service: Endoscopy;  Laterality: N/A;   COLONOSCOPY WITH PROPOFOL N/A 06/08/2021   Procedure:  COLONOSCOPY WITH PROPOFOL;  Surgeon: Lucilla Lame, MD;  Location: [place];  Service: Endoscopy;  Laterality: N/A;  Diabetic   CYSTOSCOPY W/ URETERAL STENT PLACEMENT  07/25/2021   Procedure: CYSTOSCOPY WITH RETROGRADE PYELOGRAM/URETERAL STENT PLACEMENT;  Surgeon: Abbie Sons, MD;  Location: [facility] ORS;  Service: Urology;;   CYSTOSCOPY W/ URETERAL STENT PLACEMENT  02/06/2022   Procedure: CYSTOSCOPY WITH RETROGRADE PYELOGRAM/URETERAL STENT EXCHANGE;  Surgeon: Abbie Sons, MD;  Location: [facility] ORS;  Service: Urology;;   CYSTOSCOPY WITH STENT PLACEMENT Right 08/02/2020   Procedure: CYSTOSCOPY WITH STENT EXCHANGE;  Surgeon: Abbie Sons, MD;  Location: [facility];  Service: Urology;  Laterality: Right;   CYSTOSCOPY WITH STENT PLACEMENT Right 06/18/2020   Procedure: CYSTOSCOPY WITH STENT PLACEMENT;  Surgeon: Irine Seal, MD;  Location: [facility] OR[facility];  Service: Urology;  Laterality: Right;   EYE SURGERY Bilateral    Cataracts   FRACTURE SURGERY Right 2011   foot surgery   JOINT REPLACEMENT Bilateral    knees   KNEE ARTHROSCOPY Bilateral    LAPAROTOMY N/A 06/30/2020   Procedure: EXPLORATORY LAPAROTOMY;  Surgeon: Jules Husbands, MD;  Location: [facility] ORS;  Service: General;  Laterality: N/A;   LUMBAR LAMINECTOMY     POLYPECTOMY  06/08/2021   Procedure: POLYPECTOMY;  Surgeon: Lucilla Lame,  MD;  Location: [place];  Service: Endoscopy;;   TEMPORARY DIALYSIS CATHETER N/A 06/27/2020   Procedure: TEMPORARY DIALYSIS CATHETER;  Surgeon: Katha Cabal, MD;  Location: [place] [facility];  Service: Cardiovascular;  Laterality: N/A;    Home Medications:  Current Meds  Medication Sig   albuterol (VENTOLIN HFA) 108 (90 Base) MCG/ACT inhaler Inhale into the lungs every 6 (six) hours as needed for wheezing or shortness of breath.   amoxicillin-clavulanate (AUGMENTIN) 875-125 MG tablet Take 1 tablet by mouth 2 (two) times daily.   brimonidine (ALPHAGAN) 0.2 % ophthalmic solution  Place 1 drop into both eyes 2 (two) times daily.   diltiazem (CARDIZEM CD) 360 MG 24 hr capsule Take 1 capsule (360 mg total) by mouth in the morning and at bedtime.   fluticasone-salmeterol (ADVAIR) 100-50 MCG/ACT AEPB Inhale 1 puff into the lungs 2 (two) times daily.   glipiZIDE (GLUCOTROL) 10 MG tablet Take 5 mg by mouth 2 (two) times daily before a meal.   leflunomide (ARAVA) 10 MG tablet Take 10 mg by mouth every other day.   metoprolol tartrate (LOPRESSOR) 50 MG tablet Take 50 mg by mouth 2 (two) times daily.   omeprazole (PRILOSEC) 40 MG capsule Take 40 mg by mouth daily.   rosuvastatin (CRESTOR) 10 MG tablet Take 10 mg by mouth daily.     Allergies:  Allergies  Allergen Reactions   Infliximab Hives, Shortness Of Breath and Itching   Ibuprofen Itching   Indomethacin Other (See Comments)    headache   Methotrexate Derivatives Other (See Comments)    Headache   Moexipril Other (See Comments)    unknown   Percocet [Oxycodone-Acetaminophen] Itching   Naprosyn [Naproxen] Rash    Family History  Problem Relation Age of Onset   Breast cancer Neg Hx     Social History:  reports that she has never smoked. She has never used smokeless tobacco. She reports that she does not drink alcohol and does not use drugs.  ROS: Noncontributory except as per the HPI  Physical Exam:  Vital signs in last 24 hours: Temp:  [96.9 F (36.1 C)] 96.9 F (36.1 C) (02/06 0924) Pulse Rate:  [61] 61 (02/06 0924) Resp:  [16] 16 (02/06 0924) BP: (164)/(75) 164/75 (02/06 0924) SpO2:  [98 %] 98 % (02/06 0924) Weight:  [64 kg] 64 kg (02/06 0924) Constitutional:  Alert, No acute distress HEENT: [place] AT Cardiovascular: Regular rate and rhythm Respiratory: Normal respiratory effort, lungs clear bilaterally Psychiatric: Normal mood and affect   Laboratory Data:  No results for input(s): "WBC", "HGB", "HCT" in the last 72 hours. No results for input(s): "NA", "K", "CL", "CO2", "GLUCOSE", "BUN",  "CREATININE", "CALCIUM" in the last 72 hours. No results for input(s): "LABPT", "INR" in the last 72 hours. No results for input(s): "LABURIN" in the last 72 hours. Results for orders placed or performed in visit on 10/26/22  Microscopic Examination     Status: Abnormal   Collection Time: 10/26/22 11:24 AM   Urine  Result Value Ref Range Status   WBC, UA >30 (A) 0 - 5 /hpf Final   RBC, Urine 11-30 (A) 0 - 2 /hpf Final   Epithelial Cells (non renal) 0-10 0 - 10 /hpf Final   Mucus, UA Present (A) Not Estab. Final   Bacteria, UA Many (A) None seen/Few Final  CULTURE, URINE COMPREHENSIVE     Status: Abnormal   Collection Time: 10/26/22 11:41 AM   Specimen: Urine   UR  Result Value Ref Range Status   Urine Culture, Comprehensive Final report (A)  Final   Organism ID, Bacteria Klebsiella pneumoniae (A)  Final    Comment: Multi-Drug Resistant Organism Susceptibility profile is consistent with a probable ESBL. 50,000-100,000 colony forming units per mL    ANTIMICROBIAL SUSCEPTIBILITY Comment  Final    Comment:       ** S = Susceptible; I = Intermediate; R = Resistant **                    P = Positive; N = Negative             MICS are expressed in micrograms per mL    Antibiotic                 RSLT#1    RSLT#2    RSLT#3    RSLT#4 Amoxicillin/Clavulanic Acid    S Ampicillin                     R Cefazolin                      R Cefepime                       R Ceftriaxone                    R Cefuroxime                     R Ciprofloxacin                  S Ertapenem                      S Gentamicin                     R Imipenem                       S Levofloxacin                   S Meropenem                      S Nitrofurantoin                 R Piperacillin/Tazobactam        S Tetracycline                   R Tobramycin                     I Trimethoprim/Sulfa             R     Impression/Plan:   1.  Chronic right UPJ obstruction Presents for cystoscopy with right  retrograde pyelogram and right ureteral stent exchange.    11/06/2022, 9:51 AM  John Giovanni,  MD

## 2022-11-06 NOTE — Anesthesia Postprocedure Evaluation (Signed)
Anesthesia Post Note  Patient: Stephanie [name]  Procedure(s) Performed: CYSTOSCOPY WITH STENT EXCHANGE (Right: Ureter) CYSTOSCOPY WITH RETROGRADE PYELOGRAM (Right: Ureter)  Patient location during evaluation: PACU Anesthesia Type: General Level of consciousness: awake and alert Pain management: pain level controlled Vital Signs Assessment: post-procedure vital signs reviewed and stable Respiratory status: spontaneous breathing, nonlabored ventilation, respiratory function stable and patient connected to nasal cannula oxygen Cardiovascular status: blood pressure returned to baseline and stable Postop Assessment: no apparent nausea or vomiting Anesthetic complications: no   No notable events documented.   Last Vitals:  Vitals:   11/06/22 1050 11/06/22 1100  BP:  (!) 162/62  Pulse: (!) 53 (!) 52  Resp: 14 15  Temp: (!) 36.3 C   SpO2: 100% 100%    Last Pain:  Vitals:   11/06/22 1100  TempSrc:   PainSc: 0-No pain                 [name]

## 2022-11-06 NOTE — Anesthesia Preprocedure Evaluation (Signed)
Anesthesia Evaluation  Patient identified by MRN, date of birth, ID band Patient awake    Reviewed: Allergy & Precautions, H&P , NPO status , Patient's Chart, lab work & pertinent test results, reviewed documented beta blocker date and time   History of Anesthesia Complications (+) PONV and history of anesthetic complications  Airway Mallampati: II  TM Distance: >3 FB Neck ROM: full    Dental  (+) Teeth Intact   Pulmonary asthma , pneumonia   Pulmonary exam normal        Cardiovascular Exercise Tolerance: Good hypertension, Normal cardiovascular exam+ dysrhythmias  Rate:Normal     Neuro/Psych negative neurological ROS  negative psych ROS   GI/Hepatic Neg liver ROS,GERD  Medicated,,  Endo/Other  negative endocrine ROSdiabetes    Renal/GU Renal disease  negative genitourinary   Musculoskeletal   Abdominal   Peds  Hematology  (+) Blood dyscrasia, anemia   Anesthesia Other Findings   Reproductive/Obstetrics negative OB ROS                             Anesthesia Physical Anesthesia Plan  ASA: 3  Anesthesia Plan: General LMA   Post-op Pain Management:    Induction:   PONV Risk Score and Plan:   Airway Management Planned:   Additional Equipment:   Intra-op Plan:   Post-operative Plan:   Informed Consent: I have reviewed the patients History and Physical, chart, labs and discussed the procedure including the risks, benefits and alternatives for the proposed anesthesia with the patient or authorized representative who has indicated his/her understanding and acceptance.       Plan Discussed with: CRNA  Anesthesia Plan Comments:        Anesthesia Quick Evaluation

## 2022-11-06 NOTE — Op Note (Signed)
   Preoperative diagnosis:  Right hydronephrosis  Postoperative diagnosis:  Right hydronephrosis  Procedure: Cystoscopy with right ureteral stent exchange Right retrograde pyelogram  Surgeon: Abbie Sons, MD  Anesthesia: General  Complications: None  Intraoperative findings:  Cystoscopy-mild inflammatory changes secondary to indwelling Foley.  No solid or papillary lesions.  Stent without encrustation Right retrograde pyelogram-moderate to severe right hydronephrosis; no contrast seen in the ureter  EBL: Minimal  Specimens: None  Indication: Stephanie [name] is a 82 y.o. 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  Recent CT did show worsening right hydronephrosis.  She has been asymptomatic.  After reviewing the management options for treatment, he elected to proceed with the above surgical procedure(s). We have discussed the potential benefits and risks of the procedure, side effects of the proposed treatment, the likelihood of the patient achieving the goals of the procedure, and any potential problems that might occur during the procedure or recuperation. Informed consent has been obtained.  Description of procedure:  The patient was taken to the operating room and general anesthesia was induced.  The patient was placed in the dorsal lithotomy position, prepped and draped in the usual sterile fashion, and preoperative antibiotics were administered. A preoperative time-out was performed.   A 21 French cystoscope was lubricated and passed per urethra.  Panendoscopy was performed with findings as described above.  The indwelling stent was grasped with endoscopic forceps and brought out through the urethral meatus.  A 0.038 Sensor wire was then placed to the stent and advanced into the renal pelvis under fluoroscopic guidance without difficulty.  The ureteral stent was removed and replaced with a 6 French open ended  ureteral catheter.  The guidewire was removed and retrograde pyelogram was performed with findings as described above.  The guidewire was replaced and the ureteral catheter was removed.  A 73F/24 cm Bard Optima Inlay stent was placed under fluoroscopic guidance.  Good curls were noted in the renal pelvis and bladder under fluoroscopy.  She has chronic urinary retention managed with an indwelling Foley catheter which was replaced to gravity drainage.  After anesthetic reversal she was transported to the PACU in stable condition.    Plan: Will schedule a renal ultrasound in approximately 1 month to see if her hydronephrosis has improved   Abbie Sons, M.D.

## 2022-11-06 NOTE — Anesthesia Procedure Notes (Signed)
Procedure Name: LMA Insertion Date/Time: 11/06/2022 10:04 AM  Performed by: Lorie Apley, CRNAPre-anesthesia Checklist: Patient identified, Patient being monitored, Timeout performed, Emergency Drugs available and Suction available Patient Re-evaluated:Patient Re-evaluated prior to induction Oxygen Delivery Method: Circle system utilized Preoxygenation: Pre-oxygenation with 100% oxygen Induction Type: IV induction Ventilation: Mask ventilation without difficulty LMA: LMA inserted LMA Size: 3.0 Tube type: Oral Number of attempts: 1 Placement Confirmation: positive ETCO2 and breath sounds checked- equal and bilateral Tube secured with: Tape Dental Injury: Teeth and Oropharynx as per pre-operative assessment

## 2022-11-07 ENCOUNTER — Other Ambulatory Visit: Payer: Self-pay

## 2022-11-07 ENCOUNTER — Encounter: Payer: Self-pay | Admitting: Urology

## 2022-11-07 DIAGNOSIS — N1330 Unspecified hydronephrosis: Secondary | ICD-10-CM

## 2022-11-30 ENCOUNTER — Ambulatory Visit
Admission: RE | Admit: 2022-11-30 | Discharge: 2022-11-30 | Disposition: A | Discharge status: Home / Self Care | Payer: Medicare Other | Source: Ambulatory Visit | Attending: Urology | Admitting: Urology

## 2022-11-30 DIAGNOSIS — N1330 Unspecified hydronephrosis: Secondary | ICD-10-CM | POA: Diagnosis present

## 2022-12-04 ENCOUNTER — Emergency Department
Admission: EM | Admit: 2022-12-04 | Discharge: 2022-12-04 | Discharge status: Against Medical Advice | Payer: Medicare Other | Attending: Emergency Medicine | Admitting: Emergency Medicine

## 2022-12-04 ENCOUNTER — Encounter: Payer: Self-pay | Admitting: Emergency Medicine

## 2022-12-04 ENCOUNTER — Ambulatory Visit (INDEPENDENT_AMBULATORY_CARE_PROVIDER_SITE_OTHER): Payer: Medicare Other | Admitting: Physician Assistant

## 2022-12-04 VITALS — BP 158/78 | HR 66 | Wt 151.0 lb

## 2022-12-04 DIAGNOSIS — Z5321 Procedure and treatment not carried out due to patient leaving prior to being seen by health care provider: Secondary | ICD-10-CM | POA: Insufficient documentation

## 2022-12-04 DIAGNOSIS — R339 Retention of urine, unspecified: Secondary | ICD-10-CM | POA: Diagnosis present

## 2022-12-04 DIAGNOSIS — Z466 Encounter for fitting and adjustment of urinary device: Secondary | ICD-10-CM

## 2022-12-04 DIAGNOSIS — T839XXD Unspecified complication of genitourinary prosthetic device, implant and graft, subsequent encounter: Secondary | ICD-10-CM | POA: Diagnosis not present

## 2022-12-04 DIAGNOSIS — Z978 Presence of other specified devices: Secondary | ICD-10-CM

## 2022-12-04 LAB — URINALYSIS, ROUTINE W REFLEX MICROSCOPIC
Bilirubin Urine: NEGATIVE
Glucose, UA: NEGATIVE mg/dL
Hgb urine dipstick: NEGATIVE
Ketones, ur: NEGATIVE mg/dL
Nitrite: NEGATIVE
Protein, ur: >=300 mg/dL — AB
Specific Gravity, Urine: 1.011 (ref 1.005–1.030)
WBC, UA: >50 WBC/hpf (ref 0–5)
pH: 5 (ref 5.0–8.0)

## 2022-12-04 NOTE — Progress Notes (Signed)
Cath Change/ Replacement  Patient is present today for a catheter change due to urinary retention.  81m of water was removed from the balloon, a 16FR foley cath was removed without difficulty.  Patient was cleaned and prepped in a sterile fashion with betadine. A 16 FR Silastic foley cath was replaced into the bladder, no complications were noted. Urine return was noted 272mand urine was yellow in color. The balloon was filled with 1084mf sterile water. A leg bag was attached for drainage.  Patient tolerated well.    Performed by: SamDebroah LoopA-C   Additional notes: Patient was in the ED overnight with nondraining Foley that cleared with manual irrigation. Urinary sediment noted on exam today but she denies fevers or pain, low concern for infection at this time. I switched her to a Silastic catheter today to reduce risk for encrustation and instructed/demonstrated patient and family on how to manually irrigate the catheter at home as needed if it stops draining again. Irrigation supplies provided to patient.  We also discussed that her right hydro is improved following ureteral stent replacement last month with Dr. StoBernardo Heater Follow up: Return in about 4 weeks (around 01/01/2023) for Catheter exchange.

## 2022-12-04 NOTE — ED Triage Notes (Signed)
Pt presents ambulatory to triage via POV with complaints of urinary retention with a foley in place. Pt states she last emptied her bag around 2000 last night and the bag is empty in triage. Pts catheter was irrigated with a 60m flush and ~750 drained in the foley bag. Pt endorses immediate relief. A&Ox4 at this time. Denies CP or SOB.

## 2022-12-08 LAB — URINE CULTURE: Culture: >=100000 — AB

## 2022-12-09 NOTE — Progress Notes (Signed)
ED Antimicrobial Stewardship Positive Culture Follow Up   Stephanie [name] is an 82 y.o. female who presented to [facility] on 12/04/2022 with a chief complaint of  Chief Complaint  Patient presents with   Urinary Retention    Recent Results (from the past 720 hour(s))  Culture, Urine (Do not remove urinary catheter, catheter placed by urology or difficult to place)     Status: Abnormal   Collection Time: 12/04/22  2:35 AM   Specimen: Urine, Catheterized  Result Value Ref Range Status   Specimen Description   Final    URINE, CATHETERIZED Performed at [facility], [address], [place], [place] [address]    Special Requests   Final    NONE Performed at [facility], [address] [place], [place] [address]    Culture (A)  Final    >=100,000 COLONIES/mL KLEBSIELLA PNEUMONIAE Confirmed Extended Spectrum Beta-Lactamase Producer (ESBL).  In bloodstream infections from ESBL organisms, carbapenems are preferred over piperacillin/tazobactam. They are shown to have a lower risk of mortality.    Report Status 12/08/2022 FINAL  Final   Organism ID, Bacteria KLEBSIELLA PNEUMONIAE (A)  Final      Susceptibility   Klebsiella pneumoniae - MIC*    AMPICILLIN >=32 RESISTANT Resistant     CEFAZOLIN >=64 RESISTANT Resistant     CEFEPIME >=32 RESISTANT Resistant     CEFTRIAXONE >=64 RESISTANT Resistant     CIPROFLOXACIN <=0.25 SENSITIVE Sensitive     GENTAMICIN >=16 RESISTANT Resistant     IMIPENEM <=0.25 SENSITIVE Sensitive     NITROFURANTOIN 128 RESISTANT Resistant     TRIMETH/SULFA >=320 RESISTANT Resistant     AMPICILLIN/SULBACTAM >=32 RESISTANT Resistant     PIP/TAZO <=4 SENSITIVE Sensitive     * >=100,000 COLONIES/mL KLEBSIELLA PNEUMONIAE   ED urine cx result from 12/04/22 ESBL Kleb. pneumo. Pt has catheter exchanged that day per notes. looks like Ucx from 10/26/22 was also prob ESBL kleb pneumo. Suspect this is colonization and do not need to treat at this time?    Discussed with Zara Council PA of  Urology-will not treat at this time as suspect colonized.  Provider: Zara Council PA   Jerl Munyan A 12/09/2022, 1:25 PM Clinical Pharmacist

## 2023-01-04 ENCOUNTER — Ambulatory Visit (INDEPENDENT_AMBULATORY_CARE_PROVIDER_SITE_OTHER): Payer: Medicare Other | Admitting: Physician Assistant

## 2023-01-04 VITALS — BP 150/81 | HR 50 | Ht 66.0 in | Wt 151.0 lb

## 2023-01-04 DIAGNOSIS — R339 Retention of urine, unspecified: Secondary | ICD-10-CM | POA: Diagnosis not present

## 2023-01-04 DIAGNOSIS — Z466 Encounter for fitting and adjustment of urinary device: Secondary | ICD-10-CM

## 2023-01-04 NOTE — Progress Notes (Signed)
Cath Change/ Replacement  Patient is present today for a catheter change due to urinary retention.  52ml of water was removed from the balloon, a 16FR Silastic foley cath was removed without difficulty.  Patient was cleaned and prepped in a sterile fashion with betadine. A 16 FR Silastic foley cath was replaced into the bladder, no complications were noted. Urine return was noted 46ml and urine was yellow in color. The balloon was filled with 66ml of sterile water. A leg bag was attached for drainage.  Patient tolerated well.    Performed by: Carman Ching, PA-C   Additional notes: They have had to irrigate her Foley twice this month, performed without difficulty. No acute concerns today.  Follow up: Return in about 4 weeks (around 02/01/2023) for Catheter exchange.

## 2023-02-01 ENCOUNTER — Ambulatory Visit: Payer: Medicare Other | Admitting: Physician Assistant

## 2023-02-04 ENCOUNTER — Other Ambulatory Visit: Payer: Self-pay

## 2023-02-04 ENCOUNTER — Ambulatory Visit (INDEPENDENT_AMBULATORY_CARE_PROVIDER_SITE_OTHER): Payer: Medicare Other | Admitting: Physician Assistant

## 2023-02-04 ENCOUNTER — Encounter: Payer: Self-pay | Admitting: Physician Assistant

## 2023-02-04 ENCOUNTER — Encounter: Payer: Self-pay | Admitting: Intensive Care

## 2023-02-04 ENCOUNTER — Emergency Department
Admission: EM | Admit: 2023-02-04 | Discharge: 2023-02-04 | Discharge status: Against Medical Advice | Payer: Medicare Other | Attending: Emergency Medicine | Admitting: Emergency Medicine

## 2023-02-04 DIAGNOSIS — R531 Weakness: Secondary | ICD-10-CM | POA: Insufficient documentation

## 2023-02-04 DIAGNOSIS — Z5321 Procedure and treatment not carried out due to patient leaving prior to being seen by health care provider: Secondary | ICD-10-CM | POA: Insufficient documentation

## 2023-02-04 DIAGNOSIS — Z466 Encounter for fitting and adjustment of urinary device: Secondary | ICD-10-CM | POA: Diagnosis not present

## 2023-02-04 DIAGNOSIS — R339 Retention of urine, unspecified: Secondary | ICD-10-CM

## 2023-02-04 DIAGNOSIS — R630 Anorexia: Secondary | ICD-10-CM | POA: Insufficient documentation

## 2023-02-04 LAB — CBC
HCT: 30.4 % — ABNORMAL LOW (ref 36.0–46.0)
Hemoglobin: 9.5 g/dL — ABNORMAL LOW (ref 12.0–15.0)
MCH: 27.3 pg (ref 26.0–34.0)
MCHC: 31.3 g/dL (ref 30.0–36.0)
MCV: 87.4 fL (ref 80.0–100.0)
Platelets: 248 10*3/uL (ref 150–400)
RBC: 3.48 MIL/uL — ABNORMAL LOW (ref 3.87–5.11)
RDW: 13.8 % (ref 11.5–15.5)
WBC: 14 10*3/uL — ABNORMAL HIGH (ref 4.0–10.5)
nRBC: 0 % (ref 0.0–0.2)

## 2023-02-04 LAB — BASIC METABOLIC PANEL
Anion gap: 11 (ref 5–15)
BUN: 41 mg/dL — ABNORMAL HIGH (ref 8–23)
CO2: 21 mmol/L — ABNORMAL LOW (ref 22–32)
Calcium: 8.5 mg/dL — ABNORMAL LOW (ref 8.9–10.3)
Chloride: 100 mmol/L (ref 98–111)
Creatinine, Ser: 2.25 mg/dL — ABNORMAL HIGH (ref 0.44–1.00)
GFR, Estimated: 22 mL/min — ABNORMAL LOW (ref >60)
Glucose, Bld: 119 mg/dL — ABNORMAL HIGH (ref 70–99)
Potassium: 3.4 mmol/L — ABNORMAL LOW (ref 3.5–5.1)
Sodium: 132 mmol/L — ABNORMAL LOW (ref 135–145)

## 2023-02-04 NOTE — Progress Notes (Signed)
Cath Change/ Replacement  Patient is present today for a catheter change due to urinary retention.  9 ml of water was removed from the balloon, a 16 FR foley silastic cath was removed without difficulty.  Patient was cleaned and prepped in a sterile fashion with betadine A 16 FR silastic foley cath was replaced into the bladder, no complications were noted. Urine return was noted 10 ml and urine was pale yellow in color. The balloon was filled with 10ml of sterile water. A night bag was attached for drainage.  Patient was given proper instruction on catheter care.    Performed by: August Luz, RN   Follow up: 1 month follow up for catheter change

## 2023-02-04 NOTE — ED Triage Notes (Signed)
Arrived by EMS from home. Reports cold X2 weeks. C/o weakness and decreased appetite. Prescribed z-pac, predisone and antibiotic. Has not been taking predisone.  Bilateral leg swelling   EMS vitals: 116/74b/p 97% RA 56HR 134 CBG

## 2023-03-07 ENCOUNTER — Ambulatory Visit (INDEPENDENT_AMBULATORY_CARE_PROVIDER_SITE_OTHER): Payer: Medicare Other | Admitting: Physician Assistant

## 2023-03-07 VITALS — BP 117/77 | HR 72

## 2023-03-07 DIAGNOSIS — R339 Retention of urine, unspecified: Secondary | ICD-10-CM | POA: Diagnosis not present

## 2023-03-07 DIAGNOSIS — Z466 Encounter for fitting and adjustment of urinary device: Secondary | ICD-10-CM | POA: Diagnosis not present

## 2023-03-07 NOTE — Progress Notes (Signed)
Cath Change/ Replacement  Patient is present today for a catheter change due to urinary retention.  8ml of water was removed from the balloon, a 16FR Silastic foley cath was removed without difficulty.  Patient was cleaned and prepped in a sterile fashion with betadine. A 16 FR Silastic foley cath was replaced into the bladder, no complications were noted. Urine return was noted 3ml and urine was yellow in color. The balloon was filled with 10ml of sterile water. A leg bag was attached for drainage.  Patient tolerated well.    Performed by: Carman Ching, PA-C   Follow up: Return in about 4 weeks (around 04/04/2023) for Catheter exchange.

## 2023-03-13 ENCOUNTER — Other Ambulatory Visit: Payer: Self-pay | Admitting: Internal Medicine

## 2023-03-13 DIAGNOSIS — Z1231 Encounter for screening mammogram for malignant neoplasm of breast: Secondary | ICD-10-CM

## 2023-03-27 ENCOUNTER — Telehealth: Payer: Self-pay | Admitting: *Deleted

## 2023-03-27 ENCOUNTER — Inpatient Hospital Stay: Payer: Medicare Other | Attending: Internal Medicine | Admitting: Internal Medicine

## 2023-03-27 ENCOUNTER — Encounter: Payer: Self-pay | Admitting: Internal Medicine

## 2023-03-27 ENCOUNTER — Inpatient Hospital Stay: Payer: Medicare Other

## 2023-03-27 VITALS — BP 107/77 | HR 61 | Temp 98.0°F | Resp 18 | Ht 66.0 in | Wt 151.7 lb

## 2023-03-27 DIAGNOSIS — D649 Anemia, unspecified: Secondary | ICD-10-CM

## 2023-03-27 DIAGNOSIS — Z885 Allergy status to narcotic agent status: Secondary | ICD-10-CM | POA: Diagnosis not present

## 2023-03-27 DIAGNOSIS — R5383 Other fatigue: Secondary | ICD-10-CM | POA: Insufficient documentation

## 2023-03-27 DIAGNOSIS — N184 Chronic kidney disease, stage 4 (severe): Secondary | ICD-10-CM | POA: Insufficient documentation

## 2023-03-27 DIAGNOSIS — Z79899 Other long term (current) drug therapy: Secondary | ICD-10-CM | POA: Insufficient documentation

## 2023-03-27 DIAGNOSIS — Z90710 Acquired absence of both cervix and uterus: Secondary | ICD-10-CM | POA: Diagnosis not present

## 2023-03-27 DIAGNOSIS — Z886 Allergy status to analgesic agent status: Secondary | ICD-10-CM | POA: Insufficient documentation

## 2023-03-27 DIAGNOSIS — E1122 Type 2 diabetes mellitus with diabetic chronic kidney disease: Secondary | ICD-10-CM | POA: Diagnosis not present

## 2023-03-27 LAB — COMPREHENSIVE METABOLIC PANEL
ALT: 14 U/L (ref 0–44)
AST: 19 U/L (ref 15–41)
Albumin: 3.7 g/dL (ref 3.5–5.0)
Alkaline Phosphatase: 118 U/L (ref 38–126)
Anion gap: 8 (ref 5–15)
BUN: 46 mg/dL — ABNORMAL HIGH (ref 8–23)
CO2: 23 mmol/L (ref 22–32)
Calcium: 9.1 mg/dL (ref 8.9–10.3)
Chloride: 104 mmol/L (ref 98–111)
Creatinine, Ser: 2.19 mg/dL — ABNORMAL HIGH (ref 0.44–1.00)
GFR, Estimated: 22 mL/min — ABNORMAL LOW (ref >60)
Glucose, Bld: 146 mg/dL — ABNORMAL HIGH (ref 70–99)
Potassium: 4.7 mmol/L (ref 3.5–5.1)
Sodium: 135 mmol/L (ref 135–145)
Total Bilirubin: 0.1 mg/dL — ABNORMAL LOW (ref 0.3–1.2)
Total Protein: 7 g/dL (ref 6.5–8.1)

## 2023-03-27 LAB — CBC WITH DIFFERENTIAL/PLATELET
Abs Immature Granulocytes: 0.1 10*3/uL — ABNORMAL HIGH (ref 0.00–0.07)
Basophils Absolute: 0.1 10*3/uL (ref 0.0–0.1)
Basophils Relative: 1 %
Eosinophils Absolute: 0.2 10*3/uL (ref 0.0–0.5)
Eosinophils Relative: 2 %
HCT: 31.5 % — ABNORMAL LOW (ref 36.0–46.0)
Hemoglobin: 9.8 g/dL — ABNORMAL LOW (ref 12.0–15.0)
Immature Granulocytes: 1 %
Lymphocytes Relative: 17 %
Lymphs Abs: 1.6 10*3/uL (ref 0.7–4.0)
MCH: 28.3 pg (ref 26.0–34.0)
MCHC: 31.1 g/dL (ref 30.0–36.0)
MCV: 91 fL (ref 80.0–100.0)
Monocytes Absolute: 0.8 10*3/uL (ref 0.1–1.0)
Monocytes Relative: 8 %
Neutro Abs: 6.7 10*3/uL (ref 1.7–7.7)
Neutrophils Relative %: 71 %
Platelets: 262 10*3/uL (ref 150–400)
RBC: 3.46 MIL/uL — ABNORMAL LOW (ref 3.87–5.11)
RDW: 13.6 % (ref 11.5–15.5)
WBC: 9.4 10*3/uL (ref 4.0–10.5)
nRBC: 0 % (ref 0.0–0.2)

## 2023-03-27 LAB — FERRITIN: Ferritin: 128 ng/mL (ref 11–307)

## 2023-03-27 LAB — FOLATE: Folate: 18.7 ng/mL (ref >5.9)

## 2023-03-27 LAB — LACTATE DEHYDROGENASE: LDH: 160 U/L (ref 98–192)

## 2023-03-27 LAB — RETICULOCYTES
Immature Retic Fract: 8.8 % (ref 2.3–15.9)
RBC.: 3.43 MIL/uL — ABNORMAL LOW (ref 3.87–5.11)
Retic Count, Absolute: 54.5 10*3/uL (ref 19.0–186.0)
Retic Ct Pct: 1.6 % (ref 0.4–3.1)

## 2023-03-27 LAB — VITAMIN B12: Vitamin B-12: 867 pg/mL (ref 180–914)

## 2023-03-27 LAB — IRON AND TIBC
Iron: 57 ug/dL (ref 28–170)
Saturation Ratios: 18 % (ref 10.4–31.8)
TIBC: 315 ug/dL (ref 250–450)
UIBC: 258 ug/dL

## 2023-03-27 NOTE — Patient Instructions (Signed)
#  Recommend gentle iron [iron biglycinate; 28 mg ] 1 pill a day- if no issues could take 2 pills a day.  This pill is unlikely to cause stomach upset or cause constipation.

## 2023-03-27 NOTE — Progress Notes (Signed)
[facility] CONSULT NOTE  Patient Care Team: Danella Penton, MD as PCP - General (Internal Medicine) Katrinka Blazing Jethro Poling, NP (Inactive) as Nurse Practitioner (Hospice and Palliative Medicine)  CHIEF COMPLAINTS/PURPOSE OF CONSULTATION: ANEMIA   HEMATOLOGY HISTORY  # ANEMIA[Hb; MCV-platelets- WBC; Iron sat; ferritin;  GFR- CT/US; EGD/colonoscopy-[in [place]]- 2022 sep [Dr.Wohl]  # CKD- [Dr.]  HISTORY OF PRESENTING ILLNESS: Patient ambulating-with assistance. Accompanied by husband  Stephanie [name] 82 y.o.  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 Stoioff]was been referred to [place] for further evaluation of anemia.  Patient complains of ongoing fatigue.   Blood in stools: none  Blood in urine: none Difficulty swallowing: none  Change of bowel movement/constipation: none Prior blood transfusion:  in  hospital remote.  Prior history of blood loss:  Liver disease:  none Alcohol:   none   Vaginal bleeding:  none Prior evaluation with hematology: none Prior bone marrow biopsy:  none Oral iron: oon PO iron- no Constipation/dyspepsia Prior IV iron infusions: none      Review of Systems  Constitutional:  Positive for malaise/fatigue. Negative for chills, diaphoresis, fever and weight loss.  HENT:  Negative for nosebleeds and sore throat.   Eyes:  Negative for double vision.  Respiratory:  Negative for cough, hemoptysis, sputum production, shortness of breath and wheezing.   Cardiovascular:  Negative for chest pain, palpitations, orthopnea and leg swelling.  Gastrointestinal:  Negative for abdominal pain, blood in stool, constipation, diarrhea, heartburn, melena, nausea and vomiting.  Genitourinary:  Negative for dysuria, frequency and urgency.  Musculoskeletal:  Negative for back pain and joint pain.  Skin: Negative.  Negative for itching and rash.  Neurological:  Negative for dizziness,  tingling, focal weakness, weakness and headaches.  Endo/Heme/Allergies:  Does not bruise/bleed easily.  Psychiatric/Behavioral:  Negative for depression. The patient is not nervous/anxious and does not have insomnia.      MEDICAL HISTORY:  Past Medical History:  Diagnosis Date   Anemia    Aortic atherosclerosis (HCC)    Arthritis    Asthma    Cancer (HCC) 1996   colon   Chronic kidney disease    Complication of anesthesia    nausea   Diabetes mellitus without complication (HCC)    Dysrhythmia    Foley catheter in place    GERD (gastroesophageal reflux disease)    Glaucoma    Hyperlipidemia    Hyperparathyroidism due to renal insufficiency (HCC)    Hypertension    Lumbar disc disease    Pneumonia    PONV (postoperative nausea and vomiting)    Rheumatoid arthritis involving multiple sites with positive rheumatoid factor (HCC)    Small bowel obstruction (HCC)    Stage 3b chronic kidney disease (CKD) (HCC)    Urinary retention    Uses walker    Vitamin B 12 deficiency    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    has, does not wear    SURGICAL HISTORY: Past Surgical History:  Procedure Laterality Date   ABDOMINAL HYSTERECTOMY     BACK SURGERY  2006   x5  "screws and rods"   BREAST BIOPSY Right 11/01/2021   rt br biopsy calcs ribbon clip/ benign 2nd site LIQ   BREAST BIOPSY Right 11/01/2021   rt br stereo calcs coil clip 1st site / benign UIQ   COLON SURGERY     COLONOSCOPY WITH PROPOFOL N/A 05/22/2017   Procedure: COLONOSCOPY WITH  PROPOFOL;  Surgeon: Scot Jun, MD;  Location: [facility] ENDOSCOPY;  Service: Endoscopy;  Laterality: N/A;   COLONOSCOPY WITH PROPOFOL N/A 06/08/2021   Procedure: COLONOSCOPY WITH PROPOFOL;  Surgeon: Midge Minium, MD;  Location: [facility];  Service: Endoscopy;  Laterality: N/A;  Diabetic   CYSTOSCOPY W/ RETROGRADES Right 11/06/2022   Procedure: CYSTOSCOPY WITH RETROGRADE PYELOGRAM;  Surgeon: Riki Altes, MD;  Location: [facility] ORS;   Service: Urology;  Laterality: Right;   CYSTOSCOPY W/ URETERAL STENT PLACEMENT  07/25/2021   Procedure: CYSTOSCOPY WITH RETROGRADE PYELOGRAM/URETERAL STENT PLACEMENT;  Surgeon: Riki Altes, MD;  Location: [facility] ORS;  Service: Urology;;   CYSTOSCOPY W/ URETERAL STENT PLACEMENT  02/06/2022   Procedure: CYSTOSCOPY WITH RETROGRADE PYELOGRAM/URETERAL STENT EXCHANGE;  Surgeon: Riki Altes, MD;  Location: [facility] ORS;  Service: Urology;;   CYSTOSCOPY W/ URETERAL STENT PLACEMENT Right 11/06/2022   Procedure: CYSTOSCOPY WITH STENT EXCHANGE;  Surgeon: Riki Altes, MD;  Location: [facility] ORS;  Service: Urology;  Laterality: Right;   CYSTOSCOPY WITH STENT PLACEMENT Right 08/02/2020   Procedure: CYSTOSCOPY WITH STENT EXCHANGE;  Surgeon: Riki Altes, MD;  Location: [facility] ORS;  Service: Urology;  Laterality: Right;   CYSTOSCOPY WITH STENT PLACEMENT Right 06/18/2020   Procedure: CYSTOSCOPY WITH STENT PLACEMENT;  Surgeon: Bjorn Pippin, MD;  Location: [facility] ORS;  Service: Urology;  Laterality: Right;   EYE SURGERY Bilateral    Cataracts   FRACTURE SURGERY Right 2011   foot surgery   JOINT REPLACEMENT Bilateral    knees   KNEE ARTHROSCOPY Bilateral    LAPAROTOMY N/A 06/30/2020   Procedure: EXPLORATORY LAPAROTOMY;  Surgeon: Leafy Ro, MD;  Location: [facility] ORS;  Service: General;  Laterality: N/A;   LUMBAR LAMINECTOMY     POLYPECTOMY  06/08/2021   Procedure: POLYPECTOMY;  Surgeon: Midge Minium, MD;  Location: [facility];  Service: Endoscopy;;   TEMPORARY DIALYSIS CATHETER N/A 06/27/2020   Procedure: TEMPORARY DIALYSIS CATHETER;  Surgeon: Renford Dills, MD;  Location: [facility] INVASIVE CV LAB;  Service: Cardiovascular;  Laterality: N/A;    SOCIAL HISTORY: Social History   Socioeconomic History   Marital status: Married    Spouse name: [name]   Number of children: Not on file   Years of education: Not on file   Highest education level: Not on file  Occupational History   Not on  file  Tobacco Use   Smoking status: Never   Smokeless tobacco: Never  Vaping Use   Vaping Use: Never used  Substance and Sexual Activity   Alcohol use: No   Drug use: No   Sexual activity: Yes  Other Topics Concern   Not on file  Social History Narrative   Not on file   Social Determinants of Health   Financial Resource Strain: Not on file  Food Insecurity: No Food Insecurity (03/27/2023)   Hunger Vital Sign    Worried About Running Out of Food in the Last Year: Never true    Ran Out of Food in the Last Year: Never true  Transportation Needs: No Transportation Needs (03/27/2023)   PRAPARE - Administrator, Civil Service (Medical): No    Lack of Transportation (Non-Medical): No  Physical Activity: Not on file  Stress: Not on file  Social Connections: Not on file  Intimate Partner Violence: Not At Risk (03/27/2023)   Humiliation, Afraid, Rape, and Kick questionnaire    Fear of Current or Ex-Partner: No    Emotionally Abused: No  Physically Abused: No    Sexually Abused: No    FAMILY HISTORY: Family History  Problem Relation Age of Onset   Breast cancer Neg Hx     ALLERGIES:  is allergic to infliximab, ibuprofen, indomethacin, methotrexate derivatives, moexipril, percocet [oxycodone-acetaminophen], and naprosyn [naproxen].  MEDICATIONS:  Current Outpatient Medications  Medication Sig Dispense Refill   acetaminophen (TYLENOL) 325 MG tablet Take 2 tablets (650 mg total) by mouth every 6 (six) hours as needed for moderate pain, fever or headache.     albuterol (VENTOLIN HFA) 108 (90 Base) MCG/ACT inhaler Inhale into the lungs every 6 (six) hours as needed for wheezing or shortness of breath.     ascorbic acid (VITAMIN C) 500 MG tablet Take 1 tablet (500 mg total) by mouth daily.     brimonidine (ALPHAGAN) 0.2 % ophthalmic solution Place 1 drop into both eyes 2 (two) times daily.     CONTOUR TEST test strip 1 each by Other route daily.   5   Cranberry 400 MG  CAPS Take 400 mg by mouth daily.      diltiazem (CARDIZEM CD) 360 MG 24 hr capsule Take 1 capsule (360 mg total) by mouth in the morning and at bedtime. 30 capsule 0   fluticasone-salmeterol (ADVAIR) 100-50 MCG/ACT AEPB Inhale 1 puff into the lungs 2 (two) times daily.     glipiZIDE (GLUCOTROL) 10 MG tablet Take 5 mg by mouth 2 (two) times daily before a meal.     Iron-Vitamin C (VITRON-C) 65-125 MG TABS Take 1 tablet by mouth daily.     leflunomide (ARAVA) 10 MG tablet Take 10 mg by mouth every other day.     Magnesium Oxide 250 MG TABS Take 250 mg by mouth 2 (two) times daily.     metFORMIN (GLUCOPHAGE-XR) 500 MG 24 hr tablet Take 500 mg by mouth every evening.     metoCLOPramide (REGLAN) 5 MG tablet Take 5 mg by mouth 4 (four) times daily.     metoprolol tartrate (LOPRESSOR) 50 MG tablet Take 50 mg by mouth 2 (two) times daily.     omeprazole (PRILOSEC) 40 MG capsule Take 40 mg by mouth daily.     Probiotic Product (PROBIOTIC-10 PO) Take 1 capsule by mouth daily.     rosuvastatin (CRESTOR) 10 MG tablet Take 10 mg by mouth daily.      torsemide (DEMADEX) 10 MG tablet Take 1 tablet by mouth daily.     triamcinolone (KENALOG) 0.025 % ointment Apply 1 application. topically 2 (two) times daily. (Patient taking differently: Apply 1 application  topically 2 (two) times daily as needed (irritation).) 30 g 0   vitamin B-12 (CYANOCOBALAMIN) 1000 MCG tablet Take 1,000 mcg by mouth daily.     No current facility-administered medications for this visit.     Marland Kitchen  PHYSICAL EXAMINATION:   Vitals:   03/27/23 1115 03/27/23 1122  BP: (!) 169/69 107/77  Pulse: 63 61  Resp: 18   Temp: 98 F (36.7 C)   SpO2: 100%    Filed Weights   03/27/23 1115  Weight: 151 lb 11.2 oz (68.8 kg)    Physical Exam Vitals and nursing note reviewed.  HENT:     Head: Normocephalic and atraumatic.     Mouth/Throat:     Pharynx: Oropharynx is clear.  Eyes:     Extraocular Movements: Extraocular movements intact.      Pupils: Pupils are equal, round, and reactive to light.  Cardiovascular:     Rate  and Rhythm: Normal rate and regular rhythm.  Pulmonary:     Comments: Decreased breath sounds bilaterally.  Abdominal:     Palpations: Abdomen is soft.  Musculoskeletal:        General: Normal range of motion.     Cervical back: Normal range of motion.  Skin:    General: Skin is warm.  Neurological:     General: No focal deficit present.     Mental Status: She is alert and oriented to person, place, and time.  Psychiatric:        Behavior: Behavior normal.        Judgment: Judgment normal.      LABORATORY DATA:  I have reviewed the data as listed Lab Results  Component Value Date   WBC 9.4 03/27/2023   HGB 9.8 (L) 03/27/2023   HCT 31.5 (L) 03/27/2023   MCV 91.0 03/27/2023   PLT 262 03/27/2023   Recent Labs    04/28/22 1634 04/29/22 0335 05/02/22 0415 02/04/23 1854 03/27/23 1222  NA 135   < > 138 132* 135  K 4.1   < > 3.6 3.4* 4.7  CL 110   < > 118* 100 104  CO2 19*   < > 17* 21* 23  GLUCOSE 157*   < > 82 119* 146*  BUN 24*   < > 17 41* 46*  CREATININE 1.48*   < > 1.26* 2.25* 2.19*  CALCIUM 8.9   < > 8.2* 8.5* 9.1  GFRNONAA 36*   < > 43* 22* 22*  PROT 6.8  --   --   --  7.0  ALBUMIN 3.0*  --   --   --  3.7  AST 23  --   --   --  19  ALT 17  --   --   --  14  ALKPHOS 153*  --   --   --  118  BILITOT 0.5  --   --   --  0.1*   < > = values in this interval not displayed.     No results found.  ASSESSMENT & PLAN:   Symptomatic anemia # Chronic anemia-normocytic JUNE 2024- Hb 9; Normal WBC/platelets- recently getting worse.  Patient is quite symptomatic from her anemia.  The etiology is likely chronic renal disease. Labs- 2024- FEB 2024- iron sat  27; no ferritin-[PCP].  #I recommend CBC CMP LDH peripheral smear; haptoglobin; erythropoietin; iron studies ferritin B12 folic acid; reticulocyte count; hepatitis panel; HIV; multiple myeloma panel.  Kappa lambda light chain ratio.     # Pending above workup I recommend vitron C twice a day.  However if constipation I would recommend gentle iron [iron biglycinate; 28 mg ] 1 pill a day- if no issues could take 2 pills a day.  This pill is unlikely to cause stomach upset or cause constipation.  # Discussed at length the pathophysiology of anemia from chronic kidney disease which includes decreased erythropoietin production; and decreased iron stores in the body.  I discussed that I would recommend using iron infusion/Venofer; along with Retacrit to maintain hemoglobin around 10.  However patient reluctant with IV iron infusions.  She wants pills which is very reasonable.  # CKD stage IV [Dr.Kolluru/ Dr.Stoioff]c- hronic UPJ obstruction managed with an indwelling ureteral stent.  Continue follow-up with nephrology/urology.  Thank you Dr.Kolluru for allowing me to participate in the care of your pleasant patient. Please do not hesitate to contact me with questions or concerns in the  interim.  # DISPOSITION: # labs today- ordered.  # follow up 2 months or MD ; labs- cbc/bmp; possible venofer or retacrit-- Dr.B    All questions were answered. The patient knows to call the clinic with any problems, questions or concerns.    Earna Coder, MD 03/27/2023 4:41 PM

## 2023-03-27 NOTE — Assessment & Plan Note (Addendum)
#   Chronic anemia-normocytic JUNE 2024- Hb 9; Normal WBC/platelets- recently getting worse.  Patient is quite symptomatic from her anemia.  The etiology is likely chronic renal disease. Labs- 2024- FEB 2024- iron sat  27; no ferritin-[PCP].  #I recommend CBC CMP LDH peripheral smear; haptoglobin; erythropoietin; iron studies ferritin B12 folic acid; reticulocyte count; hepatitis panel; HIV; multiple myeloma panel.  Kappa lambda light chain ratio.    # Pending above workup I recommend vitron C twice a day.  However if constipation I would recommend gentle iron [iron biglycinate; 28 mg ] 1 pill a day- if no issues could take 2 pills a day.  This pill is unlikely to cause stomach upset or cause constipation.  # Discussed at length the pathophysiology of anemia from chronic kidney disease which includes decreased erythropoietin production; and decreased iron stores in the body.  I discussed that I would recommend using iron infusion/Venofer; along with Retacrit to maintain hemoglobin around 10.  However patient reluctant with IV iron infusions.  She wants pills which is very reasonable.  # CKD stage IV [Dr.Kolluru/ Dr.Stoioff]c- hronic UPJ obstruction managed with an indwelling ureteral stent.  Continue follow-up with nephrology/urology.  Thank you Dr.Kolluru for allowing me to participate in the care of your pleasant patient. Please do not hesitate to contact me with questions or concerns in the interim.  # DISPOSITION: # labs today- ordered.  # follow up 2 months or MD ; labs- cbc/bmp; possible venofer or retacrit-- Dr.B

## 2023-03-27 NOTE — Progress Notes (Signed)
No concerns for the provider today. 

## 2023-03-27 NOTE — Telephone Encounter (Signed)
No Authorization required for venofer.

## 2023-03-29 LAB — HAPTOGLOBIN: Haptoglobin: 246 mg/dL (ref 42–346)

## 2023-03-29 LAB — ERYTHROPOIETIN: Erythropoietin: 6.4 m[IU]/mL (ref 2.6–18.5)

## 2023-04-01 LAB — MULTIPLE MYELOMA PANEL, SERUM
Albumin SerPl Elph-Mcnc: 3.4 g/dL (ref 2.9–4.4)
Albumin/Glob SerPl: 1.1 (ref 0.7–1.7)
Alpha 1: 0.3 g/dL (ref 0.0–0.4)
Alpha2 Glob SerPl Elph-Mcnc: 0.8 g/dL (ref 0.4–1.0)
B-Globulin SerPl Elph-Mcnc: 1.1 g/dL (ref 0.7–1.3)
Gamma Glob SerPl Elph-Mcnc: 0.9 g/dL (ref 0.4–1.8)
Globulin, Total: 3.1 g/dL (ref 2.2–3.9)
IgA: 250 mg/dL (ref 64–422)
IgG (Immunoglobin G), Serum: 932 mg/dL (ref 586–1602)
IgM (Immunoglobulin M), Srm: 216 mg/dL (ref 26–217)
Total Protein ELP: 6.5 g/dL (ref 6.0–8.5)

## 2023-04-03 ENCOUNTER — Ambulatory Visit
Admission: RE | Admit: 2023-04-03 | Discharge: 2023-04-03 | Disposition: A | Discharge status: Home / Self Care | Payer: Medicare Other | Source: Ambulatory Visit | Attending: Internal Medicine | Admitting: Internal Medicine

## 2023-04-03 DIAGNOSIS — Z1231 Encounter for screening mammogram for malignant neoplasm of breast: Secondary | ICD-10-CM | POA: Diagnosis present

## 2023-04-08 ENCOUNTER — Encounter: Payer: Self-pay | Admitting: Physician Assistant

## 2023-04-08 ENCOUNTER — Ambulatory Visit (INDEPENDENT_AMBULATORY_CARE_PROVIDER_SITE_OTHER): Payer: Medicare Other | Admitting: Physician Assistant

## 2023-04-08 DIAGNOSIS — R339 Retention of urine, unspecified: Secondary | ICD-10-CM | POA: Diagnosis not present

## 2023-04-08 DIAGNOSIS — Z466 Encounter for fitting and adjustment of urinary device: Secondary | ICD-10-CM | POA: Diagnosis not present

## 2023-04-08 NOTE — Progress Notes (Signed)
Cath Change/ Replacement  Patient is present today for a catheter change due to urinary retention.  8ml of water was removed from the balloon, a 16FR Silastic foley cath was removed without difficulty.  Patient was cleaned and prepped in a sterile fashion with betadine. A 16 FR Silastic foley cath was replaced into the bladder, no complications were noted. Urine return was not noted but Foley was palpated within the urethra; patient was counseled to RTC if no drainage within 2 hours. The balloon was filled with 10ml of sterile water. A leg bag was attached for drainage.  Patient tolerated well.    Performed by: Carman Ching, PA-C   Follow up: Return in about 4 weeks (around 05/06/2023) for Catheter exchange.

## 2023-05-13 ENCOUNTER — Encounter: Payer: Self-pay | Admitting: Physician Assistant

## 2023-05-13 ENCOUNTER — Ambulatory Visit: Payer: Medicare Other | Admitting: Physician Assistant

## 2023-05-13 VITALS — BP 82/60 | HR 62 | Wt 158.1 lb

## 2023-05-13 DIAGNOSIS — R339 Retention of urine, unspecified: Secondary | ICD-10-CM | POA: Diagnosis not present

## 2023-05-13 DIAGNOSIS — Z466 Encounter for fitting and adjustment of urinary device: Secondary | ICD-10-CM

## 2023-05-13 NOTE — Progress Notes (Signed)
Cath Change/ Replacement  Patient is present today for a catheter change due to urinary retention.  8ml of water was removed from the balloon, a 16FR Silastic foley cath was removed without difficulty.  Patient was cleaned and prepped in a sterile fashion with betadine. A 16 FR Silastic foley cath was replaced into the bladder, no complications were noted. Urine return was noted 10ml and urine was yellow in color. The balloon was filled with 10ml of sterile water. A leg bag was attached for drainage.  Patient tolerated well.    Performed by: Carman Ching, PA-C   Follow up: Return in about 4 weeks (around 06/10/2023) for Catheter exchange.

## 2023-05-28 ENCOUNTER — Inpatient Hospital Stay (HOSPITAL_BASED_OUTPATIENT_CLINIC_OR_DEPARTMENT_OTHER): Payer: Medicare Other | Admitting: Nurse Practitioner

## 2023-05-28 ENCOUNTER — Inpatient Hospital Stay: Payer: Medicare Other

## 2023-05-28 ENCOUNTER — Inpatient Hospital Stay: Payer: Medicare Other | Attending: Internal Medicine

## 2023-05-28 ENCOUNTER — Encounter: Payer: Self-pay | Admitting: Nurse Practitioner

## 2023-05-28 ENCOUNTER — Encounter: Payer: Self-pay | Admitting: Internal Medicine

## 2023-05-28 VITALS — BP 121/79 | HR 67 | Temp 97.3°F | Wt 162.5 lb

## 2023-05-28 DIAGNOSIS — D649 Anemia, unspecified: Secondary | ICD-10-CM

## 2023-05-28 DIAGNOSIS — D631 Anemia in chronic kidney disease: Secondary | ICD-10-CM

## 2023-05-28 DIAGNOSIS — Z886 Allergy status to analgesic agent status: Secondary | ICD-10-CM | POA: Insufficient documentation

## 2023-05-28 DIAGNOSIS — Z79899 Other long term (current) drug therapy: Secondary | ICD-10-CM | POA: Diagnosis not present

## 2023-05-28 DIAGNOSIS — R531 Weakness: Secondary | ICD-10-CM | POA: Diagnosis not present

## 2023-05-28 DIAGNOSIS — E1122 Type 2 diabetes mellitus with diabetic chronic kidney disease: Secondary | ICD-10-CM | POA: Diagnosis not present

## 2023-05-28 DIAGNOSIS — N184 Chronic kidney disease, stage 4 (severe): Secondary | ICD-10-CM | POA: Insufficient documentation

## 2023-05-28 DIAGNOSIS — I129 Hypertensive chronic kidney disease with stage 1 through stage 4 chronic kidney disease, or unspecified chronic kidney disease: Secondary | ICD-10-CM | POA: Diagnosis not present

## 2023-05-28 DIAGNOSIS — R5383 Other fatigue: Secondary | ICD-10-CM | POA: Diagnosis not present

## 2023-05-28 DIAGNOSIS — Z90710 Acquired absence of both cervix and uterus: Secondary | ICD-10-CM | POA: Insufficient documentation

## 2023-05-28 DIAGNOSIS — Z885 Allergy status to narcotic agent status: Secondary | ICD-10-CM | POA: Insufficient documentation

## 2023-05-28 DIAGNOSIS — E611 Iron deficiency: Secondary | ICD-10-CM | POA: Insufficient documentation

## 2023-05-28 LAB — CBC WITH DIFFERENTIAL (CANCER CENTER ONLY)
Abs Immature Granulocytes: 0.03 10*3/uL (ref 0.00–0.07)
Basophils Absolute: 0.1 10*3/uL (ref 0.0–0.1)
Basophils Relative: 1 %
Eosinophils Absolute: 0.4 10*3/uL (ref 0.0–0.5)
Eosinophils Relative: 4 %
HCT: 29.1 % — ABNORMAL LOW (ref 36.0–46.0)
Hemoglobin: 8.8 g/dL — ABNORMAL LOW (ref 12.0–15.0)
Immature Granulocytes: 0 %
Lymphocytes Relative: 13 %
Lymphs Abs: 1.2 10*3/uL (ref 0.7–4.0)
MCH: 28 pg (ref 26.0–34.0)
MCHC: 30.2 g/dL (ref 30.0–36.0)
MCV: 92.7 fL (ref 80.0–100.0)
Monocytes Absolute: 0.9 10*3/uL (ref 0.1–1.0)
Monocytes Relative: 9 %
Neutro Abs: 7.2 10*3/uL (ref 1.7–7.7)
Neutrophils Relative %: 73 %
Platelet Count: 283 10*3/uL (ref 150–400)
RBC: 3.14 MIL/uL — ABNORMAL LOW (ref 3.87–5.11)
RDW: 13.4 % (ref 11.5–15.5)
WBC Count: 9.8 10*3/uL (ref 4.0–10.5)
nRBC: 0 % (ref 0.0–0.2)

## 2023-05-28 LAB — BASIC METABOLIC PANEL - CANCER CENTER ONLY
Anion gap: 7 (ref 5–15)
BUN: 45 mg/dL — ABNORMAL HIGH (ref 8–23)
CO2: 20 mmol/L — ABNORMAL LOW (ref 22–32)
Calcium: 8.8 mg/dL — ABNORMAL LOW (ref 8.9–10.3)
Chloride: 106 mmol/L (ref 98–111)
Creatinine: 2.43 mg/dL — ABNORMAL HIGH (ref 0.44–1.00)
GFR, Estimated: 20 mL/min — ABNORMAL LOW (ref >60)
Glucose, Bld: 303 mg/dL — ABNORMAL HIGH (ref 70–99)
Potassium: 4.9 mmol/L (ref 3.5–5.1)
Sodium: 133 mmol/L — ABNORMAL LOW (ref 135–145)

## 2023-05-28 MED ORDER — EPOETIN ALFA-EPBX 20000 UNIT/ML IJ SOLN
20000.0000 [IU] | Freq: Once | INTRAMUSCULAR | Status: AC
  Administered 2023-05-28: 20000 [IU] via SUBCUTANEOUS
  Filled 2023-05-28: qty 1

## 2023-05-28 NOTE — Progress Notes (Signed)
[facility] CONSULT NOTE  Patient Care Team: Danella Penton, MD as PCP - General (Internal Medicine) Katrinka Blazing Jethro Poling, NP (Inactive) as Nurse Practitioner (Hospice and Palliative Medicine)  CHIEF COMPLAINTS/PURPOSE OF CONSULTATION: ANEMIA  HEMATOLOGY HISTORY  # ANEMIA- [Hb; MCV-platelets- WBC; Iron sat; ferritin;  GFR- CT/US; EGD/colonoscopy-[in [place]]- 2022 sep [Dr.Wohl]  # CKD, stage IV- [Dr. 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   HISTORY OF PRESENTING ILLNESS: Patient ambulating with wheelchair. Accompanied by husband  Stephanie [name] 82 y.o.  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 Stoioff] diagnosed with anemia of chronic kidney disease and decreased iron stores, currently on oral iron, who returns to clinic for follow up. She is tolerating oral iron well without GI upset or constipation. She feels at baseline. She has chronic weakness which is unchanged. Denies any neurologic complaints. Denies recent fevers or illnesses. Denies any easy bleeding or bruising. No melena or hematochezia. No pica or restless leg. Reports good appetite and denies weight loss. Denies chest pain. Denies any nausea, vomiting, constipation, or diarrhea. Denies urinary complaints. Patient offers no further specific complaints today.   Review of Systems  Constitutional:  Positive for malaise/fatigue. Negative for chills, diaphoresis, fever and weight loss.  HENT:  Negative for nosebleeds.   Respiratory:  Negative for cough, hemoptysis, sputum production, shortness of breath and wheezing.   Cardiovascular:  Negative for chest pain, palpitations, orthopnea and leg swelling.  Gastrointestinal:  Negative for abdominal pain, blood in stool, constipation, diarrhea, heartburn, melena, nausea and vomiting.  Genitourinary:  Negative for dysuria, frequency, hematuria and  urgency.       Denies vaginal bleeding  Musculoskeletal:  Negative for back pain and joint pain.  Skin: Negative.  Negative for itching and rash.  Neurological:  Negative for dizziness, tingling, weakness and headaches.  Endo/Heme/Allergies:  Does not bruise/bleed easily.  Psychiatric/Behavioral:  Negative for depression. The patient is not nervous/anxious.     MEDICAL HISTORY:  Past Medical History:  Diagnosis Date   Anemia    Aortic atherosclerosis (HCC)    Arthritis    Asthma    Cancer (HCC) 1996   colon   Chronic kidney disease    Complication of anesthesia    nausea   Diabetes mellitus without complication (HCC)    Dysrhythmia    Foley catheter in place    GERD (gastroesophageal reflux disease)    Glaucoma    Hyperlipidemia    Hyperparathyroidism due to renal insufficiency (HCC)    Hypertension    Lumbar disc disease    Pneumonia    PONV (postoperative nausea and vomiting)    Rheumatoid arthritis involving multiple sites with positive rheumatoid factor (HCC)    Small bowel obstruction (HCC)    Stage 3b chronic kidney disease (CKD) (HCC)    Urinary retention    Uses walker    Vitamin B 12 deficiency    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    has, does not wear    SURGICAL HISTORY: Past Surgical History:  Procedure Laterality Date   ABDOMINAL HYSTERECTOMY     BACK SURGERY  2006   x5  "screws and rods"   BREAST BIOPSY Right 11/01/2021   rt br biopsy calcs ribbon clip/ benign 2nd site LIQ   BREAST BIOPSY Right 11/01/2021   rt br stereo calcs coil clip 1st site / benign UIQ   COLON SURGERY  COLONOSCOPY WITH PROPOFOL N/A 05/22/2017   Procedure: COLONOSCOPY WITH PROPOFOL;  Surgeon: Scot Jun, MD;  Location: [facility] ENDOSCOPY;  Service: Endoscopy;  Laterality: N/A;   COLONOSCOPY WITH PROPOFOL N/A 06/08/2021   Procedure: COLONOSCOPY WITH PROPOFOL;  Surgeon: Midge Minium, MD;  Location: [facility];  Service: Endoscopy;  Laterality: N/A;  Diabetic    CYSTOSCOPY W/ RETROGRADES Right 11/06/2022   Procedure: CYSTOSCOPY WITH RETROGRADE PYELOGRAM;  Surgeon: Riki Altes, MD;  Location: [facility] ORS;  Service: Urology;  Laterality: Right;   CYSTOSCOPY W/ URETERAL STENT PLACEMENT  07/25/2021   Procedure: CYSTOSCOPY WITH RETROGRADE PYELOGRAM/URETERAL STENT PLACEMENT;  Surgeon: Riki Altes, MD;  Location: [facility] ORS;  Service: Urology;;   CYSTOSCOPY W/ URETERAL STENT PLACEMENT  02/06/2022   Procedure: CYSTOSCOPY WITH RETROGRADE PYELOGRAM/URETERAL STENT EXCHANGE;  Surgeon: Riki Altes, MD;  Location: [facility] ORS;  Service: Urology;;   CYSTOSCOPY W/ URETERAL STENT PLACEMENT Right 11/06/2022   Procedure: CYSTOSCOPY WITH STENT EXCHANGE;  Surgeon: Riki Altes, MD;  Location: [facility] ORS;  Service: Urology;  Laterality: Right;   CYSTOSCOPY WITH STENT PLACEMENT Right 08/02/2020   Procedure: CYSTOSCOPY WITH STENT EXCHANGE;  Surgeon: Riki Altes, MD;  Location: [facility] ORS;  Service: Urology;  Laterality: Right;   CYSTOSCOPY WITH STENT PLACEMENT Right 06/18/2020   Procedure: CYSTOSCOPY WITH STENT PLACEMENT;  Surgeon: Bjorn Pippin, MD;  Location: [facility] ORS;  Service: Urology;  Laterality: Right;   EYE SURGERY Bilateral    Cataracts   FRACTURE SURGERY Right 2011   foot surgery   JOINT REPLACEMENT Bilateral    knees   KNEE ARTHROSCOPY Bilateral    LAPAROTOMY N/A 06/30/2020   Procedure: EXPLORATORY LAPAROTOMY;  Surgeon: Leafy Ro, MD;  Location: [facility] ORS;  Service: General;  Laterality: N/A;   LUMBAR LAMINECTOMY     POLYPECTOMY  06/08/2021   Procedure: POLYPECTOMY;  Surgeon: Midge Minium, MD;  Location: [facility];  Service: Endoscopy;;   TEMPORARY DIALYSIS CATHETER N/A 06/27/2020   Procedure: TEMPORARY DIALYSIS CATHETER;  Surgeon: Renford Dills, MD;  Location: [facility] INVASIVE CV LAB;  Service: Cardiovascular;  Laterality: N/A;    SOCIAL HISTORY: Social History   Socioeconomic History   Marital status: Married    Spouse name:  [name]   Number of children: Not on file   Years of education: Not on file   Highest education level: Not on file  Occupational History   Not on file  Tobacco Use   Smoking status: Never   Smokeless tobacco: Never  Vaping Use   Vaping status: Never Used  Substance and Sexual Activity   Alcohol use: No   Drug use: No   Sexual activity: Yes  Other Topics Concern   Not on file  Social History Narrative   Not on file   Social Determinants of Health   Financial Resource Strain: Not on file  Food Insecurity: No Food Insecurity (03/27/2023)   Hunger Vital Sign    Worried About Running Out of Food in the Last Year: Never true    Ran Out of Food in the Last Year: Never true  Transportation Needs: No Transportation Needs (03/27/2023)   PRAPARE - Administrator, Civil Service (Medical): No    Lack of Transportation (Non-Medical): No  Physical Activity: Not on file  Stress: Not on file  Social Connections: Not on file  Intimate Partner Violence: Not At Risk (03/27/2023)   Humiliation, Afraid, Rape, and Kick questionnaire    Fear of Current or  Ex-Partner: No    Emotionally Abused: No    Physically Abused: No    Sexually Abused: No    FAMILY HISTORY: Family History  Problem Relation Age of Onset   Breast cancer Neg Hx     ALLERGIES:  is allergic to infliximab, ibuprofen, indomethacin, methotrexate derivatives, moexipril, percocet [oxycodone-acetaminophen], and naprosyn [naproxen].   MEDICATIONS:  Current Outpatient Medications  Medication Sig Dispense Refill   acetaminophen (TYLENOL) 325 MG tablet Take 2 tablets (650 mg total) by mouth every 6 (six) hours as needed for moderate pain, fever or headache.     albuterol (VENTOLIN HFA) 108 (90 Base) MCG/ACT inhaler Inhale into the lungs every 6 (six) hours as needed for wheezing or shortness of breath.     ascorbic acid (VITAMIN C) 500 MG tablet Take 1 tablet (500 mg total) by mouth daily.     brimonidine (ALPHAGAN) 0.2  % ophthalmic solution Place 1 drop into both eyes 2 (two) times daily.     CONTOUR TEST test strip 1 each by Other route daily.   5   Cranberry 400 MG CAPS Take 400 mg by mouth daily.      diltiazem (CARDIZEM CD) 360 MG 24 hr capsule Take 1 capsule (360 mg total) by mouth in the morning and at bedtime. 30 capsule 0   fluticasone-salmeterol (ADVAIR) 100-50 MCG/ACT AEPB Inhale 1 puff into the lungs 2 (two) times daily.     glipiZIDE (GLUCOTROL) 10 MG tablet Take 5 mg by mouth daily.     hydrALAZINE (APRESOLINE) 25 MG tablet Take 1 tablet by mouth 2 (two) times daily.     Iron-Vitamin C (VITRON-C) 65-125 MG TABS Take 1 tablet by mouth daily.     leflunomide (ARAVA) 10 MG tablet Take 10 mg by mouth every other day.     Magnesium Oxide 250 MG TABS Take 250 mg by mouth 2 (two) times daily.     metFORMIN (GLUCOPHAGE-XR) 500 MG 24 hr tablet Take 500 mg by mouth every evening.     metoCLOPramide (REGLAN) 5 MG tablet Take 5 mg by mouth 4 (four) times daily.     metoprolol tartrate (LOPRESSOR) 50 MG tablet Take 50 mg by mouth 2 (two) times daily.     omeprazole (PRILOSEC) 40 MG capsule Take 40 mg by mouth daily.     Probiotic Product (PROBIOTIC-10 PO) Take 1 capsule by mouth daily.     rosuvastatin (CRESTOR) 10 MG tablet Take 10 mg by mouth daily.      torsemide (DEMADEX) 10 MG tablet Take 1 tablet by mouth daily.     triamcinolone (KENALOG) 0.025 % ointment Apply 1 application. topically 2 (two) times daily. (Patient taking differently: Apply 1 application  topically 2 (two) times daily as needed (irritation).) 30 g 0   vitamin B-12 (CYANOCOBALAMIN) 1000 MCG tablet Take 1,000 mcg by mouth daily.     No current facility-administered medications for this visit.     PHYSICAL EXAMINATION: Vitals:   05/28/23 1013  BP: 121/79  Pulse: 67  Temp: (!) 97.3 F (36.3 C)  SpO2: 97%   Filed Weights   05/28/23 1013  Weight: 162 lb 8 oz (73.7 kg)   Physical Exam Vitals reviewed.  Constitutional:       Appearance: She is ill-appearing.     Comments: Chronically ill appearing. Frail.   HENT:     Head: Normocephalic and atraumatic.  Cardiovascular:     Rate and Rhythm: Normal rate and regular rhythm.  Pulmonary:  Effort: No respiratory distress.     Comments: Decreased breath sounds bilaterally.  Abdominal:     General: There is no distension.  Skin:    General: Skin is warm and dry.     Coloration: Skin is not pale.  Neurological:     Mental Status: She is alert and oriented to person, place, and time.  Psychiatric:        Mood and Affect: Mood normal.        Behavior: Behavior normal.      LABORATORY DATA:  I have reviewed the data as listed Lab Results  Component Value Date   WBC 9.8 05/28/2023   HGB 8.8 (L) 05/28/2023   HCT 29.1 (L) 05/28/2023   MCV 92.7 05/28/2023   PLT 283 05/28/2023   Recent Labs    02/04/23 1854 03/27/23 1222 05/28/23 1002  NA 132* 135 133*  K 3.4* 4.7 4.9  CL 100 104 106  CO2 21* 23 20*  GLUCOSE 119* 146* 303*  BUN 41* 46* 45*  CREATININE 2.25* 2.19* 2.43*  CALCIUM 8.5* 9.1 8.8*  GFRNONAA 22* 22* 20*  PROT  --  7.0  --   ALBUMIN  --  3.7  --   AST  --  19  --   ALT  --  14  --   ALKPHOS  --  118  --   BILITOT  --  0.1*  --    Iron/TIBC/Ferritin/ %Sat    Component Value Date/Time   IRON 57 03/27/2023 1222   TIBC 315 03/27/2023 1222   FERRITIN 128 03/27/2023 1222   IRONPCTSAT 18 03/27/2023 1222   No results found.  ASSESSMENT & PLAN:   # Chronic anemia- normocytic JUNE 2024- Hb 9; Normal WBC/platelets- Worsening. Symptomatic. Workup with Dr Donneta Romberg most consistent with anemia secondary to her chronic renal disease. Today we again reviewed the pathophysiology of anemia from chronic kidney disease and impact on erythropoietin production and decreased iron stores. Also reviewed that worsening kidney disease often results in worsening anemia without intervention. She was reluctant to start IV iron infusions and is taking oral  iron biglycinate 28 mg daily, tolerating well. However, her hemoglobin has worsened, 8.8 (previously 9 baseline). Recommend starting retacrit 20,000 units with goal to maintain hemoglobin around 10. Also recommend optimizing iron stores (see below). Reviewed potential risks of retacrit including thrombosis and hypertension, though less likely with hmg < 11. Patient agrees to proceed.   # Iron Deficiency- in setting of CKD. Reluctant to proceed with IV iron. Continue oral iron biglycinate daily. Ferritin previously 128, iron sat 18%. Plan to repeat iron levels at next visit. If decreased, recommend IV iron.   # CKD stage IV [Dr.Kolluru/ Dr.Stoioff]- chronic UPJ obstruction managed with an indwelling ureteral stent.  Continue follow-up with nephrology/urology. GFR 20 today.   # Hyperglycemia- glucose 303 today. Reviewed need for carbohydrate restriction in her diety.    Thank you Dr. Wynelle Link for allowing me to participate in the care of your pleasant patient. Please do not hesitate to contact me with questions or concerns in the interim.   DISPOSITION: 1 mo- lab (cbc, bmp, ferritin, iron studies), Dr Donneta Romberg, +/- retacrit - la  No problem-specific Assessment & Plan notes found for this encounter.  All questions were answered. The patient knows to call the clinic with any problems, questions or concerns.  Alinda Dooms, NP 05/28/2023

## 2023-06-13 ENCOUNTER — Ambulatory Visit (INDEPENDENT_AMBULATORY_CARE_PROVIDER_SITE_OTHER): Payer: Medicare Other | Admitting: Physician Assistant

## 2023-06-13 VITALS — BP 126/79 | HR 69 | Ht 72.0 in | Wt 162.0 lb

## 2023-06-13 DIAGNOSIS — Z466 Encounter for fitting and adjustment of urinary device: Secondary | ICD-10-CM

## 2023-06-13 DIAGNOSIS — R339 Retention of urine, unspecified: Secondary | ICD-10-CM

## 2023-06-13 NOTE — Progress Notes (Signed)
Cath Change/ Replacement  Patient is present today for a catheter change due to urinary retention.  8ml of water was removed from the balloon, a 16FR Silastic foley cath was removed without difficulty.  Patient was cleaned and prepped in a sterile fashion with betadine A 16 FR Silastic foley cath was replaced into the bladder, no complications were noted. Urine return was noted 20ml and urine was yellow in color. The balloon was filled with 10ml of sterile water. A leg bag was attached for drainage.  Patient tolerated well.    Performed by: Carman Ching, PA-C   Follow up: Return in about 4 weeks (around 07/11/2023) for Catheter exchange.

## 2023-06-25 ENCOUNTER — Inpatient Hospital Stay: Payer: Medicare Other | Attending: Internal Medicine

## 2023-06-25 ENCOUNTER — Encounter: Payer: Self-pay | Admitting: Internal Medicine

## 2023-06-25 ENCOUNTER — Inpatient Hospital Stay: Payer: Medicare Other

## 2023-06-25 ENCOUNTER — Inpatient Hospital Stay (HOSPITAL_BASED_OUTPATIENT_CLINIC_OR_DEPARTMENT_OTHER): Payer: Medicare Other | Admitting: Internal Medicine

## 2023-06-25 VITALS — BP 144/75 | HR 61 | Temp 96.5°F | Resp 19 | Wt 156.0 lb

## 2023-06-25 DIAGNOSIS — Z886 Allergy status to analgesic agent status: Secondary | ICD-10-CM | POA: Insufficient documentation

## 2023-06-25 DIAGNOSIS — K219 Gastro-esophageal reflux disease without esophagitis: Secondary | ICD-10-CM | POA: Insufficient documentation

## 2023-06-25 DIAGNOSIS — K921 Melena: Secondary | ICD-10-CM | POA: Diagnosis not present

## 2023-06-25 DIAGNOSIS — N184 Chronic kidney disease, stage 4 (severe): Secondary | ICD-10-CM

## 2023-06-25 DIAGNOSIS — Z90710 Acquired absence of both cervix and uterus: Secondary | ICD-10-CM | POA: Insufficient documentation

## 2023-06-25 DIAGNOSIS — D631 Anemia in chronic kidney disease: Secondary | ICD-10-CM | POA: Diagnosis present

## 2023-06-25 DIAGNOSIS — Z885 Allergy status to narcotic agent status: Secondary | ICD-10-CM | POA: Diagnosis not present

## 2023-06-25 DIAGNOSIS — J45909 Unspecified asthma, uncomplicated: Secondary | ICD-10-CM | POA: Insufficient documentation

## 2023-06-25 DIAGNOSIS — E785 Hyperlipidemia, unspecified: Secondary | ICD-10-CM | POA: Insufficient documentation

## 2023-06-25 DIAGNOSIS — E1122 Type 2 diabetes mellitus with diabetic chronic kidney disease: Secondary | ICD-10-CM | POA: Diagnosis not present

## 2023-06-25 DIAGNOSIS — D649 Anemia, unspecified: Secondary | ICD-10-CM

## 2023-06-25 DIAGNOSIS — E875 Hyperkalemia: Secondary | ICD-10-CM | POA: Diagnosis not present

## 2023-06-25 DIAGNOSIS — Z79899 Other long term (current) drug therapy: Secondary | ICD-10-CM | POA: Insufficient documentation

## 2023-06-25 DIAGNOSIS — R5383 Other fatigue: Secondary | ICD-10-CM | POA: Diagnosis not present

## 2023-06-25 LAB — BASIC METABOLIC PANEL
Anion gap: 6 (ref 5–15)
BUN: 54 mg/dL — ABNORMAL HIGH (ref 8–23)
CO2: 22 mmol/L (ref 22–32)
Calcium: 9 mg/dL (ref 8.9–10.3)
Chloride: 107 mmol/L (ref 98–111)
Creatinine, Ser: 2.53 mg/dL — ABNORMAL HIGH (ref 0.44–1.00)
GFR, Estimated: 19 mL/min — ABNORMAL LOW (ref >60)
Glucose, Bld: 213 mg/dL — ABNORMAL HIGH (ref 70–99)
Potassium: 5.2 mmol/L — ABNORMAL HIGH (ref 3.5–5.1)
Sodium: 135 mmol/L (ref 135–145)

## 2023-06-25 LAB — IRON AND TIBC
Iron: 32 ug/dL (ref 28–170)
Saturation Ratios: 13 % (ref 10.4–31.8)
TIBC: 249 ug/dL — ABNORMAL LOW (ref 250–450)
UIBC: 217 ug/dL

## 2023-06-25 LAB — CBC WITH DIFFERENTIAL/PLATELET
Abs Immature Granulocytes: 0.04 10*3/uL (ref 0.00–0.07)
Basophils Absolute: 0.1 10*3/uL (ref 0.0–0.1)
Basophils Relative: 1 %
Eosinophils Absolute: 0.2 10*3/uL (ref 0.0–0.5)
Eosinophils Relative: 2 %
HCT: 31.7 % — ABNORMAL LOW (ref 36.0–46.0)
Hemoglobin: 9.7 g/dL — ABNORMAL LOW (ref 12.0–15.0)
Immature Granulocytes: 0 %
Lymphocytes Relative: 14 %
Lymphs Abs: 1.6 10*3/uL (ref 0.7–4.0)
MCH: 27.7 pg (ref 26.0–34.0)
MCHC: 30.6 g/dL (ref 30.0–36.0)
MCV: 90.6 fL (ref 80.0–100.0)
Monocytes Absolute: 1 10*3/uL (ref 0.1–1.0)
Monocytes Relative: 9 %
Neutro Abs: 8.7 10*3/uL — ABNORMAL HIGH (ref 1.7–7.7)
Neutrophils Relative %: 74 %
Platelets: 261 10*3/uL (ref 150–400)
RBC: 3.5 MIL/uL — ABNORMAL LOW (ref 3.87–5.11)
RDW: 14.2 % (ref 11.5–15.5)
WBC: 11.6 10*3/uL — ABNORMAL HIGH (ref 4.0–10.5)
nRBC: 0 % (ref 0.0–0.2)

## 2023-06-25 LAB — FERRITIN: Ferritin: 164 ng/mL (ref 11–307)

## 2023-06-25 MED ORDER — EPOETIN ALFA-EPBX 20000 UNIT/ML IJ SOLN
20000.0000 [IU] | Freq: Once | INTRAMUSCULAR | Status: AC
  Administered 2023-06-25: 20000 [IU] via SUBCUTANEOUS
  Filled 2023-06-25: qty 1

## 2023-06-25 NOTE — Assessment & Plan Note (Addendum)
#   Chronic anemia-normocytic JUNE 2024- Hb 9; Normal WBC/platelets- recently getting worse.  Patient is quite symptomatic from her anemia.  The etiology is likely chronic renal disease. Labs- 2024- FEB 2024- iron sat  27; no ferritin-[PCP].  haptoglobin; erythropoietin; B12 folic acid; reticulocyte count; hepatitis panel; HIV; multiple myeloma panel.  Kappa lambda light chain ratio- NEGATIVE.  #Hb today 9.7- continue vitron C twice a day [black stool]. And continue retacrit- q monthly.  # CKD stage IV [Dr.Kolluru/ Dr.Stoioff]c- hronic UPJ obstruction managed with an indwelling ureteral stent.  Continue follow-up with nephrology/urology.Mild hyperkalemia- 5.2- awaiting re-evaluation with Dr.Kolluru on 9/25.   # DISPOSITION: # Retacrit today-  # 4 weeks- lab- H&H; possible retacrit # follow up 8 weeks- APP- labs- cbc/bmp; possible venofer or retacrit # 10 weeks- lab- H&H; possible retacrit # follow up 12 weeks- MD ; labs- cbc/bmp; iron studies; ferritin; possible venofer or retacrit-- Dr.B

## 2023-06-25 NOTE — Progress Notes (Signed)
[facility] CONSULT NOTE  Patient Care Team: Danella Penton, MD as PCP - General (Internal Medicine) Katrinka Blazing Jethro Poling, NP (Inactive) as Nurse Practitioner (Hospice and Palliative Medicine) Earna Coder, MD as Consulting Physician (Oncology)  CHIEF COMPLAINTS/PURPOSE OF CONSULTATION: ANEMIA   HEMATOLOGY HISTORY  # ANEMIA[Hb; MCV-platelets- WBC; Iron sat; ferritin;  GFR- CT/US; EGD/colonoscopy-[in [place]]- 2022 sep [Dr.Wohl]  # CKD- [Dr.]  HISTORY OF PRESENTING ILLNESS: Patient ambulating-with assistance. Accompanied by husband  Stephanie [name] 82 y.o.  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 Stoioff]was been referred to [place] for further evaluation of anemia.  Patient complains of ongoing fatigue.     Review of Systems  Constitutional:  Positive for malaise/fatigue. Negative for chills, diaphoresis, fever and weight loss.  HENT:  Negative for nosebleeds and sore throat.   Eyes:  Negative for double vision.  Respiratory:  Negative for cough, hemoptysis, sputum production, shortness of breath and wheezing.   Cardiovascular:  Negative for chest pain, palpitations, orthopnea and leg swelling.  Gastrointestinal:  Negative for abdominal pain, blood in stool, constipation, diarrhea, heartburn, melena, nausea and vomiting.  Genitourinary:  Negative for dysuria, frequency and urgency.  Musculoskeletal:  Negative for back pain and joint pain.  Skin: Negative.  Negative for itching and rash.  Neurological:  Negative for dizziness, tingling, focal weakness, weakness and headaches.  Endo/Heme/Allergies:  Does not bruise/bleed easily.  Psychiatric/Behavioral:  Negative for depression. The patient is not nervous/anxious and does not have insomnia.      MEDICAL HISTORY:  Past Medical History:  Diagnosis Date   Anemia    Aortic atherosclerosis (HCC)    Arthritis    Asthma     Cancer (HCC) 1996   colon   Chronic kidney disease    Complication of anesthesia    nausea   Diabetes mellitus without complication (HCC)    Dysrhythmia    Foley catheter in place    GERD (gastroesophageal reflux disease)    Glaucoma    Hyperlipidemia    Hyperparathyroidism due to renal insufficiency (HCC)    Hypertension    Lumbar disc disease    Pneumonia    PONV (postoperative nausea and vomiting)    Rheumatoid arthritis involving multiple sites with positive rheumatoid factor (HCC)    Small bowel obstruction (HCC)    Stage 3b chronic kidney disease (CKD) (HCC)    Urinary retention    Uses walker    Vitamin B 12 deficiency    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    has, does not wear    SURGICAL HISTORY: Past Surgical History:  Procedure Laterality Date   ABDOMINAL HYSTERECTOMY     BACK SURGERY  2006   x5  "screws and rods"   BREAST BIOPSY Right 11/01/2021   rt br biopsy calcs ribbon clip/ benign 2nd site LIQ   BREAST BIOPSY Right 11/01/2021   rt br stereo calcs coil clip 1st site / benign UIQ   COLON SURGERY     COLONOSCOPY WITH PROPOFOL N/A 05/22/2017   Procedure: COLONOSCOPY WITH PROPOFOL;  Surgeon: Scot Jun, MD;  Location: [facility] ENDOSCOPY;  Service: Endoscopy;  Laterality: N/A;   COLONOSCOPY WITH PROPOFOL N/A 06/08/2021   Procedure: COLONOSCOPY WITH PROPOFOL;  Surgeon: Midge Minium, MD;  Location: [facility];  Service: Endoscopy;  Laterality: N/A;  Diabetic   CYSTOSCOPY W/ RETROGRADES Right 11/06/2022   Procedure: CYSTOSCOPY WITH RETROGRADE PYELOGRAM;  Surgeon:  Stoioff, Verna Czech, MD;  Location: [facility] ORS;  Service: Urology;  Laterality: Right;   CYSTOSCOPY W/ URETERAL STENT PLACEMENT  07/25/2021   Procedure: CYSTOSCOPY WITH RETROGRADE PYELOGRAM/URETERAL STENT PLACEMENT;  Surgeon: Riki Altes, MD;  Location: [facility] ORS;  Service: Urology;;   CYSTOSCOPY W/ URETERAL STENT PLACEMENT  02/06/2022   Procedure: CYSTOSCOPY WITH RETROGRADE PYELOGRAM/URETERAL  STENT EXCHANGE;  Surgeon: Riki Altes, MD;  Location: [facility] ORS;  Service: Urology;;   CYSTOSCOPY W/ URETERAL STENT PLACEMENT Right 11/06/2022   Procedure: CYSTOSCOPY WITH STENT EXCHANGE;  Surgeon: Riki Altes, MD;  Location: [facility] ORS;  Service: Urology;  Laterality: Right;   CYSTOSCOPY WITH STENT PLACEMENT Right 08/02/2020   Procedure: CYSTOSCOPY WITH STENT EXCHANGE;  Surgeon: Riki Altes, MD;  Location: [facility] ORS;  Service: Urology;  Laterality: Right;   CYSTOSCOPY WITH STENT PLACEMENT Right 06/18/2020   Procedure: CYSTOSCOPY WITH STENT PLACEMENT;  Surgeon: Bjorn Pippin, MD;  Location: [facility] ORS;  Service: Urology;  Laterality: Right;   EYE SURGERY Bilateral    Cataracts   FRACTURE SURGERY Right 2011   foot surgery   JOINT REPLACEMENT Bilateral    knees   KNEE ARTHROSCOPY Bilateral    LAPAROTOMY N/A 06/30/2020   Procedure: EXPLORATORY LAPAROTOMY;  Surgeon: Leafy Ro, MD;  Location: [facility] ORS;  Service: General;  Laterality: N/A;   LUMBAR LAMINECTOMY     POLYPECTOMY  06/08/2021   Procedure: POLYPECTOMY;  Surgeon: Midge Minium, MD;  Location: [facility];  Service: Endoscopy;;   TEMPORARY DIALYSIS CATHETER N/A 06/27/2020   Procedure: TEMPORARY DIALYSIS CATHETER;  Surgeon: Renford Dills, MD;  Location: [facility] INVASIVE CV LAB;  Service: Cardiovascular;  Laterality: N/A;    SOCIAL HISTORY: Social History   Socioeconomic History   Marital status: Married    Spouse name: [name]   Number of children: Not on file   Years of education: Not on file   Highest education level: Not on file  Occupational History   Not on file  Tobacco Use   Smoking status: Never   Smokeless tobacco: Never  Vaping Use   Vaping status: Never Used  Substance and Sexual Activity   Alcohol use: No   Drug use: No   Sexual activity: Yes  Other Topics Concern   Not on file  Social History Narrative   Not on file   Social Determinants of Health   Financial Resource Strain: Not on  file  Food Insecurity: No Food Insecurity (03/27/2023)   Hunger Vital Sign    Worried About Running Out of Food in the Last Year: Never true    Ran Out of Food in the Last Year: Never true  Transportation Needs: No Transportation Needs (03/27/2023)   PRAPARE - Administrator, Civil Service (Medical): No    Lack of Transportation (Non-Medical): No  Physical Activity: Not on file  Stress: Not on file  Social Connections: Not on file  Intimate Partner Violence: Not At Risk (03/27/2023)   Humiliation, Afraid, Rape, and Kick questionnaire    Fear of Current or Ex-Partner: No    Emotionally Abused: No    Physically Abused: No    Sexually Abused: No    FAMILY HISTORY: Family History  Problem Relation Age of Onset   Breast cancer Neg Hx     ALLERGIES:  is allergic to infliximab, ibuprofen, indomethacin, methotrexate derivatives, moexipril, percocet [oxycodone-acetaminophen], and naprosyn [naproxen].  MEDICATIONS:  Current Outpatient Medications  Medication Sig Dispense Refill   acetaminophen (TYLENOL)  325 MG tablet Take 2 tablets (650 mg total) by mouth every 6 (six) hours as needed for moderate pain, fever or headache.     albuterol (VENTOLIN HFA) 108 (90 Base) MCG/ACT inhaler Inhale into the lungs every 6 (six) hours as needed for wheezing or shortness of breath.     ascorbic acid (VITAMIN C) 500 MG tablet Take 1 tablet (500 mg total) by mouth daily.     brimonidine (ALPHAGAN) 0.2 % ophthalmic solution Place 1 drop into both eyes 2 (two) times daily.     CONTOUR TEST test strip 1 each by Other route daily.   5   Cranberry 400 MG CAPS Take 400 mg by mouth daily.      diltiazem (CARDIZEM CD) 360 MG 24 hr capsule Take 1 capsule (360 mg total) by mouth in the morning and at bedtime. 30 capsule 0   fluticasone-salmeterol (ADVAIR) 100-50 MCG/ACT AEPB Inhale 1 puff into the lungs 2 (two) times daily.     glipiZIDE (GLUCOTROL) 10 MG tablet Take 5 mg by mouth daily.     hydrALAZINE  (APRESOLINE) 25 MG tablet Take 1 tablet by mouth 2 (two) times daily.     Iron-Vitamin C (VITRON-C) 65-125 MG TABS Take 1 tablet by mouth daily.     leflunomide (ARAVA) 10 MG tablet Take 10 mg by mouth every other day.     Magnesium Oxide 250 MG TABS Take 250 mg by mouth 2 (two) times daily.     metFORMIN (GLUCOPHAGE-XR) 500 MG 24 hr tablet Take 500 mg by mouth every evening.     metoCLOPramide (REGLAN) 5 MG tablet Take 5 mg by mouth 4 (four) times daily.     metoprolol tartrate (LOPRESSOR) 50 MG tablet Take 50 mg by mouth 2 (two) times daily.     omeprazole (PRILOSEC) 40 MG capsule Take 40 mg by mouth daily.     Probiotic Product (PROBIOTIC-10 PO) Take 1 capsule by mouth daily.     rosuvastatin (CRESTOR) 10 MG tablet Take 10 mg by mouth daily.      torsemide (DEMADEX) 10 MG tablet Take 1 tablet by mouth daily.     triamcinolone (KENALOG) 0.025 % ointment Apply 1 application. topically 2 (two) times daily. (Patient taking differently: Apply 1 application  topically 2 (two) times daily as needed (irritation).) 30 g 0   vitamin B-12 (CYANOCOBALAMIN) 1000 MCG tablet Take 1,000 mcg by mouth daily.     No current facility-administered medications for this visit.   Facility-Administered Medications Ordered in Other Visits  Medication Dose Route Frequency Provider Last Rate Last Admin   epoetin alfa-epbx (RETACRIT) injection 20,000 Units  20,000 Units Subcutaneous Once Louretta Shorten R, MD         .  PHYSICAL EXAMINATION:   Vitals:   06/25/23 1310  BP: (!) 144/75  Pulse: 61  Resp: 19  Temp: (!) 96.5 F (35.8 C)  SpO2: 98%   Filed Weights   06/25/23 1310  Weight: 156 lb (70.8 kg)   Bilateral basilar crackles  Physical Exam Vitals and nursing note reviewed.  HENT:     Head: Normocephalic and atraumatic.     Mouth/Throat:     Pharynx: Oropharynx is clear.  Eyes:     Extraocular Movements: Extraocular movements intact.     Pupils: Pupils are equal, round, and reactive to  light.  Cardiovascular:     Rate and Rhythm: Normal rate and regular rhythm.  Pulmonary:     Comments: Decreased breath sounds  bilaterally.  Abdominal:     Palpations: Abdomen is soft.  Musculoskeletal:        General: Normal range of motion.     Cervical back: Normal range of motion.  Skin:    General: Skin is warm.  Neurological:     General: No focal deficit present.     Mental Status: She is alert and oriented to person, place, and time.  Psychiatric:        Behavior: Behavior normal.        Judgment: Judgment normal.      LABORATORY DATA:  I have reviewed the data as listed Lab Results  Component Value Date   WBC 11.6 (H) 06/25/2023   HGB 9.7 (L) 06/25/2023   HCT 31.7 (L) 06/25/2023   MCV 90.6 06/25/2023   PLT 261 06/25/2023   Recent Labs    03/27/23 1222 05/28/23 1002 06/25/23 1244  NA 135 133* 135  K 4.7 4.9 5.2*  CL 104 106 107  CO2 23 20* 22  GLUCOSE 146* 303* 213*  BUN 46* 45* 54*  CREATININE 2.19* 2.43* 2.53*  CALCIUM 9.1 8.8* 9.0  GFRNONAA 22* 20* 19*  PROT 7.0  --   --   ALBUMIN 3.7  --   --   AST 19  --   --   ALT 14  --   --   ALKPHOS 118  --   --   BILITOT 0.1*  --   --      No results found.  ASSESSMENT & PLAN:   Symptomatic anemia # Chronic anemia-normocytic JUNE 2024- Hb 9; Normal WBC/platelets- recently getting worse.  Patient is quite symptomatic from her anemia.  The etiology is likely chronic renal disease. Labs- 2024- FEB 2024- iron sat  27; no ferritin-[PCP].  haptoglobin; erythropoietin; B12 folic acid; reticulocyte count; hepatitis panel; HIV; multiple myeloma panel.  Kappa lambda light chain ratio- NEGATIVE.  #Hb today 9.7- continue vitron C twice a day [black stool]. And continue retacrit- q monthly.  # CKD stage IV [Dr.Kolluru/ Dr.Stoioff]c- hronic UPJ obstruction managed with an indwelling ureteral stent.  Continue follow-up with nephrology/urology.Mild hyperkalemia- 5.2- awaiting re-evaluation with Dr.Kolluru on 9/25.    # DISPOSITION: # Retacrit today-  # 4 weeks- lab- H&H; possible retacrit # follow up 8 weeks- APP- labs- cbc/bmp; possible venofer or retacrit # 10 weeks- lab- H&H; possible retacrit # follow up 12 weeks- MD ; labs- cbc/bmp; iron studies; ferritin; possible venofer or retacrit-- Dr.B  All questions were answered. The patient knows to call the clinic with any problems, questions or concerns.    Earna Coder, MD 06/25/2023 1:59 PM

## 2023-06-25 NOTE — Progress Notes (Signed)
Patient has no concerns 

## 2023-07-11 ENCOUNTER — Encounter: Payer: Self-pay | Admitting: Physician Assistant

## 2023-07-11 ENCOUNTER — Ambulatory Visit: Payer: Medicare Other | Admitting: Physician Assistant

## 2023-07-11 VITALS — BP 163/73 | HR 66 | Ht 72.0 in | Wt 156.0 lb

## 2023-07-11 DIAGNOSIS — R339 Retention of urine, unspecified: Secondary | ICD-10-CM

## 2023-07-11 DIAGNOSIS — Z466 Encounter for fitting and adjustment of urinary device: Secondary | ICD-10-CM

## 2023-07-11 NOTE — Progress Notes (Signed)
Cath Change/ Replacement  Patient is present today for a catheter change due to urinary retention.  8ml of water was removed from the balloon, a 16FR Silastic foley cath was removed without difficulty.  Patient was cleaned and prepped in a sterile fashion with betadine. A 16 FR Silastic foley cath was replaced into the bladder, no complications were noted. Urine return was not noted but the Foley was palpated within the urethra via the anterior vaginal wall. The balloon was filled with 10ml of sterile water. A leg bag was attached for drainage.  Patient tolerated well.    Performed by: Carman Ching, PA-C   Follow up: Return in about 4 weeks (around 08/08/2023) for Catheter exchange.

## 2023-07-23 ENCOUNTER — Inpatient Hospital Stay: Payer: Medicare Other

## 2023-07-23 ENCOUNTER — Inpatient Hospital Stay: Payer: Medicare Other | Attending: Internal Medicine

## 2023-07-23 DIAGNOSIS — Z79899 Other long term (current) drug therapy: Secondary | ICD-10-CM | POA: Insufficient documentation

## 2023-07-23 DIAGNOSIS — D631 Anemia in chronic kidney disease: Secondary | ICD-10-CM | POA: Insufficient documentation

## 2023-07-23 DIAGNOSIS — D649 Anemia, unspecified: Secondary | ICD-10-CM

## 2023-07-23 DIAGNOSIS — N184 Chronic kidney disease, stage 4 (severe): Secondary | ICD-10-CM | POA: Insufficient documentation

## 2023-07-23 LAB — HEMOGLOBIN AND HEMATOCRIT (CANCER CENTER ONLY)
HCT: 35.4 % — ABNORMAL LOW (ref 36.0–46.0)
Hemoglobin: 10.8 g/dL — ABNORMAL LOW (ref 12.0–15.0)

## 2023-07-23 NOTE — Progress Notes (Signed)
HbG is 10.8. Hold retacrit.

## 2023-08-13 ENCOUNTER — Ambulatory Visit: Payer: Medicare Other | Admitting: Physician Assistant

## 2023-08-14 ENCOUNTER — Ambulatory Visit (INDEPENDENT_AMBULATORY_CARE_PROVIDER_SITE_OTHER): Payer: Medicare Other | Admitting: Physician Assistant

## 2023-08-14 DIAGNOSIS — R339 Retention of urine, unspecified: Secondary | ICD-10-CM

## 2023-08-14 DIAGNOSIS — Z466 Encounter for fitting and adjustment of urinary device: Secondary | ICD-10-CM

## 2023-08-14 NOTE — Progress Notes (Signed)
Cath Change/ Replacement  Patient is present today for a catheter change due to urinary retention.  8ml of water was removed from the balloon, a 16FR Silastic foley cath was removed without difficulty.  Patient was cleaned and prepped in a sterile fashion with betadine and 2% lidocaine jelly was instilled into the urethra. A 16 FR Silastic foley cath was replaced into the bladder, no complications were noted. Urine return was noted 3ml and urine was yellow in color. The balloon was filled with 10ml of sterile water. A leg bag was attached for drainage.  Patient tolerated well.    Performed by: Carman Ching, PA-C   Follow up: Return in about 4 weeks (around 09/11/2023) for Catheter exchange.

## 2023-08-20 ENCOUNTER — Encounter: Payer: Self-pay | Admitting: Nurse Practitioner

## 2023-08-20 ENCOUNTER — Inpatient Hospital Stay: Payer: Medicare Other

## 2023-08-20 ENCOUNTER — Inpatient Hospital Stay (HOSPITAL_BASED_OUTPATIENT_CLINIC_OR_DEPARTMENT_OTHER): Payer: Medicare Other | Admitting: Nurse Practitioner

## 2023-08-20 ENCOUNTER — Inpatient Hospital Stay: Payer: Medicare Other | Attending: Internal Medicine

## 2023-08-20 VITALS — BP 144/92 | HR 61 | Temp 97.1°F | Resp 18 | Wt 155.0 lb

## 2023-08-20 VITALS — BP 189/88 | HR 58 | Resp 18

## 2023-08-20 DIAGNOSIS — E611 Iron deficiency: Secondary | ICD-10-CM | POA: Insufficient documentation

## 2023-08-20 DIAGNOSIS — E1122 Type 2 diabetes mellitus with diabetic chronic kidney disease: Secondary | ICD-10-CM | POA: Insufficient documentation

## 2023-08-20 DIAGNOSIS — N184 Chronic kidney disease, stage 4 (severe): Secondary | ICD-10-CM | POA: Diagnosis not present

## 2023-08-20 DIAGNOSIS — R531 Weakness: Secondary | ICD-10-CM | POA: Insufficient documentation

## 2023-08-20 DIAGNOSIS — M791 Myalgia, unspecified site: Secondary | ICD-10-CM | POA: Diagnosis not present

## 2023-08-20 DIAGNOSIS — M2550 Pain in unspecified joint: Secondary | ICD-10-CM | POA: Insufficient documentation

## 2023-08-20 DIAGNOSIS — M549 Dorsalgia, unspecified: Secondary | ICD-10-CM | POA: Diagnosis not present

## 2023-08-20 DIAGNOSIS — D631 Anemia in chronic kidney disease: Secondary | ICD-10-CM

## 2023-08-20 DIAGNOSIS — Z886 Allergy status to analgesic agent status: Secondary | ICD-10-CM | POA: Insufficient documentation

## 2023-08-20 DIAGNOSIS — D649 Anemia, unspecified: Secondary | ICD-10-CM

## 2023-08-20 DIAGNOSIS — E785 Hyperlipidemia, unspecified: Secondary | ICD-10-CM | POA: Diagnosis not present

## 2023-08-20 DIAGNOSIS — Z79899 Other long term (current) drug therapy: Secondary | ICD-10-CM | POA: Insufficient documentation

## 2023-08-20 DIAGNOSIS — R5383 Other fatigue: Secondary | ICD-10-CM | POA: Insufficient documentation

## 2023-08-20 DIAGNOSIS — Z885 Allergy status to narcotic agent status: Secondary | ICD-10-CM | POA: Diagnosis not present

## 2023-08-20 DIAGNOSIS — Z90710 Acquired absence of both cervix and uterus: Secondary | ICD-10-CM | POA: Insufficient documentation

## 2023-08-20 LAB — CBC WITH DIFFERENTIAL (CANCER CENTER ONLY)
Abs Immature Granulocytes: 0.03 10*3/uL (ref 0.00–0.07)
Basophils Absolute: 0.1 10*3/uL (ref 0.0–0.1)
Basophils Relative: 1 %
Eosinophils Absolute: 0.3 10*3/uL (ref 0.0–0.5)
Eosinophils Relative: 3 %
HCT: 32.5 % — ABNORMAL LOW (ref 36.0–46.0)
Hemoglobin: 9.8 g/dL — ABNORMAL LOW (ref 12.0–15.0)
Immature Granulocytes: 0 %
Lymphocytes Relative: 14 %
Lymphs Abs: 1.1 10*3/uL (ref 0.7–4.0)
MCH: 27.5 pg (ref 26.0–34.0)
MCHC: 30.2 g/dL (ref 30.0–36.0)
MCV: 91 fL (ref 80.0–100.0)
Monocytes Absolute: 0.7 10*3/uL (ref 0.1–1.0)
Monocytes Relative: 9 %
Neutro Abs: 5.8 10*3/uL (ref 1.7–7.7)
Neutrophils Relative %: 73 %
Platelet Count: 260 10*3/uL (ref 150–400)
RBC: 3.57 MIL/uL — ABNORMAL LOW (ref 3.87–5.11)
RDW: 14.5 % (ref 11.5–15.5)
WBC Count: 8 10*3/uL (ref 4.0–10.5)
nRBC: 0 % (ref 0.0–0.2)

## 2023-08-20 LAB — BASIC METABOLIC PANEL - CANCER CENTER ONLY
Anion gap: 10 (ref 5–15)
BUN: 43 mg/dL — ABNORMAL HIGH (ref 8–23)
CO2: 19 mmol/L — ABNORMAL LOW (ref 22–32)
Calcium: 8.8 mg/dL — ABNORMAL LOW (ref 8.9–10.3)
Chloride: 105 mmol/L (ref 98–111)
Creatinine: 2.42 mg/dL — ABNORMAL HIGH (ref 0.44–1.00)
GFR, Estimated: 20 mL/min — ABNORMAL LOW (ref >60)
Glucose, Bld: 329 mg/dL — ABNORMAL HIGH (ref 70–99)
Potassium: 4.8 mmol/L (ref 3.5–5.1)
Sodium: 134 mmol/L — ABNORMAL LOW (ref 135–145)

## 2023-08-20 MED ORDER — IRON SUCROSE 20 MG/ML IV SOLN
200.0000 mg | Freq: Once | INTRAVENOUS | Status: AC
Start: 2023-08-20 — End: 2023-08-20
  Administered 2023-08-20: 200 mg via INTRAVENOUS
  Filled 2023-08-20: qty 10

## 2023-08-20 NOTE — Progress Notes (Signed)
Dr Donneta Romberg is aware of patient BP. Dr B states that it is okay to discharge patient and have her monitor at home. Patient is aware and agrees.patient states she feels fine and is stable. Discharged home with husband.

## 2023-08-20 NOTE — Progress Notes (Unsigned)
[facility] CONSULT NOTE  Patient Care Team: Danella Penton, MD as PCP - General (Internal Medicine) Katrinka Blazing Jethro Poling, NP (Inactive) as Nurse Practitioner (Hospice and Palliative Medicine) Earna Coder, MD as Consulting Physician (Oncology)  CHIEF COMPLAINTS/PURPOSE OF CONSULTATION: ANEMIA  HEMATOLOGY HISTORY  # ANEMIA- [Hb; MCV-platelets- WBC; Iron sat; ferritin;  GFR- CT/US; EGD/colonoscopy-[in [place]]- 2022 sep [Dr.Wohl]  # CKD, stage IV- [Dr. 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   HISTORY OF PRESENTING ILLNESS: Patient ambulating with wheelchair. Accompanied by husband  Stephanie [name] 82 y.o.  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 Stoioff] diagnosed with anemia of chronic kidney disease and decreased iron stores, currently on oral iron, who returns to clinic for follow up. She is tolerating oral iron well without GI upset or constipation. She feels at baseline. She has chronic weakness which is unchanged. She had a 16 oz glass of chocolate milk with breakfast this morning. Not on insulin. Denies any neurologic complaints. Denies recent fevers or illnesses. Denies any easy bleeding or bruising. No melena or hematochezia. No pica or restless leg. Reports good appetite and denies weight loss. Denies chest pain. Denies any nausea, vomiting, constipation, or diarrhea. Denies urinary complaints. Patient offers no further specific complaints today.   Review of Systems  Constitutional:  Positive for malaise/fatigue. Negative for chills, diaphoresis, fever and weight loss.  HENT:  Negative for nosebleeds.   Respiratory:  Negative for cough, hemoptysis, sputum production, shortness of breath and wheezing.   Cardiovascular:  Negative for chest pain, palpitations, orthopnea and leg swelling.  Gastrointestinal:  Negative for abdominal pain,  blood in stool, constipation, diarrhea, heartburn, melena, nausea and vomiting.  Genitourinary:  Negative for dysuria, flank pain, frequency, hematuria and urgency.       Denies vaginal bleeding  Musculoskeletal:  Positive for back pain, joint pain and myalgias. Negative for falls.  Skin: Negative.  Negative for itching and rash.  Neurological:  Negative for dizziness, tingling, weakness and headaches.  Endo/Heme/Allergies:  Does not bruise/bleed easily.  Psychiatric/Behavioral:  Negative for depression. The patient is not nervous/anxious.     MEDICAL HISTORY:  Past Medical History:  Diagnosis Date   Anemia    Aortic atherosclerosis (HCC)    Arthritis    Asthma    Cancer (HCC) 1996   colon   Chronic kidney disease    Complication of anesthesia    nausea   Diabetes mellitus without complication (HCC)    Dysrhythmia    Foley catheter in place    GERD (gastroesophageal reflux disease)    Glaucoma    Hyperlipidemia    Hyperparathyroidism due to renal insufficiency (HCC)    Hypertension    Lumbar disc disease    Pneumonia    PONV (postoperative nausea and vomiting)    Rheumatoid arthritis involving multiple sites with positive rheumatoid factor (HCC)    Small bowel obstruction (HCC)    Stage 3b chronic kidney disease (CKD) (HCC)    Urinary retention    Uses walker    Vitamin B 12 deficiency    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    has, does not wear    SURGICAL HISTORY: Past Surgical History:  Procedure Laterality Date   ABDOMINAL HYSTERECTOMY     BACK SURGERY  2006   x5  "screws and rods"   BREAST BIOPSY Right 11/01/2021   rt br biopsy calcs  ribbon clip/ benign 2nd site LIQ   BREAST BIOPSY Right 11/01/2021   rt br stereo calcs coil clip 1st site / benign UIQ   COLON SURGERY     COLONOSCOPY WITH PROPOFOL N/A 05/22/2017   Procedure: COLONOSCOPY WITH PROPOFOL;  Surgeon: Scot Jun, MD;  Location: [facility] ENDOSCOPY;  Service: Endoscopy;  Laterality: N/A;   COLONOSCOPY  WITH PROPOFOL N/A 06/08/2021   Procedure: COLONOSCOPY WITH PROPOFOL;  Surgeon: Midge Minium, MD;  Location: [facility];  Service: Endoscopy;  Laterality: N/A;  Diabetic   CYSTOSCOPY W/ RETROGRADES Right 11/06/2022   Procedure: CYSTOSCOPY WITH RETROGRADE PYELOGRAM;  Surgeon: Riki Altes, MD;  Location: [facility] ORS;  Service: Urology;  Laterality: Right;   CYSTOSCOPY W/ URETERAL STENT PLACEMENT  07/25/2021   Procedure: CYSTOSCOPY WITH RETROGRADE PYELOGRAM/URETERAL STENT PLACEMENT;  Surgeon: Riki Altes, MD;  Location: [facility] ORS;  Service: Urology;;   CYSTOSCOPY W/ URETERAL STENT PLACEMENT  02/06/2022   Procedure: CYSTOSCOPY WITH RETROGRADE PYELOGRAM/URETERAL STENT EXCHANGE;  Surgeon: Riki Altes, MD;  Location: [facility] ORS;  Service: Urology;;   CYSTOSCOPY W/ URETERAL STENT PLACEMENT Right 11/06/2022   Procedure: CYSTOSCOPY WITH STENT EXCHANGE;  Surgeon: Riki Altes, MD;  Location: [facility] ORS;  Service: Urology;  Laterality: Right;   CYSTOSCOPY WITH STENT PLACEMENT Right 08/02/2020   Procedure: CYSTOSCOPY WITH STENT EXCHANGE;  Surgeon: Riki Altes, MD;  Location: [facility] ORS;  Service: Urology;  Laterality: Right;   CYSTOSCOPY WITH STENT PLACEMENT Right 06/18/2020   Procedure: CYSTOSCOPY WITH STENT PLACEMENT;  Surgeon: Bjorn Pippin, MD;  Location: [facility] ORS;  Service: Urology;  Laterality: Right;   EYE SURGERY Bilateral    Cataracts   FRACTURE SURGERY Right 2011   foot surgery   JOINT REPLACEMENT Bilateral    knees   KNEE ARTHROSCOPY Bilateral    LAPAROTOMY N/A 06/30/2020   Procedure: EXPLORATORY LAPAROTOMY;  Surgeon: Leafy Ro, MD;  Location: [facility] ORS;  Service: General;  Laterality: N/A;   LUMBAR LAMINECTOMY     POLYPECTOMY  06/08/2021   Procedure: POLYPECTOMY;  Surgeon: Midge Minium, MD;  Location: [facility];  Service: Endoscopy;;   TEMPORARY DIALYSIS CATHETER N/A 06/27/2020   Procedure: TEMPORARY DIALYSIS CATHETER;  Surgeon: Renford Dills, MD;   Location: [facility] INVASIVE CV LAB;  Service: Cardiovascular;  Laterality: N/A;    SOCIAL HISTORY: Social History   Socioeconomic History   Marital status: Married    Spouse name: [name]   Number of children: Not on file   Years of education: Not on file   Highest education level: Not on file  Occupational History   Not on file  Tobacco Use   Smoking status: Never   Smokeless tobacco: Never  Vaping Use   Vaping status: Never Used  Substance and Sexual Activity   Alcohol use: No   Drug use: No   Sexual activity: Yes  Other Topics Concern   Not on file  Social History Narrative   Not on file   Social Determinants of Health   Financial Resource Strain: Low Risk  (07/15/2023)   Received from [facility]   Overall Financial Resource Strain (CARDIA)    Difficulty of Paying Living Expenses: Not hard at all  Food Insecurity: No Food Insecurity (07/15/2023)   Received from [facility]   Hunger Vital Sign    Worried About Running Out of Food in the Last Year: Never true    Ran Out of Food in the Last Year: Never  true  Transportation Needs: No Transportation Needs (07/15/2023)   Received from [facility] - Transportation    In the past 12 months, has lack of transportation kept you from medical appointments or from getting medications?: No    Lack of Transportation (Non-Medical): No  Physical Activity: Not on file  Stress: Not on file  Social Connections: Not on file  Intimate Partner Violence: Not At Risk (03/27/2023)   Humiliation, Afraid, Rape, and Kick questionnaire    Fear of Current or Ex-Partner: No    Emotionally Abused: No    Physically Abused: No    Sexually Abused: No    FAMILY HISTORY: Family History  Problem Relation Age of Onset   Breast cancer Neg Hx     ALLERGIES:  is allergic to infliximab, ibuprofen, indomethacin, methotrexate derivatives, moexipril, percocet [oxycodone-acetaminophen], and  naprosyn [naproxen].   MEDICATIONS:  Current Outpatient Medications  Medication Sig Dispense Refill   acetaminophen (TYLENOL) 325 MG tablet Take 2 tablets (650 mg total) by mouth every 6 (six) hours as needed for moderate pain, fever or headache.     albuterol (VENTOLIN HFA) 108 (90 Base) MCG/ACT inhaler Inhale into the lungs every 6 (six) hours as needed for wheezing or shortness of breath.     ascorbic acid (VITAMIN C) 500 MG tablet Take 1 tablet (500 mg total) by mouth daily.     brimonidine (ALPHAGAN) 0.2 % ophthalmic solution Place 1 drop into both eyes 2 (two) times daily.     CONTOUR TEST test strip 1 each by Other route daily.   5   Cranberry 400 MG CAPS Take 400 mg by mouth daily.      diltiazem (CARDIZEM CD) 360 MG 24 hr capsule Take 1 capsule (360 mg total) by mouth in the morning and at bedtime. 30 capsule 0   fluticasone-salmeterol (ADVAIR) 100-50 MCG/ACT AEPB Inhale 1 puff into the lungs 2 (two) times daily.     glipiZIDE (GLUCOTROL) 10 MG tablet Take 5 mg by mouth daily.     hydrALAZINE (APRESOLINE) 25 MG tablet Take 1 tablet by mouth 2 (two) times daily.     Iron-Vitamin C (VITRON-C) 65-125 MG TABS Take 1 tablet by mouth daily.     leflunomide (ARAVA) 10 MG tablet Take 10 mg by mouth every other day.     Magnesium Oxide 250 MG TABS Take 250 mg by mouth 2 (two) times daily.     metFORMIN (GLUCOPHAGE-XR) 500 MG 24 hr tablet Take 500 mg by mouth every evening.     metoCLOPramide (REGLAN) 5 MG tablet Take 5 mg by mouth 4 (four) times daily.     metoprolol tartrate (LOPRESSOR) 50 MG tablet Take 50 mg by mouth 2 (two) times daily.     omeprazole (PRILOSEC) 40 MG capsule Take 40 mg by mouth daily.     Probiotic Product (PROBIOTIC-10 PO) Take 1 capsule by mouth daily.     rosuvastatin (CRESTOR) 10 MG tablet Take 10 mg by mouth daily.      torsemide (DEMADEX) 10 MG tablet Take 1 tablet by mouth daily.     triamcinolone (KENALOG) 0.025 % ointment Apply 1 application. topically 2 (two)  times daily. (Patient taking differently: Apply 1 application  topically 2 (two) times daily as needed (irritation).) 30 g 0   vitamin B-12 (CYANOCOBALAMIN) 1000 MCG tablet Take 1,000 mcg by mouth daily.     No current facility-administered medications for this visit.     PHYSICAL EXAMINATION:  Vitals:   08/20/23 0949  BP: (!) 144/92  Pulse: 61  Resp: 18  Temp: (!) 97.1 F (36.2 C)  SpO2: 99%   Filed Weights   08/20/23 0949  Weight: 155 lb (70.3 kg)   Physical Exam Vitals reviewed.  Constitutional:      Appearance: She is ill-appearing.     Comments: Chronically ill appearing. Frail.   HENT:     Head: Normocephalic and atraumatic.  Cardiovascular:     Rate and Rhythm: Normal rate and regular rhythm.  Pulmonary:     Effort: No respiratory distress.     Comments: Decreased breath sounds bilaterally.  Abdominal:     General: There is no distension.  Skin:    General: Skin is warm and dry.     Coloration: Skin is not pale.  Neurological:     Mental Status: She is alert and oriented to person, place, and time.  Psychiatric:        Mood and Affect: Mood normal.        Behavior: Behavior normal.     LABORATORY DATA:  I have reviewed the data as listed Lab Results  Component Value Date   WBC 8.0 08/20/2023   HGB 9.8 (L) 08/20/2023   HCT 32.5 (L) 08/20/2023   MCV 91.0 08/20/2023   PLT 260 08/20/2023   Recent Labs    03/27/23 1222 05/28/23 1002 06/25/23 1244 08/20/23 0925  NA 135 133* 135 134*  K 4.7 4.9 5.2* 4.8  CL 104 106 107 105  CO2 23 20* 22 19*  GLUCOSE 146* 303* 213* 329*  BUN 46* 45* 54* 43*  CREATININE 2.19* 2.43* 2.53* 2.42*  CALCIUM 9.1 8.8* 9.0 8.8*  GFRNONAA 22* 20* 19* 20*  PROT 7.0  --   --   --   ALBUMIN 3.7  --   --   --   AST 19  --   --   --   ALT 14  --   --   --   ALKPHOS 118  --   --   --   BILITOT 0.1*  --   --   --    Iron/TIBC/Ferritin/ %Sat    Component Value Date/Time   IRON 32 06/25/2023 1244   TIBC 249 (L) 06/25/2023  1244   FERRITIN 164 06/25/2023 1244   IRONPCTSAT 13 06/25/2023 1244   No results found.  ASSESSMENT & PLAN:   # Chronic anemia- normocytic JUNE 2024- Hb 9; Normal WBC/platelets- Worsening. Symptomatic. Workup with Dr Donneta Romberg most consistent with anemia secondary to her chronic renal disease. Today we again reviewed the pathophysiology of anemia from chronic kidney disease and impact on erythropoietin production and decreased iron stores. Also reviewed that worsening kidney disease often results in worsening anemia without intervention. She was reluctant to start IV iron and startedoral iron. Hemoglobin worsened to 8.8/baseline 9. Started retacrit 20,000 units with goal of maintaining hemoglobin > 10, minimizing transfusions. Tolerating well. Hemoglobin 9.8 today. Hold retacrit. Proceed with venofer.   # Iron Deficiency- Reviewed plan to optimize iron stores in setting of CKD. On oral iron biglycinate daily. Plan for single dose of IV iron today, venofer 200 mg. Continue oral iron.   # CKD stage IV [Dr.Kolluru/ Dr.Stoioff]- chronic UPJ obstruction managed with an indwelling ureteral stent.  Continue follow-up with nephrology/urology. GFR 20 today.   # Hyperglycemia- glucose 303 today. Reviewed need for carbohydrate restriction in her diet. Reviewed dietary sources of sugar. Provided sample of glucerna chocolate.  Thank you Dr. Wynelle Link for allowing me to participate in the care of your pleasant patient. Please do not hesitate to contact me with questions or concerns in the interim.   DISPOSITION: Venofer today Follow up as scheduled.   No problem-specific Assessment & Plan notes found for this encounter.  All questions were answered. The patient knows to call the clinic with any problems, questions or concerns.  Alinda Dooms, NP 08/20/2023

## 2023-08-21 ENCOUNTER — Encounter: Payer: Self-pay | Admitting: Internal Medicine

## 2023-09-03 ENCOUNTER — Inpatient Hospital Stay: Payer: Medicare Other | Attending: Internal Medicine

## 2023-09-03 ENCOUNTER — Inpatient Hospital Stay: Payer: Medicare Other

## 2023-09-03 VITALS — BP 170/81 | HR 60

## 2023-09-03 DIAGNOSIS — D631 Anemia in chronic kidney disease: Secondary | ICD-10-CM | POA: Diagnosis present

## 2023-09-03 DIAGNOSIS — Z79899 Other long term (current) drug therapy: Secondary | ICD-10-CM | POA: Insufficient documentation

## 2023-09-03 DIAGNOSIS — D649 Anemia, unspecified: Secondary | ICD-10-CM

## 2023-09-03 DIAGNOSIS — Z885 Allergy status to narcotic agent status: Secondary | ICD-10-CM | POA: Insufficient documentation

## 2023-09-03 DIAGNOSIS — E1122 Type 2 diabetes mellitus with diabetic chronic kidney disease: Secondary | ICD-10-CM | POA: Insufficient documentation

## 2023-09-03 DIAGNOSIS — R5383 Other fatigue: Secondary | ICD-10-CM | POA: Diagnosis not present

## 2023-09-03 DIAGNOSIS — N184 Chronic kidney disease, stage 4 (severe): Secondary | ICD-10-CM | POA: Insufficient documentation

## 2023-09-03 DIAGNOSIS — Z886 Allergy status to analgesic agent status: Secondary | ICD-10-CM | POA: Diagnosis not present

## 2023-09-03 DIAGNOSIS — K219 Gastro-esophageal reflux disease without esophagitis: Secondary | ICD-10-CM | POA: Insufficient documentation

## 2023-09-03 DIAGNOSIS — K921 Melena: Secondary | ICD-10-CM | POA: Diagnosis not present

## 2023-09-03 DIAGNOSIS — Z90710 Acquired absence of both cervix and uterus: Secondary | ICD-10-CM | POA: Diagnosis not present

## 2023-09-03 LAB — HEMOGLOBIN AND HEMATOCRIT (CANCER CENTER ONLY)
HCT: 33.5 % — ABNORMAL LOW (ref 36.0–46.0)
Hemoglobin: 10.2 g/dL — ABNORMAL LOW (ref 12.0–15.0)

## 2023-09-03 MED ORDER — EPOETIN ALFA-EPBX 20000 UNIT/ML IJ SOLN: 20000.0000 [IU] | Freq: Once | INTRAMUSCULAR | Status: DC

## 2023-09-03 NOTE — Addendum Note (Signed)
Addended by: Cheron Every E on: 09/03/2023 11:35 AM   Modules accepted: Orders

## 2023-09-03 NOTE — Progress Notes (Signed)
Patient's hgb 10.2 today, retacrit not given today. Confirmed with pharmacy and DR. B's nurse that parameter to give if < 10 for kidney anemia not 11. Patient informed, discharged stable

## 2023-09-10 ENCOUNTER — Ambulatory Visit: Payer: Medicare Other | Admitting: Physician Assistant

## 2023-09-10 ENCOUNTER — Other Ambulatory Visit: Payer: Self-pay

## 2023-09-10 DIAGNOSIS — Z466 Encounter for fitting and adjustment of urinary device: Secondary | ICD-10-CM

## 2023-09-10 DIAGNOSIS — Y828 Other medical devices associated with adverse incidents: Secondary | ICD-10-CM | POA: Diagnosis not present

## 2023-09-10 DIAGNOSIS — R339 Retention of urine, unspecified: Secondary | ICD-10-CM | POA: Diagnosis not present

## 2023-09-10 DIAGNOSIS — T83091A Other mechanical complication of indwelling urethral catheter, initial encounter: Secondary | ICD-10-CM | POA: Insufficient documentation

## 2023-09-10 NOTE — Progress Notes (Signed)
Cath Change/ Replacement  Patient is present today for a catheter change due to urinary retention.  8ml of water was removed from the balloon, a 16FR Silastic foley cath was removed without difficulty.  Patient was cleaned and prepped in a sterile fashion with betadine. A 18 FR Silastic foley cath was replaced into the bladder, no complications were noted. Urine return was noted 5ml and urine was yellow in color. The balloon was filled with 10ml of sterile water. A leg bag was attached for drainage.  Patient tolerated well.   Performed by: Carman Ching, PA-C   Follow up: Return in about 4 weeks (around 10/08/2023) for Catheter exchange.

## 2023-09-10 NOTE — ED Triage Notes (Signed)
Pt reports she had a foley catheter replaced earlier today, pt has chronic foley use x3 years. Pt repots foley fell out tonight. Pt denies pain or discomfort.

## 2023-09-11 ENCOUNTER — Emergency Department
Admission: EM | Admit: 2023-09-11 | Discharge: 2023-09-11 | Disposition: A | Discharge status: Home / Self Care | Payer: Medicare Other | Attending: Emergency Medicine | Admitting: Emergency Medicine

## 2023-09-11 DIAGNOSIS — T83021A Displacement of indwelling urethral catheter, initial encounter: Secondary | ICD-10-CM

## 2023-09-11 DIAGNOSIS — T83091A Other mechanical complication of indwelling urethral catheter, initial encounter: Secondary | ICD-10-CM | POA: Diagnosis not present

## 2023-09-11 NOTE — ED Provider Notes (Signed)
[facility] Provider Note    Event Date/Time   First MD Initiated Contact with Patient 09/11/23 0111     (approximate)   History   Foley Catheter Problem   HPI  Stephanie [name] is a 82 y.o. female past medical history significant for urinary retention, who presents to the emergency department with dislodged Foley catheter.  Patient has an indwelling Foley catheter that was exchanged today at her urologist office.  States that when she got home her Foley catheter dislodged.  States that the balloon was deflated.  Denies any abdominal pain or discomfort.  States that its only been out for approximately 1 or 2 hours.  No nausea or vomiting.     Physical Exam   Triage Vital Signs: ED Triage Vitals  Encounter Vitals Group     BP 09/10/23 2316 (!) 186/79     Systolic BP Percentile --      Diastolic BP Percentile --      Pulse Rate 09/10/23 2316 (!) 56     Resp 09/10/23 2316 18     Temp 09/10/23 2316 97.8 F (36.6 C)     Temp Source 09/10/23 2316 Oral     SpO2 09/10/23 2316 99 %     Weight 09/10/23 2315 152 lb (68.9 kg)     Height 09/10/23 2315 5\' 6"  (1.676 m)     Head Circumference --      Peak Flow --      Pain Score 09/10/23 2315 0     Pain Loc --      Pain Education --      Exclude from Growth Chart --     Most recent vital signs: Vitals:   09/10/23 2316  BP: (!) 186/79  Pulse: (!) 56  Resp: 18  Temp: 97.8 F (36.6 C)  SpO2: 99%    Physical Exam Constitutional:      Appearance: She is well-developed.  HENT:     Head: Atraumatic.  Eyes:     Conjunctiva/sclera: Conjunctivae normal.  Cardiovascular:     Rate and Rhythm: Regular rhythm.  Pulmonary:     Effort: No respiratory distress.  Abdominal:     General: There is no distension.  Genitourinary:    Comments: Foley catheter replaced Musculoskeletal:        General: Normal range of motion.     Cervical back: Normal range of motion.  Skin:    General: Skin is warm.   Neurological:     Mental Status: She is alert. Mental status is at baseline.      IMPRESSION / MDM / ASSESSMENT AND PLAN / ED COURSE  I reviewed the triage vital signs and the nursing notes.  Clinical picture concerning for Foley catheter that has been dislodged, patient states the balloon was deflated   Labs (all labs ordered are listed, but only abnormal results are displayed) Labs interpreted as -    Labs Reviewed - No data to display  indwelling Foley catheter was placed with significant improvement in urine output.  Given a leg bag.  Patient without any signs or symptoms of urinary tract infection.  Discussed close follow-up with urologist.  Given return precautions for any worsening or ongoing symptoms.     PROCEDURES:  Critical Care performed: No  Procedures  Patient's presentation is most consistent with acute complicated illness / injury requiring diagnostic workup.   MEDICATIONS ORDERED IN ED: Medications - No data to display  FINAL CLINICAL IMPRESSION(S) /  ED DIAGNOSES   Final diagnoses:  Displacement of Foley catheter, initial encounter [facility])     Rx / DC Orders   ED Discharge Orders     None        Note:  This document was prepared using Dragon voice recognition software and may include unintentional dictation errors.   Corena Herter, MD 09/11/23 [phone]

## 2023-09-17 ENCOUNTER — Inpatient Hospital Stay: Payer: Medicare Other

## 2023-09-17 ENCOUNTER — Inpatient Hospital Stay (HOSPITAL_BASED_OUTPATIENT_CLINIC_OR_DEPARTMENT_OTHER): Payer: Medicare Other | Admitting: Internal Medicine

## 2023-09-17 ENCOUNTER — Encounter: Payer: Self-pay | Admitting: Internal Medicine

## 2023-09-17 DIAGNOSIS — N184 Chronic kidney disease, stage 4 (severe): Secondary | ICD-10-CM | POA: Diagnosis not present

## 2023-09-17 DIAGNOSIS — D649 Anemia, unspecified: Secondary | ICD-10-CM

## 2023-09-17 LAB — BASIC METABOLIC PANEL - CANCER CENTER ONLY
Anion gap: 6 (ref 5–15)
BUN: 46 mg/dL — ABNORMAL HIGH (ref 8–23)
CO2: 20 mmol/L — ABNORMAL LOW (ref 22–32)
Calcium: 8.6 mg/dL — ABNORMAL LOW (ref 8.9–10.3)
Chloride: 111 mmol/L (ref 98–111)
Creatinine: 2.42 mg/dL — ABNORMAL HIGH (ref 0.44–1.00)
GFR, Estimated: 20 mL/min — ABNORMAL LOW (ref >60)
Glucose, Bld: 238 mg/dL — ABNORMAL HIGH (ref 70–99)
Potassium: 4.9 mmol/L (ref 3.5–5.1)
Sodium: 137 mmol/L (ref 135–145)

## 2023-09-17 LAB — CBC WITH DIFFERENTIAL (CANCER CENTER ONLY)
Abs Immature Granulocytes: 0.04 10*3/uL (ref 0.00–0.07)
Basophils Absolute: 0.1 10*3/uL (ref 0.0–0.1)
Basophils Relative: 1 %
Eosinophils Absolute: 0.2 10*3/uL (ref 0.0–0.5)
Eosinophils Relative: 2 %
HCT: 31.4 % — ABNORMAL LOW (ref 36.0–46.0)
Hemoglobin: 9.6 g/dL — ABNORMAL LOW (ref 12.0–15.0)
Immature Granulocytes: 0 %
Lymphocytes Relative: 12 %
Lymphs Abs: 1.1 10*3/uL (ref 0.7–4.0)
MCH: 28.1 pg (ref 26.0–34.0)
MCHC: 30.6 g/dL (ref 30.0–36.0)
MCV: 91.8 fL (ref 80.0–100.0)
Monocytes Absolute: 0.7 10*3/uL (ref 0.1–1.0)
Monocytes Relative: 8 %
Neutro Abs: 7 10*3/uL (ref 1.7–7.7)
Neutrophils Relative %: 77 %
Platelet Count: 225 10*3/uL (ref 150–400)
RBC: 3.42 MIL/uL — ABNORMAL LOW (ref 3.87–5.11)
RDW: 15.2 % (ref 11.5–15.5)
WBC Count: 9.2 10*3/uL (ref 4.0–10.5)
nRBC: 0 % (ref 0.0–0.2)

## 2023-09-17 LAB — IRON AND TIBC
Iron: 66 ug/dL (ref 28–170)
Saturation Ratios: 26 % (ref 10.4–31.8)
TIBC: 256 ug/dL (ref 250–450)
UIBC: 190 ug/dL

## 2023-09-17 LAB — FERRITIN: Ferritin: 212 ng/mL (ref 11–307)

## 2023-09-17 MED ORDER — EPOETIN ALFA-EPBX 20000 UNIT/ML IJ SOLN
20000.0000 [IU] | Freq: Once | INTRAMUSCULAR | Status: AC
  Administered 2023-09-17: 20000 [IU] via SUBCUTANEOUS

## 2023-09-17 NOTE — Assessment & Plan Note (Addendum)
#   Chronic anemia-normocytic JUNE 2024- Hb 9; Normal WBC/platelets- recently getting worse.  Patient is quite symptomatic from her anemia.  The etiology is likely chronic renal disease. Labs- 2024- FEB 2024- iron sat  27; no ferritin-[PCP].  haptoglobin; erythropoietin; B12 folic acid; reticulocyte count; hepatitis panel; HIV; multiple myeloma panel.  Kappa lambda light chain ratio- NEGATIVE.  # SEP 2024- I sat 13%; ferritin- 164- Hb today 9.8- continue vitron C twice a day [black stool]. And continue retacrit- q monthly. HOLD venofer [elevated Blood pressure post venofer infusion].    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   # DISPOSITION: # Retacrit today; NO venofer  # 4 weeks- lab- H&H; possible retacrit # follow up 8 weeks- APP- labs- cbc/bmp; possible venofer or retacrit # 12 weeks- lab- H&H; possible retacrit # follow up 16  weeks- MD ; labs- cbc/bmp; iron studies; ferritin; possible venofer or retacrit-- Dr.B

## 2023-09-17 NOTE — Progress Notes (Signed)
[facility] CONSULT NOTE  Patient Care Team: Danella Penton, MD as PCP - General (Internal Medicine) Katrinka Blazing Jethro Poling, NP (Inactive) as Nurse Practitioner (Hospice and Palliative Medicine) Earna Coder, MD as Consulting Physician (Oncology)  CHIEF COMPLAINTS/PURPOSE OF CONSULTATION: ANEMIA   HEMATOLOGY HISTORY  # ANEMIA[Hb; MCV-platelets- WBC; Iron sat; ferritin;  GFR- CT/US; EGD/colonoscopy-[in [place]]- 2022 sep [Dr.Wohl]  # CKD- [Dr.]  HISTORY OF PRESENTING ILLNESS: Patient ambulating-with assistance. Accompanied by husband  Stephanie [name] 82 y.o.  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 Stoioff] is here for follow-up of anemia.  Patient plaints of chronic dyspnea on walking.  Ambulates with a walker at home. Appetite is fair. Denies any dizziness. Has fatigue. No blood in stool that is visible.  Patient admits to compliance with her oral iron.   Review of Systems  Constitutional:  Positive for malaise/fatigue. Negative for chills, diaphoresis, fever and weight loss.  HENT:  Negative for nosebleeds and sore throat.   Eyes:  Negative for double vision.  Respiratory:  Negative for cough, hemoptysis, sputum production, shortness of breath and wheezing.   Cardiovascular:  Negative for chest pain, palpitations, orthopnea and leg swelling.  Gastrointestinal:  Negative for abdominal pain, blood in stool, constipation, diarrhea, heartburn, melena, nausea and vomiting.  Genitourinary:  Negative for dysuria, frequency and urgency.  Musculoskeletal:  Negative for back pain and joint pain.  Skin: Negative.  Negative for itching and rash.  Neurological:  Negative for dizziness, tingling, focal weakness, weakness and headaches.  Endo/Heme/Allergies:  Does not bruise/bleed easily.  Psychiatric/Behavioral:  Negative for depression. The patient is not nervous/anxious and does  not have insomnia.      MEDICAL HISTORY:  Past Medical History:  Diagnosis Date   Anemia    Aortic atherosclerosis (HCC)    Arthritis    Asthma    Cancer (HCC) 1996   colon   Chronic kidney disease    Complication of anesthesia    nausea   Diabetes mellitus without complication (HCC)    Dysrhythmia    Foley catheter in place    GERD (gastroesophageal reflux disease)    Glaucoma    Hyperlipidemia    Hyperparathyroidism due to renal insufficiency (HCC)    Hypertension    Lumbar disc disease    Pneumonia    PONV (postoperative nausea and vomiting)    Rheumatoid arthritis involving multiple sites with positive rheumatoid factor (HCC)    Small bowel obstruction (HCC)    Stage 3b chronic kidney disease (CKD) (HCC)    Urinary retention    Uses walker    Vitamin B 12 deficiency    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    has, does not wear    SURGICAL HISTORY: Past Surgical History:  Procedure Laterality Date   ABDOMINAL HYSTERECTOMY     BACK SURGERY  2006   x5  "screws and rods"   BREAST BIOPSY Right 11/01/2021   rt br biopsy calcs ribbon clip/ benign 2nd site LIQ   BREAST BIOPSY Right 11/01/2021   rt br stereo calcs coil clip 1st site / benign UIQ   COLON SURGERY     COLONOSCOPY WITH PROPOFOL N/A 05/22/2017   Procedure: COLONOSCOPY WITH PROPOFOL;  Surgeon: Scot Jun, MD;  Location: [facility] ENDOSCOPY;  Service: Endoscopy;  Laterality: N/A;   COLONOSCOPY WITH PROPOFOL N/A 06/08/2021   Procedure: COLONOSCOPY WITH PROPOFOL;  Surgeon: Midge Minium, MD;  Location: [facility];  Service: Endoscopy;  Laterality: N/A;  Diabetic   CYSTOSCOPY W/ RETROGRADES Right 11/06/2022   Procedure: CYSTOSCOPY WITH RETROGRADE PYELOGRAM;  Surgeon: Riki Altes, MD;  Location: [facility] ORS;  Service: Urology;  Laterality: Right;   CYSTOSCOPY W/ URETERAL STENT PLACEMENT  07/25/2021   Procedure: CYSTOSCOPY WITH RETROGRADE PYELOGRAM/URETERAL STENT PLACEMENT;  Surgeon: Riki Altes, MD;   Location: [facility];  Service: Urology;;   CYSTOSCOPY W/ URETERAL STENT PLACEMENT  02/06/2022   Procedure: CYSTOSCOPY WITH RETROGRADE PYELOGRAM/URETERAL STENT EXCHANGE;  Surgeon: Riki Altes, MD;  Location: [facility];  Service: Urology;;   CYSTOSCOPY W/ URETERAL STENT PLACEMENT Right 11/06/2022   Procedure: CYSTOSCOPY WITH STENT EXCHANGE;  Surgeon: Riki Altes, MD;  Location: [facility];  Service: Urology;  Laterality: Right;   CYSTOSCOPY WITH STENT PLACEMENT Right 08/02/2020   Procedure: CYSTOSCOPY WITH STENT EXCHANGE;  Surgeon: Riki Altes, MD;  Location: [facility];  Service: Urology;  Laterality: Right;   CYSTOSCOPY WITH STENT PLACEMENT Right 06/18/2020   Procedure: CYSTOSCOPY WITH STENT PLACEMENT;  Surgeon: Bjorn Pippin, MD;  Location: [facility];  Service: Urology;  Laterality: Right;   EYE SURGERY Bilateral    Cataracts   FRACTURE SURGERY Right 2011   foot surgery   JOINT REPLACEMENT Bilateral    knees   KNEE ARTHROSCOPY Bilateral    LAPAROTOMY N/A 06/30/2020   Procedure: EXPLORATORY LAPAROTOMY;  Surgeon: Leafy Ro, MD;  Location: [facility];  Service: General;  Laterality: N/A;   LUMBAR LAMINECTOMY     POLYPECTOMY  06/08/2021   Procedure: POLYPECTOMY;  Surgeon: Midge Minium, MD;  Location: [facility];  Service: Endoscopy;;   TEMPORARY DIALYSIS CATHETER N/A 06/27/2020   Procedure: TEMPORARY DIALYSIS CATHETER;  Surgeon: Renford Dills, MD;  Location: [facility];  Service: Cardiovascular;  Laterality: N/A;    SOCIAL HISTORY: Social History   Socioeconomic History   Marital status: Married    Spouse name: [name]   Number of children: Not on file   Years of education: Not on file   Highest education level: Not on file  Occupational History   Not on file  Tobacco Use   Smoking status: Never   Smokeless tobacco: Never  Vaping Use   Vaping status: Never Used  Substance and Sexual Activity   Alcohol use: No   Drug use: No   Sexual activity: Yes   Other Topics Concern   Not on file  Social History Narrative   Not on file   Social Drivers of Health   Financial Resource Strain: Low Risk  (09/05/2023)   Received from [facility]   Overall Financial Resource Strain (CARDIA)    Difficulty of Paying Living Expenses: Not hard at all  Food Insecurity: No Food Insecurity (09/05/2023)   Received from [facility]   Hunger Vital Sign    Worried About Running Out of Food in the Last Year: Never true    Ran Out of Food in the Last Year: Never true  Transportation Needs: No Transportation Needs (09/05/2023)   Received from [facility] - Transportation    In the past 12 months, has lack of transportation kept you from medical appointments or from getting medications?: No    Lack of Transportation (Non-Medical): No  Physical Activity: Not on file  Stress: Not on file  Social Connections: Not on file  Intimate Partner Violence: Not At Risk (03/27/2023)   Humiliation,  Afraid, Rape, and Kick questionnaire    Fear of Current or Ex-Partner: No    Emotionally Abused: No    Physically Abused: No    Sexually Abused: No    FAMILY HISTORY: Family History  Problem Relation Age of Onset   Breast cancer Neg Hx     ALLERGIES:  is allergic to infliximab, ibuprofen, indomethacin, methotrexate derivatives, moexipril, percocet [oxycodone-acetaminophen], and naprosyn [naproxen].  MEDICATIONS:  Current Outpatient Medications  Medication Sig Dispense Refill   acetaminophen (TYLENOL) 325 MG tablet Take 2 tablets (650 mg total) by mouth every 6 (six) hours as needed for moderate pain, fever or headache.     albuterol (VENTOLIN HFA) 108 (90 Base) MCG/ACT inhaler Inhale into the lungs every 6 (six) hours as needed for wheezing or shortness of breath.     ascorbic acid (VITAMIN C) 500 MG tablet Take 1 tablet (500 mg total) by mouth daily.     brimonidine (ALPHAGAN) 0.2 % ophthalmic solution  Place 1 drop into both eyes 2 (two) times daily.     CONTOUR TEST test strip 1 each by Other route daily.   5   Cranberry 400 MG CAPS Take 400 mg by mouth daily.      diltiazem (CARDIZEM CD) 360 MG 24 hr capsule Take 1 capsule (360 mg total) by mouth in the morning and at bedtime. 30 capsule 0   fluticasone-salmeterol (ADVAIR) 100-50 MCG/ACT AEPB Inhale 1 puff into the lungs 2 (two) times daily.     glipiZIDE (GLUCOTROL) 10 MG tablet Take 5 mg by mouth daily.     hydrALAZINE (APRESOLINE) 25 MG tablet Take 1 tablet by mouth 2 (two) times daily.     Iron-Vitamin C (VITRON-C) 65-125 MG TABS Take 1 tablet by mouth daily.     leflunomide (ARAVA) 10 MG tablet Take 10 mg by mouth every other day.     Magnesium Oxide 250 MG TABS Take 250 mg by mouth 2 (two) times daily.     metoprolol tartrate (LOPRESSOR) 50 MG tablet Take 50 mg by mouth 2 (two) times daily.     omeprazole (PRILOSEC) 40 MG capsule Take 40 mg by mouth daily.     Probiotic Product (PROBIOTIC-10 PO) Take 1 capsule by mouth daily.     rosuvastatin (CRESTOR) 10 MG tablet Take 10 mg by mouth daily.      torsemide (DEMADEX) 10 MG tablet Take 1 tablet by mouth daily.     triamcinolone (KENALOG) 0.025 % ointment Apply 1 application. topically 2 (two) times daily. (Patient taking differently: Apply 1 application  topically 2 (two) times daily as needed (irritation).) 30 g 0   vitamin B-12 (CYANOCOBALAMIN) 1000 MCG tablet Take 1,000 mcg by mouth daily.     metFORMIN (GLUCOPHAGE-XR) 500 MG 24 hr tablet Take 500 mg by mouth every evening. (Patient not taking: Reported on 09/17/2023)     No current facility-administered medications for this visit.     Marland Kitchen  PHYSICAL EXAMINATION:   Vitals:   09/17/23 1018  BP: (!) 170/70  Pulse: (!) 58  Resp: 17  Temp: 98.6 F (37 C)  SpO2: 97%   Filed Weights   09/17/23 1018  Weight: 156 lb (70.8 kg)   Bilateral basilar crackles  Physical Exam Vitals and nursing note reviewed.  HENT:     Head:  Normocephalic and atraumatic.     Mouth/Throat:     Pharynx: Oropharynx is clear.  Eyes:     Extraocular Movements: Extraocular movements intact.  Pupils: Pupils are equal, round, and reactive to light.  Cardiovascular:     Rate and Rhythm: Normal rate and regular rhythm.  Pulmonary:     Comments: Decreased breath sounds bilaterally.  Abdominal:     Palpations: Abdomen is soft.  Musculoskeletal:        General: Normal range of motion.     Cervical back: Normal range of motion.  Skin:    General: Skin is warm.  Neurological:     General: No focal deficit present.     Mental Status: She is alert and oriented to person, place, and time.  Psychiatric:        Behavior: Behavior normal.        Judgment: Judgment normal.      LABORATORY DATA:  I have reviewed the data as listed Lab Results  Component Value Date   WBC 9.2 09/17/2023   HGB 9.6 (L) 09/17/2023   HCT 31.4 (L) 09/17/2023   MCV 91.8 09/17/2023   PLT 225 09/17/2023   Recent Labs    03/27/23 1222 05/28/23 1002 06/25/23 1244 08/20/23 0925 09/17/23 1003  NA 135   < > 135 134* 137  K 4.7   < > 5.2* 4.8 4.9  CL 104   < > 107 105 111  CO2 23   < > 22 19* 20*  GLUCOSE 146*   < > 213* 329* 238*  BUN 46*   < > 54* 43* 46*  CREATININE 2.19*   < > 2.53* 2.42* 2.42*  CALCIUM 9.1   < > 9.0 8.8* 8.6*  GFRNONAA 22*   < > 19* 20* 20*  PROT 7.0  --   --   --   --   ALBUMIN 3.7  --   --   --   --   AST 19  --   --   --   --   ALT 14  --   --   --   --   ALKPHOS 118  --   --   --   --   BILITOT 0.1*  --   --   --   --    < > = values in this interval not displayed.     No results found.  ASSESSMENT & PLAN:   Symptomatic anemia # Chronic anemia-normocytic JUNE 2024- Hb 9; Normal WBC/platelets- recently getting worse.  Patient is quite symptomatic from her anemia.  The etiology is likely chronic renal disease. Labs- 2024- FEB 2024- iron sat  27; no ferritin-[PCP].  haptoglobin; erythropoietin; B12 folic acid;  reticulocyte count; hepatitis panel; HIV; multiple myeloma panel.  Kappa lambda light chain ratio- NEGATIVE.  # SEP 2024- I sat 13%; ferritin- 164- Hb today 9.8- continue vitron C twice a day [black stool]. And continue retacrit- q monthly. HOLD venofer [elevated Blood pressure post venofer infusion].    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   # DISPOSITION: # Retacrit today; NO venofer  # 4 weeks- lab- H&H; possible retacrit # follow up 8 weeks- APP- labs- cbc/bmp; possible venofer or retacrit # 12 weeks- lab- H&H; possible retacrit # follow up 16  weeks- MD ; labs- cbc/bmp; iron studies; ferritin; possible venofer or retacrit-- Dr.B   All questions were answered. The patient knows to call the clinic with any problems, questions or concerns.    Earna Coder, MD 09/17/2023 11:17 AM

## 2023-09-17 NOTE — Progress Notes (Signed)
Dyspnea with walking. Ambulates with a walker at home. Appetite is fair. Denies any dizziness. Has fatigue. No blood in stool that is visible.

## 2023-10-10 ENCOUNTER — Ambulatory Visit (INDEPENDENT_AMBULATORY_CARE_PROVIDER_SITE_OTHER): Payer: Medicare Other | Admitting: Physician Assistant

## 2023-10-10 VITALS — Ht 66.0 in | Wt 156.0 lb

## 2023-10-10 DIAGNOSIS — R339 Retention of urine, unspecified: Secondary | ICD-10-CM | POA: Diagnosis not present

## 2023-10-10 DIAGNOSIS — Z466 Encounter for fitting and adjustment of urinary device: Secondary | ICD-10-CM

## 2023-10-10 NOTE — Progress Notes (Signed)
 Cath Change/ Replacement  Patient is present today for a catheter change due to urinary retention.  8ml of water  was removed from the balloon, a 16FR foley cath was removed without difficulty.  Patient was cleaned and prepped in a sterile fashion with betadine. A 18 FR Silastic foley cath was replaced into the bladder, no complications were noted. Urine return was noted 2ml and urine was yellow in color. The balloon was filled with 10ml of sterile water . A leg bag was attached for drainage.  Patient tolerated well.   Performed by: Rye Decoste, PA-C   Follow up: Return in about 4 weeks (around 11/07/2023) for Catheter exchange.

## 2023-10-11 ENCOUNTER — Ambulatory Visit: Payer: Medicare Other | Admitting: Physician Assistant

## 2023-10-15 ENCOUNTER — Inpatient Hospital Stay: Payer: Medicare Other

## 2023-10-15 ENCOUNTER — Inpatient Hospital Stay: Payer: Medicare Other | Attending: Internal Medicine

## 2023-10-15 VITALS — BP 140/66 | HR 63

## 2023-10-15 DIAGNOSIS — N184 Chronic kidney disease, stage 4 (severe): Secondary | ICD-10-CM | POA: Diagnosis present

## 2023-10-15 DIAGNOSIS — D631 Anemia in chronic kidney disease: Secondary | ICD-10-CM | POA: Diagnosis present

## 2023-10-15 DIAGNOSIS — Z79899 Other long term (current) drug therapy: Secondary | ICD-10-CM | POA: Diagnosis not present

## 2023-10-15 DIAGNOSIS — E1122 Type 2 diabetes mellitus with diabetic chronic kidney disease: Secondary | ICD-10-CM | POA: Insufficient documentation

## 2023-10-15 DIAGNOSIS — D649 Anemia, unspecified: Secondary | ICD-10-CM

## 2023-10-15 LAB — HEMOGLOBIN AND HEMATOCRIT (CANCER CENTER ONLY)
HCT: 31.2 % — ABNORMAL LOW (ref 36.0–46.0)
Hemoglobin: 9.5 g/dL — ABNORMAL LOW (ref 12.0–15.0)

## 2023-10-15 MED ORDER — EPOETIN ALFA-EPBX 20000 UNIT/ML IJ SOLN
20000.0000 [IU] | Freq: Once | INTRAMUSCULAR | Status: AC
  Administered 2023-10-15: 20000 [IU] via SUBCUTANEOUS
  Filled 2023-10-15: qty 1

## 2023-11-11 ENCOUNTER — Other Ambulatory Visit: Payer: Self-pay | Admitting: *Deleted

## 2023-11-11 ENCOUNTER — Ambulatory Visit (INDEPENDENT_AMBULATORY_CARE_PROVIDER_SITE_OTHER): Payer: Medicare Other | Admitting: Physician Assistant

## 2023-11-11 VITALS — BP 163/70 | HR 74 | Ht 66.0 in | Wt 150.0 lb

## 2023-11-11 DIAGNOSIS — Z466 Encounter for fitting and adjustment of urinary device: Secondary | ICD-10-CM

## 2023-11-11 DIAGNOSIS — R339 Retention of urine, unspecified: Secondary | ICD-10-CM

## 2023-11-11 DIAGNOSIS — D649 Anemia, unspecified: Secondary | ICD-10-CM

## 2023-11-11 NOTE — Progress Notes (Signed)
 Cath Change/ Replacement  Patient is present today for a catheter change due to urinary retention.  8ml of water  was removed from the balloon, a 16FR Silastic foley cath was removed without difficulty.  Patient was cleaned and prepped in a sterile fashion with betadine. A 16 FR Silastic foley cath was replaced into the bladder, no complications were noted. Urine return was noted 10ml and urine was yellow in color. The balloon was filled with 10ml of sterile water . A leg bag was attached for drainage.  Patient tolerated well.    Performed by: Devansh Riese, PA-C   Follow up: Return in about 4 weeks (around 12/09/2023) for Catheter exchange.

## 2023-11-12 ENCOUNTER — Inpatient Hospital Stay (HOSPITAL_BASED_OUTPATIENT_CLINIC_OR_DEPARTMENT_OTHER): Payer: Medicare Other | Admitting: Nurse Practitioner

## 2023-11-12 ENCOUNTER — Other Ambulatory Visit: Payer: Self-pay

## 2023-11-12 ENCOUNTER — Other Ambulatory Visit: Payer: Self-pay | Admitting: Physician Assistant

## 2023-11-12 ENCOUNTER — Inpatient Hospital Stay: Payer: Medicare Other

## 2023-11-12 ENCOUNTER — Encounter: Payer: Self-pay | Admitting: Nurse Practitioner

## 2023-11-12 ENCOUNTER — Inpatient Hospital Stay: Payer: Medicare Other | Attending: Internal Medicine

## 2023-11-12 VITALS — BP 146/62 | HR 60 | Temp 98.0°F | Wt 153.0 lb

## 2023-11-12 DIAGNOSIS — E611 Iron deficiency: Secondary | ICD-10-CM | POA: Diagnosis not present

## 2023-11-12 DIAGNOSIS — J45909 Unspecified asthma, uncomplicated: Secondary | ICD-10-CM | POA: Diagnosis not present

## 2023-11-12 DIAGNOSIS — D631 Anemia in chronic kidney disease: Secondary | ICD-10-CM

## 2023-11-12 DIAGNOSIS — E785 Hyperlipidemia, unspecified: Secondary | ICD-10-CM | POA: Diagnosis not present

## 2023-11-12 DIAGNOSIS — R7989 Other specified abnormal findings of blood chemistry: Secondary | ICD-10-CM

## 2023-11-12 DIAGNOSIS — R5383 Other fatigue: Secondary | ICD-10-CM | POA: Insufficient documentation

## 2023-11-12 DIAGNOSIS — Z885 Allergy status to narcotic agent status: Secondary | ICD-10-CM | POA: Diagnosis not present

## 2023-11-12 DIAGNOSIS — N184 Chronic kidney disease, stage 4 (severe): Secondary | ICD-10-CM

## 2023-11-12 DIAGNOSIS — N1330 Unspecified hydronephrosis: Secondary | ICD-10-CM

## 2023-11-12 DIAGNOSIS — I129 Hypertensive chronic kidney disease with stage 1 through stage 4 chronic kidney disease, or unspecified chronic kidney disease: Secondary | ICD-10-CM | POA: Diagnosis not present

## 2023-11-12 DIAGNOSIS — M791 Myalgia, unspecified site: Secondary | ICD-10-CM | POA: Insufficient documentation

## 2023-11-12 DIAGNOSIS — N185 Chronic kidney disease, stage 5: Secondary | ICD-10-CM

## 2023-11-12 DIAGNOSIS — Z79899 Other long term (current) drug therapy: Secondary | ICD-10-CM | POA: Diagnosis not present

## 2023-11-12 DIAGNOSIS — N135 Crossing vessel and stricture of ureter without hydronephrosis: Secondary | ICD-10-CM

## 2023-11-12 DIAGNOSIS — D649 Anemia, unspecified: Secondary | ICD-10-CM

## 2023-11-12 DIAGNOSIS — E1122 Type 2 diabetes mellitus with diabetic chronic kidney disease: Secondary | ICD-10-CM | POA: Diagnosis not present

## 2023-11-12 DIAGNOSIS — Z886 Allergy status to analgesic agent status: Secondary | ICD-10-CM | POA: Diagnosis not present

## 2023-11-12 DIAGNOSIS — M549 Dorsalgia, unspecified: Secondary | ICD-10-CM | POA: Insufficient documentation

## 2023-11-12 DIAGNOSIS — M2550 Pain in unspecified joint: Secondary | ICD-10-CM | POA: Diagnosis not present

## 2023-11-12 DIAGNOSIS — R531 Weakness: Secondary | ICD-10-CM | POA: Diagnosis not present

## 2023-11-12 DIAGNOSIS — Z90710 Acquired absence of both cervix and uterus: Secondary | ICD-10-CM | POA: Diagnosis not present

## 2023-11-12 LAB — BASIC METABOLIC PANEL - CANCER CENTER ONLY
Anion gap: 9 (ref 5–15)
BUN: 69 mg/dL — ABNORMAL HIGH (ref 8–23)
CO2: 17 mmol/L — ABNORMAL LOW (ref 22–32)
Calcium: 8.4 mg/dL — ABNORMAL LOW (ref 8.9–10.3)
Chloride: 108 mmol/L (ref 98–111)
Creatinine: 3.25 mg/dL — ABNORMAL HIGH (ref 0.44–1.00)
GFR, Estimated: 14 mL/min — ABNORMAL LOW (ref >60)
Glucose, Bld: 151 mg/dL — ABNORMAL HIGH (ref 70–99)
Potassium: 4.3 mmol/L (ref 3.5–5.1)
Sodium: 134 mmol/L — ABNORMAL LOW (ref 135–145)

## 2023-11-12 LAB — CBC WITH DIFFERENTIAL/PLATELET
Abs Immature Granulocytes: 0.08 10*3/uL — ABNORMAL HIGH (ref 0.00–0.07)
Basophils Absolute: 0.1 10*3/uL (ref 0.0–0.1)
Basophils Relative: 1 %
Eosinophils Absolute: 0.1 10*3/uL (ref 0.0–0.5)
Eosinophils Relative: 1 %
HCT: 30.3 % — ABNORMAL LOW (ref 36.0–46.0)
Hemoglobin: 9.3 g/dL — ABNORMAL LOW (ref 12.0–15.0)
Immature Granulocytes: 1 %
Lymphocytes Relative: 11 %
Lymphs Abs: 1.4 10*3/uL (ref 0.7–4.0)
MCH: 28.4 pg (ref 26.0–34.0)
MCHC: 30.7 g/dL (ref 30.0–36.0)
MCV: 92.7 fL (ref 80.0–100.0)
Monocytes Absolute: 1.2 10*3/uL — ABNORMAL HIGH (ref 0.1–1.0)
Monocytes Relative: 10 %
Neutro Abs: 9.3 10*3/uL — ABNORMAL HIGH (ref 1.7–7.7)
Neutrophils Relative %: 76 %
Platelets: 316 10*3/uL (ref 150–400)
RBC: 3.27 MIL/uL — ABNORMAL LOW (ref 3.87–5.11)
RDW: 14.6 % (ref 11.5–15.5)
WBC: 12.1 10*3/uL — ABNORMAL HIGH (ref 4.0–10.5)
nRBC: 0 % (ref 0.0–0.2)

## 2023-11-12 MED ORDER — EPOETIN ALFA-EPBX 20000 UNIT/ML IJ SOLN
20000.0000 [IU] | Freq: Once | INTRAMUSCULAR | Status: AC
  Administered 2023-11-12: 20000 [IU] via SUBCUTANEOUS
  Filled 2023-11-12: qty 1

## 2023-11-12 NOTE — Progress Notes (Signed)
[facility] CONSULT NOTE  Patient Care Team: Danella Penton, MD as PCP - General (Internal Medicine) Katrinka Blazing Jethro Poling, NP (Inactive) as Nurse Practitioner (Hospice and Palliative Medicine) Earna Coder, MD as Consulting Physician (Oncology)  CHIEF COMPLAINTS/PURPOSE OF CONSULTATION: ANEMIA  HEMATOLOGY HISTORY  # ANEMIA- [Hb; MCV-platelets- WBC; Iron sat; ferritin;  GFR- CT/US; EGD/colonoscopy-[in [place]]- 2022 sep [Dr.Wohl]  # CKD, stage IV- [Dr. 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   HISTORY OF PRESENTING ILLNESS: Patient ambulating with wheelchair. Accompanied by husband  Stephanie SOKOLOSKI 83 y.o.  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 Stoioff] diagnosed with anemia of chronic kidney disease and decreased iron stores, currently on oral iron, who returns to clinic for follow up. She is tolerating oral iron well without GI upset or constipation. She feels at baseline. She has chronic weakness which is unchanged. She saw urology yesterday. Denies black or bloody stools.    Review of Systems  Constitutional:  Positive for malaise/fatigue. Negative for chills, diaphoresis, fever and weight loss.  HENT:  Negative for nosebleeds.   Respiratory:  Negative for cough, hemoptysis, sputum production, shortness of breath and wheezing.   Cardiovascular:  Negative for chest pain, palpitations, orthopnea and leg swelling.  Gastrointestinal:  Negative for abdominal pain, blood in stool, constipation, diarrhea, heartburn, melena, nausea and vomiting.  Genitourinary:  Negative for dysuria, flank pain, frequency, hematuria and urgency.       Denies vaginal bleeding  Musculoskeletal:  Positive for back pain, joint pain and myalgias. Negative for falls.  Skin: Negative.  Negative for itching and rash.  Neurological:  Negative for dizziness, tingling,  weakness and headaches.  Endo/Heme/Allergies:  Does not bruise/bleed easily.  Psychiatric/Behavioral:  Negative for depression. The patient is not nervous/anxious.     MEDICAL HISTORY:  Past Medical History:  Diagnosis Date   Anemia    Aortic atherosclerosis (HCC)    Arthritis    Asthma    Cancer (HCC) 1996   colon   Chronic kidney disease    Complication of anesthesia    nausea   Diabetes mellitus without complication (HCC)    Dysrhythmia    Foley catheter in place    GERD (gastroesophageal reflux disease)    Glaucoma    Hyperlipidemia    Hyperparathyroidism due to renal insufficiency (HCC)    Hypertension    Lumbar disc disease    Pneumonia    PONV (postoperative nausea and vomiting)    Rheumatoid arthritis involving multiple sites with positive rheumatoid factor (HCC)    Small bowel obstruction (HCC)    Stage 3b chronic kidney disease (CKD) (HCC)    Urinary retention    Uses walker    Vitamin B 12 deficiency    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    has, does not wear    SURGICAL HISTORY: Past Surgical History:  Procedure Laterality Date   ABDOMINAL HYSTERECTOMY     BACK SURGERY  2006   x5  "screws and rods"   BREAST BIOPSY Right 11/01/2021   rt br biopsy calcs ribbon clip/ benign 2nd site LIQ   BREAST BIOPSY Right 11/01/2021   rt br stereo calcs coil clip 1st site / benign UIQ   COLON SURGERY     COLONOSCOPY WITH PROPOFOL N/A 05/22/2017   Procedure: COLONOSCOPY WITH PROPOFOL;  Surgeon: Scot Jun, MD;  Location: [facility] ENDOSCOPY;  Service: Endoscopy;  Laterality: N/A;   COLONOSCOPY WITH PROPOFOL N/A 06/08/2021   Procedure: COLONOSCOPY WITH PROPOFOL;  Surgeon: Midge Minium, MD;  Location: [facility];  Service: Endoscopy;  Laterality: N/A;  Diabetic   CYSTOSCOPY W/ RETROGRADES Right 11/06/2022   Procedure: CYSTOSCOPY WITH RETROGRADE PYELOGRAM;  Surgeon: Riki Altes, MD;  Location: [facility] ORS;  Service: Urology;  Laterality: Right;   CYSTOSCOPY W/  URETERAL STENT PLACEMENT  07/25/2021   Procedure: CYSTOSCOPY WITH RETROGRADE PYELOGRAM/URETERAL STENT PLACEMENT;  Surgeon: Riki Altes, MD;  Location: [facility] ORS;  Service: Urology;;   CYSTOSCOPY W/ URETERAL STENT PLACEMENT  02/06/2022   Procedure: CYSTOSCOPY WITH RETROGRADE PYELOGRAM/URETERAL STENT EXCHANGE;  Surgeon: Riki Altes, MD;  Location: [facility] ORS;  Service: Urology;;   CYSTOSCOPY W/ URETERAL STENT PLACEMENT Right 11/06/2022   Procedure: CYSTOSCOPY WITH STENT EXCHANGE;  Surgeon: Riki Altes, MD;  Location: [facility] ORS;  Service: Urology;  Laterality: Right;   CYSTOSCOPY WITH STENT PLACEMENT Right 08/02/2020   Procedure: CYSTOSCOPY WITH STENT EXCHANGE;  Surgeon: Riki Altes, MD;  Location: [facility] ORS;  Service: Urology;  Laterality: Right;   CYSTOSCOPY WITH STENT PLACEMENT Right 06/18/2020   Procedure: CYSTOSCOPY WITH STENT PLACEMENT;  Surgeon: Bjorn Pippin, MD;  Location: [facility] ORS;  Service: Urology;  Laterality: Right;   EYE SURGERY Bilateral    Cataracts   FRACTURE SURGERY Right 2011   foot surgery   JOINT REPLACEMENT Bilateral    knees   KNEE ARTHROSCOPY Bilateral    LAPAROTOMY N/A 06/30/2020   Procedure: EXPLORATORY LAPAROTOMY;  Surgeon: Leafy Ro, MD;  Location: [facility] ORS;  Service: General;  Laterality: N/A;   LUMBAR LAMINECTOMY     POLYPECTOMY  06/08/2021   Procedure: POLYPECTOMY;  Surgeon: Midge Minium, MD;  Location: [facility];  Service: Endoscopy;;   TEMPORARY DIALYSIS CATHETER N/A 06/27/2020   Procedure: TEMPORARY DIALYSIS CATHETER;  Surgeon: Renford Dills, MD;  Location: [facility] INVASIVE CV LAB;  Service: Cardiovascular;  Laterality: N/A;    SOCIAL HISTORY: Social History   Socioeconomic History   Marital status: Married    Spouse name: [name]   Number of children: Not on file   Years of education: Not on file   Highest education level: Not on file  Occupational History   Not on file  Tobacco Use   Smoking status: Never   Smokeless  tobacco: Never  Vaping Use   Vaping status: Never Used  Substance and Sexual Activity   Alcohol use: No   Drug use: No   Sexual activity: Yes  Other Topics Concern   Not on file  Social History Narrative   Not on file   Social Drivers of Health   Financial Resource Strain: Low Risk  (09/05/2023)   Received from [facility]   Overall Financial Resource Strain (CARDIA)    Difficulty of Paying Living Expenses: Not hard at all  Food Insecurity: No Food Insecurity (09/05/2023)   Received from [facility]   Hunger Vital Sign    Worried About Running Out of Food in the Last Year: Never true    Ran Out of Food in the Last Year: Never true  Transportation Needs: No Transportation Needs (09/05/2023)   Received from [facility] - Transportation    In the past 12 months, has lack of transportation kept you from medical appointments or from getting medications?: No    Lack of Transportation (Non-Medical): No  Physical Activity: Not on file  Stress: Not on file  Social Connections: Not on file  Intimate Partner Violence: Not At Risk (03/27/2023)   Humiliation, Afraid, Rape, and Kick questionnaire    Fear of Current or Ex-Partner: No    Emotionally Abused: No    Physically Abused: No    Sexually Abused: No    FAMILY HISTORY: Family History  Problem Relation Age of Onset   Breast cancer Neg Hx     ALLERGIES:  is allergic to infliximab, ibuprofen, indomethacin, methotrexate derivatives, moexipril, percocet [oxycodone-acetaminophen], and naprosyn [naproxen].   MEDICATIONS:  Current Outpatient Medications  Medication Sig Dispense Refill   acetaminophen (TYLENOL) 325 MG tablet Take 2 tablets (650 mg total) by mouth every 6 (six) hours as needed for moderate pain, fever or headache.     albuterol (VENTOLIN HFA) 108 (90 Base) MCG/ACT inhaler Inhale into the lungs every 6 (six) hours as needed for wheezing or shortness of  breath.     ascorbic acid (VITAMIN C) 500 MG tablet Take 1 tablet (500 mg total) by mouth daily.     brimonidine (ALPHAGAN) 0.2 % ophthalmic solution Place 1 drop into both eyes 2 (two) times daily.     CONTOUR TEST test strip 1 each by Other route daily.   5   Cranberry 400 MG CAPS Take 400 mg by mouth daily.      diltiazem (CARDIZEM CD) 240 MG 24 hr capsule Take 240 mg by mouth daily.     fluticasone-salmeterol (ADVAIR) 100-50 MCG/ACT AEPB Inhale 1 puff into the lungs 2 (two) times daily.     glipiZIDE (GLUCOTROL) 10 MG tablet Take 5 mg by mouth daily.     hydrALAZINE (APRESOLINE) 25 MG tablet Take 1 tablet by mouth 2 (two) times daily.     leflunomide (ARAVA) 10 MG tablet Take 10 mg by mouth every other day.     Magnesium Oxide 250 MG TABS Take 250 mg by mouth 2 (two) times daily.     metoprolol tartrate (LOPRESSOR) 50 MG tablet Take 50 mg by mouth 2 (two) times daily.     omeprazole (PRILOSEC) 40 MG capsule Take 40 mg by mouth daily.     Probiotic Product (PROBIOTIC-10 PO) Take 1 capsule by mouth daily.     rosuvastatin (CRESTOR) 10 MG tablet Take 10 mg by mouth daily.      torsemide (DEMADEX) 10 MG tablet Take 1 tablet by mouth daily.     triamcinolone (KENALOG) 0.025 % ointment Apply 1 application. topically 2 (two) times daily. (Patient taking differently: Apply 1 application  topically 2 (two) times daily as needed (irritation).) 30 g 0   vitamin B-12 (CYANOCOBALAMIN) 1000 MCG tablet Take 1,000 mcg by mouth daily.     Iron-Vitamin C (VITRON-C) 65-125 MG TABS Take 1 tablet by mouth daily. (Patient not taking: Reported on 11/12/2023)     No current facility-administered medications for this visit.     PHYSICAL EXAMINATION: Vitals:   11/12/23 0912  BP: (!) 146/62  Pulse: 60  Temp: 98 F (36.7 C)  SpO2: 98%   Filed Weights   11/12/23 0912  Weight: 153 lb (69.4 kg)   Physical Exam Vitals reviewed.  Constitutional:      Appearance: She is not ill-appearing.     Comments:  Chronically ill appearing. Frail. In wheelchair. Accompanied by husband.   HENT:     Head: Normocephalic and atraumatic.  Cardiovascular:     Rate and Rhythm: Normal rate and regular rhythm.  Pulmonary:  Effort: No respiratory distress.     Comments: Decreased breath sounds bilaterally.  Abdominal:     General: There is no distension.  Skin:    General: Skin is warm and dry.     Coloration: Skin is not pale.  Neurological:     Mental Status: She is alert and oriented to person, place, and time.  Psychiatric:        Mood and Affect: Mood normal.        Behavior: Behavior normal.      LABORATORY DATA:  I have reviewed the data as listed Lab Results  Component Value Date   WBC 12.1 (H) 11/12/2023   HGB 9.3 (L) 11/12/2023   HCT 30.3 (L) 11/12/2023   MCV 92.7 11/12/2023   PLT 316 11/12/2023   Recent Labs    03/27/23 1222 05/28/23 1002 08/20/23 0925 09/17/23 1003 11/12/23 0853  NA 135   < > 134* 137 134*  K 4.7   < > 4.8 4.9 4.3  CL 104   < > 105 111 108  CO2 23   < > 19* 20* 17*  GLUCOSE 146*   < > 329* 238* 151*  BUN 46*   < > 43* 46* 69*  CREATININE 2.19*   < > 2.42* 2.42* 3.25*  CALCIUM 9.1   < > 8.8* 8.6* 8.4*  GFRNONAA 22*   < > 20* 20* 14*  PROT 7.0  --   --   --   --   ALBUMIN 3.7  --   --   --   --   AST 19  --   --   --   --   ALT 14  --   --   --   --   ALKPHOS 118  --   --   --   --   BILITOT 0.1*  --   --   --   --    < > = values in this interval not displayed.   Iron/TIBC/Ferritin/ %Sat    Component Value Date/Time   IRON 66 09/17/2023 1003   TIBC 256 09/17/2023 1003   FERRITIN 212 09/17/2023 1003   IRONPCTSAT 26 09/17/2023 1003   No results found.  ASSESSMENT & PLAN:   # Chronic anemia- normocytic JUNE 2024- Hb 9; Normal WBC/platelets- Worsening. Symptomatic. Workup with Dr Donneta Romberg most consistent with anemia secondary to her chronic renal disease. Today we again reviewed the pathophysiology of anemia from chronic kidney disease and  impact on erythropoietin production and decreased iron stores. Also reviewed that worsening kidney disease often results in worsening anemia without intervention. She was reluctant to start IV iron and startedoral iron. Hemoglobin worsened to 8.8/baseline 9. Started retacrit 20,000 units with goal of maintaining hemoglobin > 10, minimizing transfusions. Tolerating well. Hemoglobin 9.3 today. Hold venofer. Proceed with retacrit today.   # Iron Deficiency- Reviewed plan to optimize iron stores in setting of CKD. On oral iron biglycinate daily. Continue oral iron. Hold venofer.   # CKD stage IV [Dr.Kolluru/ Dr.Stoioff]- chronic UPJ obstruction managed with an indwelling ureteral stent.  Underwent exchange yesterday. GFR baseline 20, today 14. Worse. Spoke with urology. They will follow up with her later this week.   # Hyperglycemia- glucose 151 today. Encouraged dietary restriction of carbohydrates    DISPOSITION: Retacrit today; NO venofer  F/u as scheduled- la  No problem-specific Assessment & Plan notes found for this encounter.  All questions were answered. The patient knows to call the clinic  with any problems, questions or concerns.  Alinda Dooms, NP 11/12/2023

## 2023-11-12 NOTE — Progress Notes (Unsigned)
   [facility] Surgical Posting Form  Surgery Date: Date: 12/03/2023  Surgeon: Dr. Irineo Axon, MD  Inpt ( No  )   Outpt (Yes)   Obs ( No  )   Diagnosis: N13.30 Right Hydronephrosis  -CPT: 78295  Surgery: Right Cystoscopy with Ureteral Stent Exchange  Stop Anticoagulations: N/A  Cardiac/Medical/Pulmonary Clearance needed: no  *Orders entered into EPIC  Date: 11/12/23   *Case booked in EPIC  Date: 11/12/23  *Notified pt of Surgery: Date: 11/12/23  PRE-OP UA & CX: yes, will obtain in clinic on 11/18/2023  *Placed into Prior Authorization Work Angela Nevin Date: 11/12/23  Assistant/laser/rep:No

## 2023-11-12 NOTE — Progress Notes (Signed)
I just received a message from Consuello Masse, NP, regarding Stephanie [name]'s worsening renal function. AM labs today show creatinine 3.25, baseline 2.42. She is due for exchange of her chronic right ureteral stent. Will obtain KUB to assess for any encrustation and I'm placing OR orders for her right ureteral stent change with Dr. Lonna Cobb.

## 2023-11-12 NOTE — Progress Notes (Unsigned)
Surgical Physician Order Form [facility]  Dr. Lonna Cobb * Scheduling expectation : ASAP  *Length of Case:   *Clearance needed: no  *Anticoagulation Instructions: N/A  *Aspirin Instructions: N/A  *Post-op visit Date/Instructions:  1 month with RUS prior, cath change, BMP  *Diagnosis: Right Hydronephrosis  *Procedure: right  Cysto w/stent exchange (16109)   Additional orders: N/A  -Admit type: OUTpatient  -Anesthesia: General  -VTE Prophylaxis Standing Order SCD's       Other:   -Standing Lab Orders Per Anesthesia    Lab other: UA&Urine Culture  -Standing Test orders EKG/Chest x-ray per Anesthesia       Test other:   - Medications:  Cipro 400mg  IV  -Other orders:  N/A

## 2023-11-14 ENCOUNTER — Ambulatory Visit
Admission: RE | Admit: 2023-11-14 | Discharge: 2023-11-14 | Disposition: A | Discharge status: Home / Self Care | Payer: Medicare Other | Source: Ambulatory Visit | Attending: Physician Assistant | Admitting: Physician Assistant

## 2023-11-14 ENCOUNTER — Telehealth: Payer: Self-pay

## 2023-11-14 DIAGNOSIS — N185 Chronic kidney disease, stage 5: Secondary | ICD-10-CM | POA: Insufficient documentation

## 2023-11-14 DIAGNOSIS — N135 Crossing vessel and stricture of ureter without hydronephrosis: Secondary | ICD-10-CM

## 2023-11-14 NOTE — Telephone Encounter (Signed)
  Per Dr. Lonna Cobb, Patient is to be scheduled for  Right Cystoscopy with Ureteral Stent Exchange   Stephanie [name] was contacted and possible surgical dates were discussed, Tuesday March 4th, 2025 was agreed upon for surgery.   Patient was instructed that Dr. Lonna Cobb will require them to provide a pre-op UA & CX prior to surgery. This was ordered and scheduled drop off appointment was made for 11/18/2023.    Patient was directed to call [phone] between 1-3pm the day before surgery to find out surgical arrival time.  Instructions were given not to eat or drink from midnight on the night before surgery and have a driver for the day of surgery. On the surgery day patient was instructed to enter through the Medical Mall entrance of [facility] report the Same Day Surgery desk.   Pre-Admit Testing will be in contact via phone to set up an interview with the anesthesia team to review your history and medications prior to surgery.   Reminder of this information was sent via MyChart to the patient.

## 2023-11-18 ENCOUNTER — Ambulatory Visit (INDEPENDENT_AMBULATORY_CARE_PROVIDER_SITE_OTHER): Payer: Medicare Other | Admitting: Physician Assistant

## 2023-11-18 DIAGNOSIS — Z01818 Encounter for other preprocedural examination: Secondary | ICD-10-CM

## 2023-11-18 DIAGNOSIS — N390 Urinary tract infection, site not specified: Secondary | ICD-10-CM

## 2023-11-18 LAB — URINALYSIS, COMPLETE
Bilirubin, UA: NEGATIVE
Glucose, UA: NEGATIVE
Ketones, UA: NEGATIVE
Nitrite, UA: NEGATIVE
Specific Gravity, UA: 1.025 (ref 1.005–1.030)
Urobilinogen, Ur: 0.2 mg/dL (ref 0.2–1.0)
pH, UA: 5 (ref 5.0–7.5)

## 2023-11-18 LAB — MICROSCOPIC EXAMINATION: WBC, UA: >30 /[HPF] — AB (ref 0–5)

## 2023-11-18 NOTE — Progress Notes (Signed)
Patient presents to the office today for collection of Pre op urine and culture. Patients catheter was plugged for 15 -20 minutes and a UA and Culture sample was collected at that time. New cath leg bag was attached.

## 2023-11-21 ENCOUNTER — Other Ambulatory Visit: Payer: Self-pay

## 2023-11-21 DIAGNOSIS — N390 Urinary tract infection, site not specified: Secondary | ICD-10-CM

## 2023-11-21 LAB — CULTURE, URINE COMPREHENSIVE

## 2023-11-21 MED ORDER — CEPHALEXIN 500 MG PO CAPS: 500.0000 mg | ORAL_CAPSULE | Freq: Every day | ORAL | 0 refills | Status: DC

## 2023-11-21 MED ORDER — CEPHALEXIN 500 MG PO CAPS: 500.0000 mg | ORAL_CAPSULE | Freq: Every day | ORAL | 0 refills | Status: AC

## 2023-11-21 NOTE — Addendum Note (Signed)
Addended byRanda Lynn on: 11/21/2023 03:06 PM   Modules accepted: Orders

## 2023-11-25 ENCOUNTER — Encounter
Admission: RE | Admit: 2023-11-25 | Discharge: 2023-11-25 | Disposition: A | Discharge status: Home / Self Care | Payer: Medicare Other | Source: Ambulatory Visit | Attending: Urology | Admitting: Urology

## 2023-11-25 ENCOUNTER — Other Ambulatory Visit: Payer: Self-pay

## 2023-11-25 DIAGNOSIS — D631 Anemia in chronic kidney disease: Secondary | ICD-10-CM

## 2023-11-25 DIAGNOSIS — E119 Type 2 diabetes mellitus without complications: Secondary | ICD-10-CM

## 2023-11-25 HISTORY — DX: Dyspnea, unspecified: R06.00

## 2023-11-25 HISTORY — DX: Unspecified hydronephrosis: N13.30

## 2023-11-25 MED ORDER — CEPHALEXIN 500 MG PO CAPS
500.0000 mg | ORAL_CAPSULE | Freq: Every day | ORAL | 0 refills | Status: AC
Start: 2023-11-25 — End: 2023-12-02

## 2023-11-25 NOTE — Progress Notes (Signed)
 Antibiotic sent, confirmed with pharmacy. States it will be ready by 11:30am today. Pt's husband informed. Voiced understanding.

## 2023-11-25 NOTE — Patient Instructions (Addendum)
 Your procedure is scheduled on: 12/03/23 - Tuesday Report to the Registration Desk on the 1st floor of the Medical Mall. To find out your arrival time, please call [phone] between 1PM - 3PM on: 12/02/23 - Monday If your arrival time is 6:00 am, do not arrive before that time as the Medical Mall entrance doors do not open until 6:00 am.  REMEMBER: Instructions that are not followed completely may result in serious medical risk, up to and including death; or upon the discretion of your surgeon and anesthesiologist your surgery may need to be rescheduled.  Do not eat food or drink any liquids after midnight the night before surgery.  No gum chewing or hard candies.   One week prior to surgery: Stop Anti-inflammatories (NSAIDS) such as Advil, Aleve, Ibuprofen, Motrin, Naproxen, Naprosyn and Aspirin based products such as Excedrin, Goody's Powder, BC Powder. You may take Tylenol if needed for pain up until the day of surgery.  Stop ANY OVER THE COUNTER supplements until after surgery : VITAMIN C ,Cranberry ,Iron-Vitamin C (VITRON-C) ,Magnesium Oxide .   ON THE DAY OF SURGERY ONLY TAKE THESE MEDICATIONS WITH SIPS OF WATER:  brimonidine (ALPHAGAN)  diltiazem (CARDIZEM CD)  hydrALAZINE (APRESOLINE)  ADVAIR  leflunomide (ARAVA)  metoprolol tartrate (LOPRESSOR)  omeprazole (PRILOSEC)  cephALEXin (KEFLEX)   Use albuterol (VENTOLIN HFA)  on the day of surgery and bring to the hospital.   No Alcohol for 24 hours before or after surgery.  No Smoking including e-cigarettes for 24 hours before surgery.  No chewable tobacco products for at least 6 hours before surgery.  No nicotine patches on the day of surgery.  Do not use any "recreational" drugs for at least a week (preferably 2 weeks) before your surgery.  Please be advised that the combination of cocaine and anesthesia may have negative outcomes, up to and including death. If you test positive for cocaine, your surgery will be  cancelled.  On the morning of surgery brush your teeth with toothpaste and water, you may rinse your mouth with mouthwash if you wish. Do not swallow any toothpaste or mouthwash.  Do not wear jewelry, make-up, hairpins, clips or nail polish.  For welded (permanent) jewelry: bracelets, anklets, waist bands, etc.  Please have this removed prior to surgery.  If it is not removed, there is a chance that hospital personnel will need to cut it off on the day of surgery.  Do not wear lotions, powders, or perfumes.   Do not shave body hair from the neck down 48 hours before surgery.  Contact lenses, hearing aids and dentures may not be worn into surgery.  Do not bring valuables to the hospital. [facility] is not responsible for any missing/lost belongings or valuables.   Notify your doctor if there is any change in your medical condition (cold, fever, infection).  Wear comfortable clothing (specific to your surgery type) to the hospital.  After surgery, you can help prevent lung complications by doing breathing exercises.  Take deep breaths and cough every 1-2 hours. Your doctor may order a device called an Incentive Spirometer to help you take deep breaths. When coughing or sneezing, hold a pillow firmly against your incision with both hands. This is called "splinting." Doing this helps protect your incision. It also decreases belly discomfort.  If you are being admitted to the hospital overnight, leave your suitcase in the car. After surgery it may be brought to your room.  In case of increased patient census, it  may be necessary for you, the patient, to continue your postoperative care in the Same Day Surgery department.  If you are being discharged the day of surgery, you will not be allowed to drive home. You will need a responsible individual to drive you home and stay with you for 24 hours after surgery.   If you are taking public transportation, you will need to have a responsible  individual with you.  Please call the Pre-admissions Testing Dept. at [phone] if you have any questions about these instructions.  Surgery Visitation Policy:  Patients having surgery or a procedure may have two visitors.  Children under the age of 13 must have an adult with them who is not the patient.  Temporary Visitor Restrictions Due to increasing cases of flu, RSV and COVID-19: Children ages 47 and under will not be able to visit patients in [facility] hospitals under most circumstances.  Inpatient Visitation:    Visiting hours are 7 a.m. to 8 p.m. Up to four visitors are allowed at one time in a patient room. The visitors may rotate out with other people during the day.  One visitor age 55 or older may stay with the patient overnight and must be in the room by 8 p.m.

## 2023-11-27 ENCOUNTER — Encounter
Admission: RE | Admit: 2023-11-27 | Discharge: 2023-11-27 | Disposition: A | Discharge status: Home / Self Care | Payer: Medicare Other | Source: Ambulatory Visit | Attending: Urology | Admitting: Urology

## 2023-11-27 DIAGNOSIS — E119 Type 2 diabetes mellitus without complications: Secondary | ICD-10-CM

## 2023-11-27 DIAGNOSIS — E875 Hyperkalemia: Secondary | ICD-10-CM | POA: Diagnosis not present

## 2023-11-27 DIAGNOSIS — N179 Acute kidney failure, unspecified: Secondary | ICD-10-CM

## 2023-11-27 DIAGNOSIS — Z466 Encounter for fitting and adjustment of urinary device: Secondary | ICD-10-CM | POA: Diagnosis not present

## 2023-11-27 DIAGNOSIS — I452 Bifascicular block: Secondary | ICD-10-CM | POA: Diagnosis not present

## 2023-11-27 DIAGNOSIS — Z01818 Encounter for other preprocedural examination: Secondary | ICD-10-CM | POA: Insufficient documentation

## 2023-11-27 DIAGNOSIS — N184 Chronic kidney disease, stage 4 (severe): Secondary | ICD-10-CM | POA: Insufficient documentation

## 2023-11-27 DIAGNOSIS — Z01812 Encounter for preprocedural laboratory examination: Secondary | ICD-10-CM | POA: Diagnosis present

## 2023-11-27 DIAGNOSIS — E1122 Type 2 diabetes mellitus with diabetic chronic kidney disease: Secondary | ICD-10-CM | POA: Diagnosis not present

## 2023-11-27 DIAGNOSIS — D631 Anemia in chronic kidney disease: Secondary | ICD-10-CM | POA: Diagnosis not present

## 2023-11-27 DIAGNOSIS — Z01810 Encounter for preprocedural cardiovascular examination: Secondary | ICD-10-CM | POA: Diagnosis present

## 2023-11-27 HISTORY — DX: Unspecified right bundle-branch block: I45.10

## 2023-11-27 HISTORY — DX: Chronic kidney disease, stage 4 (severe): N18.4

## 2023-11-27 LAB — BASIC METABOLIC PANEL
Anion gap: 7 (ref 5–15)
BUN: 87 mg/dL — ABNORMAL HIGH (ref 8–23)
CO2: 20 mmol/L — ABNORMAL LOW (ref 22–32)
Calcium: 8.9 mg/dL (ref 8.9–10.3)
Chloride: 105 mmol/L (ref 98–111)
Creatinine, Ser: 2.91 mg/dL — ABNORMAL HIGH (ref 0.44–1.00)
GFR, Estimated: 16 mL/min — ABNORMAL LOW (ref >60)
Glucose, Bld: 346 mg/dL — ABNORMAL HIGH (ref 70–99)
Potassium: 5.7 mmol/L — ABNORMAL HIGH (ref 3.5–5.1)
Sodium: 132 mmol/L — ABNORMAL LOW (ref 135–145)

## 2023-11-27 LAB — CBC
HCT: 32.8 % — ABNORMAL LOW (ref 36.0–46.0)
Hemoglobin: 10.1 g/dL — ABNORMAL LOW (ref 12.0–15.0)
MCH: 28.2 pg (ref 26.0–34.0)
MCHC: 30.8 g/dL (ref 30.0–36.0)
MCV: 91.6 fL (ref 80.0–100.0)
Platelets: 223 10*3/uL (ref 150–400)
RBC: 3.58 MIL/uL — ABNORMAL LOW (ref 3.87–5.11)
RDW: 15.4 % (ref 11.5–15.5)
WBC: 10.3 10*3/uL (ref 4.0–10.5)
nRBC: 0 % (ref 0.0–0.2)

## 2023-11-27 NOTE — Progress Notes (Addendum)
  [facility] Perioperative Services: Pre-Admission/Anesthesia Testing  Abnormal Lab Notification   Date: 11/27/23  Name: Stephanie [name] MRN:   [id number]  Re: Abnormal labs noted during PAT appointment   Notified:    Provider Name Provider Role Notification Mode  Riki Altes, MD Urology (Surgeon) Routed and/or faxed via [facility]    ABNORMAL LAB VALUE(S):   Lab Results  Component Value Date   K 5.7 (H) 11/27/2023   Clinical Information and Notes:  AUDA FINFROCK is scheduled for a CYSTOSCOPY WITH STENT EXCHANGE (Right) on 12/03/2023.   Renal function has improved function has improved from last check 2 weeks ago.  BUN 87 and creatinine 2.91 mg/dL; previously BUN 69 and creatinine 3.25 mg/dL. K+ level 2 weeks ago was WNL at 4.3 mmol/L.   She has been hyperkalemic on a couple of instances when labs have been checked over the course of the past 1 year. The maximum level was 5.4 mmol/L in 08/2022 when patient was admitted for hypoglycemia, AMS, influenza, and klebsiella bacteruria (chronic indwelling foley in place). Admitting K+ was 2.7 mmol/L, which was treated with 40 mEq of KCl in the ED, followed by another 40 mEq at time of admission. Patient subsequently received dose of Lokelma, appropriately reducing K+ level to 4.4 mmol/L prior to discharge.   Reviewed patient's current medication list to determine whether or not this could be potentially medication mediated.  Review revealed medications that would typically cause patient's K+ level to be lower (albuterol, torsemide). With that said, patient is on diltiazem CD 240 mg daily, which can affect the movement of potassium ions in the body, typically resulting in hypokalemia. In some cases, diltiazem can inhibit the activity of potassium channels, leading to an increase in serum potassium levels. This, in addition to the decreased renal function (AKI superimposed on CKD-III) are both likely implicated in the elevated K+  level that we are seeing on patient's preoperative labs.   Sending result to primary attending surgeon to make him aware of the current level. Order has been entered to have K+ rechecked on the day of her procedure to ensure that she is safe to proceed with anesthesia.  Quentin Mulling, MSN, APRN, FNP-C, CEN [facility]  Perioperative Services Nurse Practitioner Phone: [phone] Fax: [phone] 11/27/23 1:14 PM

## 2023-12-03 ENCOUNTER — Encounter: Payer: Self-pay | Admitting: Urology

## 2023-12-03 ENCOUNTER — Ambulatory Visit
Admission: RE | Admit: 2023-12-03 | Discharge: 2023-12-03 | Disposition: A | Discharge status: Home / Self Care | Payer: Medicare Other | Source: Ambulatory Visit | Attending: Urology | Admitting: Urology

## 2023-12-03 ENCOUNTER — Ambulatory Visit

## 2023-12-03 ENCOUNTER — Encounter
Admission: RE | Disposition: A | Discharge status: Home / Self Care | Payer: Self-pay | Source: Ambulatory Visit | Attending: Urology

## 2023-12-03 ENCOUNTER — Ambulatory Visit: Payer: Self-pay | Admitting: Urgent Care

## 2023-12-03 ENCOUNTER — Other Ambulatory Visit: Payer: Self-pay

## 2023-12-03 DIAGNOSIS — Z7984 Long term (current) use of oral hypoglycemic drugs: Secondary | ICD-10-CM | POA: Insufficient documentation

## 2023-12-03 DIAGNOSIS — N131 Hydronephrosis with ureteral stricture, not elsewhere classified: Secondary | ICD-10-CM | POA: Diagnosis not present

## 2023-12-03 DIAGNOSIS — K219 Gastro-esophageal reflux disease without esophagitis: Secondary | ICD-10-CM | POA: Insufficient documentation

## 2023-12-03 DIAGNOSIS — D631 Anemia in chronic kidney disease: Secondary | ICD-10-CM | POA: Diagnosis not present

## 2023-12-03 DIAGNOSIS — Z466 Encounter for fitting and adjustment of urinary device: Secondary | ICD-10-CM | POA: Diagnosis present

## 2023-12-03 DIAGNOSIS — E1165 Type 2 diabetes mellitus with hyperglycemia: Secondary | ICD-10-CM | POA: Insufficient documentation

## 2023-12-03 DIAGNOSIS — E875 Hyperkalemia: Secondary | ICD-10-CM

## 2023-12-03 DIAGNOSIS — R339 Retention of urine, unspecified: Secondary | ICD-10-CM | POA: Diagnosis not present

## 2023-12-03 DIAGNOSIS — N184 Chronic kidney disease, stage 4 (severe): Secondary | ICD-10-CM | POA: Diagnosis not present

## 2023-12-03 DIAGNOSIS — N1330 Unspecified hydronephrosis: Secondary | ICD-10-CM

## 2023-12-03 DIAGNOSIS — Z01812 Encounter for preprocedural laboratory examination: Secondary | ICD-10-CM

## 2023-12-03 DIAGNOSIS — J45909 Unspecified asthma, uncomplicated: Secondary | ICD-10-CM | POA: Diagnosis not present

## 2023-12-03 DIAGNOSIS — I129 Hypertensive chronic kidney disease with stage 1 through stage 4 chronic kidney disease, or unspecified chronic kidney disease: Secondary | ICD-10-CM | POA: Insufficient documentation

## 2023-12-03 DIAGNOSIS — N1832 Chronic kidney disease, stage 3b: Secondary | ICD-10-CM

## 2023-12-03 DIAGNOSIS — E1122 Type 2 diabetes mellitus with diabetic chronic kidney disease: Secondary | ICD-10-CM | POA: Diagnosis not present

## 2023-12-03 DIAGNOSIS — Z79899 Other long term (current) drug therapy: Secondary | ICD-10-CM | POA: Insufficient documentation

## 2023-12-03 DIAGNOSIS — E119 Type 2 diabetes mellitus without complications: Secondary | ICD-10-CM

## 2023-12-03 HISTORY — PX: CYSTOSCOPY W/ URETERAL STENT PLACEMENT: SHX1429

## 2023-12-03 LAB — POCT I-STAT, CHEM 8
BUN: 62 mg/dL — ABNORMAL HIGH (ref 8–23)
Calcium, Ion: 1.09 mmol/L — ABNORMAL LOW (ref 1.15–1.40)
Chloride: 107 mmol/L (ref 98–111)
Creatinine, Ser: 2.8 mg/dL — ABNORMAL HIGH (ref 0.44–1.00)
Glucose, Bld: 250 mg/dL — ABNORMAL HIGH (ref 70–99)
HCT: 30 % — ABNORMAL LOW (ref 36.0–46.0)
Hemoglobin: 10.2 g/dL — ABNORMAL LOW (ref 12.0–15.0)
Potassium: 4.6 mmol/L (ref 3.5–5.1)
Sodium: 135 mmol/L (ref 135–145)
TCO2: 17 mmol/L — ABNORMAL LOW (ref 22–32)

## 2023-12-03 LAB — GLUCOSE, CAPILLARY: Glucose-Capillary: 257 mg/dL — ABNORMAL HIGH (ref 70–99)

## 2023-12-03 SURGERY — CYSTOSCOPY, FLEXIBLE, WITH STENT REPLACEMENT
Anesthesia: Monitor Anesthesia Care | Site: Ureter | Laterality: Right

## 2023-12-03 MED ORDER — FENTANYL CITRATE (PF) 100 MCG/2ML IJ SOLN: 25.0000 ug | INTRAMUSCULAR | Status: DC | PRN

## 2023-12-03 MED ORDER — INSULIN ASPART 100 UNIT/ML IJ SOLN
INTRAMUSCULAR | Status: AC
  Filled 2023-12-03: qty 1

## 2023-12-03 MED ORDER — FENTANYL CITRATE (PF) 100 MCG/2ML IJ SOLN
INTRAMUSCULAR | Status: DC | PRN
  Administered 2023-12-03: 25 ug via INTRAVENOUS

## 2023-12-03 MED ORDER — STERILE WATER FOR IRRIGATION IR SOLN
Status: DC | PRN
  Administered 2023-12-03: 500 mL

## 2023-12-03 MED ORDER — CHLORHEXIDINE GLUCONATE 0.12 % MT SOLN: 15.0000 mL | Freq: Once | OROMUCOSAL | Status: AC

## 2023-12-03 MED ORDER — OXYCODONE HCL 5 MG/5ML PO SOLN: 5.0000 mg | Freq: Once | ORAL | Status: DC | PRN

## 2023-12-03 MED ORDER — SODIUM CHLORIDE 0.9% FLUSH: 3.0000 mL | INTRAVENOUS | Status: DC | PRN

## 2023-12-03 MED ORDER — CHLORHEXIDINE GLUCONATE 0.12 % MT SOLN
OROMUCOSAL | Status: AC
  Filled 2023-12-03: qty 15

## 2023-12-03 MED ORDER — INSULIN ASPART 100 UNIT/ML IJ SOLN
5.0000 [IU] | Freq: Once | INTRAMUSCULAR | Status: AC
  Administered 2023-12-03: 5 [IU] via SUBCUTANEOUS

## 2023-12-03 MED ORDER — SODIUM CHLORIDE 0.9% FLUSH: 3.0000 mL | Freq: Two times a day (BID) | INTRAVENOUS | Status: DC

## 2023-12-03 MED ORDER — PROPOFOL 10 MG/ML IV BOLUS
INTRAVENOUS | Status: AC
  Filled 2023-12-03: qty 20

## 2023-12-03 MED ORDER — SODIUM CHLORIDE 0.9 % IV SOLN: INTRAVENOUS | Status: DC | PRN

## 2023-12-03 MED ORDER — PROPOFOL 500 MG/50ML IV EMUL
INTRAVENOUS | Status: DC | PRN
  Administered 2023-12-03: 75 ug/kg/min via INTRAVENOUS

## 2023-12-03 MED ORDER — CIPROFLOXACIN IN D5W 400 MG/200ML IV SOLN
400.0000 mg | INTRAVENOUS | Status: AC
  Administered 2023-12-03: 400 mg via INTRAVENOUS

## 2023-12-03 MED ORDER — ORAL CARE MOUTH RINSE
15.0000 mL | Freq: Once | OROMUCOSAL | Status: AC
  Administered 2023-12-03: 15 mL via OROMUCOSAL

## 2023-12-03 MED ORDER — PROPOFOL 10 MG/ML IV BOLUS
INTRAVENOUS | Status: DC | PRN
  Administered 2023-12-03: 20 mg via INTRAVENOUS

## 2023-12-03 MED ORDER — FENTANYL CITRATE (PF) 100 MCG/2ML IJ SOLN
INTRAMUSCULAR | Status: AC
  Filled 2023-12-03: qty 2

## 2023-12-03 MED ORDER — DROPERIDOL 2.5 MG/ML IJ SOLN: 0.6250 mg | Freq: Once | INTRAMUSCULAR | Status: DC | PRN

## 2023-12-03 MED ORDER — ACETAMINOPHEN 10 MG/ML IV SOLN: 1000.0000 mg | Freq: Once | INTRAVENOUS | Status: DC | PRN

## 2023-12-03 MED ORDER — CIPROFLOXACIN IN D5W 400 MG/200ML IV SOLN
INTRAVENOUS | Status: AC
  Filled 2023-12-03: qty 200

## 2023-12-03 MED ORDER — OXYCODONE HCL 5 MG PO TABS: 5.0000 mg | ORAL_TABLET | Freq: Once | ORAL | Status: DC | PRN

## 2023-12-03 MED ORDER — PROPOFOL 10 MG/ML IV BOLUS
INTRAVENOUS | Status: AC
Start: 2023-12-03 — End: ?
  Filled 2023-12-03: qty 20

## 2023-12-03 MED ORDER — SODIUM CHLORIDE 0.9 % IR SOLN
Status: DC | PRN
  Administered 2023-12-03: 3000 mL

## 2023-12-03 SURGICAL SUPPLY — 17 items
BRUSH SCRUB EZ 4% CHG (MISCELLANEOUS) ×1 IMPLANT
CATH FOL 2WAY LX 18X30 (CATHETERS) IMPLANT
CATH URETL OPEN END 6X70 (CATHETERS) ×1 IMPLANT
GLOVE BIOGEL PI IND STRL 7.5 (GLOVE) ×1 IMPLANT
GOWN STRL REUS W/ TWL XL LVL3 (GOWN DISPOSABLE) ×1 IMPLANT
GUIDEWIRE STR DUAL SENSOR (WIRE) ×1 IMPLANT
HOLDER FOLEY CATH W/STRAP (MISCELLANEOUS) IMPLANT
IV NS IRRIG 3000ML ARTHROMATIC (IV SOLUTION) ×1 IMPLANT
KIT TURNOVER CYSTO (KITS) ×1 IMPLANT
PACK CYSTO AR (MISCELLANEOUS) ×1 IMPLANT
SET CYSTO W/LG BORE CLAMP LF (SET/KITS/TRAYS/PACK) ×1 IMPLANT
STENT URET 6FRX24 CONTOUR (STENTS) IMPLANT
STENT URET 6FRX26 CONTOUR (STENTS) IMPLANT
STENT URO INLAY 6FRX24CM (STENTS) IMPLANT
SURGILUBE 2OZ TUBE FLIPTOP (MISCELLANEOUS) ×1 IMPLANT
WATER STERILE IRR 1000ML POUR (IV SOLUTION) ×1 IMPLANT
WATER STERILE IRR 500ML POUR (IV SOLUTION) ×1 IMPLANT

## 2023-12-03 NOTE — Op Note (Signed)
   Preoperative diagnosis:  Right hydronephrosis  Postoperative diagnosis:  Same  Procedure: Cystoscopy with right ureteral stent exchange  Surgeon: Riki Altes, MD  Anesthesia: MAC  Complications: None  Intraoperative findings:  Cystoscopy: No solid or papillary lesions present.  No stent encrustation noted  EBL: 0 mL  Specimens: None  Indication: [name] is a 82 y.o. 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  After reviewing the management options for treatment, he elected to proceed with the above surgical procedure(s). We have discussed the potential benefits and risks of the procedure, side effects of the proposed treatment, the likelihood of the patient achieving the goals of the procedure, and any potential problems that might occur during the procedure or recuperation. Informed consent has been obtained.  Description of procedure:  Sedation was obtained by anesthesia.  The patient was placed in the dorsal lithotomy position.  Her Foley catheter was removed and she was prepped and draped in the usual sterile fashion.  Preoperative antibiotics were administered. A preoperative time-out was performed.   A 21 French cystoscope with 30 degree lens lubricated and placed per urethra.  Panendoscopy is performed with findings as described above.  The stent was grasped with endoscopic forceps and brought out through the urethral meatus.  A 0.038 Sensor wire was placed through the stent and advanced into the renal pelvis on fluoroscopic guidance.  The stent was removed and a 33F/24 cm Bard Optima Inlay ureteral stent was advanced over the wire.  It was positioned in the renal pelvis under fluoroscopic guidance and the wire was removed.  Good positioning was noted on fluoroscopy proximally and distally.  She has chronic urinary retention and an 25 French Foley catheter was placed with 10 cc placed into the  balloon.  She was transported to the PACU in stable condition.  Plan: Continue annual stent exchange   Riki Altes, M.D.

## 2023-12-03 NOTE — Interval H&P Note (Signed)
 History and Physical Interval Note:  12/03/2023 8:39 AM  Stephanie [name]  has presented today for surgery, with the diagnosis of Right Hydronephrosis.  The various methods of treatment have been discussed with the patient and family. After consideration of risks, benefits and other options for treatment, the patient has consented to  Procedure(s): CYSTOSCOPY WITH STENT EXCHANGE (Right) as a surgical intervention.  The patient's history has been reviewed, patient examined, no change in status, stable for surgery.  I have reviewed the patient's chart and labs.  Questions were answered to the patient's satisfaction.     Jerris Fleer C Nanetta Wiegman

## 2023-12-03 NOTE — Transfer of Care (Signed)
 Immediate Anesthesia Transfer of Care Note  Patient: Stephanie [name]  Procedure(s) Performed: Procedure(s): CYSTOSCOPY WITH STENT EXCHANGE (Right)  Patient Location: PACU  Anesthesia Type:General  Level of Consciousness: sedated  Airway & Oxygen Therapy: Patient Spontanous Breathing and Patient connected to face mask oxygen  Post-op Assessment: Report given to RN and Post -op Vital signs reviewed and stable  Post vital signs: Reviewed and stable  Last Vitals:  Vitals:   12/03/23 0734 12/03/23 0915  BP: (!) 113/59 (!) 107/44  Pulse: (!) 58 (!) 50  Resp: 18 12  Temp: 36.6 C   SpO2: 96% 99%    Complications: No apparent anesthesia complications

## 2023-12-03 NOTE — H&P (Signed)
 Urology H&P   History of Present Illness: Stephanie [name] is a 83 y.o. 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  Her last exchange was 11/06/2022 and she presents today for cystoscopy with stent exchange.  She also has chronic urinary retention managed with an indwelling Foley catheter  Past Medical History:  Diagnosis Date   Anemia    Aortic atherosclerosis (HCC)    Arthritis    Asthma    CKD (chronic kidney disease), stage IV (HCC)    Colon cancer (HCC) 1996   Complication of anesthesia    a.) PONV   Diabetes mellitus without complication (HCC)    Dyspnea    Dysrhythmia    Foley catheter in place    GERD (gastroesophageal reflux disease)    Glaucoma    Hydronephrosis of right kidney    Hyperlipidemia    Hyperparathyroidism due to renal insufficiency (HCC)    Hypertension    Lumbar disc disease    Pneumonia    PONV (postoperative nausea and vomiting)    RBBB (right bundle branch block)    Rheumatoid arthritis involving multiple sites with positive rheumatoid factor (HCC)    Small bowel obstruction (HCC)    Urinary retention    Uses walker    Vitamin B 12 deficiency    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    has, does not wear    Past Surgical History:  Procedure Laterality Date   ABDOMINAL HYSTERECTOMY     BACK SURGERY  2006   x5  "screws and rods"   BREAST BIOPSY Right 11/01/2021   rt br biopsy calcs ribbon clip/ benign 2nd site LIQ   BREAST BIOPSY Right 11/01/2021   rt br stereo calcs coil clip 1st site / benign UIQ   COLON SURGERY     COLONOSCOPY WITH PROPOFOL N/A 05/22/2017   Procedure: COLONOSCOPY WITH PROPOFOL;  Surgeon: Scot Jun, MD;  Location: [facility] ENDOSCOPY;  Service: Endoscopy;  Laterality: N/A;   COLONOSCOPY WITH PROPOFOL N/A 06/08/2021   Procedure: COLONOSCOPY WITH PROPOFOL;  Surgeon: Midge Minium, MD;  Location: [facility];  Service: Endoscopy;  Laterality: N/A;  Diabetic   CYSTOSCOPY W/ RETROGRADES Right  11/06/2022   Procedure: CYSTOSCOPY WITH RETROGRADE PYELOGRAM;  Surgeon: Riki Altes, MD;  Location: [facility] ORS;  Service: Urology;  Laterality: Right;   CYSTOSCOPY W/ URETERAL STENT PLACEMENT  07/25/2021   Procedure: CYSTOSCOPY WITH RETROGRADE PYELOGRAM/URETERAL STENT PLACEMENT;  Surgeon: Riki Altes, MD;  Location: [facility] ORS;  Service: Urology;;   CYSTOSCOPY W/ URETERAL STENT PLACEMENT  02/06/2022   Procedure: CYSTOSCOPY WITH RETROGRADE PYELOGRAM/URETERAL STENT EXCHANGE;  Surgeon: Riki Altes, MD;  Location: [facility] ORS;  Service: Urology;;   CYSTOSCOPY W/ URETERAL STENT PLACEMENT Right 11/06/2022   Procedure: CYSTOSCOPY WITH STENT EXCHANGE;  Surgeon: Riki Altes, MD;  Location: [facility] ORS;  Service: Urology;  Laterality: Right;   CYSTOSCOPY WITH STENT PLACEMENT Right 08/02/2020   Procedure: CYSTOSCOPY WITH STENT EXCHANGE;  Surgeon: Riki Altes, MD;  Location: [facility] ORS;  Service: Urology;  Laterality: Right;   CYSTOSCOPY WITH STENT PLACEMENT Right 06/18/2020   Procedure: CYSTOSCOPY WITH STENT PLACEMENT;  Surgeon: Bjorn Pippin, MD;  Location: [facility] ORS;  Service: Urology;  Laterality: Right;   EYE SURGERY Bilateral    Cataracts   FRACTURE SURGERY Right 2011   foot surgery   JOINT REPLACEMENT Bilateral    knees   KNEE ARTHROSCOPY Bilateral    LAPAROTOMY N/A 06/30/2020  Procedure: EXPLORATORY LAPAROTOMY;  Surgeon: Leafy Ro, MD;  Location: [facility] ORS;  Service: General;  Laterality: N/A;   LUMBAR LAMINECTOMY     POLYPECTOMY  06/08/2021   Procedure: POLYPECTOMY;  Surgeon: Midge Minium, MD;  Location: [facility];  Service: Endoscopy;;   TEMPORARY DIALYSIS CATHETER N/A 06/27/2020   Procedure: TEMPORARY DIALYSIS CATHETER;  Surgeon: Renford Dills, MD;  Location: [facility] INVASIVE CV LAB;  Service: Cardiovascular;  Laterality: N/A;    Home Medications:  Current Meds  Medication Sig   acetaminophen (TYLENOL) 325 MG tablet Take 2 tablets (650 mg total) by mouth every 6  (six) hours as needed for moderate pain, fever or headache.   albuterol (VENTOLIN HFA) 108 (90 Base) MCG/ACT inhaler Inhale 1-2 puffs into the lungs every 6 (six) hours as needed for wheezing or shortness of breath.   ascorbic acid (VITAMIN C) 500 MG tablet Take 1 tablet (500 mg total) by mouth daily.   brimonidine (ALPHAGAN) 0.2 % ophthalmic solution Place 1 drop into both eyes 2 (two) times daily.   CONTOUR TEST test strip 1 each by Other route daily.    Cranberry 400 MG CAPS Take 400 mg by mouth daily.    diltiazem (CARDIZEM CD) 240 MG 24 hr capsule Take 240 mg by mouth daily.   fluticasone-salmeterol (ADVAIR) 100-50 MCG/ACT AEPB Inhale 1 puff into the lungs 2 (two) times daily.   glipiZIDE (GLUCOTROL) 10 MG tablet Take 5 mg by mouth daily.   hydrALAZINE (APRESOLINE) 25 MG tablet Take 50 mg by mouth in the morning, at noon, and at bedtime.   Iron-Vitamin C (VITRON-C) 65-125 MG TABS Take 1 tablet by mouth daily.   leflunomide (ARAVA) 10 MG tablet Take 10 mg by mouth daily.   Magnesium Oxide 250 MG TABS Take 250 mg by mouth 2 (two) times daily.   metoprolol tartrate (LOPRESSOR) 50 MG tablet Take 50 mg by mouth 2 (two) times daily.   omeprazole (PRILOSEC) 40 MG capsule Take 40 mg by mouth daily.   Probiotic Product (PROBIOTIC-10 PO) Take 1 capsule by mouth daily.   rosuvastatin (CRESTOR) 10 MG tablet Take 10 mg by mouth daily.    torsemide (DEMADEX) 10 MG tablet Take 10 mg by mouth daily.   triamcinolone (KENALOG) 0.025 % ointment Apply 1 application. topically 2 (two) times daily. (Patient taking differently: Apply 1 application  topically 2 (two) times daily as needed (irritation).)   vitamin B-12 (CYANOCOBALAMIN) 1000 MCG tablet Take 1,000 mcg by mouth daily.    Allergies:  Allergies  Allergen Reactions   Remicade [Infliximab] Hives, Shortness Of Breath and Itching   Ibuprofen Itching   Indomethacin Other (See Comments)    headache   Methotrexate Derivatives Other (See Comments)     Headache   Moexipril Other (See Comments)    unknown   Percocet [Oxycodone-Acetaminophen] Itching   Naprosyn [Naproxen] Rash    Family History  Problem Relation Age of Onset   Breast cancer Neg Hx     Social History:  reports that she has never smoked. She has never used smokeless tobacco. She reports that she does not drink alcohol and does not use drugs.  ROS: No fever, chills, shortness of breath or chest pain  Physical Exam:  Vital signs in last 24 hours: Temp:  [97.9 F (36.6 C)] 97.9 F (36.6 C) (03/04 0734) Pulse Rate:  [58] 58 (03/04 0734) Resp:  [18] 18 (03/04 0734) BP: (113)/(59) 113/59 (03/04 0734) SpO2:  [96 %] 96 % (  03/04 0734) Weight:  [70.3 kg] 70.3 kg (03/04 0734) Constitutional:  Alert and oriented, No acute distress HEENT: [place] AT Cardiovascular: Regular rate and rhythm. Respiratory: Normal respiratory effort, lungs clear bilaterally Psychiatric: Normal mood and affect   Laboratory Data:  Recent Labs    12/03/23 0755  HGB 10.2*  HCT 30.0*   Recent Labs    12/03/23 0755  NA 135  K 4.6  CL 107  GLUCOSE 250*  BUN 62*  CREATININE 2.80*   No results for input(s): "LABPT", "INR" in the last 72 hours. No results for input(s): "LABURIN" in the last 72 hours. Results for orders placed or performed in visit on 11/18/23  CULTURE, URINE COMPREHENSIVE     Status: Abnormal   Collection Time: 11/18/23 11:32 AM   Specimen: Urine   UR  Result Value Ref Range Status   Urine Culture, Comprehensive Final report (A)  Final   Organism ID, Bacteria Escherichia coli (A)  Final    Comment: 50,000-100,000 colony forming units per mL Cefazolin with an MIC <=16 predicts susceptibility to the oral agents cefaclor, cefdinir, cefpodoxime, cefprozil, cefuroxime, cephalexin, and loracarbef when used for therapy of uncomplicated urinary tract infections due to E. coli, Klebsiella pneumoniae, and Proteus mirabilis.    ANTIMICROBIAL SUSCEPTIBILITY Comment  Final     Comment:       ** S = Susceptible; I = Intermediate; R = Resistant **                    P = Positive; N = Negative             MICS are expressed in micrograms per mL    Antibiotic                 RSLT#1    RSLT#2    RSLT#3    RSLT#4 Amoxicillin/Clavulanic Acid    I Ampicillin                     R Cefazolin                      S Cefepime                       S Cefoxitin                      S Cefpodoxime                    S Ceftriaxone                    S Ciprofloxacin                  R Ertapenem                      S Gentamicin                     S Levofloxacin                   R Meropenem                      S Nitrofurantoin                 S Piperacillin/Tazobactam        S Tetracycline  R Tobramycin                     S Trimethoprim/Sulfa             R   Microscopic Examination     Status: Abnormal   Collection Time: 11/18/23 11:32 AM   Urine  Result Value Ref Range Status   WBC, UA >30 (A) 0 - 5 /hpf Final   RBC, Urine 11-30 (A) 0 - 2 /hpf Final   Epithelial Cells (non renal) 0-10 0 - 10 /hpf Final   Casts Present (A) None seen /lpf Final   Cast Type Granular casts (A) N/A Final   Crystals Present (A) N/A Final   Crystal Type Amorphous Sediment N/A Final   Mucus, UA Present (A) Not Estab. Final   Bacteria, UA Many (A) None seen/Few Final    Assessment/Plan: Presents for cystoscopy with right ureteral stent exchange All questions were answered and she desires to proceed   12/03/2023, 8:39 AM  Irineo Axon,  MD

## 2023-12-03 NOTE — Discharge Instructions (Signed)
 DISCHARGE INSTRUCTIONS FOR URETERAL STENT   MEDICATIONS:  1. Resume all your other meds from home.    ACTIVITY:  1.  As tolerated  SIGNS/SYMPTOMS TO CALL:  Common postoperative symptoms include bladder spasm and blood in the urine  Please call us if you have a fever greater than 101.5, uncontrolled nausea/vomiting, uncontrolled pain, dizziness, unable to urinate, excessively bloody urine, chest pain, shortness of breath, leg swelling, leg pain, or any other concerns or questions.   You can reach [place] at [phone].   FOLLOW-UP:  1. You will be contacted for a follow-up appointment

## 2023-12-03 NOTE — Anesthesia Preprocedure Evaluation (Signed)
 Anesthesia Evaluation  Patient identified by MRN, date of birth, ID band Patient awake    Reviewed: Allergy & Precautions, H&P , NPO status , Patient's Chart, lab work & pertinent test results, reviewed documented beta blocker date and time   History of Anesthesia Complications (+) PONV and history of anesthetic complications  Airway Mallampati: II  TM Distance: >3 FB Neck ROM: full    Dental no notable dental hx. (+) Teeth Intact   Pulmonary shortness of breath and with exertion, asthma , pneumonia, resolved   Pulmonary exam normal breath sounds clear to auscultation       Cardiovascular Exercise Tolerance: Poor hypertension, On Medications + dysrhythmias  Rhythm:regular Rate:Normal     Neuro/Psych negative neurological ROS  negative psych ROS   GI/Hepatic Neg liver ROS,GERD  Medicated,,  Endo/Other  negative endocrine ROSdiabetes, Poorly Controlled, Type 2    Renal/GU CRFRenal disease     Musculoskeletal   Abdominal   Peds  Hematology  (+) Blood dyscrasia, anemia   Anesthesia Other Findings   Reproductive/Obstetrics negative OB ROS                             Anesthesia Physical Anesthesia Plan  ASA: 3  Anesthesia Plan: MAC   Post-op Pain Management:    Induction:   PONV Risk Score and Plan: 4 or greater  Airway Management Planned:   Additional Equipment:   Intra-op Plan:   Post-operative Plan:   Informed Consent: I have reviewed the patients History and Physical, chart, labs and discussed the procedure including the risks, benefits and alternatives for the proposed anesthesia with the patient or authorized representative who has indicated his/her understanding and acceptance.       Plan Discussed with: CRNA  Anesthesia Plan Comments:        Anesthesia Quick Evaluation

## 2023-12-04 ENCOUNTER — Encounter: Payer: Self-pay | Admitting: Urology

## 2023-12-04 NOTE — Anesthesia Postprocedure Evaluation (Signed)
 Anesthesia Post Note  Patient: Stephanie [name]  Procedure(s) Performed: CYSTOSCOPY WITH STENT EXCHANGE (Right: Ureter)  Patient location during evaluation: PACU Anesthesia Type: MAC Level of consciousness: awake and alert Pain management: pain level controlled Vital Signs Assessment: post-procedure vital signs reviewed and stable Respiratory status: spontaneous breathing, nonlabored ventilation, respiratory function stable and patient connected to nasal cannula oxygen Cardiovascular status: blood pressure returned to baseline and stable Postop Assessment: no apparent nausea or vomiting Anesthetic complications: no   No notable events documented.   Last Vitals:  Vitals:   12/03/23 0948 12/03/23 1011  BP: 118/77 (!) 151/62  Pulse: (!) 51 (!) 52  Resp: (!) 22 16  Temp: (!) 36.1 C 36.6 C  SpO2: 99% 98%    Last Pain:  Vitals:   12/04/23 0837  TempSrc:   PainSc: 0-No pain                 Yevette Edwards

## 2023-12-09 ENCOUNTER — Ambulatory Visit: Payer: Medicare Other | Admitting: Physician Assistant

## 2023-12-10 ENCOUNTER — Inpatient Hospital Stay: Payer: Medicare Other

## 2023-12-10 ENCOUNTER — Inpatient Hospital Stay: Payer: Medicare Other | Attending: Internal Medicine

## 2023-12-10 DIAGNOSIS — D631 Anemia in chronic kidney disease: Secondary | ICD-10-CM | POA: Insufficient documentation

## 2023-12-10 DIAGNOSIS — D649 Anemia, unspecified: Secondary | ICD-10-CM

## 2023-12-10 DIAGNOSIS — N184 Chronic kidney disease, stage 4 (severe): Secondary | ICD-10-CM | POA: Diagnosis present

## 2023-12-10 DIAGNOSIS — Z79899 Other long term (current) drug therapy: Secondary | ICD-10-CM | POA: Diagnosis not present

## 2023-12-10 LAB — HEMOGLOBIN AND HEMATOCRIT (CANCER CENTER ONLY)
HCT: 32.9 % — ABNORMAL LOW (ref 36.0–46.0)
Hemoglobin: 9.9 g/dL — ABNORMAL LOW (ref 12.0–15.0)

## 2023-12-10 NOTE — Progress Notes (Unsigned)
 Pt is insistent that she does not get the injection when she is 11.9 , so close to 12. Per pt request injection held.

## 2024-01-02 ENCOUNTER — Ambulatory Visit (INDEPENDENT_AMBULATORY_CARE_PROVIDER_SITE_OTHER): Payer: Medicare Other | Admitting: Physician Assistant

## 2024-01-02 DIAGNOSIS — N1330 Unspecified hydronephrosis: Secondary | ICD-10-CM

## 2024-01-02 DIAGNOSIS — R339 Retention of urine, unspecified: Secondary | ICD-10-CM | POA: Diagnosis not present

## 2024-01-02 NOTE — Progress Notes (Signed)
 01/02/2024 10:58 AM   Stephanie [name] Jun 04, 1941 [id number]  CC: Chief Complaint  Patient presents with   Follow-up    Cath change    HPI: Stephanie [name] is a 83 y.o. 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 Lonna Cobb last month and chronic urinary retention managed with Foley catheter who presents today for stop follow-up and monthly Foley exchange.   Today she reports feeling well with no acute concerns.  She is tolerating her new stent without pain or gross hematuria.  She is due for labs with her nephrologist later today.  PMH: Past Medical History:  Diagnosis Date   Anemia    Aortic atherosclerosis (HCC)    Arthritis    Asthma    CKD (chronic kidney disease), stage IV (HCC)    Colon cancer (HCC) 1996   Complication of anesthesia    a.) PONV   Diabetes mellitus without complication (HCC)    Dyspnea    Dysrhythmia    Foley catheter in place    GERD (gastroesophageal reflux disease)    Glaucoma    Hydronephrosis of right kidney    Hyperlipidemia    Hyperparathyroidism due to renal insufficiency (HCC)    Hypertension    Lumbar disc disease    Pneumonia    PONV (postoperative nausea and vomiting)    RBBB (right bundle branch block)    Rheumatoid arthritis involving multiple sites with positive rheumatoid factor (HCC)    Small bowel obstruction (HCC)    Urinary retention    Uses walker    Vitamin B 12 deficiency    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    has, does not wear    Surgical History: Past Surgical History:  Procedure Laterality Date   ABDOMINAL HYSTERECTOMY     BACK SURGERY  2006   x5  "screws and rods"   BREAST BIOPSY Right 11/01/2021   rt br biopsy calcs ribbon clip/ benign 2nd site LIQ   BREAST BIOPSY Right 11/01/2021   rt br stereo calcs coil clip 1st site / benign UIQ   COLON SURGERY     COLONOSCOPY WITH PROPOFOL N/A 05/22/2017   Procedure: COLONOSCOPY WITH PROPOFOL;  Surgeon:  Scot Jun, MD;  Location: [facility] ENDOSCOPY;  Service: Endoscopy;  Laterality: N/A;   COLONOSCOPY WITH PROPOFOL N/A 06/08/2021   Procedure: COLONOSCOPY WITH PROPOFOL;  Surgeon: Midge Minium, MD;  Location: [facility] SURGERY [facility]TR;  Service: Endoscopy;  Laterality: N/A;  Diabetic   CYSTOSCOPY W/ RETROGRADES Right 11/06/2022   Procedure: CYSTOSCOPY WITH RETROGRADE PYELOGRAM;  Surgeon: Riki Altes, MD;  Location: [facility] ORS;  Service: Urology;  Laterality: Right;   CYSTOSCOPY W/ URETERAL STENT PLACEMENT  07/25/2021   Procedure: CYSTOSCOPY WITH RETROGRADE PYELOGRAM/URETERAL STENT PLACEMENT;  Surgeon: Riki Altes, MD;  Location: [facility] ORS;  Service: Urology;;   CYSTOSCOPY W/ URETERAL STENT PLACEMENT  02/06/2022   Procedure: CYSTOSCOPY WITH RETROGRADE PYELOGRAM/URETERAL STENT EXCHANGE;  Surgeon: Riki Altes, MD;  Location: [facility] ORS;  Service: Urology;;   CYSTOSCOPY W/ URETERAL STENT PLACEMENT Right 11/06/2022   Procedure: CYSTOSCOPY WITH STENT EXCHANGE;  Surgeon: Riki Altes, MD;  Location: [facility] ORS;  Service: Urology;  Laterality: Right;   CYSTOSCOPY W/ URETERAL STENT PLACEMENT Right 12/03/2023   Procedure: CYSTOSCOPY WITH STENT EXCHANGE;  Surgeon: Riki Altes, MD;  Location: [facility] ORS;  Service: Urology;  Laterality: Right;   CYSTOSCOPY WITH STENT PLACEMENT Right 08/02/2020   Procedure: CYSTOSCOPY  WITH STENT EXCHANGE;  Surgeon: Riki Altes, MD;  Location: [facility] ORS;  Service: Urology;  Laterality: Right;   CYSTOSCOPY WITH STENT PLACEMENT Right 06/18/2020   Procedure: CYSTOSCOPY WITH STENT PLACEMENT;  Surgeon: Bjorn Pippin, MD;  Location: [facility] ORS;  Service: Urology;  Laterality: Right;   EYE SURGERY Bilateral    Cataracts   FRACTURE SURGERY Right 2011   foot surgery   JOINT REPLACEMENT Bilateral    knees   KNEE ARTHROSCOPY Bilateral    LAPAROTOMY N/A 06/30/2020   Procedure: EXPLORATORY LAPAROTOMY;  Surgeon: Leafy Ro, MD;  Location: [facility] ORS;  Service: General;   Laterality: N/A;   LUMBAR LAMINECTOMY     POLYPECTOMY  06/08/2021   Procedure: POLYPECTOMY;  Surgeon: Midge Minium, MD;  Location: [facility] SURGERY [facility];  Service: Endoscopy;;   TEMPORARY DIALYSIS CATHETER N/A 06/27/2020   Procedure: TEMPORARY DIALYSIS CATHETER;  Surgeon: Renford Dills, MD;  Location: [facility] INVASIVE CV LAB;  Service: Cardiovascular;  Laterality: N/A;    Home Medications:  Allergies as of 01/02/2024       Reactions   Remicade [infliximab] Hives, Shortness Of Breath, Itching   Ibuprofen Itching   Indomethacin Other (See Comments)   headache   Methotrexate Derivatives Other (See Comments)   Headache   Moexipril Other (See Comments)   unknown   Percocet [oxycodone-acetaminophen] Itching   Naprosyn [naproxen] Rash        Medication List        Accurate as of January 02, 2024 10:58 AM. If you have any questions, ask your nurse or doctor.          acetaminophen 325 MG tablet Commonly known as: TYLENOL Take 2 tablets (650 mg total) by mouth every 6 (six) hours as needed for moderate pain, fever or headache.   albuterol 108 (90 Base) MCG/ACT inhaler Commonly known as: VENTOLIN HFA Inhale 1-2 puffs into the lungs every 6 (six) hours as needed for wheezing or shortness of breath.   ascorbic acid 500 MG tablet Commonly known as: VITAMIN C Take 1 tablet (500 mg total) by mouth daily.   brimonidine 0.2 % ophthalmic solution Commonly known as: ALPHAGAN Place 1 drop into both eyes 2 (two) times daily.   Contour Test test strip Generic drug: glucose blood 1 each by Other route daily.   Cranberry 400 MG Caps Take 400 mg by mouth daily.   cyanocobalamin 1000 MCG tablet Commonly known as: VITAMIN B12 Take 1,000 mcg by mouth daily.   diltiazem 240 MG 24 hr capsule Commonly known as: CARDIZEM CD Take 240 mg by mouth daily.   fluticasone-salmeterol 100-50 MCG/ACT Aepb Commonly known as: ADVAIR Inhale 1 puff into the lungs 2 (two) times daily.   glipiZIDE  10 MG tablet Commonly known as: GLUCOTROL Take 5 mg by mouth daily.   hydrALAZINE 25 MG tablet Commonly known as: APRESOLINE Take 50 mg by mouth in the morning, at noon, and at bedtime.   leflunomide 10 MG tablet Commonly known as: ARAVA Take 10 mg by mouth daily.   Magnesium Oxide 250 MG Tabs Take 250 mg by mouth 2 (two) times daily.   metoprolol tartrate 50 MG tablet Commonly known as: LOPRESSOR Take 50 mg by mouth 2 (two) times daily.   omeprazole 40 MG capsule Commonly known as: PRILOSEC Take 40 mg by mouth daily.   PROBIOTIC-10 PO Take 1 capsule by mouth daily.   rosuvastatin 10 MG tablet Commonly known as: CRESTOR Take 10 mg by mouth daily.  tiZANidine 4 MG tablet Commonly known as: ZANAFLEX Take 4 mg by mouth daily as needed for muscle spasms.   torsemide 10 MG tablet Commonly known as: DEMADEX Take 10 mg by mouth daily.   triamcinolone 0.025 % ointment Commonly known as: KENALOG Apply 1 application. topically 2 (two) times daily. What changed:  when to take this reasons to take this   Vitron-C 65-125 MG Tabs Generic drug: Iron-Vitamin C Take 1 tablet by mouth daily.        Allergies:  Allergies  Allergen Reactions   Remicade [Infliximab] Hives, Shortness Of Breath and Itching   Ibuprofen Itching   Indomethacin Other (See Comments)    headache   Methotrexate Derivatives Other (See Comments)    Headache   Moexipril Other (See Comments)    unknown   Percocet [Oxycodone-Acetaminophen] Itching   Naprosyn [Naproxen] Rash    Family History: Family History  Problem Relation Age of Onset   Breast cancer Neg Hx     Social History:   reports that she has never smoked. She has never used smokeless tobacco. She reports that she does not drink alcohol and does not use drugs.  Physical Exam: There were no vitals taken for this visit.  Constitutional:  Alert and oriented, no acute distress, nontoxic appearing HEENT: [place], AT Cardiovascular: No  clubbing, cyanosis, or edema Respiratory: Normal respiratory effort, no increased work of breathing Skin: No rashes, bruises or suspicious lesions Neurologic: Grossly intact, no focal deficits, moving all 4 extremities Psychiatric: Normal mood and affect  Cath Change/ Replacement  Patient is present today for a catheter change due to urinary retention.  8ml of water was removed from the balloon, a 18FR foley cath was removed without difficulty.  Patient was cleaned and prepped in a sterile fashion with betadinea. A 18 FR Silastic foley cath was replaced into the bladder, no complications were noted. Urine return was noted 20ml and urine was yellow in color. The balloon was filled with 10ml of sterile water. A leg bag was attached for drainage.  Patient tolerated well.    Performed by: Carman Ching, PA-C   Assessment & Plan:   1. Hydronephrosis of right kidney (Primary) Agree with repeat renal labs, will look at her results from her nephrologist at her next visit.  Will also get a renal ultrasound to reassess her hydronephrosis following stent replacement.  Continue annual stent exchanges, due next in March 2026. - US RENAL; Future  2. Urinary retention Monthly routine Foley exchange as above.  Return in about 4 weeks (around 01/30/2024) for Catheter exchange, Will call with results.  Carman Ching, PA-C  [facility] [place] [address] [place], [place] [address] [phone]

## 2024-01-06 ENCOUNTER — Other Ambulatory Visit: Payer: Self-pay | Admitting: *Deleted

## 2024-01-06 DIAGNOSIS — D649 Anemia, unspecified: Secondary | ICD-10-CM

## 2024-01-07 ENCOUNTER — Inpatient Hospital Stay: Payer: Medicare Other | Attending: Internal Medicine

## 2024-01-07 ENCOUNTER — Encounter: Payer: Self-pay | Admitting: Internal Medicine

## 2024-01-07 ENCOUNTER — Inpatient Hospital Stay (HOSPITAL_BASED_OUTPATIENT_CLINIC_OR_DEPARTMENT_OTHER): Payer: Medicare Other | Admitting: Internal Medicine

## 2024-01-07 ENCOUNTER — Inpatient Hospital Stay: Payer: Medicare Other

## 2024-01-07 VITALS — BP 141/63 | HR 60 | Temp 97.8°F | Resp 20 | Ht 66.0 in | Wt 151.6 lb

## 2024-01-07 DIAGNOSIS — E785 Hyperlipidemia, unspecified: Secondary | ICD-10-CM | POA: Insufficient documentation

## 2024-01-07 DIAGNOSIS — Z85038 Personal history of other malignant neoplasm of large intestine: Secondary | ICD-10-CM | POA: Insufficient documentation

## 2024-01-07 DIAGNOSIS — Z885 Allergy status to narcotic agent status: Secondary | ICD-10-CM | POA: Diagnosis not present

## 2024-01-07 DIAGNOSIS — N184 Chronic kidney disease, stage 4 (severe): Secondary | ICD-10-CM | POA: Insufficient documentation

## 2024-01-07 DIAGNOSIS — Z886 Allergy status to analgesic agent status: Secondary | ICD-10-CM | POA: Diagnosis not present

## 2024-01-07 DIAGNOSIS — E1122 Type 2 diabetes mellitus with diabetic chronic kidney disease: Secondary | ICD-10-CM | POA: Insufficient documentation

## 2024-01-07 DIAGNOSIS — R5383 Other fatigue: Secondary | ICD-10-CM | POA: Insufficient documentation

## 2024-01-07 DIAGNOSIS — Z90710 Acquired absence of both cervix and uterus: Secondary | ICD-10-CM | POA: Diagnosis not present

## 2024-01-07 DIAGNOSIS — Z79899 Other long term (current) drug therapy: Secondary | ICD-10-CM | POA: Insufficient documentation

## 2024-01-07 DIAGNOSIS — I129 Hypertensive chronic kidney disease with stage 1 through stage 4 chronic kidney disease, or unspecified chronic kidney disease: Secondary | ICD-10-CM | POA: Insufficient documentation

## 2024-01-07 DIAGNOSIS — D631 Anemia in chronic kidney disease: Secondary | ICD-10-CM | POA: Diagnosis present

## 2024-01-07 DIAGNOSIS — D649 Anemia, unspecified: Secondary | ICD-10-CM | POA: Diagnosis not present

## 2024-01-07 LAB — CBC WITH DIFFERENTIAL (CANCER CENTER ONLY)
Abs Immature Granulocytes: 0.04 10*3/uL (ref 0.00–0.07)
Basophils Absolute: 0.1 10*3/uL (ref 0.0–0.1)
Basophils Relative: 1 %
Eosinophils Absolute: 0.3 10*3/uL (ref 0.0–0.5)
Eosinophils Relative: 3 %
HCT: 32.6 % — ABNORMAL LOW (ref 36.0–46.0)
Hemoglobin: 9.7 g/dL — ABNORMAL LOW (ref 12.0–15.0)
Immature Granulocytes: 0 %
Lymphocytes Relative: 13 %
Lymphs Abs: 1.1 10*3/uL (ref 0.7–4.0)
MCH: 28.4 pg (ref 26.0–34.0)
MCHC: 29.8 g/dL — ABNORMAL LOW (ref 30.0–36.0)
MCV: 95.6 fL (ref 80.0–100.0)
Monocytes Absolute: 0.7 10*3/uL (ref 0.1–1.0)
Monocytes Relative: 8 %
Neutro Abs: 6.8 10*3/uL (ref 1.7–7.7)
Neutrophils Relative %: 75 %
Platelet Count: 236 10*3/uL (ref 150–400)
RBC: 3.41 MIL/uL — ABNORMAL LOW (ref 3.87–5.11)
RDW: 13.9 % (ref 11.5–15.5)
WBC Count: 9 10*3/uL (ref 4.0–10.5)
nRBC: 0 % (ref 0.0–0.2)

## 2024-01-07 LAB — IRON AND TIBC
Iron: 79 ug/dL (ref 28–170)
Saturation Ratios: 28 % (ref 10.4–31.8)
TIBC: 279 ug/dL (ref 250–450)
UIBC: 200 ug/dL

## 2024-01-07 LAB — BASIC METABOLIC PANEL - CANCER CENTER ONLY
Anion gap: 9 (ref 5–15)
BUN: 53 mg/dL — ABNORMAL HIGH (ref 8–23)
CO2: 19 mmol/L — ABNORMAL LOW (ref 22–32)
Calcium: 8.5 mg/dL — ABNORMAL LOW (ref 8.9–10.3)
Chloride: 106 mmol/L (ref 98–111)
Creatinine: 2.8 mg/dL — ABNORMAL HIGH (ref 0.44–1.00)
GFR, Estimated: 16 mL/min — ABNORMAL LOW (ref >60)
Glucose, Bld: 236 mg/dL — ABNORMAL HIGH (ref 70–99)
Potassium: 4.8 mmol/L (ref 3.5–5.1)
Sodium: 134 mmol/L — ABNORMAL LOW (ref 135–145)

## 2024-01-07 LAB — FERRITIN: Ferritin: 249 ng/mL (ref 11–307)

## 2024-01-07 MED ORDER — EPOETIN ALFA 20000 UNIT/ML IJ SOLN
20000.0000 [IU] | Freq: Once | INTRAMUSCULAR | Status: AC
  Administered 2024-01-07: 20000 [IU] via SUBCUTANEOUS
  Filled 2024-01-07: qty 1

## 2024-01-07 NOTE — Progress Notes (Unsigned)
 Fatigue/weakness: YES Dyspena: YES Light headedness: NO Blood in stool: NO  Fell 2 weeks ago, lost balance, no injuries.  Pt states she would like to have injection instead of infusion if needed, BP rises with infusion.

## 2024-01-07 NOTE — Assessment & Plan Note (Signed)
#   Chronic anemia-normocytic JUNE 2024- Hb 9; Normal WBC/platelets- recently getting worse.  Patient is quite symptomatic from her anemia.  The etiology is likely chronic renal disease. Labs- 2024- FEB 2024- iron sat  27; no ferritin-[PCP].  haptoglobin; erythropoietin; B12 folic acid; reticulocyte count; hepatitis panel; HIV; multiple myeloma panel.  Kappa lambda light chain ratio- NEGATIVE.  # DEC 2024-- I sat 26%; ferritin- 164- Hb today 9.8- continue vitron C one a day [black stool]. And continue retacrit- q monthly. HOLD venofer [elevated Blood pressure post venofer infusion].   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   # DISPOSITION: # Retacrit today; NO venofer  # 4 weeks- lab- H&H; possible retacrit # follow up 8 weeks- APP- labs- cbc/bmp; retacrit # 12 weeks- lab- H&H; possible retacrit # follow up 16  weeks- MD ; labs- cbc/bmp; iron studies; ferritin; possible venofer or retacrit-- Dr.B

## 2024-01-07 NOTE — Progress Notes (Unsigned)
 [facility] CONSULT NOTE  Patient Care Team: Danella Penton, MD as PCP - General (Internal Medicine) Katrinka Blazing Jethro Poling, NP (Inactive) as Nurse Practitioner (Hospice and Palliative Medicine) Earna Coder, MD as Consulting Physician (Oncology)  CHIEF COMPLAINTS/PURPOSE OF CONSULTATION: ANEMIA   HEMATOLOGY HISTORY  # ANEMIA[Hb; MCV-platelets- WBC; Iron sat; ferritin;  GFR- CT/US; EGD/colonoscopy-[in [place]]- 2022 sep [Dr.Wohl]  # CKD- [Dr.]  HISTORY OF PRESENTING ILLNESS: Patient ambulating-with assistance. Accompanied by husband  Stephanie [name] 83 y.o.  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 Stoioff] is here for follow-up of anemia.  Fell 2 weeks ago, lost balance, no injuries. No LOC.    Pt states she would like to have injection instead of infusion if needed, BP rises with infusio  Patient plaints of chronic dyspnea on walking.  Ambulates with a walker at home. Appetite is fair. Denies any dizziness. Has fatigue. No blood in stool that is visible.  Patient admits to compliance with her oral iron.   Review of Systems  Constitutional:  Positive for malaise/fatigue. Negative for chills, diaphoresis, fever and weight loss.  HENT:  Negative for nosebleeds and sore throat.   Eyes:  Negative for double vision.  Respiratory:  Negative for cough, hemoptysis, sputum production, shortness of breath and wheezing.   Cardiovascular:  Negative for chest pain, palpitations, orthopnea and leg swelling.  Gastrointestinal:  Negative for abdominal pain, blood in stool, constipation, diarrhea, heartburn, melena, nausea and vomiting.  Genitourinary:  Negative for dysuria, frequency and urgency.  Musculoskeletal:  Negative for back pain and joint pain.  Skin: Negative.  Negative for itching and rash.  Neurological:  Negative for dizziness, tingling, focal weakness, weakness and  headaches.  Endo/Heme/Allergies:  Does not bruise/bleed easily.  Psychiatric/Behavioral:  Negative for depression. The patient is not nervous/anxious and does not have insomnia.      MEDICAL HISTORY:  Past Medical History:  Diagnosis Date   Anemia    Aortic atherosclerosis (HCC)    Arthritis    Asthma    CKD (chronic kidney disease), stage IV (HCC)    Colon cancer (HCC) 1996   Complication of anesthesia    a.) PONV   Diabetes mellitus without complication (HCC)    Dyspnea    Dysrhythmia    Foley catheter in place    GERD (gastroesophageal reflux disease)    Glaucoma    Hydronephrosis of right kidney    Hyperlipidemia    Hyperparathyroidism due to renal insufficiency (HCC)    Hypertension    Lumbar disc disease    Pneumonia    PONV (postoperative nausea and vomiting)    RBBB (right bundle branch block)    Rheumatoid arthritis involving multiple sites with positive rheumatoid factor (HCC)    Small bowel obstruction (HCC)    Urinary retention    Uses walker    Vitamin B 12 deficiency    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    has, does not wear    SURGICAL HISTORY: Past Surgical History:  Procedure Laterality Date   ABDOMINAL HYSTERECTOMY     BACK SURGERY  2006   x5  "screws and rods"   BREAST BIOPSY Right 11/01/2021   rt br biopsy calcs ribbon clip/ benign 2nd site LIQ   BREAST BIOPSY Right 11/01/2021   rt br stereo calcs coil clip 1st site / benign UIQ   COLON SURGERY     COLONOSCOPY WITH PROPOFOL  N/A 05/22/2017   Procedure: COLONOSCOPY WITH PROPOFOL;  Surgeon: Scot Jun, MD;  Location: [facility] ENDOSCOPY;  Service: Endoscopy;  Laterality: N/A;   COLONOSCOPY WITH PROPOFOL N/A 06/08/2021   Procedure: COLONOSCOPY WITH PROPOFOL;  Surgeon: Midge Minium, MD;  Location: [facility] SURGERY [facility];  Service: Endoscopy;  Laterality: N/A;  Diabetic   CYSTOSCOPY W/ RETROGRADES Right 11/06/2022   Procedure: CYSTOSCOPY WITH RETROGRADE PYELOGRAM;  Surgeon: Riki Altes, MD;   Location: [facility] ORS;  Service: Urology;  Laterality: Right;   CYSTOSCOPY W/ URETERAL STENT PLACEMENT  07/25/2021   Procedure: CYSTOSCOPY WITH RETROGRADE PYELOGRAM/URETERAL STENT PLACEMENT;  Surgeon: Riki Altes, MD;  Location: [facility] ORS;  Service: Urology;;   CYSTOSCOPY W/ URETERAL STENT PLACEMENT  02/06/2022   Procedure: CYSTOSCOPY WITH RETROGRADE PYELOGRAM/URETERAL STENT EXCHANGE;  Surgeon: Riki Altes, MD;  Location: [facility] ORS;  Service: Urology;;   CYSTOSCOPY W/ URETERAL STENT PLACEMENT Right 11/06/2022   Procedure: CYSTOSCOPY WITH STENT EXCHANGE;  Surgeon: Riki Altes, MD;  Location: [facility] ORS;  Service: Urology;  Laterality: Right;   CYSTOSCOPY W/ URETERAL STENT PLACEMENT Right 12/03/2023   Procedure: CYSTOSCOPY WITH STENT EXCHANGE;  Surgeon: Riki Altes, MD;  Location: [facility] ORS;  Service: Urology;  Laterality: Right;   CYSTOSCOPY WITH STENT PLACEMENT Right 08/02/2020   Procedure: CYSTOSCOPY WITH STENT EXCHANGE;  Surgeon: Riki Altes, MD;  Location: [facility] ORS;  Service: Urology;  Laterality: Right;   CYSTOSCOPY WITH STENT PLACEMENT Right 06/18/2020   Procedure: CYSTOSCOPY WITH STENT PLACEMENT;  Surgeon: Bjorn Pippin, MD;  Location: [facility] ORS;  Service: Urology;  Laterality: Right;   EYE SURGERY Bilateral    Cataracts   FRACTURE SURGERY Right 2011   foot surgery   JOINT REPLACEMENT Bilateral    knees   KNEE ARTHROSCOPY Bilateral    LAPAROTOMY N/A 06/30/2020   Procedure: EXPLORATORY LAPAROTOMY;  Surgeon: Leafy Ro, MD;  Location: [facility] ORS;  Service: General;  Laterality: N/A;   LUMBAR LAMINECTOMY     POLYPECTOMY  06/08/2021   Procedure: POLYPECTOMY;  Surgeon: Midge Minium, MD;  Location: [facility]ERY [facility];  Service: Endoscopy;;   TEMPORARY DIALYSIS CATHETER N/A 06/27/2020   Procedure: TEMPORARY DIALYSIS CATHETER;  Surgeon: Renford Dills, MD;  Location: [facility] INVASIVE CV LAB;  Service: Cardiovascular;  Laterality: N/A;    SOCIAL HISTORY: Social History    Socioeconomic History   Marital status: Married    Spouse name: [name]   Number of children: Not on file   Years of education: Not on file   Highest education level: Not on file  Occupational History   Not on file  Tobacco Use   Smoking status: Never   Smokeless tobacco: Never  Vaping Use   Vaping status: Never Used  Substance and Sexual Activity   Alcohol use: No   Drug use: No   Sexual activity: Yes  Other Topics Concern   Not on file  Social History Narrative   Not on file   Social Drivers of Health   Financial Resource Strain: Low Risk  (09/05/2023)   Received from [facility]   Overall Financial Resource Strain (CARDIA)    Difficulty of Paying Living Expenses: Not hard at all  Food Insecurity: No Food Insecurity (09/05/2023)   Received from [facility]   Hunger Vital Sign    Worried About Running Out of Food in the Last Year: Never true    Ran Out of Food in the Last Year: Never true  Transportation Needs:  No Transportation Needs (09/05/2023)   Received from [facility] - Transportation    In the past 12 months, has lack of transportation kept you from medical appointments or from getting medications?: No    Lack of Transportation (Non-Medical): No  Physical Activity: Not on file  Stress: Not on file  Social Connections: Not on file  Intimate Partner Violence: Not At Risk (03/27/2023)   Humiliation, Afraid, Rape, and Kick questionnaire    Fear of Current or Ex-Partner: No    Emotionally Abused: No    Physically Abused: No    Sexually Abused: No    FAMILY HISTORY: Family History  Problem Relation Age of Onset   Breast cancer Neg Hx     ALLERGIES:  is allergic to remicade [infliximab], ibuprofen, indomethacin, methotrexate derivatives, moexipril, percocet [oxycodone-acetaminophen], and naprosyn [naproxen].  MEDICATIONS:  Current Outpatient Medications  Medication Sig Dispense Refill    acetaminophen (TYLENOL) 325 MG tablet Take 2 tablets (650 mg total) by mouth every 6 (six) hours as needed for moderate pain, fever or headache.     albuterol (VENTOLIN HFA) 108 (90 Base) MCG/ACT inhaler Inhale 1-2 puffs into the lungs every 6 (six) hours as needed for wheezing or shortness of breath.     ascorbic acid (VITAMIN C) 500 MG tablet Take 1 tablet (500 mg total) by mouth daily.     brimonidine (ALPHAGAN) 0.2 % ophthalmic solution Place 1 drop into both eyes 2 (two) times daily.     CONTOUR TEST test strip 1 each by Other route daily.   5   Cranberry 400 MG CAPS Take 400 mg by mouth daily.      diltiazem (CARDIZEM CD) 240 MG 24 hr capsule Take 240 mg by mouth daily.     glipiZIDE (GLUCOTROL) 10 MG tablet Take 5 mg by mouth daily.     hydrALAZINE (APRESOLINE) 25 MG tablet Take 50 mg by mouth in the morning, at noon, and at bedtime.     Iron-Vitamin C (VITRON-C) 65-125 MG TABS Take 1 tablet by mouth daily.     leflunomide (ARAVA) 10 MG tablet Take 10 mg by mouth daily.     Magnesium Oxide 250 MG TABS Take 250 mg by mouth 2 (two) times daily.     metoprolol tartrate (LOPRESSOR) 50 MG tablet Take 50 mg by mouth 2 (two) times daily.     omeprazole (PRILOSEC) 40 MG capsule Take 40 mg by mouth daily.     Probiotic Product (PROBIOTIC-10 PO) Take 1 capsule by mouth daily.     rosuvastatin (CRESTOR) 10 MG tablet Take 10 mg by mouth daily.      torsemide (DEMADEX) 10 MG tablet Take 10 mg by mouth daily.     vitamin B-12 (CYANOCOBALAMIN) 1000 MCG tablet Take 1,000 mcg by mouth daily.     VITAMIN D, CHOLECALCIFEROL, PO Take 1 capsule by mouth daily.     fluticasone-salmeterol (ADVAIR) 100-50 MCG/ACT AEPB Inhale 1 puff into the lungs 2 (two) times daily. (Patient not taking: Reported on 01/07/2024)     tiZANidine (ZANAFLEX) 4 MG tablet Take 4 mg by mouth daily as needed for muscle spasms. (Patient not taking: Reported on 01/07/2024)     triamcinolone (KENALOG) 0.025 % ointment Apply 1 application.  topically 2 (two) times daily. (Patient not taking: Reported on 01/07/2024) 30 g 0   No current facility-administered medications for this visit.     Marland Kitchen  PHYSICAL EXAMINATION:   Vitals:   01/07/24  0943  BP: (!) 141/63  Pulse: 60  Resp: 20  Temp: 97.8 F (36.6 C)  SpO2: 98%   Filed Weights   01/07/24 0943  Weight: 151 lb 9.6 oz (68.8 kg)   Bilateral basilar crackles  Physical Exam Vitals and nursing note reviewed.  HENT:     Head: Normocephalic and atraumatic.     Mouth/Throat:     Pharynx: Oropharynx is clear.  Eyes:     Extraocular Movements: Extraocular movements intact.     Pupils: Pupils are equal, round, and reactive to light.  Cardiovascular:     Rate and Rhythm: Normal rate and regular rhythm.  Pulmonary:     Comments: Decreased breath sounds bilaterally.  Abdominal:     Palpations: Abdomen is soft.  Musculoskeletal:        General: Normal range of motion.     Cervical back: Normal range of motion.  Skin:    General: Skin is warm.  Neurological:     General: No focal deficit present.     Mental Status: She is alert and oriented to person, place, and time.  Psychiatric:        Behavior: Behavior normal.        Judgment: Judgment normal.      LABORATORY DATA:  I have reviewed the data as listed Lab Results  Component Value Date   WBC 9.0 01/07/2024   HGB 9.7 (L) 01/07/2024   HCT 32.6 (L) 01/07/2024   MCV 95.6 01/07/2024   PLT 236 01/07/2024   Recent Labs    03/27/23 1222 05/28/23 1002 11/12/23 0853 11/27/23 0823 12/03/23 0755 01/07/24 0938  NA 135   < > 134* 132* 135 134*  K 4.7   < > 4.3 5.7* 4.6 4.8  CL 104   < > 108 105 107 106  CO2 23   < > 17* 20*  --  19*  GLUCOSE 146*   < > 151* 346* 250* 236*  BUN 46*   < > 69* 87* 62* 53*  CREATININE 2.19*   < > 3.25* 2.91* 2.80* 2.80*  CALCIUM 9.1   < > 8.4* 8.9  --  8.5*  GFRNONAA 22*   < > 14* 16*  --  16*  PROT 7.0  --   --   --   --   --   ALBUMIN 3.7  --   --   --   --   --   AST 19   --   --   --   --   --   ALT 14  --   --   --   --   --   ALKPHOS 118  --   --   --   --   --   BILITOT 0.1*  --   --   --   --   --    < > = values in this interval not displayed.     No results found.  ASSESSMENT & PLAN:   Symptomatic anemia # Chronic anemia-normocytic JUNE 2024- Hb 9; Normal WBC/platelets- recently getting worse.  Patient is quite symptomatic from her anemia.  The etiology is likely chronic renal disease. Labs- 2024- FEB 2024- iron sat  27; no ferritin-[PCP].  haptoglobin; erythropoietin; B12 folic acid; reticulocyte count; hepatitis panel; HIV; multiple myeloma panel.  Kappa lambda light chain ratio- NEGATIVE.  # DEC 2024-- I sat 26%; ferritin- 164- Hb today 9.8- continue vitron C one a day [black stool]. And  continue retacrit- q monthly. HOLD venofer [elevated Blood pressure post venofer infusion].   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   # DISPOSITION: # Retacrit today; NO venofer  # 4 weeks- lab- H&H; possible retacrit # follow up 8 weeks- APP- labs- cbc/bmp; retacrit # 12 weeks- lab- H&H; possible retacrit # follow up 16  weeks- MD ; labs- cbc/bmp; iron studies; ferritin; possible venofer or retacrit-- Dr.B    All questions were answered. The patient knows to call the clinic with any problems, questions or concerns.    Earna Coder, MD 01/08/2024 10:55 AM

## 2024-01-08 ENCOUNTER — Encounter: Payer: Self-pay | Admitting: Internal Medicine

## 2024-01-09 ENCOUNTER — Ambulatory Visit
Admission: RE | Admit: 2024-01-09 | Discharge: 2024-01-09 | Disposition: A | Discharge status: Home / Self Care | Source: Ambulatory Visit | Attending: Physician Assistant | Admitting: Physician Assistant

## 2024-01-09 DIAGNOSIS — N1330 Unspecified hydronephrosis: Secondary | ICD-10-CM | POA: Diagnosis present

## 2024-01-30 ENCOUNTER — Ambulatory Visit (INDEPENDENT_AMBULATORY_CARE_PROVIDER_SITE_OTHER): Admitting: Physician Assistant

## 2024-01-30 VITALS — BP 178/73 | HR 67 | Ht 66.0 in | Wt 152.0 lb

## 2024-01-30 DIAGNOSIS — Z466 Encounter for fitting and adjustment of urinary device: Secondary | ICD-10-CM | POA: Diagnosis not present

## 2024-01-30 NOTE — Progress Notes (Signed)
 Cath Change/ Replacement  Patient is present today for a catheter change due to urinary retention.  8ml of water  was removed from the balloon, a 16FR Silastic foley cath was removed without difficulty.  Patient was cleaned and prepped in a sterile fashion with betadine. A 16 FR Silastic foley cath was replaced into the bladder, no complications were noted. Urine return was noted 10ml and urine was yellow in color. The balloon was filled with 10ml of sterile water . A leg bag was attached for drainage.  Patient tolerated well.    Performed by: Kathreen Pare, PA-C   Follow up: Return in about 4 weeks (around 02/27/2024) for Catheter exchange.

## 2024-02-04 ENCOUNTER — Inpatient Hospital Stay

## 2024-02-04 ENCOUNTER — Inpatient Hospital Stay: Attending: Internal Medicine

## 2024-02-04 VITALS — BP 113/75 | HR 62

## 2024-02-04 DIAGNOSIS — Z79899 Other long term (current) drug therapy: Secondary | ICD-10-CM | POA: Diagnosis not present

## 2024-02-04 DIAGNOSIS — D631 Anemia in chronic kidney disease: Secondary | ICD-10-CM

## 2024-02-04 DIAGNOSIS — N185 Chronic kidney disease, stage 5: Secondary | ICD-10-CM | POA: Insufficient documentation

## 2024-02-04 DIAGNOSIS — D649 Anemia, unspecified: Secondary | ICD-10-CM

## 2024-02-04 LAB — HEMOGLOBIN AND HEMATOCRIT (CANCER CENTER ONLY)
HCT: 29.7 % — ABNORMAL LOW (ref 36.0–46.0)
Hemoglobin: 9.1 g/dL — ABNORMAL LOW (ref 12.0–15.0)

## 2024-02-04 MED ORDER — EPOETIN ALFA 20000 UNIT/ML IJ SOLN
20000.0000 [IU] | Freq: Once | INTRAMUSCULAR | Status: AC
  Administered 2024-02-04: 20000 [IU] via SUBCUTANEOUS
  Filled 2024-02-04: qty 1

## 2024-02-27 ENCOUNTER — Ambulatory Visit (INDEPENDENT_AMBULATORY_CARE_PROVIDER_SITE_OTHER): Admitting: Physician Assistant

## 2024-02-27 DIAGNOSIS — R339 Retention of urine, unspecified: Secondary | ICD-10-CM

## 2024-02-27 DIAGNOSIS — Z466 Encounter for fitting and adjustment of urinary device: Secondary | ICD-10-CM

## 2024-02-27 DIAGNOSIS — N185 Chronic kidney disease, stage 5: Secondary | ICD-10-CM

## 2024-02-27 NOTE — Progress Notes (Signed)
 Cath Change/ Replacement  Patient is present today for a catheter change due to urinary retention.  8ml of water  was removed from the balloon, a 16FR Silastic foley cath was removed without difficulty.  Patient was cleaned and prepped in a sterile fashion with betadine. A 16 FR Silastic foley cath was replaced into the bladder, no complications were noted. Urine return was not noted but the catheter was palpated in the urethra via the anterior vaginal wall. The balloon was filled with 10ml of sterile water . A leg bag was attached for drainage.  Patient tolerated well.    Performed by: Syris Brookens, PA-C   Additional notes: She had labs with Dr. Lydia Sams this week showing ESRD. They wonder what they can be doing to optimize her renal function. I gave them CKD diet info in their AVS. With chronic right ureteral stent, up to date on exchanges, and chronic Foley, she is optimized from the urologic perspective.  Follow up: Return in about 4 weeks (around 03/26/2024) for Catheter exchange.

## 2024-03-03 ENCOUNTER — Encounter: Payer: Self-pay | Admitting: Nurse Practitioner

## 2024-03-03 ENCOUNTER — Inpatient Hospital Stay (HOSPITAL_BASED_OUTPATIENT_CLINIC_OR_DEPARTMENT_OTHER): Admitting: Nurse Practitioner

## 2024-03-03 ENCOUNTER — Inpatient Hospital Stay

## 2024-03-03 ENCOUNTER — Inpatient Hospital Stay: Attending: Internal Medicine

## 2024-03-03 VITALS — BP 124/61 | HR 62 | Temp 97.8°F | Resp 16 | Wt 152.5 lb

## 2024-03-03 DIAGNOSIS — E1122 Type 2 diabetes mellitus with diabetic chronic kidney disease: Secondary | ICD-10-CM | POA: Diagnosis not present

## 2024-03-03 DIAGNOSIS — D631 Anemia in chronic kidney disease: Secondary | ICD-10-CM

## 2024-03-03 DIAGNOSIS — I129 Hypertensive chronic kidney disease with stage 1 through stage 4 chronic kidney disease, or unspecified chronic kidney disease: Secondary | ICD-10-CM | POA: Diagnosis not present

## 2024-03-03 DIAGNOSIS — K921 Melena: Secondary | ICD-10-CM | POA: Diagnosis not present

## 2024-03-03 DIAGNOSIS — D649 Anemia, unspecified: Secondary | ICD-10-CM

## 2024-03-03 DIAGNOSIS — R5383 Other fatigue: Secondary | ICD-10-CM | POA: Insufficient documentation

## 2024-03-03 DIAGNOSIS — E785 Hyperlipidemia, unspecified: Secondary | ICD-10-CM | POA: Diagnosis not present

## 2024-03-03 DIAGNOSIS — Z885 Allergy status to narcotic agent status: Secondary | ICD-10-CM | POA: Insufficient documentation

## 2024-03-03 DIAGNOSIS — N184 Chronic kidney disease, stage 4 (severe): Secondary | ICD-10-CM | POA: Insufficient documentation

## 2024-03-03 DIAGNOSIS — J45909 Unspecified asthma, uncomplicated: Secondary | ICD-10-CM | POA: Diagnosis not present

## 2024-03-03 DIAGNOSIS — Z886 Allergy status to analgesic agent status: Secondary | ICD-10-CM | POA: Insufficient documentation

## 2024-03-03 DIAGNOSIS — Z85038 Personal history of other malignant neoplasm of large intestine: Secondary | ICD-10-CM | POA: Diagnosis not present

## 2024-03-03 DIAGNOSIS — Z7969 Long term (current) use of other immunomodulators and immunosuppressants: Secondary | ICD-10-CM | POA: Insufficient documentation

## 2024-03-03 DIAGNOSIS — Z79899 Other long term (current) drug therapy: Secondary | ICD-10-CM | POA: Diagnosis not present

## 2024-03-03 DIAGNOSIS — Z90710 Acquired absence of both cervix and uterus: Secondary | ICD-10-CM | POA: Insufficient documentation

## 2024-03-03 LAB — BASIC METABOLIC PANEL WITH GFR
Anion gap: 8 (ref 5–15)
BUN: 52 mg/dL — ABNORMAL HIGH (ref 8–23)
CO2: 19 mmol/L — ABNORMAL LOW (ref 22–32)
Calcium: 8.2 mg/dL — ABNORMAL LOW (ref 8.9–10.3)
Chloride: 105 mmol/L (ref 98–111)
Creatinine, Ser: 3.08 mg/dL — ABNORMAL HIGH (ref 0.44–1.00)
GFR, Estimated: 15 mL/min — ABNORMAL LOW (ref >60)
Glucose, Bld: 242 mg/dL — ABNORMAL HIGH (ref 70–99)
Potassium: 4.5 mmol/L (ref 3.5–5.1)
Sodium: 132 mmol/L — ABNORMAL LOW (ref 135–145)

## 2024-03-03 LAB — CBC WITH DIFFERENTIAL (CANCER CENTER ONLY)
Abs Immature Granulocytes: 0.16 10*3/uL — ABNORMAL HIGH (ref 0.00–0.07)
Basophils Absolute: 0.1 10*3/uL (ref 0.0–0.1)
Basophils Relative: 1 %
Eosinophils Absolute: 0.2 10*3/uL (ref 0.0–0.5)
Eosinophils Relative: 2 %
HCT: 31 % — ABNORMAL LOW (ref 36.0–46.0)
Hemoglobin: 9.3 g/dL — ABNORMAL LOW (ref 12.0–15.0)
Immature Granulocytes: 2 %
Lymphocytes Relative: 9 %
Lymphs Abs: 0.8 10*3/uL (ref 0.7–4.0)
MCH: 28.1 pg (ref 26.0–34.0)
MCHC: 30 g/dL (ref 30.0–36.0)
MCV: 93.7 fL (ref 80.0–100.0)
Monocytes Absolute: 0.9 10*3/uL (ref 0.1–1.0)
Monocytes Relative: 10 %
Neutro Abs: 7.3 10*3/uL (ref 1.7–7.7)
Neutrophils Relative %: 76 %
Platelet Count: 256 10*3/uL (ref 150–400)
RBC: 3.31 MIL/uL — ABNORMAL LOW (ref 3.87–5.11)
RDW: 14.3 % (ref 11.5–15.5)
WBC Count: 9.4 10*3/uL (ref 4.0–10.5)
nRBC: 0 % (ref 0.0–0.2)

## 2024-03-03 MED ORDER — EPOETIN ALFA 20000 UNIT/ML IJ SOLN
20000.0000 [IU] | Freq: Once | INTRAMUSCULAR | Status: AC
  Administered 2024-03-03: 20000 [IU] via SUBCUTANEOUS
  Filled 2024-03-03: qty 1

## 2024-03-03 NOTE — Progress Notes (Signed)
 Pt in for follow up, recently saw Dr Annabell Key and increased fluid pill for swelling in feet and ankles.

## 2024-03-03 NOTE — Progress Notes (Signed)
 [facility] CONSULT NOTE  Patient Care Team: Sari Cunning, MD as PCP - General (Internal Medicine) Felipe Horton Lessie Ratel, NP (Inactive) as Nurse Practitioner (Hospice and Palliative Medicine) Gwyn Leos, MD as Consulting Physician (Oncology)  CHIEF COMPLAINTS/PURPOSE OF CONSULTATION: ANEMIA   HEMATOLOGY HISTORY  # ANEMIA[Hb; MCV-platelets- WBC; Iron  sat; ferritin;  GFR- CT/US ; EGD/colonoscopy-[in [place]]- 2022 sep [Dr.Wohl]  # CKD- [Dr.]  HISTORY OF PRESENTING ILLNESS: Patient ambulating-with assistance. Accompanied by husband  Stephanie [name] 83 y.o.  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 Stoioff] is here for follow-up of anemia. Previously had BP elevation with iv iron . Feels at baseline. Chronic dyspnea with ambulation. Uses walker or wheelchair. Appetite is fair. Denies dizziness. Fatigue is chronic. Taking oral iron . Denies any neurologic complaints. Denies recent fevers or illnesses. Denies any easy bleeding or bruising. No melena or hematochezia. No pica or restless leg. Reports good appetite and denies weight loss. Denies chest pain. Denies any nausea, vomiting, constipation, or diarrhea. Denies urinary complaints. Patient offers no further specific complaints today.   Review of Systems  Constitutional:  Positive for malaise/fatigue. Negative for chills, diaphoresis, fever and weight loss.  HENT:  Negative for nosebleeds and sore throat.   Eyes:  Negative for double vision.  Respiratory:  Negative for cough, hemoptysis, sputum production, shortness of breath and wheezing.   Cardiovascular:  Negative for chest pain, palpitations, orthopnea and leg swelling.  Gastrointestinal:  Negative for abdominal pain, blood in stool, constipation, diarrhea, heartburn, melena, nausea and vomiting.  Genitourinary:  Negative for dysuria, frequency and urgency.   Musculoskeletal:  Negative for back pain and joint pain.  Skin: Negative.  Negative for itching and rash.  Neurological:  Negative for dizziness, tingling, focal weakness, weakness and headaches.  Endo/Heme/Allergies:  Does not bruise/bleed easily.  Psychiatric/Behavioral:  Negative for depression. The patient is not nervous/anxious and does not have insomnia.    MEDICAL HISTORY:  Past Medical History:  Diagnosis Date   Anemia    Aortic atherosclerosis (HCC)    Arthritis    Asthma    CKD (chronic kidney disease), stage IV (HCC)    Colon cancer (HCC) 1996   Complication of anesthesia    a.) PONV   Diabetes mellitus without complication (HCC)    Dyspnea    Dysrhythmia    Foley catheter in place    GERD (gastroesophageal reflux disease)    Glaucoma    Hydronephrosis of right kidney    Hyperlipidemia    Hyperparathyroidism due to renal insufficiency (HCC)    Hypertension    Lumbar disc disease    Pneumonia    PONV (postoperative nausea and vomiting)    RBBB (right bundle branch block)    Rheumatoid arthritis involving multiple sites with positive rheumatoid factor (HCC)    Small bowel obstruction (HCC)    Urinary retention    Uses walker    Vitamin B 12 deficiency    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    has, does not wear   SURGICAL HISTORY: Past Surgical History:  Procedure Laterality Date   ABDOMINAL HYSTERECTOMY     BACK SURGERY  2006   x5  "screws and rods"   BREAST BIOPSY Right 11/01/2021   rt br biopsy calcs ribbon clip/ benign 2nd site LIQ   BREAST BIOPSY Right 11/01/2021   rt br stereo calcs coil clip 1st site / benign UIQ   COLON SURGERY  COLONOSCOPY WITH PROPOFOL  N/A 05/22/2017   Procedure: COLONOSCOPY WITH PROPOFOL ;  Surgeon: Cassie Click, MD;  Location: [facility];  Service: Endoscopy;  Laterality: N/A;   COLONOSCOPY WITH PROPOFOL  N/A 06/08/2021   Procedure: COLONOSCOPY WITH PROPOFOL ;  Surgeon: Marnee Sink, MD;  Location: [facility];   Service: Endoscopy;  Laterality: N/A;  Diabetic   CYSTOSCOPY W/ RETROGRADES Right 11/06/2022   Procedure: CYSTOSCOPY WITH RETROGRADE PYELOGRAM;  Surgeon: Geraline Knapp, MD;  Location: [facility] ORS;  Service: Urology;  Laterality: Right;   CYSTOSCOPY W/ URETERAL STENT PLACEMENT  07/25/2021   Procedure: CYSTOSCOPY WITH RETROGRADE PYELOGRAM/URETERAL STENT PLACEMENT;  Surgeon: Geraline Knapp, MD;  Location: [facility] OR[facility];  Service: Urology;;   CYSTOSCOPY W/ URETERAL STENT PLACEMENT  02/06/2022   Procedure: CYSTOSCOPY WITH RETROGRADE PYELOGRAM/URETERAL STENT EXCHANGE;  Surgeon: Geraline Knapp, MD;  Location: [facility];  Service: Urology;;   CYSTOSCOPY W/ URETERAL STENT PLACEMENT Right 11/06/2022   Procedure: CYSTOSCOPY WITH STENT EXCHANGE;  Surgeon: Geraline Knapp, MD;  Location: [facility];  Service: Urology;  Laterality: Right;   CYSTOSCOPY W/ URETERAL STENT PLACEMENT Right 12/03/2023   Procedure: CYSTOSCOPY WITH STENT EXCHANGE;  Surgeon: Geraline Knapp, MD;  Location: [facility];  Service: Urology;  Laterality: Right;   CYSTOSCOPY WITH STENT PLACEMENT Right 08/02/2020   Procedure: CYSTOSCOPY WITH STENT EXCHANGE;  Surgeon: Geraline Knapp, MD;  Location: [facility];  Service: Urology;  Laterality: Right;   CYSTOSCOPY WITH STENT PLACEMENT Right 06/18/2020   Procedure: CYSTOSCOPY WITH STENT PLACEMENT;  Surgeon: Homero Luster, MD;  Location: [facility];  Service: Urology;  Laterality: Right;   EYE SURGERY Bilateral    Cataracts   FRACTURE SURGERY Right 2011   foot surgery   JOINT REPLACEMENT Bilateral    knees   KNEE ARTHROSCOPY Bilateral    LAPAROTOMY N/A 06/30/2020   Procedure: EXPLORATORY LAPAROTOMY;  Surgeon: Alben Alma, MD;  Location: [facility];  Service: General;  Laterality: N/A;   LUMBAR LAMINECTOMY     POLYPECTOMY  06/08/2021   Procedure: POLYPECTOMY;  Surgeon: Marnee Sink, MD;  Location: [facility];  Service: Endoscopy;;   TEMPORARY DIALYSIS CATHETER N/A 06/27/2020   Procedure:  TEMPORARY DIALYSIS CATHETER;  Surgeon: Jackquelyn Mass, MD;  Location: [facility];  Service: Cardiovascular;  Laterality: N/A;    SOCIAL HISTORY: Social History   Socioeconomic History   Marital status: Married    Spouse name: [name]   Number of children: Not on file   Years of education: Not on file   Highest education level: Not on file  Occupational History   Not on file  Tobacco Use   Smoking status: Never   Smokeless tobacco: Never  Vaping Use   Vaping status: Never Used  Substance and Sexual Activity   Alcohol use: No   Drug use: No   Sexual activity: Yes  Other Topics Concern   Not on file  Social History Narrative   Not on file   Social Drivers of Health   Financial Resource Strain: Low Risk  (09/05/2023)   Received from [facility]   Overall Financial Resource Strain (CARDIA)    Difficulty of Paying Living Expenses: Not hard at all  Food Insecurity: No Food Insecurity (09/05/2023)   Received from [facility]   Hunger Vital Sign    Worried About Running Out of Food in the Last Year: Never true    Ran Out of Food in the Last Year: Never true  Transportation Needs: No Transportation Needs (09/05/2023)   Received from [facility] - Transportation    In the past 12 months, has lack of transportation kept you from medical appointments or from getting medications?: No    Lack of Transportation (Non-Medical): No  Physical Activity: Not on file  Stress: Not on file  Social Connections: Not on file  Intimate Partner Violence: Not At Risk (03/27/2023)   Humiliation, Afraid, Rape, and Kick questionnaire    Fear of Current or Ex-Partner: No    Emotionally Abused: No    Physically Abused: No    Sexually Abused: No   FAMILY HISTORY: Family History  Problem Relation Age of Onset   Breast cancer Neg Hx     ALLERGIES:  is allergic to remicade [infliximab], ibuprofen, indomethacin, methotrexate  derivatives, moexipril, percocet [oxycodone -acetaminophen ], and naprosyn [naproxen].  MEDICATIONS:  Current Outpatient Medications  Medication Sig Dispense Refill   acetaminophen  (TYLENOL ) 325 MG tablet Take 2 tablets (650 mg total) by mouth every 6 (six) hours as needed for moderate pain, fever or headache.     albuterol  (VENTOLIN  HFA) 108 (90 Base) MCG/ACT inhaler Inhale 1-2 puffs into the lungs every 6 (six) hours as needed for wheezing or shortness of breath.     ascorbic acid  (VITAMIN C) 500 MG tablet Take 1 tablet (500 mg total) by mouth daily.     brimonidine  (ALPHAGAN ) 0.2 % ophthalmic solution Place 1 drop into both eyes 2 (two) times daily.     CONTOUR TEST test strip 1 each by Other route daily.   5   Cranberry 400 MG CAPS Take 400 mg by mouth daily.      diltiazem  (CARDIZEM  CD) 240 MG 24 hr capsule Take 240 mg by mouth daily.     fluticasone-salmeterol (ADVAIR) 100-50 MCG/ACT AEPB Inhale 1 puff into the lungs 2 (two) times daily.     glipiZIDE  (GLUCOTROL ) 10 MG tablet Take 5 mg by mouth daily.     hydrALAZINE  (APRESOLINE ) 25 MG tablet Take 50 mg by mouth in the morning, at noon, and at bedtime.     Iron -Vitamin C (VITRON-C) 65-125 MG TABS Take 1 tablet by mouth daily.     leflunomide  (ARAVA ) 10 MG tablet Take 10 mg by mouth daily.     Magnesium  Oxide 250 MG TABS Take 250 mg by mouth 2 (two) times daily.     metoCLOPramide (REGLAN) 5 MG tablet SMARTSIG:1 Tablet(s) By Mouth Morning-Night     metoprolol  tartrate (LOPRESSOR ) 50 MG tablet Take 50 mg by mouth 2 (two) times daily.     omeprazole (PRILOSEC) 40 MG capsule Take 40 mg by mouth daily.     Probiotic Product (PROBIOTIC-10 PO) Take 1 capsule by mouth daily.     rosuvastatin  (CRESTOR ) 10 MG tablet Take 10 mg by mouth daily.      sulfaSALAzine (AZULFIDINE) 500 MG EC tablet Take 500 mg by mouth.     torsemide (DEMADEX) 10 MG tablet Take 10 mg by mouth daily. One in morning and one in the evening     triamcinolone  (KENALOG ) 0.025 %  ointment Apply 1 application. topically 2 (two) times daily. 30 g 0   vitamin B-12 (CYANOCOBALAMIN ) 1000 MCG tablet Take 1,000 mcg by mouth daily.     VITAMIN D, CHOLECALCIFEROL, PO Take 1 capsule by mouth daily.     tiZANidine  (ZANAFLEX ) 4 MG tablet Take 4 mg by mouth daily as needed for muscle spasms. (Patient not taking: Reported on  03/03/2024)     No current facility-administered medications for this visit.   Facility-Administered Medications Ordered in Other Visits  Medication Dose Route Frequency Provider Last Rate Last Admin   epoetin  alfa (EPOGEN ) injection 20,000 Units  20,000 Units Subcutaneous Once Brahmanday, Govinda R, MD         PHYSICAL EXAMINATION: Vitals:   03/03/24 1037  BP: 124/61  Pulse: 62  Resp: 16  Temp: 97.8 F (36.6 C)  SpO2: 96%   Filed Weights   03/03/24 1037  Weight: 152 lb 8 oz (69.2 kg)   Physical Exam Vitals reviewed.  HENT:     Head: Normocephalic and atraumatic.  Eyes:     Extraocular Movements: Extraocular movements intact.     Pupils: Pupils are equal, round, and reactive to light.  Pulmonary:     Comments: Decreased breath sounds bilaterally.  Abdominal:     General: There is no distension.     Palpations: Abdomen is soft.  Skin:    General: Skin is warm.     Coloration: Skin is not pale.     Findings: No rash.  Neurological:     General: No focal deficit present.     Mental Status: She is alert and oriented to person, place, and time.  Psychiatric:        Mood and Affect: Mood normal.        Behavior: Behavior normal.     LABORATORY DATA:  I have reviewed the data as listed Lab Results  Component Value Date   WBC 9.4 03/03/2024   HGB 9.3 (L) 03/03/2024   HCT 31.0 (L) 03/03/2024   MCV 93.7 03/03/2024   PLT 256 03/03/2024   Recent Labs    03/27/23 1222 05/28/23 1002 11/27/23 0823 12/03/23 0755 01/07/24 0938 03/03/24 1010  NA 135   < > 132* 135 134* 132*  K 4.7   < > 5.7* 4.6 4.8 4.5  CL 104   < > 105 107 106 105   CO2 23   < > 20*  --  19* 19*  GLUCOSE 146*   < > 346* 250* 236* 242*  BUN 46*   < > 87* 62* 53* 52*  CREATININE 2.19*   < > 2.91* 2.80* 2.80* 3.08*  CALCIUM  9.1   < > 8.9  --  8.5* 8.2*  GFRNONAA 22*   < > 16*  --  16* 15*  PROT 7.0  --   --   --   --   --   ALBUMIN  3.7  --   --   --   --   --   AST 19  --   --   --   --   --   ALT 14  --   --   --   --   --   ALKPHOS 118  --   --   --   --   --   BILITOT 0.1*  --   --   --   --   --    < > = values in this interval not displayed.  Iron /TIBC/Ferritin/ %Sat    Component Value Date/Time   IRON  79 01/07/2024 0938   TIBC 279 01/07/2024 0938   FERRITIN 249 01/07/2024 0938   IRONPCTSAT 28 01/07/2024 0938      No results found.  ASSESSMENT & PLAN:   Symptomatic anemia # Chronic anemia-normocytic JUNE 2024- Hb 9; Normal WBC/platelets- recently getting worse.  Patient is quite symptomatic from  her anemia.  The etiology is likely chronic renal disease. Labs- 2024- FEB 2024- iron  sat  27; no ferritin-[PCP].  haptoglobin; erythropoietin ; B12 folic acid ; reticulocyte count; hepatitis panel; HIV; multiple myeloma panel.  Kappa lambda light chain ratio- NEGATIVE.   # DEC 2024-- Iron  sat 26%; ferritin- 164- Hb today 9.8- continue vitron C one a day [black stool]. And continue retacrit - q monthly. Hemoglobin 9.3 today. Hold venofer  (prev BP elevation with venofer ). Proceed with retacrit  today.    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    # DISPOSITION: Retacrit  today F/u as scheduled- la  No problem-specific Assessment & Plan notes found for this encounter.  All questions were answered. The patient knows to call the clinic with any problems, questions or concerns.   Nelda Balsam, NP 03/03/2024

## 2024-03-22 ENCOUNTER — Emergency Department

## 2024-03-22 ENCOUNTER — Other Ambulatory Visit

## 2024-03-22 ENCOUNTER — Other Ambulatory Visit: Payer: Self-pay

## 2024-03-22 ENCOUNTER — Inpatient Hospital Stay
Admission: EM | Admit: 2024-03-22 | Discharge: 2024-05-01 | DRG: 698 | Disposition: E | Attending: Internal Medicine | Admitting: Internal Medicine

## 2024-03-22 ENCOUNTER — Encounter: Payer: Self-pay | Admitting: Emergency Medicine

## 2024-03-22 DIAGNOSIS — I120 Hypertensive chronic kidney disease with stage 5 chronic kidney disease or end stage renal disease: Secondary | ICD-10-CM | POA: Diagnosis present

## 2024-03-22 DIAGNOSIS — R54 Age-related physical debility: Secondary | ICD-10-CM | POA: Diagnosis present

## 2024-03-22 DIAGNOSIS — T83511A Infection and inflammatory reaction due to indwelling urethral catheter, initial encounter: Secondary | ICD-10-CM | POA: Diagnosis present

## 2024-03-22 DIAGNOSIS — A419 Sepsis, unspecified organism: Secondary | ICD-10-CM | POA: Diagnosis not present

## 2024-03-22 DIAGNOSIS — Z66 Do not resuscitate: Secondary | ICD-10-CM | POA: Diagnosis present

## 2024-03-22 DIAGNOSIS — Z885 Allergy status to narcotic agent status: Secondary | ICD-10-CM

## 2024-03-22 DIAGNOSIS — N184 Chronic kidney disease, stage 4 (severe): Secondary | ICD-10-CM | POA: Diagnosis present

## 2024-03-22 DIAGNOSIS — Z90710 Acquired absence of both cervix and uterus: Secondary | ICD-10-CM

## 2024-03-22 DIAGNOSIS — Z7984 Long term (current) use of oral hypoglycemic drugs: Secondary | ICD-10-CM

## 2024-03-22 DIAGNOSIS — E8721 Acute metabolic acidosis: Secondary | ICD-10-CM | POA: Diagnosis present

## 2024-03-22 DIAGNOSIS — G9341 Metabolic encephalopathy: Secondary | ICD-10-CM | POA: Diagnosis not present

## 2024-03-22 DIAGNOSIS — E782 Mixed hyperlipidemia: Secondary | ICD-10-CM | POA: Diagnosis present

## 2024-03-22 DIAGNOSIS — D631 Anemia in chronic kidney disease: Secondary | ICD-10-CM | POA: Diagnosis present

## 2024-03-22 DIAGNOSIS — Z1624 Resistance to multiple antibiotics: Secondary | ICD-10-CM | POA: Diagnosis present

## 2024-03-22 DIAGNOSIS — E1122 Type 2 diabetes mellitus with diabetic chronic kidney disease: Secondary | ICD-10-CM | POA: Diagnosis present

## 2024-03-22 DIAGNOSIS — N186 End stage renal disease: Secondary | ICD-10-CM | POA: Diagnosis present

## 2024-03-22 DIAGNOSIS — N2581 Secondary hyperparathyroidism of renal origin: Secondary | ICD-10-CM | POA: Diagnosis present

## 2024-03-22 DIAGNOSIS — N3001 Acute cystitis with hematuria: Secondary | ICD-10-CM | POA: Diagnosis present

## 2024-03-22 DIAGNOSIS — W1830XA Fall on same level, unspecified, initial encounter: Secondary | ICD-10-CM | POA: Diagnosis present

## 2024-03-22 DIAGNOSIS — M0579 Rheumatoid arthritis with rheumatoid factor of multiple sites without organ or systems involvement: Secondary | ICD-10-CM | POA: Diagnosis present

## 2024-03-22 DIAGNOSIS — N179 Acute kidney failure, unspecified: Secondary | ICD-10-CM | POA: Diagnosis present

## 2024-03-22 DIAGNOSIS — E1169 Type 2 diabetes mellitus with other specified complication: Secondary | ICD-10-CM | POA: Diagnosis present

## 2024-03-22 DIAGNOSIS — Z515 Encounter for palliative care: Secondary | ICD-10-CM

## 2024-03-22 DIAGNOSIS — Z992 Dependence on renal dialysis: Secondary | ICD-10-CM | POA: Diagnosis not present

## 2024-03-22 DIAGNOSIS — H9193 Unspecified hearing loss, bilateral: Secondary | ICD-10-CM | POA: Diagnosis present

## 2024-03-22 DIAGNOSIS — N135 Crossing vessel and stricture of ureter without hydronephrosis: Secondary | ICD-10-CM | POA: Diagnosis present

## 2024-03-22 DIAGNOSIS — B958 Unspecified staphylococcus as the cause of diseases classified elsewhere: Secondary | ICD-10-CM | POA: Diagnosis present

## 2024-03-22 DIAGNOSIS — N185 Chronic kidney disease, stage 5: Secondary | ICD-10-CM | POA: Diagnosis present

## 2024-03-22 DIAGNOSIS — Y846 Urinary catheterization as the cause of abnormal reaction of the patient, or of later complication, without mention of misadventure at the time of the procedure: Secondary | ICD-10-CM | POA: Diagnosis present

## 2024-03-22 DIAGNOSIS — K56609 Unspecified intestinal obstruction, unspecified as to partial versus complete obstruction: Secondary | ICD-10-CM

## 2024-03-22 DIAGNOSIS — Z7951 Long term (current) use of inhaled steroids: Secondary | ICD-10-CM

## 2024-03-22 DIAGNOSIS — K56600 Partial intestinal obstruction, unspecified as to cause: Secondary | ICD-10-CM | POA: Diagnosis not present

## 2024-03-22 DIAGNOSIS — E871 Hypo-osmolality and hyponatremia: Secondary | ICD-10-CM | POA: Diagnosis present

## 2024-03-22 DIAGNOSIS — K5641 Fecal impaction: Secondary | ICD-10-CM | POA: Diagnosis not present

## 2024-03-22 DIAGNOSIS — I482 Chronic atrial fibrillation, unspecified: Secondary | ICD-10-CM | POA: Diagnosis present

## 2024-03-22 DIAGNOSIS — R6520 Severe sepsis without septic shock: Secondary | ICD-10-CM | POA: Diagnosis present

## 2024-03-22 DIAGNOSIS — J4489 Other specified chronic obstructive pulmonary disease: Secondary | ICD-10-CM | POA: Diagnosis present

## 2024-03-22 DIAGNOSIS — Z7189 Other specified counseling: Secondary | ICD-10-CM | POA: Diagnosis not present

## 2024-03-22 DIAGNOSIS — Z1152 Encounter for screening for COVID-19: Secondary | ICD-10-CM

## 2024-03-22 DIAGNOSIS — Y92008 Other place in unspecified non-institutional (private) residence as the place of occurrence of the external cause: Secondary | ICD-10-CM | POA: Diagnosis not present

## 2024-03-22 DIAGNOSIS — Z789 Other specified health status: Secondary | ICD-10-CM | POA: Diagnosis not present

## 2024-03-22 DIAGNOSIS — I480 Paroxysmal atrial fibrillation: Secondary | ICD-10-CM | POA: Diagnosis present

## 2024-03-22 DIAGNOSIS — Z79899 Other long term (current) drug therapy: Secondary | ICD-10-CM

## 2024-03-22 DIAGNOSIS — R5381 Other malaise: Secondary | ICD-10-CM | POA: Diagnosis present

## 2024-03-22 DIAGNOSIS — N136 Pyonephrosis: Secondary | ICD-10-CM | POA: Diagnosis present

## 2024-03-22 DIAGNOSIS — I4510 Unspecified right bundle-branch block: Secondary | ICD-10-CM | POA: Diagnosis present

## 2024-03-22 DIAGNOSIS — Z4901 Encounter for fitting and adjustment of extracorporeal dialysis catheter: Secondary | ICD-10-CM | POA: Diagnosis not present

## 2024-03-22 DIAGNOSIS — K219 Gastro-esophageal reflux disease without esophagitis: Secondary | ICD-10-CM | POA: Diagnosis present

## 2024-03-22 DIAGNOSIS — N189 Chronic kidney disease, unspecified: Secondary | ICD-10-CM | POA: Diagnosis not present

## 2024-03-22 DIAGNOSIS — Z85038 Personal history of other malignant neoplasm of large intestine: Secondary | ICD-10-CM

## 2024-03-22 DIAGNOSIS — E1129 Type 2 diabetes mellitus with other diabetic kidney complication: Secondary | ICD-10-CM | POA: Diagnosis present

## 2024-03-22 DIAGNOSIS — Z886 Allergy status to analgesic agent status: Secondary | ICD-10-CM

## 2024-03-22 DIAGNOSIS — K567 Ileus, unspecified: Secondary | ICD-10-CM | POA: Diagnosis not present

## 2024-03-22 DIAGNOSIS — E876 Hypokalemia: Secondary | ICD-10-CM | POA: Diagnosis present

## 2024-03-22 DIAGNOSIS — A4151 Sepsis due to Escherichia coli [E. coli]: Secondary | ICD-10-CM | POA: Diagnosis present

## 2024-03-22 DIAGNOSIS — H409 Unspecified glaucoma: Secondary | ICD-10-CM | POA: Diagnosis present

## 2024-03-22 DIAGNOSIS — E7800 Pure hypercholesterolemia, unspecified: Secondary | ICD-10-CM | POA: Diagnosis present

## 2024-03-22 DIAGNOSIS — N390 Urinary tract infection, site not specified: Secondary | ICD-10-CM | POA: Diagnosis present

## 2024-03-22 LAB — URINALYSIS, COMPLETE (UACMP) WITH MICROSCOPIC
Bilirubin Urine: NEGATIVE
Glucose, UA: NEGATIVE mg/dL
Hgb urine dipstick: NEGATIVE
Ketones, ur: NEGATIVE mg/dL
Nitrite: NEGATIVE
Protein, ur: >=300 mg/dL — AB
Specific Gravity, Urine: 1.016 (ref 1.005–1.030)
WBC, UA: >50 WBC/hpf (ref 0–5)
pH: 5 (ref 5.0–8.0)

## 2024-03-22 LAB — CBC WITH DIFFERENTIAL/PLATELET
Abs Immature Granulocytes: 0.1 10*3/uL — ABNORMAL HIGH (ref 0.00–0.07)
Basophils Absolute: 0.1 10*3/uL (ref 0.0–0.1)
Basophils Relative: 0 %
Eosinophils Absolute: 0 10*3/uL (ref 0.0–0.5)
Eosinophils Relative: 0 %
HCT: 30.8 % — ABNORMAL LOW (ref 36.0–46.0)
Hemoglobin: 9.3 g/dL — ABNORMAL LOW (ref 12.0–15.0)
Immature Granulocytes: 1 %
Lymphocytes Relative: 6 %
Lymphs Abs: 1.2 10*3/uL (ref 0.7–4.0)
MCH: 29.3 pg (ref 26.0–34.0)
MCHC: 30.2 g/dL (ref 30.0–36.0)
MCV: 97.2 fL (ref 80.0–100.0)
Monocytes Absolute: 2.6 10*3/uL — ABNORMAL HIGH (ref 0.1–1.0)
Monocytes Relative: 14 %
Neutro Abs: 14.4 10*3/uL — ABNORMAL HIGH (ref 1.7–7.7)
Neutrophils Relative %: 79 %
Platelets: 208 10*3/uL (ref 150–400)
RBC: 3.17 MIL/uL — ABNORMAL LOW (ref 3.87–5.11)
RDW: 15.3 % (ref 11.5–15.5)
WBC: 18.3 10*3/uL — ABNORMAL HIGH (ref 4.0–10.5)
nRBC: 0 % (ref 0.0–0.2)

## 2024-03-22 LAB — GLUCOSE, CAPILLARY
Glucose-Capillary: 120 mg/dL — ABNORMAL HIGH (ref 70–99)
Glucose-Capillary: 149 mg/dL — ABNORMAL HIGH (ref 70–99)

## 2024-03-22 LAB — TROPONIN I (HIGH SENSITIVITY)
Troponin I (High Sensitivity): 19 ng/L — ABNORMAL HIGH (ref <18)
Troponin I (High Sensitivity): 20 ng/L — ABNORMAL HIGH (ref <18)

## 2024-03-22 LAB — RESP PANEL BY RT-PCR (RSV, FLU A&B, COVID)  RVPGX2
Influenza A by PCR: NEGATIVE
Influenza B by PCR: NEGATIVE
Resp Syncytial Virus by PCR: NEGATIVE
SARS Coronavirus 2 by RT PCR: NEGATIVE

## 2024-03-22 LAB — COMPREHENSIVE METABOLIC PANEL WITH GFR
ALT: 87 U/L — ABNORMAL HIGH (ref 0–44)
AST: 127 U/L — ABNORMAL HIGH (ref 15–41)
Albumin: 3.1 g/dL — ABNORMAL LOW (ref 3.5–5.0)
Alkaline Phosphatase: 108 U/L (ref 38–126)
Anion gap: 9 (ref 5–15)
BUN: 53 mg/dL — ABNORMAL HIGH (ref 8–23)
CO2: 20 mmol/L — ABNORMAL LOW (ref 22–32)
Calcium: 8.4 mg/dL — ABNORMAL LOW (ref 8.9–10.3)
Chloride: 105 mmol/L (ref 98–111)
Creatinine, Ser: 3.32 mg/dL — ABNORMAL HIGH (ref 0.44–1.00)
GFR, Estimated: 13 mL/min — ABNORMAL LOW (ref >60)
Glucose, Bld: 191 mg/dL — ABNORMAL HIGH (ref 70–99)
Potassium: 5.1 mmol/L (ref 3.5–5.1)
Sodium: 134 mmol/L — ABNORMAL LOW (ref 135–145)
Total Bilirubin: 0.8 mg/dL (ref 0.0–1.2)
Total Protein: 5.5 g/dL — ABNORMAL LOW (ref 6.5–8.1)

## 2024-03-22 LAB — LIPASE, BLOOD: Lipase: 28 U/L (ref 11–51)

## 2024-03-22 LAB — LACTIC ACID, PLASMA
Lactic Acid, Venous: 2 mmol/L (ref 0.5–1.9)
Lactic Acid, Venous: 1.4 mmol/L (ref 0.5–1.9)

## 2024-03-22 LAB — CBG MONITORING, ED: Glucose-Capillary: 164 mg/dL — ABNORMAL HIGH (ref 70–99)

## 2024-03-22 MED ORDER — INSULIN ASPART 100 UNIT/ML IJ SOLN
0.0000 [IU] | Freq: Three times a day (TID) | INTRAMUSCULAR | Status: DC
  Administered 2024-03-22: 2 [IU] via SUBCUTANEOUS
  Administered 2024-03-23 (×2): 1 [IU] via SUBCUTANEOUS
  Administered 2024-03-24: 3 [IU] via SUBCUTANEOUS
  Administered 2024-03-24: 2 [IU] via SUBCUTANEOUS
  Administered 2024-03-24: 1 [IU] via SUBCUTANEOUS
  Administered 2024-03-25: 2 [IU] via SUBCUTANEOUS
  Administered 2024-03-25 (×2): 3 [IU] via SUBCUTANEOUS
  Administered 2024-03-26: 2 [IU] via SUBCUTANEOUS
  Administered 2024-03-26: 1 [IU] via SUBCUTANEOUS
  Administered 2024-03-26 – 2024-03-27 (×3): 2 [IU] via SUBCUTANEOUS
  Administered 2024-03-29: 5 [IU] via SUBCUTANEOUS
  Administered 2024-03-29: 2 [IU] via SUBCUTANEOUS
  Administered 2024-03-29: 1 [IU] via SUBCUTANEOUS
  Administered 2024-03-30 (×2): 7 [IU] via SUBCUTANEOUS
  Administered 2024-03-30: 3 [IU] via SUBCUTANEOUS
  Administered 2024-03-31: 5 [IU] via SUBCUTANEOUS
  Administered 2024-03-31: 3 [IU] via SUBCUTANEOUS
  Administered 2024-03-31 – 2024-04-01 (×3): 2 [IU] via SUBCUTANEOUS
  Administered 2024-04-01 – 2024-04-02 (×2): 1 [IU] via SUBCUTANEOUS
  Administered 2024-04-02 (×2): 2 [IU] via SUBCUTANEOUS
  Administered 2024-04-03: 1 [IU] via SUBCUTANEOUS
  Filled 2024-03-22 (×27): qty 1

## 2024-03-22 MED ORDER — HYDRALAZINE HCL 20 MG/ML IJ SOLN
5.0000 mg | INTRAMUSCULAR | Status: DC | PRN
  Administered 2024-03-23: 5 mg via INTRAVENOUS
  Filled 2024-03-22: qty 1

## 2024-03-22 MED ORDER — ACETAMINOPHEN 325 MG PO TABS
650.0000 mg | ORAL_TABLET | Freq: Four times a day (QID) | ORAL | Status: DC | PRN
Start: 2024-03-22 — End: 2024-04-06
  Administered 2024-03-30: 650 mg via ORAL
  Filled 2024-03-22: qty 2

## 2024-03-22 MED ORDER — SODIUM CHLORIDE 0.9 % IV BOLUS
500.0000 mL | Freq: Once | INTRAVENOUS | Status: AC
  Administered 2024-03-22: 500 mL via INTRAVENOUS

## 2024-03-22 MED ORDER — ONDANSETRON HCL 4 MG PO TABS
4.0000 mg | ORAL_TABLET | Freq: Four times a day (QID) | ORAL | Status: DC | PRN
  Administered 2024-03-26: 4 mg via ORAL
  Filled 2024-03-22: qty 1

## 2024-03-22 MED ORDER — ALBUTEROL SULFATE (2.5 MG/3ML) 0.083% IN NEBU: 3.0000 mL | INHALATION_SOLUTION | Freq: Four times a day (QID) | RESPIRATORY_TRACT | Status: DC | PRN

## 2024-03-22 MED ORDER — HYDRALAZINE HCL 50 MG PO TABS
50.0000 mg | ORAL_TABLET | Freq: Three times a day (TID) | ORAL | Status: DC
  Administered 2024-03-22 – 2024-04-02 (×29): 50 mg via ORAL
  Filled 2024-03-22 (×32): qty 1

## 2024-03-22 MED ORDER — SODIUM CHLORIDE 0.9% FLUSH
3.0000 mL | Freq: Two times a day (BID) | INTRAVENOUS | Status: DC
  Administered 2024-03-22 – 2024-04-05 (×29): 3 mL via INTRAVENOUS

## 2024-03-22 MED ORDER — ROSUVASTATIN CALCIUM 10 MG PO TABS
10.0000 mg | ORAL_TABLET | Freq: Every day | ORAL | Status: DC
  Administered 2024-03-22 – 2024-04-01 (×11): 10 mg via ORAL
  Filled 2024-03-22 (×13): qty 1

## 2024-03-22 MED ORDER — SODIUM CHLORIDE 0.9 % IV SOLN
500.0000 mg | Freq: Two times a day (BID) | INTRAVENOUS | Status: DC
  Administered 2024-03-22 – 2024-03-24 (×5): 500 mg via INTRAVENOUS
  Filled 2024-03-22 (×6): qty 10

## 2024-03-22 MED ORDER — DILTIAZEM HCL ER COATED BEADS 120 MG PO CP24
240.0000 mg | ORAL_CAPSULE | Freq: Every day | ORAL | Status: DC
  Administered 2024-03-22 – 2024-03-23 (×2): 240 mg via ORAL
  Filled 2024-03-22: qty 1
  Filled 2024-03-22 (×2): qty 2

## 2024-03-22 MED ORDER — ONDANSETRON HCL 4 MG/2ML IJ SOLN
4.0000 mg | Freq: Once | INTRAMUSCULAR | Status: AC
  Administered 2024-03-22: 4 mg via INTRAVENOUS
  Filled 2024-03-22: qty 2

## 2024-03-22 MED ORDER — METOCLOPRAMIDE HCL 5 MG PO TABS
5.0000 mg | ORAL_TABLET | Freq: Two times a day (BID) | ORAL | Status: DC
  Administered 2024-03-22 – 2024-04-01 (×22): 5 mg via ORAL
  Filled 2024-03-22 (×25): qty 1

## 2024-03-22 MED ORDER — DOCUSATE SODIUM 100 MG PO CAPS
100.0000 mg | ORAL_CAPSULE | Freq: Two times a day (BID) | ORAL | Status: DC
Start: 2024-03-22 — End: 2024-03-25
  Administered 2024-03-22 – 2024-03-25 (×7): 100 mg via ORAL
  Filled 2024-03-22 (×7): qty 1

## 2024-03-22 MED ORDER — PANTOPRAZOLE SODIUM 40 MG PO TBEC
40.0000 mg | DELAYED_RELEASE_TABLET | Freq: Every day | ORAL | Status: DC
  Administered 2024-03-22 – 2024-04-01 (×11): 40 mg via ORAL
  Filled 2024-03-22 (×12): qty 1

## 2024-03-22 MED ORDER — ENOXAPARIN SODIUM 30 MG/0.3ML IJ SOSY
30.0000 mg | PREFILLED_SYRINGE | INTRAMUSCULAR | Status: DC
  Administered 2024-03-22 – 2024-04-02 (×12): 30 mg via SUBCUTANEOUS
  Filled 2024-03-22 (×12): qty 0.3

## 2024-03-22 MED ORDER — ONDANSETRON HCL 4 MG/2ML IJ SOLN
4.0000 mg | Freq: Four times a day (QID) | INTRAMUSCULAR | Status: DC | PRN
  Administered 2024-03-23 – 2024-03-26 (×3): 4 mg via INTRAVENOUS
  Filled 2024-03-22 (×3): qty 2

## 2024-03-22 MED ORDER — SODIUM CHLORIDE 0.9 % IV SOLN: 500.0000 mg | Freq: Three times a day (TID) | INTRAVENOUS | Status: DC

## 2024-03-22 MED ORDER — BISACODYL 5 MG PO TBEC: 5.0000 mg | DELAYED_RELEASE_TABLET | Freq: Every day | ORAL | Status: DC | PRN

## 2024-03-22 MED ORDER — LACTATED RINGERS IV SOLN
150.0000 mL/h | INTRAVENOUS | Status: AC
  Administered 2024-03-22 – 2024-03-23 (×2): 150 mL/h via INTRAVENOUS

## 2024-03-22 MED ORDER — METOPROLOL TARTRATE 50 MG PO TABS
50.0000 mg | ORAL_TABLET | Freq: Two times a day (BID) | ORAL | Status: DC
  Administered 2024-03-22 – 2024-04-01 (×19): 50 mg via ORAL
  Filled 2024-03-22 (×23): qty 1

## 2024-03-22 MED ORDER — ACETAMINOPHEN 650 MG RE SUPP: 650.0000 mg | Freq: Four times a day (QID) | RECTAL | Status: DC | PRN

## 2024-03-22 MED ORDER — FLUTICASONE FUROATE-VILANTEROL 100-25 MCG/ACT IN AEPB: 1.0000 | INHALATION_SPRAY | Freq: Every day | RESPIRATORY_TRACT | Status: DC

## 2024-03-22 MED ORDER — ACETAMINOPHEN 500 MG PO TABS
1000.0000 mg | ORAL_TABLET | Freq: Once | ORAL | Status: AC
  Administered 2024-03-22: 1000 mg via ORAL
  Filled 2024-03-22: qty 2

## 2024-03-22 MED ORDER — BRIMONIDINE TARTRATE 0.15 % OP SOLN
1.0000 [drp] | Freq: Two times a day (BID) | OPHTHALMIC | Status: DC
  Administered 2024-03-22 – 2024-04-04 (×25): 1 [drp] via OPHTHALMIC
  Filled 2024-03-22 (×2): qty 5

## 2024-03-22 NOTE — Evaluation (Signed)
 Physical Therapy Evaluation Patient Details Name: Stephanie [name] MRN: [id number] DOB: [birth year] Today's Date: 03/22/2024  History of Present Illness  Stephanie [name] is an 83yoF who [facility] on [facility]22[facility]24 for acute onset weakness. Pt admitted with UTI. anemia, asthma, chronic kidney disease, type 2 diabetes mellitus, atrial fibrillation, GERD, glaucoma, hypertension, dyslipidemia, and vitamin B-12 deficiency, right hydronephrosis and right ureteral stent. At baseline pt is a household AMB with RW, requires intermittent minA for bed mobility and low surface transfers from husband.  Clinical Impression  Pt in bed on entry, awake, agreeable to evaluation, husband at bedside. Pt is flat of affect, but puts forth a reasonable effort, demonstrates mild weakness compared to baseline with bed mobility and transfers, however has far more than baseline weakness of legs when trying to weight shift and take steps while standing. Pt assisted back to bed, at end of session, HOB elevated. Husband provides primary care and physical assistance at baseline, however pt may be weaker than what he is able to provide at this point in time. Will continue to follow and advance mobility as pt is able.       If plan is discharge home, recommend the following: A lot of help with walking and/or transfers;Assistance with cooking/housework;Help with stairs or ramp for entrance;Assist for transportation;Direct supervision/assist for financial management;Direct supervision/assist for medications management;Supervision due to cognitive status   Can travel by private vehicle   Yes    Equipment Recommendations None recommended by PT  Recommendations for Other Services       Functional Status Assessment Patient has had a recent decline in their functional status and demonstrates the ability to make significant improvements in function in a reasonable and predictable amount of time.     Precautions / Restrictions  Precautions Precautions: Fall Recall of Precautions/Restrictions: Intact Restrictions Weight Bearing Restrictions Per Provider Order: No      Mobility  Bed Mobility Overal bed mobility: Needs Assistance Bed Mobility: Supine to Sit, Sit to Supine     Supine to sit: Min assist, HOB elevated Sit to supine: Min assist   General bed mobility comments: typical of baseline needs    Transfers Overall transfer level: Needs assistance Equipment used: Rolling walker (2 wheels) Transfers: Sit to/from Stand Sit to Stand: Min assist                Ambulation/Gait   Gait Distance (Feet): 0 Feet         Pre-gait activities: marching in place with RW, struggles  to lift RLE, appears generally unsteady with attempts, completes fewer than 10reps    Stairs            Wheelchair Mobility     Tilt Bed    Modified Rankin (Stroke Patients Only)       Balance                                             Pertinent Vitals/Pain Pain Assessment Pain Assessment: No/denies pain    Home Living Family/patient expects to be discharged to:: Private residence Living Arrangements: Spouse/significant other   Type of Home: House Home Access: Stairs to enter   [employer] of Steps: 3-4 partial steps c 1 rail (typically able to manage well with husband assist)   Home Layout: One level Home Equipment: Agricultural consultant (2 wheels);Rollator (4 wheels);Wheelchair - manual  Prior Function               Mobility Comments: household AMB with RW, has WC for mobility out of home. ADLs Comments: defer to OT     Extremity/Trunk Assessment                Communication        Cognition Arousal: Alert Behavior During Therapy: Flat affect   PT - Cognitive impairments: Difficult to assess Difficult to assess due to:  (withdrawn)                               Cueing       General Comments      Exercises      Assessment/Plan    PT Assessment Patient needs continued PT services  PT Problem List Decreased strength;Decreased range of motion;Decreased activity tolerance;Decreased mobility;Decreased balance;Decreased cognition;Decreased safety awareness;Decreased knowledge of precautions       PT Treatment Interventions DME instruction;Gait training;Stair training;Functional mobility training;Therapeutic activities;Therapeutic exercise;Balance training;Neuromuscular re-education;Patient/family education    PT Goals (Current goals can be found in the Care Plan section)  Acute Rehab PT Goals Patient Stated Goal: regain baseline strength for return to home PT Goal Formulation: With family Time For Goal Achievement: 04/05/24 Potential to Achieve Goals: Fair    Frequency Min 2X/week     Co-evaluation               AM-PAC PT 6 Clicks Mobility  Outcome Measure Help needed turning from your back to your side while in a flat bed without using bedrails?: A Lot Help needed moving from lying on your back to sitting on the side of a flat bed without using bedrails?: A Lot Help needed moving to and from a bed to a chair (including a wheelchair)?: A Lot Help needed standing up from a chair using your arms (e.g., wheelchair or bedside chair)?: A Lot Help needed to walk in hospital room?: A Lot Help needed climbing 3-5 steps with a railing? : A Lot 6 Click Score: 12    End of Session   Activity Tolerance: Patient tolerated treatment well;No increased pain Patient left: in bed;with call bell/phone within reach;with family/visitor present Nurse Communication: Mobility status PT Visit Diagnosis: Difficulty in walking, not elsewhere classified (R26.2);Other abnormalities of gait and mobility (R26.89);Muscle weakness (generalized) (M62.81)    Time: 8554-8497 PT Time Calculation (min) (ACUTE ONLY): 17 min   Charges:   PT Evaluation $PT Eval Low Complexity: 1 Low   PT General Charges $$  ACUTE PT VISIT: 1 Visit    4:07 PM, 03/22/24 Peggye JAYSON Linear, PT, DPT Physical Therapist - [facility]  [phone] (ASCOM)    Bubba Vanbenschoten C 03/22/2024, 4:04 PM

## 2024-03-22 NOTE — ED Notes (Signed)
 Date and time results received: 03/22/24 1005  (use smartphrase .now to insert current time)  Test: Lactic Acid Critical Value: 2.0  Name of Provider Notified: Dr. Clarine  Orders Received? Or Actions Taken?: see chart

## 2024-03-22 NOTE — ED Triage Notes (Signed)
 Pt from home via [facility] with reports of weakness and a fall this morning. Pt Denies pain at this time. Does report vomiting 1 time yesterday.

## 2024-03-22 NOTE — ED Provider Notes (Signed)
 [facility] Provider Note    Event Date/Time   First MD Initiated Contact with Patient 03/22/24 [phone]     (approximate)   History   Weakness  Pt from home via [facility] with reports of weakness and a fall this morning. Pt Denies pain at this time. Does report vomiting 1 time yesterday.    HPI Stephanie [name] is a 83 y.o. female PMH multiple medical comorbidities including CKD, diabetes, chronic urinary Foley catheter, hypertension, hyperlipidemia, rheumatoid arthritis, prior small bowel obstruction, paroxysmal A-fib not on anticoagulation presents for evaluation of generalized weakness, fall as well as 1-2 episodes of vomiting yesterday - History gathered from patient and husband at bedside.  Patient reportedly did have 1-2 episodes of vomiting yesterday, nonbloody/nonbilious.  No fever or abdominal pain. - Patient felt too weak to walk from the bed to their kitchen (about 20 feet) today, husband helped her, her legs reportedly gave out twice and he sat her in a chair.  No head strike, no LOC.  No recent cough, shortness of breath.  No focal weakness. 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  Also chronic urinary retention with Foley.     Physical Exam   Triage Vital Signs: BP (!) 193/87   Pulse 69   Temp 99.6 F (37.6 C) (Rectal)   Resp (!) 26   Wt 69.4 kg   SpO2 97%   BMI 24.69 kg/m     Most recent vital signs: Vitals:   03/22/24 1030 03/22/24 1113  BP: (!) 193/87   Pulse: 69   Resp: (!) 26   Temp:    SpO2: 97% 97%     General: Awake, no distress.  HEENT: Normocephalic, atraumatic CV:  Good peripheral perfusion. RRR, RP 2+ Resp:  Normal effort. CTAB Abd:  No distention. Nontender to deep palpation throughout. No CVAT.  GU: Foley draining very thick cloudy urine Other:  Notable edema to left  lower extremity, mild on the right --asymmetric.  Minimal overlying erythema left lower extremity.   ED Results / Procedures / Treatments   Labs (all labs ordered are listed, but only abnormal results are displayed) Labs Reviewed  COMPREHENSIVE METABOLIC PANEL WITH GFR - Abnormal; Notable for the following components:      Result Value   Sodium 134 (*)    CO2 20 (*)    Glucose, Bld 191 (*)    BUN 53 (*)    Creatinine, Ser 3.32 (*)    Calcium  8.4 (*)    Total Protein 5.5 (*)    Albumin  3.1 (*)    AST 127 (*)    ALT 87 (*)    GFR, Estimated 13 (*)    All other components within normal limits  CBC WITH DIFFERENTIAL/PLATELET - Abnormal; Notable for the following components:   WBC 18.3 (*)    RBC 3.17 (*)    Hemoglobin 9.3 (*)    HCT 30.8 (*)    Neutro Abs 14.4 (*)    Monocytes Absolute 2.6 (*)    Abs Immature Granulocytes 0.10 (*)    All other components within normal limits  URINALYSIS, COMPLETE (UACMP) WITH MICROSCOPIC - Abnormal; Notable for the following components:   Color, Urine YELLOW (*)    APPearance TURBID (*)    Protein, ur >=300 (*)    Leukocytes,Ua MODERATE (*)    Bacteria, UA  MANY (*)    All other components within normal limits  LACTIC ACID, PLASMA - Abnormal; Notable for the following components:   Lactic Acid, Venous 2.0 (*)    All other components within normal limits  TROPONIN I (HIGH SENSITIVITY) - Abnormal; Notable for the following components:   Troponin I (High Sensitivity) 19 (*)    All other components within normal limits  RESP PANEL BY RT-PCR (RSV, FLU A&B, COVID)  [id number]  CULTURE, BLOOD (ROUTINE X 2)  CULTURE, BLOOD (ROUTINE X 2)  LIPASE, BLOOD  LACTIC ACID, PLASMA  TROPONIN I (HIGH SENSITIVITY)     EKG  Ecg = sinus rhythm, rate 72, no gross ST elevation or depression, 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  Left axis deviation present.  Normal intervals.  No evidence of ischemia nor arrhythmia on my  interpretation.   RADIOLOGY Radiology interpreted by myself and radiology report reviewed.  No acute pathology.  Urinary stent remains in place, hydronephrosis improved from prior, no evidence of obstructive uropathy    PROCEDURES:  Critical Care performed: Yes, see critical care procedure note(s)  .Critical Care  Performed by: Clarine Ozell LABOR, MD Authorized by: Clarine Ozell LABOR, MD   Critical care provider statement:    Critical care time (minutes):  30   Critical care time was exclusive of:  Separately billable procedures and treating other patients   Critical care was necessary to treat or prevent imminent or life-threatening deterioration of the following conditions:  Sepsis   Critical care was time spent personally by me on the following activities:  Development of treatment plan with patient or surrogate, discussions with consultants, evaluation of patient's response to treatment, examination of patient, ordering and review of laboratory studies, ordering and review of radiographic studies, ordering and performing treatments and interventions, pulse oximetry, re-evaluation of patient's condition and review of old charts   I assumed direction of critical care for this patient from another provider in my specialty: no     Care discussed with: admitting provider      MEDICATIONS ORDERED IN ED: Medications  meropenem  (MERREM ) 500 mg in sodium chloride  0.9 % 100 mL IVPB (500 mg Intravenous New Bag/Given 03/22/24 1053)  sodium chloride  0.9 % bolus 500 mL (0 mLs Intravenous Stopped 03/22/24 1013)  ondansetron  (ZOFRAN ) injection 4 mg (4 mg Intravenous Given 03/22/24 0925)  acetaminophen  (TYLENOL ) tablet 1,000 mg (1,000 mg Oral Given 03/22/24 1021)  sodium chloride  0.9 % bolus 500 mL (500 mLs Intravenous New Bag/Given 03/22/24 1111)     IMPRESSION / MDM / ASSESSMENT AND PLAN / ED COURSE  I reviewed the triage vital signs and the nursing notes.                               DDX/MDM/AP: Differential diagnosis includes, but is not limited to, underlying infection causing generalized weakness--high suspicion for UTI, consider pneumonia.  Consider recurrent urinary obstruction.  Patient does feel warm to touch, suspect febrile.  Consider viral syndrome including influenza or COVID-19.  No focal weakness to suggest CVA.  Consider intra-abdominal pathology including appendicitis, diverticulitis, cholecystitis, SBO given vomiting yesterday.  With regard to left lower extremity edema, patient does tell me she feels this is usually larger than her right leg, unable to corroborate this on my chart review--will rule out DVT with ultrasound.  Also consider mild overlying cellulitis contributing.  Plan: - Labs - Tylenol  - IV fluid -  Exchange Foley - Chest x-ray - CT abdomen pelvis - DVT ultrasound left lower extremity - Anticipate admission  Patient's presentation is most consistent with acute presentation with potential threat to life or bodily function.  The patient is on the cardiac monitor to evaluate for evidence of arrhythmia and/or significant heart rate changes.  ED course below.  Workup notable for evidence of UTI, Foley exchanged prior to urine collection.  No underlying urinary obstruction present, stent remains patent.  Treated with meropenem  based on prior urine cultures.  Received a modified reduced fluid bolus of about 1000 cc given not meeting septic shock criteria and desired to avoid hypervolemia in this elderly patient.  Admitted to medicine service.  Clinical Course as of 03/22/24 1113  Sun Mar 22, 2024  0925 CMP with AKI on CKD.  Mild-moderate transaminitis. [MM]  0926 CBC with leukocytosis with left shift.  Stable anemia. [MM]  [phone] Troponin mildly elevated, downtrending from prior [MM]  0950 Viral swab neg [MM]  1008 Lactate elevated at 2.0, getting IVF [MM]  1008 UA c/f infxn [MM]  1010 Per chart review, recent urine cultures growing both E.  coli and Klebsiella pneumoniae  She had sensitivities to carbapenems and Zosyn  [MM]  1015 Given AKI on CKD with low renal function at baseline, will opt for meropenem .  Creatinine clearance calculated at 15 by myself, will start with 500 mg IV [MM]  1024 CXR: IMPRESSION: Left basilar subsegmental atelectasis.   [MM]  1032 CTAP: IMPRESSION: 1. No acute findings within the abdomen or pelvis. 2. Right-sided nephroureteral stent in place. Right-sided pelviectasis is identified. The right renal pelvis measures 2.2 cm on today's study versus 3.2 cm on the previous exam. No signs of high-grade obstructive uropathy. 3. Large volume of desiccated stool is noted within the distended rectum. There is gaseous distension of the left colon proximal to this level. Correlate for any clinical signs or symptoms of constipation or fecal impaction. 4. Small nodules within the left base are similar measuring up to 4 mm. 5.  Aortic Atherosclerosis (ICD10-I70.0).   [MM]  1033 Hospitalist consult order placed [MM]    Clinical Course User Index [MM] Clarine Ozell LABOR, MD     FINAL CLINICAL IMPRESSION(S) / ED DIAGNOSES   Final diagnoses:  Sepsis with acute renal failure without septic shock, due to unspecified organism, unspecified acute renal failure type (HCC)  Acute cystitis with hematuria  AKI (acute kidney injury) (HCC)     Rx / DC Orders   ED Discharge Orders     None        Note:  This document was prepared using Dragon voice recognition software and may include unintentional dictation errors.   Clarine Ozell LABOR, MD 03/22/24 1113

## 2024-03-22 NOTE — Consult Note (Signed)
 CODE SEPSIS - PHARMACY COMMUNICATION  **Broad Spectrum Antibiotics should be administered within 1 hour of Sepsis diagnosis**  Time Code Sepsis Called/Page Received: 1153  Antibiotics Ordered: Meropenem   Time of 1st antibiotic administration: 1053  Additional action taken by pharmacy: N/A  If necessary, Name of Provider/Nurse Contacted: N/A  Stephanie [name], PharmD Clinical Pharmacist  03/22/2024  12:03 PM

## 2024-03-22 NOTE — H&P (Signed)
 History and Physical    Patient: Stephanie [name] FMW:[id number] DOB: April 07, 1941 DOA: 03/22/2024 DOS: the patient was seen and examined on 03/22/2024 PCP: Cleotilde Oneil FALCON, MD  Patient coming from: Home - lives with husband; NOK: Husband, [phone]   Chief Complaint: Weakness  HPI: Stephanie [name] is a 83 y.o. female with medical history significant of stage 4 CKD with anemia, afib not on AC, chronic urinary retention with foley, remote colon CA, DM, HLD, HTN, RA, and SBO who presented on 6/22 with generalized weakness. She reports that she came in because they made me.  She was so weak that she couldn't get from the bed to the couch.  She hardly ate at all yesterday.  She has been complaining that I feel terrible.  No obvious urinary symptoms.  No fever.  She has been tired all the time with poor appetite for several weeks but it was clearly worse today.    ER Course:  Sepsis from UTI.  h/o ESBL UTI, chronic retention with foley, UPJ stent.  Generalized weakness, vomiting.  WBC 18, lactate 2.  Urine looks purulent, foley exchanged.  DVT US  negative.  CT with stent in place, improving hydro, no obstructive uropathy.  Started on carbapenem due to AKI on CKD.     Review of Systems: As mentioned in the history of present illness. All other systems reviewed and are negative. Past Medical History:  Diagnosis Date   Anemia    Aortic atherosclerosis (HCC)    Arthritis    Asthma    CKD (chronic kidney disease), stage IV (HCC)    Colon cancer (HCC) 1996   Complication of anesthesia    a.) PONV   Diabetes mellitus without complication (HCC)    Dyspnea    Dysrhythmia    Foley catheter in place    GERD (gastroesophageal reflux disease)    Glaucoma    Hydronephrosis of right kidney    Hyperlipidemia    Hyperparathyroidism due to renal insufficiency (HCC)    Hypertension    Lumbar disc disease    Pneumonia    PONV (postoperative nausea and vomiting)    RBBB (right bundle branch block)     Rheumatoid arthritis involving multiple sites with positive rheumatoid factor (HCC)    Small bowel obstruction (HCC)    Urinary retention    Uses walker    Vitamin B 12 deficiency    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    has, does not wear   Past Surgical History:  Procedure Laterality Date   ABDOMINAL HYSTERECTOMY     BACK SURGERY  2006   x5  screws and rods   BREAST BIOPSY Right 11/01/2021   rt br biopsy calcs ribbon clip/ benign 2nd site LIQ   BREAST BIOPSY Right 11/01/2021   rt br stereo calcs coil clip 1st site / benign UIQ   COLON SURGERY     COLONOSCOPY WITH PROPOFOL  N/A 05/22/2017   Procedure: COLONOSCOPY WITH PROPOFOL ;  Surgeon: Viktoria Lamar DASEN, MD;  Location: [facility] ENDOSCOPY;  Service: Endoscopy;  Laterality: N/A;   COLONOSCOPY WITH PROPOFOL  N/A 06/08/2021   Procedure: COLONOSCOPY WITH PROPOFOL ;  Surgeon: Jinny Carmine, MD;  Location: [facility] SURGERY [facility];  Service: Endoscopy;  Laterality: N/A;  Diabetic   CYSTOSCOPY W/ RETROGRADES Right 11/06/2022   Procedure: CYSTOSCOPY WITH RETROGRADE PYELOGRAM;  Surgeon: Twylla Glendia BROCKS, MD;  Location: [facility] ORS;  Service: Urology;  Laterality: Right;   CYSTOSCOPY W/ URETERAL STENT PLACEMENT  07/25/2021   Procedure:  CYSTOSCOPY WITH RETROGRADE PYELOGRAM/URETERAL STENT PLACEMENT;  Surgeon: Twylla Glendia BROCKS, MD;  Location: [facility] ORS;  Service: Urology;;   CYSTOSCOPY W/ URETERAL STENT PLACEMENT  02/06/2022   Procedure: CYSTOSCOPY WITH RETROGRADE PYELOGRAM/URETERAL STENT EXCHANGE;  Surgeon: Twylla Glendia BROCKS, MD;  Location: [facility] ORS;  Service: Urology;;   CYSTOSCOPY W/ URETERAL STENT PLACEMENT Right 11/06/2022   Procedure: CYSTOSCOPY WITH STENT EXCHANGE;  Surgeon: Twylla Glendia BROCKS, MD;  Location: [facility] ORS;  Service: Urology;  Laterality: Right;   CYSTOSCOPY W/ URETERAL STENT PLACEMENT Right 12/03/2023   Procedure: CYSTOSCOPY WITH STENT EXCHANGE;  Surgeon: Twylla Glendia BROCKS, MD;  Location: [facility] ORS;  Service: Urology;  Laterality: Right;   CYSTOSCOPY WITH  STENT PLACEMENT Right 08/02/2020   Procedure: CYSTOSCOPY WITH STENT EXCHANGE;  Surgeon: Twylla Glendia BROCKS, MD;  Location: [facility] ORS;  Service: Urology;  Laterality: Right;   CYSTOSCOPY WITH STENT PLACEMENT Right 06/18/2020   Procedure: CYSTOSCOPY WITH STENT PLACEMENT;  Surgeon: Watt Rush, MD;  Location: [facility] ORS;  Service: Urology;  Laterality: Right;   EYE SURGERY Bilateral    Cataracts   FRACTURE SURGERY Right 2011   foot surgery   JOINT REPLACEMENT Bilateral    knees   KNEE ARTHROSCOPY Bilateral    LAPAROTOMY N/A 06/30/2020   Procedure: EXPLORATORY LAPAROTOMY;  Surgeon: Jordis Laneta FALCON, MD;  Location: [facility] ORS;  Service: General;  Laterality: N/A;   LUMBAR LAMINECTOMY     POLYPECTOMY  06/08/2021   Procedure: POLYPECTOMY;  Surgeon: Jinny Carmine, MD;  Location: [facility] SURGERY [facility];  Service: Endoscopy;;   TEMPORARY DIALYSIS CATHETER N/A 06/27/2020   Procedure: TEMPORARY DIALYSIS CATHETER;  Surgeon: Jama Cordella MATSU, MD;  Location: [facility] INVASIVE CV LAB;  Service: Cardiovascular;  Laterality: N/A;   Social History:  reports that she has never smoked. She has never used smokeless tobacco. She reports that she does not drink alcohol and does not use drugs.  Allergies  Allergen Reactions   Remicade [Infliximab] Hives, Shortness Of Breath and Itching   Ibuprofen Itching   Indomethacin Other (See Comments)    headache   Methotrexate Derivatives Other (See Comments)    Headache   Moexipril Other (See Comments)    unknown   Percocet [Oxycodone -Acetaminophen ] Itching   Naprosyn [Naproxen] Rash    Family History  Problem Relation Age of Onset   Breast cancer Neg Hx     Prior to Admission medications   Medication Sig Start Date End Date Taking? Authorizing Provider  acetaminophen  (TYLENOL ) 325 MG tablet Take 2 tablets (650 mg total) by mouth every 6 (six) hours as needed for moderate pain, fever or headache. 07/10/20   Fausto Burnard LABOR, DO  albuterol  (VENTOLIN  HFA) 108 (90 Base)  MCG/ACT inhaler Inhale 1-2 puffs into the lungs every 6 (six) hours as needed for wheezing or shortness of breath.    [provider]  ascorbic acid  (VITAMIN C) 500 MG tablet Take 1 tablet (500 mg total) by mouth daily. 07/10/20   Fausto Burnard LABOR, DO  brimonidine  (ALPHAGAN ) 0.2 % ophthalmic solution Place 1 drop into both eyes 2 (two) times daily. 05/31/20   [provider]  CONTOUR TEST test strip 1 each by Other route daily.  03/09/18   [provider]  Cranberry 400 MG CAPS Take 400 mg by mouth daily.  03/24/20   [provider]  diltiazem  (CARDIZEM  CD) 240 MG 24 hr capsule Take 240 mg by mouth daily. 11/06/23   [provider]  fluticasone-salmeterol (ADVAIR) 100-50 MCG/ACT AEPB Inhale 1  puff into the lungs 2 (two) times daily.    [provider]  glipiZIDE  (GLUCOTROL ) 10 MG tablet Take 5 mg by mouth daily.    [provider]  hydrALAZINE  (APRESOLINE ) 25 MG tablet Take 50 mg by mouth in the morning, at noon, and at bedtime. 03/28/23 03/27/24  [provider]  Iron -Vitamin C (VITRON-C) 65-125 MG TABS Take 1 tablet by mouth daily.    [provider]  leflunomide  (ARAVA ) 10 MG tablet Take 10 mg by mouth daily.    [provider]  Magnesium  Oxide 250 MG TABS Take 250 mg by mouth 2 (two) times daily.    [provider]  metoCLOPramide (REGLAN) 5 MG tablet SMARTSIG:1 Tablet(s) By Mouth Morning-Night 01/31/24   [provider]  metoprolol  tartrate (LOPRESSOR ) 50 MG tablet Take 50 mg by mouth 2 (two) times daily.    [provider]  omeprazole (PRILOSEC) 40 MG capsule Take 40 mg by mouth daily. 09/11/20   [provider]  Probiotic Product (PROBIOTIC-10 PO) Take 1 capsule by mouth daily.    [provider]  rosuvastatin  (CRESTOR ) 10 MG tablet Take 10 mg by mouth daily.  03/24/20   [provider]  sulfaSALAzine (AZULFIDINE) 500 MG EC tablet Take 500 mg by mouth. 02/26/24  05/26/24  [provider]  tiZANidine  (ZANAFLEX ) 4 MG tablet Take 4 mg by mouth daily as needed for muscle spasms. Patient not taking: Reported on 03/03/2024    [provider]  torsemide (DEMADEX) 10 MG tablet Take 10 mg by mouth daily. One in morning and one in the evening 11/26/22   [provider]  triamcinolone  (KENALOG ) 0.025 % ointment Apply 1 application. topically 2 (two) times daily. 12/22/21   Helon Kirsch A, PA-C  vitamin B-12 (CYANOCOBALAMIN ) 1000 MCG tablet Take 1,000 mcg by mouth daily.    [provider]  VITAMIN D, CHOLECALCIFEROL, PO Take 1 capsule by mouth daily.    [provider]    Physical Exam: Vitals:   03/22/24 1000 03/22/24 1030 03/22/24 1100 03/22/24 1113  BP:  (!) 193/87 (!) 191/62   Pulse:  69 65   Resp:  (!) 26 (!) 21   Temp:      TempSrc:      SpO2:  97% 98% 97%  Weight: 69.4 kg      General:  Appears calm and comfortable and is in NAD Eyes:  PERRL, EOMI, normal lids, iris; fat pads infraorbitally are enlarged with an apparent cystic lesion adjacent to R fat pad ENT:  extremely hard of hearing, grossly normal lips & tongue, mmm; suboptimal dentition Neck:  no LAD, masses or thyromegaly Cardiovascular:  RRR. 2+ LLE compared to 1+ RLE edema.  Respiratory:   CTA bilaterally with no wheezes/rales/rhonchi.  Normal to mildly increased respiratory effort. Abdomen:  soft, NT, ND Skin:  no rash or induration seen on limited exam Musculoskeletal:  grossly normal tone BUE/BLE, good ROM, no bony abnormality Psychiatric:  blunted mood and affect, speech fluent and appropriate, AOx3 Neurologic:  CN 2-12 grossly intact, moves all extremities in coordinated fashion   Radiological Exams on Admission: Independently reviewed - see discussion in A/P where applicable  US  Venous Img Lower Unilateral Left (DVT) Result Date: 03/22/2024 CLINICAL DATA:  Left leg swelling EXAM: LEFT LOWER EXTREMITY VENOUS DOPPLER ULTRASOUND  TECHNIQUE: Gray-scale sonography with compression, as well as color and duplex ultrasound, were performed to evaluate the deep venous system(s) from the level of the common femoral vein  through the popliteal and proximal calf veins. COMPARISON:  None Available. FINDINGS: VENOUS Normal compressibility of the common femoral, superficial femoral, and popliteal veins, as well as the visualized calf veins. Visualized portions of profunda femoral vein and great saphenous vein unremarkable. No filling defects to suggest DVT on grayscale or color Doppler imaging. Doppler waveforms show normal direction of venous flow, normal respiratory plasticity and response to augmentation. Limited views of the contralateral common femoral vein are unremarkable. OTHER None. Limitations: none IMPRESSION: Negative. Electronically Signed   By: Waddell Calk M.D.   On: 03/22/2024 10:54   CT ABDOMEN PELVIS WO CONTRAST Result Date: 03/22/2024 CLINICAL DATA:  Chronic right UPJ stent. Sepsis. Evaluate for source of infection. EXAM: CT ABDOMEN AND PELVIS WITHOUT CONTRAST TECHNIQUE: Multidetector CT imaging of the abdomen and pelvis was performed following the standard protocol without IV contrast. RADIATION DOSE REDUCTION: This exam was performed according to the departmental dose-optimization program which includes automated exposure control, adjustment of the mA and/or kV according to patient size and/or use of iterative reconstruction technique. COMPARISON:  10/19/2022 FINDINGS: Lower chest: No pleural fluid or consolidative change. Small nodules within the left base are similar measuring up to 4 mm, image 10/4. Hepatobiliary: No focal liver abnormality. Gallbladder appears normal. Pancreas is suboptimally visualized. Pancreas: Atrophic appearance of the body and tail of pancreas as before. No signs of inflammation or mass. Spleen: Normal in size without focal abnormality. Adrenals/Urinary Tract: Normal adrenal glands. There is a  right-sided nephroureteral stent in place. Right-sided pelviectasis is identified. The right renal pelvis measures 2.2 cm on today's study versus 3.2 cm on the previous exam. No signs of high-grade obstructive uropathy. Left kidney is unremarkable. Urinary bladder is decompressed around a Foley catheter balloon. Distal portion of the right ureteral stent is in the decompressed bladder. Stomach/Bowel: Stomach appears within normal limits. No pathologic dilatation of the large or small bowel loops. The appendix is not confidently identified. No pericecal inflammation. There is a moderate stool burden identified within the ascending and proximal transverse colon. Large volume of desiccated stool is noted within the distended rectum. There is gaseous distension of the left colon proximal to this level. Vascular/Lymphatic: Aortic atherosclerosis without aneurysm. No signs of abdominopelvic adenopathy. Reproductive: Status post hysterectomy. No adnexal masses. Other: No free fluid or fluid collections. No signs of pneumoperitoneum. Musculoskeletal: Postoperative changes identified within the lower thoracic, lumbar spine and sacrum. On multilevel lumbar spondylosis noted. No acute osseous pathology. IMPRESSION: 1. No acute findings within the abdomen or pelvis. 2. Right-sided nephroureteral stent in place. Right-sided pelviectasis is identified. The right renal pelvis measures 2.2 cm on today's study versus 3.2 cm on the previous exam. No signs of high-grade obstructive uropathy. 3. Large volume of desiccated stool is noted within the distended rectum. There is gaseous distension of the left colon proximal to this level. Correlate for any clinical signs or symptoms of constipation or fecal impaction. 4. Small nodules within the left base are similar measuring up to 4 mm. 5.  Aortic Atherosclerosis (ICD10-I70.0). Electronically Signed   By: Waddell Calk M.D.   On: 03/22/2024 10:22   DG Chest 2 View Result Date:  03/22/2024 CLINICAL DATA:  Sepsis, unclear source EXAM: CHEST - 2 VIEW COMPARISON:  04/28/2022. FINDINGS: Cardiac silhouette is unremarkable. No pneumothorax or pleural effusion. Linear basilar subsegmental atelectasis on the left. Lungs otherwise clear. Aorta is calcified. Thoracic degenerative changes and thoracolumbar spine fixation hardware noted. IMPRESSION: Left basilar subsegmental atelectasis. Electronically Signed   By:  Fonda Field M.D.   On: 03/22/2024 10:13    EKG: Independently reviewed.  NSR with rate 72; RBBB and LAFB; nonspecific ST changes with no evidence of acute ischemia   Labs on Admission: I have personally reviewed the available labs and imaging studies at the time of the admission.  Pertinent labs:    Na++ 134 CO2 20 Glucose 191 BUN 53/Creatinine 3.32/GFR 13, stable Albumin  3.1 AST 127/ALT 87 HS troponin 19 Lactate 2 WBC 18.3 Hgb 9.3, stable UA: moderate LE, >300 protein, many bacteria Blood and urine cultures pending 11/2023 urine culture with ESBL E coli, sensitive to cephalosporins, Cipro , carbapenems, nitrofurantoin , tobramycin 11/2022 urine culture with Klebsiella, sensitive to Cipro , Imipenem, Zosyn    Assessment and Plan: Principal Problem:   Sepsis secondary to UTI [facility]) Active Problems:   DM type 2 with diabetic mixed hyperlipidemia (HCC)   Rheumatoid arthritis involving multiple sites with positive rheumatoid factor (HCC)   Pure hypercholesterolemia   CKD (chronic kidney disease) stage 5, GFR less than 15 ml/min (HCC)   UPJ obstruction, acquired   Type II diabetes mellitus with renal manifestations (HCC)   Anemia due to stage 4 chronic kidney disease (HCC)    Sepsis due to UTI SIRS criteria in this patient includes: Leukocytosis, tachypnea Patient has evidence of acute organ failure with elevated lactate of 2 that is not easily explained by another condition. While awaiting blood cultures, this appears to be a preseptic condition. Sepsis  protocol initiated Suspected source is complicated UTI (indwelling foley) Blood and urine cultures pending Will admit due to: h/o ESBL, complicated UTI Treat with IV Meropenem  for presumed ESBL urinary source  H/o urinary retention and hydronephrosis Foley exchanged in the ER Hydronephrosis appears to be improved from prior s/p stent Stent appears to be in good position on imaging  Stage 4-5 CKD GFR 14-16 throughout 2025 Appears to be stable at this time Attempt to avoid nephrotoxic medications Hold torsemide for now Recheck BMP in AM   Anemia of CKD Hgb stable at 9.3 No evidence of bleeding at this time  DM Last A1c was 7.2, reasonable control Hold glipizide , consider alternative therapy as an outpatient given its propensity for hypoglycemia in the elderly Cover with sensitive-scale SSI Carb modified diet   HLD Continue rosuvstatin  HTN Poor control in the ER Resume home diltiazem , hydralazine , and metoprolol   RA On leflunomide  Will hold in the setting of active infection  COPD  Continue Advair (Breo per formulary) Prn Albuterol   Glaucoma Continue brimonidine      Advance Care Planning:   Code Status: Full Code - Code status was discussed with the patient and/or family at the time of admission.  The patient would want to receive full resuscitative measures at this time.   Consults: PT/OT; TOC team  DVT Prophylaxis: Lovenox   Family Communication: Husband was present throughout evaluation  Severity of Illness: Admit - It is my clinical opinion that admission to INPATIENT is reasonable and necessary because of the expectation that this patient will require hospital care that crosses at least 2 midnights to treat this condition based on the medical complexity of the problems presented.  Given the aforementioned information, the predictability of an adverse outcome is felt to be significant.   Author: Delon Herald, MD 03/22/2024 11:55 AM  For on call review  [email or web address].

## 2024-03-22 NOTE — ED Notes (Signed)
 Coffee provided by this RN per request. Husband at bedside to help assist. No further needs expressed.

## 2024-03-22 NOTE — Progress Notes (Signed)
 PHARMACY NOTE:  ANTIMICROBIAL RENAL DOSAGE ADJUSTMENT  Current antimicrobial regimen includes a mismatch between antimicrobial dosage and estimated renal function.  As per policy approved by the Pharmacy & Therapeutics and Medical Executive Committees, the antimicrobial dosage will be adjusted accordingly.  Current antimicrobial dosage:  Meropenem  500mg  every 8 hours  Renal Function:  Estimated Creatinine Clearance: 12.4 mL/min (A) (by C-G formula based on SCr of 3.32 mg/dL (H)). []      On intermittent HD, scheduled: []      On CRRT    Antimicrobial dosage has been changed to:  Meropenem  500mg  every 12 hours  Additional comments:   Thank you for allowing pharmacy to be a part of this patient's care.  Stephanie [name], PharmD, BCPS Clinical Pharmacist 03/22/2024 10:19 AM

## 2024-03-23 DIAGNOSIS — A419 Sepsis, unspecified organism: Secondary | ICD-10-CM | POA: Diagnosis not present

## 2024-03-23 DIAGNOSIS — N390 Urinary tract infection, site not specified: Secondary | ICD-10-CM | POA: Diagnosis not present

## 2024-03-23 LAB — MRSA NEXT GEN BY PCR, NASAL: MRSA by PCR Next Gen: NOT DETECTED

## 2024-03-23 LAB — BLOOD CULTURE ID PANEL (REFLEXED) - BCID2

## 2024-03-23 LAB — BASIC METABOLIC PANEL WITH GFR
Anion gap: 6 (ref 5–15)
BUN: 49 mg/dL — ABNORMAL HIGH (ref 8–23)
CO2: 20 mmol/L — ABNORMAL LOW (ref 22–32)
Calcium: 8.5 mg/dL — ABNORMAL LOW (ref 8.9–10.3)
Chloride: 108 mmol/L (ref 98–111)
Creatinine, Ser: 2.86 mg/dL — ABNORMAL HIGH (ref 0.44–1.00)
GFR, Estimated: 16 mL/min — ABNORMAL LOW (ref >60)
Glucose, Bld: 100 mg/dL — ABNORMAL HIGH (ref 70–99)
Potassium: 4.7 mmol/L (ref 3.5–5.1)
Sodium: 134 mmol/L — ABNORMAL LOW (ref 135–145)

## 2024-03-23 LAB — GLUCOSE, CAPILLARY
Glucose-Capillary: 101 mg/dL — ABNORMAL HIGH (ref 70–99)
Glucose-Capillary: 131 mg/dL — ABNORMAL HIGH (ref 70–99)
Glucose-Capillary: 133 mg/dL — ABNORMAL HIGH (ref 70–99)
Glucose-Capillary: 109 mg/dL — ABNORMAL HIGH (ref 70–99)

## 2024-03-23 LAB — CBC
HCT: 27.2 % — ABNORMAL LOW (ref 36.0–46.0)
Hemoglobin: 8.1 g/dL — ABNORMAL LOW (ref 12.0–15.0)
MCH: 28.6 pg (ref 26.0–34.0)
MCHC: 29.8 g/dL — ABNORMAL LOW (ref 30.0–36.0)
MCV: 96.1 fL (ref 80.0–100.0)
Platelets: 166 10*3/uL (ref 150–400)
RBC: 2.83 MIL/uL — ABNORMAL LOW (ref 3.87–5.11)
RDW: 15.4 % (ref 11.5–15.5)
WBC: 11.1 10*3/uL — ABNORMAL HIGH (ref 4.0–10.5)
nRBC: 0 % (ref 0.0–0.2)

## 2024-03-23 MED ORDER — METOPROLOL TARTRATE 5 MG/5ML IV SOLN
5.0000 mg | Freq: Four times a day (QID) | INTRAVENOUS | Status: DC | PRN
  Administered 2024-03-23: 5 mg via INTRAVENOUS
  Filled 2024-03-23: qty 5

## 2024-03-23 MED ORDER — POLYETHYLENE GLYCOL 3350 17 G PO PACK
17.0000 g | PACK | Freq: Every day | ORAL | Status: DC
  Administered 2024-03-23 – 2024-04-01 (×9): 17 g via ORAL
  Filled 2024-03-23 (×10): qty 1

## 2024-03-23 MED ORDER — MINERAL OIL RE ENEM
1.0000 | ENEMA | Freq: Once | RECTAL | Status: AC
  Administered 2024-03-23: 1 via RECTAL

## 2024-03-23 NOTE — Consult Note (Signed)
 Infectious Disease     Reason for Consult: Urosepsis    Referring Physician: Dr Elpidio Date of Admission:  03/22/2024   Principal Problem:   Sepsis secondary to UTI [facility]) Active Problems:   DM type 2 with diabetic mixed hyperlipidemia (HCC)   Rheumatoid arthritis involving multiple sites with positive rheumatoid factor (HCC)   Pure hypercholesterolemia   CKD (chronic kidney disease) stage 5, GFR less than 15 ml/min (HCC)   UPJ obstruction, acquired   Type II diabetes mellitus with renal manifestations (HCC)   Anemia due to stage 4 chronic kidney disease (HCC)   HPI: Stephanie [name] is a 83 y.o. female with multiple medical problems including chronic kidney disease, anemia, A-fib, chronic urinary retention with a Foley cath presents with generalized weakness.  In the ED she was found to have no fever but a white count of 18.  Lactic acid was elevated at 2.  Creatinine was around her baseline 3.  Urine sample showed moderate leukocyte esterase greater than 50 whites urine cultures pending. She had a CT scan done that showed stent in place on the right side nephroureteral stent but no signs of high-grade obstructive uropathy.  There was desiccated stool large volume and distended rectum.  Chest x-ray was normal.  Lower extremity Doppler was negative. Since then blood cultures have returned positive with 1 of 2 growing gram-positive cocci identified as staph species not Staph aureus.    Most recently per my review of records and February she had E. coli which was relatively sensitive and was treated with Keflex . She follows with urology last saw them May 29 for foley  catheter exchange.  In March she underwent cystoscopy with right right urethral stent exchange without complication    Since admission she has been started on meropenem  given a prior history of ESBL Kleb on urine culture in 11/2022.Stephanie [name]   Past Medical History:  Diagnosis Date   Anemia    Aortic atherosclerosis (HCC)    Arthritis     Asthma    CKD (chronic kidney disease), stage IV (HCC)    Colon cancer (HCC) 1996   Complication of anesthesia    a.) PONV   Diabetes mellitus without complication (HCC)    Dyspnea    Dysrhythmia    Foley catheter in place    GERD (gastroesophageal reflux disease)    Glaucoma    Hydronephrosis of right kidney    Hyperlipidemia    Hyperparathyroidism due to renal insufficiency (HCC)    Hypertension    Lumbar disc disease    Pneumonia    PONV (postoperative nausea and vomiting)    RBBB (right bundle branch block)    Rheumatoid arthritis involving multiple sites with positive rheumatoid factor (HCC)    Small bowel obstruction (HCC)    Urinary retention    Uses walker    Vitamin B 12 deficiency    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    has, does not wear   Past Surgical History:  Procedure Laterality Date   ABDOMINAL HYSTERECTOMY     BACK SURGERY  2006   x5  screws and rods   BREAST BIOPSY Right 11/01/2021   rt br biopsy calcs ribbon clip/ benign 2nd site LIQ   BREAST BIOPSY Right 11/01/2021   rt br stereo calcs coil clip 1st site / benign UIQ   COLON SURGERY     COLONOSCOPY WITH PROPOFOL  N/A 05/22/2017   Procedure: COLONOSCOPY WITH PROPOFOL ;  Surgeon: Stephanie Lamar DASEN, MD;  Location: [facility] END[facility];  Service: Endoscopy;  Laterality: N/A;   COLONOSCOPY WITH PROPOFOL  N/A 06/08/2021   Procedure: COLONOSCOPY WITH PROPOFOL ;  Surgeon: Stephanie Carmine, MD;  Location: [facility];  Service: Endoscopy;  Laterality: N/A;  Diabetic   CYSTOSCOPY W/ RETROGRADES Right 11/06/2022   Procedure: CYSTOSCOPY WITH RETROGRADE PYELOGRAM;  Surgeon: Stephanie Glendia BROCKS, MD;  Location: [facility] ORS;  Service: Urology;  Laterality: Right;   CYSTOSCOPY W/ URETERAL STENT PLACEMENT  07/25/2021   Procedure: CYSTOSCOPY WITH RETROGRADE PYELOGRAM/URETERAL STENT PLACEMENT;  Surgeon: Stephanie Glendia BROCKS, MD;  Location: [facility] ORS;  Service: Urology;;   CYSTOSCOPY W/ URETERAL STENT PLACEMENT  02/06/2022   Procedure:  CYSTOSCOPY WITH RETROGRADE PYELOGRAM/URETERAL STENT EXCHANGE;  Surgeon: Stephanie Glendia BROCKS, MD;  Location: [facility] OR[facility];  Service: Urology;;   CYSTOSCOPY W/ URETERAL STENT PLACEMENT Right 11/06/2022   Procedure: CYSTOSCOPY WITH STENT EXCHANGE;  Surgeon: Stephanie Glendia BROCKS, MD;  Location: [facility] OR[facility];  Service: Urology;  Laterality: Right;   CYSTOSCOPY W/ URETERAL STENT PLACEMENT Right 12/03/2023   Procedure: CYSTOSCOPY WITH STENT EXCHANGE;  Surgeon: Stephanie Glendia BROCKS, MD;  Location: [facility];  Service: Urology;  Laterality: Right;   CYSTOSCOPY WITH STENT PLACEMENT Right 08/02/2020   Procedure: CYSTOSCOPY WITH STENT EXCHANGE;  Surgeon: Stephanie Glendia BROCKS, MD;  Location: [facility];  Service: Urology;  Laterality: Right;   CYSTOSCOPY WITH STENT PLACEMENT Right 06/18/2020   Procedure: CYSTOSCOPY WITH STENT PLACEMENT;  Surgeon: Stephanie Rush, MD;  Location: [facility];  Service: Urology;  Laterality: Right;   EYE SURGERY Bilateral    Cataracts   FRACTURE SURGERY Right 2011   foot surgery   JOINT REPLACEMENT Bilateral    knees   KNEE ARTHROSCOPY Bilateral    LAPAROTOMY N/A 06/30/2020   Procedure: EXPLORATORY LAPAROTOMY;  Surgeon: Stephanie Laneta FALCON, MD;  Location: [facility];  Service: General;  Laterality: N/A;   LUMBAR LAMINECTOMY     POLYPECTOMY  06/08/2021   Procedure: POLYPECTOMY;  Surgeon: Stephanie Carmine, MD;  Location: [facility];  Service: Endoscopy;;   TEMPORARY DIALYSIS CATHETER N/A 06/27/2020   Procedure: TEMPORARY DIALYSIS CATHETER;  Surgeon: Stephanie Cordella MATSU, MD;  Location: [facility];  Service: Cardiovascular;  Laterality: N/A;   Social History   Tobacco Use   Smoking status: Never   Smokeless tobacco: Never  Vaping Use   Vaping status: Never Used  Substance Use Topics   Alcohol use: No   Drug use: No   Family History  Problem Relation Age of Onset   Breast cancer Neg Hx     Allergies:  Allergies  Allergen Reactions   Remicade [Infliximab] Hives, Shortness Of Breath and  Itching   Ibuprofen Itching   Indomethacin Other (See Comments)    headache   Methotrexate Derivatives Other (See Comments)    Headache   Moexipril Other (See Comments)    unknown   Percocet [Oxycodone -Acetaminophen ] Itching   Naprosyn [Naproxen] Rash    Current antibiotics: Antibiotics Given (last 72 hours)     Date/Time Action Medication Dose Rate   03/22/24 1053 New Bag/Given   meropenem  (MERREM ) 500 mg in sodium chloride  0.9 % 100 mL IVPB 500 mg 200 mL/hr   03/22/24 2210 New Bag/Given   meropenem  (MERREM ) 500 mg in sodium chloride  0.9 % 100 mL IVPB 500 mg 200 mL/hr   03/23/24 0924 New Bag/Given   meropenem  (MERREM ) 500 mg in sodium chloride  0.9 % 100 mL IVPB 500 mg 200 mL/hr       MEDICATIONS:  brimonidine   1 drop Both Eyes BID   diltiazem   240 mg Oral Daily   docusate sodium  100 mg Oral BID   enoxaparin  (LOVENOX ) injection  30 mg Subcutaneous Q24H   hydrALAZINE   50 mg Oral Q8H   insulin  aspart  0-9 Units Subcutaneous TID WC   metoCLOPramide  5 mg Oral BID   metoprolol  tartrate  50 mg Oral BID   pantoprazole   40 mg Oral Daily   rosuvastatin   10 mg Oral Daily   sodium chloride  flush  3 mL Intravenous Q12H    Review of Systems - 11 systems reviewed and negative per HPI   OBJECTIVE: Temp:  [98 F (36.7 C)-98.9 F (37.2 C)] 98.8 F (37.1 C) (06/23 0734) Pulse Rate:  [59-74] 74 (06/23 0734) Resp:  [17-26] 17 (06/23 0734) BP: (151-199)/(57-87) 151/57 (06/23 0734) SpO2:  [93 %-98 %] 93 % (06/23 0734) Weight:  [69.4 kg] 69.4 kg (06/22 1414) Physical Exam  Constitutional: frail, lethargic HENT: [place]/AT, PERRLA, no scleral icterus Mouth/Throat: Oropharynx is clear and dry . No oropharyngeal exudate.  Cardiovascular: Normal rate, regular rhythm and normal heart sounds.  Pulmonary/Chest: Effort normal and breath sounds normal. No respiratory distress.  has no wheezes.  Neck = supple, no nuchal rigidity Abdominal: Soft. Bowel sounds are normal.  exhibits no  distension. There is no tenderness.  Foley cath in place with slightly cloudy urine.  Lymphadenopathy: no cervical adenopathy. No axillary adenopathy Neurological: alert and oriented to person, place, and time.  Skin: Skin is warm and dry. No rash noted. No erythema.  Psychiatric: a normal mood and affect.  behavior is normal.    LABS: Results for orders placed or performed during the hospital encounter of 03/22/24 (from the past 48 hours)  Resp panel by RT-PCR (RSV, Flu A&B, Covid) Anterior Nasal Swab     Status: None   Collection Time: 03/22/24  8:50 AM   Specimen: Anterior Nasal Swab  Result Value Ref Range   SARS Coronavirus 2 by RT PCR NEGATIVE NEGATIVE    Comment: (NOTE) SARS-CoV-2 target nucleic acids are NOT DETECTED.  The SARS-CoV-2 RNA is generally detectable in upper respiratory specimens during the acute phase of infection. The lowest concentration of SARS-CoV-2 viral copies this assay can detect is 138 copies/mL. A negative result does not preclude SARS-Cov-2 infection and should not be used as the sole basis for treatment or other patient management decisions. A negative result may occur with  improper specimen collection/handling, submission of specimen other than nasopharyngeal swab, presence of viral mutation(s) within the areas targeted by this assay, and inadequate number of viral copies(<138 copies/mL). A negative result must be combined with clinical observations, patient history, and epidemiological information. The expected result is Negative.  Fact Sheet for Patients:  BloggerCourse.com  Fact Sheet for Healthcare Providers:  SeriousBroker.it  This test is no t yet approved or cleared by the [place]  FDA and  has been authorized for detection and/or diagnosis of SARS-CoV-2 by FDA under an Emergency Use Authorization (EUA). This EUA will remain  in effect (meaning this test can be used) for the duration  of the COVID-19 declaration under Section 564(b)(1) of the Act, 21 U.S.C.section 360bbb-3(b)(1), unless the authorization is terminated  or revoked sooner.       Influenza A by PCR NEGATIVE NEGATIVE   Influenza B by PCR NEGATIVE NEGATIVE    Comment: (NOTE) The Xpert Xpress SARS-CoV-2/FLU/RSV plus assay is intended as an aid in the diagnosis of influenza from Nasopharyngeal swab specimens  and should not be used as a sole basis for treatment. Nasal washings and aspirates are unacceptable for Xpert Xpress SARS-CoV-2/FLU/RSV testing.  Fact Sheet for Patients: BloggerCourse.com  Fact Sheet for Healthcare Providers: SeriousBroker.it  This test is not yet approved or cleared by the [place]  FDA and has been authorized for detection and/or diagnosis of SARS-CoV-2 by FDA under an Emergency Use Authorization (EUA). This EUA will remain in effect (meaning this test can be used) for the duration of the COVID-19 declaration under Section 564(b)(1) of the Act, 21 U.S.C. section 360bbb-3(b)(1), unless the authorization is terminated or revoked.     Resp Syncytial Virus by PCR NEGATIVE NEGATIVE    Comment: (NOTE) Fact Sheet for Patients: BloggerCourse.com  Fact Sheet for Healthcare Providers: SeriousBroker.it  This test is not yet approved or cleared by the [place]  FDA and has been authorized for detection and/or diagnosis of SARS-CoV-2 by FDA under an Emergency Use Authorization (EUA). This EUA will remain in effect (meaning this test can be used) for the duration of the COVID-19 declaration under Section 564(b)(1) of the Act, 21 U.S.C. section 360bbb-3(b)(1), unless the authorization is terminated or revoked.  Performed at [facility], [address] [place], [place] [address]   Comprehensive metabolic panel     Status: Abnormal   Collection Time: 03/22/24   8:50 AM  Result Value Ref Range   Sodium 134 (L) 135 - 145 mmol/L   Potassium 5.1 3.5 - 5.1 mmol/L   Chloride 105 98 - 111 mmol/L   CO2 20 (L) 22 - 32 mmol/L   Glucose, Bld 191 (H) 70 - 99 mg/dL    Comment: Glucose reference range applies only to samples taken after fasting for at least 8 hours.   BUN 53 (H) 8 - 23 mg/dL   Creatinine, Ser 6.67 (H) 0.44 - 1.00 mg/dL   Calcium  8.4 (L) 8.9 - 10.3 mg/dL   Total Protein 5.5 (L) 6.5 - 8.1 g/dL   Albumin  3.1 (L) 3.5 - 5.0 g/dL   AST 872 (H) 15 - 41 U/L   ALT 87 (H) 0 - 44 U/L   Alkaline Phosphatase 108 38 - 126 U/L   Total Bilirubin 0.8 0.0 - 1.2 mg/dL   GFR, Estimated 13 (L) >60 mL/min    Comment: (NOTE) Calculated using the CKD-EPI Creatinine Equation (2021)    Anion gap 9 5 - 15    Comment: Performed at [facility], [address] [place], [place] [address]  Lipase, blood     Status: None   Collection Time: 03/22/24  8:50 AM  Result Value Ref Range   Lipase 28 11 - 51 U/L    Comment: Performed at [facility], [address] [place], [place] [address]  Troponin I (High Sensitivity)     Status: Abnormal   Collection Time: 03/22/24  8:50 AM  Result Value Ref Range   Troponin I (High Sensitivity) 19 (H) <18 ng/L    Comment: (NOTE) Elevated high sensitivity troponin I (hsTnI) values and significant  changes across serial measurements may suggest ACS but many other  chronic and acute conditions are known to elevate hsTnI results.  Refer to the Links section for chest pain algorithms and additional  guidance. Performed at [facility], [address] [place], [place] [address]   CBC with Differential     Status: Abnormal   Collection Time: 03/22/24  8:50 AM  Result Value Ref Range   WBC 18.3 (H) 4.0 -  10.5 K/uL   RBC 3.17 (L) 3.87 - 5.11 MIL/uL   Hemoglobin 9.3 (L) 12.0 - 15.0 g/dL   HCT 69.1 (L) 63.9 - 53.9 %   MCV 97.2 80.0 - 100.0 fL   MCH 29.3 26.0 - 34.0 pg   MCHC 30.2 30.0 - 36.0 g/dL    RDW 84.6 88.4 - 84.4 %   Platelets 208 150 - 400 K/uL   nRBC 0.0 0.0 - 0.2 %   Neutrophils Relative % 79 %   Neutro Abs 14.4 (H) 1.7 - 7.7 K/uL   Lymphocytes Relative 6 %   Lymphs Abs 1.2 0.7 - 4.0 K/uL   Monocytes Relative 14 %   Monocytes Absolute 2.6 (H) 0.1 - 1.0 K/uL   Eosinophils Relative 0 %   Eosinophils Absolute 0.0 0.0 - 0.5 K/uL   Basophils Relative 0 %   Basophils Absolute 0.1 0.0 - 0.1 K/uL   Immature Granulocytes 1 %   Abs Immature Granulocytes 0.10 (H) 0.00 - 0.07 K/uL    Comment: Performed at [facility], [address] [place], [place] [address]  Urinalysis, Complete w Microscopic -Urine, Unspecified Source     Status: Abnormal   Collection Time: 03/22/24  8:50 AM  Result Value Ref Range   Color, Urine YELLOW (A) YELLOW   APPearance TURBID (A) CLEAR   Specific Gravity, Urine 1.016 1.005 - 1.030   pH 5.0 5.0 - 8.0   Glucose, UA NEGATIVE NEGATIVE mg/dL   Hgb urine dipstick NEGATIVE NEGATIVE   Bilirubin Urine NEGATIVE NEGATIVE   Ketones, ur NEGATIVE NEGATIVE mg/dL   Protein, ur >=699 (A) NEGATIVE mg/dL   Nitrite NEGATIVE NEGATIVE   Leukocytes,Ua MODERATE (A) NEGATIVE   RBC / HPF 21-50 0 - 5 RBC/hpf   WBC, UA >50 0 - 5 WBC/hpf   Bacteria, UA MANY (A) NONE SEEN   Squamous Epithelial / HPF 6-10 0 - 5 /HPF   WBC Clumps PRESENT    Mucus PRESENT     Comment: Performed at [facility], [address] [place], [place] [address]  Blood culture (routine x 2)     Status: None (Preliminary result)   Collection Time: 03/22/24  9:38 AM   Specimen: BLOOD  Result Value Ref Range   Specimen Description BLOOD LEFT ANTECUBITAL    Special Requests      BOTTLES DRAWN AEROBIC AND ANAEROBIC Blood Culture results may not be optimal due to an inadequate volume of blood received in culture bottles   Culture  Setup Time      GRAM POSITIVE COCCI AEROBIC BOTTLE ONLY Organism ID to follow CRITICAL RESULT CALLED TO, READ BACK BY AND VERIFIED WITH: JASON  ROBBINS 03/23/24 0445 MW    Culture      NO GROWTH < 24 HOURS Performed at [facility], [address] [place], [place] [address]    Report Status PENDING   Blood culture (routine x 2)     Status: None (Preliminary result)   Collection Time: 03/22/24  9:38 AM   Specimen: BLOOD  Result Value Ref Range   Specimen Description BLOOD BLOOD LEFT ARM    Special Requests      BOTTLES DRAWN AEROBIC AND ANAEROBIC Blood Culture adequate volume   Culture      NO GROWTH < 24 HOURS Performed at [facility], [address] [place], [place] [address]    Report Status PENDING   Lactic acid, plasma     Status: Abnormal   Collection  Time: 03/22/24  9:38 AM  Result Value Ref Range   Lactic Acid, Venous 2.0 (HH) 0.5 - 1.9 mmol/L    Comment: CRITICAL RESULT CALLED TO, READ BACK BY AND VERIFIED WITH MICHAEL DOSS AT 1001 03/22/24.PMF Performed at [facility], [address] [place], [place] [address]   Blood Culture ID Panel (Reflexed)     Status: Abnormal   Collection Time: 03/22/24  9:38 AM  Result Value Ref Range   Enterococcus faecalis NOT DETECTED NOT DETECTED   Enterococcus Faecium NOT DETECTED NOT DETECTED   Listeria monocytogenes NOT DETECTED NOT DETECTED   Staphylococcus species DETECTED (A) NOT DETECTED    Comment: CRITICAL RESULT CALLED TO, READ BACK BY AND VERIFIED WITH: JASON ROBBINS 03/23/24 0445 MW    Staphylococcus aureus (BCID) NOT DETECTED NOT DETECTED   Staphylococcus epidermidis NOT DETECTED NOT DETECTED   Staphylococcus lugdunensis NOT DETECTED NOT DETECTED   Streptococcus species NOT DETECTED NOT DETECTED   Streptococcus agalactiae NOT DETECTED NOT DETECTED   Streptococcus pneumoniae NOT DETECTED NOT DETECTED   Streptococcus pyogenes NOT DETECTED NOT DETECTED   A.calcoaceticus-baumannii NOT DETECTED NOT DETECTED   Bacteroides fragilis NOT DETECTED NOT DETECTED   Enterobacterales NOT DETECTED NOT DETECTED   Enterobacter cloacae complex NOT  DETECTED NOT DETECTED   Escherichia coli NOT DETECTED NOT DETECTED   Klebsiella aerogenes NOT DETECTED NOT DETECTED   Klebsiella oxytoca NOT DETECTED NOT DETECTED   Klebsiella pneumoniae NOT DETECTED NOT DETECTED   Proteus species NOT DETECTED NOT DETECTED   Salmonella species NOT DETECTED NOT DETECTED   Serratia marcescens NOT DETECTED NOT DETECTED   Haemophilus influenzae NOT DETECTED NOT DETECTED   Neisseria meningitidis NOT DETECTED NOT DETECTED   Pseudomonas aeruginosa NOT DETECTED NOT DETECTED   Stenotrophomonas maltophilia NOT DETECTED NOT DETECTED   Candida albicans NOT DETECTED NOT DETECTED   Candida auris NOT DETECTED NOT DETECTED   Candida glabrata NOT DETECTED NOT DETECTED   Candida krusei NOT DETECTED NOT DETECTED   Candida parapsilosis NOT DETECTED NOT DETECTED   Candida tropicalis NOT DETECTED NOT DETECTED   Cryptococcus neoformans/gattii NOT DETECTED NOT DETECTED    Comment: Performed at [facility], [address] [place], [place] [address]  Lactic acid, plasma     Status: None   Collection Time: 03/22/24 10:51 AM  Result Value Ref Range   Lactic Acid, Venous 1.4 0.5 - 1.9 mmol/L    Comment: Performed at [facility], [address] [place], [place] [address]  Troponin I (High Sensitivity)     Status: Abnormal   Collection Time: 03/22/24 10:51 AM  Result Value Ref Range   Troponin I (High Sensitivity) 20 (H) <18 ng/L    Comment: (NOTE) Elevated high sensitivity troponin I (hsTnI) values and significant  changes across serial measurements may suggest ACS but many other  chronic and acute conditions are known to elevate hsTnI results.  Refer to the Links section for chest pain algorithms and additional  guidance. Performed at [facility], [address] [place], [place] [address]   CBG monitoring, ED     Status: Abnormal   Collection Time: 03/22/24 12:15 PM  Result Value Ref Range   Glucose-Capillary 164 (H) 70 - 99 mg/dL     Comment: Glucose reference range applies only to samples taken after fasting for at least 8 hours.  Glucose, capillary     Status: Abnormal   Collection Time: 03/22/24  4:57 PM  Result Value Ref Range   Glucose-Capillary 149 (  H) 70 - 99 mg/dL    Comment: Glucose reference range applies only to samples taken after fasting for at least 8 hours.   Comment 1 Notify RN    Comment 2 Document in Chart   Glucose, capillary     Status: Abnormal   Collection Time: 03/22/24 11:48 PM  Result Value Ref Range   Glucose-Capillary 120 (H) 70 - 99 mg/dL    Comment: Glucose reference range applies only to samples taken after fasting for at least 8 hours.  Basic metabolic panel     Status: Abnormal   Collection Time: 03/23/24  3:29 AM  Result Value Ref Range   Sodium 134 (L) 135 - 145 mmol/L   Potassium 4.7 3.5 - 5.1 mmol/L   Chloride 108 98 - 111 mmol/L   CO2 20 (L) 22 - 32 mmol/L   Glucose, Bld 100 (H) 70 - 99 mg/dL    Comment: Glucose reference range applies only to samples taken after fasting for at least 8 hours.   BUN 49 (H) 8 - 23 mg/dL   Creatinine, Ser 7.13 (H) 0.44 - 1.00 mg/dL   Calcium  8.5 (L) 8.9 - 10.3 mg/dL   GFR, Estimated 16 (L) >60 mL/min    Comment: (NOTE) Calculated using the CKD-EPI Creatinine Equation (2021)    Anion gap 6 5 - 15    Comment: Performed at [facility], [address] [place], [place] [address]  CBC     Status: Abnormal   Collection Time: 03/23/24  3:29 AM  Result Value Ref Range   WBC 11.1 (H) 4.0 - 10.5 K/uL   RBC 2.83 (L) 3.87 - 5.11 MIL/uL   Hemoglobin 8.1 (L) 12.0 - 15.0 g/dL   HCT 72.7 (L) 63.9 - 53.9 %   MCV 96.1 80.0 - 100.0 fL   MCH 28.6 26.0 - 34.0 pg   MCHC 29.8 (L) 30.0 - 36.0 g/dL   RDW 84.5 88.4 - 84.4 %   Platelets 166 150 - 400 K/uL   nRBC 0.0 0.0 - 0.2 %    Comment: Performed at [facility], [address] [place], [place] [address]  Glucose, capillary     Status: Abnormal   Collection Time: 03/23/24  7:28 AM   Result Value Ref Range   Glucose-Capillary 101 (H) 70 - 99 mg/dL    Comment: Glucose reference range applies only to samples taken after fasting for at least 8 hours.   No components found for: ESR, C REACTIVE PROTEIN MICRO: Recent Results (from the past 720 hours)  Resp panel by RT-PCR (RSV, Flu A&B, Covid) Anterior Nasal Swab     Status: None   Collection Time: 03/22/24  8:50 AM   Specimen: Anterior Nasal Swab  Result Value Ref Range Status   SARS Coronavirus 2 by RT PCR NEGATIVE NEGATIVE Final    Comment: (NOTE) SARS-CoV-2 target nucleic acids are NOT DETECTED.  The SARS-CoV-2 RNA is generally detectable in upper respiratory specimens during the acute phase of infection. The lowest concentration of SARS-CoV-2 viral copies this assay can detect is 138 copies/mL. A negative result does not preclude SARS-Cov-2 infection and should not be used as the sole basis for treatment or other patient management decisions. A negative result may occur with  improper specimen collection/handling, submission of specimen other than nasopharyngeal swab, presence of viral mutation(s) within the areas targeted by this assay, and inadequate number of viral copies(<138 copies/mL). A negative result must be combined with clinical observations, patient history,  and epidemiological information. The expected result is Negative.  Fact Sheet for Patients:  BloggerCourse.com  Fact Sheet for Healthcare Providers:  SeriousBroker.it  This test is no t yet approved or cleared by the [place]  FDA and  has been authorized for detection and/or diagnosis of SARS-CoV-2 by FDA under an Emergency Use Authorization (EUA). This EUA will remain  in effect (meaning this test can be used) for the duration of the COVID-19 declaration under Section 564(b)(1) of the Act, 21 U.S.C.section 360bbb-3(b)(1), unless the authorization is terminated  or revoked sooner.        Influenza A by PCR NEGATIVE NEGATIVE Final   Influenza B by PCR NEGATIVE NEGATIVE Final    Comment: (NOTE) The Xpert Xpress SARS-CoV-2/FLU/RSV plus assay is intended as an aid in the diagnosis of influenza from Nasopharyngeal swab specimens and should not be used as a sole basis for treatment. Nasal washings and aspirates are unacceptable for Xpert Xpress SARS-CoV-2/FLU/RSV testing.  Fact Sheet for Patients: BloggerCourse.com  Fact Sheet for Healthcare Providers: SeriousBroker.it  This test is not yet approved or cleared by the [place]  FDA and has been authorized for detection and/or diagnosis of SARS-CoV-2 by FDA under an Emergency Use Authorization (EUA). This EUA will remain in effect (meaning this test can be used) for the duration of the COVID-19 declaration under Section 564(b)(1) of the Act, 21 U.S.C. section 360bbb-3(b)(1), unless the authorization is terminated or revoked.     Resp Syncytial Virus by PCR NEGATIVE NEGATIVE Final    Comment: (NOTE) Fact Sheet for Patients: BloggerCourse.com  Fact Sheet for Healthcare Providers: SeriousBroker.it  This test is not yet approved or cleared by the [place]  FDA and has been authorized for detection and/or diagnosis of SARS-CoV-2 by FDA under an Emergency Use Authorization (EUA). This EUA will remain in effect (meaning this test can be used) for the duration of the COVID-19 declaration under Section 564(b)(1) of the Act, 21 U.S.C. section 360bbb-3(b)(1), unless the authorization is terminated or revoked.  Performed at [facility], [address] [place], [place] [address]   Blood culture (routine x 2)     Status: None (Preliminary result)   Collection Time: 03/22/24  9:38 AM   Specimen: BLOOD  Result Value Ref Range Status   Specimen Description BLOOD LEFT ANTECUBITAL  Final   Special  Requests   Final    BOTTLES DRAWN AEROBIC AND ANAEROBIC Blood Culture results may not be optimal due to an inadequate volume of blood received in culture bottles   Culture  Setup Time   Final    GRAM POSITIVE COCCI AEROBIC BOTTLE ONLY Organism ID to follow CRITICAL RESULT CALLED TO, READ BACK BY AND VERIFIED WITH: JASON ROBBINS 03/23/24 0445 MW    Culture   Final    NO GROWTH < 24 HOURS Performed at [facility], [address] [place], [place] [address]    Report Status PENDING  Incomplete  Blood culture (routine x 2)     Status: None (Preliminary result)   Collection Time: 03/22/24  9:38 AM   Specimen: BLOOD  Result Value Ref Range Status   Specimen Description BLOOD BLOOD LEFT ARM  Final   Special Requests   Final    BOTTLES DRAWN AEROBIC AND ANAEROBIC Blood Culture adequate volume   Culture   Final    NO GROWTH < 24 HOURS Performed at [facility], [address], [place], [place] [address]    Report Status PENDING  Incomplete  Blood Culture ID Panel (Reflexed)     Status: Abnormal   Collection Time: 03/22/24  9:38 AM  Result Value Ref Range Status   Enterococcus faecalis NOT DETECTED NOT DETECTED Final   Enterococcus Faecium NOT DETECTED NOT DETECTED Final   Listeria monocytogenes NOT DETECTED NOT DETECTED Final   Staphylococcus species DETECTED (A) NOT DETECTED Final    Comment: CRITICAL RESULT CALLED TO, READ BACK BY AND VERIFIED WITH: JASON ROBBINS 03/23/24 0445 MW    Staphylococcus aureus (BCID) NOT DETECTED NOT DETECTED Final   Staphylococcus epidermidis NOT DETECTED NOT DETECTED Final   Staphylococcus lugdunensis NOT DETECTED NOT DETECTED Final   Streptococcus species NOT DETECTED NOT DETECTED Final   Streptococcus agalactiae NOT DETECTED NOT DETECTED Final   Streptococcus pneumoniae NOT DETECTED NOT DETECTED Final   Streptococcus pyogenes NOT DETECTED NOT DETECTED Final   A.calcoaceticus-baumannii NOT DETECTED NOT DETECTED Final   Bacteroides  fragilis NOT DETECTED NOT DETECTED Final   Enterobacterales NOT DETECTED NOT DETECTED Final   Enterobacter cloacae complex NOT DETECTED NOT DETECTED Final   Escherichia coli NOT DETECTED NOT DETECTED Final   Klebsiella aerogenes NOT DETECTED NOT DETECTED Final   Klebsiella oxytoca NOT DETECTED NOT DETECTED Final   Klebsiella pneumoniae NOT DETECTED NOT DETECTED Final   Proteus species NOT DETECTED NOT DETECTED Final   Salmonella species NOT DETECTED NOT DETECTED Final   Serratia marcescens NOT DETECTED NOT DETECTED Final   Haemophilus influenzae NOT DETECTED NOT DETECTED Final   Neisseria meningitidis NOT DETECTED NOT DETECTED Final   Pseudomonas aeruginosa NOT DETECTED NOT DETECTED Final   Stenotrophomonas maltophilia NOT DETECTED NOT DETECTED Final   Candida albicans NOT DETECTED NOT DETECTED Final   Candida auris NOT DETECTED NOT DETECTED Final   Candida glabrata NOT DETECTED NOT DETECTED Final   Candida krusei NOT DETECTED NOT DETECTED Final   Candida parapsilosis NOT DETECTED NOT DETECTED Final   Candida tropicalis NOT DETECTED NOT DETECTED Final   Cryptococcus neoformans/gattii NOT DETECTED NOT DETECTED Final    Comment: Performed at [facility], [address] [place], [place] [address]    IMAGING: US  Venous Img Lower Unilateral Left (DVT) Result Date: 03/22/2024 CLINICAL DATA:  Left leg swelling EXAM: LEFT LOWER EXTREMITY VENOUS DOPPLER ULTRASOUND TECHNIQUE: Gray-scale sonography with compression, as well as color and duplex ultrasound, were performed to evaluate the deep venous system(s) from the level of the common femoral vein through the popliteal and proximal calf veins. COMPARISON:  None Available. FINDINGS: VENOUS Normal compressibility of the common femoral, superficial femoral, and popliteal veins, as well as the visualized calf veins. Visualized portions of profunda femoral vein and great saphenous vein unremarkable. No filling defects to suggest DVT on  grayscale or color Doppler imaging. Doppler waveforms show normal direction of venous flow, normal respiratory plasticity and response to augmentation. Limited views of the contralateral common femoral vein are unremarkable. OTHER None. Limitations: none IMPRESSION: Negative. Electronically Signed   By: Waddell Calk M.D.   On: 03/22/2024 10:54   CT ABDOMEN PELVIS WO CONTRAST Result Date: 03/22/2024 CLINICAL DATA:  Chronic right UPJ stent. Sepsis. Evaluate for source of infection. EXAM: CT ABDOMEN AND PELVIS WITHOUT CONTRAST TECHNIQUE: Multidetector CT imaging of the abdomen and pelvis was performed following the standard protocol without IV contrast. RADIATION DOSE REDUCTION: This exam was performed according to the departmental dose-optimization program which includes automated exposure control, adjustment of the mA and/or kV according to patient size and/or use of iterative reconstruction technique. COMPARISON:  10/19/2022 FINDINGS:  Lower chest: No pleural fluid or consolidative change. Small nodules within the left base are similar measuring up to 4 mm, image 10/4. Hepatobiliary: No focal liver abnormality. Gallbladder appears normal. Pancreas is suboptimally visualized. Pancreas: Atrophic appearance of the body and tail of pancreas as before. No signs of inflammation or mass. Spleen: Normal in size without focal abnormality. Adrenals/Urinary Tract: Normal adrenal glands. There is a right-sided nephroureteral stent in place. Right-sided pelviectasis is identified. The right renal pelvis measures 2.2 cm on today's study versus 3.2 cm on the previous exam. No signs of high-grade obstructive uropathy. Left kidney is unremarkable. Urinary bladder is decompressed around a Foley catheter balloon. Distal portion of the right ureteral stent is in the decompressed bladder. Stomach/Bowel: Stomach appears within normal limits. No pathologic dilatation of the large or small bowel loops. The appendix is not confidently  identified. No pericecal inflammation. There is a moderate stool burden identified within the ascending and proximal transverse colon. Large volume of desiccated stool is noted within the distended rectum. There is gaseous distension of the left colon proximal to this level. Vascular/Lymphatic: Aortic atherosclerosis without aneurysm. No signs of abdominopelvic adenopathy. Reproductive: Status post hysterectomy. No adnexal masses. Other: No free fluid or fluid collections. No signs of pneumoperitoneum. Musculoskeletal: Postoperative changes identified within the lower thoracic, lumbar spine and sacrum. On multilevel lumbar spondylosis noted. No acute osseous pathology. IMPRESSION: 1. No acute findings within the abdomen or pelvis. 2. Right-sided nephroureteral stent in place. Right-sided pelviectasis is identified. The right renal pelvis measures 2.2 cm on today's study versus 3.2 cm on the previous exam. No signs of high-grade obstructive uropathy. 3. Large volume of desiccated stool is noted within the distended rectum. There is gaseous distension of the left colon proximal to this level. Correlate for any clinical signs or symptoms of constipation or fecal impaction. 4. Small nodules within the left base are similar measuring up to 4 mm. 5.  Aortic Atherosclerosis (ICD10-I70.0). Electronically Signed   By: Waddell Calk M.D.   On: 03/22/2024 10:22   DG Chest 2 View Result Date: 03/22/2024 CLINICAL DATA:  Sepsis, unclear source EXAM: CHEST - 2 VIEW COMPARISON:  04/28/2022. FINDINGS: Cardiac silhouette is unremarkable. No pneumothorax or pleural effusion. Linear basilar subsegmental atelectasis on the left. Lungs otherwise clear. Aorta is calcified. Thoracic degenerative changes and thoracolumbar spine fixation hardware noted. IMPRESSION: Left basilar subsegmental atelectasis. Electronically Signed   By: Fonda Field M.D.   On: 03/22/2024 10:13    Assessment:   TENIOLA TSENG is a 83 y.o. female with  chronic kidney disease, anemia, A-fib, chronic urinary retention with a Foley cath presents with generalized weakness.  In the ED she was found to have no fever but a white count of 18.  Lactic acid was elevated at 2.  Creatinine was around her baseline 3.  Urine sample showed moderate leukocyte esterase greater than 50 whites urine cultures pending. She had a CT scan done that showed stent in place on the right side nephroureteral stent but no signs of high-grade obstructive uropathy.  There was desiccated stool large volume and distended rectum.  Chest x-ray was normal.  Lower extremity Doppler was negative. Since then blood cultures have returned positive with 1 of 2 growing gram-positive cocci identified as staph species not Staph aureus. UCX > 100 K GNR She also has progressive decline per her husband over last few weeks. Has progressive anemia.    Recommendations UTI - in pt with chronic foley and nephrouteral stent  chronically. She has complicated history and has had stent in place and chronic foley for several years. Per her husband was on chronic suppressive abx several years ago with Dr Ike but since having foley cath placed has only had a few UTIs and not on recent abx Cont meropenem  pending ID  Bacteremia- 1/2 sets with non staph aureus staph species. I suspect contaminant. No pacemaker or lines. Repeat bcx ordered.   Possible impaction- noted on CT = husband says has BMs every few days. Consider bowel regimen.  Progressive decline at home for last several weeks  Progressive anemia - unclear etiology.   Thank you very much for allowing me to participate in the care of this patient. Please call with questions.   Alm SQUIBB. Epifanio, MD

## 2024-03-23 NOTE — Progress Notes (Signed)
 PHARMACY - PHYSICIAN COMMUNICATION CRITICAL VALUE ALERT - BLOOD CULTURE IDENTIFICATION (BCID)  Stephanie [name] is an 83 y.o. female who presented to [facility] on 03/22/2024 with a chief complaint of sepsis secondary to UTI   Assessment:  Staph species in 1 of 4 bottles (include suspected source if known)  Name of physician (or Provider) Contacted: [place]  Current antibiotics: Meropenem  500 mg IV Q12H  Changes to prescribed antibiotics recommended:  - MD wants to defer to day shift regarding changing abx  - continue meropenem  for now   Results for orders placed or performed during the hospital encounter of 03/22/24  Blood Culture ID Panel (Reflexed) (Collected: 03/22/2024  9:38 AM)  Result Value Ref Range   Enterococcus faecalis NOT DETECTED NOT DETECTED   Enterococcus Faecium NOT DETECTED NOT DETECTED   Listeria monocytogenes NOT DETECTED NOT DETECTED   Staphylococcus species DETECTED (A) NOT DETECTED   Staphylococcus aureus (BCID) NOT DETECTED NOT DETECTED   Staphylococcus epidermidis NOT DETECTED NOT DETECTED   Staphylococcus lugdunensis NOT DETECTED NOT DETECTED   Streptococcus species NOT DETECTED NOT DETECTED   Streptococcus agalactiae NOT DETECTED NOT DETECTED   Streptococcus pneumoniae NOT DETECTED NOT DETECTED   Streptococcus pyogenes NOT DETECTED NOT DETECTED   A.calcoaceticus-baumannii NOT DETECTED NOT DETECTED   Bacteroides fragilis NOT DETECTED NOT DETECTED   Enterobacterales NOT DETECTED NOT DETECTED   Enterobacter cloacae complex NOT DETECTED NOT DETECTED   Escherichia coli NOT DETECTED NOT DETECTED   Klebsiella aerogenes NOT DETECTED NOT DETECTED   Klebsiella oxytoca NOT DETECTED NOT DETECTED   Klebsiella pneumoniae NOT DETECTED NOT DETECTED   Proteus species NOT DETECTED NOT DETECTED   Salmonella species NOT DETECTED NOT DETECTED   Serratia marcescens NOT DETECTED NOT DETECTED   Haemophilus influenzae NOT DETECTED NOT DETECTED   Neisseria meningitidis NOT  DETECTED NOT DETECTED   Pseudomonas aeruginosa NOT DETECTED NOT DETECTED   Stenotrophomonas maltophilia NOT DETECTED NOT DETECTED   Candida albicans NOT DETECTED NOT DETECTED   Candida auris NOT DETECTED NOT DETECTED   Candida glabrata NOT DETECTED NOT DETECTED   Candida krusei NOT DETECTED NOT DETECTED   Candida parapsilosis NOT DETECTED NOT DETECTED   Candida tropicalis NOT DETECTED NOT DETECTED   Cryptococcus neoformans/gattii NOT DETECTED NOT DETECTED    Nealy Hickmon D 03/23/2024  6:21 AM

## 2024-03-23 NOTE — Progress Notes (Addendum)
 PROGRESS NOTE  Stephanie [name]  FMW:[id number] DOB: 13-May-1941 DOA: 03/22/2024 PCP: Cleotilde Oneil FALCON, MD  Consultants  Brief Narrative: 83 y.o. female with a PMH significant forstage 4 CKD with anemia, afib not on AC, chronic urinary retention with foley, remote colon CA, DM, HLD, HTN, RA, and SBO who presented on 6/22 with generalized weakness. She reports that she came in because they made me.  She was so weak that she couldn't get from the bed to the couch.  She hardly ate at all yesterday.  She has been complaining that I feel terrible.  General malaise when asked further.  No obvious urinary symptoms.  No fever.  She has been tired all the time with poor appetite for several weeks but it was clearly worse today.   Assessment & Plan: Sepsis due to UTI Resolved sepsis criteria.  WBC improving.   Suspected source is complicated UTI (indwelling foley) Blood and urine cultures pending Will admit due to: h/o ESBL, complicated UTI On IV Meropenem  for presumed ESBL urinary source Husband reports every other month UTIs since placement of Foley 4 years ago. Was previously on ppx abx, stopped when urologist left his practice.  I don't see that she's ever seen ID for these recurrent infections/colonizations-->consulting today.     Staph bacteremia: - noted blood culture 1/2 bottles - different organism (gram +) than has been noted prior UTIs - consulting ID due to staph bacteremia - MRSA swab negative   H/o urinary retention and hydronephrosis Foley exchanged in the ER Hydronephrosis appears to be improved from prior s/p stent Stent appears to be in good position on imaging  Constipation: - patient reports liquid stools usually.  No BM x 2 days.  - CT with concern for possible impaction.   - liquid stool could be encopresis.  Will add miralax and enema x 1.    Transaminitis:  - elevated LFTs, AST>ALT - trending.  Unclear immediate cause   Stage 4-5 CKD GFR 14-16 throughout  2025 Appears to be stable at this time Attempt to avoid nephrotoxic medications Hold torsemide for now Recheck BMP in AM    Anemia of CKD Hgb stable at 9.3 No evidence of bleeding at this time   DM2 Last A1c was 7.2, reasonable control Hold glipizide , consider alternative therapy as an outpatient given its propensity for hypoglycemia in the elderly Cover with sensitive-scale SSI Carb modified diet    HLD Continue rosuvastatin    HTN Elevated while in ER, better since restarting home meds.  Resume home diltiazem , hydralazine , and metoprolol    RA On leflunomide  Will hold in the setting of active infection   COPD  Continue Advair (Breo per formulary) Prn Albuterol    Glaucoma Continue brimonidine     DVT prophylaxis:  enoxaparin  (LOVENOX ) injection 30 mg Start: 03/22/24 2200  Code Status:   Code Status: Full Code Family Communication: Husband at bedside, all questions answered Level of care: Telemetry Medical Status is: Inpatient Dispo:  pending clinical improvement.   Consults called: ID   Subjective: Patient still feels unwell.  Describes decreased appetite and weakness.  Denies nausea since coming to floor.    Objective: Vitals:   03/22/24 1655 03/22/24 2101 03/23/24 0422 03/23/24 0734  BP: (!) 177/69 (!) 165/65 (!) 163/65 (!) 151/57  Pulse: 63 (!) 59 70 74  Resp: 19 18 18 17   Temp: 98 F (36.7 C) 98.5 F (36.9 C) 98.9 F (37.2 C) 98.8 F (37.1 C)  TempSrc:  Oral Oral Oral  SpO2:  96% 97% 95% 93%  Weight:      Height:        Intake/Output Summary (Last 24 hours) at 03/23/2024 1343 Last data filed at 03/22/2024 1928 Gross per 24 hour  Intake 240 ml  Output --  Net 240 ml   Filed Weights   03/22/24 1000 03/22/24 1414  Weight: 69.4 kg 69.4 kg   Body mass index is 24.69 kg/m.  Gen: 83 y.o. female in no apparent distress.  Nontoxic, elderly, frail appearing.   Pulm: Non-labored breathing.  Clear to auscultation bilaterally.  CV: Regular rate and  rhythm. No murmur, rub, or gallop. No JVD GI: Abdomen soft.  No notable distension, does have some BL lower quadrant tenderness Ext: Warm, no deformities, +3 LE edema on Left, +2 on right, each to mid-shin Skin: No rashes, lesions  Neuro: Alert and oriented. No focal neurological deficits. Psych: Calm  Judgement and insight appear normal. Mood & affect appropriate.     I have personally reviewed the following labs and images: CBC: Recent Labs  Lab 03/22/24 0850 03/23/24 0329  WBC 18.3* 11.1*  NEUTROABS 14.4*  --   HGB 9.3* 8.1*  HCT 30.8* 27.2*  MCV 97.2 96.1  PLT 208 166   BMP &GFR Recent Labs  Lab 03/22/24 0850 03/23/24 0329  NA 134* 134*  K 5.1 4.7  CL 105 108  CO2 20* 20*  GLUCOSE 191* 100*  BUN 53* 49*  CREATININE 3.32* 2.86*  CALCIUM  8.4* 8.5*   Estimated Creatinine Clearance: 14.4 mL/min (A) (by C-G formula based on SCr of 2.86 mg/dL (H)). Liver & Pancreas: Recent Labs  Lab 03/22/24 0850  AST 127*  ALT 87*  ALKPHOS 108  BILITOT 0.8  PROT 5.5*  ALBUMIN  3.1*   Recent Labs  Lab 03/22/24 0850  LIPASE 28   No results for input(s): AMMONIA in the last 168 hours. Diabetic: No results for input(s): HGBA1C in the last 72 hours. Recent Labs  Lab 03/22/24 1215 03/22/24 1657 03/22/24 2348 03/23/24 0728 03/23/24 1150  GLUCAP 164* 149* 120* 101* 131*   Cardiac Enzymes: No results for input(s): CKTOTAL, CKMB, CKMBINDEX, TROPONINI in the last 168 hours. No results for input(s): PROBNP in the last 8760 hours. Coagulation Profile: No results for input(s): INR, PROTIME in the last 168 hours. Thyroid Function Tests: No results for input(s): TSH, T4TOTAL, FREET4, T3FREE, THYROIDAB in the last 72 hours. Lipid Profile: No results for input(s): CHOL, HDL, LDLCALC, TRIG, CHOLHDL, LDLDIRECT in the last 72 hours. Anemia Panel: No results for input(s): VITAMINB12, FOLATE, FERRITIN, TIBC, IRON , RETICCTPCT in the  last 72 hours. Urine analysis:    Component Value Date/Time   COLORURINE YELLOW (A) 03/22/2024 0850   APPEARANCEUR TURBID (A) 03/22/2024 0850   APPEARANCEUR Cloudy (A) 11/18/2023 1132   LABSPEC 1.016 03/22/2024 0850   LABSPEC 1.013 01/04/2015 1239   PHURINE 5.0 03/22/2024 0850   GLUCOSEU NEGATIVE 03/22/2024 0850   GLUCOSEU Negative 01/04/2015 1239   HGBUR NEGATIVE 03/22/2024 0850   BILIRUBINUR NEGATIVE 03/22/2024 0850   BILIRUBINUR Negative 11/18/2023 1132   BILIRUBINUR Negative 01/04/2015 1239   KETONESUR NEGATIVE 03/22/2024 0850   PROTEINUR >=300 (A) 03/22/2024 0850   NITRITE NEGATIVE 03/22/2024 0850   LEUKOCYTESUR MODERATE (A) 03/22/2024 0850   LEUKOCYTESUR 2+ 01/04/2015 1239   Sepsis Labs: Invalid input(s): PROCALCITONIN, LACTICIDVEN  Microbiology: Recent Results (from the past 240 hours)  Resp panel by RT-PCR (RSV, Flu A&B, Covid) Anterior Nasal Swab     Status: None  Collection Time: 03/22/24  8:50 AM   Specimen: Anterior Nasal Swab  Result Value Ref Range Status   SARS Coronavirus 2 by RT PCR NEGATIVE NEGATIVE Final    Comment: (NOTE) SARS-CoV-2 target nucleic acids are NOT DETECTED.  The SARS-CoV-2 RNA is generally detectable in upper respiratory specimens during the acute phase of infection. The lowest concentration of SARS-CoV-2 viral copies this assay can detect is 138 copies/mL. A negative result does not preclude SARS-Cov-2 infection and should not be used as the sole basis for treatment or other patient management decisions. A negative result may occur with  improper specimen collection/handling, submission of specimen other than nasopharyngeal swab, presence of viral mutation(s) within the areas targeted by this assay, and inadequate number of viral copies(<138 copies/mL). A negative result must be combined with clinical observations, patient history, and epidemiological information. The expected result is Negative.  Fact Sheet for Patients:   BloggerCourse.com  Fact Sheet for Healthcare Providers:  SeriousBroker.it  This test is no t yet approved or cleared by the [place]  FDA and  has been authorized for detection and/or diagnosis of SARS-CoV-2 by FDA under an Emergency Use Authorization (EUA). This EUA will remain  in effect (meaning this test can be used) for the duration of the COVID-19 declaration under Section 564(b)(1) of the Act, 21 U.S.C.section 360bbb-3(b)(1), unless the authorization is terminated  or revoked sooner.       Influenza A by PCR NEGATIVE NEGATIVE Final   Influenza B by PCR NEGATIVE NEGATIVE Final    Comment: (NOTE) The Xpert Xpress SARS-CoV-2/FLU/RSV plus assay is intended as an aid in the diagnosis of influenza from Nasopharyngeal swab specimens and should not be used as a sole basis for treatment. Nasal washings and aspirates are unacceptable for Xpert Xpress SARS-CoV-2/FLU/RSV testing.  Fact Sheet for Patients: BloggerCourse.com  Fact Sheet for Healthcare Providers: SeriousBroker.it  This test is not yet approved or cleared by the [place]  FDA and has been authorized for detection and/or diagnosis of SARS-CoV-2 by FDA under an Emergency Use Authorization (EUA). This EUA will remain in effect (meaning this test can be used) for the duration of the COVID-19 declaration under Section 564(b)(1) of the Act, 21 U.S.C. section 360bbb-3(b)(1), unless the authorization is terminated or revoked.     Resp Syncytial Virus by PCR NEGATIVE NEGATIVE Final    Comment: (NOTE) Fact Sheet for Patients: BloggerCourse.com  Fact Sheet for Healthcare Providers: SeriousBroker.it  This test is not yet approved or cleared by the [place]  FDA and has been authorized for detection and/or diagnosis of SARS-CoV-2 by FDA under an Emergency Use  Authorization (EUA). This EUA will remain in effect (meaning this test can be used) for the duration of the COVID-19 declaration under Section 564(b)(1) of the Act, 21 U.S.C. section 360bbb-3(b)(1), unless the authorization is terminated or revoked.  Performed at [facility], [address], [place], [place] [address]   Urine Culture (for pregnant, neutropenic or urologic patients or patients with an indwelling urinary catheter)     Status: Abnormal (Preliminary result)   Collection Time: 03/22/24  8:50 AM   Specimen: Urine, Catheterized  Result Value Ref Range Status   Specimen Description   Final    URINE, CATHETERIZED Performed at [facility], [address] [place], [place] [address]    Special Requests   Final    NONE Performed at [facility], [address] [place], [place] [address]    Culture (A)  Final    >=  100,000 COLONIES/mL GRAM NEGATIVE RODS IDENTIFICATION TO FOLLOW Performed at [facility], [address] [place], [place] [address]    Report Status PENDING  Incomplete  Blood culture (routine x 2)     Status: None (Preliminary result)   Collection Time: 03/22/24  9:38 AM   Specimen: BLOOD  Result Value Ref Range Status   Specimen Description   Final    BLOOD LEFT ANTECUBITAL Performed at [facility], [address] [place], [place] [address]    Special Requests   Final    BOTTLES DRAWN AEROBIC AND ANAEROBIC Blood Culture results may not be optimal due to an inadequate volume of blood received in culture bottles Performed at [facility], [address], [place], [place] [address]    Culture  Setup Time   Final    GRAM POSITIVE COCCI AEROBIC BOTTLE ONLY CRITICAL RESULT CALLED TO, READ BACK BY AND VERIFIED WITH: JASON ROBBINS 03/23/24 0445 MW Performed at [facility], [address] [place], [place] [address]    Culture GRAM POSITIVE COCCI  Final   Report Status PENDING  Incomplete  Blood  culture (routine x 2)     Status: None (Preliminary result)   Collection Time: 03/22/24  9:38 AM   Specimen: BLOOD  Result Value Ref Range Status   Specimen Description BLOOD BLOOD LEFT ARM  Final   Special Requests   Final    BOTTLES DRAWN AEROBIC AND ANAEROBIC Blood Culture adequate volume   Culture   Final    NO GROWTH < 24 HOURS Performed at [facility], [address] [place], [place] [address]    Report Status PENDING  Incomplete  Blood Culture ID Panel (Reflexed)     Status: Abnormal   Collection Time: 03/22/24  9:38 AM  Result Value Ref Range Status   Enterococcus faecalis NOT DETECTED NOT DETECTED Final   Enterococcus Faecium NOT DETECTED NOT DETECTED Final   Listeria monocytogenes NOT DETECTED NOT DETECTED Final   Staphylococcus species DETECTED (A) NOT DETECTED Final    Comment: CRITICAL RESULT CALLED TO, READ BACK BY AND VERIFIED WITH: JASON ROBBINS 03/23/24 0445 MW    Staphylococcus aureus (BCID) NOT DETECTED NOT DETECTED Final   Staphylococcus epidermidis NOT DETECTED NOT DETECTED Final   Staphylococcus lugdunensis NOT DETECTED NOT DETECTED Final   Streptococcus species NOT DETECTED NOT DETECTED Final   Streptococcus agalactiae NOT DETECTED NOT DETECTED Final   Streptococcus pneumoniae NOT DETECTED NOT DETECTED Final   Streptococcus pyogenes NOT DETECTED NOT DETECTED Final   A.calcoaceticus-baumannii NOT DETECTED NOT DETECTED Final   Bacteroides fragilis NOT DETECTED NOT DETECTED Final   Enterobacterales NOT DETECTED NOT DETECTED Final   Enterobacter cloacae complex NOT DETECTED NOT DETECTED Final   Escherichia coli NOT DETECTED NOT DETECTED Final   Klebsiella aerogenes NOT DETECTED NOT DETECTED Final   Klebsiella oxytoca NOT DETECTED NOT DETECTED Final   Klebsiella pneumoniae NOT DETECTED NOT DETECTED Final   Proteus species NOT DETECTED NOT DETECTED Final   Salmonella species NOT DETECTED NOT DETECTED Final   Serratia marcescens NOT DETECTED NOT  DETECTED Final   Haemophilus influenzae NOT DETECTED NOT DETECTED Final   Neisseria meningitidis NOT DETECTED NOT DETECTED Final   Pseudomonas aeruginosa NOT DETECTED NOT DETECTED Final   Stenotrophomonas maltophilia NOT DETECTED NOT DETECTED Final   Candida albicans NOT DETECTED NOT DETECTED Final   Candida auris NOT DETECTED NOT DETECTED Final   Candida glabrata NOT DETECTED NOT DETECTED Final  Candida krusei NOT DETECTED NOT DETECTED Final   Candida parapsilosis NOT DETECTED NOT DETECTED Final   Candida tropicalis NOT DETECTED NOT DETECTED Final   Cryptococcus neoformans/gattii NOT DETECTED NOT DETECTED Final    Comment: Performed at [facility], [address] [place], [place] [address]  MRSA Next Gen by PCR, Nasal     Status: None   Collection Time: 03/23/24  9:20 AM   Specimen: Nasal Mucosa; Nasal Swab  Result Value Ref Range Status   MRSA by PCR Next Gen NOT DETECTED NOT DETECTED Final    Comment: (NOTE) The GeneXpert MRSA Assay (FDA approved for NASAL specimens only), is one component of a comprehensive MRSA colonization surveillance program. It is not intended to diagnose MRSA infection nor to guide or monitor treatment for MRSA infections. Test performance is not FDA approved in patients less than 47 years old. Performed at [facility], [address], [place], [place] [address]     Radiology Studies: No results found.  Scheduled Meds:  brimonidine   1 drop Both Eyes BID   diltiazem   240 mg Oral Daily   docusate sodium  100 mg Oral BID   enoxaparin  (LOVENOX ) injection  30 mg Subcutaneous Q24H   hydrALAZINE   50 mg Oral Q8H   insulin  aspart  0-9 Units Subcutaneous TID WC   metoCLOPramide  5 mg Oral BID   metoprolol  tartrate  50 mg Oral BID   pantoprazole   40 mg Oral Daily   rosuvastatin   10 mg Oral Daily   sodium chloride  flush  3 mL Intravenous Q12H   Continuous Infusions:  meropenem  (MERREM ) IV 500 mg (03/23/24 0924)     LOS: 1 day    35 minutes with more than 50% spent in reviewing records, counseling patient/family and coordinating care.  Reyes VEAR Gaw, MD [facility] [email or web address] 03/23/2024, 1:43 PM

## 2024-03-23 NOTE — Evaluation (Signed)
 Occupational Therapy Evaluation Patient Details Name: Stephanie [name] MRN: [id number] DOB: 02/22/1941 Today's Date: 03/23/2024   History of Present Illness   Stephanie [name] is an 83yoF who comes to [facility] on 03/22/24 for acute onset weakness. Pt admitted with UTI. anemia, asthma, chronic kidney disease, type 2 diabetes mellitus, atrial fibrillation, GERD, glaucoma, hypertension, dyslipidemia, and vitamin B-12 deficiency, right hydronephrosis and right ureteral stent. At baseline pt is a household AMB with RW, requires intermittent minA for bed mobility and low surface transfers from husband.    Clinical Impressions Pt was seen for OT evaluation this date. Prior to hospital admission, pt was grossly indep with basic ADL, ambulating short household distances with RW, and spouse assisted with IADL. Pt presents to acute OT demonstrating impaired ADL performance and functional mobility 2/2 flat affect, decreased strength, activity tolerance, safety awareness, processing, and balance (See OT problem list for additional functional deficits). Pt currently requires MIN-MOD A for bed mobility, CGA-MIN A for static sitting balance, and MAX A for LB ADL tasks. Pt tolerated sitting EOB <61min before endorsing nausea and need to return to supine. RN notified. Pt would benefit from skilled OT services to address noted impairments and functional limitations (see below for any additional details) in order to maximize safety and independence while minimizing falls risk and caregiver burden. Anticipate the need for follow up OT services upon acute hospital DC.   If plan is discharge home, recommend the following:   A little help with walking and/or transfers;A lot of help with bathing/dressing/bathroom;Assistance with cooking/housework;Assist for transportation;Direct supervision/assist for medications management;Help with stairs or ramp for entrance     Functional Status Assessment   Patient has had a recent decline in  their functional status and demonstrates the ability to make significant improvements in function in a reasonable and predictable amount of time.     Equipment Recommendations   Other (comment) (defer)      Precautions/Restrictions   Precautions Precautions: Fall Recall of Precautions/Restrictions: Intact Restrictions Weight Bearing Restrictions Per Provider Order: No     Mobility Bed Mobility Overal bed mobility: Needs Assistance Bed Mobility: Supine to Sit, Sit to Supine     Supine to sit: Min assist, HOB elevated Sit to supine: Mod assist   General bed mobility comments: increased assist to return to bed 2/2 pt endorsing feeling as though she may pass out    Transfers       General transfer comment: deferred      Balance Overall balance assessment: Needs assistance Sitting-balance support: Feet supported, No upper extremity supported, Single extremity supported Sitting balance-Leahy Scale: Fair Sitting balance - Comments: fair-, CGA to MIN A To support static sitting balance Postural control: Posterior lean         ADL either performed or assessed with clinical judgement   ADL Overall ADL's : Needs assistance/impaired     General ADL Comments: Pt currently requires MAX A for LB ADL tasks and MIN-MOD A for UB ADL      Pertinent Vitals/Pain Pain Assessment Pain Assessment: No/denies pain     Extremity/Trunk Assessment Upper Extremity Assessment Upper Extremity Assessment: Generalized weakness   Lower Extremity Assessment Lower Extremity Assessment: Generalized weakness       Communication Communication Communication: Impaired Factors Affecting Communication: Hearing impaired   Cognition Arousal: Alert Behavior During Therapy: Flat affect               OT - Cognition Comments: Pt alert, questionable awareness of situation, difficulty with problem  solving, slow processing, also HOH                 Following commands:  Impaired Following commands impaired: Follows one step commands with increased time     Cueing  General Comments   Cueing Techniques: Verbal cues              Home Living Family/patient expects to be discharged to:: Private residence Living Arrangements: Spouse/significant other Available Help at Discharge: Family Type of Home: House Home Access: Stairs to enter [employer] of Steps: 3-4 partial steps c 1 rail (typically able to manage well with husband assist)   Home Layout: One level         Bathroom Toilet: Handicapped height     Home Equipment: Agricultural consultant (2 wheels);Rollator (4 wheels);Wheelchair - manual          Prior Functioning/Environment Prior Level of Function : Needs assist             Mobility Comments: household AMB with RW, has WC for mobility out of home. ADLs Comments: pt was able to complete basic ADL herself, assist from spouse for IADL    OT Problem List: Decreased strength;Decreased activity tolerance;Decreased safety awareness;Impaired balance (sitting and/or standing);Decreased knowledge of use of DME or AE;Decreased cognition   OT Treatment/Interventions: Self-care/ADL training;Therapeutic exercise;Therapeutic activities;Cognitive remediation/compensation;DME and/or AE instruction;Energy conservation;Patient/family education;Balance training      OT Goals(Current goals can be found in the care plan section)   Acute Rehab OT Goals Patient Stated Goal: go home OT Goal Formulation: With patient/family Time For Goal Achievement: 04/06/24 Potential to Achieve Goals: Good ADL Goals Pt Will Perform Upper Body Dressing: sitting;with min assist Pt Will Perform Lower Body Dressing: sit to/from stand;with mod assist Pt Will Transfer to Toilet: bedside commode;stand pivot transfer;with mod assist (LRAD) Pt Will Perform Toileting - Clothing Manipulation and hygiene: with min assist;sitting/lateral leans   OT Frequency:  Min  2X/week       AM-PAC OT 6 Clicks Daily Activity     Outcome Measure Help from another person eating meals?: None Help from another person taking care of personal grooming?: A Little Help from another person toileting, which includes using toliet, bedpan, or urinal?: A Lot Help from another person bathing (including washing, rinsing, drying)?: A Lot Help from another person to put on and taking off regular upper body clothing?: A Lot Help from another person to put on and taking off regular lower body clothing?: A Lot 6 Click Score: 15   End of Session Nurse Communication: Mobility status;Other (comment) (nausea)  Activity Tolerance: Other (comment) (nausea) Patient left: in bed;with call bell/phone within reach;with bed alarm set;with family/visitor present  OT Visit Diagnosis: Other abnormalities of gait and mobility (R26.89);Muscle weakness (generalized) (M62.81)                Time: 9055-8998 OT Time Calculation (min): 17 min Charges:  OT General Charges $OT Visit: 1 Visit OT Evaluation $OT Eval Low Complexity: 1 Low  Warren SAUNDERS., MPH, MS, OTR/L ascom [phone] 03/23/24, 2:38 PM

## 2024-03-23 NOTE — Progress Notes (Signed)
 Physical Therapy Treatment Patient Details Name: Stephanie [name] MRN: [id number] DOB: 22-Jan-1941 Today's Date: 03/23/2024   History of Present Illness Stephanie [name] is an 83 y.o F who comes to [facility] on 03/22/24 for acute onset weakness. Pt admitted with UTI. anemia, asthma, chronic kidney disease, type 2 diabetes mellitus, atrial fibrillation, GERD, glaucoma, hypertension, dyslipidemia, and vitamin B-12 deficiency, right hydronephrosis and right ureteral stent. At baseline pt is a household AMB with RW, requires intermittent minA for bed mobility and low surface transfers from husband.    PT Comments  Pt was asleep supine in bed upon entry and easily woken. Pt was agreeable to session and motivated to try her best. Pt was able to perform bed mobility and sit EOB with min a for truncal control and BLE management, performed STS from an elevated bed height to RW with min A and was able to maintain standing for ~35 seconds before requesting a seated rest break (increased R knee flexion noted in standing).  During seated break pt became minimally responsive to verbal cues, and her body started to give way briefly. This prompted PT to scoop pt into bed (Max A) and take vitals. Vitals read as follows BP: 172/63 (94) HR:65 bpm SpO2:99% on room air. Once positioned in bed, pt immediately began to respond to questions, with reported dizziness no longer present. Following treatment pt was able to lie in bed with HOB elevated to each lunch, with no residual symptoms. Nurse and medical team notified. Pt would benefit from skilled PT to continue to work towards PT goals and return to PLOF.      If plan is discharge home, recommend the following: A lot of help with walking and/or transfers;Assistance with cooking/housework;Help with stairs or ramp for entrance;Assist for transportation;Direct supervision/assist for financial management;Direct supervision/assist for medications management;Supervision due to cognitive status;A  lot of help with bathing/dressing/bathroom   Can travel by private vehicle     Yes  Equipment Recommendations  None recommended by PT    Recommendations for Other Services       Precautions / Restrictions Precautions Precautions: Fall Recall of Precautions/Restrictions: Intact Restrictions Weight Bearing Restrictions Per Provider Order: No     Mobility  Bed Mobility Overal bed mobility: Needs Assistance Bed Mobility: Supine to Sit, Sit to Supine, Rolling Rolling: Mod assist, +2 for physical assistance (for pericare due to noted BM)   Supine to sit: HOB elevated, Mod assist Sit to supine: Max assist   General bed mobility comments: increased assist to return to bed due to pt became minimally responsive to verbal cues, and her body started to give way following standing bout.    Transfers Overall transfer level: Needs assistance Equipment used: Rolling walker (2 wheels) Transfers: Sit to/from Stand Sit to Stand: Min assist                Ambulation/Gait               General Gait Details: Defered due to decreased exercise tolerance and reports of nausea   Stairs             Wheelchair Mobility     Tilt Bed    Modified Rankin (Stroke Patients Only)       Balance Overall balance assessment: Needs assistance Sitting-balance support: Feet supported, No upper extremity supported, Single extremity supported Sitting balance-Leahy Scale: Fair Sitting balance - Comments: CGA for static sitting with vc's for breathing due to increased reports nausea Postural control: Right lateral lean  Standing balance support: Reliant on assistive device for balance Standing balance-Leahy Scale: Fair Standing balance comment: Was able to maintain static standing balance for 35 seconds before requesting a seated break.                            Communication Communication Communication: Impaired Factors Affecting Communication: Hearing impaired   Cognition Arousal: Alert Behavior During Therapy: Flat affect   PT - Cognitive impairments: Difficult to assess                         Following commands: Impaired Following commands impaired: Follows one step commands with increased time    Cueing Cueing Techniques: Verbal cues, Tactile cues  Exercises      General Comments General comments (skin integrity, edema, etc.): Pre session: HR: 63 bpm, SpO2: 92% on room air, After exercise bout: BP: 172/63 (94) HR:65 bpm SpO2:99% on room air, After session: HR: 64 bpm, SpO2: 95+% on room air.      Pertinent Vitals/Pain Pain Assessment Pain Assessment: No/denies pain    Home Living    Prior Function            PT Goals (current goals can now be found in the care plan section) Acute Rehab PT Goals Patient Stated Goal: regain baseline strength for return to home PT Goal Formulation: With family Time For Goal Achievement: 04/05/24 Potential to Achieve Goals: Fair Progress towards PT goals: Progressing toward goals    Frequency    Min 2X/week      PT Plan      Co-evaluation              AM-PAC PT 6 Clicks Mobility   Outcome Measure  Help needed turning from your back to your side while in a flat bed without using bedrails?: A Lot Help needed moving from lying on your back to sitting on the side of a flat bed without using bedrails?: A Lot Help needed moving to and from a bed to a chair (including a wheelchair)?: A Lot Help needed standing up from a chair using your arms (e.g., wheelchair or bedside chair)?: A Lot Help needed to walk in hospital room?: Total Help needed climbing 3-5 steps with a railing? : Total 6 Click Score: 10    End of Session Equipment Utilized During Treatment: Gait belt Activity Tolerance: No increased pain;Patient limited by fatigue Patient left: in bed;with call bell/phone within reach;with family/visitor present;with bed alarm set Nurse Communication: Mobility  status;Precautions;Other (comment) (Medical team notified of brief decrease responsiveness episode following standing bout.) PT Visit Diagnosis: Difficulty in walking, not elsewhere classified (R26.2);Other abnormalities of gait and mobility (R26.89);Muscle weakness (generalized) (M62.81)     Time: 8799-8763 PT Time Calculation (min) (ACUTE ONLY): 36 min  Charges:                           Brien Lowe, SPT 03/23/24, 4:05 PM

## 2024-03-23 NOTE — Plan of Care (Signed)

## 2024-03-24 DIAGNOSIS — N390 Urinary tract infection, site not specified: Secondary | ICD-10-CM | POA: Diagnosis not present

## 2024-03-24 DIAGNOSIS — A419 Sepsis, unspecified organism: Secondary | ICD-10-CM | POA: Diagnosis not present

## 2024-03-24 LAB — GLUCOSE, CAPILLARY
Glucose-Capillary: 130 mg/dL — ABNORMAL HIGH (ref 70–99)
Glucose-Capillary: 193 mg/dL — ABNORMAL HIGH (ref 70–99)
Glucose-Capillary: 221 mg/dL — ABNORMAL HIGH (ref 70–99)
Glucose-Capillary: 144 mg/dL — ABNORMAL HIGH (ref 70–99)

## 2024-03-24 LAB — CBC
HCT: 32.4 % — ABNORMAL LOW (ref 36.0–46.0)
Hemoglobin: 9.8 g/dL — ABNORMAL LOW (ref 12.0–15.0)
MCH: 29.1 pg (ref 26.0–34.0)
MCHC: 30.2 g/dL (ref 30.0–36.0)
MCV: 96.1 fL (ref 80.0–100.0)
Platelets: 203 10*3/uL (ref 150–400)
RBC: 3.37 MIL/uL — ABNORMAL LOW (ref 3.87–5.11)
RDW: 15.6 % — ABNORMAL HIGH (ref 11.5–15.5)
WBC: 12.6 10*3/uL — ABNORMAL HIGH (ref 4.0–10.5)
nRBC: 0 % (ref 0.0–0.2)

## 2024-03-24 LAB — COMPREHENSIVE METABOLIC PANEL WITH GFR
ALT: 33 U/L (ref 0–44)
AST: 18 U/L (ref 15–41)
Albumin: 2.7 g/dL — ABNORMAL LOW (ref 3.5–5.0)
Alkaline Phosphatase: 86 U/L (ref 38–126)
Anion gap: 8 (ref 5–15)
BUN: 46 mg/dL — ABNORMAL HIGH (ref 8–23)
CO2: 19 mmol/L — ABNORMAL LOW (ref 22–32)
Calcium: 8.3 mg/dL — ABNORMAL LOW (ref 8.9–10.3)
Chloride: 109 mmol/L (ref 98–111)
Creatinine, Ser: 2.87 mg/dL — ABNORMAL HIGH (ref 0.44–1.00)
GFR, Estimated: 16 mL/min — ABNORMAL LOW (ref >60)
Glucose, Bld: 124 mg/dL — ABNORMAL HIGH (ref 70–99)
Potassium: 3.9 mmol/L (ref 3.5–5.1)
Sodium: 136 mmol/L (ref 135–145)
Total Bilirubin: 0.8 mg/dL (ref 0.0–1.2)
Total Protein: 5.5 g/dL — ABNORMAL LOW (ref 6.5–8.1)

## 2024-03-24 LAB — URINE CULTURE: Culture: >=100000 — AB

## 2024-03-24 MED ORDER — CLONIDINE HCL 0.1 MG PO TABS
0.1000 mg | ORAL_TABLET | Freq: Every day | ORAL | Status: DC
  Administered 2024-03-24 – 2024-04-01 (×9): 0.1 mg via ORAL
  Filled 2024-03-24 (×9): qty 1

## 2024-03-24 MED ORDER — DILTIAZEM HCL ER COATED BEADS 300 MG PO CP24
300.0000 mg | ORAL_CAPSULE | Freq: Every day | ORAL | Status: DC
  Administered 2024-03-24 – 2024-04-01 (×9): 300 mg via ORAL
  Filled 2024-03-24 (×11): qty 1

## 2024-03-24 MED ORDER — TORSEMIDE 20 MG PO TABS
10.0000 mg | ORAL_TABLET | Freq: Every day | ORAL | Status: DC
  Administered 2024-03-24 – 2024-03-27 (×4): 10 mg via ORAL
  Filled 2024-03-24 (×4): qty 1

## 2024-03-24 MED ORDER — SODIUM CHLORIDE 0.9 % IV SOLN
2.0000 g | INTRAVENOUS | Status: AC
  Administered 2024-03-24 – 2024-03-31 (×8): 2 g via INTRAVENOUS
  Filled 2024-03-24 (×8): qty 20

## 2024-03-24 MED ORDER — CHLORHEXIDINE GLUCONATE CLOTH 2 % EX PADS
6.0000 | MEDICATED_PAD | Freq: Every day | CUTANEOUS | Status: DC
  Administered 2024-03-24 – 2024-04-05 (×12): 6 via TOPICAL

## 2024-03-24 MED ORDER — SODIUM BICARBONATE 650 MG PO TABS
650.0000 mg | ORAL_TABLET | Freq: Two times a day (BID) | ORAL | Status: DC
  Administered 2024-03-24 – 2024-03-29 (×10): 650 mg via ORAL
  Filled 2024-03-24 (×10): qty 1

## 2024-03-24 NOTE — TOC Initial Note (Signed)
 Transition of Care [facility]) - Initial/Assessment Note    Patient Details  Name: Stephanie [name] MRN: [id number] Date of Birth: 08-11-1941  [facility] CM/SW Contact:    Lauraine JAYSON Carpen, LCSW Phone Number: 03/24/2024, 4:35 PM  Clinical Narrative:  CSW met with patient. Husband at bedside. CSW introduced role and explained that therapy recommendations would be discussed. Patient initially declined SNF but after further discussion, she is willing to consider it. Will follow up with bed offers once available. No further concerns. CSW will continue to follow patient and her husband for support and facilitate discharge to SNF once medically stable.                Expected Discharge Plan: Skilled Nursing Facility Barriers to Discharge: Continued Medical Work up   Patient Goals and CMS Choice            Expected Discharge Plan and Services     Post Acute Care Choice: Skilled Nursing Facility Living arrangements for the past 2 months: Single Family Home                                      Prior Living Arrangements/Services Living arrangements for the past 2 months: Single Family Home Lives with:: Spouse Patient language and need for interpreter reviewed:: Yes Do you feel safe going back to the place where you live?: Yes      Need for Family Participation in Patient Care: Yes (Comment) Care giver support system in place?: Yes (comment)   Criminal Activity/Legal Involvement Pertinent to Current Situation/Hospitalization: No - Comment as needed  Activities of Daily Living   ADL Screening (condition at time of admission) Independently performs ADLs?: No Does the patient have a NEW difficulty with bathing/dressing/toileting/self-feeding that is expected to last >3 days?: Yes (Initiates electronic notice to provider for possible OT consult) Does the patient have a NEW difficulty with getting in/out of bed, walking, or climbing stairs that is expected to last >3 days?:  Yes (Initiates electronic notice to provider for possible PT consult) Does the patient have a NEW difficulty with communication that is expected to last >3 days?: Yes (Initiates electronic notice to provider for possible SLP consult) Is the patient deaf or have difficulty hearing?: Yes Does the patient have difficulty seeing, even when wearing glasses/contacts?: Yes Does the patient have difficulty concentrating, remembering, or making decisions?: Yes  Permission Sought/Granted Permission sought to share information with : Magazine features editor, Family Supports Permission granted to share information with : Yes, Verbal Permission Granted  Share Information with NAME: Kellsie Grindle  Permission granted to share info w AGENCY: SNF's  Permission granted to share info w Relationship: Husband  Permission granted to share info w Contact Information: [phone]  Emotional Assessment Appearance:: Appears stated age Attitude/Demeanor/Rapport: Engaged Affect (typically observed): Appropriate, Calm Orientation: : Oriented to Self, Oriented to Place, Oriented to  Time, Oriented to Situation Alcohol / Substance Use: Not Applicable Psych Involvement: No (comment)  Admission diagnosis:  Acute cystitis with hematuria [N30.01] AKI (acute kidney injury) (HCC) [N17.9] Sepsis secondary to UTI (HCC) [A41.9, N39.0] Sepsis with acute renal failure without septic shock, due to unspecified organism, unspecified acute renal failure type (HCC) [A41.9, R65.20, N17.9] Patient Active Problem List   Diagnosis Date Noted   Anemia due to stage 4 chronic kidney disease (HCC) 05/28/2023   Fever    Decubitus ulcer of coccyx, unspecified  pressure ulcer stage 04/29/2022   Infected kidney cyst 04/28/2022   Acute unilateral obstructive uropathy 04/28/2022   Acute renal failure superimposed on stage 3b chronic kidney disease (HCC) 04/28/2022   Dyslipidemia 04/28/2022   GERD without esophagitis 04/28/2022    Hypertensive urgency 04/28/2022   Hypokalemia 03/12/2022   Sepsis secondary to UTI (HCC) 03/09/2022   Indwelling Foley catheter present 03/09/2022   Hyperkalemia 07/25/2021   Hydronephrosis of right kidney 07/25/2021   Type II diabetes mellitus with renal manifestations (HCC)    Symptomatic anemia    Asthma    Acute renal failure superimposed on stage 3a chronic kidney disease (HCC)    UTI (urinary tract infection)    Abnormal computed tomography of large intestine    Polyp of descending colon    S/p exchange of ureteral stent 02/06/22 03/22/2021   Aortic atherosclerosis (HCC) 02/23/2021   CKD (chronic kidney disease) stage 4, GFR 15-29 ml/min (HCC) 02/14/2021   Proteinuria 08/18/2020   UPJ obstruction, acquired 08/18/2020   Anemia secondary to renal failure 07/20/2020   CKD (chronic kidney disease) stage 5, GFR less than 15 ml/min (HCC)    Ileus (HCC)    Paroxysmal atrial fibrillation (HCC)    Goals of care, counseling/discussion    Palliative care by specialist    SBO (small bowel obstruction) (HCC)    Pressure injury of skin 06/19/2020   Acquired thrombophilia (HCC) 06/19/2020   On continuous oral anticoagulation 06/19/2020   Acute pyelonephritis 06/18/2020   Hydronephrosis, right 06/18/2020   Lactic acid acidosis 06/18/2020   Hyperglycemia 06/18/2020   Hx of essential hypertension 06/18/2020   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   Atrial fibrillation with rapid ventricular response (HCC)    Essential hypertension    Pure hypercholesterolemia    Diabetes mellitus (HCC) 06/17/2020   Rheumatoid arthritis (HCC) 06/17/2020   COVID-19 virus infection 06/17/2020   AKI (acute kidney injury) (HCC) 06/17/2020   Chronic left SI joint pain 04/19/2020   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T9-Sacrum) 04/19/2020   Chronic radicular lumbar pain 04/19/2020   Lumbar spondylosis 04/19/2020   Chronic pain syndrome 04/19/2020   Sepsis (HCC) 07/05/2019   CAP (community  acquired pneumonia) 07/05/2019   Gastroenteritis 07/05/2019   DM type 2 with diabetic mixed hyperlipidemia (HCC) 04/30/2018   Elevated serum creatinine 10/16/2017   Rheumatoid arthritis involving multiple sites with positive rheumatoid factor (HCC) 08/13/2016   Hyperlipidemia, mixed 04/18/2016   Gross hematuria 11/01/2015   History of colon cancer 10/18/2014   Squamous cell metaplasia of urinary bladder 04/29/2014   Osteoarthritis of knee 04/01/2014   B-complex deficiency 03/03/2014   Benign essential hypertension 01/16/2014   Unspecified thoracic, thoracolumbar and lumbosacral intervertebral disc disorder 01/16/2014   Malignant neoplasm of colon (HCC) 01/16/2014   Bladder neoplasm of uncertain malignant potential 09/14/2013   Nocturia 08/05/2013   Idiopathic scoliosis and kyphoscoliosis 06/24/2013   Chronic cystitis 05/04/2013   Increased frequency of urination 05/04/2013   Incomplete emptying of bladder 05/04/2013   PCP:  Cleotilde Oneil FALCON, MD Pharmacy:   [facility] [phone] [address], [place] - [address] AT [facility] [address] [place] [address] Phone: [phone] Fax: [phone]     Social Drivers of Health (SDOH) Social History: SDOH Screenings   Food Insecurity: No Food Insecurity (03/22/2024)  Housing: High Risk (03/22/2024)  Transportation Needs: No Transportation Needs (03/22/2024)  Utilities: Not At Risk (03/22/2024)  Depression (PHQ2-9):  Low Risk  (03/27/2023)  Financial Resource Strain: Low Risk  (09/05/2023)   Received from [facility]  Social Connections: Patient Declined (03/22/2024)  Tobacco Use: Low Risk  (03/22/2024)  Recent Concern: Tobacco Use - Medium Risk (02/26/2024)   Received from [facility]   SDOH Interventions:     Readmission Risk Interventions    03/24/2024    4:33 PM 05/02/2022   10:33 AM 04/29/2022    9:58 AM  Readmission Risk Prevention Plan  Transportation Screening    Complete  PCP or Specialist Appt within 3-5 Days Complete  Complete  HRI or Home Care Consult   Complete  Social Work Consult for Recovery Care Planning/Counseling Complete  Complete  Palliative Care Screening Not Applicable Complete Not Applicable  Medication Review Oceanographer)   Complete

## 2024-03-24 NOTE — Plan of Care (Signed)
  Problem: Education: Goal: Ability to describe self-care measures that may prevent or decrease complications (Diabetes Survival Skills Education) will improve Outcome: Progressing Goal: Individualized Educational Video(s) Outcome: Progressing   Problem: Coping: Goal: Ability to adjust to condition or change in health will improve Outcome: Progressing   Problem: Fluid Volume: Goal: Ability to maintain a balanced intake and output will improve Outcome: Progressing   Problem: Health Behavior/Discharge Planning: Goal: Ability to identify and utilize available resources and services will improve Outcome: Progressing Goal: Ability to manage health-related needs will improve Outcome: Progressing   Problem: Nutritional: Goal: Maintenance of adequate nutrition will improve Outcome: Progressing Goal: Progress toward achieving an optimal weight will improve Outcome: Progressing   Problem: Metabolic: Goal: Ability to maintain appropriate glucose levels will improve Outcome: Progressing   Problem: Skin Integrity: Goal: Risk for impaired skin integrity will decrease Outcome: Progressing   Problem: Fluid Volume: Goal: Hemodynamic stability will improve Outcome: Progressing   Problem: Clinical Measurements: Goal: Diagnostic test results will improve Outcome: Progressing Goal: Signs and symptoms of infection will decrease Outcome: Progressing   Problem: Respiratory: Goal: Ability to maintain adequate ventilation will improve Outcome: Progressing

## 2024-03-24 NOTE — Progress Notes (Addendum)
 PROGRESS NOTE  Stephanie [name]  FMW:[id number] DOB: Feb 23, 1941 DOA: 03/22/2024 PCP: Cleotilde Oneil FALCON, MD  Consultants  Brief Narrative: 83 y.o. female with a PMH significant forstage 4 CKD with anemia, afib not on AC, chronic urinary retention with foley, remote colon CA, DM, HLD, HTN, RA, and SBO who presented on 6/22 with generalized weakness. She reports that she came in because they made me.  She was so weak that she couldn't get from the bed to the couch.  She hardly ate at all yesterday.  She has been complaining that I feel terrible.  General malaise when asked further.  No obvious urinary symptoms.  No fever.  She has been tired all the time with poor appetite for several weeks but it was clearly worse today.   Assessment & Plan: Sepsis due to UTI Resolved sepsis criteria.  WBC improving.   Suspected source is complicated UTI (indwelling foley).  Reported numerous UTIs over the past several months to years Urine culture now growing E. coli resistant to multiple antibiotics.  She is currently on IV meropenem . ID consulted, greatly appreciate input. - May need replacement of ureteral stent in light of numerous UTIs.  Staph bacteremia: - noted blood culture 1/2 bottles - different organism (gram +) than has been noted prior UTIs - consulting ID due to staph bacteremia--> question if this is contaminant.  Regardless she is improving since admission from a overall clinical standpoint. - MRSA swab negative   H/o urinary retention and hydronephrosis Foley exchanged in the ER Hydronephrosis appears to be improved from prior s/p stent Stent appears to be in good position on imaging--> as above may need to have stent replaced.  ID discussing with urology.  Constipation: - patient reports liquid stools usually.   -Had multiple bowel movements yesterday evening.  Continue as needed bowel regimen.    Transaminitis:  - elevated LFTs, AST>ALT--> on admission but now resolved  Stage 4-5  CKD GFR 14-16 throughout 2025 Appears to be stable at this time, around baseline creatinine from past years old. Attempt to avoid nephrotoxic medications Hold torsemide for now Recheck BMP in AM    Anemia of CKD Hgb stable at 9.3 No evidence of bleeding at this time   DM2 Last A1c was 7.2, reasonable control Hold glipizide , consider alternative therapy as an outpatient given its propensity for hypoglycemia in the elderly Cover with sensitive-scale SSI Carb modified diet   Metabolic acidosis. -Most likely secondary to chronic kidney disease.  Will add bicarb to prevent further bone mineral density loss from ongoing acidosis.  Recommend nephrology referral outpatient  HLD Continue rosuvastatin    HTN Slowly creeping upwards and now consistently in 180 range.   - increased home diltiazem  to 300 mg.  Add diuretic back.  On all of her home BP meds.  Has remained 180s range despite this - hydralazine  IV PRN systolics >180s - kidney function precludes use for RAS agents - will start clonidine  0.1 mg, can titrate up as needed.    RA On leflunomide  Will hold in the setting of active infection   COPD  Continue Advair (Breo per formulary) Prn Albuterol    Glaucoma Continue brimonidine     DVT prophylaxis:  enoxaparin  (LOVENOX ) injection 30 mg Start: 03/22/24 2200  Code Status:   Code Status: Full Code Family Communication: Husband at bedside, all questions answered Level of care: Telemetry Medical Status is: Inpatient Dispo:  pending clinical improvement.   Consults called: ID   Subjective: Patient feels better today  than she has.  Had multiple large bowel movements yesterday evening.  Appetite now better.  She is able to eat both breakfast and lunch today.  Denies fevers.  Objective: Vitals:   03/23/24 1821 03/23/24 2035 03/24/24 0440 03/24/24 0739  BP: (!) 180/72 (!) 188/68 (!) 187/64 (!) 182/67  Pulse:  69 64 69  Resp:  20 17 18   Temp:  98.7 F (37.1 C) 98.2 F  (36.8 C) 98.6 F (37 C)  TempSrc:  Oral Oral Oral  SpO2:  94% 94% 96%  Weight:      Height:        Intake/Output Summary (Last 24 hours) at 03/24/2024 1527 Last data filed at 03/24/2024 0931 Gross per 24 hour  Intake 320 ml  Output 850 ml  Net -530 ml   Filed Weights   03/22/24 1000 03/22/24 1414  Weight: 69.4 kg 69.4 kg   Body mass index is 24.69 kg/m.  Gen: 83 y.o. female in no apparent distress.  Nontoxic, elderly, frail appearing.   Pulm: Non-labored breathing.  Clear to auscultation bilaterally.  CV: Regular rate and rhythm. No murmur, rub, or gallop. No JVD GI: Abdomen soft.  Minimal distention on exam today, lower quadrant tenderness now resolved. Ext: Warm, no deformities, +3 LE edema on Left, +2 on right, each to mid-shin Skin: No rashes, lesions  Neuro: Alert and oriented. No focal neurological deficits. Psych: Calm  Judgement and insight appear normal. Mood & affect appropriate.     I have personally reviewed the following labs and images: CBC: Recent Labs  Lab 03/22/24 0850 03/23/24 0329 03/24/24 0409  WBC 18.3* 11.1* 12.6*  NEUTROABS 14.4*  --   --   HGB 9.3* 8.1* 9.8*  HCT 30.8* 27.2* 32.4*  MCV 97.2 96.1 96.1  PLT 208 166 203   BMP &GFR Recent Labs  Lab 03/22/24 0850 03/23/24 0329 03/24/24 0409  NA 134* 134* 136  K 5.1 4.7 3.9  CL 105 108 109  CO2 20* 20* 19*  GLUCOSE 191* 100* 124*  BUN 53* 49* 46*  CREATININE 3.32* 2.86* 2.87*  CALCIUM  8.4* 8.5* 8.3*   Estimated Creatinine Clearance: 14.4 mL/min (A) (by C-G formula based on SCr of 2.87 mg/dL (H)). Liver & Pancreas: Recent Labs  Lab 03/22/24 0850 03/24/24 0409  AST 127* 18  ALT 87* 33  ALKPHOS 108 86  BILITOT 0.8 0.8  PROT 5.5* 5.5*  ALBUMIN  3.1* 2.7*   Recent Labs  Lab 03/22/24 0850  LIPASE 28   No results for input(s): AMMONIA in the last 168 hours. Diabetic: No results for input(s): HGBA1C in the last 72 hours. Recent Labs  Lab 03/23/24 1150 03/23/24 1615  03/23/24 2152 03/24/24 0738 03/24/24 1141  GLUCAP 131* 133* 109* 130* 193*   Cardiac Enzymes: No results for input(s): CKTOTAL, CKMB, CKMBINDEX, TROPONINI in the last 168 hours. No results for input(s): PROBNP in the last 8760 hours. Coagulation Profile: No results for input(s): INR, PROTIME in the last 168 hours. Thyroid Function Tests: No results for input(s): TSH, T4TOTAL, FREET4, T3FREE, THYROIDAB in the last 72 hours. Lipid Profile: No results for input(s): CHOL, HDL, LDLCALC, TRIG, CHOLHDL, LDLDIRECT in the last 72 hours. Anemia Panel: No results for input(s): VITAMINB12, FOLATE, FERRITIN, TIBC, IRON , RETICCTPCT in the last 72 hours. Urine analysis:    Component Value Date/Time   COLORURINE YELLOW (A) 03/22/2024 0850   APPEARANCEUR TURBID (A) 03/22/2024 0850   APPEARANCEUR Cloudy (A) 11/18/2023 1132   LABSPEC 1.016 03/22/2024 0850  LABSPEC 1.013 01/04/2015 1239   PHURINE 5.0 03/22/2024 0850   GLUCOSEU NEGATIVE 03/22/2024 0850   GLUCOSEU Negative 01/04/2015 1239   HGBUR NEGATIVE 03/22/2024 0850   BILIRUBINUR NEGATIVE 03/22/2024 0850   BILIRUBINUR Negative 11/18/2023 1132   BILIRUBINUR Negative 01/04/2015 1239   KETONESUR NEGATIVE 03/22/2024 0850   PROTEINUR >=300 (A) 03/22/2024 0850   NITRITE NEGATIVE 03/22/2024 0850   LEUKOCYTESUR MODERATE (A) 03/22/2024 0850   LEUKOCYTESUR 2+ 01/04/2015 1239   Sepsis Labs: Invalid input(s): PROCALCITONIN, LACTICIDVEN  Microbiology: Recent Results (from the past 240 hours)  Resp panel by RT-PCR (RSV, Flu A&B, Covid) Anterior Nasal Swab     Status: None   Collection Time: 03/22/24  8:50 AM   Specimen: Anterior Nasal Swab  Result Value Ref Range Status   SARS Coronavirus 2 by RT PCR NEGATIVE NEGATIVE Final    Comment: (NOTE) SARS-CoV-2 target nucleic acids are NOT DETECTED.  The SARS-CoV-2 RNA is generally detectable in upper respiratory specimens during the acute phase of  infection. The lowest concentration of SARS-CoV-2 viral copies this assay can detect is 138 copies/mL. A negative result does not preclude SARS-Cov-2 infection and should not be used as the sole basis for treatment or other patient management decisions. A negative result may occur with  improper specimen collection/handling, submission of specimen other than nasopharyngeal swab, presence of viral mutation(s) within the areas targeted by this assay, and inadequate number of viral copies(<138 copies/mL). A negative result must be combined with clinical observations, patient history, and epidemiological information. The expected result is Negative.  Fact Sheet for Patients:  BloggerCourse.com  Fact Sheet for Healthcare Providers:  SeriousBroker.it  This test is no t yet approved or cleared by the [place]  FDA and  has been authorized for detection and/or diagnosis of SARS-CoV-2 by FDA under an Emergency Use Authorization (EUA). This EUA will remain  in effect (meaning this test can be used) for the duration of the COVID-19 declaration under Section 564(b)(1) of the Act, 21 U.S.C.section 360bbb-3(b)(1), unless the authorization is terminated  or revoked sooner.       Influenza A by PCR NEGATIVE NEGATIVE Final   Influenza B by PCR NEGATIVE NEGATIVE Final    Comment: (NOTE) The Xpert Xpress SARS-CoV-2/FLU/RSV plus assay is intended as an aid in the diagnosis of influenza from Nasopharyngeal swab specimens and should not be used as a sole basis for treatment. Nasal washings and aspirates are unacceptable for Xpert Xpress SARS-CoV-2/FLU/RSV testing.  Fact Sheet for Patients: BloggerCourse.com  Fact Sheet for Healthcare Providers: SeriousBroker.it  This test is not yet approved or cleared by the [place]  FDA and has been authorized for detection and/or diagnosis of SARS-CoV-2  by FDA under an Emergency Use Authorization (EUA). This EUA will remain in effect (meaning this test can be used) for the duration of the COVID-19 declaration under Section 564(b)(1) of the Act, 21 U.S.C. section 360bbb-3(b)(1), unless the authorization is terminated or revoked.     Resp Syncytial Virus by PCR NEGATIVE NEGATIVE Final    Comment: (NOTE) Fact Sheet for Patients: BloggerCourse.com  Fact Sheet for Healthcare Providers: SeriousBroker.it  This test is not yet approved or cleared by the [place]  FDA and has been authorized for detection and/or diagnosis of SARS-CoV-2 by FDA under an Emergency Use Authorization (EUA). This EUA will remain in effect (meaning this test can be used) for the duration of the COVID-19 declaration under Section 564(b)(1) of the Act, 21 U.S.C. section 360bbb-3(b)(1), unless the authorization is terminated or  revoked.  Performed at [facility], [address], [place], [place] [address]   Urine Culture (for pregnant, neutropenic or urologic patients or patients with an indwelling urinary catheter)     Status: Abnormal   Collection Time: 03/22/24  8:50 AM   Specimen: Urine, Catheterized  Result Value Ref Range Status   Specimen Description   Final    URINE, CATHETERIZED Performed at [facility], [address] [place], [place] [address]    Special Requests   Final    NONE Performed at [facility], [address] [place], [place] [address]    Culture >=100,000 COLONIES/mL ESCHERICHIA COLI (A)  Final   Report Status 03/24/2024 FINAL  Final   Organism ID, Bacteria ESCHERICHIA COLI (A)  Final      Susceptibility   Escherichia coli - MIC*    AMPICILLIN >=32 RESISTANT Resistant     CEFAZOLIN  RESISTANT Resistant     CEFEPIME  <=0.12 SENSITIVE Sensitive     CEFTRIAXONE  <=0.25 SENSITIVE Sensitive     CIPROFLOXACIN  >=4 RESISTANT Resistant     GENTAMICIN <=1  SENSITIVE Sensitive     IMIPENEM <=0.25 SENSITIVE Sensitive     NITROFURANTOIN  <=16 SENSITIVE Sensitive     TRIMETH /SULFA  >=320 RESISTANT Resistant     AMPICILLIN/SULBACTAM >=32 RESISTANT Resistant     PIP/TAZO 16 SENSITIVE Sensitive ug/mL    * >=100,000 COLONIES/mL ESCHERICHIA COLI  Blood culture (routine x 2)     Status: None (Preliminary result)   Collection Time: 03/22/24  9:38 AM   Specimen: BLOOD  Result Value Ref Range Status   Specimen Description   Final    BLOOD LEFT ANTECUBITAL Performed at [facility], [address] [place], [place] [address]    Special Requests   Final    BOTTLES DRAWN AEROBIC AND ANAEROBIC Blood Culture results may not be optimal due to an inadequate volume of blood received in culture bottles Performed at [facility], [address] [place], [place] [address]    Culture  Setup Time   Final    GRAM POSITIVE COCCI AEROBIC BOTTLE ONLY CRITICAL RESULT CALLED TO, READ BACK BY AND VERIFIED WITH: JASON ROBBINS 03/23/24 0445 MW Performed at [facility], [address], [place], [place] [address]    Culture GRAM POSITIVE COCCI  Final   Report Status PENDING  Incomplete  Blood culture (routine x 2)     Status: None (Preliminary result)   Collection Time: 03/22/24  9:38 AM   Specimen: BLOOD  Result Value Ref Range Status   Specimen Description BLOOD BLOOD LEFT ARM  Final   Special Requests   Final    BOTTLES DRAWN AEROBIC AND ANAEROBIC Blood Culture adequate volume   Culture   Final    NO GROWTH 2 DAYS Performed at [facility], [address] [place], [place] [address]    Report Status PENDING  Incomplete  Blood Culture ID Panel (Reflexed)     Status: Abnormal   Collection Time: 03/22/24  9:38 AM  Result Value Ref Range Status   Enterococcus faecalis NOT DETECTED NOT DETECTED Final   Enterococcus Faecium NOT DETECTED NOT DETECTED Final   Listeria monocytogenes NOT DETECTED NOT DETECTED Final   Staphylococcus  species DETECTED (A) NOT DETECTED Final    Comment: CRITICAL RESULT CALLED TO, READ BACK BY AND VERIFIED WITH: JASON ROBBINS 03/23/24 0445 MW    Staphylococcus aureus (BCID) NOT DETECTED NOT DETECTED Final   Staphylococcus epidermidis NOT DETECTED  NOT DETECTED Final   Staphylococcus lugdunensis NOT DETECTED NOT DETECTED Final   Streptococcus species NOT DETECTED NOT DETECTED Final   Streptococcus agalactiae NOT DETECTED NOT DETECTED Final   Streptococcus pneumoniae NOT DETECTED NOT DETECTED Final   Streptococcus pyogenes NOT DETECTED NOT DETECTED Final   A.calcoaceticus-baumannii NOT DETECTED NOT DETECTED Final   Bacteroides fragilis NOT DETECTED NOT DETECTED Final   Enterobacterales NOT DETECTED NOT DETECTED Final   Enterobacter cloacae complex NOT DETECTED NOT DETECTED Final   Escherichia coli NOT DETECTED NOT DETECTED Final   Klebsiella aerogenes NOT DETECTED NOT DETECTED Final   Klebsiella oxytoca NOT DETECTED NOT DETECTED Final   Klebsiella pneumoniae NOT DETECTED NOT DETECTED Final   Proteus species NOT DETECTED NOT DETECTED Final   Salmonella species NOT DETECTED NOT DETECTED Final   Serratia marcescens NOT DETECTED NOT DETECTED Final   Haemophilus influenzae NOT DETECTED NOT DETECTED Final   Neisseria meningitidis NOT DETECTED NOT DETECTED Final   Pseudomonas aeruginosa NOT DETECTED NOT DETECTED Final   Stenotrophomonas maltophilia NOT DETECTED NOT DETECTED Final   Candida albicans NOT DETECTED NOT DETECTED Final   Candida auris NOT DETECTED NOT DETECTED Final   Candida glabrata NOT DETECTED NOT DETECTED Final   Candida krusei NOT DETECTED NOT DETECTED Final   Candida parapsilosis NOT DETECTED NOT DETECTED Final   Candida tropicalis NOT DETECTED NOT DETECTED Final   Cryptococcus neoformans/gattii NOT DETECTED NOT DETECTED Final    Comment: Performed at [facility], [address] [place], [place] [address]  MRSA Next Gen by PCR, Nasal     Status: None    Collection Time: 03/23/24  9:20 AM   Specimen: Nasal Mucosa; Nasal Swab  Result Value Ref Range Status   MRSA by PCR Next Gen NOT DETECTED NOT DETECTED Final    Comment: (NOTE) The GeneXpert MRSA Assay (FDA approved for NASAL specimens only), is one component of a comprehensive MRSA colonization surveillance program. It is not intended to diagnose MRSA infection nor to guide or monitor treatment for MRSA infections. Test performance is not FDA approved in patients less than 68 years old. Performed at [facility], [address] [place], [place] [address]   Culture, blood (single) w Reflex to ID Panel     Status: None (Preliminary result)   Collection Time: 03/23/24  4:45 PM   Specimen: BLOOD  Result Value Ref Range Status   Specimen Description BLOOD BLOOD RIGHT HAND  Final   Special Requests   Final    BOTTLES DRAWN AEROBIC AND ANAEROBIC Blood Culture adequate volume   Culture   Final    NO GROWTH < 24 HOURS Performed at [facility], [address], [place], [place] [address]    Report Status PENDING  Incomplete    Radiology Studies: No results found.  Scheduled Meds:  brimonidine   1 drop Both Eyes BID   Chlorhexidine  Gluconate Cloth  6 each Topical Daily   diltiazem   300 mg Oral Daily   docusate sodium  100 mg Oral BID   enoxaparin  (LOVENOX ) injection  30 mg Subcutaneous Q24H   hydrALAZINE   50 mg Oral Q8H   insulin  aspart  0-9 Units Subcutaneous TID WC   metoCLOPramide  5 mg Oral BID   metoprolol  tartrate  50 mg Oral BID   pantoprazole   40 mg Oral Daily   polyethylene glycol  17 g Oral Daily   rosuvastatin   10 mg Oral Daily   sodium chloride  flush  3 mL Intravenous Q12H  torsemide  10 mg Oral Daily   Continuous Infusions:  cefTRIAXone  (ROCEPHIN )  IV       LOS: 2 days   35 minutes with more than 50% spent in reviewing records, counseling patient/family and coordinating care.  Reyes VEAR Gaw, MD [facility] [email or web address] 03/24/2024, 3:27 PM

## 2024-03-24 NOTE — Progress Notes (Signed)
 INFECTIOUS DISEASE PROGRESS NOTE Date of Admission:  03/22/2024     ID: Stephanie [name] is a 83 y.o. female with UTI, bacteremia Principal Problem:   Sepsis secondary to UTI [facility]) Active Problems:   DM type 2 with diabetic mixed hyperlipidemia (HCC)   Rheumatoid arthritis involving multiple sites with positive rheumatoid factor (HCC)   Pure hypercholesterolemia   CKD (chronic kidney disease) stage 5, GFR less than 15 ml/min (HCC)   UPJ obstruction, acquired   Type II diabetes mellitus with renal manifestations (HCC)   Anemia due to stage 4 chronic kidney disease (HCC)   Subjective: No fevers wbc nml. Per husband a little more interactive today   ROS  Eleven systems are reviewed and negative except per hpi  Medications:  Antibiotics Given (last 72 hours)     Date/Time Action Medication Dose Rate   03/22/24 1053 New Bag/Given   meropenem  (MERREM ) 500 mg in sodium chloride  0.9 % 100 mL IVPB 500 mg 200 mL/hr   03/22/24 2210 New Bag/Given   meropenem  (MERREM ) 500 mg in sodium chloride  0.9 % 100 mL IVPB 500 mg 200 mL/hr   03/23/24 0924 New Bag/Given   meropenem  (MERREM ) 500 mg in sodium chloride  0.9 % 100 mL IVPB 500 mg 200 mL/hr   03/23/24 2212 New Bag/Given   meropenem  (MERREM ) 500 mg in sodium chloride  0.9 % 100 mL IVPB 500 mg 200 mL/hr   03/24/24 1113 New Bag/Given   meropenem  (MERREM ) 500 mg in sodium chloride  0.9 % 100 mL IVPB 500 mg 200 mL/hr       brimonidine   1 drop Both Eyes BID   Chlorhexidine  Gluconate Cloth  6 each Topical Daily   diltiazem   300 mg Oral Daily   docusate sodium  100 mg Oral BID   enoxaparin  (LOVENOX ) injection  30 mg Subcutaneous Q24H   hydrALAZINE   50 mg Oral Q8H   insulin  aspart  0-9 Units Subcutaneous TID WC   metoCLOPramide  5 mg Oral BID   metoprolol  tartrate  50 mg Oral BID   pantoprazole   40 mg Oral Daily   polyethylene glycol  17 g Oral Daily   rosuvastatin   10 mg Oral Daily   sodium chloride  flush  3 mL Intravenous Q12H   torsemide  10  mg Oral Daily    Objective: Vital signs in last 24 hours: Temp:  [97.9 F (36.6 C)-98.7 F (37.1 C)] 98.6 F (37 C) (06/24 0739) Pulse Rate:  [64-69] 69 (06/24 0739) Resp:  [16-20] 18 (06/24 0739) BP: (179-188)/(64-72) 182/67 (06/24 0739) SpO2:  [94 %-96 %] 96 % (06/24 0739) GEN sleeping in bed RRR CTAB Foley in place  Lab Results Recent Labs    03/23/24 0329 03/24/24 0409  WBC 11.1* 12.6*  HGB 8.1* 9.8*  HCT 27.2* 32.4*  NA 134* 136  K 4.7 3.9  CL 108 109  CO2 20* 19*  BUN 49* 46*  CREATININE 2.86* 2.87*    Microbiology: Results for orders placed or performed during the hospital encounter of 03/22/24  Resp panel by RT-PCR (RSV, Flu A&B, Covid) Anterior Nasal Swab     Status: None   Collection Time: 03/22/24  8:50 AM   Specimen: Anterior Nasal Swab  Result Value Ref Range Status   SARS Coronavirus 2 by RT PCR NEGATIVE NEGATIVE Final    Comment: (NOTE) SARS-CoV-2 target nucleic acids are NOT DETECTED.  The SARS-CoV-2 RNA is generally detectable in upper respiratory specimens during the acute phase of infection. The lowest  concentration of SARS-CoV-2 viral copies this assay can detect is 138 copies/mL. A negative result does not preclude SARS-Cov-2 infection and should not be used as the sole basis for treatment or other patient management decisions. A negative result may occur with  improper specimen collection/handling, submission of specimen other than nasopharyngeal swab, presence of viral mutation(s) within the areas targeted by this assay, and inadequate number of viral copies(<138 copies/mL). A negative result must be combined with clinical observations, patient history, and epidemiological information. The expected result is Negative.  Fact Sheet for Patients:  BloggerCourse.com  Fact Sheet for Healthcare Providers:  SeriousBroker.it  This test is no t yet approved or cleared by the [place]  FDA  and  has been authorized for detection and/or diagnosis of SARS-CoV-2 by FDA under an Emergency Use Authorization (EUA). This EUA will remain  in effect (meaning this test can be used) for the duration of the COVID-19 declaration under Section 564(b)(1) of the Act, 21 U.S.C.section 360bbb-3(b)(1), unless the authorization is terminated  or revoked sooner.       Influenza A by PCR NEGATIVE NEGATIVE Final   Influenza B by PCR NEGATIVE NEGATIVE Final    Comment: (NOTE) The Xpert Xpress SARS-CoV-2/FLU/RSV plus assay is intended as an aid in the diagnosis of influenza from Nasopharyngeal swab specimens and should not be used as a sole basis for treatment. Nasal washings and aspirates are unacceptable for Xpert Xpress SARS-CoV-2/FLU/RSV testing.  Fact Sheet for Patients: BloggerCourse.com  Fact Sheet for Healthcare Providers: SeriousBroker.it  This test is not yet approved or cleared by the [place]  FDA and has been authorized for detection and/or diagnosis of SARS-CoV-2 by FDA under an Emergency Use Authorization (EUA). This EUA will remain in effect (meaning this test can be used) for the duration of the COVID-19 declaration under Section 564(b)(1) of the Act, 21 U.S.C. section 360bbb-3(b)(1), unless the authorization is terminated or revoked.     Resp Syncytial Virus by PCR NEGATIVE NEGATIVE Final    Comment: (NOTE) Fact Sheet for Patients: BloggerCourse.com  Fact Sheet for Healthcare Providers: SeriousBroker.it  This test is not yet approved or cleared by the [place]  FDA and has been authorized for detection and/or diagnosis of SARS-CoV-2 by FDA under an Emergency Use Authorization (EUA). This EUA will remain in effect (meaning this test can be used) for the duration of the COVID-19 declaration under Section 564(b)(1) of the Act, 21 U.S.C. section 360bbb-3(b)(1),  unless the authorization is terminated or revoked.  Performed at [facility], [address], [place], [place] [address]   Urine Culture (for pregnant, neutropenic or urologic patients or patients with an indwelling urinary catheter)     Status: Abnormal   Collection Time: 03/22/24  8:50 AM   Specimen: Urine, Catheterized  Result Value Ref Range Status   Specimen Description   Final    URINE, CATHETERIZED Performed at [facility], [address], [place], [place] [address]    Special Requests   Final    NONE Performed at [facility], [address] [place], [place] [address]    Culture >=100,000 COLONIES/mL ESCHERICHIA COLI (A)  Final   Report Status 03/24/2024 FINAL  Final   Organism ID, Bacteria ESCHERICHIA COLI (A)  Final      Susceptibility   Escherichia coli - MIC*    AMPICILLIN >=32 RESISTANT Resistant     CEFAZOLIN  RESISTANT Resistant     CEFEPIME  <=0.12 SENSITIVE Sensitive     CEFTRIAXONE  <=0.25 SENSITIVE Sensitive  CIPROFLOXACIN  >=4 RESISTANT Resistant     GENTAMICIN <=1 SENSITIVE Sensitive     IMIPENEM <=0.25 SENSITIVE Sensitive     NITROFURANTOIN  <=16 SENSITIVE Sensitive     TRIMETH /SULFA  >=320 RESISTANT Resistant     AMPICILLIN/SULBACTAM >=32 RESISTANT Resistant     PIP/TAZO 16 SENSITIVE Sensitive ug/mL    * >=100,000 COLONIES/mL ESCHERICHIA COLI  Blood culture (routine x 2)     Status: None (Preliminary result)   Collection Time: 03/22/24  9:38 AM   Specimen: BLOOD  Result Value Ref Range Status   Specimen Description   Final    BLOOD LEFT ANTECUBITAL Performed at [facility], [address] [place], [place] [address]    Special Requests   Final    BOTTLES DRAWN AEROBIC AND ANAEROBIC Blood Culture results may not be optimal due to an inadequate volume of blood received in culture bottles Performed at [facility], [address] [place], [place] [address]    Culture  Setup Time   Final    GRAM  POSITIVE COCCI AEROBIC BOTTLE ONLY CRITICAL RESULT CALLED TO, READ BACK BY AND VERIFIED WITH: JASON ROBBINS 03/23/24 0445 MW Performed at [facility], [address] [place], [place] [address]    Culture GRAM POSITIVE COCCI  Final   Report Status PENDING  Incomplete  Blood culture (routine x 2)     Status: None (Preliminary result)   Collection Time: 03/22/24  9:38 AM   Specimen: BLOOD  Result Value Ref Range Status   Specimen Description BLOOD BLOOD LEFT ARM  Final   Special Requests   Final    BOTTLES DRAWN AEROBIC AND ANAEROBIC Blood Culture adequate volume   Culture   Final    NO GROWTH 2 DAYS Performed at [facility], [address], [place], [place] [address]    Report Status PENDING  Incomplete  Blood Culture ID Panel (Reflexed)     Status: Abnormal   Collection Time: 03/22/24  9:38 AM  Result Value Ref Range Status   Enterococcus faecalis NOT DETECTED NOT DETECTED Final   Enterococcus Faecium NOT DETECTED NOT DETECTED Final   Listeria monocytogenes NOT DETECTED NOT DETECTED Final   Staphylococcus species DETECTED (A) NOT DETECTED Final    Comment: CRITICAL RESULT CALLED TO, READ BACK BY AND VERIFIED WITH: JASON ROBBINS 03/23/24 0445 MW    Staphylococcus aureus (BCID) NOT DETECTED NOT DETECTED Final   Staphylococcus epidermidis NOT DETECTED NOT DETECTED Final   Staphylococcus lugdunensis NOT DETECTED NOT DETECTED Final   Streptococcus species NOT DETECTED NOT DETECTED Final   Streptococcus agalactiae NOT DETECTED NOT DETECTED Final   Streptococcus pneumoniae NOT DETECTED NOT DETECTED Final   Streptococcus pyogenes NOT DETECTED NOT DETECTED Final   A.calcoaceticus-baumannii NOT DETECTED NOT DETECTED Final   Bacteroides fragilis NOT DETECTED NOT DETECTED Final   Enterobacterales NOT DETECTED NOT DETECTED Final   Enterobacter cloacae complex NOT DETECTED NOT DETECTED Final   Escherichia coli NOT DETECTED NOT DETECTED Final   Klebsiella aerogenes NOT  DETECTED NOT DETECTED Final   Klebsiella oxytoca NOT DETECTED NOT DETECTED Final   Klebsiella pneumoniae NOT DETECTED NOT DETECTED Final   Proteus species NOT DETECTED NOT DETECTED Final   Salmonella species NOT DETECTED NOT DETECTED Final   Serratia marcescens NOT DETECTED NOT DETECTED Final   Haemophilus influenzae NOT DETECTED NOT DETECTED Final   Neisseria meningitidis NOT DETECTED NOT DETECTED Final   Pseudomonas aeruginosa NOT DETECTED NOT DETECTED Final   Stenotrophomonas maltophilia NOT DETECTED  NOT DETECTED Final   Candida albicans NOT DETECTED NOT DETECTED Final   Candida auris NOT DETECTED NOT DETECTED Final   Candida glabrata NOT DETECTED NOT DETECTED Final   Candida krusei NOT DETECTED NOT DETECTED Final   Candida parapsilosis NOT DETECTED NOT DETECTED Final   Candida tropicalis NOT DETECTED NOT DETECTED Final   Cryptococcus neoformans/gattii NOT DETECTED NOT DETECTED Final    Comment: Performed at [facility], [address] [place], [place] [address]  MRSA Next Gen by PCR, Nasal     Status: None   Collection Time: 03/23/24  9:20 AM   Specimen: Nasal Mucosa; Nasal Swab  Result Value Ref Range Status   MRSA by PCR Next Gen NOT DETECTED NOT DETECTED Final    Comment: (NOTE) The GeneXpert MRSA Assay (FDA approved for NASAL specimens only), is one component of a comprehensive MRSA colonization surveillance program. It is not intended to diagnose MRSA infection nor to guide or monitor treatment for MRSA infections. Test performance is not FDA approved in patients less than 28 years old. Performed at [facility], [address] [place], [place] [address]   Culture, blood (single) w Reflex to ID Panel     Status: None (Preliminary result)   Collection Time: 03/23/24  4:45 PM   Specimen: BLOOD  Result Value Ref Range Status   Specimen Description BLOOD BLOOD RIGHT HAND  Final   Special Requests   Final    BOTTLES DRAWN AEROBIC AND ANAEROBIC Blood  Culture adequate volume   Culture   Final    NO GROWTH < 24 HOURS Performed at [facility], [address], [place], [place] [address]    Report Status PENDING  Incomplete    Studies/Results: No results found.  Assessment/Plan: HALY FEHER is a 83 y.o. 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  In the ED she was found to have no fever but a white count of 18.  Lactic acid was elevated at 2.  Creatinine was around her baseline 3.  Urine sample showed moderate leukocyte esterase greater than 50 whites urine cultures pending. She had a CT scan done that showed stent in place on the right side nephroureteral stent but no signs of high-grade obstructive uropathy.  There was desiccated stool large volume and distended rectum.  Chest x-ray was normal.  Lower extremity Doppler was negative. Since then blood cultures have returned positive with 1 of 2 growing gram-positive cocci identified as staph species not Staph aureus. UCX > 100 K GNR She also has progressive decline per her husband over last few weeks. Has progressive anemia.      6/24- Ucx with E coli, relatively sensitive. BCX ID pending  Recommendations UTI - in pt with chronic foley and nephrouteral stent chronically. She has complicated history and has had stent in place and chronic foley for several years. Per her husband was on chronic suppressive abx several years ago with Dr Ike but since having foley cath placed has only had a few UTIs and not on recent abx Today will change meropenem  to ceftriaxone  since no oral options. Will likely need 10 days treatment for complicated UTI given stent and foley in place and high risk of recurrences.  Discussed with husband Messaged urology re considering changing stent once we sterilize the urine vs trying to treat her through and only change stent if recurs   Bacteremia- 1/2 sets with non  staph aureus staph species. I suspect contaminant. No pacemaker or lines. Repeat bcx NGTD .    Possible impaction- noted on CT = husband says has BMs every few days. Consider bowel regimen.   Progressive decline at home for last several weeks   Progressive anemia - unclear etiology.    Thank you very much for the consult. Will follow with you.  Alm SHAUNNA Needle   03/24/2024, 1:34 PM

## 2024-03-24 NOTE — NC FL2 (Signed)
 [place]  MEDICAID FL2 LEVEL OF CARE FORM     IDENTIFICATION  Patient Name: Stephanie [name] Birthdate: [birth year] Sex: female Admission Date (Current Location): 03/22/2024  [facility] and [place] Number:  Chiropodist and Address:  [facility], [address] [place], [place] [address]      Provider Number: [id number]  Attending Physician Name and Address:  Elpidio Reyes VEAR, MD  Relative Name and Phone Number:       Current Level of Care: Hospital Recommended Level of Care: Skilled Nursing Facility Prior Approval Number:    Date Approved/Denied:   PASRR Number: [id number]  Discharge Plan: SNF    Current Diagnoses: Patient Active Problem List   Diagnosis Date Noted   Anemia due to stage 4 chronic kidney disease (HCC) 05/28/2023   Fever    Decubitus ulcer of coccyx, unspecified pressure ulcer stage 04/29/2022   Infected kidney cyst 04/28/2022   Acute unilateral obstructive uropathy 04/28/2022   Acute renal failure superimposed on stage 3b chronic kidney disease (HCC) 04/28/2022   Dyslipidemia 04/28/2022   GERD without esophagitis 04/28/2022   Hypertensive urgency 04/28/2022   Hypokalemia 03/12/2022   Sepsis secondary to UTI (HCC) 03/09/2022   Indwelling Foley catheter present 03/09/2022   Hyperkalemia 07/25/2021   Hydronephrosis of right kidney 07/25/2021   Type II diabetes mellitus with renal manifestations (HCC)    Symptomatic anemia    Asthma    Acute renal failure superimposed on stage 3a chronic kidney disease (HCC)    UTI (urinary tract infection)    Abnormal computed tomography of large intestine    Polyp of descending colon    S/p exchange of ureteral stent 02/06/22 03/22/2021   Aortic atherosclerosis (HCC) 02/23/2021   CKD (chronic kidney disease) stage 4, GFR 15-29 ml/min (HCC) 02/14/2021   Proteinuria 08/18/2020   UPJ obstruction, acquired 08/18/2020   Anemia secondary to renal failure 07/20/2020   CKD (chronic kidney  disease) stage 5, GFR less than 15 ml/min (HCC)    Ileus (HCC)    Paroxysmal atrial fibrillation (HCC)    Goals of care, counseling/discussion    Palliative care by specialist    SBO (small bowel obstruction) (HCC)    Pressure injury of skin 06/19/2020   Acquired thrombophilia (HCC) 06/19/2020   On continuous oral anticoagulation 06/19/2020   Acute pyelonephritis 06/18/2020   Hydronephrosis, right 06/18/2020   Lactic acid acidosis 06/18/2020   Hyperglycemia 06/18/2020   Hx of essential hypertension 06/18/2020   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   Atrial fibrillation with rapid ventricular response (HCC)    Essential hypertension    Pure hypercholesterolemia    Diabetes mellitus (HCC) 06/17/2020   Rheumatoid arthritis (HCC) 06/17/2020   COVID-19 virus infection 06/17/2020   AKI (acute kidney injury) (HCC) 06/17/2020   Chronic left SI joint pain 04/19/2020   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T9-Sacrum) 04/19/2020   Chronic radicular lumbar pain 04/19/2020   Lumbar spondylosis 04/19/2020   Chronic pain syndrome 04/19/2020   Sepsis (HCC) 07/05/2019   CAP (community acquired pneumonia) 07/05/2019   Gastroenteritis 07/05/2019   DM type 2 with diabetic mixed hyperlipidemia (HCC) 04/30/2018   Elevated serum creatinine 10/16/2017   Rheumatoid arthritis involving multiple sites with positive rheumatoid factor (HCC) 08/13/2016   Hyperlipidemia, mixed 04/18/2016   Gross hematuria 11/01/2015   History of colon cancer 10/18/2014   Squamous cell metaplasia of urinary bladder 04/29/2014   Osteoarthritis of knee 04/01/2014   B-complex deficiency 03/03/2014  Benign essential hypertension 01/16/2014   Unspecified thoracic, thoracolumbar and lumbosacral intervertebral disc disorder 01/16/2014   Malignant neoplasm of colon (HCC) 01/16/2014   Bladder neoplasm of uncertain malignant potential 09/14/2013   Nocturia 08/05/2013   Idiopathic scoliosis and kyphoscoliosis  06/24/2013   Chronic cystitis 05/04/2013   Increased frequency of urination 05/04/2013   Incomplete emptying of bladder 05/04/2013    Orientation RESPIRATION BLADDER Height & Weight     Self, Time, Situation, Place  Normal Continent, Indwelling catheter Weight: 153 lb (69.4 kg) Height:  5' 6 (167.6 cm)  BEHAVIORAL SYMPTOMS/MOOD NEUROLOGICAL BOWEL NUTRITION STATUS   (None)  (None) Incontinent Diet (Carb modified)  AMBULATORY STATUS COMMUNICATION OF NEEDS Skin   Extensive Assist Verbally Bruising                       Personal Care Assistance Level of Assistance  Bathing, Feeding, Dressing Bathing Assistance: Maximum assistance Feeding assistance: Limited assistance Dressing Assistance: Maximum assistance     Functional Limitations Info  Sight, Hearing, Speech Sight Info: Adequate Hearing Info: Impaired Speech Info: Adequate    SPECIAL CARE FACTORS FREQUENCY  PT (By licensed PT), OT (By licensed OT)     PT Frequency: 5 x week OT Frequency: 5 x week            Contractures Contractures Info: Not present    Additional Factors Info  Code Status, Allergies, Isolation Precautions Code Status Info: Full code Allergies Info: Remicade (Infliximab), Ibuprofen, Indomethacin, Methotrexate Derivatives, Moexipril, Percocet (Oxycodone -acetaminophen ), Naprosyn (Naproxen)     Isolation Precautions Info: Contact precautions: ESBL     Current Medications (03/24/2024):  This is the current hospital active medication list Current Facility-Administered Medications  Medication Dose Route Frequency Provider Last Rate Last Admin   acetaminophen  (TYLENOL ) tablet 650 mg  650 mg Oral Q6H PRN Barbarann Nest, MD       Or   acetaminophen  (TYLENOL ) suppository 650 mg  650 mg Rectal Q6H PRN Barbarann Nest, MD       albuterol  (PROVENTIL ) (2.5 MG/3ML) 0.083% nebulizer solution 3 mL  3 mL Nebulization Q6H PRN Barbarann Nest, MD       bisacodyl (DULCOLAX) EC tablet 5 mg  5 mg Oral Daily  PRN Barbarann Nest, MD       brimonidine  (ALPHAGAN ) 0.15 % ophthalmic solution 1 drop  1 drop Both Eyes BID Barbarann Nest, MD   1 drop at 03/24/24 1118   cefTRIAXone  (ROCEPHIN ) 2 g in sodium chloride  0.9 % 100 mL IVPB  2 g Intravenous Q24H Epifanio Alm SQUIBB, MD       Chlorhexidine  Gluconate Cloth 2 % PADS 6 each  6 each Topical Daily Elpidio Reyes DEL, MD   6 each at 03/24/24 1004   diltiazem  (CARDIZEM  CD) 24 hr capsule 300 mg  300 mg Oral Daily Elpidio Reyes DEL, MD   300 mg at 03/24/24 1118   docusate sodium (COLACE) capsule 100 mg  100 mg Oral BID Barbarann Nest, MD   100 mg at 03/24/24 1118   enoxaparin  (LOVENOX ) injection 30 mg  30 mg Subcutaneous Q24H Barbarann Nest, MD   30 mg at 03/23/24 2214   hydrALAZINE  (APRESOLINE ) injection 5 mg  5 mg Intravenous Q4H PRN Barbarann Nest, MD   5 mg at 03/23/24 1620   hydrALAZINE  (APRESOLINE ) tablet 50 mg  50 mg Oral Q8H Yates, Jennifer, MD   50 mg at 03/24/24 1409   insulin  aspart (novoLOG ) injection 0-9 Units  0-9 Units Subcutaneous  TID WC Barbarann Nest, MD   2 Units at 03/24/24 1410   metoCLOPramide (REGLAN) tablet 5 mg  5 mg Oral BID Barbarann Nest, MD   5 mg at 03/24/24 1118   metoprolol  tartrate (LOPRESSOR ) injection 5 mg  5 mg Intravenous Q6H PRN Elpidio Reyes DEL, MD   5 mg at 03/23/24 1819   metoprolol  tartrate (LOPRESSOR ) tablet 50 mg  50 mg Oral BID Barbarann Nest, MD   50 mg at 03/24/24 1118   ondansetron  (ZOFRAN ) tablet 4 mg  4 mg Oral Q6H PRN Barbarann Nest, MD       Or   ondansetron  (ZOFRAN ) injection 4 mg  4 mg Intravenous Q6H PRN Barbarann Nest, MD   4 mg at 03/23/24 1255   pantoprazole  (PROTONIX ) EC tablet 40 mg  40 mg Oral Daily Barbarann Nest, MD   40 mg at 03/24/24 1118   polyethylene glycol (MIRALAX / GLYCOLAX) packet 17 g  17 g Oral Daily Elpidio Reyes DEL, MD   17 g at 03/23/24 1542   rosuvastatin  (CRESTOR ) tablet 10 mg  10 mg Oral Daily Barbarann Nest, MD   10 mg at 03/24/24 1118   sodium bicarbonate  tablet 650 mg   650 mg Oral BID Elpidio Reyes DEL, MD       sodium chloride  flush (NS) 0.9 % injection 3 mL  3 mL Intravenous Q12H Barbarann Nest, MD   3 mL at 03/24/24 1121   torsemide (DEMADEX) tablet 10 mg  10 mg Oral Daily Elpidio Reyes DEL, MD   10 mg at 03/24/24 1118     Discharge Medications: Please see discharge summary for a list of discharge medications.  Relevant Imaging Results:  Relevant Lab Results:   Additional Information SS#: [id number]  Lauraine JAYSON Carpen, LCSW

## 2024-03-24 NOTE — Care Management Important Message (Signed)
 Important Message  Patient Details  Name: Stephanie [name] MRN: [id number] Date of Birth: 1941-03-26   Important Message Given:  Yes - Medicare IM     Rojelio [name] 03/24/2024, 12:10 PM

## 2024-03-25 DIAGNOSIS — A419 Sepsis, unspecified organism: Secondary | ICD-10-CM | POA: Diagnosis not present

## 2024-03-25 DIAGNOSIS — N390 Urinary tract infection, site not specified: Secondary | ICD-10-CM | POA: Diagnosis not present

## 2024-03-25 LAB — CULTURE, BLOOD (ROUTINE X 2): Culture  Setup Time: NO GROWTH

## 2024-03-25 LAB — CBC
HCT: 29.6 % — ABNORMAL LOW (ref 36.0–46.0)
Hemoglobin: 9 g/dL — ABNORMAL LOW (ref 12.0–15.0)
MCH: 29 pg (ref 26.0–34.0)
MCHC: 30.4 g/dL (ref 30.0–36.0)
MCV: 95.5 fL (ref 80.0–100.0)
Platelets: 225 10*3/uL (ref 150–400)
RBC: 3.1 MIL/uL — ABNORMAL LOW (ref 3.87–5.11)
RDW: 15.3 % (ref 11.5–15.5)
WBC: 13.4 10*3/uL — ABNORMAL HIGH (ref 4.0–10.5)
nRBC: 0 % (ref 0.0–0.2)

## 2024-03-25 LAB — BASIC METABOLIC PANEL WITH GFR
Anion gap: 8 (ref 5–15)
BUN: 46 mg/dL — ABNORMAL HIGH (ref 8–23)
CO2: 18 mmol/L — ABNORMAL LOW (ref 22–32)
Calcium: 8.2 mg/dL — ABNORMAL LOW (ref 8.9–10.3)
Chloride: 107 mmol/L (ref 98–111)
Creatinine, Ser: 3.03 mg/dL — ABNORMAL HIGH (ref 0.44–1.00)
GFR, Estimated: 15 mL/min — ABNORMAL LOW (ref >60)
Glucose, Bld: 146 mg/dL — ABNORMAL HIGH (ref 70–99)
Potassium: 3.7 mmol/L (ref 3.5–5.1)
Sodium: 133 mmol/L — ABNORMAL LOW (ref 135–145)

## 2024-03-25 LAB — GLUCOSE, CAPILLARY
Glucose-Capillary: 161 mg/dL — ABNORMAL HIGH (ref 70–99)
Glucose-Capillary: 216 mg/dL — ABNORMAL HIGH (ref 70–99)
Glucose-Capillary: 205 mg/dL — ABNORMAL HIGH (ref 70–99)
Glucose-Capillary: 159 mg/dL — ABNORMAL HIGH (ref 70–99)

## 2024-03-25 MED ORDER — DOCUSATE SODIUM 100 MG PO CAPS: 100.0000 mg | ORAL_CAPSULE | Freq: Two times a day (BID) | ORAL | Status: DC | PRN

## 2024-03-25 NOTE — TOC Progression Note (Addendum)
 Transition of Care [facility]) - Progression Note    Patient Details  Name: Stephanie [name] MRN: [id number] Date of Birth: 04/23/1941  Transition of Care [facility]) CM/SW Contact  Lauraine JAYSON Carpen, LCSW Phone Number: 03/25/2024, 11:40 AM  Clinical Narrative:   Per ID pharmacist, patient will likely discharge on Ceftriaxone  vs Ertapenum until around 7/1. [facility] and [facility] are aware and can provide. Left message for [facility] admissions coordinator to make sure they can provide. Will give bed offer once response received.  1:22 pm: No family at bedside and patient asleep. CSW left bed offers on windowsill in room and left husband a voicemail to notify.  Expected Discharge Plan: Skilled Nursing Facility Barriers to Discharge: Continued Medical Work up  Expected Discharge Plan and Services     Post Acute Care Choice: Skilled Nursing Facility Living arrangements for the past 2 months: Single Family Home                                       Social Determinants of Health (SDOH) Interventions SDOH Screenings   Food Insecurity: No Food Insecurity (03/22/2024)  Housing: High Risk (03/22/2024)  Transportation Needs: No Transportation Needs (03/22/2024)  Utilities: Not At Risk (03/22/2024)  Depression (PHQ2-9): Low Risk  (03/27/2023)  Financial Resource Strain: Low Risk  (09/05/2023)   Received from [facility]  Social Connections: Patient Declined (03/22/2024)  Tobacco Use: Low Risk  (03/22/2024)  Recent Concern: Tobacco Use - Medium Risk (02/26/2024)   Received from [facility]    Readmission Risk Interventions    03/24/2024    4:33 PM 05/02/2022   10:33 AM 04/29/2022    9:58 AM  Readmission Risk Prevention Plan  Transportation Screening   Complete  PCP or Specialist Appt within 3-5 Days Complete  Complete  HRI or Home Care Consult   Complete  Social Work Consult for Recovery Care Planning/Counseling Complete  Complete   Palliative Care Screening Not Applicable Complete Not Applicable  Medication Review Oceanographer)   Complete

## 2024-03-25 NOTE — Progress Notes (Signed)
 Physical Therapy Treatment Patient Details Name: SAIDAH KEMPTON MRN: [id number] DOB: Nov 16, 1940 Today's Date: 03/25/2024   History of Present Illness Coral Bickhart is an 83 y.o F who comes to [facility] on 03/22/24 for acute onset weakness. Pt admitted with UTI. anemia, asthma, chronic kidney disease, type 2 diabetes mellitus, atrial fibrillation, GERD, glaucoma, hypertension, dyslipidemia, and vitamin B-12 deficiency, right hydronephrosis and right ureteral stent. At baseline pt is a household AMB with RW, requires intermittent minA for bed mobility and low surface transfers from husband.    PT Comments  Pt was asleep supine in bed upon entry and easily woken, pt was reluctant to participate therapy initially (reports of fatigue and not feeling well compared to yesterday), pt required additional motivation and education on the importance for mobility for recover to agree to today's session. Due to increased reports of BM, pt perform bed mobility (rolling/supine to sit) with 2x Mod a (for trunk stability and to perform pericare) with verbal cues for UE use on bed rails. Once cleaned, and with increased motivation/support from husband, pt agreed to trial a bout of sitting balance. Due to pt's previous response to exercise during PT, vitals were taken while seated BP (109/60), HR: 65 bpm and SpO2: 94% on room air. While seated for approximately 20 seconds, pt became unresponsive to questions (syncopal episode) and would not open her eyes as requested. This prompted a 2x total A to scoop into bed and reassessment of vitals. Vitals read as follows BP: (129/59) HR:81 bpm SpO2:95% on room air. Once positioned in bed, PT performed 3 sternal rubs, with intermittent responses with the first two attempts, finally regaining consciousness with the final attempt. Following aforementioned actions, pt became very alert and was oriented to place and name. PT proceeded to educate caregiver and pt on the importance of mobility and  tolerance to positional changes after prolonged bed rest. PT suggested incremental increased of head of bed to acclimate BP to positional changes in preparation for movement. Following treatment pt was able to lie in bed with HOB elevated, with no residual symptoms. Nurse and medical team notified. Pt would benefit from skilled PT to continue to work towards PT goals and return to PLOF.          If plan is discharge home, recommend the following: A lot of help with walking and/or transfers;Assistance with cooking/housework;Help with stairs or ramp for entrance;Assist for transportation;Direct supervision/assist for financial management;Direct supervision/assist for medications management;Supervision due to cognitive status;A lot of help with bathing/dressing/bathroom   Can travel by private vehicle     No  Equipment Recommendations  None recommended by PT    Recommendations for Other Services       Precautions / Restrictions Precautions Precautions: Fall Recall of Precautions/Restrictions: Intact Restrictions Weight Bearing Restrictions Per Provider Order: No     Mobility  Bed Mobility Overal bed mobility: Needs Assistance Bed Mobility: Supine to Sit, Sit to Supine, Rolling Rolling: Mod assist, +2 for physical assistance (for pericare management following BM)   Supine to sit: HOB elevated, Mod assist Sit to supine: TOTAL, +2 for safety/equipment (due to unresponsive status following bout of sitting)   General bed mobility comments: 2x rolling to each side for pericare from noted BM    Transfers   General transfer comment: deferred due to low orthostatic BP with sitting    Ambulation/Gait   General Gait Details: Defered      Balance Overall balance assessment: Needs assistance Sitting-balance support: Feet supported, No upper  extremity supported, Single extremity supported Sitting balance-Leahy Scale: Fair Sitting balance - Comments: Min-Max A for static sitting  with vc's for increased verbal responses, after ~20 seconds, pt became unresponsive and required total x2 assist to reposition patient into bed. Postural control: Right lateral lean     Standing balance comment: Deferred due to low seated BP      Communication Communication Communication: Impaired Factors Affecting Communication: Hearing impaired  Cognition Arousal: Alert Behavior During Therapy: Flat affect   PT - Cognitive impairments: Difficult to assess      PT - Cognition Comments: Husband is primary caregiver, will usually attest to questions if a clear answer is not obtained Following commands: Impaired Following commands impaired: Follows one step commands with increased time    Cueing Cueing Techniques: Verbal cues, Tactile cues     General Comments General comments (skin integrity, edema, etc.): Pre session: HR: 63 bpm, SpO2: 94% on room air, Once seated: BP (109/60), HR: 65 bpm, SpO2: 94+% on room air, started to become unresponsive, required total 2+ to get pt onto bed. Once supine BP (129/59), HR: 81 bpm, End of session: HR: 65 bpm, SpO2:95+% on room air and alert.      Pertinent Vitals/Pain Pain Assessment Pain Assessment: Faces Faces Pain Scale: Hurts little more Pain Location: Abdomen, per visual assessment appears distended Pain Descriptors / Indicators: Constant, Cramping, Grimacing, Moaning Pain Intervention(s): Limited activity within patient's tolerance, Monitored during session, Relaxation, Other (comment) (Nurse notified)     PT Goals (current goals can now be found in the care plan section) Acute Rehab PT Goals Patient Stated Goal: regain baseline strength for return to home PT Goal Formulation: With family Time For Goal Achievement: 04/05/24 Potential to Achieve Goals: Fair Progress towards PT goals: Progressing toward goals    Frequency    Min 2X/week       AM-PAC PT 6 Clicks Mobility   Outcome Measure  Help needed turning from your  back to your side while in a flat bed without using bedrails?: A Lot Help needed moving from lying on your back to sitting on the side of a flat bed without using bedrails?: A Lot Help needed moving to and from a bed to a chair (including a wheelchair)?: A Lot Help needed standing up from a chair using your arms (e.g., wheelchair or bedside chair)?: A Lot Help needed to walk in hospital room?: Total Help needed climbing 3-5 steps with a railing? : Total 6 Click Score: 10    End of Session Equipment Utilized During Treatment: Gait belt Activity Tolerance: Treatment limited secondary to medical complications (Comment);Patient limited by fatigue Patient left: in bed;with call bell/phone within reach;with family/visitor present;with bed alarm set Nurse Communication: Mobility status;Precautions;Other (comment) (Medical team notified of vitals and unresponsive event while seated EOB) PT Visit Diagnosis: Difficulty in walking, not elsewhere classified (R26.2);Other abnormalities of gait and mobility (R26.89);Muscle weakness (generalized) (M62.81)     Time: 8655-8597 PT Time Calculation (min) (ACUTE ONLY): 18 min  Charges:                           Latandra Loureiro, SPT 03/25/24, 3:24 PM

## 2024-03-25 NOTE — Progress Notes (Signed)
 INFECTIOUS DISEASE PROGRESS NOTE Date of Admission:  03/22/2024     ID: Stephanie [name] is a 83 y.o. female with UTI, bacteremia Principal Problem:   Sepsis secondary to UTI [facility]) Active Problems:   DM type 2 with diabetic mixed hyperlipidemia (HCC)   Rheumatoid arthritis involving multiple sites with positive rheumatoid factor (HCC)   Pure hypercholesterolemia   CKD (chronic kidney disease) stage 5, GFR less than 15 ml/min (HCC)   UPJ obstruction, acquired   Type II diabetes mellitus with renal manifestations (HCC)   Anemia due to stage 4 chronic kidney disease (HCC)   Subjective: No fevers wbc nml. Not eating much today. Dizzy with getting up. Husband notes abd distention. Having some loose stools  ROS  Eleven systems are reviewed and negative except per hpi  Medications:  Antibiotics Given (last 72 hours)     Date/Time Action Medication Dose Rate   03/22/24 2210 New Bag/Given   meropenem  (MERREM ) 500 mg in sodium chloride  0.9 % 100 mL IVPB 500 mg 200 mL/hr   03/23/24 0924 New Bag/Given   meropenem  (MERREM ) 500 mg in sodium chloride  0.9 % 100 mL IVPB 500 mg 200 mL/hr   03/23/24 2212 New Bag/Given   meropenem  (MERREM ) 500 mg in sodium chloride  0.9 % 100 mL IVPB 500 mg 200 mL/hr   03/24/24 1113 New Bag/Given   meropenem  (MERREM ) 500 mg in sodium chloride  0.9 % 100 mL IVPB 500 mg 200 mL/hr   03/24/24 2119 New Bag/Given   cefTRIAXone  (ROCEPHIN ) 2 g in sodium chloride  0.9 % 100 mL IVPB 2 g 200 mL/hr       brimonidine   1 drop Both Eyes BID   Chlorhexidine  Gluconate Cloth  6 each Topical Daily   cloNIDine   0.1 mg Oral Daily   diltiazem   300 mg Oral Daily   enoxaparin  (LOVENOX ) injection  30 mg Subcutaneous Q24H   hydrALAZINE   50 mg Oral Q8H   insulin  aspart  0-9 Units Subcutaneous TID WC   metoCLOPramide  5 mg Oral BID   metoprolol  tartrate  50 mg Oral BID   pantoprazole   40 mg Oral Daily   polyethylene glycol  17 g Oral Daily   rosuvastatin   10 mg Oral Daily   sodium  bicarbonate  650 mg Oral BID   sodium chloride  flush  3 mL Intravenous Q12H   torsemide  10 mg Oral Daily    Objective: Vital signs in last 24 hours: Temp:  [98.3 F (36.8 C)-100.3 F (37.9 C)] 98.3 F (36.8 C) (06/25 1424) Pulse Rate:  [59-64] 59 (06/25 1424) Resp:  [14-20] 20 (06/25 1424) BP: (147-180)/(59-66) 147/66 (06/25 1424) SpO2:  [90 %-95 %] 95 % (06/25 1424) GEN sleeping in bed RRR CTAB Abd mod distention, tympanic. Rectal - no stool in vault. Loose brown liquid on withdrawal of finger Foley in place  Lab Results Recent Labs    03/24/24 0409 03/25/24 0428  WBC 12.6* 13.4*  HGB 9.8* 9.0*  HCT 32.4* 29.6*  NA 136 133*  K 3.9 3.7  CL 109 107  CO2 19* 18*  BUN 46* 46*  CREATININE 2.87* 3.03*    Microbiology: Results for orders placed or performed during the hospital encounter of 03/22/24  Resp panel by RT-PCR (RSV, Flu A&B, Covid) Anterior Nasal Swab     Status: None   Collection Time: 03/22/24  8:50 AM   Specimen: Anterior Nasal Swab  Result Value Ref Range Status   SARS Coronavirus 2 by RT PCR NEGATIVE  NEGATIVE Final    Comment: (NOTE) SARS-CoV-2 target nucleic acids are NOT DETECTED.  The SARS-CoV-2 RNA is generally detectable in upper respiratory specimens during the acute phase of infection. The lowest concentration of SARS-CoV-2 viral copies this assay can detect is 138 copies/mL. A negative result does not preclude SARS-Cov-2 infection and should not be used as the sole basis for treatment or other patient management decisions. A negative result may occur with  improper specimen collection/handling, submission of specimen other than nasopharyngeal swab, presence of viral mutation(s) within the areas targeted by this assay, and inadequate number of viral copies(<138 copies/mL). A negative result must be combined with clinical observations, patient history, and epidemiological information. The expected result is Negative.  Fact Sheet for  Patients:  BloggerCourse.com  Fact Sheet for Healthcare Providers:  SeriousBroker.it  This test is no t yet approved or cleared by the [place]  FDA and  has been authorized for detection and/or diagnosis of SARS-CoV-2 by FDA under an Emergency Use Authorization (EUA). This EUA will remain  in effect (meaning this test can be used) for the duration of the COVID-19 declaration under Section 564(b)(1) of the Act, 21 U.S.C.section 360bbb-3(b)(1), unless the authorization is terminated  or revoked sooner.       Influenza A by PCR NEGATIVE NEGATIVE Final   Influenza B by PCR NEGATIVE NEGATIVE Final    Comment: (NOTE) The Xpert Xpress SARS-CoV-2/FLU/RSV plus assay is intended as an aid in the diagnosis of influenza from Nasopharyngeal swab specimens and should not be used as a sole basis for treatment. Nasal washings and aspirates are unacceptable for Xpert Xpress SARS-CoV-2/FLU/RSV testing.  Fact Sheet for Patients: BloggerCourse.com  Fact Sheet for Healthcare Providers: SeriousBroker.it  This test is not yet approved or cleared by the [place]  FDA and has been authorized for detection and/or diagnosis of SARS-CoV-2 by FDA under an Emergency Use Authorization (EUA). This EUA will remain in effect (meaning this test can be used) for the duration of the COVID-19 declaration under Section 564(b)(1) of the Act, 21 U.S.C. section 360bbb-3(b)(1), unless the authorization is terminated or revoked.     Resp Syncytial Virus by PCR NEGATIVE NEGATIVE Final    Comment: (NOTE) Fact Sheet for Patients: BloggerCourse.com  Fact Sheet for Healthcare Providers: SeriousBroker.it  This test is not yet approved or cleared by the [place]  FDA and has been authorized for detection and/or diagnosis of SARS-CoV-2 by FDA under an Emergency  Use Authorization (EUA). This EUA will remain in effect (meaning this test can be used) for the duration of the COVID-19 declaration under Section 564(b)(1) of the Act, 21 U.S.C. section 360bbb-3(b)(1), unless the authorization is terminated or revoked.  Performed at [facility], [address] [place], [place] [address]   Urine Culture (for pregnant, neutropenic or urologic patients or patients with an indwelling urinary catheter)     Status: Abnormal   Collection Time: 03/22/24  8:50 AM   Specimen: Urine, Catheterized  Result Value Ref Range Status   Specimen Description   Final    URINE, CATHETERIZED Performed at [facility], [address], [place], [place] [address]    Special Requests   Final    NONE Performed at [facility], [address] [place], [place] [address]    Culture >=100,000 COLONIES/mL ESCHERICHIA COLI (A)  Final   Report Status 03/24/2024 FINAL  Final   Organism ID, Bacteria ESCHERICHIA COLI (A)  Final      Susceptibility   Escherichia  coli - MIC*    AMPICILLIN >=32 RESISTANT Resistant     CEFAZOLIN  RESISTANT Resistant     CEFEPIME  <=0.12 SENSITIVE Sensitive     CEFTRIAXONE  <=0.25 SENSITIVE Sensitive     CIPROFLOXACIN  >=4 RESISTANT Resistant     GENTAMICIN <=1 SENSITIVE Sensitive     IMIPENEM <=0.25 SENSITIVE Sensitive     NITROFURANTOIN  <=16 SENSITIVE Sensitive     TRIMETH /SULFA  >=320 RESISTANT Resistant     AMPICILLIN/SULBACTAM >=32 RESISTANT Resistant     PIP/TAZO 16 SENSITIVE Sensitive ug/mL    * >=100,000 COLONIES/mL ESCHERICHIA COLI  Blood culture (routine x 2)     Status: Abnormal   Collection Time: 03/22/24  9:38 AM   Specimen: BLOOD  Result Value Ref Range Status   Specimen Description   Final    BLOOD LEFT ANTECUBITAL Performed at [facility], [address] [place], [place] [address]    Special Requests   Final    BOTTLES DRAWN AEROBIC AND ANAEROBIC Blood Culture results may not be optimal  due to an inadequate volume of blood received in culture bottles Performed at [facility], [address] [place], [place] [address]    Culture  Setup Time   Final    GRAM POSITIVE COCCI AEROBIC BOTTLE ONLY CRITICAL RESULT CALLED TO, READ BACK BY AND VERIFIED WITH: JASON ROBBINS 03/23/24 0445 MW    Culture (A)  Final    STAPHYLOCOCCUS WARNERI THE SIGNIFICANCE OF ISOLATING THIS ORGANISM FROM A SINGLE SET OF BLOOD CULTURES WHEN MULTIPLE SETS ARE DRAWN IS UNCERTAIN. PLEASE NOTIFY THE MICROBIOLOGY DEPARTMENT WITHIN ONE WEEK IF SPECIATION AND SENSITIVITIES ARE REQUIRED. Performed at [facility], [address] [place], [place] [address]    Report Status 03/25/2024 FINAL  Final  Blood culture (routine x 2)     Status: None (Preliminary result)   Collection Time: 03/22/24  9:38 AM   Specimen: BLOOD  Result Value Ref Range Status   Specimen Description BLOOD BLOOD LEFT ARM  Final   Special Requests   Final    BOTTLES DRAWN AEROBIC AND ANAEROBIC Blood Culture adequate volume   Culture   Final    NO GROWTH 3 DAYS Performed at [facility], [address] [place], [place] [address]    Report Status PENDING  Incomplete  Blood Culture ID Panel (Reflexed)     Status: Abnormal   Collection Time: 03/22/24  9:38 AM  Result Value Ref Range Status   Enterococcus faecalis NOT DETECTED NOT DETECTED Final   Enterococcus Faecium NOT DETECTED NOT DETECTED Final   Listeria monocytogenes NOT DETECTED NOT DETECTED Final   Staphylococcus species DETECTED (A) NOT DETECTED Final    Comment: CRITICAL RESULT CALLED TO, READ BACK BY AND VERIFIED WITH: JASON ROBBINS 03/23/24 0445 MW    Staphylococcus aureus (BCID) NOT DETECTED NOT DETECTED Final   Staphylococcus epidermidis NOT DETECTED NOT DETECTED Final   Staphylococcus lugdunensis NOT DETECTED NOT DETECTED Final   Streptococcus species NOT DETECTED NOT DETECTED Final   Streptococcus agalactiae NOT DETECTED NOT DETECTED Final    Streptococcus pneumoniae NOT DETECTED NOT DETECTED Final   Streptococcus pyogenes NOT DETECTED NOT DETECTED Final   A.calcoaceticus-baumannii NOT DETECTED NOT DETECTED Final   Bacteroides fragilis NOT DETECTED NOT DETECTED Final   Enterobacterales NOT DETECTED NOT DETECTED Final   Enterobacter cloacae complex NOT DETECTED NOT DETECTED Final   Escherichia coli NOT DETECTED NOT DETECTED Final   Klebsiella aerogenes NOT DETECTED NOT DETECTED Final   Klebsiella oxytoca  NOT DETECTED NOT DETECTED Final   Klebsiella pneumoniae NOT DETECTED NOT DETECTED Final   Proteus species NOT DETECTED NOT DETECTED Final   Salmonella species NOT DETECTED NOT DETECTED Final   Serratia marcescens NOT DETECTED NOT DETECTED Final   Haemophilus influenzae NOT DETECTED NOT DETECTED Final   Neisseria meningitidis NOT DETECTED NOT DETECTED Final   Pseudomonas aeruginosa NOT DETECTED NOT DETECTED Final   Stenotrophomonas maltophilia NOT DETECTED NOT DETECTED Final   Candida albicans NOT DETECTED NOT DETECTED Final   Candida auris NOT DETECTED NOT DETECTED Final   Candida glabrata NOT DETECTED NOT DETECTED Final   Candida krusei NOT DETECTED NOT DETECTED Final   Candida parapsilosis NOT DETECTED NOT DETECTED Final   Candida tropicalis NOT DETECTED NOT DETECTED Final   Cryptococcus neoformans/gattii NOT DETECTED NOT DETECTED Final    Comment: Performed at [facility], [address] [place], [place] [address]  MRSA Next Gen by PCR, Nasal     Status: None   Collection Time: 03/23/24  9:20 AM   Specimen: Nasal Mucosa; Nasal Swab  Result Value Ref Range Status   MRSA by PCR Next Gen NOT DETECTED NOT DETECTED Final    Comment: (NOTE) The GeneXpert MRSA Assay (FDA approved for NASAL specimens only), is one component of a comprehensive MRSA colonization surveillance program. It is not intended to diagnose MRSA infection nor to guide or monitor treatment for MRSA infections. Test performance is not FDA  approved in patients less than 85 years old. Performed at [facility], [address] [place], [place] [address]   Culture, blood (single) w Reflex to ID Panel     Status: None (Preliminary result)   Collection Time: 03/23/24  4:45 PM   Specimen: BLOOD  Result Value Ref Range Status   Specimen Description BLOOD BLOOD RIGHT HAND  Final   Special Requests   Final    BOTTLES DRAWN AEROBIC AND ANAEROBIC Blood Culture adequate volume   Culture   Final    NO GROWTH 2 DAYS Performed at [facility], [address] [place], [place] [address]    Report Status PENDING  Incomplete    Studies/Results: No results found.  Assessment/Plan: TAREA SKILLMAN is a 83 y.o. 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  In the ED she was found to have no fever but a white count of 18.  Lactic acid was elevated at 2.  Creatinine was around her baseline 3.  Urine sample showed moderate leukocyte esterase greater than 50 whites urine cultures pending. She had a CT scan done that showed stent in place on the right side nephroureteral stent but no signs of high-grade obstructive uropathy.  There was desiccated stool large volume and distended rectum.  Chest x-ray was normal.  Lower extremity Doppler was negative. Since then blood cultures have returned positive with 1 of 2 growing gram-positive cocci identified as staph species not Staph aureus. UCX > 100 K GNR She also has progressive decline per her husband over last few weeks. Has progressive anemia.      6/24- Ucx with E coli, relatively sensitive. BCX ID pending 6/25- cx with S warneri - likely contaminant FU bcx 6/23 negaitve  Recommendations UTI - in pt with chronic foley and nephrouteral stent chronically. She has complicated history and has had stent in place and chronic foley for several years. Per her husband was on chronic suppressive abx  several years  ago with Dr Ike but since having foley cath placed has only had a few UTIs and not on recent abx Cont ctx  Will likely need 10 days treatment for complicated UTI given stent and foley in place and high risk of recurrences.  Discussed with husband Messaged urology re considering changing stent once we sterilize the urine vs trying to treat her through and only change stent if recurs Can place picc line since bcx 6/23 neg   Bacteremia- 1/2 sets staph warneri -  I suspect contaminant. No pacemaker or lines. Repeat bcx NGTD .    Possible impaction- noted on CT = husband says has BMs every few days. Consider bowel regimen.   Progressive decline at home for last several weeks   Progressive anemia - unclear etiology.    Thank you very much for the consult. Will follow with you.  Alm SHAUNNA Needle   03/25/2024, 2:33 PM

## 2024-03-25 NOTE — Plan of Care (Signed)

## 2024-03-25 NOTE — Progress Notes (Signed)
 OT Cancellation Note  Patient Details Name: Stephanie [name] MRN: [id number] DOB: 01-09-1941   Cancelled Treatment:    Reason Eval/Treat Not Completed: Medical issues which prohibited therapy. Discussed with PT team - pt with brief unresponsive episode during session requiring sternal rub. OT will hold tx at this time and see when medically appropriate.   Danely Bayliss L. Demitria Hay, OTR/L  03/25/24, 2:13 PM

## 2024-03-25 NOTE — Progress Notes (Signed)
 PROGRESS NOTE    Stephanie [name]  FMW:[id number] DOB: [birth year] DOA: 03/22/2024 PCP: Cleotilde Oneil FALCON, MD   Assessment & Plan:   Principal Problem:   Sepsis secondary to UTI [facility]) Active Problems:   DM type 2 with diabetic mixed hyperlipidemia (HCC)   Rheumatoid arthritis involving multiple sites with positive rheumatoid factor (HCC)   Pure hypercholesterolemia   CKD (chronic kidney disease) stage 5, GFR less than 15 ml/min (HCC)   UPJ obstruction, acquired   Type II diabetes mellitus with renal manifestations (HCC)   Anemia due to stage 4 chronic kidney disease (HCC)  Assessment and Plan: Sepsis due to UTI: urine cx growing e. coli. Foley exchanged in the ER. Likely secondary to chronic foley. Continue on IV rocephin  x 10 days as per ID. ID following and recs apprec    Staph bacteremia: staph warneri, etiology unclear. Repeat blood cxs ordered today. ? containment    H/o urinary retention and hydronephrosis: foley exchanged in the ER. Hydronephrosis appears to be improved from prior s/p stent. 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 Avoid nephrotoxic meds   ACD: likely secondary to CKD. No need for a transfusion currently   DM2: fair control, HbA1c 7.2. Continue on SSI w/ accuchecks. Holding home glipizide     Metabolic acidosis: continue on sodium bicarb. Likely secondary to CD   HLD: continue on statin    HTN: continue on home dose of dilt, metoprolol , hydralazine  & clonidine  (started this admission). Will need to check orthostatic vital signs as significant concern for orthostatic hypotension after a syncopal event when working with therapy.   RA: holding home dose of leflunomide  w/ active infection    COPD: w/o exacerbation. Continue on bronchodilators    Glaucoma: continue on home dose brimonidine         DVT prophylaxis: lovenox   Code Status: full  Family  Communication: discussed pt's care w/ pt's family at bedside and answered their questions  Disposition Plan: likely d/c to SNF  Level of care: Telemetry Medical  Status is: Inpatient Remains inpatient appropriate because: severity of illness    Consultants:  ID  Procedures:  Antimicrobials: rocephin     Subjective: Pt c/o malaise  Objective: Vitals:   03/24/24 1851 03/24/24 2045 03/25/24 0359 03/25/24 0757  BP: (!) 152/62 (!) 161/59 (!) 147/61 (!) 156/63  Pulse: 63 (!) 59 64 63  Resp:  16 16 16   Temp:  98.5 F (36.9 C) 100.3 F (37.9 C) 98.9 F (37.2 C)  TempSrc:  Oral  Oral  SpO2:  90% 95% 94%  Weight:      Height:        Intake/Output Summary (Last 24 hours) at 03/25/2024 1007 Last data filed at 03/25/2024 0430 Gross per 24 hour  Intake --  Output 550 ml  Net -550 ml   Filed Weights   03/22/24 1000 03/22/24 1414  Weight: 69.4 kg 69.4 kg    Examination:  General exam: Appears calm and comfortable  Respiratory system: Clear to auscultation. Respiratory effort normal. Cardiovascular system: S1 & S2 +. No rubs, gallops or clicks.  Gastrointestinal system: Abdomen is distended, soft and nontender. Hypoactive bowel sounds heard. Central nervous system: Alert and awake. Moves all extremities  Psychiatry: Judgement and insight appear normal. Flat mood and affect    Data Reviewed: I have personally reviewed following labs and imaging studies  CBC: Recent Labs  Lab 03/22/24 0850 03/23/24 0329 03/24/24 0409 03/25/24 0428  WBC 18.3* 11.1* 12.6* 13.4*  NEUTROABS 14.4*  --   --   --   HGB 9.3* 8.1* 9.8* 9.0*  HCT 30.8* 27.2* 32.4* 29.6*  MCV 97.2 96.1 96.1 95.5  PLT 208 166 203 225   Basic Metabolic Panel: Recent Labs  Lab 03/22/24 0850 03/23/24 0329 03/24/24 0409 03/25/24 0428  NA 134* 134* 136 133*  K 5.1 4.7 3.9 3.7  CL 105 108 109 107  CO2 20* 20* 19* 18*  GLUCOSE 191* 100* 124* 146*  BUN 53* 49* 46* 46*  CREATININE 3.32* 2.86* 2.87* 3.03*   CALCIUM  8.4* 8.5* 8.3* 8.2*   GFR: Estimated Creatinine Clearance: 13.6 mL/min (A) (by C-G formula based on SCr of 3.03 mg/dL (H)). Liver Function Tests: Recent Labs  Lab 03/22/24 0850 03/24/24 0409  AST 127* 18  ALT 87* 33  ALKPHOS 108 86  BILITOT 0.8 0.8  PROT 5.5* 5.5*  ALBUMIN  3.1* 2.7*   Recent Labs  Lab 03/22/24 0850  LIPASE 28   No results for input(s): AMMONIA in the last 168 hours. Coagulation Profile: No results for input(s): INR, PROTIME in the last 168 hours. Cardiac Enzymes: No results for input(s): CKTOTAL, CKMB, CKMBINDEX, TROPONINI in the last 168 hours. BNP (last 3 results) No results for input(s): PROBNP in the last 8760 hours. HbA1C: No results for input(s): HGBA1C in the last 72 hours. CBG: Recent Labs  Lab 03/24/24 0738 03/24/24 1141 03/24/24 1536 03/24/24 2147 03/25/24 0757  GLUCAP 130* 193* 221* 144* 161*   Lipid Profile: No results for input(s): CHOL, HDL, LDLCALC, TRIG, CHOLHDL, LDLDIRECT in the last 72 hours. Thyroid Function Tests: No results for input(s): TSH, T4TOTAL, FREET4, T3FREE, THYROIDAB in the last 72 hours. Anemia Panel: No results for input(s): VITAMINB12, FOLATE, FERRITIN, TIBC, IRON , RETICCTPCT in the last 72 hours. Sepsis Labs: Recent Labs  Lab 03/22/24 9061 03/22/24 1051  LATICACIDVEN 2.0* 1.4    Recent Results (from the past 240 hours)  Resp panel by RT-PCR (RSV, Flu A&B, Covid) Anterior Nasal Swab     Status: None   Collection Time: 03/22/24  8:50 AM   Specimen: Anterior Nasal Swab  Result Value Ref Range Status   SARS Coronavirus 2 by RT PCR NEGATIVE NEGATIVE Final    Comment: (NOTE) SARS-CoV-2 target nucleic acids are NOT DETECTED.  The SARS-CoV-2 RNA is generally detectable in upper respiratory specimens during the acute phase of infection. The lowest concentration of SARS-CoV-2 viral copies this assay can detect is 138 copies/mL. A negative result  does not preclude SARS-Cov-2 infection and should not be used as the sole basis for treatment or other patient management decisions. A negative result may occur with  improper specimen collection/handling, submission of specimen other than nasopharyngeal swab, presence of viral mutation(s) within the areas targeted by this assay, and inadequate number of viral copies(<138 copies/mL). A negative result must be combined with clinical observations, patient history, and epidemiological information. The expected result is Negative.  Fact Sheet for Patients:  BloggerCourse.com  Fact Sheet for Healthcare Providers:  SeriousBroker.it  This test is no t yet approved or cleared by the [place]  FDA and  has been authorized for detection and/or diagnosis of SARS-CoV-2 by FDA under an Emergency Use Authorization (EUA). This EUA will remain  in effect (meaning this test can be used) for the duration of the COVID-19 declaration under Section 564(b)(1) of the Act, 21 U.S.C.section 360bbb-3(b)(1), unless  the authorization is terminated  or revoked sooner.       Influenza A by PCR NEGATIVE NEGATIVE Final   Influenza B by PCR NEGATIVE NEGATIVE Final    Comment: (NOTE) The Xpert Xpress SARS-CoV-2/FLU/RSV plus assay is intended as an aid in the diagnosis of influenza from Nasopharyngeal swab specimens and should not be used as a sole basis for treatment. Nasal washings and aspirates are unacceptable for Xpert Xpress SARS-CoV-2/FLU/RSV testing.  Fact Sheet for Patients: BloggerCourse.com  Fact Sheet for Healthcare Providers: SeriousBroker.it  This test is not yet approved or cleared by the [place]  FDA and has been authorized for detection and/or diagnosis of SARS-CoV-2 by FDA under an Emergency Use Authorization (EUA). This EUA will remain in effect (meaning this test can be used) for  the duration of the COVID-19 declaration under Section 564(b)(1) of the Act, 21 U.S.C. section 360bbb-3(b)(1), unless the authorization is terminated or revoked.     Resp Syncytial Virus by PCR NEGATIVE NEGATIVE Final    Comment: (NOTE) Fact Sheet for Patients: BloggerCourse.com  Fact Sheet for Healthcare Providers: SeriousBroker.it  This test is not yet approved or cleared by the [place]  FDA and has been authorized for detection and/or diagnosis of SARS-CoV-2 by FDA under an Emergency Use Authorization (EUA). This EUA will remain in effect (meaning this test can be used) for the duration of the COVID-19 declaration under Section 564(b)(1) of the Act, 21 U.S.C. section 360bbb-3(b)(1), unless the authorization is terminated or revoked.  Performed at [facility], [address], [place], [place] [address]   Urine Culture (for pregnant, neutropenic or urologic patients or patients with an indwelling urinary catheter)     Status: Abnormal   Collection Time: 03/22/24  8:50 AM   Specimen: Urine, Catheterized  Result Value Ref Range Status   Specimen Description   Final    URINE, CATHETERIZED Performed at [facility], [address], [place], [place] [address]    Special Requests   Final    NONE Performed at [facility], [address] [place], [place] [address]    Culture >=100,000 COLONIES/mL ESCHERICHIA COLI (A)  Final   Report Status 03/24/2024 FINAL  Final   Organism ID, Bacteria ESCHERICHIA COLI (A)  Final      Susceptibility   Escherichia coli - MIC*    AMPICILLIN >=32 RESISTANT Resistant     CEFAZOLIN  RESISTANT Resistant     CEFEPIME  <=0.12 SENSITIVE Sensitive     CEFTRIAXONE  <=0.25 SENSITIVE Sensitive     CIPROFLOXACIN  >=4 RESISTANT Resistant     GENTAMICIN <=1 SENSITIVE Sensitive     IMIPENEM <=0.25 SENSITIVE Sensitive     NITROFURANTOIN  <=16 SENSITIVE Sensitive      TRIMETH /SULFA  >=320 RESISTANT Resistant     AMPICILLIN/SULBACTAM >=32 RESISTANT Resistant     PIP/TAZO 16 SENSITIVE Sensitive ug/mL    * >=100,000 COLONIES/mL ESCHERICHIA COLI  Blood culture (routine x 2)     Status: Abnormal   Collection Time: 03/22/24  9:38 AM   Specimen: BLOOD  Result Value Ref Range Status   Specimen Description   Final    BLOOD LEFT ANTECUBITAL Performed at [facility], [address] [place], [place] [address]    Special Requests   Final    BOTTLES DRAWN AEROBIC AND ANAEROBIC Blood Culture results may not be optimal due to an inadequate volume of blood received in culture bottles Performed at [facility], [address] [place], [place] [address]  Culture  Setup Time   Final    GRAM POSITIVE COCCI AEROBIC BOTTLE ONLY CRITICAL RESULT CALLED TO, READ BACK BY AND VERIFIED WITH: JASON ROBBINS 03/23/24 0445 MW    Culture (A)  Final    STAPHYLOCOCCUS WARNERI THE SIGNIFICANCE OF ISOLATING THIS ORGANISM FROM A SINGLE SET OF BLOOD CULTURES WHEN MULTIPLE SETS ARE DRAWN IS UNCERTAIN. PLEASE NOTIFY THE MICROBIOLOGY DEPARTMENT WITHIN ONE WEEK IF SPECIATION AND SENSITIVITIES ARE REQUIRED. Performed at [facility], [address] [place], [place] [address]    Report Status 03/25/2024 FINAL  Final  Blood culture (routine x 2)     Status: None (Preliminary result)   Collection Time: 03/22/24  9:38 AM   Specimen: BLOOD  Result Value Ref Range Status   Specimen Description BLOOD BLOOD LEFT ARM  Final   Special Requests   Final    BOTTLES DRAWN AEROBIC AND ANAEROBIC Blood Culture adequate volume   Culture   Final    NO GROWTH 3 DAYS Performed at [facility], [address] [place], [place] [address]    Report Status PENDING  Incomplete  Blood Culture ID Panel (Reflexed)     Status: Abnormal   Collection Time: 03/22/24  9:38 AM  Result Value Ref Range Status   Enterococcus faecalis NOT DETECTED NOT DETECTED Final   Enterococcus  Faecium NOT DETECTED NOT DETECTED Final   Listeria monocytogenes NOT DETECTED NOT DETECTED Final   Staphylococcus species DETECTED (A) NOT DETECTED Final    Comment: CRITICAL RESULT CALLED TO, READ BACK BY AND VERIFIED WITH: JASON ROBBINS 03/23/24 0445 MW    Staphylococcus aureus (BCID) NOT DETECTED NOT DETECTED Final   Staphylococcus epidermidis NOT DETECTED NOT DETECTED Final   Staphylococcus lugdunensis NOT DETECTED NOT DETECTED Final   Streptococcus species NOT DETECTED NOT DETECTED Final   Streptococcus agalactiae NOT DETECTED NOT DETECTED Final   Streptococcus pneumoniae NOT DETECTED NOT DETECTED Final   Streptococcus pyogenes NOT DETECTED NOT DETECTED Final   A.calcoaceticus-baumannii NOT DETECTED NOT DETECTED Final   Bacteroides fragilis NOT DETECTED NOT DETECTED Final   Enterobacterales NOT DETECTED NOT DETECTED Final   Enterobacter cloacae complex NOT DETECTED NOT DETECTED Final   Escherichia coli NOT DETECTED NOT DETECTED Final   Klebsiella aerogenes NOT DETECTED NOT DETECTED Final   Klebsiella oxytoca NOT DETECTED NOT DETECTED Final   Klebsiella pneumoniae NOT DETECTED NOT DETECTED Final   Proteus species NOT DETECTED NOT DETECTED Final   Salmonella species NOT DETECTED NOT DETECTED Final   Serratia marcescens NOT DETECTED NOT DETECTED Final   Haemophilus influenzae NOT DETECTED NOT DETECTED Final   Neisseria meningitidis NOT DETECTED NOT DETECTED Final   Pseudomonas aeruginosa NOT DETECTED NOT DETECTED Final   Stenotrophomonas maltophilia NOT DETECTED NOT DETECTED Final   Candida albicans NOT DETECTED NOT DETECTED Final   Candida auris NOT DETECTED NOT DETECTED Final   Candida glabrata NOT DETECTED NOT DETECTED Final   Candida krusei NOT DETECTED NOT DETECTED Final   Candida parapsilosis NOT DETECTED NOT DETECTED Final   Candida tropicalis NOT DETECTED NOT DETECTED Final   Cryptococcus neoformans/gattii NOT DETECTED NOT DETECTED Final    Comment: Performed at [facility], [address] [place], [place] [address]  MRSA Next Gen by PCR, Nasal     Status: None   Collection Time: 03/23/24  9:20 AM   Specimen: Nasal Mucosa; Nasal Swab  Result Value Ref Range Status   MRSA by PCR Next Gen NOT DETECTED NOT DETECTED Final  Comment: (NOTE) The GeneXpert MRSA Assay (FDA approved for NASAL specimens only), is one component of a comprehensive MRSA colonization surveillance program. It is not intended to diagnose MRSA infection nor to guide or monitor treatment for MRSA infections. Test performance is not FDA approved in patients less than 70 years old. Performed at [facility], [address] [place], [place] [address]   Culture, blood (single) w Reflex to ID Panel     Status: None (Preliminary result)   Collection Time: 03/23/24  4:45 PM   Specimen: BLOOD  Result Value Ref Range Status   Specimen Description BLOOD BLOOD RIGHT HAND  Final   Special Requests   Final    BOTTLES DRAWN AEROBIC AND ANAEROBIC Blood Culture adequate volume   Culture   Final    NO GROWTH 2 DAYS Performed at [facility], [address] [place], [place] [address]    Report Status PENDING  Incomplete         Radiology Studies: No results found.      Scheduled Meds:  brimonidine   1 drop Both Eyes BID   Chlorhexidine  Gluconate Cloth  6 each Topical Daily   cloNIDine   0.1 mg Oral Daily   diltiazem   300 mg Oral Daily   enoxaparin  (LOVENOX ) injection  30 mg Subcutaneous Q24H   hydrALAZINE   50 mg Oral Q8H   insulin  aspart  0-9 Units Subcutaneous TID WC   metoCLOPramide  5 mg Oral BID   metoprolol  tartrate  50 mg Oral BID   pantoprazole   40 mg Oral Daily   polyethylene glycol  17 g Oral Daily   rosuvastatin   10 mg Oral Daily   sodium bicarbonate   650 mg Oral BID   sodium chloride  flush  3 mL Intravenous Q12H   torsemide  10 mg Oral Daily   Continuous Infusions:  cefTRIAXone  (ROCEPHIN )  IV 2 g (03/24/24 2119)     LOS: 3 days        Anthony CHRISTELLA Pouch, MD [facility] Pager [phone]  If 7PM-7AM, please contact night-coverage [email or web address] 03/25/2024, 10:07 AM

## 2024-03-26 ENCOUNTER — Inpatient Hospital Stay

## 2024-03-26 ENCOUNTER — Ambulatory Visit: Admitting: Physician Assistant

## 2024-03-26 DIAGNOSIS — N390 Urinary tract infection, site not specified: Secondary | ICD-10-CM | POA: Diagnosis not present

## 2024-03-26 DIAGNOSIS — K567 Ileus, unspecified: Secondary | ICD-10-CM

## 2024-03-26 DIAGNOSIS — A419 Sepsis, unspecified organism: Secondary | ICD-10-CM | POA: Diagnosis not present

## 2024-03-26 LAB — GLUCOSE, CAPILLARY
Glucose-Capillary: 169 mg/dL — ABNORMAL HIGH (ref 70–99)
Glucose-Capillary: 147 mg/dL — ABNORMAL HIGH (ref 70–99)
Glucose-Capillary: 171 mg/dL — ABNORMAL HIGH (ref 70–99)
Glucose-Capillary: 145 mg/dL — ABNORMAL HIGH (ref 70–99)

## 2024-03-26 LAB — CULTURE, BLOOD (ROUTINE X 2): Culture: NO GROWTH

## 2024-03-26 MED ORDER — FLEET ENEMA RE ENEM
1.0000 | ENEMA | Freq: Once | RECTAL | Status: AC
  Administered 2024-03-26: 1 via RECTAL

## 2024-03-26 MED ORDER — HYDRALAZINE HCL 20 MG/ML IJ SOLN: 20.0000 mg | INTRAMUSCULAR | Status: DC | PRN

## 2024-03-26 MED ORDER — SODIUM CHLORIDE 0.9 % IV SOLN: INTRAVENOUS | Status: AC

## 2024-03-26 NOTE — Consult Note (Signed)
 [facility] SURGICAL CONSULTATION NOTE (initial) - cpt: 00746   HISTORY OF PRESENT ILLNESS (HPI):  83 y.o. female initially presented to [facility] ED on 06/22 for evaluation of weakness. At the time of presentation, patient had reported been having progressive weakness, trouble ambulating throughout their house. Reportedly at that time also had 1-2 episodes of emesis. 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 Husband also reports history of UTI with sepsis in the past. She is known to our service following exploratory laparotomy and LOA in 2021. She also has a history of abdominal hysterectomy and reported colon surgery for a remote history of colon cancer Work up in the ED revealed sCr - 3.32, hyponatremia to 134, leukocytosis to 18.3K, Hgb to 9.3, initial UCx grew E coli, initial BCx grew staph however felt to be contaminate and repeats have been without growth. She did have initial CT Abdomen/Pelvis which was relatively reassuring aside from large stool burden in the rectum. She was admitted to the medicine service. She is currently on Rocephin . She has reportedly been developing worsening abdominal distension with intermittent nausea and emesis. Per patient and husband, she is having intermittent loose stools as well. She did get KUB today which was concerning for marked distension of small bowel.  Surgery is consulted by hospitalist physician Dr. Anthony Pouch, MD in this context for evaluation and management of possible SBO vs ileus.   PAST MEDICAL HISTORY (PMH):  Past Medical History:  Diagnosis Date   Anemia    Aortic atherosclerosis (HCC)    Arthritis    Asthma    CKD (chronic kidney disease), stage IV (HCC)    Colon cancer (HCC) 1996   Complication of  anesthesia    a.) PONV   Diabetes mellitus without complication (HCC)    Dyspnea    Dysrhythmia    Foley catheter in place    GERD (gastroesophageal reflux disease)    Glaucoma    Hydronephrosis of right kidney    Hyperlipidemia    Hyperparathyroidism due to renal insufficiency (HCC)    Hypertension    Lumbar disc disease    Pneumonia    PONV (postoperative nausea and vomiting)    RBBB (right bundle branch block)    Rheumatoid arthritis involving multiple sites with positive rheumatoid factor (HCC)    Small bowel obstruction (HCC)    Urinary retention    Uses walker    Vitamin B 12 deficiency    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    has, does not wear     PAST SURGICAL HISTORY (PSH):  Past Surgical History:  Procedure Laterality Date   ABDOMINAL HYSTERECTOMY     BACK SURGERY  2006   x5  screws and rods   BREAST BIOPSY Right 11/01/2021   rt br biopsy calcs ribbon clip/ benign 2nd site LIQ   BREAST BIOPSY Right 11/01/2021   rt br stereo calcs coil clip 1st site / benign UIQ   COLON SURGERY     COLONOSCOPY WITH PROPOFOL  N/A 05/22/2017   Procedure: COLONOSCOPY WITH PROPOFOL ;  Surgeon: Viktoria Lamar DASEN, MD;  Location: [facility] ENDOSCOPY;  Service: Endoscopy;  Laterality: N/A;   COLONOSCOPY WITH PROPOFOL  N/A 06/08/2021   Procedure: COLONOSCOPY WITH PROPOFOL ;  Surgeon: Jinny Carmine, MD;  Location: [facility];  Service: Endoscopy;  Laterality: N/A;  Diabetic   CYSTOSCOPY W/  RETROGRADES Right 11/06/2022   Procedure: CYSTOSCOPY WITH RETROGRADE PYELOGRAM;  Surgeon: Twylla Glendia BROCKS, MD;  Location: [facility] ORS;  Service: Urology;  Laterality: Right;   CYSTOSCOPY W/ URETERAL STENT PLACEMENT  07/25/2021   Procedure: CYSTOSCOPY WITH RETROGRADE PYELOGRAM/URETERAL STENT PLACEMENT;  Surgeon: Twylla Glendia BROCKS, MD;  Location: [facility] ORS;  Service: Urology;;   CYSTOSCOPY W/ URETERAL STENT PLACEMENT  02/06/2022   Procedure: CYSTOSCOPY WITH RETROGRADE PYELOGRAM/URETERAL STENT EXCHANGE;  Surgeon:  Twylla Glendia BROCKS, MD;  Location: [facility] ORS;  Service: Urology;;   CYSTOSCOPY W/ URETERAL STENT PLACEMENT Right 11/06/2022   Procedure: CYSTOSCOPY WITH STENT EXCHANGE;  Surgeon: Twylla Glendia BROCKS, MD;  Location: [facility] ORS;  Service: Urology;  Laterality: Right;   CYSTOSCOPY W/ URETERAL STENT PLACEMENT Right 12/03/2023   Procedure: CYSTOSCOPY WITH STENT EXCHANGE;  Surgeon: Twylla Glendia BROCKS, MD;  Location: [facility] ORS;  Service: Urology;  Laterality: Right;   CYSTOSCOPY WITH STENT PLACEMENT Right 08/02/2020   Procedure: CYSTOSCOPY WITH STENT EXCHANGE;  Surgeon: Twylla Glendia BROCKS, MD;  Location: [facility] OR[facility];  Service: Urology;  Laterality: Right;   CYSTOSCOPY WITH STENT PLACEMENT Right 06/18/2020   Procedure: CYSTOSCOPY WITH STENT PLACEMENT;  Surgeon: Watt Rush, MD;  Location: [facility] ORS;  Service: Urology;  Laterality: Right;   EYE SURGERY Bilateral    Cataracts   FRACTURE SURGERY Right 2011   foot surgery   JOINT REPLACEMENT Bilateral    knees   KNEE ARTHROSCOPY Bilateral    LAPAROTOMY N/A 06/30/2020   Procedure: EXPLORATORY LAPAROTOMY;  Surgeon: Jordis Laneta FALCON, MD;  Location: [facility] ORS;  Service: General;  Laterality: N/A;   LUMBAR LAMINECTOMY     POLYPECTOMY  06/08/2021   Procedure: POLYPECTOMY;  Surgeon: Jinny Carmine, MD;  Location: [facility];  Service: Endoscopy;;   TEMPORARY DIALYSIS CATHETER N/A 06/27/2020   Procedure: TEMPORARY DIALYSIS CATHETER;  Surgeon: Jama Cordella MATSU, MD;  Location: [facility] INVASIVE CV LAB;  Service: Cardiovascular;  Laterality: N/A;     MEDICATIONS:  Prior to Admission medications   Medication Sig Start Date End Date Taking? Authorizing Provider  acetaminophen  (TYLENOL ) 325 MG tablet Take 2 tablets (650 mg total) by mouth every 6 (six) hours as needed for moderate pain, fever or headache. 07/10/20  Yes Fausto Sor A, DO  ascorbic acid  (VITAMIN C) 500 MG tablet Take 1 tablet (500 mg total) by mouth daily. 07/10/20  Yes Fausto Sor A, DO  brimonidine   (ALPHAGAN ) 0.2 % ophthalmic solution Place 1 drop into both eyes 2 (two) times daily. 05/31/20  Yes [provider]  Cranberry 400 MG CAPS Take 400 mg by mouth daily.  03/24/20  Yes [provider]  diltiazem  (CARDIZEM  CD) 240 MG 24 hr capsule Take 240 mg by mouth daily. 11/06/23  Yes [provider]  glipiZIDE  (GLUCOTROL ) 10 MG tablet Take 10 mg by mouth daily before breakfast. 02/05/24  Yes [provider]  hydrALAZINE  (APRESOLINE ) 25 MG tablet Take 50 mg by mouth in the morning, at noon, and at bedtime. 03/28/23 03/27/24 Yes [provider]  Iron -Vitamin C (VITRON-C) 65-125 MG TABS Take 1 tablet by mouth daily.   Yes [provider]  leflunomide  (ARAVA ) 10 MG tablet Take 10 mg by mouth daily.   Yes [provider]  metoCLOPramide (REGLAN) 5 MG tablet SMARTSIG:1 Tablet(s) By Mouth Morning-Night 01/31/24  Yes [provider]  metoprolol  tartrate (LOPRESSOR ) 50 MG tablet Take 50 mg by mouth 2 (two) times daily.   Yes [provider]  omeprazole (PRILOSEC) 40 MG  capsule Take 40 mg by mouth daily. 09/11/20  Yes [provider]  Probiotic Product (PROBIOTIC-10 PO) Take 1 capsule by mouth daily.   Yes [provider]  rosuvastatin  (CRESTOR ) 10 MG tablet Take 10 mg by mouth daily.  03/24/20  Yes [provider]  sulfaSALAzine (AZULFIDINE) 500 MG EC tablet Take 500 mg by mouth daily. 02/26/24 05/26/24 Yes [provider]  torsemide (DEMADEX) 10 MG tablet Take 10 mg by mouth daily. One in morning and one in the evening 11/26/22  Yes [provider]  vitamin B-12 (CYANOCOBALAMIN ) 1000 MCG tablet Take 1,000 mcg by mouth daily.   Yes [provider]  VITAMIN D, CHOLECALCIFEROL, PO Take 1 capsule by mouth daily.   Yes [provider]  albuterol  (VENTOLIN  HFA) 108 (90 Base) MCG/ACT inhaler Inhale 1-2 puffs into the lungs every 6 (six) hours as needed for wheezing or shortness of  breath. Patient not taking: Reported on 03/22/2024    [provider]  CONTOUR TEST test strip 1 each by Other route daily.  03/09/18   [provider]  fluticasone-salmeterol (ADVAIR) 100-50 MCG/ACT AEPB Inhale 1 puff into the lungs 2 (two) times daily. Patient not taking: Reported on 03/22/2024    [provider]  Magnesium  Oxide 250 MG TABS Take 250 mg by mouth 2 (two) times daily. Patient not taking: Reported on 03/22/2024    [provider]  triamcinolone  (KENALOG ) 0.025 % ointment Apply 1 application. topically 2 (two) times daily. Patient not taking: Reported on 03/22/2024 12/22/21   Helon Kirsch A, PA-C     ALLERGIES:  Allergies  Allergen Reactions   Remicade [Infliximab] Hives, Shortness Of Breath and Itching   Ibuprofen Itching   Indomethacin Other (See Comments)    headache   Methotrexate Derivatives Other (See Comments)    Headache   Moexipril Other (See Comments)    unknown   Percocet [Oxycodone -Acetaminophen ] Itching   Naprosyn [Naproxen] Rash     SOCIAL HISTORY:  Social History   Socioeconomic History   Marital status: Married    Spouse name: [name]   Number of children: Not on file   Years of education: Not on file   Highest education level: Not on file  Occupational History   Not on file  Tobacco Use   Smoking status: Never   Smokeless tobacco: Never  Vaping Use   Vaping status: Never Used  Substance and Sexual Activity   Alcohol use: No   Drug use: No   Sexual activity: Yes  Other Topics Concern   Not on file  Social History Narrative   Not on file   Social Drivers of Health   Financial Resource Strain: Low Risk  (09/05/2023)   Received from [facility]   Overall Financial Resource Strain (CARDIA)    Difficulty of Paying Living Expenses: Not hard at all  Food Insecurity: No Food Insecurity (03/22/2024)   Hunger Vital Sign    Worried About Running Out of Food in the Last Year: Never true     Ran Out of Food in the Last Year: Never true  Transportation Needs: No Transportation Needs (03/22/2024)   PRAPARE - Transportation    Lack of Transportation (Medical): No    Lack of Transportation (Non-Medical): No  Physical Activity: Not on file  Stress: Not on file  Social Connections: Patient Declined (03/22/2024)   Social Connection and Isolation Panel    Frequency of Communication with Friends and Family: Patient declined  Frequency of Social Gatherings with Friends and Family: Patient declined    Attends Religious Services: Patient declined    Active Member of Clubs or Organizations: Patient declined    Attends Banker Meetings: Patient declined    Marital Status: Patient declined  Intimate Partner Violence: Not At Risk (03/22/2024)   Humiliation, Afraid, Rape, and Kick questionnaire    Fear of Current or Ex-Partner: No    Emotionally Abused: No    Physically Abused: No    Sexually Abused: No     FAMILY HISTORY:  Family History  Problem Relation Age of Onset   Breast cancer Neg Hx       REVIEW OF SYSTEMS:  Review of Systems  Constitutional:  Negative for chills and fever.  Respiratory:  Negative for cough and shortness of breath.   Cardiovascular:  Negative for chest pain and palpitations.  Gastrointestinal:  Positive for abdominal pain, diarrhea, nausea and vomiting.  Genitourinary:  Negative for dysuria and urgency.  All other systems reviewed and are negative.   VITAL SIGNS:  Temp:  [97.8 F (36.6 C)-99.3 F (37.4 C)] 98 F (36.7 C) (06/26 0840) Pulse Rate:  [57-66] 66 (06/26 0840) Resp:  [15-16] 15 (06/26 0445) BP: (154-164)/(57-74) 154/68 (06/26 0840) SpO2:  [94 %-96 %] 96 % (06/26 0840)     Height: 5' 6 (167.6 cm) Weight: 69.4 kg BMI (Calculated): 24.71   INTAKE/OUTPUT:  06/25 0701 - 06/26 0700 In: 100 [IV Piggyback:100] Out: -   PHYSICAL EXAM:  Physical Exam Vitals and nursing note reviewed. Exam conducted with a chaperone present.   Constitutional:      General: She is not in acute distress.    Appearance: Normal appearance. She is not ill-appearing.     Comments: Resting in bed; NAD  HENT:     Head: Normocephalic and atraumatic.     Comments: NGT in place; output bilious  Eyes:     General: No scleral icterus.    Conjunctiva/sclera: Conjunctivae normal.    Cardiovascular:     Rate and Rhythm: Normal rate.     Pulses: Normal pulses.     Heart sounds: No murmur heard. Pulmonary:     Effort: Pulmonary effort is normal. No respiratory distress.  Abdominal:     General: Abdomen is protuberant. There is distension.     Tenderness: There is no abdominal tenderness. There is no guarding.     Comments: Abdomen is markedly distended and tympanic, she does not appear overtly tender, no rebound/guarding. She is not peritonitic at this time.    Skin:    General: Skin is warm and dry.   Neurological:     General: No focal deficit present.     Mental Status: She is alert and oriented to person, place, and time.   Psychiatric:        Mood and Affect: Mood normal.        Behavior: Behavior normal.      Labs:     Latest Ref Rng & Units 03/25/2024    4:28 AM 03/24/2024    4:09 AM 03/23/2024    3:29 AM  CBC  WBC 4.0 - 10.5 K/uL 13.4  12.6  11.1   Hemoglobin 12.0 - 15.0 g/dL 9.0  9.8  8.1   Hematocrit 36.0 - 46.0 % 29.6  32.4  27.2   Platelets 150 - 400 K/uL 225  203  166       Latest Ref Rng & Units 03/25/2024  4:28 AM 03/24/2024    4:09 AM 03/23/2024    3:29 AM  CMP  Glucose 70 - 99 mg/dL 853  875  899   BUN 8 - 23 mg/dL 46  46  49   Creatinine 0.44 - 1.00 mg/dL 6.96  7.12  7.13   Sodium 135 - 145 mmol/L 133  136  134   Potassium 3.5 - 5.1 mmol/L 3.7  3.9  4.7   Chloride 98 - 111 mmol/L 107  109  108   CO2 22 - 32 mmol/L 18  19  20    Calcium  8.9 - 10.3 mg/dL 8.2  8.3  8.5   Total Protein 6.5 - 8.1 g/dL  5.5    Total Bilirubin 0.0 - 1.2 mg/dL  0.8    Alkaline Phos 38 - 126 U/L  86    AST 15 - 41 U/L   18    ALT 0 - 44 U/L  33       Imaging studies:   CT Abdomen/Pelvis (03/22/2024) personally reviewed without significant dilation of bowel, stool burden distally, no free air, and radiologist report reviewed below:  IMPRESSION: 1. No acute findings within the abdomen or pelvis. 2. Right-sided nephroureteral stent in place. Right-sided pelviectasis is identified. The right renal pelvis measures 2.2 cm on today's study versus 3.2 cm on the previous exam. No signs of high-grade obstructive uropathy. 3. Large volume of desiccated stool is noted within the distended rectum. There is gaseous distension of the left colon proximal to this level. Correlate for any clinical signs or symptoms of constipation or fecal impaction. 4. Small nodules within the left base are similar measuring up to 4 mm. 5.  Aortic Atherosclerosis (ICD10-I70.0).   KUB (03/26/2024) personally reviewed with marked bowel dilation, and radiologist report reviewed below: IMPRESSION: Diffuse gaseous distension of bowel, primarily felt to be colonic. Mildly progressive since 03/22/2024 CT. Favor colonic ileus or partial obstruction from fecal impaction, when correlated with CT.    Assessment/Plan:  83 y.o. female admitted with UTI now with abdominal distension, nausea, emesis concerning for ileus vs pSBO vs large bowel obstruction secondary to stool impaction   - Agree with NGT decompression; LIS; monitor and record output - Will get verification/positioning  XR   - She will continue to benefit from bowel regimen from below, disimpaction given significant stool burden on presenting imaging.  - I do not think she warrants any emergent surgical intervention. Her, and her husband, understand that if she worsens or fails to improve, we will need to consider this.  - NPO + IVF Support - Monitor abdominal examination; on-going bowel function - Serial KUB as needed - Pain control prn; avoid narcotics  - Antiemetics prn    - Mobilize as tolerated   - Further management per primary service; we will follow   All of the above findings and recommendations were discussed with the patient and her family (Husband), and all of patient's and her family's questions were answered to their expressed satisfaction.  Thank you for the opportunity to participate in this patient's care.   -- Arthea Platt, PA-C [facility] 03/26/2024, 2:47 PM M-F: 7am - 4pm

## 2024-03-26 NOTE — Progress Notes (Addendum)
 PROGRESS NOTE    Stephanie [name]  FMW:[id number] DOB: 06-03-1941 DOA: 03/22/2024 PCP: Cleotilde Oneil FALCON, MD   Assessment & Plan:   Principal Problem:   Sepsis secondary to UTI [facility]) Active Problems:   DM type 2 with diabetic mixed hyperlipidemia (HCC)   Rheumatoid arthritis involving multiple sites with positive rheumatoid factor (HCC)   Pure hypercholesterolemia   CKD (chronic kidney disease) stage 5, GFR less than 15 ml/min (HCC)   UPJ obstruction, acquired   Type II diabetes mellitus with renal manifestations (HCC)   Anemia due to stage 4 chronic kidney disease (HCC)  Assessment and Plan: Sepsis due to UTI: urine cx growing e. coli. Foley exchanged in the ER. Likely secondary to chronic foley. Continue on IV rocephin  x 10 days as per ID. ID following and recs apprec    Staph bacteremia: staph warneri, etiology unclear. Likely containment. Repeat blood cxs NGTD.   H/o urinary retention and hydronephrosis: foley exchanged in the ER. Hydronephrosis appears to be improved from prior s/p stent. May need to have a stent replaced, ID discussing w/ uro  Diarrhea/Liquid stools: w/ no formed stools as per pt's husband. Abd is distended & intermittent N/V. XR abd ordered to r/o ileus vs SBO    Constipation: only been having liquid stools. 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 Avoid nephrotoxic meds   ACD: likely secondary to CKD. Will transfuse if Hb < 7.0   DM2: fair control, HbA1c 7.2. Continue on SSI w/ accuchecks. Holding home glipizide     Metabolic acidosis: continue on sodium bicarb.  Likely secondary to CKD    HLD: continue on statin    HTN: continue on home dose of dilt, metoprolol , hydralazine  & clonidine  (started this admission). Will need to check orthostatic vital signs as significant concern for orthostatic hypotension after a syncopal event when working with therapy 03/25/24.   RA: holding home dose of  leflunomide  w/ active infection    COPD: w/o exacerbation. Continue on bronchodilators    Glaucoma: continue on home dose of brimonidine         DVT prophylaxis: lovenox   Code Status: full  Family Communication: discussed pt's care w/ pt's family at bedside and answered their questions  Disposition Plan: likely d/c to SNF  Level of care: Telemetry Medical  Status is: Inpatient Remains inpatient appropriate because: severity of illness, has abd distention. Needs SNF placement     Consultants:  ID  Procedures:  Antimicrobials: rocephin     Subjective: Pt c/o malaise  Objective: Vitals:   03/25/24 1424 03/25/24 2120 03/26/24 0445 03/26/24 0500  BP: (!) 147/66 (!) 158/74 (!) 155/57 (!) 164/67  Pulse: (!) 59 65 (!) 57 (!) 58  Resp: 20 16 15    Temp: 98.3 F (36.8 C) 99.3 F (37.4 C) 97.8 F (36.6 C)   TempSrc: Oral Oral    SpO2: 95% 95% 94%   Weight:      Height:        Intake/Output Summary (Last 24 hours) at 03/26/2024 0818 Last data filed at 03/26/2024 0300 Gross per 24 hour  Intake 100 ml  Output --  Net 100 ml   Filed Weights   03/22/24 1000 03/22/24 1414  Weight: 69.4 kg 69.4 kg    Examination:  General exam: Appears uncomfortable  Respiratory system: clear breath sounds b/l  Cardiovascular system: S1/S2+.  Gastrointestinal system: abd is soft, distended, tenderness to  palpation, hypoactive bowel wounds Central nervous system: Alert & awake. Moves all extremities  Psychiatry: judgement and insight appears at baseline. Flat mood and affect    Data Reviewed: I have personally reviewed following labs and imaging studies  CBC: Recent Labs  Lab 03/22/24 0850 03/23/24 0329 03/24/24 0409 03/25/24 0428  WBC 18.3* 11.1* 12.6* 13.4*  NEUTROABS 14.4*  --   --   --   HGB 9.3* 8.1* 9.8* 9.0*  HCT 30.8* 27.2* 32.4* 29.6*  MCV 97.2 96.1 96.1 95.5  PLT 208 166 203 225   Basic Metabolic Panel: Recent Labs  Lab 03/22/24 0850 03/23/24 0329  03/24/24 0409 03/25/24 0428  NA 134* 134* 136 133*  K 5.1 4.7 3.9 3.7  CL 105 108 109 107  CO2 20* 20* 19* 18*  GLUCOSE 191* 100* 124* 146*  BUN 53* 49* 46* 46*  CREATININE 3.32* 2.86* 2.87* 3.03*  CALCIUM  8.4* 8.5* 8.3* 8.2*   GFR: Estimated Creatinine Clearance: 13.6 mL/min (A) (by C-G formula based on SCr of 3.03 mg/dL (H)). Liver Function Tests: Recent Labs  Lab 03/22/24 0850 03/24/24 0409  AST 127* 18  ALT 87* 33  ALKPHOS 108 86  BILITOT 0.8 0.8  PROT 5.5* 5.5*  ALBUMIN  3.1* 2.7*   Recent Labs  Lab 03/22/24 0850  LIPASE 28   No results for input(s): AMMONIA in the last 168 hours. Coagulation Profile: No results for input(s): INR, PROTIME in the last 168 hours. Cardiac Enzymes: No results for input(s): CKTOTAL, CKMB, CKMBINDEX, TROPONINI in the last 168 hours. BNP (last 3 results) No results for input(s): PROBNP in the last 8760 hours. HbA1C: No results for input(s): HGBA1C in the last 72 hours. CBG: Recent Labs  Lab 03/24/24 2147 03/25/24 0757 03/25/24 1145 03/25/24 1652 03/25/24 2125  GLUCAP 144* 161* 216* 205* 159*   Lipid Profile: No results for input(s): CHOL, HDL, LDLCALC, TRIG, CHOLHDL, LDLDIRECT in the last 72 hours. Thyroid Function Tests: No results for input(s): TSH, T4TOTAL, FREET4, T3FREE, THYROIDAB in the last 72 hours. Anemia Panel: No results for input(s): VITAMINB12, FOLATE, FERRITIN, TIBC, IRON , RETICCTPCT in the last 72 hours. Sepsis Labs: Recent Labs  Lab 03/22/24 9061 03/22/24 1051  LATICACIDVEN 2.0* 1.4    Recent Results (from the past 240 hours)  Resp panel by RT-PCR (RSV, Flu A&B, Covid) Anterior Nasal Swab     Status: None   Collection Time: 03/22/24  8:50 AM   Specimen: Anterior Nasal Swab  Result Value Ref Range Status   SARS Coronavirus 2 by RT PCR NEGATIVE NEGATIVE Final    Comment: (NOTE) SARS-CoV-2 target nucleic acids are NOT DETECTED.  The SARS-CoV-2 RNA is  generally detectable in upper respiratory specimens during the acute phase of infection. The lowest concentration of SARS-CoV-2 viral copies this assay can detect is 138 copies/mL. A negative result does not preclude SARS-Cov-2 infection and should not be used as the sole basis for treatment or other patient management decisions. A negative result may occur with  improper specimen collection/handling, submission of specimen other than nasopharyngeal swab, presence of viral mutation(s) within the areas targeted by this assay, and inadequate number of viral copies(<138 copies/mL). A negative result must be combined with clinical observations, patient history, and epidemiological information. The expected result is Negative.  Fact Sheet for Patients:  BloggerCourse.com  Fact Sheet for Healthcare Providers:  SeriousBroker.it  This test is no t yet approved or cleared by the [place]  FDA and  has been authorized for detection and/or diagnosis  of SARS-CoV-2 by FDA under an Emergency Use Authorization (EUA). This EUA will remain  in effect (meaning this test can be used) for the duration of the COVID-19 declaration under Section 564(b)(1) of the Act, 21 U.S.C.section 360bbb-3(b)(1), unless the authorization is terminated  or revoked sooner.       Influenza A by PCR NEGATIVE NEGATIVE Final   Influenza B by PCR NEGATIVE NEGATIVE Final    Comment: (NOTE) The Xpert Xpress SARS-CoV-2/FLU/RSV plus assay is intended as an aid in the diagnosis of influenza from Nasopharyngeal swab specimens and should not be used as a sole basis for treatment. Nasal washings and aspirates are unacceptable for Xpert Xpress SARS-CoV-2/FLU/RSV testing.  Fact Sheet for Patients: BloggerCourse.com  Fact Sheet for Healthcare Providers: SeriousBroker.it  This test is not yet approved or cleared by the [place] FDA and has been authorized for detection and/or diagnosis of SARS-CoV-2 by FDA under an Emergency Use Authorization (EUA). This EUA will remain in effect (meaning this test can be used) for the duration of the COVID-19 declaration under Section 564(b)(1) of the Act, 21 U.S.C. section 360bbb-3(b)(1), unless the authorization is terminated or revoked.     Resp Syncytial Virus by PCR NEGATIVE NEGATIVE Final    Comment: (NOTE) Fact Sheet for Patients: BloggerCourse.com  Fact Sheet for Healthcare Providers: SeriousBroker.it  This test is not yet approved or cleared by the [place]  FDA and has been authorized for detection and/or diagnosis of SARS-CoV-2 by FDA under an Emergency Use Authorization (EUA). This EUA will remain in effect (meaning this test can be used) for the duration of the COVID-19 declaration under Section 564(b)(1) of the Act, 21 U.S.C. section 360bbb-3(b)(1), unless the authorization is terminated or revoked.  Performed at [facility], [address], [place], [place] [address]   Urine Culture (for pregnant, neutropenic or urologic patients or patients with an indwelling urinary catheter)     Status: Abnormal   Collection Time: 03/22/24  8:50 AM   Specimen: Urine, Catheterized  Result Value Ref Range Status   Specimen Description   Final    URINE, CATHETERIZED Performed at [facility], [address] [place], [place] [address]    Special Requests   Final    NONE Performed at [facility], [address] [place], [place] [address]    Culture >=100,000 COLONIES/mL ESCHERICHIA COLI (A)  Final   Report Status 03/24/2024 FINAL  Final   Organism ID, Bacteria ESCHERICHIA COLI (A)  Final      Susceptibility   Escherichia coli - MIC*    AMPICILLIN >=32 RESISTANT Resistant     CEFAZOLIN  RESISTANT Resistant     CEFEPIME  <=0.12 SENSITIVE Sensitive     CEFTRIAXONE  <=0.25  SENSITIVE Sensitive     CIPROFLOXACIN  >=4 RESISTANT Resistant     GENTAMICIN <=1 SENSITIVE Sensitive     IMIPENEM <=0.25 SENSITIVE Sensitive     NITROFURANTOIN  <=16 SENSITIVE Sensitive     TRIMETH /SULFA  >=320 RESISTANT Resistant     AMPICILLIN/SULBACTAM >=32 RESISTANT Resistant     PIP/TAZO 16 SENSITIVE Sensitive ug/mL    * >=100,000 COLONIES/mL ESCHERICHIA COLI  Blood culture (routine x 2)     Status: Abnormal   Collection Time: 03/22/24  9:38 AM   Specimen: BLOOD  Result Value Ref Range Status   Specimen Description   Final    BLOOD LEFT ANTECUBITAL Performed at [facility], [address], [place], [place] [address]    Special Requests   Final  BOTTLES DRAWN AEROBIC AND ANAEROBIC Blood Culture results may not be optimal due to an inadequate volume of blood received in culture bottles Performed at [facility], [address] [place], [place] [address]    Culture  Setup Time   Final    GRAM POSITIVE COCCI AEROBIC BOTTLE ONLY CRITICAL RESULT CALLED TO, READ BACK BY AND VERIFIED WITH: JASON ROBBINS 03/23/24 0445 MW    Culture (A)  Final    STAPHYLOCOCCUS WARNERI THE SIGNIFICANCE OF ISOLATING THIS ORGANISM FROM A SINGLE SET OF BLOOD CULTURES WHEN MULTIPLE SETS ARE DRAWN IS UNCERTAIN. PLEASE NOTIFY THE MICROBIOLOGY DEPARTMENT WITHIN ONE WEEK IF SPECIATION AND SENSITIVITIES ARE REQUIRED. Performed at [facility], [address] [place], [place] [address]    Report Status 03/25/2024 FINAL  Final  Blood culture (routine x 2)     Status: None (Preliminary result)   Collection Time: 03/22/24  9:38 AM   Specimen: BLOOD  Result Value Ref Range Status   Specimen Description BLOOD BLOOD LEFT ARM  Final   Special Requests   Final    BOTTLES DRAWN AEROBIC AND ANAEROBIC Blood Culture adequate volume   Culture   Final    NO GROWTH 4 DAYS Performed at [facility], [address] [place], [place] [address]    Report Status PENDING  Incomplete   Blood Culture ID Panel (Reflexed)     Status: Abnormal   Collection Time: 03/22/24  9:38 AM  Result Value Ref Range Status   Enterococcus faecalis NOT DETECTED NOT DETECTED Final   Enterococcus Faecium NOT DETECTED NOT DETECTED Final   Listeria monocytogenes NOT DETECTED NOT DETECTED Final   Staphylococcus species DETECTED (A) NOT DETECTED Final    Comment: CRITICAL RESULT CALLED TO, READ BACK BY AND VERIFIED WITH: JASON ROBBINS 03/23/24 0445 MW    Staphylococcus aureus (BCID) NOT DETECTED NOT DETECTED Final   Staphylococcus epidermidis NOT DETECTED NOT DETECTED Final   Staphylococcus lugdunensis NOT DETECTED NOT DETECTED Final   Streptococcus species NOT DETECTED NOT DETECTED Final   Streptococcus agalactiae NOT DETECTED NOT DETECTED Final   Streptococcus pneumoniae NOT DETECTED NOT DETECTED Final   Streptococcus pyogenes NOT DETECTED NOT DETECTED Final   A.calcoaceticus-baumannii NOT DETECTED NOT DETECTED Final   Bacteroides fragilis NOT DETECTED NOT DETECTED Final   Enterobacterales NOT DETECTED NOT DETECTED Final   Enterobacter cloacae complex NOT DETECTED NOT DETECTED Final   Escherichia coli NOT DETECTED NOT DETECTED Final   Klebsiella aerogenes NOT DETECTED NOT DETECTED Final   Klebsiella oxytoca NOT DETECTED NOT DETECTED Final   Klebsiella pneumoniae NOT DETECTED NOT DETECTED Final   Proteus species NOT DETECTED NOT DETECTED Final   Salmonella species NOT DETECTED NOT DETECTED Final   Serratia marcescens NOT DETECTED NOT DETECTED Final   Haemophilus influenzae NOT DETECTED NOT DETECTED Final   Neisseria meningitidis NOT DETECTED NOT DETECTED Final   Pseudomonas aeruginosa NOT DETECTED NOT DETECTED Final   Stenotrophomonas maltophilia NOT DETECTED NOT DETECTED Final   Candida albicans NOT DETECTED NOT DETECTED Final   Candida auris NOT DETECTED NOT DETECTED Final   Candida glabrata NOT DETECTED NOT DETECTED Final   Candida krusei NOT DETECTED NOT DETECTED Final   Candida  parapsilosis NOT DETECTED NOT DETECTED Final   Candida tropicalis NOT DETECTED NOT DETECTED Final   Cryptococcus neoformans/gattii NOT DETECTED NOT DETECTED Final    Comment: Performed at [facility], [address], [place], [place] [address]  MRSA Next Gen by PCR, Nasal  Status: None   Collection Time: 03/23/24  9:20 AM   Specimen: Nasal Mucosa; Nasal Swab  Result Value Ref Range Status   MRSA by PCR Next Gen NOT DETECTED NOT DETECTED Final    Comment: (NOTE) The GeneXpert MRSA Assay (FDA approved for NASAL specimens only), is one component of a comprehensive MRSA colonization surveillance program. It is not intended to diagnose MRSA infection nor to guide or monitor treatment for MRSA infections. Test performance is not FDA approved in patients less than 29 years old. Performed at [facility], [address] [place], [place] [address]   Culture, blood (single) w Reflex to ID Panel     Status: None (Preliminary result)   Collection Time: 03/23/24  4:45 PM   Specimen: BLOOD  Result Value Ref Range Status   Specimen Description BLOOD BLOOD RIGHT HAND  Final   Special Requests   Final    BOTTLES DRAWN AEROBIC AND ANAEROBIC Blood Culture adequate volume   Culture   Final    NO GROWTH 3 DAYS Performed at [facility], [address], [place], [place] [address]    Report Status PENDING  Incomplete  Culture, blood (Routine X 2) w Reflex to ID Panel     Status: None (Preliminary result)   Collection Time: 03/25/24 12:49 PM   Specimen: BLOOD  Result Value Ref Range Status   Specimen Description BLOOD BLOOD LEFT HAND  Final   Special Requests   Final    BOTTLES DRAWN AEROBIC AND ANAEROBIC Blood Culture adequate volume   Culture   Final    NO GROWTH < 24 HOURS Performed at [facility], [address] [place], [place] [address]    Report Status PENDING  Incomplete  Culture, blood (Routine X 2) w Reflex to ID Panel     Status: None  (Preliminary result)   Collection Time: 03/25/24 12:50 PM   Specimen: BLOOD  Result Value Ref Range Status   Specimen Description BLOOD BLOOD RIGHT HAND  Final   Special Requests   Final    BOTTLES DRAWN AEROBIC AND ANAEROBIC Blood Culture results may not be optimal due to an inadequate volume of blood received in culture bottles   Culture   Final    NO GROWTH < 24 HOURS Performed at [facility], [address] [place], [place] [address]    Report Status PENDING  Incomplete         Radiology Studies: No results found.      Scheduled Meds:  brimonidine   1 drop Both Eyes BID   Chlorhexidine  Gluconate Cloth  6 each Topical Daily   cloNIDine   0.1 mg Oral Daily   diltiazem   300 mg Oral Daily   enoxaparin  (LOVENOX ) injection  30 mg Subcutaneous Q24H   hydrALAZINE   50 mg Oral Q8H   insulin  aspart  0-9 Units Subcutaneous TID WC   metoCLOPramide  5 mg Oral BID   metoprolol  tartrate  50 mg Oral BID   pantoprazole   40 mg Oral Daily   polyethylene glycol  17 g Oral Daily   rosuvastatin   10 mg Oral Daily   sodium bicarbonate   650 mg Oral BID   sodium chloride  flush  3 mL Intravenous Q12H   torsemide  10 mg Oral Daily   Continuous Infusions:  cefTRIAXone  (ROCEPHIN )  IV Stopped (03/25/24 2200)     LOS: 4 days       Anthony CHRISTELLA Pouch, MD [facility] Pager [phone]  If  7PM-7AM, please contact night-coverage [email or web address] 03/26/2024, 8:18 AM

## 2024-03-26 NOTE — Progress Notes (Signed)
 PT Cancellation Note  Patient Details Name: [name] MRN: [id number] DOB: 17-Jan-1941   Cancelled Treatment:    Reason Eval/Treat Not Completed: Other (comment).  Nurse recommending holding PT at this time; plan to insert gastric tube (nasal) soon.  Will re-attempt PT session tomorrow.  Damien Caulk, PT 03/26/24, 3:00 PM

## 2024-03-26 NOTE — Progress Notes (Signed)
 OT Cancellation Note  Patient Details Name: SUSETTE SEMINARA MRN: [id number] DOB: [birth year]   Cancelled Treatment:    Reason Eval/Treat Not Completed: Medical issues which prohibited therapy. Discussed with PT; RN recommending hold a tthis time due to gastric tube insertion. Will re-attempt OT at a later date.  Rue Tinnel L. Rhett Mutschler, OTR/L  03/26/24, 3:40 PM

## 2024-03-26 NOTE — TOC Progression Note (Addendum)
 Transition of Care [facility]) - Progression Note    Patient Details  Name: CRISSA SOWDER MRN: [id number] Date of Birth: 12-25-1940  [facility]) CM/SW Contact  Lauraine JAYSON Carpen, LCSW Phone Number: 03/26/2024, 11:16 AM  Clinical Narrative:  Husband has not chosen a SNF yet. He asked that [facility] and [facility] review. [facility] has already declined but [facility] will review referral.   12:24 pm: [facility] is unable to offer a bed. Husband is aware.  Expected Discharge Plan: Skilled Nursing Facility Barriers to Discharge: Continued Medical Work up  Expected Discharge Plan and Services     Post Acute Care Choice: Skilled Nursing Facility Living arrangements for the past 2 months: Single Family Home                                       Social Determinants of Health (SDOH) Interventions SDOH Screenings   Food Insecurity: No Food Insecurity (03/22/2024)  Housing: High Risk (03/22/2024)  Transportation Needs: No Transportation Needs (03/22/2024)  Utilities: Not At Risk (03/22/2024)  Depression (PHQ2-9): Low Risk  (03/27/2023)  Financial Resource Strain: Low Risk  (09/05/2023)   [facility] from [facility]  Social Connections: Patient Declined (03/22/2024)  Tobacco Use: Low Risk  (03/22/2024)  Recent Concern: Tobacco Use - Medium Risk (02/26/2024)   Received from [facility]    Readmission Risk Interventions    03/24/2024    4:33 PM 05/02/2022   10:33 AM 04/29/2022    9:58 AM  Readmission Risk Prevention Plan  Transportation Screening   Complete  PCP or Specialist Appt within 3-5 Days Complete  Complete  HRI or Home Care Consult   Complete  Social Work Consult for Recovery Care Planning/Counseling Complete  Complete  Palliative Care Screening Not Applicable Complete Not Applicable  Medication Review Oceanographer)   Complete

## 2024-03-26 NOTE — Progress Notes (Signed)
 INFECTIOUS DISEASE PROGRESS NOTE Date of Admission:  03/22/2024     ID: Stephanie [name] is a 83 y.o. female with UTI, bacteremia Principal Problem:   Sepsis secondary to UTI [facility]) Active Problems:   DM type 2 with diabetic mixed hyperlipidemia (HCC)   Rheumatoid arthritis involving multiple sites with positive rheumatoid factor (HCC)   Pure hypercholesterolemia   CKD (chronic kidney disease) stage 5, GFR less than 15 ml/min (HCC)   UPJ obstruction, acquired   Type II diabetes mellitus with renal manifestations (HCC)   Anemia due to stage 4 chronic kidney disease (HCC)   Subjective: Some vomiting, abd distention. Remains weak and sleepy  ROS  Eleven systems are reviewed and negative except per hpi  Medications:  Antibiotics Given (last 72 hours)     Date/Time Action Medication Dose Rate   03/23/24 2212 New Bag/Given   meropenem  (MERREM ) 500 mg in sodium chloride  0.9 % 100 mL IVPB 500 mg 200 mL/hr   03/24/24 1113 New Bag/Given   meropenem  (MERREM ) 500 mg in sodium chloride  0.9 % 100 mL IVPB 500 mg 200 mL/hr   03/24/24 2119 New Bag/Given   cefTRIAXone  (ROCEPHIN ) 2 g in sodium chloride  0.9 % 100 mL IVPB 2 g 200 mL/hr   03/25/24 2125 New Bag/Given   cefTRIAXone  (ROCEPHIN ) 2 g in sodium chloride  0.9 % 100 mL IVPB 2 g 200 mL/hr       brimonidine   1 drop Both Eyes BID   Chlorhexidine  Gluconate Cloth  6 each Topical Daily   cloNIDine   0.1 mg Oral Daily   diltiazem   300 mg Oral Daily   enoxaparin  (LOVENOX ) injection  30 mg Subcutaneous Q24H   hydrALAZINE   50 mg Oral Q8H   insulin  aspart  0-9 Units Subcutaneous TID WC   metoCLOPramide   5 mg Oral BID   metoprolol  tartrate  50 mg Oral BID   pantoprazole   40 mg Oral Daily   polyethylene glycol  17 g Oral Daily   rosuvastatin   10 mg Oral Daily   sodium bicarbonate   650 mg Oral BID   sodium chloride  flush  3 mL Intravenous Q12H   torsemide   10 mg Oral Daily    Objective: Vital signs in last 24 hours: Temp:  [97.8 F (36.6  C)-99.3 F (37.4 C)] 98 F (36.7 C) (06/26 0840) Pulse Rate:  [57-66] 66 (06/26 0840) Resp:  [15-20] 15 (06/26 0445) BP: (147-164)/(57-74) 154/68 (06/26 0840) SpO2:  [94 %-96 %] 96 % (06/26 0840) GEN sleeping in bed RRR CTAB Abd mod distention, tympanic. Foley in place  Lab Results Recent Labs    03/24/24 0409 03/25/24 0428  WBC 12.6* 13.4*  HGB 9.8* 9.0*  HCT 32.4* 29.6*  NA 136 133*  K 3.9 3.7  CL 109 107  CO2 19* 18*  BUN 46* 46*  CREATININE 2.87* 3.03*    Microbiology: Results for orders placed or performed during the hospital encounter of 03/22/24  Resp panel by RT-PCR (RSV, Flu A&B, Covid) Anterior Nasal Swab     Status: None   Collection Time: 03/22/24  8:50 AM   Specimen: Anterior Nasal Swab  Result Value Ref Range Status   SARS Coronavirus 2 by RT PCR NEGATIVE NEGATIVE Final    Comment: (NOTE) SARS-CoV-2 target nucleic acids are NOT DETECTED.  The SARS-CoV-2 RNA is generally detectable in upper respiratory specimens during the acute phase of infection. The lowest concentration of SARS-CoV-2 viral copies this assay can detect is 138 copies/mL. A negative result does  not preclude SARS-Cov-2 infection and should not be used as the sole basis for treatment or other patient management decisions. A negative result may occur with  improper specimen collection/handling, submission of specimen other than nasopharyngeal swab, presence of viral mutation(s) within the areas targeted by this assay, and inadequate number of viral copies(<138 copies/mL). A negative result must be combined with clinical observations, patient history, and epidemiological information. The expected result is Negative.  Fact Sheet for Patients:  BloggerCourse.com  Fact Sheet for Healthcare Providers:  SeriousBroker.it  This test is no t yet approved or cleared by the [place]  FDA and  has been authorized for detection and/or  diagnosis of SARS-CoV-2 by FDA under an Emergency Use Authorization (EUA). This EUA will remain  in effect (meaning this test can be used) for the duration of the COVID-19 declaration under Section 564(b)(1) of the Act, 21 U.S.C.section 360bbb-3(b)(1), unless the authorization is terminated  or revoked sooner.       Influenza A by PCR NEGATIVE NEGATIVE Final   Influenza B by PCR NEGATIVE NEGATIVE Final    Comment: (NOTE) The Xpert Xpress SARS-CoV-2/FLU/RSV plus assay is intended as an aid in the diagnosis of influenza from Nasopharyngeal swab specimens and should not be used as a sole basis for treatment. Nasal washings and aspirates are unacceptable for Xpert Xpress SARS-CoV-2/FLU/RSV testing.  Fact Sheet for Patients: BloggerCourse.com  Fact Sheet for Healthcare Providers: SeriousBroker.it  This test is not yet approved or cleared by the [place]  FDA and has been authorized for detection and/or diagnosis of SARS-CoV-2 by FDA under an Emergency Use Authorization (EUA). This EUA will remain in effect (meaning this test can be used) for the duration of the COVID-19 declaration under Section 564(b)(1) of the Act, 21 U.S.C. section 360bbb-3(b)(1), unless the authorization is terminated or revoked.     Resp Syncytial Virus by PCR NEGATIVE NEGATIVE Final    Comment: (NOTE) Fact Sheet for Patients: BloggerCourse.com  Fact Sheet for Healthcare Providers: SeriousBroker.it  This test is not yet approved or cleared by the [place]  FDA and has been authorized for detection and/or diagnosis of SARS-CoV-2 by FDA under an Emergency Use Authorization (EUA). This EUA will remain in effect (meaning this test can be used) for the duration of the COVID-19 declaration under Section 564(b)(1) of the Act, 21 U.S.C. section 360bbb-3(b)(1), unless the authorization is terminated  or revoked.  Performed at [facility], [address] [place], [place] [address]   Urine Culture (for pregnant, neutropenic or urologic patients or patients with an indwelling urinary catheter)     Status: Abnormal   Collection Time: 03/22/24  8:50 AM   Specimen: Urine, Catheterized  Result Value Ref Range Status   Specimen Description   Final    URINE, CATHETERIZED Performed at [facility], [address] [place], [place] [address]    Special Requests   Final    NONE Performed at [facility], [address] [place], [place] [address]    Culture >=100,000 COLONIES/mL ESCHERICHIA COLI (A)  Final   Report Status 03/24/2024 FINAL  Final   Organism ID, Bacteria ESCHERICHIA COLI (A)  Final      Susceptibility   Escherichia coli - MIC*    AMPICILLIN >=32 RESISTANT Resistant     CEFAZOLIN  RESISTANT Resistant     CEFEPIME  <=0.12 SENSITIVE Sensitive     CEFTRIAXONE  <=0.25 SENSITIVE Sensitive     CIPROFLOXACIN  >=4 RESISTANT Resistant     GENTAMICIN <=1 SENSITIVE Sensitive  IMIPENEM <=0.25 SENSITIVE Sensitive     NITROFURANTOIN  <=16 SENSITIVE Sensitive     TRIMETH /SULFA  >=320 RESISTANT Resistant     AMPICILLIN/SULBACTAM >=32 RESISTANT Resistant     PIP/TAZO 16 SENSITIVE Sensitive ug/mL    * >=100,000 COLONIES/mL ESCHERICHIA COLI  Blood culture (routine x 2)     Status: Abnormal   Collection Time: 03/22/24  9:38 AM   Specimen: BLOOD  Result Value Ref Range Status   Specimen Description   Final    BLOOD LEFT ANTECUBITAL Performed at [facility], [address] [place], [place] [address]    Special Requests   Final    BOTTLES DRAWN AEROBIC AND ANAEROBIC Blood Culture results may not be optimal due to an inadequate volume of blood received in culture bottles Performed at [facility], [address] [place], [place] [address]    Culture  Setup Time   Final    GRAM POSITIVE COCCI AEROBIC BOTTLE ONLY CRITICAL RESULT CALLED  TO, READ BACK BY AND VERIFIED WITH: JASON ROBBINS 03/23/24 0445 MW    Culture (A)  Final    STAPHYLOCOCCUS WARNERI THE SIGNIFICANCE OF ISOLATING THIS ORGANISM FROM A SINGLE SET OF BLOOD CULTURES WHEN MULTIPLE SETS ARE DRAWN IS UNCERTAIN. PLEASE NOTIFY THE MICROBIOLOGY DEPARTMENT WITHIN ONE WEEK IF SPECIATION AND SENSITIVITIES ARE REQUIRED. Performed at [facility], [address] [place] [address] [place], [place] [address]    Report Status 03/25/2024 FINAL  Final  Blood culture (routine x 2)     Status: None (Preliminary result)   Collection Time: 03/22/24  9:38 AM   Specimen: BLOOD  Result Value Ref Range Status   Specimen Description BLOOD BLOOD LEFT ARM  Final   Special Requests   Final    BOTTLES DRAWN AEROBIC AND ANAEROBIC Blood Culture adequate volume   Culture   Final    NO GROWTH 4 DAYS Performed at [facility], [address], [place], [place] [address]    Report Status PENDING  Incomplete  Blood Culture ID Panel (Reflexed)     Status: Abnormal   Collection Time: 03/22/24  9:38 AM  Result Value Ref Range Status   Enterococcus faecalis NOT DETECTED NOT DETECTED Final   Enterococcus Faecium NOT DETECTED NOT DETECTED Final   Listeria monocytogenes NOT DETECTED NOT DETECTED Final   Staphylococcus species DETECTED (A) NOT DETECTED Final    Comment: CRITICAL RESULT CALLED TO, READ BACK BY AND VERIFIED WITH: JASON ROBBINS 03/23/24 0445 MW    Staphylococcus aureus (BCID) NOT DETECTED NOT DETECTED Final   Staphylococcus epidermidis NOT DETECTED NOT DETECTED Final   Staphylococcus lugdunensis NOT DETECTED NOT DETECTED Final   Streptococcus species NOT DETECTED NOT DETECTED Final   Streptococcus agalactiae NOT DETECTED NOT DETECTED Final   Streptococcus pneumoniae NOT DETECTED NOT DETECTED Final   Streptococcus pyogenes NOT DETECTED NOT DETECTED Final   A.calcoaceticus-baumannii NOT DETECTED NOT DETECTED Final   Bacteroides fragilis NOT DETECTED NOT DETECTED Final    Enterobacterales NOT DETECTED NOT DETECTED Final   Enterobacter cloacae complex NOT DETECTED NOT DETECTED Final   Escherichia coli NOT DETECTED NOT DETECTED Final   Klebsiella aerogenes NOT DETECTED NOT DETECTED Final   Klebsiella oxytoca NOT DETECTED NOT DETECTED Final   Klebsiella pneumoniae NOT DETECTED NOT DETECTED Final   Proteus species NOT DETECTED NOT DETECTED Final   Salmonella species NOT DETECTED NOT DETECTED Final   Serratia marcescens NOT DETECTED NOT DETECTED Final   Haemophilus influenzae NOT DETECTED NOT DETECTED Final   Neisseria  meningitidis NOT DETECTED NOT DETECTED Final   Pseudomonas aeruginosa NOT DETECTED NOT DETECTED Final   Stenotrophomonas maltophilia NOT DETECTED NOT DETECTED Final   Candida albicans NOT DETECTED NOT DETECTED Final   Candida auris NOT DETECTED NOT DETECTED Final   Candida glabrata NOT DETECTED NOT DETECTED Final   Candida krusei NOT DETECTED NOT DETECTED Final   Candida parapsilosis NOT DETECTED NOT DETECTED Final   Candida tropicalis NOT DETECTED NOT DETECTED Final   Cryptococcus neoformans/gattii NOT DETECTED NOT DETECTED Final    Comment: Performed at [facility], [address] [place], [place] [address]  MRSA Next Gen by PCR, Nasal     Status: None   Collection Time: 03/23/24  9:20 AM   Specimen: Nasal Mucosa; Nasal Swab  Result Value Ref Range Status   MRSA by PCR Next Gen NOT DETECTED NOT DETECTED Final    Comment: (NOTE) The GeneXpert MRSA Assay (FDA approved for NASAL specimens only), is one component of a comprehensive MRSA colonization surveillance program. It is not intended to diagnose MRSA infection nor to guide or monitor treatment for MRSA infections. Test performance is not FDA approved in patients less than 77 years old. Performed at [facility], [address] [place], [place] [address]   Culture, blood (single) w Reflex to ID Panel     Status: None (Preliminary result)   Collection Time:  03/23/24  4:45 PM   Specimen: BLOOD  Result Value Ref Range Status   Specimen Description BLOOD BLOOD RIGHT HAND  Final   Special Requests   Final    BOTTLES DRAWN AEROBIC AND ANAEROBIC Blood Culture adequate volume   Culture   Final    NO GROWTH 3 DAYS Performed at [facility], [address] [place], [place] [address]    Report Status PENDING  Incomplete  Culture, blood (Routine X 2) w Reflex to ID Panel     Status: None (Preliminary result)   Collection Time: 03/25/24 12:49 PM   Specimen: BLOOD  Result Value Ref Range Status   Specimen Description BLOOD BLOOD LEFT HAND  Final   Special Requests   Final    BOTTLES DRAWN AEROBIC AND ANAEROBIC Blood Culture adequate volume   Culture   Final    NO GROWTH < 24 HOURS Performed at [facility], [address], [place], [place] [address]    Report Status PENDING  Incomplete  Culture, blood (Routine X 2) w Reflex to ID Panel     Status: None (Preliminary result)   Collection Time: 03/25/24 12:50 PM   Specimen: BLOOD  Result Value Ref Range Status   Specimen Description BLOOD BLOOD RIGHT HAND  Final   Special Requests   Final    BOTTLES DRAWN AEROBIC AND ANAEROBIC Blood Culture results may not be optimal due to an inadequate volume of blood received in culture bottles   Culture   Final    NO GROWTH < 24 HOURS Performed at [facility], [address] [place], [place] [address]    Report Status PENDING  Incomplete    Studies/Results: DG Abd 2 Views Result Date: 03/26/2024 CLINICAL DATA:  Abdominal distension EXAM: ABDOMEN - 2 VIEW COMPARISON:  CT 03/22/2024. FINDINGS: Upright supine views. The upright view includes the lower chest and upper abdomen. No free intraperitoneal air. Moderate right hemidiaphragm elevation. Thoracolumbar spine fixation. Supine views demonstrate gaseous distension of bowel, primarily felt to represent colon. Compared to the scout view of the 03/22/2024 CT, mildly progressive.  Right ureteric stent. IMPRESSION: Diffuse gaseous distension of bowel, primarily felt to be colonic. Mildly progressive since 03/22/2024 CT. Favor colonic ileus or partial obstruction from fecal impaction, when correlated with CT. Electronically Signed   By: Rockey Kilts M.D.   On: 03/26/2024 14:06    Assessment/Plan: Stephanie [name] is a 83 y.o. 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  In the ED she was found to have no fever but a white count of 18.  Lactic acid was elevated at 2.  Creatinine was around her baseline 3.  Urine sample showed moderate leukocyte esterase greater than 50 whites urine cultures pending. She had a CT scan done that showed stent in place on the right side nephroureteral stent but no signs of high-grade obstructive uropathy.  There was desiccated stool large volume and distended rectum.  Chest x-ray was normal.  Lower extremity Doppler was negative. Since then blood cultures have returned positive with 1 of 2 growing gram-positive cocci identified as staph species not Staph aureus. UCX > 100 K GNR She also has progressive decline per her husband over last few weeks. Has progressive anemia.      6/24- Ucx with E coli, relatively sensitive. BCX ID pending 6/25- cx with S warneri - likely contaminant FU bcx 6/23 negaitve  Recommendations UTI - in pt with chronic foley and nephrouteral stent chronically. She has complicated history and has had stent in place and chronic foley for several years. Per her husband was on chronic suppressive abx several years ago with Dr Ike but since having foley cath placed has only had a few UTIs and not on recent abx Cont ctx  Will likely need 10 days treatment for complicated UTI given stent and foley in place and high risk of recurrences.  Discussed with husband Messaged urology re considering changing stent once we sterilize the urine vs trying to treat  her through and only change stent if recurs Can place picc line since bcx 6/23 neg  Bacteremia- 1/2 sets staph warneri -  I suspect contaminant. No pacemaker or lines. Repeat bcx NGTD .    Possible impaction- noted on CT = husband says has BMs every few days.  Rectal exam done by me 6/25 had no stool in rectal vault  Progressive decline at home for last several weeks   Progressive anemia - unclear etiology.    Thank you very much for the consult. Will follow with you.  Stephanie [name]   03/26/2024, 2:14 PM

## 2024-03-26 NOTE — Plan of Care (Signed)
  Problem: Education: Goal: Ability to describe self-care measures that may prevent or decrease complications (Diabetes Survival Skills Education) will improve Outcome: Progressing   Problem: Coping: Goal: Ability to adjust to condition or change in health will improve Outcome: Progressing   Problem: Health Behavior/Discharge Planning: Goal: Ability to identify and utilize available resources and services will improve Outcome: Progressing   Problem: Health Behavior/Discharge Planning: Goal: Ability to manage health-related needs will improve Outcome: Progressing   Problem: Nutritional: Goal: Maintenance of adequate nutrition will improve Outcome: Progressing   Problem: Skin Integrity: Goal: Risk for impaired skin integrity will decrease Outcome: Progressing   Problem: Fluid Volume: Goal: Hemodynamic stability will improve Outcome: Progressing   Problem: Clinical Measurements: Goal: Will remain free from infection Outcome: Progressing   Problem: Activity: Goal: Risk for activity intolerance will decrease Outcome: Progressing   Problem: Coping: Goal: Level of anxiety will decrease Outcome: Progressing

## 2024-03-27 ENCOUNTER — Inpatient Hospital Stay

## 2024-03-27 DIAGNOSIS — K567 Ileus, unspecified: Secondary | ICD-10-CM | POA: Diagnosis not present

## 2024-03-27 DIAGNOSIS — A419 Sepsis, unspecified organism: Secondary | ICD-10-CM | POA: Diagnosis not present

## 2024-03-27 DIAGNOSIS — K56609 Unspecified intestinal obstruction, unspecified as to partial versus complete obstruction: Secondary | ICD-10-CM

## 2024-03-27 DIAGNOSIS — N390 Urinary tract infection, site not specified: Secondary | ICD-10-CM | POA: Diagnosis not present

## 2024-03-27 LAB — GLUCOSE, CAPILLARY
Glucose-Capillary: 156 mg/dL — ABNORMAL HIGH (ref 70–99)
Glucose-Capillary: 109 mg/dL — ABNORMAL HIGH (ref 70–99)
Glucose-Capillary: 97 mg/dL (ref 70–99)

## 2024-03-27 LAB — BASIC METABOLIC PANEL WITH GFR
Anion gap: 12 (ref 5–15)
BUN: 61 mg/dL — ABNORMAL HIGH (ref 8–23)
CO2: 21 mmol/L — ABNORMAL LOW (ref 22–32)
Calcium: 8 mg/dL — ABNORMAL LOW (ref 8.9–10.3)
Chloride: 102 mmol/L (ref 98–111)
Creatinine, Ser: 3.91 mg/dL — ABNORMAL HIGH (ref 0.44–1.00)
GFR, Estimated: 11 mL/min — ABNORMAL LOW (ref >60)
Glucose, Bld: 145 mg/dL — ABNORMAL HIGH (ref 70–99)
Potassium: 3.2 mmol/L — ABNORMAL LOW (ref 3.5–5.1)
Sodium: 135 mmol/L (ref 135–145)

## 2024-03-27 LAB — CULTURE, BLOOD (ROUTINE X 2)
Culture: NO GROWTH
Special Requests: ADEQUATE

## 2024-03-27 LAB — CBC
HCT: 29.3 % — ABNORMAL LOW (ref 36.0–46.0)
Hemoglobin: 9 g/dL — ABNORMAL LOW (ref 12.0–15.0)
MCH: 28.7 pg (ref 26.0–34.0)
MCHC: 30.7 g/dL (ref 30.0–36.0)
MCV: 93.3 fL (ref 80.0–100.0)
Platelets: 291 10*3/uL (ref 150–400)
RBC: 3.14 MIL/uL — ABNORMAL LOW (ref 3.87–5.11)
RDW: 14.9 % (ref 11.5–15.5)
WBC: 13.4 10*3/uL — ABNORMAL HIGH (ref 4.0–10.5)
nRBC: 0 % (ref 0.0–0.2)

## 2024-03-27 LAB — MAGNESIUM: Magnesium: 2.7 mg/dL — ABNORMAL HIGH (ref 1.7–2.4)

## 2024-03-27 MED ORDER — SODIUM CHLORIDE 0.9 % IV SOLN: INTRAVENOUS | Status: AC

## 2024-03-27 MED ORDER — POTASSIUM CHLORIDE CRYS ER 20 MEQ PO TBCR
20.0000 meq | EXTENDED_RELEASE_TABLET | Freq: Once | ORAL | Status: AC
  Administered 2024-03-27: 20 meq via ORAL
  Filled 2024-03-27: qty 1

## 2024-03-27 MED ORDER — FLEET ENEMA RE ENEM
1.0000 | ENEMA | Freq: Two times a day (BID) | RECTAL | Status: DC
  Administered 2024-03-27 – 2024-03-28 (×4): 1 via RECTAL

## 2024-03-27 MED ORDER — PHENOL 1.4 % MT LIQD
1.0000 | OROMUCOSAL | Status: DC | PRN
  Administered 2024-03-27 – 2024-03-29 (×4): 1 via OROMUCOSAL
  Filled 2024-03-27: qty 177

## 2024-03-27 NOTE — Progress Notes (Addendum)
 PROGRESS NOTE    ECE CUMBERLAND  FMW:[id number] DOB: 1941/06/09 DOA: 03/22/2024 PCP: Cleotilde Oneil FALCON, MD   Assessment & Plan:   Principal Problem:   Sepsis secondary to UTI [facility]) Active Problems:   DM type 2 with diabetic mixed hyperlipidemia (HCC)   Rheumatoid arthritis involving multiple sites with positive rheumatoid factor (HCC)   Pure hypercholesterolemia   CKD (chronic kidney disease) stage 5, GFR less than 15 ml/min (HCC)   UPJ obstruction, acquired   Type II diabetes mellitus with renal manifestations (HCC)   Anemia due to stage 4 chronic kidney disease (HCC)  Assessment and Plan: Sepsis due to UTI: urine cx growing e. coli. Foley exchanged in the ER. Likely secondary to chronic foley. Continue on IV rocephin  x 10 days as per ID. ID following and recs apprec    Staph bacteremia: staph warneri, etiology unclear. Likely containment. Repeat blood cxs NGTD   H/o urinary retention and hydronephrosis: foley exchanged in the ER. Hydronephrosis appears to be improved from prior s/p stent. May need to have a stent replaced vs having stent replaced if another infection occurs   Partial SBO: as per XR. NPO. Continue w/ NG tube as per gen surg. 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 Will consider nephro consult if Cr continues to rise  ACD: likely secondary to CKD. No need for a transfusion currently   DM2: fair control, HbA1c 7.2. Continue on SSI w/ accuchecks. Holding home glipizide     Metabolic acidosis: continue on sodium bicarb. Likely secondary to CKD    HLD: continue on statin    HTN: continue on home dose of metoprolol , diltiazem , hydralazine  & clonidine  (started this admission). Will need to check orthostatic vital signs as significant concern for orthostatic hypotension after a syncopal event when working with therapy 03/25/24.   RA: holding home dose of leflunomide  w/ active infection    COPD: w/o  exacerbation. Continue on bronchodilators     Glaucoma: continue on home dose of brimonidine         DVT prophylaxis: lovenox   Code Status: full  Family Communication: discussed pt's care w/ pt's family at bedside and answered their questions  Disposition Plan: likely d/c to SNF  Level of care: Telemetry Medical  Status is: Inpatient Remains inpatient appropriate because: severity of illness, has a SBO. Needs SNF placement     Consultants:  ID Gen surg   Procedures:  Antimicrobials: rocephin     Subjective: Pt c/o sore throat   Objective: Vitals:   03/26/24 2237 03/27/24 0516 03/27/24 0518 03/27/24 0812  BP: (!) 157/64 (!) 148/58 (!) 131/55 (!) 145/46  Pulse:  60 61 60  Resp:  15  18  Temp:  98 F (36.7 C)  98.1 F (36.7 C)  TempSrc:  Oral  Oral  SpO2:  93%  90%  Weight:      Height:        Intake/Output Summary (Last 24 hours) at 03/27/2024 1019 Last data filed at 03/27/2024 0649 Gross per 24 hour  Intake 650 ml  Output 1600 ml  Net -950 ml   Filed Weights   03/22/24 1000 03/22/24 1414  Weight: 69.4 kg 69.4 kg    Examination:  General exam: appears calm but uncomfortable  Respiratory system: clear breath sounds b/l  Cardiovascular system: S1 & S2+. No rubs or clicks  Gastrointestinal system: abd is soft, NT, ND, hypoactive bowel sounds Central  nervous system: alert & awake. Moves all extremities  Psychiatry: judgement and insight appears at baseline. Flat mood and affect     Data Reviewed: I have personally reviewed following labs and imaging studies  CBC: Recent Labs  Lab 03/22/24 0850 03/23/24 0329 03/24/24 0409 03/25/24 0428 03/27/24 0439  WBC 18.3* 11.1* 12.6* 13.4* 13.4*  NEUTROABS 14.4*  --   --   --   --   HGB 9.3* 8.1* 9.8* 9.0* 9.0*  HCT 30.8* 27.2* 32.4* 29.6* 29.3*  MCV 97.2 96.1 96.1 95.5 93.3  PLT 208 166 203 225 291   Basic Metabolic Panel: Recent Labs  Lab 03/22/24 0850 03/23/24 0329 03/24/24 0409 03/25/24 0428  03/27/24 0439  NA 134* 134* 136 133* 135  K 5.1 4.7 3.9 3.7 3.2*  CL 105 108 109 107 102  CO2 20* 20* 19* 18* 21*  GLUCOSE 191* 100* 124* 146* 145*  BUN 53* 49* 46* 46* 61*  CREATININE 3.32* 2.86* 2.87* 3.03* 3.91*  CALCIUM  8.4* 8.5* 8.3* 8.2* 8.0*  MG  --   --   --   --  2.7*   GFR: Estimated Creatinine Clearance: 10.6 mL/min (A) (by C-G formula based on SCr of 3.91 mg/dL (H)). Liver Function Tests: Recent Labs  Lab 03/22/24 0850 03/24/24 0409  AST 127* 18  ALT 87* 33  ALKPHOS 108 86  BILITOT 0.8 0.8  PROT 5.5* 5.5*  ALBUMIN  3.1* 2.7*   Recent Labs  Lab 03/22/24 0850  LIPASE 28   No results for input(s): AMMONIA in the last 168 hours. Coagulation Profile: No results for input(s): INR, PROTIME in the last 168 hours. Cardiac Enzymes: No results for input(s): CKTOTAL, CKMB, CKMBINDEX, TROPONINI in the last 168 hours. BNP (last 3 results) No results for input(s): PROBNP in the last 8760 hours. HbA1C: No results for input(s): HGBA1C in the last 72 hours. CBG: Recent Labs  Lab 03/26/24 0839 03/26/24 1135 03/26/24 1806 03/26/24 2039 03/27/24 0813  GLUCAP 169* 147* 171* 145* 156*   Lipid Profile: No results for input(s): CHOL, HDL, LDLCALC, TRIG, CHOLHDL, LDLDIRECT in the last 72 hours. Thyroid Function Tests: No results for input(s): TSH, T4TOTAL, FREET4, T3FREE, THYROIDAB in the last 72 hours. Anemia Panel: No results for input(s): VITAMINB12, FOLATE, FERRITIN, TIBC, IRON , RETICCTPCT in the last 72 hours. Sepsis Labs: Recent Labs  Lab 03/22/24 9061 03/22/24 1051  LATICACIDVEN 2.0* 1.4    Recent Results (from the past 240 hours)  Resp panel by RT-PCR (RSV, Flu A&B, Covid) Anterior Nasal Swab     Status: None   Collection Time: 03/22/24  8:50 AM   Specimen: Anterior Nasal Swab  Result Value Ref Range Status   SARS Coronavirus 2 by RT PCR NEGATIVE NEGATIVE Final    Comment: (NOTE) SARS-CoV-2 target  nucleic acids are NOT DETECTED.  The SARS-CoV-2 RNA is generally detectable in upper respiratory specimens during the acute phase of infection. The lowest concentration of SARS-CoV-2 viral copies this assay can detect is 138 copies/mL. A negative result does not preclude SARS-Cov-2 infection and should not be used as the sole basis for treatment or other patient management decisions. A negative result may occur with  improper specimen collection/handling, submission of specimen other than nasopharyngeal swab, presence of viral mutation(s) within the areas targeted by this assay, and inadequate number of viral copies(<138 copies/mL). A negative result must be combined with clinical observations, patient history, and epidemiological information. The expected result is Negative.  Fact Sheet for Patients:  BloggerCourse.com  Fact Sheet for Healthcare Providers:  SeriousBroker.it  This test is no t yet approved or cleared by the [place]  FDA and  has been authorized for detection and/or diagnosis of SARS-CoV-2 by FDA under an Emergency Use Authorization (EUA). This EUA will remain  in effect (meaning this test can be used) for the duration of the COVID-19 declaration under Section 564(b)(1) of the Act, 21 U.S.C.section 360bbb-3(b)(1), unless the authorization is terminated  or revoked sooner.       Influenza A by PCR NEGATIVE NEGATIVE Final   Influenza B by PCR NEGATIVE NEGATIVE Final    Comment: (NOTE) The Xpert Xpress SARS-CoV-2/FLU/RSV plus assay is intended as an aid in the diagnosis of influenza from Nasopharyngeal swab specimens and should not be used as a sole basis for treatment. Nasal washings and aspirates are unacceptable for Xpert Xpress SARS-CoV-2/FLU/RSV testing.  Fact Sheet for Patients: BloggerCourse.com  Fact Sheet for Healthcare  Providers: SeriousBroker.it  This test is not yet approved or cleared by the [place]  FDA and has been authorized for detection and/or diagnosis of SARS-CoV-2 by FDA under an Emergency Use Authorization (EUA). This EUA will remain in effect (meaning this test can be used) for the duration of the COVID-19 declaration under Section 564(b)(1) of the Act, 21 U.S.C. section 360bbb-3(b)(1), unless the authorization is terminated or revoked.     Resp Syncytial Virus by PCR NEGATIVE NEGATIVE Final    Comment: (NOTE) Fact Sheet for Patients: BloggerCourse.com  Fact Sheet for Healthcare Providers: SeriousBroker.it  This test is not yet approved or cleared by the [place]  FDA and has been authorized for detection and/or diagnosis of SARS-CoV-2 by FDA under an Emergency Use Authorization (EUA). This EUA will remain in effect (meaning this test can be used) for the duration of the COVID-19 declaration under Section 564(b)(1) of the Act, 21 U.S.C. section 360bbb-3(b)(1), unless the authorization is terminated or revoked.  Performed at [facility], [address], [place], [place] [address]   Urine Culture (for pregnant, neutropenic or urologic patients or patients with an indwelling urinary catheter)     Status: Abnormal   Collection Time: 03/22/24  8:50 AM   Specimen: Urine, Catheterized  Result Value Ref Range Status   Specimen Description   Final    URINE, CATHETERIZED Performed at [facility], [address], [place], [place] [address]    Special Requests   Final    NONE Performed at [facility], [address] [place], [place] [address]    Culture >=100,000 COLONIES/mL ESCHERICHIA COLI (A)  Final   Report Status 03/24/2024 FINAL  Final   Organism ID, Bacteria ESCHERICHIA COLI (A)  Final      Susceptibility   Escherichia coli - MIC*    AMPICILLIN >=32 RESISTANT  Resistant     CEFAZOLIN  RESISTANT Resistant     CEFEPIME  <=0.12 SENSITIVE Sensitive     CEFTRIAXONE  <=0.25 SENSITIVE Sensitive     CIPROFLOXACIN  >=4 RESISTANT Resistant     GENTAMICIN <=1 SENSITIVE Sensitive     IMIPENEM <=0.25 SENSITIVE Sensitive     NITROFURANTOIN  <=16 SENSITIVE Sensitive     TRIMETH /SULFA  >=320 RESISTANT Resistant     AMPICILLIN/SULBACTAM >=32 RESISTANT Resistant     PIP/TAZO 16 SENSITIVE Sensitive ug/mL    * >=100,000 COLONIES/mL ESCHERICHIA COLI  Blood culture (routine x 2)     Status: Abnormal   Collection Time: 03/22/24  9:38 AM   Specimen: BLOOD  Result Value Ref Range Status   Specimen  Description   Final    BLOOD LEFT ANTECUBITAL Performed at [facility], [address] [place], [place] [address]    Special Requests   Final    BOTTLES DRAWN AEROBIC AND ANAEROBIC Blood Culture results may not be optimal due to an inadequate volume of blood received in culture bottles Performed at [facility], [address] [place], [place] [address]    Culture  Setup Time   Final    GRAM POSITIVE COCCI AEROBIC BOTTLE ONLY CRITICAL RESULT CALLED TO, READ BACK BY AND VERIFIED WITH: JASON ROBBINS 03/23/24 0445 MW    Culture (A)  Final    STAPHYLOCOCCUS WARNERI THE SIGNIFICANCE OF ISOLATING THIS ORGANISM FROM A SINGLE SET OF BLOOD CULTURES WHEN MULTIPLE SETS ARE DRAWN IS UNCERTAIN. PLEASE NOTIFY THE MICROBIOLOGY DEPARTMENT WITHIN ONE WEEK IF SPECIATION AND SENSITIVITIES ARE REQUIRED. Performed at [facility], [address] [place], [place] [address]    Report Status 03/25/2024 FINAL  Final  Blood culture (routine x 2)     Status: None   Collection Time: 03/22/24  9:38 AM   Specimen: BLOOD  Result Value Ref Range Status   Specimen Description BLOOD BLOOD LEFT ARM  Final   Special Requests   Final    BOTTLES DRAWN AEROBIC AND ANAEROBIC Blood Culture adequate volume   Culture   Final    NO GROWTH 5 DAYS Performed at [facility],  [address] [place], [place] [address]    Report Status 03/27/2024 FINAL  Final  Blood Culture ID Panel (Reflexed)     Status: Abnormal   Collection Time: 03/22/24  9:38 AM  Result Value Ref Range Status   Enterococcus faecalis NOT DETECTED NOT DETECTED Final   Enterococcus Faecium NOT DETECTED NOT DETECTED Final   Listeria monocytogenes NOT DETECTED NOT DETECTED Final   Staphylococcus species DETECTED (A) NOT DETECTED Final    Comment: CRITICAL RESULT CALLED TO, READ BACK BY AND VERIFIED WITH: JASON ROBBINS 03/23/24 0445 MW    Staphylococcus aureus (BCID) NOT DETECTED NOT DETECTED Final   Staphylococcus epidermidis NOT DETECTED NOT DETECTED Final   Staphylococcus lugdunensis NOT DETECTED NOT DETECTED Final   Streptococcus species NOT DETECTED NOT DETECTED Final   Streptococcus agalactiae NOT DETECTED NOT DETECTED Final   Streptococcus pneumoniae NOT DETECTED NOT DETECTED Final   Streptococcus pyogenes NOT DETECTED NOT DETECTED Final   A.calcoaceticus-baumannii NOT DETECTED NOT DETECTED Final   Bacteroides fragilis NOT DETECTED NOT DETECTED Final   Enterobacterales NOT DETECTED NOT DETECTED Final   Enterobacter cloacae complex NOT DETECTED NOT DETECTED Final   Escherichia coli NOT DETECTED NOT DETECTED Final   Klebsiella aerogenes NOT DETECTED NOT DETECTED Final   Klebsiella oxytoca NOT DETECTED NOT DETECTED Final   Klebsiella pneumoniae NOT DETECTED NOT DETECTED Final   Proteus species NOT DETECTED NOT DETECTED Final   Salmonella species NOT DETECTED NOT DETECTED Final   Serratia marcescens NOT DETECTED NOT DETECTED Final   Haemophilus influenzae NOT DETECTED NOT DETECTED Final   Neisseria meningitidis NOT DETECTED NOT DETECTED Final   Pseudomonas aeruginosa NOT DETECTED NOT DETECTED Final   Stenotrophomonas maltophilia NOT DETECTED NOT DETECTED Final   Candida albicans NOT DETECTED NOT DETECTED Final   Candida auris NOT DETECTED NOT DETECTED Final   Candida glabrata NOT  DETECTED NOT DETECTED Final   Candida krusei NOT DETECTED NOT DETECTED Final   Candida parapsilosis NOT DETECTED NOT DETECTED Final   Candida tropicalis NOT DETECTED NOT DETECTED Final  Cryptococcus neoformans/gattii NOT DETECTED NOT DETECTED Final    Comment: Performed at [facility], [address] [place], [place] [address]  MRSA Next Gen by PCR, Nasal     Status: None   Collection Time: 03/23/24  9:20 AM   Specimen: Nasal Mucosa; Nasal Swab  Result Value Ref Range Status   MRSA by PCR Next Gen NOT DETECTED NOT DETECTED Final    Comment: (NOTE) The GeneXpert MRSA Assay (FDA approved for NASAL specimens only), is one component of a comprehensive MRSA colonization surveillance program. It is not intended to diagnose MRSA infection nor to guide or monitor treatment for MRSA infections. Test performance is not FDA approved in patients less than 4 years old. Performed at [facility], [address] [place], [place] [address]   Culture, blood (single) w Reflex to ID Panel     Status: None (Preliminary result)   Collection Time: 03/23/24  4:45 PM   Specimen: BLOOD  Result Value Ref Range Status   Specimen Description BLOOD BLOOD RIGHT HAND  Final   Special Requests   Final    BOTTLES DRAWN AEROBIC AND ANAEROBIC Blood Culture adequate volume   Culture   Final    NO GROWTH 4 DAYS Performed at [facility], [address] [place], [place] [address]    Report Status PENDING  Incomplete  Culture, blood (Routine X 2) w Reflex to ID Panel     Status: None (Preliminary result)   Collection Time: 03/25/24 12:49 PM   Specimen: BLOOD  Result Value Ref Range Status   Specimen Description BLOOD BLOOD LEFT HAND  Final   Special Requests   Final    BOTTLES DRAWN AEROBIC AND ANAEROBIC Blood Culture adequate volume   Culture   Final    NO GROWTH 2 DAYS Performed at [facility], [address] [place], [place] [address]    Report Status PENDING   Incomplete  Culture, blood (Routine X 2) w Reflex to ID Panel     Status: None (Preliminary result)   Collection Time: 03/25/24 12:50 PM   Specimen: BLOOD  Result Value Ref Range Status   Specimen Description BLOOD BLOOD RIGHT HAND  Final   Special Requests   Final    BOTTLES DRAWN AEROBIC AND ANAEROBIC Blood Culture results may not be optimal due to an inadequate volume of blood received in culture bottles   Culture   Final    NO GROWTH 2 DAYS Performed at [facility], [address] [place], [place] [address]    Report Status PENDING  Incomplete         Radiology Studies: DG Abd 2 Views Result Date: 03/27/2024 CLINICAL DATA:  Ileus. EXAM: ABDOMEN - 2 VIEW COMPARISON:  March 26, 2024. FINDINGS: Distal tip of nasogastric tube appears to be looped within proximal stomach. Postsurgical changes are seen involving lower thoracic and lumbar spine. Right ureteral stent is again noted. Stable diffuse colonic dilatation is noted suggesting ileus. IMPRESSION: Stable diffuse colonic dilatation is noted suggesting ileus. Electronically Signed   By: Lynwood Landy Raddle M.D.   On: 03/27/2024 09:18   DG Abd Portable 1V Result Date: 03/26/2024 CLINICAL DATA:  [id number] Nasogastric tube present [id number] EXAM: PORTABLE ABDOMEN - 1 VIEW COMPARISON:  None Available. FINDINGS: Partially visualized tube-likely an enteric tube with tip overlying the expected region of the gastric lumen. Side port collimated off view. Persistent gaseous distension of the bowel, likely colonic. No radio-opaque calculi or other significant  radiographic abnormality are seen. Thoracolumbar sacral iliac surgical artery. IMPRESSION: 1. Partially visualized tube-likely an enteric tube with tip overlying the expected region of the gastric lumen. Side port collimated off view. Recommend chest x-ray for further evaluation. 2. Persistent gaseous distension of the bowel, likely colonic. Electronically Signed   By: Morgane  Naveau M.D.    On: 03/26/2024 20:02   DG Abd 2 Views Result Date: 03/26/2024 CLINICAL DATA:  Abdominal distension EXAM: ABDOMEN - 2 VIEW COMPARISON:  CT 03/22/2024. FINDINGS: Upright supine views. The upright view includes the lower chest and upper abdomen. No free intraperitoneal air. Moderate right hemidiaphragm elevation. Thoracolumbar spine fixation. Supine views demonstrate gaseous distension of bowel, primarily felt to represent colon. Compared to the scout view of the 03/22/2024 CT, mildly progressive. Right ureteric stent. IMPRESSION: Diffuse gaseous distension of bowel, primarily felt to be colonic. Mildly progressive since 03/22/2024 CT. Favor colonic ileus or partial obstruction from fecal impaction, when correlated with CT. Electronically Signed   By: Rockey Kilts M.D.   On: 03/26/2024 14:06        Scheduled Meds:  brimonidine   1 drop Both Eyes BID   Chlorhexidine  Gluconate Cloth  6 each Topical Daily   cloNIDine   0.1 mg Oral Daily   diltiazem   300 mg Oral Daily   enoxaparin  (LOVENOX ) injection  30 mg Subcutaneous Q24H   hydrALAZINE   50 mg Oral Q8H   insulin  aspart  0-9 Units Subcutaneous TID WC   metoCLOPramide   5 mg Oral BID   metoprolol  tartrate  50 mg Oral BID   pantoprazole   40 mg Oral Daily   polyethylene glycol  17 g Oral Daily   rosuvastatin   10 mg Oral Daily   sodium bicarbonate   650 mg Oral BID   sodium chloride  flush  3 mL Intravenous Q12H   sodium phosphate   1 enema Rectal BID   torsemide   10 mg Oral Daily   Continuous Infusions:  sodium chloride  50 mL/hr at 03/26/24 2243   cefTRIAXone  (ROCEPHIN )  IV 2 g (03/26/24 2237)     LOS: 5 days       Anthony CHRISTELLA Pouch, MD [facility] Pager [phone]  If 7PM-7AM, please contact night-coverage [email or web address] 03/27/2024, 10:19 AM

## 2024-03-27 NOTE — Progress Notes (Signed)
 Abdominal xray and CT completed. Per MD Piscoya pull back 6cm connect back to suction low intermittent 60-80.

## 2024-03-27 NOTE — Progress Notes (Signed)
 Physical Therapy Treatment Patient Details Name: Stephanie [name] MRN: [id number] DOB: [birth year] Today's Date: 03/27/2024   History of Present Illness Stephanie [name] is an 83 y.o F who comes to [facility] on 03/22/24 for acute onset weakness. Pt admitted with UTI. anemia, asthma, chronic kidney disease, type 2 diabetes mellitus, atrial fibrillation, GERD, glaucoma, hypertension, dyslipidemia, and vitamin B-12 deficiency, right hydronephrosis and right ureteral stent. At baseline pt is a household AMB with RW, requires intermittent minA for bed mobility and low surface transfers from husband.    PT Comments  Pt was long sitting in bed, asleep, upon arrival. She easily awakes and is agreeable to session with encouragement. Supportive spouse present throughout session and very encouraging. PT BP at rest 139/48(74) seated EOB BP 133/51, after standing and taking steps to recliner BP 141/55(79). No symptoms of dizziness throughout session. Remains fully alert but is slightly self limiting. Pt was able to tolerate getting OOB towards L, standing 2 x EOB to RW with bed height elevated, and then taking ~ 3-5 steps to pivot to recliner. Pt did not want to stay in recliner but with encouragement, agreeable to sit up for ~ 30 minutes. Pt remains far from her baseline abilities. Recommendations remain appropriate to maximize pt's safety and independence with all ADLs.     If plan is discharge home, recommend the following: A lot of help with walking and/or transfers;Assistance with cooking/housework;Help with stairs or ramp for entrance;Assist for transportation;Direct supervision/assist for financial management;Direct supervision/assist for medications management;Supervision due to cognitive status;A lot of help with bathing/dressing/bathroom     Equipment Recommendations  Other (comment) (Defer to next level of care)       Precautions / Restrictions Precautions Precautions: Fall Recall of Precautions/Restrictions:  Intact Restrictions Weight Bearing Restrictions Per Provider Order: No     Mobility  Bed Mobility Overal bed mobility: Needs Assistance Bed Mobility: Supine to Sit, Sit to Supine  Supine to sit: HOB elevated, Mod assist  General bed mobility comments: increased time required to perform. tc/vcs throughout    Transfers Overall transfer level: Needs assistance Equipment used: Rolling walker (2 wheels) Transfers: Sit to/from Stand Sit to Stand: Min assist, Mod assist, From elevated surface  General transfer comment: pt stood EOB 2 x from elevated bed height. BP in standing 141/55(79). no symptoms of dizziness however only able to tolerate static standinfg for ~ 10-20 sec. She did stand pivot to recliner with mod assist. improved progression of BLEs in second standing attempt. Pt unwilling to attempt further ambulation due to fatigue. requested to only sit in recliner for ~ 30 minutes    Ambulation/Gait Ambulation/Gait assistance: Min assist Gait Distance (Feet): 3 Feet Assistive device: Rolling walker (2 wheels) Gait Pattern/deviations: Step-to pattern Gait velocity: decreased  General Gait Details: pt did take several steps to turn to recliner. poor foot clearance + poor overall activity tolerance    Balance Overall balance assessment: Needs assistance Sitting-balance support: Feet supported, No upper extremity supported, Single extremity supported Sitting balance-Leahy Scale: Fair Sitting balance - Comments: Does have occasional posterior LOB in sitting once fatigued   Standing balance support: During functional activity, Reliant on assistive device for balance, Bilateral upper extremity supported Standing balance-Leahy Scale: Fair Standing balance comment: reliant on RW in all standing. poor standing tolerance. remains high fall risk       Communication Communication Communication: No apparent difficulties  Cognition Arousal: Alert Behavior During Therapy: Flat affect    PT - Cognitive impairments: Difficult to assess Difficult  to assess due to: Level of arousal    PT - Cognition Comments: Pt is alert and oriented x 3 bvut has severely slow processing and requires increased time for all responses. Agreeable to session with max encouragement form author and supportive spouse. Following commands: Intact      Cueing Cueing Techniques: Verbal cues, Tactile cues         Pertinent Vitals/Pain Pain Assessment Pain Assessment: PAINAD Breathing: normal Negative Vocalization: occasional moan/groan, low speech, negative/disapproving quality Facial Expression: sad, frightened, frown Body Language: relaxed Consolability: no need to console PAINAD Score: 2 Pain Location: Abdomen, per visual assessment appears distended Pain Descriptors / Indicators: Constant, Cramping, Grimacing, Moaning Pain Intervention(s): Limited activity within patient's tolerance, Monitored during session, Premedicated before session, Repositioned     PT Goals (current goals can now be found in the care plan section) Acute Rehab PT Goals Patient Stated Goal: regain baseline strength for return to home Progress towards PT goals: Progressing toward goals    Frequency    Min 2X/week       AM-PAC PT 6 Clicks Mobility   Outcome Measure  Help needed turning from your back to your side while in a flat bed without using bedrails?: A Lot Help needed moving from lying on your back to sitting on the side of a flat bed without using bedrails?: A Lot Help needed moving to and from a bed to a chair (including a wheelchair)?: A Lot Help needed standing up from a chair using your arms (e.g., wheelchair or bedside chair)?: A Lot Help needed to walk in hospital room?: A Lot Help needed climbing 3-5 steps with a railing? : Total 6 Click Score: 11    End of Session   Activity Tolerance: Patient limited by fatigue;Other (comment) (limited by poor activity tolerance + self  limiting) Patient left: in chair;with call bell/phone within reach;with chair alarm set;with family/visitor present Nurse Communication: Mobility status PT Visit Diagnosis: Difficulty in walking, not elsewhere classified (R26.2);Other abnormalities of gait and mobility (R26.89);Muscle weakness (generalized) (M62.81)     Time: 9095-9082 PT Time Calculation (min) (ACUTE ONLY): 13 min  Charges:    $Therapeutic Activity: 8-22 mins PT General Charges $$ ACUTE PT VISIT: 1 Visit                    Rankin Essex PTA 03/27/24, 9:37 AM

## 2024-03-27 NOTE — Progress Notes (Signed)
 NGT attempt of left nare unsucessful tubing wouldn't track down past an inch of the nare. NGT placed of right nare 3 attempts. 1st 2 attempts tubing within throat and coming out of mouth. 3rd attempt successful tip at 68. Pt tolerated well reports feels much better than previous 2 attempts.

## 2024-03-27 NOTE — Plan of Care (Signed)
  Problem: Education: Goal: Ability to describe self-care measures that may prevent or decrease complications (Diabetes Survival Skills Education) will improve Outcome: Progressing   Problem: Tissue Perfusion: Goal: Adequacy of tissue perfusion will improve Outcome: Progressing   Problem: Clinical Measurements: Goal: Diagnostic test results will improve Outcome: Progressing Goal: Signs and symptoms of infection will decrease Outcome: Progressing   Problem: Elimination: Goal: Will not experience complications related to urinary retention Outcome: Progressing

## 2024-03-27 NOTE — Progress Notes (Signed)
 INFECTIOUS DISEASE PROGRESS NOTE Date of Admission:  03/22/2024     ID: Stephanie [name] is a 83 y.o. female with UTI, bacteremia Principal Problem:   Sepsis secondary to UTI [name]) Active Problems:   DM type 2 with diabetic mixed hyperlipidemia (HCC)   Rheumatoid arthritis involving multiple sites with positive rheumatoid factor (HCC)   Pure hypercholesterolemia   CKD (chronic kidney disease) stage 5, GFR less than 15 ml/min (HCC)   UPJ obstruction, acquired   Type II diabetes mellitus with renal manifestations (HCC)   Anemia due to stage 4 chronic kidney disease (HCC)   Subjective: Seen by surgery and NGT placed. No fevers, wbc remains 13  ROS  Eleven systems are reviewed and negative except per hpi  Medications:  Antibiotics Given (last 72 hours)     Date/Time Action Medication Dose Rate   03/24/24 2119 New Bag/Given   cefTRIAXone  (ROCEPHIN ) 2 g in sodium chloride  0.9 % 100 mL IVPB 2 g 200 mL/hr   03/25/24 2125 New Bag/Given   cefTRIAXone  (ROCEPHIN ) 2 g in sodium chloride  0.9 % 100 mL IVPB 2 g 200 mL/hr   03/26/24 2237 New Bag/Given   cefTRIAXone  (ROCEPHIN ) 2 g in sodium chloride  0.9 % 100 mL IVPB 2 g 200 mL/hr       brimonidine   1 drop Both Eyes BID   Chlorhexidine  Gluconate Cloth  6 each Topical Daily   cloNIDine   0.1 mg Oral Daily   diltiazem   300 mg Oral Daily   enoxaparin  (LOVENOX ) injection  30 mg Subcutaneous Q24H   hydrALAZINE   50 mg Oral Q8H   insulin  aspart  0-9 Units Subcutaneous TID WC   metoCLOPramide   5 mg Oral BID   metoprolol  tartrate  50 mg Oral BID   pantoprazole   40 mg Oral Daily   polyethylene glycol  17 g Oral Daily   rosuvastatin   10 mg Oral Daily   sodium bicarbonate   650 mg Oral BID   sodium chloride  flush  3 mL Intravenous Q12H   sodium phosphate   1 enema Rectal BID   torsemide   10 mg Oral Daily    Objective: Vital signs in last 24 hours: Temp:  [98 F (36.7 C)-98.3 F (36.8 C)] 98.1 F (36.7 C) (06/27 0812) Pulse Rate:  [60-71] 60  (06/27 0812) Resp:  [15-18] 18 (06/27 0812) BP: (131-178)/(46-70) 145/46 (06/27 0812) SpO2:  [90 %-94 %] 90 % (06/27 0812) GEN sleeping in bed RRR CTAB Abd mod distention, tympanic. Foley in place  Lab Results Recent Labs    03/25/24 0428 03/27/24 0439  WBC 13.4* 13.4*  HGB 9.0* 9.0*  HCT 29.6* 29.3*  NA 133* 135  K 3.7 3.2*  CL 107 102  CO2 18* 21*  BUN 46* 61*  CREATININE 3.03* 3.91*    Microbiology: Results for orders placed or performed during the hospital encounter of 03/22/24  Resp panel by RT-PCR (RSV, Flu A&B, Covid) Anterior Nasal Swab     Status: None   Collection Time: 03/22/24  8:50 AM   Specimen: Anterior Nasal Swab  Result Value Ref Range Status   SARS Coronavirus 2 by RT PCR NEGATIVE NEGATIVE Final    Comment: (NOTE) SARS-CoV-2 target nucleic acids are NOT DETECTED.  The SARS-CoV-2 RNA is generally detectable in upper respiratory specimens during the acute phase of infection. The lowest concentration of SARS-CoV-2 viral copies this assay can detect is 138 copies/mL. A negative result does not preclude SARS-Cov-2 infection and should not be used as the sole  basis for treatment or other patient management decisions. A negative result may occur with  improper specimen collection/handling, submission of specimen other than nasopharyngeal swab, presence of viral mutation(s) within the areas targeted by this assay, and inadequate number of viral copies(<138 copies/mL). A negative result must be combined with clinical observations, patient history, and epidemiological information. The expected result is Negative.  Fact Sheet for Patients:  BloggerCourse.com  Fact Sheet for Healthcare Providers:  SeriousBroker.it  This test is no t yet approved or cleared by the [place]  FDA and  has been authorized for detection and/or diagnosis of SARS-CoV-2 by FDA under an Emergency Use Authorization (EUA). This  EUA will remain  in effect (meaning this test can be used) for the duration of the COVID-19 declaration under Section 564(b)(1) of the Act, 21 U.S.C.section 360bbb-3(b)(1), unless the authorization is terminated  or revoked sooner.       Influenza A by PCR NEGATIVE NEGATIVE Final   Influenza B by PCR NEGATIVE NEGATIVE Final    Comment: (NOTE) The Xpert Xpress SARS-CoV-2/FLU/RSV plus assay is intended as an aid in the diagnosis of influenza from Nasopharyngeal swab specimens and should not be used as a sole basis for treatment. Nasal washings and aspirates are unacceptable for Xpert Xpress SARS-CoV-2/FLU/RSV testing.  Fact Sheet for Patients: BloggerCourse.com  Fact Sheet for Healthcare Providers: SeriousBroker.it  This test is not yet approved or cleared by the [place]  FDA and has been authorized for detection and/or diagnosis of SARS-CoV-2 by FDA under an Emergency Use Authorization (EUA). This EUA will remain in effect (meaning this test can be used) for the duration of the COVID-19 declaration under Section 564(b)(1) of the Act, 21 U.S.C. section 360bbb-3(b)(1), unless the authorization is terminated or revoked.     Resp Syncytial Virus by PCR NEGATIVE NEGATIVE Final    Comment: (NOTE) Fact Sheet for Patients: BloggerCourse.com  Fact Sheet for Healthcare Providers: SeriousBroker.it  This test is not yet approved or cleared by the [place]  FDA and has been authorized for detection and/or diagnosis of SARS-CoV-2 by FDA under an Emergency Use Authorization (EUA). This EUA will remain in effect (meaning this test can be used) for the duration of the COVID-19 declaration under Section 564(b)(1) of the Act, 21 U.S.C. section 360bbb-3(b)(1), unless the authorization is terminated or revoked.  Performed at [facility], [address] [place], [place]  [address]   Urine Culture (for pregnant, neutropenic or urologic patients or patients with an indwelling urinary catheter)     Status: Abnormal   Collection Time: 03/22/24  8:50 AM   Specimen: Urine, Catheterized  Result Value Ref Range Status   Specimen Description   Final    URINE, CATHETERIZED Performed at [facility], [address] [place], [place] [address]    Special Requests   Final    NONE Performed at [facility] [address] [place], [place] [address]    Culture >=100,000 COLONIES/mL ESCHERICHIA COLI (A)  Final   Report Status 03/24/2024 FINAL  Final   Organism ID, Bacteria ESCHERICHIA COLI (A)  Final      Susceptibility   Escherichia coli - MIC*    AMPICILLIN >=32 RESISTANT Resistant     CEFAZOLIN  RESISTANT Resistant     CEFEPIME  <=0.12 SENSITIVE Sensitive     CEFTRIAXONE  <=0.25 SENSITIVE Sensitive     CIPROFLOXACIN  >=4 RESISTANT Resistant     GENTAMICIN <=1 SENSITIVE Sensitive     IMIPENEM <=0.25 SENSITIVE Sensitive  NITROFURANTOIN  <=16 SENSITIVE Sensitive     TRIMETH /SULFA  >=320 RESISTANT Resistant     AMPICILLIN/SULBACTAM >=32 RESISTANT Resistant     PIP/TAZO 16 SENSITIVE Sensitive ug/mL    * >=100,000 COLONIES/mL ESCHERICHIA COLI  Blood culture (routine x 2)     Status: Abnormal   Collection Time: 03/22/24  9:38 AM   Specimen: BLOOD  Result Value Ref Range Status   Specimen Description   Final    BLOOD LEFT ANTECUBITAL Performed at [facility], [address] [place], [place] [address]    Special Requests   Final    BOTTLES DRAWN AEROBIC AND ANAEROBIC Blood Culture results may not be optimal due to an inadequate volume of blood received in culture bottles Performed at [facility], [address] [place], [place] [address]    Culture  Setup Time   Final    GRAM POSITIVE COCCI AEROBIC BOTTLE ONLY CRITICAL RESULT CALLED TO, READ BACK BY AND VERIFIED WITH: JASON ROBBINS 03/23/24 0445 MW    Culture (A)  Final     STAPHYLOCOCCUS WARNERI THE SIGNIFICANCE OF ISOLATING THIS ORGANISM FROM A SINGLE SET OF BLOOD CULTURES WHEN MULTIPLE SETS ARE DRAWN IS UNCERTAIN. PLEASE NOTIFY THE MICROBIOLOGY DEPARTMENT WITHIN ONE WEEK IF SPECIATION AND SENSITIVITIES ARE REQUIRED. Performed at [facility], [address] [place], [place] [address]    Report Status 03/25/2024 FINAL  Final  Blood culture (routine x 2)     Status: None   Collection Time: 03/22/24  9:38 AM   Specimen: BLOOD  Result Value Ref Range Status   Specimen Description BLOOD BLOOD LEFT ARM  Final   Special Requests   Final    BOTTLES DRAWN AEROBIC AND ANAEROBIC Blood Culture adequate volume   Culture   Final    NO GROWTH 5 DAYS Performed at [facility], [address] [place], [place] [address]    Report Status 03/27/2024 FINAL  Final  Blood Culture ID Panel (Reflexed)     Status: Abnormal   Collection Time: 03/22/24  9:38 AM  Result Value Ref Range Status   Enterococcus faecalis NOT DETECTED NOT DETECTED Final   Enterococcus Faecium NOT DETECTED NOT DETECTED Final   Listeria monocytogenes NOT DETECTED NOT DETECTED Final   Staphylococcus species DETECTED (A) NOT DETECTED Final    Comment: CRITICAL RESULT CALLED TO, READ BACK BY AND VERIFIED WITH: JASON ROBBINS 03/23/24 0445 MW    Staphylococcus aureus (BCID) NOT DETECTED NOT DETECTED Final   Staphylococcus epidermidis NOT DETECTED NOT DETECTED Final   Staphylococcus lugdunensis NOT DETECTED NOT DETECTED Final   Streptococcus species NOT DETECTED NOT DETECTED Final   Streptococcus agalactiae NOT DETECTED NOT DETECTED Final   Streptococcus pneumoniae NOT DETECTED NOT DETECTED Final   Streptococcus pyogenes NOT DETECTED NOT DETECTED Final   A.calcoaceticus-baumannii NOT DETECTED NOT DETECTED Final   Bacteroides fragilis NOT DETECTED NOT DETECTED Final   Enterobacterales NOT DETECTED NOT DETECTED Final   Enterobacter cloacae complex NOT DETECTED NOT DETECTED Final    Escherichia coli NOT DETECTED NOT DETECTED Final   Klebsiella aerogenes NOT DETECTED NOT DETECTED Final   Klebsiella oxytoca NOT DETECTED NOT DETECTED Final   Klebsiella pneumoniae NOT DETECTED NOT DETECTED Final   Proteus species NOT DETECTED NOT DETECTED Final   Salmonella species NOT DETECTED NOT DETECTED Final   Serratia marcescens NOT DETECTED NOT DETECTED Final   Haemophilus influenzae NOT DETECTED NOT DETECTED Final   Neisseria meningitidis NOT DETECTED NOT DETECTED Final   Pseudomonas  aeruginosa NOT DETECTED NOT DETECTED Final   Stenotrophomonas maltophilia NOT DETECTED NOT DETECTED Final   Candida albicans NOT DETECTED NOT DETECTED Final   Candida auris NOT DETECTED NOT DETECTED Final   Candida glabrata NOT DETECTED NOT DETECTED Final   Candida krusei NOT DETECTED NOT DETECTED Final   Candida parapsilosis NOT DETECTED NOT DETECTED Final   Candida tropicalis NOT DETECTED NOT DETECTED Final   Cryptococcus neoformans/gattii NOT DETECTED NOT DETECTED Final    Comment: Performed at [facility], [address] [place], [place] [address]  MRSA Next Gen by PCR, Nasal     Status: None   Collection Time: 03/23/24  9:20 AM   Specimen: Nasal Mucosa; Nasal Swab  Result Value Ref Range Status   MRSA by PCR Next Gen NOT DETECTED NOT DETECTED Final    Comment: (NOTE) The GeneXpert MRSA Assay (FDA approved for NASAL specimens only), is one component of a comprehensive MRSA colonization surveillance program. It is not intended to diagnose MRSA infection nor to guide or monitor treatment for MRSA infections. Test performance is not FDA approved in patients less than 17 years old. Performed at [facility], [address] [place], [place] [address]   Culture, blood (single) w Reflex to ID Panel     Status: None (Preliminary result)   Collection Time: 03/23/24  4:45 PM   Specimen: BLOOD  Result Value Ref Range Status   Specimen Description BLOOD BLOOD RIGHT HAND  Final    Special Requests   Final    BOTTLES DRAWN AEROBIC AND ANAEROBIC Blood Culture adequate volume   Culture   Final    NO GROWTH 4 DAYS Performed at [facility], [address] [place], [place] [address]    Report Status PENDING  Incomplete  Culture, blood (Routine X 2) w Reflex to ID Panel     Status: None (Preliminary result)   Collection Time: 03/25/24 12:49 PM   Specimen: BLOOD  Result Value Ref Range Status   Specimen Description BLOOD BLOOD LEFT HAND  Final   Special Requests   Final    BOTTLES DRAWN AEROBIC AND ANAEROBIC Blood Culture adequate volume   Culture   Final    NO GROWTH 2 DAYS Performed at [facility], [address], [place], [place] [address]    Report Status PENDING  Incomplete  Culture, blood (Routine X 2) w Reflex to ID Panel     Status: None (Preliminary result)   Collection Time: 03/25/24 12:50 PM   Specimen: BLOOD  Result Value Ref Range Status   Specimen Description BLOOD BLOOD RIGHT HAND  Final   Special Requests   Final    BOTTLES DRAWN AEROBIC AND ANAEROBIC Blood Culture results may not be optimal due to an inadequate volume of blood received in culture bottles   Culture   Final    NO GROWTH 2 DAYS Performed at [facility], [address] [place], [place] [address]    Report Status PENDING  Incomplete    Studies/Results: DG Abd 2 Views Result Date: 03/27/2024 CLINICAL DATA:  Ileus. EXAM: ABDOMEN - 2 VIEW COMPARISON:  March 26, 2024. FINDINGS: Distal tip of nasogastric tube appears to be looped within proximal stomach. Postsurgical changes are seen involving lower thoracic and lumbar spine. Right ureteral stent is again noted. Stable diffuse colonic dilatation is noted suggesting ileus. IMPRESSION: Stable diffuse colonic dilatation is noted suggesting ileus. Electronically Signed   By: Lynwood Landy Raddle M.D.   On:  03/27/2024 09:18   DG Abd Portable 1V Result Date: 03/26/2024 CLINICAL DATA:  [id number] Nasogastric tube  present [id number] EXAM: PORTABLE ABDOMEN - 1 VIEW COMPARISON:  None Available. FINDINGS: Partially visualized tube-likely an enteric tube with tip overlying the expected region of the gastric lumen. Side port collimated off view. Persistent gaseous distension of the bowel, likely colonic. No radio-opaque calculi or other significant radiographic abnormality are seen. Thoracolumbar sacral iliac surgical artery. IMPRESSION: 1. Partially visualized tube-likely an enteric tube with tip overlying the expected region of the gastric lumen. Side port collimated off view. Recommend chest x-ray for further evaluation. 2. Persistent gaseous distension of the bowel, likely colonic. Electronically Signed   By: Morgane  Naveau M.D.   On: 03/26/2024 20:02   DG Abd 2 Views Result Date: 03/26/2024 CLINICAL DATA:  Abdominal distension EXAM: ABDOMEN - 2 VIEW COMPARISON:  CT 03/22/2024. FINDINGS: Upright supine views. The upright view includes the lower chest and upper abdomen. No free intraperitoneal air. Moderate right hemidiaphragm elevation. Thoracolumbar spine fixation. Supine views demonstrate gaseous distension of bowel, primarily felt to represent colon. Compared to the scout view of the 03/22/2024 CT, mildly progressive. Right ureteric stent. IMPRESSION: Diffuse gaseous distension of bowel, primarily felt to be colonic. Mildly progressive since 03/22/2024 CT. Favor colonic ileus or partial obstruction from fecal impaction, when correlated with CT. Electronically Signed   By: Rockey Kilts M.D.   On: 03/26/2024 14:06    Assessment/Plan: RAVEN FURNAS is a 83 y.o. 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  In the ED she was found to have no fever but a white count of 18.  Lactic acid was elevated at 2.  Creatinine was around her baseline 3.  Urine sample showed moderate leukocyte esterase greater than 50 whites urine cultures  pending. She had a CT scan done that showed stent in place on the right side nephroureteral stent but no signs of high-grade obstructive uropathy.  There was desiccated stool large volume and distended rectum.  Chest x-ray was normal.  Lower extremity Doppler was negative. Since then blood cultures have returned positive with 1 of 2 growing gram-positive cocci identified as staph species not Staph aureus. UCX > 100 K GNR She also has progressive decline per her husband over last few weeks. Has progressive anemia.      6/24- Ucx with E coli, relatively sensitive. BCX ID pending 6/25- cx with S warneri - likely contaminant FU bcx 6/23 negaitve  Recommendations UTI - in pt with chronic foley and nephrouteral stent chronically. She has complicated history and has had stent in place and chronic foley for several years. Per her husband was on chronic suppressive abx several years ago with Dr Ike but since having foley cath placed has only had a few UTIs and not on recent abx Cont ctx  Will likely need 10 days treatment for complicated UTI given stent and foley in place and high risk of recurrences.  Discussed with husband Messaged urology re considering changing stent once we sterilize the urine vs trying to treat her through and only change stent if recurs. Likley given other issues can monitor and treat through this Can place picc line since bcx 6/23 neg  Bacteremia- 1/2 sets staph warneri -  I suspect contaminant. No pacemaker or lines. Repeat bcx NGTD .    Possible impaction- noted on CT = husband says has BMs every few days.  Rectal exam done by  me 6/25 had no stool in rectal vault  Progressive decline at home for last several weeks   Progressive anemia - unclear etiology.    Thank you very much for the consult. Will follow with you.  Stephanie [name]   03/27/2024, 1:07 PM

## 2024-03-27 NOTE — Progress Notes (Signed)
 [facility] SURGICAL PROGRESS NOTE (cpt [phone])  Hospital Day(s): 5.   Interval History: Patient seen and examined, no acute events or new complaints overnight. Patient reports she is overall feeling better. Remains distended. Mild lower abdominal pain. No fever, chills, nausea. Leukocytosis remains stable; 13.4K. Hgb to 9.0 which is stable as well. Renal function worse this AM: sCr - 3.91; UO - 500 ccs. Hypokalemia to 3.2. Hypermagnesemia to 2.7. NGT in place; output 1100 ccs recorded. KUB this morning still with dilated loops of bowel. She does continue to have bowel function recorded.   Review of Systems:  Constitutional: denies fever, chills  HEENT: denies cough or congestion  Respiratory: denies any shortness of breath  Cardiovascular: denies chest pain or palpitations  Gastrointestinal: + distension, + abdominal pain (lower abdomen, mild), denied nausea Genitourinary: denies burning with urination or urinary frequency Musculoskeletal: denies pain, decreased motor or sensation  Vital signs in last 24 hours: [min-max] current  Temp:  [98 F (36.7 C)-98.3 F (36.8 C)] 98 F (36.7 C) (06/27 0516) Pulse Rate:  [60-71] 61 (06/27 0518) Resp:  [15-18] 15 (06/27 0516) BP: (131-178)/(55-70) 131/55 (06/27 0518) SpO2:  [93 %-96 %] 93 % (06/27 0516)     Height: 5' 6 (167.6 cm) Weight: 69.4 kg BMI (Calculated): 24.71   Intake/Output last 2 shifts:  06/26 0701 - 06/27 0700 In: 650 [NG/GT:650] Out: 1600 [Urine:500; Emesis/NG output:1100]   Physical Exam:  Constitutional: alert, cooperative and no distress  HENT: normocephalic without obvious abnormality; NGT in place; output bilious  Eyes: PERRL, EOM's grossly intact and symmetric  Respiratory: breathing non-labored at rest  Cardiovascular: regular rate and sinus rhythm  Gastrointestinal: soft, she has lower abdominal soreness, distended, no rebound/guarding. She is certainly not peritonitic Musculoskeletal: no edema or  wounds, motor and sensation grossly intact, NT    Labs:     Latest Ref Rng & Units 03/27/2024    4:39 AM 03/25/2024    4:28 AM 03/24/2024    4:09 AM  CBC  WBC 4.0 - 10.5 K/uL 13.4  13.4  12.6   Hemoglobin 12.0 - 15.0 g/dL 9.0  9.0  9.8   Hematocrit 36.0 - 46.0 % 29.3  29.6  32.4   Platelets 150 - 400 K/uL 291  225  203       Latest Ref Rng & Units 03/27/2024    4:39 AM 03/25/2024    4:28 AM 03/24/2024    4:09 AM  CMP  Glucose 70 - 99 mg/dL 854  853  875   BUN 8 - 23 mg/dL 61  46  46   Creatinine 0.44 - 1.00 mg/dL 6.08  6.96  7.12   Sodium 135 - 145 mmol/L 135  133  136   Potassium 3.5 - 5.1 mmol/L 3.2  3.7  3.9   Chloride 98 - 111 mmol/L 102  107  109   CO2 22 - 32 mmol/L 21  18  19    Calcium  8.9 - 10.3 mg/dL 8.0  8.2  8.3   Total Protein 6.5 - 8.1 g/dL   5.5   Total Bilirubin 0.0 - 1.2 mg/dL   0.8   Alkaline Phos 38 - 126 U/L   86   AST 15 - 41 U/L   18   ALT 0 - 44 U/L   33      Imaging studies:   KUB (03/27/2024) personally reviewed still with dilated bowel (question colonic), no free air, and radiologist report reviewed pending.[email or web address]  Assessment/Plan: 83 y.o. female admitted with UTI now with abdominal distension, nausea, emesis concerning for ileus (? Colonic) vs pSBO vs large bowel obstruction secondary to stool impaction               - Continue NGT decompression; LIS: monitor and record output  - I do not think she warrants any emergent surgical intervention. Her, and her husband, understand that if she worsens or fails to improve, we will need to consider this.   - Continue bowel regimen from below given stool burden on presenting imaging; enema BID  - Replete electrolytes; monitor   - Monitor renal function; worsening this AM, likely secondary to GI losses - NPO + IVF Support - Monitor abdominal examination; on-going bowel function - Serial KUB as needed; ordered for AM - Pain control prn; avoid narcotics if feasible   - Antiemetics prn              - Mobilize  as tolerated              - Further management per primary service; we will follow   All of the above findings and recommendations were discussed with the patient, patient's family (husband at bedside), and the medical team, and all of their questions were answered to their expressed satisfaction.  -- Arthea Platt, PA-C [facility] 03/27/2024, 7:22 AM M-F: 7am - 4pm

## 2024-03-27 NOTE — Progress Notes (Signed)
 Unable to aspirate or flush NGT. MD Trudy and Piscoya notified. Order received to replace NG tube. Upon removal some resistance noted. Tubing noted to be kinked upon removal. Pt tolerated. Small amount of bleeding from right nare s/p. poc to allow bleeding to stop and provide medication for sore throat and will replace.

## 2024-03-28 ENCOUNTER — Inpatient Hospital Stay

## 2024-03-28 DIAGNOSIS — K56609 Unspecified intestinal obstruction, unspecified as to partial versus complete obstruction: Secondary | ICD-10-CM | POA: Diagnosis not present

## 2024-03-28 DIAGNOSIS — N390 Urinary tract infection, site not specified: Secondary | ICD-10-CM | POA: Diagnosis not present

## 2024-03-28 DIAGNOSIS — A419 Sepsis, unspecified organism: Secondary | ICD-10-CM | POA: Diagnosis not present

## 2024-03-28 LAB — CBC
HCT: 29.5 % — ABNORMAL LOW (ref 36.0–46.0)
Hemoglobin: 9 g/dL — ABNORMAL LOW (ref 12.0–15.0)
MCH: 28.8 pg (ref 26.0–34.0)
MCHC: 30.5 g/dL (ref 30.0–36.0)
MCV: 94.6 fL (ref 80.0–100.0)
Platelets: 328 10*3/uL (ref 150–400)
RBC: 3.12 MIL/uL — ABNORMAL LOW (ref 3.87–5.11)
RDW: 15.1 % (ref 11.5–15.5)
WBC: 12.8 10*3/uL — ABNORMAL HIGH (ref 4.0–10.5)
nRBC: 0 % (ref 0.0–0.2)

## 2024-03-28 LAB — BASIC METABOLIC PANEL WITH GFR
Anion gap: 13 (ref 5–15)
BUN: 67 mg/dL — ABNORMAL HIGH (ref 8–23)
CO2: 16 mmol/L — ABNORMAL LOW (ref 22–32)
Calcium: 7.7 mg/dL — ABNORMAL LOW (ref 8.9–10.3)
Chloride: 105 mmol/L (ref 98–111)
Creatinine, Ser: 4 mg/dL — ABNORMAL HIGH (ref 0.44–1.00)
GFR, Estimated: 11 mL/min — ABNORMAL LOW (ref >60)
Glucose, Bld: 114 mg/dL — ABNORMAL HIGH (ref 70–99)
Potassium: 3 mmol/L — ABNORMAL LOW (ref 3.5–5.1)
Sodium: 134 mmol/L — ABNORMAL LOW (ref 135–145)

## 2024-03-28 LAB — MAGNESIUM: Magnesium: 2.7 mg/dL — ABNORMAL HIGH (ref 1.7–2.4)

## 2024-03-28 LAB — GLUCOSE, CAPILLARY
Glucose-Capillary: 110 mg/dL — ABNORMAL HIGH (ref 70–99)
Glucose-Capillary: 113 mg/dL — ABNORMAL HIGH (ref 70–99)
Glucose-Capillary: 119 mg/dL — ABNORMAL HIGH (ref 70–99)
Glucose-Capillary: 129 mg/dL — ABNORMAL HIGH (ref 70–99)

## 2024-03-28 LAB — CULTURE, BLOOD (SINGLE)
Culture: NO GROWTH
Special Requests: ADEQUATE

## 2024-03-28 MED ORDER — POTASSIUM CHLORIDE CRYS ER 20 MEQ PO TBCR
40.0000 meq | EXTENDED_RELEASE_TABLET | Freq: Once | ORAL | Status: AC
Start: 2024-03-28 — End: 2024-03-28
  Administered 2024-03-28: 40 meq via ORAL
  Filled 2024-03-28: qty 2

## 2024-03-28 MED ORDER — DIATRIZOATE MEGLUMINE & SODIUM 66-10 % PO SOLN
90.0000 mL | Freq: Once | ORAL | Status: AC
  Administered 2024-03-28: 90 mL via NASOGASTRIC

## 2024-03-28 NOTE — Progress Notes (Signed)
 PROGRESS NOTE    Stephanie [name]  FMW:[id number] DOB: [birth year] DOA: 03/22/2024 PCP: Cleotilde Oneil FALCON, MD   Assessment & Plan:   Principal Problem:   Sepsis secondary to UTI [facility]) Active Problems:   DM type 2 with diabetic mixed hyperlipidemia (HCC)   Rheumatoid arthritis involving multiple sites with positive rheumatoid factor (HCC)   Pure hypercholesterolemia   CKD (chronic kidney disease) stage 5, GFR less than 15 ml/min (HCC)   UPJ obstruction, acquired   Type II diabetes mellitus with renal manifestations (HCC)   Anemia due to stage 4 chronic kidney disease (HCC)  Assessment and Plan: Sepsis due to UTI: urine cx growing e. coli. Foley exchanged in the ER. Likely secondary to chronic foley. Continue on IV rocephin  x 10 days as per ID. ID following and recs apprec    Staph bacteremia: staph warneri, etiology unclear. Likely containment. Repeat blood cxs NGTD   H/o urinary retention and hydronephrosis: foley exchanged in the ER. Hydronephrosis appears to be improved from prior & s/p stent. May need to have a stent replaced vs having stent replaced if another infection occurs   Partial SBO: as per XR. NPO. Continue w/ NG tube as per gen surg. Will give gastrografin  today as per gen surg. 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 If Cr rises again tomorrow, will consult nephro. Holding home torsemide    Hypokalemia: potassium given. Mg is WNL   ACD: likely secondary to CKD.  Will transfuse if Hb < 7.0   DM2: fair control, HbA1c 7.2. Continue on SSI w/ accuchecks    Metabolic acidosis: continue on sodium bicarb. Likely secondary to CKD    HLD: continue on statin    HTN: continue on home dose of metoprolol , diltiazem , hydralazine  & clonidine  (started this admission). Will need to check orthostatic vital signs as significant concern for orthostatic hypotension after a syncopal event when working with therapy 03/25/24.    RA: holding home dose of leflunomide  w/ active infection    COPD: w/o exacerbation. Continue on bronchodilators    Glaucoma: continue on home dose of brimonidine          DVT prophylaxis: lovenox   Code Status: full  Family Communication: discussed pt's care w/ pt's family at bedside and answered their questions  Disposition Plan: likely d/c to SNF  Level of care: Telemetry Medical  Status is: Inpatient Remains inpatient appropriate because: severity of illness, has a SBO. Needs SNF placement     Consultants:  ID Gen surg   Procedures:  Antimicrobials: rocephin     Subjective: Pt c/o fatigue   Objective: Vitals:   03/27/24 1344 03/27/24 2037 03/28/24 0411 03/28/24 0834  BP: (!) 131/57 (!) 158/51 (!) 145/44 (!) 144/45  Pulse: (!) 52 60 62 68  Resp: 18 17  16   Temp: 97.9 F (36.6 C) 98.8 F (37.1 C) 98.5 F (36.9 C) 97.6 F (36.4 C)  TempSrc:  Oral Oral   SpO2: 96% 96% 97% 94%  Weight:      Height:        Intake/Output Summary (Last 24 hours) at 03/28/2024 0853 Last data filed at 03/27/2024 2323 Gross per 24 hour  Intake 1319.42 ml  Output 450 ml  Net 869.42 ml   Filed Weights   03/22/24 1000 03/22/24 1414  Weight: 69.4 kg 69.4 kg    Examination:  General exam: appears uncomfortable  Respiratory system: clear breath sounds b/l  Cardiovascular system: S1/S2+. No rubs or clicks  Gastrointestinal system: abd is soft, NT, distended. Hypoactive bowel sounds  Central nervous system: alert & awake.   Psychiatry: judgement and insight appears at baseline. Flat mood and affect    Data Reviewed: I have personally reviewed following labs and imaging studies  CBC: Recent Labs  Lab 03/22/24 0850 03/23/24 0329 03/24/24 0409 03/25/24 0428 03/27/24 0439 03/28/24 0403  WBC 18.3* 11.1* 12.6* 13.4* 13.4* 12.8*  NEUTROABS 14.4*  --   --   --   --   --   HGB 9.3* 8.1* 9.8* 9.0* 9.0* 9.0*  HCT 30.8* 27.2* 32.4* 29.6* 29.3* 29.5*  MCV 97.2 96.1 96.1  95.5 93.3 94.6  PLT 208 166 203 225 291 328   Basic Metabolic Panel: Recent Labs  Lab 03/23/24 0329 03/24/24 0409 03/25/24 0428 03/27/24 0439 03/28/24 0403  NA 134* 136 133* 135 134*  K 4.7 3.9 3.7 3.2* 3.0*  CL 108 109 107 102 105  CO2 20* 19* 18* 21* 16*  GLUCOSE 100* 124* 146* 145* 114*  BUN 49* 46* 46* 61* 67*  CREATININE 2.86* 2.87* 3.03* 3.91* 4.00*  CALCIUM  8.5* 8.3* 8.2* 8.0* 7.7*  MG  --   --   --  2.7* 2.7*   GFR: Estimated Creatinine Clearance: 10.3 mL/min (A) (by C-G formula based on SCr of 4 mg/dL (H)). Liver Function Tests: Recent Labs  Lab 03/22/24 0850 03/24/24 0409  AST 127* 18  ALT 87* 33  ALKPHOS 108 86  BILITOT 0.8 0.8  PROT 5.5* 5.5*  ALBUMIN  3.1* 2.7*   Recent Labs  Lab 03/22/24 0850  LIPASE 28   No results for input(s): AMMONIA in the last 168 hours. Coagulation Profile: No results for input(s): INR, PROTIME in the last 168 hours. Cardiac Enzymes: No results for input(s): CKTOTAL, CKMB, CKMBINDEX, TROPONINI in the last 168 hours. BNP (last 3 results) No results for input(s): PROBNP in the last 8760 hours. HbA1C: No results for input(s): HGBA1C in the last 72 hours. CBG: Recent Labs  Lab 03/26/24 2039 03/27/24 0813 03/27/24 1823 03/27/24 2028 03/28/24 0836  GLUCAP 145* 156* 109* 97 110*   Lipid Profile: No results for input(s): CHOL, HDL, LDLCALC, TRIG, CHOLHDL, LDLDIRECT in the last 72 hours. Thyroid Function Tests: No results for input(s): TSH, T4TOTAL, FREET4, T3FREE, THYROIDAB in the last 72 hours. Anemia Panel: No results for input(s): VITAMINB12, FOLATE, FERRITIN, TIBC, IRON , RETICCTPCT in the last 72 hours. Sepsis Labs: Recent Labs  Lab 03/22/24 9061 03/22/24 1051  LATICACIDVEN 2.0* 1.4    Recent Results (from the past 240 hours)  Resp panel by RT-PCR (RSV, Flu A&B, Covid) Anterior Nasal Swab     Status: None   Collection Time: 03/22/24  8:50 AM   Specimen:  Anterior Nasal Swab  Result Value Ref Range Status   SARS Coronavirus 2 by RT PCR NEGATIVE NEGATIVE Final    Comment: (NOTE) SARS-CoV-2 target nucleic acids are NOT DETECTED.  The SARS-CoV-2 RNA is generally detectable in upper respiratory specimens during the acute phase of infection. The lowest concentration of SARS-CoV-2 viral copies this assay can detect is 138 copies/mL. A negative result does not preclude SARS-Cov-2 infection and should not be used as the sole basis for treatment or other patient management decisions. A negative result may occur with  improper specimen collection/handling, submission of specimen other than nasopharyngeal swab, presence of viral mutation(s) within the areas targeted by this assay, and inadequate number of viral copies(<138 copies/mL). A negative  result must be combined with clinical observations, patient history, and epidemiological information. The expected result is Negative.  Fact Sheet for Patients:  BloggerCourse.com  Fact Sheet for Healthcare Providers:  SeriousBroker.it  This test is no t yet approved or cleared by the [place]  FDA and  has been authorized for detection and/or diagnosis of SARS-CoV-2 by FDA under an Emergency Use Authorization (EUA). This EUA will remain  in effect (meaning this test can be used) for the duration of the COVID-19 declaration under Section 564(b)(1) of the Act, 21 U.S.C.section 360bbb-3(b)(1), unless the authorization is terminated  or revoked sooner.       Influenza A by PCR NEGATIVE NEGATIVE Final   Influenza B by PCR NEGATIVE NEGATIVE Final    Comment: (NOTE) The Xpert Xpress SARS-CoV-2/FLU/RSV plus assay is intended as an aid in the diagnosis of influenza from Nasopharyngeal swab specimens and should not be used as a sole basis for treatment. Nasal washings and aspirates are unacceptable for Xpert Xpress SARS-CoV-2/FLU/RSV testing.  Fact  Sheet for Patients: BloggerCourse.com  Fact Sheet for Healthcare Providers: SeriousBroker.it  This test is not yet approved or cleared by the [place]  FDA and has been authorized for detection and/or diagnosis of SARS-CoV-2 by FDA under an Emergency Use Authorization (EUA). This EUA will remain in effect (meaning this test can be used) for the duration of the COVID-19 declaration under Section 564(b)(1) of the Act, 21 U.S.C. section 360bbb-3(b)(1), unless the authorization is terminated or revoked.     Resp Syncytial Virus by PCR NEGATIVE NEGATIVE Final    Comment: (NOTE) Fact Sheet for Patients: BloggerCourse.com  Fact Sheet for Healthcare Providers: SeriousBroker.it  This test is not yet approved or cleared by the [place]  FDA and has been authorized for detection and/or diagnosis of SARS-CoV-2 by FDA under an Emergency Use Authorization (EUA). This EUA will remain in effect (meaning this test can be used) for the duration of the COVID-19 declaration under Section 564(b)(1) of the Act, 21 U.S.C. section 360bbb-3(b)(1), unless the authorization is terminated or revoked.  Performed at [facility], [address] [place], [place] [address]   Urine Culture (for pregnant, neutropenic or urologic patients or patients with an indwelling urinary catheter)     Status: Abnormal   Collection Time: 03/22/24  8:50 AM   Specimen: Urine, Catheterized  Result Value Ref Range Status   Specimen Description   Final    URINE, CATHETERIZED Performed at [facility], [address] [place], [place] [address]    Special Requests   Final    NONE Performed at [facility], [address] [place], [place] [address]    Culture >=100,000 COLONIES/mL ESCHERICHIA COLI (A)  Final   Report Status 03/24/2024 FINAL  Final   Organism ID, Bacteria ESCHERICHIA  COLI (A)  Final      Susceptibility   Escherichia coli - MIC*    AMPICILLIN >=32 RESISTANT Resistant     CEFAZOLIN  RESISTANT Resistant     CEFEPIME  <=0.12 SENSITIVE Sensitive     CEFTRIAXONE  <=0.25 SENSITIVE Sensitive     CIPROFLOXACIN  >=4 RESISTANT Resistant     GENTAMICIN <=1 SENSITIVE Sensitive     IMIPENEM <=0.25 SENSITIVE Sensitive     NITROFURANTOIN  <=16 SENSITIVE Sensitive     TRIMETH /SULFA  >=320 RESISTANT Resistant     AMPICILLIN/SULBACTAM >=32 RESISTANT Resistant     PIP/TAZO 16 SENSITIVE Sensitive ug/mL    * >=100,000 COLONIES/mL ESCHERICHIA COLI  Blood culture (routine x 2)  Status: Abnormal   Collection Time: 03/22/24  9:38 AM   Specimen: BLOOD  Result Value Ref Range Status   Specimen Description   Final    BLOOD LEFT ANTECUBITAL Performed at [facility], [address] [place], [place] [address]    Special Requests   Final    BOTTLES DRAWN AEROBIC AND ANAEROBIC Blood Culture results may not be optimal due to an inadequate volume of blood received in culture bottles Performed at [facility], [address] [place], [place] [address]    Culture  Setup Time   Final    GRAM POSITIVE COCCI AEROBIC BOTTLE ONLY CRITICAL RESULT CALLED TO, READ BACK BY AND VERIFIED WITH: JASON ROBBINS 03/23/24 0445 MW    Culture (A)  Final    STAPHYLOCOCCUS WARNERI THE SIGNIFICANCE OF ISOLATING THIS ORGANISM FROM A SINGLE SET OF BLOOD CULTURES WHEN MULTIPLE SETS ARE DRAWN IS UNCERTAIN. PLEASE NOTIFY THE MICROBIOLOGY DEPARTMENT WITHIN ONE WEEK IF SPECIATION AND SENSITIVITIES ARE REQUIRED. Performed at [facility], [address] [place], [place] [address]    Report Status 03/25/2024 FINAL  Final  Blood culture (routine x 2)     Status: None   Collection Time: 03/22/24  9:38 AM   Specimen: BLOOD  Result Value Ref Range Status   Specimen Description BLOOD BLOOD LEFT ARM  Final   Special Requests   Final    BOTTLES DRAWN AEROBIC AND ANAEROBIC Blood  Culture adequate volume   Culture   Final    NO GROWTH 5 DAYS Performed at [facility], [address] [place], [place] [address]    Report Status 03/27/2024 FINAL  Final  Blood Culture ID Panel (Reflexed)     Status: Abnormal   Collection Time: 03/22/24  9:38 AM  Result Value Ref Range Status   Enterococcus faecalis NOT DETECTED NOT DETECTED Final   Enterococcus Faecium NOT DETECTED NOT DETECTED Final   Listeria monocytogenes NOT DETECTED NOT DETECTED Final   Staphylococcus species DETECTED (A) NOT DETECTED Final    Comment: CRITICAL RESULT CALLED TO, READ BACK BY AND VERIFIED WITH: JASON ROBBINS 03/23/24 0445 MW    Staphylococcus aureus (BCID) NOT DETECTED NOT DETECTED Final   Staphylococcus epidermidis NOT DETECTED NOT DETECTED Final   Staphylococcus lugdunensis NOT DETECTED NOT DETECTED Final   Streptococcus species NOT DETECTED NOT DETECTED Final   Streptococcus agalactiae NOT DETECTED NOT DETECTED Final   Streptococcus pneumoniae NOT DETECTED NOT DETECTED Final   Streptococcus pyogenes NOT DETECTED NOT DETECTED Final   A.calcoaceticus-baumannii NOT DETECTED NOT DETECTED Final   Bacteroides fragilis NOT DETECTED NOT DETECTED Final   Enterobacterales NOT DETECTED NOT DETECTED Final   Enterobacter cloacae complex NOT DETECTED NOT DETECTED Final   Escherichia coli NOT DETECTED NOT DETECTED Final   Klebsiella aerogenes NOT DETECTED NOT DETECTED Final   Klebsiella oxytoca NOT DETECTED NOT DETECTED Final   Klebsiella pneumoniae NOT DETECTED NOT DETECTED Final   Proteus species NOT DETECTED NOT DETECTED Final   Salmonella species NOT DETECTED NOT DETECTED Final   Serratia marcescens NOT DETECTED NOT DETECTED Final   Haemophilus influenzae NOT DETECTED NOT DETECTED Final   Neisseria meningitidis NOT DETECTED NOT DETECTED Final   Pseudomonas aeruginosa NOT DETECTED NOT DETECTED Final   Stenotrophomonas maltophilia NOT DETECTED NOT DETECTED Final   Candida albicans NOT  DETECTED NOT DETECTED Final   Candida auris NOT DETECTED NOT DETECTED Final   Candida glabrata NOT DETECTED NOT DETECTED Final   Candida krusei NOT  DETECTED NOT DETECTED Final   Candida parapsilosis NOT DETECTED NOT DETECTED Final   Candida tropicalis NOT DETECTED NOT DETECTED Final   Cryptococcus neoformans/gattii NOT DETECTED NOT DETECTED Final    Comment: Performed at [facility], [address] [place], [place] [address]  MRSA Next Gen by PCR, Nasal     Status: None   Collection Time: 03/23/24  9:20 AM   Specimen: Nasal Mucosa; Nasal Swab  Result Value Ref Range Status   MRSA by PCR Next Gen NOT DETECTED NOT DETECTED Final    Comment: (NOTE) The GeneXpert MRSA Assay (FDA approved for NASAL specimens only), is one component of a comprehensive MRSA colonization surveillance program. It is not intended to diagnose MRSA infection nor to guide or monitor treatment for MRSA infections. Test performance is not FDA approved in patients less than 39 years old. Performed at [facility], [address] [place], [place] [address]   Culture, blood (single) w Reflex to ID Panel     Status: None   Collection Time: 03/23/24  4:45 PM   Specimen: BLOOD  Result Value Ref Range Status   Specimen Description BLOOD BLOOD RIGHT HAND  Final   Special Requests   Final    BOTTLES DRAWN AEROBIC AND ANAEROBIC Blood Culture adequate volume   Culture   Final    NO GROWTH 5 DAYS Performed at [facility], [address] [place], [place] [address]    Report Status 03/28/2024 FINAL  Final  Culture, blood (Routine X 2) w Reflex to ID Panel     Status: None (Preliminary result)   Collection Time: 03/25/24 12:49 PM   Specimen: BLOOD  Result Value Ref Range Status   Specimen Description BLOOD BLOOD LEFT HAND  Final   Special Requests   Final    BOTTLES DRAWN AEROBIC AND ANAEROBIC Blood Culture adequate volume   Culture   Final    NO GROWTH 3 DAYS Performed at [facility], [address] [place], [place] [address]    Report Status PENDING  Incomplete  Culture, blood (Routine X 2) w Reflex to ID Panel     Status: None (Preliminary result)   Collection Time: 03/25/24 12:50 PM   Specimen: BLOOD  Result Value Ref Range Status   Specimen Description BLOOD BLOOD RIGHT HAND  Final   Special Requests   Final    BOTTLES DRAWN AEROBIC AND ANAEROBIC Blood Culture results may not be optimal due to an inadequate volume of blood received in culture bottles   Culture   Final    NO GROWTH 3 DAYS Performed at [facility], [address] [place], [place] [address]    Report Status PENDING  Incomplete         Radiology Studies: CT ABDOMEN WO CONTRAST Result Date: 03/27/2024 CLINICAL DATA:  NG tube placement with question of extraluminal position. EXAM: CT ABDOMEN WITHOUT CONTRAST TECHNIQUE: Multidetector CT imaging of the abdomen was performed following the standard protocol without IV contrast. RADIATION DOSE REDUCTION: This exam was performed according to the departmental dose-optimization program which includes automated exposure control, adjustment of the mA and/or kV according to patient size and/or use of iterative reconstruction technique. COMPARISON:  March 22, 2024 FINDINGS: Lower chest: Marked severity posterior right lower lobe consolidation is seen. Small bilateral pleural effusions are also noted, right greater than left. Hepatobiliary: No focal liver abnormality is seen. No gallstones, gallbladder wall thickening, or biliary dilatation. Pancreas: Unremarkable. No pancreatic ductal dilatation  or surrounding inflammatory changes. Spleen: Normal in size without focal abnormality. Adrenals/Urinary Tract: Adrenal glands are unremarkable. Kidneys are normal in size, without renal calculi or focal lesions. A right-sided endo ureteral stent is noted. Stomach/Bowel: Enteric tube is seen. It travels through the gastric body, gastric antrum and  duodenal bulb. Its distal tip extends approximately 8 mm into the wall of superior part of the proximal duodenum, without evidence of complete perforation. Less than 1 mm of duodenal wall is seen in between the distal tip and adjacent mesenteric fat. Marked severity mural thickening of the duodenal bulb and proximal duodenum is seen. The appendix is not identified. Dilated air-filled loops of bowel are seen within the anterior aspect of the mid and upper abdomen. Vascular/Lymphatic: Aortic atherosclerosis. No enlarged abdominal lymph nodes. Other: No abdominal wall hernia or abnormality. Musculoskeletal: Multilevel postoperative and degenerative changes seen throughout the visualized portion of the lumbar spine. IMPRESSION: 1. Enteric tube positioning, as described above, which extends into the wall of the proximal duodenum without evidence of complete perforation. 2. Marked severity mural thickening of the duodenal bulb and proximal duodenum, which may represent sequelae associated with duodenitis. 3. Dilated air-filled loops of bowel within the anterior aspect of the mid and upper abdomen, which may represent ileus. 4. Marked severity posterior right lower lobe consolidation. 5. Small bilateral pleural effusions, right greater than left. 6. Right-sided endo ureteral stent. 7. Aortic atherosclerosis. Electronically Signed   By: Suzen Dials M.D.   On: 03/27/2024 19:21   DG Abd 1 View Result Date: 03/27/2024 CLINICAL DATA:  [id number] Nasogastric tube present [id number] EXAM: ABDOMEN - 1 VIEW COMPARISON:  March 27, 2024 7:13 a.m. FINDINGS: Esophagogastric tube courses below the diaphragm with the distal tip overlying the right upper quadrant. Gaseous distension of a few segments of small bowel and colon in the upper abdomen. No pneumoperitoneum. Thoracolumbar fusion hardware partially visualized. The lung bases are clear. IMPRESSION: Esophagogastric tube courses below the diaphragm with the distal tip overlying the  right upper quadrant. While this could be within the gastric antrum or proximal duodenum, this seems further lateral than would be expected and a repeat KUB with enteric contrast is recommended to confirm tube intraluminal positioning. These results will be called to the ordering clinician or representative by the Radiologist Assistant and communication documented in the PACS or [employer]. Electronically Signed   By: Rogelia Myers M.D.   On: 03/27/2024 17:38   DG Abd 2 Views Result Date: 03/27/2024 CLINICAL DATA:  Ileus. EXAM: ABDOMEN - 2 VIEW COMPARISON:  March 26, 2024. FINDINGS: Distal tip of nasogastric tube appears to be looped within proximal stomach. Postsurgical changes are seen involving lower thoracic and lumbar spine. Right ureteral stent is again noted. Stable diffuse colonic dilatation is noted suggesting ileus. IMPRESSION: Stable diffuse colonic dilatation is noted suggesting ileus. Electronically Signed   By: Lynwood Landy Raddle M.D.   On: 03/27/2024 09:18   DG Abd Portable 1V Result Date: 03/26/2024 CLINICAL DATA:  [id number] Nasogastric tube present [id number] EXAM: PORTABLE ABDOMEN - 1 VIEW COMPARISON:  None Available. FINDINGS: Partially visualized tube-likely an enteric tube with tip overlying the expected region of the gastric lumen. Side port collimated off view. Persistent gaseous distension of the bowel, likely colonic. No radio-opaque calculi or other significant radiographic abnormality are seen. Thoracolumbar sacral iliac surgical artery. IMPRESSION: 1. Partially visualized tube-likely an enteric tube with tip overlying the expected region of the gastric lumen. Side port collimated off view. Recommend chest x-ray for  further evaluation. 2. Persistent gaseous distension of the bowel, likely colonic. Electronically Signed   By: Morgane  Naveau M.D.   On: 03/26/2024 20:02   DG Abd 2 Views Result Date: 03/26/2024 CLINICAL DATA:  Abdominal distension EXAM: ABDOMEN - 2 VIEW COMPARISON:   CT 03/22/2024. FINDINGS: Upright supine views. The upright view includes the lower chest and upper abdomen. No free intraperitoneal air. Moderate right hemidiaphragm elevation. Thoracolumbar spine fixation. Supine views demonstrate gaseous distension of bowel, primarily felt to represent colon. Compared to the scout view of the 03/22/2024 CT, mildly progressive. Right ureteric stent. IMPRESSION: Diffuse gaseous distension of bowel, primarily felt to be colonic. Mildly progressive since 03/22/2024 CT. Favor colonic ileus or partial obstruction from fecal impaction, when correlated with CT. Electronically Signed   By: Rockey Kilts M.D.   On: 03/26/2024 14:06        Scheduled Meds:  brimonidine   1 drop Both Eyes BID   Chlorhexidine  Gluconate Cloth  6 each Topical Daily   cloNIDine   0.1 mg Oral Daily   diltiazem   300 mg Oral Daily   enoxaparin  (LOVENOX ) injection  30 mg Subcutaneous Q24H   hydrALAZINE   50 mg Oral Q8H   insulin  aspart  0-9 Units Subcutaneous TID WC   metoCLOPramide   5 mg Oral BID   metoprolol  tartrate  50 mg Oral BID   pantoprazole   40 mg Oral Daily   polyethylene glycol  17 g Oral Daily   potassium chloride   40 mEq Oral Once   rosuvastatin   10 mg Oral Daily   sodium bicarbonate   650 mg Oral BID   sodium chloride  flush  3 mL Intravenous Q12H   sodium phosphate   1 enema Rectal BID   Continuous Infusions:  sodium chloride  50 mL/hr at 03/27/24 2149   cefTRIAXone  (ROCEPHIN )  IV 2 g (03/27/24 2152)     LOS: 6 days       Anthony CHRISTELLA Pouch, MD [facility] Pager [phone]  If 7PM-7AM, please contact night-coverage [email or web address] 03/28/2024, 8:53 AM

## 2024-03-28 NOTE — Progress Notes (Signed)
 Occupational Therapy Treatment Patient Details Name: Stephanie [name] MRN: [id number] DOB: Sep 02, 1941 Today's Date: 03/28/2024   History of present illness Stephanie [name] is an 83 y.o F who comes to [facility] on 03/22/24 for acute onset weakness. Pt admitted with UTI. anemia, asthma, chronic kidney disease, type 2 diabetes mellitus, atrial fibrillation, GERD, glaucoma, hypertension, dyslipidemia, and vitamin B-12 deficiency, right hydronephrosis and right ureteral stent. At baseline pt is a household AMB with RW, requires intermittent minA for bed mobility and low surface transfers from husband.   OT comments  Stephanie [name] was seen for OT treatment on this date. Upon arrival to room pt supine in bed, agreeable to OT Tx session. OT facilitated ADL management with education and assistance as described below. See ADL section for additional details regarding occupational performance. Pt continues to be functionally limited by generalized weakness, decreased activity tolerance, and increased pain with mobility. Pt/caregiver return verbalize understanding of education provided t/o session. Pt is progressing toward OT goals and continues to benefit from skilled OT services to maximize return to PLOF and minimize risk of future falls, injury, caregiver burden, and readmission. Will continue to follow POC as written. Discharge recommendation remains appropriate.        If plan is discharge home, recommend the following:  A little help with walking and/or transfers;A lot of help with bathing/dressing/bathroom;Assistance with cooking/housework;Assist for transportation;Direct supervision/assist for medications management;Help with stairs or ramp for entrance   Equipment Recommendations  Other (comment) (defer to next venue of care)    Recommendations for Other Services      Precautions / Restrictions Precautions Precautions: Fall Recall of Precautions/Restrictions: Intact Restrictions Weight Bearing Restrictions Per  Provider Order: No       Mobility Bed Mobility Overal bed mobility: Needs Assistance Bed Mobility: Sit to Supine, Rolling, Sidelying to Sit Rolling: Mod assist Sidelying to sit: Mod assist Supine to sit: Mod assist Sit to supine: Mod assist   General bed mobility comments: increased time required to perform. tc/vcs throughout    Transfers Overall transfer level: Needs assistance Equipment used: Rolling walker (2 wheels) Transfers: Sit to/from Stand Sit to Stand: Mod assist           General transfer comment: MOD A to STS From EOB, pt declines further mobility this date 2/2 fear of falling.     Balance Overall balance assessment: Needs assistance Sitting-balance support: Feet supported, No upper extremity supported, Single extremity supported Sitting balance-Leahy Scale: Fair Sitting balance - Comments: Does have occasional posterior lean while seated EOB improves with cuieng/MIN A Postural control: Posterior lean Standing balance support: During functional activity, Reliant on assistive device for balance, Bilateral upper extremity supported Standing balance-Leahy Scale: Fair Standing balance comment: reliant on RW in all standing. poor standing tolerance. remains high fall risk                           ADL either performed or assessed with clinical judgement   ADL Overall ADL's : Needs assistance/impaired     Grooming: Oral care;Wash/dry face;Sitting;Set up;Supervision/safety;Minimal assistance Grooming Details (indicate cue type and reason): Intermittent MIN A for upright sitting position while performing grooming tasks. Pt/caregiver educated on Estate manager/land agent and importance of regular positional changes to minimize cervical/thoracic flexion and hunched posture.                             Functional mobility during ADLs: Moderate assistance;Rolling  walker (2 wheels) General ADL Comments: Continues to require MAX A for LB ADL mangement. Very  fearful of falling this date, attempts STS x2 and is able to come to full upright position, however does not attempt to move feet/shift weight despite cueing and education on proper transfer technique. MOD A For bed mobility SUP<>SIT.    Extremity/Trunk Assessment              Vision Patient Visual Report: No change from baseline     Perception     Praxis     Communication Communication Communication: No apparent difficulties Factors Affecting Communication: Hearing impaired   Cognition Arousal: Alert Behavior During Therapy: Flat affect Cognition: Difficult to assess Difficult to assess due to: Hard of hearing/deaf           OT - Cognition Comments: Pt alert, questionable awareness of situation, difficulty with problem solving, slow processing, also HOH. Discussed importance of pt having access to her hearing aids with spouse so she can better participate in her care.                 Following commands: Intact Following commands impaired: Follows one step commands with increased time      Cueing   Cueing Techniques: Verbal cues, Tactile cues  Exercises Other Exercises Other Exercises: Pt/caregiver educated on safety, falls prevention, body mechanics, and compensatory ADL management during functional activities as described above.    Shoulder Instructions       General Comments      Pertinent Vitals/ Pain       Pain Assessment Pain Assessment: PAINAD Breathing: normal Negative Vocalization: occasional moan/groan, low speech, negative/disapproving quality Facial Expression: sad, frightened, frown Body Language: relaxed Consolability: no need to console PAINAD Score: 2 Pain Location: Abdominal pain with mobility attempts Pain Descriptors / Indicators: Grimacing, Sore Pain Intervention(s): Limited activity within patient's tolerance, Monitored during session, Repositioned, Utilized relaxation techniques  Home Living                                           Prior Functioning/Environment              Frequency  Min 2X/week        Progress Toward Goals  OT Goals(current goals can now be found in the care plan section)        Plan      Co-evaluation                 AM-PAC OT 6 Clicks Daily Activity     Outcome Measure   Help from another person eating meals?: Total (Currently NPO on NGT with suction) Help from another person taking care of personal grooming?: A Little Help from another person toileting, which includes using toliet, bedpan, or urinal?: A Lot Help from another person bathing (including washing, rinsing, drying)?: A Lot Help from another person to put on and taking off regular upper body clothing?: A Lot Help from another person to put on and taking off regular lower body clothing?: A Lot 6 Click Score: 12    End of Session Equipment Utilized During Treatment: Gait belt;Rolling walker (2 wheels)  OT Visit Diagnosis: Other abnormalities of gait and mobility (R26.89);Muscle weakness (generalized) (M62.81)   Activity Tolerance Patient tolerated treatment well   Patient Left in bed;with call bell/phone within reach;with bed alarm set;with family/visitor present   Nurse  Communication          Time: 1224-1300 OT Time Calculation (min): 36 min  Charges: OT General Charges $OT Visit: 1 Visit OT Treatments $Self Care/Home Management : 23-37 mins   Jhonny Pelton, M.S., OTR/L 03/28/24, 2:06 PM

## 2024-03-28 NOTE — Progress Notes (Signed)
 Patient ID: Stephanie [name], female   DOB: 06/13/1941, 83 y.o.   MRN: [id number]     SURGICAL PROGRESS NOTE   Hospital Day(s): 6.   Interval History: Patient seen and examined, no acute events or new complaints overnight. Patient reports feeling better today.  She endorses that the abdomen feels a little bit better.  She has been having bowel movement with enemas.  Denies any pain radiation.  No specific alleviating or aggravating factors.  Denies any nausea or vomiting.  Patient still with NGT in place.  Vital signs in last 24 hours: [min-max] current  Temp:  [97.6 F (36.4 C)-98.8 F (37.1 C)] 97.6 F (36.4 C) (06/28 0834) Pulse Rate:  [52-68] 68 (06/28 0834) Resp:  [16-18] 16 (06/28 0834) BP: (131-158)/(44-57) 144/45 (06/28 0834) SpO2:  [94 %-97 %] 94 % (06/28 0834)     Height: 5' 6 (167.6 cm) Weight: 69.4 kg BMI (Calculated): 24.71   Physical Exam:  Constitutional: alert, cooperative and no distress  Respiratory: breathing non-labored at rest  Cardiovascular: regular rate and sinus rhythm  Gastrointestinal: soft, non-tender, and mild distended and tympanic  Labs:     Latest Ref Rng & Units 03/28/2024    4:03 AM 03/27/2024    4:39 AM 03/25/2024    4:28 AM  CBC  WBC 4.0 - 10.5 K/uL 12.8  13.4  13.4   Hemoglobin 12.0 - 15.0 g/dL 9.0  9.0  9.0   Hematocrit 36.0 - 46.0 % 29.5  29.3  29.6   Platelets 150 - 400 K/uL 328  291  225       Latest Ref Rng & Units 03/28/2024    4:03 AM 03/27/2024    4:39 AM 03/25/2024    4:28 AM  CMP  Glucose 70 - 99 mg/dL 885  854  853   BUN 8 - 23 mg/dL 67  61  46   Creatinine 0.44 - 1.00 mg/dL 5.99  6.08  6.96   Sodium 135 - 145 mmol/L 134  135  133   Potassium 3.5 - 5.1 mmol/L 3.0  3.2  3.7   Chloride 98 - 111 mmol/L 105  102  107   CO2 22 - 32 mmol/L 16  21  18    Calcium  8.9 - 10.3 mg/dL 7.7  8.0  8.2     Imaging studies: No new pertinent imaging studies.  I personally evaluated abdominal x-ray this morning.  There is improvement of colonic  dilation.  Still with mild intestinal dilation.  Assessment/Plan:  83 y.o. female with colonic ileus versus partial small bowel obstruction, complicated by pertinent comorbidities including Diabetes, CKD.  - Colonic dilation seems to be improved on abdominal x-ray this morning - Patient had a bowel movement with enema - No NGT output recorded - Will give Gastrografin  challenge to rule out small bowel versus large bowel obstruction - If improved we will try clamping NGT and assess for toleration - Will continue to follow closely.  Lucas Petrin, MD

## 2024-03-29 ENCOUNTER — Inpatient Hospital Stay

## 2024-03-29 DIAGNOSIS — A419 Sepsis, unspecified organism: Secondary | ICD-10-CM | POA: Diagnosis not present

## 2024-03-29 DIAGNOSIS — K56609 Unspecified intestinal obstruction, unspecified as to partial versus complete obstruction: Secondary | ICD-10-CM | POA: Diagnosis not present

## 2024-03-29 DIAGNOSIS — N390 Urinary tract infection, site not specified: Secondary | ICD-10-CM | POA: Diagnosis not present

## 2024-03-29 LAB — GLUCOSE, CAPILLARY
Glucose-Capillary: 143 mg/dL — ABNORMAL HIGH (ref 70–99)
Glucose-Capillary: 167 mg/dL — ABNORMAL HIGH (ref 70–99)
Glucose-Capillary: 287 mg/dL — ABNORMAL HIGH (ref 70–99)

## 2024-03-29 LAB — BASIC METABOLIC PANEL WITH GFR
Anion gap: 16 — ABNORMAL HIGH (ref 5–15)
BUN: 75 mg/dL — ABNORMAL HIGH (ref 8–23)
CO2: 15 mmol/L — ABNORMAL LOW (ref 22–32)
Calcium: 8 mg/dL — ABNORMAL LOW (ref 8.9–10.3)
Chloride: 107 mmol/L (ref 98–111)
Creatinine, Ser: 4.67 mg/dL — ABNORMAL HIGH (ref 0.44–1.00)
GFR, Estimated: 9 mL/min — ABNORMAL LOW (ref >60)
Glucose, Bld: 121 mg/dL — ABNORMAL HIGH (ref 70–99)
Potassium: 3 mmol/L — ABNORMAL LOW (ref 3.5–5.1)
Sodium: 138 mmol/L (ref 135–145)

## 2024-03-29 LAB — CBC
HCT: 29.9 % — ABNORMAL LOW (ref 36.0–46.0)
Hemoglobin: 9.1 g/dL — ABNORMAL LOW (ref 12.0–15.0)
MCH: 29.3 pg (ref 26.0–34.0)
MCHC: 30.4 g/dL (ref 30.0–36.0)
MCV: 96.1 fL (ref 80.0–100.0)
Platelets: 428 10*3/uL — ABNORMAL HIGH (ref 150–400)
RBC: 3.11 MIL/uL — ABNORMAL LOW (ref 3.87–5.11)
RDW: 15.4 % (ref 11.5–15.5)
WBC: 14 10*3/uL — ABNORMAL HIGH (ref 4.0–10.5)
nRBC: 0 % (ref 0.0–0.2)

## 2024-03-29 MED ORDER — SODIUM BICARBONATE 650 MG PO TABS
1300.0000 mg | ORAL_TABLET | Freq: Two times a day (BID) | ORAL | Status: DC
  Administered 2024-03-29 – 2024-04-01 (×7): 1300 mg via ORAL
  Filled 2024-03-29 (×8): qty 2

## 2024-03-29 MED ORDER — POTASSIUM CHLORIDE CRYS ER 20 MEQ PO TBCR
40.0000 meq | EXTENDED_RELEASE_TABLET | Freq: Two times a day (BID) | ORAL | Status: DC
  Administered 2024-03-29: 40 meq via ORAL
  Filled 2024-03-29: qty 2

## 2024-03-29 MED ORDER — SODIUM CHLORIDE 0.9 % IV SOLN: INTRAVENOUS | Status: DC

## 2024-03-29 NOTE — Progress Notes (Signed)
 Patient ID: Stephanie [name], female   DOB: October 04, 1940, 83 y.o.   MRN: [id number]     SURGICAL PROGRESS NOTE   Hospital Day(s): 7.   Interval History: Patient seen and examined, no acute events or new complaints overnight. Patient reports feeling better today.  She endorses that she had multiple bowel movements.  This is also because she is having enema.  She denies any nausea.  She denies any developing.  No evaluation.  No alleviating or aggravating factors.  Vital signs in last 24 hours: [min-max] current  Temp:  [97.4 F (36.3 C)-97.6 F (36.4 C)] 97.6 F (36.4 C) (06/28 2031) Pulse Rate:  [59-73] (P) 73 (06/29 0513) Resp:  [18] 18 (06/28 2031) BP: (135-140)/(46-62) (P) 142/72 (06/29 0513) SpO2:  [95 %-96 %] 95 % (06/28 2031)     Height: 5' 6 (167.6 cm) Weight: 69.4 kg BMI (Calculated): 24.71   Physical Exam:  Constitutional: alert, cooperative and no distress  Respiratory: breathing non-labored at rest  Cardiovascular: regular rate and sinus rhythm  Gastrointestinal: soft, non-tender, and non-distended  Labs:     Latest Ref Rng & Units 03/28/2024    4:03 AM 03/27/2024    4:39 AM 03/25/2024    4:28 AM  CBC  WBC 4.0 - 10.5 K/uL 12.8  13.4  13.4   Hemoglobin 12.0 - 15.0 g/dL 9.0  9.0  9.0   Hematocrit 36.0 - 46.0 % 29.5  29.3  29.6   Platelets 150 - 400 K/uL 328  291  225       Latest Ref Rng & Units 03/28/2024    4:03 AM 03/27/2024    4:39 AM 03/25/2024    4:28 AM  CMP  Glucose 70 - 99 mg/dL 885  854  853   BUN 8 - 23 mg/dL 67  61  46   Creatinine 0.44 - 1.00 mg/dL 5.99  6.08  6.96   Sodium 135 - 145 mmol/L 134  135  133   Potassium 3.5 - 5.1 mmol/L 3.0  3.2  3.7   Chloride 98 - 111 mmol/L 105  102  107   CO2 22 - 32 mmol/L 16  21  18    Calcium  8.9 - 10.3 mg/dL 7.7  8.0  8.2     Imaging studies: Abdominal x-ray yesterday shows improvement of colonic dilation.  There is contrast in the ascending and proximal descending colon.  No small bowel dilation.  Assessment/Plan:   83 y.o. female with colonic ileus versus partial small bowel obstruction, complicated by pertinent comorbidities including Diabetes, CKD.   - Clinically and by imaging the dilation continue to improve with soft abdomen. - Multiple bowel movements this is due to resolving SBO versus ileus and enemas - Will clamp NGT today to see if she tolerated liquids.  If she get nauseated NGT can be placed to suction again.  If she tolerates we consider removing the nasogastric tube later - Encouraged the patient to mobilize - Continue to keep electrolytes within normal range - Continue treatment for UTI with antibiotic as per primary team  Lucas Petrin, MD

## 2024-03-29 NOTE — Progress Notes (Signed)
 PROGRESS NOTE    Stephanie [name]  FMW:[id number] DOB: 1941/03/25 DOA: 03/22/2024 PCP: Cleotilde Oneil FALCON, MD   Assessment & Plan:   Principal Problem:   Sepsis secondary to UTI [facility]) Active Problems:   DM type 2 with diabetic mixed hyperlipidemia (HCC)   Rheumatoid arthritis involving multiple sites with positive rheumatoid factor (HCC)   Pure hypercholesterolemia   CKD (chronic kidney disease) stage 5, GFR less than 15 ml/min (HCC)   UPJ obstruction, acquired   Type II diabetes mellitus with renal manifestations (HCC)   Anemia due to stage 4 chronic kidney disease (HCC)  Assessment and Plan: Sepsis due to UTI: urine cx growing e. coli. Foley exchanged in the ER. Likely secondary to chronic foley. Continue on IV rocephin  x 10 days as per ID. ID following and recs apprec    Staph bacteremia: staph warneri, etiology unclear. Likely containment. Repeat blood cxs NGTD   H/o urinary retention and hydronephrosis: foley exchanged in the ER. S/p stent. Mild right hydronephrosis as per renal US . May need to have a stent replaced vs having stent replaced if another infection occurs   Partial SBO: as per XR.  NG tube clamped and will likely be taken out today as gen surg. Started on CLD. 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 Started on IVFs. Curbsided nephro and will re-evaluate in AM   Hypokalemia: Kdur given. Will continue to monitor   ACD: likely secondary to CKD.  No need for a transfusion currently    DM2: fair control, HbA1c 7.2. Continue on SSI w/ accuchecks    Metabolic acidosis: increase sodium bicarb dose. Likely secondary to CKD    HLD: continue on statin    HTN: continue on home dose of metoprolol , diltiazem , hydralazine  & clonidine  (started this admission). Will need to check orthostatic vital signs as significant concern for orthostatic hypotension after a syncopal event when working with therapy 03/25/24.   RA:  holding home dose of leflunomide  w/ active infection    COPD: w/o exacerbation. Continue on bronchodilators   Glaucoma: continue on home dose of brimonidine          DVT prophylaxis: lovenox   Code Status: full  Family Communication: discussed pt's care w/ pt's family at bedside and answered their questions  Disposition Plan: likely d/c to SNF  Level of care: Telemetry Medical  Status is: Inpatient Remains inpatient appropriate because: severity of illness. Needs SNF placement     Consultants:  ID Gen surg   Procedures:  Antimicrobials: rocephin     Subjective: Pt c/o malaise   Objective: Vitals:   03/28/24 2330 03/29/24 0500 03/29/24 0509 03/29/24 0513  BP: (!) 140/47 138/62 (!) (P) 152/86 (!) (P) 142/72  Pulse:   (P) 62 (P) 73  Resp:      Temp:      TempSrc:      SpO2:      Weight:      Height:        Intake/Output Summary (Last 24 hours) at 03/29/2024 0842 Last data filed at 03/29/2024 0743 Gross per 24 hour  Intake 1810 ml  Output 1300 ml  Net 510 ml   Filed Weights   03/22/24 1000 03/22/24 1414  Weight: 69.4 kg 69.4 kg    Examination:  General exam: appears calm. Hard of hearing  Respiratory system: clear breath sounds b/l  Cardiovascular system: S1/S2+. No rubs or clicks   Gastrointestinal system:  abd is soft, NT, mild distention & hypoactive bowel sounds Central nervous system:  alert & awake. Moves all extremities   Psychiatry:judgement and insight appears at baseline. Flat mood and affect     Data Reviewed: I have personally reviewed following labs and imaging studies  CBC: Recent Labs  Lab 03/22/24 0850 03/23/24 0329 03/24/24 0409 03/25/24 0428 03/27/24 0439 03/28/24 0403  WBC 18.3* 11.1* 12.6* 13.4* 13.4* 12.8*  NEUTROABS 14.4*  --   --   --   --   --   HGB 9.3* 8.1* 9.8* 9.0* 9.0* 9.0*  HCT 30.8* 27.2* 32.4* 29.6* 29.3* 29.5*  MCV 97.2 96.1 96.1 95.5 93.3 94.6  PLT 208 166 203 225 291 328   Basic Metabolic Panel: Recent  Labs  Lab 03/23/24 0329 03/24/24 0409 03/25/24 0428 03/27/24 0439 03/28/24 0403  NA 134* 136 133* 135 134*  K 4.7 3.9 3.7 3.2* 3.0*  CL 108 109 107 102 105  CO2 20* 19* 18* 21* 16*  GLUCOSE 100* 124* 146* 145* 114*  BUN 49* 46* 46* 61* 67*  CREATININE 2.86* 2.87* 3.03* 3.91* 4.00*  CALCIUM  8.5* 8.3* 8.2* 8.0* 7.7*  MG  --   --   --  2.7* 2.7*   GFR: Estimated Creatinine Clearance: 10.3 mL/min (A) (by C-G formula based on SCr of 4 mg/dL (H)). Liver Function Tests: Recent Labs  Lab 03/22/24 0850 03/24/24 0409  AST 127* 18  ALT 87* 33  ALKPHOS 108 86  BILITOT 0.8 0.8  PROT 5.5* 5.5*  ALBUMIN  3.1* 2.7*   Recent Labs  Lab 03/22/24 0850  LIPASE 28   No results for input(s): AMMONIA in the last 168 hours. Coagulation Profile: No results for input(s): INR, PROTIME in the last 168 hours. Cardiac Enzymes: No results for input(s): CKTOTAL, CKMB, CKMBINDEX, TROPONINI in the last 168 hours. BNP (last 3 results) No results for input(s): PROBNP in the last 8760 hours. HbA1C: No results for input(s): HGBA1C in the last 72 hours. CBG: Recent Labs  Lab 03/28/24 0836 03/28/24 1211 03/28/24 1611 03/28/24 2028 03/29/24 0806  GLUCAP 110* 113* 119* 129* 143*   Lipid Profile: No results for input(s): CHOL, HDL, LDLCALC, TRIG, CHOLHDL, LDLDIRECT in the last 72 hours. Thyroid Function Tests: No results for input(s): TSH, T4TOTAL, FREET4, T3FREE, THYROIDAB in the last 72 hours. Anemia Panel: No results for input(s): VITAMINB12, FOLATE, FERRITIN, TIBC, IRON , RETICCTPCT in the last 72 hours. Sepsis Labs: Recent Labs  Lab 03/22/24 9061 03/22/24 1051  LATICACIDVEN 2.0* 1.4    Recent Results (from the past 240 hours)  Resp panel by RT-PCR (RSV, Flu A&B, Covid) Anterior Nasal Swab     Status: None   Collection Time: 03/22/24  8:50 AM   Specimen: Anterior Nasal Swab  Result Value Ref Range Status   SARS Coronavirus 2 by RT  PCR NEGATIVE NEGATIVE Final    Comment: (NOTE) SARS-CoV-2 target nucleic acids are NOT DETECTED.  The SARS-CoV-2 RNA is generally detectable in upper respiratory specimens during the acute phase of infection. The lowest concentration of SARS-CoV-2 viral copies this assay can detect is 138 copies/mL. A negative result does not preclude SARS-Cov-2 infection and should not be used as the sole basis for treatment or other patient management decisions. A negative result may occur with  improper specimen collection/handling, submission of specimen other than nasopharyngeal swab, presence of viral mutation(s) within the areas targeted by this assay, and inadequate number of viral copies(<138 copies/mL). A negative result must be combined with  clinical observations, patient history, and epidemiological information. The expected result is Negative.  Fact Sheet for Patients:  BloggerCourse.com  Fact Sheet for Healthcare Providers:  SeriousBroker.it  This test is no t yet approved or cleared by the [place]  FDA and  has been authorized for detection and/or diagnosis of SARS-CoV-2 by FDA under an Emergency Use Authorization (EUA). This EUA will remain  in effect (meaning this test can be used) for the duration of the COVID-19 declaration under Section 564(b)(1) of the Act, 21 U.S.C.section 360bbb-3(b)(1), unless the authorization is terminated  or revoked sooner.       Influenza A by PCR NEGATIVE NEGATIVE Final   Influenza B by PCR NEGATIVE NEGATIVE Final    Comment: (NOTE) The Xpert Xpress SARS-CoV-2/FLU/RSV plus assay is intended as an aid in the diagnosis of influenza from Nasopharyngeal swab specimens and should not be used as a sole basis for treatment. Nasal washings and aspirates are unacceptable for Xpert Xpress SARS-CoV-2/FLU/RSV testing.  Fact Sheet for Patients: BloggerCourse.com  Fact Sheet for  Healthcare Providers: SeriousBroker.it  This test is not yet approved or cleared by the [place]  FDA and has been authorized for detection and/or diagnosis of SARS-CoV-2 by FDA under an Emergency Use Authorization (EUA). This EUA will remain in effect (meaning this test can be used) for the duration of the COVID-19 declaration under Section 564(b)(1) of the Act, 21 U.S.C. section 360bbb-3(b)(1), unless the authorization is terminated or revoked.     Resp Syncytial Virus by PCR NEGATIVE NEGATIVE Final    Comment: (NOTE) Fact Sheet for Patients: BloggerCourse.com  Fact Sheet for Healthcare Providers: SeriousBroker.it  This test is not yet approved or cleared by the [place]  FDA and has been authorized for detection and/or diagnosis of SARS-CoV-2 by FDA under an Emergency Use Authorization (EUA). This EUA will remain in effect (meaning this test can be used) for the duration of the COVID-19 declaration under Section 564(b)(1) of the Act, 21 U.S.C. section 360bbb-3(b)(1), unless the authorization is terminated or revoked.  Performed at [facility], [address], [place], [place] [address]   Urine Culture (for pregnant, neutropenic or urologic patients or patients with an indwelling urinary catheter)     Status: Abnormal   Collection Time: 03/22/24  8:50 AM   Specimen: Urine, Catheterized  Result Value Ref Range Status   Specimen Description   Final    URINE, CATHETERIZED Performed at [facility], [address] [place], [place] [address]    Special Requests   Final    NONE Performed at [facility], [address] [place], [place] [address]    Culture >=100,000 COLONIES/mL ESCHERICHIA COLI (A)  Final   Report Status 03/24/2024 FINAL  Final   Organism ID, Bacteria ESCHERICHIA COLI (A)  Final      Susceptibility   Escherichia coli - MIC*    AMPICILLIN >=32  RESISTANT Resistant     CEFAZOLIN  RESISTANT Resistant     CEFEPIME  <=0.12 SENSITIVE Sensitive     CEFTRIAXONE  <=0.25 SENSITIVE Sensitive     CIPROFLOXACIN  >=4 RESISTANT Resistant     GENTAMICIN <=1 SENSITIVE Sensitive     IMIPENEM <=0.25 SENSITIVE Sensitive     NITROFURANTOIN  <=16 SENSITIVE Sensitive     TRIMETH /SULFA  >=320 RESISTANT Resistant     AMPICILLIN/SULBACTAM >=32 RESISTANT Resistant     PIP/TAZO 16 SENSITIVE Sensitive ug/mL    * >=100,000 COLONIES/mL ESCHERICHIA COLI  Blood culture (routine x 2)     Status: Abnormal  Collection Time: 03/22/24  9:38 AM   Specimen: BLOOD  Result Value Ref Range Status   Specimen Description   Final    BLOOD LEFT ANTECUBITAL Performed at [facility], [address] [place], [place] [address]    Special Requests   Final    BOTTLES DRAWN AEROBIC AND ANAEROBIC Blood Culture results may not be optimal due to an inadequate volume of blood received in culture bottles Performed at [facility], [address] [place], [place] [address]    Culture  Setup Time   Final    GRAM POSITIVE COCCI AEROBIC BOTTLE ONLY CRITICAL RESULT CALLED TO, READ BACK BY AND VERIFIED WITH: JASON ROBBINS 03/23/24 0445 MW    Culture (A)  Final    STAPHYLOCOCCUS WARNERI THE SIGNIFICANCE OF ISOLATING THIS ORGANISM FROM A SINGLE SET OF BLOOD CULTURES WHEN MULTIPLE SETS ARE DRAWN IS UNCERTAIN. PLEASE NOTIFY THE MICROBIOLOGY DEPARTMENT WITHIN ONE WEEK IF SPECIATION AND SENSITIVITIES ARE REQUIRED. Performed at [facility], [address] [place], [place] [address]    Report Status 03/25/2024 FINAL  Final  Blood culture (routine x 2)     Status: None   Collection Time: 03/22/24  9:38 AM   Specimen: BLOOD  Result Value Ref Range Status   Specimen Description BLOOD BLOOD LEFT ARM  Final   Special Requests   Final    BOTTLES DRAWN AEROBIC AND ANAEROBIC Blood Culture adequate volume   Culture   Final    NO GROWTH 5 DAYS Performed at [facility], [address] [place], [place] [address]    Report Status 03/27/2024 FINAL  Final  Blood Culture ID Panel (Reflexed)     Status: Abnormal   Collection Time: 03/22/24  9:38 AM  Result Value Ref Range Status   Enterococcus faecalis NOT DETECTED NOT DETECTED Final   Enterococcus Faecium NOT DETECTED NOT DETECTED Final   Listeria monocytogenes NOT DETECTED NOT DETECTED Final   Staphylococcus species DETECTED (A) NOT DETECTED Final    Comment: CRITICAL RESULT CALLED TO, READ BACK BY AND VERIFIED WITH: JASON ROBBINS 03/23/24 0445 MW    Staphylococcus aureus (BCID) NOT DETECTED NOT DETECTED Final   Staphylococcus epidermidis NOT DETECTED NOT DETECTED Final   Staphylococcus lugdunensis NOT DETECTED NOT DETECTED Final   Streptococcus species NOT DETECTED NOT DETECTED Final   Streptococcus agalactiae NOT DETECTED NOT DETECTED Final   Streptococcus pneumoniae NOT DETECTED NOT DETECTED Final   Streptococcus pyogenes NOT DETECTED NOT DETECTED Final   A.calcoaceticus-baumannii NOT DETECTED NOT DETECTED Final   Bacteroides fragilis NOT DETECTED NOT DETECTED Final   Enterobacterales NOT DETECTED NOT DETECTED Final   Enterobacter cloacae complex NOT DETECTED NOT DETECTED Final   Escherichia coli NOT DETECTED NOT DETECTED Final   Klebsiella aerogenes NOT DETECTED NOT DETECTED Final   Klebsiella oxytoca NOT DETECTED NOT DETECTED Final   Klebsiella pneumoniae NOT DETECTED NOT DETECTED Final   Proteus species NOT DETECTED NOT DETECTED Final   Salmonella species NOT DETECTED NOT DETECTED Final   Serratia marcescens NOT DETECTED NOT DETECTED Final   Haemophilus influenzae NOT DETECTED NOT DETECTED Final   Neisseria meningitidis NOT DETECTED NOT DETECTED Final   Pseudomonas aeruginosa NOT DETECTED NOT DETECTED Final   Stenotrophomonas maltophilia NOT DETECTED NOT DETECTED Final   Candida albicans NOT DETECTED NOT DETECTED Final   Candida auris NOT DETECTED NOT DETECTED Final   Candida  glabrata NOT DETECTED NOT DETECTED Final   Candida krusei NOT DETECTED NOT DETECTED Final  Candida parapsilosis NOT DETECTED NOT DETECTED Final   Candida tropicalis NOT DETECTED NOT DETECTED Final   Cryptococcus neoformans/gattii NOT DETECTED NOT DETECTED Final    Comment: Performed at [facility], [address] [place], [place] [address]  MRSA Next Gen by PCR, Nasal     Status: None   Collection Time: 03/23/24  9:20 AM   Specimen: Nasal Mucosa; Nasal Swab  Result Value Ref Range Status   MRSA by PCR Next Gen NOT DETECTED NOT DETECTED Final    Comment: (NOTE) The GeneXpert MRSA Assay (FDA approved for NASAL specimens only), is one component of a comprehensive MRSA colonization surveillance program. It is not intended to diagnose MRSA infection nor to guide or monitor treatment for MRSA infections. Test performance is not FDA approved in patients less than 107 years old. Performed at [facility], [address] [place], [place] [address]   Culture, blood (single) w Reflex to ID Panel     Status: None   Collection Time: 03/23/24  4:45 PM   Specimen: BLOOD  Result Value Ref Range Status   Specimen Description BLOOD BLOOD RIGHT HAND  Final   Special Requests   Final    BOTTLES DRAWN AEROBIC AND ANAEROBIC Blood Culture adequate volume   Culture   Final    NO GROWTH 5 DAYS Performed at [facility], [address] [place], [place] [address]    Report Status 03/28/2024 FINAL  Final  Culture, blood (Routine X 2) w Reflex to ID Panel     Status: None (Preliminary result)   Collection Time: 03/25/24 12:49 PM   Specimen: BLOOD  Result Value Ref Range Status   Specimen Description BLOOD BLOOD LEFT HAND  Final   Special Requests   Final    BOTTLES DRAWN AEROBIC AND ANAEROBIC Blood Culture adequate volume   Culture   Final    NO GROWTH 4 DAYS Performed at [facility], [address], [place], [place] [address]    Report Status PENDING   Incomplete  Culture, blood (Routine X 2) w Reflex to ID Panel     Status: None (Preliminary result)   Collection Time: 03/25/24 12:50 PM   Specimen: BLOOD  Result Value Ref Range Status   Specimen Description BLOOD BLOOD RIGHT HAND  Final   Special Requests   Final    BOTTLES DRAWN AEROBIC AND ANAEROBIC Blood Culture results may not be optimal due to an inadequate volume of blood received in culture bottles   Culture   Final    NO GROWTH 4 DAYS Performed at [facility], [address] [place], [place] [address]    Report Status PENDING  Incomplete         Radiology Studies: DG Abd Portable 1V-Small Bowel Obstruction Protocol-initial, 8 hr delay Result Date: 03/29/2024 CLINICAL DATA:  Small-bowel obstruction protocol, 8 hour delay. EXAM: PORTABLE ABDOMEN - 1 VIEW COMPARISON:  Radiographs 03/28/2024 at 7:53 a.m. FINDINGS: Enteric tube tip and side-port in the stomach. Right ureteral stent. Extensive thoracolumbar fusion hardware. Enteric contrast is seen within the ascending and descending colon. Continued mild improvement of the dilated bowel loops throughout the abdomen. IMPRESSION: Continued mild improvement of the dilated bowel loops throughout the abdomen. Enteric contrast is seen within the ascending and descending colon. Electronically Signed   By: Norman Gatlin M.D.   On: 03/29/2024 01:58   DG ABD ACUTE 2+V W 1V CHEST Result Date: 03/28/2024 CLINICAL DATA:  Follow-up dilated bowel loops. Clinical  diagnosis of small-bowel obstruction. EXAM: DG ABDOMEN ACUTE WITH 1 VIEW CHEST COMPARISON:  03/27/2024 and abdomen CT dated 03/27/2024 FINDINGS: The nasogastric tube has been retracted with its tip in the lateral right mid abdomen in a more medial location than previously seen. The side hole is in the medial right upper abdomen. Mildly improved dilated bowel loops. Stable spine and sacral fixation hardware, pelvic surgical clips and right ureteral stent. IMPRESSION: 1. Mildly  improved dilated bowel loops. 2. The nasogastric tube has been retracted with its tip in the lateral right mid abdomen in a more medial location than previously seen. Electronically Signed   By: Elspeth Bathe M.D.   On: 03/28/2024 11:43   CT ABDOMEN WO CONTRAST Result Date: 03/27/2024 CLINICAL DATA:  NG tube placement with question of extraluminal position. EXAM: CT ABDOMEN WITHOUT CONTRAST TECHNIQUE: Multidetector CT imaging of the abdomen was performed following the standard protocol without IV contrast. RADIATION DOSE REDUCTION: This exam was performed according to the departmental dose-optimization program which includes automated exposure control, adjustment of the mA and/or kV according to patient size and/or use of iterative reconstruction technique. COMPARISON:  March 22, 2024 FINDINGS: Lower chest: Marked severity posterior right lower lobe consolidation is seen. Small bilateral pleural effusions are also noted, right greater than left. Hepatobiliary: No focal liver abnormality is seen. No gallstones, gallbladder wall thickening, or biliary dilatation. Pancreas: Unremarkable. No pancreatic ductal dilatation or surrounding inflammatory changes. Spleen: Normal in size without focal abnormality. Adrenals/Urinary Tract: Adrenal glands are unremarkable. Kidneys are normal in size, without renal calculi or focal lesions. A right-sided endo ureteral stent is noted. Stomach/Bowel: Enteric tube is seen. It travels through the gastric body, gastric antrum and duodenal bulb. Its distal tip extends approximately 8 mm into the wall of superior part of the proximal duodenum, without evidence of complete perforation. Less than 1 mm of duodenal wall is seen in between the distal tip and adjacent mesenteric fat. Marked severity mural thickening of the duodenal bulb and proximal duodenum is seen. The appendix is not identified. Dilated air-filled loops of bowel are seen within the anterior aspect of the mid and upper  abdomen. Vascular/Lymphatic: Aortic atherosclerosis. No enlarged abdominal lymph nodes. Other: No abdominal wall hernia or abnormality. Musculoskeletal: Multilevel postoperative and degenerative changes seen throughout the visualized portion of the lumbar spine. IMPRESSION: 1. Enteric tube positioning, as described above, which extends into the wall of the proximal duodenum without evidence of complete perforation. 2. Marked severity mural thickening of the duodenal bulb and proximal duodenum, which may represent sequelae associated with duodenitis. 3. Dilated air-filled loops of bowel within the anterior aspect of the mid and upper abdomen, which may represent ileus. 4. Marked severity posterior right lower lobe consolidation. 5. Small bilateral pleural effusions, right greater than left. 6. Right-sided endo ureteral stent. 7. Aortic atherosclerosis. Electronically Signed   By: Suzen Dials M.D.   On: 03/27/2024 19:21   DG Abd 1 View Result Date: 03/27/2024 CLINICAL DATA:  [id number] Nasogastric tube present [id number] EXAM: ABDOMEN - 1 VIEW COMPARISON:  March 27, 2024 7:13 a.m. FINDINGS: Esophagogastric tube courses below the diaphragm with the distal tip overlying the right upper quadrant. Gaseous distension of a few segments of small bowel and colon in the upper abdomen. No pneumoperitoneum. Thoracolumbar fusion hardware partially visualized. The lung bases are clear. IMPRESSION: Esophagogastric tube courses below the diaphragm with the distal tip overlying the right upper quadrant. While this could be within the gastric antrum or proximal duodenum, this seems  further lateral than would be expected and a repeat KUB with enteric contrast is recommended to confirm tube intraluminal positioning. These results will be called to the ordering clinician or representative by the Radiologist Assistant and communication documented in the PACS or [employer]. Electronically Signed   By: Rogelia Myers M.D.   On:  03/27/2024 17:38        Scheduled Meds:  brimonidine   1 drop Both Eyes BID   Chlorhexidine  Gluconate Cloth  6 each Topical Daily   cloNIDine   0.1 mg Oral Daily   diltiazem   300 mg Oral Daily   enoxaparin  (LOVENOX ) injection  30 mg Subcutaneous Q24H   hydrALAZINE   50 mg Oral Q8H   insulin  aspart  0-9 Units Subcutaneous TID WC   metoCLOPramide   5 mg Oral BID   metoprolol  tartrate  50 mg Oral BID   pantoprazole   40 mg Oral Daily   polyethylene glycol  17 g Oral Daily   rosuvastatin   10 mg Oral Daily   sodium bicarbonate   650 mg Oral BID   sodium chloride  flush  3 mL Intravenous Q12H   sodium phosphate   1 enema Rectal BID   Continuous Infusions:  cefTRIAXone  (ROCEPHIN )  IV 200 mL/hr at 03/29/24 0743     LOS: 7 days       Anthony CHRISTELLA Pouch, MD [facility] Pager [phone]  If 7PM-7AM, please contact night-coverage [email or web address] 03/29/2024, 8:42 AM

## 2024-03-30 ENCOUNTER — Inpatient Hospital Stay

## 2024-03-30 DIAGNOSIS — K56609 Unspecified intestinal obstruction, unspecified as to partial versus complete obstruction: Secondary | ICD-10-CM | POA: Diagnosis not present

## 2024-03-30 DIAGNOSIS — K567 Ileus, unspecified: Secondary | ICD-10-CM | POA: Diagnosis not present

## 2024-03-30 DIAGNOSIS — N390 Urinary tract infection, site not specified: Secondary | ICD-10-CM | POA: Diagnosis not present

## 2024-03-30 DIAGNOSIS — A419 Sepsis, unspecified organism: Secondary | ICD-10-CM | POA: Diagnosis not present

## 2024-03-30 LAB — CBC
HCT: 30.9 % — ABNORMAL LOW (ref 36.0–46.0)
Hemoglobin: 9.2 g/dL — ABNORMAL LOW (ref 12.0–15.0)
MCH: 28.7 pg (ref 26.0–34.0)
MCHC: 29.8 g/dL — ABNORMAL LOW (ref 30.0–36.0)
MCV: 96.3 fL (ref 80.0–100.0)
Platelets: 324 10*3/uL (ref 150–400)
RBC: 3.21 MIL/uL — ABNORMAL LOW (ref 3.87–5.11)
RDW: 15.3 % (ref 11.5–15.5)
WBC: 12.2 10*3/uL — ABNORMAL HIGH (ref 4.0–10.5)
nRBC: 0 % (ref 0.0–0.2)

## 2024-03-30 LAB — BASIC METABOLIC PANEL WITH GFR
Anion gap: 15 (ref 5–15)
BUN: 72 mg/dL — ABNORMAL HIGH (ref 8–23)
CO2: 16 mmol/L — ABNORMAL LOW (ref 22–32)
Calcium: 8 mg/dL — ABNORMAL LOW (ref 8.9–10.3)
Chloride: 105 mmol/L (ref 98–111)
Creatinine, Ser: 4.32 mg/dL — ABNORMAL HIGH (ref 0.44–1.00)
GFR, Estimated: 10 mL/min — ABNORMAL LOW (ref >60)
Glucose, Bld: 213 mg/dL — ABNORMAL HIGH (ref 70–99)
Potassium: 3.3 mmol/L — ABNORMAL LOW (ref 3.5–5.1)
Sodium: 136 mmol/L (ref 135–145)

## 2024-03-30 LAB — GLUCOSE, CAPILLARY
Glucose-Capillary: 143 mg/dL — ABNORMAL HIGH (ref 70–99)
Glucose-Capillary: 114 mg/dL — ABNORMAL HIGH (ref 70–99)
Glucose-Capillary: 201 mg/dL — ABNORMAL HIGH (ref 70–99)
Glucose-Capillary: 194 mg/dL — ABNORMAL HIGH (ref 70–99)
Glucose-Capillary: 319 mg/dL — ABNORMAL HIGH (ref 70–99)
Glucose-Capillary: 341 mg/dL — ABNORMAL HIGH (ref 70–99)
Glucose-Capillary: 291 mg/dL — ABNORMAL HIGH (ref 70–99)

## 2024-03-30 LAB — CULTURE, BLOOD (ROUTINE X 2)
Culture: NO GROWTH
Special Requests: ADEQUATE

## 2024-03-30 MED ORDER — POTASSIUM CHLORIDE CRYS ER 20 MEQ PO TBCR
20.0000 meq | EXTENDED_RELEASE_TABLET | Freq: Once | ORAL | Status: AC
  Administered 2024-03-30: 20 meq via ORAL
  Filled 2024-03-30: qty 1

## 2024-03-30 NOTE — Progress Notes (Signed)
 PROGRESS NOTE    Stephanie [name]  FMW:[id number] DOB: 1941/07/06 DOA: 03/22/2024 PCP: Cleotilde Oneil FALCON, MD   Assessment & Plan:   Principal Problem:   Sepsis secondary to UTI [facility]) Active Problems:   DM type 2 with diabetic mixed hyperlipidemia (HCC)   Rheumatoid arthritis involving multiple sites with positive rheumatoid factor (HCC)   Pure hypercholesterolemia   CKD (chronic kidney disease) stage 5, GFR less than 15 ml/min (HCC)   UPJ obstruction, acquired   Type II diabetes mellitus with renal manifestations (HCC)   Anemia due to stage 4 chronic kidney disease (HCC)  Assessment and Plan: Sepsis due to UTI: urine cx growing e. coli. Foley exchanged in the ER. Likely secondary to chronic foley. Continue on IV rocephin  x 10 days as per ID. ID following and recs apprec    Staph bacteremia: staph warneri, etiology unclear. Likely containment. Repeat blood cxs NGTD   H/o urinary retention and hydronephrosis: foley exchanged in the ER. S/p stent. Mild right hydronephrosis as per renal US . May need to have a stent replaced vs having stent replaced if another infection occurs   Partial SBO: as per XR. Resolving. NG tube removed. Diet advanced to full liquid diet today as per gen surg. 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  Will transfuse if Hb < 7.0   DM2: fair control, HbA1c 7.2. Continue on SSI w/ accuchecks    Metabolic acidosis: continue on bicarb. Likely secondary to CKD    HLD: continue on statin   HTN: continue on home dose of metoprolol , diltiazem , hydralazine  & clonidine  (started this admission). Will need to check orthostatic vital signs as significant concern for orthostatic hypotension after a syncopal event when working with therapy 03/25/24.   RA: holding home dose of leflunomide  w/ active infection    COPD: w/o  exacerbation. Continue on bronchodilators   Glaucoma: continue on home dose of brimonidine           DVT prophylaxis: lovenox   Code Status: full  Family Communication: discussed pt's care w/ pt's family at bedside and answered their questions  Disposition Plan: likely d/c to SNF  Level of care: Telemetry Medical  Status is: Inpatient Remains inpatient appropriate because: severity of illness. Needs SNF placement     Consultants:  ID Gen surg   Procedures:  Antimicrobials: rocephin     Subjective: Pt c/o fatigue   Objective: Vitals:   03/29/24 1554 03/29/24 2015 03/30/24 0401 03/30/24 0807  BP: (!) 119/43 (!) 133/56 (!) 185/60 (!) 123/47  Pulse: (!) 45 (!) 51 (!) 54 (!) 53  Resp: 18 16 20 15   Temp:  97.6 F (36.4 C) 97.9 F (36.6 C)   TempSrc: Oral     SpO2: 96% 99% 96% 97%  Weight:      Height:        Intake/Output Summary (Last 24 hours) at 03/30/2024 0851 Last data filed at 03/30/2024 0831 Gross per 24 hour  Intake 910.91 ml  Output 600 ml  Net 310.91 ml   Filed Weights   03/22/24 1000 03/22/24 1414  Weight: 69.4 kg 69.4 kg    Examination:  General exam: appears comfortable. Hard of hearing  Respiratory system: clear breath sounds b/l  Cardiovascular system: S1 & S2+. No rubs or clicks  Gastrointestinal system: abd is soft, NT, mild distention, hypoactive  bowel sounds Central nervous system:  alert & awake. Moves all extremities   Psychiatry:  judgement and insight appears at baseline. Flat mood and affect    Data Reviewed: I have personally reviewed following labs and imaging studies  CBC: Recent Labs  Lab 03/25/24 0428 03/27/24 0439 03/28/24 0403 03/29/24 0853 03/30/24 0427  WBC 13.4* 13.4* 12.8* 14.0* 12.2*  HGB 9.0* 9.0* 9.0* 9.1* 9.2*  HCT 29.6* 29.3* 29.5* 29.9* 30.9*  MCV 95.5 93.3 94.6 96.1 96.3  PLT 225 291 328 428* 324   Basic Metabolic Panel: Recent Labs  Lab 03/25/24 0428 03/27/24 0439 03/28/24 0403 03/29/24 0853  03/30/24 0427  NA 133* 135 134* 138 136  K 3.7 3.2* 3.0* 3.0* 3.3*  CL 107 102 105 107 105  CO2 18* 21* 16* 15* 16*  GLUCOSE 146* 145* 114* 121* 213*  BUN 46* 61* 67* 75* 72*  CREATININE 3.03* 3.91* 4.00* 4.67* 4.32*  CALCIUM  8.2* 8.0* 7.7* 8.0* 8.0*  MG  --  2.7* 2.7*  --   --    GFR: Estimated Creatinine Clearance: 9.6 mL/min (A) (by C-G formula based on SCr of 4.32 mg/dL (H)). Liver Function Tests: Recent Labs  Lab 03/24/24 0409  AST 18  ALT 33  ALKPHOS 86  BILITOT 0.8  PROT 5.5*  ALBUMIN  2.7*   No results for input(s): LIPASE, AMYLASE in the last 168 hours.  No results for input(s): AMMONIA in the last 168 hours. Coagulation Profile: No results for input(s): INR, PROTIME in the last 168 hours. Cardiac Enzymes: No results for input(s): CKTOTAL, CKMB, CKMBINDEX, TROPONINI in the last 168 hours. BNP (last 3 results) No results for input(s): PROBNP in the last 8760 hours. HbA1C: No results for input(s): HGBA1C in the last 72 hours. CBG: Recent Labs  Lab 03/28/24 2028 03/29/24 0806 03/29/24 1222 03/29/24 2107 03/30/24 0812  GLUCAP 129* 143* 167* 287* 201*   Lipid Profile: No results for input(s): CHOL, HDL, LDLCALC, TRIG, CHOLHDL, LDLDIRECT in the last 72 hours. Thyroid Function Tests: No results for input(s): TSH, T4TOTAL, FREET4, T3FREE, THYROIDAB in the last 72 hours. Anemia Panel: No results for input(s): VITAMINB12, FOLATE, FERRITIN, TIBC, IRON , RETICCTPCT in the last 72 hours. Sepsis Labs: No results for input(s): PROCALCITON, LATICACIDVEN in the last 168 hours.   Recent Results (from the past 240 hours)  Resp panel by RT-PCR (RSV, Flu A&B, Covid) Anterior Nasal Swab     Status: None   Collection Time: 03/22/24  8:50 AM   Specimen: Anterior Nasal Swab  Result Value Ref Range Status   SARS Coronavirus 2 by RT PCR NEGATIVE NEGATIVE Final    Comment: (NOTE) SARS-CoV-2 target nucleic acids are  NOT DETECTED.  The SARS-CoV-2 RNA is generally detectable in upper respiratory specimens during the acute phase of infection. The lowest concentration of SARS-CoV-2 viral copies this assay can detect is 138 copies/mL. A negative result does not preclude SARS-Cov-2 infection and should not be used as the sole basis for treatment or other patient management decisions. A negative result may occur with  improper specimen collection/handling, submission of specimen other than nasopharyngeal swab, presence of viral mutation(s) within the areas targeted by this assay, and inadequate number of viral copies(<138 copies/mL). A negative result must be combined with clinical observations, patient history, and epidemiological information. The expected result is Negative.  Fact Sheet for Patients:  BloggerCourse.com  Fact Sheet for Healthcare Providers:  SeriousBroker.it  This test is no t yet approved or cleared by the [place]   FDA and  has been authorized for detection and/or diagnosis of SARS-CoV-2 by FDA under an Emergency Use Authorization (EUA). This EUA will remain  in effect (meaning this test can be used) for the duration of the COVID-19 declaration under Section 564(b)(1) of the Act, 21 U.S.C.section 360bbb-3(b)(1), unless the authorization is terminated  or revoked sooner.       Influenza A by PCR NEGATIVE NEGATIVE Final   Influenza B by PCR NEGATIVE NEGATIVE Final    Comment: (NOTE) The Xpert Xpress SARS-CoV-2/FLU/RSV plus assay is intended as an aid in the diagnosis of influenza from Nasopharyngeal swab specimens and should not be used as a sole basis for treatment. Nasal washings and aspirates are unacceptable for Xpert Xpress SARS-CoV-2/FLU/RSV testing.  Fact Sheet for Patients: BloggerCourse.com  Fact Sheet for Healthcare Providers: SeriousBroker.it  This test is not  yet approved or cleared by the [place]  FDA and has been authorized for detection and/or diagnosis of SARS-CoV-2 by FDA under an Emergency Use Authorization (EUA). This EUA will remain in effect (meaning this test can be used) for the duration of the COVID-19 declaration under Section 564(b)(1) of the Act, 21 U.S.C. section 360bbb-3(b)(1), unless the authorization is terminated or revoked.     Resp Syncytial Virus by PCR NEGATIVE NEGATIVE Final    Comment: (NOTE) Fact Sheet for Patients: BloggerCourse.com  Fact Sheet for Healthcare Providers: SeriousBroker.it  This test is not yet approved or cleared by the [place]  FDA and has been authorized for detection and/or diagnosis of SARS-CoV-2 by FDA under an Emergency Use Authorization (EUA). This EUA will remain in effect (meaning this test can be used) for the duration of the COVID-19 declaration under Section 564(b)(1) of the Act, 21 U.S.C. section 360bbb-3(b)(1), unless the authorization is terminated or revoked.  Performed at [facility], [address], [place], [place] [address]   Urine Culture (for pregnant, neutropenic or urologic patients or patients with an indwelling urinary catheter)     Status: Abnormal   Collection Time: 03/22/24  8:50 AM   Specimen: Urine, Catheterized  Result Value Ref Range Status   Specimen Description   Final    URINE, CATHETERIZED Performed at [facility] [address] [place], [place] [address]    Special Requests   Final    NONE Performed at [facility], [address] [place], [place] [address]    Culture >=100,000 COLONIES/mL ESCHERICHIA COLI (A)  Final   Report Status 03/24/2024 FINAL  Final   Organism ID, Bacteria ESCHERICHIA COLI (A)  Final      Susceptibility   Escherichia coli - MIC*    AMPICILLIN >=32 RESISTANT Resistant     CEFAZOLIN  RESISTANT Resistant     CEFEPIME  <=0.12 SENSITIVE  Sensitive     CEFTRIAXONE  <=0.25 SENSITIVE Sensitive     CIPROFLOXACIN  >=4 RESISTANT Resistant     GENTAMICIN <=1 SENSITIVE Sensitive     IMIPENEM <=0.25 SENSITIVE Sensitive     NITROFURANTOIN  <=16 SENSITIVE Sensitive     TRIMETH /SULFA  >=320 RESISTANT Resistant     AMPICILLIN/SULBACTAM >=32 RESISTANT Resistant     PIP/TAZO 16 SENSITIVE Sensitive ug/mL    * >=100,000 COLONIES/mL ESCHERICHIA COLI  Blood culture (routine x 2)     Status: Abnormal   Collection Time: 03/22/24  9:38 AM   Specimen: BLOOD  Result Value Ref Range Status   Specimen Description   Final    BLOOD LEFT ANTECUBITAL Performed at [facility], [address], [place], [place]  [address]    Special Requests   Final    BOTTLES DRAWN AEROBIC AND ANAEROBIC Blood Culture results may not be optimal due to an inadequate volume of blood received in culture bottles Performed at [facility], [address] [place], [place] [address]    Culture  Setup Time   Final    GRAM POSITIVE COCCI AEROBIC BOTTLE ONLY CRITICAL RESULT CALLED TO, READ BACK BY AND VERIFIED WITH: JASON ROBBINS 03/23/24 0445 MW    Culture (A)  Final    STAPHYLOCOCCUS WARNERI THE SIGNIFICANCE OF ISOLATING THIS ORGANISM FROM A SINGLE SET OF BLOOD CULTURES WHEN MULTIPLE SETS ARE DRAWN IS UNCERTAIN. PLEASE NOTIFY THE MICROBIOLOGY DEPARTMENT WITHIN ONE WEEK IF SPECIATION AND SENSITIVITIES ARE REQUIRED. Performed at [facility], [address], [place], [place] [address]    Report Status 03/25/2024 FINAL  Final  Blood culture (routine x 2)     Status: None   Collection Time: 03/22/24  9:38 AM   Specimen: BLOOD  Result Value Ref Range Status   Specimen Description BLOOD BLOOD LEFT ARM  Final   Special Requests   Final    BOTTLES DRAWN AEROBIC AND ANAEROBIC Blood Culture adequate volume   Culture   Final    NO GROWTH 5 DAYS Performed at [facility], [address] [place], [place] [address]    Report Status 03/27/2024  FINAL  Final  Blood Culture ID Panel (Reflexed)     Status: Abnormal   Collection Time: 03/22/24  9:38 AM  Result Value Ref Range Status   Enterococcus faecalis NOT DETECTED NOT DETECTED Final   Enterococcus Faecium NOT DETECTED NOT DETECTED Final   Listeria monocytogenes NOT DETECTED NOT DETECTED Final   Staphylococcus species DETECTED (A) NOT DETECTED Final    Comment: CRITICAL RESULT CALLED TO, READ BACK BY AND VERIFIED WITH: JASON ROBBINS 03/23/24 0445 MW    Staphylococcus aureus (BCID) NOT DETECTED NOT DETECTED Final   Staphylococcus epidermidis NOT DETECTED NOT DETECTED Final   Staphylococcus lugdunensis NOT DETECTED NOT DETECTED Final   Streptococcus species NOT DETECTED NOT DETECTED Final   Streptococcus agalactiae NOT DETECTED NOT DETECTED Final   Streptococcus pneumoniae NOT DETECTED NOT DETECTED Final   Streptococcus pyogenes NOT DETECTED NOT DETECTED Final   A.calcoaceticus-baumannii NOT DETECTED NOT DETECTED Final   Bacteroides fragilis NOT DETECTED NOT DETECTED Final   Enterobacterales NOT DETECTED NOT DETECTED Final   Enterobacter cloacae complex NOT DETECTED NOT DETECTED Final   Escherichia coli NOT DETECTED NOT DETECTED Final   Klebsiella aerogenes NOT DETECTED NOT DETECTED Final   Klebsiella oxytoca NOT DETECTED NOT DETECTED Final   Klebsiella pneumoniae NOT DETECTED NOT DETECTED Final   Proteus species NOT DETECTED NOT DETECTED Final   Salmonella species NOT DETECTED NOT DETECTED Final   Serratia marcescens NOT DETECTED NOT DETECTED Final   Haemophilus influenzae NOT DETECTED NOT DETECTED Final   Neisseria meningitidis NOT DETECTED NOT DETECTED Final   Pseudomonas aeruginosa NOT DETECTED NOT DETECTED Final   Stenotrophomonas maltophilia NOT DETECTED NOT DETECTED Final   Candida albicans NOT DETECTED NOT DETECTED Final   Candida auris NOT DETECTED NOT DETECTED Final   Candida glabrata NOT DETECTED NOT DETECTED Final   Candida krusei NOT DETECTED NOT DETECTED Final    Candida parapsilosis NOT DETECTED NOT DETECTED Final   Candida tropicalis NOT DETECTED NOT DETECTED Final   Cryptococcus neoformans/gattii NOT DETECTED NOT DETECTED Final    Comment: Performed at [facility], [address]  [place], [place] [address]  MRSA Next Gen by PCR, Nasal     Status: None   Collection Time: 03/23/24  9:20 AM   Specimen: Nasal Mucosa; Nasal Swab  Result Value Ref Range Status   MRSA by PCR Next Gen NOT DETECTED NOT DETECTED Final    Comment: (NOTE) The GeneXpert MRSA Assay (FDA approved for NASAL specimens only), is one component of a comprehensive MRSA colonization surveillance program. It is not intended to diagnose MRSA infection nor to guide or monitor treatment for MRSA infections. Test performance is not FDA approved in patients less than 76 years old. Performed at [facility], [address] [place], [place] [address]   Culture, blood (single) w Reflex to ID Panel     Status: None   Collection Time: 03/23/24  4:45 PM   Specimen: BLOOD  Result Value Ref Range Status   Specimen Description BLOOD BLOOD RIGHT HAND  Final   Special Requests   Final    BOTTLES DRAWN AEROBIC AND ANAEROBIC Blood Culture adequate volume   Culture   Final    NO GROWTH 5 DAYS Performed at [facility], [address] [place], [place] [address]    Report Status 03/28/2024 FINAL  Final  Culture, blood (Routine X 2) w Reflex to ID Panel     Status: None   Collection Time: 03/25/24 12:49 PM   Specimen: BLOOD  Result Value Ref Range Status   Specimen Description BLOOD BLOOD LEFT HAND  Final   Special Requests   Final    BOTTLES DRAWN AEROBIC AND ANAEROBIC Blood Culture adequate volume   Culture   Final    NO GROWTH 5 DAYS Performed at [facility], [address] [place], [place] [address]    Report Status 03/30/2024 FINAL  Final  Culture, blood (Routine X 2) w Reflex to ID Panel     Status: None   Collection Time: 03/25/24  12:50 PM   Specimen: BLOOD  Result Value Ref Range Status   Specimen Description BLOOD BLOOD RIGHT HAND  Final   Special Requests   Final    BOTTLES DRAWN AEROBIC AND ANAEROBIC Blood Culture results may not be optimal due to an inadequate volume of blood received in culture bottles   Culture   Final    NO GROWTH 5 DAYS Performed at [facility], [address] [place], [place] [address]    Report Status 03/30/2024 FINAL  Final         Radiology Studies: US  RENAL Result Date: 03/29/2024 CLINICAL DATA:  Hydronephrosis. EXAM: RENAL / URINARY TRACT ULTRASOUND COMPLETE COMPARISON:  03/27/2024, 01/09/2024. FINDINGS: Right Kidney: Renal measurements: 8.8 x 3.9 x 4.7 cm = volume: 85.5 mL. Increased parenchymal echogenicity. Multiple cysts are identified, the largest measuring 1.7 x 1.1 x 1.3 cm. There is mild hydronephrosis with ureteral stent in place. Left Kidney: Renal measurements: 9.7 x 4.9 x 5.0 cm = volume: 123.5 mL. Increased parenchymal echogenicity. Multiple cysts are identified, largest measuring 1.5 x 0.9 x 1.1 cm. No mass or hydronephrosis visualized. Bladder: The bladder is minimally distended in a Foley catheter is noted. Other: None. IMPRESSION: 1. Mild hydronephrosis on the right with ureteral stent in place. 2. Increased renal echogenicity and cysts bilaterally, compatible with medical renal disease. 3. Foley catheter in place. Electronically Signed   By: Leita Birmingham M.D.   On: 03/29/2024 14:12   DG Abd Portable 1V-Small Bowel Obstruction Protocol-initial, 8 hr delay Result Date: 03/29/2024 CLINICAL DATA:  Small-bowel obstruction protocol, 8 hour delay. EXAM: PORTABLE ABDOMEN - 1 VIEW COMPARISON:  Radiographs 03/28/2024 at 7:53 a.m. FINDINGS: Enteric tube tip and side-port in the stomach. Right ureteral stent. Extensive thoracolumbar fusion hardware. Enteric contrast is seen within the ascending and descending colon. Continued mild improvement of the dilated bowel loops  throughout the abdomen. IMPRESSION: Continued mild improvement of the dilated bowel loops throughout the abdomen. Enteric contrast is seen within the ascending and descending colon. Electronically Signed   By: Norman Gatlin M.D.   On: 03/29/2024 01:58        Scheduled Meds:  brimonidine   1 drop Both Eyes BID   Chlorhexidine  Gluconate Cloth  6 each Topical Daily   cloNIDine   0.1 mg Oral Daily   diltiazem   300 mg Oral Daily   enoxaparin  (LOVENOX ) injection  30 mg Subcutaneous Q24H   hydrALAZINE   50 mg Oral Q8H   insulin  aspart  0-9 Units Subcutaneous TID WC   metoCLOPramide   5 mg Oral BID   metoprolol  tartrate  50 mg Oral BID   pantoprazole   40 mg Oral Daily   polyethylene glycol  17 g Oral Daily   potassium chloride   20 mEq Oral Once   rosuvastatin   10 mg Oral Daily   sodium bicarbonate   1,300 mg Oral BID   sodium chloride  flush  3 mL Intravenous Q12H   Continuous Infusions:  sodium chloride  75 mL/hr at 03/29/24 2213   cefTRIAXone  (ROCEPHIN )  IV 2 g (03/29/24 2215)     LOS: 8 days       Anthony CHRISTELLA Pouch, MD [facility] Pager [phone]  If 7PM-7AM, please contact night-coverage [email or web address] 03/30/2024, 8:51 AM

## 2024-03-30 NOTE — Progress Notes (Signed)
 Physical Therapy Treatment Patient Details Name: Stephanie [name] MRN: [id number] DOB: [birth year] Today's Date: 03/30/2024   History of Present Illness Stephanie [name] is an 83 y.o F who comes to [facility] on 03/22/24 for acute onset weakness. Pt admitted with UTI. anemia, asthma, chronic kidney disease, type 2 diabetes mellitus, atrial fibrillation, GERD, glaucoma, hypertension, dyslipidemia, and vitamin B-12 deficiency, right hydronephrosis and right ureteral stent. At baseline pt is a household AMB with RW, requires intermittent minA for bed mobility and low surface transfers from husband.    PT Comments  Patient alert, delayed processing and increased time needed to maximize pt participation and mobility. Overlapped with OT for OOB mobility. Pt required maxA to come up into sitting with hand over hand verbal/tactile cues. Able to sit for several minutes CGA-supervision once positioned in midline. BP in sitting WFLs, pt denied dizziness. Sit <> stand with RW and min-modAx2, but unable to march or step laterally to recliner (difficulty lifting LLE). Stand pivot with handheld assist, min-modAx2. Pt up in chair with OT at bedside. The patient would benefit from further skilled PT intervention to continue to progress towards goals.    If plan is discharge home, recommend the following: A lot of help with walking and/or transfers;Assistance with cooking/housework;Help with stairs or ramp for entrance;Assist for transportation;Direct supervision/assist for financial management;Direct supervision/assist for medications management;Supervision due to cognitive status;A lot of help with bathing/dressing/bathroom   Can travel by private vehicle     No  Equipment Recommendations  Other (comment) (TBD)    Recommendations for Other Services       Precautions / Restrictions Precautions Precautions: Fall Recall of Precautions/Restrictions: Intact Restrictions Weight Bearing Restrictions Per Provider Order: No      Mobility  Bed Mobility Overal bed mobility: Needs Assistance Bed Mobility: Supine to Sit     Supine to sit: Max assist     General bed mobility comments: assistance for BLE movement, hand placement on bed rails, and trunk elevation, steadying    Transfers Overall transfer level: Needs assistance Equipment used: Rolling walker (2 wheels) Transfers: Sit to/from Stand, Bed to chair/wheelchair/BSC Sit to Stand: Min assist, Mod assist, +2 physical assistance Stand pivot transfers: Min assist, Mod assist, +2 physical assistance         General transfer comment: attempted with RW, but pt unable to move LLE to sidestep to recliner. 2 person handheld assistance to stand pivot, min-modAx2    Ambulation/Gait               General Gait Details: deferred due to fatigue, inability to stand and march   Stairs             Wheelchair Mobility     Tilt Bed    Modified Rankin (Stroke Patients Only)       Balance Overall balance assessment: Needs assistance Sitting-balance support: Feet supported, No upper extremity supported, Single extremity supported Sitting balance-Leahy Scale: Fair Sitting balance - Comments: did sit for 5-7 minutes, CGA to supervision Postural control: Posterior lean Standing balance support: Bilateral upper extremity supported Standing balance-Leahy Scale: Poor                              Communication Communication Factors Affecting Communication: Hearing impaired  Cognition Arousal: Alert Behavior During Therapy: Flat affect   PT - Cognitive impairments: Difficult to assess  PT - Cognition Comments: delayed processing, increased time needed. HOH as well Following commands: Intact Following commands impaired: Follows one step commands with increased time    Cueing Cueing Techniques: Verbal cues, Tactile cues  Exercises      General Comments        Pertinent Vitals/Pain Pain  Assessment Pain Assessment: Faces Faces Pain Scale: No hurt    Home Living                          Prior Function            PT Goals (current goals can now be found in the care plan section) Progress towards PT goals: Progressing toward goals    Frequency    Min 2X/week      PT Plan      Co-evaluation PT/OT/SLP Co-Evaluation/Treatment: Yes Reason for Co-Treatment: To address functional/ADL transfers;For patient/therapist safety PT goals addressed during session: Mobility/safety with mobility;Balance;Proper use of DME OT goals addressed during session: ADL's and self-care      AM-PAC PT 6 Clicks Mobility   Outcome Measure  Help needed turning from your back to your side while in a flat bed without using bedrails?: A Lot Help needed moving from lying on your back to sitting on the side of a flat bed without using bedrails?: A Lot Help needed moving to and from a bed to a chair (including a wheelchair)?: A Lot Help needed standing up from a chair using your arms (e.g., wheelchair or bedside chair)?: A Lot Help needed to walk in hospital room?: A Lot Help needed climbing 3-5 steps with a railing? : Total 6 Click Score: 11    End of Session Equipment Utilized During Treatment: Gait belt Activity Tolerance: Patient limited by fatigue Patient left: with call bell/phone within reach;in chair;with family/visitor present;with chair alarm set Nurse Communication: Mobility status PT Visit Diagnosis: Difficulty in walking, not elsewhere classified (R26.2);Other abnormalities of gait and mobility (R26.89);Muscle weakness (generalized) (M62.81)     Time: 1454-1510 PT Time Calculation (min) (ACUTE ONLY): 16 min  Charges:    $Therapeutic Activity: 8-22 mins PT General Charges $$ ACUTE PT VISIT: 1 Visit                     Doyal Shams PT, DPT 3:54 PM,03/30/24

## 2024-03-30 NOTE — Plan of Care (Signed)

## 2024-03-30 NOTE — Progress Notes (Signed)
 Occupational Therapy Treatment Patient Details Name: Stephanie [name] MRN: [id number] DOB: [birth year] Today's Date: 03/30/2024   History of present illness Stephanie [name] is an 83 y.o F who comes to [facility] on 03/22/24 for acute onset weakness. Pt admitted with UTI. anemia, asthma, chronic kidney disease, type 2 diabetes mellitus, atrial fibrillation, GERD, glaucoma, hypertension, dyslipidemia, and vitamin B-12 deficiency, right hydronephrosis and right ureteral stent. At baseline pt is a household AMB with RW, requires intermittent minA for bed mobility and low surface transfers from husband.   OT comments  Pt seen for OT co-tx with PT to optimize safety with ADL mobility. Pt received seated EOB with PT. Flat affect throughout, very HOH despite hearing aides. Pt demonstrates slow processing, problem solving, requiring cues and increased time to initiate and complete tasks. Required +2 assist for STS and SPT from EOB to the recliner today, an increase from previous sessions. Pt and spouse encouraged pt to remain up in recliner for at least 1hr to support overall function. Pt agreeable to attempting. Pt declined ADL participation. Chair repositioning to improve comfort and more neutral cervical positioning. Will continue to progress as able. Care team (RN, MD, TOC, PT aware) notified pt appears to be slurring words (slightly worse than previous session with this therapist and spouse notes that is started once in the hospital) as well as pt drooling mildly with standing/transfers. No focal BUE deficits appreciated. Will continue to assess.       If plan is discharge home, recommend the following:  A lot of help with bathing/dressing/bathroom;Assistance with cooking/housework;Assist for transportation;Direct supervision/assist for medications management;Help with stairs or ramp for entrance;A lot of help with walking and/or transfers   Equipment Recommendations  Other (comment) (defer)       Precautions /  Restrictions Precautions Precautions: Fall Recall of Precautions/Restrictions: Intact Restrictions Weight Bearing Restrictions Per Provider Order: No       Mobility Bed Mobility Overal bed mobility: Needs Assistance Bed Mobility: Supine to Sit     Supine to sit: Max assist     General bed mobility comments: assistance for BLE movement, hand placement on bed rails, and trunk elevation, steadying    Transfers Overall transfer level: Needs assistance Equipment used: Rolling walker (2 wheels), 2 person hand held assist Transfers: Sit to/from Stand, Bed to chair/wheelchair/BSC Sit to Stand: Min assist, Mod assist, +2 physical assistance Stand pivot transfers: Min assist, Mod assist, +2 physical assistance         General transfer comment: attempted with RW, but pt unable to move LLE to sidestep to recliner. 2 person handheld assistance to stand pivot, min-modAx2     Balance Overall balance assessment: Needs assistance Sitting-balance support: Feet supported, No upper extremity supported, Single extremity supported Sitting balance-Leahy Scale: Fair Sitting balance - Comments: did sit for 5-7 minutes, CGA to supervision Postural control: Posterior lean Standing balance support: Bilateral upper extremity supported Standing balance-Leahy Scale: Poor Standing balance comment: reliant on RW in all standing. poor standing tolerance. remains high fall risk             ADL either performed or assessed with clinical judgement   ADL Overall ADL's : Needs assistance/impaired         Functional mobility during ADLs: Moderate assistance;Minimal assistance;+2 for physical assistance       Praxis     Communication Communication Communication: Impaired Factors Affecting Communication: Hearing impaired   Cognition Arousal: Alert Behavior During Therapy: Flat affect  OT - Cognition Comments: Pt grossly alert, slurred speech present, noted to have minor  drooling with standing attempts, slow cognitive processing and difficulty with problem solving                 Following commands: Intact Following commands impaired: Follows one step commands with increased time      Cueing   Cueing Techniques: Verbal cues, Tactile cues             Pertinent Vitals/ Pain       Pain Assessment Pain Assessment: Faces Faces Pain Scale: No hurt   Frequency  Min 2X/week        Progress Toward Goals  OT Goals(current goals can now be found in the care plan section)  Progress towards OT goals: OT to reassess next treatment  Acute Rehab OT Goals Patient Stated Goal: go home OT Goal Formulation: With patient/family Time For Goal Achievement: 04/06/24 Potential to Achieve Goals: Fair  Plan      Co-evaluation    PT/OT/SLP Co-Evaluation/Treatment: Yes Reason for Co-Treatment: To address functional/ADL transfers;For patient/therapist safety PT goals addressed during session: Mobility/safety with mobility;Balance;Proper use of DME OT goals addressed during session: ADL's and self-care      AM-PAC OT 6 Clicks Daily Activity     Outcome Measure   Help from another person eating meals?: A Little Help from another person taking care of personal grooming?: A Little Help from another person toileting, which includes using toliet, bedpan, or urinal?: A Lot Help from another person bathing (including washing, rinsing, drying)?: A Lot Help from another person to put on and taking off regular upper body clothing?: A Lot Help from another person to put on and taking off regular lower body clothing?: A Lot 6 Click Score: 14    End of Session    OT Visit Diagnosis: Other abnormalities of gait and mobility (R26.89);Muscle weakness (generalized) (M62.81);Other symptoms and signs involving cognitive function   Activity Tolerance Patient tolerated treatment well   Patient Left in chair;with call bell/phone within reach;with chair alarm  set;with family/visitor present   Nurse Communication Mobility status;Other (comment) (cognition, drooling - RN, MD, TOC, PT)        Time: 8499-8476 OT Time Calculation (min): 23 min  Charges: OT General Charges $OT Visit: 1 Visit OT Treatments $Therapeutic Activity: 8-22 mins  Warren SAUNDERS., MPH, MS, OTR/L ascom [phone] 03/30/24, 5:11 PM

## 2024-03-30 NOTE — Progress Notes (Addendum)
 [facility] SURGICAL PROGRESS NOTE (cpt [phone])  Hospital Day(s): 8.   Interval History: Patient seen and examined, no acute events or new complaints overnight. Patient reports her biggest complaint is back pain from the bed. No abdominal pain, fever, chills, nausea, emesis. Her distension is improving. Leukocytosis fluctuating but improved this AM; 12.2K. Hgb to 9.2. Renal function elevated; sCr - 4.32; UO - unmeasured. Hypokalemia to 3.3; improved. NGT out yesterday (06/29). CLD; no issues. She continues to have bowel function. Wishes to stop enemas.   Review of Systems:  Constitutional: denies fever, chills  HEENT: denies cough or congestion  Respiratory: denies any shortness of breath  Cardiovascular: denies chest pain or palpitations  Gastrointestinal: denied abdominal pain, N/V Genitourinary: denies burning with urination or urinary frequency Musculoskeletal: denies pain, decreased motor or sensation  Vital signs in last 24 hours: [min-max] current  Temp:  [97.6 F (36.4 C)-97.9 F (36.6 C)] 97.9 F (36.6 C) (06/30 0401) Pulse Rate:  [45-54] 54 (06/30 0401) Resp:  [16-20] 20 (06/30 0401) BP: (119-185)/(43-60) 185/60 (06/30 0401) SpO2:  [96 %-99 %] 96 % (06/30 0401)     Height: 5' 6 (167.6 cm) Weight: 69.4 kg BMI (Calculated): 24.71   Intake/Output last 2 shifts:  06/29 0701 - 06/30 0700 In: 2670.9 [P.O.:60; I.V.:850.9; IV Piggyback:1760] Out: -    Physical Exam:  Constitutional: alert, cooperative and no distress  HENT: normocephalic without obvious abnormality; hard of hearing  Eyes: PERRL, EOM's grossly intact and symmetric  Respiratory: breathing non-labored at rest  Cardiovascular: regular rate and sinus rhythm  Gastrointestinal: soft, she is non-tender, distension seems to have resolved, no rebound/guarding. She is certainly not peritonitic Musculoskeletal: no edema or wounds, motor and sensation grossly intact, NT    Labs:     Latest Ref Rng &  Units 03/30/2024    4:27 AM 03/29/2024    8:53 AM 03/28/2024    4:03 AM  CBC  WBC 4.0 - 10.5 K/uL 12.2  14.0  12.8   Hemoglobin 12.0 - 15.0 g/dL 9.2  9.1  9.0   Hematocrit 36.0 - 46.0 % 30.9  29.9  29.5   Platelets 150 - 400 K/uL 324  428  328       Latest Ref Rng & Units 03/30/2024    4:27 AM 03/29/2024    8:53 AM 03/28/2024    4:03 AM  CMP  Glucose 70 - 99 mg/dL 786  878  885   BUN 8 - 23 mg/dL 72  75  67   Creatinine 0.44 - 1.00 mg/dL 5.67  5.32  5.99   Sodium 135 - 145 mmol/L 136  138  134   Potassium 3.5 - 5.1 mmol/L 3.3  3.0  3.0   Chloride 98 - 111 mmol/L 105  107  105   CO2 22 - 32 mmol/L 16  15  16    Calcium  8.9 - 10.3 mg/dL 8.0  8.0  7.7      Imaging studies: No new pertinent imaging studies    Assessment/Plan: 83 y.o. female admitted with UTI now with clinically improving ileus (? Colonic) vs pSBO vs large bowel obstruction secondary to stool impaction               - Continue CLD for breakfast. If she does well, we can advance to FLD for lunch  - We can discontinue enemas; Continue PO regimen from above  - No need for emergent surgical intervention at this time.   - Monitor  renal function; monitor UO - Monitor abdominal examination; on-going bowel function - Pain control prn; avoid narcotics if feasible   - Antiemetics prn              - Mobilize as tolerated              - Further management per primary service; we will follow   All of the above findings and recommendations were discussed with the patient, patient's family (husband at bedside), and the medical team, and all of their questions were answered to their expressed satisfaction.  -- Arthea Platt, PA-C [facility] 03/30/2024, 7:20 AM M-F: 7am - 4pm

## 2024-03-31 ENCOUNTER — Inpatient Hospital Stay

## 2024-03-31 ENCOUNTER — Inpatient Hospital Stay: Attending: Internal Medicine

## 2024-03-31 DIAGNOSIS — N390 Urinary tract infection, site not specified: Secondary | ICD-10-CM | POA: Diagnosis not present

## 2024-03-31 DIAGNOSIS — K56609 Unspecified intestinal obstruction, unspecified as to partial versus complete obstruction: Secondary | ICD-10-CM | POA: Diagnosis not present

## 2024-03-31 DIAGNOSIS — A419 Sepsis, unspecified organism: Secondary | ICD-10-CM | POA: Diagnosis not present

## 2024-03-31 LAB — CBC
HCT: 27 % — ABNORMAL LOW (ref 36.0–46.0)
Hemoglobin: 8.5 g/dL — ABNORMAL LOW (ref 12.0–15.0)
MCH: 29.1 pg (ref 26.0–34.0)
MCHC: 31.5 g/dL (ref 30.0–36.0)
MCV: 92.5 fL (ref 80.0–100.0)
Platelets: 274 10*3/uL (ref 150–400)
RBC: 2.92 MIL/uL — ABNORMAL LOW (ref 3.87–5.11)
RDW: 14.9 % (ref 11.5–15.5)
WBC: 11.9 10*3/uL — ABNORMAL HIGH (ref 4.0–10.5)
nRBC: 0 % (ref 0.0–0.2)

## 2024-03-31 LAB — GLUCOSE, CAPILLARY
Glucose-Capillary: 178 mg/dL — ABNORMAL HIGH (ref 70–99)
Glucose-Capillary: 221 mg/dL — ABNORMAL HIGH (ref 70–99)
Glucose-Capillary: 262 mg/dL — ABNORMAL HIGH (ref 70–99)
Glucose-Capillary: 231 mg/dL — ABNORMAL HIGH (ref 70–99)

## 2024-03-31 LAB — BASIC METABOLIC PANEL WITH GFR
Anion gap: 9 (ref 5–15)
BUN: 74 mg/dL — ABNORMAL HIGH (ref 8–23)
CO2: 20 mmol/L — ABNORMAL LOW (ref 22–32)
Calcium: 7.7 mg/dL — ABNORMAL LOW (ref 8.9–10.3)
Chloride: 107 mmol/L (ref 98–111)
Creatinine, Ser: 4.11 mg/dL — ABNORMAL HIGH (ref 0.44–1.00)
GFR, Estimated: 10 mL/min — ABNORMAL LOW (ref >60)
Glucose, Bld: 199 mg/dL — ABNORMAL HIGH (ref 70–99)
Potassium: 3.6 mmol/L (ref 3.5–5.1)
Sodium: 136 mmol/L (ref 135–145)

## 2024-03-31 MED ORDER — FLEET ENEMA RE ENEM
1.0000 | ENEMA | Freq: Every day | RECTAL | Status: DC
  Administered 2024-03-31 – 2024-04-01 (×2): 1 via RECTAL

## 2024-03-31 NOTE — NC FL2 (Signed)
 [place]  MEDICAID [id number] LEVEL OF CARE FORM     IDENTIFICATION  Patient Name: Stephanie [name] Birthdate: 04-01-1941 Sex: female Admission Date (Current Location): 03/22/2024  [facility] and [place] Number:  Chiropodist and Address:  [facility], [address], [place], [place] [address]      Provider Number: [id number]  Attending Physician Name and Address:  Trudy Anthony HERO, MD  Relative Name and Phone Number:       Current Level of Care: Hospital Recommended Level of Care: Skilled Nursing Facility Prior Approval Number:    Date Approved/Denied:   PASRR Number: [id number]  Discharge Plan: SNF    Current Diagnoses: Patient Active Problem List   Diagnosis Date Noted   Anemia due to stage 4 chronic kidney disease (HCC) 05/28/2023   Fever    Decubitus ulcer of coccyx, unspecified pressure ulcer stage 04/29/2022   Infected kidney cyst 04/28/2022   Acute unilateral obstructive uropathy 04/28/2022   Acute renal failure superimposed on stage 3b chronic kidney disease (HCC) 04/28/2022   Dyslipidemia 04/28/2022   GERD without esophagitis 04/28/2022   Hypertensive urgency 04/28/2022   Hypokalemia 03/12/2022   Sepsis secondary to UTI (HCC) 03/09/2022   Indwelling Foley catheter present 03/09/2022   Hyperkalemia 07/25/2021   Hydronephrosis of right kidney 07/25/2021   Type II diabetes mellitus with renal manifestations (HCC)    Symptomatic anemia    Asthma    Acute renal failure superimposed on stage 3a chronic kidney disease (HCC)    UTI (urinary tract infection)    Abnormal computed tomography of large intestine    Polyp of descending colon    S/p exchange of ureteral stent 02/06/22 03/22/2021   Aortic atherosclerosis (HCC) 02/23/2021   CKD (chronic kidney disease) stage 4, GFR 15-29 ml/min (HCC) 02/14/2021   Proteinuria 08/18/2020   UPJ obstruction, acquired 08/18/2020   Anemia secondary to renal failure 07/20/2020   CKD (chronic kidney  disease) stage 5, GFR less than 15 ml/min (HCC)    Ileus (HCC)    Paroxysmal atrial fibrillation (HCC)    Goals of care, counseling/discussion    Palliative care by specialist    SBO (small bowel obstruction) (HCC)    Pressure injury of skin 06/19/2020   Acquired thrombophilia (HCC) 06/19/2020   On continuous oral anticoagulation 06/19/2020   Acute pyelonephritis 06/18/2020   Hydronephrosis, right 06/18/2020   Lactic acid acidosis 06/18/2020   Hyperglycemia 06/18/2020   Hx of essential hypertension 06/18/2020   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   Atrial fibrillation with rapid ventricular response (HCC)    Essential hypertension    Pure hypercholesterolemia    Diabetes mellitus (HCC) 06/17/2020   Rheumatoid arthritis (HCC) 06/17/2020   COVID-19 virus infection 06/17/2020   AKI (acute kidney injury) (HCC) 06/17/2020   Chronic left SI joint pain 04/19/2020   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T9-Sacrum) 04/19/2020   Chronic radicular lumbar pain 04/19/2020   Lumbar spondylosis 04/19/2020   Chronic pain syndrome 04/19/2020   Sepsis (HCC) 07/05/2019   CAP (community acquired pneumonia) 07/05/2019   Gastroenteritis 07/05/2019   DM type 2 with diabetic mixed hyperlipidemia (HCC) 04/30/2018   Elevated serum creatinine 10/16/2017   Rheumatoid arthritis involving multiple sites with positive rheumatoid factor (HCC) 08/13/2016   Hyperlipidemia, mixed 04/18/2016   Gross hematuria 11/01/2015   History of colon cancer 10/18/2014   Squamous cell metaplasia of urinary bladder 04/29/2014   Osteoarthritis of knee 04/01/2014   B-complex deficiency 03/03/2014  Benign essential hypertension 01/16/2014   Unspecified thoracic, thoracolumbar and lumbosacral intervertebral disc disorder 01/16/2014   Malignant neoplasm of colon (HCC) 01/16/2014   Bladder neoplasm of uncertain malignant potential 09/14/2013   Nocturia 08/05/2013   Idiopathic scoliosis and kyphoscoliosis  06/24/2013   Chronic cystitis 05/04/2013   Increased frequency of urination 05/04/2013   Incomplete emptying of bladder 05/04/2013    Orientation RESPIRATION BLADDER Height & Weight     Self, Time, Situation, Place  Normal Continent, Indwelling catheter Weight: 153 lb (69.4 kg) Height:  5' 6 (167.6 cm)  BEHAVIORAL SYMPTOMS/MOOD NEUROLOGICAL BOWEL NUTRITION STATUS   (None)  (None) Incontinent Diet (Carb modified)  AMBULATORY STATUS COMMUNICATION OF NEEDS Skin   Extensive Assist Verbally Bruising                       Personal Care Assistance Level of Assistance  Bathing, Feeding, Dressing Bathing Assistance: Maximum assistance Feeding assistance: Limited assistance Dressing Assistance: Maximum assistance     Functional Limitations Info  Sight, Hearing, Speech Sight Info: Adequate Hearing Info: Impaired Speech Info: Adequate    SPECIAL CARE FACTORS FREQUENCY  PT (By licensed PT), OT (By licensed OT)     PT Frequency: 5 x week OT Frequency: 5 x week            Contractures Contractures Info: Not present    Additional Factors Info  Code Status, Allergies, Isolation Precautions Code Status Info: Full code Allergies Info: Remicade (Infliximab), Ibuprofen, Indomethacin, Methotrexate Derivatives, Moexipril, Percocet (Oxycodone -acetaminophen ), Naprosyn (Naproxen)     Isolation Precautions Info: Contact precautions: ESBL     Current Medications (03/31/2024):  This is the current hospital active medication list Current Facility-Administered Medications  Medication Dose Route Frequency Provider Last Rate Last Admin   0.9 %  sodium chloride  infusion   Intravenous Continuous Trudy Anthony HERO, MD 75 mL/hr at 03/31/24 1203 New Bag at 03/31/24 1203   acetaminophen  (TYLENOL ) tablet 650 mg  650 mg Oral Q6H PRN Barbarann Nest, MD   650 mg at 03/30/24 1853   Or   acetaminophen  (TYLENOL ) suppository 650 mg  650 mg Rectal Q6H PRN Barbarann Nest, MD       albuterol   (PROVENTIL ) (2.5 MG/3ML) 0.083% nebulizer solution 3 mL  3 mL Nebulization Q6H PRN Barbarann Nest, MD       bisacodyl  (DULCOLAX) EC tablet 5 mg  5 mg Oral Daily PRN Barbarann Nest, MD       brimonidine  (ALPHAGAN ) 0.15 % ophthalmic solution 1 drop  1 drop Both Eyes BID Barbarann Nest, MD   1 drop at 03/31/24 0935   cefTRIAXone  (ROCEPHIN ) 2 g in sodium chloride  0.9 % 100 mL IVPB  2 g Intravenous Q24H Epifanio Alm SQUIBB, MD   Stopped at 03/30/24 2243   Chlorhexidine  Gluconate Cloth 2 % PADS 6 each  6 each Topical Daily Elpidio Reyes DEL, MD   6 each at 03/31/24 0935   cloNIDine  (CATAPRES ) tablet 0.1 mg  0.1 mg Oral Daily Elpidio Reyes DEL, MD   0.1 mg at 03/31/24 9065   diltiazem  (CARDIZEM  CD) 24 hr capsule 300 mg  300 mg Oral Daily Elpidio Reyes DEL, MD   300 mg at 03/31/24 9065   docusate sodium  (COLACE) capsule 100 mg  100 mg Oral BID PRN Trudy Anthony HERO, MD       enoxaparin  (LOVENOX ) injection 30 mg  30 mg Subcutaneous Q24H Barbarann Nest, MD   30 mg at 03/30/24 2214   hydrALAZINE  (APRESOLINE )  injection 20 mg  20 mg Intravenous Q4H PRN Trudy Anthony HERO, MD       hydrALAZINE  (APRESOLINE ) tablet 50 mg  50 mg Oral Q8H Yates, Jennifer, MD   50 mg at 03/31/24 9485   insulin  aspart (novoLOG ) injection 0-9 Units  0-9 Units Subcutaneous TID WC Barbarann Nest, MD   3 Units at 03/31/24 1225   metoCLOPramide  (REGLAN ) tablet 5 mg  5 mg Oral BID Barbarann Nest, MD   5 mg at 03/31/24 0934   metoprolol  tartrate (LOPRESSOR ) tablet 50 mg  50 mg Oral BID Barbarann Nest, MD   50 mg at 03/31/24 0933   ondansetron  (ZOFRAN ) tablet 4 mg  4 mg Oral Q6H PRN Barbarann Nest, MD   4 mg at 03/26/24 1059   Or   ondansetron  (ZOFRAN ) injection 4 mg  4 mg Intravenous Q6H PRN Barbarann Nest, MD   4 mg at 03/26/24 0526   pantoprazole  (PROTONIX ) EC tablet 40 mg  40 mg Oral Daily Barbarann Nest, MD   40 mg at 03/31/24 0934   phenol (CHLORASEPTIC) mouth spray 1 spray  1 spray Mouth/Throat PRN Trudy Anthony HERO, MD    1 spray at 03/29/24 0501   polyethylene glycol (MIRALAX  / GLYCOLAX ) packet 17 g  17 g Oral Daily Elpidio Reyes DEL, MD   17 g at 03/31/24 9066   rosuvastatin  (CRESTOR ) tablet 10 mg  10 mg Oral Daily Barbarann Nest, MD   10 mg at 03/31/24 0934   sodium bicarbonate  tablet 1,300 mg  1,300 mg Oral BID Trudy Anthony HERO, MD   1,300 mg at 03/31/24 9066   sodium chloride  flush (NS) 0.9 % injection 3 mL  3 mL Intravenous Q12H Yates, Jennifer, MD   3 mL at 03/31/24 0935   sodium phosphate  (FLEET) enema 1 enema  1 enema Rectal Daily Schulz, Zachary R, PA-C         Discharge Medications: Please see discharge summary for a list of discharge medications.  Relevant Imaging Results:  Relevant Lab Results:   Additional Information SS#: [id number]  Lauraine JAYSON Carpen, LCSW

## 2024-03-31 NOTE — TOC Progression Note (Signed)
 Transition of Care [facility]) - Progression Note    Patient Details  Name: Stephanie [name] MRN: [id number] Date of Birth: March 14, 1941  Transition of [facility] CM/SW Contact  Lauraine JAYSON Carpen, LCSW Phone Number: 03/31/2024, 1:15 PM  Clinical Narrative:  Husband wants to see if [facility] will reconsider since her IV abx are ending tomorrow. Admissions coordinator will ask their director of nursing. Second preference is [facility] but they have declined. [facility] is third preference. Left message for admissions coordinator to see if they have had any rehab beds open up.   Expected Discharge Plan: Skilled Nursing Facility Barriers to Discharge: Continued Medical Work up  Expected Discharge Plan and Services     Post Acute Care Choice: Skilled Nursing Facility Living arrangements for the past 2 months: Single Family Home                                       Social Determinants of Health (SDOH) Interventions SDOH Screenings   Food Insecurity: No Food Insecurity (03/22/2024)  Housing: High Risk (03/22/2024)  Transportation Needs: No Transportation Needs (03/22/2024)  Utilities: Not At Risk (03/22/2024)  Depression (PHQ2-9): Low Risk  (03/27/2023)  Financial Resource Strain: Low Risk  (09/05/2023)   Received from [facility]  Social Connections: Patient Declined (03/22/2024)  Tobacco Use: Low Risk  (03/22/2024)  Recent Concern: Tobacco Use - Medium Risk (02/26/2024)   Received from [facility]    Readmission Risk Interventions    03/24/2024    4:33 PM 05/02/2022   10:33 AM 04/29/2022    9:58 AM  Readmission Risk Prevention Plan  Transportation Screening   Complete  PCP or Specialist Appt within 3-5 Days Complete  Complete  HRI or Home Care Consult   Complete  Social Work Consult for Recovery Care Planning/Counseling Complete  Complete  Palliative Care Screening Not Applicable Complete Not Applicable  Medication Review Ocean[employer])    Complete

## 2024-03-31 NOTE — Progress Notes (Signed)
 03/31/2024  Subjective: Patient had head CT last night due to concerns for altered mental status.  Did not find any pathology.  This morning, the patient reports that she tolerated a full liquid diet, but denies any more flatus or a bowel movement.    Vital signs: Temp:  [96.5 F (35.8 C)-97.7 F (36.5 C)] 97.7 F (36.5 C) (07/01 0204) Pulse Rate:  [52-61] 52 (07/01 0204) Resp:  [15-17] 17 (07/01 0204) BP: (123-146)/(47-62) 145/62 (07/01 0204) SpO2:  [96 %-97 %] 96 % (07/01 0204)   Intake/Output: 06/30 0701 - 07/01 0700 In: 60 [P.O.:60] Out: 600 [Urine:600] Last BM Date : 03/28/24 (brown liquid from enema)  Physical Exam: Constitutional:  No acute distress Abdomen:  soft, still a bit distended, with mild discomfort in the LLQ which was not present yesterday on exam.  Labs:  Recent Labs    03/30/24 0427 03/31/24 0404  WBC 12.2* 11.9*  HGB 9.2* 8.5*  HCT 30.9* 27.0*  PLT 324 274   Recent Labs    03/30/24 0427 03/31/24 0404  NA 136 136  K 3.3* 3.6  CL 105 107  CO2 16* 20*  GLUCOSE 213* 199*  BUN 72* 74*  CREATININE 4.32* 4.11*  CALCIUM  8.0* 7.7*   No results for input(s): LABPROT, INR in the last 72 hours.  Imaging: CT HEAD WO CONTRAST ( ) Result Date: 03/30/2024 CLINICAL DATA:  Mental status change, unknown cause EXAM: CT HEAD WITHOUT CONTRAST TECHNIQUE: Contiguous axial images were obtained from the base of the skull through the vertex without intravenous contrast. RADIATION DOSE REDUCTION: This exam was performed according to the departmental dose-optimization program which includes automated exposure control, adjustment of the mA and/or kV according to patient size and/or use of iterative reconstruction technique. COMPARISON:  CT head March 28, 2018. FINDINGS: Brain: Remote bilateral cerebellar infarcts. Remote appearing left frontal and left parieto-occipital infarcts. No evidence of acute large vascular territory infarct, acute hemorrhage, mass lesion,  midline shift or hydrocephalus. Cerebral atrophy. Vascular: Calcific atherosclerosis. No hyperdense vessel identified. Skull: No acute fracture. Sinuses/Orbits: Clear sinuses.  No acute orbital findings. Other: Small left mastoid effusion. IMPRESSION: 1. No evidence of acute intracranial abnormality. 2. Remote appearing left frontal, left parieto-occipital, and bilateral cerebellar infarcts. MRI could provide more sensitive evaluation for acute infarct if clinically warranted. Electronically Signed   By: Gilmore GORMAN Molt M.D.   On: 03/30/2024 23:00    Assessment/Plan: This is a 83 y.o. female with ileus vs SBO or fecal impaction.  --Will obtain KUB this morning to re-evaluate the patient's distention/bowels.  She had been doing well with enemas and were discontinued yesterday, but no further BM yesterday.  May need to resume depending on KUB findings. --If KUB without signs of distention, would advance to soft diet and continue po bowel regimen.   I spent 35 minutes dedicated to the care of this patient on the date of this encounter to include pre-visit review of records, face-to-face time with the patient discussing diagnosis and management, and any post-visit coordination of care.  Aloysius Sheree Plant, MD [facility]

## 2024-03-31 NOTE — Progress Notes (Signed)
 PROGRESS NOTE   HPI was taken from Dr. Barbarann: Stephanie [name] is a 83 y.o. female with medical history significant of stage 4 CKD with anemia, afib not on AC, chronic urinary retention with foley, remote colon CA, DM, HLD, HTN, RA, and SBO who presented on 6/22 with generalized weakness. She reports that she came in because they made me.  She was so weak that she couldn't get from the bed to the couch.  She hardly ate at all yesterday.  She has been complaining that I feel terrible.  No obvious urinary symptoms.  No fever.  She has been tired all the time with poor appetite for several weeks but it was clearly worse today.     ER Course:  Sepsis from UTI.  h/o ESBL UTI, chronic retention with foley, UPJ stent.  Generalized weakness, vomiting.  WBC 18, lactate 2.  Urine looks purulent, foley exchanged.  DVT US  negative.  CT with stent in place, improving hydro, no obstructive uropathy.  Started on carbapenem due to AKI on CKD.    Stephanie [name]  FMW:[id number] DOB: May 13, 1941 DOA: 03/22/2024 PCP: Cleotilde Oneil FALCON, MD   Assessment & Plan:   Principal Problem:   Sepsis secondary to UTI [facility]) Active Problems:   DM type 2 with diabetic mixed hyperlipidemia (HCC)   Rheumatoid arthritis involving multiple sites with positive rheumatoid factor (HCC)   Pure hypercholesterolemia   CKD (chronic kidney disease) stage 5, GFR less than 15 ml/min (HCC)   UPJ obstruction, acquired   Type II diabetes mellitus with renal manifestations (HCC)   Anemia due to stage 4 chronic kidney disease (HCC)  Assessment and Plan: Sepsis due to UTI: urine cx growing e. coli. Foley exchanged in the ER. Likely secondary to chronic foley. Continue on IV rocephin  x 10 days as per ID. ID following and recs apprec    Staph bacteremia: staph warneri, etiology unclear. Likely containment. Repeat blood cxs NGTD.    H/o urinary retention and hydronephrosis: foley exchanged in the ER. S/p stent. Mild right hydronephrosis as per  renal US . May need to have a stent replaced vs having stent replaced if another infection occurs   Partial SBO: as per XR. Resolving. NG tube removed. Will get an enema today as pt still has abd distention as per gen surg. Continue on full liquid diet as per gen surg. 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 Continue on IVFs  Hypokalemia: WNL today   ACD: likely secondary to CKD. No need for a transfusion currently   DM2: fair control, HbA1c 7.2. Continue on SSI w/ accucheck    Metabolic acidosis: trending up. Continue on bicarb. Likely secondary to CKD    HLD: continue on statin    HTN: continue on home dose of metoprolol , diltiazem , hydralazine  & clonidine  (started this admission). Will need to check orthostatic vital signs as significant concern for orthostatic hypotension after a syncopal event when working with therapy 03/25/24.   RA: holding home dose of leflunomide  w/ active infection    COPD: w/o exacerbation. Continue on bronchodilators   Glaucoma: continue on home dose of brimonidine           DVT prophylaxis: lovenox   Code Status: full  Family Communication: discussed pt's care w/ pt's family at bedside and answered their questions  Disposition Plan: likely d/c to SNF  Level of care: Telemetry Medical  Status is: Inpatient Remains  inpatient appropriate because: severity of illness. Still has abd distention, needs enema as per gen surg. Needs SNF placement too     Consultants:  ID Gen surg   Procedures:  Antimicrobials: rocephin     Subjective: Pt c/o malaise   Objective: Vitals:   03/30/24 1830 03/30/24 2007 03/31/24 0204 03/31/24 0803  BP:  (!) 146/52 (!) 145/62 (!) 150/58  Pulse:  61 (!) 52 (!) 56  Resp:  17 17 20   Temp: (!) 97.4 F (36.3 C) 97.7 F (36.5 C) 97.7 F (36.5 C) 98 F (36.7 C)  TempSrc: Oral Oral Oral   SpO2:  96% 96% 95%  Weight:      Height:         Intake/Output Summary (Last 24 hours) at 03/31/2024 0910 Last data filed at 03/30/2024 2213 Gross per 24 hour  Intake 60 ml  Output --  Net 60 ml   Filed Weights   03/22/24 1000 03/22/24 1414  Weight: 69.4 kg 69.4 kg    Examination:  General exam: appears comfortable. Hard of hearing  Respiratory system: clear breath sounds b/l  Cardiovascular system: S1/S2+. No rubs or clicks  Gastrointestinal system: abd is soft, NT, distended, hypoactive bowel sounds Central nervous system:  alert & oriented. Moves all extremities  Psychiatry:  judgement and insight appears at baseline. Flat mood and affect   Data Reviewed: I have personally reviewed following labs and imaging studies  CBC: Recent Labs  Lab 03/27/24 0439 03/28/24 0403 03/29/24 0853 03/30/24 0427 03/31/24 0404  WBC 13.4* 12.8* 14.0* 12.2* 11.9*  HGB 9.0* 9.0* 9.1* 9.2* 8.5*  HCT 29.3* 29.5* 29.9* 30.9* 27.0*  MCV 93.3 94.6 96.1 96.3 92.5  PLT 291 328 428* 324 274   Basic Metabolic Panel: Recent Labs  Lab 03/27/24 0439 03/28/24 0403 03/29/24 0853 03/30/24 0427 03/31/24 0404  NA 135 134* 138 136 136  K 3.2* 3.0* 3.0* 3.3* 3.6  CL 102 105 107 105 107  CO2 21* 16* 15* 16* 20*  GLUCOSE 145* 114* 121* 213* 199*  BUN 61* 67* 75* 72* 74*  CREATININE 3.91* 4.00* 4.67* 4.32* 4.11*  CALCIUM  8.0* 7.7* 8.0* 8.0* 7.7*  MG 2.7* 2.7*  --   --   --    GFR: Estimated Creatinine Clearance: 10 mL/min (A) (by C-G formula based on SCr of 4.11 mg/dL (H)). Liver Function Tests: No results for input(s): AST, ALT, ALKPHOS, BILITOT, PROT, ALBUMIN  in the last 168 hours.  No results for input(s): LIPASE, AMYLASE in the last 168 hours.  No results for input(s): AMMONIA in the last 168 hours. Coagulation Profile: No results for input(s): INR, PROTIME in the last 168 hours. Cardiac Enzymes: No results for input(s): CKTOTAL, CKMB, CKMBINDEX, TROPONINI in the last 168 hours. BNP (last 3  results) No results for input(s): PROBNP in the last 8760 hours. HbA1C: No results for input(s): HGBA1C in the last 72 hours. CBG: Recent Labs  Lab 03/30/24 0939 03/30/24 1151 03/30/24 1615 03/30/24 2121 03/31/24 0804  GLUCAP 194* 319* 341* 291* 178*   Lipid Profile: No results for input(s): CHOL, HDL, LDLCALC, TRIG, CHOLHDL, LDLDIRECT in the last 72 hours. Thyroid Function Tests: No results for input(s): TSH, T4TOTAL, FREET4, T3FREE, THYROIDAB in the last 72 hours. Anemia Panel: No results for input(s): VITAMINB12, FOLATE, FERRITIN, TIBC, IRON , RETICCTPCT in the last 72 hours. Sepsis Labs: No results for input(s): PROCALCITON, LATICACIDVEN in the last 168 hours.   Recent Results (from the past 240 hours)  Resp panel by RT-PCR (  RSV, Flu A&B, Covid) Anterior Nasal Swab     Status: None   Collection Time: 03/22/24  8:50 AM   Specimen: Anterior Nasal Swab  Result Value Ref Range Status   SARS Coronavirus 2 by RT PCR NEGATIVE NEGATIVE Final    Comment: (NOTE) SARS-CoV-2 target nucleic acids are NOT DETECTED.  The SARS-CoV-2 RNA is generally detectable in upper respiratory specimens during the acute phase of infection. The lowest concentration of SARS-CoV-2 viral copies this assay can detect is 138 copies/mL. A negative result does not preclude SARS-Cov-2 infection and should not be used as the sole basis for treatment or other patient management decisions. A negative result may occur with  improper specimen collection/handling, submission of specimen other than nasopharyngeal swab, presence of viral mutation(s) within the areas targeted by this assay, and inadequate number of viral copies(<138 copies/mL). A negative result must be combined with clinical observations, patient history, and epidemiological information. The expected result is Negative.  Fact Sheet for Patients:  BloggerCourse.com  Fact Sheet  for Healthcare Providers:  SeriousBroker.it  This test is no t yet approved or cleared by the [place]  FDA and  has been authorized for detection and/or diagnosis of SARS-CoV-2 by FDA under an Emergency Use Authorization (EUA). This EUA will remain  in effect (meaning this test can be used) for the duration of the COVID-19 declaration under Section 564(b)(1) of the Act, 21 U.S.C.section 360bbb-3(b)(1), unless the authorization is terminated  or revoked sooner.       Influenza A by PCR NEGATIVE NEGATIVE Final   Influenza B by PCR NEGATIVE NEGATIVE Final    Comment: (NOTE) The Xpert Xpress SARS-CoV-2/FLU/RSV plus assay is intended as an aid in the diagnosis of influenza from Nasopharyngeal swab specimens and should not be used as a sole basis for treatment. Nasal washings and aspirates are unacceptable for Xpert Xpress SARS-CoV-2/FLU/RSV testing.  Fact Sheet for Patients: BloggerCourse.com  Fact Sheet for Healthcare Providers: SeriousBroker.it  This test is not yet approved or cleared by the [place]  FDA and has been authorized for detection and/or diagnosis of SARS-CoV-2 by FDA under an Emergency Use Authorization (EUA). This EUA will remain in effect (meaning this test can be used) for the duration of the COVID-19 declaration under Section 564(b)(1) of the Act, 21 U.S.C. section 360bbb-3(b)(1), unless the authorization is terminated or revoked.     Resp Syncytial Virus by PCR NEGATIVE NEGATIVE Final    Comment: (NOTE) Fact Sheet for Patients: BloggerCourse.com  Fact Sheet for Healthcare Providers: SeriousBroker.it  This test is not yet approved or cleared by the [place]  FDA and has been authorized for detection and/or diagnosis of SARS-CoV-2 by FDA under an Emergency Use Authorization (EUA). This EUA will remain in effect (meaning  this test can be used) for the duration of the COVID-19 declaration under Section 564(b)(1) of the Act, 21 U.S.C. section 360bbb-3(b)(1), unless the authorization is terminated or revoked.  Performed at [facility], [address], [place], [place] [address]   Urine Culture (for pregnant, neutropenic or urologic patients or patients with an indwelling urinary catheter)     Status: Abnormal   Collection Time: 03/22/24  8:50 AM   Specimen: Urine, Catheterized  Result Value Ref Range Status   Specimen Description   Final    URINE, CATHETERIZED Performed at [facility], [address] [place], [place] [address]    Special Requests   Final    NONE Performed at [facility], [address] [place], [place] [address]    Culture >=100,000 COLONIES/mL ESCHERICHIA COLI (A)  Final   Report Status 03/24/2024 FINAL  Final   Organism ID, Bacteria ESCHERICHIA COLI (A)  Final      Susceptibility   Escherichia coli - MIC*    AMPICILLIN >=32 RESISTANT Resistant     CEFAZOLIN  RESISTANT Resistant     CEFEPIME  <=0.12 SENSITIVE Sensitive     CEFTRIAXONE  <=0.25 SENSITIVE Sensitive     CIPROFLOXACIN  >=4 RESISTANT Resistant     GENTAMICIN <=1 SENSITIVE Sensitive     IMIPENEM <=0.25 SENSITIVE Sensitive     NITROFURANTOIN  <=16 SENSITIVE Sensitive     TRIMETH /SULFA  >=320 RESISTANT Resistant     AMPICILLIN/SULBACTAM >=32 RESISTANT Resistant     PIP/TAZO 16 SENSITIVE Sensitive ug/mL    * >=100,000 COLONIES/mL ESCHERICHIA COLI  Blood culture (routine x 2)     Status: Abnormal   Collection Time: 03/22/24  9:38 AM   Specimen: BLOOD  Result Value Ref Range Status   Specimen Description   Final    BLOOD LEFT ANTECUBITAL Performed at [facility], [address] [place], [place] [address]    Special Requests   Final    BOTTLES DRAWN AEROBIC AND ANAEROBIC Blood Culture results may not be optimal due to an inadequate volume of blood received in culture  bottles Performed at [facility], [address] [place], [place] [address]    Culture  Setup Time   Final    GRAM POSITIVE COCCI AEROBIC BOTTLE ONLY CRITICAL RESULT CALLED TO, READ BACK BY AND VERIFIED WITH: JASON ROBBINS 03/23/24 0445 MW    Culture (A)  Final    STAPHYLOCOCCUS WARNERI THE SIGNIFICANCE OF ISOLATING THIS ORGANISM FROM A SINGLE SET OF BLOOD CULTURES WHEN MULTIPLE SETS ARE DRAWN IS UNCERTAIN. PLEASE NOTIFY THE MICROBIOLOGY DEPARTMENT WITHIN ONE WEEK IF SPECIATION AND SENSITIVITIES ARE REQUIRED. Performed at [facility], [address] [place], [place] [address]    Report Status 03/25/2024 FINAL  Final  Blood culture (routine x 2)     Status: None   Collection Time: 03/22/24  9:38 AM   Specimen: BLOOD  Result Value Ref Range Status   Specimen Description BLOOD BLOOD LEFT ARM  Final   Special Requests   Final    BOTTLES DRAWN AEROBIC AND ANAEROBIC Blood Culture adequate volume   Culture   Final    NO GROWTH 5 DAYS Performed at [facility], [address] [place], [place] [address]    Report Status 03/27/2024 FINAL  Final  Blood Culture ID Panel (Reflexed)     Status: Abnormal   Collection Time: 03/22/24  9:38 AM  Result Value Ref Range Status   Enterococcus faecalis NOT DETECTED NOT DETECTED Final   Enterococcus Faecium NOT DETECTED NOT DETECTED Final   Listeria monocytogenes NOT DETECTED NOT DETECTED Final   Staphylococcus species DETECTED (A) NOT DETECTED Final    Comment: CRITICAL RESULT CALLED TO, READ BACK BY AND VERIFIED WITH: JASON ROBBINS 03/23/24 0445 MW    Staphylococcus aureus (BCID) NOT DETECTED NOT DETECTED Final   Staphylococcus epidermidis NOT DETECTED NOT DETECTED Final   Staphylococcus lugdunensis NOT DETECTED NOT DETECTED Final   Streptococcus species NOT DETECTED NOT DETECTED Final   Streptococcus agalactiae NOT DETECTED NOT DETECTED Final   Streptococcus pneumoniae NOT DETECTED NOT DETECTED Final   Streptococcus  pyogenes NOT DETECTED NOT DETECTED Final   A.calcoaceticus-baumannii NOT DETECTED NOT DETECTED Final   Bacteroides fragilis NOT DETECTED NOT DETECTED  Final   Enterobacterales NOT DETECTED NOT DETECTED Final   Enterobacter cloacae complex NOT DETECTED NOT DETECTED Final   Escherichia coli NOT DETECTED NOT DETECTED Final   Klebsiella aerogenes NOT DETECTED NOT DETECTED Final   Klebsiella oxytoca NOT DETECTED NOT DETECTED Final   Klebsiella pneumoniae NOT DETECTED NOT DETECTED Final   Proteus species NOT DETECTED NOT DETECTED Final   Salmonella species NOT DETECTED NOT DETECTED Final   Serratia marcescens NOT DETECTED NOT DETECTED Final   Haemophilus influenzae NOT DETECTED NOT DETECTED Final   Neisseria meningitidis NOT DETECTED NOT DETECTED Final   Pseudomonas aeruginosa NOT DETECTED NOT DETECTED Final   Stenotrophomonas maltophilia NOT DETECTED NOT DETECTED Final   Candida albicans NOT DETECTED NOT DETECTED Final   Candida auris NOT DETECTED NOT DETECTED Final   Candida glabrata NOT DETECTED NOT DETECTED Final   Candida krusei NOT DETECTED NOT DETECTED Final   Candida parapsilosis NOT DETECTED NOT DETECTED Final   Candida tropicalis NOT DETECTED NOT DETECTED Final   Cryptococcus neoformans/gattii NOT DETECTED NOT DETECTED Final    Comment: Performed at [facility], [address] [place], [place] [address]  MRSA Next Gen by PCR, Nasal     Status: None   Collection Time: 03/23/24  9:20 AM   Specimen: Nasal Mucosa; Nasal Swab  Result Value Ref Range Status   MRSA by PCR Next Gen NOT DETECTED NOT DETECTED Final    Comment: (NOTE) The GeneXpert MRSA Assay (FDA approved for NASAL specimens only), is one component of a comprehensive MRSA colonization surveillance program. It is not intended to diagnose MRSA infection nor to guide or monitor treatment for MRSA infections. Test performance is not FDA approved in patients less than 62 years old. Performed at [facility], [address] [place], [place] [address]   Culture, blood (single) w Reflex to ID Panel     Status: None   Collection Time: 03/23/24  4:45 PM   Specimen: BLOOD  Result Value Ref Range Status   Specimen Description BLOOD BLOOD RIGHT HAND  Final   Special Requests   Final    BOTTLES DRAWN AEROBIC AND ANAEROBIC Blood Culture adequate volume   Culture   Final    NO GROWTH 5 DAYS Performed at [facility], [address] [place], [place] [address]    Report Status 03/28/2024 FINAL  Final  Culture, blood (Routine X 2) w Reflex to ID Panel     Status: None   Collection Time: 03/25/24 12:49 PM   Specimen: BLOOD  Result Value Ref Range Status   Specimen Description BLOOD BLOOD LEFT HAND  Final   Special Requests   Final    BOTTLES DRAWN AEROBIC AND ANAEROBIC Blood Culture adequate volume   Culture   Final    NO GROWTH 5 DAYS Performed at [facility], [address] [place], [place] [address]    Report Status 03/30/2024 FINAL  Final  Culture, blood (Routine X 2) w Reflex to ID Panel     Status: None   Collection Time: 03/25/24 12:50 PM   Specimen: BLOOD  Result Value Ref Range Status   Specimen Description BLOOD BLOOD RIGHT HAND  Final   Special Requests   Final    BOTTLES DRAWN AEROBIC AND ANAEROBIC Blood Culture results may not be optimal due to an inadequate volume of blood received in culture bottles   Culture   Final    NO GROWTH 5 DAYS Performed at [facility],  [address] [place], [place] [address]    Report Status 03/30/2024 FINAL  Final         Radiology Studies: CT HEAD WO CONTRAST ( ) Result Date: 03/30/2024 CLINICAL DATA:  Mental status change, unknown cause EXAM: CT HEAD WITHOUT CONTRAST TECHNIQUE: Contiguous axial images were obtained from the base of the skull through the vertex without intravenous contrast. RADIATION DOSE REDUCTION: This exam was performed according to the departmental dose-optimization program which  includes automated exposure control, adjustment of the mA and/or kV according to patient size and/or use of iterative reconstruction technique. COMPARISON:  CT head March 28, 2018. FINDINGS: Brain: Remote bilateral cerebellar infarcts. Remote appearing left frontal and left parieto-occipital infarcts. No evidence of acute large vascular territory infarct, acute hemorrhage, mass lesion, midline shift or hydrocephalus. Cerebral atrophy. Vascular: Calcific atherosclerosis. No hyperdense vessel identified. Skull: No acute fracture. Sinuses/Orbits: Clear sinuses.  No acute orbital findings. Other: Small left mastoid effusion. IMPRESSION: 1. No evidence of acute intracranial abnormality. 2. Remote appearing left frontal, left parieto-occipital, and bilateral cerebellar infarcts. MRI could provide more sensitive evaluation for acute infarct if clinically warranted. Electronically Signed   By: Gilmore GORMAN Molt M.D.   On: 03/30/2024 23:00   US  RENAL Result Date: 03/29/2024 CLINICAL DATA:  Hydronephrosis. EXAM: RENAL / URINARY TRACT ULTRASOUND COMPLETE COMPARISON:  03/27/2024, 01/09/2024. FINDINGS: Right Kidney: Renal measurements: 8.8 x 3.9 x 4.7 cm = volume: 85.5 mL. Increased parenchymal echogenicity. Multiple cysts are identified, the largest measuring 1.7 x 1.1 x 1.3 cm. There is mild hydronephrosis with ureteral stent in place. Left Kidney: Renal measurements: 9.7 x 4.9 x 5.0 cm = volume: 123.5 mL. Increased parenchymal echogenicity. Multiple cysts are identified, largest measuring 1.5 x 0.9 x 1.1 cm. No mass or hydronephrosis visualized. Bladder: The bladder is minimally distended in a Foley catheter is noted. Other: None. IMPRESSION: 1. Mild hydronephrosis on the right with ureteral stent in place. 2. Increased renal echogenicity and cysts bilaterally, compatible with medical renal disease. 3. Foley catheter in place. Electronically Signed   By: Leita Birmingham M.D.   On: 03/29/2024 14:12        Scheduled  Meds:  brimonidine   1 drop Both Eyes BID   Chlorhexidine  Gluconate Cloth  6 each Topical Daily   cloNIDine   0.1 mg Oral Daily   diltiazem   300 mg Oral Daily   enoxaparin  (LOVENOX ) injection  30 mg Subcutaneous Q24H   hydrALAZINE   50 mg Oral Q8H   insulin  aspart  0-9 Units Subcutaneous TID WC   metoCLOPramide   5 mg Oral BID   metoprolol  tartrate  50 mg Oral BID   pantoprazole   40 mg Oral Daily   polyethylene glycol  17 g Oral Daily   rosuvastatin   10 mg Oral Daily   sodium bicarbonate   1,300 mg Oral BID   sodium chloride  flush  3 mL Intravenous Q12H   Continuous Infusions:  sodium chloride  75 mL/hr at 03/29/24 2213   cefTRIAXone  (ROCEPHIN )  IV Stopped (03/30/24 2243)     LOS: 9 days       Anthony CHRISTELLA Pouch, MD [facility] Pager [phone]  If 7PM-7AM, please contact night-coverage [email or web address] 03/31/2024, 9:10 AM

## 2024-03-31 NOTE — Plan of Care (Signed)
  Problem: Coping: Goal: Ability to adjust to condition or change in health will improve Outcome: Progressing   Problem: Respiratory: Goal: Ability to maintain adequate ventilation will improve Outcome: Progressing   Problem: Coping: Goal: Level of anxiety will decrease Outcome: Progressing

## 2024-03-31 DEATH — deceased

## 2024-04-01 ENCOUNTER — Encounter: Admission: EM | Disposition: E | Payer: Self-pay | Source: Home / Self Care | Attending: Internal Medicine

## 2024-04-01 ENCOUNTER — Inpatient Hospital Stay

## 2024-04-01 DIAGNOSIS — N189 Chronic kidney disease, unspecified: Secondary | ICD-10-CM | POA: Diagnosis not present

## 2024-04-01 DIAGNOSIS — N179 Acute kidney failure, unspecified: Secondary | ICD-10-CM

## 2024-04-01 DIAGNOSIS — A419 Sepsis, unspecified organism: Secondary | ICD-10-CM | POA: Diagnosis not present

## 2024-04-01 DIAGNOSIS — N390 Urinary tract infection, site not specified: Secondary | ICD-10-CM | POA: Diagnosis not present

## 2024-04-01 DIAGNOSIS — K567 Ileus, unspecified: Secondary | ICD-10-CM | POA: Diagnosis not present

## 2024-04-01 DIAGNOSIS — Z4901 Encounter for fitting and adjustment of extracorporeal dialysis catheter: Secondary | ICD-10-CM

## 2024-04-01 DIAGNOSIS — R6520 Severe sepsis without septic shock: Secondary | ICD-10-CM

## 2024-04-01 HISTORY — PX: TEMPORARY DIALYSIS CATHETER: CATH118312

## 2024-04-01 LAB — CBC
HCT: 27.4 % — ABNORMAL LOW (ref 36.0–46.0)
Hemoglobin: 8.6 g/dL — ABNORMAL LOW (ref 12.0–15.0)
MCH: 28.9 pg (ref 26.0–34.0)
MCHC: 31.4 g/dL (ref 30.0–36.0)
MCV: 91.9 fL (ref 80.0–100.0)
Platelets: 290 10*3/uL (ref 150–400)
RBC: 2.98 MIL/uL — ABNORMAL LOW (ref 3.87–5.11)
RDW: 15.1 % (ref 11.5–15.5)
WBC: 12.7 10*3/uL — ABNORMAL HIGH (ref 4.0–10.5)
nRBC: 0 % (ref 0.0–0.2)

## 2024-04-01 LAB — BASIC METABOLIC PANEL WITH GFR
Anion gap: 10 (ref 5–15)
BUN: 66 mg/dL — ABNORMAL HIGH (ref 8–23)
CO2: 18 mmol/L — ABNORMAL LOW (ref 22–32)
Calcium: 8 mg/dL — ABNORMAL LOW (ref 8.9–10.3)
Chloride: 106 mmol/L (ref 98–111)
Creatinine, Ser: 3.79 mg/dL — ABNORMAL HIGH (ref 0.44–1.00)
GFR, Estimated: 11 mL/min — ABNORMAL LOW (ref >60)
Glucose, Bld: 181 mg/dL — ABNORMAL HIGH (ref 70–99)
Potassium: 3.4 mmol/L — ABNORMAL LOW (ref 3.5–5.1)
Sodium: 134 mmol/L — ABNORMAL LOW (ref 135–145)

## 2024-04-01 LAB — GLUCOSE, CAPILLARY
Glucose-Capillary: 177 mg/dL — ABNORMAL HIGH (ref 70–99)
Glucose-Capillary: 166 mg/dL — ABNORMAL HIGH (ref 70–99)
Glucose-Capillary: 158 mg/dL — ABNORMAL HIGH (ref 70–99)

## 2024-04-01 LAB — MAGNESIUM: Magnesium: 2.1 mg/dL (ref 1.7–2.4)

## 2024-04-01 SURGERY — TEMPORARY DIALYSIS CATHETER
Anesthesia: Moderate Sedation

## 2024-04-01 MED ORDER — LIDOCAINE HCL (PF) 1 % IJ SOLN
INTRAMUSCULAR | Status: DC | PRN
  Administered 2024-04-01: 5 mL via INTRADERMAL

## 2024-04-01 MED ORDER — ADULT MULTIVITAMIN W/MINERALS CH
1.0000 | ORAL_TABLET | Freq: Every day | ORAL | Status: DC
  Filled 2024-04-01: qty 1

## 2024-04-01 MED ORDER — POTASSIUM CHLORIDE 10 MEQ/100ML IV SOLN
10.0000 meq | INTRAVENOUS | Status: AC
  Administered 2024-04-01 (×2): 10 meq via INTRAVENOUS
  Filled 2024-04-01 (×2): qty 100

## 2024-04-01 MED ORDER — IOHEXOL 9 MG/ML PO SOLN
500.0000 mL | ORAL | Status: AC
  Administered 2024-04-01 (×2): 500 mL via ORAL

## 2024-04-01 MED ORDER — THIAMINE HCL 100 MG PO TABS
100.0000 mg | ORAL_TABLET | Freq: Every day | ORAL | Status: DC
  Filled 2024-04-01 (×2): qty 1

## 2024-04-01 MED ORDER — BOOST / RESOURCE BREEZE PO LIQD CUSTOM
1.0000 | Freq: Three times a day (TID) | ORAL | Status: DC
  Administered 2024-04-01 – 2024-04-02 (×3): 1 via ORAL

## 2024-04-01 SURGICAL SUPPLY — 5 items
COVER PROBE ULTRASOUND 5X96 (MISCELLANEOUS) IMPLANT
KIT DIALYSIS CATH TRI 30X13 (CATHETERS) IMPLANT
NDL PERC 18GX7CM (NEEDLE) IMPLANT
NEEDLE PERC 18GX7CM (NEEDLE) ×1 IMPLANT
SET INTRO CAPELLA COAXIAL (SET/KITS/TRAYS/PACK) IMPLANT

## 2024-04-01 NOTE — Progress Notes (Signed)
 PT Cancellation Note  Patient Details Name: Stephanie [name] MRN: [id number] DOB: Mar 23, 1941   Cancelled Treatment:     PT attempt. Family at bedside.Spouse reports, She hasn't really been able to wake up. She did a little earlier but now is mostly non responsive. Pt was unable to I'ly open her eyes. Sternal rub preformed with response however did not open eyes. Unable to follow even the simplest commands. Unsafe to participate at this time. CT ordered. Will await results. MD notified of pt's change in presentation from last time author observed her during a session.    [name] 04/01/2024, 10:38 AM

## 2024-04-01 NOTE — Progress Notes (Addendum)
 Initial Nutrition Assessment  DOCUMENTATION CODES:   Not applicable  INTERVENTION:   Recommend TPN initiation if plan is for full aggressive care and pt is to remain on NPO/clear liquid diet   Boost Breeze po TID, each supplement provides 250 kcal and 9 grams of protein  MVI po daily   Rena-vit po daily with diet advancement   Pt at high refeed risk; recommend monitor potassium, magnesium  and phosphorus labs daily until stable  Daily weights   NUTRITION DIAGNOSIS:   Inadequate oral intake related to altered GI function as evidenced by other (comment) (pt on NPO/clear liquid diet since admit).  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  RD unable to see patient today as pt having temporary dialysis catheter placed at time of RD visit. Pt is known to this RD from a previous lengthy admission where pt required TPN and temporary HD for SBO/AKI. Per chart review, pt with poor appetite and oral intake for 3 weeks pta with reported nausea and vomiting for 2-3 days pta. Pt has remained on a NPO/liquid diet since admission and is now without adequate nutrition for > 7 days. Would recommend TPN initiation if plan is for full aggressive care and pt  is to remain on clear liquids; this was discussed with medical team. Pt is at high refeed risk. RD will add supplements and vitamins to help pt meet her estimated needs. Plan is for HD. Pt is noted to be having bowel movements. Abdomen distended per MD note. No new weight since admit; will order daily weights. Pt does appear weight stable at baseline. RD will obtain history and exam at follow-up. Palliative care consult is pending.    Medications reviewed and include: lovenox , insulin , reglan , protonix , miralax , sodium bicarbonate , fleet enema, NaCl @75ml /hr, KCl  Labs reviewed: Na 134(L), K 3.4(L), BUN 66(H), creat 3.79(H), Mg 2.1 wnl Wbc- 12.7(H), Hgb 8.6(L), Hct 27.4(L) Cbgs- 166, 177 x 24 hrs  NUTRITION - FOCUSED PHYSICAL EXAM: Unable to perform at this time   Diet Order:   Diet Order             Diet NPO time specified  Diet effective midnight           Diet clear liquid Room service appropriate? Yes; Fluid consistency: Thin  Diet effective now                  EDUCATION NEEDS:   Not appropriate for education at this time  Skin:  Skin Assessment: Reviewed RN Assessment (ecchymosis)  Last BM:  7/1- type 7  Height:   Ht Readings from Last 1 Encounters:  03/22/24 5' 6 (1.676 m)    Weight:   Wt Readings from Last 1 Encounters:  03/22/24 69.4 kg  Ideal Body Weight:  59 kg  BMI:  Body mass index is 24.69 kg/m.  Estimated Nutritional Needs:   Kcal:  1600-1800kcal/day  Protein:  80-90g/day  Fluid:  1.6-1.8L/day  Augustin Shams MS, RD, LDN If unable to be reached, please send secure chat to RD inpatient available from 8:00a-4:00p daily

## 2024-04-01 NOTE — Progress Notes (Signed)
 [facility] SURGICAL PROGRESS NOTE (cpt [phone])  Hospital Day(s): 10.   Interval History: Patient seen and examined, no acute events or new complaints overnight. Patient is fairly somnolent this morning. No significant abdominal pain but certainly remains distended. No emesis. Leukocytosis fluctuating; 12.7K. Hgb to 8.6. Renal function improving; sCr - 3.79; UO - unmeasured. Hypokalemia to 3.4. NGT out yesterday (06/29). Does have recorded BM  Review of Systems:  Constitutional: denies fever, chills  HEENT: denies cough or congestion  Respiratory: denies any shortness of breath  Cardiovascular: denies chest pain or palpitations  Gastrointestinal: +distension; denied abdominal pain, N/V Genitourinary: denies burning with urination or urinary frequency Musculoskeletal: denies pain, decreased motor or sensation  Vital signs in last 24 hours: [min-max] current  Temp:  [97.5 F (36.4 C)-98 F (36.7 C)] 97.5 F (36.4 C) (07/01 2035) Pulse Rate:  [49-56] 52 (07/02 0525) Resp:  [14-20] 14 (07/02 0525) BP: (150-165)/(58-60) 163/60 (07/02 0525) SpO2:  [95 %-100 %] 96 % (07/02 0525)     Height: 5' 6 (167.6 cm) Weight: 69.4 kg BMI (Calculated): 24.71   Intake/Output last 2 shifts:  07/01 0701 - 07/02 0700 In: 1631.3 [P.O.:960; I.V.:671.3] Out: -    Physical Exam:  Constitutional: alert, cooperative and no distress  HENT: normocephalic without obvious abnormality; hard of hearing  Eyes: PERRL, EOM's grossly intact and symmetric  Respiratory: breathing non-labored at rest  Cardiovascular: regular rate and sinus rhythm  Gastrointestinal: soft, she is non-tender, she is distended and tympanic this AM, no rebound/guarding. She is certainly not peritonitic Musculoskeletal: no edema or wounds, motor and sensation grossly intact, NT    Labs:     Latest Ref Rng & Units 04/01/2024    4:39 AM 03/31/2024    4:04 AM 03/30/2024    4:27 AM  CBC  WBC 4.0 - 10.5 K/uL 12.7  11.9  12.2    Hemoglobin 12.0 - 15.0 g/dL 8.6  8.5  9.2   Hematocrit 36.0 - 46.0 % 27.4  27.0  30.9   Platelets 150 - 400 K/uL 290  274  324       Latest Ref Rng & Units 04/01/2024    4:39 AM 03/31/2024    4:04 AM 03/30/2024    4:27 AM  CMP  Glucose 70 - 99 mg/dL 818  800  786   BUN 8 - 23 mg/dL 66  74  72   Creatinine 0.44 - 1.00 mg/dL 6.20  5.88  5.67   Sodium 135 - 145 mmol/L 134  136  136   Potassium 3.5 - 5.1 mmol/L 3.4  3.6  3.3   Chloride 98 - 111 mmol/L 106  107  105   CO2 22 - 32 mmol/L 18  20  16    Calcium  8.9 - 10.3 mg/dL 8.0  7.7  8.0      Imaging studies:  KUB (04/01/2024) personally reviewed with continued bowel dilation, no free air, and radiologist report pending...    Assessment/Plan: 83 y.o. female admitted with UTI also with ileus (? Colonic) vs pSBO vs large bowel obstruction secondary to stool impaction               - Given recurrence of distension and persistent bowel dilation on KUB this morning, I will repeat CT Abdomen/Pelvis with PO contrast if tolerated. Unfortunately, no IV contrast given sCr elevation.   - I do not think she needs emergent surgical intervention at this time; however, pending repeat CT evaluation, we may need  to consider this if again this appears obstructive in nature.  - Diet backed down to CLD.   - Monitor renal function; monitor UO - Monitor abdominal examination; on-going bowel function - Pain control prn; avoid narcotics if feasible   - Antiemetics prn              - Mobilize as tolerated              - Further management per primary service; we will follow   All of the above findings and recommendations were discussed with the patient, patient's family (husband at bedside), and the medical team, and all of their questions were answered to their expressed satisfaction.  -- Arthea Platt, PA-C [facility] 04/01/2024, 7:21 AM M-F: 7am - 4pm

## 2024-04-01 NOTE — Progress Notes (Addendum)
 Progress Note   Patient: Stephanie [name] FMW:[id number] DOB: 07/09/1941 DOA: 03/22/2024     10 DOS: the patient was seen and examined on 04/01/2024   Brief hospital course: Stephanie [name] is a 83 y.o. female with medical history significant of stage 4 CKD with anemia, afib not on AC, chronic urinary retention with foley, remote colon CA, DM, HLD, HTN, RA, and SBO who presented on 6/22 with generalized weakness. She reports that she came in because they made me.  She was so weak that she couldn't get from the bed to the couch.  She hardly ate at all yesterday.  She has been complaining that I feel terrible.  No obvious urinary symptoms.  No fever.  She has been tired all the time with poor appetite for several weeks but it was clearly worse today.     ER Course:  Sepsis from UTI.  h/o ESBL UTI, chronic retention with foley, UPJ stent.  Generalized weakness, vomiting.  WBC 18, lactate 2.  Urine looks purulent, foley exchanged.  DVT US  negative.  CT with stent in place, improving hydro, no obstructive uropathy.  Started on carbapenem due to AKI on CKD.   Assessment and Plan:  Sepsis due to UTI: E coli bacteremia Secondary to chronic indwelling Foley catheter Catheter was exchanged in the ER on admission 06/22 Continue IV Rocephin  to complete a 10 day course of therapy per ID   Metabolic encephalopathy Most likely secondary to sepsis as well as worsening renal function Patient noted to be very lethargic, arouses easily but falls right back to sleep Expect improvement in mental status following resolution of acute illness    H/o urinary retention and hydronephrosis:  Foley catheter exchanged in the ER. S/p stent.  Mild right hydronephrosis as per renal US .     Acute kidney injury superimposed on stage V chronic kidney disease Nephrology consult for recommendations as this may be contributing to patient's altered mental status    Partial SBO: Patient's abdomen remains  distended Appreciate surgical input Diet has been downgraded to clear liquids Monitor with serial abdominal exams Patient had an NG tube which has been discontinued      Transaminitis:  Resolved       Hypokalemia: Supplement potassium    ACD:  Secondary to chronic kidney disease. 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  Continue on SSI w/ accucheck       HTN:  continue on home dose of metoprolol , diltiazem , hydralazine  & clonidine  (started this admission).  Patient is bradycardic and will discontinue clonidine  Continue metoprolol  and diltiazem  with holding parameters Continue hydralazine    RA:  Leflunomide  is on hold due to active infection     COPD:  No acutely exacerbated.  Continue on bronchodilators     Glaucoma:  Continue on home dose of brimonidine        Subjective: patient is lethargic but arouses easily and falls right back to sleep.  Sister is at the bedside and states that this is different from her baseline.  Physical Exam: Vitals:   04/01/24 0525 04/01/24 0830 04/01/24 1044 04/01/24 1346  BP: (!) 163/60 (!) 158/69 134/64 (!) 156/62  Pulse: (!) 52 (!) 57 (!) 51 (!) 54  Resp: 14 18 16 16   Temp:      TempSrc:      SpO2: 96% 97% 95% 94%  Weight:      Height:  General exam: Lethargic but arouses easily Respiratory system: clear breath sounds b/l  Cardiovascular system: S1/S2+. No rubs or clicks  Gastrointestinal system: abd is soft, NT, distended, hypoactive bowel sounds Central nervous system:   lethargic Psychiatry: Unable to assess   Data Reviewed: Potassium 3.4, hemoglobin 8.6, white count 12.7, BUN 66, creatinine 3.79 Labs reviewed  Family Communication: Plan of care discussed with patient's husband and sister at the bedside.  All questions and concerns have been addressed.  They verbalized understanding and agree with the plan.  Disposition: Status is:  Inpatient Remains inpatient appropriate because: Requires initiation of renal replacement therapy  Planned Discharge Destination: TBD    Time spent: 60 minutes  Author: Aimee Somerset, MD 04/01/2024 2:11 PM  For on call review [email or web address].

## 2024-04-01 NOTE — Plan of Care (Signed)
  Problem: Coping: Goal: Ability to adjust to condition or change in health will improve Outcome: Progressing   Problem: Skin Integrity: Goal: Risk for impaired skin integrity will decrease Outcome: Progressing   Problem: Respiratory: Goal: Ability to maintain adequate ventilation will improve Outcome: Progressing   Problem: Activity: Goal: Risk for activity intolerance will decrease Outcome: Progressing   Problem: Coping: Goal: Level of anxiety will decrease Outcome: Progressing   Problem: Pain Managment: Goal: General experience of comfort will improve and/or be controlled Outcome: Progressing   Problem: Safety: Goal: Ability to remain free from injury will improve Outcome: Progressing   Problem: Skin Integrity: Goal: Risk for impaired skin integrity will decrease Outcome: Progressing

## 2024-04-01 NOTE — Consult Note (Signed)
 Hospital Consult    Reason for Consult:  Placement of temporary dialysis catheter  Requesting Physician:  Dr Pershing Sherry MD MRN #:  [id number]  History of Present Illness: This is a 83 y.o. 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  She presents to [facility] emergency department on 03/22/24 with generalized weakness.  Upon workup in the emergency department she was deemed to have sepsis from UTI.  She had white blood cell count of 18 and a lactate of 2.  She had chronic retention with her Foley and she has a UPJ stent.  The Foley catheter was exchanged at the time the urine looked purulent.  Upon exam today the patient is resting comfortably in bed with patient's husband at the bedside as well as other family members.  Patient is not responding well to my questions or following commands.  Her BUN today is 66 and her creatinine is 3.79 with a GFR of 11.  Vascular surgery was consulted for placement of a temporary dialysis catheter at this time.  Past Medical History:  Diagnosis Date   Anemia    Aortic atherosclerosis (HCC)    Arthritis    Asthma    CKD (chronic kidney disease), stage IV (HCC)    Colon cancer (HCC) 1996   Complication of anesthesia    a.) PONV   Diabetes mellitus without complication (HCC)    Dyspnea    Dysrhythmia    Foley catheter in place    GERD (gastroesophageal reflux disease)    Glaucoma    Hydronephrosis of right kidney    Hyperlipidemia    Hyperparathyroidism due to renal insufficiency (HCC)    Hypertension    Lumbar disc disease    Pneumonia    PONV (postoperative nausea and vomiting)    RBBB (right bundle branch block)    Rheumatoid arthritis involving multiple sites with positive rheumatoid factor (HCC)    Small bowel obstruction (HCC)    Urinary retention    Uses walker    Vitamin B 12 deficiency    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    has, does not wear    Past Surgical History:  Procedure  Laterality Date   ABDOMINAL HYSTERECTOMY     BACK SURGERY  2006   x5  screws and rods   BREAST BIOPSY Right 11/01/2021   rt br biopsy calcs ribbon clip/ benign 2nd site LIQ   BREAST BIOPSY Right 11/01/2021   rt br stereo calcs coil clip 1st site / benign UIQ   COLON SURGERY     COLONOSCOPY WITH PROPOFOL  N/A 05/22/2017   Procedure: COLONOSCOPY WITH PROPOFOL ;  Surgeon: Viktoria Lamar DASEN, MD;  Location: [facility] ENDOSCOPY;  Service: Endoscopy;  Laterality: N/A;   COLONOSCOPY WITH PROPOFOL  N/A 06/08/2021   Procedure: COLONOSCOPY WITH PROPOFOL ;  Surgeon: Jinny Carmine, MD;  Location: [facility];  Service: Endoscopy;  Laterality: N/A;  Diabetic   CYSTOSCOPY W/ RETROGRADES Right 11/06/2022   Procedure: CYSTOSCOPY WITH RETROGRADE PYELOGRAM;  Surgeon: Twylla Glendia BROCKS, MD;  Location: [facility] ORS;  Service: Urology;  Laterality: Right;   CYSTOSCOPY W/ URETERAL STENT PLACEMENT  07/25/2021   Procedure: CYSTOSCOPY WITH RETROGRADE PYELOGRAM/URETERAL STENT PLACEMENT;  Surgeon: Twylla Glendia BROCKS, MD;  Location: [facility] ORS;  Service: Urology;;   CYSTOSCOPY W/ URETERAL STENT PLACEMENT  02/06/2022   Procedure: CYSTOSCOPY WITH RETROGRADE PYELOGRAM/URETERAL STENT EXCHANGE;  Surgeon: Twylla Glendia BROCKS, MD;  Location: [facility] ORS;  Service: Urology;;   PHYLLIS  W/ URETERAL STENT PLACEMENT Right 11/06/2022   Procedure: CYSTOSCOPY WITH STENT EXCHANGE;  Surgeon: Twylla Glendia BROCKS, MD;  Location: [facility] ORS;  Service: Urology;  Laterality: Right;   CYSTOSCOPY W/ URETERAL STENT PLACEMENT Right 12/03/2023   Procedure: CYSTOSCOPY WITH STENT EXCHANGE;  Surgeon: Twylla Glendia BROCKS, MD;  Location: [facility] ORS;  Service: Urology;  Laterality: Right;   CYSTOSCOPY WITH STENT PLACEMENT Right 08/02/2020   Procedure: CYSTOSCOPY WITH STENT EXCHANGE;  Surgeon: Twylla Glendia BROCKS, MD;  Location: [facility] ORS;  Service: Urology;  Laterality: Right;   CYSTOSCOPY WITH STENT PLACEMENT Right 06/18/2020   Procedure: CYSTOSCOPY WITH STENT PLACEMENT;  Surgeon:  Watt Rush, MD;  Location: [facility] ORS;  Service: Urology;  Laterality: Right;   EYE SURGERY Bilateral    Cataracts   FRACTURE SURGERY Right 2011   foot surgery   JOINT REPLACEMENT Bilateral    knees   KNEE ARTHROSCOPY Bilateral    LAPAROTOMY N/A 06/30/2020   Procedure: EXPLORATORY LAPAROTOMY;  Surgeon: Jordis Laneta FALCON, MD;  Location: [facility] ORS;  Service: General;  Laterality: N/A;   LUMBAR LAMINECTOMY     POLYPECTOMY  06/08/2021   Procedure: POLYPECTOMY;  Surgeon: Jinny Carmine, MD;  Location: [facility];  Service: Endoscopy;;   TEMPORARY DIALYSIS CATHETER N/A 06/27/2020   Procedure: TEMPORARY DIALYSIS CATHETER;  Surgeon: Jama Cordella MATSU, MD;  Location: [facility] INVASIVE CV LAB;  Service: Cardiovascular;  Laterality: N/A;    Allergies  Allergen Reactions   Remicade [Infliximab] Hives, Shortness Of Breath and Itching   Ibuprofen Itching   Indomethacin Other (See Comments)    headache   Methotrexate Derivatives Other (See Comments)    Headache   Moexipril Other (See Comments)    unknown   Percocet [Oxycodone -Acetaminophen ] Itching   Naprosyn [Naproxen] Rash    Prior to Admission medications   Medication Sig Start Date End Date Taking? Authorizing Provider  acetaminophen  (TYLENOL ) 325 MG tablet Take 2 tablets (650 mg total) by mouth every 6 (six) hours as needed for moderate pain, fever or headache. 07/10/20  Yes Fausto Sor A, DO  ascorbic acid  (VITAMIN C) 500 MG tablet Take 1 tablet (500 mg total) by mouth daily. 07/10/20  Yes Fausto Sor A, DO  brimonidine  (ALPHAGAN ) 0.2 % ophthalmic solution Place 1 drop into both eyes 2 (two) times daily. 05/31/20  Yes [provider]  Cranberry 400 MG CAPS Take 400 mg by mouth daily.  03/24/20  Yes [provider]  diltiazem  (CARDIZEM  CD) 240 MG 24 hr capsule Take 240 mg by mouth daily. 11/06/23  Yes [provider]  glipiZIDE  (GLUCOTROL ) 10 MG tablet Take 10 mg by mouth daily before breakfast. 02/05/24  Yes  [provider]  Iron -Vitamin C (VITRON-C) 65-125 MG TABS Take 1 tablet by mouth daily.   Yes [provider]  leflunomide  (ARAVA ) 10 MG tablet Take 10 mg by mouth daily.   Yes [provider]  metoCLOPramide  (REGLAN ) 5 MG tablet SMARTSIG:1 Tablet(s) By Mouth Morning-Night 01/31/24  Yes [provider]  metoprolol  tartrate (LOPRESSOR ) 50 MG tablet Take 50 mg by mouth 2 (two) times daily.   Yes [provider]  omeprazole (PRILOSEC) 40 MG capsule Take 40 mg by mouth daily. 09/11/20  Yes [provider]  Probiotic Product (PROBIOTIC-10 PO) Take 1 capsule by mouth daily.   Yes [provider]  rosuvastatin  (CRESTOR ) 10 MG tablet Take 10 mg by mouth daily.  03/24/20  Yes [provider]  sulfaSALAzine (AZULFIDINE) 500 MG EC tablet Take  500 mg by mouth daily. 02/26/24 05/26/24 Yes [provider]  torsemide  (DEMADEX ) 10 MG tablet Take 10 mg by mouth daily. One in morning and one in the evening 11/26/22  Yes [provider]  vitamin B-12 (CYANOCOBALAMIN ) 1000 MCG tablet Take 1,000 mcg by mouth daily.   Yes [provider]  VITAMIN D, CHOLECALCIFEROL, PO Take 1 capsule by mouth daily.   Yes [provider]  albuterol  (VENTOLIN  HFA) 108 (90 Base) MCG/ACT inhaler Inhale 1-2 puffs into the lungs every 6 (six) hours as needed for wheezing or shortness of breath. Patient not taking: Reported on 03/22/2024    [provider]  CONTOUR TEST test strip 1 each by Other route daily.  03/09/18   [provider]  fluticasone -salmeterol (ADVAIR) 100-50 MCG/ACT AEPB Inhale 1 puff into the lungs 2 (two) times daily. Patient not taking: Reported on 03/22/2024    [provider]  Magnesium  Oxide 250 MG TABS Take 250 mg by mouth 2 (two) times daily. Patient not taking: Reported on 03/22/2024    [provider]  triamcinolone  (KENALOG ) 0.025 % ointment Apply 1 application. topically 2 (two)  times daily. Patient not taking: Reported on 03/22/2024 12/22/21   Helon Clotilda LABOR, PA-C    Social History   Socioeconomic History   Marital status: Married    Spouse name: [name]   Number of children: Not on file   Years of education: Not on file   Highest education level: Not on file  Occupational History   Not on file  Tobacco Use   Smoking status: Never   Smokeless tobacco: Never  Vaping Use   Vaping status: Never Used  Substance and Sexual Activity   Alcohol use: No   Drug use: No   Sexual activity: Yes  Other Topics Concern   Not on file  Social History Narrative   Not on file   Social Drivers of Health   Financial Resource Strain: Low Risk  (09/05/2023)   Received from [facility]   Overall Financial Resource Strain (CARDIA)    Difficulty of Paying Living Expenses: Not hard at all  Food Insecurity: No Food Insecurity (03/22/2024)   Hunger Vital Sign    Worried About Running Out of Food in the Last Year: Never true    Ran Out of Food in the Last Year: Never true  Transportation Needs: No Transportation Needs (03/22/2024)   PRAPARE - Administrator, Civil Service (Medical): No    Lack of Transportation (Non-Medical): No  Physical Activity: Not on file  Stress: Not on file  Social Connections: Patient Declined (03/22/2024)   Social Connection and Isolation Panel    Frequency of Communication with Friends and Family: Patient declined    Frequency of Social Gatherings with Friends and Family: Patient declined    Attends Religious Services: Patient declined    Database administrator or Organizations: Patient declined    Attends Banker Meetings: Patient declined    Marital Status: Patient declined  Intimate Partner Violence: Not At Risk (03/22/2024)   Humiliation, Afraid, Rape, and Kick questionnaire    Fear of Current or Ex-Partner: No    Emotionally Abused: No    Physically Abused: No    Sexually Abused: No      Family History  Problem Relation Age of Onset   Breast cancer Neg Hx     ROS: Otherwise negative unless mentioned in HPI  Physical Examination  Vitals:  04/01/24 0830 04/01/24 1044  BP: (!) 158/69 134/64  Pulse: (!) 57 (!) 51  Resp: 18 16  Temp:    SpO2: 97% 95%   Body mass index is 24.69 kg/m.  General:  WDWN in NAD Gait: Not observed HENT: WNL, normocephalic Pulmonary: normal non-labored breathing, without Rales, rhonchi,  wheezing Cardiac: irregular, history of atrial fibrillation but not on anticoagulation.  History of atrial fibrillation not on anticoagulation, without  Murmurs, rubs or gallops; without carotid bruits Abdomen: Positive bowel sounds throughout, soft, NT/ND, no masses Skin: without rashes Vascular Exam/Pulses: Palpable pulses throughout.  All extremities warm to touch. Extremities: without ischemic changes, without Gangrene , without cellulitis; without open wounds;  Musculoskeletal: no muscle wasting or atrophy  Neurologic: A&O X 3;  No focal weakness or paresthesias are detected; speech is fluent/normal Psychiatric:  The pt has Normal affect. Lymph:  Unremarkable  CBC    Component Value Date/Time   WBC 12.7 (H) 04/01/2024 0439   RBC 2.98 (L) 04/01/2024 0439   HGB 8.6 (L) 04/01/2024 0439   HGB 9.3 (L) 03/03/2024 1010   HGB 10.4 (L) 01/14/2015 0457   HCT 27.4 (L) 04/01/2024 0439   HCT 33.7 (L) 06/23/2014 0857   PLT 290 04/01/2024 0439   PLT 256 03/03/2024 1010   PLT 167 01/14/2015 0457   MCV 91.9 04/01/2024 0439   MCV 92 06/23/2014 0857   MCH 28.9 04/01/2024 0439   MCHC 31.4 04/01/2024 0439   RDW 15.1 04/01/2024 0439   RDW 13.6 06/23/2014 0857   LYMPHSABS 1.2 03/22/2024 0850   LYMPHSABS 1.9 12/01/2013 1133   MONOABS 2.6 (H) 03/22/2024 0850   MONOABS 0.6 12/01/2013 1133   EOSABS 0.0 03/22/2024 0850   EOSABS 0.3 12/01/2013 1133   BASOSABS 0.1 03/22/2024 0850   BASOSABS 0.1 12/01/2013 1133    BMET    Component Value Date/Time    NA 134 (L) 04/01/2024 0439   NA 132 (L) 01/14/2015 0457   K 3.4 (L) 04/01/2024 0439   K 3.9 01/14/2015 0457   CL 106 04/01/2024 0439   CL 100 (L) 01/14/2015 0457   CO2 18 (L) 04/01/2024 0439   CO2 27 01/14/2015 0457   GLUCOSE 181 (H) 04/01/2024 0439   GLUCOSE 189 (H) 01/14/2015 0457   BUN 66 (H) 04/01/2024 0439   BUN 12 01/14/2015 0457   CREATININE 3.79 (H) 04/01/2024 0439   CREATININE 2.80 (H) 01/07/2024 0938   CREATININE 0.66 01/14/2015 0457   CALCIUM  8.0 (L) 04/01/2024 0439   CALCIUM  8.7 (L) 01/14/2015 0457   GFRNONAA 11 (L) 04/01/2024 0439   GFRNONAA 16 (L) 01/07/2024 0938   GFRNONAA >60 01/14/2015 0457   GFRAA 15 (L) 07/05/2020 0449   GFRAA >60 01/14/2015 0457    COAGS: Lab Results  Component Value Date   INR 1.0 07/25/2021   INR 1.6 (H) 06/27/2020   INR 1.8 (H) 06/26/2020     Non-Invasive Vascular Imaging:   EXAM:03/22/24 CT ABDOMEN AND PELVIS WITHOUT CONTRAST   TECHNIQUE: Multidetector CT imaging of the abdomen and pelvis was performed following the standard protocol without IV contrast.   RADIATION DOSE REDUCTION: This exam was performed according to the departmental dose-optimization program which includes automated exposure control, adjustment of the mA and/or kV according to patient size and/or use of iterative reconstruction technique.   COMPARISON:  10/19/2022   FINDINGS: Lower chest: No pleural fluid or consolidative change. Small nodules within the left base are similar measuring up to 4 mm, image 10/4.  Hepatobiliary: No focal liver abnormality. Gallbladder appears normal. Pancreas is suboptimally visualized.   Pancreas: Atrophic appearance of the body and tail of pancreas as before. No signs of inflammation or mass.   Spleen: Normal in size without focal abnormality.   Adrenals/Urinary Tract: Normal adrenal glands. There is a right-sided nephroureteral stent in place. Right-sided pelviectasis is identified. The right renal pelvis  measures 2.2 cm on today's study versus 3.2 cm on the previous exam. No signs of high-grade obstructive uropathy. Left kidney is unremarkable. Urinary bladder is decompressed around a Foley catheter balloon. Distal portion of the right ureteral stent is in the decompressed bladder.   Stomach/Bowel: Stomach appears within normal limits. No pathologic dilatation of the large or small bowel loops. The appendix is not confidently identified. No pericecal inflammation. There is a moderate stool burden identified within the ascending and proximal transverse colon. Large volume of desiccated stool is noted within the distended rectum. There is gaseous distension of the left colon proximal to this level.   Vascular/Lymphatic: Aortic atherosclerosis without aneurysm. No signs of abdominopelvic adenopathy.   Reproductive: Status post hysterectomy. No adnexal masses.   Other: No free fluid or fluid collections. No signs of pneumoperitoneum.   Musculoskeletal: Postoperative changes identified within the lower thoracic, lumbar spine and sacrum. On multilevel lumbar spondylosis noted. No acute osseous pathology.   IMPRESSION: 1. No acute findings within the abdomen or pelvis. 2. Right-sided nephroureteral stent in place. Right-sided pelviectasis is identified. The right renal pelvis measures 2.2 cm on today's study versus 3.2 cm on the previous exam. No signs of high-grade obstructive uropathy. 3. Large volume of desiccated stool is noted within the distended rectum. There is gaseous distension of the left colon proximal to this level. Correlate for any clinical signs or symptoms of constipation or fecal impaction. 4. Small nodules within the left base are similar measuring up to 4 mm. 5.  Aortic Atherosclerosis (ICD10-I70.0).  Statin:  Yes.   Beta Blocker:  Yes.   Aspirin:  No. ACEI:  No. ARB:  No. CCB use:  Yes Other antiplatelets/anticoagulants:  No.    ASSESSMENT/PLAN: This is  a 83 y.o. female who presents to [facility] emergency department with overall generalized weakness.  She has not been eating well and upon admission complained she was not feeling well.  She was noted to be septic from UTI.  She has a history of chronic kidney disease now with acute kidney injury.  Vascular surgery was consulted to evaluate.  Vascular surgery will take the patient to the vascular lab later today on 04/01/2024 for placement of a temporary dialysis catheter for hemodialysis.  Patient, husband and multiple family members at the bedside today.  I discussed in detail for the group the procedure, benefits, risk, complications.  Patient's husband verbalizes his understanding and wishes to proceed for his wife.  I answered all of his and the family's questions today.  Patient has not eaten anything today.  Patient currently is not on any anticoagulation.   -I discussed the case with Dr. [name] MD and he agrees with the plan.   Gwendlyn JONELLE Shank [facility] 04/01/2024 11:36 AM

## 2024-04-01 NOTE — Progress Notes (Signed)
 [facility]  ROUNDING NOTE   Subjective:   We were asked to see the patient for evaluation management of acute kidney injury in the setting of known chronic kidney disease stage IV. She came in on 03/22/2024 with generalized weakness. She has remained quite weak since then. She has history of sepsis from UTI with history of ESBL UTI.  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 Renal function overall remains low with creatinine of 3.79 and EGFR of 11. We had a long discussion with the patient's husband who is open to the idea of renal placement therapy to treat any underlying potential uremia as the patient remains quite lethargic and fatigued.  Objective:  Vital signs in last 24 hours:  Temp:  [97.3 F (36.3 C)-97.5 F (36.4 C)] 97.3 F (36.3 C) (07/02 1602) Pulse Rate:  [51-57] 57 (07/02 1602) Resp:  [14-19] 18 (07/02 1602) BP: (134-165)/(60-69) 147/61 (07/02 1602) SpO2:  [94 %-100 %] 94 % (07/02 1602)  Weight change:  Filed Weights   03/22/24 1000 03/22/24 1414  Weight: 69.4 kg 69.4 kg    Intake/Output: I/O last 3 completed shifts: In: 1691.3 [P.O.:1020; I.V.:671.3] Out: -    Intake/Output this shift:  Total I/O In: 0  Out: 825 [Urine:825]  Physical Exam: General: No acute distress  Head: Normocephalic, atraumatic. Moist oral mucosal membranes  Neck: Supple  Lungs:  Clear to auscultation, normal effort  Heart: S1S2 no rubs  Abdomen:  Soft, nontender, bowel sounds present  Extremities: Trace peripheral edema.  Neurologic: Lethargic, difficult to arouse  Skin: No acute rash  Access: No hemodialysis access    Basic Metabolic Panel: Recent Labs  Lab 03/27/24 0439 03/28/24 0403 03/29/24 0853 03/30/24 0427 03/31/24 0404 04/01/24 0439  NA 135 134* 138 136 136 134*  K 3.2* 3.0* 3.0* 3.3* 3.6 3.4*  CL 102 105 107 105 107 106  CO2 21* 16* 15* 16* 20* 18*  GLUCOSE 145* 114* 121* 213* 199* 181*  BUN 61* 67* 75* 72*  74* 66*  CREATININE 3.91* 4.00* 4.67* 4.32* 4.11* 3.79*  CALCIUM  8.0* 7.7* 8.0* 8.0* 7.7* 8.0*  MG 2.7* 2.7*  --   --   --  2.1    Liver Function Tests: No results for input(s): AST, ALT, ALKPHOS, BILITOT, PROT, ALBUMIN  in the last 168 hours. No results for input(s): LIPASE, AMYLASE in the last 168 hours. No results for input(s): AMMONIA in the last 168 hours.  CBC: Recent Labs  Lab 03/28/24 0403 03/29/24 0853 03/30/24 0427 03/31/24 0404 04/01/24 0439  WBC 12.8* 14.0* 12.2* 11.9* 12.7*  HGB 9.0* 9.1* 9.2* 8.5* 8.6*  HCT 29.5* 29.9* 30.9* 27.0* 27.4*  MCV 94.6 96.1 96.3 92.5 91.9  PLT 328 428* 324 274 290    Cardiac Enzymes: No results for input(s): CKTOTAL, CKMB, CKMBINDEX, TROPONINI in the last 168 hours.  BNP: Invalid input(s): POCBNP  CBG: Recent Labs  Lab 03/31/24 1122 03/31/24 1604 03/31/24 2153 04/01/24 0829 04/01/24 1220  GLUCAP 221* 262* 231* 177* 166*    Microbiology: Results for orders placed or performed during the hospital encounter of 03/22/24  Resp panel by RT-PCR (RSV, Flu A&B, Covid) Anterior Nasal Swab     Status: None   Collection Time: 03/22/24  8:50 AM   Specimen: Anterior Nasal Swab  Result Value Ref Range Status   SARS Coronavirus 2 by RT PCR NEGATIVE NEGATIVE Final    Comment: (NOTE) SARS-CoV-2 target nucleic acids are NOT DETECTED.  The  SARS-CoV-2 RNA is generally detectable in upper respiratory specimens during the acute phase of infection. The lowest concentration of SARS-CoV-2 viral copies this assay can detect is 138 copies/mL. A negative result does not preclude SARS-Cov-2 infection and should not be used as the sole basis for treatment or other patient management decisions. A negative result may occur with  improper specimen collection/handling, submission of specimen other than nasopharyngeal swab, presence of viral mutation(s) within the areas targeted by this assay, and inadequate number of  viral copies(<138 copies/mL). A negative result must be combined with clinical observations, patient history, and epidemiological information. The expected result is Negative.  Fact Sheet for Patients:  BloggerCourse.com  Fact Sheet for Healthcare Providers:  SeriousBroker.it  This test is no t yet approved or cleared by the [place]  FDA and  has been authorized for detection and/or diagnosis of SARS-CoV-2 by FDA under an Emergency Use Authorization (EUA). This EUA will remain  in effect (meaning this test can be used) for the duration of the COVID-19 declaration under Section 564(b)(1) of the Act, 21 U.S.C.section 360bbb-3(b)(1), unless the authorization is terminated  or revoked sooner.       Influenza A by PCR NEGATIVE NEGATIVE Final   Influenza B by PCR NEGATIVE NEGATIVE Final    Comment: (NOTE) The Xpert Xpress SARS-CoV-2/FLU/RSV plus assay is intended as an aid in the diagnosis of influenza from Nasopharyngeal swab specimens and should not be used as a sole basis for treatment. Nasal washings and aspirates are unacceptable for Xpert Xpress SARS-CoV-2/FLU/RSV testing.  Fact Sheet for Patients: BloggerCourse.com  Fact Sheet for Healthcare Providers: SeriousBroker.it  This test is not yet approved or cleared by the [place]  FDA and has been authorized for detection and/or diagnosis of SARS-CoV-2 by FDA under an Emergency Use Authorization (EUA). This EUA will remain in effect (meaning this test can be used) for the duration of the COVID-19 declaration under Section 564(b)(1) of the Act, 21 U.S.C. section 360bbb-3(b)(1), unless the authorization is terminated or revoked.     Resp Syncytial Virus by PCR NEGATIVE NEGATIVE Final    Comment: (NOTE) Fact Sheet for Patients: BloggerCourse.com  Fact Sheet for Healthcare  Providers: SeriousBroker.it  This test is not yet approved or cleared by the [place]  FDA and has been authorized for detection and/or diagnosis of SARS-CoV-2 by FDA under an Emergency Use Authorization (EUA). This EUA will remain in effect (meaning this test can be used) for the duration of the COVID-19 declaration under Section 564(b)(1) of the Act, 21 U.S.C. section 360bbb-3(b)(1), unless the authorization is terminated or revoked.  Performed at [facility], [address], [place], [place] [address]   Urine Culture (for pregnant, neutropenic or urologic patients or patients with an indwelling urinary catheter)     Status: Abnormal   Collection Time: 03/22/24  8:50 AM   Specimen: Urine, Catheterized  Result Value Ref Range Status   Specimen Description   Final    URINE, CATHETERIZED Performed at [facility], [address], [place], [place] [address]    Special Requests   Final    NONE Performed at [facility], [address] [place], [place] [address]    Culture >=100,000 COLONIES/mL ESCHERICHIA COLI (A)  Final   Report Status 03/24/2024 FINAL  Final   Organism ID, Bacteria ESCHERICHIA COLI (A)  Final      Susceptibility   Escherichia coli - MIC*    AMPICILLIN >=32 RESISTANT Resistant     CEFAZOLIN  RESISTANT  Resistant     CEFEPIME  <=0.12 SENSITIVE Sensitive     CEFTRIAXONE  <=0.25 SENSITIVE Sensitive     CIPROFLOXACIN  >=4 RESISTANT Resistant     GENTAMICIN <=1 SENSITIVE Sensitive     IMIPENEM <=0.25 SENSITIVE Sensitive     NITROFURANTOIN  <=16 SENSITIVE Sensitive     TRIMETH /SULFA  >=320 RESISTANT Resistant     AMPICILLIN/SULBACTAM >=32 RESISTANT Resistant     PIP/TAZO 16 SENSITIVE Sensitive ug/mL    * >=100,000 COLONIES/mL ESCHERICHIA COLI  Blood culture (routine x 2)     Status: Abnormal   Collection Time: 03/22/24  9:38 AM   Specimen: BLOOD  Result Value Ref Range Status   Specimen Description   Final     BLOOD LEFT ANTECUBITAL Performed at [facility], [address] [place], [place] [address]    Special Requests   Final    BOTTLES DRAWN AEROBIC AND ANAEROBIC Blood Culture results may not be optimal due to an inadequate volume of blood received in culture bottles Performed at [facility], [address] [place], [place] [address]    Culture  Setup Time   Final    GRAM POSITIVE COCCI AEROBIC BOTTLE ONLY CRITICAL RESULT CALLED TO, READ BACK BY AND VERIFIED WITH: JASON ROBBINS 03/23/24 0445 MW    Culture (A)  Final    STAPHYLOCOCCUS WARNERI THE SIGNIFICANCE OF ISOLATING THIS ORGANISM FROM A SINGLE SET OF BLOOD CULTURES WHEN MULTIPLE SETS ARE DRAWN IS UNCERTAIN. PLEASE NOTIFY THE MICROBIOLOGY DEPARTMENT WITHIN ONE WEEK IF SPECIATION AND SENSITIVITIES ARE REQUIRED. Performed at [facility], [address], [place], [place] [address]    Report Status 03/25/2024 FINAL  Final  Blood culture (routine x 2)     Status: None   Collection Time: 03/22/24  9:38 AM   Specimen: BLOOD  Result Value Ref Range Status   Specimen Description BLOOD BLOOD LEFT ARM  Final   Special Requests   Final    BOTTLES DRAWN AEROBIC AND ANAEROBIC Blood Culture adequate volume   Culture   Final    NO GROWTH 5 DAYS Performed at [facility], [address] [place], [place] [address]    Report Status 03/27/2024 FINAL  Final  Blood Culture ID Panel (Reflexed)     Status: Abnormal   Collection Time: 03/22/24  9:38 AM  Result Value Ref Range Status   Enterococcus faecalis NOT DETECTED NOT DETECTED Final   Enterococcus Faecium NOT DETECTED NOT DETECTED Final   Listeria monocytogenes NOT DETECTED NOT DETECTED Final   Staphylococcus species DETECTED (A) NOT DETECTED Final    Comment: CRITICAL RESULT CALLED TO, READ BACK BY AND VERIFIED WITH: JASON ROBBINS 03/23/24 0445 MW    Staphylococcus aureus (BCID) NOT DETECTED NOT DETECTED Final   Staphylococcus epidermidis NOT DETECTED  NOT DETECTED Final   Staphylococcus lugdunensis NOT DETECTED NOT DETECTED Final   Streptococcus species NOT DETECTED NOT DETECTED Final   Streptococcus agalactiae NOT DETECTED NOT DETECTED Final   Streptococcus pneumoniae NOT DETECTED NOT DETECTED Final   Streptococcus pyogenes NOT DETECTED NOT DETECTED Final   A.calcoaceticus-baumannii NOT DETECTED NOT DETECTED Final   Bacteroides fragilis NOT DETECTED NOT DETECTED Final   Enterobacterales NOT DETECTED NOT DETECTED Final   Enterobacter cloacae complex NOT DETECTED NOT DETECTED Final   Escherichia coli NOT DETECTED NOT DETECTED Final   Klebsiella aerogenes NOT DETECTED NOT DETECTED Final   Klebsiella oxytoca NOT DETECTED NOT DETECTED Final   Klebsiella pneumoniae NOT DETECTED NOT DETECTED Final   Proteus  species NOT DETECTED NOT DETECTED Final   Salmonella species NOT DETECTED NOT DETECTED Final   Serratia marcescens NOT DETECTED NOT DETECTED Final   Haemophilus influenzae NOT DETECTED NOT DETECTED Final   Neisseria meningitidis NOT DETECTED NOT DETECTED Final   Pseudomonas aeruginosa NOT DETECTED NOT DETECTED Final   Stenotrophomonas maltophilia NOT DETECTED NOT DETECTED Final   Candida albicans NOT DETECTED NOT DETECTED Final   Candida auris NOT DETECTED NOT DETECTED Final   Candida glabrata NOT DETECTED NOT DETECTED Final   Candida krusei NOT DETECTED NOT DETECTED Final   Candida parapsilosis NOT DETECTED NOT DETECTED Final   Candida tropicalis NOT DETECTED NOT DETECTED Final   Cryptococcus neoformans/gattii NOT DETECTED NOT DETECTED Final    Comment: Performed at [facility], [address] [place], [place] [address]  MRSA Next Gen by PCR, Nasal     Status: None   Collection Time: 03/23/24  9:20 AM   Specimen: Nasal Mucosa; Nasal Swab  Result Value Ref Range Status   MRSA by PCR Next Gen NOT DETECTED NOT DETECTED Final    Comment: (NOTE) The GeneXpert MRSA Assay (FDA approved for NASAL specimens only), is one  component of a comprehensive MRSA colonization surveillance program. It is not intended to diagnose MRSA infection nor to guide or monitor treatment for MRSA infections. Test performance is not FDA approved in patients less than 66 years old. Performed at [facility], [address] [place], [place] [address]   Culture, blood (single) w Reflex to ID Panel     Status: None   Collection Time: 03/23/24  4:45 PM   Specimen: BLOOD  Result Value Ref Range Status   Specimen Description BLOOD BLOOD RIGHT HAND  Final   Special Requests   Final    BOTTLES DRAWN AEROBIC AND ANAEROBIC Blood Culture adequate volume   Culture   Final    NO GROWTH 5 DAYS Performed at [facility], [address] [place], [place] [address]    Report Status 03/28/2024 FINAL  Final  Culture, blood (Routine X 2) w Reflex to ID Panel     Status: None   Collection Time: 03/25/24 12:49 PM   Specimen: BLOOD  Result Value Ref Range Status   Specimen Description BLOOD BLOOD LEFT HAND  Final   Special Requests   Final    BOTTLES DRAWN AEROBIC AND ANAEROBIC Blood Culture adequate volume   Culture   Final    NO GROWTH 5 DAYS Performed at [facility], [address] [place], [place] [address]    Report Status 03/30/2024 FINAL  Final  Culture, blood (Routine X 2) w Reflex to ID Panel     Status: None   Collection Time: 03/25/24 12:50 PM   Specimen: BLOOD  Result Value Ref Range Status   Specimen Description BLOOD BLOOD RIGHT HAND  Final   Special Requests   Final    BOTTLES DRAWN AEROBIC AND ANAEROBIC Blood Culture results may not be optimal due to an inadequate volume of blood received in culture bottles   Culture   Final    NO GROWTH 5 DAYS Performed at [facility], [address] [place], [place] [address]    Report Status 03/30/2024 FINAL  Final    Coagulation Studies: No results for input(s): LABPROT, INR in the last 72 hours.  Urinalysis: No results for  input(s): COLORURINE, LABSPEC, PHURINE, GLUCOSEU, HGBUR, BILIRUBINUR, KETONESUR, PROTEINUR, UROBILINOGEN, NITRITE, LEUKOCYTESUR in the last 72 hours.  Invalid input(s):  APPERANCEUR    Imaging: CT ABDOMEN PELVIS WO CONTRAST Result Date: 04/01/2024 CLINICAL DATA:  Concern for bowel obstruction. EXAM: CT ABDOMEN AND PELVIS WITHOUT CONTRAST TECHNIQUE: Multidetector CT imaging of the abdomen and pelvis was performed following the standard protocol without IV contrast. RADIATION DOSE REDUCTION: This exam was performed according to the departmental dose-optimization program which includes automated exposure control, adjustment of the mA and/or kV according to patient size and/or use of iterative reconstruction technique. COMPARISON:  CT abdomen pelvis dated 03/27/2024. FINDINGS: Evaluation of this exam is limited in the absence of intravenous contrast as well as due to respiratory motion and streak artifact caused by spinal hardware. Lower chest: Partially visualized moderate bilateral pleural effusions with partial compressive atelectasis of the lower lobes versus pneumonia. There is coronary vascular calcification. No intra-abdominal free air. Diffuse mesenteric edema and small ascites. Hepatobiliary: The liver is unremarkable. No biliary dilatation. The gallbladder is distended. Small amount of layering sludge or small stones in the gallbladder. Pancreas: The pancreas is poorly visualized but grossly unremarkable. Upper abdominal stranding, likely related to generalized edema. Correlation with pancreatic enzymes recommended to exclude acute pancreatitis. Spleen: Normal in size without focal abnormality. Adrenals/Urinary Tract: The adrenal glands unremarkable. There is a mild right hydronephrosis relatively similar to prior CT. Right ureteral stent with pigtail tip in the right renal pelvis and distal end in the urinary bladder. The left kidney is unremarkable. The urinary bladder is mildly  distended and grossly unremarkable. Air within the bladder likely introduced via recent a septation. Stomach/Bowel: There is no bowel obstruction. The appendix is not visualized with certainty. No inflammatory changes identified in the right lower quadrant. Vascular/Lymphatic: Advanced aortoiliac atherosclerotic disease. The IVC is unremarkable. No portal venous gas. There is no adenopathy. Reproductive: Hysterectomy.  No suspicious adnexal masses. Other: Diffuse subcutaneous edema and anasarca. Musculoskeletal: Osteopenia with degenerative changes of the spine. Extensive posterior fusion hardware. No acute osseous pathology. IMPRESSION: 1. No bowel obstruction. 2. Partially visualized moderate bilateral pleural effusions with partial compressive atelectasis of the lower lobes versus pneumonia. 3. Mild right hydronephrosis with a right ureteral stent in place. 4. Distended gallbladder with small amount of layering sludge or small stones. Correlation with pancreatic enzymes recommended to exclude acute pancreatitis. 5.  Aortic Atherosclerosis (ICD10-I70.0). Electronically Signed   By: Vanetta Chou M.D.   On: 04/01/2024 15:20   DG ABD ACUTE 2+V W 1V CHEST Result Date: 04/01/2024 CLINICAL DATA:  [id number] SBO (small bowel obstruction) (HCC) [id number] EXAM: DG ABDOMEN ACUTE WITH 1 VIEW CHEST COMPARISON:  March 31, 2024 FINDINGS: Similar gaseous distension of the small bowel and colon. No pneumoperitoneum. No organomegaly or radiopaque calculi. Right renal collecting system stent in unchanged position. No acute fracture or destructive lesion. Low lung volumes. Hazy attenuation of both lung bases, which may reflect small layering effusions and atelectasis.Thoracolumbar and sacroiliac fusion hardware again noted. IMPRESSION: 1. Similar diffuse gaseous distension throughout the small bowel and colon. 2. Low lung volumes. Hazy attenuation of both lung bases, which may reflect small layering effusions and atelectasis.  Electronically Signed   By: Rogelia Myers M.D.   On: 04/01/2024 08:24   DG Abd 2 Views Result Date: 03/31/2024 CLINICAL DATA:  [id number] Ileus [facility]) [id number] EXAM: ABDOMEN - 2 VIEW COMPARISON:  March 29, 2024 FINDINGS: Similar gaseous distention of the small bowel and colon. No pneumoperitoneum. No organomegaly or radiopaque calculi. Right ureteral stent in unchanged position. No acute fracture or destructive lesion. Unchanged lumbosacral fusion hardware. The lung bases are  clear. IMPRESSION: Similar gaseous distension throughout the small bowel and colon. The enteric contrast has now reached the rectum. Electronically Signed   By: Rogelia Myers M.D.   On: 03/31/2024 09:15   CT HEAD WO CONTRAST ( ) Result Date: 03/30/2024 CLINICAL DATA:  Mental status change, unknown cause EXAM: CT HEAD WITHOUT CONTRAST TECHNIQUE: Contiguous axial images were obtained from the base of the skull through the vertex without intravenous contrast. RADIATION DOSE REDUCTION: This exam was performed according to the departmental dose-optimization program which includes automated exposure control, adjustment of the mA and/or kV according to patient size and/or use of iterative reconstruction technique. COMPARISON:  CT head March 28, 2018. FINDINGS: Brain: Remote bilateral cerebellar infarcts. Remote appearing left frontal and left parieto-occipital infarcts. No evidence of acute large vascular territory infarct, acute hemorrhage, mass lesion, midline shift or hydrocephalus. Cerebral atrophy. Vascular: Calcific atherosclerosis. No hyperdense vessel identified. Skull: No acute fracture. Sinuses/Orbits: Clear sinuses.  No acute orbital findings. Other: Small left mastoid effusion. IMPRESSION: 1. No evidence of acute intracranial abnormality. 2. Remote appearing left frontal, left parieto-occipital, and bilateral cerebellar infarcts. MRI could provide more sensitive evaluation for acute infarct if clinically warranted. Electronically Signed    By: Gilmore GORMAN Molt M.D.   On: 03/30/2024 23:00     Medications:    sodium chloride  75 mL/hr at 04/01/24 0125   [MAR Hold] potassium chloride  10 mEq (04/01/24 1541)    [MAR Hold] brimonidine   1 drop Both Eyes BID   [MAR Hold] Chlorhexidine  Gluconate Cloth  6 each Topical Daily   [MAR Hold] diltiazem   300 mg Oral Daily   [MAR Hold] enoxaparin  (LOVENOX ) injection  30 mg Subcutaneous Q24H   feeding supplement  1 Container Oral TID BM   [MAR Hold] hydrALAZINE   50 mg Oral Q8H   [MAR Hold] insulin  aspart  0-9 Units Subcutaneous TID WC   [MAR Hold] metoCLOPramide   5 mg Oral BID   [MAR Hold] metoprolol  tartrate  50 mg Oral BID   [START ON 04/02/2024] multivitamin with minerals  1 tablet Oral Daily   [MAR Hold] pantoprazole   40 mg Oral Daily   [MAR Hold] polyethylene glycol  17 g Oral Daily   [MAR Hold] rosuvastatin   10 mg Oral Daily   [MAR Hold] sodium bicarbonate   1,300 mg Oral BID   [MAR Hold] sodium chloride  flush  3 mL Intravenous Q12H   [MAR Hold] sodium phosphate   1 enema Rectal Daily   [START ON 04/02/2024] thiamine   100 mg Oral Daily   [MAR Hold] acetaminophen  **OR** [MAR Hold] acetaminophen , [MAR Hold] albuterol , [MAR Hold] bisacodyl , [MAR Hold] docusate sodium , [MAR Hold] hydrALAZINE , [MAR Hold] ondansetron  **OR** [MAR Hold] ondansetron  (ZOFRAN ) IV, [MAR Hold] phenol  Assessment/ Plan:  83 y.o. female with past history of hypertension, diabetes mellitus type 2, rheumatoid arthritis, glaucoma, GERD, COPD, history of colon cancer who presented with ESBL UTI, generalized weakness.  1.  Acute kidney injury/chronic kidney disease stage IV.  The patient's renal function appears to be worse now.  eGFR currently 11.  In the past she has required dialysis.  Patient currently unable to make her own decision.  We had a long discussion with the patient's husband and he wishes for an attempt at short-term dialysis to see if this improves her sensorium.  Therefore we will plan for a trial course at  this time with low threshold to stop.  2.  Anemia of chronic kidney disease.  Hemoglobin currently 8.6.  Consider Epogen  if patient continues with  dialysis.  3.  Acute metabolic acidosis.  Serum bicarbonate currently 18.  Should improve with dialysis treatments.   LOS: 10 Toryn Dewalt 7/2/20254:41 PM

## 2024-04-01 NOTE — Progress Notes (Signed)
 OT Cancellation Note  Patient Details Name: Stephanie [name] MRN: [id number] DOB: [birth year]   Cancelled Treatment:    Reason Eval/Treat Not Completed: Medical issues which prohibited therapy. Discussed with PT. Pt unable to participate in therapy at this time with decreased level of arousal and inability to follow commands. CT ordered. OT will hold at this time and re-attempt when able.   Courtnay Petrilla L. Klayton Monie, OTR/L  04/01/24, 12:29 PM

## 2024-04-01 NOTE — Interval H&P Note (Signed)
 History and Physical Interval Note:  04/01/2024 6:15 PM  Stephanie [name]  has presented today for surgery, with the diagnosis of ESRD.  The various methods of treatment have been discussed with the patient and family. After consideration of risks, benefits and other options for treatment, the patient has consented to  Procedure(s): TEMPORARY DIALYSIS CATHETER (N/A) as a surgical intervention.  The patient's history has been reviewed, patient examined, no change in status, stable for surgery.  I have reviewed the patient's chart and labs.  Questions were answered to the patient's satisfaction.     [name]

## 2024-04-01 NOTE — Op Note (Signed)
  OPERATIVE NOTE   PROCEDURE: Insertion of temporary dialysis catheter catheter right common femoral approach.  PRE-OPERATIVE DIAGNOSIS: Acute on chronic renal insufficiency  POST-OPERATIVE DIAGNOSIS: Acute on chronic renal insufficiency  SURGEON: Cordella KANDICE Shawl M.D.  ANESTHESIA: 1% lidocaine  local infiltration  ESTIMATED BLOOD LOSS: Minimal cc  INDICATIONS:   Stephanie [name] is a 83 y.o. female who presents with worsening of her renal function.  She has reached a point that she requires hemodialysis.  Risk and benefits for catheter placement open reviewed all questions answered all are in agreement with proceeding..  DESCRIPTION: After obtaining full informed written consent, the patient was positioned supine. The right groin was prepped and draped in a sterile fashion. Ultrasound was placed in a sterile sleeve. Ultrasound was utilized to identify the right common femoral vein which is noted to be echolucent and compressible indicating patency. Images recorded for the permanent record. Under real-time visualization a Seldinger needle is inserted into the vein and the guidewires advanced without difficulty. Small counterincision was made at the wire insertion site. Dilator is passed over the wire and the temporary dialysis catheter catheter is fed over the wire without difficulty.  All lumens aspirate and flush easily and are packed with heparin  saline. Catheter secured to the skin of the right thigh with 2-0 silk. A sterile dressing is applied with Biopatch.  COMPLICATIONS: None  CONDITION: Unchanged  [name] Office:  [phone] 04/01/2024, 6:16 PM

## 2024-04-01 NOTE — H&P (View-Only) (Signed)
 Hospital Consult    Reason for Consult:  Placement of temporary dialysis catheter  Requesting Physician:  Dr Pershing Sherry MD MRN #:  [id number]  History of Present Illness: This is a 83 y.o. 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  She presents to [facility] emergency department on 03/22/24 with generalized weakness.  Upon workup in the emergency department she was deemed to have sepsis from UTI.  She had white blood cell count of 18 and a lactate of 2.  She had chronic retention with her Foley and she has a UPJ stent.  The Foley catheter was exchanged at the time the urine looked purulent.  Upon exam today the patient is resting comfortably in bed with patient's husband at the bedside as well as other family members.  Patient is not responding well to my questions or following commands.  Her BUN today is 66 and her creatinine is 3.79 with a GFR of 11.  Vascular surgery was consulted for placement of a temporary dialysis catheter at this time.  Past Medical History:  Diagnosis Date   Anemia    Aortic atherosclerosis (HCC)    Arthritis    Asthma    CKD (chronic kidney disease), stage IV (HCC)    Colon cancer (HCC) 1996   Complication of anesthesia    a.) PONV   Diabetes mellitus without complication (HCC)    Dyspnea    Dysrhythmia    Foley catheter in place    GERD (gastroesophageal reflux disease)    Glaucoma    Hydronephrosis of right kidney    Hyperlipidemia    Hyperparathyroidism due to renal insufficiency (HCC)    Hypertension    Lumbar disc disease    Pneumonia    PONV (postoperative nausea and vomiting)    RBBB (right bundle branch block)    Rheumatoid arthritis involving multiple sites with positive rheumatoid factor (HCC)    Small bowel obstruction (HCC)    Urinary retention    Uses walker    Vitamin B 12 deficiency    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    has, does not wear    Past Surgical History:  Procedure  Laterality Date   ABDOMINAL HYSTERECTOMY     BACK SURGERY  2006   x5  screws and rods   BREAST BIOPSY Right 11/01/2021   rt br biopsy calcs ribbon clip/ benign 2nd site LIQ   BREAST BIOPSY Right 11/01/2021   rt br stereo calcs coil clip 1st site / benign UIQ   COLON SURGERY     COLONOSCOPY WITH PROPOFOL  N/A 05/22/2017   Procedure: COLONOSCOPY WITH PROPOFOL ;  Surgeon: Viktoria Lamar DASEN, MD;  Location: [facility] ENDOSCOPY;  Service: Endoscopy;  Laterality: N/A;   COLONOSCOPY WITH PROPOFOL  N/A 06/08/2021   Procedure: COLONOSCOPY WITH PROPOFOL ;  Surgeon: Jinny Carmine, MD;  Location: [facility];  Service: Endoscopy;  Laterality: N/A;  Diabetic   CYSTOSCOPY W/ RETROGRADES Right 11/06/2022   Procedure: CYSTOSCOPY WITH RETROGRADE PYELOGRAM;  Surgeon: Twylla Glendia BROCKS, MD;  Location: [facility] ORS;  Service: Urology;  Laterality: Right;   CYSTOSCOPY W/ URETERAL STENT PLACEMENT  07/25/2021   Procedure: CYSTOSCOPY WITH RETROGRADE PYELOGRAM/URETERAL STENT PLACEMENT;  Surgeon: Twylla Glendia BROCKS, MD;  Location: [facility] ORS;  Service: Urology;;   CYSTOSCOPY W/ URETERAL STENT PLACEMENT  02/06/2022   Procedure: CYSTOSCOPY WITH RETROGRADE PYELOGRAM/URETERAL STENT EXCHANGE;  Surgeon: Twylla Glendia BROCKS, MD;  Location: [facility] ORS;  Service: Urology;;   PHYLLIS  W/ URETERAL STENT PLACEMENT Right 11/06/2022   Procedure: CYSTOSCOPY WITH STENT EXCHANGE;  Surgeon: Twylla Glendia BROCKS, MD;  Location: [facility] ORS;  Service: Urology;  Laterality: Right;   CYSTOSCOPY W/ URETERAL STENT PLACEMENT Right 12/03/2023   Procedure: CYSTOSCOPY WITH STENT EXCHANGE;  Surgeon: Twylla Glendia BROCKS, MD;  Location: [facility] ORS;  Service: Urology;  Laterality: Right;   CYSTOSCOPY WITH STENT PLACEMENT Right 08/02/2020   Procedure: CYSTOSCOPY WITH STENT EXCHANGE;  Surgeon: Twylla Glendia BROCKS, MD;  Location: [facility] ORS;  Service: Urology;  Laterality: Right;   CYSTOSCOPY WITH STENT PLACEMENT Right 06/18/2020   Procedure: CYSTOSCOPY WITH STENT PLACEMENT;  Surgeon:  Watt Rush, MD;  Location: [facility] ORS;  Service: Urology;  Laterality: Right;   EYE SURGERY Bilateral    Cataracts   FRACTURE SURGERY Right 2011   foot surgery   JOINT REPLACEMENT Bilateral    knees   KNEE ARTHROSCOPY Bilateral    LAPAROTOMY N/A 06/30/2020   Procedure: EXPLORATORY LAPAROTOMY;  Surgeon: Jordis Laneta FALCON, MD;  Location: [facility] ORS;  Service: General;  Laterality: N/A;   LUMBAR LAMINECTOMY     POLYPECTOMY  06/08/2021   Procedure: POLYPECTOMY;  Surgeon: Jinny Carmine, MD;  Location: [facility];  Service: Endoscopy;;   TEMPORARY DIALYSIS CATHETER N/A 06/27/2020   Procedure: TEMPORARY DIALYSIS CATHETER;  Surgeon: Jama Cordella MATSU, MD;  Location: [facility] INVASIVE CV LAB;  Service: Cardiovascular;  Laterality: N/A;    Allergies  Allergen Reactions   Remicade [Infliximab] Hives, Shortness Of Breath and Itching   Ibuprofen Itching   Indomethacin Other (See Comments)    headache   Methotrexate Derivatives Other (See Comments)    Headache   Moexipril Other (See Comments)    unknown   Percocet [Oxycodone -Acetaminophen ] Itching   Naprosyn [Naproxen] Rash    Prior to Admission medications   Medication Sig Start Date End Date Taking? Authorizing Provider  acetaminophen  (TYLENOL ) 325 MG tablet Take 2 tablets (650 mg total) by mouth every 6 (six) hours as needed for moderate pain, fever or headache. 07/10/20  Yes Fausto Sor A, DO  ascorbic acid  (VITAMIN C) 500 MG tablet Take 1 tablet (500 mg total) by mouth daily. 07/10/20  Yes Fausto Sor A, DO  brimonidine  (ALPHAGAN ) 0.2 % ophthalmic solution Place 1 drop into both eyes 2 (two) times daily. 05/31/20  Yes [provider]  Cranberry 400 MG CAPS Take 400 mg by mouth daily.  03/24/20  Yes [provider]  diltiazem  (CARDIZEM  CD) 240 MG 24 hr capsule Take 240 mg by mouth daily. 11/06/23  Yes [provider]  glipiZIDE  (GLUCOTROL ) 10 MG tablet Take 10 mg by mouth daily before breakfast. 02/05/24  Yes  [provider]  Iron -Vitamin C (VITRON-C) 65-125 MG TABS Take 1 tablet by mouth daily.   Yes [provider]  leflunomide  (ARAVA ) 10 MG tablet Take 10 mg by mouth daily.   Yes [provider]  metoCLOPramide  (REGLAN ) 5 MG tablet SMARTSIG:1 Tablet(s) By Mouth Morning-Night 01/31/24  Yes [provider]  metoprolol  tartrate (LOPRESSOR ) 50 MG tablet Take 50 mg by mouth 2 (two) times daily.   Yes [provider]  omeprazole (PRILOSEC) 40 MG capsule Take 40 mg by mouth daily. 09/11/20  Yes [provider]  Probiotic Product (PROBIOTIC-10 PO) Take 1 capsule by mouth daily.   Yes [provider]  rosuvastatin  (CRESTOR ) 10 MG tablet Take 10 mg by mouth daily.  03/24/20  Yes [provider]  sulfaSALAzine (AZULFIDINE) 500 MG EC tablet Take  500 mg by mouth daily. 02/26/24 05/26/24 Yes [provider]  torsemide  (DEMADEX ) 10 MG tablet Take 10 mg by mouth daily. One in morning and one in the evening 11/26/22  Yes [provider]  vitamin B-12 (CYANOCOBALAMIN ) 1000 MCG tablet Take 1,000 mcg by mouth daily.   Yes [provider]  VITAMIN D, CHOLECALCIFEROL, PO Take 1 capsule by mouth daily.   Yes [provider]  albuterol  (VENTOLIN  HFA) 108 (90 Base) MCG/ACT inhaler Inhale 1-2 puffs into the lungs every 6 (six) hours as needed for wheezing or shortness of breath. Patient not taking: Reported on 03/22/2024    [provider]  CONTOUR TEST test strip 1 each by Other route daily.  03/09/18   [provider]  fluticasone -salmeterol (ADVAIR) 100-50 MCG/ACT AEPB Inhale 1 puff into the lungs 2 (two) times daily. Patient not taking: Reported on 03/22/2024    [provider]  Magnesium  Oxide 250 MG TABS Take 250 mg by mouth 2 (two) times daily. Patient not taking: Reported on 03/22/2024    [provider]  triamcinolone  (KENALOG ) 0.025 % ointment Apply 1 application. topically 2 (two)  times daily. Patient not taking: Reported on 03/22/2024 12/22/21   Helon Clotilda LABOR, PA-C    Social History   Socioeconomic History   Marital status: Married    Spouse name: [name]   Number of children: Not on file   Years of education: Not on file   Highest education level: Not on file  Occupational History   Not on file  Tobacco Use   Smoking status: Never   Smokeless tobacco: Never  Vaping Use   Vaping status: Never Used  Substance and Sexual Activity   Alcohol use: No   Drug use: No   Sexual activity: Yes  Other Topics Concern   Not on file  Social History Narrative   Not on file   Social Drivers of Health   Financial Resource Strain: Low Risk  (09/05/2023)   Received from [facility]   Overall Financial Resource Strain (CARDIA)    Difficulty of Paying Living Expenses: Not hard at all  Food Insecurity: No Food Insecurity (03/22/2024)   Hunger Vital Sign    Worried About Running Out of Food in the Last Year: Never true    Ran Out of Food in the Last Year: Never true  Transportation Needs: No Transportation Needs (03/22/2024)   PRAPARE - Administrator, Civil Service (Medical): No    Lack of Transportation (Non-Medical): No  Physical Activity: Not on file  Stress: Not on file  Social Connections: Patient Declined (03/22/2024)   Social Connection and Isolation Panel    Frequency of Communication with Friends and Family: Patient declined    Frequency of Social Gatherings with Friends and Family: Patient declined    Attends Religious Services: Patient declined    Database administrator or Organizations: Patient declined    Attends Banker Meetings: Patient declined    Marital Status: Patient declined  Intimate Partner Violence: Not At Risk (03/22/2024)   Humiliation, Afraid, Rape, and Kick questionnaire    Fear of Current or Ex-Partner: No    Emotionally Abused: No    Physically Abused: No    Sexually Abused: No      Family History  Problem Relation Age of Onset   Breast cancer Neg Hx     ROS: Otherwise negative unless mentioned in HPI  Physical Examination  Vitals:  04/01/24 0830 04/01/24 1044  BP: (!) 158/69 134/64  Pulse: (!) 57 (!) 51  Resp: 18 16  Temp:    SpO2: 97% 95%   Body mass index is 24.69 kg/m.  General:  WDWN in NAD Gait: Not observed HENT: WNL, normocephalic Pulmonary: normal non-labored breathing, without Rales, rhonchi,  wheezing Cardiac: irregular, history of atrial fibrillation but not on anticoagulation.  History of atrial fibrillation not on anticoagulation, without  Murmurs, rubs or gallops; without carotid bruits Abdomen: Positive bowel sounds throughout, soft, NT/ND, no masses Skin: without rashes Vascular Exam/Pulses: Palpable pulses throughout.  All extremities warm to touch. Extremities: without ischemic changes, without Gangrene , without cellulitis; without open wounds;  Musculoskeletal: no muscle wasting or atrophy  Neurologic: A&O X 3;  No focal weakness or paresthesias are detected; speech is fluent/normal Psychiatric:  The pt has Normal affect. Lymph:  Unremarkable  CBC    Component Value Date/Time   WBC 12.7 (H) 04/01/2024 0439   RBC 2.98 (L) 04/01/2024 0439   HGB 8.6 (L) 04/01/2024 0439   HGB 9.3 (L) 03/03/2024 1010   HGB 10.4 (L) 01/14/2015 0457   HCT 27.4 (L) 04/01/2024 0439   HCT 33.7 (L) 06/23/2014 0857   PLT 290 04/01/2024 0439   PLT 256 03/03/2024 1010   PLT 167 01/14/2015 0457   MCV 91.9 04/01/2024 0439   MCV 92 06/23/2014 0857   MCH 28.9 04/01/2024 0439   MCHC 31.4 04/01/2024 0439   RDW 15.1 04/01/2024 0439   RDW 13.6 06/23/2014 0857   LYMPHSABS 1.2 03/22/2024 0850   LYMPHSABS 1.9 12/01/2013 1133   MONOABS 2.6 (H) 03/22/2024 0850   MONOABS 0.6 12/01/2013 1133   EOSABS 0.0 03/22/2024 0850   EOSABS 0.3 12/01/2013 1133   BASOSABS 0.1 03/22/2024 0850   BASOSABS 0.1 12/01/2013 1133    BMET    Component Value Date/Time    NA 134 (L) 04/01/2024 0439   NA 132 (L) 01/14/2015 0457   K 3.4 (L) 04/01/2024 0439   K 3.9 01/14/2015 0457   CL 106 04/01/2024 0439   CL 100 (L) 01/14/2015 0457   CO2 18 (L) 04/01/2024 0439   CO2 27 01/14/2015 0457   GLUCOSE 181 (H) 04/01/2024 0439   GLUCOSE 189 (H) 01/14/2015 0457   BUN 66 (H) 04/01/2024 0439   BUN 12 01/14/2015 0457   CREATININE 3.79 (H) 04/01/2024 0439   CREATININE 2.80 (H) 01/07/2024 0938   CREATININE 0.66 01/14/2015 0457   CALCIUM  8.0 (L) 04/01/2024 0439   CALCIUM  8.7 (L) 01/14/2015 0457   GFRNONAA 11 (L) 04/01/2024 0439   GFRNONAA 16 (L) 01/07/2024 0938   GFRNONAA >60 01/14/2015 0457   GFRAA 15 (L) 07/05/2020 0449   GFRAA >60 01/14/2015 0457    COAGS: Lab Results  Component Value Date   INR 1.0 07/25/2021   INR 1.6 (H) 06/27/2020   INR 1.8 (H) 06/26/2020     Non-Invasive Vascular Imaging:   EXAM:03/22/24 CT ABDOMEN AND PELVIS WITHOUT CONTRAST   TECHNIQUE: Multidetector CT imaging of the abdomen and pelvis was performed following the standard protocol without IV contrast.   RADIATION DOSE REDUCTION: This exam was performed according to the departmental dose-optimization program which includes automated exposure control, adjustment of the mA and/or kV according to patient size and/or use of iterative reconstruction technique.   COMPARISON:  10/19/2022   FINDINGS: Lower chest: No pleural fluid or consolidative change. Small nodules within the left base are similar measuring up to 4 mm, image 10/4.  Hepatobiliary: No focal liver abnormality. Gallbladder appears normal. Pancreas is suboptimally visualized.   Pancreas: Atrophic appearance of the body and tail of pancreas as before. No signs of inflammation or mass.   Spleen: Normal in size without focal abnormality.   Adrenals/Urinary Tract: Normal adrenal glands. There is a right-sided nephroureteral stent in place. Right-sided pelviectasis is identified. The right renal pelvis  measures 2.2 cm on today's study versus 3.2 cm on the previous exam. No signs of high-grade obstructive uropathy. Left kidney is unremarkable. Urinary bladder is decompressed around a Foley catheter balloon. Distal portion of the right ureteral stent is in the decompressed bladder.   Stomach/Bowel: Stomach appears within normal limits. No pathologic dilatation of the large or small bowel loops. The appendix is not confidently identified. No pericecal inflammation. There is a moderate stool burden identified within the ascending and proximal transverse colon. Large volume of desiccated stool is noted within the distended rectum. There is gaseous distension of the left colon proximal to this level.   Vascular/Lymphatic: Aortic atherosclerosis without aneurysm. No signs of abdominopelvic adenopathy.   Reproductive: Status post hysterectomy. No adnexal masses.   Other: No free fluid or fluid collections. No signs of pneumoperitoneum.   Musculoskeletal: Postoperative changes identified within the lower thoracic, lumbar spine and sacrum. On multilevel lumbar spondylosis noted. No acute osseous pathology.   IMPRESSION: 1. No acute findings within the abdomen or pelvis. 2. Right-sided nephroureteral stent in place. Right-sided pelviectasis is identified. The right renal pelvis measures 2.2 cm on today's study versus 3.2 cm on the previous exam. No signs of high-grade obstructive uropathy. 3. Large volume of desiccated stool is noted within the distended rectum. There is gaseous distension of the left colon proximal to this level. Correlate for any clinical signs or symptoms of constipation or fecal impaction. 4. Small nodules within the left base are similar measuring up to 4 mm. 5.  Aortic Atherosclerosis (ICD10-I70.0).  Statin:  Yes.   Beta Blocker:  Yes.   Aspirin:  No. ACEI:  No. ARB:  No. CCB use:  Yes Other antiplatelets/anticoagulants:  No.    ASSESSMENT/PLAN: This is  a 83 y.o. female who presents to [facility] emergency department with overall generalized weakness.  She has not been eating well and upon admission complained she was not feeling well.  She was noted to be septic from UTI.  She has a history of chronic kidney disease now with acute kidney injury.  Vascular surgery was consulted to evaluate.  Vascular surgery will take the patient to the vascular lab later today on 04/01/2024 for placement of a temporary dialysis catheter for hemodialysis.  Patient, husband and multiple family members at the bedside today.  I discussed in detail for the group the procedure, benefits, risk, complications.  Patient's husband verbalizes his understanding and wishes to proceed for his wife.  I answered all of his and the family's questions today.  Patient has not eaten anything today.  Patient currently is not on any anticoagulation.   -I discussed the case with Dr. [name] MD and he agrees with the plan.   Gwendlyn JONELLE Shank [facility] 04/01/2024 11:36 AM

## 2024-04-01 NOTE — TOC Progression Note (Signed)
 Transition of Care [facility]) - Progression Note    Patient Details  Name: Stephanie [name] MRN: [id number] Date of Birth: [birth year]  [facility] CM/SW Contact  [place] L Tamiya [place], [place] Phone Number: 04/01/2024, 9:44 AM  Clinical Narrative:     CSW attempted to meet with patient at bedside. Patient was asleep. The grand daughter was visiting. Spouse is on the way. Readmission screen complete.   CSW contacted spouse to confirm readmission screen information. Spouse agreed to accept bed offer at [facility]. Therisa, admissions coordinator advised that family is accepting bed offer. Therisa advised of preference for patient to be discharged today or tomorrow as Friday is a holiday and pharmacy may not be open to process prescriptions.    Expected Discharge Plan: Skilled Nursing Facility Barriers to Discharge: Continued Medical Work up  Expected Discharge Plan and Services     Post Acute Care Choice: Skilled Nursing Facility Living arrangements for the past 2 months: Single Family Home                                       Social Determinants of Health (SDOH) Interventions SDOH Screenings   Food Insecurity: No Food Insecurity (03/22/2024)  Housing: High Risk (03/22/2024)  Transportation Needs: No Transportation Needs (03/22/2024)  Utilities: Not At Risk (03/22/2024)  Depression (PHQ2-9): Low Risk  (03/27/2023)  Financial Resource Strain: Low Risk  (09/05/2023)   Received from [facility]  Social Connections: Patient Declined (03/22/2024)  Tobacco Use: Low Risk  (03/22/2024)  Recent Concern: Tobacco Use - Medium Risk (02/26/2024)   Received from [facility]    Readmission Risk Interventions    04/01/2024    9:35 AM 03/24/2024    4:33 PM 05/02/2022   10:33 AM  Readmission Risk Prevention Plan  Transportation Screening Complete    PCP or Specialist Appt within 3-5 Days  Complete   Social Work Consult for Recovery Care Planning/Counseling   Complete   Palliative Care Screening  Not Applicable Complete  Medication Review Oceanographer) Complete    PCP or Specialist appointment within 3-5 days of discharge Complete    HRI or Home Care Consult Complete    SW Recovery Care/Counseling Consult Complete    Palliative Care Screening Not Applicable    Skilled Nursing Facility Complete

## 2024-04-02 ENCOUNTER — Encounter: Payer: Self-pay | Admitting: Vascular Surgery

## 2024-04-02 DIAGNOSIS — Z7189 Other specified counseling: Secondary | ICD-10-CM

## 2024-04-02 DIAGNOSIS — A419 Sepsis, unspecified organism: Secondary | ICD-10-CM | POA: Diagnosis not present

## 2024-04-02 DIAGNOSIS — N390 Urinary tract infection, site not specified: Secondary | ICD-10-CM | POA: Diagnosis not present

## 2024-04-02 LAB — BASIC METABOLIC PANEL WITH GFR
Anion gap: 7 (ref 5–15)
BUN: 61 mg/dL — ABNORMAL HIGH (ref 8–23)
CO2: 19 mmol/L — ABNORMAL LOW (ref 22–32)
Calcium: 7.8 mg/dL — ABNORMAL LOW (ref 8.9–10.3)
Chloride: 107 mmol/L (ref 98–111)
Creatinine, Ser: 3.66 mg/dL — ABNORMAL HIGH (ref 0.44–1.00)
GFR, Estimated: 12 mL/min — ABNORMAL LOW (ref >60)
Glucose, Bld: 171 mg/dL — ABNORMAL HIGH (ref 70–99)
Potassium: 3.8 mmol/L (ref 3.5–5.1)
Sodium: 133 mmol/L — ABNORMAL LOW (ref 135–145)

## 2024-04-02 LAB — GLUCOSE, CAPILLARY
Glucose-Capillary: 149 mg/dL — ABNORMAL HIGH (ref 70–99)
Glucose-Capillary: 173 mg/dL — ABNORMAL HIGH (ref 70–99)
Glucose-Capillary: 183 mg/dL — ABNORMAL HIGH (ref 70–99)
Glucose-Capillary: 139 mg/dL — ABNORMAL HIGH (ref 70–99)

## 2024-04-02 LAB — HEPATITIS B SURFACE ANTIGEN: Hepatitis B Surface Ag: NONREACTIVE

## 2024-04-02 LAB — CBC
HCT: 26.8 % — ABNORMAL LOW (ref 36.0–46.0)
Hemoglobin: 8.5 g/dL — ABNORMAL LOW (ref 12.0–15.0)
MCH: 29.2 pg (ref 26.0–34.0)
MCHC: 31.7 g/dL (ref 30.0–36.0)
MCV: 92.1 fL (ref 80.0–100.0)
Platelets: 264 10*3/uL (ref 150–400)
RBC: 2.91 MIL/uL — ABNORMAL LOW (ref 3.87–5.11)
RDW: 15.2 % (ref 11.5–15.5)
WBC: 13.3 10*3/uL — ABNORMAL HIGH (ref 4.0–10.5)
nRBC: 0 % (ref 0.0–0.2)

## 2024-04-02 LAB — MAGNESIUM: Magnesium: 2 mg/dL (ref 1.7–2.4)

## 2024-04-02 LAB — PHOSPHORUS: Phosphorus: 5.9 mg/dL — ABNORMAL HIGH (ref 2.5–4.6)

## 2024-04-02 MED ORDER — HEPARIN SODIUM (PORCINE) 1000 UNIT/ML IJ SOLN
INTRAMUSCULAR | Status: AC
Start: 2024-04-02 — End: 2024-04-02
  Filled 2024-04-02: qty 10

## 2024-04-02 MED ORDER — CHLORHEXIDINE GLUCONATE CLOTH 2 % EX PADS
6.0000 | MEDICATED_PAD | Freq: Every day | CUTANEOUS | Status: DC
  Administered 2024-04-02 – 2024-04-05 (×4): 6 via TOPICAL

## 2024-04-02 MED ORDER — HYDRALAZINE HCL 20 MG/ML IJ SOLN
20.0000 mg | INTRAMUSCULAR | Status: DC | PRN
  Administered 2024-04-02 – 2024-04-03 (×5): 20 mg via INTRAVENOUS
  Filled 2024-04-02 (×6): qty 1

## 2024-04-02 NOTE — Progress Notes (Signed)
 patient unable to tolerate her oral medication due to being so lethargic.  She needs sternal rub in order to arouse, doesn't stay awake for long. Dr. Massie

## 2024-04-02 NOTE — Progress Notes (Signed)
  Received patient in bed to unit.   Informed consent signed and in chart.    TX duration: 2hrs  Pt to unit unable to answer questions and only responding to my voice with eyes opening.  Post tx pts eyes remaining open and able to ask what am I doing     Transported back to floor  Hand-off given to patient's nurse.    Access used: R Femoral catheter Access issues: none.  Dressing changed    Total UF removed: 0 Medication(s) given: none Post HD VS: wnl Post HD weight:      Olivia Hurst LPN [facility]

## 2024-04-02 NOTE — Progress Notes (Signed)
 [facility]  ROUNDING NOTE   Subjective:   We were asked to see the patient for evaluation management of acute kidney injury in the setting of known chronic kidney disease stage IV. She came in on 03/22/2024 with generalized weakness. She has history of sepsis from UTI with history of ESBL UTI.  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  Update:  Overall renal function remains low. Creatinine currently 3.6. Right femoral temporary dialysis catheter placed.  Objective:  Vital signs in last 24 hours:  Temp:  [97.3 F (36.3 C)-97.4 F (36.3 C)] 97.3 F (36.3 C) (07/03 0831) Pulse Rate:  [54-59] 56 (07/03 0831) Resp:  [14-20] 16 (07/03 0831) BP: (146-167)/(59-65) 155/65 (07/03 0831) SpO2:  [93 %-96 %] 96 % (07/03 0831) Weight:  [77.6 kg] 77.6 kg (07/03 0500)  Weight change:  Filed Weights   03/22/24 1000 03/22/24 1414 04/02/24 0500  Weight: 69.4 kg 69.4 kg 77.6 kg    Intake/Output: I/O last 3 completed shifts: In: 2899.7 [P.O.:860; I.V.:1971.2; IV Piggyback:68.5] Out: 825 [Urine:825]   Intake/Output this shift:  No intake/output data recorded.  Physical Exam: General: No acute distress  Head: Normocephalic, atraumatic. Moist oral mucosal membranes  Neck: Supple  Lungs:  Clear to auscultation, normal effort  Heart: S1S2 no rubs  Abdomen:  Soft, nontender, bowel sounds present  Extremities: 1+ peripheral edema.  Neurologic: Lethargic, difficult to arouse  Skin: No acute rash  Access: Right femoral temporary hemodialysis catheter     Basic Metabolic Panel: Recent Labs  Lab 03/27/24 0439 03/28/24 0403 03/29/24 0853 03/30/24 0427 03/31/24 0404 04/01/24 0439 04/02/24 0404  NA 135 134* 138 136 136 134* 133*  K 3.2* 3.0* 3.0* 3.3* 3.6 3.4* 3.8  CL 102 105 107 105 107 106 107  CO2 21* 16* 15* 16* 20* 18* 19*  GLUCOSE 145* 114* 121* 213* 199* 181* 171*  BUN 61* 67* 75* 72* 74* 66* 61*  CREATININE 3.91* 4.00* 4.67*  4.32* 4.11* 3.79* 3.66*  CALCIUM  8.0* 7.7* 8.0* 8.0* 7.7* 8.0* 7.8*  MG 2.7* 2.7*  --   --   --  2.1 2.0  PHOS  --   --   --   --   --   --  5.9*    Liver Function Tests: No results for input(s): AST, ALT, ALKPHOS, BILITOT, PROT, ALBUMIN  in the last 168 hours. No results for input(s): LIPASE, AMYLASE in the last 168 hours. No results for input(s): AMMONIA in the last 168 hours.  CBC: Recent Labs  Lab 03/29/24 0853 03/30/24 0427 03/31/24 0404 04/01/24 0439 04/02/24 0805  WBC 14.0* 12.2* 11.9* 12.7* 13.3*  HGB 9.1* 9.2* 8.5* 8.6* 8.5*  HCT 29.9* 30.9* 27.0* 27.4* 26.8*  MCV 96.1 96.3 92.5 91.9 92.1  PLT 428* 324 274 290 264    Cardiac Enzymes: No results for input(s): CKTOTAL, CKMB, CKMBINDEX, TROPONINI in the last 168 hours.  BNP: Invalid input(s): POCBNP  CBG: Recent Labs  Lab 04/01/24 1220 04/01/24 1823 04/01/24 2150 04/02/24 0829 04/02/24 1125  GLUCAP 166* 149* 158* 173* 183*    Microbiology: Results for orders placed or performed during the hospital encounter of 03/22/24  Resp panel by RT-PCR (RSV, Flu A&B, Covid) Anterior Nasal Swab     Status: None   Collection Time: 03/22/24  8:50 AM   Specimen: Anterior Nasal Swab  Result Value Ref Range Status   SARS Coronavirus 2 by RT PCR NEGATIVE NEGATIVE Final    Comment: (NOTE)  SARS-CoV-2 target nucleic acids are NOT DETECTED.  The SARS-CoV-2 RNA is generally detectable in upper respiratory specimens during the acute phase of infection. The lowest concentration of SARS-CoV-2 viral copies this assay can detect is 138 copies/mL. A negative result does not preclude SARS-Cov-2 infection and should not be used as the sole basis for treatment or other patient management decisions. A negative result may occur with  improper specimen collection/handling, submission of specimen other than nasopharyngeal swab, presence of viral mutation(s) within the areas targeted by this assay, and  inadequate number of viral copies(<138 copies/mL). A negative result must be combined with clinical observations, patient history, and epidemiological information. The expected result is Negative.  Fact Sheet for Patients:  BloggerCourse.com  Fact Sheet for Healthcare Providers:  SeriousBroker.it  This test is no t yet approved or cleared by the [place]  FDA and  has been authorized for detection and/or diagnosis of SARS-CoV-2 by FDA under an Emergency Use Authorization (EUA). This EUA will remain  in effect (meaning this test can be used) for the duration of the COVID-19 declaration under Section 564(b)(1) of the Act, 21 U.S.C.section 360bbb-3(b)(1), unless the authorization is terminated  or revoked sooner.       Influenza A by PCR NEGATIVE NEGATIVE Final   Influenza B by PCR NEGATIVE NEGATIVE Final    Comment: (NOTE) The Xpert Xpress SARS-CoV-2/FLU/RSV plus assay is intended as an aid in the diagnosis of influenza from Nasopharyngeal swab specimens and should not be used as a sole basis for treatment. Nasal washings and aspirates are unacceptable for Xpert Xpress SARS-CoV-2/FLU/RSV testing.  Fact Sheet for Patients: BloggerCourse.com  Fact Sheet for Healthcare Providers: SeriousBroker.it  This test is not yet approved or cleared by the [place]  FDA and has been authorized for detection and/or diagnosis of SARS-CoV-2 by FDA under an Emergency Use Authorization (EUA). This EUA will remain in effect (meaning this test can be used) for the duration of the COVID-19 declaration under Section 564(b)(1) of the Act, 21 U.S.C. section 360bbb-3(b)(1), unless the authorization is terminated or revoked.     Resp Syncytial Virus by PCR NEGATIVE NEGATIVE Final    Comment: (NOTE) Fact Sheet for Patients: BloggerCourse.com  Fact Sheet for Healthcare  Providers: SeriousBroker.it  This test is not yet approved or cleared by the [place]  FDA and has been authorized for detection and/or diagnosis of SARS-CoV-2 by FDA under an Emergency Use Authorization (EUA). This EUA will remain in effect (meaning this test can be used) for the duration of the COVID-19 declaration under Section 564(b)(1) of the Act, 21 U.S.C. section 360bbb-3(b)(1), unless the authorization is terminated or revoked.  Performed at [facility], [address], [place], [place] [address]   Urine Culture (for pregnant, neutropenic or urologic patients or patients with an indwelling urinary catheter)     Status: Abnormal   Collection Time: 03/22/24  8:50 AM   Specimen: Urine, Catheterized  Result Value Ref Range Status   Specimen Description   Final    URINE, CATHETERIZED Performed at [facility], [address] [place], [place] [address]    Special Requests   Final    NONE Performed at [facility], [address] [place], [place] [address]    Culture >=100,000 COLONIES/mL ESCHERICHIA COLI (A)  Final   Report Status 03/24/2024 FINAL  Final   Organism ID, Bacteria ESCHERICHIA COLI (A)  Final      Susceptibility   Escherichia coli - MIC*    AMPICILLIN >=  32 RESISTANT Resistant     CEFAZOLIN  RESISTANT Resistant     CEFEPIME  <=0.12 SENSITIVE Sensitive     CEFTRIAXONE  <=0.25 SENSITIVE Sensitive     CIPROFLOXACIN  >=4 RESISTANT Resistant     GENTAMICIN <=1 SENSITIVE Sensitive     IMIPENEM <=0.25 SENSITIVE Sensitive     NITROFURANTOIN  <=16 SENSITIVE Sensitive     TRIMETH /SULFA  >=320 RESISTANT Resistant     AMPICILLIN/SULBACTAM >=32 RESISTANT Resistant     PIP/TAZO 16 SENSITIVE Sensitive ug/mL    * >=100,000 COLONIES/mL ESCHERICHIA COLI  Blood culture (routine x 2)     Status: Abnormal   Collection Time: 03/22/24  9:38 AM   Specimen: BLOOD  Result Value Ref Range Status   Specimen Description   Final     BLOOD LEFT ANTECUBITAL Performed at [facility], [address] [place], [place] [address]    Special Requests   Final    BOTTLES DRAWN AEROBIC AND ANAEROBIC Blood Culture results may not be optimal due to an inadequate volume of blood received in culture bottles Performed at [facility], [address] [place], [place] [address]    Culture  Setup Time   Final    GRAM POSITIVE COCCI AEROBIC BOTTLE ONLY CRITICAL RESULT CALLED TO, READ BACK BY AND VERIFIED WITH: JASON ROBBINS 03/23/24 0445 MW    Culture (A)  Final    STAPHYLOCOCCUS WARNERI THE SIGNIFICANCE OF ISOLATING THIS ORGANISM FROM A SINGLE SET OF BLOOD CULTURES WHEN MULTIPLE SETS ARE DRAWN IS UNCERTAIN. PLEASE NOTIFY THE MICROBIOLOGY DEPARTMENT WITHIN ONE WEEK IF SPECIATION AND SENSITIVITIES ARE REQUIRED. Performed at [facility], [address], [place], [place] [address]    Report Status 03/25/2024 FINAL  Final  Blood culture (routine x 2)     Status: None   Collection Time: 03/22/24  9:38 AM   Specimen: BLOOD  Result Value Ref Range Status   Specimen Description BLOOD BLOOD LEFT ARM  Final   Special Requests   Final    BOTTLES DRAWN AEROBIC AND ANAEROBIC Blood Culture adequate volume   Culture   Final    NO GROWTH 5 DAYS Performed at [facility], [address] [place], [place] [address]    Report Status 03/27/2024 FINAL  Final  Blood Culture ID Panel (Reflexed)     Status: Abnormal   Collection Time: 03/22/24  9:38 AM  Result Value Ref Range Status   Enterococcus faecalis NOT DETECTED NOT DETECTED Final   Enterococcus Faecium NOT DETECTED NOT DETECTED Final   Listeria monocytogenes NOT DETECTED NOT DETECTED Final   Staphylococcus species DETECTED (A) NOT DETECTED Final    Comment: CRITICAL RESULT CALLED TO, READ BACK BY AND VERIFIED WITH: JASON ROBBINS 03/23/24 0445 MW    Staphylococcus aureus (BCID) NOT DETECTED NOT DETECTED Final   Staphylococcus epidermidis NOT DETECTED  NOT DETECTED Final   Staphylococcus lugdunensis NOT DETECTED NOT DETECTED Final   Streptococcus species NOT DETECTED NOT DETECTED Final   Streptococcus agalactiae NOT DETECTED NOT DETECTED Final   Streptococcus pneumoniae NOT DETECTED NOT DETECTED Final   Streptococcus pyogenes NOT DETECTED NOT DETECTED Final   A.calcoaceticus-baumannii NOT DETECTED NOT DETECTED Final   Bacteroides fragilis NOT DETECTED NOT DETECTED Final   Enterobacterales NOT DETECTED NOT DETECTED Final   Enterobacter cloacae complex NOT DETECTED NOT DETECTED Final   Escherichia coli NOT DETECTED NOT DETECTED Final   Klebsiella aerogenes NOT DETECTED NOT DETECTED Final   Klebsiella oxytoca NOT DETECTED NOT DETECTED Final   Klebsiella  pneumoniae NOT DETECTED NOT DETECTED Final   Proteus species NOT DETECTED NOT DETECTED Final   Salmonella species NOT DETECTED NOT DETECTED Final   Serratia marcescens NOT DETECTED NOT DETECTED Final   Haemophilus influenzae NOT DETECTED NOT DETECTED Final   Neisseria meningitidis NOT DETECTED NOT DETECTED Final   Pseudomonas aeruginosa NOT DETECTED NOT DETECTED Final   Stenotrophomonas maltophilia NOT DETECTED NOT DETECTED Final   Candida albicans NOT DETECTED NOT DETECTED Final   Candida auris NOT DETECTED NOT DETECTED Final   Candida glabrata NOT DETECTED NOT DETECTED Final   Candida krusei NOT DETECTED NOT DETECTED Final   Candida parapsilosis NOT DETECTED NOT DETECTED Final   Candida tropicalis NOT DETECTED NOT DETECTED Final   Cryptococcus neoformans/gattii NOT DETECTED NOT DETECTED Final    Comment: Performed at [facility], [address] [place], [place] [address]  MRSA Next Gen by PCR, Nasal     Status: None   Collection Time: 03/23/24  9:20 AM   Specimen: Nasal Mucosa; Nasal Swab  Result Value Ref Range Status   MRSA by PCR Next Gen NOT DETECTED NOT DETECTED Final    Comment: (NOTE) The GeneXpert MRSA Assay (FDA approved for NASAL specimens only), is one  component of a comprehensive MRSA colonization surveillance program. It is not intended to diagnose MRSA infection nor to guide or monitor treatment for MRSA infections. Test performance is not FDA approved in patients less than 52 years old. Performed at [facility], [address] [place], [place] [address]   Culture, blood (single) w Reflex to ID Panel     Status: None   Collection Time: 03/23/24  4:45 PM   Specimen: BLOOD  Result Value Ref Range Status   Specimen Description BLOOD BLOOD RIGHT HAND  Final   Special Requests   Final    BOTTLES DRAWN AEROBIC AND ANAEROBIC Blood Culture adequate volume   Culture   Final    NO GROWTH 5 DAYS Performed at [facility], [address], [place], [place] [address]    Report Status 03/28/2024 FINAL  Final  Culture, blood (Routine X 2) w Reflex to ID Panel     Status: None   Collection Time: 03/25/24 12:49 PM   Specimen: BLOOD  Result Value Ref Range Status   Specimen Description BLOOD BLOOD LEFT HAND  Final   Special Requests   Final    BOTTLES DRAWN AEROBIC AND ANAEROBIC Blood Culture adequate volume   Culture   Final    NO GROWTH 5 DAYS Performed at [facility], [address] [place], [place] [address]    Report Status 03/30/2024 FINAL  Final  Culture, blood (Routine X 2) w Reflex to ID Panel     Status: None   Collection Time: 03/25/24 12:50 PM   Specimen: BLOOD  Result Value Ref Range Status   Specimen Description BLOOD BLOOD RIGHT HAND  Final   Special Requests   Final    BOTTLES DRAWN AEROBIC AND ANAEROBIC Blood Culture results may not be optimal due to an inadequate volume of blood received in culture bottles   Culture   Final    NO GROWTH 5 DAYS Performed at [facility], [address] [place], [place] [address]    Report Status 03/30/2024 FINAL  Final    Coagulation Studies: No results for input(s): LABPROT, INR in the last 72 hours.  Urinalysis: No results for  input(s): COLORURINE, LABSPEC, PHURINE, GLUCOSEU, HGBUR, BILIRUBINUR, KETONESUR, PROTEINUR, UROBILINOGEN, NITRITE,  LEUKOCYTESUR in the last 72 hours.  Invalid input(s): APPERANCEUR    Imaging: PERIPHERAL VASCULAR CATHETERIZATION Result Date: 04/01/2024 See surgical note for result.  CT ABDOMEN PELVIS WO CONTRAST Result Date: 04/01/2024 CLINICAL DATA:  Concern for bowel obstruction. EXAM: CT ABDOMEN AND PELVIS WITHOUT CONTRAST TECHNIQUE: Multidetector CT imaging of the abdomen and pelvis was performed following the standard protocol without IV contrast. RADIATION DOSE REDUCTION: This exam was performed according to the departmental dose-optimization program which includes automated exposure control, adjustment of the mA and/or kV according to patient size and/or use of iterative reconstruction technique. COMPARISON:  CT abdomen pelvis dated 03/27/2024. FINDINGS: Evaluation of this exam is limited in the absence of intravenous contrast as well as due to respiratory motion and streak artifact caused by spinal hardware. Lower chest: Partially visualized moderate bilateral pleural effusions with partial compressive atelectasis of the lower lobes versus pneumonia. There is coronary vascular calcification. No intra-abdominal free air. Diffuse mesenteric edema and small ascites. Hepatobiliary: The liver is unremarkable. No biliary dilatation. The gallbladder is distended. Small amount of layering sludge or small stones in the gallbladder. Pancreas: The pancreas is poorly visualized but grossly unremarkable. Upper abdominal stranding, likely related to generalized edema. Correlation with pancreatic enzymes recommended to exclude acute pancreatitis. Spleen: Normal in size without focal abnormality. Adrenals/Urinary Tract: The adrenal glands unremarkable. There is a mild right hydronephrosis relatively similar to prior CT. Right ureteral stent with pigtail tip in the right renal pelvis and  distal end in the urinary bladder. The left kidney is unremarkable. The urinary bladder is mildly distended and grossly unremarkable. Air within the bladder likely introduced via recent a septation. Stomach/Bowel: There is no bowel obstruction. The appendix is not visualized with certainty. No inflammatory changes identified in the right lower quadrant. Vascular/Lymphatic: Advanced aortoiliac atherosclerotic disease. The IVC is unremarkable. No portal venous gas. There is no adenopathy. Reproductive: Hysterectomy.  No suspicious adnexal masses. Other: Diffuse subcutaneous edema and anasarca. Musculoskeletal: Osteopenia with degenerative changes of the spine. Extensive posterior fusion hardware. No acute osseous pathology. IMPRESSION: 1. No bowel obstruction. 2. Partially visualized moderate bilateral pleural effusions with partial compressive atelectasis of the lower lobes versus pneumonia. 3. Mild right hydronephrosis with a right ureteral stent in place. 4. Distended gallbladder with small amount of layering sludge or small stones. Correlation with pancreatic enzymes recommended to exclude acute pancreatitis. 5.  Aortic Atherosclerosis (ICD10-I70.0). Electronically Signed   By: Vanetta Chou M.D.   On: 04/01/2024 15:20   DG ABD ACUTE 2+V W 1V CHEST Result Date: 04/01/2024 CLINICAL DATA:  [id number] SBO (small bowel obstruction) (HCC) [id number] EXAM: DG ABDOMEN ACUTE WITH 1 VIEW CHEST COMPARISON:  March 31, 2024 FINDINGS: Similar gaseous distension of the small bowel and colon. No pneumoperitoneum. No organomegaly or radiopaque calculi. Right renal collecting system stent in unchanged position. No acute fracture or destructive lesion. Low lung volumes. Hazy attenuation of both lung bases, which may reflect small layering effusions and atelectasis.Thoracolumbar and sacroiliac fusion hardware again noted. IMPRESSION: 1. Similar diffuse gaseous distension throughout the small bowel and colon. 2. Low lung volumes. Hazy  attenuation of both lung bases, which may reflect small layering effusions and atelectasis. Electronically Signed   By: Rogelia Myers M.D.   On: 04/01/2024 08:24     Medications:    sodium chloride  Stopped (04/01/24 1541)    brimonidine   1 drop Both Eyes BID   Chlorhexidine  Gluconate Cloth  6 each Topical Daily   Chlorhexidine  Gluconate Cloth  6 each Topical  [id number]   diltiazem   300 mg Oral Daily   enoxaparin  (LOVENOX ) injection  30 mg Subcutaneous Q24H   feeding supplement  1 Container Oral TID BM   hydrALAZINE   50 mg Oral Q8H   insulin  aspart  0-9 Units Subcutaneous TID WC   metoCLOPramide   5 mg Oral BID   metoprolol  tartrate  50 mg Oral BID   multivitamin with minerals  1 tablet Oral Daily   pantoprazole   40 mg Oral Daily   polyethylene glycol  17 g Oral Daily   rosuvastatin   10 mg Oral Daily   sodium bicarbonate   1,300 mg Oral BID   sodium chloride  flush  3 mL Intravenous Q12H   sodium phosphate   1 enema Rectal Daily   thiamine   100 mg Oral Daily   acetaminophen  **OR** acetaminophen , albuterol , bisacodyl , docusate sodium , hydrALAZINE , ondansetron  **OR** ondansetron  (ZOFRAN ) IV, phenol  Assessment/ Plan:  83 y.o. female with past history of hypertension, diabetes mellitus type 2, rheumatoid arthritis, glaucoma, GERD, COPD, history of colon cancer who presented with ESBL UTI, generalized weakness.  1.  Acute kidney injury/chronic kidney disease stage IV/history of urinary retention.  Renal function remains low.  eGFR 12.  Right femoral dialysis catheter has been placed.  We are planning for hemodialysis treatment today.  Would recommend avoiding sodium phosphate  enemas given ongoing renal failure.  2.  Anemia of chronic kidney disease.  Hemoglobin currently 8.5.  Consider starting patient on Epogen  if she remains on permanent dialysis.  3.  Acute metabolic acidosis.  Serum bicarbonate currently 19.  This should correct with ongoing dialysis treatments.   LOS: 11 Cornelius Schuitema 7/3/202512:50 PM

## 2024-04-02 NOTE — Progress Notes (Addendum)
 [facility] SURGICAL PROGRESS NOTE (cpt [phone])  Hospital Day(s): 11.   Interval History: Patient seen and examined, no acute events or new complaints overnight. Patient remains obtunded this AM. Opens her eyes to verbal stimuli but does not respond or participate. She remains distended. Husband at bedside. sCr 3.66/BUN 61. She did have temporary dialysis catheter placed yesterday; plan for short run of HD today. WBC remains elevated 13.3K. No new imaging this morning. She does have BM recorded overnight.   Review of Systems:  Unable to reliably perform secondary to AMS  Vital signs in last 24 hours: [min-max] current  Temp:  [97.3 F (36.3 C)-97.4 F (36.3 C)] 97.3 F (36.3 C) (07/03 0831) Pulse Rate:  [51-59] 56 (07/03 0831) Resp:  [14-20] 16 (07/03 0831) BP: (134-167)/(59-65) 155/65 (07/03 0831) SpO2:  [93 %-96 %] 96 % (07/03 0831) Weight:  [77.6 kg] 77.6 kg (07/03 0500)     Height: 5' 6 (167.6 cm) Weight: 77.6 kg BMI (Calculated): 27.63   Intake/Output last 2 shifts:  07/02 0701 - 07/03 0700 In: 1748.5 [P.O.:380; I.V.:1300; IV Piggyback:68.5] Out: 825 [Urine:825]   Physical Exam:  Constitutional: Altered, opens eyes but does not participate  HENT: normocephalic without obvious abnormality Eyes: PERRL, EOM's grossly intact and symmetric  Respiratory: breathing non-labored at rest  Cardiovascular: regular rate and sinus rhythm  Gastrointestinal: soft, remains distended, does not appearing overtly tender, no rebound/guarding. She is certainly not peritonitic   Labs:     Latest Ref Rng & Units 04/02/2024    8:05 AM 04/01/2024    4:39 AM 03/31/2024    4:04 AM  CBC  WBC 4.0 - 10.5 K/uL 13.3  12.7  11.9   Hemoglobin 12.0 - 15.0 g/dL 8.5  8.6  8.5   Hematocrit 36.0 - 46.0 % 26.8  27.4  27.0   Platelets 150 - 400 K/uL 264  290  274       Latest Ref Rng & Units 04/02/2024    4:04 AM 04/01/2024    4:39 AM 03/31/2024    4:04 AM  CMP  Glucose 70 - 99 mg/dL 828  818   800   BUN 8 - 23 mg/dL 61  66  74   Creatinine 0.44 - 1.00 mg/dL 6.33  6.20  5.88   Sodium 135 - 145 mmol/L 133  134  136   Potassium 3.5 - 5.1 mmol/L 3.8  3.4  3.6   Chloride 98 - 111 mmol/L 107  106  107   CO2 22 - 32 mmol/L 19  18  20    Calcium  8.9 - 10.3 mg/dL 7.8  8.0  7.7      Imaging studies: No new pertinent imaging studies    Assessment/Plan: 83 y.o. female admitted with UTI, now with AKI and uremia/AMS now with likely ileus in setting of acute medical illness               - Certainly favor ileus in the setting of acute medical illness (AKI, Uremia) given lack of obstruction findings on imaging and continued bowel function. Unfortunately, there is not much from a surgical perspective to offer at this time and she is certainly a poor surgical candidate for laparotomy in this setting.   - Agree with involvement of palliative care regarding GOD and how aggressive to be. Husband at bedside this morning is very understanding.   - Appreciate vascular surgery assistance  - Monitor renal function; monitor UO - Monitor abdominal examination; on-going bowel  function - Pain control prn; avoid narcotics if feasible   - Antiemetics prn              - Mobilize as tolerated              - Further management per primary service; we will follow   All of the above findings and recommendations were discussed with the patient's family (husband at bedside), and the medical team, and all of their questions were answered to their expressed satisfaction.  -- Arthea Platt, PA-C [facility] 04/02/2024, 8:45 AM M-F: 7am - 4pm

## 2024-04-02 NOTE — Consult Note (Signed)
 Consultation Note Date: 04/02/2024   Patient Name: Stephanie [name]  DOB: October 29, 1940  MRN: [id number]  Age / Sex: 83 y.o., female  PCP: Cleotilde Oneil FALCON, MD Referring Physician: Lanetta Lingo, MD  Reason for Consultation: Establishing goals of care  HPI/Patient Profile: Per H&P Stephanie [name] is a 83 y.o. female with medical history significant of stage 4 CKD with anemia, afib not on AC, chronic urinary retention with foley, remote colon CA, DM, HLD, HTN, RA, and SBO who presented on 6/22 with generalized weakness. She reports that she came in because they made me.  She was so weak that she couldn't get from the bed to the couch.  She hardly ate at all yesterday.  She has been complaining that I feel terrible.  No obvious urinary symptoms.  No fever.  She has been tired all the time with poor appetite for several weeks but it was clearly worse today.   Clinical Assessment and Goals of Care: Notes, labs, diagnostics reviewed.  Spoke with staff on unit and in dialysis. Saw patient while in dialysis.  She is currently resting in bed at this time, eyes closed.  She is not responsive to voice or touch.  She is currently tolerating first dialysis session.  Up to bedside.  Patient's husband is not present at this time.  Will reattempt tomorrow.    SUMMARY OF RECOMMENDATIONS   PMT will follow.     Primary Diagnoses: Present on Admission:  Sepsis secondary to UTI [facility])  UPJ obstruction, acquired  Type II diabetes mellitus with renal manifestations (HCC)  Rheumatoid arthritis involving multiple sites with positive rheumatoid factor (HCC)  Pure hypercholesterolemia  CKD (chronic kidney disease) stage 5, GFR less than 15 ml/min (HCC)  Anemia due to stage 4 chronic kidney disease (HCC)  DM type 2 with diabetic mixed hyperlipidemia (HCC)   I have reviewed the medical record, interviewed the patient and family,  and examined the patient. The following aspects are pertinent.  Past Medical History:  Diagnosis Date   Anemia    Aortic atherosclerosis (HCC)    Arthritis    Asthma    CKD (chronic kidney disease), stage IV (HCC)    Colon cancer (HCC) 1996   Complication of anesthesia    a.) PONV   Diabetes mellitus without complication (HCC)    Dyspnea    Dysrhythmia    Foley catheter in place    GERD (gastroesophageal reflux disease)    Glaucoma    Hydronephrosis of right kidney    Hyperlipidemia    Hyperparathyroidism due to renal insufficiency (HCC)    Hypertension    Lumbar disc disease    Pneumonia    PONV (postoperative nausea and vomiting)    RBBB (right bundle branch block)    Rheumatoid arthritis involving multiple sites with positive rheumatoid factor (HCC)    Small bowel obstruction (HCC)    Urinary retention    Uses walker    Vitamin B 12 deficiency    Wears hearing aid in right  ear    has, does not wear   Social History   Socioeconomic History   Marital status: Married    Spouse name: [name]   Number of children: Not on file   Years of education: Not on file   Highest education level: Not on file  Occupational History   Not on file  Tobacco Use   Smoking status: Never   Smokeless tobacco: Never  Vaping Use   Vaping status: Never Used  Substance and Sexual Activity   Alcohol use: No   Drug use: No   Sexual activity: Yes  Other Topics Concern   Not on file  Social History Narrative   Not on file   Social Drivers of Health   Financial Resource Strain: Low Risk  (09/05/2023)   Received from [facility]   Overall Financial Resource Strain (CARDIA)    Difficulty of Paying Living Expenses: Not hard at all  Food Insecurity: No Food Insecurity (03/22/2024)   Hunger Vital Sign    Worried About Running Out of Food in the Last Year: Never true    Ran Out of Food in the Last Year: Never true  Transportation Needs: No Transportation Needs  (03/22/2024)   PRAPARE - Administrator, Civil Service (Medical): No    Lack of Transportation (Non-Medical): No  Physical Activity: Not on file  Stress: Not on file  Social Connections: Patient Declined (03/22/2024)   Social Connection and Isolation Panel    Frequency of Communication with Friends and Family: Patient declined    Frequency of Social Gatherings with Friends and Family: Patient declined    Attends Religious Services: Patient declined    Database administrator or Organizations: Patient declined    Attends Engineer, structural: Patient declined    Marital Status: Patient declined   Family History  Problem Relation Age of Onset   Breast cancer Neg Hx    Scheduled Meds:  brimonidine   1 drop Both Eyes BID   Chlorhexidine  Gluconate Cloth  6 each Topical Daily   Chlorhexidine  Gluconate Cloth  6 each Topical Q0600   diltiazem   300 mg Oral Daily   enoxaparin  (LOVENOX ) injection  30 mg Subcutaneous Q24H   feeding supplement  1 Container Oral TID BM   hydrALAZINE   50 mg Oral Q8H   insulin  aspart  0-9 Units Subcutaneous TID WC   metoCLOPramide   5 mg Oral BID   metoprolol  tartrate  50 mg Oral BID   multivitamin with minerals  1 tablet Oral Daily   pantoprazole   40 mg Oral Daily   polyethylene glycol  17 g Oral Daily   rosuvastatin   10 mg Oral Daily   sodium bicarbonate   1,300 mg Oral BID   sodium chloride  flush  3 mL Intravenous Q12H   sodium phosphate   1 enema Rectal Daily   thiamine   100 mg Oral Daily   Continuous Infusions:  sodium chloride  Stopped (04/01/24 1541)   PRN Meds:.acetaminophen  **OR** acetaminophen , albuterol , bisacodyl , docusate sodium , hydrALAZINE , ondansetron  **OR** ondansetron  (ZOFRAN ) IV, phenol Medications Prior to Admission:  Prior to Admission medications   Medication Sig Start Date End Date Taking? Authorizing Provider  acetaminophen  (TYLENOL ) 325 MG tablet Take 2 tablets (650 mg total) by mouth every 6 (six) hours as needed  for moderate pain, fever or headache. 07/10/20  Yes Fausto Sor A, DO  ascorbic acid  (VITAMIN C) 500 MG tablet Take 1 tablet (500 mg total) by mouth daily. 07/10/20  Yes Fausto,  Kelly A, DO  brimonidine  (ALPHAGAN ) 0.2 % ophthalmic solution Place 1 drop into both eyes 2 (two) times daily. 05/31/20  Yes [provider]  Cranberry 400 MG CAPS Take 400 mg by mouth daily.  03/24/20  Yes [provider]  diltiazem  (CARDIZEM  CD) 240 MG 24 hr capsule Take 240 mg by mouth daily. 11/06/23  Yes [provider]  glipiZIDE  (GLUCOTROL ) 10 MG tablet Take 10 mg by mouth daily before breakfast. 02/05/24  Yes [provider]  Iron -Vitamin C (VITRON-C) 65-125 MG TABS Take 1 tablet by mouth daily.   Yes [provider]  leflunomide  (ARAVA ) 10 MG tablet Take 10 mg by mouth daily.   Yes [provider]  metoCLOPramide  (REGLAN ) 5 MG tablet SMARTSIG:1 Tablet(s) By Mouth Morning-Night 01/31/24  Yes [provider]  metoprolol  tartrate (LOPRESSOR ) 50 MG tablet Take 50 mg by mouth 2 (two) times daily.   Yes [provider]  omeprazole (PRILOSEC) 40 MG capsule Take 40 mg by mouth daily. 09/11/20  Yes [provider]  Probiotic Product (PROBIOTIC-10 PO) Take 1 capsule by mouth daily.   Yes [provider]  rosuvastatin  (CRESTOR ) 10 MG tablet Take 10 mg by mouth daily.  03/24/20  Yes [provider]  sulfaSALAzine (AZULFIDINE) 500 MG EC tablet Take 500 mg by mouth daily. 02/26/24 05/26/24 Yes [provider]  torsemide  (DEMADEX ) 10 MG tablet Take 10 mg by mouth daily. One in morning and one in the evening 11/26/22  Yes [provider]  vitamin B-12 (CYANOCOBALAMIN ) 1000 MCG tablet Take 1,000 mcg by mouth daily.   Yes [provider]  VITAMIN D, CHOLECALCIFEROL, PO Take 1 capsule by mouth daily.   Yes [provider]  albuterol  (VENTOLIN  HFA) 108 (90 Base) MCG/ACT inhaler Inhale 1-2 puffs into the lungs  every 6 (six) hours as needed for wheezing or shortness of breath. Patient not taking: Reported on 03/22/2024    [provider]  CONTOUR TEST test strip 1 each by Other route daily.  03/09/18   [provider]  fluticasone -salmeterol (ADVAIR) 100-50 MCG/ACT AEPB Inhale 1 puff into the lungs 2 (two) times daily. Patient not taking: Reported on 03/22/2024    [provider]  Magnesium  Oxide 250 MG TABS Take 250 mg by mouth 2 (two) times daily. Patient not taking: Reported on 03/22/2024    [provider]  triamcinolone  (KENALOG ) 0.025 % ointment Apply 1 application. topically 2 (two) times daily. Patient not taking: Reported on 03/22/2024 12/22/21   Helon Kirsch A, PA-C   Allergies  Allergen Reactions   Remicade [Infliximab] Hives, Shortness Of Breath and Itching   Ibuprofen Itching   Indomethacin Other (See Comments)    headache   Methotrexate Derivatives Other (See Comments)    Headache   Moexipril Other (See Comments)    unknown   Percocet [Oxycodone -Acetaminophen ] Itching   Naprosyn [Naproxen] Rash   Review of Systems  Unable to perform ROS   Physical Exam Constitutional:      Comments: Eyes closed  Pulmonary:     Effort: Pulmonary effort is normal.  Skin:    General: Skin is warm and dry.     Vital Signs: BP (!) 178/74 (BP Location: Right Arm)   Pulse 67   Temp (!) 97.5 F (36.4 C) (Oral)   Resp 18   Ht 5' 6 (1.676 m)   Wt 75.1 kg   SpO2 97%   BMI 26.72 kg/m  Pain Scale: 0-10   Pain  Score: 0-No pain   SpO2: SpO2: 97 % O2 Device:SpO2: 97 % O2 Flow Rate: .   IO: Intake/output summary:  Intake/Output Summary (Last 24 hours) at 04/02/2024 1606 Last data filed at 04/02/2024 1526 Gross per 24 hour  Intake 1748.48 ml  Output 0 ml  Net 1748.48 ml    LBM: Last BM Date : 04/01/24 Baseline Weight: Weight: 69.4 kg Most recent weight: Weight: 75.1 kg      Signed by: Camelia Lewis, NP   Please contact Palliative Medicine  Team phone at [phone] for questions and concerns.  For individual provider: See Tracey

## 2024-04-02 NOTE — Progress Notes (Signed)
 PT Cancellation Note  Patient Details Name: Stephanie [name] MRN: [id number] DOB: [birth year]   Cancelled Treatment:     PT attempt. Pt remains off floor for HD and continues to be mostly unresponsive. Acute PT will continue to follow per current POC if plan remains to maximize independence. Palliative care consult pending.     [name] 04/02/2024, 3:26 PM

## 2024-04-02 NOTE — Progress Notes (Addendum)
 Progress Note   Patient: Stephanie [name] FMW:[id number] DOB: 1941/05/27 DOA: 03/22/2024     11 DOS: the patient was seen and examined on 04/02/2024   Brief hospital course:  Stephanie [name] is a 83 y.o. female with medical history significant of stage 4 CKD with anemia, afib not on AC, chronic urinary retention with foley, remote colon CA, DM, HLD, HTN, RA, and SBO who presented on 6/22 with generalized weakness. She reports that she came in because they made me.  She was so weak that she couldn't get from the bed to the couch.  She hardly ate at all yesterday.  She has been complaining that I feel terrible.  No obvious urinary symptoms.  No fever.  She has been tired all the time with poor appetite for several weeks but it was clearly worse today.     ER Course:  Sepsis from UTI.  h/o ESBL UTI, chronic retention with foley, UPJ stent.  Generalized weakness, vomiting.  WBC 18, lactate 2.  Urine looks purulent, foley exchanged.  DVT US  negative.  CT with stent in place, improving hydro, no obstructive uropathy.  Started on carbapenem due to AKI on CKD.      Assessment and Plan:  Sepsis due to UTI: E coli bacteremia Secondary to chronic indwelling Foley catheter Catheter was exchanged in the ER on admission 06/22 Patient has completed a 10-day course of IV Rocephin      Metabolic encephalopathy Most likely secondary to sepsis as well as worsening renal function Patient noted to be very lethargic, arouses easily but falls right back to sleep Expect improvement in mental status following resolution of acute illness     H/o urinary retention and hydronephrosis:  Foley catheter exchanged in the ER. S/p stent.  Mild right hydronephrosis as per renal US .        Acute kidney injury superimposed on stage V chronic kidney disease Appreciate nephrology input Renal replacement therapy recommended and initiated on 04/02/24 Temporary dialysis access placed 04/01/24 According to the patient and  husband at the bedside she had dialysis temporarily a few years ago but this was discontinued when her renal function improved      Partial SBO: Patient's abdomen remains distended Appreciate surgical input Diet has been downgraded to clear liquids which patient has been unable to take due to altered mental status Monitor with serial abdominal exams Patient had an NG tube which has been discontinued Patient will likely benefit from TPN       Transaminitis:  Resolved        Hypokalemia: Supplement potassium     ACD:  Secondary to chronic kidney disease. Monitor H&H closely     DM2 with complications of stage IV chronic kidney disease fair control, HbA1c 7.2.  Continue on SSI w/ accucheck        HTN:  continue on home dose of metoprolol , diltiazem , hydralazine  & clonidine  (started this admission).  Patient is bradycardic and will discontinue clonidine  Continue metoprolol  and diltiazem  with holding parameters Continue hydralazine      RA:  Leflunomide  is on hold due to active infection      COPD:  No acutely exacerbated.  Continue on bronchodilators      Glaucoma:  Continue on home dose of brimonidine             Subjective: Seen and examined at the bedside.  Remains lethargic  Physical Exam: Vitals:   04/02/24 1430 04/02/24 1500 04/02/24 1526 04/02/24 1537  BP: (!) 157/72 ROLLEN)  157/66 (!) 178/74   Pulse: 65 60 67   Resp: (!) 22 11 18    Temp:   (!) 97.5 F (36.4 C)   TempSrc:   Oral   SpO2: 97% 97% 97%   Weight:    75.1 kg  Height:       General exam: Lethargic but arouses to verbal stimuli. Hard of hearing  Respiratory system: clear breath sounds b/l  Cardiovascular system: S1/S2+. No rubs or clicks  Gastrointestinal system: abd is soft, NT, distended, hypoactive bowel sounds Central nervous system: Lethargic Psychiatry: Unable to assess   Data Reviewed: White count 13.3, hemoglobin 8.5 Labs reviewed  Family Communication: Plan of care  was discussed with patient's husband at the bedside.  He understands that her overall prognosis is poor and states that she has a living will and requests that she be placed on a DO NOT RESUSCITATE status  Disposition: Status is: Inpatient Remains inpatient appropriate because: Started renal replacement therapy  Planned Discharge Destination: TBD    Time spent: 45 minutes  Author: Aimee Somerset, MD 04/02/2024 3:51 PM  For on call review [email or web address].

## 2024-04-03 DIAGNOSIS — A419 Sepsis, unspecified organism: Secondary | ICD-10-CM | POA: Diagnosis not present

## 2024-04-03 DIAGNOSIS — Z789 Other specified health status: Secondary | ICD-10-CM | POA: Diagnosis not present

## 2024-04-03 DIAGNOSIS — Z7189 Other specified counseling: Secondary | ICD-10-CM | POA: Diagnosis not present

## 2024-04-03 DIAGNOSIS — N390 Urinary tract infection, site not specified: Secondary | ICD-10-CM | POA: Diagnosis not present

## 2024-04-03 LAB — RENAL FUNCTION PANEL
Albumin: 2.1 g/dL — ABNORMAL LOW (ref 3.5–5.0)
Anion gap: 14 (ref 5–15)
BUN: 42 mg/dL — ABNORMAL HIGH (ref 8–23)
CO2: 19 mmol/L — ABNORMAL LOW (ref 22–32)
Calcium: 8.3 mg/dL — ABNORMAL LOW (ref 8.9–10.3)
Chloride: 106 mmol/L (ref 98–111)
Creatinine, Ser: 2.79 mg/dL — ABNORMAL HIGH (ref 0.44–1.00)
GFR, Estimated: 17 mL/min — ABNORMAL LOW (ref >60)
Glucose, Bld: 154 mg/dL — ABNORMAL HIGH (ref 70–99)
Phosphorus: 4.9 mg/dL — ABNORMAL HIGH (ref 2.5–4.6)
Potassium: 3.8 mmol/L (ref 3.5–5.1)
Sodium: 139 mmol/L (ref 135–145)

## 2024-04-03 LAB — CBC
HCT: 27.8 % — ABNORMAL LOW (ref 36.0–46.0)
Hemoglobin: 8.7 g/dL — ABNORMAL LOW (ref 12.0–15.0)
MCH: 29.4 pg (ref 26.0–34.0)
MCHC: 31.3 g/dL (ref 30.0–36.0)
MCV: 93.9 fL (ref 80.0–100.0)
Platelets: 270 K/uL (ref 150–400)
RBC: 2.96 MIL/uL — ABNORMAL LOW (ref 3.87–5.11)
RDW: 15.3 % (ref 11.5–15.5)
WBC: 15.1 K/uL — ABNORMAL HIGH (ref 4.0–10.5)
nRBC: 0.1 % (ref 0.0–0.2)

## 2024-04-03 LAB — MAGNESIUM: Magnesium: 1.9 mg/dL (ref 1.7–2.4)

## 2024-04-03 LAB — GLUCOSE, CAPILLARY: Glucose-Capillary: 115 mg/dL — ABNORMAL HIGH (ref 70–99)

## 2024-04-03 LAB — HEPATITIS B SURFACE ANTIBODY, QUANTITATIVE: Hep B S AB Quant (Post): <3.5 m[IU]/mL — ABNORMAL LOW

## 2024-04-03 MED ORDER — HALOPERIDOL LACTATE 2 MG/ML PO CONC: 0.5000 mg | ORAL | Status: DC | PRN

## 2024-04-03 MED ORDER — POLYVINYL ALCOHOL 1.4 % OP SOLN
1.0000 [drp] | Freq: Four times a day (QID) | OPHTHALMIC | Status: DC | PRN
Start: 2024-04-03 — End: 2024-04-06

## 2024-04-03 MED ORDER — METOCLOPRAMIDE HCL 5 MG/ML IJ SOLN
5.0000 mg | Freq: Four times a day (QID) | INTRAMUSCULAR | Status: DC
  Administered 2024-04-03 – 2024-04-04 (×4): 5 mg via INTRAVENOUS
  Filled 2024-04-03 (×4): qty 2

## 2024-04-03 MED ORDER — GLYCOPYRROLATE 0.2 MG/ML IJ SOLN
0.2000 mg | INTRAMUSCULAR | Status: DC | PRN
  Administered 2024-04-04 – 2024-04-05 (×2): 0.2 mg via INTRAVENOUS
  Filled 2024-04-03 (×3): qty 1

## 2024-04-03 MED ORDER — LORAZEPAM 2 MG/ML IJ SOLN: 1.0000 mg | INTRAMUSCULAR | Status: DC | PRN

## 2024-04-03 MED ORDER — HEPARIN SODIUM (PORCINE) 1000 UNIT/ML IJ SOLN
INTRAMUSCULAR | Status: AC
  Filled 2024-04-03: qty 10

## 2024-04-03 MED ORDER — GLYCOPYRROLATE 0.2 MG/ML IJ SOLN: 0.2000 mg | INTRAMUSCULAR | Status: DC | PRN

## 2024-04-03 MED ORDER — HYDROMORPHONE HCL 1 MG/ML IJ SOLN
0.5000 mg | INTRAMUSCULAR | Status: DC | PRN
  Administered 2024-04-04 – 2024-04-05 (×4): 1 mg via INTRAVENOUS
  Filled 2024-04-03 (×4): qty 1

## 2024-04-03 MED ORDER — LORAZEPAM 2 MG/ML PO CONC: 1.0000 mg | ORAL | Status: DC | PRN

## 2024-04-03 MED ORDER — HALOPERIDOL LACTATE 5 MG/ML IJ SOLN: 0.5000 mg | INTRAMUSCULAR | Status: DC | PRN

## 2024-04-03 MED ORDER — BIOTENE DRY MOUTH MT LIQD: 15.0000 mL | OROMUCOSAL | Status: DC | PRN

## 2024-04-03 NOTE — Progress Notes (Signed)
 [facility]  ROUNDING NOTE   Subjective:   First hemodialysis treatment yesterday. Second hemodialysis treatment today.   Patient non responsive during examination.   Objective:  Vital signs in last 24 hours:  Temp:  [97.2 F (36.2 C)-98 F (36.7 C)] 97.2 F (36.2 C) (07/04 1056) Pulse Rate:  [57-86] 83 (07/04 1056) Resp:  [11-29] 23 (07/04 1056) BP: (107-188)/(62-78) 180/62 (07/04 1056) SpO2:  [93 %-98 %] 97 % (07/04 1056) Weight:  [74.8 kg-76 kg] 74.8 kg (07/04 1107)  Weight change: -2.5 kg Filed Weights   04/03/24 0417 04/03/24 0806 04/03/24 1107  Weight: 76 kg 74.8 kg 74.8 kg    Intake/Output: I/O last 3 completed shifts: In: 180 [P.O.:180] Out: 800 [Urine:800]   Intake/Output this shift:  No intake/output data recorded.  Physical Exam: General: ill  Head: Normocephalic, atraumatic. Moist oral mucosal membranes  Neck: Supple  Lungs:  Clear to auscultation, normal effort  Heart: bradycardia  Abdomen:  Soft, nontender, bowel sounds present  Extremities: 1+ peripheral edema.  Neurologic: Lethargic, difficult to arouse  Skin: No acute rash  Access: Right femoral temporary hemodialysis catheter     Basic Metabolic Panel: Recent Labs  Lab 03/28/24 0403 03/29/24 0853 03/30/24 0427 03/31/24 0404 04/01/24 0439 04/02/24 0404 04/03/24 0515 04/03/24 0840  NA 134*   < > 136 136 134* 133*  --  139  K 3.0*   < > 3.3* 3.6 3.4* 3.8  --  3.8  CL 105   < > 105 107 106 107  --  106  CO2 16*   < > 16* 20* 18* 19*  --  19*  GLUCOSE 114*   < > 213* 199* 181* 171*  --  154*  BUN 67*   < > 72* 74* 66* 61*  --  42*  CREATININE 4.00*   < > 4.32* 4.11* 3.79* 3.66*  --  2.79*  CALCIUM  7.7*   < > 8.0* 7.7* 8.0* 7.8*  --  8.3*  MG 2.7*  --   --   --  2.1 2.0 1.9  --   PHOS  --   --   --   --   --  5.9*  --  4.9*   < > = values in this interval not displayed.    Liver Function Tests: Recent Labs  Lab 04/03/24 0840  ALBUMIN  2.1*   No results for  input(s): LIPASE, AMYLASE in the last 168 hours. No results for input(s): AMMONIA in the last 168 hours.  CBC: Recent Labs  Lab 03/30/24 0427 03/31/24 0404 04/01/24 0439 04/02/24 0805 04/03/24 0840  WBC 12.2* 11.9* 12.7* 13.3* 15.1*  HGB 9.2* 8.5* 8.6* 8.5* 8.7*  HCT 30.9* 27.0* 27.4* 26.8* 27.8*  MCV 96.3 92.5 91.9 92.1 93.9  PLT 324 274 290 264 270    Cardiac Enzymes: No results for input(s): CKTOTAL, CKMB, CKMBINDEX, TROPONINI in the last 168 hours.  BNP: Invalid input(s): POCBNP  CBG: Recent Labs  Lab 04/01/24 2150 04/02/24 0829 04/02/24 1125 04/02/24 1633 04/03/24 1220  GLUCAP 158* 173* 183* 139* 115*    Microbiology: Results for orders placed or performed during the hospital encounter of 03/22/24  Resp panel by RT-PCR (RSV, Flu A&B, Covid) Anterior Nasal Swab     Status: None   Collection Time: 03/22/24  8:50 AM   Specimen: Anterior Nasal Swab  Result Value Ref Range Status   SARS Coronavirus 2 by RT PCR NEGATIVE NEGATIVE Final    Comment: (NOTE) SARS-CoV-2 target  nucleic acids are NOT DETECTED.  The SARS-CoV-2 RNA is generally detectable in upper respiratory specimens during the acute phase of infection. The lowest concentration of SARS-CoV-2 viral copies this assay can detect is 138 copies/mL. A negative result does not preclude SARS-Cov-2 infection and should not be used as the sole basis for treatment or other patient management decisions. A negative result may occur with  improper specimen collection/handling, submission of specimen other than nasopharyngeal swab, presence of viral mutation(s) within the areas targeted by this assay, and inadequate number of viral copies(<138 copies/mL). A negative result must be combined with clinical observations, patient history, and epidemiological information. The expected result is Negative.  Fact Sheet for Patients:  BloggerCourse.com  Fact Sheet for Healthcare  Providers:  SeriousBroker.it  This test is no t yet approved or cleared by the [place]  FDA and  has been authorized for detection and/or diagnosis of SARS-CoV-2 by FDA under an Emergency Use Authorization (EUA). This EUA will remain  in effect (meaning this test can be used) for the duration of the COVID-19 declaration under Section 564(b)(1) of the Act, 21 U.S.C.section 360bbb-3(b)(1), unless the authorization is terminated  or revoked sooner.       Influenza A by PCR NEGATIVE NEGATIVE Final   Influenza B by PCR NEGATIVE NEGATIVE Final    Comment: (NOTE) The Xpert Xpress SARS-CoV-2/FLU/RSV plus assay is intended as an aid in the diagnosis of influenza from Nasopharyngeal swab specimens and should not be used as a sole basis for treatment. Nasal washings and aspirates are unacceptable for Xpert Xpress SARS-CoV-2/FLU/RSV testing.  Fact Sheet for Patients: BloggerCourse.com  Fact Sheet for Healthcare Providers: SeriousBroker.it  This test is not yet approved or cleared by the [place]  FDA and has been authorized for detection and/or diagnosis of SARS-CoV-2 by FDA under an Emergency Use Authorization (EUA). This EUA will remain in effect (meaning this test can be used) for the duration of the COVID-19 declaration under Section 564(b)(1) of the Act, 21 U.S.C. section 360bbb-3(b)(1), unless the authorization is terminated or revoked.     Resp Syncytial Virus by PCR NEGATIVE NEGATIVE Final    Comment: (NOTE) Fact Sheet for Patients: BloggerCourse.com  Fact Sheet for Healthcare Providers: SeriousBroker.it  This test is not yet approved or cleared by the [place]  FDA and has been authorized for detection and/or diagnosis of SARS-CoV-2 by FDA under an Emergency Use Authorization (EUA). This EUA will remain in effect (meaning this test can be  used) for the duration of the COVID-19 declaration under Section 564(b)(1) of the Act, 21 U.S.C. section 360bbb-3(b)(1), unless the authorization is terminated or revoked.  Performed at [facility], [address] [place], [place] [address]   Urine Culture (for pregnant, neutropenic or urologic patients or patients with an indwelling urinary catheter)     Status: Abnormal   Collection Time: 03/22/24  8:50 AM   Specimen: Urine, Catheterized  Result Value Ref Range Status   Specimen Description   Final    URINE, CATHETERIZED Performed at [facility], [address] [place], [place] [address]    Special Requests   Final    NONE Performed at [facility], [address] [place], [place] [address]    Culture >=100,000 COLONIES/mL ESCHERICHIA COLI (A)  Final   Report Status 03/24/2024 FINAL  Final   Organism ID, Bacteria ESCHERICHIA COLI (A)  Final      Susceptibility   Escherichia coli - MIC*    AMPICILLIN >=32 RESISTANT  Resistant     CEFAZOLIN  RESISTANT Resistant     CEFEPIME  <=0.12 SENSITIVE Sensitive     CEFTRIAXONE  <=0.25 SENSITIVE Sensitive     CIPROFLOXACIN  >=4 RESISTANT Resistant     GENTAMICIN <=1 SENSITIVE Sensitive     IMIPENEM <=0.25 SENSITIVE Sensitive     NITROFURANTOIN  <=16 SENSITIVE Sensitive     TRIMETH /SULFA  >=320 RESISTANT Resistant     AMPICILLIN/SULBACTAM >=32 RESISTANT Resistant     PIP/TAZO 16 SENSITIVE Sensitive ug/mL    * >=100,000 COLONIES/mL ESCHERICHIA COLI  Blood culture (routine x 2)     Status: Abnormal   Collection Time: 03/22/24  9:38 AM   Specimen: BLOOD  Result Value Ref Range Status   Specimen Description   Final    BLOOD LEFT ANTECUBITAL Performed at [facility], [address] [place], [place] [address]    Special Requests   Final    BOTTLES DRAWN AEROBIC AND ANAEROBIC Blood Culture results may not be optimal due to an inadequate volume of blood received in culture bottles Performed at  [facility], [address] [place], [place] [address]    Culture  Setup Time   Final    GRAM POSITIVE COCCI AEROBIC BOTTLE ONLY CRITICAL RESULT CALLED TO, READ BACK BY AND VERIFIED WITH: JASON ROBBINS 03/23/24 0445 MW    Culture (A)  Final    STAPHYLOCOCCUS WARNERI THE SIGNIFICANCE OF ISOLATING THIS ORGANISM FROM A SINGLE SET OF BLOOD CULTURES WHEN MULTIPLE SETS ARE DRAWN IS UNCERTAIN. PLEASE NOTIFY THE MICROBIOLOGY DEPARTMENT WITHIN ONE WEEK IF SPECIATION AND SENSITIVITIES ARE REQUIRED. Performed at [facility], [address], [place], [place] [address]    Report Status 03/25/2024 FINAL  Final  Blood culture (routine x 2)     Status: None   Collection Time: 03/22/24  9:38 AM   Specimen: BLOOD  Result Value Ref Range Status   Specimen Description BLOOD BLOOD LEFT ARM  Final   Special Requests   Final    BOTTLES DRAWN AEROBIC AND ANAEROBIC Blood Culture adequate volume   Culture   Final    NO GROWTH 5 DAYS Performed at [facility], [address] [place], [place] [address]    Report Status 03/27/2024 FINAL  Final  Blood Culture ID Panel (Reflexed)     Status: Abnormal   Collection Time: 03/22/24  9:38 AM  Result Value Ref Range Status   Enterococcus faecalis NOT DETECTED NOT DETECTED Final   Enterococcus Faecium NOT DETECTED NOT DETECTED Final   Listeria monocytogenes NOT DETECTED NOT DETECTED Final   Staphylococcus species DETECTED (A) NOT DETECTED Final    Comment: CRITICAL RESULT CALLED TO, READ BACK BY AND VERIFIED WITH: JASON ROBBINS 03/23/24 0445 MW    Staphylococcus aureus (BCID) NOT DETECTED NOT DETECTED Final   Staphylococcus epidermidis NOT DETECTED NOT DETECTED Final   Staphylococcus lugdunensis NOT DETECTED NOT DETECTED Final   Streptococcus species NOT DETECTED NOT DETECTED Final   Streptococcus agalactiae NOT DETECTED NOT DETECTED Final   Streptococcus pneumoniae NOT DETECTED NOT DETECTED Final   Streptococcus pyogenes NOT DETECTED  NOT DETECTED Final   A.calcoaceticus-baumannii NOT DETECTED NOT DETECTED Final   Bacteroides fragilis NOT DETECTED NOT DETECTED Final   Enterobacterales NOT DETECTED NOT DETECTED Final   Enterobacter cloacae complex NOT DETECTED NOT DETECTED Final   Escherichia coli NOT DETECTED NOT DETECTED Final   Klebsiella aerogenes NOT DETECTED NOT DETECTED Final   Klebsiella oxytoca NOT DETECTED NOT DETECTED Final   Klebsiella pneumoniae NOT  DETECTED NOT DETECTED Final   Proteus species NOT DETECTED NOT DETECTED Final   Salmonella species NOT DETECTED NOT DETECTED Final   Serratia marcescens NOT DETECTED NOT DETECTED Final   Haemophilus influenzae NOT DETECTED NOT DETECTED Final   Neisseria meningitidis NOT DETECTED NOT DETECTED Final   Pseudomonas aeruginosa NOT DETECTED NOT DETECTED Final   Stenotrophomonas maltophilia NOT DETECTED NOT DETECTED Final   Candida albicans NOT DETECTED NOT DETECTED Final   Candida auris NOT DETECTED NOT DETECTED Final   Candida glabrata NOT DETECTED NOT DETECTED Final   Candida krusei NOT DETECTED NOT DETECTED Final   Candida parapsilosis NOT DETECTED NOT DETECTED Final   Candida tropicalis NOT DETECTED NOT DETECTED Final   Cryptococcus neoformans/gattii NOT DETECTED NOT DETECTED Final    Comment: Performed at [facility], [address] [place], [place] [address]  MRSA Next Gen by PCR, Nasal     Status: None   Collection Time: 03/23/24  9:20 AM   Specimen: Nasal Mucosa; Nasal Swab  Result Value Ref Range Status   MRSA by PCR Next Gen NOT DETECTED NOT DETECTED Final    Comment: (NOTE) The GeneXpert MRSA Assay (FDA approved for NASAL specimens only), is one component of a comprehensive MRSA colonization surveillance program. It is not intended to diagnose MRSA infection nor to guide or monitor treatment for MRSA infections. Test performance is not FDA approved in patients less than 82 years old. Performed at [facility], [address] [place], [place] [address]   Culture, blood (single) w Reflex to ID Panel     Status: None   Collection Time: 03/23/24  4:45 PM   Specimen: BLOOD  Result Value Ref Range Status   Specimen Description BLOOD BLOOD RIGHT HAND  Final   Special Requests   Final    BOTTLES DRAWN AEROBIC AND ANAEROBIC Blood Culture adequate volume   Culture   Final    NO GROWTH 5 DAYS Performed at [facility], [address] [place], [place] [address]    Report Status 03/28/2024 FINAL  Final  Culture, blood (Routine X 2) w Reflex to ID Panel     Status: None   Collection Time: 03/25/24 12:49 PM   Specimen: BLOOD  Result Value Ref Range Status   Specimen Description BLOOD BLOOD LEFT HAND  Final   Special Requests   Final    BOTTLES DRAWN AEROBIC AND ANAEROBIC Blood Culture adequate volume   Culture   Final    NO GROWTH 5 DAYS Performed at [facility], [address] [place], [place] [address]    Report Status 03/30/2024 FINAL  Final  Culture, blood (Routine X 2) w Reflex to ID Panel     Status: None   Collection Time: 03/25/24 12:50 PM   Specimen: BLOOD  Result Value Ref Range Status   Specimen Description BLOOD BLOOD RIGHT HAND  Final   Special Requests   Final    BOTTLES DRAWN AEROBIC AND ANAEROBIC Blood Culture results may not be optimal due to an inadequate volume of blood received in culture bottles   Culture   Final    NO GROWTH 5 DAYS Performed at [facility], [address] [place], [place] [address]    Report Status 03/30/2024 FINAL  Final    Coagulation Studies: No results for input(s): LABPROT, INR in the last 72 hours.  Urinalysis: No results for input(s): COLORURINE, LABSPEC, PHURINE, GLUCOSEU, HGBUR, BILIRUBINUR, KETONESUR, PROTEINUR, UROBILINOGEN, NITRITE, LEUKOCYTESUR in the  last 72 hours.  Invalid input(s): APPERANCEUR    Imaging: PERIPHERAL VASCULAR CATHETERIZATION Result Date: 04/01/2024 See surgical note for  result.    Medications:      brimonidine   1 drop Both Eyes BID   Chlorhexidine  Gluconate Cloth  6 each Topical Daily   Chlorhexidine  Gluconate Cloth  6 each Topical Q0600   diltiazem   300 mg Oral Daily   enoxaparin  (LOVENOX ) injection  30 mg Subcutaneous Q24H   feeding supplement  1 Container Oral TID BM   hydrALAZINE   50 mg Oral Q8H   insulin  aspart  0-9 Units Subcutaneous TID WC   metoCLOPramide   5 mg Oral BID   metoprolol  tartrate  50 mg Oral BID   multivitamin with minerals  1 tablet Oral Daily   pantoprazole   40 mg Oral Daily   polyethylene glycol  17 g Oral Daily   rosuvastatin   10 mg Oral Daily   sodium bicarbonate   1,300 mg Oral BID   sodium chloride  flush  3 mL Intravenous Q12H   sodium phosphate   1 enema Rectal Daily   thiamine   100 mg Oral Daily   acetaminophen  **OR** acetaminophen , albuterol , bisacodyl , docusate sodium , hydrALAZINE , ondansetron  **OR** ondansetron  (ZOFRAN ) IV, phenol  Assessment/ Plan:  83 y.o. female with past history of hypertension, diabetes mellitus type 2, rheumatoid arthritis, glaucoma, GERD, COPD, history of colon cancer who presented with ESBL UTI, generalized weakness.  1.  Acute kidney injury/chronic kidney disease stage IV/history of urinary retention.  Renal function remains low.  eGFR 12.  Right femoral dialysis catheter has been placed.  We are planning for hemodialysis treatment today.  Would recommend avoiding sodium phosphate  enemas given ongoing renal failure.  2.  Anemia of chronic kidney disease.  Hemoglobin currently 8.5.  Consider starting patient on Epogen  if she remains on permanent dialysis.  3.  Acute metabolic acidosis.  Serum bicarbonate currently 19.  This should correct with ongoing dialysis treatments.   LOS: 12 Vic Esco 7/4/202512:25 PM

## 2024-04-03 NOTE — Progress Notes (Signed)
 Nutrition Follow Up Note   DOCUMENTATION CODES:   Not applicable  INTERVENTION:   If tube feeds initiated:  Osmolite 1.2@60ml /hr- Initiate at 67ml/hr and increase by 10ml/hr q8 hours until goal rate is reached.   Free water  flushes 30ml q4 hours to maintain tube patency   Regimen provides 1728kcal/day, 80g/day protein and 1330ml/day of free water .   Rena-vit daily via tube  Thiamine  100mg  daily via tube x 7 days   Pt at high refeed risk; recommend monitor potassium, magnesium  and phosphorus labs daily until stable  Daily weights   NUTRITION DIAGNOSIS:   Inadequate oral intake related to altered GI function as evidenced by other (comment) (pt on NPO/clear liquid diet since admit). 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  Pt remains non-responsive. Pt has now been without adequate nutrition for > 7 days. Family having ongoing discussions with palliative care. If plan is for full aggressive care, plan is for NGT placement and trickle tube feeds. Pt remains at high refeed risk.  Pt is having some bowel function but remains distended. Per chart, pt is up ~11lbs since admission. Pt s/p HD initiation yesterday and is s/p HD today. RD will monitor for GOC.    Medications reviewed and include: lovenox , insulin , reglan , MVI, protonix , miralax , thiamine   Labs reviewed: K 3.8 wnl, BUN 42(H), creat 2.79(H), P 4.9(H), Mg 1.9 wnl Wbc- 15.1(H), Hgb 8.7(L), Hct 27.8(L) Cbgs- 115, 139, 183, 173 x 48 hrs  Diet Order:   Diet Order             Diet clear liquid Room service appropriate? Yes; Fluid consistency: Thin  Diet effective now                  EDUCATION NEEDS:   Not appropriate for education at this time  Skin:  Skin Assessment: Reviewed RN Assessment (ecchymosis)  Last BM:  7/2- type 7  Height:   Ht Readings from Last 1 Encounters:  03/22/24 5' 6 (1.676 m)    Weight:   Wt Readings from Last 1 Encounters:  04/03/24 74.8 kg    Ideal Body Weight:  59 kg  BMI:  Body mass index is 26.62 kg/m.  Estimated Nutritional Needs:   Kcal:  1600-1800kcal/day  Protein:  80-90g/day  Fluid:  1.6-1.8L/day  Augustin Shams MS, RD, LDN If unable to be reached, please send secure chat to RD inpatient available from 8:00a-4:00p daily

## 2024-04-03 NOTE — Progress Notes (Signed)
 OT Cancellation Note  Patient Details Name: Stephanie [name] MRN: [id number] DOB: 01/25/1941   Cancelled Treatment:    Reason Eval/Treat Not Completed: Patient at procedure or test/ unavailable. Pt at dialysis, will follow up as able.   Nilsa Macht R., MPH, MS, OTR/L ascom [phone] 04/03/24, 8:05 AM

## 2024-04-03 NOTE — Plan of Care (Signed)
   Problem: Coping: Goal: Level of anxiety will decrease Outcome: Progressing   Problem: Pain Managment: Goal: General experience of comfort will improve and/or be controlled Outcome: Progressing   Problem: Safety: Goal: Ability to remain free from injury will improve Outcome: Progressing

## 2024-04-03 NOTE — Progress Notes (Addendum)
 Daily Progress Note   Patient Name: Stephanie [name]       Date: 04/03/2024 DOB: 17-Oct-1940  Age: 83 y.o. MRN#: [id number] Attending Physician: Lanetta Lingo, MD Primary Care Physician: Cleotilde Oneil FALCON, MD Admit Date: 03/22/2024  Reason for Consultation/Follow-up: Establishing goals of care  Subjective: Notes and labs reviewed.  Patient is currently in HD.  In to see patient.  Spoke with nephrology.  Discussed previous conversations several months ago patient was clear that she would not want dialysis again.  Patient is resting in bed with eyes closed.  She does not arouse to voice or touch.  Went to room to talk to patient's husband.  Husband states they have been married for 62 years, they do not have children.  He states they have other family and friends which they are close to.  Husband discusses the past few months.  He states she will get in the car and go places with him or with her sister, but overall has been suffering and has had a poor quality of life.   We discussed her diagnoses, prognosis, GOC, EOL wishes disposition and options.    Created space and opportunity for patient  to explore thoughts and feelings regarding current medical information.   A detailed discussion was had today regarding advanced directives.  Concepts specific to code status, artifical feeding and hydration, IV antibiotics and rehospitalization were discussed.  The difference between an aggressive medical intervention path and a comfort care path was discussed.  Values and goals of care important to patient and family were attempted to be elicited.  Discussed limitations of medical interventions to prolong quality of life in some situations and discussed the concept of human mortality.  He states that he and  his wife are of strong faith, and advised that she has been ready to go.  He states she had been clear previously that she would not want dialysis again.  He states he felt that short-term dialysis may give her a chance of recovery.  We discussed recovery and quality of life.  We discussed the importance of patient autonomy, as well as patient advocacy.  We discussed the end-of-life process.  He states he would like to speak with patient's sister prior to formally shifting to comfort care.  Attending, Nephrology, RN made aware.   Returned  to speak with patient's husband and sister.  They state they have spoken with Dr. Douglas and are comfortable with the plan of transitioning to comfort care.  They do not want any further life-prolonging care.  Sister states patient has been clear that she is ready to die.  They are clear that if patient must leave the hospital, they would like the hospice facility for placement.  Husband and sister's questions were answered as able.  Discussed expectations for end-of-life.  Attending entered conversation and updates provided.  I completed a MOST form today with husband and patient's sister was present ,and the signed original was placed in the chart. Each section of options on the form were reviewed in full detail and any questions were answered as needed. The form was scanned and sent to medical records for it to be uploaded under ACP tab in Epic. A photocopy was also placed in the chart to be scanned into EMR. The patient outlined their wishes for the following treatment decisions:  Cardiopulmonary Resuscitation: Do Not Attempt Resuscitation (DNR/No CPR)  Medical Interventions: Comfort Measures: Keep clean, warm, and dry. Use medication by any route, positioning, wound care, and other measures to relieve pain and suffering. Use oxygen, suction and manual treatment of airway obstruction as needed for comfort. Do not transfer to the hospital unless comfort needs cannot  be met in current location.  Antibiotics: No antibiotics (use other measures to relieve symptoms)  IV Fluids: No IV fluids (provide other measures to ensure comfort)  Feeding Tube: No feeding tube     length of Stay: 12  Current Medications: Scheduled Meds:   brimonidine   1 drop Both Eyes BID   Chlorhexidine  Gluconate Cloth  6 each Topical Daily   Chlorhexidine  Gluconate Cloth  6 each Topical Q0600   diltiazem   300 mg Oral Daily   enoxaparin  (LOVENOX ) injection  30 mg Subcutaneous Q24H   feeding supplement  1 Container Oral TID BM   hydrALAZINE   50 mg Oral Q8H   insulin  aspart  0-9 Units Subcutaneous TID WC   metoCLOPramide   5 mg Oral BID   metoprolol  tartrate  50 mg Oral BID   multivitamin with minerals  1 tablet Oral Daily   pantoprazole   40 mg Oral Daily   polyethylene glycol  17 g Oral Daily   rosuvastatin   10 mg Oral Daily   sodium bicarbonate   1,300 mg Oral BID   sodium chloride  flush  3 mL Intravenous Q12H   sodium phosphate   1 enema Rectal Daily   thiamine   100 mg Oral Daily    Continuous Infusions:   PRN Meds: acetaminophen  **OR** acetaminophen , albuterol , bisacodyl , docusate sodium , hydrALAZINE , ondansetron  **OR** ondansetron  (ZOFRAN ) IV, phenol  Physical Exam Constitutional:      Comments: Eyes closed  Pulmonary:     Effort: Pulmonary effort is normal.  Musculoskeletal:     Comments: Edema all extremities  Skin:    General: Skin is warm and dry.             Vital Signs: BP (!) 180/62 (BP Location: Left Arm)   Pulse 83   Temp (!) 97.2 F (36.2 C) (Oral)   Resp (!) 23   Ht 5' 6 (1.676 m)   Wt 74.8 kg   SpO2 97%   BMI 26.62 kg/m  SpO2: SpO2: 97 % O2 Device: O2 Device: Room Air O2 Flow Rate:    Intake/output summary:  Intake/Output Summary (Last 24 hours) at 04/03/2024 1220 Last data filed at  04/03/2024 1056 Gross per 24 hour  Intake --  Output 800 ml  Net -800 ml   LBM: Last BM Date : 04/01/24 Baseline Weight: Weight: 69.4 kg Most recent  weight: Weight: 74.8 kg    Patient Active Problem List   Diagnosis Date Noted   Anemia due to stage 4 chronic kidney disease (HCC) 05/28/2023   Fever    Decubitus ulcer of coccyx, unspecified pressure ulcer stage 04/29/2022   Infected kidney cyst 04/28/2022   Acute unilateral obstructive uropathy 04/28/2022   Acute renal failure superimposed on stage 3b chronic kidney disease (HCC) 04/28/2022   Dyslipidemia 04/28/2022   GERD without esophagitis 04/28/2022   Hypertensive urgency 04/28/2022   Hypokalemia 03/12/2022   Sepsis secondary to UTI (HCC) 03/09/2022   Indwelling Foley catheter present 03/09/2022   Hyperkalemia 07/25/2021   Hydronephrosis of right kidney 07/25/2021   Type II diabetes mellitus with renal manifestations (HCC)    Symptomatic anemia    Asthma    Acute renal failure superimposed on stage 3a chronic kidney disease (HCC)    UTI (urinary tract infection)    Abnormal computed tomography of large intestine    Polyp of descending colon    S/p exchange of ureteral stent 02/06/22 03/22/2021   Aortic atherosclerosis (HCC) 02/23/2021   CKD (chronic kidney disease) stage 4, GFR 15-29 ml/min (HCC) 02/14/2021   Proteinuria 08/18/2020   UPJ obstruction, acquired 08/18/2020   Anemia secondary to renal failure 07/20/2020   CKD (chronic kidney disease) stage 5, GFR less than 15 ml/min (HCC)    Ileus (HCC)    Paroxysmal atrial fibrillation (HCC)    Encounter for dialysis and dialysis catheter care [facility])    Palliative care by specialist    SBO (small bowel obstruction) (HCC)    Pressure injury of skin 06/19/2020   Acquired thrombophilia (HCC) 06/19/2020   On continuous oral anticoagulation 06/19/2020   Acute pyelonephritis 06/18/2020   Hydronephrosis, right 06/18/2020   Lactic acid acidosis 06/18/2020   Hyperglycemia 06/18/2020   Hx of essential hypertension 06/18/2020   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   Atrial fibrillation with rapid ventricular  response (HCC)    Essential hypertension    Pure hypercholesterolemia    Diabetes mellitus (HCC) 06/17/2020   Rheumatoid arthritis (HCC) 06/17/2020   COVID-19 virus infection 06/17/2020   AKI (acute kidney injury) (HCC) 06/17/2020   Chronic left SI joint pain 04/19/2020   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T9-Sacrum) 04/19/2020   Chronic radicular lumbar pain 04/19/2020   Lumbar spondylosis 04/19/2020   Chronic pain syndrome 04/19/2020   Sepsis (HCC) 07/05/2019   CAP (community acquired pneumonia) 07/05/2019   Gastroenteritis 07/05/2019   DM type 2 with diabetic mixed hyperlipidemia (HCC) 04/30/2018   Elevated serum creatinine 10/16/2017   Rheumatoid arthritis involving multiple sites with positive rheumatoid factor (HCC) 08/13/2016   Hyperlipidemia, mixed 04/18/2016   Gross hematuria 11/01/2015   History of colon cancer 10/18/2014   Squamous cell metaplasia of urinary bladder 04/29/2014   Osteoarthritis of knee 04/01/2014   B-complex deficiency 03/03/2014   Benign essential hypertension 01/16/2014   Unspecified thoracic, thoracolumbar and lumbosacral intervertebral disc disorder 01/16/2014   Malignant neoplasm of colon (HCC) 01/16/2014   Bladder neoplasm of uncertain malignant potential 09/14/2013   Nocturia 08/05/2013   Idiopathic scoliosis and kyphoscoliosis 06/24/2013   Chronic cystitis 05/04/2013   Increased frequency of urination 05/04/2013   Incomplete emptying of bladder 05/04/2013    Palliative Care Assessment & Plan  Recommendations/Plan: Patient has been shifted to comfort care.  Orders have been placed.  Code Status:    Code Status Orders  (From admission, onward)           Start     Ordered   04/02/24 1105  Do not attempt resuscitation (DNR)- Limited -Do Not Intubate (DNI)  (Code Status)  Continuous       Question Answer Comment  If pulseless and not breathing No CPR or chest compressions.   In Pre-Arrest Conditions (Patient Is Breathing and Has A  Pulse) Do not intubate. Provide all appropriate non-invasive medical interventions. Avoid ICU transfer unless indicated or required.   Consent: Discussion documented in EHR or advanced directives reviewed      04/02/24 1104           Code Status History     Date Active Date Inactive Code Status Order ID Comments User Context   03/22/2024 1153 04/02/2024 1104 Full Code [id number]  Barbarann Nest, MD ED   04/29/2022 [phone] 05/02/2022 1854 Partial Code [id number]  Mansy, Madison LABOR, MD Inpatient   04/28/2022 2119 04/29/2022 0613 Full Code [id number]  Mansy, Madison LABOR, MD ED   03/09/2022 2319 03/13/2022 1640 Full Code [id number]  Cleatus Delayne GAILS, MD ED   07/25/2021 1133 07/26/2021 1955 Full Code [id number]  Hilma Rankins, MD Inpatient   02/14/2021 0223 02/16/2021 2209 Full Code [id number]  Cleatus Delayne GAILS, MD Inpatient   06/17/2020 0805 07/10/2020 1958 DNR [id number]  Lanetta Lingo, MD ED   07/05/2019 2255 07/09/2019 1904 Full Code [id number]  Jenel Lenis, MD Inpatient      Advance Directive Documentation    Flowsheet Row Most Recent Value  Type of Advance Directive Healthcare Power of Attorney, Living will  Pre-existing out of facility DNR order (yellow form or pink MOST form) --  MOST Form in Place? --    Prognosis:  < 2 weeks    Thank you for allowing the Palliative Medicine Team to assist in the care of this patient.     Camelia Lewis, NP  Please contact Palliative Medicine Team phone at [phone] for questions and concerns.

## 2024-04-03 NOTE — Progress Notes (Signed)
  Received patient in bed to unit.   Informed consent signed and in chart.    TX duration: 2.5hrs   Pt  not opening eyes and not responding to voice    Transported back to floor  Hand-off given to patient's nurse. No acute distress noted    Access used: R Femoral catheter Access issues: none   Total UF removed: 0L Medication(s) given: none Post HD VS: wnl      Olivia Hurst LPN [facility]

## 2024-04-03 NOTE — Progress Notes (Addendum)
 Progress Note   Patient: Stephanie [name] FMW:[id number] DOB: 10-05-1940 DOA: 03/22/2024     12 DOS: the patient was seen and examined on 04/03/2024   Brief hospital course: Stephanie [name] is a 83 y.o. female with medical history significant of stage 4 CKD with anemia, afib not on AC, chronic urinary retention with foley, remote colon CA, DM, HLD, HTN, RA, and SBO who presented on 6/22 with generalized weakness. She reports that she came in because they made me.  She was so weak that she couldn't get from the bed to the couch.  She hardly ate at all yesterday.  She has been complaining that I feel terrible.  No obvious urinary symptoms.  No fever.  She has been tired all the time with poor appetite for several weeks but it was clearly worse today.     ER Course:  Sepsis from UTI.  h/o ESBL UTI, chronic retention with foley, UPJ stent.  Generalized weakness, vomiting.  WBC 18, lactate 2.  Urine looks purulent, foley exchanged.  DVT US  negative.  CT with stent in place, improving hydro, no obstructive uropathy.  Started on carbapenem due to AKI on CKD.         Assessment and Plan:  Sepsis due to UTI: E coli bacteremia Secondary to chronic indwelling Foley catheter Catheter was exchanged in the ER on admission 06/22 Patient has completed a 10-day course of IV Rocephin      Metabolic encephalopathy Most likely secondary to sepsis as well as worsening renal function Patient noted to be very lethargic, arouses easily but falls right back to sleep There has been no improvement in patient's mental status despite initiation of renal replacement therapy Overall prognosis remains poor patient to be transition to comfort measures     H/o urinary retention and hydronephrosis:  Foley catheter exchanged in the ER. S/p stent.  Mild right hydronephrosis as per renal US .        Acute kidney injury superimposed on stage V chronic kidney disease Appreciate nephrology input Renal replacement therapy  recommended and initiated on 04/02/24 Temporary dialysis access placed 04/01/24 According to the patient and husband at the bedside she had dialysis temporarily a few years ago but this was discontinued when her renal function improved Patient to be transitioned to comfort measures and renal replacement therapy will be discontinued       Partial SBO: Patient's abdomen remains distended Appreciate surgical input Diet was downgraded to clear liquids which patient has been unable to take due to altered mental status We will keep n.p.o. for now May have comfort foods as tolerated       Transaminitis:  Resolved        Hypokalemia: Supplement potassium     ACD:  Secondary to chronic kidney disease. Monitor H&H closely     DM2 with complications of stage IV chronic kidney disease fair control, HbA1c 7.2.  Continue on SSI w/ accucheck        HTN:  Unable to receive any oral blood pressure meds Continue as needed IV hydralazine      RA:  Leflunomide  is on hold due to active infection      COPD:  No acutely exacerbated.  Continue on bronchodilators      Glaucoma:  Continue on home dose of brimonidine                  Subjective: Remains lethargic and arouses to painful stimuli  Physical Exam: Vitals:   04/03/24 1030 04/03/24  1056 04/03/24 1107 04/03/24 1225  BP: (!) 170/78 (!) 180/62  (!) 186/94  Pulse: 82 83  75  Resp: 18 (!) 23  18  Temp:  (!) 97.2 F (36.2 C)  (!) 97.5 F (36.4 C)  TempSrc:  Oral  Axillary  SpO2: 97% 97%  97%  Weight:   74.8 kg   Height:       General exam:  lethargic arouses to painful stimuli Respiratory system: clear breath sounds b/l  Cardiovascular system: S1/S2+. No rubs or clicks  Gastrointestinal system: abd is soft, NT, distended, hypoactive bowel sounds Central nervous system:   lethargic. Psychiatry:   unable to assess   Data Reviewed: Bicarb 19, BUN 42, creatinine 2.79, white count 15.1, hemoglobin 8.7. Labs  reviewed  Family Communication: Plan of care discussed with patient's husband and her sister.  Patient will be transition to comfort measures.  All questions and concerns have been addressed.  Disposition: Status is: Inpatient Remains inpatient appropriate because: Comfort measures  Planned Discharge Destination: Hospice    Time spent:  50 minutes  Author: Aimee Somerset, MD 04/03/2024 1:56 PM  For on call review [email or web address].

## 2024-04-04 DIAGNOSIS — A419 Sepsis, unspecified organism: Secondary | ICD-10-CM | POA: Diagnosis not present

## 2024-04-04 DIAGNOSIS — N390 Urinary tract infection, site not specified: Secondary | ICD-10-CM | POA: Diagnosis not present

## 2024-04-04 DIAGNOSIS — Z515 Encounter for palliative care: Secondary | ICD-10-CM

## 2024-04-04 DIAGNOSIS — Z789 Other specified health status: Secondary | ICD-10-CM | POA: Diagnosis not present

## 2024-04-04 DIAGNOSIS — N3001 Acute cystitis with hematuria: Secondary | ICD-10-CM

## 2024-04-04 DIAGNOSIS — N179 Acute kidney failure, unspecified: Secondary | ICD-10-CM | POA: Diagnosis not present

## 2024-04-04 MED ORDER — ORAL CARE MOUTH RINSE
15.0000 mL | OROMUCOSAL | Status: DC
  Administered 2024-04-04 – 2024-04-05 (×4): 15 mL via OROMUCOSAL

## 2024-04-04 MED ORDER — ORAL CARE MOUTH RINSE: 15.0000 mL | OROMUCOSAL | Status: DC | PRN

## 2024-04-04 NOTE — Plan of Care (Signed)
  Problem: Education: Goal: Ability to describe self-care measures that may prevent or decrease complications (Diabetes Survival Skills Education) will improve Outcome: Not Applicable Goal: Individualized Educational Video(s) Outcome: Not Applicable   Problem: Coping: Goal: Ability to adjust to condition or change in health will improve Outcome: Not Applicable   Problem: Skin Integrity: Goal: Risk for impaired skin integrity will decrease Outcome: Progressing   Problem: Safety: Goal: Ability to remain free from injury will improve Outcome: Progressing

## 2024-04-04 NOTE — Progress Notes (Signed)
  Progress Note   Patient: Stephanie [name] FMW:[id number] DOB: March 24, 1941 DOA: 03/22/2024     13 DOS: the patient was seen and examined on 04/04/2024   Brief hospital course:  KEIMORA SWARTOUT is a 83 y.o. female with medical history significant of stage 4 CKD with anemia, afib not on AC, chronic urinary retention with foley, remote colon CA, DM, HLD, HTN, RA, and SBO who presented on 6/22 with generalized weakness. She reports that she came in because they made me.  She was so weak that she couldn't get from the bed to the couch.  She hardly ate at all yesterday.  She has been complaining that I feel terrible.  No obvious urinary symptoms.  No fever.  She has been tired all the time with poor appetite for several weeks but it was clearly worse today.     ER Course:  Sepsis from UTI.  h/o ESBL UTI, chronic retention with foley, UPJ stent.  Generalized weakness, vomiting.  WBC 18, lactate 2.  Urine looks purulent, foley exchanged.  DVT US  negative.  CT with stent in place, improving hydro, no obstructive uropathy.  Started on carbapenem due to AKI on CKD.    Assessment and Plan: Patient admitted to the hospital for E. coli sepsis secondary to indwelling Foley catheter with complications of metabolic encephalopathy as well as acute kidney injury superimposed on stage V chronic kidney disease. Patient has continued to decline since this hospitalization and renal replacement therapy was initiated but subsequently discontinued since there has been no improvement in patient's overall condition.  She is currently on comfort measures. Continue comfort care       Subjective: Patient is seen and examined at the bedside. She is sleeping and does not arouse to verbal stimuli  Physical Exam: Vitals:   04/03/24 1225 04/03/24 2022 04/03/24 2100 04/04/24 0408  BP: (!) 186/94 (!) 201/80 (!) 188/90 (!) 193/86  Pulse: 75 (!) 102  (!) 108  Resp: 18 16  16   Temp: (!) 97.5 F (36.4 C) 97.7 F (36.5 C)  98.5 F  (36.9 C)  TempSrc: Axillary Axillary    SpO2: 97% 95%  93%  Weight:      Height:       General exam:  lethargic  Respiratory system: clear breath sounds b/l  Cardiovascular system: S1/S2+. No rubs or clicks  Gastrointestinal system: abd is soft, NT, distended, hypoactive bowel sounds Central nervous system:   lethargic. Psychiatry:   unable to assess  Data Reviewed:  There are no new results to review at this time.  Family Communication: Plan of care discussed with patient's husband and sister at the bedside.  They verbalized understanding and agree with the plan.  Disposition: Status is: Inpatient Remains inpatient appropriate because: On comfort measures  Planned Discharge Destination: Comfort measure    Time spent: 32 minutes  Author: Aimee Somerset, MD 04/04/2024 12:36 PM  For on call review [email or web address].

## 2024-04-04 NOTE — Progress Notes (Addendum)
 [facility] LIAISON NOTE  Received request from [place], Transitions of Care [facility]) for evaluation for IPU appropriateness or hospice services at home after discharge. Spoke with [name], spouse and patient's sister at bedside to initiate education related to hospice philosophy, services, and team approach to care. Patient/family verbalized understanding of information given. Per discussion, [name] states going home with hospice is not an option.  Referral sent to hospice MD for approval process. If patient is approved for Hospice Inpatient Unit [facility]): Please medicate the patient prior to EMS transport as needed for comfort during transport. Please send signed and completed DNR home with patient/family if applicable.   [facility] information and contact numbers given to Renelda Phenix, spouse.  Above information shared with [place], [facility] and hospital medical care team.  Please call with any hospice related questions or concerns.  Thank you for the opportunity to participate in this patient's care.  Saddie HILARIO Na, MA, BSN, RN, FNE Nurse Liaison [phone]

## 2024-04-04 NOTE — Progress Notes (Signed)
 Palliative Care Progress Note, Assessment & Plan   Patient Name: Stephanie [name]       Date: 04/04/2024 DOB: 05-13-1941  Age: 83 y.o. 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 Patient presented to ED 03/22/24 c/o N/V, generalized weakness with inability to walk and fall. She reports that she had been really tired with poor appetite over several weeks.  ED workup found NA+ 134, BUN 53, creatinine 3.32, GFR 13, albumin  3.1, AST 127, ALT 87, troponin 19, lactic 2, WBC 18.3, Hgb 9.3. UA moderate LE, >300 protein and many bacteria. BP 193/87, HR 69, RR 26, 97% RA.   Pt was admitted for management of sepsis secondary to UTI.  PMT was consulted for assistance with goals of care conversation.  After family meeting 7/4, patient was transitioned to comfort care by palliative care team.   Summary of counseling/coordination of care: Extensive chart review completed prior to meeting patient including labs, vital signs, imaging, progress notes, orders, and available advanced directive documents from current and previous encounters.   After reviewing the patient's chart and assessing the patient at bedside, I spoke with patient/family at the bedside in regards to symptom management and goals of care.   Elderly, ill-appearing female resting in bed. She does not respond to verbal or tactile stimuli. Even, unlabored respirations. She is in no distress.   Saddie, [facility] liaison at bedside talking with family during visit about options for  care. Husband expresses concern over moving patient to inpatient hospice unit stating he wants her moved a little as possible for comfort.   Mr. Balbi was advised that patient may be moved to downstairs palliative unit for end of life care but agreed that it would be uncomfortable for patient to be transferred to hospice IPU d/t excessive swelling of extremities. He agrees for transfer to palliative unit in hospital only.    Therapeutic silence and active listening provided for family to share their thoughts and emotions regarding current medical situation.  Emotional support provided.  Physical Exam Vitals reviewed.  Constitutional:      General: She is not in acute distress.    Appearance: She is ill-appearing.  HENT:     Head: Normocephalic and atraumatic.  Pulmonary:     Effort: Pulmonary effort is normal. No respiratory distress.  Musculoskeletal:     Right lower leg: Edema present.     Left lower leg: Edema present.     Comments: Edema to all extremities  Skin:    General: Skin is warm and dry.     Recommendations/Plan:   Comfort measures only  Focus of care is comfort and dignity allowing for natural death Utilize ordered medications to maintain comfort Anticipate hospital death  Total Time 50 minutes   Discussed plan of care with [facility] liaison and Dr. Lanetta.   Time spent includes: Detailed review of medical records (labs, imaging, vital signs), medically appropriate exam (mental status, respiratory,  cardiac, skin), discussed with treatment team, counseling and educating patient, family and staff, documenting clinical information, medication management and coordination of care.     Devere Sacks, ELNITA- [facility] Palliative Medicine Team  04/04/2024 9:06 AM  Office [phone]  Pager [phone]

## 2024-04-05 DIAGNOSIS — A419 Sepsis, unspecified organism: Secondary | ICD-10-CM | POA: Diagnosis not present

## 2024-04-05 DIAGNOSIS — Z515 Encounter for palliative care: Secondary | ICD-10-CM | POA: Diagnosis not present

## 2024-04-05 DIAGNOSIS — N179 Acute kidney failure, unspecified: Secondary | ICD-10-CM | POA: Diagnosis not present

## 2024-04-05 DIAGNOSIS — Z66 Do not resuscitate: Secondary | ICD-10-CM | POA: Diagnosis not present

## 2024-04-05 DIAGNOSIS — N390 Urinary tract infection, site not specified: Secondary | ICD-10-CM | POA: Diagnosis not present

## 2024-04-06 LAB — GLUCOSE, CAPILLARY
Glucose-Capillary: 151 mg/dL — ABNORMAL HIGH (ref 70–99)
Glucose-Capillary: 147 mg/dL — ABNORMAL HIGH (ref 70–99)

## 2024-04-23 ENCOUNTER — Ambulatory Visit: Admitting: Physician Assistant

## 2024-04-28 ENCOUNTER — Ambulatory Visit: Admitting: Internal Medicine

## 2024-04-28 ENCOUNTER — Ambulatory Visit

## 2024-04-28 ENCOUNTER — Other Ambulatory Visit

## 2024-05-01 NOTE — Progress Notes (Signed)
  Progress Note   Patient: Stephanie [name] FMW:[id number] DOB: 03-18-1941 DOA: 03/22/2024     14 DOS: the patient was seen and examined on 04/29/2024   Brief hospital course: Stephanie [name] is a 83 y.o. female with medical history significant of stage 4 CKD with anemia, afib not on AC, chronic urinary retention with foley, remote colon CA, DM, HLD, HTN, RA, and SBO who presented on 6/22 with generalized weakness. She reports that she came in because they made me.  She was so weak that she couldn't get from the bed to the couch.  She hardly ate at all yesterday.  She has been complaining that I feel terrible.  No obvious urinary symptoms.  No fever.  She has been tired all the time with poor appetite for several weeks but it was clearly worse today.     ER Course:  Sepsis from UTI.  h/o ESBL UTI, chronic retention with foley, UPJ stent.  Generalized weakness, vomiting.  WBC 18, lactate 2.  Urine looks purulent, foley exchanged.  DVT US  negative.  CT with stent in place, improving hydro, no obstructive uropathy.  Started on carbapenem due to AKI on CKD.        Assessment and Plan:  Patient admitted to the hospital for E. coli sepsis secondary to indwelling Foley catheter with complications of metabolic encephalopathy as well as acute kidney injury superimposed on stage V chronic kidney disease. Patient has continued to decline since this hospitalization and renal replacement therapy was initiated but subsequently discontinued since there has been no improvement in patient's overall condition.  She is currently on comfort measures. Continue comfort care          Subjective: No events overnight.  Resting comfortably.  Sister at the bedside.  Physical Exam: Vitals:   04/03/24 2100 04/04/24 0408 04/04/24 2024 2024-04-29 0120  BP: (!) 188/90 (!) 193/86 107/61   Pulse:  (!) 108 (!) 138   Resp:  16 18   Temp:  98.5 F (36.9 C) 99.9 F (37.7 C)   TempSrc:   Axillary   SpO2:  93% (!) 82% 92%   Weight:      Height:       General exam:  lethargic  Respiratory system: clear breath sounds b/l  Cardiovascular system: S1/S2+. No rubs or clicks  Gastrointestinal system: abd is soft, NT, distended, hypoactive bowel sounds Central nervous system:   lethargic. Psychiatry:   unable to assess     Data Reviewed:  There are no new results to review at this time.  Family Communication: Plan of care discussed with patient's sister at the bedside.  She verbalizes understanding and agrees with the plan.  Disposition: Status is: Inpatient Remains inpatient appropriate because: Patient on comfort measures  Planned Discharge Destination: Hospice    Time spent: 35 minutes  Author: Aimee Somerset, MD 04/29/2024 12:26 PM  For on call review [email or web address].

## 2024-05-01 NOTE — Progress Notes (Signed)
                                                     Palliative Care Progress Note, Assessment & Plan   Patient Name: Stephanie [name]       Date: 05/01/2024 DOB: March 02, 1941  Age: 83 y.o. 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 Patient presented to ED 03/22/24 c/o N/V, generalized weakness with inability to walk and fall. She reports that she had been really tired with poor appetite over several weeks.   ED workup found NA+ 134, BUN 53, creatinine 3.32, GFR 13, albumin  3.1, AST 127, ALT 87, troponin 19, lactic 2, WBC 18.3, Hgb 9.3. UA moderate LE, >300 protein and many bacteria. BP 193/87, HR 69, RR 26, 97% RA.    Pt was admitted for management of sepsis secondary to UTI.  PMT was consulted for assistance with goals of care conversation.   After family meeting 7/4, patient was transitioned to comfort care by palliative care team.   Summary of counseling/coordination of care: Extensive chart review completed prior to meeting patient including labs, vital signs, imaging, progress notes, orders, and available advanced directive documents from current and previous encounters.   After reviewing the patient's chart and assessing the patient at bedside, I spoke with patient in regards to symptom management and goals of care.   Elderly, ill-appearing female lying in bed. She does not awaken to verbal/tactile stimuli. She is noted to have periods of tachypnea 30-40 RR) and apnea. Patient noted to have intermittent myoclonus during visit. Requested RN to medicate for symptoms.  Sister at bedside.   Therapeutic silence and active listening provided for sister to share her thoughts and emotions regarding current medical situation.  Emotional support provided.  Physical Exam Vitals reviewed.  Constitutional:      General: She is not in acute distress.    Appearance: She is ill-appearing.  HENT:     Head: Normocephalic and atraumatic.  Pulmonary:     Effort: No respiratory distress.     Comments: Periods of tachypnea and apnea Musculoskeletal:     Right lower leg: Edema present.     Left lower leg: Edema present.     Comments: Edema to all extremities  Skin:    General: Skin is warm and dry.     Coloration: Skin is pale.       Recommendations/Plan:    Comfort measures only  Focus of care is comfort and dignity allowing for natural death Utilize ordered medications to maintain comfort Anticipate hospital death        Total Time 50 minutes   Discussed plan of care with primary RN.   Time spent includes: Detailed review of medical records (labs, imaging, vital signs), medically appropriate exam (mental status, respiratory, cardiac, skin), discussed with treatment team, counseling and educating patient, family and staff, documenting clinical information, medication management and coordination of care.     Devere Sacks, ELNITA- [facility] Palliative Medicine Team  05/01/2024 9:35 AM  Office [phone]  Pager [phone]

## 2024-05-01 NOTE — Progress Notes (Signed)
 Family called out stating, pt was not breathing anymore. This care RN and Osa RN pronounce time of death at 04-20-47 per order. On call provider Dr. Lawence notified of patients passing.

## 2024-05-01 NOTE — Plan of Care (Addendum)
 Pt turn q2H; husband at bedside.  Problem: Safety: Goal: Ability to remain free from injury will improve Outcome: Progressing   Problem: Skin Integrity: Goal: Risk for impaired skin integrity will decrease Outcome: Progressing

## 2024-05-01 NOTE — Progress Notes (Signed)
 Got report from Bryant, Charity fundraiser.Pt sleeping, family at bedside. No significant changes at this tie, will  continue to monitor.

## 2024-05-01 NOTE — Death Summary Note (Signed)
 DEATH SUMMARY   Patient Details  Name: Stephanie [name] MRN: [id number] DOB: 28-May-1941 ERE:Fpoozm, Oneil FALCON, MD Admission/Discharge Information   Admit Date:  04-12-2024  Date of Death: Date of Death: 04/26/24  Time of Death: Time of Death: May 11, 2047  Length of Stay: 05/04/2024   Principle Cause of death: Sepsis secondary to UTI                                             Acute kidney injury superimposed on stage 4 CKD  Hospital Diagnoses: Principal Problem:   Sepsis secondary to UTI [facility]) Active Problems:   DM type 2 with diabetic mixed hyperlipidemia (HCC)   Rheumatoid arthritis involving multiple sites with positive rheumatoid factor (HCC)   Pure hypercholesterolemia   Encounter for dialysis and dialysis catheter care (HCC)   CKD (chronic kidney disease) stage 5, GFR less than 15 ml/min (HCC)   UPJ obstruction, acquired   Type II diabetes mellitus with renal manifestations (HCC)   Anemia due to stage 4 chronic kidney disease (HCC)   Medical orders for scope of treatment form in chart   Hospital Course: Stephanie [name] is a 83 y.o. female with medical history significant of stage 4 CKD with anemia, afib not on AC, chronic urinary retention with foley, remote colon CA, DM, HLD, HTN, RA, and SBO who presented on April 12, 2024 with generalized weakness. She reports that she came in because they made me.  She was so weak that she couldn't get from the bed to the couch.  She hardly ate at all yesterday.  She has been complaining that I feel terrible.  No obvious urinary symptoms.  No fever.  She has been tired all the time with poor appetite for several weeks but it was clearly worse today.     ER Course:  Sepsis from UTI.  h/o ESBL UTI, chronic retention with foley, UPJ stent.  Generalized weakness, vomiting.  WBC 18, lactate 2.  Urine looks purulent, foley exchanged.  DVT US  negative.  CT with stent in place, improving hydro, no obstructive uropathy.  Started on carbapenem due to AKI on  CKD.     Assessment and Plan:  Patient admitted to the hospital for E. coli sepsis secondary to indwelling Foley catheter with complications of metabolic encephalopathy as well as acute kidney injury superimposed on stage V chronic kidney disease. Patient  continued to decline since this hospitalization and renal replacement therapy was initiated but subsequently discontinued since there was no improvement in patient's overall condition.   Patient's husband and sister elected to place her on comfort measures and patient expired on 04/26/24 at 8.48 pm        Procedures: NGT, Temporary dialysis catheter placement  Consultations: Vascular surgery, Infectious disease, Nephrology  The results of significant diagnostics from this hospitalization (including imaging, microbiology, ancillary and laboratory) are listed below for reference.   Significant Diagnostic Studies: PERIPHERAL VASCULAR CATHETERIZATION Result Date: 04/01/2024 See surgical note for result.  CT ABDOMEN PELVIS WO CONTRAST Result Date: 04/01/2024 CLINICAL DATA:  Concern for bowel obstruction. EXAM: CT ABDOMEN AND PELVIS WITHOUT CONTRAST TECHNIQUE: Multidetector CT imaging of the abdomen and pelvis was performed following the standard protocol without IV contrast. RADIATION DOSE REDUCTION: This exam was performed according to the departmental dose-optimization program which includes automated exposure control, adjustment of the mA and/or kV according to patient size  and/or use of iterative reconstruction technique. COMPARISON:  CT abdomen pelvis dated 03/27/2024. FINDINGS: Evaluation of this exam is limited in the absence of intravenous contrast as well as due to respiratory motion and streak artifact caused by spinal hardware. Lower chest: Partially visualized moderate bilateral pleural effusions with partial compressive atelectasis of the lower lobes versus pneumonia. There is coronary vascular calcification. No intra-abdominal free  air. Diffuse mesenteric edema and small ascites. Hepatobiliary: The liver is unremarkable. No biliary dilatation. The gallbladder is distended. Small amount of layering sludge or small stones in the gallbladder. Pancreas: The pancreas is poorly visualized but grossly unremarkable. Upper abdominal stranding, likely related to generalized edema. Correlation with pancreatic enzymes recommended to exclude acute pancreatitis. Spleen: Normal in size without focal abnormality. Adrenals/Urinary Tract: The adrenal glands unremarkable. There is a mild right hydronephrosis relatively similar to prior CT. Right ureteral stent with pigtail tip in the right renal pelvis and distal end in the urinary bladder. The left kidney is unremarkable. The urinary bladder is mildly distended and grossly unremarkable. Air within the bladder likely introduced via recent a septation. Stomach/Bowel: There is no bowel obstruction. The appendix is not visualized with certainty. No inflammatory changes identified in the right lower quadrant. Vascular/Lymphatic: Advanced aortoiliac atherosclerotic disease. The IVC is unremarkable. No portal venous gas. There is no adenopathy. Reproductive: Hysterectomy.  No suspicious adnexal masses. Other: Diffuse subcutaneous edema and anasarca. Musculoskeletal: Osteopenia with degenerative changes of the spine. Extensive posterior fusion hardware. No acute osseous pathology. IMPRESSION: 1. No bowel obstruction. 2. Partially visualized moderate bilateral pleural effusions with partial compressive atelectasis of the lower lobes versus pneumonia. 3. Mild right hydronephrosis with a right ureteral stent in place. 4. Distended gallbladder with small amount of layering sludge or small stones. Correlation with pancreatic enzymes recommended to exclude acute pancreatitis. 5.  Aortic Atherosclerosis (ICD10-I70.0). Electronically Signed   By: Vanetta Chou M.D.   On: 04/01/2024 15:20   DG ABD ACUTE 2+V W 1V  CHEST Result Date: 04/01/2024 CLINICAL DATA:  [id number] SBO (small bowel obstruction) (HCC) [id number] EXAM: DG ABDOMEN ACUTE WITH 1 VIEW CHEST COMPARISON:  March 31, 2024 FINDINGS: Similar gaseous distension of the small bowel and colon. No pneumoperitoneum. No organomegaly or radiopaque calculi. Right renal collecting system stent in unchanged position. No acute fracture or destructive lesion. Low lung volumes. Hazy attenuation of both lung bases, which may reflect small layering effusions and atelectasis.Thoracolumbar and sacroiliac fusion hardware again noted. IMPRESSION: 1. Similar diffuse gaseous distension throughout the small bowel and colon. 2. Low lung volumes. Hazy attenuation of both lung bases, which may reflect small layering effusions and atelectasis. Electronically Signed   By: Rogelia Myers M.D.   On: 04/01/2024 08:24   DG Abd 2 Views Result Date: 03/31/2024 CLINICAL DATA:  [id number] Ileus [facility]) [id number] EXAM: ABDOMEN - 2 VIEW COMPARISON:  March 29, 2024 FINDINGS: Similar gaseous distention of the small bowel and colon. No pneumoperitoneum. No organomegaly or radiopaque calculi. Right ureteral stent in unchanged position. No acute fracture or destructive lesion. Unchanged lumbosacral fusion hardware. The lung bases are clear. IMPRESSION: Similar gaseous distension throughout the small bowel and colon. The enteric contrast has now reached the rectum. Electronically Signed   By: Rogelia Myers M.D.   On: 03/31/2024 09:15   CT HEAD WO CONTRAST ( ) Result Date: 03/30/2024 CLINICAL DATA:  Mental status change, unknown cause EXAM: CT HEAD WITHOUT CONTRAST TECHNIQUE: Contiguous axial images were obtained from the base of the skull through the vertex without intravenous  contrast. RADIATION DOSE REDUCTION: This exam was performed according to the departmental dose-optimization program which includes automated exposure control, adjustment of the mA and/or kV according to patient size and/or use of iterative  reconstruction technique. COMPARISON:  CT head March 28, 2018. FINDINGS: Brain: Remote bilateral cerebellar infarcts. Remote appearing left frontal and left parieto-occipital infarcts. No evidence of acute large vascular territory infarct, acute hemorrhage, mass lesion, midline shift or hydrocephalus. Cerebral atrophy. Vascular: Calcific atherosclerosis. No hyperdense vessel identified. Skull: No acute fracture. Sinuses/Orbits: Clear sinuses.  No acute orbital findings. Other: Small left mastoid effusion. IMPRESSION: 1. No evidence of acute intracranial abnormality. 2. Remote appearing left frontal, left parieto-occipital, and bilateral cerebellar infarcts. MRI could provide more sensitive evaluation for acute infarct if clinically warranted. Electronically Signed   By: Gilmore GORMAN Molt M.D.   On: 03/30/2024 23:00   US  RENAL Result Date: 03/29/2024 CLINICAL DATA:  Hydronephrosis. EXAM: RENAL / URINARY TRACT ULTRASOUND COMPLETE COMPARISON:  03/27/2024, 01/09/2024. FINDINGS: Right Kidney: Renal measurements: 8.8 x 3.9 x 4.7 cm = volume: 85.5 mL. Increased parenchymal echogenicity. Multiple cysts are identified, the largest measuring 1.7 x 1.1 x 1.3 cm. There is mild hydronephrosis with ureteral stent in place. Left Kidney: Renal measurements: 9.7 x 4.9 x 5.0 cm = volume: 123.5 mL. Increased parenchymal echogenicity. Multiple cysts are identified, largest measuring 1.5 x 0.9 x 1.1 cm. No mass or hydronephrosis visualized. Bladder: The bladder is minimally distended in a Foley catheter is noted. Other: None. IMPRESSION: 1. Mild hydronephrosis on the right with ureteral stent in place. 2. Increased renal echogenicity and cysts bilaterally, compatible with medical renal disease. 3. Foley catheter in place. Electronically Signed   By: Leita Birmingham M.D.   On: 03/29/2024 14:12   DG Abd Portable 1V-Small Bowel Obstruction Protocol-initial, 8 hr delay Result Date: 03/29/2024 CLINICAL DATA:  Small-bowel obstruction  protocol, 8 hour delay. EXAM: PORTABLE ABDOMEN - 1 VIEW COMPARISON:  Radiographs 03/28/2024 at 7:53 a.m. FINDINGS: Enteric tube tip and side-port in the stomach. Right ureteral stent. Extensive thoracolumbar fusion hardware. Enteric contrast is seen within the ascending and descending colon. Continued mild improvement of the dilated bowel loops throughout the abdomen. IMPRESSION: Continued mild improvement of the dilated bowel loops throughout the abdomen. Enteric contrast is seen within the ascending and descending colon. Electronically Signed   By: Norman Gatlin M.D.   On: 03/29/2024 01:58   DG ABD ACUTE 2+V W 1V CHEST Result Date: 03/28/2024 CLINICAL DATA:  Follow-up dilated bowel loops. Clinical diagnosis of small-bowel obstruction. EXAM: DG ABDOMEN ACUTE WITH 1 VIEW CHEST COMPARISON:  03/27/2024 and abdomen CT dated 03/27/2024 FINDINGS: The nasogastric tube has been retracted with its tip in the lateral right mid abdomen in a more medial location than previously seen. The side hole is in the medial right upper abdomen. Mildly improved dilated bowel loops. Stable spine and sacral fixation hardware, pelvic surgical clips and right ureteral stent. IMPRESSION: 1. Mildly improved dilated bowel loops. 2. The nasogastric tube has been retracted with its tip in the lateral right mid abdomen in a more medial location than previously seen. Electronically Signed   By: Elspeth Bathe M.D.   On: 03/28/2024 11:43   CT ABDOMEN WO CONTRAST Result Date: 03/27/2024 CLINICAL DATA:  NG tube placement with question of extraluminal position. EXAM: CT ABDOMEN WITHOUT CONTRAST TECHNIQUE: Multidetector CT imaging of the abdomen was performed following the standard protocol without IV contrast. RADIATION DOSE REDUCTION: This exam was performed according to the departmental dose-optimization program which  includes automated exposure control, adjustment of the mA and/or kV according to patient size and/or use of iterative  reconstruction technique. COMPARISON:  March 22, 2024 FINDINGS: Lower chest: Marked severity posterior right lower lobe consolidation is seen. Small bilateral pleural effusions are also noted, right greater than left. Hepatobiliary: No focal liver abnormality is seen. No gallstones, gallbladder wall thickening, or biliary dilatation. Pancreas: Unremarkable. No pancreatic ductal dilatation or surrounding inflammatory changes. Spleen: Normal in size without focal abnormality. Adrenals/Urinary Tract: Adrenal glands are unremarkable. Kidneys are normal in size, without renal calculi or focal lesions. A right-sided endo ureteral stent is noted. Stomach/Bowel: Enteric tube is seen. It travels through the gastric body, gastric antrum and duodenal bulb. Its distal tip extends approximately 8 mm into the wall of superior part of the proximal duodenum, without evidence of complete perforation. Less than 1 mm of duodenal wall is seen in between the distal tip and adjacent mesenteric fat. Marked severity mural thickening of the duodenal bulb and proximal duodenum is seen. The appendix is not identified. Dilated air-filled loops of bowel are seen within the anterior aspect of the mid and upper abdomen. Vascular/Lymphatic: Aortic atherosclerosis. No enlarged abdominal lymph nodes. Other: No abdominal wall hernia or abnormality. Musculoskeletal: Multilevel postoperative and degenerative changes seen throughout the visualized portion of the lumbar spine. IMPRESSION: 1. Enteric tube positioning, as described above, which extends into the wall of the proximal duodenum without evidence of complete perforation. 2. Marked severity mural thickening of the duodenal bulb and proximal duodenum, which may represent sequelae associated with duodenitis. 3. Dilated air-filled loops of bowel within the anterior aspect of the mid and upper abdomen, which may represent ileus. 4. Marked severity posterior right lower lobe consolidation. 5. Small  bilateral pleural effusions, right greater than left. 6. Right-sided endo ureteral stent. 7. Aortic atherosclerosis. Electronically Signed   By: Suzen Dials M.D.   On: 03/27/2024 19:21   DG Abd 1 View Result Date: 03/27/2024 CLINICAL DATA:  [id number] Nasogastric tube present [id number] EXAM: ABDOMEN - 1 VIEW COMPARISON:  March 27, 2024 7:13 a.m. FINDINGS: Esophagogastric tube courses below the diaphragm with the distal tip overlying the right upper quadrant. Gaseous distension of a few segments of small bowel and colon in the upper abdomen. No pneumoperitoneum. Thoracolumbar fusion hardware partially visualized. The lung bases are clear. IMPRESSION: Esophagogastric tube courses below the diaphragm with the distal tip overlying the right upper quadrant. While this could be within the gastric antrum or proximal duodenum, this seems further lateral than would be expected and a repeat KUB with enteric contrast is recommended to confirm tube intraluminal positioning. These results will be called to the ordering clinician or representative by the Radiologist Assistant and communication documented in the PACS or [employer]. Electronically Signed   By: Rogelia Myers M.D.   On: 03/27/2024 17:38   DG Abd 2 Views Result Date: 03/27/2024 CLINICAL DATA:  Ileus. EXAM: ABDOMEN - 2 VIEW COMPARISON:  March 26, 2024. FINDINGS: Distal tip of nasogastric tube appears to be looped within proximal stomach. Postsurgical changes are seen involving lower thoracic and lumbar spine. Right ureteral stent is again noted. Stable diffuse colonic dilatation is noted suggesting ileus. IMPRESSION: Stable diffuse colonic dilatation is noted suggesting ileus. Electronically Signed   By: Lynwood Landy Raddle M.D.   On: 03/27/2024 09:18   DG Abd Portable 1V Result Date: 03/26/2024 CLINICAL DATA:  [id number] Nasogastric tube present [id number] EXAM: PORTABLE ABDOMEN - 1 VIEW COMPARISON:  None Available. FINDINGS: Partially visualized  tube-likely an  enteric tube with tip overlying the expected region of the gastric lumen. Side port collimated off view. Persistent gaseous distension of the bowel, likely colonic. No radio-opaque calculi or other significant radiographic abnormality are seen. Thoracolumbar sacral iliac surgical artery. IMPRESSION: 1. Partially visualized tube-likely an enteric tube with tip overlying the expected region of the gastric lumen. Side port collimated off view. Recommend chest x-ray for further evaluation. 2. Persistent gaseous distension of the bowel, likely colonic. Electronically Signed   By: Morgane  Naveau M.D.   On: 03/26/2024 20:02   DG Abd 2 Views Result Date: 03/26/2024 CLINICAL DATA:  Abdominal distension EXAM: ABDOMEN - 2 VIEW COMPARISON:  CT 03/22/2024. FINDINGS: Upright supine views. The upright view includes the lower chest and upper abdomen. No free intraperitoneal air. Moderate right hemidiaphragm elevation. Thoracolumbar spine fixation. Supine views demonstrate gaseous distension of bowel, primarily felt to represent colon. Compared to the scout view of the 03/22/2024 CT, mildly progressive. Right ureteric stent. IMPRESSION: Diffuse gaseous distension of bowel, primarily felt to be colonic. Mildly progressive since 03/22/2024 CT. Favor colonic ileus or partial obstruction from fecal impaction, when correlated with CT. Electronically Signed   By: Rockey Kilts M.D.   On: 03/26/2024 14:06   US  Venous Img Lower Unilateral Left (DVT) Result Date: 03/22/2024 CLINICAL DATA:  Left leg swelling EXAM: LEFT LOWER EXTREMITY VENOUS DOPPLER ULTRASOUND TECHNIQUE: Gray-scale sonography with compression, as well as color and duplex ultrasound, were performed to evaluate the deep venous system(s) from the level of the common femoral vein through the popliteal and proximal calf veins. COMPARISON:  None Available. FINDINGS: VENOUS Normal compressibility of the common femoral, superficial femoral, and popliteal veins, as well as the  visualized calf veins. Visualized portions of profunda femoral vein and great saphenous vein unremarkable. No filling defects to suggest DVT on grayscale or color Doppler imaging. Doppler waveforms show normal direction of venous flow, normal respiratory plasticity and response to augmentation. Limited views of the contralateral common femoral vein are unremarkable. OTHER None. Limitations: none IMPRESSION: Negative. Electronically Signed   By: Waddell Calk M.D.   On: 03/22/2024 10:54   CT ABDOMEN PELVIS WO CONTRAST Result Date: 03/22/2024 CLINICAL DATA:  Chronic right UPJ stent. Sepsis. Evaluate for source of infection. EXAM: CT ABDOMEN AND PELVIS WITHOUT CONTRAST TECHNIQUE: Multidetector CT imaging of the abdomen and pelvis was performed following the standard protocol without IV contrast. RADIATION DOSE REDUCTION: This exam was performed according to the departmental dose-optimization program which includes automated exposure control, adjustment of the mA and/or kV according to patient size and/or use of iterative reconstruction technique. COMPARISON:  10/19/2022 FINDINGS: Lower chest: No pleural fluid or consolidative change. Small nodules within the left base are similar measuring up to 4 mm, image 10/4. Hepatobiliary: No focal liver abnormality. Gallbladder appears normal. Pancreas is suboptimally visualized. Pancreas: Atrophic appearance of the body and tail of pancreas as before. No signs of inflammation or mass. Spleen: Normal in size without focal abnormality. Adrenals/Urinary Tract: Normal adrenal glands. There is a right-sided nephroureteral stent in place. Right-sided pelviectasis is identified. The right renal pelvis measures 2.2 cm on today's study versus 3.2 cm on the previous exam. No signs of high-grade obstructive uropathy. Left kidney is unremarkable. Urinary bladder is decompressed around a Foley catheter balloon. Distal portion of the right ureteral stent is in the decompressed bladder.  Stomach/Bowel: Stomach appears within normal limits. No pathologic dilatation of the large or small bowel loops. The appendix is not confidently identified. No  pericecal inflammation. There is a moderate stool burden identified within the ascending and proximal transverse colon. Large volume of desiccated stool is noted within the distended rectum. There is gaseous distension of the left colon proximal to this level. Vascular/Lymphatic: Aortic atherosclerosis without aneurysm. No signs of abdominopelvic adenopathy. Reproductive: Status post hysterectomy. No adnexal masses. Other: No free fluid or fluid collections. No signs of pneumoperitoneum. Musculoskeletal: Postoperative changes identified within the lower thoracic, lumbar spine and sacrum. On multilevel lumbar spondylosis noted. No acute osseous pathology. IMPRESSION: 1. No acute findings within the abdomen or pelvis. 2. Right-sided nephroureteral stent in place. Right-sided pelviectasis is identified. The right renal pelvis measures 2.2 cm on today's study versus 3.2 cm on the previous exam. No signs of high-grade obstructive uropathy. 3. Large volume of desiccated stool is noted within the distended rectum. There is gaseous distension of the left colon proximal to this level. Correlate for any clinical signs or symptoms of constipation or fecal impaction. 4. Small nodules within the left base are similar measuring up to 4 mm. 5.  Aortic Atherosclerosis (ICD10-I70.0). Electronically Signed   By: Waddell Calk M.D.   On: 03/22/2024 10:22   DG Chest 2 View Result Date: 03/22/2024 CLINICAL DATA:  Sepsis, unclear source EXAM: CHEST - 2 VIEW COMPARISON:  04/28/2022. FINDINGS: Cardiac silhouette is unremarkable. No pneumothorax or pleural effusion. Linear basilar subsegmental atelectasis on the left. Lungs otherwise clear. Aorta is calcified. Thoracic degenerative changes and thoracolumbar spine fixation hardware noted. IMPRESSION: Left basilar subsegmental  atelectasis. Electronically Signed   By: Fonda Field M.D.   On: 03/22/2024 10:13    Microbiology: No results found for this or any previous visit (from the past 240 hours).  Time spent: 40 minutes  Signed: Aimee Somerset, MD 04/08/24

## 2024-05-01 DEATH — deceased
